# Patient Record
Sex: Female | Born: 1955 | Race: White | Hispanic: No | Smoking: Current every day smoker
Health system: Southern US, Community
[De-identification: ages and names within clinical notes are randomized; demographics above are authoritative.]

## PROBLEM LIST (undated history)

## (undated) DIAGNOSIS — I499 Cardiac arrhythmia, unspecified: Secondary | ICD-10-CM

## (undated) DIAGNOSIS — R51 Headache: Secondary | ICD-10-CM

## (undated) DIAGNOSIS — R112 Nausea with vomiting, unspecified: Secondary | ICD-10-CM

## (undated) DIAGNOSIS — I82409 Acute embolism and thrombosis of unspecified deep veins of unspecified lower extremity: Secondary | ICD-10-CM

## (undated) DIAGNOSIS — F32A Depression, unspecified: Secondary | ICD-10-CM

## (undated) DIAGNOSIS — Z950 Presence of cardiac pacemaker: Secondary | ICD-10-CM

## (undated) DIAGNOSIS — F319 Bipolar disorder, unspecified: Secondary | ICD-10-CM

## (undated) DIAGNOSIS — I455 Other specified heart block: Secondary | ICD-10-CM

## (undated) DIAGNOSIS — R42 Dizziness and giddiness: Secondary | ICD-10-CM

## (undated) DIAGNOSIS — C539 Malignant neoplasm of cervix uteri, unspecified: Secondary | ICD-10-CM

## (undated) DIAGNOSIS — Z720 Tobacco use: Secondary | ICD-10-CM

## (undated) DIAGNOSIS — I2699 Other pulmonary embolism without acute cor pulmonale: Secondary | ICD-10-CM

## (undated) DIAGNOSIS — F419 Anxiety disorder, unspecified: Secondary | ICD-10-CM

## (undated) DIAGNOSIS — Z9889 Other specified postprocedural states: Secondary | ICD-10-CM

## (undated) DIAGNOSIS — F329 Major depressive disorder, single episode, unspecified: Secondary | ICD-10-CM

## (undated) DIAGNOSIS — R55 Syncope and collapse: Secondary | ICD-10-CM

## (undated) HISTORY — DX: Syncope and collapse: R55

## (undated) HISTORY — PX: TONSILLECTOMY: SUR1361

## (undated) HISTORY — PX: BACK SURGERY: SHX140

## (undated) HISTORY — DX: Other specified heart block: I45.5

## (undated) HISTORY — PX: POSTERIOR LUMBAR FUSION: SHX6036

## (undated) HISTORY — DX: Depression, unspecified: F32.A

## (undated) HISTORY — DX: Anxiety disorder, unspecified: F41.9

## (undated) HISTORY — PX: CERVICAL FUSION: SHX112

## (undated) HISTORY — DX: Dizziness and giddiness: R42

## (undated) HISTORY — DX: Tobacco use: Z72.0

---
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
## 1980-03-19 DIAGNOSIS — C539 Malignant neoplasm of cervix uteri, unspecified: Secondary | ICD-10-CM

## 1980-03-19 HISTORY — DX: Malignant neoplasm of cervix uteri, unspecified: C53.9

## 1980-03-19 HISTORY — PX: ABDOMINAL HYSTERECTOMY: SHX81

## 2005-09-11 ENCOUNTER — Encounter: Admission: RE | Admit: 2005-09-11 | Discharge: 2005-09-11 | Payer: Self-pay

## 2007-07-16 ENCOUNTER — Other Ambulatory Visit: Admission: RE | Admit: 2007-07-16 | Discharge: 2007-07-16 | Payer: Self-pay | Admitting: Family Medicine

## 2008-01-09 ENCOUNTER — Ambulatory Visit: Payer: Self-pay | Admitting: Family Medicine

## 2008-01-10 ENCOUNTER — Encounter: Payer: Self-pay | Admitting: Family Medicine

## 2008-01-15 ENCOUNTER — Ambulatory Visit (HOSPITAL_COMMUNITY): Admission: RE | Admit: 2008-01-15 | Discharge: 2008-01-16 | Payer: Self-pay | Admitting: Orthopedic Surgery

## 2008-03-15 ENCOUNTER — Encounter: Admission: RE | Admit: 2008-03-15 | Discharge: 2008-03-15 | Payer: Self-pay | Admitting: Orthopedic Surgery

## 2008-07-26 ENCOUNTER — Ambulatory Visit: Payer: Self-pay | Admitting: Family

## 2008-07-27 ENCOUNTER — Encounter: Payer: Self-pay | Admitting: Family

## 2008-07-27 LAB — CONVERTED CEMR LAB
Clue Cells Wet Prep HPF POC: NONE SEEN
Trich, Wet Prep: NONE SEEN
Yeast Wet Prep HPF POC: NONE SEEN

## 2008-12-20 ENCOUNTER — Encounter: Admission: RE | Admit: 2008-12-20 | Discharge: 2008-12-20 | Payer: Self-pay | Admitting: Orthopedic Surgery

## 2009-05-09 ENCOUNTER — Encounter: Admission: RE | Admit: 2009-05-09 | Discharge: 2009-05-09 | Payer: Self-pay | Admitting: Orthopedic Surgery

## 2009-05-09 ENCOUNTER — Ambulatory Visit: Payer: Self-pay | Admitting: Occupational Medicine

## 2009-05-09 ENCOUNTER — Ambulatory Visit: Payer: Self-pay | Admitting: Advanced Practice Midwife

## 2009-05-09 DIAGNOSIS — M5430 Sciatica, unspecified side: Secondary | ICD-10-CM | POA: Insufficient documentation

## 2009-05-10 ENCOUNTER — Encounter: Payer: Self-pay | Admitting: Obstetrics and Gynecology

## 2009-11-14 ENCOUNTER — Ambulatory Visit: Payer: Self-pay | Admitting: Family Medicine

## 2009-11-14 DIAGNOSIS — H9209 Otalgia, unspecified ear: Secondary | ICD-10-CM | POA: Insufficient documentation

## 2009-11-14 DIAGNOSIS — H8110 Benign paroxysmal vertigo, unspecified ear: Secondary | ICD-10-CM | POA: Insufficient documentation

## 2009-11-14 DIAGNOSIS — H6120 Impacted cerumen, unspecified ear: Secondary | ICD-10-CM | POA: Insufficient documentation

## 2009-11-14 DIAGNOSIS — F411 Generalized anxiety disorder: Secondary | ICD-10-CM | POA: Insufficient documentation

## 2010-02-21 ENCOUNTER — Ambulatory Visit: Payer: Self-pay | Admitting: Obstetrics & Gynecology

## 2010-02-28 ENCOUNTER — Encounter
Admission: RE | Admit: 2010-02-28 | Discharge: 2010-02-28 | Payer: Self-pay | Source: Home / Self Care | Attending: Obstetrics & Gynecology | Admitting: Obstetrics & Gynecology

## 2010-04-18 NOTE — Assessment & Plan Note (Signed)
Summary: LBP - right side x 4 dys rm 3   Vital Signs:  Patient Profile:   55 Years Old Female CC:      LBP - R side x 4 dys Height:     68 inches Weight:      119 pounds O2 Sat:      100 % O2 treatment:    Room Air Temp:     97.5 degrees F oral Pulse rate:   74 / minute Pulse rhythm:   regular Resp:     16 per minute BP sitting:   126 / 85  (right arm) Cuff size:   regular  Vitals Entered By: Areta Haber CMA (May 09, 2009 2:37 PM)                  Current Allergies: No known allergies History of Present Illness Chief Complaint: LBP - R side x 4 dys History of Present Illness: Presents with complaints of right SI joint pain for about 4 days.  She does have some right leg pain that radiates into her right lateral calf intermittently.   Pain has gotten progressively worse.  Pain is worse with motion.   Better with lying down.  No history of fall or trauma.   She says several days ago she was painting and thinks she may have pulled something.    She does have a history of lumbar fusion done by Dr. Shon Baton in 10/09.    Takes no medications regularly for pain.     She does have a history of anxieth and panic attacks.  Takes vallium, paxil, and ambien.    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  Take PC CYCLOBENZAPRINE HCL 10 MG TABS (CYCLOBENZAPRINE HCL) one tablet at bedtime for muscle spasms  REVIEW OF SYSTEMS Constitutional Symptoms      Denies fever, chills, night sweats, weight loss, weight gain, and fatigue.  Eyes       Denies change in vision, eye pain, eye discharge, glasses, contact lenses, and eye surgery. Ear/Nose/Throat/Mouth       Denies hearing loss/aids, change in hearing, ear pain, ear discharge, dizziness, frequent runny nose,  frequent nose bleeds, sinus problems, sore throat, hoarseness, and tooth pain or bleeding.  Respiratory       Denies dry cough, productive cough, wheezing, shortness of breath, asthma, bronchitis, and emphysema/COPD.  Cardiovascular       Denies murmurs, chest pain, and tires easily with exhertion.    Gastrointestinal       Denies stomach pain, nausea/vomiting, diarrhea, constipation, blood in bowel movements, and indigestion. Genitourniary       Denies painful urination, kidney stones, and loss of urinary control. Neurological       Denies paralysis, seizures, and fainting/blackouts. Musculoskeletal       Complains of decreased range of motion.      Denies muscle pain, joint pain, joint stiffness, redness, swelling, muscle weakness, and gout.      Comments: LBP - R side x 4 dys Skin       Denies bruising, unusual mles/lumps or sores, and hair/skin or nail changes.  Psych       Denies mood changes, temper/anger issues, anxiety/stress, speech problems, depression, and sleep problems. Other Comments:  Pt states that she was painting a room in her home and starting feeling sore on Wednesday night 05/04/09. The pain has gradually gotten worse.    Past History:  Past Medical History: Last updated: 01/09/2008 Spinal problems  Past Surgical History: Last updated: 01/09/2008 Partial Hysterectomy  Family History: Last updated: 01/09/2008 Mother, Alive, Healthy Father, D, Eye Ca Sister, Alive, Healthy Brother, Alive, Healthy  Social History: Last updated: 01/09/2008 1/2PPD for 30 yrs Alcohol use-no Drug use-no Physical Exam General appearance: well developed, well nourished, no acute distress Chest/Lungs: no rales, wheezes, or rhonchi bilateral, breath sounds equal without effort Heart: regular rate and  rhythm, no murmur Back: tender musculature right lower back, straight leg raises negative on the left.  POSITIVE on the right, deep tendon reflexes 2+ at achilles and patella on  the left and right patella.   Slightly diminished achilles reflex on the right.   Normal senasation.  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  Take PC  #14 x 0, 05/09/2009, Kathrine Haddock MD  New Orders: New Patient Level III [99203] Ketorolac-Toradol 15mg  [G2952] Admin of Therapeutic Inj  intramuscular or subcutaneous [96372] Planning Comments:   No signs or symptoms of immediate neurological compromise Toradol Injection 60mg  IM now Prednisone taper Flexeril at beditme Follow up with Dr. Shon Baton next week.   The patient and/or caregiver has been counseled thoroughly with regard to medications prescribed including dosage, schedule, interactions, rationale for use, and possible side effects and they verbalize understanding.  Diagnoses and expected course of recovery discussed and will return if not improved as expected or if the condition worsens. Patient and/or caregiver verbalized understanding.  Prescriptions: CYCLOBENZAPRINE HCL 10 MG TABS (CYCLOBENZAPRINE HCL) one tablet at bedtime for muscle spasms  #20 x 0   Entered and Authorized by:   Kathrine Haddock MD   Signed by:   Kathrine Haddock MD on 05/09/2009   Method used:   Print then Give to Patient   RxID:   [id number] PREDNISONE 10 MG TABS (PREDNISONE) 2 PO BID for 2 days, then 1 BID for 2 days, then 1 daily for 2 days.  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

## 2010-04-18 NOTE — Assessment & Plan Note (Signed)
Summary: Ear pain- R worse x 3 wks, Nausea, dizzy x 2 dys rm 3   Vital Signs:  Patient Profile:   55 Years Old Female CC:      Ear pain - B, x 3 wksNausea, dizzy x 2 dys Height:     67 inches Weight:      120 pounds O2 Sat:      100 % O2 treatment:    Room Air Temp:     98.6 degrees F oral Pulse rate:   76 / minute Pulse rhythm:   regular Resp:     16 per minute BP sitting:   115 / 77  (left arm) Cuff size:   small  Vitals Entered By: Areta Haber CMA (November 14, 2009 6:45 PM)                  Current Allergies: No known allergies History of Present Illness Chief Complaint: Ear pain - B, x 3 wksNausea, dizzy x 2 dys History of Present Illness:  Subjective:  Patient complains of 3 week history of right ear feeling clogged, then 3 days ago she developed increasing pain in the ear.  Yesterday she developed dizziness (sensation of spinning).  She also has had sweats.  No nasal congestion or sore throat.  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  Eyes       Denies change in vision, eye pain, eye discharge, glasses, contact lenses, and eye surgery. Ear/Nose/Throat/Mouth       Complains of ear pain and dizziness.      Denies hearing loss/aids, change in hearing, ear discharge, frequent runny nose, frequent nose bleeds, sinus problems, sore throat, hoarseness, and tooth pain or bleeding.      Comments:  B, R worse x 3 wks Respiratory       Denies dry cough, productive cough, wheezing, shortness of breath, asthma, bronchitis, and emphysema/COPD.  Cardiovascular       Denies murmurs, chest pain, and tires easily with exhertion.    Gastrointestinal       Complains of nausea/vomiting.      Denies stomach pain, diarrhea, constipation, blood in bowel movements, and indigestion.      Comments: Nausea x 2 dys Genitourniary       Denies painful urination, kidney stones, and loss of urinary control. Neurological       Denies paralysis, seizures, and fainting/blackouts. Musculoskeletal       Denies muscle pain, joint pain, joint stiffness, decreased range of motion, redness, swelling, muscle weakness, and gout.  Skin       Denies bruising, unusual mles/lumps or sores, and hair/skin or nail changes.  Psych       Denies mood changes, temper/anger issues, anxiety/stress, speech problems, depression, and sleep problems. Other Comments: Pt has not seen her PCP for this.   Past History:  Past Surgical History: Last updated: 01/09/2008 Partial Hysterectomy  Family History: Last updated: 01/09/2008  Mother, Alive, Healthy Father, D, Eye Ca Sister, Alive, Healthy Brother, Alive, Healthy  Social History: Last updated: 11/14/2009 1/2PPD for 30 yrs Alcohol use-no Drug use-no Current Smoker - 1 pack daily Regular exercise-no  Past Medical History: Spinal problems Anxiety  Social History: 1/2PPD for 30 yrs Alcohol use-no Drug use-no Current Smoker - 1 pack daily Regular exercise-no Smoking Status:  current Does Patient Exercise:  no   Objective:  No acute distress  Eyes:  Pupils are equal, round, and reactive to light and accomdation.  Extraocular movement is intact.  Conjunctivae are not inflamed.  Fundi benign.  No nystagmus Ears:  Left tympanic membrane and canal normal.  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  Unable to visualize right tympanic membrane.   No temporomandibular joint tenderness Nose:  Normal septum.  Normal turbinates, mildly congested.   No sinus tenderness present.  Pharynx:  Normal  Neck:  Supple.  No adenopathy is present.   Assessment New Problems: BENIGN POSITIONAL VERTIGO (ICD-386.11) CERUMEN IMPACTION, RIGHT (ICD-380.4) EAR PAIN, RIGHT (ICD-388.70) ANXIETY (ICD-300.00)  SUSPECT RIGHT OTITIS MEDIA BEHIND CERUMIN IMPACTION  Plan New Medications/Changes: MECLIZINE HCL 25 MG TABS (MECLIZINE HCL) One by mouth two times a day to three times a day as needed for dizziness  #15 x 1, 11/14/2009, Donna Christen MD AMOXICILLIN 875 MG TABS (AMOXICILLIN) One by mouth two times a day  #20 x 0, 11/14/2009, Donna Christen MD  New Orders: Est. Patient Level III [phone] Planning Comments:   Begin amoxicillin and meclizine.   Return in 4 to 5 days for ear lavage when pain has resolved.  Return for worsening symptoms   The patient and/or caregiver has been counseled thoroughly with regard to medications prescribed including dosage, schedule, interactions, rationale for use, and possible side effects and they verbalize understanding.  Diagnoses and expected course of recovery discussed and will return if not improved as expected or if the condition worsens. Patient and/or caregiver verbalized understanding.  Prescriptions: MECLIZINE HCL 25 MG TABS (MECLIZINE HCL) One by mouth two times a day to three times a day as needed for dizziness  #15 x 1   Entered and Authorized by:   Donna Christen MD   Signed by:   Donna Christen MD on 11/14/2009   Method used:   Print then Give to Patient   RxID:   [id number] AMOXICILLIN 875 MG TABS (AMOXICILLIN) One by mouth two times a day  #20 x 0   Entered and Authorized by:   Donna Christen MD   Signed by:   Donna Christen MD on 11/14/2009   Method used:   Print then Give to Patient   RxID:   [id number]   Orders Added: 1)  Est. Patient Level III [29528]  Appended Document: Ear pain- R  worse x 3 wks, Nausea, dizzy x 2 dys rm 3 F/U cll to pt  - Pt states dizzy is no better, have not had ears cleaned yet. Pt states that Dr. Laurey Morale told her that she could come in Saturday to have ears cleaned. Pt advised to f/u w/ PCP since her dizzy is no better, to have her ears looked at [facility] if necessary) and for possible referral to ENT. Pt states she would call her PCP tomorrow.

## 2010-04-18 NOTE — Assessment & Plan Note (Signed)
Summary: BLADDER INFECTION   Vital Signs:  Patient Profile:   55 Years Old Female CC:      Grequency in urination X 2 days Height:     68 inches Weight:      126 pounds O2 Sat:      99 % O2 treatment:    Room Air Temp:     97.3 degrees F oral Pulse rate:   68 / minute Pulse rhythm:   regular Resp:     16 per minute BP sitting:   127 / 85  (left arm) Cuff size:   regular  Pt. in pain?   no                   Current Allergies (reviewed today): No known allergies  History of Present Illness Chief Complaint: Grequency in urination X 2 days   REVIEW OF SYSTEMS Constitutional Symptoms      Denies fever, chills, night sweats, weight loss, weight gain, and fatigue.  Eyes       Denies change in vision, eye pain, eye discharge, glasses, contact lenses, and eye surgery. Ear/Nose/Throat/Mouth       Denies hearing loss/aids, change in hearing, ear pain, ear discharge, dizziness, frequent runny nose, frequent nose bleeds, sinus problems, sore throat, hoarseness, and tooth pain or bleeding.  Respiratory       Denies dry cough, productive cough, wheezing, shortness of breath, asthma, bronchitis, and emphysema/COPD.  Cardiovascular       Denies murmurs, chest pain, and tires easily with exhertion.    Gastrointestinal       Denies stomach pain, nausea/vomiting, diarrhea, constipation, blood in bowel movements, and indigestion. Genitourniary       Denies painful urination, kidney stones, and loss of urinary control.      Comments: Frequency Neurological       Denies paralysis, seizures, and fainting/blackouts. Musculoskeletal       Denies muscle pain, joint pain, joint stiffness, decreased range of motion, redness, swelling, muscle weakness, and gout.  Skin       Denies bruising, unusual mles/lumps or sores, and hair/skin or nail changes.  Psych       Denies mood changes, temper/anger issues, anxiety/stress, speech problems, depression, and sleep problems.  Past Medical  History:    Spinal problems  Past Surgical History:    Partial Hysterectomy   Family History:    Mother, Alive, Healthy    Father, D, Eye Ca    Sister, Alive, Healthy    Brother, Alive, Healthy  Social History:    1/2PPD for 30 yrs    Alcohol use-no    Drug use-no      Plan  The patient and/or caregiver has been counseled thoroughly with regard to medications prescribed including dosage, schedule, interactions, rationale for use, and possible side effects and they verbalize understanding.  Diagnoses and expected course of recovery discussed and will return if not improved as expected or if the condition worsens. Patient and/or caregiver verbalized understanding.     ] ]

## 2010-07-11 HISTORY — PX: TRANSTHORACIC ECHOCARDIOGRAM: SHX275

## 2010-07-18 ENCOUNTER — Ambulatory Visit: Payer: Self-pay | Admitting: Obstetrics and Gynecology

## 2010-08-01 NOTE — Assessment & Plan Note (Signed)
[name], [name]                ACCOUNT NO.:  [id number]   MEDICAL RECORD NO.:  [id number]          PATIENT TYPE:  POB   LOCATION:  [facility]         FACILITY:  [facility]   PHYSICIAN:  Sid Falcon, CNM  DATE OF BIRTH:  September 03, 1955   DATE OF SERVICE:  07/26/2008                                  CLINIC NOTE   The patient is here with a chief complaint of vaginal discharge with  odor x3 days.   CURRENT MEDICATIONS:  Paxil and Ambien 12.5 at bedtime.   MENSTRUAL HISTORY:  Menarche 55 years of age.   GYNECOLOGIC HISTORY:  Last Pap smear in 2009, history of abnormal Pap  smears, yes.  The patient reports questionable cervical cancer and had a  partial hysterectomy.  Ovaries are intact.  Last mammogram greater than  5 years ago and was normal.  Breast exam last year with [facility].   OBSTETRICAL HISTORY:  Gravida 3, para 3.   FAMILY HISTORY:  Maternal grandfather, diabetes.  Maternal grandfather,  heart disease, high blood pressure.  Maternal grandfather, cancer.  Maternal aunt, less than 17 years of age.  Denies any other significant  health problems in the family.   MEDICAL HISTORY:  Questionable cervical cancer.  No other medical  conditions identified or noted when questioned.   SOCIAL HISTORY:  The patient is adopted by her mother's sister, so her  maternal aunt adopted her as an infant.  Smokes approximately 1 pack of  cigarettes per day x30 years.  Caffeinated beverages, approximately 6  Pepsis per day.  Denies any alcohol.  Denies history of sexual or  physical abuse.   REVIEW OF SYSTEMS:  The patient reports intermittent numbness of  fingers, no abnormal hand lesions.  Muscle aches, fatigue, and weight  gain 6 months ago.  Does not experience any of these today.   PHYSICAL EXAMINATION:  GENERAL:  Alert and oriented x3.  No signs of  acute distress.  PELVIC:  White creamy discharge noted at introitus.  No lesions.  No  abnormal redness.  Vaginal  canal, no abnormal lesions.  White adherent  discharge throughout.  Vaginal cuff intact.   LABORATORY DATA:  Wet prep.   ASSESSMENT:  Abnormal vaginal discharge.   PLAN:  Prescription given for metronidazole 500 mg 1 pill b.i.d. x7 days  to be filled when reports of vaginal discharge returned.  I advised the  patient to schedule a mammogram.  The patient verbalizes will do as soon  as  possible.  Also, advised for a followup well-woman exam here at the  office, and the patient verbalized will schedule ASAP.  Follow up sooner  if concerns prior to well-woman exam.      Sid Falcon, CNM     WM/MEDQ  D:  07/26/2008  T:  07/27/2008  Job:  [id number]

## 2010-08-01 NOTE — Op Note (Signed)
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   COMPLICATIONS:  None.   INSTRUMENTATION USED:  Metal-hydroxyapatite coated, size 11, 33-mm long  interbody TLIF cage with the [employer] Radius pedicle screw fixation on  the right side, 40-mm pedicle screw 6.25 diameter with a 50-mm rod and a  facet screw on the contralateral side, 30 mm in length.   HISTORY:  [name] is a very pleasant 55 year old woman who has been  having significant back and right leg pain for sometime now.  Despite  physical therapy, injection therapy, and narcotic medications, she is  still having horrific pain.  She presented to my office because of the  severity of her symptoms.  Having failed prolonged conservative  management, she elected to proceed with surgery.  All appropriate risks,  benefits, and alternatives were discussed and consent was obtained.   OPERATIVE NOTE:  The patient was brought to the operating room and  placed supine on the operating table.  After successful induction of  general anesthesia and endotracheal intubation, TEDs, SCDs, and Foley  were applied.  The arms were secured to the side.  The spinal frame was  secured over the patient.  She was turned into the prone position.   The arms were placed overhead.  All bony prominences  were well padded  and the back was prepped and draped in a standard fashion.   Fluoro was used to identify the appropriate incision site and a midline  lumbar incision was made starting at the superior aspect of L3 and  proceeding to the superior aspect of S1.  Sharp dissection was carried  out down to the deep fascia.  Deep fascia was sharply incised and then  using a Cobb elevator, I stripped the paraspinal muscles to expose the  L4 and L5 spinous processes as well as the L3-4 and L4-5 facet  complexes.  Once I had the facet complexes exposed, I then exposed out  to the L4 transverse and L5 transverse processes.  At this point, since  her right side was the most symptomatic side, this was elected to do the  TLIF decompression out.  Once I exposed this right hand side, I then  just exposed out to the L4-5 facet complex on the contralateral side.   At this point, I then used fluoro to guide my screw placement.  I first  identified the transverse process, traced its origin back to where it  intersected  with the facet complex and scored the cortex.  I then  advanced the pedicle probe through the pedicle and into the vertebral  body.  I repeated this at the L5 level.  AP and lateral x-rays at this  time with pedicle probes in place demonstrated satisfactory position.  I  then removed the pedicle probes, palpated with a ball-tip feeler and  then tapped.  I then palpated again with ball-tip feeler to ensure that  I had a solid bony canal.  With the solid bony canal at both the L4 and  L5 places, I placed a 40-mm, 6.25 diameter pedicle screws at both  levels.  The patient tolerated this well and repeat AP and lateral views  were satisfactory.  With the pedicle screw fixation complete, I then  proceeded with decompression.   I removed the complete L5 spinous process as well as the inferior  portion of the L4 spinous process.  I then performed a complete L5  central decompression by removing  the entire performing a laminectomy of  L5.  Once I had the central decompression completed, I then proceeded  out laterally.  I then took an osteotome and I resected the entire L4  inferior facet complex.  With the facet removed, I then developed a  plane underneath the pars of L4 and resected the entire pars of L4.  At  this point, I could clearly visualize the exiting L4 nerve root as well  as the traversing L5 nerve root.  I then trimmed down the superior facet  complex of L5 until I could visualize the medial wall of the pedicle.  I  then performed a generous foraminotomy of L5.  I can now directly  visualize the inferior and medial wall of the L4 and L5 pedicle and I  palpated them and directly looked at them to again confirm that the  pedicle screw that I had placed did not violate the pedicle.  At this  point, I also could clearly visualize the L4-5 disk space.  I then swept  the thecal sac, traversing L5 nerve root medially, and placed a Neuro-  Patty to protect the exiting L4 nerve root.  I then obtained hemostasis  by bipolar electrocautery.  Once the epidural veins were cauterized, I  then incised the L4-5 disk complex to perform the diskectomy.   Using a combination of pituitary rongeurs, curettes, and Kerrison  rongeurs, I resected the entire L4-5 disk.  I then at this point with  the diskectomy complete, I then distracted the interbody space with  paddle distractor sequentially up to a size 13.  With the interbody  space distracted, I then placed my pedicle screw distractor and locked  into the pedicle screws and maintained the distraction.  This allowed me  to distract the interbody space without stressing the pedicle screws.  I  removed the paddle and again did a more extensive diskectomy.  Once the  endplates were prepped appropriately, I then measured the interbody  space and noted that the size 12 spacer was the best fit.  I then took  Actifuse, packed it in the  interbody space, and pushed anteriorly so  that it would fit in front of the graft.  I then took the local bone  graft mixed with Actifuse and packed the TLIF cage and malleted that to  the appropriate depth and position.  Once I had the appropriate depth  and position, I then irrigated the wound copiously with normal saline,  removed the distraction, and checked the nerve roots.  At this point, I  could directly visualize the L5 and L4 nerve root.  There was no free  fragments of bone, and there was no significant undue tension on the  nerve roots.  In addition, the foraminotomies were still widely patent.  At this point, I took a 50-mm rod pre-lordosed, secured it to the  pedicle screws, compressed the construct, and locked them into position.  On the contralateral side, I identified the L4-5 facet complex, stripped  it, packed bone in the facet complex, and then drilled across the facet  into the L5 pedicle.  This was done under direct visualization and  lateral fluoroscopy.  I then measured, palpated the hole with a ball-tip  feeler, and then placed a 30-mm facet transfacet interpedicular screw.  I had excellent fixation.   At this point with the fixation complete, I irrigated again copiously  with normal saline, obtained hemostasis with bipolar electrocautery, and  then I took a nerve root hockey stick and palpated along the L4 nerve  root superiorly, centrally, and along the traversing L5 nerve root and  out the L5 neural foramen.  Again, there was no undue tension on the  thecal sac or the L4 and L5 nerve root.  At this point, I then placed  FloSeal to maintain hemostasis, closed the deep fascia with interrupted  #1 Vicryl sutures, superficial 2-0 Vicryl sutures, and a 3-0 Monocryl  for the skin.  Steri-Strips and dry dressing were applied.  She was  extubated and transferred to PACU without incident.  At the end of the  case, all needle and sponge counts were correct.       Alvy Beal, MD  Electronically Signed    DDB/MEDQ  D:  01/15/2008  T:  01/16/2008  Job:  [id number]

## 2010-08-01 NOTE — Assessment & Plan Note (Signed)
[name], [name]                ACCOUNT NO.:  [id number]   MEDICAL RECORD NO.:  [id number]          PATIENT TYPE:  POB   LOCATION:  [facility] at [place]         FACILITY:  [facility]   PHYSICIAN:  Elsie Lincoln, MD      DATE OF BIRTH:  08/05/1955   DATE OF SERVICE:  02/21/2010                                  CLINIC NOTE   The patient is a 55 year old female who presents for yearly exam.  The  patient has no complaints.   PAST MEDICAL HISTORY:  The patient has had cervical cancer.  She has had  a hysterectomy 20 years ago in [place].  She has severe anxiety.   SURGICAL HISTORY:  Back surgery, hysterectomy as well as multiple cones  before the hysterectomy.   OBSTETRICAL HISTORY:  NSVD x3.   GYNECOLOGIC HISTORY:  Last Pap smear in 2003 and again cervical cancer  20 years ago.  The patient is sexually active.  No history of ovarian  cysts.  No history of incontinence.   MEDICATIONS:  Paxil, Ambien, and Valium.   ALLERGIES:  None.   SOCIAL HISTORY:  The patient is married to the security guard that works  here at [employer].  She does not work.  She has 3  children.  She smokes one pack per day.  The patient has cut down to 2  Pepsi per day, she used to drink 6.  She has never been physically or  sexually abused.   Systemic review is negative today except for problems with anxiety.   PHYSICAL EXAM:  VITAL SIGNS:  Pulse 69, blood pressure 118/81, weight  124, height 60 inches.  GENERAL:  Well nourished, well developed, no apparent stress.  HEENT:  Normocephalic, atraumatic.  Good dentition.  Thyroid, no masses.  LUNGS:  Clear to auscultation bilaterally.  HEART:  Regular rate and rhythm.  BREASTS:  No masses, nontender.  ABDOMEN:  Soft, nontender.  No organomegaly.  No hernia.  GENITALIA:  Tanner V.  Vulva clean, no lesion.  Vagina is slightly  atrophic.  Cervix is absent.  Small nodule at the vaginal apex on  bimanual, it is flesh colored, most likely a  scar tissue.  It is at the  corner where most likely the stitches were tied.  Care was taken to make  sure the Pap smear was taken of this area.  It is flesh colored and it  stills to be at the corner of the vaginal apex.  On bimanual, the cervix  is surgically absent.  The ovaries are not palpated.  The patient  refused rectal exam.  EXTREMITIES:  No edema.   ASSESSMENT/PLAN:  A 55 year old female for well-woman exam.  1. Pap smear done.  2. Need to follow up on mammogram, which the patient states has been      done.  3. Calcium supplementation discussed.  The patient to do 1200 mg a day      with vitamin D.  4. Return to clinic in a year.           ______________________________  Elsie Lincoln, MD     KL/MEDQ  D:  02/21/2010  T:  02/22/2010  Job:  [id number]

## 2010-09-25 ENCOUNTER — Ambulatory Visit
Admission: RE | Admit: 2010-09-25 | Discharge: 2010-09-25 | Disposition: A | Discharge status: Home / Self Care | Payer: Self-pay | Source: Ambulatory Visit | Attending: Orthopedic Surgery | Admitting: Orthopedic Surgery

## 2010-09-25 ENCOUNTER — Other Ambulatory Visit: Payer: Self-pay | Admitting: Orthopedic Surgery

## 2010-09-25 DIAGNOSIS — Z9889 Other specified postprocedural states: Secondary | ICD-10-CM

## 2010-09-25 DIAGNOSIS — M545 Low back pain, unspecified: Secondary | ICD-10-CM

## 2010-12-17 ENCOUNTER — Inpatient Hospital Stay (INDEPENDENT_AMBULATORY_CARE_PROVIDER_SITE_OTHER)
Admission: RE | Admit: 2010-12-17 | Discharge: 2010-12-17 | Disposition: A | Discharge status: Home / Self Care | Payer: Federal, State, Local not specified - PPO | Source: Ambulatory Visit | Attending: Family Medicine | Admitting: Family Medicine

## 2010-12-17 ENCOUNTER — Encounter: Payer: Self-pay | Admitting: Family Medicine

## 2010-12-17 DIAGNOSIS — J209 Acute bronchitis, unspecified: Secondary | ICD-10-CM

## 2010-12-19 LAB — TYPE AND SCREEN
ABO/RH(D): A POS
Antibody Screen: NEGATIVE

## 2010-12-19 LAB — BASIC METABOLIC PANEL
BUN: 4 — ABNORMAL LOW
CO2: 25
Calcium: 9.8
Chloride: 106
Creatinine, Ser: 0.8
GFR calc Af Amer: >60
GFR calc non Af Amer: >60
Glucose, Bld: 107 — ABNORMAL HIGH
Potassium: 5.2 — ABNORMAL HIGH
Sodium: 138

## 2010-12-19 LAB — CBC
HCT: 36
HCT: 43.9
Hemoglobin: 12.6
Hemoglobin: 14.6
MCHC: 33.2
MCHC: 34.8
MCV: 91.9
MCV: 93.8
Platelets: 272
Platelets: 281
RBC: 3.92
RBC: 4.68
RDW: 13.8
RDW: 14.3
WBC: 11.3 — ABNORMAL HIGH
WBC: 21.9 — ABNORMAL HIGH

## 2010-12-19 LAB — ABO/RH: ABO/RH(D): A POS

## 2011-02-19 NOTE — Progress Notes (Signed)
Summary: COUGH/CONGESTION x 4 days (room 4)   Vital Signs:  Patient Profile:   55 Years Old Female CC:      cold and cough x 4 days Height:     67 inches Weight:      112.50 pounds O2 Sat:      99 % O2 treatment:    Room Air Temp:     97.8 degrees F oral Pulse rate:   77 / minute Resp:     16 per minute BP sitting:   144 / 93  (left arm) Cuff size:   regular  Vitals Entered By: Lavell Islam RN (December 17, 2010 3:39 PM)                  Updated Prior Medication List: AMBIEN CR 12.5 MG CR-TABS (ZOLPIDEM TARTRATE) 1 hs * ZOPHION 1 HS  Current Allergies: No known allergies History of Present Illness Chief Complaint: cold and cough x 4 days History of Present Illness:  Subjective: Patient complains of onset of URI symptoms 4 days ago with initial mild sore throat (resolved), nasal congestion, and myalgias.  She smokes 1 pack per day for the past 30 years.  She notes that she is beginning to cough until she gags, and cough is generally non-productive and worse at night.   She does not remember her last tetanus immunization.  No pleuritic pain but has mild soreness in the anterior chest No wheezing + nasal congestion + post-nasal drainage No sinus pain/pressure No itchy/red eyes No earache No hemoptysis No SOB No fever; + chills/sweats No nausea No vomiting No abdominal pain No diarrhea No skin rashes + fatigue No headache Used OTC meds without relief   REVIEW OF SYSTEMS Constitutional Symptoms       Complains of fever, chills, and fatigue.     Denies night sweats, weight loss, and weight gain.  Eyes       Denies change in vision, eye pain, eye discharge, glasses, contact lenses, and eye surgery. Ear/Nose/Throat/Mouth       Complains of frequent runny nose, sore throat, and hoarseness.      Denies hearing loss/aids, change in hearing, ear pain, ear discharge, dizziness, frequent nose bleeds, sinus problems, and tooth pain or bleeding.  Respiratory  Complains of dry cough.      Denies productive cough, wheezing, shortness of breath, asthma, bronchitis, and emphysema/COPD.  Cardiovascular       Denies murmurs, chest pain, and tires easily with exhertion.    Gastrointestinal       Denies stomach pain, nausea/vomiting, diarrhea, constipation, blood in bowel movements, and indigestion. Genitourniary       Denies painful urination, kidney stones, and loss of urinary control. Neurological       Denies paralysis, seizures, and fainting/blackouts. Musculoskeletal       Complains of muscle pain and joint pain.      Denies joint stiffness, decreased range of motion, redness, swelling, muscle weakness, and gout.  Skin       Denies bruising, unusual mles/lumps or sores, and hair/skin or nail changes.  Psych       Denies mood changes, temper/anger issues, anxiety/stress, speech problems, depression, and sleep problems. Other Comments: cough and cold symptoms x 4 days   Past History:  Past Medical History: Reviewed history from 11/14/2009 and no changes required. Spinal problems Anxiety  Past Surgical History: Partial Hysterectomy Tonsillectomy back fusion  Family History: Reviewed history from 01/09/2008 and no changes required. Mother, Alive, Healthy  Father, D, Eye Ca Sister, Alive, Healthy Brother, Alive, Healthy  Social History: Reviewed history from 11/14/2009 and no changes required. 1/2PPD for 30 yrs Alcohol use-no Drug use-no Current Smoker - 1 pack daily Regular exercise-no   Objective:  Appearance:  Patient appears healthy, stated age, and in no acute distress  Eyes:  Pupils are equal, round, and reactive to light and accomodation.  Extraocular movement is intact.  Conjunctivae are not inflamed.  Ears:  Canals normal.  Tympanic membranes normal.   Nose:  Mildly congested turbinates.  No sinus tenderness  Pharynx:  Normal  Neck:  Supple.  No adenopathy is present.  No thyromegaly is present  Lungs:  Clear to  auscultation.  Breath sounds are equal.  Chest:  Mild tenderness over sternum Heart:  Regular rate and rhythm without murmurs, rubs, or gallops.  Abdomen:  Nontender without masses or hepatosplenomegaly.  Bowel sounds are present.  No CVA or flank tenderness.  Extremities:  No edema.   Assessment New Problems: BRONCHITIS, ACUTE (ICD-466.0)  ? PERTUSSIS  Plan New Medications/Changes: BENZONATATE 200 MG CAPS (BENZONATATE) One by mouth hs as needed cough  #12 x 0, 12/17/2010, Donna Christen MD AZITHROMYCIN 250 MG TABS (AZITHROMYCIN) Two tabs by mouth on day 1, then 1 tab daily on days 2 through 5  #6 tabs x 0, 12/17/2010, Donna Christen MD  New Orders: Pulse Oximetry [94760] Est. Patient Level III [16109] Planning Comments:   Begin Z-pack, expectorant/decongestant, cough suppressant at bedtime.  Increase fluid intake Recommend Tdap when well Followup with PCP if not improving 7 to 10 days   The patient and/or caregiver has been counseled thoroughly with regard to medications prescribed including dosage, schedule, interactions, rationale for use, and possible side effects and they verbalize understanding.  Diagnoses and expected course of recovery discussed and will return if not improved as expected or if the condition worsens. Patient and/or caregiver verbalized understanding.  Prescriptions: BENZONATATE 200 MG CAPS (BENZONATATE) One by mouth hs as needed cough  #12 x 0   Entered and Authorized by:   Donna Christen MD   Signed by:   Donna Christen MD on 12/17/2010   Method used:   Print then Give to Patient   RxID:   [id number] AZITHROMYCIN 250 MG TABS (AZITHROMYCIN) Two tabs by mouth on day 1, then 1 tab daily on days 2 through 5  #6 tabs x 0   Entered and Authorized by:   Donna Christen MD   Signed by:   Donna Christen MD on 12/17/2010   Method used:   Print then Give to Patient   RxID:   [id number]   Patient Instructions: 1)  Take Mucinex D (guaifenesin with  decongestant) twice daily for congestion. 2)  Increase fluid intake, rest. 3)  May use Afrin nasal spray (or generic oxymetazoline) twice daily for about 5 days.  Also recommend using saline nasal spray several times daily and/or saline nasal irrigation. 4)  Recommend Tdap when well. 5)  Followup with family doctor if not improving 7 to 10 days.   Orders Added: 1)  Pulse Oximetry [94760] 2)  Est. Patient Level III [29528]

## 2012-06-20 ENCOUNTER — Other Ambulatory Visit: Payer: Self-pay | Admitting: Gastroenterology

## 2012-07-02 ENCOUNTER — Other Ambulatory Visit: Payer: Self-pay | Admitting: Family Medicine

## 2012-07-02 DIAGNOSIS — R634 Abnormal weight loss: Secondary | ICD-10-CM

## 2012-07-04 ENCOUNTER — Inpatient Hospital Stay: Admission: RE | Admit: 2012-07-04 | Payer: Federal, State, Local not specified - PPO | Source: Ambulatory Visit

## 2012-07-07 ENCOUNTER — Ambulatory Visit
Admission: RE | Admit: 2012-07-07 | Discharge: 2012-07-07 | Disposition: A | Discharge status: Home / Self Care | Payer: Federal, State, Local not specified - PPO | Source: Ambulatory Visit | Attending: Family Medicine | Admitting: Family Medicine

## 2012-07-07 DIAGNOSIS — R634 Abnormal weight loss: Secondary | ICD-10-CM

## 2012-07-07 MED ORDER — IOHEXOL 300 MG/ML  SOLN
75.0000 mL | Freq: Once | INTRAMUSCULAR | Status: AC | PRN
  Administered 2012-07-07: 75 mL via INTRAVENOUS

## 2012-07-10 ENCOUNTER — Encounter (HOSPITAL_COMMUNITY): Payer: Self-pay

## 2012-07-10 ENCOUNTER — Ambulatory Visit (HOSPITAL_COMMUNITY): Admit: 2012-07-10 | Payer: Federal, State, Local not specified - PPO | Admitting: Cardiovascular Disease

## 2012-07-10 SURGERY — TILT TABLE STUDY
Anesthesia: LOCAL

## 2012-10-29 ENCOUNTER — Ambulatory Visit: Payer: Federal, State, Local not specified - PPO | Admitting: Cardiology

## 2012-10-29 ENCOUNTER — Ambulatory Visit (INDEPENDENT_AMBULATORY_CARE_PROVIDER_SITE_OTHER): Payer: Federal, State, Local not specified - PPO | Admitting: Cardiology

## 2012-10-29 ENCOUNTER — Encounter: Payer: Self-pay | Admitting: Cardiology

## 2012-10-29 ENCOUNTER — Encounter: Payer: Self-pay | Admitting: Cardiovascular Disease

## 2012-10-29 VITALS — BP 112/60 | HR 89 | Ht 67.0 in | Wt 110.8 lb

## 2012-10-29 DIAGNOSIS — Z87898 Personal history of other specified conditions: Secondary | ICD-10-CM

## 2012-10-29 DIAGNOSIS — F17200 Nicotine dependence, unspecified, uncomplicated: Secondary | ICD-10-CM

## 2012-10-29 DIAGNOSIS — Z720 Tobacco use: Secondary | ICD-10-CM

## 2012-10-29 DIAGNOSIS — R55 Syncope and collapse: Secondary | ICD-10-CM

## 2012-10-29 DIAGNOSIS — R42 Dizziness and giddiness: Secondary | ICD-10-CM

## 2012-10-29 DIAGNOSIS — Z9189 Other specified personal risk factors, not elsewhere classified: Secondary | ICD-10-CM

## 2012-10-29 MED ORDER — MECLIZINE HCL 25 MG PO TABS: 25.0000 mg | ORAL_TABLET | Freq: Three times a day (TID) | ORAL | Status: DC | PRN

## 2012-10-29 NOTE — Assessment & Plan Note (Signed)
Has had neuro work up in past for same symptoms.

## 2012-10-29 NOTE — Patient Instructions (Addendum)
We will schedule a loop recorder to evaluate her heart rate which may give [place] insight as to a cause for your syncope.  You will be NPO from midnight until the test is complete the day it is scheduled.  Try the meclizine for vertigo.  Take your own Xanax the morning of the loop recorder implant.  Begin cutting back on your tobacco use. Only outside to smoke.

## 2012-10-29 NOTE — Progress Notes (Signed)
10/29/2012   PCP: Thane Edu, MD   Chief Complaint  Patient presents with  . 2 episodes of almost passing out    profuse sweating, nausea, lightheaded/dizzy, and last episode 2 weeks ago had numb/tingling in fingertips; denies CP/SOB    Primary Cardiologist: Dr. Herbie Baltimore  HPI: 57 year old white married female presents today for near syncopal episode.  This occurred approximately 2 weeks ago. She was almost passing out with diaphoresis nausea, lightheaded, dizziness. On one of these episodes she was lying on a sofa when it occurred. She also had some tingling in her left arm with this episode.  She has had a full workup by a neurologist for her vertigo they could not find any etiology. She also has had a colonoscopy and endoscopy that has been normal in the last 9 months. She denies any recent weight loss that she is very thin. She states she eats appropriately.  Other history does include extreme anxiety so much so she did not want help for tilt study. Echocardiogram in April of 2012 with normal LV size and function EF greater than 55%.    Her weight today is 1 10 lbs. 12 oz. and in April she was 106 she tells me she is eating appropriately.     Allergies  Allergen Reactions  . Penicillins Other (See Comments)    Yeast infection    Current Outpatient Prescriptions  Medication Sig Dispense Refill  . ALPRAZolam (XANAX) 0.5 MG tablet Take 0.5 mg by mouth as needed.      . mirtazapine (REMERON) 30 MG tablet Take 30 mg by mouth at bedtime.      . sertraline (ZOLOFT) 100 MG tablet Take 100 mg by mouth daily.      . meclizine (ANTIVERT) 25 MG tablet Take 1 tablet (25 mg total) by mouth 3 (three) times daily as needed.  30 tablet  0   No current facility-administered medications for this visit.    Past Medical History  Diagnosis Date  . Syncope and collapse     echo-April 12,2012-EF 55% Echo normal  . Anxiety     anxiety and mild depressive order systoms  .  Vertigo   . Tobacco abuse     Past Surgical History  Procedure Laterality Date  . Transthoracic echocardiogram  07/11/2010    The left atrial size is normal.There is no evidence of mitral vavle prolaspe.Right ventriicular systolic pressure is normal . Injection of contrast documented no interatrial shunt. Essentially normal 2D echo -doppler study   . Partial hysterectomy  01/01/ 1982  . Back surgery  10/ 01/ 2009    ZOX:WRUEAVW:UJ colds or fevers, slight weight increase Skin:no rashes or ulcers HEENT:no blurred vision, no congestion CV:see HPI PUL:see HPI GI:no diarrhea constipation or melena, no indigestion GU:no hematuria, no dysuria MS:no joint pain, no claudication Neuro:+ near syncope, + dizziness Endo:no diabetes, no thyroid disease  PHYSICAL EXAM BP 112/60  Pulse 89  Ht 5\' 7"  (1.702 m)  Wt 110 lb 12.8 oz (50.259 kg)  BMI 17.35 kg/m2 General:Pleasant affect, NAD, very thin, anxious Skin:Warm and dry, brisk capillary refill HEENT:normocephalic, sclera clear, mucus membranes moist Neck:supple, no JVD, no bruits  Heart:S1S2 RRR without murmur, gallup, rub or click Lungs:clear without rales, rhonchi, or wheezes WJX:BJYN, non tender, + BS, do not palpate liver spleen or masses Ext:no lower ext edema, 2+ pedal pulses, 2+ radial pulses Neuro:alert and oriented, MAE, follows commands, + facial symmetry, EOMs intact, upper ext. Strength with  push pull equal and strong, fingers are strong, Lower ext. push pull equal and strong.    EKG:SR, no acute EKG changes.    ASSESSMENT AND PLAN Near syncope Patient has had 2 more episodes of near syncope. The most recent she was lying on the sofa and had a feeling that came over her as if she was going to pass out.  It had been recommended she have a tilt study in the past she is very anxious about it because she will have to stop her Xanax and her other medications. Due to this episode while she's lying down, I discussed with Dr. Royann Shivers  and we will at this point implant loop recorder.  She is agreeable to the implant and she will take her Xanax prior to coming in that day with a sip of water.  If she has no arrhythmias at that point we would decide that she would need to tilt study.  Hx of syncope Has had neuro work up in past for same symptoms.  Vertigo Unknown source of her vertigo she did run out of meclizine so I refilled this medication for her.  Tobacco abuse She smokes a pack a day, she tried Chantix without any help. We discussed Nicoderm patch but she was not interested. She is expecting a grandchild in the very near future and her daughter is told her she spell out to smoke around a grandchild.  We discussed 3-5 minutes ,ways to at least decrease the tobacco use.  Asked her to keep her cigarettes her car so that she has to go outside of her home to smoke and maybe that will help at least Cut her use from one pack to half a pack a day. She will then decide which are her most tasty 7 cigarettes per day and cut back to 7 cigarettes.  Then she will decrease monthly.   She also noted that she is adopted and does not know family history except a brother that had coronary disease. Her daughter who is currently pregnant lost a child at 4 months pregnant last year and genetic testing was done.  There was some type of heart problem though the patient cannot give me any more details than that.  She was concerned that she herself may have coronary disease though she really has no chest pain no shortness of breath.  Once we were able to help her with her syncope at that point she may need a stress test to evaluate coronary artery disease. Again she was reminded that stopping tobacco would help prevent coronary artery disease.

## 2012-10-29 NOTE — Assessment & Plan Note (Signed)
Unknown source of her vertigo she did run out of meclizine so I refilled this medication for her.

## 2012-10-29 NOTE — Assessment & Plan Note (Signed)
She smokes a pack a day, she tried Chantix without any help. We discussed Nicoderm patch but she was not interested. She is expecting a grandchild in the very near future and her daughter is told her she spell out to smoke around a grandchild.  We discussed 3-5 minutes ,ways to at least decrease the tobacco use.  Asked her to keep her cigarettes her car so that she has to go outside of her home to smoke and maybe that will help at least Cut her use from one pack to half a pack a day. She will then decide which are her most tasty 7 cigarettes per day and cut back to 7 cigarettes.  Then she will decrease monthly.

## 2012-10-29 NOTE — Assessment & Plan Note (Signed)
Patient has had 2 more episodes of near syncope. The most recent she was lying on the sofa and had a feeling that came over her as if she was going to pass out.  It had been recommended she have a tilt study in the past she is very anxious about it because she will have to stop her Xanax and her other medications. Due to this episode while she's lying down, I discussed with Dr. Royann Shivers and we will at this point implant loop recorder.  She is agreeable to the implant and she will take her Xanax prior to coming in that day with a sip of water.  If she has no arrhythmias at that point we would decide that she would need to tilt study.

## 2012-10-30 ENCOUNTER — Encounter (HOSPITAL_COMMUNITY): Payer: Self-pay | Admitting: Pharmacy Technician

## 2012-11-05 ENCOUNTER — Other Ambulatory Visit: Payer: Self-pay | Admitting: *Deleted

## 2012-11-05 ENCOUNTER — Other Ambulatory Visit: Payer: Self-pay | Admitting: Cardiovascular Disease

## 2012-11-06 ENCOUNTER — Ambulatory Visit (HOSPITAL_COMMUNITY)
Admission: RE | Admit: 2012-11-06 | Discharge: 2012-11-06 | Disposition: A | Discharge status: Home / Self Care | Payer: Federal, State, Local not specified - PPO | Source: Ambulatory Visit | Attending: Cardiovascular Disease | Admitting: Cardiovascular Disease

## 2012-11-06 ENCOUNTER — Encounter (HOSPITAL_COMMUNITY)
Admission: RE | Disposition: A | Discharge status: Home / Self Care | Payer: Self-pay | Source: Ambulatory Visit | Attending: Cardiovascular Disease

## 2012-11-06 DIAGNOSIS — F17200 Nicotine dependence, unspecified, uncomplicated: Secondary | ICD-10-CM | POA: Insufficient documentation

## 2012-11-06 DIAGNOSIS — Z79899 Other long term (current) drug therapy: Secondary | ICD-10-CM | POA: Insufficient documentation

## 2012-11-06 DIAGNOSIS — R55 Syncope and collapse: Secondary | ICD-10-CM | POA: Insufficient documentation

## 2012-11-06 DIAGNOSIS — F411 Generalized anxiety disorder: Secondary | ICD-10-CM | POA: Insufficient documentation

## 2012-11-06 DIAGNOSIS — Z8249 Family history of ischemic heart disease and other diseases of the circulatory system: Secondary | ICD-10-CM | POA: Insufficient documentation

## 2012-11-06 DIAGNOSIS — Z880 Allergy status to penicillin: Secondary | ICD-10-CM | POA: Insufficient documentation

## 2012-11-06 HISTORY — PX: LOOP RECORDER IMPLANT: SHX5477

## 2012-11-06 SURGERY — LOOP RECORDER IMPLANT
Anesthesia: LOCAL

## 2012-11-06 NOTE — H&P (Signed)
Date of Initial H&P: 10/29/2012  History reviewed, patient examined, no change in status, stable for surgery. Here for loop recorder implantation for recurrent syncope. This procedure has been fully reviewed with the patient and written informed consent has been obtained. Thurmon Fair, MD, [facility] [phone] office [phone] pager

## 2012-11-06 NOTE — Op Note (Signed)
LOOP RECORDER IMPLANT   Procedure report  Procedure performed:  1. Loop recorder implantation  Reason for procedure:  1. Recurrent syncope/near-syncope Procedure performed by:  Thurmon Fair, MD  Complications:  None  Estimated blood loss:  <5 mL  Device details:  Medtronic Reveal Linq model number [id number], serial number [id number] Procedure details:  After the risks and benefits of the procedure were discussed the patient provided informed consent. He was brought to the cardiac catheter lab in the fasting state. The patient was prepped and draped in usual sterile fashion. Local anesthesia with 1% lidocaine was administered to an area 2 cm to the left of the sternum in the 4th intercostal space. A horizontal incision was made using the incision tool. The introducer was then used to create a subcutaneous tunnel and carefully deploy the device. Local pressure was held to ensure hemostasis. Device testing showed excellent sensing 1.3-1.5 mV R waves. The incision was closed with SteriStrips and a sterile dressing was applied.   Thurmon Fair, MD, [facility] [phone] office [phone] pager 11/06/2012 9:28 AM

## 2012-11-11 ENCOUNTER — Ambulatory Visit (INDEPENDENT_AMBULATORY_CARE_PROVIDER_SITE_OTHER): Payer: Federal, State, Local not specified - PPO | Admitting: Cardiology

## 2012-11-11 ENCOUNTER — Encounter: Payer: Self-pay | Admitting: Cardiology

## 2012-11-11 VITALS — BP 110/60 | HR 84

## 2012-11-11 DIAGNOSIS — F411 Generalized anxiety disorder: Secondary | ICD-10-CM

## 2012-11-11 DIAGNOSIS — Z9189 Other specified personal risk factors, not elsewhere classified: Secondary | ICD-10-CM

## 2012-11-11 DIAGNOSIS — Z87898 Personal history of other specified conditions: Secondary | ICD-10-CM

## 2012-11-11 DIAGNOSIS — Z720 Tobacco use: Secondary | ICD-10-CM

## 2012-11-11 DIAGNOSIS — F17200 Nicotine dependence, unspecified, uncomplicated: Secondary | ICD-10-CM

## 2012-11-11 NOTE — Patient Instructions (Signed)
No standing water. Your physician recommends that you schedule a follow-up appointment in:one month with Dr Herbie Baltimore

## 2012-11-11 NOTE — Progress Notes (Signed)
11/11/2012 [name]   1955-06-12  [id number]  Primary Physicia Thane Edu, MD Primary Cardiologist: Dr Herbie Baltimore  HPI:  57 y/o seen by Dr Herbie Baltimore for syncope. She was unable to do a tilt table test secondary to anxiety. She has had a negative neurologic work up and has normal LVF by echo. She is seen today after she had a loop recorder implanted 11/06/12. She has had no complaints, and no syncopal spells.   Current Outpatient Prescriptions  Medication Sig Dispense Refill  . ALPRAZolam (XANAX) 0.5 MG tablet Take 0.5 mg by mouth as needed for anxiety.       . meclizine (ANTIVERT) 25 MG tablet Take 25 mg by mouth 3 (three) times daily as needed for dizziness.      . mirtazapine (REMERON) 30 MG tablet Take 30 mg by mouth at bedtime.      . sertraline (ZOLOFT) 100 MG tablet Take 100 mg by mouth daily.       No current facility-administered medications for this visit.    Allergies  Allergen Reactions  . Penicillins Other (See Comments)    Yeast infection    History   Social History  . Marital Status: Married    Spouse Name: N/A    Number of Children: N/A  . Years of Education: N/A   Occupational History  . Not on file.   Social History Main Topics  . Smoking status: Current Every Day Smoker -- 0.50 packs/day for 36 years    Types: Cigarettes  . Smokeless tobacco: Never Used  . Alcohol Use: No  . Drug Use: No  . Sexual Activity: Not on file   Other Topics Concern  . Not on file   Social History Narrative   Married 29 years . Has 3 children. Current smoker -1 pack a day-for 36 years .Alcohol :  1 beer on occasion           Review of Systems: General: negative for chills, fever, night sweats or weight changes.  Cardiovascular: negative for chest pain, dyspnea on exertion, edema, orthopnea, palpitations, paroxysmal nocturnal dyspnea or shortness of breath Dermatological: negative for rash Respiratory: negative for cough or wheezing Urologic: negative for  hematuria Abdominal: negative for nausea, vomiting, diarrhea, bright red blood per rectum, melena, or hematemesis Neurologic: negative for visual changes, syncope, or dizziness All other systems reviewed and are otherwise negative except as noted above.    Blood pressure 110/60, pulse 84.  General appearance: alert, cooperative, cachectic and no distress Lungs: clear to auscultation bilaterally Heart: regular rate and rhythm, S1, S2 normal, no murmur, click, rub or gallop Loop recorder site without signs of infection. Steri strips removed    ASSESSMENT AND PLAN:   Hx of syncope- s/p Loop recorder 11/06/12 No further episodes  ANXIETY Chronic  Tobacco abuse Counseled, she has cut back   PLAN  Steri strips removed. Follow up with Dr Herbie Baltimore.  Shakeera Rightmyer KPA-C 11/11/2012 12:08 PM

## 2012-11-11 NOTE — Assessment & Plan Note (Signed)
Chronic. 

## 2012-11-11 NOTE — Assessment & Plan Note (Signed)
No further episodes

## 2012-11-11 NOTE — Assessment & Plan Note (Signed)
Counseled, she has cut back

## 2012-11-13 ENCOUNTER — Ambulatory Visit: Payer: Self-pay | Admitting: Nurse Practitioner

## 2012-12-12 DIAGNOSIS — R55 Syncope and collapse: Secondary | ICD-10-CM

## 2012-12-16 ENCOUNTER — Ambulatory Visit (INDEPENDENT_AMBULATORY_CARE_PROVIDER_SITE_OTHER): Payer: Federal, State, Local not specified - PPO | Admitting: Cardiology

## 2012-12-16 ENCOUNTER — Encounter: Payer: Self-pay | Admitting: Cardiology

## 2012-12-16 VITALS — BP 138/80 | HR 64 | Ht 67.0 in | Wt 118.4 lb

## 2012-12-16 DIAGNOSIS — Z720 Tobacco use: Secondary | ICD-10-CM

## 2012-12-16 DIAGNOSIS — Z87898 Personal history of other specified conditions: Secondary | ICD-10-CM

## 2012-12-16 DIAGNOSIS — R42 Dizziness and giddiness: Secondary | ICD-10-CM

## 2012-12-16 DIAGNOSIS — F17200 Nicotine dependence, unspecified, uncomplicated: Secondary | ICD-10-CM

## 2012-12-16 DIAGNOSIS — Z9189 Other specified personal risk factors, not elsewhere classified: Secondary | ICD-10-CM

## 2012-12-16 NOTE — Assessment & Plan Note (Signed)
As I expected, her episodes are very few and far between.  Event monitor would not work.  The loop recorder is probably the best plan.  No further episodes she will just continue to monitor. She continues to take the meclizine for vertigo. She is on Zoloft which should potentially help of syncope, but is also potentially associated with syncope.  We will have to keep this in mind in the future evaluations if the loop recorder fails to reveal anything.

## 2012-12-16 NOTE — Patient Instructions (Signed)
Your physician wants you to follow-up in 6 MONTHS [place].  You will receive a reminder letter in the mail two months in advance. If you don't receive a letter, please call our office to schedule the follow-up appointment.  No changes with current medication

## 2012-12-16 NOTE — Assessment & Plan Note (Addendum)
5-7 minutes of counseling given.  Recommended Quitline  for recommendations.  She has cut back some, and is hoping to continue to gradually cut back until she is not smoking at all.  She just worried about how this will affect her anxiety.Marland Kitchen

## 2012-12-16 NOTE — Progress Notes (Signed)
PCP: Tabitha Edu, MD  Clinic Note: Chief Complaint  Patient presents with  . Follow-up     last visit april ,no sob , no chest pain,no edema   HPI: [name] is a 57 y.o. female with a PMH below who presents today for post loop recorder followup.  Interval History: She was seen by [name] back on August 26 as a work in for an episode of syncope led to her going for a loop recorder.  She essentially was watching TV at night and started at "going gray, but not quite black".  She denied actually losing consciousness but at her fingers feeling known, feeling nauseated and sweaty/clammy.  She did note feeling some palpitations. She did not tolerate the tilt table duty and anxiety.  Therefore the plan was for her to go for loop recorder placement.  This was implanted on August 21.  Since then however she is not any further episodes.  She is denies any recurrence of palpitations rapid heart beats irregular heartbeats.  No near-syncope or syncopal symptoms.  No TIA or amaurosis fugax symptoms.  The remainder of Cardiovascular ROS: no chest pain or dyspnea on exertion negative for - edema, loss of consciousness, murmur, orthopnea, palpitations, paroxysmal nocturnal dyspnea, rapid heart rate or shortness of breath: Additional cardiac review of systems: Lightheadedness - no, dizziness - no, syncope/near-syncope - no; TIA/amaurosis fugax - no Melena - no, hematochezia no; hematuria - no; nosebleeds - no; claudication - no  Loop Recorder: No incidents down noted.  Past Medical History  Diagnosis Date  . Syncope and collapse     echo-April 12,2012-EF 55% Echo normal  . Anxiety     anxiety and mild depressive order systoms  . Vertigo   . Tobacco abuse    as Prior Cardiac Evaluation and Past Surgical History: Past Surgical History  Procedure Laterality Date  . Transthoracic echocardiogram  07/11/2010    The left atrial size is normal.There is no evidence of mitral vavle  prolaspe.Right ventriicular systolic pressure is normal . Injection of contrast documented no interatrial shunt. Essentially normal 2D echo -doppler study   . Partial hysterectomy  01/01/ 1982  . Back surgery  10/ 01/ 2009    Allergies  Allergen Reactions  . Penicillins Other (See Comments)    Yeast infection    Current Outpatient Prescriptions  Medication Sig Dispense Refill  . ALPRAZolam (XANAX) 0.5 MG tablet Take 0.5 mg by mouth as needed for anxiety.       . meclizine (ANTIVERT) 25 MG tablet Take 25 mg by mouth 3 (three) times daily as needed for dizziness.      . mirtazapine (REMERON) 30 MG tablet Take 30 mg by mouth at bedtime.      . sertraline (ZOLOFT) 100 MG tablet Take 100 mg by mouth daily.       No current facility-administered medications for this visit.    History   Social History Narrative   Married 29 years . Has 3 children. Current smoker -1 pack a day-for 36 years .Alcohol :  1 beer on occasion.   She is soon to be a grandmother.  His before to going on a trip up to the [place] area to be closer to family when the baby is born.  She is extremely excited.   She very much would like to move back up to that area to be closer to family, but the whether is it just too cold in the winter.  ROS: A comprehensive Review of Systems - Essentially negative.  No recurrence of GI or GU symptoms that were associated with her near-syncope.she really hasn't even had any of her vertigo symptoms recently.    PHYSICAL EXAM BP 138/80  Pulse 64  Ht 5\' 7"  (1.702 m)  Wt 118 lb 6.4 oz (53.706 kg)  BMI 18.54 kg/m2 General appearance: alert, cooperative, no distress and Very thin but otherwise healthy appearing.  No acute distress. Neck: no adenopathy, no carotid bruit, no JVD, supple, symmetrical, trachea midline and thyroid not enlarged, symmetric, no tenderness/mass/nodules Lungs: Mostly clear to auscultation, but she does have some expiratory wheezes.  Nonlabored.   Otherwise good air movement. Heart: regular rate and rhythm, S1, S2 normal, no murmur, click, rub or gallop and normal apical impulse Abdomen: soft, non-tender; bowel sounds normal; no masses,  no organomegaly Extremities: extremities normal, atraumatic, no cyanosis or edema, no edema, redness or tenderness in the calves or thighs and no ulcers, gangrene or trophic changes Neurologic: Alert and oriented X 3, normal strength and tone. Normal symmetric reflexes. Normal coordination and gait  [id number] today: No  Recent Labs:  none  ASSESSMENT / PLAN: Hx of syncope- s/p Loop recorder 11/06/12 As I expected, her episodes are very few and far between.  Event monitor would not work.  The loop recorder is probably the best plan.  No further episodes she will just continue to monitor. She continues to take the meclizine for vertigo. She is on Zoloft which should potentially help of syncope, but is also potentially associated with syncope.  We will have to keep this in mind in the future evaluations if the loop recorder fails to reveal anything.  Vertigo This sounds like benign positional vertigo.  But there is also some component syndrome is not consistent with BPV. Continue meclizine.  Tobacco abuse 5-7 minutes of counseling given.  Recommended Quitline [name] for recommendations.  She has cut back some, and is hoping to continue to gradually cut back until she is not smoking at all.  She just worried about how this will affect her anxiety..     No orders of the defined types were placed in this encounter.   No orders of the defined types were placed in this encounter.    Followup:  6 months or sooner if symptoms occur.  [name], M.D., M.S. [facility] [address] [place], [place]  [address]  [phone] Pager # [phone]

## 2012-12-16 NOTE — Assessment & Plan Note (Signed)
This sounds like benign positional vertigo.  But there is also some component syndrome is not consistent with BPV. Continue meclizine.

## 2013-02-06 ENCOUNTER — Telehealth: Payer: Self-pay | Admitting: *Deleted

## 2013-02-06 NOTE — Telephone Encounter (Signed)
Spoke to patient earlier regarding the 3 second pause that was captured by her ilr. An appointment was made for next week with [facility] to discuss ppm implant. Patient voiced understanding.

## 2013-02-11 ENCOUNTER — Encounter: Payer: Self-pay | Admitting: Cardiovascular Disease

## 2013-02-11 ENCOUNTER — Ambulatory Visit (INDEPENDENT_AMBULATORY_CARE_PROVIDER_SITE_OTHER): Payer: Federal, State, Local not specified - PPO | Admitting: Cardiovascular Disease

## 2013-02-11 VITALS — BP 115/84 | HR 86 | Resp 16 | Ht 67.0 in | Wt 119.7 lb

## 2013-02-11 DIAGNOSIS — Z79899 Other long term (current) drug therapy: Secondary | ICD-10-CM

## 2013-02-11 DIAGNOSIS — Z9189 Other specified personal risk factors, not elsewhere classified: Secondary | ICD-10-CM

## 2013-02-11 DIAGNOSIS — D689 Coagulation defect, unspecified: Secondary | ICD-10-CM

## 2013-02-11 DIAGNOSIS — Z87898 Personal history of other specified conditions: Secondary | ICD-10-CM

## 2013-02-11 DIAGNOSIS — R5383 Other fatigue: Secondary | ICD-10-CM

## 2013-02-11 DIAGNOSIS — I455 Other specified heart block: Secondary | ICD-10-CM

## 2013-02-11 DIAGNOSIS — R0789 Other chest pain: Secondary | ICD-10-CM

## 2013-02-11 DIAGNOSIS — R5381 Other malaise: Secondary | ICD-10-CM

## 2013-02-11 NOTE — Patient Instructions (Addendum)
   Your physician has recommended that you have a pacemaker inserted next week Physiological scientist.   A pacemaker is a small device that is placed under the skin of your chest or abdomen to help control abnormal heart rhythms. This device uses electrical pulses to prompt the heart to beat at a normal rate. Pacemakers are used to treat heart rhythms that are too slow. Wire (leads) are attached to the pacemaker that goes into the chambers of you heart. This is done in the hospital and usually requires and overnight stay. Please see the instruction sheet given to you today for more information.    You will need an appointment to see Dr. Royann Shivers mid January 2015 with a pacemaker interrogation.

## 2013-02-14 ENCOUNTER — Encounter: Payer: Self-pay | Admitting: Cardiovascular Disease

## 2013-02-14 DIAGNOSIS — I455 Other specified heart block: Secondary | ICD-10-CM

## 2013-02-14 HISTORY — DX: Other specified heart block: I45.5

## 2013-02-14 NOTE — Assessment & Plan Note (Signed)
We discussed the fact that it was not simultaneous occurrence of symptoms and sinus pause, but that there is likely to be a direct relationship between sinus node dysfunction and her episodes of presyncope. Personally, I would like to monitor for a while longer before making a decision regarding pacemaker implantation, but she is very anxious that she will pass out, especially since she is going to help with her grandchild. We will therefore go ahead with dual chamber pacemaker implantation. The risks and benefits of this device, technical details and potential complications of implantation procedure were discussed with the patient. She provided informed consent.

## 2013-02-14 NOTE — Progress Notes (Signed)
Patient ID: [name], female   DOB: 03/07/1956, 57 y.o.   MRN: [id number]     Reason for office visit Followup of presyncope  Mrs. Ernster has had several episodes of near syncope occurring at rest while sitting down. Etiology was uncertain. A tilt table test was recommended at one point, but never performed. Implanted loop recorder in August. Since that time she has really not had any clinical presyncope. The loop recorder has recorded a 3.5 second sinus arrest, that occurred while she was lying down and was not symptomatic. She is troubled by anxiety and continues to struggle with smoking cessation. She is expecting a new grandchild in a few weeks . She plans to travel to visit her daughter for Christmas and then to spend a longer period of time helping with the newborn in February.   Allergies  Allergen Reactions  . Penicillins Other (See Comments)    Yeast infection    Current Outpatient Prescriptions  Medication Sig Dispense Refill  . ALPRAZolam (XANAX) 0.5 MG tablet Take 0.5 mg by mouth as needed for anxiety.       . divalproex (DEPAKOTE ER) 500 MG 24 hr tablet Take 500 mg by mouth 2 (two) times daily.      . meclizine (ANTIVERT) 25 MG tablet Take 25 mg by mouth 3 (three) times daily as needed for dizziness.      . mirtazapine (REMERON) 30 MG tablet Take 30 mg by mouth at bedtime.      . sertraline (ZOLOFT) 100 MG tablet Take 100 mg by mouth daily.       No current facility-administered medications for this visit.    Past Medical History  Diagnosis Date  . Syncope and collapse     echo-April 12,2012-EF 55% Echo normal  . Anxiety     anxiety and mild depressive order systoms  . Vertigo   . Tobacco abuse   . Sinus pause 02/14/2013    Past Surgical History  Procedure Laterality Date  . Transthoracic echocardiogram  07/11/2010    The left atrial size is normal.There is no evidence of mitral vavle prolaspe.Right ventriicular systolic pressure is normal . Injection of  contrast documented no interatrial shunt. Essentially normal 2D echo -doppler study   . Partial hysterectomy  01/01/ 1982  . Back surgery  10/ 01/ 2009    Family History  Problem Relation Age of Onset  . Adopted: Yes  . Heart attack Brother     History   Social History  . Marital Status: Married    Spouse Name: N/A    Number of Children: N/A  . Years of Education: N/A   Occupational History  . Not on file.   Social History Main Topics  . Smoking status: Current Every Day Smoker -- 0.50 packs/day for 36 years    Types: Cigarettes  . Smokeless tobacco: Never Used  . Alcohol Use: No  . Drug Use: No  . Sexual Activity: Not on file   Other Topics Concern  . Not on file   Social History Narrative   Married 29 years . Has 3 children. Current smoker -1 pack a day-for 36 years .Alcohol :  1 beer on occasion.   She is soon to be a grandmother.  His before to going on a trip up to the [place] area to be closer to family when the baby is born.  She is extremely excited.   She very much would like to move back up to   that area to be closer to family, but the whether is it just too cold in the winter.    Review of systems: The patient specifically denies any chest pain at rest or with exertion, dyspnea at rest or with exertion, orthopnea, paroxysmal nocturnal dyspnea, syncope, palpitations, focal neurological deficits, intermittent claudication, lower extremity edema, unexplained weight gain, cough, hemoptysis or wheezing.  The patient also denies abdominal pain, nausea, vomiting, dysphagia, diarrhea, constipation, polyuria, polydipsia, dysuria, hematuria, frequency, urgency, abnormal bleeding or bruising, fever, chills, unexpected weight changes, mood swings, change in skin or hair texture, change in voice quality, auditory or visual problems, allergic reactions or rashes, new musculoskeletal complaints other than usual "aches and pains".   PHYSICAL EXAM BP 115/84  Pulse  86  Resp 16  Ht 5' 7" (1.702 m)  Wt 119 lb 11.2 oz (54.296 kg)  BMI 18.74 kg/m2  General: Alert, oriented x3, no distress very slender Head: no evidence of trauma, PERRL, EOMI, no exophtalmos or lid lag, no myxedema, no xanthelasma; normal ears, nose and oropharynx Neck: normal jugular venous pulsations and no hepatojugular reflux; brisk carotid pulses without delay and no carotid bruits Chest: clear to auscultation, no signs of consolidation by percussion or palpation, normal fremitus, symmetrical and full respiratory excursions; loop recorder site well healed Cardiovascular: normal position and quality of the apical impulse, regular rhythm, normal first and second heart sounds, no murmurs, rubs or gallops Abdomen: no tenderness or distention, no masses by palpation, no abnormal pulsatility or arterial bruits, normal bowel sounds, no hepatosplenomegaly Extremities: no clubbing, cyanosis or edema; 2+ radial, ulnar and brachial pulses bilaterally; 2+ right femoral, posterior tibial and dorsalis pedis pulses; 2+ left femoral, posterior tibial and dorsalis pedis pulses; no subclavian or femoral bruits Neurological: grossly nonfocal   EKG: Normal sinus rhythm, possible left atrial enlargement  Lipid Panel  No results found for this basename: chol, trig, hdl, cholhdl, vldl, ldlcalc    BMET    Component Value Date/Time   NA 138 01/08/2008 1044   K 5.2* 01/08/2008 1044   CL 106 01/08/2008 1044   CO2 25 01/08/2008 1044   GLUCOSE 107* 01/08/2008 1044   BUN 4* 01/08/2008 1044   CREATININE 0.80 01/08/2008 1044   CALCIUM 9.8 01/08/2008 1044   GFRNONAA >60 01/08/2008 1044   GFRAA  Value: >60        The eGFR has been calculated using the MDRD equation. This calculation has not been validated in all clinical 01/08/2008 1044     ASSESSMENT AND PLAN Sinus pause We discussed the fact that it was not simultaneous occurrence of symptoms and sinus pause, but that there is likely to be a direct  relationship between sinus node dysfunction and her episodes of presyncope. Personally, I would like to monitor for a while longer before making a decision regarding pacemaker implantation, but she is very anxious that she will pass out, especially since she is going to help with her grandchild. We will therefore go ahead with dual chamber pacemaker implantation. The risks and benefits of this device, technical details and potential complications of implantation procedure were discussed with the patient. She provided informed consent.   Orders Placed This Encounter  Procedures  . CBC  . Comprehensive metabolic panel  . INR/PT  . PTT  . EKG 12-Lead  . PERMANENT PACEMAKER INSERTION   Meds ordered this encounter  Medications  . divalproex (DEPAKOTE ER) 500 MG 24 hr tablet    Sig: Take 500 mg by mouth 2 (  two) times daily.    Brentin Shin  Andi Layfield, MD, FACC [facility] [phone] office [phone] pager   

## 2013-02-16 ENCOUNTER — Encounter: Payer: Self-pay | Admitting: Cardiovascular Disease

## 2013-02-16 ENCOUNTER — Other Ambulatory Visit: Payer: Self-pay | Admitting: *Deleted

## 2013-02-16 ENCOUNTER — Telehealth: Payer: Self-pay | Admitting: *Deleted

## 2013-02-16 ENCOUNTER — Encounter (HOSPITAL_COMMUNITY): Payer: Self-pay | Admitting: Pharmacist

## 2013-02-16 DIAGNOSIS — I455 Other specified heart block: Secondary | ICD-10-CM

## 2013-02-16 HISTORY — PX: PACEMAKER PLACEMENT: SHX43

## 2013-02-16 LAB — MDC_IDC_ENUM_SESS_TYPE_INCLINIC

## 2013-02-16 NOTE — Telephone Encounter (Signed)
Orders to be placed for permanent pacemaker  Dual chamber Medtronic

## 2013-02-17 LAB — COMPREHENSIVE METABOLIC PANEL
ALT: <8 U/L (ref 0–35)
AST: 12 U/L (ref 0–37)
Albumin: 4.6 g/dL (ref 3.5–5.2)
Alkaline Phosphatase: 54 U/L (ref 39–117)
BUN: 10 mg/dL (ref 6–23)
CO2: 27 mEq/L (ref 19–32)
Calcium: 9.6 mg/dL (ref 8.4–10.5)
Chloride: 107 mEq/L (ref 96–112)
Creat: 0.84 mg/dL (ref 0.50–1.10)
Glucose, Bld: 99 mg/dL (ref 70–99)
Potassium: 4.6 mEq/L (ref 3.5–5.3)
Sodium: 141 mEq/L (ref 135–145)
Total Bilirubin: 0.7 mg/dL (ref 0.3–1.2)
Total Protein: 6.8 g/dL (ref 6.0–8.3)

## 2013-02-17 LAB — CBC
HCT: 41.3 % (ref 36.0–46.0)
Hemoglobin: 14 g/dL (ref 12.0–15.0)
MCH: 29 pg (ref 26.0–34.0)
MCHC: 33.9 g/dL (ref 30.0–36.0)
MCV: 85.5 fL (ref 78.0–100.0)
Platelets: 246 10*3/uL (ref 150–400)
RBC: 4.83 MIL/uL (ref 3.87–5.11)
RDW: 14.5 % (ref 11.5–15.5)
WBC: 8.2 10*3/uL (ref 4.0–10.5)

## 2013-02-17 LAB — APTT: aPTT: 34 seconds (ref 24–37)

## 2013-02-17 LAB — PROTIME-INR
INR: 0.95 (ref <1.50)
Prothrombin Time: 12.7 seconds (ref 11.6–15.2)

## 2013-02-19 MED ORDER — SODIUM CHLORIDE 0.9 % IR SOLN
80.0000 mg | Status: DC
  Filled 2013-02-19: qty 2

## 2013-02-19 MED ORDER — VANCOMYCIN HCL IN DEXTROSE 1-5 GM/200ML-% IV SOLN
1000.0000 mg | INTRAVENOUS | Status: DC
  Filled 2013-02-19: qty 200

## 2013-02-20 ENCOUNTER — Encounter (HOSPITAL_COMMUNITY): Payer: Self-pay | Admitting: General Practice

## 2013-02-20 ENCOUNTER — Encounter (HOSPITAL_COMMUNITY)
Admission: RE | Disposition: A | Discharge status: Home / Self Care | Payer: Self-pay | Source: Ambulatory Visit | Attending: Cardiovascular Disease

## 2013-02-20 ENCOUNTER — Ambulatory Visit (HOSPITAL_COMMUNITY)
Admission: RE | Admit: 2013-02-20 | Discharge: 2013-02-21 | Disposition: A | Discharge status: Home / Self Care | Payer: Federal, State, Local not specified - PPO | Source: Ambulatory Visit | Attending: Cardiovascular Disease | Admitting: Cardiovascular Disease

## 2013-02-20 DIAGNOSIS — I495 Sick sinus syndrome: Secondary | ICD-10-CM

## 2013-02-20 DIAGNOSIS — I455 Other specified heart block: Secondary | ICD-10-CM | POA: Diagnosis present

## 2013-02-20 DIAGNOSIS — Z880 Allergy status to penicillin: Secondary | ICD-10-CM | POA: Insufficient documentation

## 2013-02-20 DIAGNOSIS — Z87898 Personal history of other specified conditions: Secondary | ICD-10-CM

## 2013-02-20 DIAGNOSIS — F411 Generalized anxiety disorder: Secondary | ICD-10-CM | POA: Insufficient documentation

## 2013-02-20 DIAGNOSIS — Z23 Encounter for immunization: Secondary | ICD-10-CM | POA: Insufficient documentation

## 2013-02-20 DIAGNOSIS — F17200 Nicotine dependence, unspecified, uncomplicated: Secondary | ICD-10-CM | POA: Insufficient documentation

## 2013-02-20 DIAGNOSIS — Z4509 Encounter for adjustment and management of other cardiac device: Secondary | ICD-10-CM | POA: Insufficient documentation

## 2013-02-20 DIAGNOSIS — Z950 Presence of cardiac pacemaker: Secondary | ICD-10-CM | POA: Diagnosis present

## 2013-02-20 HISTORY — DX: Presence of cardiac pacemaker: Z95.0

## 2013-02-20 HISTORY — PX: PERMANENT PACEMAKER INSERTION: SHX5480

## 2013-02-20 HISTORY — DX: Bipolar disorder, unspecified: F31.9

## 2013-02-20 HISTORY — PX: INSERT / REPLACE / REMOVE PACEMAKER: SUR710

## 2013-02-20 HISTORY — DX: Headache: R51

## 2013-02-20 LAB — SURGICAL PCR SCREEN
MRSA, PCR: NEGATIVE
Staphylococcus aureus: NEGATIVE

## 2013-02-20 SURGERY — PERMANENT PACEMAKER INSERTION
Anesthesia: LOCAL

## 2013-02-20 MED ORDER — SODIUM CHLORIDE 0.9 % IV SOLN
INTRAVENOUS | Status: DC
  Administered 2013-02-20: 08:00:00 via INTRAVENOUS

## 2013-02-20 MED ORDER — MIDAZOLAM HCL 5 MG/5ML IJ SOLN
INTRAMUSCULAR | Status: AC
  Filled 2013-02-20: qty 5

## 2013-02-20 MED ORDER — MUPIROCIN 2 % EX OINT
TOPICAL_OINTMENT | Freq: Once | CUTANEOUS | Status: AC
  Administered 2013-02-20: 1 via NASAL
  Filled 2013-02-20 (×2): qty 22

## 2013-02-20 MED ORDER — NITROGLYCERIN 0.2 MG/ML ON CALL CATH LAB
INTRAVENOUS | Status: AC
  Filled 2013-02-20: qty 1

## 2013-02-20 MED ORDER — SODIUM CHLORIDE 0.9 % IJ SOLN: 3.0000 mL | INTRAMUSCULAR | Status: DC | PRN

## 2013-02-20 MED ORDER — SERTRALINE HCL 100 MG PO TABS
100.0000 mg | ORAL_TABLET | Freq: Every day | ORAL | Status: DC
  Administered 2013-02-20 – 2013-02-21 (×2): 100 mg via ORAL
  Filled 2013-02-20 (×2): qty 1

## 2013-02-20 MED ORDER — ONDANSETRON HCL 4 MG/2ML IJ SOLN: 4.0000 mg | Freq: Four times a day (QID) | INTRAMUSCULAR | Status: DC | PRN

## 2013-02-20 MED ORDER — ACETAMINOPHEN 325 MG PO TABS: 325.0000 mg | ORAL_TABLET | ORAL | Status: DC | PRN

## 2013-02-20 MED ORDER — SODIUM CHLORIDE 0.9 % IV SOLN
INTRAVENOUS | Status: AC
  Administered 2013-02-20: 11:00:00 via INTRAVENOUS

## 2013-02-20 MED ORDER — MIRTAZAPINE 30 MG PO TABS
30.0000 mg | ORAL_TABLET | Freq: Every day | ORAL | Status: DC
  Administered 2013-02-20: 30 mg via ORAL
  Filled 2013-02-20 (×2): qty 1

## 2013-02-20 MED ORDER — VANCOMYCIN HCL IN DEXTROSE 1-5 GM/200ML-% IV SOLN
1000.0000 mg | Freq: Two times a day (BID) | INTRAVENOUS | Status: AC
  Administered 2013-02-20: 1000 mg via INTRAVENOUS
  Filled 2013-02-20: qty 200

## 2013-02-20 MED ORDER — ALPRAZOLAM 0.25 MG PO TABS: 0.2500 mg | ORAL_TABLET | ORAL | Status: DC | PRN

## 2013-02-20 MED ORDER — DIVALPROEX SODIUM ER 500 MG PO TB24
500.0000 mg | ORAL_TABLET | Freq: Every day | ORAL | Status: DC
  Administered 2013-02-20 – 2013-02-21 (×2): 500 mg via ORAL
  Filled 2013-02-20 (×2): qty 1

## 2013-02-20 MED ORDER — HYDROCODONE-ACETAMINOPHEN 5-325 MG PO TABS
1.0000 | ORAL_TABLET | ORAL | Status: DC | PRN
  Administered 2013-02-20: 2 via ORAL
  Filled 2013-02-20: qty 2

## 2013-02-20 MED ORDER — LIDOCAINE HCL (PF) 1 % IJ SOLN
INTRAMUSCULAR | Status: AC
  Filled 2013-02-20: qty 30

## 2013-02-20 MED ORDER — PNEUMOCOCCAL VAC POLYVALENT 25 MCG/0.5ML IJ INJ
0.5000 mL | INJECTION | INTRAMUSCULAR | Status: AC
  Administered 2013-02-21: 0.5 mL via INTRAMUSCULAR
  Filled 2013-02-20: qty 0.5

## 2013-02-20 MED ORDER — HEPARIN (PORCINE) IN NACL 2-0.9 UNIT/ML-% IJ SOLN
INTRAMUSCULAR | Status: AC
  Filled 2013-02-20: qty 1000

## 2013-02-20 MED ORDER — FENTANYL CITRATE 0.05 MG/ML IJ SOLN
INTRAMUSCULAR | Status: AC
  Filled 2013-02-20: qty 2

## 2013-02-20 NOTE — H&P (View-Only) (Signed)
Patient ID: [name], female   DOB: [birth year], 57 y.o.   MRN: [id number]     Reason for office visit Followup of presyncope  [name] has had several episodes of near syncope occurring at rest while sitting down. Etiology was uncertain. A tilt table test was recommended at one point, but never performed. Implanted loop recorder in August. Since that time she has really not had any clinical presyncope. The loop recorder has recorded a 3.5 second sinus arrest, that occurred while she was lying down and was not symptomatic. She is troubled by anxiety and continues to struggle with smoking cessation. She is expecting a new grandchild in a few weeks . She plans to travel to visit her daughter for Christmas and then to spend a longer period of time helping with the newborn in February.   Allergies  Allergen Reactions  . Penicillins Other (See Comments)    Yeast infection    Current Outpatient Prescriptions  Medication Sig Dispense Refill  . ALPRAZolam (XANAX) 0.5 MG tablet Take 0.5 mg by mouth as needed for anxiety.       . divalproex (DEPAKOTE ER) 500 MG 24 hr tablet Take 500 mg by mouth 2 (two) times daily.      . meclizine (ANTIVERT) 25 MG tablet Take 25 mg by mouth 3 (three) times daily as needed for dizziness.      . mirtazapine (REMERON) 30 MG tablet Take 30 mg by mouth at bedtime.      . sertraline (ZOLOFT) 100 MG tablet Take 100 mg by mouth daily.       No current facility-administered medications for this visit.    Past Medical History  Diagnosis Date  . Syncope and collapse     echo-April 12,2012-EF 55% Echo normal  . Anxiety     anxiety and mild depressive order systoms  . Vertigo   . Tobacco abuse   . Sinus pause 02/14/2013    Past Surgical History  Procedure Laterality Date  . Transthoracic echocardiogram  07/11/2010    The left atrial size is normal.There is no evidence of mitral vavle prolaspe.Right ventriicular systolic pressure is normal . Injection of  contrast documented no interatrial shunt. Essentially normal 2D echo -doppler study   . Partial hysterectomy  01/01/ 1982  . Back surgery  10/ 01/ 2009    Family History  Problem Relation Age of Onset  . Adopted: Yes  . Heart attack Brother     History   Social History  . Marital Status: Married    Spouse Name: N/A    Number of Children: N/A  . Years of Education: N/A   Occupational History  . Not on file.   Social History Main Topics  . Smoking status: Current Every Day Smoker -- 0.50 packs/day for 36 years    Types: Cigarettes  . Smokeless tobacco: Never Used  . Alcohol Use: No  . Drug Use: No  . Sexual Activity: Not on file   Other Topics Concern  . Not on file   Social History Narrative   Married 29 years . Has 3 children. Current smoker -1 pack a day-for 36 years .Alcohol :  1 beer on occasion.   She is soon to be a grandmother.  His before to going on a trip up to the [place] area to be closer to family when the baby is born.  She is extremely excited.   She very much would like to move back up to  that area to be closer to family, but the whether is it just too cold in the winter.    Review of systems: The patient specifically denies any chest pain at rest or with exertion, dyspnea at rest or with exertion, orthopnea, paroxysmal nocturnal dyspnea, syncope, palpitations, focal neurological deficits, intermittent claudication, lower extremity edema, unexplained weight gain, cough, hemoptysis or wheezing.  The patient also denies abdominal pain, nausea, vomiting, dysphagia, diarrhea, constipation, polyuria, polydipsia, dysuria, hematuria, frequency, urgency, abnormal bleeding or bruising, fever, chills, unexpected weight changes, mood swings, change in skin or hair texture, change in voice quality, auditory or visual problems, allergic reactions or rashes, new musculoskeletal complaints other than usual "aches and pains".   PHYSICAL EXAM BP 115/84  Pulse  86  Resp 16  Ht 5\' 7"  (1.702 m)  Wt 119 lb 11.2 oz (54.296 kg)  BMI 18.74 kg/m2  General: Alert, oriented x3, no distress very slender Head: no evidence of trauma, PERRL, EOMI, no exophtalmos or lid lag, no myxedema, no xanthelasma; normal ears, nose and oropharynx Neck: normal jugular venous pulsations and no hepatojugular reflux; brisk carotid pulses without delay and no carotid bruits Chest: clear to auscultation, no signs of consolidation by percussion or palpation, normal fremitus, symmetrical and full respiratory excursions; loop recorder site well healed Cardiovascular: normal position and quality of the apical impulse, regular rhythm, normal first and second heart sounds, no murmurs, rubs or gallops Abdomen: no tenderness or distention, no masses by palpation, no abnormal pulsatility or arterial bruits, normal bowel sounds, no hepatosplenomegaly Extremities: no clubbing, cyanosis or edema; 2+ radial, ulnar and brachial pulses bilaterally; 2+ right femoral, posterior tibial and dorsalis pedis pulses; 2+ left femoral, posterior tibial and dorsalis pedis pulses; no subclavian or femoral bruits Neurological: grossly nonfocal   EKG: Normal sinus rhythm, possible left atrial enlargement  Lipid Panel  No results found for this basename: chol, trig, hdl, cholhdl, vldl, ldlcalc    BMET    Component Value Date/Time   NA 138 01/08/2008 1044   K 5.2* 01/08/2008 1044   CL 106 01/08/2008 1044   CO2 25 01/08/2008 1044   GLUCOSE 107* 01/08/2008 1044   BUN 4* 01/08/2008 1044   CREATININE 0.80 01/08/2008 1044   CALCIUM 9.8 01/08/2008 1044   GFRNONAA >60 01/08/2008 1044   GFRAA  Value: >60        The eGFR has been calculated using the MDRD equation. This calculation has not been validated in all clinical 01/08/2008 1044     ASSESSMENT AND PLAN Sinus pause We discussed the fact that it was not simultaneous occurrence of symptoms and sinus pause, but that there is likely to be a direct  relationship between sinus node dysfunction and her episodes of presyncope. Personally, I would like to monitor for a while longer before making a decision regarding pacemaker implantation, but she is very anxious that she will pass out, especially since she is going to help with her grandchild. We will therefore go ahead with dual chamber pacemaker implantation. The risks and benefits of this device, technical details and potential complications of implantation procedure were discussed with the patient. She provided informed consent.   Orders Placed This Encounter  Procedures  . CBC  . Comprehensive metabolic panel  . INR/PT  . PTT  . EKG 12-Lead  . PERMANENT PACEMAKER INSERTION   Meds ordered this encounter  Medications  . divalproex (DEPAKOTE ER) 500 MG 24 hr tablet    Sig: Take 500 mg by mouth 2 (  two) times daily.    Junious Silk, MD, [facility] [phone] office [phone] pager

## 2013-02-20 NOTE — CV Procedure (Signed)
Procedure report  Procedure performed:  1. Implantation of new dual chamber permanent pacemaker 2. Fluoroscopy 3. Light sedation 4. Loop recorder explantation Reason for procedure: Syncope due to: Sinus arrest  Procedure performed by: Thurmon Fair, MD  Complications: None  Estimated blood loss: <10 mL  Medications administered during procedure: Vancomycin 1 g intravenously Lidocaine 1% 30 mL locally,  Fentanyl 100 mcg intravenously Versed 4 mg intravenously  Device details: Generator Medtronic Adapta L model ADDRL1 serial number [id number] Right atrial lead Medtronic [id number], serial number [id number] Right ventricular lead Medtronic [id number],  serial number [id number]   Procedure details:  After the risks and benefits of the procedure were discussed the patient provided informed consent and was brought to the cardiac cath lab in the fasting state. The patient was prepped and draped in usual sterile fashion. Local anesthesia with 1% lidocaine was administered to to the left infraclavicular area. A 5-6 cm horizontal incision was made parallel with and 2-3 cm caudal to the left clavicle. Using electrocautery and blunt dissection a prepectoral pocket was created down to the level of the pectoralis major muscle fascia. The pocket was carefully inspected for hemostasis. An antibiotic-soaked sponge was placed in the pocket.  Under fluoroscopic guidance and using the modified Seldinger technique 2 separate venipunctures were performed to access the left subclavian vein. No difficulty was encountered accessing the vein.  Two J-tip guidewires were subsequently exchanged for two 7 French safe sheaths.  Under fluoroscopic guidance the ventricular lead was advanced to level of the mid to apical right ventricular septum and thet active-fixation helix was deployed. Prominent current of injury was seen. Satisfactory pacing and sensing parameters were recorded. There was no evidence of  diaphragmatic stimulation at maximum device output. The safe sheath was peeled away and the lead was secured in place with 2-0 silk.  In similar fashion the right atrial lead was advanced to the level of the atrial appendage. The active-fixation helix was deployed. There was prominent current of injury. Satisfactory  pacing and sensing parameters were recorded. There was no evidence of diaphragmatic stimulation with pacing at maximum device output. The safe sheath was peeled away and the lead was secured in place with 2-0 silk.  The antibiotic-soaked sponge was removed from the pocket. The pocket was flushed with copious amounts of antibiotic solution. Reinspection showed excellent hemostasis..  The ventricular lead was connected to the generator and appropriate ventricular pacing was seen. Subsequently the atrial lead was also connected. Repeat testing of the lead parameters later showed excellent values.  The entire system was then carefully inserted in the pocket with care been taking that the leads and device assumed a comfortable position without pressure on the incision. Great care was taken that the leads be located deep to the generator. The pocket was then closed in layers using 2 layers of 2-0 Vicryl and cutaneous staples, after which a sterile dressing was applied.  1% Lidocaine was then administered to the loop recorder site. A 2 cm incision was made and the loop was easily located and removed. The pocket was flushed with copious amounts of antibiotic solution. Reinspection showed excellent hemostasis. Two cutaneous staples were used to close the site.   At the end of the procedure the following lead parameters were encountered:  Right atrial lead  sensed P waves 1.9 mV, impedance 903 ohms, threshold 0.9 V at 0.5 ms pulse width.  Right ventricular lead sensed R waves 12.2 mV, impedance 1104 ohms, threshold 1.2 V at 0.5 ms pulse  widthThurmon Fair, MD, [facility] [phone] office [phone] pager

## 2013-02-20 NOTE — Interval H&P Note (Signed)
History and Physical Interval Note:  02/20/2013 10:08 AM  [name]  has presented today for surgery, with the diagnosis of hb  The various methods of treatment have been discussed with the patient and family. After consideration of risks, benefits and other options for treatment, the patient has consented to  Procedure(s): PERMANENT PACEMAKER INSERTION (N/A) as a surgical intervention .  The patient's history has been reviewed, patient examined, no change in status, stable for surgery.  I have reviewed the patient's chart and labs.  Questions were answered to the patient's satisfaction.     Candelario Steppe

## 2013-02-21 ENCOUNTER — Ambulatory Visit (HOSPITAL_COMMUNITY): Payer: Federal, State, Local not specified - PPO

## 2013-02-21 DIAGNOSIS — I455 Other specified heart block: Secondary | ICD-10-CM

## 2013-02-21 MED ORDER — YOU HAVE A PACEMAKER BOOK
Freq: Once | Status: AC
  Administered 2013-02-21: 10:00:00
  Filled 2013-02-21: qty 1

## 2013-02-21 NOTE — Discharge Summary (Signed)
Physician Discharge Summary  Patient ID: [name] MRN: [id number] DOB/AGE: 12-07-1955 57 y.o.  Admit date: 02/20/2013 Discharge date: 02/21/2013  Admission Diagnoses: Sinus Pause  Discharge Diagnoses:  Active Problems:   Sinus pause   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   Discharged Condition: stable  Hospital Course: The patient is a 57 y/o female, followed by Dr. Royann Shivers, who presented to [facility] on [facility]/20/13 to undergo planned insertion of a PPM. The indications for insertion were for frequent near syncopal episodes with a captured 3.5 second sinus arrest on a loop recorder. The procedure was performed by Dr. Royann Shivers. She underwent successful implantation of a Medtronic dual chamber PPM. She tolerated the procedure well. Post-operative CXR demonstrated proper lead placement and no evidence of pneumothorax. She had no chest pain and no SOB. The incision site had minimal drainage. She had no significant pain. Device interrogation revealed normal device functioning. She was last seen and examined by Dr. Tresa Endo, who determined she was stable for discharge home. She was instructed to continue wearing the arm sling for 2 weeks to prevent lead dislodgement. She was provided detailed instructions on proper wound care and activity restrictions. She will follow-up in clinic in 7-10 days for wound check and staple removal. She will follow-up with Dr. Royann Shivers in 1 month for a pacer check.   Consults: None  Significant Diagnostic Studies:   Post-op CXR 02/21/13 CHEST 2 VIEW  COMPARISON: [facility] images for chest CT dated 07/07/2012  FINDINGS: Dual lead pacemaker has been inserted. Heart size and vascularity are normal. Lungs are clear. No pneumothorax. No significant osseous abnormality.  IMPRESSION: No active cardiopulmonary disease. New pacemaker.    Treatments:  Device details:  Generator Medtronic Adapta L model ADDRL1 serial number [id number]  Right atrial lead  Medtronic [id number], serial number [id number]  Right ventricular lead Medtronic [id number], serial number [id number]   Discharge Exam: Blood pressure 96/63, pulse 72, temperature 98.1 F (36.7 C), temperature source Oral, resp. rate 18, height 5\' 7"  (1.702 m), weight 119 lb 8 oz (54.205 kg), SpO2 96.00%.   Disposition: 01-Home or Self Care      Discharge Orders   Future Orders Complete By Expires   Diet - low sodium heart healthy  As directed    Discharge instructions  As directed    Comments:     Supplemental Discharge Instructions for  Pacemaker/Defibrillator Patients  Activity Do not raise your left/right arm above shoulder level or extend it backward beyond shoulder level for 2 weeks. Wear the arm sling as a reminder or as needed for comfort for 2 weeks. No heavy lifting or vigorous activity with your left/right arm for 6-8 weeks.    NO DRIVING is preferable for 2 weeks; If absolutely necessary, drive only short, familiar routes. DO wear your seatbelt, even if it crosses over the pacemaker site.  WOUND CARE Keep the wound area clean and dry.  Remove the dressing the day after you return home (usually 48 hours after the procedure). DO NOT SUBMERGE UNDER WATER UNTIL FULLY HEALED (no tub baths, hot tubs, swimming pools, etc.).  You  may shower or take a sponge bath after the dressing is removed. DO NOT SOAK the area and do not allow the shower to directly spray on the site. If you have staples, these will be removed in the office in 7-14 days. If you have tape/steri-strips on your wound, these will fall off; do not pull them off prematurely.   No bandage is needed on  the site.  DO  NOT apply any creams, oils, or ointments to the wound area. If you notice any drainage or discharge from the wound, any swelling, excessive redness or bruising at the site, or if you develop a fever > 101? F after you are discharged home, call the office at once.  Special Instructions You are still able to  use cellular telephones.  Avoid carrying your cellular phone near your device. When traveling through airports, show security personnel your identification card to avoid being screened in the metal detectors.  Avoid arc welding equipment, MRI testing (magnetic resonance imaging), TENS units (transcutaneous nerve stimulators).  Call the office for questions about other devices. Avoid electrical appliances that are in poor condition or are not properly grounded. Microwave ovens are safe to be near or to operate.  Additional information for defibrillator patients should your device go off: If your device goes off ONCE and you feel fine afterward, notify the clinic at [phone]. If your device goes off ONCE and you do not feel well afterward, call 911. If your device goes off TWICE or more in one day, call 911.  DO NOT DRIVE YOURSELF OR A FAMILY MEMBER WITH A DEFIBRILLATOR TO THE HOSPITAL-CALL 911.   Increase activity slowly  As directed        Medication List         ALPRAZolam 0.5 MG tablet  Commonly known as:  XANAX  Take 0.25 mg by mouth as needed for anxiety.     divalproex 500 MG 24 hr tablet  Commonly known as:  DEPAKOTE ER  Take 500 mg by mouth daily.     mirtazapine 30 MG tablet  Commonly known as:  REMERON  Take 30 mg by mouth at bedtime.     sertraline 100 MG tablet  Commonly known as:  ZOLOFT  Take 100 mg by mouth daily.       Follow-up Information   Follow up with HAGER, BRYAN, PA-C. (Our office will call you withthe appt date and time.)    Specialty:  Physician Assistant   Contact information:   [address] [place] [address] [phone]      TIME SPENT ON DISCHARGE, INCLUDING PHYSICIAN TIME: >30 MINUTES  Signed: Robbie Lis, PA-C 02/21/2013, 12:21 PM

## 2013-02-21 NOTE — Progress Notes (Signed)
    Subjective: No complaints. Denies CP/SOB. Minimal incisional discomfort.   Objective: Vital signs in last 24 hours: Temp:  [97.5 F (36.4 C)-98.3 F (36.8 C)] 98.1 F (36.7 C) (12/06 0412) Pulse Rate:  [65-78] 72 (12/06 0412) Resp:  [16-20] 18 (12/06 0412) BP: (91-140)/(61-92) 96/63 mmHg (12/06 0412) SpO2:  [96 %-100 %] 96 % (12/06 0412) Weight:  [118 lb (53.524 kg)-119 lb 8 oz (54.205 kg)] 119 lb 8 oz (54.205 kg) (12/06 0412) Last BM Date: 02/20/13  Intake/Output from previous day: 12/05 0701 - 12/06 0700 In: 480 [P.O.:480] Out: -  Intake/Output this shift:    Medications Current Facility-Administered Medications  Medication Dose Route Frequency Provider Last Rate Last Dose  . acetaminophen (TYLENOL) tablet 325-650 mg  325-650 mg Oral Q4H PRN Mihai Croitoru, MD      . ALPRAZolam Prudy Feeler) tablet 0.25 mg  0.25 mg Oral PRN Mihai Croitoru, MD      . divalproex (DEPAKOTE ER) 24 hr tablet 500 mg  500 mg Oral Daily Mihai Croitoru, MD   500 mg at 02/20/13 1215  . HYDROcodone-acetaminophen (NORCO/VICODIN) 5-325 MG per tablet 1-2 tablet  1-2 tablet Oral Q4H PRN Thurmon Fair, MD   2 tablet at 02/20/13 1221  . mirtazapine (REMERON) tablet 30 mg  30 mg Oral QHS Mihai Croitoru, MD   30 mg at 02/20/13 2202  . ondansetron (ZOFRAN) injection 4 mg  4 mg Intravenous Q6H PRN Mihai Croitoru, MD      . pneumococcal 23 valent vaccine (PNU-IMMUNE) injection 0.5 mL  0.5 mL Intramuscular Tomorrow-1000 Mihai Croitoru, MD      . sertraline (ZOLOFT) tablet 100 mg  100 mg Oral Daily Mihai Croitoru, MD   100 mg at 02/20/13 1215    PE: General appearance: alert, cooperative and no distress Lungs: clear to auscultation bilaterally Heart: regular rate and rhythm, S1, S2 normal, no murmur, click, rub or gallop Extremities: no LEE Pulses: 2+ and symmetric Skin: warm and dry Neurologic: Grossly normal  Lab Results:  No results found for this basename: WBC, HGB, HCT, PLT,  in the last 72  hours BMET No results found for this basename: NA, K, CL, CO2, GLUCOSE, BUN, CREATININE, CALCIUM,  in the last 72 hours  Studies/Results:  Post-op CXR 02/21/13  Radiologist report pending   Assessment/Plan  Active Problems:   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  Plan: Day 1 s/p Medtronic dual chamber PPM. No CP or SOB. Post-operative CXR shows no evidence of pneumothorax. Leads properly placed. Minimal drainage at incision site. In arm sling. Device interrogation demonstrates normal functioning. NSR on telemetry. HR in the mid 60s. Plan for discharge home today. F/u in 7-10 days for staple removal and again in 1 month for pacer check with Dr. Royann Shivers. MD to follow.     LOS: 1 day    Brittainy M. Delmer Islam 02/21/2013 7:44 AM   Patient seen and examined. Agree with assessment and plan. Feels great. Normal function on interrogation. CXR stable. For dc today.   Lennette Bihari, MD, [facility] 02/21/2013 9:09 AM

## 2013-02-26 ENCOUNTER — Telehealth: Payer: Self-pay | Admitting: *Deleted

## 2013-02-26 NOTE — Telephone Encounter (Signed)
[name],  Pt needs pacer site check/staple removal 7-14 days from 12.6.14 w/ Wilburt Finlay, PA-C.  Please contact pt to schedule.  Thanks.  Macgregor Aeschliman M. Lorin Picket, BSN, RN

## 2013-02-26 NOTE — Telephone Encounter (Signed)
Pt stated that someone would call her about staple removal and no one had called her. She needs to know what to do. She had a pacemaker put in on Friday

## 2013-02-27 ENCOUNTER — Ambulatory Visit (INDEPENDENT_AMBULATORY_CARE_PROVIDER_SITE_OTHER): Payer: Federal, State, Local not specified - PPO | Admitting: Cardiovascular Disease

## 2013-02-27 VITALS — BP 120/80 | HR 88 | Ht 67.0 in | Wt 119.0 lb

## 2013-02-27 DIAGNOSIS — Z950 Presence of cardiac pacemaker: Secondary | ICD-10-CM

## 2013-02-27 NOTE — Patient Instructions (Signed)
Your physician recommends that you schedule a follow-up appointment in: 1 month with pacemaker check

## 2013-03-02 ENCOUNTER — Ambulatory Visit: Payer: Federal, State, Local not specified - PPO | Admitting: Cardiology

## 2013-03-08 ENCOUNTER — Encounter: Payer: Self-pay | Admitting: Cardiovascular Disease

## 2013-03-08 NOTE — Progress Notes (Signed)
Patient ID: [name], female   DOB: [birth year], 57 y.o.   MRN: [id number]      Reason for office visit Wound check following pacemaker implantation  She has not had any further presyncopal or syncopal events. The surgical site is healing well. She has not had fever or chills or bleeding or swelling at the site.  Her daughter had a fetus with multiple malformations and genetic testing revealed that he has a desmoplakin mutation as seen with arrhythmogenic right ventricular cardiomyopathy. She asks whether this could be related to her arrhythmia problem and whether or not her other family members are at risk.  Allergies  Allergen Reactions  . Penicillins Other (See Comments)    Yeast infection    Current Outpatient Prescriptions  Medication Sig Dispense Refill  . ALPRAZolam (XANAX) 0.5 MG tablet Take 0.25 mg by mouth as needed for anxiety.       . divalproex (DEPAKOTE ER) 500 MG 24 hr tablet Take 500 mg by mouth daily.       . mirtazapine (REMERON) 30 MG tablet Take 30 mg by mouth at bedtime.      . sertraline (ZOLOFT) 100 MG tablet Take 100 mg by mouth daily.       No current facility-administered medications for this visit.    Past Medical History  Diagnosis Date  . Syncope and collapse     echo-April 12,2012-EF 55% Echo normal  . Anxiety     anxiety and mild depressive order systoms  . Vertigo   . Tobacco abuse   . Sinus pause 02/14/2013  . Pacemaker   . QMVHQION(629.5)     "monthly" (02/20/2013)  . Bipolar disorder     "dx'd ~ 1 wk ago" (02/20/2013)    Past Surgical History  Procedure Laterality Date  . Transthoracic echocardiogram  07/11/2010    The left atrial size is normal.There is no evidence of mitral vavle prolaspe.Right ventriicular systolic pressure is normal . Injection of contrast documented no interatrial shunt. Essentially normal 2D echo -doppler study   . Abdominal hysterectomy  03/19/1980    "partial"  . Insert / replace / remove pacemaker  02/20/2013    . Posterior lumbar fusion  ~ 2009  . Back surgery      Family History  Problem Relation Age of Onset  . Adopted: Yes  . Heart attack Brother     History   Social History  . Marital Status: Married    Spouse Name: N/A    Number of Children: N/A  . Years of Education: N/A   Occupational History  . Not on file.   Social History Main Topics  . Smoking status: Current Every Day Smoker -- 1.00 packs/day for 40 years    Types: Cigarettes  . Smokeless tobacco: Never Used  . Alcohol Use: No  . Drug Use: No  . Sexual Activity: Yes   Other Topics Concern  . Not on file   Social History Narrative   Married 29 years . Has 3 children. Current smoker -1 pack a day-for 36 years .Alcohol :  1 beer on occasion.   She is soon to be a grandmother.  His before to going on a trip up to the [place] area to be closer to family when the baby is born.  She is extremely excited.   She very much would like to move back up to that area to be closer to family, but the whether is it just too cold in  the winter.     PHYSICAL EXAM BP 120/80  Pulse 88  Ht 5\' 7"  (1.702 m)  Wt 119 lb (53.978 kg)  BMI 18.63 kg/m2 Surgical site is healing nicely. The staples were removed. There is no evidence of hematoma, drainage, warmth or tenderness or redness.  BMET    Component Value Date/Time   NA 141 02/16/2013 1345   K 4.6 02/16/2013 1345   CL 107 02/16/2013 1345   CO2 27 02/16/2013 1345   GLUCOSE 99 02/16/2013 1345   BUN 10 02/16/2013 1345   CREATININE 0.84 02/16/2013 1345   CREATININE 0.80 01/08/2008 1044   CALCIUM 9.6 02/16/2013 1345   GFRNONAA >60 01/08/2008 1044   GFRAA  Value: >60        The eGFR has been calculated using the MDRD equation. This calculation has not been validated in all clinical 01/08/2008 1044     ASSESSMENT AND PLAN Well healing pacemaker site.  She'll return in one month for comprehensive pacemaker interrogation.  She does not have any history or  echocardiographic findings or ECG abnormalities that would suggest that she might have ARVC. There is no family history of malignant ventricular arrhythmia or sudden death. It sounds like her daughters can have genetic testing herself. 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   Her pacemaker is a useful monitor for any ventricular arrhythmias. Junious Silk, MD, [facility] [phone] office [phone] pager

## 2013-03-30 ENCOUNTER — Encounter: Payer: Self-pay | Admitting: Cardiovascular Disease

## 2013-03-30 ENCOUNTER — Ambulatory Visit (INDEPENDENT_AMBULATORY_CARE_PROVIDER_SITE_OTHER): Payer: Federal, State, Local not specified - PPO | Admitting: Cardiovascular Disease

## 2013-03-30 VITALS — BP 106/86 | HR 88 | Ht 67.0 in | Wt 122.0 lb

## 2013-03-30 DIAGNOSIS — Z950 Presence of cardiac pacemaker: Secondary | ICD-10-CM

## 2013-03-30 DIAGNOSIS — I455 Other specified heart block: Secondary | ICD-10-CM

## 2013-03-30 DIAGNOSIS — Z8249 Family history of ischemic heart disease and other diseases of the circulatory system: Secondary | ICD-10-CM

## 2013-03-30 DIAGNOSIS — I150 Renovascular hypertension: Secondary | ICD-10-CM

## 2013-03-30 LAB — MDC_IDC_ENUM_SESS_TYPE_INCLINIC
Battery Impedance: 100 Ohm
Battery Remaining Longevity: 13.5
Battery Voltage: 2.8 V
Brady Statistic AP VP Percent: 0.8 %
Brady Statistic AP VS Percent: 4.5 %
Brady Statistic AS VP Percent: 5.9 %
Brady Statistic AS VS Percent: 88.9 %
Lead Channel Impedance Value: 466 Ohm
Lead Channel Impedance Value: 584 Ohm
Lead Channel Pacing Threshold Amplitude: 0.5 V
Lead Channel Pacing Threshold Amplitude: 1.25 V
Lead Channel Pacing Threshold Pulse Width: 0.4 ms
Lead Channel Pacing Threshold Pulse Width: 0.4 ms
Lead Channel Sensing Intrinsic Amplitude: 4 mV
Lead Channel Sensing Intrinsic Amplitude: 5.6 mV
Lead Channel Setting Pacing Amplitude: 3.5 V
Lead Channel Setting Pacing Amplitude: 3.5 V
Lead Channel Setting Pacing Pulse Width: 0.4 ms
Lead Channel Setting Sensing Sensitivity: 2 mV

## 2013-03-30 LAB — PACEMAKER DEVICE OBSERVATION

## 2013-03-30 NOTE — Progress Notes (Signed)
Patient ID: [name], female   DOB: [birth year], 58 y.o.   MRN: [id number]      Reason for office visit Followup syncope/pacemaker  Mrs. Greenstein is now roughly one month following implantation of a dual-chamber permanent pacemaker for a history of syncope and prolonged sinus pauses in excess of 3 seconds duration. It was not entirely clear whether her sinus node arrest was a separate phenomenon or part of neurally mediated syncope. She has not had any new syncopal events since the pacemaker was implanted. She has very infrequent pacing on device interrogation (5% atrial pacing virtually no ventricular pacing). The surgical site has healed very nicely. She is still planning to go to help her daughter with her new grandchild, out of state.  Her daughter did have a previous stillbirth and the child underwent genetic testing that showed dmp mutation for ARVC. The daughter has subsequently also undergone genetic testing and is also positive for this dictation. 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   Allergies  Allergen Reactions  . Penicillins Other (See Comments)    Yeast infection    Current Outpatient Prescriptions  Medication Sig Dispense Refill  . ALPRAZolam (XANAX) 0.5 MG tablet Take 0.25 mg by mouth as needed for anxiety.       . mirtazapine (REMERON) 30 MG tablet Take 30 mg by mouth at bedtime.      . sertraline (ZOLOFT) 100 MG tablet Take 100 mg by mouth daily.      . traMADol (ULTRAM) 50 MG tablet        No current facility-administered medications for this visit.    Past Medical History  Diagnosis Date  . Syncope and collapse     echo-April 12,2012-EF 55% Echo normal  . Anxiety     anxiety and mild depressive order systoms  . Vertigo   . Tobacco abuse   . Sinus pause 02/14/2013  . Pacemaker   . WEXHBZJI(967.8)     "monthly" (02/20/2013)    . Bipolar disorder     "dx'd ~ 1 wk ago" (02/20/2013)    Past Surgical History  Procedure Laterality Date  . Transthoracic echocardiogram  07/11/2010    The left atrial size is normal.There is no evidence of mitral vavle prolaspe.Right ventriicular systolic pressure is normal . Injection of contrast documented no interatrial shunt. Essentially normal 2D echo -doppler study   . Abdominal hysterectomy  03/19/1980    "partial"  . Insert / replace / remove pacemaker  02/20/2013  . Posterior lumbar fusion  ~ 2009  . Back surgery      Family History  Problem Relation Age of Onset  . Adopted: Yes  . Heart attack Brother     History   Social History  . Marital Status: Married    Spouse Name: N/A    Number of Children: N/A  . Years of Education: N/A   Occupational History  . Not on file.   Social History Main Topics  . Smoking status: Current Every Day Smoker -- 1.00 packs/day for 40 years    Types: Cigarettes  . Smokeless tobacco: Never Used  . Alcohol Use: No  . Drug Use: No  . Sexual Activity: Yes   Other Topics Concern  . Not on file   Social History Narrative   Married 29 years . Has 3 children. Current smoker -1 pack a  day-for 36 years .Alcohol :  1 beer on occasion.   She is soon to be a grandmother.  His before to going on a [place] to be closer to family when the baby is born.  She is extremely excited.   She very much would like to move back up to that area to be closer to family, but the whether is it just too cold in the winter.    Review of systems: The patient specifically denies any chest pain at rest or with exertion, dyspnea at rest or with exertion, orthopnea, paroxysmal nocturnal dyspnea, syncope, palpitations, focal neurological deficits, intermittent claudication, lower extremity edema, unexplained weight gain, cough, hemoptysis or wheezing.  The patient also denies abdominal pain, nausea, vomiting, dysphagia, diarrhea,  constipation, polyuria, polydipsia, dysuria, hematuria, frequency, urgency, abnormal bleeding or bruising, fever, chills, unexpected weight changes, mood swings, change in skin or hair texture, change in voice quality, auditory or visual problems, allergic reactions or rashes, new musculoskeletal complaints other than usual "aches and pains".   PHYSICAL EXAM BP 106/86  Pulse 88  Ht _0  (1.702 m)  Wt 122 lb (55.339 kg)  BMI 19.10 kg/m2  General: Alert, oriented x3, no distress Head: no evidence of trauma, PERRL, EOMI, no exophtalmos or lid lag, no myxedema, no xanthelasma; normal ears, nose and oropharynx Neck: normal jugular venous pulsations and no hepatojugular reflux; brisk carotid pulses without delay and no carotid bruits Chest: clear to auscultation, no signs of consolidation by percussion or palpation, normal fremitus, symmetrical and full respiratory excursions. The pacemaker scar in the explanted loop recorder scar look healthy Cardiovascular: normal position and quality of the apical impulse, regular rhythm, normal first and second heart sounds, no murmurs, rubs or gallops Abdomen: no tenderness or distention, no masses by palpation, no abnormal pulsatility or arterial bruits, normal bowel sounds, no hepatosplenomegaly Extremities: no clubbing, cyanosis or edema; 2+ radial, ulnar and brachial pulses bilaterally; 2+ right femoral, posterior tibial and dorsalis pedis pulses; 2+ left femoral, posterior tibial and dorsalis pedis pulses; no subclavian or femoral bruits Neurological: grossly nonfocal   Lipid Panel  No results found for this basename: chol, trig, hdl, cholhdl, vldl, ldlcalc    BMET    Component Value Date/Time   NA 141 02/16/2013 1345   K 4.6 02/16/2013 1345   CL 107 02/16/2013 1345   CO2 27 02/16/2013 1345   GLUCOSE 99 02/16/2013 1345   BUN 10 02/16/2013 1345   CREATININE 0.84 02/16/2013 1345   CREATININE 0.80 01/08/2008 1044   CALCIUM 9.6 02/16/2013 1345   GFRNONAA  >60 01/08/2008 1044   GFRAA  Value: >60        The eGFR has been calculated using the MDRD equation. This calculation has not been validated in all clinical 01/08/2008 1044     ASSESSMENT AND PLAN No problem-specific assessment & plan notes found for this encounter.  Orders Placed This Encounter  Procedures  . Implantable device check  . Renal Artery Duplex Bilateral   Meds ordered this encounter  Medications  . traMADol (ULTRAM) 50 MG tablet    Sig:     Holli Humbles, MD, [facility] [phone] office [phone] pager

## 2013-03-30 NOTE — Assessment & Plan Note (Signed)
Comprehensive check in the office today shows normal device function. Device reprogrammed to chronic settings. Instructed the patient in remote [employer] downloads and provided her with a filter for a digital line at her daughter's house. Remote checks every 3 months and followup in the office in 12 months.  No tachyarrhythmia has been recorded.

## 2013-03-30 NOTE — Assessment & Plan Note (Signed)
Her other daughter is planning to have genetic testing as well but his history is is uncertain whether she wants to whether she is a gene carrier not. She has never had serious ventricular arrhythmia recorded clinically, by her loop recorder or in the month since she has had a pacemaker. It is not unreasonable to have a conservative approach. There is also no family history of sudden death, except for the fetus that passed away.

## 2013-03-30 NOTE — Patient Instructions (Signed)
Remote monitoring is used to monitor your pacemaker from home. This monitoring reduces the number of office visits required to check your device to one time per year. It allows [place] to keep an eye on the functioning of your device to ensure it is working properly. You are scheduled for a device check from home on 07-01-2013. You may send your transmission at any time that day. If you have a wireless device, the transmission will be sent automatically. After your physician reviews your transmission, you will receive a postcard with your next transmission date.  Your physician recommends that you schedule a follow-up appointment in: 12 months with Dr.Croitoru

## 2013-04-01 ENCOUNTER — Telehealth (HOSPITAL_COMMUNITY): Payer: Self-pay | Admitting: *Deleted

## 2013-04-01 NOTE — Telephone Encounter (Signed)
spoke to patient. RN informed patient ,will have to discuss with Dr Sallyanne Kuster Thayer Headings L. RN Valda Lamb. CMA Will notify patient if she needs test or not. She verbalized understanding.

## 2013-04-01 NOTE — Telephone Encounter (Signed)
I did not intend to order one. I think it was entered instead of a different patient

## 2013-04-01 NOTE — Telephone Encounter (Signed)
SPOKE TO PATIENT. INFORMED HER THE RENAL DOPPLER IS NOT NEEEDED. CANCELLED DOPPLER

## 2013-04-01 NOTE — Telephone Encounter (Signed)
Pt states that she had no idea that Dr. Loletha Grayer was ordering a renal artery duplex. She would like to know why she needs this done.  [employer]

## 2013-06-07 ENCOUNTER — Emergency Department (HOSPITAL_COMMUNITY): Payer: Federal, State, Local not specified - PPO

## 2013-06-07 ENCOUNTER — Emergency Department (HOSPITAL_COMMUNITY)
Admission: EM | Admit: 2013-06-07 | Discharge: 2013-06-07 | Disposition: A | Discharge status: Home / Self Care | Payer: Federal, State, Local not specified - PPO | Attending: Emergency Medicine | Admitting: Emergency Medicine

## 2013-06-07 ENCOUNTER — Encounter (HOSPITAL_COMMUNITY): Payer: Self-pay | Admitting: Emergency Medicine

## 2013-06-07 DIAGNOSIS — R296 Repeated falls: Secondary | ICD-10-CM | POA: Insufficient documentation

## 2013-06-07 DIAGNOSIS — S4980XA Other specified injuries of shoulder and upper arm, unspecified arm, initial encounter: Secondary | ICD-10-CM | POA: Insufficient documentation

## 2013-06-07 DIAGNOSIS — S0993XA Unspecified injury of face, initial encounter: Secondary | ICD-10-CM | POA: Insufficient documentation

## 2013-06-07 DIAGNOSIS — F319 Bipolar disorder, unspecified: Secondary | ICD-10-CM | POA: Insufficient documentation

## 2013-06-07 DIAGNOSIS — Y9389 Activity, other specified: Secondary | ICD-10-CM | POA: Insufficient documentation

## 2013-06-07 DIAGNOSIS — S199XXA Unspecified injury of neck, initial encounter: Secondary | ICD-10-CM

## 2013-06-07 DIAGNOSIS — M79602 Pain in left arm: Secondary | ICD-10-CM

## 2013-06-07 DIAGNOSIS — F411 Generalized anxiety disorder: Secondary | ICD-10-CM | POA: Insufficient documentation

## 2013-06-07 DIAGNOSIS — F17200 Nicotine dependence, unspecified, uncomplicated: Secondary | ICD-10-CM | POA: Insufficient documentation

## 2013-06-07 DIAGNOSIS — S46909A Unspecified injury of unspecified muscle, fascia and tendon at shoulder and upper arm level, unspecified arm, initial encounter: Secondary | ICD-10-CM | POA: Insufficient documentation

## 2013-06-07 DIAGNOSIS — Z950 Presence of cardiac pacemaker: Secondary | ICD-10-CM | POA: Insufficient documentation

## 2013-06-07 DIAGNOSIS — Z880 Allergy status to penicillin: Secondary | ICD-10-CM | POA: Insufficient documentation

## 2013-06-07 DIAGNOSIS — Z79899 Other long term (current) drug therapy: Secondary | ICD-10-CM | POA: Insufficient documentation

## 2013-06-07 DIAGNOSIS — Y929 Unspecified place or not applicable: Secondary | ICD-10-CM | POA: Insufficient documentation

## 2013-06-07 DIAGNOSIS — W19XXXA Unspecified fall, initial encounter: Secondary | ICD-10-CM

## 2013-06-07 DIAGNOSIS — Z8679 Personal history of other diseases of the circulatory system: Secondary | ICD-10-CM | POA: Insufficient documentation

## 2013-06-07 MED ORDER — OXYCODONE-ACETAMINOPHEN 5-325 MG PO TABS: 1.0000 | ORAL_TABLET | ORAL | Status: DC | PRN

## 2013-06-07 MED ORDER — OXYCODONE-ACETAMINOPHEN 5-325 MG PO TABS
1.0000 | ORAL_TABLET | Freq: Once | ORAL | Status: AC
  Administered 2013-06-07: 1 via ORAL
  Filled 2013-06-07: qty 1

## 2013-06-07 NOTE — ED Provider Notes (Signed)
CSN: [id number]     Arrival date & time 06/07/13  1452 History   First MD Initiated Contact with Patient 06/07/13 1536     Chief Complaint  Patient presents with  . Fall  . Arm Pain     (Consider location/radiation/quality/duration/timing/severity/associated sxs/prior Treatment) Patient is a 58 y.o. female presenting with fall and arm pain. The history is provided by the patient. No language interpreter was used.  Fall This is a new problem. The current episode started in the past 7 days. Pertinent negatives include no chills, fever, nausea, numbness, vomiting or weakness. Associated symptoms comments: She fell one week ago after a wooden chair she was sitting in collapsed, causing her to fall onto her left shoulder. She experienced pain at that time that persists, now with tingling into her hand including all 5 digits. No weakness. Pain is located to left lateral neck, minimal midline soreness, left shoulder that extends into entire left arm. She denies head injury, headache, dizziness, chest pain, breathing difficulty..  Arm Pain Pertinent negatives include no chills, fever, nausea, numbness, vomiting or weakness.    Past Medical History  Diagnosis Date  . Syncope and collapse     echo-April 12,2012-EF 55% Echo normal  . Anxiety     anxiety and mild depressive order systoms  . Vertigo   . Tobacco abuse   . Sinus pause 02/14/2013  . Pacemaker   . NWGNFAOZ(308.6)     "monthly" (02/20/2013)  . Bipolar disorder     "dx'd ~ 1 wk ago" (02/20/2013)   Past Surgical History  Procedure Laterality Date  . Transthoracic echocardiogram  07/11/2010    The left atrial size is normal.There is no evidence of mitral vavle prolaspe.Right ventriicular systolic pressure is normal . Injection of contrast documented no interatrial shunt. Essentially normal 2D echo -doppler study   . Abdominal hysterectomy  03/19/1980    "partial"  . Insert / replace / remove pacemaker  02/20/2013  . Posterior lumbar  fusion  ~ 2009  . Back surgery     Family History  Problem Relation Age of Onset  . Adopted: Yes  . Heart attack Brother    History  Substance Use Topics  . Smoking status: Current Every Day Smoker -- 1.00 packs/day for 40 years    Types: Cigarettes  . Smokeless tobacco: Never Used  . Alcohol Use: No   OB History   Grav Para Term Preterm Abortions TAB SAB Ect Mult Living                 Review of Systems  Constitutional: Negative for fever and chills.  Respiratory: Negative.   Cardiovascular: Negative.   Gastrointestinal: Negative.  Negative for nausea and vomiting.  Musculoskeletal:       See HPI.  Skin: Negative.  Negative for wound.  Neurological: Negative for weakness and numbness.       Tingling into fingers.      Allergies  Penicillins  Home Medications   Current Outpatient Rx  Name  Route  Sig  Dispense  Refill  . ALPRAZolam (XANAX) 0.5 MG tablet   Oral   Take 0.25-0.5 mg by mouth 2 (two) times daily as needed for anxiety.          Marland Kitchen ibuprofen (ADVIL,MOTRIN) 200 MG tablet   Oral   Take 400 mg by mouth every 6 (six) hours as needed for mild pain or moderate pain.         . mirtazapine (REMERON) 30 MG  tablet   Oral   Take 30 mg by mouth at bedtime.         . sertraline (ZOLOFT) 100 MG tablet   Oral   Take 100 mg by mouth daily.         . traMADol (ULTRAM) 50 MG tablet   Oral   Take 50 mg by mouth 3 (three) times daily as needed for moderate pain.           BP 138/82  Pulse 77  Temp(Src) 98.9 F (37.2 C) (Oral)  SpO2 100% Physical Exam  Constitutional: She is oriented to person, place, and time. She appears well-developed and well-nourished.  Neck: Normal range of motion.  Cardiovascular: Intact distal pulses.   Pulmonary/Chest: Effort normal.  Musculoskeletal:  Mild midline cervical tenderness. Left lateral neck tender with tense muscle, no swelling or discoloration. 5/5 grip strength that is equal bilateral UE's. No AC deformity  or drop off. No scapular tenderness. Humeral head tender but with FROM. Left UE has full strength and is nontender.   Neurological: She is alert and oriented to person, place, and time.  Skin: Skin is warm and dry.    ED Course  Procedures (including critical care time) Labs Review Labs Reviewed - No data to display Imaging Review No results found.   EKG Interpretation None     Dg Cervical Spine Complete  06/07/2013   CLINICAL DATA:  Injury 4 days ago.  Pain radiating to the left.  EXAM: CERVICAL SPINE  4+ VIEWS  COMPARISON:  None.  FINDINGS: There is straightening in the midcervical region. No soft tissue swelling. There is chronic spondylosis at C5-6 with disc space narrowing and endplate osteophytes. There is mild foraminal encroachment bilaterally. No fracture or other focal finding.  IMPRESSION: Chronic appearing spondylosis at C5-6. No acute or traumatic finding.   Electronically Signed   By: Nelson Chimes M.D.   On: 06/07/2013 17:20   Dg Shoulder Left  06/07/2013   CLINICAL DATA:  Fall.  Left shoulder pain.  EXAM: LEFT SHOULDER - 2+ VIEW  COMPARISON:  None.  FINDINGS: There is no evidence of fracture or dislocation. There is no evidence of arthropathy or other focal bone abnormality. Soft tissues are unremarkable. Transvenous pacemaker noted in left anterior chest wall.  IMPRESSION: Negative left shoulder radiographs.   Electronically Signed   By: Earle Gell M.D.   On: 06/07/2013 17:36    MDM   Final diagnoses:  None    1. Left neck pain 2. Left shoulder pain  Negative x-rays after a fall occurring 7 days prior to arrival. Tingling into left hand that does not follow radicular pattern. No weakness. No other neurologic deficits. Stable for discharge to follow up with PCP. Limited # of Percocet given her Pain Management history.     Dewaine Oats, PA-C 06/07/13 1745

## 2013-06-07 NOTE — Discharge Instructions (Signed)
Cryotherapy Cryotherapy means treatment with cold. Ice or gel packs can be used to reduce both pain and swelling. Ice is the most helpful within the first 24 to 48 hours after an injury or flareup from overusing a muscle or joint. Sprains, strains, spasms, burning pain, shooting pain, and aches can all be eased with ice. Ice can also be used when recovering from surgery. Ice is effective, has very few side effects, and is safe for most people to use. PRECAUTIONS  Ice is not a safe treatment option for people with:  Raynaud's phenomenon. This is a condition affecting small blood vessels in the extremities. Exposure to cold may cause your problems to return.  Cold hypersensitivity. There are many forms of cold hypersensitivity, including:  Cold urticaria. Red, itchy hives appear on the skin when the tissues begin to warm after being iced.  Cold erythema. This is a red, itchy rash caused by exposure to cold.  Cold hemoglobinuria. Red blood cells break down when the tissues begin to warm after being iced. The hemoglobin that carry oxygen are passed into the urine because they cannot combine with blood proteins fast enough.  Numbness or altered sensitivity in the area being iced. If you have any of the following conditions, do not use ice until you have discussed cryotherapy with your caregiver:  Heart conditions, such as arrhythmia, angina, or chronic heart disease.  High blood pressure.  Healing wounds or open skin in the area being iced.  Current infections.  Rheumatoid arthritis.  Poor circulation.  Diabetes. Ice slows the blood flow in the region it is applied. This is beneficial when trying to stop inflamed tissues from spreading irritating chemicals to surrounding tissues. However, if you expose your skin to cold temperatures for too long or without the proper protection, you can damage your skin or nerves. Watch for signs of skin damage due to cold. HOME CARE INSTRUCTIONS Follow  these tips to use ice and cold packs safely.  Place a dry or damp towel between the ice and skin. A damp towel will cool the skin more quickly, so you may need to shorten the time that the ice is used.  For a more rapid response, add gentle compression to the ice.  Ice for no more than 10 to 20 minutes at a time. The bonier the area you are icing, the less time it will take to get the benefits of ice.  Check your skin after 5 minutes to make sure there are no signs of a poor response to cold or skin damage.  Rest 20 minutes or more in between uses.  Once your skin is numb, you can end your treatment. You can test numbness by very lightly touching your skin. The touch should be so light that you do not see the skin dimple from the pressure of your fingertip. When using ice, most people will feel these normal sensations in this order: cold, burning, aching, and numbness.  Do not use ice on someone who cannot communicate their responses to pain, such as small children or people with dementia. HOW TO MAKE AN ICE PACK Ice packs are the most common way to use ice therapy. Other methods include ice massage, ice baths, and cryo-sprays. Muscle creams that cause a cold, tingly feeling do not offer the same benefits that ice offers and should not be used as a substitute unless recommended by your caregiver. To make an ice pack, do one of the following:  Place crushed ice or  a bag of frozen vegetables in a sealable plastic bag. Squeeze out the excess air. Place this bag inside another plastic bag. Slide the bag into a pillowcase or place a damp towel between your skin and the bag.  Mix 3 parts water with 1 part rubbing alcohol. Freeze the mixture in a sealable plastic bag. When you remove the mixture from the freezer, it will be slushy. Squeeze out the excess air. Place this bag inside another plastic bag. Slide the bag into a pillowcase or place a damp towel between your skin and the bag. SEEK MEDICAL  CARE IF:  You develop white spots on your skin. This may give the skin a blotchy (mottled) appearance.  Your skin turns blue or pale.  Your skin becomes waxy or hard.  Your swelling gets worse. MAKE SURE YOU:   Understand these instructions.  Will watch your condition.  Will get help right away if you are not doing well or get worse. Document Released: 10/30/2010 Document Revised: 05/28/2011 Document Reviewed: 10/30/2010 [facility] Patient Information 2014 [place], [place]. Muscle Strain A muscle strain is an injury that occurs when a muscle is stretched beyond its normal length. Usually a small number of muscle fibers are torn when this happens. Muscle strain is rated in degrees. First-degree strains have the least amount of muscle fiber tearing and pain. Second-degree and third-degree strains have increasingly more tearing and pain.  Usually, recovery from muscle strain takes 1 2 weeks. Complete healing takes 5 6 weeks.  CAUSES  Muscle strain happens when a sudden, violent force placed on a muscle stretches it too far. This may occur with lifting, sports, or a fall.  RISK FACTORS Muscle strain is especially common in athletes.  SIGNS AND SYMPTOMS At the site of the muscle strain, there may be:  Pain.  Bruising.  Swelling.  Difficulty using the muscle due to pain or lack of normal function. DIAGNOSIS  Your health care provider will perform a physical exam and ask about your medical history. TREATMENT  Often, the best treatment for a muscle strain is resting, icing, and applying cold compresses to the injured area.  HOME CARE INSTRUCTIONS   Use the PRICE method of treatment to promote muscle healing during the first 2 3 days after your injury. The PRICE method involves:  Protecting the muscle from being injured again.  Restricting your activity and resting the injured body part.  Icing your injury. To do this, put ice in a plastic bag. Place a towel between your skin and  the bag. Then, apply the ice and leave it on from 15 20 minutes each hour. After the third day, switch to moist heat packs.  Apply compression to the injured area with a splint or elastic bandage. Be careful not to wrap it too tightly. This may interfere with blood circulation or increase swelling.  Elevate the injured body part above the level of your heart as often as you can.  Only take over-the-counter or prescription medicines for pain, discomfort, or fever as directed by your health care provider.  Warming up prior to exercise helps to prevent future muscle strains. SEEK MEDICAL CARE IF:   You have increasing pain or swelling in the injured area.  You have numbness, tingling, or a significant loss of strength in the injured area. MAKE SURE YOU:   Understand these instructions.  Will watch your condition.  Will get help right away if you are not doing well or get worse. Document Released: 03/05/2005 Document  Revised: 12/24/2012 Document Reviewed: 10/02/2012 [facility] Patient Information 2014 [place], [place].

## 2013-06-07 NOTE — ED Notes (Signed)
She states she has had left-sided neck pain radiating down her left arm; plus c/o paresthesias of all left fingers.

## 2013-06-07 NOTE — ED Provider Notes (Signed)
History/physical exam/procedure(s) were performed by non-physician practitioner and as supervising physician I was immediately available for consultation/collaboration. I have reviewed all notes and am in agreement with care and plan.   Shaune Pollack, MD 06/07/13 [phone]

## 2013-06-08 ENCOUNTER — Telehealth: Payer: Self-pay | Admitting: *Deleted

## 2013-06-08 NOTE — Telephone Encounter (Signed)
Note sent over by Dr. Nelva Bush making sure it is OK to implant a spinal cord stimulator with a pacemaker.  Dr. Loletha Grayer review and said it is OK but a rep from the pacemaker company needs to be present during the implant.  Patient voiced understanding and said Dr. Nelva Bush would arrange this.

## 2013-07-01 ENCOUNTER — Ambulatory Visit (INDEPENDENT_AMBULATORY_CARE_PROVIDER_SITE_OTHER): Payer: Federal, State, Local not specified - PPO | Admitting: *Deleted

## 2013-07-01 DIAGNOSIS — I455 Other specified heart block: Secondary | ICD-10-CM

## 2013-07-01 DIAGNOSIS — Z8249 Family history of ischemic heart disease and other diseases of the circulatory system: Secondary | ICD-10-CM

## 2013-07-01 LAB — MDC_IDC_ENUM_SESS_TYPE_REMOTE
Battery Impedance: 100 Ohm
Battery Remaining Longevity: 161 mo
Battery Voltage: 2.8 V
Brady Statistic AP VP Percent: 0 %
Brady Statistic AP VS Percent: 9 %
Brady Statistic AS VP Percent: 0 %
Brady Statistic AS VS Percent: 91 %
Date Time Interrogation Session: 20150415180520
Lead Channel Impedance Value: 465 Ohm
Lead Channel Impedance Value: 506 Ohm
Lead Channel Pacing Threshold Amplitude: 0.375 V
Lead Channel Pacing Threshold Amplitude: 1 V
Lead Channel Pacing Threshold Pulse Width: 0.4 ms
Lead Channel Pacing Threshold Pulse Width: 0.4 ms
Lead Channel Sensing Intrinsic Amplitude: 0.7 mV
Lead Channel Sensing Intrinsic Amplitude: 5.6 mV
Lead Channel Setting Pacing Amplitude: 2 V
Lead Channel Setting Pacing Amplitude: 2.5 V
Lead Channel Setting Pacing Pulse Width: 0.4 ms
Lead Channel Setting Sensing Sensitivity: 2 mV

## 2013-07-02 ENCOUNTER — Other Ambulatory Visit: Payer: Self-pay | Admitting: Physical Medicine and Rehabilitation

## 2013-07-02 DIAGNOSIS — M5030 Other cervical disc degeneration, unspecified cervical region: Secondary | ICD-10-CM

## 2013-07-06 ENCOUNTER — Ambulatory Visit
Admission: RE | Admit: 2013-07-06 | Discharge: 2013-07-06 | Disposition: A | Discharge status: Home / Self Care | Payer: Federal, State, Local not specified - PPO | Source: Ambulatory Visit | Attending: Physical Medicine and Rehabilitation | Admitting: Physical Medicine and Rehabilitation

## 2013-07-06 DIAGNOSIS — M5030 Other cervical disc degeneration, unspecified cervical region: Secondary | ICD-10-CM

## 2013-07-10 ENCOUNTER — Encounter: Payer: Self-pay | Admitting: *Deleted

## 2013-07-15 ENCOUNTER — Encounter: Payer: Self-pay | Admitting: Cardiovascular Disease

## 2013-08-19 ENCOUNTER — Ambulatory Visit (INDEPENDENT_AMBULATORY_CARE_PROVIDER_SITE_OTHER): Payer: Federal, State, Local not specified - PPO | Admitting: Cardiology

## 2013-08-19 ENCOUNTER — Encounter: Payer: Self-pay | Admitting: Cardiology

## 2013-08-19 VITALS — BP 138/85 | HR 83 | Ht 68.0 in | Wt 122.0 lb

## 2013-08-19 DIAGNOSIS — Z950 Presence of cardiac pacemaker: Secondary | ICD-10-CM

## 2013-08-19 DIAGNOSIS — R42 Dizziness and giddiness: Secondary | ICD-10-CM

## 2013-08-19 DIAGNOSIS — F17200 Nicotine dependence, unspecified, uncomplicated: Secondary | ICD-10-CM

## 2013-08-19 DIAGNOSIS — Z720 Tobacco use: Secondary | ICD-10-CM

## 2013-08-19 DIAGNOSIS — R55 Syncope and collapse: Secondary | ICD-10-CM

## 2013-08-19 DIAGNOSIS — Z87898 Personal history of other specified conditions: Secondary | ICD-10-CM

## 2013-08-19 DIAGNOSIS — Z9189 Other specified personal risk factors, not elsewhere classified: Secondary | ICD-10-CM

## 2013-08-19 DIAGNOSIS — I455 Other specified heart block: Secondary | ICD-10-CM

## 2013-08-19 NOTE — Patient Instructions (Signed)
No changes with current medication  Your physician wants you to follow-up in 12 months Dr Ellyn Hack. You will receive a reminder letter in the mail two months in advance. If you don't receive a letter, please call our office to schedule the follow-up appointment.

## 2013-08-20 NOTE — Progress Notes (Signed)
PCP: Sherian Maroon, MD  Clinic Note: Chief Complaint  Patient presents with  . Appointment    sob with exer    HPI: [name] is a 58 y.o. female with a Cardiovascular Problem List below who presents today for her initial followup with me after having had a pacemaker placed by Dr. Sallyanne Kuster back in December 2014. She had a relatively significant episode of syncope and was found to have prolonged sinus pauses on loop recorder.  She has a history in her family with her daughter and stillborn grandchild who had DMP mutation for ARVC.    Interval History:  her last visit with Dr. Sallyanne Kuster was in January. Thankfully she has not had any further syncopal episodes since the pacemaker placement. The only major problem she's had over the last has been vertigo with dizziness. She is seeing ENT physicians as well as her PCP without any true assistance other than meclizine.  otherwise, she really denies any significant palpitations, or near syncope. No TIA or amaurosis fugax symptoms. She has not had any chest tightness or pressure with rest or exertion, but does note getting short of breath she going up a flight of steps or up a hill.. Additional dizziness but nothing significant. No otherwise TIA or amaurosis fugax symptoms. No melena hematochezia or hematuria or epistaxis. No PND, orthopnea or edema. No claudication.  Past Medical History  Diagnosis Date  . Syncope and collapse     echo-April 12,2012-EF 55% Echo normal; recorder with prolonged sinus pauses --> status post  . Anxiety     anxiety and mild depressive order systoms  . Vertigo   . Tobacco abuse   . Sinus pause 02/14/2013  . Pacemaker   . WGYKZLDJ(570.1)     "monthly" (02/20/2013)  . Bipolar disorder      (02/20/2013)    Prior Cardiac Evaluation and Past Surgical History: Procedure Laterality Date  . Transthoracic echocardiogram  07/11/2010    The left atrial size is normal.There is no evidence of mitral vavle  prolaspe.Right ventriicular systolic pressure is normal . Injection of contrast documented no interatrial shunt. Essentially normal 2D echo -doppler study   . Insert / replace / remove pacemaker  02/20/2013    MEDICATIONS AND ALLERGIES REVIEWED IN EPIC No Change in Social and Family History  ROS: A comprehensive Review of Systems - Negative except Anxiety, vertigo, arthralgias. Otherwise benign.  PHYSICAL EXAM BP 138/85  Pulse 83  Ht 5\' 8"  (1.727 m)  Wt 122 lb (55.339 kg)  BMI 18.55 kg/m2 General appearance: alert, cooperative, appears stated age, no distress; pleasant mood and affect  Neck: no adenopathy, no carotid bruit and no JVD Lungs: clear to auscultation bilaterally, normal percussion bilaterally and non-labored Heart:  RRR, normal S1 and S2, no M./R./G.   nondisplaced PMI  Abdomen: soft, non-tender; bowel sounds normal; no masses,  no organomegaly;  Extremities: extremities normal, atraumatic, no cyanosis or edema  Pulses: 2+ and symmetric Neurologic: Mental status: Alert, oriented, thought content appropriate Cranial nerves: normal (II-XII grossly intact)   Adult ECG Report  Rate: 82 ;  Rhythm: Atrial paced rhythm; otherwise normal EKG  Recent Labs -- none available    ASSESSMENT / PLAN: Near syncope No more episodes now that she has had a pacemaker placed.  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 Followed by Dr. Sallyanne Kuster. On last check was functioning correctly.  Sinus pause Status post pacemaker placement.  Tobacco abuse Again we talked about smoking cessation counseling for least 7  minutes. In his thinking but was not interested. She says she does not smoke she becomes extremely anxious.  Vertigo This is still her major symptom she complains of. She's been to see multiple specialists. We have tried meclizine in the past and it continues to be the only thing that provides some relief.    Orders Placed This Encounter  Procedures  . EKG 12-Lead     Followup:  1 year   Malaia Buchta W. Ellyn Hack, M.D., M.S. Interventional Cardiologist [facility]

## 2013-08-21 ENCOUNTER — Encounter: Payer: Self-pay | Admitting: Cardiology

## 2013-08-21 NOTE — Assessment & Plan Note (Signed)
No more episodes now that she has had a pacemaker placed.

## 2013-08-21 NOTE — Assessment & Plan Note (Signed)
Status post pacemaker placement. ?

## 2013-08-21 NOTE — Assessment & Plan Note (Signed)
This is still her major symptom she complains of. She's been to see multiple specialists. We have tried meclizine in the past and it continues to be the only thing that provides some relief.

## 2013-08-21 NOTE — Assessment & Plan Note (Signed)
Again we talked about smoking cessation counseling for least 7 minutes. In his thinking but was not interested. She says she does not smoke she becomes extremely anxious.

## 2013-08-21 NOTE — Assessment & Plan Note (Signed)
Followed by Dr. Sallyanne Kuster. On last check was functioning correctly.

## 2013-10-02 DIAGNOSIS — I455 Other specified heart block: Secondary | ICD-10-CM

## 2013-10-05 ENCOUNTER — Ambulatory Visit (INDEPENDENT_AMBULATORY_CARE_PROVIDER_SITE_OTHER): Payer: Federal, State, Local not specified - PPO | Admitting: *Deleted

## 2013-10-05 DIAGNOSIS — I455 Other specified heart block: Secondary | ICD-10-CM

## 2013-10-05 NOTE — Progress Notes (Signed)
Remote pacemaker transmission.   

## 2013-10-06 ENCOUNTER — Other Ambulatory Visit: Payer: Self-pay | Admitting: Specialist

## 2013-10-06 ENCOUNTER — Other Ambulatory Visit: Payer: Self-pay | Admitting: Physical Medicine and Rehabilitation

## 2013-10-06 ENCOUNTER — Ambulatory Visit
Admission: RE | Admit: 2013-10-06 | Discharge: 2013-10-06 | Disposition: A | Discharge status: Home / Self Care | Payer: Federal, State, Local not specified - PPO | Source: Ambulatory Visit | Attending: Specialist | Admitting: Specialist

## 2013-10-06 DIAGNOSIS — M542 Cervicalgia: Secondary | ICD-10-CM

## 2013-10-07 ENCOUNTER — Other Ambulatory Visit: Payer: Self-pay | Admitting: Physical Medicine and Rehabilitation

## 2013-10-07 DIAGNOSIS — M79601 Pain in right arm: Secondary | ICD-10-CM

## 2013-10-07 DIAGNOSIS — M79602 Pain in left arm: Secondary | ICD-10-CM

## 2013-10-07 DIAGNOSIS — M542 Cervicalgia: Secondary | ICD-10-CM

## 2013-10-12 LAB — MDC_IDC_ENUM_SESS_TYPE_REMOTE
Battery Impedance: 100 Ohm
Battery Remaining Longevity: 161 mo
Battery Voltage: 2.79 V
Brady Statistic AP VP Percent: 0 %
Brady Statistic AP VS Percent: 8 %
Brady Statistic AS VP Percent: 0 %
Brady Statistic AS VS Percent: 91 %
Date Time Interrogation Session: 20150717103453
Lead Channel Impedance Value: 453 Ohm
Lead Channel Impedance Value: 522 Ohm
Lead Channel Pacing Threshold Amplitude: 0.375 V
Lead Channel Pacing Threshold Amplitude: 1.25 V
Lead Channel Pacing Threshold Pulse Width: 0.4 ms
Lead Channel Pacing Threshold Pulse Width: 0.4 ms
Lead Channel Sensing Intrinsic Amplitude: 11.2 mV
Lead Channel Sensing Intrinsic Amplitude: 2.8 mV
Lead Channel Setting Pacing Amplitude: 2 V
Lead Channel Setting Pacing Amplitude: 2.5 V
Lead Channel Setting Pacing Pulse Width: 0.4 ms
Lead Channel Setting Sensing Sensitivity: 2 mV

## 2013-10-20 ENCOUNTER — Encounter: Payer: Self-pay | Admitting: Cardiology

## 2013-10-23 ENCOUNTER — Encounter: Payer: Self-pay | Admitting: Cardiovascular Disease

## 2013-10-28 ENCOUNTER — Other Ambulatory Visit: Payer: Self-pay | Admitting: Neurological Surgery

## 2013-10-28 DIAGNOSIS — M5090 Cervical disc disorder, unspecified, unspecified cervical region: Secondary | ICD-10-CM

## 2013-11-05 ENCOUNTER — Ambulatory Visit
Admission: RE | Admit: 2013-11-05 | Discharge: 2013-11-05 | Disposition: A | Discharge status: Home / Self Care | Payer: Federal, State, Local not specified - PPO | Source: Ambulatory Visit | Attending: Neurological Surgery | Admitting: Neurological Surgery

## 2013-11-05 VITALS — BP 110/71 | HR 69

## 2013-11-05 DIAGNOSIS — M5090 Cervical disc disorder, unspecified, unspecified cervical region: Secondary | ICD-10-CM

## 2013-11-05 MED ORDER — IOHEXOL 300 MG/ML  SOLN
10.0000 mL | Freq: Once | INTRAMUSCULAR | Status: AC | PRN
  Administered 2013-11-05: 10 mL via INTRATHECAL

## 2013-11-05 MED ORDER — DIAZEPAM 5 MG PO TABS
10.0000 mg | ORAL_TABLET | Freq: Once | ORAL | Status: AC
  Administered 2013-11-05: 10 mg via ORAL

## 2013-11-05 MED ORDER — HYDROMORPHONE HCL PF 1 MG/ML IJ SOLN
1.0000 mg | Freq: Once | INTRAMUSCULAR | Status: AC
  Administered 2013-11-05: 1 mg via INTRAMUSCULAR

## 2013-11-05 MED ORDER — ONDANSETRON HCL 4 MG/2ML IJ SOLN
4.0000 mg | Freq: Once | INTRAMUSCULAR | Status: AC
  Administered 2013-11-05: 4 mg via INTRAMUSCULAR

## 2013-11-05 NOTE — Progress Notes (Signed)
Patient states she has been off Remeron and Zoloft for the past two days.  Brita Romp, RN

## 2013-11-05 NOTE — Discharge Instructions (Signed)
Myelogram Discharge Instructions  1. Go home and rest quietly for the next 24 hours.  It is important to lie flat for the next 24 hours.  Get up only to go to the restroom.  You may lie in the bed or on a couch on your back, your stomach, your left side or your right side.  You may have one pillow under your head.  You may have pillows between your knees while you are on your side or under your knees while you are on your back.  2. DO NOT drive today.  Recline the seat as far back as it will go, while still wearing your seat belt, on the way home.  3. You may get up to go to the bathroom as needed.  You may sit up for 10 minutes to eat.  You may resume your normal diet and medications unless otherwise indicated.  Drink lots of extra fluids today and tomorrow.  4. The incidence of headache, nausea, or vomiting is about 5% (one in 20 patients).  If you develop a headache, lie flat and drink plenty of fluids until the headache goes away.  Caffeinated beverages may be helpful.  If you develop severe nausea and vomiting or a headache that does not go away with flat bed rest, call [phone].  5. You may resume normal activities after your 24 hours of bed rest is over; however, do not exert yourself strongly or do any heavy lifting tomorrow. If when you get up you have a headache when standing, go back to bed and force fluids for another 24 hours.  6. Call your physician for a follow-up appointment.  The results of your myelogram will be sent directly to your physician by the following day.  7. If you have any questions or if complications develop after you arrive home, please call [phone].  Discharge instructions have been explained to the patient.  The patient, or the person responsible for the patient, fully understands these instructions.      May resume Remeron and Zoloft on Aug. 21, 2015, after 9:30 am.

## 2013-11-09 ENCOUNTER — Other Ambulatory Visit: Payer: Federal, State, Local not specified - PPO

## 2013-11-17 HISTORY — PX: CERVICAL FUSION: SHX112

## 2013-11-17 HISTORY — PX: NECK SURGERY: SHX720

## 2014-01-06 ENCOUNTER — Encounter: Payer: Self-pay | Admitting: Cardiovascular Disease

## 2014-01-06 ENCOUNTER — Ambulatory Visit (INDEPENDENT_AMBULATORY_CARE_PROVIDER_SITE_OTHER): Payer: Federal, State, Local not specified - PPO | Admitting: *Deleted

## 2014-01-06 DIAGNOSIS — I455 Other specified heart block: Secondary | ICD-10-CM

## 2014-01-06 NOTE — Progress Notes (Signed)
Remote pacemaker transmission.   

## 2014-01-08 LAB — MDC_IDC_ENUM_SESS_TYPE_REMOTE
Battery Impedance: 100 Ohm
Battery Remaining Longevity: 161 mo
Battery Voltage: 2.79 V
Brady Statistic AP VP Percent: 0 %
Brady Statistic AP VS Percent: 8 %
Brady Statistic AS VP Percent: 0 %
Brady Statistic AS VS Percent: 92 %
Date Time Interrogation Session: 20151021115640
Lead Channel Impedance Value: 445 Ohm
Lead Channel Impedance Value: 555 Ohm
Lead Channel Pacing Threshold Amplitude: 0.375 V
Lead Channel Pacing Threshold Amplitude: 1.125 V
Lead Channel Pacing Threshold Pulse Width: 0.4 ms
Lead Channel Pacing Threshold Pulse Width: 0.4 ms
Lead Channel Sensing Intrinsic Amplitude: 0.7 mV
Lead Channel Sensing Intrinsic Amplitude: 5.6 mV
Lead Channel Setting Pacing Amplitude: 2 V
Lead Channel Setting Pacing Amplitude: 2.5 V
Lead Channel Setting Pacing Pulse Width: 0.4 ms
Lead Channel Setting Sensing Sensitivity: 2 mV

## 2014-01-13 ENCOUNTER — Other Ambulatory Visit: Payer: Self-pay | Admitting: Neurological Surgery

## 2014-01-13 DIAGNOSIS — M5412 Radiculopathy, cervical region: Secondary | ICD-10-CM

## 2014-01-14 ENCOUNTER — Encounter: Payer: Self-pay | Admitting: Cardiology

## 2014-01-18 ENCOUNTER — Ambulatory Visit
Admission: RE | Admit: 2014-01-18 | Discharge: 2014-01-18 | Disposition: A | Discharge status: Home / Self Care | Payer: Federal, State, Local not specified - PPO | Source: Ambulatory Visit | Attending: Neurological Surgery | Admitting: Neurological Surgery

## 2014-01-18 DIAGNOSIS — M5412 Radiculopathy, cervical region: Secondary | ICD-10-CM

## 2014-01-18 MED ORDER — IOHEXOL 180 MG/ML  SOLN
15.0000 mL | Freq: Once | INTRAMUSCULAR | Status: AC | PRN
  Administered 2014-01-18: 15 mL via INTRATHECAL

## 2014-01-18 MED ORDER — DIAZEPAM 5 MG PO TABS
10.0000 mg | ORAL_TABLET | Freq: Once | ORAL | Status: AC
  Administered 2014-01-18: 10 mg via ORAL

## 2014-01-18 NOTE — Discharge Instructions (Signed)
Myelogram Discharge Instructions  1. Go home and rest quietly for the next 24 hours.  It is important to lie flat for the next 24 hours.  Get up only to go to the restroom.  You may lie in the bed or on a couch on your back, your stomach, your left side or your right side.  You may have one pillow under your head.  You may have pillows between your knees while you are on your side or under your knees while you are on your back.  2. DO NOT drive today.  Recline the seat as far back as it will go, while still wearing your seat belt, on the way home.  3. You may get up to go to the bathroom as needed.  You may sit up for 10 minutes to eat.  You may resume your normal diet and medications unless otherwise indicated.  Drink lots of extra fluids today and tomorrow.  4. The incidence of headache, nausea, or vomiting is about 5% (one in 20 patients).  If you develop a headache, lie flat and drink plenty of fluids until the headache goes away.  Caffeinated beverages may be helpful.  If you develop severe nausea and vomiting or a headache that does not go away with flat bed rest, call [phone].  5. You may resume normal activities after your 24 hours of bed rest is over; however, do not exert yourself strongly or do any heavy lifting tomorrow. If when you get up you have a headache when standing, go back to bed and force fluids for another 24 hours.  6. Call your physician for a follow-up appointment.  The results of your myelogram will be sent directly to your physician by the following day.  7. If you have any questions or if complications develop after you arrive home, please call [phone].  Discharge instructions have been explained to the patient.  The patient, or the person responsible for the patient, fully understands these instructions.      May resume Remeron, Latuda and Zoloft on Nov. 3, 2015, after 9:30 am.

## 2014-01-27 ENCOUNTER — Telehealth: Payer: Self-pay | Admitting: Cardiology

## 2014-01-28 NOTE — Telephone Encounter (Signed)
Close encounter 

## 2014-02-16 ENCOUNTER — Ambulatory Visit (INDEPENDENT_AMBULATORY_CARE_PROVIDER_SITE_OTHER): Payer: Federal, State, Local not specified - PPO | Admitting: Cardiology

## 2014-02-16 ENCOUNTER — Encounter: Payer: Self-pay | Admitting: Cardiology

## 2014-02-16 ENCOUNTER — Ambulatory Visit (INDEPENDENT_AMBULATORY_CARE_PROVIDER_SITE_OTHER): Payer: Federal, State, Local not specified - PPO | Admitting: *Deleted

## 2014-02-16 VITALS — BP 118/83 | HR 96 | Ht 67.0 in | Wt 123.0 lb

## 2014-02-16 DIAGNOSIS — Z9189 Other specified personal risk factors, not elsewhere classified: Secondary | ICD-10-CM

## 2014-02-16 DIAGNOSIS — Z87898 Personal history of other specified conditions: Secondary | ICD-10-CM

## 2014-02-16 DIAGNOSIS — I455 Other specified heart block: Secondary | ICD-10-CM

## 2014-02-16 DIAGNOSIS — R55 Syncope and collapse: Secondary | ICD-10-CM

## 2014-02-16 DIAGNOSIS — E878 Other disorders of electrolyte and fluid balance, not elsewhere classified: Secondary | ICD-10-CM

## 2014-02-16 DIAGNOSIS — Z720 Tobacco use: Secondary | ICD-10-CM

## 2014-02-16 NOTE — Assessment & Plan Note (Addendum)
Leading to dx of sinus pause and Medtronic PPM. No further syncope.

## 2014-02-16 NOTE — Patient Instructions (Signed)
We have blood work for you to get done  Your physician recommends that you schedule a follow-up appointment in: 6 weeks with Dr. Sallyanne Kuster

## 2014-02-16 NOTE — Assessment & Plan Note (Signed)
No further episodes of syncope, + episodes of near syncope when going from lying or sitting to standing.  Discussed with Dr. Jerilynn Mages. Croitoru and pacemaker adjusted rate drop added so that if HR drops with standing the pacer would kick in not just less than 60 BPM but if HR drops >25 beats.  Hopefully this would stop the near syncope.  If not we would add florinef at that point.  We asked her to increase salt intake as well.  She will follow up with Dr. Jerilynn Mages. Croitoru in Jan. And then Dr. Roni Bread in 6 months.  She will call in the meantime if episodes continue.

## 2014-02-16 NOTE — Assessment & Plan Note (Signed)
Discussed if not stopping at least decreasing amount.  She agreed to try.

## 2014-02-16 NOTE — Progress Notes (Signed)
02/16/2014   PCP: Sherian Maroon, MD   Chief Complaint  Patient presents with  . Dizziness    near syncope    Primary Cardiologist:Dr. Roni Bread For PPM:  Dr. Bertrum Sol    HPI:  58 y.o. female with a Cardiovascular Problem List below who presents today for eval after recurrent near syncope episodes.   She had a relatively significant episode of syncope and was found to have prolonged sinus pauses on loop recorder. She has a history in her family with her daughter and stillborn grandchild who had DMP mutation for ARVC. She had medtronic PPM placed 02/2013.  This helped in that she has no further syncope, but she continues with episodes of near syncope.  These occur when she goes from lying or sitting to standing.  She feels she may pass out ie: dizziness, lightheaded and she sits or lies on the floor immediately.  These episodes occur twice a week.  At times she rests and gets up and is fine.  Other times she has recurrent episodes every time she gets up.  She eats well, eats salty food.  She continues to smoke 1 ppd I have asked her to decrease to ahlf a PPD, though she is hesitant to even try this.    She recently had ant disc surgery, with healing scar. She occ has lt arm pain last few min, due to her neck surgery.  Interval History: her last visit with Dr. Sallyanne Kuster was in January. Thankfully she has not had any further syncopal episodes since the pacemaker placement. The only major problem she's had over the last has been vertigo with dizziness. She is seeing ENT physicians as well as her PCP without any true assistance other than meclizine. Otherwise, she really denies any significant palpitations, or near syncope. No TIA or amaurosis fugax symptoms. She has not had any chest tightness or pressure with rest or exertion, but does note getting short of breath she going up a flight of steps or up a hill.. Additional dizziness but nothing significant. No otherwise  TIA or amaurosis fugax symptoms. No melena hematochezia or hematuria or epistaxis. No PND, orthopnea or edema. No claudication.   Allergies  Allergen Reactions  . Penicillins Other (See Comments)    Yeast infection    Current Outpatient Prescriptions  Medication Sig Dispense Refill  . ALPRAZolam (XANAX) 0.5 MG tablet Take 1- 2 tabs daily    . mirtazapine (REMERON) 30 MG tablet Take 30 mg by mouth at bedtime.    Marland Kitchen oxyCODONE-acetaminophen (PERCOCET/ROXICET) 5-325 MG per tablet Take 1-2 tablets by mouth every 4 (four) hours as needed for severe pain. 8 tablet 0  . sertraline (ZOLOFT) 100 MG tablet Take 100 mg by mouth daily.    Marland Kitchen tiZANidine (ZANAFLEX) 4 MG tablet As needed     No current facility-administered medications for this visit.    Past Medical History  Diagnosis Date  . Syncope and collapse     echo-April 12,2012-EF 55% Echo normal; recorder with prolonged sinus pauses --> status post  . Anxiety     anxiety and mild depressive order systoms  . Vertigo   . Tobacco abuse   . Sinus pause 02/14/2013  . Pacemaker   . FMBWGYKZ(993.5)     "monthly" (02/20/2013)  . Bipolar disorder      (02/20/2013)    Past Surgical History  Procedure Laterality Date  . Transthoracic echocardiogram  07/11/2010    The  left atrial size is normal.There is no evidence of mitral vavle prolaspe.Right ventriicular systolic pressure is normal . Injection of contrast documented no interatrial shunt. Essentially normal 2D echo -doppler study   . Abdominal hysterectomy  03/19/1980    "partial"  . Insert / replace / remove pacemaker  02/20/2013    medtronic  . Posterior lumbar fusion  ~ 2009  . Back surgery    . Cervical fusion Left 11/2013    C4-C6    LPF:XTKWIOX:BD colds or fevers, no weight changes, she has good appetitive,drinks fluid during the day, only 2 caffeineated beverages a day.    Skin:no rashes or ulcers HEENT:no blurred vision, no congestion CV:see HPI PUL:see HPI GI:no diarrhea  constipation or melena, no indigestion GU:no hematuria, no dysuria MS:no joint pain, no claudication Neuro:+ near syncope, + lightheadedness Endo:no diabetes, no thyroid disease  Wt Readings from Last 3 Encounters:  02/16/14 123 lb (55.792 kg)  08/19/13 122 lb (55.339 kg)  03/30/13 122 lb (55.339 kg)    PHYSICAL EXAM BP 118/83 mmHg  Pulse 96  Ht 5\' 7"  (1.702 m)  Wt 123 lb (55.792 kg)  BMI 19.26 kg/m2 General:Pleasant affect, NAD Skin:Warm and dry, brisk capillary refill HEENT:normocephalic, sclera clear, mucus membranes moist Neck:supple, no JVD, no bruits  Heart:S1S2 RRR without murmur, gallup, rub or click Lungs:clear without rales, rhonchi, or wheezes ZHG:DJME, non tender, + BS, do not palpate liver spleen or masses Ext:no lower ext edema, 2+ pedal pulses, 2+ radial pulses Neuro:alert and oriented X 3, MAE, follows commands, + facial symmetry  EKG:SR normal EKG  Pacemaker interrogated.  No SVT, No rapid HR.  Has 14 years longevity.   ASSESSMENT AND PLAN Near syncope No further episodes of syncope, + episodes of near syncope when going from lying or sitting to standing.  Discussed with Dr. Jerilynn Mages. Croitoru and pacemaker adjusted rate drop added so that if HR drops with standing the pacer would kick in not just less than 60 BPM but if HR drops >25 beats.  Hopefully this would stop the near syncope.  If not we would add florinef at that point.  We asked her to increase salt intake as well.  She will follow up with Dr. Jerilynn Mages. Croitoru in Jan. And then Dr. Roni Bread in 6 months.  She will call in the meantime if episodes continue.  Hx of syncope- s/p Loop recorder 11/06/12 Leading to dx of sinus pause and Medtronic PPM. No further syncope.  Tobacco abuse Discussed if not stopping at least decreasing amount.  She agreed to try.

## 2014-02-17 ENCOUNTER — Other Ambulatory Visit: Payer: Self-pay | Admitting: Orthopedic Surgery

## 2014-02-17 DIAGNOSIS — M79605 Pain in left leg: Secondary | ICD-10-CM

## 2014-02-17 DIAGNOSIS — M79604 Pain in right leg: Secondary | ICD-10-CM

## 2014-02-17 LAB — BASIC METABOLIC PANEL
BUN: 6 mg/dL (ref 6–23)
CO2: 26 mEq/L (ref 19–32)
Calcium: 9.4 mg/dL (ref 8.4–10.5)
Chloride: 107 mEq/L (ref 96–112)
Creat: 0.67 mg/dL (ref 0.50–1.10)
Glucose, Bld: 93 mg/dL (ref 70–99)
Potassium: 4.1 mEq/L (ref 3.5–5.3)
Sodium: 139 mEq/L (ref 135–145)

## 2014-02-17 LAB — MAGNESIUM: Magnesium: 2.1 mg/dL (ref 1.5–2.5)

## 2014-02-18 LAB — MDC_IDC_ENUM_SESS_TYPE_INCLINIC
Battery Impedance: 111 Ohm
Battery Remaining Longevity: 166 mo
Battery Voltage: 2.79 V
Brady Statistic AP VP Percent: 0 %
Brady Statistic AP VS Percent: 9 %
Brady Statistic AS VP Percent: 0 %
Brady Statistic AS VS Percent: 91 %
Date Time Interrogation Session: 20151201170201
Lead Channel Impedance Value: 431 Ohm
Lead Channel Impedance Value: 464 Ohm
Lead Channel Pacing Threshold Amplitude: 0.75 V
Lead Channel Pacing Threshold Amplitude: 1.25 V
Lead Channel Pacing Threshold Pulse Width: 0.4 ms
Lead Channel Pacing Threshold Pulse Width: 0.4 ms
Lead Channel Sensing Intrinsic Amplitude: 2 mV
Lead Channel Sensing Intrinsic Amplitude: 4 mV
Lead Channel Setting Pacing Amplitude: 2 V
Lead Channel Setting Pacing Amplitude: 2.5 V
Lead Channel Setting Pacing Pulse Width: 0.4 ms
Lead Channel Setting Sensing Sensitivity: 2 mV

## 2014-02-18 NOTE — Progress Notes (Signed)
Pacemaker check in clinic w/[name]. Normal device function. Thresholds, sensing, impedances consistent with previous measurements. Device programmed to maximize longevity. 53 mode switches (<0.1%)---max. dur. 24 mins 53 sec (5/25), Max A >400 (6/25), Max V 179. 15 high ventricular rates noted---max dur. 21 sec, Max V >400--most from 9/23---neck surgery. Device programmed at appropriate safety margins. Histogram distribution appropriate for patient activity level. Device programmed to optimize intrinsic conduction. Mode changed from MVP(R) to DDD, mode switch D/C'd, base rate decreased from 60 to 50bpm, Auto PVARP D/C'd---permanently programmed to 375ms---RDR enabled (Drop) at nominal settings due to frequent presyncope episodes. Estimated longevity 14 years. Patient will follow up with [facility] in 6 weeks.

## 2014-02-24 ENCOUNTER — Ambulatory Visit
Admission: RE | Admit: 2014-02-24 | Discharge: 2014-02-24 | Disposition: A | Discharge status: Home / Self Care | Payer: Federal, State, Local not specified - PPO | Source: Ambulatory Visit | Attending: Orthopedic Surgery | Admitting: Orthopedic Surgery

## 2014-02-24 DIAGNOSIS — M79605 Pain in left leg: Secondary | ICD-10-CM

## 2014-02-24 DIAGNOSIS — M79604 Pain in right leg: Secondary | ICD-10-CM

## 2014-02-24 MED ORDER — DIAZEPAM 5 MG PO TABS
10.0000 mg | ORAL_TABLET | Freq: Once | ORAL | Status: AC
  Administered 2014-02-24: 10 mg via ORAL

## 2014-02-24 MED ORDER — MEPERIDINE HCL 100 MG/ML IJ SOLN
100.0000 mg | Freq: Once | INTRAMUSCULAR | Status: AC
  Administered 2014-02-24: 100 mg via INTRAMUSCULAR

## 2014-02-24 MED ORDER — IOHEXOL 180 MG/ML  SOLN
15.0000 mL | Freq: Once | INTRAMUSCULAR | Status: AC | PRN
  Administered 2014-02-24: 15 mL via INTRATHECAL

## 2014-02-24 MED ORDER — ONDANSETRON HCL 4 MG/2ML IJ SOLN
4.0000 mg | Freq: Once | INTRAMUSCULAR | Status: AC
  Administered 2014-02-24: 4 mg via INTRAMUSCULAR

## 2014-02-24 MED ORDER — ONDANSETRON HCL 4 MG/2ML IJ SOLN
4.0000 mg | Freq: Four times a day (QID) | INTRAMUSCULAR | Status: DC | PRN
Start: 2014-02-24 — End: 2014-02-25

## 2014-02-24 NOTE — Discharge Instructions (Signed)
Myelogram Discharge Instructions  1. Go home and rest quietly for the next 24 hours.  It is important to lie flat for the next 24 hours.  Get up only to go to the restroom.  You may lie in the bed or on a couch on your back, your stomach, your left side or your right side.  You may have one pillow under your head.  You may have pillows between your knees while you are on your side or under your knees while you are on your back.  2. DO NOT drive today.  Recline the seat as far back as it will go, while still wearing your seat belt, on the way home.  3. You may get up to go to the bathroom as needed.  You may sit up for 10 minutes to eat.  You may resume your normal diet and medications unless otherwise indicated.  Drink lots of extra fluids today and tomorrow.  4. The incidence of headache, nausea, or vomiting is about 5% (one in 20 patients).  If you develop a headache, lie flat and drink plenty of fluids until the headache goes away.  Caffeinated beverages may be helpful.  If you develop severe nausea and vomiting or a headache that does not go away with flat bed rest, call [phone].  5. You may resume normal activities after your 24 hours of bed rest is over; however, do not exert yourself strongly or do any heavy lifting tomorrow. If when you get up you have a headache when standing, go back to bed and force fluids for another 24 hours.  6. Call your physician for a follow-up appointment.  The results of your myelogram will be sent directly to your physician by the following day.  7. If you have any questions or if complications develop after you arrive home, please call [phone].  Discharge instructions have been explained to the patient.  The patient, or the person responsible for the patient, fully understands these instructions.    May resume Zoloft and Remeron on Dec. 10, 2015, after 9:30 am.

## 2014-02-25 ENCOUNTER — Encounter (HOSPITAL_COMMUNITY): Payer: Self-pay | Admitting: Cardiovascular Disease

## 2014-02-26 NOTE — Addendum Note (Signed)
Addended by: Vear Clock on: 02/26/2014 01:23 PM   Modules accepted: Orders

## 2014-03-01 ENCOUNTER — Other Ambulatory Visit: Payer: Self-pay | Admitting: Neurological Surgery

## 2014-03-15 NOTE — Pre-Procedure Instructions (Signed)
[name]  03/15/2014   Your procedure is scheduled on:  Friday, January 8.  Report to [facility] Admitting at 9:15 AM.  Call this number if you have problems the morning of surgery: [phone]               For any other questions, please call [phone], Monday - Friday 8 AM - 4 PM.  Remember:   Do not eat food or drink liquids after midnight Thursday, January 7.  Take these medicines the morning of surgery with A SIP OF WATER: sertraline (ZOLOFT), ALPRAZolam (XANAX).              May take oxyCODONE-acetaminophen (PERCOCET/ROXICET).   Do not wear jewelry, make-up or nail polish.  Do not wear lotions, powders, or perfumes.   Do not shave 48 hours prior to surgery.  Do not bring valuables to the hospital.              [facility] is not responsible for any belongings or valuables.               Contacts, dentures or bridgework may not be worn into surgery.  Leave suitcase in the car. After surgery it may be brought to your room.  For patients admitted to the hospital, discharge time is determined by your  treatment team.               Patients discharged the day of surgery will not be allowed to drive home.  Name and phone number of your driver: -   Special Instructions: Review  [facility] - Preparing For Surgery.   Please read over the following fact sheets that you were given: Pain Booklet, Coughing and Deep Breathing, Blood Transfusion Information and Surgical Site Infection Prevention

## 2014-03-16 ENCOUNTER — Inpatient Hospital Stay (HOSPITAL_COMMUNITY)
Admission: RE | Admit: 2014-03-16 | Discharge: 2014-03-16 | Disposition: A | Discharge status: Home / Self Care | Payer: Federal, State, Local not specified - PPO | Source: Ambulatory Visit

## 2014-03-19 HISTORY — PX: SPINAL FUSION: SHX223

## 2014-03-24 ENCOUNTER — Ambulatory Visit
Admission: RE | Admit: 2014-03-24 | Discharge: 2014-03-24 | Disposition: A | Discharge status: Home / Self Care | Payer: Federal, State, Local not specified - PPO | Source: Ambulatory Visit | Attending: Family Medicine | Admitting: Family Medicine

## 2014-03-24 ENCOUNTER — Other Ambulatory Visit: Payer: Self-pay | Admitting: Family Medicine

## 2014-03-24 DIAGNOSIS — Z01818 Encounter for other preprocedural examination: Secondary | ICD-10-CM

## 2014-03-25 ENCOUNTER — Encounter: Payer: Self-pay | Admitting: Cardiovascular Disease

## 2014-03-26 ENCOUNTER — Inpatient Hospital Stay (HOSPITAL_COMMUNITY)
Admission: RE | Admit: 2014-03-26 | Payer: Federal, State, Local not specified - PPO | Source: Ambulatory Visit | Admitting: Neurological Surgery

## 2014-03-26 ENCOUNTER — Encounter (HOSPITAL_COMMUNITY): Admission: RE | Payer: Federal, State, Local not specified - PPO | Source: Ambulatory Visit

## 2014-03-26 SURGERY — POSTERIOR LUMBAR FUSION 1 LEVEL
Anesthesia: General | Site: Back | Laterality: Left

## 2014-04-08 ENCOUNTER — Telehealth: Payer: Self-pay | Admitting: *Deleted

## 2014-04-08 NOTE — Telephone Encounter (Signed)
Faxed clearance for spine revision L4-5 fusion/iliac crest bone graft,removal and implantation of pedicle screw... Cleared, no recommendation of changes per Dr Ellyn Hack.

## 2014-04-09 ENCOUNTER — Encounter (HOSPITAL_COMMUNITY)
Admission: RE | Admit: 2014-04-09 | Discharge: 2014-04-09 | Disposition: A | Discharge status: Home / Self Care | Payer: Federal, State, Local not specified - PPO | Source: Ambulatory Visit | Attending: Orthopedic Surgery | Admitting: Orthopedic Surgery

## 2014-04-09 ENCOUNTER — Encounter (HOSPITAL_COMMUNITY): Payer: Self-pay

## 2014-04-09 HISTORY — DX: Cardiac arrhythmia, unspecified: I49.9

## 2014-04-09 HISTORY — DX: Other specified postprocedural states: Z98.890

## 2014-04-09 HISTORY — DX: Other specified postprocedural states: R11.2

## 2014-04-09 LAB — CBC
HCT: 42.3 % (ref 36.0–46.0)
Hemoglobin: 14 g/dL (ref 12.0–15.0)
MCH: 29.2 pg (ref 26.0–34.0)
MCHC: 33.1 g/dL (ref 30.0–36.0)
MCV: 88.1 fL (ref 78.0–100.0)
Platelets: 220 10*3/uL (ref 150–400)
RBC: 4.8 MIL/uL (ref 3.87–5.11)
RDW: 14.3 % (ref 11.5–15.5)
WBC: 7.5 10*3/uL (ref 4.0–10.5)

## 2014-04-09 LAB — BASIC METABOLIC PANEL
Anion gap: 5 (ref 5–15)
BUN: 9 mg/dL (ref 6–23)
CO2: 27 mmol/L (ref 19–32)
Calcium: 9.5 mg/dL (ref 8.4–10.5)
Chloride: 108 mmol/L (ref 96–112)
Creatinine, Ser: 0.91 mg/dL (ref 0.50–1.10)
GFR calc Af Amer: 80 mL/min — ABNORMAL LOW (ref >90)
GFR calc non Af Amer: 69 mL/min — ABNORMAL LOW (ref >90)
Glucose, Bld: 103 mg/dL — ABNORMAL HIGH (ref 70–99)
Potassium: 3.8 mmol/L (ref 3.5–5.1)
Sodium: 140 mmol/L (ref 135–145)

## 2014-04-09 LAB — TYPE AND SCREEN
ABO/RH(D): A POS
Antibody Screen: NEGATIVE

## 2014-04-09 LAB — SURGICAL PCR SCREEN
MRSA, PCR: NEGATIVE
Staphylococcus aureus: NEGATIVE

## 2014-04-09 NOTE — Pre-Procedure Instructions (Signed)
[name]  04/09/2014   Your procedure is scheduled on:  04/15/14  Report to [facility] Admitting at 530 AM.  Call this number if you have problems the morning of surgery: [phone]   Remember:   Do not eat food or drink liquids after midnight.   Take these medicines the morning of surgery with A SIP OF WATER: xanax,prilosec,oxycodone,zoloft   Do not wear jewelry, make-up or nail polish.  Do not wear lotions, powders, or perfumes. You may wear deodorant.  Do not shave 48 hours prior to surgery. Men may shave face and neck.  Do not bring valuables to the hospital.  [facility] is not responsible                  for any belongings or valuables.               Contacts, dentures or bridgework may not be worn into surgery.  Leave suitcase in the car. After surgery it may be brought to your room.  For patients admitted to the hospital, discharge time is determined by your                treatment team.               Patients discharged the day of surgery will not be allowed to drive  home.  Name and phone number of your driver: family  Special Instructions: Shower using CHG 2 nights before surgery and the night before surgery.  If you shower the day of surgery use CHG.  Use special wash - you have one bottle of CHG for all showers.  You should use approximately 1/3 of the bottle for each shower.   Please read over the following fact sheets that you were given: Pain Booklet, Coughing and Deep Breathing, Blood Transfusion Information, MRSA Information and Surgical Site Infection Prevention

## 2014-04-09 NOTE — Progress Notes (Signed)
Pacemaker form faxed to [place]

## 2014-04-12 NOTE — Progress Notes (Signed)
Anesthesia chart review:  Patient is a 59 year old female scheduled for revision of L4-5 fusion, iliac crest bone graft harvest on 04/15/14 by Dr. Rolena Infante.   History includes smoking, syncope related to prolonged sinus pause s/p [employer] dual chamber PPM 02/20/2013 (patient is adopted but has a family history--daughter and stillborn grandaughter--with DMP mutation for arrhythmogenic right ventricular cardiomyopathy [ARVC]), Bipolar disorder, anxiety, vertigo, cervical and lumbar fusion. PCP is listed as Dr. Barney Drain.  Primary cardiologist is Dr. Ellyn Hack.  EP cardiologist is Dr. Sallyanne Kuster. Telephone notes from Trixie Dredge, RN indicate that Dr. Ellyn Hack cleared patient for this procedure.  02/16/14 EKG: NSR, possible LAE.  07/11/10 Echo: Normal Lv size and wall thickness. Normal LV systolic function, EF > 15%. Normal LV wall motion. RV is normal in size and function. No interatrial shunt. Trace MR/TR/PR. RVSP is normal.    04/09/14 CXR: No acute cardiopulmonary disease. Cardiac pacer noted with lead tips in the right atrium and right ventricle.  Preoperative labs noted.   PPM perioperative device form is still pending. Last interrogation 02/18/14.  Chart will be left for nurse follow-up.  [name]/Anesthesiology Phone [phone] 04/12/2014 9:54 AM

## 2014-04-14 MED ORDER — VANCOMYCIN HCL IN DEXTROSE 1-5 GM/200ML-% IV SOLN
1000.0000 mg | INTRAVENOUS | Status: AC
  Administered 2014-04-15: 1000 mg via INTRAVENOUS
  Filled 2014-04-14: qty 200

## 2014-04-15 ENCOUNTER — Encounter (HOSPITAL_COMMUNITY): Payer: Self-pay | Admitting: *Deleted

## 2014-04-15 ENCOUNTER — Encounter (HOSPITAL_COMMUNITY)
Admission: RE | Disposition: A | Discharge status: Home / Self Care | Payer: Federal, State, Local not specified - PPO | Source: Ambulatory Visit | Attending: Orthopedic Surgery

## 2014-04-15 ENCOUNTER — Ambulatory Visit (HOSPITAL_COMMUNITY): Payer: Federal, State, Local not specified - PPO

## 2014-04-15 ENCOUNTER — Ambulatory Visit (HOSPITAL_COMMUNITY): Payer: Federal, State, Local not specified - PPO | Admitting: Vascular Surgery

## 2014-04-15 ENCOUNTER — Telehealth: Payer: Self-pay | Admitting: *Deleted

## 2014-04-15 ENCOUNTER — Inpatient Hospital Stay (HOSPITAL_COMMUNITY)
Admission: RE | Admit: 2014-04-15 | Discharge: 2014-04-16 | DRG: 460 | Disposition: A | Discharge status: Home / Self Care | Payer: Federal, State, Local not specified - PPO | Source: Ambulatory Visit | Attending: Orthopedic Surgery | Admitting: Orthopedic Surgery

## 2014-04-15 ENCOUNTER — Ambulatory Visit (HOSPITAL_COMMUNITY): Payer: Federal, State, Local not specified - PPO | Admitting: Anesthesiology

## 2014-04-15 ENCOUNTER — Encounter: Payer: Federal, State, Local not specified - PPO | Admitting: Cardiovascular Disease

## 2014-04-15 DIAGNOSIS — Z79899 Other long term (current) drug therapy: Secondary | ICD-10-CM | POA: Diagnosis not present

## 2014-04-15 DIAGNOSIS — T84226A Displacement of internal fixation device of vertebrae, initial encounter: Secondary | ICD-10-CM | POA: Diagnosis present

## 2014-04-15 DIAGNOSIS — Y831 Surgical operation with implant of artificial internal device as the cause of abnormal reaction of the patient, or of later complication, without mention of misadventure at the time of the procedure: Secondary | ICD-10-CM | POA: Diagnosis present

## 2014-04-15 DIAGNOSIS — F17210 Nicotine dependence, cigarettes, uncomplicated: Secondary | ICD-10-CM | POA: Diagnosis present

## 2014-04-15 DIAGNOSIS — Z419 Encounter for procedure for purposes other than remedying health state, unspecified: Secondary | ICD-10-CM

## 2014-04-15 DIAGNOSIS — M549 Dorsalgia, unspecified: Secondary | ICD-10-CM | POA: Diagnosis present

## 2014-04-15 DIAGNOSIS — Z01812 Encounter for preprocedural laboratory examination: Secondary | ICD-10-CM

## 2014-04-15 DIAGNOSIS — F419 Anxiety disorder, unspecified: Secondary | ICD-10-CM | POA: Diagnosis present

## 2014-04-15 DIAGNOSIS — M960 Pseudarthrosis after fusion or arthrodesis: Secondary | ICD-10-CM | POA: Diagnosis present

## 2014-04-15 DIAGNOSIS — F319 Bipolar disorder, unspecified: Secondary | ICD-10-CM | POA: Diagnosis present

## 2014-04-15 DIAGNOSIS — S32009K Unspecified fracture of unspecified lumbar vertebra, subsequent encounter for fracture with nonunion: Secondary | ICD-10-CM | POA: Diagnosis present

## 2014-04-15 DIAGNOSIS — Z981 Arthrodesis status: Secondary | ICD-10-CM

## 2014-04-15 DIAGNOSIS — Z950 Presence of cardiac pacemaker: Secondary | ICD-10-CM

## 2014-04-15 SURGERY — POSTERIOR LUMBAR FUSION 1 WITH HARDWARE REMOVAL
Anesthesia: General | Site: Spine Lumbar

## 2014-04-15 MED ORDER — ACETAMINOPHEN 10 MG/ML IV SOLN
1000.0000 mg | Freq: Four times a day (QID) | INTRAVENOUS | Status: AC
  Administered 2014-04-15 – 2014-04-16 (×4): 1000 mg via INTRAVENOUS
  Filled 2014-04-15 (×6): qty 100

## 2014-04-15 MED ORDER — HYDROMORPHONE HCL 1 MG/ML IJ SOLN
INTRAMUSCULAR | Status: AC
  Filled 2014-04-15: qty 1

## 2014-04-15 MED ORDER — ARTIFICIAL TEARS OP OINT
TOPICAL_OINTMENT | OPHTHALMIC | Status: DC | PRN
  Administered 2014-04-15: 1 via OPHTHALMIC

## 2014-04-15 MED ORDER — FENTANYL CITRATE 0.05 MG/ML IJ SOLN
INTRAMUSCULAR | Status: DC | PRN
  Administered 2014-04-15 (×6): 50 ug via INTRAVENOUS

## 2014-04-15 MED ORDER — MORPHINE SULFATE 2 MG/ML IJ SOLN
INTRAMUSCULAR | Status: AC
  Filled 2014-04-15: qty 1

## 2014-04-15 MED ORDER — SODIUM CHLORIDE 0.9 % IJ SOLN: 3.0000 mL | Freq: Two times a day (BID) | INTRAMUSCULAR | Status: DC

## 2014-04-15 MED ORDER — MORPHINE SULFATE 2 MG/ML IJ SOLN
1.0000 mg | INTRAMUSCULAR | Status: DC | PRN
  Administered 2014-04-15 – 2014-04-16 (×3): 2 mg via INTRAVENOUS
  Filled 2014-04-15 (×2): qty 1

## 2014-04-15 MED ORDER — LACTATED RINGERS IV SOLN
INTRAVENOUS | Status: DC | PRN
  Administered 2014-04-15 (×2): via INTRAVENOUS

## 2014-04-15 MED ORDER — MENTHOL 3 MG MT LOZG: 1.0000 | LOZENGE | OROMUCOSAL | Status: DC | PRN

## 2014-04-15 MED ORDER — PHENYLEPHRINE HCL 10 MG/ML IJ SOLN
INTRAMUSCULAR | Status: DC | PRN
  Administered 2014-04-15 (×2): 80 ug via INTRAVENOUS

## 2014-04-15 MED ORDER — DEXTROSE 5 % IV SOLN
10.0000 mg | INTRAVENOUS | Status: DC | PRN
  Administered 2014-04-15: 15 ug/min via INTRAVENOUS

## 2014-04-15 MED ORDER — SERTRALINE HCL 100 MG PO TABS
100.0000 mg | ORAL_TABLET | Freq: Every day | ORAL | Status: DC
  Administered 2014-04-15 – 2014-04-16 (×2): 100 mg via ORAL
  Filled 2014-04-15 (×2): qty 1

## 2014-04-15 MED ORDER — ACETAMINOPHEN 10 MG/ML IV SOLN: 1000.0000 mg | Freq: Four times a day (QID) | INTRAVENOUS | Status: DC

## 2014-04-15 MED ORDER — ONDANSETRON HCL 4 MG/2ML IJ SOLN: 4.0000 mg | INTRAMUSCULAR | Status: DC | PRN

## 2014-04-15 MED ORDER — MIDAZOLAM HCL 2 MG/2ML IJ SOLN
INTRAMUSCULAR | Status: AC
  Filled 2014-04-15: qty 2

## 2014-04-15 MED ORDER — ALPRAZOLAM 0.5 MG PO TABS
1.0000 mg | ORAL_TABLET | Freq: Every day | ORAL | Status: DC
  Administered 2014-04-15: 1 mg via ORAL
  Filled 2014-04-15: qty 2

## 2014-04-15 MED ORDER — METHOCARBAMOL 1000 MG/10ML IJ SOLN
500.0000 mg | Freq: Four times a day (QID) | INTRAVENOUS | Status: DC | PRN
  Filled 2014-04-15: qty 5

## 2014-04-15 MED ORDER — SODIUM CHLORIDE 0.9 % IJ SOLN: 3.0000 mL | INTRAMUSCULAR | Status: DC | PRN

## 2014-04-15 MED ORDER — BUPIVACAINE-EPINEPHRINE 0.25% -1:200000 IJ SOLN
INTRAMUSCULAR | Status: DC | PRN
  Administered 2014-04-15: 10 mL

## 2014-04-15 MED ORDER — PROPOFOL 10 MG/ML IV BOLUS
INTRAVENOUS | Status: AC
  Filled 2014-04-15: qty 20

## 2014-04-15 MED ORDER — ONDANSETRON HCL 4 MG/2ML IJ SOLN
INTRAMUSCULAR | Status: AC
  Filled 2014-04-15: qty 2

## 2014-04-15 MED ORDER — FENTANYL CITRATE 0.05 MG/ML IJ SOLN
INTRAMUSCULAR | Status: AC
  Filled 2014-04-15: qty 5

## 2014-04-15 MED ORDER — SODIUM CHLORIDE 0.9 % IV SOLN
250.0000 mL | INTRAVENOUS | Status: DC
  Administered 2014-04-15: 250 mL via INTRAVENOUS

## 2014-04-15 MED ORDER — OXYCODONE-ACETAMINOPHEN 10-325 MG PO TABS: 1.0000 | ORAL_TABLET | ORAL | Status: DC | PRN

## 2014-04-15 MED ORDER — SUCCINYLCHOLINE CHLORIDE 20 MG/ML IJ SOLN
INTRAMUSCULAR | Status: DC | PRN
  Administered 2014-04-15: 100 mg via INTRAVENOUS

## 2014-04-15 MED ORDER — HYDROMORPHONE HCL 1 MG/ML IJ SOLN
0.2500 mg | INTRAMUSCULAR | Status: DC | PRN
  Administered 2014-04-15 (×2): 0.5 mg via INTRAVENOUS

## 2014-04-15 MED ORDER — LACTATED RINGERS IV SOLN
INTRAVENOUS | Status: DC
  Administered 2014-04-15: 85 mL/h via INTRAVENOUS

## 2014-04-15 MED ORDER — PROPOFOL 10 MG/ML IV BOLUS
INTRAVENOUS | Status: DC | PRN
  Administered 2014-04-15: 160 mg via INTRAVENOUS

## 2014-04-15 MED ORDER — DOCUSATE SODIUM 100 MG PO CAPS: 100.0000 mg | ORAL_CAPSULE | Freq: Three times a day (TID) | ORAL | Status: DC | PRN

## 2014-04-15 MED ORDER — EPHEDRINE SULFATE 50 MG/ML IJ SOLN
INTRAMUSCULAR | Status: DC | PRN
  Administered 2014-04-15: 5 mg via INTRAVENOUS
  Administered 2014-04-15: 10 mg via INTRAVENOUS

## 2014-04-15 MED ORDER — MAGNESIUM CITRATE PO SOLN: 1.0000 | Freq: Once | ORAL | Status: AC | PRN

## 2014-04-15 MED ORDER — PHENOL 1.4 % MT LIQD: 1.0000 | OROMUCOSAL | Status: DC | PRN

## 2014-04-15 MED ORDER — LIDOCAINE HCL (CARDIAC) 20 MG/ML IV SOLN
INTRAVENOUS | Status: AC
Start: 2014-04-15 — End: 2014-04-15
  Filled 2014-04-15: qty 5

## 2014-04-15 MED ORDER — PROMETHAZINE HCL 25 MG/ML IJ SOLN: 6.2500 mg | INTRAMUSCULAR | Status: DC | PRN

## 2014-04-15 MED ORDER — OXYCODONE HCL 5 MG PO TABS
10.0000 mg | ORAL_TABLET | ORAL | Status: DC | PRN
  Administered 2014-04-15 – 2014-04-16 (×4): 10 mg via ORAL
  Filled 2014-04-15 (×4): qty 2

## 2014-04-15 MED ORDER — VANCOMYCIN HCL 500 MG IV SOLR
500.0000 mg | Freq: Once | INTRAVENOUS | Status: DC
  Filled 2014-04-15: qty 500

## 2014-04-15 MED ORDER — LISDEXAMFETAMINE DIMESYLATE 30 MG PO CAPS
30.0000 mg | ORAL_CAPSULE | Freq: Every day | ORAL | Status: DC
  Administered 2014-04-16: 30 mg via ORAL
  Filled 2014-04-15: qty 1

## 2014-04-15 MED ORDER — MIRTAZAPINE 30 MG PO TABS
30.0000 mg | ORAL_TABLET | Freq: Every day | ORAL | Status: DC
  Administered 2014-04-15: 30 mg via ORAL
  Filled 2014-04-15 (×2): qty 1

## 2014-04-15 MED ORDER — LIDOCAINE HCL (CARDIAC) 20 MG/ML IV SOLN
INTRAVENOUS | Status: DC | PRN
  Administered 2014-04-15: 60 mg via INTRAVENOUS

## 2014-04-15 MED ORDER — MIDAZOLAM HCL 5 MG/5ML IJ SOLN
INTRAMUSCULAR | Status: DC | PRN
  Administered 2014-04-15 (×2): 1 mg via INTRAVENOUS

## 2014-04-15 MED ORDER — LIDOCAINE HCL 4 % MT SOLN
OROMUCOSAL | Status: DC | PRN
  Administered 2014-04-15: 4 mL via TOPICAL

## 2014-04-15 MED ORDER — METHOCARBAMOL 500 MG PO TABS
500.0000 mg | ORAL_TABLET | Freq: Four times a day (QID) | ORAL | Status: DC | PRN
Start: 2014-04-15 — End: 2014-04-16
  Administered 2014-04-15 – 2014-04-16 (×2): 500 mg via ORAL
  Filled 2014-04-15 (×2): qty 1

## 2014-04-15 MED ORDER — ONDANSETRON HCL 4 MG/2ML IJ SOLN
INTRAMUSCULAR | Status: DC | PRN
  Administered 2014-04-15: 4 mg via INTRAVENOUS

## 2014-04-15 MED ORDER — ROCURONIUM BROMIDE 50 MG/5ML IV SOLN
INTRAVENOUS | Status: AC
  Filled 2014-04-15: qty 1

## 2014-04-15 MED ORDER — THROMBIN 20000 UNITS EX KIT
PACK | CUTANEOUS | Status: DC | PRN
  Administered 2014-04-15: 20000 [IU] via TOPICAL

## 2014-04-15 MED ORDER — BUPIVACAINE-EPINEPHRINE (PF) 0.25% -1:200000 IJ SOLN
INTRAMUSCULAR | Status: AC
  Filled 2014-04-15: qty 30

## 2014-04-15 MED ORDER — THROMBIN 20000 UNITS EX SOLR
CUTANEOUS | Status: AC
  Filled 2014-04-15: qty 20000

## 2014-04-15 MED ORDER — ONDANSETRON HCL 4 MG PO TABS: 4.0000 mg | ORAL_TABLET | Freq: Three times a day (TID) | ORAL | Status: DC | PRN

## 2014-04-15 MED ORDER — METHOCARBAMOL 500 MG PO TABS: 500.0000 mg | ORAL_TABLET | Freq: Three times a day (TID) | ORAL | Status: DC | PRN

## 2014-04-15 SURGICAL SUPPLY — 68 items
BLADE SURG ROTATE 9660 (MISCELLANEOUS) IMPLANT
BUR EGG ELITE 4.0 (BURR) IMPLANT
CLIP NEUROVISION LG (CLIP) ×2 IMPLANT
CLSR STERI-STRIP ANTIMIC 1/2X4 (GAUZE/BANDAGES/DRESSINGS) ×2 IMPLANT
COVER MAYO STAND STRL (DRAPES) ×4 IMPLANT
COVER SURGICAL LIGHT HANDLE (MISCELLANEOUS) ×2 IMPLANT
DRAPE C-ARM 42X72 X-RAY (DRAPES) ×4 IMPLANT
DRAPE C-ARMOR (DRAPES) ×2 IMPLANT
DRAPE POUCH INSTRU U-SHP 10X18 (DRAPES) ×2 IMPLANT
DRAPE SURG 17X23 STRL (DRAPES) ×2 IMPLANT
DRAPE U-SHAPE 47X51 STRL (DRAPES) ×2 IMPLANT
DRSG MEPILEX BORDER 4X8 (GAUZE/BANDAGES/DRESSINGS) ×2 IMPLANT
DURAPREP 26ML APPLICATOR (WOUND CARE) ×2 IMPLANT
ELECT BLADE 4.0 EZ CLEAN MEGAD (MISCELLANEOUS)
ELECT BLADE 6.5 EXT (BLADE) ×2 IMPLANT
ELECT CAUTERY BLADE 6.4 (BLADE) ×2 IMPLANT
ELECT PENCIL ROCKER SW 15FT (MISCELLANEOUS) ×2 IMPLANT
ELECT REM PT RETURN 9FT ADLT (ELECTROSURGICAL) ×2
ELECTRODE BLDE 4.0 EZ CLN MEGD (MISCELLANEOUS) IMPLANT
ELECTRODE REM PT RTRN 9FT ADLT (ELECTROSURGICAL) ×1 IMPLANT
GLOVE BIOGEL PI IND STRL 8.5 (GLOVE) ×1 IMPLANT
GLOVE BIOGEL PI INDICATOR 8.5 (GLOVE) ×1
GLOVE ECLIPSE 8.5 STRL (GLOVE) ×6 IMPLANT
GOWN STRL REUS W/ TWL LRG LVL3 (GOWN DISPOSABLE) ×1 IMPLANT
GOWN STRL REUS W/TWL 2XL LVL3 (GOWN DISPOSABLE) ×4 IMPLANT
GOWN STRL REUS W/TWL LRG LVL3 (GOWN DISPOSABLE) ×1
GUIDEWIRE NITINOL BEVEL TIP (WIRE) ×8 IMPLANT
HARVESTER BONE 10MM SHORT (INSTRUMENTS) ×2 IMPLANT
KIT BASIN OR (CUSTOM PROCEDURE TRAY) ×2 IMPLANT
KIT NEEDLE NVM5 EMG ELECT (KITS) ×1 IMPLANT
KIT NEEDLE NVM5 EMG ELECTRODE (KITS) ×1
KIT POSITION SURG JACKSON T1 (MISCELLANEOUS) ×2 IMPLANT
KIT ROOM TURNOVER OR (KITS) ×2 IMPLANT
MIX DBX 10CC 35% BONE (Bone Implant) ×2 IMPLANT
NEEDLE 22X1 1/2 (OR ONLY) (NEEDLE) ×2 IMPLANT
NEEDLE I-PASS III (NEEDLE) ×2 IMPLANT
NEEDLE SPNL 18GX3.5 QUINCKE PK (NEEDLE) ×2 IMPLANT
NS IRRIG 1000ML POUR BTL (IV SOLUTION) ×2 IMPLANT
PACK LAMINECTOMY ORTHO (CUSTOM PROCEDURE TRAY) ×2 IMPLANT
PACK UNIVERSAL I (CUSTOM PROCEDURE TRAY) ×2 IMPLANT
PAD ARMBOARD 7.5X6 YLW CONV (MISCELLANEOUS) ×4 IMPLANT
PATTIES SURGICAL .5 X.5 (GAUZE/BANDAGES/DRESSINGS) IMPLANT
PATTIES SURGICAL .5 X1 (DISPOSABLE) ×2 IMPLANT
POSITIONER HEAD PRONE TRACH (MISCELLANEOUS) ×2 IMPLANT
PROBE BALL TIP NVM5 SNG USE (BALLOONS) ×2 IMPLANT
ROD RELINE MAS TI LORD 5.5X40 (Rod) ×4 IMPLANT
SCREW LOCK RELINE 5.5 TULIP (Screw) ×8 IMPLANT
SCREW MAS RELINE POLY 6.5X40 (Screw) ×4 IMPLANT
SCREW RELINE MAS POLY 7.5X40MM (Screw) ×4 IMPLANT
SPONGE LAP 4X18 X RAY DECT (DISPOSABLE) ×4 IMPLANT
SPONGE SURGIFOAM ABS GEL 100 (HEMOSTASIS) ×2 IMPLANT
SURGIFLO TRUKIT (HEMOSTASIS) IMPLANT
SUT BONE WAX W31G (SUTURE) ×2 IMPLANT
SUT ETHILON 3 0 FSL (SUTURE) IMPLANT
SUT MNCRL AB 3-0 PS2 18 (SUTURE) ×2 IMPLANT
SUT PDS AB 1 CTX 36 (SUTURE) IMPLANT
SUT VIC AB 0 CTB1 27 (SUTURE) ×4 IMPLANT
SUT VIC AB 1 CT1 18XCR BRD 8 (SUTURE) ×2 IMPLANT
SUT VIC AB 1 CT1 8-18 (SUTURE) ×2
SUT VIC AB 1 CTX 18 (SUTURE) ×2 IMPLANT
SUT VIC AB 2-0 CT1 18 (SUTURE) ×2 IMPLANT
SYR BULB IRRIGATION 50ML (SYRINGE) ×2 IMPLANT
SYR CONTROL 10ML LL (SYRINGE) ×2 IMPLANT
TOWEL OR 17X24 6PK STRL BLUE (TOWEL DISPOSABLE) ×2 IMPLANT
TOWEL OR 17X26 10 PK STRL BLUE (TOWEL DISPOSABLE) ×2 IMPLANT
TRAY FOLEY CATH 16FRSI W/METER (SET/KITS/TRAYS/PACK) ×2 IMPLANT
WATER STERILE IRR 1000ML POUR (IV SOLUTION) ×2 IMPLANT
YANKAUER SUCT BULB TIP NO VENT (SUCTIONS) ×2 IMPLANT

## 2014-04-15 NOTE — Progress Notes (Signed)
Spoke with Tomi Bamberger, [employer] rep, informed her that pt would be going to surgery at 0730 and that pt would be positioned on her stomach. Also informed her that no orders received from Dr. Sallyanne Kuster so emergency orders placed on chart. Tomi Bamberger reported that she would be here as soon as possible but would probably not arrive by the time pt went to surgery.

## 2014-04-15 NOTE — Transfer of Care (Signed)
Immediate Anesthesia Transfer of Care Note  Patient: [name]  Procedure(s) Performed: Procedure(s): REVISION L4-5 FUSION/ILLIAC CREST BONE GRAFT/REMOVAL AND REIMPLANTATION OF PEDICLE SCREW  (N/A)  Patient Location: PACU  Anesthesia Type:General  Level of Consciousness: awake  Airway & Oxygen Therapy: Patient Spontanous Breathing  Post-op Assessment: Report given to PACU RN  Post vital signs: Reviewed and stable  Last Vitals:  Filed Vitals:   04/15/14 0637  BP: 114/77  Pulse: 71  Temp: 36.2 C  Resp: 18    Complications: No apparent anesthesia complications

## 2014-04-15 NOTE — H&P (Signed)
History of Present Illness  The patient is a 59 year old female who comes in today for a preoperative History and Physical. The patient is scheduled for a REVISION OF L4-5 FUSION// ICBG REMOVAL AND IMPL OF PEDICLE SCREWS to be performed by Dr. Duane Lope D. Rolena Infante, MD at [facility] on 04/15/2014 . Please see the hospital record for complete dictated history and physical.  Additional reasons for visit:  Follow-up back is described as the following: The patient is being followed for their bilateral leg pain back pain. Symptoms reported today include: pain, aching, throbbing, pain with weightbearing, difficulty ambulating and foot pain (pins and needles), while the patient does not report symptoms of: swelling, locking, catching, popping, grinding, giving way or instability. The patient states that they are doing poorly. The following medication has been used for pain control: Oxycodone. The patient reports their current pain level to be 8 / 10. Note for "Follow-up back": She has been to see the neurosurgeon and surgery is advisable.  Allergies  No Known Drug Allergies04/19/2013  Family History  Severe allergy grandmother mothers side Rheumatoid Arthritis First Degree Relatives. mother Heart Disease grandfather mothers side  Social History  Tobacco use Current every day smoker. current every day smoker; smoke(d) 1 pack(s) per day Drug/Alcohol Rehab (Currently) no Drug/Alcohol Rehab (Previously) no Living situation live with spouse Illicit drug use no Marital status married Alcohol use current drinker; only occasionally per week Children 3 Current work status unemployed Exercise Exercises rarely; does running / walking Pain Contract no  Medication History  Percocet (5-325MG  Tablet, 1 (one) Oral every six hours, as needed for pain, Taken starting 02/03/2014) Active. Sertraline HCl (100MG  Tablet, Oral) Active. (qd) Mirtazapine (30MG  Tablet, Oral) Active.  (qd) Vyvanse (30MG  Capsule, Oral) Active. (QD) Omeprazole (20MG  Tablet DR, Oral) Active. (QD) ALPRAZolam XR (0.5MG  Tablet ER 24HR, Oral) Active. (BID) Medications Reconciled   Physical Exam  General Mental Status -Alert and cooperative. Gait-Normal.  Cardiovascular Cardiovascular examination reveals -on palpation PMI is normal in location and amplitude, no palpable S3 or S4. Normal cardiac borders., normal heart sounds, regular rate and rhythm with no murmurs and normal pedal pulses bilaterally.  Peripheral Vascular Lower Extremity Palpation - Calf - Bilateral - soft/supple to palpation, appearance is not suggestive of DVT.  Neurologic Reflexes-Biceps, brachioradialis, triceps, patellar, hamstring and Achilles reflexes are all normal. Testing Supine Straight Leg Raise - Bilateral - Supine straight leg raise negative.  Musculoskeletal Spine/Ribs/Pelvis Lumbosacral Spine - Evaluation of related systems reveals - well developed, well nourished and in no acute distress and alert and oriented X3. Inspection and Palpation - Tenderness - generalized. Assessment of pain reveals the following findings - The pain is characterized as - severe, constant ache, deep, pain accumulates with activity and shooting. Location - pain refers laterally to left lower back. Location - lumbar area, left anterior thigh and left lower leg. Lumbosacral Spine - ROM - painful.  Plans Transcription At this point in time, she has asked me to proceed with the revision fusion. Essentially, what this would entail would be removing the pedicle screw construct as well as removing the facet screw. Then, obtaining an iliac crest bone graft placing the posterior lateral gutter and placing new pedicle screws bilaterally with a rod construct to provide greater strength and decreased mobility. Hopefully, this will improve her overall quality of life by deceasing her back pain that is occurring secondary to her  pseudoarthrosis.  I did review the risk with her to include infection, bleeding, nerve  damage, death , stroke, paralysis, failure to heal, ongoing or worse pain, need for further surgery, nonunion, breakage of the hardware, or leak of spinal fluid.  All questions were addressed and we will plan on proceeding with this in the very near future.

## 2014-04-15 NOTE — Telephone Encounter (Signed)
Signed clearance perioperative prescription for implanted cardiac device programming to Dr. Rolena Infante for lumbar fusion.

## 2014-04-15 NOTE — Brief Op Note (Signed)
04/15/2014  10:27 AM  PATIENT:  [name]  59 y.o. female  PRE-OPERATIVE DIAGNOSIS:  PSEUDOARTHROSIS L4-5  POST-OPERATIVE DIAGNOSIS:  PSEUDOARTHROSIS L4-5  PROCEDURE:  Procedure(s): REVISION L4-5 FUSION/ILLIAC CREST BONE GRAFT/REMOVAL AND REIMPLANTATION OF PEDICLE SCREW  (N/A)  SURGEON:  Surgeon(s) and Role:    * Melina Schools, MD - Primary  PHYSICIAN ASSISTANT:   ASSISTANTS: none   ANESTHESIA:   general  EBL:  Total I/O In: 1000 [I.V.:1000] Out: 285 [Urine:135; Blood:150]  BLOOD ADMINISTERED:none  DRAINS: none   LOCAL MEDICATIONS USED:  MARCAINE     SPECIMEN:  No Specimen  DISPOSITION OF SPECIMEN:  N/A  COUNTS:  YES  TOURNIQUET:  * No tourniquets in log *  DICTATION: .Other Dictation: Dictation Number [id number]  PLAN OF CARE: Admit to inpatient   PATIENT DISPOSITION:  PACU - hemodynamically stable.

## 2014-04-15 NOTE — Progress Notes (Signed)
Orthopedic Tech Progress Note Patient Details:  [name] 03-08-1956 [id number]  Patient ID: [name], female   DOB: [birth year], 59 y.o.   MRN: [id number] RN stated that pt already has back brace  [name] 04/15/2014, 1:40 PM

## 2014-04-15 NOTE — Progress Notes (Signed)
There is no diet order for patient since she refuses to eat hospital food.

## 2014-04-15 NOTE — Anesthesia Procedure Notes (Signed)
Procedure Name: Intubation Date/Time: 04/15/2014 7:42 AM Performed by: Clearnce Sorrel Pre-anesthesia Checklist: Patient identified, Timeout performed, Emergency Drugs available, Suction available and Patient being monitored Patient Re-evaluated:Patient Re-evaluated prior to inductionOxygen Delivery Method: Circle system utilized Preoxygenation: Pre-oxygenation with 100% oxygen Intubation Type: IV induction Ventilation: Mask ventilation without difficulty Laryngoscope Size: Mac Grade View: Grade I Tube type: Oral Tube size: 7.0 mm Number of attempts: 1 Placement Confirmation: ETT inserted through vocal cords under direct vision,  positive ETCO2 and breath sounds checked- equal and bilateral Secured at: 21 cm Tube secured with: Tape Dental Injury: Teeth and Oropharynx as per pre-operative assessment

## 2014-04-15 NOTE — Anesthesia Preprocedure Evaluation (Addendum)
Anesthesia Evaluation  Patient identified by MRN, date of birth, ID band Patient awake    Reviewed: Allergy & Precautions, NPO status , Patient's Chart, lab work & pertinent test results  Airway Mallampati: II  TM Distance: >3 FB Neck ROM: Limited    Dental no notable dental hx.    Pulmonary Current Smoker,  breath sounds clear to auscultation  Pulmonary exam normal       Cardiovascular + pacemaker Rhythm:Regular Rate:Normal     Neuro/Psych Bipolar Disorder negative neurological ROS     GI/Hepatic negative GI ROS, Neg liver ROS,   Endo/Other  negative endocrine ROS  Renal/GU negative Renal ROS  negative genitourinary   Musculoskeletal negative musculoskeletal ROS (+)   Abdominal   Peds negative pediatric ROS (+)  Hematology negative hematology ROS (+)   Anesthesia Other Findings   Reproductive/Obstetrics negative OB ROS                            Anesthesia Physical Anesthesia Plan  ASA: III  Anesthesia Plan: General   Post-op Pain Management:    Induction: Intravenous  Airway Management Planned: Oral ETT  Additional Equipment:   Intra-op Plan:   Post-operative Plan: Extubation in OR  Informed Consent: I have reviewed the patients History and Physical, chart, labs and discussed the procedure including the risks, benefits and alternatives for the proposed anesthesia with the patient or authorized representative who has indicated his/her understanding and acceptance.   Dental advisory given  Plan Discussed with: CRNA, Surgeon and Anesthesiologist  Anesthesia Plan Comments:        Anesthesia Quick Evaluation

## 2014-04-15 NOTE — Anesthesia Postprocedure Evaluation (Signed)
  Anesthesia Post-op Note  Patient: [name]  Procedure(s) Performed: Procedure(s) (LRB): REVISION L4-5 FUSION/ILLIAC CREST BONE GRAFT/REMOVAL AND REIMPLANTATION OF PEDICLE SCREW  (N/A)  Patient Location: PACU  Anesthesia Type: General  Level of Consciousness: awake and alert   Airway and Oxygen Therapy: Patient Spontanous Breathing  Post-op Pain: mild  Post-op Assessment: Post-op Vital signs reviewed, Patient's Cardiovascular Status Stable, Respiratory Function Stable, Patent Airway and No signs of Nausea or vomiting  Last Vitals:  Filed Vitals:   04/15/14 1215  BP: 103/69  Pulse: 84  Temp:   Resp: 9    Post-op Vital Signs: stable   Complications: No apparent anesthesia complications

## 2014-04-15 NOTE — Progress Notes (Signed)
Pt to be in prone position during surgery, no sacral foam prophylaxis needed.

## 2014-04-15 NOTE — Progress Notes (Signed)
Utilization review completed.  

## 2014-04-15 NOTE — Progress Notes (Signed)
PT Cancellation Note  Patient Details Name: [name] MRN: [id number] DOB: 10/22/1955   Cancelled Treatment:    Reason Eval/Treat Not Completed: Patient's level of consciousness Holding PT eval per request of RN as pt with increased pain earlier and now finally sleeping. Will follow up next available time.   Candy Sledge A 04/15/2014, 4:27 PM Candy Sledge, [place], DPT [phone]

## 2014-04-15 NOTE — Progress Notes (Signed)
ANTIBIOTIC CONSULT NOTE - INITIAL  Pharmacy Consult for Vancomycin x 1 dose (no drain in place) Indication: post-op prophylaxis  Allergies  Allergen Reactions  . Prednisone     Bladder infections  . Penicillins Other (See Comments)    Yeast infection    Patient Measurements: Weight: 120 lb 3.2 oz (54.522 kg) Adjusted Body Weight: na  Vital Signs: Temp: 98 F (36.7 C) (01/28 1246) Temp Source: Oral (01/28 0637) BP: 98/63 mmHg (01/28 1244) Pulse Rate: 80 (01/28 1245) Intake/Output from previous day:   Intake/Output from this shift: Total I/O In: 1650 [I.V.:1650] Out: 435 [Urine:285; Blood:150]  Labs: No results for input(s): WBC, HGB, PLT, LABCREA, CREATININE in the last 72 hours. Estimated Creatinine Clearance: 59.4 mL/min (by C-G formula based on Cr of 0.91). No results for input(s): VANCOTROUGH, VANCOPEAK, VANCORANDOM, GENTTROUGH, GENTPEAK, GENTRANDOM, TOBRATROUGH, TOBRAPEAK, TOBRARND, AMIKACINPEAK, AMIKACINTROU, AMIKACIN in the last 72 hours.   Microbiology: Recent Results (from the past 720 hour(s))  Surgical pcr screen     Status: None   Collection Time: 04/09/14 11:35 AM  Result Value Ref Range Status   MRSA, PCR NEGATIVE NEGATIVE Final   Staphylococcus aureus NEGATIVE NEGATIVE Final    Comment:        The Xpert SA Assay (FDA approved for NASAL specimens in patients over 7 years of age), is one component of a comprehensive surveillance program.  Test performance has been validated by [facility] for patients greater than or equal to 3 year old. It is not intended to diagnose infection nor to guide or monitor treatment.     Medical History: Past Medical History  Diagnosis Date  . Syncope and collapse     echo-April 12,2012-EF 55% Echo normal; recorder with prolonged sinus pauses --> status post  . Anxiety     anxiety and mild depressive order systoms  . Vertigo   . Tobacco abuse   . Sinus pause 02/14/2013  . NWGNFAOZ(308.6)     "monthly"  (02/20/2013)  . Bipolar disorder      (02/20/2013)  . PONV (postoperative nausea and vomiting)   . Pacemaker     medtronic  . Dysrhythmia     Assessment: 59 yo female s/p revision of L4-5 fusion, bone graft and screw insertion.  Pharmacy asked to dose vancomycin x 1 dose post op.  No drains in place.  Received 1g of vancomycin pre-op today at 745 AM.  Scr 0.91 on pre-op lab work. Est CrCl ~ 60 ml/min  Goal of Therapy:  prevention of post-op infection  Plan:  1. Vancomycin 500 mg IV x 1 at 7 PM. 2. Pharmacy will sign-off, please contact if additional questions.  Thanks!  Uvaldo Rising, BCPS  Clinical Pharmacist Pager [phone]  04/15/2014 1:40 PM

## 2014-04-16 ENCOUNTER — Inpatient Hospital Stay (HOSPITAL_COMMUNITY): Payer: Federal, State, Local not specified - PPO

## 2014-04-16 MED FILL — Thrombin For Soln 20000 Unit: CUTANEOUS | Qty: 1 | Status: AC

## 2014-04-16 NOTE — Op Note (Signed)
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  OPERATIVE PROCEDURE: 1. Removal of right L4-L5 pedicle screws and reimplantation of new     pedicle screws. 2. Removal of left facet screw at L4-5 and implantation of segmental     pedicle screw instrumentation. 3. Posterolateral arthrodesis with iliac crest bone with autograft and     DBX mix. 4. Posterior iliac crest bone graft harvest.  COMPLICATIONS:  None.  CONDITION:  Stable.  INTRAOPERATIVE FINDINGS:  The right L4 pedicle screw was grossly loose and freely mobile.  Instrumentation used was the NuVasive pedicle screw system on the left side.  I placed 6.5 diameter 40 mm length screws on the right side.  I placed 40 mm length screws, but they were 7.5 diameter screws.  HISTORY:  This is a very pleasant woman, who approximately 4 or 5 years ago had a TLIF procedure done.  The patient did well initially, but she has continued to have over the last several months progressive debilitating back, buttock, and occasional left leg pain.  CT scan demonstrated a pseudoarthrosis and we elected to proceed with the aforementioned procedure.  All appropriate risks, benefits, and alternatives of surgery were discussed and consent was obtained.  OPERATIVE NOTE:  The patient was brought to the operating room, placed supine on the operating room table.  After successful induction of general anesthesia and endotracheal intubation, TEDs, SCDs and Foley were inserted.  She was  turned prone onto the spine frame.  All bony prominences were well padded and the back was prepped and draped in standard fashion.  Time-out was taken to confirm the patient, procedure, and all other pertinent important data.  Once this was done, the previous midline incision was reincised after infiltrating with 0.25% Marcaine.  I made the incision, dissected down to the deep fascia.  The deep fascia was sharply incised and I began my approach into the left lateral side.  Care was taken not to go straight medial as this can cause CSF leak.  I dissected through the scar until I was able to palpate the facet screw.  I then identified the L4-5 and L3- 4 facet complexes.  I then dissected the lateral to the facet complex to expose the 4 and 5 transverse processes.  I then removed the facet screw from the L4-5 facet complex.  I then used a high-speed bur, and curette to debride the scar tissue and expose bleeding subchondral bone in the posterolateral gutter.  Once this was done, I then used the NuVasive Jamshidi needle, placed on the lateral side of the pedicle just at the junction of the transverse process and facet.  Using fluoroscopic guidance as well as the free running EMGs, I advanced the Jamshidi  needle down to the medial border of the pedicle.  I then went into the lateral plane and confirmed that I was just beyond the posterior wall of the vertebral body.  Satisfied with my trajectory, I advanced the Jamshidi into the L4 vertebral body.  I then placed the guide pin to cannulate this spur, I repeated the same exact procedure at the L5 level.  With the left L4-5 pedicles down cannulated and the posterolateral gutter debrided and burred, I then undermined my incision and palpated along the fascia until I could feel the right iliac crest. She is having more left-sided pain than right.  I elected to use the right-sided iliac crest.  Once I was over the iliac crest, I bovied the fascia  and exposed the cortical bone.  I used an osteotome to open a cap and then used curettes to remove the bone graft.  There was an ample supply of cancellous bone.  Once I had harvested an adequate amount, I mixed it with DBX mix in order to have a better overall fusion rate.  I irrigated out the wound where the iliac crest was and packed Gelfoam into the iliac crest site and then closed the fascia over the iliac crest with #1 Vicryl sutures.  I then returned to the posterolateral gutter.  I then placed the bone graft in the posterolateral gutter and then advanced the screws down to the appropriate depth.  I then took a 40 mm rod and locked it into the pedicle screw head and torqued off the locking nuts according to the manufacture's standards.  At this point, with the left posterolateral fusion and instrumentation completed, I then went to the right side of the body.  At this time, I dissected down until I could palpate the L4-L5 pedicle screws that were placed before. I completely exposed these.  I then removed them.  I noted that the left L4 screw was loose.  I then used my bur and curette to debride the facet complex cyst until I had some bleeding subchondral bone.  I then tapped down the pedicle screw holes and so I could refresh them.  Because the L4 screw was somewhat loose, I packed bone graft down into this pedicle screw hole to improve the fixation of the new pedicle screw.  I also increased the diameter of the pedicle screws to get better purchase.  I then probed each of the holes with a ball-tip Feeler, confirming an adequate pedicle screw hole.  I then placed the pedicle screws into the holes and advanced them down to the appropriate depth.  I then packed the posterolateral corner with bone graft.  I used the same size rod and locked it down appropriately.  At this point, all screws were properly torqued according to manufacturer's standards.  At this point, I had completed  the revision fusion without any complicating features.  At this point, I irrigated copiously with normal saline, closed the deep fascia with interrupted #1 Vicryl sutures, superficial with 2-0 Vicryl sutures, and 3-0 Monocryl for the skin. Steri-Strips and a dry dressing were applied.  The patient was extubated, transferred to PACU without incident.  At the end of the case, all needle and sponge counts were correct.     Dahlia Bailiff, MD     DDB/MEDQ  D:  04/15/2014  T:  04/16/2014  Job:  [id number]

## 2014-04-16 NOTE — Evaluation (Signed)
Physical Therapy Evaluation/Discharge Patient Details Name: [name] MRN: [id number] DOB: 24-Sep-1955 Today's Date: 04/16/2014   History of Present Illness  59 y.o. female admitted to [facility] on 04/15/14 s/p revision of L4-5 fusion. Pt with significant PMHx of syncope, anxiety, vertigo, HA, bipolar d/o, pacemaker, dysrythmia, posterior lumbar fusion in 2009, and cervical fusion (11/2013).  Clinical Impression  Pt is mobilizing with min guard hand held assist and her husband has taken some time off of work while she recovers to provide 24/7 assist.  Back education completed and she demonstrated adequate mobility for safe d/c home with her husband today.  She has no equipment or HHPT needs at this time.  PT to sign off.     Follow Up Recommendations No PT follow up    Equipment Recommendations  None recommended by PT    Recommendations for Other Services   NA    Precautions / Restrictions Precautions Precautions: Back;Fall Precaution Booklet Issued: Yes (comment) Precaution Comments: handout given and precautions reviewed.  pt is mildly unsteady on her feet.  Required Braces or Orthoses: Spinal Brace Spinal Brace: Lumbar corset (no order for corset, so donne in sitting)      Mobility  Bed Mobility Overal bed mobility: Needs Assistance Bed Mobility: Rolling;Sidelying to Sit;Sit to Sidelying Rolling: Supervision Sidelying to sit: Supervision     Sit to sidelying: Supervision General bed mobility comments: supervision for safety and verbal cues to reinforce log roll and reverse log roll techniques.   Transfers Overall transfer level: Needs assistance Equipment used: 1 person hand held assist Transfers: Sit to/from Stand Sit to Stand: Min guard         General transfer comment: Min guard assist for safety, pt with a bit of sway in standing.   Ambulation/Gait Ambulation/Gait assistance: Min guard Ambulation Distance (Feet): 100 Feet Assistive device: 1 person hand held  assist (IV pole) Gait Pattern/deviations: Step-through pattern;Staggering left;Staggering right Gait velocity: decreased Gait velocity interpretation: Below normal speed for age/gender General Gait Details: mildly staggering gait pattern easily corrected by min hand held assist.  We spoke at length re: RW and pt would prefer to just have her husband help her walk for now.  She is confident that the imbalance will improve in a few days.          Balance Overall balance assessment: Needs assistance Sitting-balance support: Feet supported;No upper extremity supported Sitting balance-Leahy Scale: Good     Standing balance support: Bilateral upper extremity supported;Single extremity supported;No upper extremity supported Standing balance-Leahy Scale: Fair                               Pertinent Vitals/Pain Pain Assessment: 0-10 Pain Score: 7  Pain Location: low back and bil legs Pain Descriptors / Indicators: Aching Pain Intervention(s): Limited activity within patient's tolerance;Monitored during session;Repositioned    Home Living Family/patient expects to be discharged to:: Private residence Living Arrangements: Spouse/significant other;Children (daughter who is in school and husband who works full time) Available Help at Discharge: Family;Available PRN/intermittently Type of Home: Apartment Home Access: Level entry     Home Layout: One level Home Equipment: None      Prior Function Level of Independence: Independent               Hand Dominance   Dominant Hand: Right    Extremity/Trunk Assessment   Upper Extremity Assessment: Defer to OT evaluation  Lower Extremity Assessment: Overall WFL for tasks assessed      Cervical / Trunk Assessment: Other exceptions  Communication   Communication: No difficulties  Cognition Arousal/Alertness: Awake/alert Behavior During Therapy: WFL for tasks assessed/performed Overall Cognitive  Status: Within Functional Limits for tasks assessed                      General Comments General comments (skin integrity, edema, etc.): Educated pt re lifting restrictions, activity progress, proper brace positioning, no sitting for longer than 30-45 mins and functional examples of back precautions.           Assessment/Plan    PT Assessment Patent does not need any further PT services  PT Diagnosis Difficulty walking;Abnormality of gait;Generalized weakness;Acute pain         PT Goals (Current goals can be found in the Care Plan section) Acute Rehab PT Goals Patient Stated Goal: to go home today PT Goal Formulation: All assessment and education complete, DC therapy               End of Session Equipment Utilized During Treatment: Back brace Activity Tolerance: Patient limited by pain Patient left: in bed;with call bell/phone within reach;with family/visitor present Nurse Communication: Mobility status         Time: 1152-1212 PT Time Calculation (min) (ACUTE ONLY): 20 min   Charges:   PT Evaluation $Initial PT Evaluation Tier I: 1 Procedure PT Treatments $Self Care/Home Management: 8-22        [name] B. Gilberts, Amasa, DPT [phone]   04/16/2014, 5:35 PM

## 2014-04-16 NOTE — Progress Notes (Signed)
04/16/14 OT recommended 3N1. Spoke with patient, she stated that she did not want a 3N1.No other equipment needs identified.

## 2014-04-16 NOTE — Evaluation (Signed)
Occupational Therapy Evaluation Patient Details Name: [name] MRN: [id number] DOB: 08-26-1955 Today's Date: 04/16/2014    History of Present Illness s/p REVISION L4-5 FUSION/ILLIAC CREST BONE GRAFT/REMOVAL AND REIMPLANTATION OF PEDICLE SCREW     Clinical Impression   Patient independent PTA. Patient currently functioning at an overall supervision>min assist level. Patient will benefit from acute OT to increase overall independence in the areas of ADLs, functional mobility/transfers, and overall safety in order to safely discharge home.     Follow Up Recommendations  No OT follow up;Supervision/Assistance - 24 hour    Equipment Recommendations  3 in 1 bedside comode    Recommendations for Other Services  None at this time     Precautions / Restrictions Precautions Precautions: Back;Fall Precaution Comments: Patient able to state 3/3 back precautions in own words Restrictions Weight Bearing Restrictions: No      Mobility Bed Mobility Overal bed mobility: Needs Assistance Bed Mobility: Rolling;Supine to Sit Rolling: Supervision   Supine to sit: Supervision     General bed mobility comments: Cues required for back precautions, patient used bed rails and HOB elevated  Transfers Overall transfer level: Needs assistance   Transfers: Sit to/from Stand;Stand Pivot Transfers Sit to Stand: Min guard Stand pivot transfers: Min guard       General transfer comment: Cues required for safety, patient with increased anxiety during sit<>stand and stand pivot transfer    Balance Overall balance assessment: Needs assistance Sitting-balance support: No upper extremity supported;Feet supported       Standing balance support: No upper extremity supported Standing balance-Leahy Scale: Fair    ADL Overall ADL's : Needs assistance/impaired Eating/Feeding: Independent   Grooming: Standing;Supervision/safety   Upper Body Bathing: Set up;Sitting   Lower Body Bathing: Min  guard;Sit to/from stand;Cueing for safety   Upper Body Dressing : Supervision/safety;Sitting   Lower Body Dressing: Min guard;Sit to/from stand;Cueing for safety   Toilet Transfer: Min guard;BSC;Ambulation   Toileting- Water quality scientist and Hygiene: Min guard;Sit to/from stand       Functional mobility during ADLs: Min guard;Cueing for safety General ADL Comments: Patient unsteady during OT eval. Will like to see her again to make sure she has a safe transition > home. Patient able to cross BLEs for LB ADLs. Patient able to independent verbalize 3/3 back precautions. Increased anxiety secondary to pain, at one point patient stated "I can't do this". Provided encourgement.                Pertinent Vitals/Pain Pain Assessment: 0-10 Pain Score: 8  Pain Location: back and radiating to BLEs Pain Descriptors / Indicators: Aching;Radiating Pain Intervention(s): Monitored during session;Premedicated before session     Hand Dominance Right   Extremity/Trunk Assessment Upper Extremity Assessment Upper Extremity Assessment: Overall WFL for tasks assessed   Lower Extremity Assessment Lower Extremity Assessment: Defer to PT evaluation   Cervical / Trunk Assessment Cervical / Trunk Assessment: Normal   Communication Communication Communication: No difficulties   Cognition Arousal/Alertness: Awake/alert Behavior During Therapy: WFL for tasks assessed/performed Overall Cognitive Status: Within Functional Limits for tasks assessed             Home Living Family/patient expects to be discharged to:: Private residence Living Arrangements: Spouse/significant other;Children (daughter who is in school and husband who works full time) Available Help at Discharge: Family;Available PRN/intermittently Type of Home: Apartment Home Access: Level entry     Home Layout: One level     Bathroom Shower/Tub: Tub/shower unit;Curtain   Biochemist, clinical: Standard  Home Equipment:  None          Prior Functioning/Environment Level of Independence: Independent             OT Diagnosis: Generalized weakness;Acute pain   OT Problem List: Decreased strength;Decreased activity tolerance;Impaired balance (sitting and/or standing);Decreased safety awareness;Decreased knowledge of use of DME or AE;Decreased knowledge of precautions;Pain   OT Treatment/Interventions: Self-care/ADL training;Energy conservation;DME and/or AE instruction;Therapeutic activities;Patient/family education;Balance training    OT Goals(Current goals can be found in the care plan section) Acute Rehab OT Goals Patient Stated Goal: get up OT Goal Formulation: With patient Time For Goal Achievement: 04/23/14 Potential to Achieve Goals: Good ADL Goals Pt Will Perform Lower Body Bathing: with modified independence;sit to/from stand Pt Will Perform Lower Body Dressing: with modified independence;sit to/from stand Pt Will Transfer to Toilet: with modified independence;ambulating Pt Will Perform Tub/Shower Transfer: with modified independence;ambulating;3 in 1 Additional ADL Goal #1: Patient will independently adhere to back precautions during functional mobility/transfers and ADL Additional ADL Goal #2: Patient will independently don/doff lumbar corset in prep for ADL and functional mobility  OT Frequency: Min 2X/week   Barriers to D/C: Decreased caregiver support          End of Session Equipment Utilized During Treatment: Back brace  Activity Tolerance: Patient tolerated treatment well Patient left: in chair;Other (comment) (with transport tech taking patient down for X-ray)   Time: 0807-0820 OT Time Calculation (min): 13 min Charges:  OT Evaluation $Initial OT Evaluation Tier I: 1 Procedure  Heaven Wandell , MS, OTR/L, CLT Pager: [phone]  04/16/2014, 8:31 AM

## 2014-04-16 NOTE — Progress Notes (Signed)
    Subjective: Procedure(s) (LRB): REVISION L4-5 FUSION/ILLIAC CREST BONE GRAFT/REMOVAL AND REIMPLANTATION OF PEDICLE SCREW  (N/A) 1 Day Post-Op  Patient reports pain as 2 on 0-10 scale.  Reports decreased leg pain reports incisional back pain   Positive void Negative bowel movement Positive flatus Negative chest pain or shortness of breath  Objective: Vital signs in last 24 hours: Temp:  [96.8 F (36 C)-99 F (37.2 C)] 99 F (37.2 C) (01/29 0115) Pulse Rate:  [72-103] 85 (01/29 0500) Resp:  [7-16] 16 (01/29 0500) BP: (87-121)/(51-73) 93/64 mmHg (01/29 0500) SpO2:  [93 %-100 %] 93 % (01/29 0500)  Intake/Output from previous day: 01/28 0701 - 01/29 0700 In: 2130 [P.O.:480; I.V.:1650] Out: 1435 [Urine:1285; Blood:150]  Labs: No results for input(s): WBC, RBC, HCT, PLT in the last 72 hours. No results for input(s): NA, K, CL, CO2, BUN, CREATININE, GLUCOSE, CALCIUM in the last 72 hours. No results for input(s): LABPT, INR in the last 72 hours.  Physical Exam: Neurologically intact ABD soft Neurovascular intact Intact pulses distally Incision: dressing C/D/I Compartment soft  Assessment/Plan: Patient stable Continue mobilization with physical therapy Continue care  Advance diet Up with therapy  Ok for d/c to home after cleared by PT  Melina Schools, MD [place] [phone]

## 2014-04-18 NOTE — Discharge Summary (Signed)
Patient ID: [name] MRN: [id number] DOB/AGE: [birth year] 59 y.o.  Admit date: 04/15/2014 Discharge date: 04/18/2014  Admission Diagnoses:  Active Problems:   Pseudoarthrosis of lumbar spine   Discharge Diagnoses:  Active Problems:   Pseudoarthrosis of lumbar spine  status post Procedure(s): REVISION L4-5 FUSION/ILLIAC CREST BONE GRAFT/REMOVAL AND REIMPLANTATION OF PEDICLE SCREW   Past Medical History  Diagnosis Date  . Syncope and collapse     echo-April 12,2012-EF 55% Echo normal; recorder with prolonged sinus pauses --> status post  . Anxiety     anxiety and mild depressive order systoms  . Vertigo   . Tobacco abuse   . Sinus pause 02/14/2013  . NWGNFAOZ(308.6)     "monthly" (02/20/2013)  . Bipolar disorder      (02/20/2013)  . PONV (postoperative nausea and vomiting)   . Pacemaker     medtronic  . Dysrhythmia     Surgeries: Procedure(s): REVISION L4-5 FUSION/ILLIAC CREST BONE GRAFT/REMOVAL AND REIMPLANTATION OF PEDICLE SCREW  on 04/15/2014   Consultants:    Discharged Condition: Improved  Hospital Course: [name] is an 59 y.o. female who was admitted 04/15/2014 for operative treatment of <principal problem not specified>. Patient failed conservative treatments (please see the history and physical for the specifics) and had severe unremitting pain that affects sleep, daily activities and work/hobbies. After pre-op clearance, the patient was taken to the operating room on 04/15/2014 and underwent  Procedure(s): REVISION L4-5 FUSION/ILLIAC CREST BONE GRAFT/REMOVAL AND REIMPLANTATION OF PEDICLE SCREW .    Patient was given perioperative antibiotics:  Anti-infectives    Start     Dose/Rate Route Frequency Ordered Stop   04/16/14 1900  vancomycin (VANCOCIN) 500 mg in sodium chloride 0.9 % 100 mL IVPB  Status:  Discontinued     500 mg100 mL/hr over 60 Minutes Intravenous  Once 04/15/14 1342 04/16/14 1557   04/14/14 1504  vancomycin (VANCOCIN) IVPB 1000 mg/200  mL premix     1,000 mg200 mL/hr over 60 Minutes Intravenous 60 min pre-op 04/14/14 1504 04/15/14 0845       Patient was given sequential compression devices and early ambulation to prevent DVT.   Patient benefited maximally from hospital stay and there were no complications. At the time of discharge, the patient was urinating/moving their bowels without difficulty, tolerating a regular diet, pain is controlled with oral pain medications and they have been cleared by PT/OT.   Recent vital signs: No data found.    Recent laboratory studies: No results for input(s): WBC, HGB, HCT, PLT, NA, K, CL, CO2, BUN, CREATININE, GLUCOSE, INR, CALCIUM in the last 72 hours.  Invalid input(s): PT, 2   Discharge Medications:     Medication List    STOP taking these medications        oxyCODONE-acetaminophen 5-325 MG per tablet  Commonly known as:  PERCOCET/ROXICET  Replaced by:  oxyCODONE-acetaminophen 10-325 MG per tablet     tiZANidine 4 MG tablet  Commonly known as:  ZANAFLEX      TAKE these medications        ALPRAZolam 0.5 MG tablet  Commonly known as:  XANAX  Take 1 mg by mouth at bedtime.     docusate sodium 100 MG capsule  Commonly known as:  COLACE  Take 1 capsule (100 mg total) by mouth 3 (three) times daily as needed for mild constipation.     lisdexamfetamine 30 MG capsule  Commonly known as:  VYVANSE  Take 30 mg by mouth daily.  methocarbamol 500 MG tablet  Commonly known as:  ROBAXIN  Take 1 tablet (500 mg total) by mouth 3 (three) times daily as needed for muscle spasms.     mirtazapine 30 MG tablet  Commonly known as:  REMERON  Take 30 mg by mouth at bedtime.     omeprazole 40 MG capsule  Commonly known as:  PRILOSEC  Take 40 mg by mouth daily.     ondansetron 4 MG tablet  Commonly known as:  ZOFRAN  Take 1 tablet (4 mg total) by mouth every 8 (eight) hours as needed for nausea or vomiting.     oxyCODONE-acetaminophen 10-325 MG per tablet  Commonly known  as:  PERCOCET  Take 1 tablet by mouth every 4 (four) hours as needed for pain.     sertraline 100 MG tablet  Commonly known as:  ZOLOFT  Take 100 mg by mouth daily.     SUMAtriptan 100 MG tablet  Commonly known as:  IMITREX  Take 100 mg by mouth every 2 (two) hours as needed for migraine or headache. May repeat in 2 hours if headache persists or recurs.        Diagnostic Studies: Dg Chest 2 View  03/24/2014   CLINICAL DATA:  Smoker.  Preoperative chest x-ray.  EXAM: CHEST  2 VIEW  COMPARISON:  02/21/2013.  FINDINGS: Mediastinum and hilar structures are normal. Prominent epicardial fat pad. Cardiac pacer with lead tips in right atrium right ventricle. Heart size stable. No focal pulmonary infiltrate or pleural effusion. No pneumothorax. No acute osseous abnormality.  IMPRESSION: No acute cardiopulmonary disease. Cardiac pacer noted with lead tips in the right atrium and right ventricle.   Electronically Signed   By: Marcello Moores  Register   On: 03/24/2014 16:03   Dg Lumbar Spine 2-3 Views  04/16/2014   CLINICAL DATA:  Status post L4-5 posterior fusion  EXAM: LUMBAR SPINE - 2-3 VIEW  COMPARISON:  04/15/2014  FINDINGS: Patient is status post posterior fusion at N8-6 with a metallic disc spacer at this level. Postop changes of the soft tissues posteriorly. Normal alignment. No compression fracture or or focal abnormality. No complicating feature or hardware abnormality. Nonspecific mild gaseous distention of the bowel.  IMPRESSION: Status post L4-5 posterior fusion without acute osseous finding or complicating feature.   Electronically Signed   By: Daryll Brod M.D.   On: 04/16/2014 08:52   Dg Lumbar Spine 2-3 Views  04/15/2014   CLINICAL DATA:  Revision of L4-5 fusion and bone grafting, re- implantation of pedicle screws for pseudarthrosis at L4-L5  EXAM:  DG C-ARM 61-120 MIN; LUMBAR SPINE - 2-3 VIEW  COMPARISON:  None.  FINDINGS: Fluoroscopy time was not reported. The patient has undergone L4-5 fusion  with placement of pedicle screws and connecting rods and an intradiscal device. Radiographic positioning of the prosthetic components is good.  IMPRESSION: Patient is status post reimplantation pedicle screws at L4-5 without evidence of immediate postprocedure complication.   Electronically Signed   By: David  Martinique   On: 04/15/2014 10:29   Dg C-arm 61-120 Min  04/15/2014   CLINICAL DATA:  Revision of L4-5 fusion and bone grafting, re- implantation of pedicle screws for pseudarthrosis at L4-L5  EXAM:  DG C-ARM 61-120 MIN; LUMBAR SPINE - 2-3 VIEW  COMPARISON:  None.  FINDINGS: Fluoroscopy time was not reported. The patient has undergone L4-5 fusion with placement of pedicle screws and connecting rods and an intradiscal device. Radiographic positioning of the prosthetic components is good.  IMPRESSION: Patient is status post reimplantation pedicle screws at L4-5 without evidence of immediate postprocedure complication.   Electronically Signed   By: David  Martinique   On: 04/15/2014 10:29          Follow-up Information    Follow up with Dahlia Bailiff, MD. Schedule an appointment as soon as possible for a visit in 2 weeks.   Specialty:  Orthopedic Surgery   Why:  For suture removal, For wound re-check   Contact information:   [address] [place] [address] [phone]       Discharge Plan:  discharge to home  Disposition: doing well f/u 2 weeks    Signed: Melina Schools D for Dr. Melina Schools [facility] [phone] 04/18/2014, 5:49 PM

## 2014-06-10 ENCOUNTER — Other Ambulatory Visit: Payer: Self-pay | Admitting: Orthopedic Surgery

## 2014-06-10 DIAGNOSIS — Z981 Arthrodesis status: Secondary | ICD-10-CM

## 2014-06-15 ENCOUNTER — Encounter: Payer: Self-pay | Admitting: Cardiovascular Disease

## 2014-06-15 ENCOUNTER — Ambulatory Visit (INDEPENDENT_AMBULATORY_CARE_PROVIDER_SITE_OTHER): Payer: Federal, State, Local not specified - PPO | Admitting: Cardiovascular Disease

## 2014-06-15 VITALS — BP 109/79 | HR 95 | Resp 16 | Ht 67.0 in | Wt 118.4 lb

## 2014-06-15 DIAGNOSIS — Z720 Tobacco use: Secondary | ICD-10-CM

## 2014-06-15 DIAGNOSIS — I455 Other specified heart block: Secondary | ICD-10-CM | POA: Diagnosis not present

## 2014-06-15 DIAGNOSIS — R55 Syncope and collapse: Secondary | ICD-10-CM

## 2014-06-15 DIAGNOSIS — Z950 Presence of cardiac pacemaker: Secondary | ICD-10-CM

## 2014-06-15 LAB — MDC_IDC_ENUM_SESS_TYPE_INCLINIC
Battery Impedance: 135 Ohm
Battery Remaining Longevity: 162 mo
Battery Voltage: 2.79 V
Brady Statistic AP VP Percent: 0 %
Brady Statistic AP VS Percent: 0 %
Brady Statistic AS VP Percent: 0 %
Brady Statistic AS VS Percent: 100 %
Date Time Interrogation Session: 20160329145758
Lead Channel Impedance Value: 425 Ohm
Lead Channel Impedance Value: 464 Ohm
Lead Channel Pacing Threshold Amplitude: 0.375 V
Lead Channel Pacing Threshold Amplitude: 1.625 V
Lead Channel Pacing Threshold Pulse Width: 0.4 ms
Lead Channel Pacing Threshold Pulse Width: 0.4 ms
Lead Channel Sensing Intrinsic Amplitude: 0.7 mV
Lead Channel Sensing Intrinsic Amplitude: 5.6 mV
Lead Channel Setting Pacing Amplitude: 2 V
Lead Channel Setting Pacing Amplitude: 3.25 V
Lead Channel Setting Pacing Pulse Width: 0.4 ms
Lead Channel Setting Sensing Sensitivity: 2 mV

## 2014-06-15 NOTE — Progress Notes (Signed)
Patient ID: [name], female   DOB: [birth year], 57 y.o.   MRN: [id number]      Cardiology Office Note   Date:  06/17/2014   ID:  [name], DOB Feb 14, 1956, MRN [id number]  PCP:  Sherian Maroon, MD  Cardiologist:   Sanda Klein, MD   Chief Complaint  Patient presents with  . Follow-up    pt c/o pain in left breast and under armpit, pt had a back fusion in Jan      History of Present Illness: [name] is a 59 y.o. female who presents for pacemaker check (Medtronic Adapta dual chamber 2014). The device was implanted for recurrent syncope/near syncope due to sinus pauses, possibly related to neurally mediated mechanism. She continues to smoke. She describes left chest and axillary pain that sounds musculoskeletal and is positional. She had recent lumbar spine fusion surgery. She has not had syncope, but had a "panic attack" earlier this year.  There is < 1% atrial or ventricular pacing. A few episodes of mode switch are related to far field R wave oversensing and appropriate setting changes were made. She has very rrae and brief episodes of nonsustained VT.   Past Medical History  Diagnosis Date  . Syncope and collapse     echo-April 12,2012-EF 55% Echo normal; recorder with prolonged sinus pauses --> status post  . Anxiety     anxiety and mild depressive order systoms  . Vertigo   . Tobacco abuse   . Sinus pause 02/14/2013  . ACZYSAYT(016.0)     "monthly" (02/20/2013)  . Bipolar disorder      (02/20/2013)  . PONV (postoperative nausea and vomiting)   . Pacemaker     medtronic  . Dysrhythmia     Past Surgical History  Procedure Laterality Date  . Transthoracic echocardiogram  07/11/2010    The left atrial size is normal.There is no evidence of mitral vavle prolaspe.Right ventriicular systolic pressure is normal . Injection of contrast documented no interatrial shunt. Essentially normal 2D echo -doppler study   . Abdominal hysterectomy  03/19/1980   "partial"  . Insert / replace / remove pacemaker  02/20/2013    medtronic  . Posterior lumbar fusion  ~ 2009  . Back surgery    . Cervical fusion Left 11/2013    C4-C6  . Loop recorder implant N/A 11/06/2012    Procedure: LINQ LOOP RECORDER IMPLANT;  Surgeon: Sanda Klein, MD;  Location: [place] CATH [facility];  Service: Cardiovascular;  Laterality: N/A;  . Permanent pacemaker insertion N/A 02/20/2013    Procedure: PERMANENT PACEMAKER INSERTION;  Surgeon: Sanda Klein, MD;  Location: [place] C[facility];  Service: Cardiovascular;  Laterality: N/A;  . Cervical fusion      3,4,5,     Current Outpatient Prescriptions  Medication Sig Dispense Refill  . ALPRAZolam (XANAX) 0.5 MG tablet Take 1 mg by mouth 2 (two) times daily as needed.     . Armodafinil 150 MG tablet Take 150 mg by mouth daily.    . methocarbamol (ROBAXIN) 500 MG tablet Take 1 tablet (500 mg total) by mouth 3 (three) times daily as needed for muscle spasms. 60 tablet 0  . mirtazapine (REMERON) 30 MG tablet Take 30 mg by mouth at bedtime.    . naproxen (NAPROSYN) 500 MG tablet Take 500 mg by mouth as needed.    . ondansetron (ZOFRAN) 4 MG tablet Take 1 tablet (4 mg total) by mouth every 8 (eight) hours as needed for nausea or vomiting. 20 tablet 0  .  oxyCODONE-acetaminophen (PERCOCET) 10-325 MG per tablet Take 1 tablet by mouth every 4 (four) hours as needed for pain. 60 tablet 0  . pantoprazole (PROTONIX) 40 MG tablet Take 1 tablet by mouth daily.    . sertraline (ZOLOFT) 100 MG tablet Take 100 mg by mouth daily.    . SUMAtriptan (IMITREX) 100 MG tablet Take 100 mg by mouth every 2 (two) hours as needed for migraine or headache. May repeat in 2 hours if headache persists or recurs.    Marland Kitchen tiZANidine (ZANAFLEX) 4 MG tablet Take 1 tablet by mouth as needed.  1   No current facility-administered medications for this visit.    Allergies:   Prednisone and Penicillins    Social History:  The patient  reports that she has been smoking Cigarettes.   She has a 40 pack-year smoking history. She has never used smokeless tobacco. She reports that she does not drink alcohol or use illicit drugs.   Family History:  The patient's family history includes Heart attack in her brother. She was adopted. Grandchild was still born and had genetic marker for ARVD   ROS:  Please see the history of present illness.    Otherwise, review of systems positive for none.   All other systems are reviewed and negative.    PHYSICAL EXAM: VS:  BP 109/79 mmHg  Pulse 95  Resp 16  Ht 5\' 7"  (1.702 m)  Wt 118 lb 6.4 oz (53.706 kg)  BMI 18.54 kg/m2 , BMI Body mass index is 18.54 kg/(m^2).  General: Alert, oriented x3, no distress Head: no evidence of trauma, PERRL, EOMI, no exophtalmos or lid lag, no myxedema, no xanthelasma; normal ears, nose and oropharynx Neck: normal jugular venous pulsations and no hepatojugular reflux; brisk carotid pulses without delay and no carotid bruits Chest: clear to auscultation, no signs of consolidation by percussion or palpation, normal fremitus, symmetrical and full respiratory excursions; healthy pacemaker site Cardiovascular: normal position and quality of the apical impulse, regular rhythm, normal first and second heart sounds, no murmurs, rubs or gallops Abdomen: no tenderness or distention, no masses by palpation, no abnormal pulsatility or arterial bruits, normal bowel sounds, no hepatosplenomegaly Extremities: no clubbing, cyanosis or edema; 2+ radial, ulnar and brachial pulses bilaterally; 2+ right femoral, posterior tibial and dorsalis pedis pulses; 2+ left femoral, posterior tibial and dorsalis pedis pulses; no subclavian or femoral bruits Neurological: grossly nonfocal Psych: euthymic mood, full affect   EKG:  EKG is not ordered today.   Recent Labs: 02/16/2014: Magnesium 2.1 04/09/2014: BUN 9; Creatinine 0.91; Hemoglobin 14.0; Platelets 220; Potassium 3.8; Sodium 140    Lipid Panel No results found for: CHOL,  TRIG, HDL, CHOLHDL, VLDL, LDLCALC, LDLDIRECT    Wt Readings from Last 3 Encounters:  06/15/14 118 lb 6.4 oz (53.706 kg)  04/15/14 120 lb 3.2 oz (54.522 kg)  02/16/14 123 lb (55.792 kg)       ASSESSMENT AND PLAN:  1.  No recurrence of near syncope since pacemaker implantation. The rarity of pacemaker intervention seems to confirm that her episodes of sinus arrest were neurally mediated rather than due to conduction system disease.  2. Discussed the importance of smoking cessation in detail. She is "afraid to try".   Current medicines are reviewed at length with the patient today.  The patient does not have concerns regarding medicines.  The following changes have been made:  no change  Labs/ tests ordered today include:  Orders Placed This Encounter  Procedures  . Implantable  device check    Patient Instructions  Remote monitoring is used to monitor your pacemaker from home. This monitoring reduces the number of office visits required to check your device to one time per year. It allows [place] to keep an eye on the functioning of your device to ensure it is working properly. You are scheduled for a device check from home on 09/14/2014. You may send your transmission at any time that day. If you have a wireless device, the transmission will be sent automatically. After your physician reviews your transmission, you will receive a postcard with your next transmission date.  Your physician recommends that you schedule a follow-up appointment in: 12 months with Dr.Delores Thelen       Mikael Spray, MD  06/17/2014 12:00 PM    Sanda Klein, MD, [facility] [phone] office [phone] pager

## 2014-06-15 NOTE — Patient Instructions (Signed)
Remote monitoring is used to monitor your pacemaker from home. This monitoring reduces the number of office visits required to check your device to one time per year. It allows [place] to keep an eye on the functioning of your device to ensure it is working properly. You are scheduled for a device check from home on 09/14/2014. You may send your transmission at any time that day. If you have a wireless device, the transmission will be sent automatically. After your physician reviews your transmission, you will receive a postcard with your next transmission date.  Your physician recommends that you schedule a follow-up appointment in: 12 months with Dr.Croitoru

## 2014-06-17 ENCOUNTER — Encounter: Payer: Self-pay | Admitting: Cardiovascular Disease

## 2014-06-23 ENCOUNTER — Inpatient Hospital Stay: Admission: RE | Admit: 2014-06-23 | Payer: Federal, State, Local not specified - PPO | Source: Ambulatory Visit

## 2014-09-06 ENCOUNTER — Other Ambulatory Visit: Payer: Self-pay | Admitting: Otolaryngology

## 2014-09-06 ENCOUNTER — Telehealth (HOSPITAL_COMMUNITY): Payer: Self-pay | Admitting: *Deleted

## 2014-09-06 NOTE — Telephone Encounter (Signed)
Left message on voicemail about appointment---09/09/14 at 10 am.

## 2014-09-06 NOTE — Telephone Encounter (Signed)
Spoke to [name] , Dr Redmond Baseman should order the carotid doppler if needed since he saw Kim for the condition. Dr Redmond Baseman can order the test to be done at this facility if she likes-- just have Dr Redmond Baseman' office send a copy or call office to schedule Please contact vascular scheduler- fax [phone]. She verbalized understanding.  Kim also states she received a letter stating it is time for a June appointment.RN Tabitha ;Remigio Eisenmenger will be glad to make an appointment but schedule is out  Until AUG/SEPT. Will contact if any cancellations Kim voiced understanding.

## 2014-09-06 NOTE — Telephone Encounter (Signed)
Pt is c/o pain in her neck going up to her ear. Dr Redmond Baseman (ENT doctor) states that she may need a carotid doppler. The patient would like for Dr. Ellyn Hack to take care of this and order the carotid.  Please advise

## 2014-09-09 ENCOUNTER — Encounter: Payer: Self-pay | Admitting: Cardiology

## 2014-09-09 ENCOUNTER — Ambulatory Visit (INDEPENDENT_AMBULATORY_CARE_PROVIDER_SITE_OTHER): Payer: Federal, State, Local not specified - PPO | Admitting: Cardiology

## 2014-09-09 VITALS — BP 98/70 | HR 81 | Ht 68.0 in | Wt 111.0 lb

## 2014-09-09 DIAGNOSIS — Z720 Tobacco use: Secondary | ICD-10-CM

## 2014-09-09 DIAGNOSIS — Z87898 Personal history of other specified conditions: Secondary | ICD-10-CM

## 2014-09-09 DIAGNOSIS — R55 Syncope and collapse: Secondary | ICD-10-CM

## 2014-09-09 DIAGNOSIS — M542 Cervicalgia: Secondary | ICD-10-CM | POA: Diagnosis not present

## 2014-09-09 DIAGNOSIS — H8110 Benign paroxysmal vertigo, unspecified ear: Secondary | ICD-10-CM

## 2014-09-09 DIAGNOSIS — R42 Dizziness and giddiness: Secondary | ICD-10-CM | POA: Diagnosis not present

## 2014-09-09 DIAGNOSIS — Z950 Presence of cardiac pacemaker: Secondary | ICD-10-CM | POA: Diagnosis not present

## 2014-09-09 DIAGNOSIS — Z9189 Other specified personal risk factors, not elsewhere classified: Secondary | ICD-10-CM

## 2014-09-09 NOTE — Progress Notes (Signed)
PCP: Sherian Maroon, MD  Clinic Note: Chief Complaint  Patient presents with  . Annual Exam    Patient has had SOB and swelling in her ankles.  . Neck Pain  . Loss of Consciousness    Status post pacemaker placement    HPI: [name] is a 59 y.o. female with a PMH below who presents today for evaluation of pain along the right neck. She is the patient had been following for vertigo and syncope type symptoms. She probably in the being diagnosed with bradycardia was symptomatic with sinus pauses leading to worsened today. She underwent pacemaker placement, and is being followed by Dr. Sallyanne Kuster.  She is also troubled by bouts of significant vertigo.  She last saw Dr. Sallyanne Kuster back in March, and only noted having had a panic attack earlier in the year but no recurrent syncope. She has had musculoskeletal chest pain has been intermittent for her. She also has had rare and brief episodes of nonsustained VT. I actually have not seen her since June of 2015.  At that time was discussed the fact that her daughter had a stillborn child with DMP mutation for ARVC (this is quite interesting given that her husband just recently died, presumably from cardiac arrest after having been found in his car. He gone missing for 2 days, and was found dead in his car.    She has had anterior cervical spine surgery as well as lumbar spine fusion surgery. The anterior cervical surgery as but to be significant recurrent laryngeal nerve damage with dysphasia and pain along the right neck. She was referred here for evaluation for possible carotid bruit.  Most notable issue is that she is not able to swallow very well, which has led to a relatively significant weight loss which has been that of fatigue and exercise intolerance.  Past Medical History  Diagnosis Date  . Syncope and collapse     echo-April 12,2012-EF 55% Echo normal; recorder with prolonged sinus pauses --> status post  . Anxiety     anxiety  and mild depressive order systoms  . Vertigo   . Tobacco abuse   . Sinus pause 02/14/2013  . OXBDZHGD(924.2)     "monthly" (02/20/2013)  . Bipolar disorder      (02/20/2013)  . PONV (postoperative nausea and vomiting)   . Pacemaker     medtronic  . Dysrhythmia     Prior Cardiac Evaluation and Past Surgical History: Past Surgical History  Procedure Laterality Date  . Transthoracic echocardiogram  07/11/2010    The left atrial size is normal.There is no evidence of mitral vavle prolaspe.Right ventriicular systolic pressure is normal . Injection of contrast documented no interatrial shunt. Essentially normal 2D echo -doppler study   . Abdominal hysterectomy  03/19/1980    "partial"  . Insert / replace / remove pacemaker  02/20/2013    medtronic  . Posterior lumbar fusion  ~ 2009  . Back surgery    . Cervical fusion Left 11/2013    C4-C6  . Loop recorder implant N/A 11/06/2012    Procedure: LINQ LOOP RECORDER IMPLANT;  Surgeon: Sanda Klein, MD;  Location: [facility];  Service: Cardiovascular;  Laterality: N/A;  . Permanent pacemaker insertion N/A 02/20/2013    Procedure: PERMANENT PACEMAKER INSERTION;  Surgeon: Sanda Klein, MD;  Location: [facility];  Service: Cardiovascular;  Laterality: N/A;  . Cervical fusion      3,4,5,    Interval History: cardiac standpoint, she notes feeling tired  and fatigued as indicated above. Has not really had much in the way of any rapid irregular heartbeat/palpitations. She does have vertigo which seems felt to be positional, but denies any syncope or near-syncope. No heartburn or symptoms of PND, orthopnea, but does have some lower surgery edema that may very well relate to her poor nutrition. No chest pressure with rest or exertion besides the intermittent twinges that she gets off and on. She gets diffuse muscle spasms, and takes when necessary Robaxin.sometimes she has to use Percocet  No TIA/amaurosis fugax symptoms. No melena, hematochezia,  hematuria, or epstaxis. No claudication.  ROS: A comprehensive was performed. Review of Systems  Constitutional: Positive for malaise/fatigue. Negative for weight loss.  Respiratory: Positive for shortness of breath (Intermittent).   Gastrointestinal: Positive for heartburn. Negative for blood in stool and melena.       Dysphagia related to her recent neck surgery, unable to swallow and therefore decreased by mouth intake.  Genitourinary: Negative for hematuria.  Musculoskeletal:       Muscle cramps  Neurological: Positive for weakness.  Endo/Heme/Allergies: Negative.   Psychiatric/Behavioral: Positive for depression (still in shock about the loss of her husband).  All other systems reviewed and are negative.   Current Outpatient Prescriptions on File Prior to Visit  Medication Sig Dispense Refill  . ALPRAZolam (XANAX) 0.5 MG tablet Take 1 mg by mouth 2 (two) times daily as needed.     . Armodafinil 150 MG tablet Take 150 mg by mouth daily.    . methocarbamol (ROBAXIN) 500 MG tablet Take 1 tablet (500 mg total) by mouth 3 (three) times daily as needed for muscle spasms. 60 tablet 0  . mirtazapine (REMERON) 30 MG tablet Take 30 mg by mouth at bedtime.    . naproxen (NAPROSYN) 500 MG tablet Take 500 mg by mouth as needed.    . ondansetron (ZOFRAN) 4 MG tablet Take 1 tablet (4 mg total) by mouth every 8 (eight) hours as needed for nausea or vomiting. 20 tablet 0  . oxyCODONE-acetaminophen (PERCOCET) 10-325 MG per tablet Take 1 tablet by mouth every 4 (four) hours as needed for pain. 60 tablet 0  . pantoprazole (PROTONIX) 40 MG tablet Take 1 tablet by mouth daily.    . sertraline (ZOLOFT) 100 MG tablet Take 100 mg by mouth daily.    . SUMAtriptan (IMITREX) 100 MG tablet Take 100 mg by mouth every 2 (two) hours as needed for migraine or headache. May repeat in 2 hours if headache persists or recurs.    Marland Kitchen tiZANidine (ZANAFLEX) 4 MG tablet Take 1 tablet by mouth as needed.  1   No current  facility-administered medications on file prior to visit.   Allergies  Allergen Reactions  . Prednisone     Bladder infections  . Penicillins Other (See Comments)    Yeast infection    History  Substance Use Topics  . Smoking status: Current Every Day Smoker -- 1.00 packs/day for 40 years    Types: Cigarettes  . Smokeless tobacco: Never Used  . Alcohol Use: No   Husband recently died of MI 09-11-2022.  Died in his car - never made it to work.  family history includes Heart attack in her brother. She was adopted.   Wt Readings from Last 3 Encounters:  09/09/14 50.349 kg (111 lb)  06/15/14 53.706 kg (118 lb 6.4 oz)  04/15/14 54.522 kg (120 lb 3.2 oz)    PHYSICAL EXAM BP 98/70 mmHg  Pulse 81  Ht 5\' 8"  (1.727 m)  Wt 50.349 kg (111 lb)  BMI 16.88 kg/m2 General appearance: alert, cooperative, appears stated age, no distress; pleasant mood and affect  Neck: no adenopathy, no carotid bruit and no JVD Lungs: clear to auscultation bilaterally, normal percussion bilaterally and non-labored Heart: RRR, normal S1 and S2, no M./R./G. nondisplaced PMI  Abdomen: soft, non-tender; bowel sounds normal; no masses, no organomegaly;  Extremities: extremities normal, atraumatic, no cyanosis or edema  Pulses: 2+ and symmetric Neurologic: Mental status: Alert, oriented, thought content appropriate Cranial nerves: normal (II-XII grossly intact)   Adult ECG Report  Rate: 81 ;  Rhythm: normal sinus rhythm and Possible mild left atrial enlargement. Otherwise normal axis, intervals and durations.  Narrative Interpretation: essentially normal EKG  Other studies Reviewed: Additional studies/ records that were reviewed today include: n/a Review of the above records demonstrates: no new studies Recent Labs:    No results found for: CHOL, HDL, LDLCALC, LDLDIRECT, TRIG, CHOLHDL   ASSESSMENT / PLAN:  Overall, I don't think that the neck pain issues dating cardiovascular nature. Probably more  related to recent surgery. I would not make any changes to her medications and she is not really on any cardiac medications.  Problem List Items Addressed This Visit    BENIGN POSITIONAL VERTIGO    Relatively stable. She is not on meclizine. More likely to be dizzy from borderline malnutrition at this point. We talked about the importance of adequate hydration and nutrition initially unable swallow, she needs to use dietary supplementation.      Hx of syncope- s/p Loop recorder 11/06/12 (Chronic)   Near syncope    No further episodes of near syncope. Rhythm is monitor along with the pacemaker.      Relevant Orders   EKG 12-Lead (Completed)   Carotid   Neck pain on right side    Did not hear a bruit, but she was very tender to palpation along the right sternocleidomastoid. My suspicion is that she may very well have some inflammation resulting from her surgery. Most likely not related to carotid artery disease as that is not typically painful.  Not wanting to discount my colleague's examination, it is not unreasonable to check carotid Dopplers to ensure that there is no obstructive lesions or edema.      Relevant Orders   EKG 12-Lead (Completed)   Carotid   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 - Primary (Chronic)   Relevant Orders   EKG 12-Lead (Completed)   Carotid   Tobacco abuse (Chronic)    We discussed is well positioned counseling. She continued to be non-contemplative state. She is very reluctant to make any changes right now given that she is in the morning for the loss of her husband.      Vertigo (Chronic)   Relevant Orders   EKG 12-Lead (Completed)   Carotid      Current medicines are reviewed at length with the patient today. (+/- concerns) No medication concerns - is concerned about R neck discomfort. The following changes have been made: n/a labs/ tests ordered today include:   Orders Placed This Encounter  Procedures  . EKG 12-Lead    Meds ordered this encounter  Medications  . omeprazole (PRILOSEC) 40 MG capsule    Sig: Take 40 mg by mouth 2 (two) times daily.    Refill:  5  . LYRICA 75 MG capsule    Sig: Take 75 mg by mouth at bedtime.    Refill:  0  . ABILIFY 15 MG tablet    Sig: Take 15 mg by mouth daily.    Refill:  1  . ibuprofen (ADVIL,MOTRIN) 600 MG tablet    Sig: TAKE 1 TABLET BY MOUTH 3 TIMES DAILY WITH MEALS    Refill:  0  . risperiDONE (RISPERDAL) 1 MG tablet    Sig: Take 1 mg by mouth at bedtime.  . cyclobenzaprine (FLEXERIL) 10 MG tablet    Sig: Take 10 mg by mouth 3 (three) times daily as needed for muscle spasms.     Followup: one year    Burnard Enis, Leonie Green, M.D., M.S. Interventional Cardiologist   Pager # [phone]

## 2014-09-09 NOTE — Patient Instructions (Signed)
Your physician has requested that you have a carotid duplex. This test is an ultrasound of the carotid arteries in your neck. It looks at blood flow through these arteries that supply the brain with blood. Allow one hour for this exam. There are no restrictions or special instructions.    No change with current medications.   Your physician wants you to follow-up in 12 months Dr Ellyn Hack.  You will receive a reminder letter in the mail two months in advance. If you don't receive a letter, please call our office to schedule the follow-up appointment.

## 2014-09-10 ENCOUNTER — Encounter: Payer: Self-pay | Admitting: Cardiology

## 2014-09-10 NOTE — Assessment & Plan Note (Signed)
Did not hear a bruit, but she was very tender to palpation along the right sternocleidomastoid. My suspicion is that she may very well have some inflammation resulting from her surgery. Most likely not related to carotid artery disease as that is not typically painful.  Not wanting to discount my colleague's examination, it is not unreasonable to check carotid Dopplers to ensure that there is no obstructive lesions or edema.

## 2014-09-10 NOTE — Assessment & Plan Note (Signed)
Relatively stable. She is not on meclizine. More likely to be dizzy from borderline malnutrition at this point. We talked about the importance of adequate hydration and nutrition initially unable swallow, she needs to use dietary supplementation.

## 2014-09-10 NOTE — Assessment & Plan Note (Signed)
No further episodes of near syncope. Rhythm is monitor along with the pacemaker.

## 2014-09-10 NOTE — Assessment & Plan Note (Signed)
We discussed is well positioned counseling. She continued to be non-contemplative state. She is very reluctant to make any changes right now given that she is in the morning for the loss of her husband.

## 2014-09-14 ENCOUNTER — Telehealth: Payer: Self-pay | Admitting: Cardiology

## 2014-09-14 ENCOUNTER — Encounter: Payer: Self-pay | Admitting: Cardiovascular Disease

## 2014-09-14 ENCOUNTER — Ambulatory Visit (INDEPENDENT_AMBULATORY_CARE_PROVIDER_SITE_OTHER): Payer: Federal, State, Local not specified - PPO | Admitting: *Deleted

## 2014-09-14 DIAGNOSIS — I455 Other specified heart block: Secondary | ICD-10-CM

## 2014-09-14 NOTE — Progress Notes (Signed)
Remote pacemaker transmission.   

## 2014-09-14 NOTE — Telephone Encounter (Signed)
Spoke with pt and reminded pt of remote transmission that is due today. Pt verbalized understanding.   

## 2014-09-16 LAB — CUP PACEART REMOTE DEVICE CHECK
Battery Impedance: 135 Ohm
Battery Remaining Longevity: 162 mo
Battery Voltage: 2.79 V
Brady Statistic AP VP Percent: 0 %
Brady Statistic AP VS Percent: 0 %
Brady Statistic AS VP Percent: 0 %
Brady Statistic AS VS Percent: 100 %
Date Time Interrogation Session: 20160628135008
Lead Channel Impedance Value: 416 Ohm
Lead Channel Impedance Value: 471 Ohm
Lead Channel Pacing Threshold Amplitude: 0.375 V
Lead Channel Pacing Threshold Amplitude: 1.5 V
Lead Channel Pacing Threshold Pulse Width: 0.4 ms
Lead Channel Pacing Threshold Pulse Width: 0.4 ms
Lead Channel Sensing Intrinsic Amplitude: 0.7 mV
Lead Channel Sensing Intrinsic Amplitude: 5.6 mV
Lead Channel Setting Pacing Amplitude: 2 V
Lead Channel Setting Pacing Amplitude: 3 V
Lead Channel Setting Pacing Pulse Width: 0.4 ms
Lead Channel Setting Sensing Sensitivity: 2 mV

## 2014-09-21 ENCOUNTER — Ambulatory Visit (HOSPITAL_COMMUNITY)
Admission: RE | Admit: 2014-09-21 | Discharge: 2014-09-21 | Disposition: A | Discharge status: Home / Self Care | Payer: Federal, State, Local not specified - PPO | Source: Ambulatory Visit | Attending: Cardiovascular Disease | Admitting: Cardiovascular Disease

## 2014-09-21 DIAGNOSIS — R55 Syncope and collapse: Secondary | ICD-10-CM | POA: Insufficient documentation

## 2014-09-21 DIAGNOSIS — Z950 Presence of cardiac pacemaker: Secondary | ICD-10-CM | POA: Diagnosis not present

## 2014-09-21 DIAGNOSIS — M542 Cervicalgia: Secondary | ICD-10-CM

## 2014-09-21 DIAGNOSIS — R42 Dizziness and giddiness: Secondary | ICD-10-CM | POA: Insufficient documentation

## 2014-09-22 ENCOUNTER — Telehealth: Payer: Self-pay | Admitting: *Deleted

## 2014-09-22 NOTE — Telephone Encounter (Signed)
Spoke to patient. Result given . Verbalized understanding  

## 2014-09-22 NOTE — Telephone Encounter (Signed)
-----   Message from Leonie Man, MD sent at 09/22/2014 12:13 AM EDT ----- Normal carotid Dopplers. Normal flow noted in both carotid and vertebral arteries bilaterally.  Leonie Man, MD

## 2014-10-04 ENCOUNTER — Encounter: Payer: Self-pay | Admitting: Cardiology

## 2014-12-10 IMAGING — CT CT CERVICAL SPINE W/ CM
4 of 5 series · 9 of 20 positions shown, 10 images · non-contrast
Comparison: none

CLINICAL DATA: Cervical disc disorder. Fall May 2013. Severe
constant neck pain radiating into both shoulders. LEFT greater than
RIGHT upper extremity radiculopathy. Failure of conservative
measures including oral steroid therapy and neuromodulating.
TECHNIQUE: Contiguous axial images were obtained through the Cervical spine
after the intrathecal infusion of infusion. Coronal and sagittal
reconstructions were obtained of the axial image sets.

[Series 2: c spine bone · axial · 0.27mm/px · z∈[+127,+222]mm · 3 of 78 slices shown, 4 images]
[im 20/78  soft-tissue]
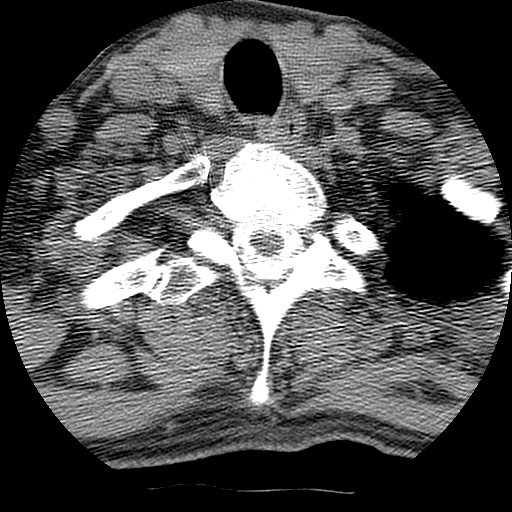
[im 20/78  bone]
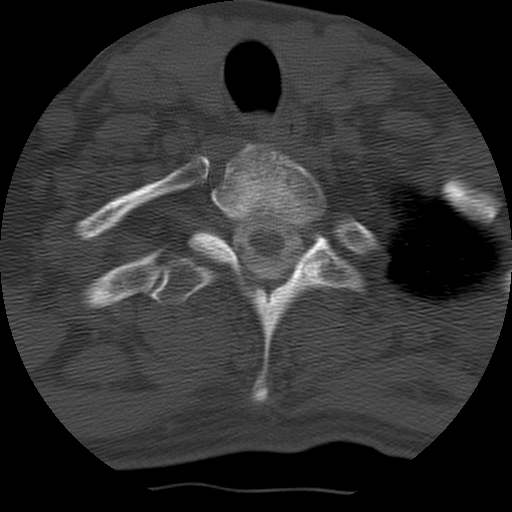
[im 39/78  bone]
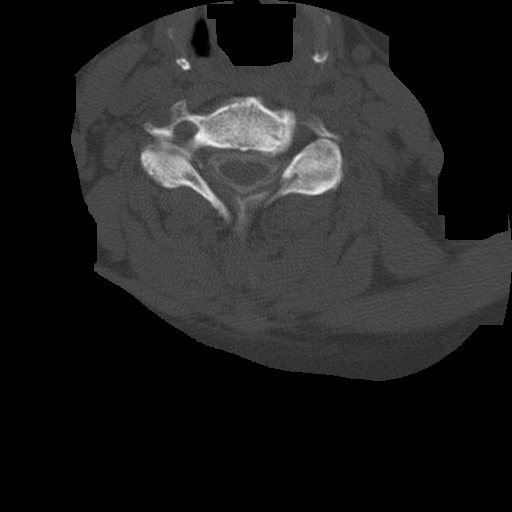
[im 58/78  bone]
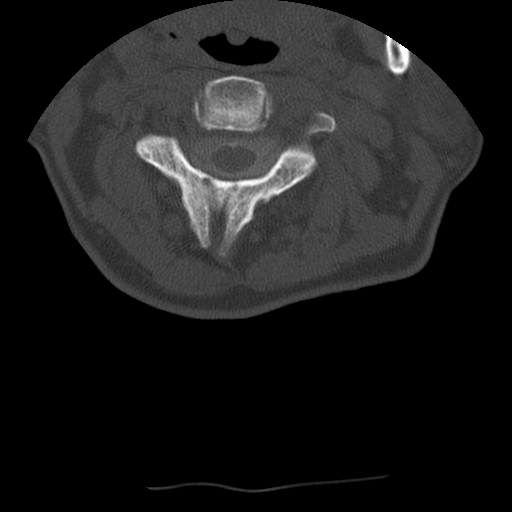

[Series 3: c spine soft · axial · 0.27mm/px · z∈[+142,+207]mm · 2 of 78 slices shown]
[im 26/78  soft-tissue]
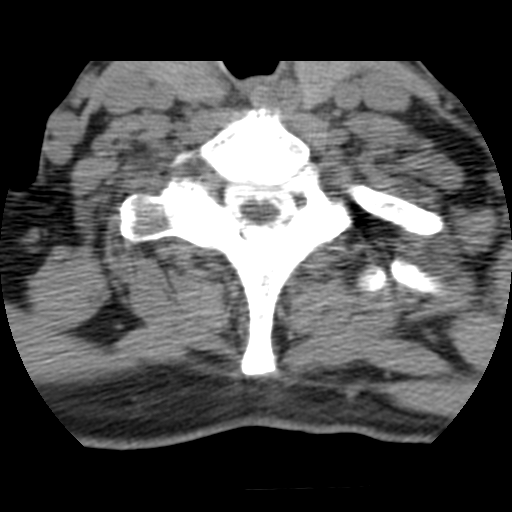
[im 52/78  soft-tissue]
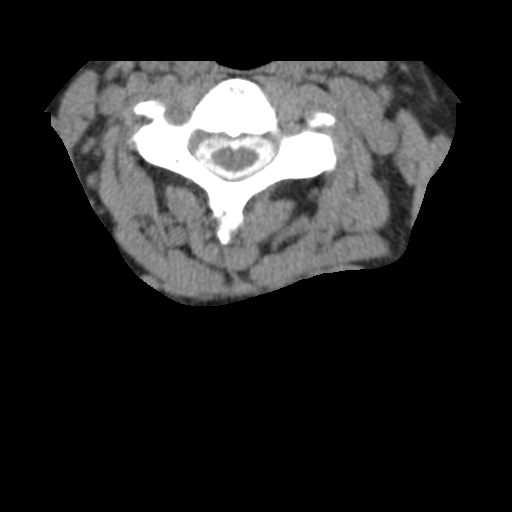

[Series 400: cor · coronal · 0.38mm/px · 1 of 44 slices shown]
[im 22/44  bone]
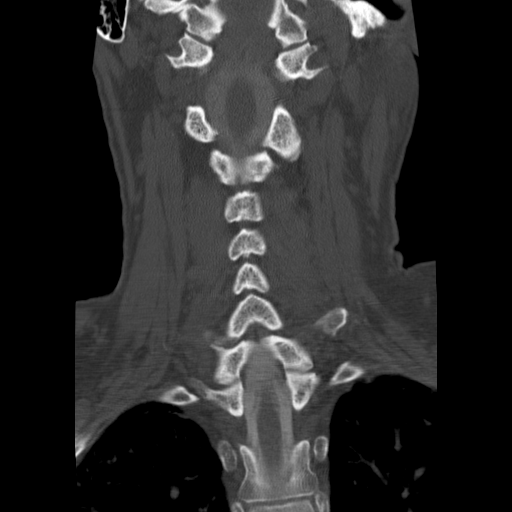

[Series 402: axial · axial · 0.23mm/px · z∈[+115,+211]mm · 3 of 99 slices shown]
[im 25/99  bone]
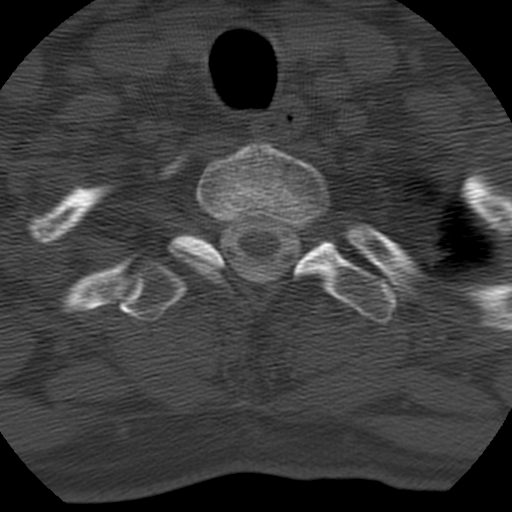
[im 50/99  bone]
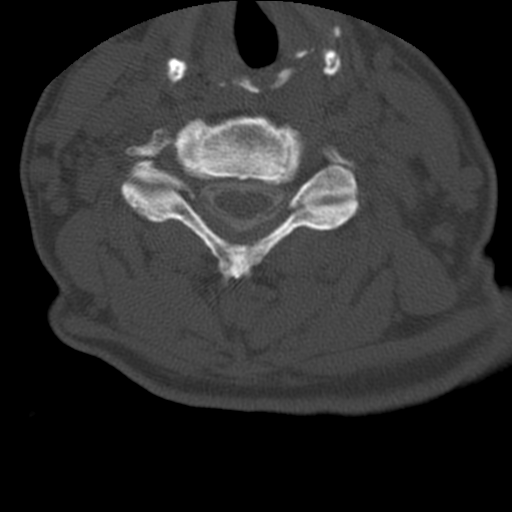
[im 74/99  bone]
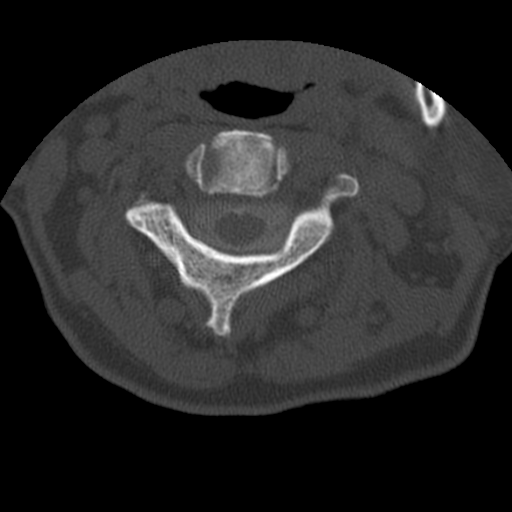

[9 of 20 positions shown; findings below may reference images not displayed]

FLUOROSCOPY TIME:  1 min 9 seconds

PROCEDURE:
LUMBAR PUNCTURE FOR CERVICAL MYELOGRAM

After thorough discussion of risks and benefits of the procedure
including bleeding, infection, injury to nerves, blood vessels,
adjacent structures as well as headache and CSF leak, written and
oral informed consent was obtained. Consent was obtained by Dr.
Brandon Wolde. We discussed the high likelihood of obtaining a
diagnostic study.

Patient was positioned prone on the fluoroscopy table. Local
anesthesia was provided with 1% lidocaine without epinephrine after
prepped and draped in the usual sterile fashion. Puncture was
performed at L3-L4 using a 3 1/2 inch 22 gauge pencil point Serena
spinal needle via RIGHT paramedian approach. Using a single pass
through the dura, the needle was placed within the thecal sac, with
return of clear CSF. 10 mL of Wmnipaque-5SS was injected into the
thecal sac, with normal opacification of the nerve roots and cauda
equina consistent with free flow within the subarachnoid space. The
patient was then moved to the trendelenburg position and contrast
flowed into the Cervical spine region.

I personally performed the lumbar puncture and administered the
intrathecal contrast. I also personally supervised acquisition of
the myelogram images.
FINDINGS: CERVICAL MYELOGRAM FINDINGS:

Alignment on the frontal view is normal. Multilevel cervical facet
arthrosis is present, greater on the RIGHT than LEFT. Disc
osteophyte complexes indent the ventral thecal sac at C4-C5, C5-C6
and C6-C7. Retrolisthesis of C5 on C6 measures 2 mm. 1 mm
anterolisthesis of C4 on C5.

CT CERVICAL MYELOGRAM FINDINGS:

Alignment: Again, 2 mm of retrolisthesis of C5 on C6 due to collapse
of the disc space posteriorly. Mild dextroconvex torticollis is
present some low which may be positional.

Vertebrae: No destructive osseous lesions. Degenerative endplate
changes in the superior aspect of C6.

Cord: Normal.

Posterior Fossa: Visualized portions are unremarkable.

Vertebral Arteries: Grossly normal.

Paraspinal tissues: Partially visualized LEFT subclavian pacemaker
leads. Atelectasis in the lungs.

Disc levels:

C2-C3:  Negative.

C3-C4:  LEFT facet arthrosis.  Central canal and foramina patent.

C4-C5: 1 mm anterolisthesis which appears degenerative and facet
mediated. Central canal appears patent. Minimal disc bulging. No
cord deformity. Both neural foramina appear patent. Moderate LEFT
facet arthrosis. RIGHT facet joint appears normal.

C5-C6: Severe disc degeneration. Loss of disc height anterior
annular calcification. Mild central stenosis. AP diameter of the
thecal sac is 9 mm. Retrolisthesis and broad-based disc osteophyte
complex contribute to the central stenosis anteriorly. Minimal
ligamentum flavum redundancy. There is bilateral RIGHT greater than
LEFT foraminal stenosis due to uncovertebral spurring and
retrolisthesis potentially affecting RIGHT greater than LEFT C6
nerves.

C6-C7: Disc degeneration with mild osteophytic spurring. Central
canal and foramina appear patent.

C7-T1: Moderate RIGHT facet arthrosis. Central canal and LEFT neural
foramen appear patent. Anterior facet spurring produces mild RIGHT
foraminal stenosis potentially affecting the RIGHT C8 nerve.

Upper thoracic levels appear within normal limits.
IMPRESSION: 1. Technically successful lumbar puncture for cervical myelogram.
2. C5-C6 mild central stenosis with degenerative disc disease. 2 mm
of retrolisthesis with bilateral RIGHT greater than LEFT C5-C6
foraminal stenosis.
3. C4-C5 1 mm anterolisthesis appears degenerative and facet
mediated. No stenosis.
4. C7-T1 RIGHT facet arthrosis with mild RIGHT foraminal stenosis
potentially affecting the RIGHT C8 nerve.

## 2014-12-10 IMAGING — RF DG MYELOGRAPHY LUMBAR INJ CERVICAL
10 series · 10 of 10 positions shown · non-contrast
Comparison: none

CLINICAL DATA: Cervical disc disorder. Fall May 2013. Severe
constant neck pain radiating into both shoulders. LEFT greater than
RIGHT upper extremity radiculopathy. Failure of conservative
measures including oral steroid therapy and neuromodulating.
TECHNIQUE: Contiguous axial images were obtained through the Cervical spine
after the intrathecal infusion of infusion. Coronal and sagittal
reconstructions were obtained of the axial image sets.

[Series 1: (hospital) · 1 of 1 slices shown (1 of 2)]
[im 1/1]
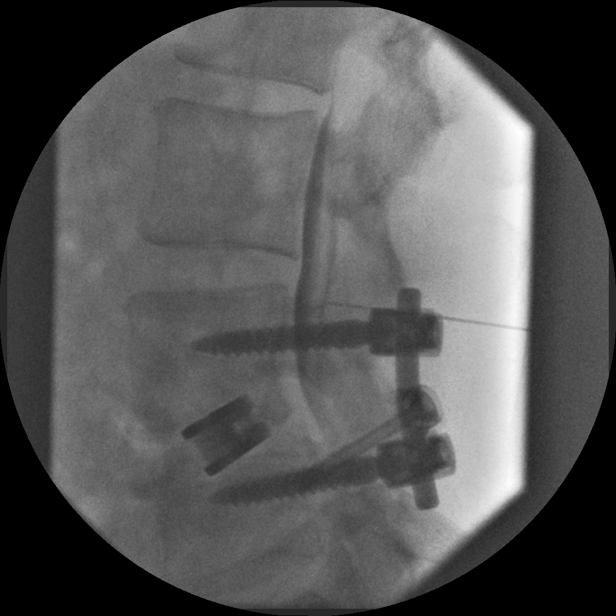

[Series 2: (hospital) · 1 of 1 slices shown (2 of 2)]
[im 1/1]
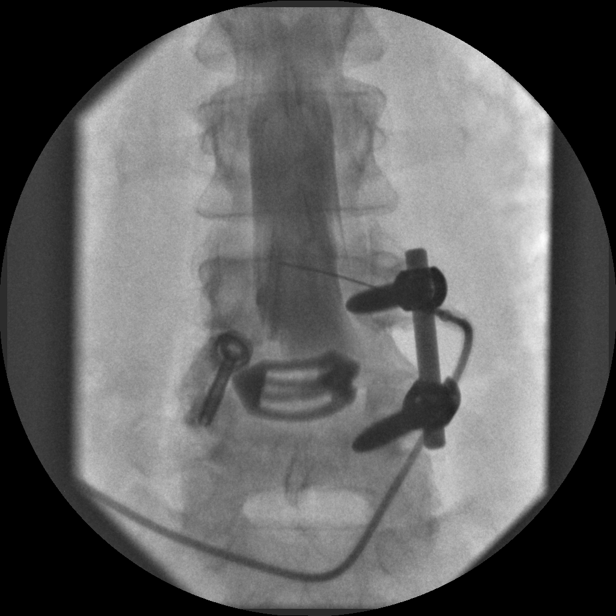

[Series 3: myelogram  white · 1 of 1 slices shown (1 of 8)]
[im 1/1]
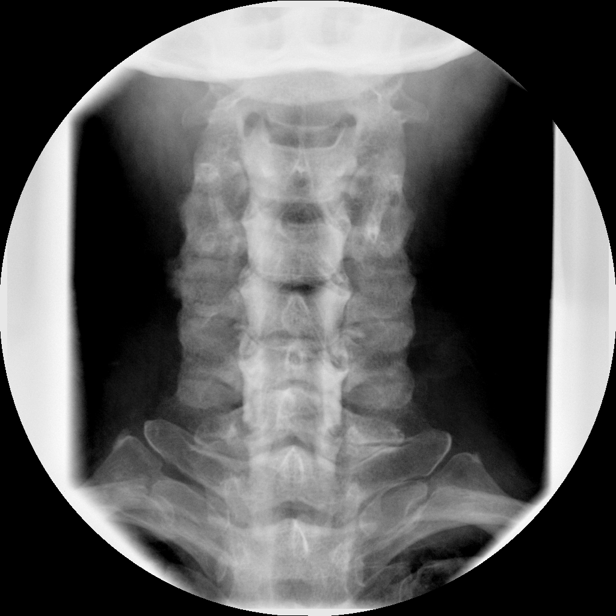

[Series 4: myelogram  white · 1 of 1 slices shown (2 of 8)]
[im 1/1]
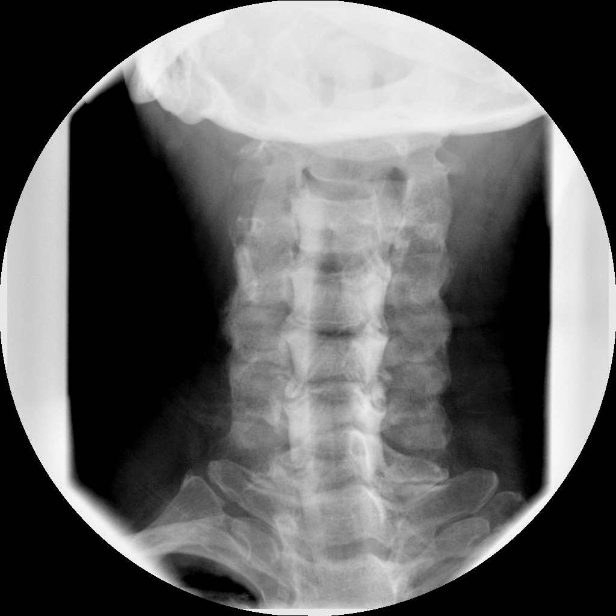

[Series 5: myelogram  white · 1 of 1 slices shown (3 of 8)]
[im 1/1]
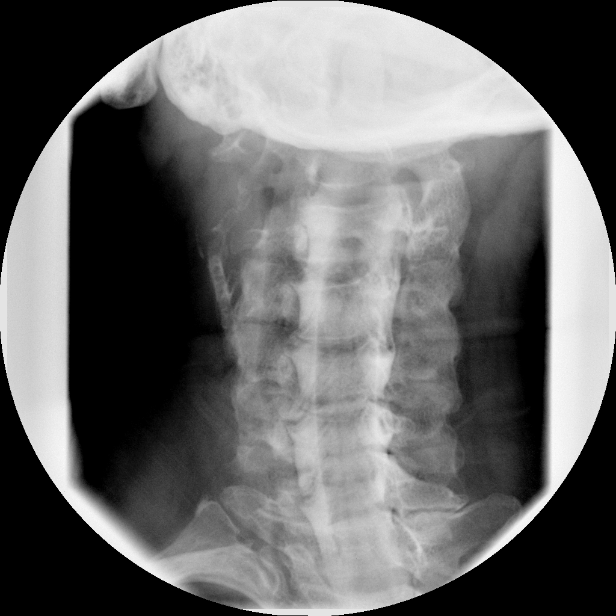

[Series 6: myelogram  white · 1 of 1 slices shown (4 of 8)]
[im 1/1]
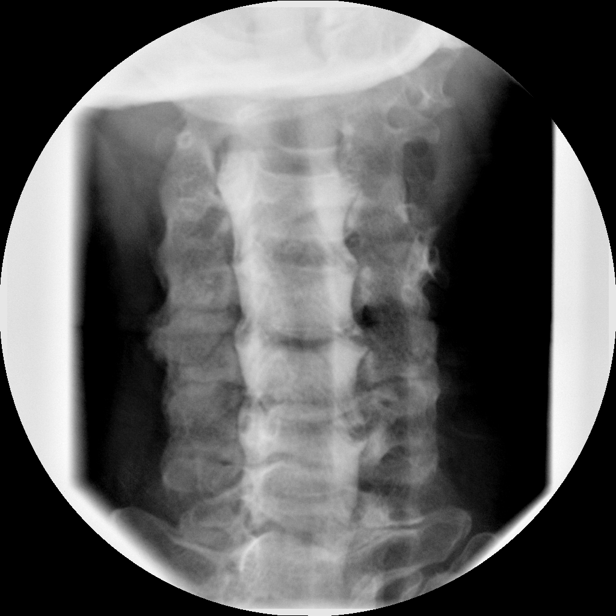

[Series 7: myelogram  white · 1 of 1 slices shown (5 of 8)]
[im 1/1]
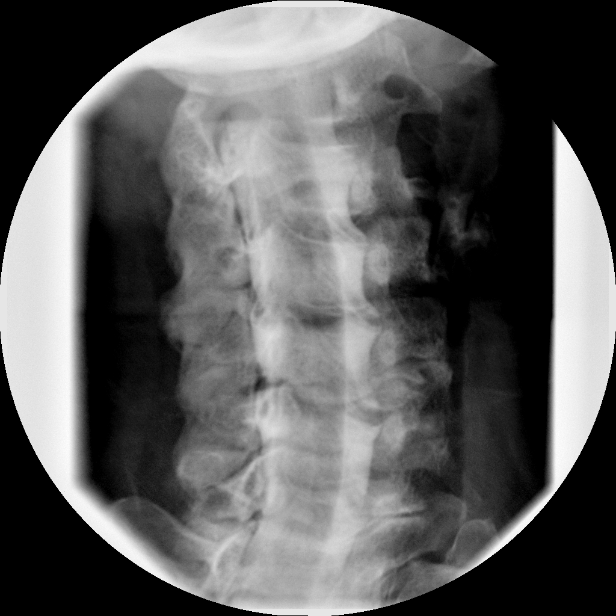

[Series 8: myelogram  white · 1 of 1 slices shown (6 of 8)]
[im 1/1]
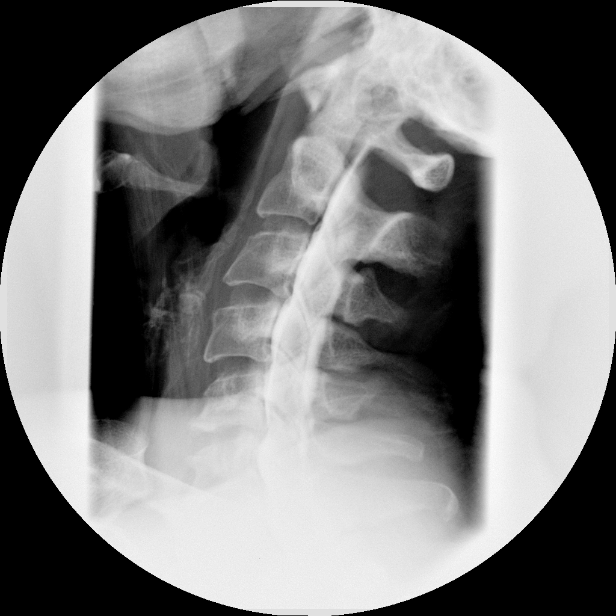

[Series 9: myelogram  white · 1 of 1 slices shown (7 of 8)]
[im 1/1]
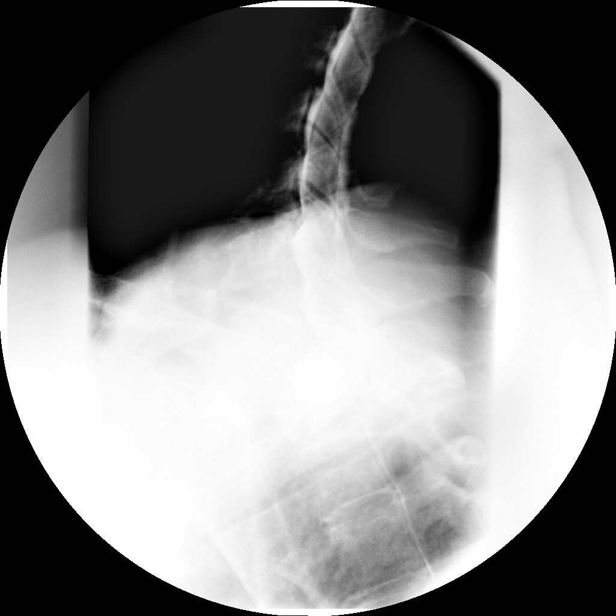

[Series 10: myelogram  white · 1 of 1 slices shown (8 of 8)]
[im 1/1]
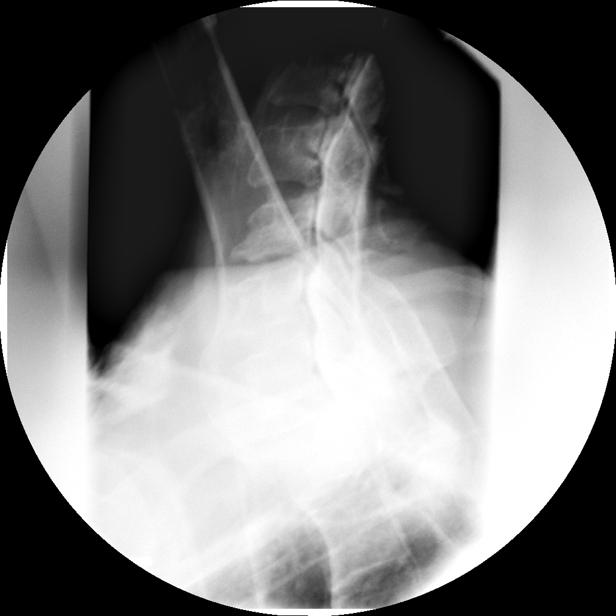

[10 of 10 positions shown; findings below may reference images not displayed]

FLUOROSCOPY TIME:  1 min 9 seconds

PROCEDURE:
LUMBAR PUNCTURE FOR CERVICAL MYELOGRAM

After thorough discussion of risks and benefits of the procedure
including bleeding, infection, injury to nerves, blood vessels,
adjacent structures as well as headache and CSF leak, written and
oral informed consent was obtained. Consent was obtained by Dr.
Brandon Wolde. We discussed the high likelihood of obtaining a
diagnostic study.

Patient was positioned prone on the fluoroscopy table. Local
anesthesia was provided with 1% lidocaine without epinephrine after
prepped and draped in the usual sterile fashion. Puncture was
performed at L3-L4 using a 3 1/2 inch 22 gauge pencil point Serena
spinal needle via RIGHT paramedian approach. Using a single pass
through the dura, the needle was placed within the thecal sac, with
return of clear CSF. 10 mL of Wmnipaque-5SS was injected into the
thecal sac, with normal opacification of the nerve roots and cauda
equina consistent with free flow within the subarachnoid space. The
patient was then moved to the trendelenburg position and contrast
flowed into the Cervical spine region.

I personally performed the lumbar puncture and administered the
intrathecal contrast. I also personally supervised acquisition of
the myelogram images.
FINDINGS: CERVICAL MYELOGRAM FINDINGS:

Alignment on the frontal view is normal. Multilevel cervical facet
arthrosis is present, greater on the RIGHT than LEFT. Disc
osteophyte complexes indent the ventral thecal sac at C4-C5, C5-C6
and C6-C7. Retrolisthesis of C5 on C6 measures 2 mm. 1 mm
anterolisthesis of C4 on C5.

CT CERVICAL MYELOGRAM FINDINGS:

Alignment: Again, 2 mm of retrolisthesis of C5 on C6 due to collapse
of the disc space posteriorly. Mild dextroconvex torticollis is
present some low which may be positional.

Vertebrae: No destructive osseous lesions. Degenerative endplate
changes in the superior aspect of C6.

Cord: Normal.

Posterior Fossa: Visualized portions are unremarkable.

Vertebral Arteries: Grossly normal.

Paraspinal tissues: Partially visualized LEFT subclavian pacemaker
leads. Atelectasis in the lungs.

Disc levels:

C2-C3:  Negative.

C3-C4:  LEFT facet arthrosis.  Central canal and foramina patent.

C4-C5: 1 mm anterolisthesis which appears degenerative and facet
mediated. Central canal appears patent. Minimal disc bulging. No
cord deformity. Both neural foramina appear patent. Moderate LEFT
facet arthrosis. RIGHT facet joint appears normal.

C5-C6: Severe disc degeneration. Loss of disc height anterior
annular calcification. Mild central stenosis. AP diameter of the
thecal sac is 9 mm. Retrolisthesis and broad-based disc osteophyte
complex contribute to the central stenosis anteriorly. Minimal
ligamentum flavum redundancy. There is bilateral RIGHT greater than
LEFT foraminal stenosis due to uncovertebral spurring and
retrolisthesis potentially affecting RIGHT greater than LEFT C6
nerves.

C6-C7: Disc degeneration with mild osteophytic spurring. Central
canal and foramina appear patent.

C7-T1: Moderate RIGHT facet arthrosis. Central canal and LEFT neural
foramen appear patent. Anterior facet spurring produces mild RIGHT
foraminal stenosis potentially affecting the RIGHT C8 nerve.

Upper thoracic levels appear within normal limits.
IMPRESSION: 1. Technically successful lumbar puncture for cervical myelogram.
2. C5-C6 mild central stenosis with degenerative disc disease. 2 mm
of retrolisthesis with bilateral RIGHT greater than LEFT C5-C6
foraminal stenosis.
3. C4-C5 1 mm anterolisthesis appears degenerative and facet
mediated. No stenosis.
4. C7-T1 RIGHT facet arthrosis with mild RIGHT foraminal stenosis
potentially affecting the RIGHT C8 nerve.

## 2014-12-14 ENCOUNTER — Ambulatory Visit (INDEPENDENT_AMBULATORY_CARE_PROVIDER_SITE_OTHER): Payer: Federal, State, Local not specified - PPO | Admitting: *Deleted

## 2014-12-14 ENCOUNTER — Encounter: Payer: Self-pay | Admitting: Cardiovascular Disease

## 2014-12-14 DIAGNOSIS — I455 Other specified heart block: Secondary | ICD-10-CM

## 2014-12-14 NOTE — Progress Notes (Signed)
Remote pacemaker transmission.   

## 2014-12-20 LAB — CUP PACEART REMOTE DEVICE CHECK
Battery Impedance: 135 Ohm
Battery Remaining Longevity: 164 mo
Battery Voltage: 2.79 V
Brady Statistic AP VP Percent: 0 %
Brady Statistic AP VS Percent: 0 %
Brady Statistic AS VP Percent: 1 %
Brady Statistic AS VS Percent: 99 %
Date Time Interrogation Session: 20160927113034
Lead Channel Impedance Value: 426 Ohm
Lead Channel Impedance Value: 530 Ohm
Lead Channel Pacing Threshold Amplitude: 0.375 V
Lead Channel Pacing Threshold Amplitude: 1.375 V
Lead Channel Pacing Threshold Pulse Width: 0.4 ms
Lead Channel Pacing Threshold Pulse Width: 0.4 ms
Lead Channel Sensing Intrinsic Amplitude: 0.7 mV — CL
Lead Channel Sensing Intrinsic Amplitude: 5.6 mV — CL
Lead Channel Setting Pacing Amplitude: 2 V
Lead Channel Setting Pacing Amplitude: 2.75 V
Lead Channel Setting Pacing Pulse Width: 0.4 ms
Lead Channel Setting Sensing Sensitivity: 2 mV

## 2014-12-30 ENCOUNTER — Encounter: Payer: Self-pay | Admitting: Cardiology

## 2015-01-18 ENCOUNTER — Other Ambulatory Visit: Payer: Self-pay | Admitting: Obstetrics and Gynecology

## 2015-01-18 ENCOUNTER — Other Ambulatory Visit: Payer: Self-pay | Admitting: Nurse Practitioner

## 2015-01-18 ENCOUNTER — Other Ambulatory Visit (HOSPITAL_COMMUNITY)
Admission: RE | Admit: 2015-01-18 | Discharge: 2015-01-18 | Disposition: A | Discharge status: Home / Self Care | Payer: Federal, State, Local not specified - PPO | Source: Ambulatory Visit | Attending: Nurse Practitioner | Admitting: Nurse Practitioner

## 2015-01-18 DIAGNOSIS — Z1231 Encounter for screening mammogram for malignant neoplasm of breast: Secondary | ICD-10-CM

## 2015-01-18 DIAGNOSIS — Z1151 Encounter for screening for human papillomavirus (HPV): Secondary | ICD-10-CM | POA: Insufficient documentation

## 2015-01-18 DIAGNOSIS — Z01419 Encounter for gynecological examination (general) (routine) without abnormal findings: Secondary | ICD-10-CM | POA: Diagnosis present

## 2015-01-21 LAB — CYTOLOGY - PAP

## 2015-02-02 ENCOUNTER — Ambulatory Visit: Payer: Federal, State, Local not specified - PPO

## 2015-03-15 ENCOUNTER — Telehealth: Payer: Self-pay | Admitting: Cardiology

## 2015-03-15 ENCOUNTER — Ambulatory Visit (INDEPENDENT_AMBULATORY_CARE_PROVIDER_SITE_OTHER): Payer: Federal, State, Local not specified - PPO | Admitting: *Deleted

## 2015-03-15 DIAGNOSIS — I455 Other specified heart block: Secondary | ICD-10-CM | POA: Diagnosis not present

## 2015-03-15 NOTE — Telephone Encounter (Signed)
Spoke with pt and reminded pt of remote transmission that is due today. Pt verbalized understanding.   

## 2015-03-16 NOTE — Progress Notes (Signed)
Remote pacemaker transmission.   

## 2015-03-17 ENCOUNTER — Encounter: Payer: Self-pay | Admitting: Cardiology

## 2015-03-17 LAB — CUP PACEART REMOTE DEVICE CHECK
Battery Impedance: 135 Ohm
Battery Remaining Longevity: 162 mo
Battery Voltage: 2.79 V
Brady Statistic AP VP Percent: 0 %
Brady Statistic AP VS Percent: 0 %
Brady Statistic AS VP Percent: 1 %
Brady Statistic AS VS Percent: 99 %
Date Time Interrogation Session: 20161228144159
Implantable Lead Implant Date: 20141205
Implantable Lead Implant Date: 20141205
Implantable Lead Location: 753859
Implantable Lead Location: 753860
Implantable Lead Model: 5076
Implantable Lead Model: 5076
Lead Channel Impedance Value: 404 Ohm
Lead Channel Impedance Value: 427 Ohm
Lead Channel Pacing Threshold Amplitude: 0.5 V
Lead Channel Pacing Threshold Amplitude: 1.375 V
Lead Channel Pacing Threshold Pulse Width: 0.4 ms
Lead Channel Pacing Threshold Pulse Width: 0.4 ms
Lead Channel Sensing Intrinsic Amplitude: 0.7 mV
Lead Channel Sensing Intrinsic Amplitude: 5.6 mV
Lead Channel Setting Pacing Amplitude: 2 V
Lead Channel Setting Pacing Amplitude: 2.75 V
Lead Channel Setting Pacing Pulse Width: 0.4 ms
Lead Channel Setting Sensing Sensitivity: 2 mV

## 2015-10-06 ENCOUNTER — Ambulatory Visit: Payer: Federal, State, Local not specified - PPO | Admitting: Cardiology

## 2015-12-09 ENCOUNTER — Ambulatory Visit (INDEPENDENT_AMBULATORY_CARE_PROVIDER_SITE_OTHER): Payer: Federal, State, Local not specified - PPO | Admitting: Cardiology

## 2015-12-09 ENCOUNTER — Encounter: Payer: Self-pay | Admitting: Cardiology

## 2015-12-09 VITALS — BP 111/79 | HR 84 | Ht 67.0 in | Wt 104.2 lb

## 2015-12-09 DIAGNOSIS — Z9189 Other specified personal risk factors, not elsewhere classified: Secondary | ICD-10-CM

## 2015-12-09 DIAGNOSIS — Z720 Tobacco use: Secondary | ICD-10-CM

## 2015-12-09 DIAGNOSIS — Z950 Presence of cardiac pacemaker: Secondary | ICD-10-CM

## 2015-12-09 DIAGNOSIS — Z87898 Personal history of other specified conditions: Secondary | ICD-10-CM

## 2015-12-09 DIAGNOSIS — R55 Syncope and collapse: Secondary | ICD-10-CM

## 2015-12-09 NOTE — Patient Instructions (Addendum)
NO CHANGES WITH CURRENT MEDICATIONS   Your physician wants you to follow-up [id number] WITH DR CROITORU.- MAY FOLLOW UP WITH DR CROITORU ONLY. You will receive a reminder letter in the mail two months in advance. If you don't receive a letter, please call our office to schedule the follow-up appointment.   If you need a refill on your cardiac medications before your next appointment, please call your pharmacy.

## 2015-12-09 NOTE — Progress Notes (Signed)
PCP: Sherian Maroon, MD  Clinic Note: Chief Complaint  Patient presents with  . Annual Exam    Pt states Sx or concerns.  . Loss of Consciousness    Symptomatically bradycardia with pauses. Status post pacemaker.    HPI: [name] is a 60 y.o. female with a PMH below who presents today for evaluation of pain along the right neck. She is the patient had been following for vertigo and syncope type symptoms. She probably in the being diagnosed with bradycardia was symptomatic with sinus pauses leading to worsened today. She underwent pacemaker placement, and is being followed by Dr. Sallyanne Kuster.  She is also troubled by bouts of significant vertigo.   I last saw her in June 2016, she has been doing remote checks of her pacemaker, has not seen Dr. Sallyanne Kuster in a while.  Past Medical History:  Diagnosis Date  . Anxiety    anxiety and mild depressive order systoms  . Bipolar disorder ([place])     (02/20/2013)  . Dysrhythmia   . [id number])    "monthly" (02/20/2013)  . Pacemaker    medtronic  . PONV (postoperative nausea and vomiting)   . Sinus pause 02/14/2013  . Syncope and collapse    echo-April 12,2012-EF 55% Echo normal; recorder with prolonged sinus pauses --> status post  . Tobacco abuse   . Vertigo     Prior Cardiac Evaluation and Past Surgical History: Past Surgical History:  Procedure Laterality Date  . ABDOMINAL HYSTERECTOMY  03/19/1980   "partial"  . BACK SURGERY    . CERVICAL FUSION Left 11/2013   C4-C6  . CERVICAL FUSION     3,4,5,  . INSERT / REPLACE / REMOVE PACEMAKER  02/20/2013   medtronic  . LOOP RECORDER IMPLANT N/A 11/06/2012   Procedure: LINQ LOOP RECORDER IMPLANT;  Surgeon: Sanda Klein, MD;  Location: [facility];  Service: Cardiovascular;  Laterality: N/A;  . PERMANENT PACEMAKER INSERTION N/A 02/20/2013   Procedure: PERMANENT PACEMAKER INSERTION;  Surgeon: Sanda Klein, MD;  Location: [facility];  Service: Cardiovascular;  Laterality: N/A;   . POSTERIOR LUMBAR FUSION  ~ 2009  . TRANSTHORACIC ECHOCARDIOGRAM  07/11/2010   The left atrial size is normal.There is no evidence of mitral vavle prolaspe.Right ventriicular systolic pressure is normal . Injection of contrast documented no interatrial shunt. Essentially normal 2D echo -doppler study     Interval History: She actually presents today doing well without Tabitha major complaints. She denies Tabitha further syncopal episodes. No TIA/amaurosis fugax symptoms. She has never had Tabitha chest tightness/pressure with rest or exertion. No exertional dyspnea, but notably has not really been exercising. No PND, orthopnea or edema. No melena, hematochezia, hematuria, or epstaxis. No claudication.  ROS: A comprehensive was performed. Review of Systems  Constitutional: Negative for malaise/fatigue and weight loss.  Respiratory: Positive for cough (Occasional smoker's cough). Negative for shortness of breath (Intermittent) and wheezing.   Cardiovascular: Negative for palpitations.  Gastrointestinal: Positive for heartburn. Negative for blood in stool and melena.  Genitourinary: Negative for hematuria.  Musculoskeletal:       Muscle cramps  Neurological: Negative for dizziness, weakness and headaches.  Endo/Heme/Allergies: Negative.   Psychiatric/Behavioral: Positive for depression (Proving overall now that she is further away from the death of her husband.). Negative for memory loss. The patient is not nervous/anxious and does not have insomnia.   All other systems reviewed and are negative.   Current Outpatient Prescriptions on File Prior to Visit  Medication Sig Dispense  Refill  . ALPRAZolam (XANAX) 0.5 MG tablet Take 1 mg by mouth 2 (two) times daily as needed.     . mirtazapine (REMERON) 30 MG tablet Take 30 mg by mouth at bedtime.    Marland Kitchen omeprazole (PRILOSEC) 40 MG capsule Take 40 mg by mouth 2 (two) times daily.  5  . risperiDONE (RISPERDAL) 1 MG tablet Take 1 mg by mouth at bedtime.    .  sertraline (ZOLOFT) 100 MG tablet Take 100 mg by mouth daily.     No current facility-administered medications on file prior to visit.    Allergies  Allergen Reactions  . Prednisone     Bladder infections  . Penicillins Other (See Comments)    Yeast infection    Social History  Substance Use Topics  . Smoking status: Current Every Day Smoker    Packs/day: 1.00    Years: 40.00    Types: Cigarettes  . Smokeless tobacco: Never Used  . Alcohol use No   Husband recently died of MI 07-Sep-2022.  Died in his car - never made it to work.  family history includes Heart attack in her brother. She was adopted.   Wt Readings from Last 3 Encounters:  12/09/15 47.3 kg (104 lb 3.2 oz)  09/09/14 50.3 kg (111 lb)  06/15/14 53.7 kg (118 lb 6.4 oz)    PHYSICAL EXAM BP 111/79   Pulse 84   Ht 5\' 7"  (1.702 m)   Wt 47.3 kg (104 lb 3.2 oz)   BMI 16.32 kg/m  General appearance: alert, cooperative, appears stated age, no distress; pleasant mood and affect  Neck: no adenopathy, no carotid bruit and no JVD Lungs: clear to auscultation bilaterally, normal percussion bilaterally and non-labored Heart: RRR, normal S1 and S2, no M./R./G. nondisplaced PMI  Abdomen: soft, non-tender; bowel sounds normal; no masses, no organomegaly;  Extremities: extremities normal, atraumatic, no cyanosis or edema  Pulses: 2+ and symmetric Neurologic: Mental status: Alert, oriented, thought content appropriate Cranial nerves: normal (II-XII grossly intact)   Adult ECG Report  Rate: 81 ;  Rhythm: normal sinus rhythm and Possible mild left atrial enlargement. Otherwise normal axis, intervals and durations. Peter read as cannot rule out inferior infarct, age indeterminate is an accurate.  Narrative Interpretation: essentially normal EKG  Other studies Reviewed: Additional studies/ records that were reviewed today include: n/a Review of the above records demonstrates: no new studies Recent Labs:    No results  found for: CHOL, HDL, LDLCALC, LDLDIRECT, TRIG, CHOLHDL   ASSESSMENT / PLAN:  Overall, I don't think that the neck pain issues dating cardiovascular nature. Probably more related to recent surgery. I would not make Tabitha changes to her medications and she is not really on Tabitha cardiac medications.  Problem List Items Addressed This Visit    Tobacco abuse (Chronic)    She is not sure what she can use. Has not really done well with other options in the past, she tried Chantix and that did not help. Is in the least and the consequent stage, but is not yet figured out a strategy. I talked for about [place] Quit Line.      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 (Chronic)    This had been followed by Dr. Sallyanne Kuster. At this point, since this is her really only cardiac issue, I think it makes more sense for her to see Dr. Sallyanne Kuster instead of me. She's not having Tabitha other cardiac issues, and only follow-up is her pacemaker.  Relevant Orders   EKG 12-Lead   Near syncope - Primary   Relevant Orders   EKG 12-Lead   Hx of syncope- s/p Loop recorder 11/06/12 (Chronic)    Symptomatically bradycardia with pauses, now status post pacemaker placement. No recurrent symptoms. Overall doing well and stable. Is not pacemaker dependent.       Other Visit Diagnoses   None.     Current medicines are reviewed at length with the patient today. (+/- concerns) No medication concerns - is concerned about R neck discomfort. The following changes have been made: n/a labs/ tests ordered today include:   Orders Placed This Encounter  Procedures  . EKG 12-Lead    Order Specific Question:   Where should this test be performed    Answer:   Other   No orders of the defined types were placed in this encounter.    Followup: MARCH 2018 WITH DR CROITORU.- MAY FOLLOW UP WITH DR CROITORU ONLY.      Glenetta Hew, M.D., M.S. Interventional Cardiologist   Pager # [phone]

## 2015-12-11 ENCOUNTER — Encounter: Payer: Self-pay | Admitting: Cardiology

## 2015-12-11 NOTE — Assessment & Plan Note (Signed)
She is not sure what she can use. Has not really done well with other options in the past, she tried Chantix and that did not help. Is in the least and the consequent stage, but is not yet figured out a strategy. I talked for about [place] Quit Line.

## 2015-12-11 NOTE — Assessment & Plan Note (Signed)
This had been followed by Dr. Sallyanne Kuster. At this point, since this is her really only cardiac issue, I think it makes more sense for her to see Dr. Sallyanne Kuster instead of me. She's not having any other cardiac issues, and only follow-up is her pacemaker.

## 2015-12-11 NOTE — Assessment & Plan Note (Signed)
Symptomatically bradycardia with pauses, now status post pacemaker placement. No recurrent symptoms. Overall doing well and stable. Is not pacemaker dependent.

## 2016-06-22 ENCOUNTER — Ambulatory Visit (INDEPENDENT_AMBULATORY_CARE_PROVIDER_SITE_OTHER): Payer: Federal, State, Local not specified - PPO | Admitting: Cardiovascular Disease

## 2016-06-22 ENCOUNTER — Encounter: Payer: Self-pay | Admitting: Cardiovascular Disease

## 2016-06-22 VITALS — BP 90/70 | HR 87 | Ht 67.0 in | Wt 117.2 lb

## 2016-06-22 DIAGNOSIS — Z950 Presence of cardiac pacemaker: Secondary | ICD-10-CM | POA: Diagnosis not present

## 2016-06-22 DIAGNOSIS — Z720 Tobacco use: Secondary | ICD-10-CM

## 2016-06-22 DIAGNOSIS — I455 Other specified heart block: Secondary | ICD-10-CM | POA: Diagnosis not present

## 2016-06-22 NOTE — Progress Notes (Signed)
Cardiology Office Note    Date:  06/22/2016   ID:  Dempsey Knotek, DOB [birth year], MRN [id number]  PCP:  Sherian Maroon, MD  Cardiologist:   Sanda Klein, MD   Chief Complaint  Patient presents with  . Follow-up    pt has no complaints     History of Present Illness:  [name] is a 61 y.o. 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 At the time it was unclear whether she had conduction system disease or severe cardioinhibitory neurally mediated syncope, it appears the latter was the case. She requires very little pacing. However after the pacemaker was implanted she has not had any further attempts of syncope. Her daughter [name] has inappropriate sinus tachycardia. Her other daughter had a stillborn child with a BMP mutation for ARVC and maybe a mutation carrier (unclear to me whether she has clinically relevant ARVC).  She returns today for pacemaker follow-up. She feels great and has not had syncope, palpitations or other cardiovascular complaints. She denies exertional dyspnea or angina focal neurological events or lower extremity edema. She is very slim with a BMI just over 18. She denies difficulty with her appetite, eating disorders, abdominal pain or diarrhea.  Pacemaker interrogation shows normal function. She has a Medtronic Adapta dual-chamber device implanted in December 2014 but still has about 13 years of estimated longevity. Lead parameters are excellent (both leads are Medtronic 5076 leads). She has only 0.9% ventricular pacing and less than 0.1% atrial pacing. The device is programmed with a lower rate limit of 50 bpm and search AV+ is on. In the 4 years since implantation she has had a handful of episodes of rapid ventricular rates, most of which appear to be atrial tachycardia with 1:1 AV conduction (some episodes of "mode switch" are due to far field R-wave oversensing).   Past Medical History:  Diagnosis Date    . Anxiety    anxiety and mild depressive order systoms  . Bipolar disorder ([place])     (02/20/2013)  . Dysrhythmia   . GYFVCBSW(967.5)    "monthly" (02/20/2013)  . Pacemaker    medtronic  . PONV (postoperative nausea and vomiting)   . Sinus pause 02/14/2013  . Syncope and collapse    echo-April 12,2012-EF 55% Echo normal; recorder with prolonged sinus pauses --> status post  . Tobacco abuse   . Vertigo     Past Surgical History:  Procedure Laterality Date  . ABDOMINAL HYSTERECTOMY  03/19/1980   "partial"  . BACK SURGERY    . CERVICAL FUSION Left 11/2013   C4-C6  . CERVICAL FUSION     3,4,5,  . INSERT / REPLACE / REMOVE PACEMAKER  02/20/2013   medtronic  . LOOP RECORDER IMPLANT N/A 11/06/2012   Procedure: LINQ LOOP RECORDER IMPLANT;  Surgeon: Sanda Klein, MD;  Location: [facility];  Service: Cardiovascular;  Laterality: N/A;  . PERMANENT PACEMAKER INSERTION N/A 02/20/2013   Procedure: PERMANENT PACEMAKER INSERTION;  Surgeon: Sanda Klein, MD;  Location: [facility];  Service: Cardiovascular;  Laterality: N/A;  . POSTERIOR LUMBAR FUSION  ~ 2009  . TRANSTHORACIC ECHOCARDIOGRAM  07/11/2010   The left atrial size is normal.There is no evidence of mitral vavle prolaspe.Right ventriicular systolic pressure is normal . Injection of contrast documented no interatrial shunt. Essentially normal 2D echo -doppler study     Current Medications: Outpatient Medications Prior to Visit  Medication Sig Dispense Refill  . ALPRAZolam (XANAX) 0.5 MG tablet  Take 1 mg by mouth 2 (two) times daily as needed.     . mirtazapine (REMERON) 30 MG tablet Take 30 mg by mouth at bedtime.    Marland Kitchen omeprazole (PRILOSEC) 40 MG capsule Take 40 mg by mouth 2 (two) times daily.  5  . risperiDONE (RISPERDAL) 1 MG tablet Take 1 mg by mouth at bedtime.    . sertraline (ZOLOFT) 100 MG tablet Take 100 mg by mouth daily.     No facility-administered medications prior to visit.      Allergies:   Penicillins and  Prednisone   Social History   Social History  . Marital status: Married    Spouse name: N/A  . Number of children: N/A  . Years of education: N/A   Social History Main Topics  . Smoking status: Current Every Day Smoker    Packs/day: 1.00    Years: 40.00    Types: Cigarettes  . Smokeless tobacco: Never Used  . Alcohol use No  . Drug use: No  . Sexual activity: Yes   Other Topics Concern  . None   Social History Narrative   Married 29 years . Has 3 children. Current smoker -1 pack a day-for 36 years .Alcohol :  1 beer on occasion.   She is soon to be a grandmother.  His before to going on a trip up to the [place] to be closer to family when the baby is born.  She is extremely excited.   She very much would like to move back up to that area to be closer to family, but the whether is it just too cold in the winter.     Family History:  The patient's family history includes Heart attack in her brother. She was adopted.  one daughter is a mutation carrier for ARVC, the other has inappropriate sinus tachycardia   ROS:   Please see the history of present illness.    ROS All other systems reviewed and are negative.   PHYSICAL EXAM:   VS:  BP 90/70 (BP Location: Left Arm, Patient Position: Sitting, Cuff Size: Normal)   Pulse 87   Ht 5\' 7"  (1.702 m)   Wt 53.2 kg (117 lb 3.2 oz)   BMI 18.36 kg/m    GEN: Well nourished, well developed, in no acute distress  HEENT: normal  Neck: no JVD, carotid bruits, or masses Cardiac: Well-healed pacemaker site in the left subclavian area, RRR; no murmurs, rubs, or gallops,no edema  Respiratory:  clear to auscultation bilaterally, normal work of breathing GI: soft, nontender, nondistended, + BS MS: no deformity or atrophy  Skin: warm and dry, no rash Neuro:  Alert and Oriented x 3, Strength and sensation are intact Psych: euthymic mood, full affect  Wt Readings from Last 3 Encounters:  06/22/16 53.2 kg (117 lb 3.2 oz)    12/09/15 47.3 kg (104 lb 3.2 oz)  09/09/14 50.3 kg (111 lb)      Studies/Labs Reviewed:   EKG:  EKG is ordered today.  The ekg ordered today demonstrates NSR   ASSESSMENT:    1. Tobacco abuse   2. Sinus pause   3. Pacemaker      PLAN:  In order of problems listed above:  1. Syncope: I believe neurally mediated/cardioinhibitory. Loop recorder revealed several episodes of symptomatic pauses prior to pacemaker implantation. Syncope completely abolished after pacemaker implantation. 2. PM: We'll pacemaker function. Over the last year we have not received a remote downloads. Need  to make sure these continue every 3 months. 3. Strongly encouraged to quit smoking.    Medication Adjustments/Labs and Tests Ordered: Current medicines are reviewed at length with the patient today.  Concerns regarding medicines are outlined above.  Medication changes, Labs and Tests ordered today are listed in the Patient Instructions below. Patient Instructions  Dr Sallyanne Kuster recommends that you continue on your current medications as directed. Please refer to the Current Medication list given to you today.  Remote monitoring is used to monitor your Pacemaker of ICD from home. This monitoring reduces the number of office visits required to check your device to one time per year. It allows [place] to keep an eye on the functioning of your device to ensure it is working properly. You are scheduled for a device check from home on Friday, July 6th, 2018. You may send your transmission at any time that day. If you have a wireless device, the transmission will be sent automatically. After your physician reviews your transmission, you will receive a postcard with your next transmission date.  Dr Sallyanne Kuster recommends that you schedule a follow-up appointment in 12 months with a pacemaker check. You will receive a reminder letter in the mail two months in advance. If you don't receive a letter, please call our office to  schedule the follow-up appointment.  If you need a refill on your cardiac medications before your next appointment, please call your pharmacy.    Signed, Sanda Klein, MD  06/22/2016 2:49 PM    [facility] [address], [place], [place]  [address] Phone: [phone]; Fax: [phone]

## 2016-06-22 NOTE — Patient Instructions (Signed)
Dr Sallyanne Kuster recommends that you continue on your current medications as directed. Please refer to the Current Medication list given to you today.  Remote monitoring is used to monitor your Pacemaker of ICD from home. This monitoring reduces the number of office visits required to check your device to one time per year. It allows [place] to keep an eye on the functioning of your device to ensure it is working properly. You are scheduled for a device check from home on Friday, July 6th, 2018. You may send your transmission at any time that day. If you have a wireless device, the transmission will be sent automatically. After your physician reviews your transmission, you will receive a postcard with your next transmission date.  Dr Sallyanne Kuster recommends that you schedule a follow-up appointment in 12 months with a pacemaker check. You will receive a reminder letter in the mail two months in advance. If you don't receive a letter, please call our office to schedule the follow-up appointment.  If you need a refill on your cardiac medications before your next appointment, please call your pharmacy.

## 2016-07-11 LAB — CUP PACEART INCLINIC DEVICE CHECK
Battery Impedance: 160 Ohm
Battery Remaining Longevity: 156 mo
Battery Voltage: 2.79 V
Brady Statistic AP VP Percent: 0 %
Brady Statistic AP VS Percent: 0 %
Brady Statistic AS VP Percent: 1 %
Brady Statistic AS VS Percent: 99 %
Date Time Interrogation Session: 20180406142516
Implantable Lead Implant Date: 20141205
Implantable Lead Implant Date: 20141205
Implantable Lead Location: 753859
Implantable Lead Location: 753860
Implantable Lead Model: 5076
Implantable Lead Model: 5076
Implantable Pulse Generator Implant Date: 20141205
Lead Channel Impedance Value: 475 Ohm
Lead Channel Impedance Value: 493 Ohm
Lead Channel Pacing Threshold Amplitude: 0.5 V
Lead Channel Pacing Threshold Amplitude: 1 V
Lead Channel Pacing Threshold Pulse Width: 0.4 ms
Lead Channel Pacing Threshold Pulse Width: 0.4 ms
Lead Channel Setting Pacing Amplitude: 2 V
Lead Channel Setting Pacing Amplitude: 3 V
Lead Channel Setting Pacing Pulse Width: 0.4 ms
Lead Channel Setting Sensing Sensitivity: 2 mV

## 2016-07-23 DIAGNOSIS — F39 Unspecified mood [affective] disorder: Secondary | ICD-10-CM | POA: Diagnosis not present

## 2016-08-14 DIAGNOSIS — M5441 Lumbago with sciatica, right side: Secondary | ICD-10-CM | POA: Diagnosis not present

## 2016-08-14 DIAGNOSIS — M5136 Other intervertebral disc degeneration, lumbar region: Secondary | ICD-10-CM | POA: Diagnosis not present

## 2016-08-14 DIAGNOSIS — M961 Postlaminectomy syndrome, not elsewhere classified: Secondary | ICD-10-CM | POA: Diagnosis not present

## 2016-08-14 DIAGNOSIS — G8929 Other chronic pain: Secondary | ICD-10-CM | POA: Diagnosis not present

## 2016-08-25 DIAGNOSIS — M545 Low back pain: Secondary | ICD-10-CM | POA: Diagnosis not present

## 2016-09-07 DIAGNOSIS — F39 Unspecified mood [affective] disorder: Secondary | ICD-10-CM | POA: Diagnosis not present

## 2016-09-11 DIAGNOSIS — F39 Unspecified mood [affective] disorder: Secondary | ICD-10-CM | POA: Diagnosis not present

## 2016-09-11 DIAGNOSIS — Z79899 Other long term (current) drug therapy: Secondary | ICD-10-CM | POA: Diagnosis not present

## 2016-09-21 ENCOUNTER — Telehealth: Payer: Self-pay | Admitting: Cardiology

## 2016-09-21 ENCOUNTER — Ambulatory Visit (INDEPENDENT_AMBULATORY_CARE_PROVIDER_SITE_OTHER): Payer: Medicare Other | Admitting: *Deleted

## 2016-09-21 DIAGNOSIS — I455 Other specified heart block: Secondary | ICD-10-CM | POA: Diagnosis not present

## 2016-09-21 NOTE — Telephone Encounter (Signed)
Spoke with pt and reminded pt of remote transmission that is due today. Pt verbalized understanding.   

## 2016-09-21 NOTE — Progress Notes (Signed)
Remote pacemaker transmission.   

## 2016-09-28 ENCOUNTER — Encounter: Payer: Self-pay | Admitting: Cardiology

## 2016-10-16 LAB — CUP PACEART REMOTE DEVICE CHECK
Battery Impedance: 160 Ohm
Battery Remaining Longevity: 155 mo
Battery Voltage: 2.79 V
Brady Statistic AP VP Percent: 0 %
Brady Statistic AP VS Percent: 0 %
Brady Statistic AS VP Percent: 1 %
Brady Statistic AS VS Percent: 99 %
Date Time Interrogation Session: 20180706145105
Implantable Lead Implant Date: 20141205
Implantable Lead Implant Date: 20141205
Implantable Lead Location: 753859
Implantable Lead Location: 753860
Implantable Lead Model: 5076
Implantable Lead Model: 5076
Implantable Pulse Generator Implant Date: 20141205
Lead Channel Impedance Value: 456 Ohm
Lead Channel Impedance Value: 464 Ohm
Lead Channel Pacing Threshold Amplitude: 0.5 V
Lead Channel Pacing Threshold Amplitude: 1 V
Lead Channel Pacing Threshold Pulse Width: 0.4 ms
Lead Channel Pacing Threshold Pulse Width: 0.4 ms
Lead Channel Sensing Intrinsic Amplitude: 1.4 mV
Lead Channel Sensing Intrinsic Amplitude: 5.6 mV
Lead Channel Setting Pacing Amplitude: 2 V
Lead Channel Setting Pacing Amplitude: 2.75 V
Lead Channel Setting Pacing Pulse Width: 0.4 ms
Lead Channel Setting Sensing Sensitivity: 2 mV

## 2016-11-02 DIAGNOSIS — F39 Unspecified mood [affective] disorder: Secondary | ICD-10-CM | POA: Diagnosis not present

## 2016-11-06 DIAGNOSIS — M7502 Adhesive capsulitis of left shoulder: Secondary | ICD-10-CM | POA: Diagnosis not present

## 2016-11-06 DIAGNOSIS — M25512 Pain in left shoulder: Secondary | ICD-10-CM | POA: Diagnosis not present

## 2016-11-09 DIAGNOSIS — R7301 Impaired fasting glucose: Secondary | ICD-10-CM | POA: Diagnosis not present

## 2016-11-09 DIAGNOSIS — E7800 Pure hypercholesterolemia, unspecified: Secondary | ICD-10-CM | POA: Diagnosis not present

## 2016-11-09 DIAGNOSIS — Z720 Tobacco use: Secondary | ICD-10-CM | POA: Diagnosis not present

## 2016-11-14 DIAGNOSIS — M7502 Adhesive capsulitis of left shoulder: Secondary | ICD-10-CM | POA: Diagnosis not present

## 2016-11-15 ENCOUNTER — Other Ambulatory Visit: Payer: Self-pay | Admitting: Acute Care

## 2016-11-15 DIAGNOSIS — F17210 Nicotine dependence, cigarettes, uncomplicated: Secondary | ICD-10-CM

## 2016-11-15 DIAGNOSIS — Z122 Encounter for screening for malignant neoplasm of respiratory organs: Secondary | ICD-10-CM

## 2016-11-21 DIAGNOSIS — M7502 Adhesive capsulitis of left shoulder: Secondary | ICD-10-CM | POA: Diagnosis not present

## 2016-11-23 ENCOUNTER — Ambulatory Visit (INDEPENDENT_AMBULATORY_CARE_PROVIDER_SITE_OTHER): Payer: Medicare Other | Admitting: Acute Care

## 2016-11-23 ENCOUNTER — Encounter: Payer: Self-pay | Admitting: Acute Care

## 2016-11-23 ENCOUNTER — Ambulatory Visit (INDEPENDENT_AMBULATORY_CARE_PROVIDER_SITE_OTHER)
Admission: RE | Admit: 2016-11-23 | Discharge: 2016-11-23 | Disposition: A | Discharge status: Home / Self Care | Payer: Medicare Other | Source: Ambulatory Visit | Attending: Acute Care | Admitting: Acute Care

## 2016-11-23 DIAGNOSIS — F17210 Nicotine dependence, cigarettes, uncomplicated: Secondary | ICD-10-CM | POA: Diagnosis not present

## 2016-11-23 DIAGNOSIS — Z87891 Personal history of nicotine dependence: Secondary | ICD-10-CM | POA: Diagnosis not present

## 2016-11-23 DIAGNOSIS — M7502 Adhesive capsulitis of left shoulder: Secondary | ICD-10-CM | POA: Diagnosis not present

## 2016-11-23 DIAGNOSIS — Z122 Encounter for screening for malignant neoplasm of respiratory organs: Secondary | ICD-10-CM

## 2016-11-23 NOTE — Progress Notes (Signed)
Shared Decision Making Visit Lung Cancer Screening Program [phone])   Eligibility:  Age 61 y.o.  Pack Years Smoking History Calculation 33-pack-year smoking history (# packs/per year x # years smoked)  Recent History of coughing up blood  no  Unexplained weight loss? no ( >Than 15 pounds within the last 6 months )  Prior History Lung / other cancer no (Diagnosis within the last 5 years already requiring surveillance chest CT Scans).  Smoking Status Current Smoker  Former Smokers: Years since quit:No  Quit Date: no  Visit Components:  Discussion included one or more decision making aids. yes  Discussion included risk/benefits of screening. yes  Discussion included potential follow up diagnostic testing for abnormal scans. yes  Discussion included meaning and risk of over diagnosis. yes  Discussion included meaning and risk of False Positives. yes  Discussion included meaning of total radiation exposure. yes  Counseling Included:  Importance of adherence to annual lung cancer LDCT screening. yes  Impact of comorbidities on ability to participate in the program. yes  Ability and willingness to under diagnostic treatment. yes  Smoking Cessation Counseling:  Current Smokers:   Discussed importance of smoking cessation. yes  Information about tobacco cessation classes and interventions provided to patient. yes  Patient provided with "ticket" for LDCT Scan. yes  Symptomatic Patient. no  Counseling NA  Diagnosis Code: Tobacco Use Z72.0  Asymptomatic Patient yes  Counseling (Intermediate counseling: > three minutes counseling) I3382  Former Smokers:   Discussed the importance of maintaining cigarette abstinence. yes  Diagnosis Code: Personal History of Nicotine Dependence. N05.397  Information about tobacco cessation classes and interventions provided to patient. Yes  Patient provided with "ticket" for LDCT Scan. yes  Written Order for Lung Cancer  Screening with LDCT placed in Epic. Yes (CT Chest Lung Cancer Screening Low Dose W/O CM) [id number] Z12.2-Screening of respiratory organs Z87.891-Personal history of nicotine dependence  I have spent 25 minutes of face to face time with Ms. Gucciardo discussing the risks and benefits of lung cancer screening. We viewed a power point together that explained in detail the above noted topics. We paused at intervals to allow for questions to be asked and answered to ensure understanding.We discussed that the single most powerful action that she can take to decrease her risk of developing lung cancer is to quit smoking. We discussed whether or not she is ready to commit to setting a quit date. She is currently not ready to set a quit date. We discussed options for tools to aid in quitting smoking including nicotine replacement therapy, non-nicotine medications, support groups, Quit Smart classes, and behavior modification. We discussed that often times setting smaller, more achievable goals, such as eliminating 1 cigarette a day for a week and then 2 cigarettes a day for a week can be helpful in slowly decreasing the number of cigarettes smoked. This allows for a sense of accomplishment as well as providing a clinical benefit. I gave her the " Be Stronger Than Your Excuses" card with contact information for community resources, classes, free nicotine replacement therapy, and access to mobile apps, text messaging, and on-line smoking cessation help. I have also given her my card and contact information in the event she needs to contact me. We discussed the time and location of the scan, and that either Doroteo Glassman RN or I will call with the results within 24-48 hours of receiving them. I have offered Ms. Rask  a copy of the power point we viewed  as  a resource in the event they need reinforcement of the concepts we discussed today in the office. The patient verbalized understanding of all of  the above and had no further  questions upon leaving the office. They have my contact information in the event they have any further questions.  I spent 4 minutes counseling on smoking cessation and the health risks of continued tobacco abuse.  I explained to the patient that there has been a high incidence of coronary artery disease noted on these exams. I explained that this is a non-gated exam therefore degree or severity cannot be determined. This patient is not on statin therapy. I have asked the patient to follow-up with their PCP regarding any incidental finding of coronary artery disease and management with diet or medication as their PCP  feels is clinically indicated. The patient verbalized understanding of the above and had no further questions upon completion of the visit.      Magdalen Spatz, NP 11/23/2016

## 2016-11-27 DIAGNOSIS — M7502 Adhesive capsulitis of left shoulder: Secondary | ICD-10-CM | POA: Diagnosis not present

## 2016-11-29 ENCOUNTER — Other Ambulatory Visit: Payer: Self-pay | Admitting: Acute Care

## 2016-11-29 DIAGNOSIS — Z122 Encounter for screening for malignant neoplasm of respiratory organs: Secondary | ICD-10-CM

## 2016-11-29 DIAGNOSIS — F17210 Nicotine dependence, cigarettes, uncomplicated: Secondary | ICD-10-CM

## 2016-12-04 DIAGNOSIS — M7502 Adhesive capsulitis of left shoulder: Secondary | ICD-10-CM | POA: Diagnosis not present

## 2016-12-04 DIAGNOSIS — F39 Unspecified mood [affective] disorder: Secondary | ICD-10-CM | POA: Diagnosis not present

## 2016-12-07 DIAGNOSIS — M7502 Adhesive capsulitis of left shoulder: Secondary | ICD-10-CM | POA: Diagnosis not present

## 2016-12-15 DIAGNOSIS — G500 Trigeminal neuralgia: Secondary | ICD-10-CM | POA: Diagnosis not present

## 2016-12-18 DIAGNOSIS — M792 Neuralgia and neuritis, unspecified: Secondary | ICD-10-CM | POA: Diagnosis not present

## 2016-12-18 DIAGNOSIS — Z23 Encounter for immunization: Secondary | ICD-10-CM | POA: Diagnosis not present

## 2016-12-18 DIAGNOSIS — K219 Gastro-esophageal reflux disease without esophagitis: Secondary | ICD-10-CM | POA: Diagnosis not present

## 2016-12-18 DIAGNOSIS — M26622 Arthralgia of left temporomandibular joint: Secondary | ICD-10-CM | POA: Diagnosis not present

## 2016-12-21 DIAGNOSIS — H5712 Ocular pain, left eye: Secondary | ICD-10-CM | POA: Diagnosis not present

## 2016-12-21 DIAGNOSIS — R51 Headache: Secondary | ICD-10-CM | POA: Diagnosis not present

## 2016-12-24 ENCOUNTER — Telehealth: Payer: Self-pay | Admitting: Cardiology

## 2016-12-24 ENCOUNTER — Ambulatory Visit (INDEPENDENT_AMBULATORY_CARE_PROVIDER_SITE_OTHER): Payer: Medicare Other | Admitting: *Deleted

## 2016-12-24 DIAGNOSIS — I455 Other specified heart block: Secondary | ICD-10-CM

## 2016-12-24 NOTE — Telephone Encounter (Signed)
LMOVM reminding pt to send remote transmission.   

## 2016-12-25 LAB — CUP PACEART REMOTE DEVICE CHECK
Battery Impedance: 160 Ohm
Battery Remaining Longevity: 146 mo
Battery Voltage: 2.79 V
Brady Statistic AP VP Percent: 0 %
Brady Statistic AP VS Percent: 0 %
Brady Statistic AS VP Percent: 1 %
Brady Statistic AS VS Percent: 99 %
Date Time Interrogation Session: 20181008173148
Implantable Lead Implant Date: 20141205
Implantable Lead Implant Date: 20141205
Implantable Lead Location: 753859
Implantable Lead Location: 753860
Implantable Lead Model: 5076
Implantable Lead Model: 5076
Implantable Pulse Generator Implant Date: 20141205
Lead Channel Impedance Value: 460 Ohm
Lead Channel Impedance Value: 472 Ohm
Lead Channel Pacing Threshold Amplitude: 0.5 V
Lead Channel Pacing Threshold Amplitude: 0.875 V
Lead Channel Pacing Threshold Pulse Width: 0.4 ms
Lead Channel Pacing Threshold Pulse Width: 0.4 ms
Lead Channel Setting Pacing Amplitude: 2 V
Lead Channel Setting Pacing Amplitude: 2.5 V
Lead Channel Setting Pacing Pulse Width: 0.4 ms
Lead Channel Setting Sensing Sensitivity: 2 mV

## 2016-12-25 NOTE — Progress Notes (Signed)
Remote pacemaker transmission.   

## 2016-12-28 ENCOUNTER — Encounter: Payer: Self-pay | Admitting: Cardiology

## 2017-01-02 DIAGNOSIS — M961 Postlaminectomy syndrome, not elsewhere classified: Secondary | ICD-10-CM | POA: Diagnosis not present

## 2017-01-11 DIAGNOSIS — J069 Acute upper respiratory infection, unspecified: Secondary | ICD-10-CM | POA: Diagnosis not present

## 2017-01-11 DIAGNOSIS — J029 Acute pharyngitis, unspecified: Secondary | ICD-10-CM | POA: Diagnosis not present

## 2017-01-11 DIAGNOSIS — R05 Cough: Secondary | ICD-10-CM | POA: Diagnosis not present

## 2017-01-24 DIAGNOSIS — F39 Unspecified mood [affective] disorder: Secondary | ICD-10-CM | POA: Diagnosis not present

## 2017-01-25 DIAGNOSIS — M542 Cervicalgia: Secondary | ICD-10-CM | POA: Diagnosis not present

## 2017-01-25 DIAGNOSIS — Z981 Arthrodesis status: Secondary | ICD-10-CM | POA: Diagnosis not present

## 2017-02-08 DIAGNOSIS — N3000 Acute cystitis without hematuria: Secondary | ICD-10-CM | POA: Diagnosis not present

## 2017-02-08 DIAGNOSIS — N390 Urinary tract infection, site not specified: Secondary | ICD-10-CM | POA: Diagnosis not present

## 2017-02-14 DIAGNOSIS — M542 Cervicalgia: Secondary | ICD-10-CM | POA: Diagnosis not present

## 2017-02-27 DIAGNOSIS — M25561 Pain in right knee: Secondary | ICD-10-CM | POA: Diagnosis not present

## 2017-03-04 ENCOUNTER — Other Ambulatory Visit: Payer: Self-pay | Admitting: Orthopedic Surgery

## 2017-03-04 DIAGNOSIS — M25561 Pain in right knee: Secondary | ICD-10-CM

## 2017-03-05 DIAGNOSIS — F39 Unspecified mood [affective] disorder: Secondary | ICD-10-CM | POA: Diagnosis not present

## 2017-03-20 DIAGNOSIS — M2550 Pain in unspecified joint: Secondary | ICD-10-CM | POA: Diagnosis not present

## 2017-03-20 DIAGNOSIS — M791 Myalgia, unspecified site: Secondary | ICD-10-CM | POA: Diagnosis not present

## 2017-03-22 ENCOUNTER — Ambulatory Visit
Admission: RE | Admit: 2017-03-22 | Discharge: 2017-03-22 | Disposition: A | Discharge status: Home / Self Care | Payer: Medicare Other | Source: Ambulatory Visit | Attending: Orthopedic Surgery | Admitting: Orthopedic Surgery

## 2017-03-22 DIAGNOSIS — M25561 Pain in right knee: Secondary | ICD-10-CM

## 2017-03-22 MED ORDER — IOPAMIDOL (ISOVUE-M 200) INJECTION 41%
30.0000 mL | Freq: Once | INTRAMUSCULAR | Status: AC
  Administered 2017-03-22: 30 mL via INTRA_ARTICULAR

## 2017-03-25 DIAGNOSIS — R399 Unspecified symptoms and signs involving the genitourinary system: Secondary | ICD-10-CM | POA: Diagnosis not present

## 2017-03-26 ENCOUNTER — Ambulatory Visit (INDEPENDENT_AMBULATORY_CARE_PROVIDER_SITE_OTHER): Payer: Medicare HMO | Admitting: *Deleted

## 2017-03-26 ENCOUNTER — Telehealth: Payer: Self-pay | Admitting: Cardiology

## 2017-03-26 DIAGNOSIS — I455 Other specified heart block: Secondary | ICD-10-CM | POA: Diagnosis not present

## 2017-03-26 NOTE — Telephone Encounter (Signed)
Spoke with pt and reminded pt of remote transmission that is due today. Pt verbalized understanding.   

## 2017-03-27 ENCOUNTER — Encounter: Payer: Self-pay | Admitting: Cardiology

## 2017-03-27 NOTE — Progress Notes (Signed)
Remote pacemaker transmission.   

## 2017-03-28 LAB — CUP PACEART REMOTE DEVICE CHECK
Battery Impedance: 208 Ohm
Battery Remaining Longevity: 136 mo
Battery Voltage: 2.79 V
Brady Statistic AP VP Percent: 0 %
Brady Statistic AP VS Percent: 0 %
Brady Statistic AS VP Percent: 1 %
Brady Statistic AS VS Percent: 99 %
Date Time Interrogation Session: 20190108165831
Implantable Lead Implant Date: 20141205
Implantable Lead Implant Date: 20141205
Implantable Lead Location: 753859
Implantable Lead Location: 753860
Implantable Lead Model: 5076
Implantable Lead Model: 5076
Implantable Pulse Generator Implant Date: 20141205
Lead Channel Impedance Value: 441 Ohm
Lead Channel Impedance Value: 464 Ohm
Lead Channel Pacing Threshold Amplitude: 0.375 V
Lead Channel Pacing Threshold Amplitude: 1.125 V
Lead Channel Pacing Threshold Pulse Width: 0.4 ms
Lead Channel Pacing Threshold Pulse Width: 0.4 ms
Lead Channel Setting Pacing Amplitude: 2 V
Lead Channel Setting Pacing Amplitude: 2.5 V
Lead Channel Setting Pacing Pulse Width: 0.4 ms
Lead Channel Setting Sensing Sensitivity: 2 mV

## 2017-04-02 DIAGNOSIS — F39 Unspecified mood [affective] disorder: Secondary | ICD-10-CM | POA: Diagnosis not present

## 2017-04-02 DIAGNOSIS — R69 Illness, unspecified: Secondary | ICD-10-CM | POA: Diagnosis not present

## 2017-04-04 DIAGNOSIS — M25561 Pain in right knee: Secondary | ICD-10-CM | POA: Diagnosis not present

## 2017-04-05 DIAGNOSIS — R399 Unspecified symptoms and signs involving the genitourinary system: Secondary | ICD-10-CM | POA: Diagnosis not present

## 2017-04-10 DIAGNOSIS — M25511 Pain in right shoulder: Secondary | ICD-10-CM | POA: Diagnosis not present

## 2017-04-27 DIAGNOSIS — H1031 Unspecified acute conjunctivitis, right eye: Secondary | ICD-10-CM | POA: Diagnosis not present

## 2017-04-29 DIAGNOSIS — S0502XA Injury of conjunctiva and corneal abrasion without foreign body, left eye, initial encounter: Secondary | ICD-10-CM | POA: Diagnosis not present

## 2017-04-30 DIAGNOSIS — R69 Illness, unspecified: Secondary | ICD-10-CM | POA: Diagnosis not present

## 2017-06-21 DIAGNOSIS — M5416 Radiculopathy, lumbar region: Secondary | ICD-10-CM | POA: Diagnosis not present

## 2017-06-25 ENCOUNTER — Encounter: Payer: Self-pay | Admitting: Cardiovascular Disease

## 2017-06-25 ENCOUNTER — Ambulatory Visit (INDEPENDENT_AMBULATORY_CARE_PROVIDER_SITE_OTHER): Payer: Medicare HMO | Admitting: Cardiovascular Disease

## 2017-06-25 ENCOUNTER — Telehealth: Payer: Self-pay | Admitting: Cardiology

## 2017-06-25 ENCOUNTER — Ambulatory Visit (INDEPENDENT_AMBULATORY_CARE_PROVIDER_SITE_OTHER): Payer: Medicare HMO | Admitting: *Deleted

## 2017-06-25 VITALS — BP 104/78 | HR 82 | Ht 67.0 in | Wt 122.2 lb

## 2017-06-25 DIAGNOSIS — R0602 Shortness of breath: Secondary | ICD-10-CM | POA: Diagnosis not present

## 2017-06-25 DIAGNOSIS — Z950 Presence of cardiac pacemaker: Secondary | ICD-10-CM

## 2017-06-25 DIAGNOSIS — I455 Other specified heart block: Secondary | ICD-10-CM

## 2017-06-25 DIAGNOSIS — F17200 Nicotine dependence, unspecified, uncomplicated: Secondary | ICD-10-CM | POA: Diagnosis not present

## 2017-06-25 DIAGNOSIS — R69 Illness, unspecified: Secondary | ICD-10-CM | POA: Diagnosis not present

## 2017-06-25 NOTE — Telephone Encounter (Signed)
Spoke with pt and reminded pt of remote transmission that is due today. Pt verbalized understanding.   

## 2017-06-25 NOTE — Patient Instructions (Addendum)
Dr Sallyanne Kuster recommends that you continue on your current medications as directed. Please refer to the Current Medication list given to you today.  Your physician has requested that you have an echocardiogram. Echocardiography is a painless test that uses sound waves to create images of your heart. It provides your doctor with information about the size and shape of your heart and how well your heart's chambers and valves are working. This procedure takes approximately one hour. There are no restrictions for this procedure. >>This will be performed at our [facility] location [place], [place] [address] [phone]  Remote monitoring is used to monitor your Pacemaker or ICD from home. This monitoring reduces the number of office visits required to check your device to one time per year. It allows [place] to keep an eye on the functioning of your device to ensure it is working properly. You are scheduled for a device check from home on Tuesday, July 9th, 2019. You may send your transmission at any time that day. If you have a wireless device, the transmission will be sent automatically. After your physician reviews your transmission, you will receive a notification with your next transmission date.  Dr Sallyanne Kuster recommends that you schedule a follow-up appointment in 12 months with a pacemaker check. You will receive a reminder letter in the mail two months in advance. If you don't receive a letter, please call our office to schedule the follow-up appointment.  If you need a refill on your cardiac medications before your next appointment, please call your pharmacy.

## 2017-06-26 NOTE — Progress Notes (Signed)
Cardiology Office Note    Date:  06/30/2017   ID:  [name], DOB 12/13/1955, MRN [id number]  PCP:  Janifer Adie, MD  Cardiologist:   Sanda Klein, MD   Chief Complaint  Patient presents with  . Shortness of Breath    History of Present Illness:  [name] is a 62 y.o. 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 At the time it was unclear whether she had conduction system disease or severe cardioinhibitory neurally mediated syncope, it appears the latter was the case. She requires very little pacing. However after the pacemaker was implanted she has not had any further attempts of syncope. Her daughter [name] has inappropriate sinus tachycardia. Her other daughter had a stillborn child with a BMP mutation for ARVC and maybe a mutation carrier (unclear whether she has clinically relevant ARVC).  She returns today for pacemaker follow-up.  She complains about new shortness of breath that began about a month ago.  She has noticed that if she climbs a flight of stairs, but sometimes with even lighter activity such as taking a shower.  She has not had any wheezing or cough.  She continues to smoke about 15 cigarettes a day and has done so for the last 40 years.  The patient specifically denies any chest pain at rest or with exertion,  orthopnea, paroxysmal nocturnal dyspnea, syncope, palpitations, focal neurological deficits, intermittent claudication, lower extremity edema, unexplained weight gain, cough, hemoptysis or wheezing.  The patient also denies abdominal pain, nausea, vomiting, dysphagia, diarrhea, constipation, polyuria, polydipsia, dysuria, hematuria, frequency, urgency, abnormal bleeding or bruising, fever, chills, unexpected weight changes, mood swings, change in skin or hair texture, change in voice quality, auditory or visual problems, allergic reactions or rashes, new musculoskeletal complaints other than usual "aches and  pains".  She remains very lean with a BMI of only 19, but has not lost any weight since her last appointment.   Pacemaker interrogation shows normal function. She has a Medtronic Adapta dual-chamber device implanted in December 2014 but still has about 12 years of estimated longevity. Lead parameters are excellent (both leads are Medtronic 5076 leads). She has only 1% ventricular pacing and less than 0.1% atrial pacing. The device is programmed with a lower rate limit of 50 bpm and search AV+ is on.  As before, she has very rare episodes of paroxysmal atrial tachycardia with 1: 1 AV conduction.  A few episodes of so-called atrial fibrillation or due to far field R wave oversensing.  No ventricular arrhythmias are seen.               Past Medical History:  Diagnosis Date  . Anxiety    anxiety and mild depressive order systoms  . Bipolar disorder ([place])     (02/20/2013)  . Dysrhythmia   . CZYSAYTK(160.1)    "monthly" (02/20/2013)  . Pacemaker    medtronic  . PONV (postoperative nausea and vomiting)   . Sinus pause 02/14/2013  . Syncope and collapse    echo-April 12,2012-EF 55% Echo normal; recorder with prolonged sinus pauses --> status post  . Tobacco abuse   . Vertigo     Past Surgical History:  Procedure Laterality Date  . ABDOMINAL HYSTERECTOMY  03/19/1980   "partial"  . BACK SURGERY    . CERVICAL FUSION Left 11/2013   C4-C6  . CERVICAL FUSION     3,4,5,  . INSERT / REPLACE / REMOVE PACEMAKER  02/20/2013  medtronic  . LOOP RECORDER IMPLANT N/A 11/06/2012   Procedure: LINQ LOOP RECORDER IMPLANT;  Surgeon: Sanda Klein, MD;  Location: [facility];  Service: Cardiovascular;  Laterality: N/A;  . PERMANENT PACEMAKER INSERTION N/A 02/20/2013   Procedure: PERMANENT PACEMAKER INSERTION;  Surgeon: Sanda Klein, MD;  Location: [facility];  Service: Cardiovascular;  Laterality: N/A;  . POSTERIOR LUMBAR FUSION  ~ 2009  . TRANSTHORACIC ECHOCARDIOGRAM  07/11/2010   The left atrial size is  normal.There is no evidence of mitral vavle prolaspe.Right ventriicular systolic pressure is normal . Injection of contrast documented no interatrial shunt. Essentially normal 2D echo -doppler study     Current Medications: Outpatient Medications Prior to Visit  Medication Sig Dispense Refill  . ALPRAZolam (XANAX) 1 MG tablet alprazolam 1 mg tablet  TAKE 1 TABLET BY MOUTH 4 TIMES A DAY    . mirtazapine (REMERON) 30 MG tablet Take 30 mg by mouth at bedtime.    Marland Kitchen omeprazole (PRILOSEC) 40 MG capsule Take 40 mg by mouth 2 (two) times daily.  5  . risperiDONE (RISPERDAL) 2 MG tablet Take 1 tablet by mouth daily.    . sertraline (ZOLOFT) 100 MG tablet Take 100 mg by mouth daily.    Marland Kitchen ALPRAZolam (XANAX) 0.5 MG tablet Take 1 mg by mouth 2 (two) times daily as needed.     . risperiDONE (RISPERDAL) 1 MG tablet Take 1 mg by mouth at bedtime.     No facility-administered medications prior to visit.      Allergies:   Penicillins and Prednisone   Social History   Socioeconomic History  . Marital status: Married    Spouse name: Not on file  . Number of children: Not on file  . Years of education: Not on file  . Highest education level: Not on file  Occupational History  . Not on file  Social Needs  . Financial resource strain: Not on file  . Food insecurity:    Worry: Not on file    Inability: Not on file  . Transportation needs:    Medical: Not on file    Non-medical: Not on file  Tobacco Use  . Smoking status: Current Every Day Smoker    Packs/day: 1.00    Years: 40.00    Pack years: 40.00    Types: Cigarettes  . Smokeless tobacco: Never Used  Substance and Sexual Activity  . Alcohol use: No  . Drug use: No  . Sexual activity: Yes  Lifestyle  . Physical activity:    Days per week: Not on file    Minutes per session: Not on file  . Stress: Not on file  Relationships  . Social connections:    Talks on phone: Not on file    Gets together: Not on file    Attends religious  service: Not on file    Active member of club or organization: Not on file    Attends meetings of clubs or organizations: Not on file    Relationship status: Not on file  Other Topics Concern  . Not on file  Social History Narrative   Married 29 years . Has 3 children. Current smoker -1 pack a day-for 36 years .Alcohol :  1 beer on occasion.   She is soon to be a grandmother.  His before to going on a trip up to the [place] to be closer to family when the baby is born.  She is extremely excited.   She  very much would like to move back up to that area to be closer to family, but the whether is it just too cold in the winter.     Family History:  The patient's family history includes Heart attack in her brother. She was adopted.  one daughter is a mutation carrier for ARVC, the other has inappropriate sinus tachycardia   ROS:   Please see the history of present illness.    ROS All other systems reviewed and are negative.   PHYSICAL EXAM:   VS:  BP 104/78   Pulse 82   Ht 5\' 7"  (1.702 m)   Wt 122 lb 3.2 oz (55.4 kg)   BMI 19.14 kg/m     General: Alert, oriented x3, no distress, very lean and rather fragile appearing.  Healthy left subclavian pacemaker site Head: no evidence of trauma, PERRL, EOMI, no exophtalmos or lid lag, no myxedema, no xanthelasma; normal ears, nose and oropharynx Neck: normal jugular venous pulsations and no hepatojugular reflux; brisk carotid pulses without delay and no carotid bruits Chest: clear to auscultation, no signs of consolidation by percussion or palpation, normal fremitus, symmetrical and full respiratory excursions Cardiovascular: normal position and quality of the apical impulse, regular rhythm, normal first and second heart sounds, no murmurs, rubs or gallops Abdomen: no tenderness or distention, no masses by palpation, no abnormal pulsatility or arterial bruits, normal bowel sounds, no hepatosplenomegaly Extremities: no clubbing,  cyanosis or edema; 2+ radial, ulnar and brachial pulses bilaterally; 2+ right femoral, posterior tibial and dorsalis pedis pulses; 2+ left femoral, posterior tibial and dorsalis pedis pulses; no subclavian or femoral bruits Neurological: grossly nonfocal Psych: Normal mood and affect   Wt Readings from Last 3 Encounters:  06/25/17 122 lb 3.2 oz (55.4 kg)  06/22/16 117 lb 3.2 oz (53.2 kg)  12/09/15 104 lb 3.2 oz (47.3 kg)      Studies/Labs Reviewed:   EKG:  EKG is ordered today.  The ekg ordered today demonstrates NSR, nonspecific T wave changes in leads V3-V4, 3, F.  Normal QTC 415 ms.  The nonspecific T wave changes have been seen on and off on her previous ECGs.   ASSESSMENT:    1. Pacemaker   2. Sinus pause   3. Shortness of breath   4. Smoking      PLAN:  In order of problems listed above:  1. Syncope: I believe neurally mediated/cardioinhibitory. Loop recorder revealed several episodes of symptomatic pauses prior to pacemaker implantation. Syncope completely abolished after pacemaker implantation. 2. PM: We'll pacemaker function. Over the last year we have not received a remote downloads. Need to make sure these continue every 3 months. 3. Dyspnea: We will start with an echocardiogram.  Need to keep in mind that she is at risk for development of coronary disease with a roughly 30-pack-year history of smoking.  Does not have any complaints that suggest angina pectoris. 4. Strongly encouraged to quit smoking.    Medication Adjustments/Labs and Tests Ordered: Current medicines are reviewed at length with the patient today.  Concerns regarding medicines are outlined above.  Medication changes, Labs and Tests ordered today are listed in the Patient Instructions below. Patient Instructions  Dr Sallyanne Kuster recommends that you continue on your current medications as directed. Please refer to the Current Medication list given to you today.  Your physician has requested that you have  an echocardiogram. Echocardiography is a painless test that uses sound waves to create images of your heart. It provides your doctor  with information about the size and shape of your heart and how well your heart's chambers and valves are working. This procedure takes approximately one hour. There are no restrictions for this procedure. >>This will be performed at our [facility] location [place], [place] [address] [phone]  Remote monitoring is used to monitor your Pacemaker or ICD from home. This monitoring reduces the number of office visits required to check your device to one time per year. It allows [place] to keep an eye on the functioning of your device to ensure it is working properly. You are scheduled for a device check from home on Tuesday, July 9th, 2019. You may send your transmission at any time that day. If you have a wireless device, the transmission will be sent automatically. After your physician reviews your transmission, you will receive a notification with your next transmission date.  Dr Sallyanne Kuster recommends that you schedule a follow-up appointment in 12 months with a pacemaker check. You will receive a reminder letter in the mail two months in advance. If you don't receive a letter, please call our office to schedule the follow-up appointment.  If you need a refill on your cardiac medications before your next appointment, please call your pharmacy.    Signed, Sanda Klein, MD  06/30/2017 6:59 PM    [facility] [place], [place], [place]  [address] Phone: [phone]; Fax: [phone]

## 2017-06-27 ENCOUNTER — Encounter: Payer: Self-pay | Admitting: Cardiology

## 2017-06-27 NOTE — Progress Notes (Signed)
Remote pacemaker transmission.   

## 2017-06-30 ENCOUNTER — Encounter: Payer: Self-pay | Admitting: Cardiovascular Disease

## 2017-07-09 ENCOUNTER — Other Ambulatory Visit: Payer: Self-pay

## 2017-07-09 ENCOUNTER — Ambulatory Visit (HOSPITAL_COMMUNITY): Payer: Medicare HMO | Attending: Cardiovascular Disease

## 2017-07-09 DIAGNOSIS — R0602 Shortness of breath: Secondary | ICD-10-CM

## 2017-07-09 DIAGNOSIS — I071 Rheumatic tricuspid insufficiency: Secondary | ICD-10-CM | POA: Diagnosis not present

## 2017-07-09 DIAGNOSIS — R0600 Dyspnea, unspecified: Secondary | ICD-10-CM | POA: Diagnosis present

## 2017-07-09 DIAGNOSIS — Z720 Tobacco use: Secondary | ICD-10-CM | POA: Insufficient documentation

## 2017-07-10 DIAGNOSIS — M5416 Radiculopathy, lumbar region: Secondary | ICD-10-CM | POA: Diagnosis not present

## 2017-07-15 DIAGNOSIS — M5416 Radiculopathy, lumbar region: Secondary | ICD-10-CM | POA: Diagnosis not present

## 2017-07-17 LAB — CUP PACEART REMOTE DEVICE CHECK
Battery Impedance: 184 Ohm
Battery Remaining Longevity: 149 mo
Battery Voltage: 2.79 V
Brady Statistic AP VP Percent: 0 %
Brady Statistic AP VS Percent: 0 %
Brady Statistic AS VP Percent: 1 %
Brady Statistic AS VS Percent: 99 %
Date Time Interrogation Session: 20190409150312
Implantable Lead Implant Date: 20141205
Implantable Lead Implant Date: 20141205
Implantable Lead Location: 753859
Implantable Lead Location: 753860
Implantable Lead Model: 5076
Implantable Lead Model: 5076
Implantable Pulse Generator Implant Date: 20141205
Lead Channel Impedance Value: 452 Ohm
Lead Channel Impedance Value: 492 Ohm
Lead Channel Pacing Threshold Amplitude: 0.375 V
Lead Channel Pacing Threshold Amplitude: 1.625 V
Lead Channel Pacing Threshold Pulse Width: 0.4 ms
Lead Channel Pacing Threshold Pulse Width: 0.4 ms
Lead Channel Sensing Intrinsic Amplitude: 0.7 mV
Lead Channel Sensing Intrinsic Amplitude: 5.6 mV
Lead Channel Setting Pacing Amplitude: 2 V
Lead Channel Setting Pacing Amplitude: 3.25 V
Lead Channel Setting Pacing Pulse Width: 0.4 ms
Lead Channel Setting Sensing Sensitivity: 2 mV

## 2017-07-22 DIAGNOSIS — M5416 Radiculopathy, lumbar region: Secondary | ICD-10-CM | POA: Diagnosis not present

## 2017-07-25 DIAGNOSIS — M5416 Radiculopathy, lumbar region: Secondary | ICD-10-CM | POA: Diagnosis not present

## 2017-07-29 DIAGNOSIS — M5416 Radiculopathy, lumbar region: Secondary | ICD-10-CM | POA: Diagnosis not present

## 2017-08-08 DIAGNOSIS — R69 Illness, unspecified: Secondary | ICD-10-CM | POA: Diagnosis not present

## 2017-08-09 DIAGNOSIS — L299 Pruritus, unspecified: Secondary | ICD-10-CM | POA: Diagnosis not present

## 2017-08-09 DIAGNOSIS — M5416 Radiculopathy, lumbar region: Secondary | ICD-10-CM | POA: Diagnosis not present

## 2017-08-09 DIAGNOSIS — S1096XA Insect bite of unspecified part of neck, initial encounter: Secondary | ICD-10-CM | POA: Diagnosis not present

## 2017-08-09 DIAGNOSIS — W57XXXA Bitten or stung by nonvenomous insect and other nonvenomous arthropods, initial encounter: Secondary | ICD-10-CM | POA: Diagnosis not present

## 2017-08-13 DIAGNOSIS — M5416 Radiculopathy, lumbar region: Secondary | ICD-10-CM | POA: Diagnosis not present

## 2017-08-13 DIAGNOSIS — R69 Illness, unspecified: Secondary | ICD-10-CM | POA: Diagnosis not present

## 2017-08-15 DIAGNOSIS — L239 Allergic contact dermatitis, unspecified cause: Secondary | ICD-10-CM | POA: Diagnosis not present

## 2017-08-22 DIAGNOSIS — M545 Low back pain: Secondary | ICD-10-CM | POA: Diagnosis not present

## 2017-08-22 DIAGNOSIS — G894 Chronic pain syndrome: Secondary | ICD-10-CM | POA: Diagnosis not present

## 2017-09-03 DIAGNOSIS — R69 Illness, unspecified: Secondary | ICD-10-CM | POA: Diagnosis not present

## 2017-09-10 DIAGNOSIS — M961 Postlaminectomy syndrome, not elsewhere classified: Secondary | ICD-10-CM | POA: Diagnosis not present

## 2017-09-10 DIAGNOSIS — M5416 Radiculopathy, lumbar region: Secondary | ICD-10-CM | POA: Diagnosis not present

## 2017-09-24 ENCOUNTER — Telehealth: Payer: Self-pay | Admitting: Cardiology

## 2017-09-24 ENCOUNTER — Ambulatory Visit (INDEPENDENT_AMBULATORY_CARE_PROVIDER_SITE_OTHER): Payer: Medicare HMO | Admitting: *Deleted

## 2017-09-24 DIAGNOSIS — I455 Other specified heart block: Secondary | ICD-10-CM | POA: Diagnosis not present

## 2017-09-24 NOTE — Telephone Encounter (Signed)
Spoke with pt and reminded pt of remote transmission that is due today. Pt verbalized understanding.   

## 2017-09-25 NOTE — Progress Notes (Signed)
Remote pacemaker transmission.   

## 2017-10-08 DIAGNOSIS — K219 Gastro-esophageal reflux disease without esophagitis: Secondary | ICD-10-CM | POA: Diagnosis not present

## 2017-10-08 DIAGNOSIS — Z681 Body mass index (BMI) 19 or less, adult: Secondary | ICD-10-CM | POA: Diagnosis not present

## 2017-10-08 DIAGNOSIS — R636 Underweight: Secondary | ICD-10-CM | POA: Diagnosis not present

## 2017-10-08 DIAGNOSIS — R69 Illness, unspecified: Secondary | ICD-10-CM | POA: Diagnosis not present

## 2017-10-08 DIAGNOSIS — F419 Anxiety disorder, unspecified: Secondary | ICD-10-CM | POA: Diagnosis not present

## 2017-10-08 DIAGNOSIS — G4700 Insomnia, unspecified: Secondary | ICD-10-CM | POA: Diagnosis not present

## 2017-10-08 DIAGNOSIS — G8929 Other chronic pain: Secondary | ICD-10-CM | POA: Diagnosis not present

## 2017-10-08 DIAGNOSIS — Z720 Tobacco use: Secondary | ICD-10-CM | POA: Diagnosis not present

## 2017-10-08 DIAGNOSIS — Z809 Family history of malignant neoplasm, unspecified: Secondary | ICD-10-CM | POA: Diagnosis not present

## 2017-10-08 DIAGNOSIS — L299 Pruritus, unspecified: Secondary | ICD-10-CM | POA: Diagnosis not present

## 2017-10-09 LAB — CUP PACEART REMOTE DEVICE CHECK
Battery Impedance: 208 Ohm
Battery Remaining Longevity: 136 mo
Battery Voltage: 2.79 V
Brady Statistic AP VP Percent: 0 %
Brady Statistic AP VS Percent: 0 %
Brady Statistic AS VP Percent: 1 %
Brady Statistic AS VS Percent: 99 %
Date Time Interrogation Session: 20190709183113
Implantable Lead Implant Date: 20141205
Implantable Lead Implant Date: 20141205
Implantable Lead Location: 753859
Implantable Lead Location: 753860
Implantable Lead Model: 5076
Implantable Lead Model: 5076
Implantable Pulse Generator Implant Date: 20141205
Lead Channel Impedance Value: 496 Ohm
Lead Channel Impedance Value: 508 Ohm
Lead Channel Pacing Threshold Amplitude: 0.5 V
Lead Channel Pacing Threshold Amplitude: 1.125 V
Lead Channel Pacing Threshold Pulse Width: 0.4 ms
Lead Channel Pacing Threshold Pulse Width: 0.4 ms
Lead Channel Setting Pacing Amplitude: 2 V
Lead Channel Setting Pacing Amplitude: 2.5 V
Lead Channel Setting Pacing Pulse Width: 0.4 ms
Lead Channel Setting Sensing Sensitivity: 2 mV

## 2017-11-13 DIAGNOSIS — R69 Illness, unspecified: Secondary | ICD-10-CM | POA: Diagnosis not present

## 2017-11-13 DIAGNOSIS — Z136 Encounter for screening for cardiovascular disorders: Secondary | ICD-10-CM | POA: Diagnosis not present

## 2017-11-13 DIAGNOSIS — R7301 Impaired fasting glucose: Secondary | ICD-10-CM | POA: Diagnosis not present

## 2017-11-13 DIAGNOSIS — Z0000 Encounter for general adult medical examination without abnormal findings: Secondary | ICD-10-CM | POA: Diagnosis not present

## 2017-11-13 DIAGNOSIS — E782 Mixed hyperlipidemia: Secondary | ICD-10-CM | POA: Diagnosis not present

## 2017-11-13 DIAGNOSIS — Z23 Encounter for immunization: Secondary | ICD-10-CM | POA: Diagnosis not present

## 2017-11-13 DIAGNOSIS — G4700 Insomnia, unspecified: Secondary | ICD-10-CM | POA: Diagnosis not present

## 2017-11-14 DIAGNOSIS — R69 Illness, unspecified: Secondary | ICD-10-CM | POA: Diagnosis not present

## 2017-11-25 ENCOUNTER — Ambulatory Visit (INDEPENDENT_AMBULATORY_CARE_PROVIDER_SITE_OTHER)
Admission: RE | Admit: 2017-11-25 | Discharge: 2017-11-25 | Disposition: A | Discharge status: Home / Self Care | Payer: Medicare HMO | Source: Ambulatory Visit | Attending: Acute Care | Admitting: Acute Care

## 2017-11-25 DIAGNOSIS — F17210 Nicotine dependence, cigarettes, uncomplicated: Secondary | ICD-10-CM | POA: Diagnosis not present

## 2017-11-25 DIAGNOSIS — R69 Illness, unspecified: Secondary | ICD-10-CM | POA: Diagnosis not present

## 2017-11-25 DIAGNOSIS — Z122 Encounter for screening for malignant neoplasm of respiratory organs: Secondary | ICD-10-CM

## 2017-11-28 ENCOUNTER — Other Ambulatory Visit: Payer: Self-pay | Admitting: Acute Care

## 2017-11-28 DIAGNOSIS — Z122 Encounter for screening for malignant neoplasm of respiratory organs: Secondary | ICD-10-CM

## 2017-11-28 DIAGNOSIS — F17210 Nicotine dependence, cigarettes, uncomplicated: Secondary | ICD-10-CM

## 2017-12-19 ENCOUNTER — Ambulatory Visit: Payer: Medicare HMO | Admitting: Psychiatry

## 2017-12-19 DIAGNOSIS — R69 Illness, unspecified: Secondary | ICD-10-CM | POA: Diagnosis not present

## 2017-12-19 DIAGNOSIS — M5416 Radiculopathy, lumbar region: Secondary | ICD-10-CM | POA: Diagnosis not present

## 2017-12-19 DIAGNOSIS — M4326 Fusion of spine, lumbar region: Secondary | ICD-10-CM | POA: Diagnosis not present

## 2017-12-19 DIAGNOSIS — F3341 Major depressive disorder, recurrent, in partial remission: Secondary | ICD-10-CM | POA: Diagnosis not present

## 2017-12-19 DIAGNOSIS — G4709 Other insomnia: Secondary | ICD-10-CM | POA: Diagnosis not present

## 2017-12-19 MED ORDER — SERTRALINE HCL 100 MG PO TABS: 100.0000 mg | ORAL_TABLET | Freq: Two times a day (BID) | ORAL | 3 refills | Status: DC

## 2017-12-19 MED ORDER — RISPERIDONE 2 MG PO TABS: 1.0000 mg | ORAL_TABLET | Freq: Every day | ORAL | 3 refills | Status: DC

## 2017-12-19 MED ORDER — HYDROXYZINE PAMOATE 25 MG PO CAPS: 25.0000 mg | ORAL_CAPSULE | Freq: Three times a day (TID) | ORAL | 0 refills | Status: DC | PRN

## 2017-12-19 MED ORDER — MIRTAZAPINE 45 MG PO TABS: 67.0000 mg | ORAL_TABLET | Freq: Every day | ORAL | 2 refills | Status: DC

## 2017-12-19 MED ORDER — ALPRAZOLAM 1 MG PO TABS: ORAL_TABLET | ORAL | 2 refills | Status: DC

## 2017-12-19 MED ORDER — RISPERIDONE 2 MG PO TABS: 1.0000 mg | ORAL_TABLET | Freq: Two times a day (BID) | ORAL | 3 refills | Status: DC

## 2017-12-19 NOTE — Progress Notes (Signed)
[facility]  Patient ID: [name],  MRN: [id number]  PCP: Janifer Adie, MD  Date of Evaluation: 12/19/2017 Time spent:20 minutes   HISTORY/CURRENT STATUS: HPI patient is a 62 year old white female seen 1 month ago.  The diagnosis of insomnia and depression.  Insomnia was her major complaint.  At that time we increased her Vistaril and her Remeron.  Patient was taking Remeron melatonin and 1-1/2 mg of Xanax at bed if she woke up again she took another 1-1/2 Xanax.  Patient has had success with Remeron and is been able to reduce her Xanax to 1 tab 4 times daily.  Individual Medical History/ Review of Systems: Changes? :No  Allergies: Penicillins and Prednisone  Current Medications:  Current Outpatient Medications:  .  ALPRAZolam (XANAX) 1 MG tablet, alprazolam 1 mg tablet  TAKE 1 TABLET BY MOUTH 4 TIMES A DAY, Disp: , Rfl:  .  mirtazapine (REMERON) 30 MG tablet, Take 67 mg by mouth at bedtime. 1 and 1/2 tabs hs, Disp: , Rfl:  .  omeprazole (PRILOSEC) 40 MG capsule, Take 40 mg by mouth 2 (two) times daily., Disp: , Rfl: 5 .  sertraline (ZOLOFT) 100 MG tablet, Take 200 mg by mouth daily., Disp: , Rfl:  .  hydrOXYzine (VISTARIL) 25 MG capsule, Take 1 capsule (25 mg total) by mouth 3 (three) times daily as needed., Disp: 30 capsule, Rfl: 0 .  mirtazapine (REMERON) 45 MG tablet, Take 1.5 tablets (67 mg total) by mouth at bedtime., Disp: 45 tablet, Rfl: 2 .  risperiDONE (RISPERDAL) 2 MG tablet, Take 0.5 tablets (1 mg total) by mouth 2 (two) times daily., Disp: 30 tablet, Rfl: 3 .  sertraline (ZOLOFT) 100 MG tablet, Take 1 tablet (100 mg total) by mouth 2 (two) times daily., Disp: 60 tablet, Rfl: 3 Medication Side Effects: None  Family Medical/ Social History: Changes? No  MENTAL HEALTH EXAM:  There were no vitals taken for this visit.There is no height or weight on file to calculate BMI.  General Appearance: Casual  Eye Contact:  Good  Speech:  Clear and Coherent   Volume:  Normal  Mood:  Depressed  Affect:  Appropriate  Thought Process:  Linear  Orientation:  Full (Time, Place, and Person)  Thought Content: WDL   Suicidal Thoughts:  No  Homicidal Thoughts:  No  Memory:  Immediate  Judgement:  Good  Insight:  Good  Psychomotor Activity:  Normal  Concentration:  Concentration: Good  Recall:  Good  Fund of Knowledge: Good  Language: Good  Akathisia:  NA  AIMS (if indicated): na  Assets:  Desire for Improvement  ADL's:  Intact  Cognition: WNL  Prognosis:  Good    DIAGNOSES:    ICD-10-CM   1. Other insomnia G47.09 hydrOXYzine (VISTARIL) 25 MG capsule    mirtazapine (REMERON) 45 MG tablet    sertraline (ZOLOFT) 100 MG tablet    DISCONTINUED: risperiDONE (RISPERDAL) 2 MG tablet  2. Recurrent major depressive disorder, in partial remission ([place]) F33.41     RECOMMENDATIONS: Patient is to continue her same medications and return in 3 months.  She will call if she has any problems.    Comer Locket, PA-C

## 2017-12-20 DIAGNOSIS — G894 Chronic pain syndrome: Secondary | ICD-10-CM | POA: Diagnosis not present

## 2017-12-20 DIAGNOSIS — M545 Low back pain: Secondary | ICD-10-CM | POA: Diagnosis not present

## 2017-12-20 DIAGNOSIS — M5416 Radiculopathy, lumbar region: Secondary | ICD-10-CM | POA: Diagnosis not present

## 2017-12-23 ENCOUNTER — Other Ambulatory Visit: Payer: Self-pay

## 2017-12-24 ENCOUNTER — Ambulatory Visit (INDEPENDENT_AMBULATORY_CARE_PROVIDER_SITE_OTHER): Payer: Medicare HMO | Admitting: *Deleted

## 2017-12-24 ENCOUNTER — Telehealth: Payer: Self-pay | Admitting: Cardiology

## 2017-12-24 DIAGNOSIS — I495 Sick sinus syndrome: Secondary | ICD-10-CM

## 2017-12-24 NOTE — Telephone Encounter (Signed)
LMOVM reminding pt to send remote transmission.   

## 2017-12-25 NOTE — Progress Notes (Signed)
Remote pacemaker transmission.   

## 2017-12-30 DIAGNOSIS — H26491 Other secondary cataract, right eye: Secondary | ICD-10-CM | POA: Diagnosis not present

## 2017-12-30 DIAGNOSIS — R69 Illness, unspecified: Secondary | ICD-10-CM | POA: Diagnosis not present

## 2017-12-30 DIAGNOSIS — H26492 Other secondary cataract, left eye: Secondary | ICD-10-CM | POA: Diagnosis not present

## 2018-01-01 ENCOUNTER — Encounter: Payer: Self-pay | Admitting: Cardiology

## 2018-01-07 DIAGNOSIS — M5416 Radiculopathy, lumbar region: Secondary | ICD-10-CM | POA: Diagnosis not present

## 2018-01-07 DIAGNOSIS — M961 Postlaminectomy syndrome, not elsewhere classified: Secondary | ICD-10-CM | POA: Diagnosis not present

## 2018-01-20 LAB — CUP PACEART REMOTE DEVICE CHECK
Battery Impedance: 232 Ohm
Battery Remaining Longevity: 132 mo
Battery Voltage: 2.79 V
Brady Statistic AP VP Percent: 0 %
Brady Statistic AP VS Percent: 0 %
Brady Statistic AS VP Percent: 1 %
Brady Statistic AS VS Percent: 99 %
Date Time Interrogation Session: 20191008201955
Implantable Lead Implant Date: 20141205
Implantable Lead Implant Date: 20141205
Implantable Lead Location: 753859
Implantable Lead Location: 753860
Implantable Lead Model: 5076
Implantable Lead Model: 5076
Implantable Pulse Generator Implant Date: 20141205
Lead Channel Impedance Value: 466 Ohm
Lead Channel Impedance Value: 478 Ohm
Lead Channel Pacing Threshold Amplitude: 0.375 V
Lead Channel Pacing Threshold Amplitude: 1 V
Lead Channel Pacing Threshold Pulse Width: 0.4 ms
Lead Channel Pacing Threshold Pulse Width: 0.4 ms
Lead Channel Setting Pacing Amplitude: 2 V
Lead Channel Setting Pacing Amplitude: 2.5 V
Lead Channel Setting Pacing Pulse Width: 0.4 ms
Lead Channel Setting Sensing Sensitivity: 2 mV

## 2018-01-31 ENCOUNTER — Other Ambulatory Visit: Payer: Self-pay

## 2018-01-31 MED ORDER — RISPERIDONE 2 MG PO TABS: 1.0000 mg | ORAL_TABLET | Freq: Two times a day (BID) | ORAL | 1 refills | Status: DC

## 2018-02-11 ENCOUNTER — Telehealth: Payer: Self-pay | Admitting: Psychiatry

## 2018-02-11 ENCOUNTER — Other Ambulatory Visit: Payer: Self-pay | Admitting: Psychiatry

## 2018-02-11 MED ORDER — DIAZEPAM 5 MG PO TABS: ORAL_TABLET | ORAL | 0 refills | Status: DC

## 2018-02-11 NOTE — Telephone Encounter (Signed)
Pt on xanax 1mg  qid. Will ok limited supply of valium. Not to take xanax at same time

## 2018-02-11 NOTE — Telephone Encounter (Signed)
Was prescribed Valium in the past for when she is traveling. Taking a flight soon. Please prescribe for my upcoming trip.

## 2018-02-11 NOTE — Progress Notes (Signed)
eprescribed 6 valium, 1 prn travel, don't take with xanax

## 2018-03-04 DIAGNOSIS — M5416 Radiculopathy, lumbar region: Secondary | ICD-10-CM | POA: Diagnosis not present

## 2018-03-07 DIAGNOSIS — D123 Benign neoplasm of transverse colon: Secondary | ICD-10-CM | POA: Diagnosis not present

## 2018-03-07 DIAGNOSIS — K5730 Diverticulosis of large intestine without perforation or abscess without bleeding: Secondary | ICD-10-CM | POA: Diagnosis not present

## 2018-03-07 DIAGNOSIS — K648 Other hemorrhoids: Secondary | ICD-10-CM | POA: Diagnosis not present

## 2018-03-07 DIAGNOSIS — Z86010 Personal history of colonic polyps: Secondary | ICD-10-CM | POA: Diagnosis not present

## 2018-03-11 DIAGNOSIS — D123 Benign neoplasm of transverse colon: Secondary | ICD-10-CM | POA: Diagnosis not present

## 2018-03-20 DIAGNOSIS — R05 Cough: Secondary | ICD-10-CM | POA: Diagnosis not present

## 2018-03-20 DIAGNOSIS — J069 Acute upper respiratory infection, unspecified: Secondary | ICD-10-CM | POA: Diagnosis not present

## 2018-03-25 ENCOUNTER — Ambulatory Visit: Payer: Medicare HMO | Admitting: Psychiatry

## 2018-03-25 ENCOUNTER — Ambulatory Visit (INDEPENDENT_AMBULATORY_CARE_PROVIDER_SITE_OTHER): Payer: Medicare HMO

## 2018-03-25 DIAGNOSIS — F909 Attention-deficit hyperactivity disorder, unspecified type: Secondary | ICD-10-CM

## 2018-03-25 DIAGNOSIS — I495 Sick sinus syndrome: Secondary | ICD-10-CM | POA: Diagnosis not present

## 2018-03-25 DIAGNOSIS — F39 Unspecified mood [affective] disorder: Secondary | ICD-10-CM

## 2018-03-25 DIAGNOSIS — G4700 Insomnia, unspecified: Secondary | ICD-10-CM

## 2018-03-25 DIAGNOSIS — G4709 Other insomnia: Secondary | ICD-10-CM | POA: Diagnosis not present

## 2018-03-25 DIAGNOSIS — F3341 Major depressive disorder, recurrent, in partial remission: Secondary | ICD-10-CM | POA: Diagnosis not present

## 2018-03-25 DIAGNOSIS — R69 Illness, unspecified: Secondary | ICD-10-CM | POA: Diagnosis not present

## 2018-03-25 MED ORDER — MIRTAZAPINE 45 MG PO TABS: 67.0000 mg | ORAL_TABLET | Freq: Every day | ORAL | 2 refills | Status: DC

## 2018-03-25 MED ORDER — ALPRAZOLAM 1 MG PO TABS: ORAL_TABLET | ORAL | 2 refills | Status: DC

## 2018-03-25 MED ORDER — RISPERIDONE 2 MG PO TABS: 1.0000 mg | ORAL_TABLET | Freq: Two times a day (BID) | ORAL | 2 refills | Status: DC

## 2018-03-25 MED ORDER — SERTRALINE HCL 100 MG PO TABS: 200.0000 mg | ORAL_TABLET | Freq: Every day | ORAL | 2 refills | Status: DC

## 2018-03-25 MED ORDER — HYDROXYZINE PAMOATE 25 MG PO CAPS: 25.0000 mg | ORAL_CAPSULE | Freq: Three times a day (TID) | ORAL | 0 refills | Status: DC | PRN

## 2018-03-25 NOTE — Progress Notes (Signed)
[facility]  Patient ID: [name],  MRN: [id number]  PCP: Janifer Adie, MD  Date of Evaluation: 03/25/2018 Time spent:20 minutes  Chief Complaint:   HISTORY/CURRENT STATUS: HPI patient was seen 12/19/2017.  She was doing about the same.  No change in medications. He continues to do well.  Her insomnia is better. Individual Medical History/ Review of Systems: Changes? :No   Allergies: Penicillins and Prednisone  Current Medications:  Current Outpatient Medications:  .  ALPRAZolam (XANAX) 1 MG tablet, alprazolam 1 mg tablet  TAKE 1 TABLET BY MOUTH 4 TIMES A DAY, Disp: 120 tablet, Rfl: 2 .  hydrOXYzine (VISTARIL) 25 MG capsule, Take 1 capsule (25 mg total) by mouth 3 (three) times daily as needed., Disp: 30 capsule, Rfl: 0 .  mirtazapine (REMERON) 45 MG tablet, Take 1.5 tablets (67 mg total) by mouth at bedtime., Disp: 45 tablet, Rfl: 2 .  risperiDONE (RISPERDAL) 2 MG tablet, Take 0.5 tablets (1 mg total) by mouth 2 (two) times daily., Disp: 30 tablet, Rfl: 2 .  sertraline (ZOLOFT) 100 MG tablet, Take 2 tablets (200 mg total) by mouth daily., Disp: 60 tablet, Rfl: 2 .  diazepam (VALIUM) 5 MG tablet, 1 prn travel, don't take with xanax (Patient not taking: Reported on 03/25/2018), Disp: 6 tablet, Rfl: 0 .  mirtazapine (REMERON) 30 MG tablet, Take 67 mg by mouth at bedtime. 1 and 1/2 tabs hs, Disp: , Rfl:  .  omeprazole (PRILOSEC) 40 MG capsule, Take 40 mg by mouth 2 (two) times daily., Disp: , Rfl: 5 .  sertraline (ZOLOFT) 100 MG tablet, Take 1 tablet (100 mg total) by mouth 2 (two) times daily. (Patient not taking: Reported on 03/25/2018), Disp: 60 tablet, Rfl: 3 Medication Side Effects: none  Family Medical/ Social History: Changes? No  MENTAL HEALTH EXAM:  There were no vitals taken for this visit.There is no height or weight on file to calculate BMI.  General Appearance: Casual  Eye Contact:  Good  Speech:  Clear and Coherent  Volume:  Normal  Mood:  Euthymic   Affect:  Appropriate  Thought Process:  Linear  Orientation:  Full (Time, Place, and Person)  Thought Content: Logical   Suicidal Thoughts:  No  Homicidal Thoughts:  No  Memory:  WNL  Judgement:  Good  Insight:  Good  Psychomotor Activity:  Normal  Concentration:  Concentration: Good  Recall:  Good  Fund of Knowledge: Good  Language: Good  Assets:  Desire for Improvement  ADL's:  Intact  Cognition: WNL  Prognosis:  Good    DIAGNOSES:    ICD-10-CM   1. Episodic mood disorder (HCC) F39   2. Attention deficit hyperactivity disorder (ADHD), unspecified ADHD type F90.9   3. Insomnia, unspecified type G47.00   4. Recurrent major depressive disorder, in partial remission (HCC) F33.41 ALPRAZolam (XANAX) 1 MG tablet  5. Other insomnia G47.09 hydrOXYzine (VISTARIL) 25 MG capsule    mirtazapine (REMERON) 45 MG tablet    Receiving Psychotherapy: No    RECOMMENDATIONS: We will keep patient's medications the same.  Patient has 1 more medication to treat tremors  that she cannot remember so she will call us with that medication as it is not on her chart. Return in 3 months.   Comer Locket, PA-C

## 2018-03-26 LAB — CUP PACEART REMOTE DEVICE CHECK
Battery Impedance: 232 Ohm
Battery Remaining Longevity: 139 mo
Battery Voltage: 2.79 V
Brady Statistic AP VP Percent: 0 %
Brady Statistic AP VS Percent: 0 %
Brady Statistic AS VP Percent: 1 %
Brady Statistic AS VS Percent: 99 %
Date Time Interrogation Session: 20200108172136
Implantable Lead Implant Date: 20141205
Implantable Lead Implant Date: 20141205
Implantable Lead Location: 753859
Implantable Lead Location: 753860
Implantable Lead Model: 5076
Implantable Lead Model: 5076
Implantable Pulse Generator Implant Date: 20141205
Lead Channel Impedance Value: 454 Ohm
Lead Channel Impedance Value: 493 Ohm
Lead Channel Pacing Threshold Amplitude: 0.375 V
Lead Channel Pacing Threshold Amplitude: 1.75 V
Lead Channel Pacing Threshold Pulse Width: 0.4 ms
Lead Channel Pacing Threshold Pulse Width: 0.4 ms
Lead Channel Setting Pacing Amplitude: 2 V
Lead Channel Setting Pacing Amplitude: 3.5 V
Lead Channel Setting Pacing Pulse Width: 0.4 ms
Lead Channel Setting Sensing Sensitivity: 2 mV

## 2018-03-26 NOTE — Progress Notes (Signed)
Remote pacemaker transmission.   

## 2018-03-28 ENCOUNTER — Telehealth: Payer: Self-pay

## 2018-03-28 NOTE — Telephone Encounter (Signed)
Prior Auth approved for Mirtazapine 45 mg, 135 tabs per 90 days, from 03/19/2018-03/19/2019.

## 2018-04-16 ENCOUNTER — Encounter: Payer: Self-pay | Admitting: Emergency Medicine

## 2018-04-16 DIAGNOSIS — G4700 Insomnia, unspecified: Secondary | ICD-10-CM

## 2018-04-16 DIAGNOSIS — F909 Attention-deficit hyperactivity disorder, unspecified type: Secondary | ICD-10-CM | POA: Insufficient documentation

## 2018-04-16 DIAGNOSIS — F319 Bipolar disorder, unspecified: Secondary | ICD-10-CM | POA: Insufficient documentation

## 2018-04-16 DIAGNOSIS — F39 Unspecified mood [affective] disorder: Secondary | ICD-10-CM

## 2018-04-18 ENCOUNTER — Other Ambulatory Visit: Payer: Self-pay | Admitting: Psychiatry

## 2018-04-18 DIAGNOSIS — G4709 Other insomnia: Secondary | ICD-10-CM

## 2018-04-18 MED ORDER — MIRTAZAPINE 45 MG PO TABS: ORAL_TABLET | ORAL | 0 refills | Status: DC

## 2018-05-19 DIAGNOSIS — F17210 Nicotine dependence, cigarettes, uncomplicated: Secondary | ICD-10-CM | POA: Diagnosis not present

## 2018-05-19 DIAGNOSIS — K219 Gastro-esophageal reflux disease without esophagitis: Secondary | ICD-10-CM | POA: Diagnosis not present

## 2018-05-19 DIAGNOSIS — Z972 Presence of dental prosthetic device (complete) (partial): Secondary | ICD-10-CM | POA: Diagnosis not present

## 2018-05-19 DIAGNOSIS — R69 Illness, unspecified: Secondary | ICD-10-CM | POA: Diagnosis not present

## 2018-05-28 ENCOUNTER — Other Ambulatory Visit: Payer: Self-pay | Admitting: Psychiatry

## 2018-06-05 ENCOUNTER — Other Ambulatory Visit: Payer: Self-pay

## 2018-06-05 MED ORDER — MIRTAZAPINE 45 MG PO TABS: ORAL_TABLET | ORAL | 0 refills | Status: DC

## 2018-06-13 ENCOUNTER — Other Ambulatory Visit: Payer: Self-pay

## 2018-06-13 MED ORDER — RISPERIDONE 2 MG PO TABS: 1.0000 mg | ORAL_TABLET | Freq: Two times a day (BID) | ORAL | 1 refills | Status: DC

## 2018-06-23 ENCOUNTER — Ambulatory Visit: Payer: Medicare HMO | Admitting: Psychiatry

## 2018-06-24 ENCOUNTER — Telehealth: Payer: Self-pay

## 2018-06-24 ENCOUNTER — Ambulatory Visit (INDEPENDENT_AMBULATORY_CARE_PROVIDER_SITE_OTHER): Payer: Medicare HMO | Admitting: *Deleted

## 2018-06-24 ENCOUNTER — Other Ambulatory Visit: Payer: Self-pay

## 2018-06-24 DIAGNOSIS — I455 Other specified heart block: Secondary | ICD-10-CM | POA: Diagnosis not present

## 2018-06-24 DIAGNOSIS — I495 Sick sinus syndrome: Secondary | ICD-10-CM

## 2018-06-24 LAB — CUP PACEART REMOTE DEVICE CHECK
Battery Impedance: 256 Ohm
Battery Remaining Longevity: 136 mo
Battery Voltage: 2.79 V
Brady Statistic AP VP Percent: 0 %
Brady Statistic AP VS Percent: 0 %
Brady Statistic AS VP Percent: 1 %
Brady Statistic AS VS Percent: 99 %
Date Time Interrogation Session: 20200407171145
Implantable Lead Implant Date: 20141205
Implantable Lead Implant Date: 20141205
Implantable Lead Location: 753859
Implantable Lead Location: 753860
Implantable Lead Model: 5076
Implantable Lead Model: 5076
Implantable Pulse Generator Implant Date: 20141205
Lead Channel Impedance Value: 406 Ohm
Lead Channel Impedance Value: 471 Ohm
Lead Channel Pacing Threshold Amplitude: 0.5 V
Lead Channel Pacing Threshold Amplitude: 1.375 V
Lead Channel Pacing Threshold Pulse Width: 0.4 ms
Lead Channel Pacing Threshold Pulse Width: 0.4 ms
Lead Channel Setting Pacing Amplitude: 2 V
Lead Channel Setting Pacing Amplitude: 2.75 V
Lead Channel Setting Pacing Pulse Width: 0.46 ms
Lead Channel Setting Sensing Sensitivity: 2 mV

## 2018-06-24 NOTE — Telephone Encounter (Signed)
Spoke with patient to remind of missed remote transmission 

## 2018-07-03 ENCOUNTER — Encounter: Payer: Self-pay | Admitting: Cardiology

## 2018-07-03 NOTE — Progress Notes (Signed)
Remote pacemaker transmission.   

## 2018-07-04 ENCOUNTER — Telehealth: Payer: Self-pay | Admitting: Psychiatry
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
## 2018-07-04 NOTE — Telephone Encounter (Signed)
It looks like she's taking Risperdal, 1 mg total.  Have her increase to 2 mg, qhs. Needs appt asap. Continue Xanax prn.

## 2018-07-10 ENCOUNTER — Ambulatory Visit (INDEPENDENT_AMBULATORY_CARE_PROVIDER_SITE_OTHER): Payer: Medicare HMO | Admitting: Physician Assistant

## 2018-07-10 ENCOUNTER — Other Ambulatory Visit: Payer: Self-pay | Admitting: Psychiatry

## 2018-07-10 ENCOUNTER — Telehealth: Payer: Self-pay | Admitting: Cardiovascular Disease

## 2018-07-10 ENCOUNTER — Other Ambulatory Visit: Payer: Self-pay

## 2018-07-10 ENCOUNTER — Encounter: Payer: Self-pay | Admitting: Physician Assistant

## 2018-07-10 DIAGNOSIS — F411 Generalized anxiety disorder: Secondary | ICD-10-CM | POA: Diagnosis not present

## 2018-07-10 DIAGNOSIS — F3341 Major depressive disorder, recurrent, in partial remission: Secondary | ICD-10-CM

## 2018-07-10 DIAGNOSIS — R69 Illness, unspecified: Secondary | ICD-10-CM | POA: Diagnosis not present

## 2018-07-10 DIAGNOSIS — G4709 Other insomnia: Secondary | ICD-10-CM | POA: Diagnosis not present

## 2018-07-10 MED ORDER — HYDROXYZINE PAMOATE 25 MG PO CAPS: 25.0000 mg | ORAL_CAPSULE | Freq: Three times a day (TID) | ORAL | 2 refills | Status: DC | PRN

## 2018-07-10 MED ORDER — SERTRALINE HCL 100 MG PO TABS: 200.0000 mg | ORAL_TABLET | Freq: Every day | ORAL | 2 refills | Status: DC

## 2018-07-10 MED ORDER — ALPRAZOLAM 1 MG PO TABS: ORAL_TABLET | ORAL | 2 refills | Status: DC

## 2018-07-10 MED ORDER — MIRTAZAPINE 45 MG PO TABS: 67.0000 mg | ORAL_TABLET | Freq: Every day | ORAL | 2 refills | Status: DC

## 2018-07-10 NOTE — Telephone Encounter (Signed)
Pt verbalized understanding of Dr. Victorino December recommendations and will call Dr. Bradd Burner her PMD. Pt agreed she seems to have possible allergy related problems.

## 2018-07-10 NOTE — Telephone Encounter (Signed)
I agree with referral to PCP. Please reassure her that her pacemaker download just 2 weeks ago looked great. We did an echo on her for the same complaints, exactly one year ago, showed normal results. The fact that this is happening every spring around the same time suggests it is pulmonary/environmental exposure-related. She is a longstanding smoker, too. I believe her evaluation should start with her lungs. Thanks [employer]

## 2018-07-10 NOTE — Telephone Encounter (Signed)
Pt c/o Shortness Of Breath: STAT if SOB developed within the last 24 hours or pt is noticeably SOB on the phone  1. Are you currently SOB (can you hear that pt is SOB on the phone)? No   2. How long have you been experiencing SOB? About a mouth.  3. Are you SOB when sitting or when up moving around? When up moving around.  4. Are you currently experiencing any other symptoms? No

## 2018-07-10 NOTE — Progress Notes (Signed)
[facility]  Patient ID: [name],  MRN: [id number]  PCP: Janifer Adie, MD  Date of Evaluation: 07/10/2018 Time spent:15 minutes  Chief Complaint:  Chief Complaint    Follow-up     Virtual Visit via Telephone Note  I connected with patient by a video enabled telemedicine application or telephone, with their informed consent, and verified patient privacy and that I am speaking with the correct person using two identifiers.  I am private, in my home and the patient is home.  I discussed the limitations, risks, security and privacy concerns of performing an evaluation and management service by telephone and the availability of in person appointments. I also discussed with the patient that there may be a patient responsible charge related to this service. The patient expressed understanding and agreed to proceed.   I discussed the assessment and treatment plan with the patient. The patient was provided an opportunity to ask questions and all were answered. The patient agreed with the plan and demonstrated an understanding of the instructions.   The patient was advised to call back or seek an in-person evaluation if the symptoms worsen or if the condition fails to improve as anticipated.  I provided 15   minutes of non-face-to-face time during this encounter.  HISTORY/CURRENT STATUS: HPI for 33-month med check.  The patient states that she is doing very well overall.  Her medications are working like they are supposed to.  She sleeps well and feels rested when she gets up.  Patient denies loss of interest in usual activities and is able to enjoy things.  Denies decreased energy or motivation.  Appetite has not changed.  No extreme sadness, tearfulness, or feelings of hopelessness.  Denies any changes in concentration, making decisions or remembering things.  Denies suicidal or homicidal thoughts.  She does report a tremor of her hands but states that the hydroxyzine  helps it.  She only takes that once a day.  The tremor does not bother her.  She denies any falls, balance issues, or other neurologic or musculoskeletal problems.  Individual Medical History/ Review of Systems: Changes? :No   Allergies: Penicillins and Prednisone  Current Medications:  Current Outpatient Medications:  .  ALPRAZolam (XANAX) 1 MG tablet, alprazolam 1 mg tablet  TAKE 1 TABLET BY MOUTH 4 TIMES A DAY, Disp: 120 tablet, Rfl: 2 .  hydrOXYzine (VISTARIL) 25 MG capsule, Take 1 capsule (25 mg total) by mouth 3 (three) times daily as needed., Disp: 30 capsule, Rfl: 2 .  mirtazapine (REMERON) 45 MG tablet, 1 and 1/2 tabs hs, Disp: 135 tablet, Rfl: 0 .  mirtazapine (REMERON) 45 MG tablet, Take 1.5 tablets (67 mg total) by mouth at bedtime., Disp: 45 tablet, Rfl: 2 .  omeprazole (PRILOSEC) 40 MG capsule, Take 40 mg by mouth 2 (two) times daily., Disp: , Rfl: 5 .  risperiDONE (RISPERDAL) 2 MG tablet, Take 0.5 tablets (1 mg total) by mouth 2 (two) times daily., Disp: 90 tablet, Rfl: 1 .  sertraline (ZOLOFT) 100 MG tablet, Take 2 tablets (200 mg total) by mouth daily., Disp: 60 tablet, Rfl: 2 Medication Side Effects: none  Family Medical/ Social History: Changes? No  MENTAL HEALTH EXAM:  There were no vitals taken for this visit.There is no height or weight on file to calculate BMI.  General Appearance: telephone visit, unable to assess  Eye Contact:  Unable to assess  Speech:  Clear and Coherent  Volume:  Normal  Mood:  Euthymic  Affect:  Unable to assess  Thought Process:  Goal Directed  Orientation:  Full (Time, Place, and Person)  Thought Content: Logical   Suicidal Thoughts:  No  Homicidal Thoughts:  No  Memory:  WNL  Judgement:  Good  Insight:  Good  Psychomotor Activity:  Unable to assess  Concentration:  Concentration: Good  Recall:  Good  Fund of Knowledge: Good  Language: Good  Assets:  Desire for Improvement  ADL's:  Intact  Cognition: WNL  Prognosis:  Good     DIAGNOSES:    ICD-10-CM   1. Recurrent major depressive disorder, in partial remission (HCC) F33.41 ALPRAZolam (XANAX) 1 MG tablet  2. Other insomnia G47.09 hydrOXYzine (VISTARIL) 25 MG capsule    mirtazapine (REMERON) 45 MG tablet  3. Generalized anxiety disorder F41.1     Receiving Psychotherapy: No    RECOMMENDATIONS:  Continue Xanax 1 mg p.o. 4 times daily as needed.  PDMP was reviewed. Continue hydroxyzine 25 mg daily as needed.  She can take it up to 3 times a day if needed but only usually does it once. Continue mirtazapine 45 mg, 1.5 pills nightly. Continue Risperdal 2 mg one half p.o. twice daily. Continue Zoloft 200 mg daily. Return in 3 months.  Donnal Moat, PA-C   This record has been created using [employer].  Chart creation errors have been sought, but may not always have been located and corrected. Such creation errors do not reflect on the standard of medical care.

## 2018-07-10 NOTE — Telephone Encounter (Signed)
Pt called to report that she has been having increased SOB with exertion and at rest worsening over the past 1-1/2 months. She denies chest pain, dizziness, peripheral edema... she has not had a cough, fever, and not exposed to anyone sick..  She is very vague and hard to pull information from. She says she "just can't breathe well" but feels fine otherwise. I asked if she has spoke with her PMD Dr. Bradd Burner and she says she has not.. I advised her to try and give him a call with her symptoms and I will also forward the information I have been given to Dr. Sallyanne Kuster for his review.

## 2018-07-11 ENCOUNTER — Other Ambulatory Visit: Payer: Self-pay

## 2018-07-11 ENCOUNTER — Other Ambulatory Visit: Payer: Self-pay | Admitting: Family Medicine

## 2018-07-11 ENCOUNTER — Ambulatory Visit
Admission: RE | Admit: 2018-07-11 | Discharge: 2018-07-11 | Disposition: A | Discharge status: Home / Self Care | Payer: Medicare HMO | Source: Ambulatory Visit | Attending: Family Medicine | Admitting: Family Medicine

## 2018-07-11 DIAGNOSIS — R0602 Shortness of breath: Secondary | ICD-10-CM

## 2018-07-15 ENCOUNTER — Ambulatory Visit: Payer: Medicare HMO | Admitting: Psychiatry

## 2018-07-17 ENCOUNTER — Telehealth: Payer: Self-pay | Admitting: Cardiovascular Disease

## 2018-07-17 NOTE — Telephone Encounter (Signed)
Spoke with pt and she saw PMD as instructed and was told to f/u with cardiologists Reiterated to pt that appears this is seasonal and needs to have lungs checked no other symptoms  . Per pt  CXR was done and was told normal findings .Will forward to Dr Sallyanne Kuster for review .Adonis Housekeeper

## 2018-07-17 NOTE — Telephone Encounter (Signed)
New Message:   Patient calling she states the doctor instructed  her to go to her primary care doctor and she did her primary care doctor told to follow up with the heart doctor. Please call patient back.

## 2018-07-17 NOTE — Telephone Encounter (Signed)
OK. We can set up for a non-urgent virtual visit to reassure MCr

## 2018-07-17 NOTE — Telephone Encounter (Signed)
Pt aware and appt made for 08/01/18 @ 2:10 pm

## 2018-08-01 ENCOUNTER — Telehealth: Payer: Medicare HMO | Admitting: Cardiovascular Disease

## 2018-08-04 ENCOUNTER — Other Ambulatory Visit: Payer: Self-pay | Admitting: Physician Assistant

## 2018-08-04 DIAGNOSIS — G4709 Other insomnia: Secondary | ICD-10-CM

## 2018-08-21 ENCOUNTER — Ambulatory Visit: Payer: Medicare HMO | Admitting: Physician Assistant

## 2018-08-28 ENCOUNTER — Other Ambulatory Visit: Payer: Self-pay | Admitting: Physician Assistant

## 2018-08-28 DIAGNOSIS — G4709 Other insomnia: Secondary | ICD-10-CM

## 2018-09-02 DIAGNOSIS — M25511 Pain in right shoulder: Secondary | ICD-10-CM | POA: Diagnosis not present

## 2018-09-08 DIAGNOSIS — R42 Dizziness and giddiness: Secondary | ICD-10-CM | POA: Diagnosis not present

## 2018-09-12 ENCOUNTER — Ambulatory Visit: Payer: Medicare HMO | Admitting: Physician Assistant

## 2018-09-15 ENCOUNTER — Other Ambulatory Visit: Payer: Self-pay

## 2018-09-15 ENCOUNTER — Encounter: Payer: Self-pay | Admitting: Cardiovascular Disease

## 2018-09-15 ENCOUNTER — Ambulatory Visit (INDEPENDENT_AMBULATORY_CARE_PROVIDER_SITE_OTHER): Payer: Medicare HMO | Admitting: Cardiovascular Disease

## 2018-09-15 VITALS — BP 122/82 | HR 86 | Temp 97.3°F | Ht 67.0 in | Wt 122.3 lb

## 2018-09-15 DIAGNOSIS — R0602 Shortness of breath: Secondary | ICD-10-CM

## 2018-09-15 DIAGNOSIS — E785 Hyperlipidemia, unspecified: Secondary | ICD-10-CM | POA: Diagnosis not present

## 2018-09-15 DIAGNOSIS — R0789 Other chest pain: Secondary | ICD-10-CM | POA: Diagnosis not present

## 2018-09-15 DIAGNOSIS — R072 Precordial pain: Secondary | ICD-10-CM | POA: Diagnosis not present

## 2018-09-15 DIAGNOSIS — R55 Syncope and collapse: Secondary | ICD-10-CM | POA: Diagnosis not present

## 2018-09-15 DIAGNOSIS — I455 Other specified heart block: Secondary | ICD-10-CM

## 2018-09-15 DIAGNOSIS — R0600 Dyspnea, unspecified: Secondary | ICD-10-CM | POA: Insufficient documentation

## 2018-09-15 DIAGNOSIS — Z950 Presence of cardiac pacemaker: Secondary | ICD-10-CM | POA: Diagnosis not present

## 2018-09-15 DIAGNOSIS — R079 Chest pain, unspecified: Secondary | ICD-10-CM | POA: Insufficient documentation

## 2018-09-15 MED ORDER — METOPROLOL TARTRATE 100 MG PO TABS: ORAL_TABLET | ORAL | 0 refills | Status: DC

## 2018-09-15 NOTE — Progress Notes (Signed)
Cardiology Office Note    Date:  09/15/2018   ID:  Tabitha, Kim [birth year], MRN [id number]  PCP:  Janifer Adie, MD  Cardiologist:   Sanda Klein, MD   No chief complaint on file.   History of Present Illness:  [name] is a 63 y.o. 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 At the time it was unclear whether she had conduction system disease or severe cardioinhibitory neurally mediated syncope, it appears the latter was the case. She requires very little pacing. However after the pacemaker was implanted she has not had any further attempts of syncope. Her daughter [name] has inappropriate sinus tachycardia. Her other daughter had a stillborn child with a BMP mutation for ARVC and may be a mutation carrier (unclear whether she has clinically relevant ARVC).  She describes occasional left-sided chest pain but is not sure whether this is just related to her breast or is deeper inside her chest.  It is never associated with activity and occurs randomly at rest.  It happened at least 20 times in the last 6 months and resolve spontaneously.  She still has occasional problems with shortness of breath "comes and goes" "without clear relationship to physical activity, weather or any other precipitant.  She does not have wheezing.  She continues to smoke although she has cut back to 14 cigarettes a day.  She has a roughly 30-pack-year history of smoking.  Does not have problems with lower extremity edema.  She has not had syncope and is not bothered by palpitations.  Her brother was recently diagnosed with coronary disease (he also was a smoker.  She is worried that she may have coronary blockages also.  Her echocardiogram showed normal left ventricular systolic function with EF 55-60% and normal regional wall motion with grade 1 diastolic dysfunction.  Denies palpitations, dizziness, syncope, falls, PND, orthopnea, claudication, focal  neurological complaints.  Her recent lipid profile showed an LDL cholesterol of 129.  She often has borderline glucose levels in the 100-110 range and her hemoglobin A1c was 5.9%, although she is very lean.  Pacemaker interrogation shows normal function. She has a Medtronic Adapta dual-chamber device implanted in December 2014 but still has over 11 years of estimated longevity. Lead parameters are excellent (both leads are Medtronic 5076 leads).  She has less than 1% atrial and ventricular pacing.  The device is programmed with a lower rate limit of 50 bpm and search AV+ is on.  She has occasional far field R wave oversensing but also some very brief episodes of true atrial tachycardia.  No atrial fibrillation.            Past Medical History:  Diagnosis Date  . Anxiety    anxiety and mild depressive order systoms  . Bipolar disorder ([place])     (02/20/2013)  . Dysrhythmia   . GEZMOQHU(765.4)    "monthly" (02/20/2013)  . Pacemaker    medtronic  . PONV (postoperative nausea and vomiting)   . Sinus pause 02/14/2013  . Syncope and collapse    echo-April 12,2012-EF 55% Echo normal; recorder with prolonged sinus pauses --> status post  . Tobacco abuse   . Vertigo     Past Surgical History:  Procedure Laterality Date  . ABDOMINAL HYSTERECTOMY  03/19/1980   "partial"  . BACK SURGERY    . CERVICAL FUSION Left 11/2013   C4-C6  . CERVICAL FUSION     3,4,5,  . INSERT /  REPLACE / REMOVE PACEMAKER  02/20/2013   medtronic  . LOOP RECORDER IMPLANT N/A 11/06/2012   Procedure: LINQ LOOP RECORDER IMPLANT;  Surgeon: Sanda Klein, MD;  Location: [facility] CATH [facility];  Service: Cardiovascular;  Laterality: N/A;  . PERMANENT PACEMAKER INSERTION N/A 02/20/2013   Procedure: PERMANENT PACEMAKER INSERTION;  Surgeon: Sanda Klein, MD;  Location: [facility] CATH LAB;  Service: Cardiovascular;  Laterality: N/A;  . POSTERIOR LUMBAR FUSION  ~ 2009  . TRANSTHORACIC ECHOCARDIOGRAM  07/11/2010   The left atrial size is  normal.There is no evidence of mitral vavle prolaspe.Right ventriicular systolic pressure is normal . Injection of contrast documented no interatrial shunt. Essentially normal 2D echo -doppler study     Current Medications: Outpatient Medications Prior to Visit  Medication Sig Dispense Refill  . ALPRAZolam (XANAX) 1 MG tablet alprazolam 1 mg tablet  TAKE 1 TABLET BY MOUTH 4 TIMES A DAY 120 tablet 2  . hydrOXYzine (VISTARIL) 25 MG capsule TAKE 1 CAPSULE (25 MG TOTAL) BY MOUTH 3 (THREE) TIMES DAILY AS NEEDED. 270 capsule 0  . mirtazapine (REMERON) 45 MG tablet 1 and 1/2 tabs hs 135 tablet 0  . mirtazapine (REMERON) 45 MG tablet Take 1.5 tablets (67 mg total) by mouth at bedtime. 45 tablet 2  . omeprazole (PRILOSEC) 40 MG capsule Take 40 mg by mouth 2 (two) times daily.  5  . risperiDONE (RISPERDAL) 2 MG tablet Take 0.5 tablets (1 mg total) by mouth 2 (two) times daily. 90 tablet 1  . sertraline (ZOLOFT) 100 MG tablet Take 2 tablets (200 mg total) by mouth daily. 60 tablet 2   No facility-administered medications prior to visit.      Allergies:   Penicillins and Prednisone   Social History   Socioeconomic History  . Marital status: Married    Spouse name: Not on file  . Number of children: Not on file  . Years of education: Not on file  . Highest education level: Not on file  Occupational History  . Not on file  Social Needs  . Financial resource strain: Not on file  . Food insecurity    Worry: Not on file    Inability: Not on file  . Transportation needs    Medical: Not on file    Non-medical: Not on file  Tobacco Use  . Smoking status: Current Every Day Smoker    Packs/day: 0.75    Years: 40.00    Pack years: 30.00    Types: Cigarettes  . Smokeless tobacco: Never Used  Substance and Sexual Activity  . Alcohol use: No  . Drug use: No  . Sexual activity: Yes  Lifestyle  . Physical activity    Days per week: Not on file    Minutes per session: Not on file  . Stress:  Not on file  Relationships  . Social Herbalist on phone: Not on file    Gets together: Not on file    Attends religious service: Not on file    Active member of club or organization: Not on file    Attends meetings of clubs or organizations: Not on file    Relationship status: Not on file  Other Topics Concern  . Not on file  Social History Narrative   Married 29 years . Has 3 children. Current smoker -1 pack a day-for 36 years .Alcohol :  1 beer on occasion.   She is soon to be a grandmother.  His before to going on a  trip up to the [place] to be closer to family when the baby is born.  She is extremely excited.   She very much would like to move back up to that area to be closer to family, but the whether is it just too cold in the winter.     Family History:  The patient's family history includes Heart attack in her brother. She was adopted.  one daughter is a mutation carrier for ARVC, the other has inappropriate sinus tachycardia   ROS:   Please see the history of present illness.    ROS All other systems reviewed and are negative.   PHYSICAL EXAM:   VS:  BP 122/82   Pulse 86   Temp (!) 97.3 F (36.3 C)   Ht 5\' 7"  (1.702 m)   Wt 122 lb 4.8 oz (55.5 kg)   SpO2 95%   BMI 19.15 kg/m      General: Alert, oriented x3, no distress, very lean, borderline malnourished Head: no evidence of trauma, PERRL, EOMI, no exophtalmos or lid lag, no myxedema, no xanthelasma; normal ears, nose and oropharynx Neck: normal jugular venous pulsations and no hepatojugular reflux; brisk carotid pulses without delay and no carotid bruits Chest: clear to auscultation, no signs of consolidation by percussion or palpation, normal fremitus, symmetrical and full respiratory excursions Cardiovascular: normal position and quality of the apical impulse, regular rhythm, normal first and second heart sounds, no murmurs, rubs or gallops Abdomen: no tenderness or distention, no  masses by palpation, no abnormal pulsatility or arterial bruits, normal bowel sounds, no hepatosplenomegaly Extremities: no clubbing, cyanosis or edema; 2+ radial, ulnar and brachial pulses bilaterally; 2+ right femoral, posterior tibial and dorsalis pedis pulses; 2+ left femoral, posterior tibial and dorsalis pedis pulses; no subclavian or femoral bruits Neurological: grossly nonfocal Psych: Normal mood and affect    Wt Readings from Last 3 Encounters:  09/15/18 122 lb 4.8 oz (55.5 kg)  06/25/17 122 lb 3.2 oz (55.4 kg)  06/22/16 117 lb 3.2 oz (53.2 kg)      Studies/Labs Reviewed:   EKG:  EKG is ordered today.  It shows normal sinus rhythm and is a normal tracing.  Off-and-on over the years she has had nonspecific T wave inversions especially in the precordial leads.  These are not seen today.   ASSESSMENT:    1. Syncope and collapse   2. Precordial pain   3. Hyperlipidemia, unspecified hyperlipidemia type      PLAN:  In order of problems listed above:  1. Chest pain: Her symptoms are highly atypical but she does have several coronary risk factors including family history, prediabetes, hyperlipidemia.  She is a active smoker.  We will schedule for coronary CT angiogram.  If she does have coronary stenoses or even if she just has a high calcium score, this would justify treatment with a statin.   2. Syncope: Probably neurally mediated, cardioinhibitory, and without recurrence since pacemaker implantation.  Neurally mediated/cardioinhibitory. Loop recorder revealed several episodes of symptomatic pauses prior to pacemaker implantation.  3. PM: Normal pacemaker function.  Remote downloads every 3 months and yearly office visits 4. Dyspnea: Strongly encourage smoking cessation.  I believe smoking-related lung disease is the most likely reason for her dyspnea.  Consider referral for formal pulmonary function tests after her chest CT, although the answer may be apparent on the imaging  studies.    Medication Adjustments/Labs and Tests Ordered: Current medicines are reviewed at length with the patient  today.  Concerns regarding medicines are outlined above.  Medication changes, Labs and Tests ordered today are listed in the Patient Instructions below. Patient Instructions  Medication Instructions:  Your physician recommends that you continue on your current medications as directed. Please refer to the Current Medication list given to you today.  If you need a refill on your cardiac medications before your next appointment, please call your pharmacy.   Lab work: Your provider would like for you to return one week before you Cardiac CTA to have the following labs drawn: Fasting Lipid and BMET. You do not need an appointment for the lab. Once in our office lobby there is a podium where you can sign in and ring the doorbell to alert [place] that you are here. The lab is open from 8:00 am to 4:30 pm; closed for lunch from 12:45pm-1:45pm.  If you have labs (blood work) drawn today and your tests are completely normal, you will receive your results only by: [place] (if you have MyChart) OR A paper copy in the mail If you have any lab test that is abnormal or we need to change your treatment, we will call you to review the results.  Testing/Procedures: Your physician has requested that you have cardiac CT. Cardiac computed tomography (CT) is a painless test that uses an x-ray machine to take clear, detailed pictures of your heart. For further information please visit [facility][email or web address]. Please follow instruction sheet as given.  Follow-Up: At [facility], you and your health needs are our priority.  As part of our continuing mission to provide you with exceptional heart care, we have created designated Provider Care Teams.  These Care Teams include your primary Cardiologist (physician) and Advanced Practice Providers (APPs -  Physician Assistants and Nurse Practitioners) who  all work together to provide you with the care you need, when you need it. You will need a follow up appointment in 12 months.  Please call our office 2 months in advance to schedule this appointment.  You may see Sanda Klein, MD or one of the following Advanced Practice Providers on your designated Care Team: Almyra Deforest, [place] Fabian Sharp, [place]  Any Other Special Instructions Will Be Listed Below (If Applicable).  Please arrive at the [facility] main entrance of [facility] at xx:xx AM (30-45 minutes prior to test start time)  [facility] [place], [place] [address] [phone]  Proceed to the [facility] Radiology Department (First Floor).  Please follow these instructions carefully (unless otherwise directed):   On the Night Before the Test: . Be sure to Drink plenty of water. . Do not consume any caffeinated/decaffeinated beverages or chocolate 12 hours prior to your test. . Do not take any antihistamines 12 hours prior to your test.  On the Day of the Test: . Drink plenty of water. Do not drink any water within one hour of the test. . Do not eat any food 4 hours prior to the test. . You may take your regular medications prior to the test.  . Take metoprolol (Lopressor) two hours prior to test.   Do not give Lopressor to patients with an allergy to lopressor or anyone with asthma or active COPD symptoms (currently taking steroids).       After the Test: . Drink plenty of water. . After receiving IV contrast, you may experience a mild flushed feeling. This is normal. . On occasion, you may experience a mild rash up to  24 hours after the test. This is not dangerous. If this occurs, you can take Benadryl 25 mg and increase your fluid intake. . If you experience trouble breathing, this can be serious. If it is severe call 911 IMMEDIATELY. If it is mild, please call our office. . If you take any of these medications: Glipizide/Metformin,  Avandament, Glucavance, please do not take 48 hours after completing test.          Signed, Sanda Klein, MD  09/15/2018 2:54 PM    [address], [place], [place]  [address] Phone: [phone]; Fax: [phone]

## 2018-09-15 NOTE — Patient Instructions (Signed)
Medication Instructions:  Your physician recommends that you continue on your current medications as directed. Please refer to the Current Medication list given to you today.  If you need a refill on your cardiac medications before your next appointment, please call your pharmacy.   Lab work: Your provider would like for you to return one week before you Cardiac CTA to have the following labs drawn: Fasting Lipid and BMET. You do not need an appointment for the lab. Once in our office lobby there is a podium where you can sign in and ring the doorbell to alert [place] that you are here. The lab is open from 8:00 am to 4:30 pm; closed for lunch from 12:45pm-1:45pm.  If you have labs (blood work) drawn today and your tests are completely normal, you will receive your results only by: [place] (if you have MyChart) OR A paper copy in the mail If you have any lab test that is abnormal or we need to change your treatment, we will call you to review the results.  Testing/Procedures: Your physician has requested that you have cardiac CT. Cardiac computed tomography (CT) is a painless test that uses an x-ray machine to take clear, detailed pictures of your heart. For further information please visit [facility]. Please follow instruction sheet as given.  Follow-Up: At [facility], you and your health needs are our priority.  As part of our continuing mission to provide you with exceptional heart care, we have created designated Provider Care Teams.  These Care Teams include your primary Cardiologist (physician) and Advanced Practice Providers (APPs -  Physician Assistants and Nurse Practitioners) who all work together to provide you with the care you need, when you need it. You will need a follow up appointment in 12 months.  Please call our office 2 months in advance to schedule this appointment.  You may see Sanda Klein, MD or one of the following Advanced Practice Providers on your  designated Care Team: Almyra Deforest, [place] Fabian Sharp, [place]  Any Other Special Instructions Will Be Listed Below (If Applicable).  Please arrive at the [facility] main entrance of [facility] at xx:xx AM (30-45 minutes prior to test start time)  [facility] [place], [place] [address] [phone]  Proceed to the [facility] Radiology Department (First Floor).  Please follow these instructions carefully (unless otherwise directed):   On the Night Before the Test: . Be sure to Drink plenty of water. . Do not consume any caffeinated/decaffeinated beverages or chocolate 12 hours prior to your test. . Do not take any antihistamines 12 hours prior to your test.  On the Day of the Test: . Drink plenty of water. Do not drink any water within one hour of the test. . Do not eat any food 4 hours prior to the test. . You may take your regular medications prior to the test.  . Take metoprolol (Lopressor) two hours prior to test.   Do not give Lopressor to patients with an allergy to lopressor or anyone with asthma or active COPD symptoms (currently taking steroids).       After the Test: . Drink plenty of water. . After receiving IV contrast, you may experience a mild flushed feeling. This is normal. . On occasion, you may experience a mild rash up to 24 hours after the test. This is not dangerous. If this occurs, you can take Benadryl 25 mg and increase your fluid intake. . If you  experience trouble breathing, this can be serious. If it is severe call 911 IMMEDIATELY. If it is mild, please call our office. . If you take any of these medications: Glipizide/Metformin, Avandament, Glucavance, please do not take 48 hours after completing test.

## 2018-09-23 ENCOUNTER — Encounter: Payer: Medicare HMO | Admitting: *Deleted

## 2018-09-24 ENCOUNTER — Other Ambulatory Visit: Payer: Self-pay | Admitting: Cardiovascular Disease

## 2018-09-24 ENCOUNTER — Telehealth: Payer: Self-pay

## 2018-09-24 ENCOUNTER — Other Ambulatory Visit: Payer: Self-pay | Admitting: Physician Assistant

## 2018-09-24 DIAGNOSIS — R55 Syncope and collapse: Secondary | ICD-10-CM | POA: Diagnosis not present

## 2018-09-24 DIAGNOSIS — G4709 Other insomnia: Secondary | ICD-10-CM

## 2018-09-24 DIAGNOSIS — R072 Precordial pain: Secondary | ICD-10-CM | POA: Diagnosis not present

## 2018-09-24 DIAGNOSIS — E785 Hyperlipidemia, unspecified: Secondary | ICD-10-CM | POA: Diagnosis not present

## 2018-09-24 NOTE — Telephone Encounter (Signed)
Spoke with patient to remind of missed remote transmission 

## 2018-09-25 ENCOUNTER — Telehealth: Payer: Self-pay | Admitting: *Deleted

## 2018-09-25 DIAGNOSIS — N289 Disorder of kidney and ureter, unspecified: Secondary | ICD-10-CM

## 2018-09-25 DIAGNOSIS — E785 Hyperlipidemia, unspecified: Secondary | ICD-10-CM

## 2018-09-25 LAB — LIPID PANEL
Cholesterol: 256 mg/dL — ABNORMAL HIGH (ref <200)
HDL: 50 mg/dL (ref >=50)
LDL Cholesterol (Calc): 179 mg/dL (calc) — ABNORMAL HIGH
Non-HDL Cholesterol (Calc): 206 mg/dL (calc) — ABNORMAL HIGH (ref <130)
Total CHOL/HDL Ratio: 5.1 (calc) — ABNORMAL HIGH (ref <5.0)
Triglycerides: 130 mg/dL (ref <150)

## 2018-09-25 LAB — BASIC METABOLIC PANEL WITH GFR
BUN/Creatinine Ratio: 8 (calc) (ref 6–22)
BUN: 9 mg/dL (ref 7–25)
CO2: 27 mmol/L (ref 20–32)
Calcium: 10 mg/dL (ref 8.6–10.4)
Chloride: 103 mmol/L (ref 98–110)
Creat: 1.08 mg/dL — ABNORMAL HIGH (ref 0.50–0.99)
GFR, Est African American: 65 mL/min/{1.73_m2} (ref >=60)
GFR, Est Non African American: 56 mL/min/{1.73_m2} — ABNORMAL LOW (ref >=60)
Glucose, Bld: 115 mg/dL — ABNORMAL HIGH (ref 65–99)
Potassium: 4 mmol/L (ref 3.5–5.3)
Sodium: 138 mmol/L (ref 135–146)

## 2018-09-25 NOTE — Telephone Encounter (Signed)
-----   Message from Sanda Klein, MD sent at 09/25/2018  7:56 AM EDT ----- Cholesterol is very high and justifies treatment with medications whether or not the evaluation shows evidence of coronary blockages. Recommend starting rosuvastatin 20 mg once daily.  Recheck lipid profile in 3 months. Kidney function is mildly abnormal and should also repeat CMET in 3 months. Interestingly, despite the fact that she is relatively lean, cholesterol is borderline for diabetes mellitus.  This has been seen occasionally over the years as far back as 2009.  Recommend reduced intake of sweets and starchy foods and more physical activity.

## 2018-09-25 NOTE — Telephone Encounter (Signed)
Left a message for the patient to call back.  

## 2018-09-29 ENCOUNTER — Ambulatory Visit (HOSPITAL_COMMUNITY)
Admission: RE | Admit: 2018-09-29 | Discharge: 2018-09-29 | Disposition: A | Discharge status: Home / Self Care | Payer: Medicare HMO | Source: Ambulatory Visit | Attending: Cardiovascular Disease | Admitting: Cardiovascular Disease

## 2018-09-29 ENCOUNTER — Ambulatory Visit (HOSPITAL_COMMUNITY): Payer: Medicare HMO

## 2018-09-29 ENCOUNTER — Other Ambulatory Visit: Payer: Self-pay

## 2018-09-29 ENCOUNTER — Encounter (HOSPITAL_COMMUNITY): Payer: Self-pay

## 2018-09-29 DIAGNOSIS — R072 Precordial pain: Secondary | ICD-10-CM | POA: Diagnosis not present

## 2018-09-29 MED ORDER — NITROGLYCERIN 0.4 MG SL SUBL
SUBLINGUAL_TABLET | SUBLINGUAL | Status: AC
  Administered 2018-09-29: 0.8 mg via SUBLINGUAL
  Filled 2018-09-29: qty 2

## 2018-09-29 MED ORDER — NITROGLYCERIN 0.4 MG SL SUBL
0.8000 mg | SUBLINGUAL_TABLET | Freq: Once | SUBLINGUAL | Status: AC
  Administered 2018-09-29: 0.8 mg via SUBLINGUAL
  Filled 2018-09-29: qty 25

## 2018-09-29 MED ORDER — IOHEXOL 350 MG/ML SOLN
80.0000 mL | Freq: Once | INTRAVENOUS | Status: AC | PRN
  Administered 2018-09-29: 80 mL via INTRAVENOUS

## 2018-10-01 MED ORDER — ROSUVASTATIN CALCIUM 20 MG PO TABS: 20.0000 mg | ORAL_TABLET | Freq: Every day | ORAL | 3 refills | Status: DC

## 2018-10-01 NOTE — Telephone Encounter (Signed)
Patient made aware of results and verbalized understanding.  Repeat lab orders have been placed.

## 2018-10-01 NOTE — Addendum Note (Signed)
Addended by: Ricci Barker on: 10/01/2018 10:32 AM   Modules accepted: Orders

## 2018-10-02 DIAGNOSIS — M25551 Pain in right hip: Secondary | ICD-10-CM | POA: Diagnosis not present

## 2018-10-02 DIAGNOSIS — M25531 Pain in right wrist: Secondary | ICD-10-CM | POA: Diagnosis not present

## 2018-10-04 ENCOUNTER — Encounter: Payer: Self-pay | Admitting: Cardiology

## 2018-10-08 ENCOUNTER — Telehealth: Payer: Self-pay | Admitting: Physician Assistant

## 2018-10-08 ENCOUNTER — Other Ambulatory Visit: Payer: Self-pay

## 2018-10-08 MED ORDER — SERTRALINE HCL 100 MG PO TABS: 200.0000 mg | ORAL_TABLET | Freq: Every day | ORAL | 2 refills | Status: DC

## 2018-10-08 NOTE — Telephone Encounter (Signed)
Pt did have an office visit with Helene Kelp already on 07/10/2018, will confirm her medications and send to pharmacy if appropriate.

## 2018-10-08 NOTE — Telephone Encounter (Signed)
Previous patient of [facility] Patient appointments rescheduled need refills on Xanax, Hydrosyzinedan 25 mg, Rispridone 2 mg, Mirozepam 45 mg, Sertraline 100 mg, Omeprazole 40 mg to be sent to [facility] on [address].  Patient stated she hasn't seen anyone since Paisley passed

## 2018-10-08 NOTE — Telephone Encounter (Signed)
Will check with her pharmacy to see if any refills on file but pt just filled xanax 1 mg on 09/26/2018 #120  Should have refills on file for 90 day on Mirtazapine, hydroxyzine, and risperidone.  Will submit Sertraline

## 2018-10-09 ENCOUNTER — Ambulatory Visit: Payer: Medicare HMO | Admitting: Physician Assistant

## 2018-10-10 ENCOUNTER — Other Ambulatory Visit: Payer: Self-pay

## 2018-10-10 DIAGNOSIS — G4709 Other insomnia: Secondary | ICD-10-CM

## 2018-10-10 MED ORDER — RISPERIDONE 2 MG PO TABS: 1.0000 mg | ORAL_TABLET | Freq: Two times a day (BID) | ORAL | 0 refills | Status: DC

## 2018-10-10 MED ORDER — HYDROXYZINE PAMOATE 25 MG PO CAPS: 25.0000 mg | ORAL_CAPSULE | Freq: Three times a day (TID) | ORAL | 0 refills | Status: DC | PRN

## 2018-10-10 MED ORDER — MIRTAZAPINE 45 MG PO TABS: 67.0000 mg | ORAL_TABLET | Freq: Every day | ORAL | 0 refills | Status: DC

## 2018-10-10 NOTE — Telephone Encounter (Signed)
I called the pharmacy and they stated that she does not have any refills on file for: Xanax, Mirtazapine, Risperidone or Hydroxyzine. Please Send refills to [facility] on [address].

## 2018-10-10 NOTE — Telephone Encounter (Signed)
Thank you they were sent 07/09 but I will resubmit.

## 2018-10-13 ENCOUNTER — Ambulatory Visit: Payer: Medicare HMO | Admitting: Physician Assistant

## 2018-10-13 DIAGNOSIS — R194 Change in bowel habit: Secondary | ICD-10-CM | POA: Diagnosis not present

## 2018-10-13 DIAGNOSIS — R140 Abdominal distension (gaseous): Secondary | ICD-10-CM | POA: Diagnosis not present

## 2018-10-15 ENCOUNTER — Other Ambulatory Visit: Payer: Self-pay | Admitting: Family Medicine

## 2018-10-15 DIAGNOSIS — R140 Abdominal distension (gaseous): Secondary | ICD-10-CM

## 2018-10-15 DIAGNOSIS — R194 Change in bowel habit: Secondary | ICD-10-CM

## 2018-10-16 ENCOUNTER — Telehealth: Payer: Self-pay | Admitting: Cardiovascular Disease

## 2018-10-16 NOTE — Telephone Encounter (Signed)
New message:    Patient device is not working and would like for some one to call her back.

## 2018-10-16 NOTE — Telephone Encounter (Signed)
Call returned to the patient. The mailbox was full. Unable to leave a message

## 2018-10-20 ENCOUNTER — Other Ambulatory Visit: Payer: Self-pay

## 2018-10-20 DIAGNOSIS — F3341 Major depressive disorder, recurrent, in partial remission: Secondary | ICD-10-CM

## 2018-10-20 MED ORDER — ALPRAZOLAM 1 MG PO TABS: ORAL_TABLET | ORAL | 2 refills | Status: DC

## 2018-10-20 NOTE — Telephone Encounter (Signed)
Spoke w/ pt and she stated that when she tries to send transmission the screen will turn orange half way through transmission. Reports that she does not see an error code. She has unplugged the monitor and plugged it back in and got the same results. Instructed pt to call tech support. Pt verbalized understanding.

## 2018-10-20 NOTE — Telephone Encounter (Signed)
LMOVM for pt to return call 

## 2018-10-20 NOTE — Telephone Encounter (Signed)
Returned cal to pt she states that she "turns it on it will stay on for awhile and then turns orange" and appears not to be working. Please advise

## 2018-10-21 ENCOUNTER — Ambulatory Visit (INDEPENDENT_AMBULATORY_CARE_PROVIDER_SITE_OTHER): Payer: Medicare HMO | Admitting: *Deleted

## 2018-10-21 DIAGNOSIS — R55 Syncope and collapse: Secondary | ICD-10-CM | POA: Diagnosis not present

## 2018-10-21 DIAGNOSIS — I495 Sick sinus syndrome: Secondary | ICD-10-CM

## 2018-10-21 NOTE — Telephone Encounter (Signed)
Spoke w/ pt and she stated that she has not called tech support yet. Gave her the tech support number again. She stated that she would call.

## 2018-10-22 DIAGNOSIS — Z862 Personal history of diseases of the blood and blood-forming organs and certain disorders involving the immune mechanism: Secondary | ICD-10-CM | POA: Diagnosis not present

## 2018-10-22 LAB — CUP PACEART REMOTE DEVICE CHECK
Battery Impedance: 256 Ohm
Battery Remaining Longevity: 128 mo
Battery Voltage: 2.79 V
Brady Statistic AP VP Percent: 0 %
Brady Statistic AP VS Percent: 0 %
Brady Statistic AS VP Percent: 1 %
Brady Statistic AS VS Percent: 99 %
Date Time Interrogation Session: 20200804213558
Implantable Lead Implant Date: 20141205
Implantable Lead Implant Date: 20141205
Implantable Lead Location: 753859
Implantable Lead Location: 753860
Implantable Lead Model: 5076
Implantable Lead Model: 5076
Implantable Pulse Generator Implant Date: 20141205
Lead Channel Impedance Value: 407 Ohm
Lead Channel Impedance Value: 464 Ohm
Lead Channel Pacing Threshold Amplitude: 0.5 V
Lead Channel Pacing Threshold Amplitude: 0.875 V
Lead Channel Pacing Threshold Pulse Width: 0.4 ms
Lead Channel Pacing Threshold Pulse Width: 0.4 ms
Lead Channel Setting Pacing Amplitude: 2 V
Lead Channel Setting Pacing Amplitude: 2.5 V
Lead Channel Setting Pacing Pulse Width: 0.4 ms
Lead Channel Setting Sensing Sensitivity: 2 mV

## 2018-10-22 NOTE — Telephone Encounter (Signed)
Spoke w/ pt and she informed me that she did call tech support and the remote transmission was received.

## 2018-10-23 ENCOUNTER — Ambulatory Visit
Admission: RE | Admit: 2018-10-23 | Discharge: 2018-10-23 | Disposition: A | Discharge status: Home / Self Care | Payer: Medicare HMO | Source: Ambulatory Visit | Attending: Family Medicine | Admitting: Family Medicine

## 2018-10-23 DIAGNOSIS — R140 Abdominal distension (gaseous): Secondary | ICD-10-CM

## 2018-10-23 DIAGNOSIS — K8020 Calculus of gallbladder without cholecystitis without obstruction: Secondary | ICD-10-CM | POA: Diagnosis not present

## 2018-10-23 DIAGNOSIS — R194 Change in bowel habit: Secondary | ICD-10-CM

## 2018-10-23 MED ORDER — IOPAMIDOL (ISOVUE-300) INJECTION 61%
100.0000 mL | Freq: Once | INTRAVENOUS | Status: AC | PRN
  Administered 2018-10-23: 100 mL via INTRAVENOUS

## 2018-10-27 ENCOUNTER — Other Ambulatory Visit: Payer: Self-pay | Admitting: Family Medicine

## 2018-10-27 DIAGNOSIS — R1900 Intra-abdominal and pelvic swelling, mass and lump, unspecified site: Secondary | ICD-10-CM

## 2018-10-28 ENCOUNTER — Encounter: Payer: Self-pay | Admitting: Cardiology

## 2018-10-28 NOTE — Progress Notes (Signed)
Remote pacemaker transmission.   

## 2018-11-03 ENCOUNTER — Other Ambulatory Visit: Payer: Self-pay

## 2018-11-03 ENCOUNTER — Ambulatory Visit
Admission: RE | Admit: 2018-11-03 | Discharge: 2018-11-03 | Disposition: A | Discharge status: Home / Self Care | Payer: Medicare HMO | Source: Ambulatory Visit | Attending: Family Medicine | Admitting: Family Medicine

## 2018-11-03 DIAGNOSIS — R1900 Intra-abdominal and pelvic swelling, mass and lump, unspecified site: Secondary | ICD-10-CM

## 2018-11-03 DIAGNOSIS — N83202 Unspecified ovarian cyst, left side: Secondary | ICD-10-CM | POA: Diagnosis not present

## 2018-11-05 ENCOUNTER — Other Ambulatory Visit (HOSPITAL_COMMUNITY)
Admission: RE | Admit: 2018-11-05 | Discharge: 2018-11-05 | Disposition: A | Discharge status: Home / Self Care | Payer: Medicare HMO | Source: Ambulatory Visit | Attending: Obstetrics and Gynecology | Admitting: Obstetrics and Gynecology

## 2018-11-05 ENCOUNTER — Other Ambulatory Visit: Payer: Self-pay | Admitting: Obstetrics and Gynecology

## 2018-11-05 DIAGNOSIS — Z8541 Personal history of malignant neoplasm of cervix uteri: Secondary | ICD-10-CM | POA: Insufficient documentation

## 2018-11-05 DIAGNOSIS — Z1151 Encounter for screening for human papillomavirus (HPV): Secondary | ICD-10-CM | POA: Insufficient documentation

## 2018-11-05 DIAGNOSIS — N83292 Other ovarian cyst, left side: Secondary | ICD-10-CM | POA: Diagnosis not present

## 2018-11-05 DIAGNOSIS — R69 Illness, unspecified: Secondary | ICD-10-CM | POA: Diagnosis not present

## 2018-11-08 LAB — CYTOLOGY - PAP
Diagnosis: NEGATIVE
HPV: NOT DETECTED

## 2018-11-10 ENCOUNTER — Telehealth: Payer: Self-pay | Admitting: *Deleted

## 2018-11-10 NOTE — Telephone Encounter (Signed)
Called and scheduled the patient for an appt on 8/31

## 2018-11-14 ENCOUNTER — Ambulatory Visit: Payer: Medicare HMO | Admitting: Physician Assistant

## 2018-11-17 ENCOUNTER — Other Ambulatory Visit: Payer: Self-pay

## 2018-11-17 ENCOUNTER — Other Ambulatory Visit: Payer: Self-pay | Admitting: Gynecologic Oncology

## 2018-11-17 ENCOUNTER — Inpatient Hospital Stay: Payer: Medicare HMO | Attending: Gynecologic Oncology | Admitting: Gynecologic Oncology

## 2018-11-17 ENCOUNTER — Ambulatory Visit (INDEPENDENT_AMBULATORY_CARE_PROVIDER_SITE_OTHER): Payer: Medicare HMO | Admitting: Adult Health

## 2018-11-17 ENCOUNTER — Encounter: Payer: Self-pay | Admitting: Adult Health

## 2018-11-17 ENCOUNTER — Other Ambulatory Visit: Payer: Self-pay | Admitting: Physician Assistant

## 2018-11-17 ENCOUNTER — Encounter: Payer: Self-pay | Admitting: Gynecologic Oncology

## 2018-11-17 VITALS — BP 130/93 | HR 113 | Temp 98.0°F | Resp 18 | Ht 67.5 in | Wt 140.0 lb

## 2018-11-17 DIAGNOSIS — Z79899 Other long term (current) drug therapy: Secondary | ICD-10-CM | POA: Diagnosis not present

## 2018-11-17 DIAGNOSIS — G4700 Insomnia, unspecified: Secondary | ICD-10-CM | POA: Diagnosis not present

## 2018-11-17 DIAGNOSIS — G4709 Other insomnia: Secondary | ICD-10-CM

## 2018-11-17 DIAGNOSIS — F319 Bipolar disorder, unspecified: Secondary | ICD-10-CM | POA: Insufficient documentation

## 2018-11-17 DIAGNOSIS — N838 Other noninflammatory disorders of ovary, fallopian tube and broad ligament: Secondary | ICD-10-CM

## 2018-11-17 DIAGNOSIS — F411 Generalized anxiety disorder: Secondary | ICD-10-CM

## 2018-11-17 DIAGNOSIS — N83202 Unspecified ovarian cyst, left side: Secondary | ICD-10-CM

## 2018-11-17 DIAGNOSIS — D3912 Neoplasm of uncertain behavior of left ovary: Secondary | ICD-10-CM | POA: Diagnosis not present

## 2018-11-17 DIAGNOSIS — R978 Other abnormal tumor markers: Secondary | ICD-10-CM | POA: Insufficient documentation

## 2018-11-17 DIAGNOSIS — F331 Major depressive disorder, recurrent, moderate: Secondary | ICD-10-CM | POA: Diagnosis not present

## 2018-11-17 DIAGNOSIS — F17210 Nicotine dependence, cigarettes, uncomplicated: Secondary | ICD-10-CM | POA: Insufficient documentation

## 2018-11-17 DIAGNOSIS — R69 Illness, unspecified: Secondary | ICD-10-CM | POA: Diagnosis not present

## 2018-11-17 MED ORDER — MIRTAZAPINE 45 MG PO TABS: 67.0000 mg | ORAL_TABLET | Freq: Every day | ORAL | 3 refills | Status: DC

## 2018-11-17 MED ORDER — ALPRAZOLAM 1 MG PO TABS: ORAL_TABLET | ORAL | 3 refills | Status: DC

## 2018-11-17 MED ORDER — RISPERIDONE 2 MG PO TABS: 2.0000 mg | ORAL_TABLET | Freq: Two times a day (BID) | ORAL | 3 refills | Status: DC

## 2018-11-17 MED ORDER — BUSPIRONE HCL 5 MG PO TABS: 5.0000 mg | ORAL_TABLET | Freq: Three times a day (TID) | ORAL | 3 refills | Status: DC

## 2018-11-17 MED ORDER — SENNOSIDES-DOCUSATE SODIUM 8.6-50 MG PO TABS: 2.0000 | ORAL_TABLET | Freq: Every day | ORAL | 1 refills | Status: DC

## 2018-11-17 MED ORDER — SERTRALINE HCL 100 MG PO TABS: 200.0000 mg | ORAL_TABLET | Freq: Every day | ORAL | 3 refills | Status: DC

## 2018-11-17 MED ORDER — IBUPROFEN 600 MG PO TABS: 600.0000 mg | ORAL_TABLET | Freq: Four times a day (QID) | ORAL | 0 refills | Status: DC | PRN

## 2018-11-17 MED ORDER — OXYCODONE HCL 5 MG PO TABS: 5.0000 mg | ORAL_TABLET | ORAL | 0 refills | Status: DC | PRN

## 2018-11-17 NOTE — H&P (View-Only) (Signed)
Consult Note: Gyn-Onc  Consult was requested by Dr. Simona Huh for the evaluation of [name] 63 y.o. female  CC:  Chief Complaint  Patient presents with  . Ovarian mass    Assessment/Plan:  [name]  is a 63 y.o.  year old with a complex left ovarian mass.  There is an elevated Roma score with mildly elevated HD4.  I discussed with the patient and her daughter that there is possibility this is malignancy.  Therefore I am recommending surgical excision with robotic assisted BSO possible staging.  I explained that the majority of these ovarian masses, even with abnormal tumor markers, are benign.  However there is no diagnostic test to ascertain this preoperatively.  I explained an alternative to surgery would be close observation with repeat surveillance imaging in 3 months time and repeated tumor markers.  The patient is not interested in this option.  I explained surgical complication risks particularly risk of damage to adjacent structures such as GI or GU structures.  I explained that this risk is increased in patients who had prior surgery.  I explained risk of VTE, bleeding, skin infection, conversion to laparotomy.  I explained the anticipated postop recovery course of outpatient surgery.   HPI: The patient is a 63 year old woman who was seen by her primary care doctor at [facility] in early August 2020 with reports of increasing weight gain and occasional pelvic pains.  This prompted her having a CT scan of the abdomen and pelvis with contrast that was performed on October 23, 2018.  This revealed status post hysterectomy, no right ovary seen, a complex cystic mass seen in the left adnexa measuring 4.8 x 4.7 x 5 cm.  The left gonadal vein was transiting into this structure suggesting of the left ovary.  There is no ascites, carcinomatosis, or lymphadenopathy seen.  A follow-up scan with an ultrasound of the pelvis was performed on November 03, 2018.  This revealed a surgically  absent uterus.  The right ovary was not visualized.  The left ovary measured 5.1 x 3.7 x 3.3 cm and contained a 4.8 x 3.9 x 5.1 cm cystic lesion containing multiple septations and scattered internal echogenicity.  The septations measured up to 3 mm in thickness.  There was no pelvic free fluid seen.  A Roma score was obtained on November 06, 2018 and was high for postmenopausal score (3.54).  The Ca1 25 was 29.1 which was within normal limits, and the HD4 was slightly elevated at 125 (upper limit of normal 96.5).  The patient has a history of a normal colonoscopy earlier in 2020.  She had a Pap test performed of the vaginal vault on October 27, 2018 that was normal with no high risk HPV detected.  The patient reports a past medical history of cervical dysplasia treated with a total abdominal hysterectomy in the 1980s in [place].  She has a history of a pacemaker placed approximately 4 years ago at [facility].  Her cardiologist is through [facility].  She has irregular beats.  She does not take anticoagulation or antiplatelet therapy.  Her only other significant medical history is for depression.  Her surgical history is remarkable for the prior abdominal hysterectomy, and posterior approach back and neck fusion surgeries.  Family history is remarkable for a maternal aunt who had a diagnosis of multiple myeloma, her daughter is known to be Brekke negative as she had undergone genetic testing for other reasons.  Current Meds:  Outpatient Encounter Medications as  of 11/17/2018  Medication Sig  . ALPRAZolam (XANAX) 1 MG tablet alprazolam 1 mg tablet  TAKE 1 TABLET BY MOUTH 4 TIMES A DAY  . Melatonin 5 MG CAPS Take 5 mg by mouth.  . mirtazapine (REMERON) 45 MG tablet Take 1.5 tablets (67 mg total) by mouth at bedtime.  Marland Kitchen omeprazole (PRILOSEC) 40 MG capsule Take 40 mg by mouth 2 (two) times daily.  . risperiDONE (RISPERDAL) 2 MG tablet Take 0.5 tablets (1 mg total) by mouth 2 (two) times daily.  . rosuvastatin  (CRESTOR) 20 MG tablet Take 1 tablet (20 mg total) by mouth daily.  . sertraline (ZOLOFT) 100 MG tablet Take 2 tablets (200 mg total) by mouth daily.  . [DISCONTINUED] hydrOXYzine (VISTARIL) 25 MG capsule Take 1 capsule (25 mg total) by mouth 3 (three) times daily as needed. (Patient not taking: Reported on 11/17/2018)  . [DISCONTINUED] metoprolol tartrate (LOPRESSOR) 100 MG tablet Take within two hours of the test (Patient not taking: Reported on 11/17/2018)   No facility-administered encounter medications on file as of 11/17/2018.     Allergy:  Allergies  Allergen Reactions  . Penicillins Other (See Comments)    Yeast infection  . Prednisone Other (See Comments)    Bladder infections    Social Hx:   Social History   Socioeconomic History  . Marital status: Married    Spouse name: Not on file  . Number of children: Not on file  . Years of education: Not on file  . Highest education level: Not on file  Occupational History  . Not on file  Social Needs  . Financial resource strain: Not on file  . Food insecurity    Worry: Not on file    Inability: Not on file  . Transportation needs    Medical: Not on file    Non-medical: Not on file  Tobacco Use  . Smoking status: Current Every Day Smoker    Packs/day: 0.75    Years: 40.00    Pack years: 30.00    Types: Cigarettes  . Smokeless tobacco: Never Used  Substance and Sexual Activity  . Alcohol use: No  . Drug use: No  . Sexual activity: Yes  Lifestyle  . Physical activity    Days per week: Not on file    Minutes per session: Not on file  . Stress: Not on file  Relationships  . Social Herbalist on phone: Not on file    Gets together: Not on file    Attends religious service: Not on file    Active member of club or organization: Not on file    Attends meetings of clubs or organizations: Not on file    Relationship status: Not on file  . Intimate partner violence    Fear of current or ex partner: Not on  file    Emotionally abused: Not on file    Physically abused: Not on file    Forced sexual activity: Not on file  Other Topics Concern  . Not on file  Social History Narrative   Married 29 years . Has 3 children. Current smoker -1 pack a day-for 36 years .Alcohol :  1 beer on occasion.   She is soon to be a grandmother.  His before to going on a trip up to the [place] area to be closer to family when the baby is born.  She is extremely excited.   She very much would like to move  back up to that area to be closer to family, but the whether is it just too cold in the winter.    Past Surgical Hx:  Past Surgical History:  Procedure Laterality Date  . ABDOMINAL HYSTERECTOMY  03/19/1980   "partial"  . BACK SURGERY    . CERVICAL FUSION Left 11/2013   C4-C6  . CERVICAL FUSION     3,4,5,  . INSERT / REPLACE / REMOVE PACEMAKER  02/20/2013   medtronic  . LOOP RECORDER IMPLANT N/A 11/06/2012   Procedure: LINQ LOOP RECORDER IMPLANT;  Surgeon: Sanda Klein, MD;  Location: [facility];  Service: Cardiovascular;  Laterality: N/A;  . NECK SURGERY  11/17/2013  . PACEMAKER PLACEMENT N/A 02/16/2013  . PERMANENT PACEMAKER INSERTION N/A 02/20/2013   Procedure: PERMANENT PACEMAKER INSERTION;  Surgeon: Sanda Klein, MD;  Location: [facility];  Service: Cardiovascular;  Laterality: N/A;  . POSTERIOR LUMBAR FUSION  ~ 2009  . SPINAL FUSION  03/19/2014  . TRANSTHORACIC ECHOCARDIOGRAM  07/11/2010   The left atrial size is normal.There is no evidence of mitral vavle prolaspe.Right ventriicular systolic pressure is normal . Injection of contrast documented no interatrial shunt. Essentially normal 2D echo -doppler study     Past Medical Hx:  Past Medical History:  Diagnosis Date  . Anxiety    anxiety and mild depressive order systoms  . Bipolar disorder ([facility])     (02/20/2013)  . Cervical cancer ([facility]) 03/19/1980  . Depression   . Dysrhythmia   . DJTTSVXB(939.0)    "monthly" (02/20/2013)  .  Pacemaker    medtronic  . PONV (postoperative nausea and vomiting)   . Sinus pause 02/14/2013  . Syncope and collapse    echo-April 12,2012-EF 55% Echo normal; recorder with prolonged sinus pauses --> status post  . Tobacco abuse   . Vertigo     Past Gynecological History:   No LMP recorded. Patient has had a hysterectomy.  Family Hx:  Family History  Adopted: Yes  Problem Relation Age of Onset  . Heart attack Brother     Review of Systems:  Constitutional  Feels well,   ENT Normal appearing ears and nares bilaterally Skin/Breast  No rash, sores, jaundice, itching, dryness Cardiovascular  No chest pain, shortness of breath, or edema  Pulmonary  No cough or wheeze.  Gastro Intestinal  No nausea, vomitting, or diarrhoea. No bright red blood per rectum, no abdominal pain, change in bowel movement, or constipation.  Genito Urinary  No frequency, urgency, dysuria, no bleeding Musculo Skeletal  No myalgia, arthralgia, joint swelling or pain  Neurologic  No weakness, numbness, change in gait,  Psychology  No depression, anxiety, insomnia.   Vitals:  Blood pressure (!) 130/93, pulse (!) 120, temperature 98 F (36.7 C), temperature source Temporal, resp. rate 18, height 5' 7.5" (1.715 m), weight 140 lb (63.5 kg), SpO2 97 %.  Physical Exam: WD in NAD Neck  Supple NROM, without any enlargements.  Lymph Node Survey No cervical supraclavicular or inguinal adenopathy Cardiovascular  Pulse normal rate, regularity and rhythm. S1 and S2 normal.  Lungs  Clear to auscultation bilateraly, without wheezes/crackles/rhonchi. Good air movement.  Skin  No rash/lesions/breakdown  Psychiatry  Alert and oriented to person, place, and time  Abdomen  Normoactive bowel sounds, abdomen soft, non-tender and nonobese without evidence of hernia. Back No CVA tenderness Genito Urinary  Vulva/vagina: Normal external female genitalia.  No lesions. No discharge or bleeding.  Bladder/urethra:   No lesions or masses, well supported bladder  Vagina: normal, no lesions  Cervix and uterus surgically absent.  Adnexa: no palpable masses. Rectal  Good tone, no masses no cul de sac nodularity.  Extremities  No bilateral cyanosis, clubbing or edema.   Thereasa Solo, MD  11/17/2018, 10:23 AM

## 2018-11-17 NOTE — Progress Notes (Signed)
Consult Note: Gyn-Onc  Consult was requested by Dr. Simona Kim for the evaluation of [name] 63 y.o. female  CC:  Chief Complaint  Patient presents with  . Ovarian mass    Assessment/Plan:  [name]  is a 63 y.o.  year old with a complex left ovarian mass.  There is an elevated Roma score with mildly elevated HD4.  I discussed with the patient and her daughter that there is possibility this is malignancy.  Therefore I am recommending surgical excision with robotic assisted BSO possible staging.  I explained that the majority of these ovarian masses, even with abnormal tumor markers, are benign.  However there is no diagnostic test to ascertain this preoperatively.  I explained an alternative to surgery would be close observation with repeat surveillance imaging in 3 months time and repeated tumor markers.  The patient is not interested in this option.  I explained surgical complication risks particularly risk of damage to adjacent structures such as GI or GU structures.  I explained that this risk is increased in patients who had prior surgery.  I explained risk of VTE, bleeding, skin infection, conversion to laparotomy.  I explained the anticipated postop recovery course of outpatient surgery.   HPI: The patient is a 63 year old woman who was seen by her primary care doctor at [facility] in early August 2020 with reports of increasing weight gain and occasional pelvic pains.  This prompted her having a CT scan of the abdomen and pelvis with contrast that was performed on October 23, 2018.  This revealed status post hysterectomy, no right ovary seen, a complex cystic mass seen in the left adnexa measuring 4.8 x 4.7 x 5 cm.  The left gonadal vein was transiting into this structure suggesting of the left ovary.  There is no ascites, carcinomatosis, or lymphadenopathy seen.  A follow-up scan with an ultrasound of the pelvis was performed on November 03, 2018.  This revealed a surgically  absent uterus.  The right ovary was not visualized.  The left ovary measured 5.1 x 3.7 x 3.3 cm and contained a 4.8 x 3.9 x 5.1 cm cystic lesion containing multiple septations and scattered internal echogenicity.  The septations measured up to 3 mm in thickness.  There was no pelvic free fluid seen.  A Roma score was obtained on November 06, 2018 and was high for postmenopausal score (3.54).  The Ca1 25 was 29.1 which was within normal limits, and the HD4 was slightly elevated at 125 (upper limit of normal 96.5).  The patient has a history of a normal colonoscopy earlier in 2020.  She had a Pap test performed of the vaginal vault on October 27, 2018 that was normal with no high risk HPV detected.  The patient reports a past medical history of cervical dysplasia treated with a total abdominal hysterectomy in the 1980s in [place].  She has a history of a pacemaker placed approximately 4 years ago at [facility].  Her cardiologist is through [facility].  She has irregular beats.  She does not take anticoagulation or antiplatelet therapy.  Her only other significant medical history is for depression.  Her surgical history is remarkable for the prior abdominal hysterectomy, and posterior approach back and neck fusion surgeries.  Family history is remarkable for a maternal aunt who had a diagnosis of multiple myeloma, her daughter is known to be Brekke negative as she had undergone genetic testing for other reasons.  Current Meds:  Outpatient Encounter Medications as  of 11/17/2018  Medication Sig  . ALPRAZolam (XANAX) 1 MG tablet alprazolam 1 mg tablet  TAKE 1 TABLET BY MOUTH 4 TIMES A DAY  . Melatonin 5 MG CAPS Take 5 mg by mouth.  . mirtazapine (REMERON) 45 MG tablet Take 1.5 tablets (67 mg total) by mouth at bedtime.  . omeprazole (PRILOSEC) 40 MG capsule Take 40 mg by mouth 2 (two) times daily.  . risperiDONE (RISPERDAL) 2 MG tablet Take 0.5 tablets (1 mg total) by mouth 2 (two) times daily.  . rosuvastatin  (CRESTOR) 20 MG tablet Take 1 tablet (20 mg total) by mouth daily.  . sertraline (ZOLOFT) 100 MG tablet Take 2 tablets (200 mg total) by mouth daily.  . [DISCONTINUED] hydrOXYzine (VISTARIL) 25 MG capsule Take 1 capsule (25 mg total) by mouth 3 (three) times daily as needed. (Patient not taking: Reported on 11/17/2018)  . [DISCONTINUED] metoprolol tartrate (LOPRESSOR) 100 MG tablet Take within two hours of the test (Patient not taking: Reported on 11/17/2018)   No facility-administered encounter medications on file as of 11/17/2018.     Allergy:  Allergies  Allergen Reactions  . Penicillins Other (See Comments)    Yeast infection  . Prednisone Other (See Comments)    Bladder infections    Social Hx:   Social History   Socioeconomic History  . Marital status: Married    Spouse name: Not on file  . Number of children: Not on file  . Years of education: Not on file  . Highest education level: Not on file  Occupational History  . Not on file  Social Needs  . Financial resource strain: Not on file  . Food insecurity    Worry: Not on file    Inability: Not on file  . Transportation needs    Medical: Not on file    Non-medical: Not on file  Tobacco Use  . Smoking status: Current Every Day Smoker    Packs/day: 0.75    Years: 40.00    Pack years: 30.00    Types: Cigarettes  . Smokeless tobacco: Never Used  Substance and Sexual Activity  . Alcohol use: No  . Drug use: No  . Sexual activity: Yes  Lifestyle  . Physical activity    Days per week: Not on file    Minutes per session: Not on file  . Stress: Not on file  Relationships  . Social connections    Talks on phone: Not on file    Gets together: Not on file    Attends religious service: Not on file    Active member of club or organization: Not on file    Attends meetings of clubs or organizations: Not on file    Relationship status: Not on file  . Intimate partner violence    Fear of current or ex partner: Not on  file    Emotionally abused: Not on file    Physically abused: Not on file    Forced sexual activity: Not on file  Other Topics Concern  . Not on file  Social History Narrative   Married 29 years . Has 3 children. Current smoker -1 pack a day-for 36 years .Alcohol :  1 beer on occasion.   She is soon to be a grandmother.  His before to going on a trip up to the [place] area to be closer to family when the baby is born.  She is extremely excited.   She very much would like to move   back up to that area to be closer to family, but the whether is it just too cold in the winter.    Past Surgical Hx:  Past Surgical History:  Procedure Laterality Date  . ABDOMINAL HYSTERECTOMY  03/19/1980   "partial"  . BACK SURGERY    . CERVICAL FUSION Left 11/2013   C4-C6  . CERVICAL FUSION     3,4,5,  . INSERT / REPLACE / REMOVE PACEMAKER  02/20/2013   medtronic  . LOOP RECORDER IMPLANT N/A 11/06/2012   Procedure: LINQ LOOP RECORDER IMPLANT;  Surgeon: Sanda Klein, MD;  Location: [facility];  Service: Cardiovascular;  Laterality: N/A;  . NECK SURGERY  11/17/2013  . PACEMAKER PLACEMENT N/A 02/16/2013  . PERMANENT PACEMAKER INSERTION N/A 02/20/2013   Procedure: PERMANENT PACEMAKER INSERTION;  Surgeon: Sanda Klein, MD;  Location: [facility];  Service: Cardiovascular;  Laterality: N/A;  . POSTERIOR LUMBAR FUSION  ~ 2009  . SPINAL FUSION  03/19/2014  . TRANSTHORACIC ECHOCARDIOGRAM  07/11/2010   The left atrial size is normal.There is no evidence of mitral vavle prolaspe.Right ventriicular systolic pressure is normal . Injection of contrast documented no interatrial shunt. Essentially normal 2D echo -doppler study     Past Medical Hx:  Past Medical History:  Diagnosis Date  . Anxiety    anxiety and mild depressive order systoms  . Bipolar disorder ([facility])     (02/20/2013)  . Cervical cancer ([facility]) 03/19/1980  . Depression   . Dysrhythmia   . DJTTSVXB(939.0)    "monthly" (02/20/2013)  .  Pacemaker    medtronic  . PONV (postoperative nausea and vomiting)   . Sinus pause 02/14/2013  . Syncope and collapse    echo-April 12,2012-EF 55% Echo normal; recorder with prolonged sinus pauses --> status post  . Tobacco abuse   . Vertigo     Past Gynecological History:   No LMP recorded. Patient has had a hysterectomy.  Family Hx:  Family History  Adopted: Yes  Problem Relation Age of Onset  . Heart attack Brother     Review of Systems:  Constitutional  Feels well,   ENT Normal appearing ears and nares bilaterally Skin/Breast  No rash, sores, jaundice, itching, dryness Cardiovascular  No chest pain, shortness of breath, or edema  Pulmonary  No cough or wheeze.  Gastro Intestinal  No nausea, vomitting, or diarrhoea. No bright red blood per rectum, no abdominal pain, change in bowel movement, or constipation.  Genito Urinary  No frequency, urgency, dysuria, no bleeding Musculo Skeletal  No myalgia, arthralgia, joint swelling or pain  Neurologic  No weakness, numbness, change in gait,  Psychology  No depression, anxiety, insomnia.   Vitals:  Blood pressure (!) 130/93, pulse (!) 120, temperature 98 F (36.7 C), temperature source Temporal, resp. rate 18, height 5' 7.5" (1.715 m), weight 140 lb (63.5 kg), SpO2 97 %.  Physical Exam: WD in NAD Neck  Supple NROM, without any enlargements.  Lymph Node Survey No cervical supraclavicular or inguinal adenopathy Cardiovascular  Pulse normal rate, regularity and rhythm. S1 and S2 normal.  Lungs  Clear to auscultation bilateraly, without wheezes/crackles/rhonchi. Good air movement.  Skin  No rash/lesions/breakdown  Psychiatry  Alert and oriented to person, place, and time  Abdomen  Normoactive bowel sounds, abdomen soft, non-tender and nonobese without evidence of hernia. Back No CVA tenderness Genito Urinary  Vulva/vagina: Normal external female genitalia.  No lesions. No discharge or bleeding.  Bladder/urethra:   No lesions or masses, well supported bladder  Vagina: normal, no lesions  Cervix and uterus surgically absent.  Adnexa: no palpable masses. Rectal  Good tone, no masses no cul de sac nodularity.  Extremities  No bilateral cyanosis, clubbing or edema.   Thereasa Solo, MD  11/17/2018, 10:23 AM

## 2018-11-17 NOTE — Patient Instructions (Signed)
Preparing for your Surgery  Plan for surgery on November 25, 2018 with Dr. Everitt Amber at [place] will be scheduled for a robotic assisted bilateral salpingo-oophorectomy, possible staging.   Pre-operative Testing -You will receive a phone call from presurgical testing at [facility] if you have not received a call already to arrange for a pre-operative testing appointment before your surgery.  This appointment normally occurs one to two weeks before your scheduled surgery.   -Bring your insurance card, copy of an advanced directive if applicable, medication list  -At that visit, you will be asked to sign a consent for a possible blood transfusion in case a transfusion becomes necessary during surgery.  The need for a blood transfusion is rare but having consent is a necessary part of your care.     -You should not be taking blood thinners or aspirin at least ten days prior to surgery unless instructed by your surgeon.  -Do not take supplements such as fish oil (omega 3), red yeast rice, tumeric before your surgery.  Day Before Surgery at [place] will be asked to take in a light diet the day before surgery.  Avoid carbonated beverages.  You will be advised to have nothing to eat or drink after midnight the evening before.    Eat a light diet the day before surgery.  Examples including soups, broths, toast, yogurt, mashed potatoes.  Things to avoid include carbonated beverages (fizzy beverages), raw fruits and raw vegetables, or beans.   If your bowels are filled with gas, your surgeon will have difficulty visualizing your pelvic organs which increases your surgical risks.  Your role in recovery Your role is to become active as soon as directed by your doctor, while still giving yourself time to heal.  Rest when you feel tired. You will be asked to do the following in order to speed your recovery:  - Cough and breathe deeply. This helps toclear and expand your  lungs and can prevent pneumonia. You may be given a spirometer to practice deep breathing. A staff member will show you how to use the spirometer. - Do mild physical activity. Walking or moving your legs help your circulation and body functions return to normal. A staff member will help you when you try to walk and will provide you with simple exercises. Do not try to get up or walk alone the first time. - Actively manage your pain. Managing your pain lets you move in comfort. We will ask you to rate your pain on a scale of zero to 10. It is your responsibility to tell your doctor or nurse where and how much you hurt so your pain can be treated.  Special Considerations -If you are diabetic, you may be placed on insulin after surgery to have closer control over your blood sugars to promote healing and recovery.  This does not mean that you will be discharged on insulin.  If applicable, your oral antidiabetics will be resumed when you are tolerating a solid diet.  -Your final pathology results from surgery should be available around one week after surgery and the results will be relayed to you when available.  -Dr. Lahoma Crocker is the surgeon that assists your GYN Oncologist with surgery.  If you end up staying the night, the next day after your surgery you will either see Dr. Denman George or Dr. Lahoma Crocker.  -FMLA forms can be faxed to [phone] and please allow 5-7 business days for completion.  Pain Management After Surgery -You have been prescribed your pain medication and bowel regimen medications before surgery so that you can have these available when you are discharged from the hospital. The pain medication is for use ONLY AFTER surgery and a new prescription will not be given.   -Make sure that you have Tylenol and Ibuprofen at home to use on a regular basis after surgery for pain control. We recommend alternating the medications every hour to six hours since they work differently  and are processed in the body differently for pain relief.  -Review the attached handout on narcotic use and their risks and side effects.   Bowel Regimen -You have been prescribed Sennakot-S to take nightly to prevent constipation especially if you are taking the narcotic pain medication intermittently.  It is important to prevent constipation and drink adequate amounts of liquids.  Blood Transfusion Information WHAT IS A BLOOD TRANSFUSION? A transfusion is the replacement of blood or some of its parts. Blood is made up of multiple cells which provide different functions.  Red blood cells carry oxygen and are used for blood loss replacement.  White blood cells fight against infection.  Platelets control bleeding.  Plasma helps clot blood.  Other blood products are available for specialized needs, such as hemophilia or other clotting disorders. BEFORE THE TRANSFUSION  Who gives blood for transfusions?   You may be able to donate blood to be used at a later date on yourself (autologous donation).  Relatives can be asked to donate blood. This is generally not any safer than if you have received blood from a stranger. The same precautions are taken to ensure safety when a relative's blood is donated.  Healthy volunteers who are fully evaluated to make sure their blood is safe. This is blood bank blood. Transfusion therapy is the safest it has ever been in the practice of medicine. Before blood is taken from a donor, a complete history is taken to make sure that person has no history of diseases nor engages in risky social behavior (examples are intravenous drug use or sexual activity with multiple partners). The donor's travel history is screened to minimize risk of transmitting infections, such as malaria. The donated blood is tested for signs of infectious diseases, such as HIV and hepatitis. The blood is then tested to be sure it is compatible with you in order to minimize the chance of a  transfusion reaction. If you or a relative donates blood, this is often done in anticipation of surgery and is not appropriate for emergency situations. It takes many days to process the donated blood. RISKS AND COMPLICATIONS Although transfusion therapy is very safe and saves many lives, the main dangers of transfusion include:   Getting an infectious disease.  Developing a transfusion reaction. This is an allergic reaction to something in the blood you were given. Every precaution is taken to prevent this. The decision to have a blood transfusion has been considered carefully by your caregiver before blood is given. Blood is not given unless the benefits outweigh the risks.  AFTER SURGERY CONSIDERATIONS 11/17/2018  Return to work: 4-6 weeks if applicable  Activity: 1. Be up and out of the bed during the day.  Take a nap if needed.  You may walk up steps but be careful and use the hand rail.  Stair climbing will tire you more than you think, you may need to stop part way and rest.   2. No lifting or straining for 6  weeks.  3. No driving for 1 week(s).  Do not drive if you are taking narcotic pain medicine.  4. Shower daily.  Use soap and water on your incision and pat dry; don't rub.  No tub baths until cleared by your surgeon.   5. No sexual activity and nothing in the vagina for 2 weeks.  6. You may experience a small amount of clear drainage from your incisions, which is normal.  If the drainage persists or increases, please call the office.  7. You may experience vaginal spotting after surgery or around the 6-8 week mark from surgery when the stitches at the top of the vagina begin to dissolve.  The spotting is normal but if you experience heavy bleeding, call our office.  8. Take Tylenol or ibuprofen first for pain and only use NARCOTIC PAIN MEDS for severe pain not relieved by the Tylenol or Ibuprofen.  Monitor your Tylenol intake to a max of 4,000 mg a day.  Diet: 1. Low sodium  Heart Healthy Diet is recommended.  2. It is safe to use a laxative, such as Miralax or Colace, if you have difficulty moving your bowels. You can take Sennakot at bedtime every evening to keep bowel movements regular and to prevent constipation.    Wound Care: 1. Keep clean and dry.  Shower daily.  Reasons to call the Doctor:  Fever - Oral temperature greater than 100.4 degrees Fahrenheit  Foul-smelling vaginal discharge  Difficulty urinating  Nausea and vomiting  Increased pain at the site of the incision that is unrelieved with pain medicine.  Difficulty breathing with or without chest pain  New calf pain especially if only on one side  Sudden, continuing increased vaginal bleeding with or without clots.   Contacts: For questions or concerns you should contact:  Dr. Everitt Amber at [phone]  Joylene John, NP at [phone]  After Hours: call [phone] and have the GYN Oncologist paged/contacted

## 2018-11-17 NOTE — Progress Notes (Signed)
[name] [id number] 61 y.o.  Virtual Visit via Telephone Note  I connected with pt on 11/17/18 at  2:30 PM EDT by telephone and verified that I am speaking with the correct person using two identifiers.   I discussed the limitations, risks, security and privacy concerns of performing an evaluation and management service by telephone and the availability of in person appointments. I also discussed with the patient that there may be a patient responsible charge related to this service. The patient expressed understanding and agreed to proceed.   I discussed the assessment and treatment plan with the patient. The patient was provided an opportunity to ask questions and all were answered. The patient agreed with the plan and demonstrated an understanding of the instructions.   The patient was advised to call back or seek an in-person evaluation if the symptoms worsen or if the condition fails to improve as anticipated.  I provided 30 minutes of non-face-to-face time during this encounter.  The patient was located at home.  The provider was located at [place].   Aloha Gell, NP   Subjective:   Patient ID:  [name] is a 63 y.o. (DOB July 06, 1955) female.  Chief Complaint:  Chief Complaint  Patient presents with  . Anxiety  . Depression    HPI [name] presents for follow-up of depression, anxiety, and insomnia.    Describes mood today as "ok". Pleasant. Flat. Mood symptoms - denies depression, anxiety, and irritability. Stating "I'm doing alright". Is worried about having cancer. Was seen at the [place] center today and is scheduled for surgery next week - has a "mass" in her abdomen. Difficulties keeping herself calm. Stating "I've already got myself in the grave". Stating "I don't like to think the worst, but I am". Struggling with the "unknown". Does not feel like Xanax is helping enough for the increased anxiety she has. Stable  interest and motivation. Taking medications as prescribed.  Energy levels low. Active, does not have a regular exericise routine.  Enjoys some usual interests and activities. Spending time with family - lives alone. Widowed 4 years ago in May - married 34 years. Daughter lives 10 minutes away. 2 children up living up [place]. Appetite adequate. Weight gain - 15 pounds.  Sleeps well most nights. Averages 7 to 8 hours. Focus and concentration stable. Completing tasks. Managing aspects of household.  Denies SI or HI. Denies AH or VH.  Review of Systems:  Review of Systems  Musculoskeletal: Negative for gait problem.  Neurological: Negative for tremors and weakness.  Psychiatric/Behavioral:       Please refer to HPI    Medications: I have reviewed the patient's current medications.  Current Outpatient Medications  Medication Sig Dispense Refill  . ALPRAZolam (XANAX) 1 MG tablet alprazolam 1 mg tablet  TAKE 1 TABLET BY MOUTH 4 TIMES A DAY 120 tablet 3  . ibuprofen (ADVIL) 600 MG tablet Take 1 tablet (600 mg total) by mouth every 6 (six) hours as needed for moderate pain. For AFTER surgery only 30 tablet 0  . mirtazapine (REMERON) 45 MG tablet Take 1.5 tablets (67 mg total) by mouth at bedtime. 45 tablet 3  . omeprazole (PRILOSEC) 40 MG capsule Take 40 mg by mouth 2 (two) times daily.  5  . oxyCODONE (OXY IR/ROXICODONE) 5 MG immediate release tablet Take 1 tablet (5 mg total) by mouth every 4 (four) hours as needed for severe pain. For AFTER surgery, do not  take and drive 10 tablet 0  . risperiDONE (RISPERDAL) 2 MG tablet Take 1 tablet (2 mg total) by mouth 2 (two) times daily. 60 tablet 3  . sertraline (ZOLOFT) 100 MG tablet Take 2 tablets (200 mg total) by mouth daily. 60 tablet 3  . busPIRone (BUSPAR) 5 MG tablet Take 1 tablet (5 mg total) by mouth 3 (three) times daily. 90 tablet 3  . Melatonin 5 MG CAPS Take 5 mg by mouth.    . rosuvastatin (CRESTOR) 20 MG tablet Take 1 tablet (20 mg total)  by mouth daily. (Patient not taking: Reported on 11/17/2018) 90 tablet 3  . senna-docusate (SENOKOT-S) 8.6-50 MG tablet Take 2 tablets by mouth at bedtime. For AFTER surgery, do not take if having diarrhea (Patient not taking: Reported on 11/17/2018) 30 tablet 1   No current facility-administered medications for this visit.     Medication Side Effects: None  Allergies:  Allergies  Allergen Reactions  . Penicillins Other (See Comments)    Yeast infection  . Prednisone Other (See Comments)    Bladder infections    Past Medical History:  Diagnosis Date  . Anxiety    anxiety and mild depressive order systoms  . Bipolar disorder ([place])     (02/20/2013)  . Cervical cancer ([place]) 03/19/1980  . Depression   . Dysrhythmia   . [id number])    "monthly" (02/20/2013)  . Pacemaker    medtronic  . PONV (postoperative nausea and vomiting)   . Sinus pause 02/14/2013  . Syncope and collapse    echo-April 12,2012-EF 55% Echo normal; recorder with prolonged sinus pauses --> status post  . Tobacco abuse   . Vertigo     Family History  Adopted: Yes  Problem Relation Age of Onset  . Heart attack Brother     Social History   Socioeconomic History  . Marital status: Widowed    Spouse name: Not on file  . Number of children: Not on file  . Years of education: Not on file  . Highest education level: Not on file  Occupational History  . Not on file  Social Needs  . Financial resource strain: Not on file  . Food insecurity    Worry: Not on file    Inability: Not on file  . Transportation needs    Medical: Not on file    Non-medical: Not on file  Tobacco Use  . Smoking status: Current Every Day Smoker    Packs/day: 0.75    Years: 40.00    Pack years: 30.00    Types: Cigarettes  . Smokeless tobacco: Never Used  Substance and Sexual Activity  . Alcohol use: No  . Drug use: No  . Sexual activity: Yes  Lifestyle  . Physical activity    Days per week: Not on file    Minutes per  session: Not on file  . Stress: Not on file  Relationships  . Social Herbalist on phone: Not on file    Gets together: Not on file    Attends religious service: Not on file    Active member of club or organization: Not on file    Attends meetings of clubs or organizations: Not on file    Relationship status: Not on file  . Intimate partner violence    Fear of current or ex partner: Not on file    Emotionally abused: Not on file    Physically abused: Not on file    Forced  sexual activity: Not on file  Other Topics Concern  . Not on file  Social History Narrative   Married 29 years . Has 3 children. Current smoker -1 pack a day-for 36 years .Alcohol :  1 beer on occasion.   She is soon to be a grandmother.  His before to going on a trip up to the [place] area to be closer to family when the baby is born.  She is extremely excited.   She very much would like to move back up to that area to be closer to family, but the whether is it just too cold in the winter.    Past Medical History, Surgical history, Social history, and Family history were reviewed and updated as appropriate.   Please see review of systems for further details on the patient's review from today.   Objective:   Physical Exam:  There were no vitals taken for this visit.  Physical Exam Psychiatric:        Attention and Perception: Attention normal.        Mood and Affect: Mood is anxious.        Speech: Speech normal.        Behavior: Behavior normal.        Thought Content: Thought content normal.        Cognition and Memory: Cognition normal.        Judgment: Judgment normal.     Lab Review:     Component Value Date/Time   NA 138 09/24/2018 1019   K 4.0 09/24/2018 1019   CL 103 09/24/2018 1019   CO2 27 09/24/2018 1019   GLUCOSE 115 (H) 09/24/2018 1019   BUN 9 09/24/2018 1019   CREATININE 1.08 (H) 09/24/2018 1019   CALCIUM 10.0 09/24/2018 1019   PROT 6.8 02/16/2013 1345    ALBUMIN 4.6 02/16/2013 1345   AST 12 02/16/2013 1345   ALT <8 02/16/2013 1345   ALKPHOS 54 02/16/2013 1345   BILITOT 0.7 02/16/2013 1345   GFRNONAA 56 (L) 09/24/2018 1019   GFRAA 65 09/24/2018 1019       Component Value Date/Time   WBC 7.5 04/09/2014 1136   RBC 4.80 04/09/2014 1136   HGB 14.0 04/09/2014 1136   HCT 42.3 04/09/2014 1136   PLT 220 04/09/2014 1136   MCV 88.1 04/09/2014 1136   MCH 29.2 04/09/2014 1136   MCHC 33.1 04/09/2014 1136   RDW 14.3 04/09/2014 1136    No results found for: POCLITH, LITHIUM   No results found for: PHENYTOIN, PHENOBARB, VALPROATE, CBMZ   .res Assessment: Plan:    Plan:  1. Xanax 1mg  - 4 x daily 2. Risperdal 2 mg BID 3. Zoloft 100mg  daily - 2 daily 4. Remeron 45mg  - 1 and 1/2 tabs daily 5. Add Buspar 5mg  TID to target increased   RTC 3 months  Patient advised to contact office with any questions, adverse effects, or acute worsening in signs and symptoms.  Discussed potential benefits, risk, and side effects of benzodiazepines to include potential risk of tolerance and dependence, as well as possible drowsiness.  Advised patient not to drive if experiencing drowsiness and to take lowest possible effective dose to minimize risk of dependence and tolerance.  Discussed potential metabolic side effects associated with atypical antipsychotics, as well as potential risk for movement side effects. Advised pt to contact office if movement side effects occur.   [name] was seen today for anxiety and depression.  Diagnoses and all orders for  this visit:  Generalized anxiety disorder -     ALPRAZolam (XANAX) 1 MG tablet; alprazolam 1 mg tablet  TAKE 1 TABLET BY MOUTH 4 TIMES A DAY -     risperiDONE (RISPERDAL) 2 MG tablet; Take 1 tablet (2 mg total) by mouth 2 (two) times daily. -     sertraline (ZOLOFT) 100 MG tablet; Take 2 tablets (200 mg total) by mouth daily. -     busPIRone (BUSPAR) 5 MG tablet; Take 1 tablet (5 mg total) by mouth 3  (three) times daily.  Other insomnia -     mirtazapine (REMERON) 45 MG tablet; Take 1.5 tablets (67 mg total) by mouth at bedtime. -     risperiDONE (RISPERDAL) 2 MG tablet; Take 1 tablet (2 mg total) by mouth 2 (two) times daily.  Major depressive disorder, recurrent episode, moderate (HCC) -     risperiDONE (RISPERDAL) 2 MG tablet; Take 1 tablet (2 mg total) by mouth 2 (two) times daily. -     sertraline (ZOLOFT) 100 MG tablet; Take 2 tablets (200 mg total) by mouth daily.  Insomnia, unspecified type -     risperiDONE (RISPERDAL) 2 MG tablet; Take 1 tablet (2 mg total) by mouth 2 (two) times daily.    Please see After Visit Summary for patient specific instructions.  Future Appointments  Date Time Provider [place]  12/16/2018  1:30 PM [facility] 1 [facility]  12/19/2018  3:15 PM Everitt Amber, MD [facility] None  01/20/2019  7:10 AM [facility] DEVICE REMOTES [facility]  04/21/2019  7:10 AM [facility] DEVICE REMOTES [facility]  07/21/2019  7:10 AM [facility] DEVICE REMOTES [facility]  10/20/2019  7:10 AM [facility] DEVICE REMOTES [facility]  01/19/2020  7:10 AM [facility] DEVICE REMOTES [facility]    No orders of the defined types were placed in this encounter.     -------------------------------

## 2018-11-18 DIAGNOSIS — Z0001 Encounter for general adult medical examination with abnormal findings: Secondary | ICD-10-CM | POA: Diagnosis not present

## 2018-11-20 ENCOUNTER — Telehealth: Payer: Self-pay

## 2018-11-20 DIAGNOSIS — M961 Postlaminectomy syndrome, not elsewhere classified: Secondary | ICD-10-CM | POA: Diagnosis not present

## 2018-11-20 DIAGNOSIS — M5416 Radiculopathy, lumbar region: Secondary | ICD-10-CM | POA: Diagnosis not present

## 2018-11-20 NOTE — Telephone Encounter (Signed)
Pt called asking if she can travel via car to [place] the same day as surgery.  I told her absolutely not. This would put her at higher risk for blood clot formation, traveling in a vehicle would cause increased discomfort.  I also advised her that she would be far from here, if complication would occur after surgery Dr. Denman George would not be able to attend to her.

## 2018-11-20 NOTE — Progress Notes (Signed)
PCP - Barney Drain  Cardiologist - Dr. Sallyanne Kuster LOV 09-15-18  Chest x-ray - 07-16-18  EKG - 09-15-18 Stress Test -  ECHO -  Cardiac Cath -   Sleep Study -  CPAP -   Fasting Blood Sugar -  Checks Blood Sugar _____ times a day  Blood Thinner Instructions:  Aspirin Instructions:  Last Dose:  Anesthesia review: Pacemaker  Patient denies shortness of breath, fever, cough and chest pain at PAT appointment   Patient verbalized understanding of instructions that were given to them at the PAT appointment. Patient was also instructed that they will need to review over the PAT instructions again at home before surgery.

## 2018-11-20 NOTE — Patient Instructions (Addendum)
DUE TO COVID-19 ONLY ONE VISITOR IS ALLOWED TO COME WITH YOU AND STAY IN THE WAITING ROOM ONLY DURING PRE OP AND PROCEDURE DAY OF SURGERY. THE 1 VISITOR MAY VISIT WITH YOU AFTER SURGERY IN YOUR PRIVATE ROOM DURING VISITING HOURS ONLY!  YOU NEED TO HAVE A COVID 19 TEST ON 11-21-18 @ 10:30 AM, THIS TEST MUST BE DONE BEFORE SURGERY, COME  [place] , [id number].  ([place]) ONCE YOUR COVID TEST IS COMPLETED, PLEASE BEGIN THE QUARANTINE INSTRUCTIONS AS OUTLINED IN YOUR HANDOUT.                [name]  11/20/2018   Your procedure is scheduled on: 11-25-18    Report to [facility] Main  Entrance    Report to Short Stay at 5:30 AM     Call this number if you have problems the morning of surgery [phone]    Eat a light diet the day before surgery.  Examples including soups, broths, toast, yogurt, mashed potatoes.  Things to avoid include carbonated beverages (fizzy beverages), raw fruits and raw vegetables, or beans.   If your bowels are filled with gas, your surgeon will have difficulty visualizing your pelvic organs which increases your surgical risks.   After Midnight. You may have Clear Liquid Diet until 4:30AM. After 4:30 AM, nothing until after surgery.     CLEAR LIQUID DIET   Foods Allowed                                                                     Foods Excluded  Coffee and tea, regular and decaf                             liquids that you cannot  Plain Jell-O any favor except red or purple                                           see through such as: Fruit ices (not with fruit pulp)                                     milk, soups, orange juice  Iced Popsicles                                    All solid food                                  Cranberry, grape and apple juices Sports drinks like Gatorade Lightly seasoned clear broth or consume(fat free) Sugar, honey  syrup   _____________________________________________________________________   Take these medicines the morning of surgery with A SIP OF WATER: Alprazolam (Xanax)  BRUSH YOUR TEETH MORNING OF SURGERY AND RINSE YOUR MOUTH OUT, NO CHEWING GUM CANDY OR MINTS.  You may not have any metal on your body including hair pins and              piercings     Do not wear jewelry, make-up, lotions, powders or perfumes, deodorant              Do not wear nail polish.  Do not shave  48 hours prior to surgery.                 Do not bring valuables to the hospital. [place].  Contacts, dentures or bridgework may not be worn into surgery.       Patients discharged the day of surgery will not be allowed to drive home. IF YOU ARE HAVING SURGERY AND GOING HOME THE SAME DAY, YOU MUST HAVE AN ADULT TO DRIVE YOU HOME AND BE WITH YOU FOR 24 HOURS. YOU MAY GO HOME BY TAXI OR UBER OR ORTHERWISE, BUT AN ADULT MUST ACCOMPANY YOU HOME AND STAY WITH YOU FOR 24 HOURS.  Name and phone number of your driver:  [name] [phone]                 Please read over the following fact sheets you were given: _____________________________________________________________________             [facility] - Preparing for Surgery Before surgery, you can play an important role.  Because skin is not sterile, your skin needs to be as free of germs as possible.  You can reduce the number of germs on your skin by washing with CHG (chlorahexidine gluconate) soap before surgery.  CHG is an antiseptic cleaner which kills germs and bonds with the skin to continue killing germs even after washing. Please DO NOT use if you have an allergy to CHG or antibacterial soaps.  If your skin becomes reddened/irritated stop using the CHG and inform your nurse when you arrive at Short Stay. Do not shave (including legs and underarms) for at least 48 hours prior  to the first CHG shower.  You may shave your face/neck. Please follow these instructions carefully:  1.  Shower with CHG Soap the night before surgery and the  morning of Surgery.  2.  If you choose to wash your hair, wash your hair first as usual with your  normal  shampoo.  3.  After you shampoo, rinse your hair and body thoroughly to remove the  shampoo.                           4.  Use CHG as you would any other liquid soap.  You can apply chg directly  to the skin and wash                       Gently with a scrungie or clean washcloth.  5.  Apply the CHG Soap to your body ONLY FROM THE NECK DOWN.   Do not use on face/ open                           Wound or open sores. Avoid contact with eyes, ears mouth and genitals (private parts).  Wash face,  Genitals (private parts) with your normal soap.             6.  Wash thoroughly, paying special attention to the area where your surgery  will be performed.  7.  Thoroughly rinse your body with warm water from the neck down.  8.  DO NOT shower/wash with your normal soap after using and rinsing off  the CHG Soap.                9.  Pat yourself dry with a clean towel.            10.  Wear clean pajamas.            11.  Place clean sheets on your bed the night of your first shower and do not  sleep with pets. Day of Surgery : Do not apply any lotions/deodorants the morning of surgery.  Please wear clean clothes to the hospital/surgery center.  FAILURE TO FOLLOW THESE INSTRUCTIONS MAY RESULT IN THE CANCELLATION OF YOUR SURGERY PATIENT SIGNATURE_________________________________  NURSE SIGNATURE__________________________________  ________________________________________________________________________   [name]  An incentive spirometer is a tool that can help keep your lungs clear and active. This tool measures how well you are filling your lungs with each breath. Taking long deep breaths may help reverse or  decrease the chance of developing breathing (pulmonary) problems (especially infection) following:  A long period of time when you are unable to move or be active. BEFORE THE PROCEDURE   If the spirometer includes an indicator to show your best effort, your nurse or respiratory therapist will set it to a desired goal.  If possible, sit up straight or lean slightly forward. Try not to slouch.  Hold the incentive spirometer in an upright position. INSTRUCTIONS FOR USE  1. Sit on the edge of your bed if possible, or sit up as far as you can in bed or on a chair. 2. Hold the incentive spirometer in an upright position. 3. Breathe out normally. 4. Place the mouthpiece in your mouth and seal your lips tightly around it. 5. Breathe in slowly and as deeply as possible, raising the piston or the ball toward the top of the column. 6. Hold your breath for 3-5 seconds or for as long as possible. Allow the piston or ball to fall to the bottom of the column. 7. Remove the mouthpiece from your mouth and breathe out normally. 8. Rest for a few seconds and repeat Steps 1 through 7 at least 10 times every 1-2 hours when you are awake. Take your time and take a few normal breaths between deep breaths. 9. The spirometer may include an indicator to show your best effort. Use the indicator as a goal to work toward during each repetition. 10. After each set of 10 deep breaths, practice coughing to be sure your lungs are clear. If you have an incision (the cut made at the time of surgery), support your incision when coughing by placing a pillow or rolled up towels firmly against it. Once you are able to get out of bed, walk around indoors and cough well. You may stop using the incentive spirometer when instructed by your caregiver.  RISKS AND COMPLICATIONS  Take your time so you do not get dizzy or light-headed.  If you are in pain, you may need to take or ask for pain medication before doing incentive spirometry.  It is harder to take a deep breath if you are having  pain. AFTER USE  Rest and breathe slowly and easily.  It can be helpful to keep track of a log of your progress. Your caregiver can provide you with a simple table to help with this. If you are using the spirometer at home, follow these instructions: [place] IF:   You are having difficultly using the spirometer.  You have trouble using the spirometer as often as instructed.  Your pain medication is not giving enough relief while using the spirometer.  You develop fever of 100.5 F (38.1 C) or higher. SEEK IMMEDIATE MEDICAL CARE IF:   You cough up bloody sputum that had not been present before.  You develop fever of 102 F (38.9 C) or greater.  You develop worsening pain at or near the incision site. MAKE SURE YOU:   Understand these instructions.  Will watch your condition.  Will get help right away if you are not doing well or get worse. Document Released: 07/16/2006 Document Revised: 05/28/2011 Document Reviewed: 09/16/2006 ExitCare Patient Information 2014 ExitCare, [place].   ________________________________________________________________________  WHAT IS A BLOOD TRANSFUSION? Blood Transfusion Information  A transfusion is the replacement of blood or some of its parts. Blood is made up of multiple cells which provide different functions.  Red blood cells carry oxygen and are used for blood loss replacement.  White blood cells fight against infection.  Platelets control bleeding.  Plasma helps clot blood.  Other blood products are available for specialized needs, such as hemophilia or other clotting disorders. BEFORE THE TRANSFUSION  Who gives blood for transfusions?   Healthy volunteers who are fully evaluated to make sure their blood is safe. This is blood bank blood. Transfusion therapy is the safest it has ever been in the practice of medicine. Before blood is taken from a donor, a complete  history is taken to make sure that person has no history of diseases nor engages in risky social behavior (examples are intravenous drug use or sexual activity with multiple partners). The donor's travel history is screened to minimize risk of transmitting infections, such as malaria. The donated blood is tested for signs of infectious diseases, such as HIV and hepatitis. The blood is then tested to be sure it is compatible with you in order to minimize the chance of a transfusion reaction. If you or a relative donates blood, this is often done in anticipation of surgery and is not appropriate for emergency situations. It takes many days to process the donated blood. RISKS AND COMPLICATIONS Although transfusion therapy is very safe and saves many lives, the main dangers of transfusion include:   Getting an infectious disease.  Developing a transfusion reaction. This is an allergic reaction to something in the blood you were given. Every precaution is taken to prevent this. The decision to have a blood transfusion has been considered carefully by your caregiver before blood is given. Blood is not given unless the benefits outweigh the risks. AFTER THE TRANSFUSION  Right after receiving a blood transfusion, you will usually feel much better and more energetic. This is especially true if your red blood cells have gotten low (anemic). The transfusion raises the level of the red blood cells which carry oxygen, and this usually causes an energy increase.  The nurse administering the transfusion will monitor you carefully for complications. HOME CARE INSTRUCTIONS  No special instructions are needed after a transfusion. You may find your energy is better. Speak with your caregiver about any limitations on activity for underlying diseases  you may have. SEEK MEDICAL CARE IF:   Your condition is not improving after your transfusion.  You develop redness or irritation at the intravenous (IV) site. SEEK  IMMEDIATE MEDICAL CARE IF:  Any of the following symptoms occur over the next 12 hours:  Shaking chills.  You have a temperature by mouth above 102 F (38.9 C), not controlled by medicine.  Chest, back, or muscle pain.  People around you feel you are not acting correctly or are confused.  Shortness of breath or difficulty breathing.  Dizziness and fainting.  You get a rash or develop hives.  You have a decrease in urine output.  Your urine turns a dark color or changes to pink, red, or brown. Any of the following symptoms occur over the next 10 days:  You have a temperature by mouth above 102 F (38.9 C), not controlled by medicine.  Shortness of breath.  Weakness after normal activity.  The white part of the eye turns yellow (jaundice).  You have a decrease in the amount of urine or are urinating less often.  Your urine turns a dark color or changes to pink, red, or brown. Document Released: 03/02/2000 Document Revised: 05/28/2011 Document Reviewed: 10/20/2007 [facility] Patient Information 2014 [place], [place].  _______________________________________________________________________

## 2018-11-21 ENCOUNTER — Other Ambulatory Visit: Payer: Self-pay | Admitting: Gynecologic Oncology

## 2018-11-21 ENCOUNTER — Ambulatory Visit (HOSPITAL_COMMUNITY)
Admission: RE | Admit: 2018-11-21 | Discharge: 2018-11-21 | Disposition: A | Discharge status: Home / Self Care | Payer: Medicare HMO | Source: Ambulatory Visit | Attending: Gynecologic Oncology | Admitting: Gynecologic Oncology

## 2018-11-21 ENCOUNTER — Other Ambulatory Visit: Payer: Self-pay

## 2018-11-21 ENCOUNTER — Telehealth: Payer: Self-pay

## 2018-11-21 ENCOUNTER — Encounter (HOSPITAL_COMMUNITY)
Admission: RE | Admit: 2018-11-21 | Discharge: 2018-11-21 | Disposition: A | Discharge status: Home / Self Care | Payer: Medicare HMO | Source: Ambulatory Visit | Attending: Gynecologic Oncology | Admitting: Gynecologic Oncology

## 2018-11-21 ENCOUNTER — Encounter (HOSPITAL_COMMUNITY): Payer: Self-pay

## 2018-11-21 ENCOUNTER — Other Ambulatory Visit (HOSPITAL_COMMUNITY)
Admission: RE | Admit: 2018-11-21 | Discharge: 2018-11-21 | Disposition: A | Discharge status: Home / Self Care | Payer: Medicare HMO | Source: Ambulatory Visit | Attending: Gynecologic Oncology | Admitting: Gynecologic Oncology

## 2018-11-21 DIAGNOSIS — Z90710 Acquired absence of both cervix and uterus: Secondary | ICD-10-CM | POA: Diagnosis not present

## 2018-11-21 DIAGNOSIS — Z01818 Encounter for other preprocedural examination: Secondary | ICD-10-CM | POA: Diagnosis present

## 2018-11-21 DIAGNOSIS — Z950 Presence of cardiac pacemaker: Secondary | ICD-10-CM | POA: Diagnosis not present

## 2018-11-21 DIAGNOSIS — Z79899 Other long term (current) drug therapy: Secondary | ICD-10-CM | POA: Diagnosis not present

## 2018-11-21 DIAGNOSIS — D72829 Elevated white blood cell count, unspecified: Secondary | ICD-10-CM | POA: Diagnosis not present

## 2018-11-21 DIAGNOSIS — Z20828 Contact with and (suspected) exposure to other viral communicable diseases: Secondary | ICD-10-CM | POA: Insufficient documentation

## 2018-11-21 DIAGNOSIS — F319 Bipolar disorder, unspecified: Secondary | ICD-10-CM | POA: Diagnosis not present

## 2018-11-21 DIAGNOSIS — N3000 Acute cystitis without hematuria: Secondary | ICD-10-CM

## 2018-11-21 DIAGNOSIS — Z8249 Family history of ischemic heart disease and other diseases of the circulatory system: Secondary | ICD-10-CM | POA: Diagnosis not present

## 2018-11-21 DIAGNOSIS — D271 Benign neoplasm of left ovary: Secondary | ICD-10-CM | POA: Diagnosis not present

## 2018-11-21 DIAGNOSIS — N839 Noninflammatory disorder of ovary, fallopian tube and broad ligament, unspecified: Secondary | ICD-10-CM | POA: Insufficient documentation

## 2018-11-21 DIAGNOSIS — N83201 Unspecified ovarian cyst, right side: Secondary | ICD-10-CM | POA: Diagnosis not present

## 2018-11-21 DIAGNOSIS — Z8541 Personal history of malignant neoplasm of cervix uteri: Secondary | ICD-10-CM | POA: Insufficient documentation

## 2018-11-21 DIAGNOSIS — Z888 Allergy status to other drugs, medicaments and biological substances status: Secondary | ICD-10-CM | POA: Diagnosis not present

## 2018-11-21 DIAGNOSIS — Z880 Allergy status to penicillin: Secondary | ICD-10-CM | POA: Diagnosis not present

## 2018-11-21 DIAGNOSIS — F17210 Nicotine dependence, cigarettes, uncomplicated: Secondary | ICD-10-CM | POA: Diagnosis not present

## 2018-11-21 DIAGNOSIS — Z981 Arthrodesis status: Secondary | ICD-10-CM | POA: Insufficient documentation

## 2018-11-21 DIAGNOSIS — N135 Crossing vessel and stricture of ureter without hydronephrosis: Secondary | ICD-10-CM | POA: Diagnosis not present

## 2018-11-21 DIAGNOSIS — R69 Illness, unspecified: Secondary | ICD-10-CM | POA: Diagnosis not present

## 2018-11-21 DIAGNOSIS — N736 Female pelvic peritoneal adhesions (postinfective): Secondary | ICD-10-CM | POA: Diagnosis not present

## 2018-11-21 DIAGNOSIS — R978 Other abnormal tumor markers: Secondary | ICD-10-CM | POA: Diagnosis not present

## 2018-11-21 DIAGNOSIS — F419 Anxiety disorder, unspecified: Secondary | ICD-10-CM | POA: Diagnosis not present

## 2018-11-21 LAB — COMPREHENSIVE METABOLIC PANEL
ALT: 22 U/L (ref 0–44)
AST: 20 U/L (ref 15–41)
Albumin: 4.4 g/dL (ref 3.5–5.0)
Alkaline Phosphatase: 70 U/L (ref 38–126)
Anion gap: 10 (ref 5–15)
BUN: 10 mg/dL (ref 8–23)
CO2: 24 mmol/L (ref 22–32)
Calcium: 9.3 mg/dL (ref 8.9–10.3)
Chloride: 107 mmol/L (ref 98–111)
Creatinine, Ser: 0.84 mg/dL (ref 0.44–1.00)
GFR calc Af Amer: >60 mL/min (ref >60)
GFR calc non Af Amer: >60 mL/min (ref >60)
Glucose, Bld: 122 mg/dL — ABNORMAL HIGH (ref 70–99)
Potassium: 3.7 mmol/L (ref 3.5–5.1)
Sodium: 141 mmol/L (ref 135–145)
Total Bilirubin: 0.6 mg/dL (ref 0.3–1.2)
Total Protein: 7.7 g/dL (ref 6.5–8.1)

## 2018-11-21 LAB — URINALYSIS, ROUTINE W REFLEX MICROSCOPIC
Bilirubin Urine: NEGATIVE
Glucose, UA: NEGATIVE mg/dL
Ketones, ur: NEGATIVE mg/dL
Nitrite: POSITIVE — AB
Protein, ur: NEGATIVE mg/dL
Specific Gravity, Urine: 1.004 — ABNORMAL LOW (ref 1.005–1.030)
pH: 6 (ref 5.0–8.0)

## 2018-11-21 LAB — CBC
HCT: 46.7 % — ABNORMAL HIGH (ref 36.0–46.0)
Hemoglobin: 14.7 g/dL (ref 12.0–15.0)
MCH: 29.1 pg (ref 26.0–34.0)
MCHC: 31.5 g/dL (ref 30.0–36.0)
MCV: 92.3 fL (ref 80.0–100.0)
Platelets: 354 10*3/uL (ref 150–400)
RBC: 5.06 MIL/uL (ref 3.87–5.11)
RDW: 14.9 % (ref 11.5–15.5)
WBC: 21.7 10*3/uL — ABNORMAL HIGH (ref 4.0–10.5)
nRBC: 0 % (ref 0.0–0.2)

## 2018-11-21 LAB — ABO/RH: ABO/RH(D): A POS

## 2018-11-21 MED ORDER — CIPROFLOXACIN HCL 500 MG PO TABS: 500.0000 mg | ORAL_TABLET | Freq: Two times a day (BID) | ORAL | 0 refills | Status: DC

## 2018-11-21 NOTE — Telephone Encounter (Signed)
I spoke with [name] this afternoon. I told her the urinalysis shows a possible UTI.  Her WBC was also elevated. She states that she had a steroid injection into back yesterday. She denies dysuria,fever,cough and shortness of breath.  I told her a prescription for Cipro was sent to her pharmacy and she should begin that today.  She asked if she still needed the CXR and I told her yes. She verbalized understanding.

## 2018-11-21 NOTE — Progress Notes (Signed)
Given CBC and urinalysis results, plan to treat with Cipro for UTI per Dr. Denman George.  Also plan for chest xray today to rule out pneumonia prior to surgery.

## 2018-11-21 NOTE — Telephone Encounter (Signed)
I spoke to [name] and told her that her CXR was good. She verbalized understanding.

## 2018-11-21 NOTE — Progress Notes (Signed)
Anesthesia Chart Review   Case: [id number] Date/Time: 11/25/18 0700   Procedure: XI ROBOTIC ASSISTED BILATERAL SALPINGO OOPHORECTOMY WITH POSSIBLE STAGING (Bilateral )   Anesthesia type: General   Pre-op diagnosis: LEFT OVARIAN MASS   Location: [place] [facility] / [facility]   Surgeon: Everitt Amber, MD      DISCUSSION:63 y.o. current every day smoker (10 pack years) with h/o PONV, vertigo, bipolar disorder, anxiety, depression, pacemaker for syncope and sinus pause in 2014, left ovarian mass scheduled for above procedure 11/25/2018 with Dr. Everitt Amber.    Last seen by cardiologist, Dr. Sanda Klein, 09/15/2018.  CT coronary ordered at this visit due to atypical chest pain.  CT with no significant coronary disease. 1 year follow up recommended.   Forwarded UA and CBC results to [employer], NP.  VS: BP (!) 142/96   Pulse (!) 110   Temp 36.4 C (Oral)   Resp 18   Ht 5\' 7"  (1.702 m)   Wt 61.3 kg   SpO2 98%   BMI 21.18 kg/m   PROVIDERS: [place], [facility]  [place], Dani Gobble, MD is Cardiologist  LABS: Labs reviewed: Acceptable for surgery. and UA reviewed by Joylene John, NP (all labs ordered are listed, but only abnormal results are displayed)  Labs Reviewed  CBC - Abnormal; Notable for the following components:      Result Value   WBC 21.7 (*)    HCT 46.7 (*)    All other components within normal limits  COMPREHENSIVE METABOLIC PANEL - Abnormal; Notable for the following components:   Glucose, Bld 122 (*)    All other components within normal limits  URINALYSIS, ROUTINE W REFLEX MICROSCOPIC - Abnormal; Notable for the following components:   APPearance HAZY (*)    Specific Gravity, Urine 1.004 (*)    Hgb urine dipstick SMALL (*)    Nitrite POSITIVE (*)    Leukocytes,Ua LARGE (*)    Bacteria, UA FEW (*)    All other components within normal limits  TYPE AND SCREEN     IMAGES: CT Coronary 09/29/2018 IMPRESSION: 1. Coronary artery calcium score 0 Agatston units.  This suggests low risk for future cardiac events.  2.  No significant coronary disease was noted.  EKG: 09/15/2018 Rate 80 bpm Normal sinus rhythm   CV: Echo 07/09/2017 Study Conclusions  - Left ventricle: The cavity size was normal. Wall thickness was   increased in a pattern of mild LVH. Systolic function was normal.   The estimated ejection fraction was in the range of 55% to 60%.   Wall motion was normal; there were no regional wall motion   abnormalities. Doppler parameters are consistent with abnormal   left ventricular relaxation (grade 1 diastolic dysfunction). - Right ventricle: Pacer wire or catheter noted in right ventricle. - Right atrium: Pacer wire or catheter noted in right atrium. - Tricuspid valve: There was mild regurgitation. Past Medical History:  Diagnosis Date  . Anxiety    anxiety and mild depressive order systoms  . Bipolar disorder ([place])     (02/20/2013)  . Cervical cancer ([place]) 03/19/1980  . Depression   . Dysrhythmia   . [id number])    "monthly" (02/20/2013)  . Pacemaker    medtronic  . PONV (postoperative nausea and vomiting)   . Sinus pause 02/14/2013  . Syncope and collapse    echo-April 12,2012-EF 55% Echo normal; recorder with prolonged sinus pauses --> status post  . Tobacco abuse   . Vertigo  Past Surgical History:  Procedure Laterality Date  . ABDOMINAL HYSTERECTOMY  03/19/1980   "partial"  . BACK SURGERY    . CERVICAL FUSION Left 11/2013   C4-C6  . CERVICAL FUSION     3,4,5,  . INSERT / REPLACE / REMOVE PACEMAKER  02/20/2013   medtronic  . LOOP RECORDER IMPLANT N/A 11/06/2012   Procedure: LINQ LOOP RECORDER IMPLANT;  Surgeon: Sanda Klein, MD;  Location: [place] CATH LAB;  Service: Cardiovascular;  Laterality: N/A;  . NECK SURGERY  11/17/2013  . PACEMAKER PLACEMENT N/A 02/16/2013  . PERMANENT PACEMAKER INSERTION N/A 02/20/2013   Procedure: PERMANENT PACEMAKER INSERTION;  Surgeon: Sanda Klein, MD;  Location: [place] CATH LAB;   Service: Cardiovascular;  Laterality: N/A;  . POSTERIOR LUMBAR FUSION  ~ 2009  . SPINAL FUSION  03/19/2014  . TONSILLECTOMY    . TRANSTHORACIC ECHOCARDIOGRAM  07/11/2010   The left atrial size is normal.There is no evidence of mitral vavle prolaspe.Right ventriicular systolic pressure is normal . Injection of contrast documented no interatrial shunt. Essentially normal 2D echo -doppler study     MEDICATIONS: . ALPRAZolam (XANAX) 1 MG tablet  . busPIRone (BUSPAR) 5 MG tablet  . cyclobenzaprine (FLEXERIL) 10 MG tablet  . hydrOXYzine (VISTARIL) 25 MG capsule  . ibuprofen (ADVIL) 600 MG tablet  . Melatonin 5 MG CAPS  . mirtazapine (REMERON) 45 MG tablet  . omeprazole (PRILOSEC) 40 MG capsule  . oxyCODONE (OXY IR/ROXICODONE) 5 MG immediate release tablet  . risperiDONE (RISPERDAL) 2 MG tablet  . rosuvastatin (CRESTOR) 20 MG tablet  . senna-docusate (SENOKOT-S) 8.6-50 MG tablet  . sertraline (ZOLOFT) 100 MG tablet   No current facility-administered medications for this encounter.     Maia Plan [facility] Pre-Surgical Testing [phone] 11/21/18  12:05 PM

## 2018-11-21 NOTE — Progress Notes (Signed)
11-21-18 CBC and UA results routed to Dr. Denman George for review.

## 2018-11-22 LAB — NOVEL CORONAVIRUS, NAA (HOSP ORDER, SEND-OUT TO REF LAB; TAT 18-24 HRS): SARS-CoV-2, NAA: NOT DETECTED

## 2018-11-23 LAB — URINE CULTURE: Culture: >=100000 — AB

## 2018-11-24 ENCOUNTER — Encounter: Payer: Self-pay | Admitting: Gynecologic Oncology

## 2018-11-24 ENCOUNTER — Other Ambulatory Visit: Payer: Self-pay | Admitting: Gynecologic Oncology

## 2018-11-24 DIAGNOSIS — N3000 Acute cystitis without hematuria: Secondary | ICD-10-CM

## 2018-11-24 MED ORDER — SULFAMETHOXAZOLE-TRIMETHOPRIM 800-160 MG PO TABS: 1.0000 | ORAL_TABLET | Freq: Two times a day (BID) | ORAL | 0 refills | Status: DC

## 2018-11-24 NOTE — Progress Notes (Signed)
New abx sent in due to results of urine culture. Spoke with patient and she verbalizes understanding to stop taking Cipro and begin taking Bactrim.

## 2018-11-24 NOTE — Anesthesia Preprocedure Evaluation (Addendum)
Anesthesia Evaluation  Patient identified by MRN, date of birth, ID band Patient awake    Reviewed: Allergy & Precautions, NPO status , Patient's Chart, lab work & pertinent test results  History of Anesthesia Complications (+) PONV  Airway Mallampati: I  TM Distance: >3 FB Neck ROM: Full    Dental no notable dental hx. (+) Upper Dentures, Lower Dentures   Pulmonary Current Smoker,    Pulmonary exam normal breath sounds clear to auscultation       Cardiovascular Exercise Tolerance: Good negative cardio ROS Normal cardiovascular exam+ pacemaker  Rhythm:Regular Rate:Normal  07/09/17 Echo  Left ventricle: The cavity size was normal. Wall thickness was   increased in a pattern of mild LVH. Systolic function was normal.   The estimated ejection fraction was in the range of 55% to 60%.   Wall motion was normal; there were no regional wall motion   abnormalities. Doppler parameters are consistent with abnormal   left ventricular relaxation (grade 1 diastolic dysfunction).  2014 Pacemker  For syncope and Sinus pauses   Neuro/Psych Anxiety Depression Bipolar Disorder negative neurological ROS     GI/Hepatic negative GI ROS, Neg liver ROS,   Endo/Other  negative endocrine ROS  Renal/GU negative Renal ROSUTI via labs 9/4   Cervical CA hx     Musculoskeletal negative musculoskeletal ROS (+)   Abdominal   Peds  Hematology negative hematology ROS (+)   Anesthesia Other Findings All PCN Prednisone  Reproductive/Obstetrics                            Lab Results  Component Value Date   CREATININE 0.84 11/21/2018   BUN 10 11/21/2018   NA 141 11/21/2018   K 3.7 11/21/2018   CL 107 11/21/2018   CO2 24 11/21/2018   Lab Results  Component Value Date   WBC 21.7 (H) 11/21/2018   HGB 14.7 11/21/2018   HCT 46.7 (H) 11/21/2018   MCV 92.3 11/21/2018   PLT 354 11/21/2018     Anesthesia  Physical Anesthesia Plan  ASA: III  Anesthesia Plan: General   Post-op Pain Management:    Induction: Intravenous  PONV Risk Score and Plan: 3 and Treatment may vary due to age or medical condition, Midazolam, Scopolamine patch - Pre-op and Ondansetron  Airway Management Planned: Oral ETT  Additional Equipment: None  Intra-op Plan:   Post-operative Plan: Extubation in OR  Informed Consent: I have reviewed the patients History and Physical, chart, labs and discussed the procedure including the risks, benefits and alternatives for the proposed anesthesia with the patient or authorized representative who has indicated his/her understanding and acceptance.     Dental advisory given  Plan Discussed with:   Anesthesia Plan Comments: (GA w ERAS (lidocaine infusion +/- 0.4 mcg/kg dexmedetomidine     GA + Eras w (lidocaine infusion, +/- 0.4 mcg/kg dexmedetomidine)  Check tx of suspected Uti        )       Anesthesia Quick Evaluation

## 2018-11-25 ENCOUNTER — Ambulatory Visit (HOSPITAL_COMMUNITY): Payer: Medicare HMO | Admitting: Physician Assistant

## 2018-11-25 ENCOUNTER — Other Ambulatory Visit: Payer: Self-pay

## 2018-11-25 ENCOUNTER — Encounter (HOSPITAL_COMMUNITY)
Admission: RE | Disposition: A | Discharge status: Home / Self Care | Payer: Self-pay | Source: Home / Self Care | Attending: Gynecologic Oncology

## 2018-11-25 ENCOUNTER — Encounter (HOSPITAL_COMMUNITY): Payer: Self-pay

## 2018-11-25 ENCOUNTER — Ambulatory Visit (HOSPITAL_COMMUNITY)
Admission: RE | Admit: 2018-11-25 | Discharge: 2018-11-25 | Disposition: A | Discharge status: Home / Self Care | Payer: Medicare HMO | Attending: Gynecologic Oncology | Admitting: Gynecologic Oncology

## 2018-11-25 ENCOUNTER — Ambulatory Visit (HOSPITAL_COMMUNITY): Payer: Medicare HMO | Admitting: Anesthesiology

## 2018-11-25 DIAGNOSIS — Z950 Presence of cardiac pacemaker: Secondary | ICD-10-CM | POA: Insufficient documentation

## 2018-11-25 DIAGNOSIS — N83201 Unspecified ovarian cyst, right side: Secondary | ICD-10-CM | POA: Diagnosis not present

## 2018-11-25 DIAGNOSIS — N83202 Unspecified ovarian cyst, left side: Secondary | ICD-10-CM | POA: Diagnosis not present

## 2018-11-25 DIAGNOSIS — Z90710 Acquired absence of both cervix and uterus: Secondary | ICD-10-CM | POA: Insufficient documentation

## 2018-11-25 DIAGNOSIS — Z981 Arthrodesis status: Secondary | ICD-10-CM | POA: Insufficient documentation

## 2018-11-25 DIAGNOSIS — D271 Benign neoplasm of left ovary: Secondary | ICD-10-CM

## 2018-11-25 DIAGNOSIS — N839 Noninflammatory disorder of ovary, fallopian tube and broad ligament, unspecified: Secondary | ICD-10-CM | POA: Diagnosis not present

## 2018-11-25 DIAGNOSIS — N83292 Other ovarian cyst, left side: Secondary | ICD-10-CM | POA: Diagnosis not present

## 2018-11-25 DIAGNOSIS — Z8249 Family history of ischemic heart disease and other diseases of the circulatory system: Secondary | ICD-10-CM | POA: Insufficient documentation

## 2018-11-25 DIAGNOSIS — N135 Crossing vessel and stricture of ureter without hydronephrosis: Secondary | ICD-10-CM | POA: Diagnosis not present

## 2018-11-25 DIAGNOSIS — N736 Female pelvic peritoneal adhesions (postinfective): Secondary | ICD-10-CM

## 2018-11-25 DIAGNOSIS — R978 Other abnormal tumor markers: Secondary | ICD-10-CM

## 2018-11-25 DIAGNOSIS — R69 Illness, unspecified: Secondary | ICD-10-CM | POA: Diagnosis not present

## 2018-11-25 DIAGNOSIS — Z888 Allergy status to other drugs, medicaments and biological substances status: Secondary | ICD-10-CM | POA: Insufficient documentation

## 2018-11-25 DIAGNOSIS — Z8541 Personal history of malignant neoplasm of cervix uteri: Secondary | ICD-10-CM | POA: Insufficient documentation

## 2018-11-25 DIAGNOSIS — D200 Benign neoplasm of soft tissue of retroperitoneum: Secondary | ICD-10-CM | POA: Diagnosis not present

## 2018-11-25 DIAGNOSIS — F419 Anxiety disorder, unspecified: Secondary | ICD-10-CM | POA: Insufficient documentation

## 2018-11-25 DIAGNOSIS — F17210 Nicotine dependence, cigarettes, uncomplicated: Secondary | ICD-10-CM | POA: Insufficient documentation

## 2018-11-25 DIAGNOSIS — Z79899 Other long term (current) drug therapy: Secondary | ICD-10-CM | POA: Insufficient documentation

## 2018-11-25 DIAGNOSIS — F319 Bipolar disorder, unspecified: Secondary | ICD-10-CM | POA: Insufficient documentation

## 2018-11-25 DIAGNOSIS — Z880 Allergy status to penicillin: Secondary | ICD-10-CM | POA: Insufficient documentation

## 2018-11-25 HISTORY — PX: ROBOTIC ASSISTED SALPINGO OOPHERECTOMY: SHX6082

## 2018-11-25 LAB — TYPE AND SCREEN
ABO/RH(D): A POS
Antibody Screen: NEGATIVE

## 2018-11-25 SURGERY — SALPINGO-OOPHORECTOMY, ROBOT-ASSISTED
Anesthesia: General | Laterality: Bilateral

## 2018-11-25 MED ORDER — LACTATED RINGERS IV SOLN
INTRAVENOUS | Status: DC
  Administered 2018-11-25: 07:00:00 via INTRAVENOUS

## 2018-11-25 MED ORDER — DEXAMETHASONE SODIUM PHOSPHATE 10 MG/ML IJ SOLN
INTRAMUSCULAR | Status: AC
  Filled 2018-11-25: qty 1

## 2018-11-25 MED ORDER — LIDOCAINE 2% (20 MG/ML) 5 ML SYRINGE
INTRAMUSCULAR | Status: AC
  Filled 2018-11-25: qty 10

## 2018-11-25 MED ORDER — CEFAZOLIN SODIUM-DEXTROSE 2-4 GM/100ML-% IV SOLN
INTRAVENOUS | Status: AC
  Filled 2018-11-25: qty 100

## 2018-11-25 MED ORDER — KETOROLAC TROMETHAMINE 30 MG/ML IJ SOLN: 30.0000 mg | Freq: Once | INTRAMUSCULAR | Status: DC | PRN

## 2018-11-25 MED ORDER — SODIUM CHLORIDE 0.9% FLUSH: 3.0000 mL | Freq: Two times a day (BID) | INTRAVENOUS | Status: DC

## 2018-11-25 MED ORDER — ONDANSETRON HCL 4 MG/2ML IJ SOLN
INTRAMUSCULAR | Status: DC | PRN
  Administered 2018-11-25: 4 mg via INTRAVENOUS

## 2018-11-25 MED ORDER — LACTATED RINGERS IV SOLN: INTRAVENOUS | Status: DC

## 2018-11-25 MED ORDER — KETOROLAC TROMETHAMINE 30 MG/ML IJ SOLN: 30.0000 mg | Freq: Four times a day (QID) | INTRAMUSCULAR | Status: DC

## 2018-11-25 MED ORDER — SODIUM CHLORIDE 0.9% FLUSH: 3.0000 mL | INTRAVENOUS | Status: DC | PRN

## 2018-11-25 MED ORDER — GABAPENTIN 300 MG PO CAPS
300.0000 mg | ORAL_CAPSULE | ORAL | Status: AC
  Administered 2018-11-25: 300 mg via ORAL
  Filled 2018-11-25: qty 1

## 2018-11-25 MED ORDER — MORPHINE SULFATE (PF) 4 MG/ML IV SOLN: 2.0000 mg | INTRAVENOUS | Status: DC | PRN

## 2018-11-25 MED ORDER — LIDOCAINE 2% (20 MG/ML) 5 ML SYRINGE
INTRAMUSCULAR | Status: DC | PRN
  Administered 2018-11-25: 1.5 mg/kg/h via INTRAVENOUS

## 2018-11-25 MED ORDER — MIDAZOLAM HCL 2 MG/2ML IJ SOLN
INTRAMUSCULAR | Status: AC
  Filled 2018-11-25: qty 2

## 2018-11-25 MED ORDER — DEXAMETHASONE SODIUM PHOSPHATE 10 MG/ML IJ SOLN
INTRAMUSCULAR | Status: DC | PRN
  Administered 2018-11-25: 8 mg via INTRAVENOUS

## 2018-11-25 MED ORDER — CELECOXIB 200 MG PO CAPS
400.0000 mg | ORAL_CAPSULE | ORAL | Status: AC
  Administered 2018-11-25: 400 mg via ORAL
  Filled 2018-11-25: qty 2

## 2018-11-25 MED ORDER — PROPOFOL 10 MG/ML IV BOLUS
INTRAVENOUS | Status: DC | PRN
  Administered 2018-11-25: 110 mg via INTRAVENOUS

## 2018-11-25 MED ORDER — BUPIVACAINE HCL 0.25 % IJ SOLN
INTRAMUSCULAR | Status: DC | PRN
  Administered 2018-11-25: 16 mL

## 2018-11-25 MED ORDER — PROPOFOL 10 MG/ML IV BOLUS
INTRAVENOUS | Status: AC
  Filled 2018-11-25: qty 20

## 2018-11-25 MED ORDER — STERILE WATER FOR INJECTION IJ SOLN
INTRAMUSCULAR | Status: AC
  Filled 2018-11-25: qty 10

## 2018-11-25 MED ORDER — DEXMEDETOMIDINE HCL IN NACL 200 MCG/50ML IV SOLN
INTRAVENOUS | Status: AC
  Filled 2018-11-25: qty 50

## 2018-11-25 MED ORDER — LIDOCAINE 2% (20 MG/ML) 5 ML SYRINGE
INTRAMUSCULAR | Status: DC | PRN
  Administered 2018-11-25: 80 mg via INTRAVENOUS

## 2018-11-25 MED ORDER — LIDOCAINE 2% (20 MG/ML) 5 ML SYRINGE
INTRAMUSCULAR | Status: AC
  Filled 2018-11-25: qty 5

## 2018-11-25 MED ORDER — FENTANYL CITRATE (PF) 100 MCG/2ML IJ SOLN
INTRAMUSCULAR | Status: AC
  Filled 2018-11-25: qty 2

## 2018-11-25 MED ORDER — ONDANSETRON HCL 4 MG/2ML IJ SOLN
INTRAMUSCULAR | Status: AC
  Filled 2018-11-25: qty 2

## 2018-11-25 MED ORDER — OXYCODONE HCL 5 MG/5ML PO SOLN: 5.0000 mg | Freq: Once | ORAL | Status: DC | PRN

## 2018-11-25 MED ORDER — ROCURONIUM BROMIDE 10 MG/ML (PF) SYRINGE
PREFILLED_SYRINGE | INTRAVENOUS | Status: AC
  Filled 2018-11-25: qty 10

## 2018-11-25 MED ORDER — CEFAZOLIN SODIUM-DEXTROSE 2-3 GM-%(50ML) IV SOLR
INTRAVENOUS | Status: DC | PRN
  Administered 2018-11-25: 2 g via INTRAVENOUS

## 2018-11-25 MED ORDER — OXYCODONE HCL 5 MG PO TABS: 5.0000 mg | ORAL_TABLET | Freq: Once | ORAL | Status: DC | PRN

## 2018-11-25 MED ORDER — ACETAMINOPHEN 500 MG PO TABS
1000.0000 mg | ORAL_TABLET | ORAL | Status: AC
  Administered 2018-11-25: 1000 mg via ORAL
  Filled 2018-11-25: qty 2

## 2018-11-25 MED ORDER — ACETAMINOPHEN 325 MG PO TABS: 650.0000 mg | ORAL_TABLET | ORAL | Status: DC | PRN

## 2018-11-25 MED ORDER — ONDANSETRON HCL 4 MG/2ML IJ SOLN: 4.0000 mg | Freq: Once | INTRAMUSCULAR | Status: DC | PRN

## 2018-11-25 MED ORDER — PHENYLEPHRINE 40 MCG/ML (10ML) SYRINGE FOR IV PUSH (FOR BLOOD PRESSURE SUPPORT)
PREFILLED_SYRINGE | INTRAVENOUS | Status: AC
  Filled 2018-11-25: qty 10

## 2018-11-25 MED ORDER — OXYCODONE HCL 5 MG PO TABS: 5.0000 mg | ORAL_TABLET | ORAL | Status: DC | PRN

## 2018-11-25 MED ORDER — LACTATED RINGERS IR SOLN
Status: DC | PRN
  Administered 2018-11-25: 1000 mL

## 2018-11-25 MED ORDER — SODIUM CHLORIDE 0.9 % IV SOLN: 250.0000 mL | INTRAVENOUS | Status: DC | PRN

## 2018-11-25 MED ORDER — FENTANYL CITRATE (PF) 100 MCG/2ML IJ SOLN: 25.0000 ug | INTRAMUSCULAR | Status: DC | PRN

## 2018-11-25 MED ORDER — DEXMEDETOMIDINE HCL IN NACL 200 MCG/50ML IV SOLN
INTRAVENOUS | Status: DC | PRN
  Administered 2018-11-25 (×3): 8 ug via INTRAVENOUS

## 2018-11-25 MED ORDER — FENTANYL CITRATE (PF) 100 MCG/2ML IJ SOLN
INTRAMUSCULAR | Status: DC | PRN
  Administered 2018-11-25: 75 ug via INTRAVENOUS
  Administered 2018-11-25: 25 ug via INTRAVENOUS

## 2018-11-25 MED ORDER — PHENYLEPHRINE 40 MCG/ML (10ML) SYRINGE FOR IV PUSH (FOR BLOOD PRESSURE SUPPORT)
PREFILLED_SYRINGE | INTRAVENOUS | Status: DC | PRN
  Administered 2018-11-25: 80 ug via INTRAVENOUS

## 2018-11-25 MED ORDER — STERILE WATER FOR IRRIGATION IR SOLN
Status: DC | PRN
  Administered 2018-11-25: 1000 mL

## 2018-11-25 MED ORDER — BUPIVACAINE HCL (PF) 0.25 % IJ SOLN
INTRAMUSCULAR | Status: AC
  Filled 2018-11-25: qty 30

## 2018-11-25 MED ORDER — SCOPOLAMINE 1 MG/3DAYS TD PT72
1.0000 | MEDICATED_PATCH | TRANSDERMAL | Status: DC
  Administered 2018-11-25: 1.5 mg via TRANSDERMAL
  Filled 2018-11-25: qty 1

## 2018-11-25 MED ORDER — ROCURONIUM BROMIDE 10 MG/ML (PF) SYRINGE
PREFILLED_SYRINGE | INTRAVENOUS | Status: DC | PRN
  Administered 2018-11-25: 40 mg via INTRAVENOUS

## 2018-11-25 MED ORDER — SUGAMMADEX SODIUM 200 MG/2ML IV SOLN
INTRAVENOUS | Status: DC | PRN
  Administered 2018-11-25: 130 mg via INTRAVENOUS

## 2018-11-25 MED ORDER — ACETAMINOPHEN 650 MG RE SUPP
650.0000 mg | RECTAL | Status: DC | PRN
  Filled 2018-11-25: qty 1

## 2018-11-25 MED ORDER — MIDAZOLAM HCL 5 MG/5ML IJ SOLN
INTRAMUSCULAR | Status: DC | PRN
  Administered 2018-11-25: 2 mg via INTRAVENOUS

## 2018-11-25 MED ORDER — ENOXAPARIN SODIUM 40 MG/0.4ML ~~LOC~~ SOLN
40.0000 mg | SUBCUTANEOUS | Status: AC
  Administered 2018-11-25: 40 mg via SUBCUTANEOUS
  Filled 2018-11-25: qty 0.4

## 2018-11-25 MED ORDER — DEXAMETHASONE SODIUM PHOSPHATE 4 MG/ML IJ SOLN: 4.0000 mg | INTRAMUSCULAR | Status: DC

## 2018-11-25 SURGICAL SUPPLY — 61 items
APPLICATOR SURGIFLO ENDO (HEMOSTASIS) IMPLANT
BAG LAPAROSCOPIC 12 15 PORT 16 (BASKET) IMPLANT
BAG RETRIEVAL 12/15 (BASKET)
BLADE SURG SZ10 CARB STEEL (BLADE) IMPLANT
COVER BACK TABLE 60X90IN (DRAPES) ×2 IMPLANT
COVER TIP SHEARS 8 DVNC (MISCELLANEOUS) ×1 IMPLANT
COVER TIP SHEARS 8MM DA VINCI (MISCELLANEOUS) ×1
COVER WAND RF STERILE (DRAPES) IMPLANT
DECANTER SPIKE VIAL GLASS SM (MISCELLANEOUS) ×1 IMPLANT
DERMABOND ADVANCED (GAUZE/BANDAGES/DRESSINGS) ×1
DERMABOND ADVANCED .7 DNX12 (GAUZE/BANDAGES/DRESSINGS) ×1 IMPLANT
DRAPE ARM DVNC X/XI (DISPOSABLE) ×4 IMPLANT
DRAPE COLUMN DVNC XI (DISPOSABLE) ×1 IMPLANT
DRAPE DA VINCI XI ARM (DISPOSABLE) ×4
DRAPE DA VINCI XI COLUMN (DISPOSABLE) ×1
DRAPE SHEET LG 3/4 BI-LAMINATE (DRAPES) ×2 IMPLANT
DRAPE SURG IRRIG POUCH 19X23 (DRAPES) ×2 IMPLANT
DRSG OPSITE POSTOP 4X6 (GAUZE/BANDAGES/DRESSINGS) IMPLANT
DRSG OPSITE POSTOP 4X8 (GAUZE/BANDAGES/DRESSINGS) IMPLANT
ELECT REM PT RETURN 15FT ADLT (MISCELLANEOUS) ×2 IMPLANT
GLOVE BIO SURGEON STRL SZ 6 (GLOVE) ×8 IMPLANT
GLOVE BIO SURGEON STRL SZ 6.5 (GLOVE) IMPLANT
GOWN STRL REUS W/ TWL LRG LVL3 (GOWN DISPOSABLE) ×4 IMPLANT
GOWN STRL REUS W/TWL LRG LVL3 (GOWN DISPOSABLE) ×4
HOLDER FOLEY CATH W/STRAP (MISCELLANEOUS) ×1 IMPLANT
IRRIG SUCT STRYKERFLOW 2 WTIP (MISCELLANEOUS) ×2
IRRIGATION SUCT STRKRFLW 2 WTP (MISCELLANEOUS) ×1 IMPLANT
KIT PROCEDURE DA VINCI SI (MISCELLANEOUS)
KIT PROCEDURE DVNC SI (MISCELLANEOUS) IMPLANT
KIT TURNOVER KIT A (KITS) ×1 IMPLANT
MANIPULATOR UTERINE 4.5 ZUMI (MISCELLANEOUS) ×1 IMPLANT
NDL SPNL 18GX3.5 QUINCKE PK (NEEDLE) IMPLANT
NEEDLE HYPO 22GX1.5 SAFETY (NEEDLE) ×1 IMPLANT
NEEDLE SPNL 18GX3.5 QUINCKE PK (NEEDLE) IMPLANT
OBTURATOR OPTICAL STANDARD 8MM (TROCAR) ×1
OBTURATOR OPTICAL STND 8 DVNC (TROCAR) ×1
OBTURATOR OPTICALSTD 8 DVNC (TROCAR) ×1 IMPLANT
PACK ROBOT GYN CUSTOM WL (TRAY / TRAY PROCEDURE) ×2 IMPLANT
PAD POSITIONING PINK XL (MISCELLANEOUS) ×2 IMPLANT
PORT ACCESS TROCAR AIRSEAL 12 (TROCAR) ×1 IMPLANT
PORT ACCESS TROCAR AIRSEAL 5M (TROCAR) ×1
POUCH SPECIMEN RETRIEVAL 10MM (ENDOMECHANICALS) ×2 IMPLANT
SEAL CANN UNIV 5-8 DVNC XI (MISCELLANEOUS) ×3 IMPLANT
SEAL XI 5MM-8MM UNIVERSAL (MISCELLANEOUS) ×3
SET TRI-LUMEN FLTR TB AIRSEAL (TUBING) ×2 IMPLANT
SPONGE LAP 18X18 X RAY DECT (DISPOSABLE) IMPLANT
SURGIFLO W/THROMBIN 8M KIT (HEMOSTASIS) IMPLANT
SUT MNCRL AB 4-0 PS2 18 (SUTURE) IMPLANT
SUT PDS AB 1 TP1 96 (SUTURE) IMPLANT
SUT VIC AB 0 CT1 27 (SUTURE)
SUT VIC AB 0 CT1 27XBRD ANTBC (SUTURE) IMPLANT
SUT VIC AB 2-0 CT1 27 (SUTURE)
SUT VIC AB 2-0 CT1 TAPERPNT 27 (SUTURE) IMPLANT
SUT VIC AB 4-0 PS2 18 (SUTURE) ×4 IMPLANT
SYR 10ML LL (SYRINGE) IMPLANT
TOWEL OR NON WOVEN STRL DISP B (DISPOSABLE) ×1 IMPLANT
TRAP SPECIMEN MUCOUS 40CC (MISCELLANEOUS) ×1 IMPLANT
TRAY FOLEY MTR SLVR 16FR STAT (SET/KITS/TRAYS/PACK) ×2 IMPLANT
UNDERPAD 30X36 HEAVY ABSORB (UNDERPADS AND DIAPERS) ×2 IMPLANT
WATER STERILE IRR 1000ML POUR (IV SOLUTION) ×2 IMPLANT
YANKAUER SUCT BULB TIP 10FT TU (MISCELLANEOUS) IMPLANT

## 2018-11-25 NOTE — Anesthesia Procedure Notes (Signed)
Procedure Name: Intubation Date/Time: 11/25/2018 7:38 AM Performed by: Victoriano Lain, CRNA Pre-anesthesia Checklist: Patient identified, Emergency Drugs available, Suction available, Patient being monitored and Timeout performed Patient Re-evaluated:Patient Re-evaluated prior to induction Oxygen Delivery Method: Circle system utilized Preoxygenation: Pre-oxygenation with 100% oxygen Induction Type: IV induction Ventilation: Mask ventilation without difficulty Laryngoscope Size: Mac and 4 Grade View: Grade I Tube type: Oral Tube size: 7.5 mm Number of attempts: 1 Placement Confirmation: ETT inserted through vocal cords under direct vision,  positive ETCO2 and breath sounds checked- equal and bilateral Secured at: 21 cm Tube secured with: Tape Dental Injury: Teeth and Oropharynx as per pre-operative assessment

## 2018-11-25 NOTE — Discharge Instructions (Addendum)
Return to work: 2 weeks (1 week with physical restrictions).  Activity: 1. Be up and out of the bed during the day.  Take a nap if needed.  You may walk up steps but be careful and use the hand rail.  Stair climbing will tire you more than you think, you may need to stop part way and rest.   2. No lifting or straining for 4 weeks.  3. No driving for 3 days to 1 week.  Do Not drive if you are taking narcotic pain medicine.  4. Shower daily.  Use soap and water on your incision and pat dry; don't rub.   5. No sexual activity and nothing in the vagina for 4 weeks.  Medications:  - Take ibuprofen and tylenol first line for pain control. Take these regularly (every 6 hours) to decrease the build up of pain.  - If necessary, for severe pain not relieved by ibuprofen, contact Dr Serita Grit office and you will be prescribed percocet.  - While taking percocet you should take sennakot every night to reduce the likelihood of constipation. If this causes diarrhea, stop its use.  Diet: 1. Low sodium Heart Healthy Diet is recommended.  2. It is safe to use a laxative if you have difficulty moving your bowels.   Wound Care: 1. Keep clean and dry.  Shower daily.  Reasons to call the Doctor:   Fever - Oral temperature greater than 100.4 degrees Fahrenheit  Foul-smelling vaginal discharge  Difficulty urinating  Nausea and vomiting  Increased pain at the site of the incision that is unrelieved with pain medicine.  Difficulty breathing with or without chest pain  New calf pain especially if only on one side  Sudden, continuing increased vaginal bleeding with or without clots.   Follow-up: 1. See Everitt Amber in 4 weeks.  Contacts: For questions or concerns you should contact:  Dr. Everitt Amber at [phone] After hours and on week-ends call [phone] and ask to speak to the physician on call for Gynecologic Oncology   Bilateral Salpingo-Oophorectomy, Care After This sheet gives  you information about how to care for yourself after your procedure. Your health care provider may also give you more specific instructions. If you have problems or questions, contact your health care provider. What can I expect after the procedure? After the procedure, it is common to have:  Abdominal pain.  Some occasional vaginal bleeding (spotting).  Tiredness.  Symptoms of menopause, such as hot flashes, night sweats, or mood swings. Follow these instructions at home: Incision care   Keep your incision area and your bandage (dressing) clean and dry.  Follow instructions from your health care provider about how to take care of your incision. Make sure you: ? Wash your hands with soap and water before you change your dressing. If soap and water are not available, use hand sanitizer. ? Change your dressing as told by your health care provider. ? Leave stitches (sutures), staples, skin glue, or adhesive strips in place. These skin closures may need to stay in place for 2 weeks or longer. If adhesive strip edges start to loosen and curl up, you may trim the loose edges. Do not remove adhesive strips completely unless your health care provider tells you to do that.  Check your incision area every day for signs of infection. Check for: ? Redness, swelling, or pain. ? Fluid or blood. ? Warmth. ? Pus or a bad smell. Activity   Do not drive or  use heavy machinery while taking prescription pain medicine.  Do not drive for 24 hours if you received a medicine to help you relax (sedative) during your procedure.  Take frequent, short walks throughout the day. Rest when you get tired. Ask your health care provider what activities are safe for you.  Avoid activity that requires great effort. Also, avoid heavy lifting. Do not lift anything that is heavier than 10 lbs. (4.5 kg), or the limit that your health care provider tells you, until he or she says that it is safe to do so.  Do not  douche, use tampons, or have sex until your health care provider approves. General instructions   To prevent or treat constipation while you are taking prescription pain medicine, your health care provider may recommend that you: ? Drink enough fluid to keep your urine clear or pale yellow. ? Take over-the-counter or prescription medicines. ? Eat foods that are high in fiber, such as fresh fruits and vegetables, whole grains, and beans. ? Limit foods that are high in fat and processed sugars, such as fried and sweet foods.  Take over-the-counter and prescription medicines only as told by your health care provider.  Do not take baths, swim, or use a hot tub until your health care provider approves. Ask your health care provider if you can take showers. You may only be allowed to take sponge baths for bathing.  Wear compression stockings as told by your health care provider. These stockings help to prevent blood clots and reduce swelling in your legs.  Keep all follow-up visits as told by your health care provider. This is important. Contact a health care provider if:  You have pain when you urinate.  You have pus or a bad smelling discharge coming from your vagina.  You have redness, swelling, or pain around your incision.  You have fluid or blood coming from your incision.  Your incision feels warm to the touch.  You have pus or a bad smell coming from your incision.  You have a fever.  Your incision starts to break open.  You have pain in the abdomen, and it gets worse or does not get better when you take medicine.  You develop a rash.  You develop nausea and vomiting.  You feel lightheaded. Get help right away if:  You develop pain in your chest or leg.  You become short of breath.  You faint.  You have increased bleeding from your vagina. Summary  After the procedure, it is common to have pain, bleeding in the vagina, and symptoms of menopause.  Follow  instructions from your health care provider about how to take care of your incision.  Follow instructions from your health care provider about activities and restrictions.  Check your incision every day for signs of infection and report any symptoms to your health care provider. This information is not intended to replace advice given to you by your health care provider. Make sure you discuss any questions you have with your health care provider. Document Released: 03/05/2005 Document Revised: 05/09/2018 Document Reviewed: 04/09/2016 Elsevier Patient Education  2020 [place] Anesthesia, Adult, Care After This sheet gives you information about how to care for yourself after your procedure. Your health care provider may also give you more specific instructions. If you have problems or questions, contact your health care provider. What can I expect after the procedure? After the procedure, the following side effects are common:  Pain or discomfort  at the IV site.  Nausea.  Vomiting.  Sore throat.  Trouble concentrating.  Feeling cold or chills.  Weak or tired.  Sleepiness and fatigue.  Soreness and body aches. These side effects can affect parts of the body that were not involved in surgery. Follow these instructions at home:  For at least 24 hours after the procedure:  Have a responsible adult stay with you. It is important to have someone help care for you until you are awake and alert.  Rest as needed.  Do not: ? Participate in activities in which you could fall or become injured. ? Drive. ? Use heavy machinery. ? Drink alcohol. ? Take sleeping pills or medicines that cause drowsiness. ? Make important decisions or sign legal documents. ? Take care of children on your own. Eating and drinking  Follow any instructions from your health care provider about eating or drinking restrictions.  When you feel hungry, start by eating small amounts of foods  that are soft and easy to digest (bland), such as toast. Gradually return to your regular diet.  Drink enough fluid to keep your urine pale yellow.  If you vomit, rehydrate by drinking water, juice, or clear broth. General instructions  If you have sleep apnea, surgery and certain medicines can increase your risk for breathing problems. Follow instructions from your health care provider about wearing your sleep device: ? Anytime you are sleeping, including during daytime naps. ? While taking prescription pain medicines, sleeping medicines, or medicines that make you drowsy.  Return to your normal activities as told by your health care provider. Ask your health care provider what activities are safe for you.  Take over-the-counter and prescription medicines only as told by your health care provider.  If you smoke, do not smoke without supervision.  Keep all follow-up visits as told by your health care provider. This is important. Contact a health care provider if:  You have nausea or vomiting that does not get better with medicine.  You cannot eat or drink without vomiting.  You have pain that does not get better with medicine.  You are unable to pass urine.  You develop a skin rash.  You have a fever.  You have redness around your IV site that gets worse. Get help right away if:  You have difficulty breathing.  You have chest pain.  You have blood in your urine or stool, or you vomit blood. Summary  After the procedure, it is common to have a sore throat or nausea. It is also common to feel tired.  Have a responsible adult stay with you for the first 24 hours after general anesthesia. It is important to have someone help care for you until you are awake and alert.  When you feel hungry, start by eating small amounts of foods that are soft and easy to digest (bland), such as toast. Gradually return to your regular diet.  Drink enough fluid to keep your urine pale  yellow.  Return to your normal activities as told by your health care provider. Ask your health care provider what activities are safe for you. This information is not intended to replace advice given to you by your health care provider. Make sure you discuss any questions you have with your health care provider. Document Released: 06/11/2000 Document Revised: 03/08/2017 Document Reviewed: 10/19/2016 Elsevier Patient Education  2020 [employer].

## 2018-11-25 NOTE — Op Note (Addendum)
OPERATIVE NOTE  Date: 11/25/18  Preoperative Diagnosis: left ovarian complex cyst, elevated tumor markers.    Postoperative Diagnosis:  Same and dense pelvic adhesions. Left retroperitoneal fibrosis.  Procedure(s) Performed: Robotic-assisted laparoscopic bilateral salpingo-oophorectomy, lysis of adhesions, left ureterolysis.   Surgeon: Everitt Amber, M.D.  Assistant Surgeon: Lahoma Crocker M.D. (an MD assistant was necessary for tissue manipulation, management of robotic instrumentation, retraction and positioning due to the complexity of the case and hospital policies).   Anesthesia: Gen. endotracheal.  Specimens: Bilateral ovaries, fallopian tubes, pelvic washings  Estimated Blood Loss: <20 mL. Blood Replacement: None  Complications: none  Indication for Procedure:  The patient had a complex left ovarian cyst with an increased ROMA tumor marker score. Prior hysterectomy.  Operative Findings: dense pelvic adhesions between the sigmoid colon and left ovarian mass and pelvic sidewall which caused retroperitoneal fibrosis on the left requiring left ureterolysis and placement of an additional port and robotic instrument. Normal right tube and ovary. Normal upper abdomen.   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  Procedure: The patient's taken to the operating room and placed under general endotracheal anesthesia testing difficulty. She is placed in a dorsolithotomy position and cervical acromial pad was placed. The arms were tucked with care taken to pad the olecranon process. And prepped and draped in usual sterile fashion. A 32mm incision was made in the left upper quadrant palmer's point and a 5 mm Optiview trocar used to enter the abdomen under direct visualization. With entry into the abdomen and then maintenance of 15 mm of mercury the patient was placed in Trendelenburg position. An incision was made in the umbilicus and a [id number] trochar was placed through this site.  Two incisions were made lateral to the umbilical incision in the left and right abdomen measuring 66mm. These incisions were made approximately 10 cm lateral to the umbilical incision. 8 mm robotic trochars were inserted. The robot was docked.   The abdomen was inspected as was the pelvis. Pelvic washings were obtained.   For 20 minutes sharp adhesiolysis was performed that separated the left ovary from the sigmoid colon mesentery. An incision was made on the left pelvic side wall peritoneum parallel to the IP ligament and the retroperitoneal space entered. The left ureter was identified and the para-rectal space was developed. The left ureter was identified to be densely adherent to the ovarian cyst and was carefully dissected 360 degrees. This skeletonized the left ovarian vessels which were then cauterized and transected. There was careful dissection to separate the ovary from the sigmoid mesentery, then the residual attachments to the vaginal cuff were separated. Specimen was placed in an Endo Catch bag.  In a similar manner the right peritoneum and the side wall was incised, and the retroperitoneal space entered. The right ureter was identified and the right pararectal space was developed. The vaginal attachments ligament was skeletonized cauterized and transected. The left utero-ovarian ligaments were cauterized and transected in the left adnexa was placed in an Endo Catch bag.  The abdomen was copiously irrigated and drained and all operative sites inspected and hemostasis was assured   The robot was undocked. The contents of the left Endo Catch bag were first aspirated and then morcellated to facilitate removal from the abdominal cavity through the LUA incision. In a similar fashion the contents of the right Endo Catch bag or morcellated to facilitate removal from the abdominal cavity.  The ports were all remove. The fascial closure at the umbilical incision and left  upper quadrant port was made  with 0 Vicryl.  All incisions were closed with a running subcuticular Monocryl suture. Dermabond was applied. Sponge, lap and needle counts were correct x 3.    The patient had sequential compression devices for VTE prophylaxis.         Disposition: PACU - stable         Condition: stable  Donaciano Eva, MD

## 2018-11-25 NOTE — Transfer of Care (Signed)
Immediate Anesthesia Transfer of Care Note  Patient: [name]  Procedure(s) Performed: XI ROBOTIC ASSISTED BILATERAL SALPINGO OOPHORECTOMY and LYSIS OF ADHESIONS. (Bilateral )  Patient Location: PACU  Anesthesia Type:General  Level of Consciousness: awake, alert , oriented and patient cooperative  Airway & Oxygen Therapy: Patient Spontanous Breathing and Patient connected to face mask oxygen  Post-op Assessment: Report given to RN, Post -op Vital signs reviewed and stable and Patient moving all extremities  Post vital signs: Reviewed and stable  Last Vitals:  Vitals Value Taken Time  BP 125/85 11/25/18 0912  Temp    Pulse 103 11/25/18 0913  Resp 17 11/25/18 0913  SpO2 98 % 11/25/18 0913  Vitals shown include unvalidated device data.  Last Pain:  Vitals:   11/25/18 0634  TempSrc:   PainSc: 0-No pain         Complications: No apparent anesthesia complications

## 2018-11-25 NOTE — Interval H&P Note (Signed)
History and Physical Interval Note:  11/25/2018 7:01 AM  [name]  has presented today for surgery, with the diagnosis of LEFT OVARIAN MASS.  The various methods of treatment have been discussed with the patient and family. After consideration of risks, benefits and other options for treatment, the patient has consented to  Procedure(s): XI ROBOTIC [place] (Bilateral) as a surgical intervention.  The patient's history has been reviewed, patient examined, no change in status, stable for surgery.  I have reviewed the patient's chart and labs.  Questions were answered to the patient's satisfaction.     Thereasa Solo

## 2018-11-25 NOTE — Anesthesia Postprocedure Evaluation (Signed)
Anesthesia Post Note  Patient: [name]  Procedure(s) Performed: XI ROBOTIC ASSISTED BILATERAL SALPINGO OOPHORECTOMY and LYSIS OF ADHESIONS. (Bilateral )     Anesthesia Post Evaluation  Last Vitals:  Vitals:   11/25/18 1000 11/25/18 1015  BP: 136/87 127/83  Pulse: 93 93  Resp: 17 17  Temp:    SpO2: 93% 98%    Last Pain:  Vitals:   11/25/18 1015  TempSrc:   PainSc: 0-No pain                 Barnet Glasgow

## 2018-11-26 ENCOUNTER — Encounter (HOSPITAL_COMMUNITY): Payer: Self-pay | Admitting: Gynecologic Oncology

## 2018-11-26 ENCOUNTER — Telehealth: Payer: Self-pay

## 2018-11-26 NOTE — Telephone Encounter (Signed)
Spoke with [name] and she stated that she is doing well today. She is up and walking around. She is sore. Pain is controlled with Ibuprofen and Oxycodone. She is passing gas and urinting well. No burning with urination.  Told Tabitha Soifer to resume and complete  the bactrim for UTI  prior to her surgery per Melissa Cross,NP. Told Tabitha Degirolamo that the final surgical pathology showed no cancer. She has a post op appointment on 12-19-18 at 3:15 pm  with Dr. Denman George. Pt knows to call the office at [phone] if she has any questions or concerns.

## 2018-12-09 DIAGNOSIS — R69 Illness, unspecified: Secondary | ICD-10-CM | POA: Diagnosis not present

## 2018-12-16 ENCOUNTER — Inpatient Hospital Stay: Admission: RE | Admit: 2018-12-16 | Payer: Medicare HMO | Source: Ambulatory Visit

## 2018-12-19 ENCOUNTER — Inpatient Hospital Stay: Payer: Medicare HMO | Attending: Gynecologic Oncology | Admitting: Gynecologic Oncology

## 2018-12-19 ENCOUNTER — Other Ambulatory Visit: Payer: Self-pay

## 2018-12-19 ENCOUNTER — Encounter: Payer: Self-pay | Admitting: Gynecologic Oncology

## 2018-12-19 VITALS — BP 93/71 | HR 91 | Temp 98.2°F | Resp 20 | Ht 67.0 in | Wt 140.4 lb

## 2018-12-19 DIAGNOSIS — Z90722 Acquired absence of ovaries, bilateral: Secondary | ICD-10-CM | POA: Insufficient documentation

## 2018-12-19 DIAGNOSIS — N838 Other noninflammatory disorders of ovary, fallopian tube and broad ligament: Secondary | ICD-10-CM

## 2018-12-19 DIAGNOSIS — Z90710 Acquired absence of both cervix and uterus: Secondary | ICD-10-CM | POA: Diagnosis not present

## 2018-12-19 DIAGNOSIS — N83202 Unspecified ovarian cyst, left side: Secondary | ICD-10-CM | POA: Insufficient documentation

## 2018-12-19 NOTE — Patient Instructions (Signed)
No cancer was found in either of your ovaries.  Dr Denman George recommends annual check ups with Dr Simona Huh to screen for vaginal cancer.  You are cleared for all activity resumption.  Please call [phone] if you have any questions related to your surgery.

## 2018-12-19 NOTE — Progress Notes (Signed)
Post-op Follow-up Note: Gyn-Onc  Consult was requested by Dr. Simona Huh for the evaluation of [name] 63 y.o. female  CC:  Chief Complaint  Patient presents with  . Ovarian Cyst    postop follow-up    Assessment/Plan:  [name]  is a 63 y.o.  year old with a history of a complex left ovarian mass and elevated Roma score with mildly elevated HE4.  She is status post robotic assisted BSO on November 25, 2022 pathology findings of benign tubes and ovaries bilaterally.  She is doing well postoperatively and needs no further follow-up with me.  She will follow-up long-term with Dr.Varnado given her history of cervical dysplasia and need for ongoing vaginal Pap surveillance.  HPI: The patient is a 63 year old woman who was seen by her primary care doctor at [place] in early August 2020 with reports of increasing weight gain and occasional pelvic pains.  This prompted her having a CT scan of the abdomen and pelvis with contrast that was performed on October 23, 2018.  This revealed status post hysterectomy, no right ovary seen, a complex cystic mass seen in the left adnexa measuring 4.8 x 4.7 x 5 cm.  The left gonadal vein was transiting into this structure suggesting of the left ovary.  There is no ascites, carcinomatosis, or lymphadenopathy seen.  A follow-up scan with an ultrasound of the pelvis was performed on November 03, 2018.  This revealed a surgically absent uterus.  The right ovary was not visualized.  The left ovary measured 5.1 x 3.7 x 3.3 cm and contained a 4.8 x 3.9 x 5.1 cm cystic lesion containing multiple septations and scattered internal echogenicity.  The septations measured up to 3 mm in thickness.  There was no pelvic free fluid seen.  A Roma score was obtained on November 06, 2018 and was high for postmenopausal score (3.54).  The Ca1 25 was 29.1 which was within normal limits, and the HD4 was slightly elevated at 125 (upper limit of normal 96.5).  The patient  has a history of a normal colonoscopy earlier in 2020.  She had a Pap test performed of the vaginal vault on October 27, 2018 that was normal with no high risk HPV detected.  The patient reports a past medical history of cervical dysplasia treated with a total abdominal hysterectomy in the 1980s in [place].  She has a history of a pacemaker placed approximately 4 years ago at [facility].  Her cardiologist is through [facility].  She has irregular beats.  She does not take anticoagulation or antiplatelet therapy.  Her only other significant medical history is for depression.  Her surgical history is remarkable for the prior abdominal hysterectomy, and posterior approach back and neck fusion surgeries.  Family history is remarkable for a maternal aunt who had a diagnosis of multiple myeloma, her daughter is known to be Brekke negative as she had undergone genetic testing for other reasons.  Interval Hx:  On November 25, 2018 she underwent robotically assisted bilateral salpingo-oophorectomy and lysis of adhesions with left ureteral lysis.  Intraoperative findings were significant for dense pelvic adhesions between the sigmoid colon and the left ovarian mass and the pelvic sidewall which caused retroperitoneal fibrosis on the left requiring left ureteral lysis and placement of an additional port and robotic instrumentation.  The right tube and ovary were grossly normal as was the upper abdomen.  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  On the right there was a serous cyst that was benign and no evidence of malignancy in the tube or ovary.  Since surgery she has been healing well with no specific complaints.  Current Meds:  Outpatient Encounter Medications as of 12/19/2018  Medication Sig  . ALPRAZolam (XANAX) 1 MG tablet alprazolam 1 mg tablet  TAKE 1 TABLET BY MOUTH 4 TIMES A DAY (Patient  taking differently: Take 1 mg by mouth 4 (four) times daily. )  . busPIRone (BUSPAR) 5 MG tablet Take 1 tablet (5 mg total) by mouth 3 (three) times daily.  . cyclobenzaprine (FLEXERIL) 10 MG tablet Take 10 mg by mouth 2 (two) times daily as needed for muscle spasms.  . hydrOXYzine (VISTARIL) 25 MG capsule Take 25 mg by mouth 3 (three) times daily as needed for anxiety.  Marland Kitchen ibuprofen (ADVIL) 600 MG tablet Take 1 tablet (600 mg total) by mouth every 6 (six) hours as needed for moderate pain. For AFTER surgery only  . Melatonin 5 MG CAPS Take 5 mg by mouth at bedtime as needed (sleep.).   Marland Kitchen mirtazapine (REMERON) 45 MG tablet TAKE 1.5 TABLETS (67 MG TOTAL) BY MOUTH AT BEDTIME.  Marland Kitchen omeprazole (PRILOSEC) 40 MG capsule Take 40 mg by mouth at bedtime.   Marland Kitchen oxyCODONE (OXY IR/ROXICODONE) 5 MG immediate release tablet Take 1 tablet (5 mg total) by mouth every 4 (four) hours as needed for severe pain. For AFTER surgery, do not take and drive  . risperiDONE (RISPERDAL) 2 MG tablet Take 1 tablet (2 mg total) by mouth 2 (two) times daily. (Patient taking differently: Take 2 mg by mouth at bedtime. )  . rosuvastatin (CRESTOR) 20 MG tablet Take 1 tablet (20 mg total) by mouth daily. (Patient taking differently: Take 20 mg by mouth at bedtime. )  . senna-docusate (SENOKOT-S) 8.6-50 MG tablet Take 2 tablets by mouth at bedtime. For AFTER surgery, do not take if having diarrhea  . sertraline (ZOLOFT) 100 MG tablet Take 2 tablets (200 mg total) by mouth daily. (Patient taking differently: Take 200 mg by mouth at bedtime. )  . sulfamethoxazole-trimethoprim (BACTRIM DS) 800-160 MG tablet Take 1 tablet by mouth 2 (two) times daily.   No facility-administered encounter medications on file as of 12/19/2018.     Allergy:  Allergies  Allergen Reactions  . Penicillins Other (See Comments)    Yeast infection Did it involve swelling of the face/tongue/throat, SOB, or low BP? No Did it involve sudden or severe rash/hives, skin  peeling, or any reaction on the inside of your mouth or nose? No Did you need to seek medical attention at a hospital or doctor's office? Yes When did it last happen? More than 5 years ago If all above answers are "NO", may proceed with cephalosporin use.   . Prednisone Other (See Comments)    Bladder infections    Social Hx:   Social History   Socioeconomic History  . Marital status: Widowed    Spouse name: Not on file  . Number of children: Not on file  . Years of education: Not on file  . Highest education level: Not on file  Occupational History  . Not on file  Social Needs  . Financial resource strain: Not on file  . Food insecurity    Worry: Not on file    Inability: Not on file  . Transportation needs    Medical: Not on file  Non-medical: Not on file  Tobacco Use  . Smoking status: Current Every Day Smoker    Packs/day: 0.25    Years: 40.00    Pack years: 10.00    Types: Cigarettes  . Smokeless tobacco: Never Used  . Tobacco comment: 14 cigarette per day  Substance and Sexual Activity  . Alcohol use: No  . Drug use: No  . Sexual activity: Yes  Lifestyle  . Physical activity    Days per week: Not on file    Minutes per session: Not on file  . Stress: Not on file  Relationships  . Social Herbalist on phone: Not on file    Gets together: Not on file    Attends religious service: Not on file    Active member of club or organization: Not on file    Attends meetings of clubs or organizations: Not on file    Relationship status: Not on file  . Intimate partner violence    Fear of current or ex partner: Not on file    Emotionally abused: Not on file    Physically abused: Not on file    Forced sexual activity: Not on file  Other Topics Concern  . Not on file  Social History Narrative   Married 29 years . Has 3 children. Current smoker -1 pack a day-for 36 years .Alcohol :  1 beer on occasion.   She is soon to be a grandmother.  His before to  going on a trip up to the [place] area to be closer to family when the baby is born.  She is extremely excited.   She very much would like to move back up to that area to be closer to family, but the whether is it just too cold in the winter.    Past Surgical Hx:  Past Surgical History:  Procedure Laterality Date  . ABDOMINAL HYSTERECTOMY  03/19/1980   "partial"  . BACK SURGERY    . CERVICAL FUSION Left 11/2013   C4-C6  . CERVICAL FUSION     3,4,5,  . INSERT / REPLACE / REMOVE PACEMAKER  02/20/2013   medtronic  . LOOP RECORDER IMPLANT N/A 11/06/2012   Procedure: LINQ LOOP RECORDER IMPLANT;  Surgeon: Sanda Klein, MD;  Location: [facility];  Service: Cardiovascular;  Laterality: N/A;  . NECK SURGERY  11/17/2013  . PACEMAKER PLACEMENT N/A 02/16/2013  . PERMANENT PACEMAKER INSERTION N/A 02/20/2013   Procedure: PERMANENT PACEMAKER INSERTION;  Surgeon: Sanda Klein, MD;  Location: [facility];  Service: Cardiovascular;  Laterality: N/A;  . POSTERIOR LUMBAR FUSION  ~ 2009  . ROBOTIC ASSISTED SALPINGO OOPHERECTOMY Bilateral 11/25/2018   Procedure: XI ROBOTIC ASSISTED BILATERAL SALPINGO OOPHORECTOMY and LYSIS OF ADHESIONS.;  Surgeon: Everitt Amber, MD;  Location: [facility];  Service: Gynecology;  Laterality: Bilateral;  . SPINAL FUSION  03/19/2014  . TONSILLECTOMY    . TRANSTHORACIC ECHOCARDIOGRAM  07/11/2010   The left atrial size is normal.There is no evidence of mitral vavle prolaspe.Right ventriicular systolic pressure is normal . Injection of contrast documented no interatrial shunt. Essentially normal 2D echo -doppler study     Past Medical Hx:  Past Medical History:  Diagnosis Date  . Anxiety    anxiety and mild depressive order systoms  . Bipolar disorder ([place])     (02/20/2013)  . Cervical cancer ([place]) 03/19/1980  . Depression   . Dysrhythmia   . MIWOEHOZ(224.8)    "monthly" (02/20/2013)  . Pacemaker  medtronic  . PONV (postoperative nausea and vomiting)   . Sinus  pause 02/14/2013  . Syncope and collapse    echo-April 12,2012-EF 55% Echo normal; recorder with prolonged sinus pauses --> status post  . Tobacco abuse   . Vertigo     Past Gynecological History:   No LMP recorded. Patient has had a hysterectomy.  Family Hx:  Family History  Adopted: Yes  Problem Relation Age of Onset  . Heart attack Brother     Review of Systems:  Constitutional  Feels well,   ENT Normal appearing ears and nares bilaterally Skin/Breast  No rash, sores, jaundice, itching, dryness Cardiovascular  No chest pain, shortness of breath, or edema  Pulmonary  No cough or wheeze.  Gastro Intestinal  No nausea, vomitting, or diarrhoea. No bright red blood per rectum, no abdominal pain, change in bowel movement, or constipation.  Genito Urinary  No frequency, urgency, dysuria, no bleeding Musculo Skeletal  No myalgia, arthralgia, joint swelling or pain  Neurologic  No weakness, numbness, change in gait,  Psychology  No depression, anxiety, insomnia.   Vitals:  Blood pressure 93/71, pulse 91, temperature 98.2 F (36.8 C), temperature source Temporal, resp. rate 20, height '5\' 7"'  (1.702 m), weight 140 lb 6.4 oz (63.7 kg), SpO2 98 %.  Physical Exam: WD in NAD Neck  Supple NROM, without any enlargements.  Lymph Node Survey No cervical supraclavicular or inguinal adenopathy Cardiovascular  Pulse normal rate, regularity and rhythm. S1 and S2 normal.  Lungs  Clear to auscultation bilateraly, without wheezes/crackles/rhonchi. Good air movement.  Skin  No rash/lesions/breakdown  Psychiatry  Alert and oriented to person, place, and time  Abdomen  Normoactive bowel sounds, abdomen soft, non-tender and nonobese without evidence of hernia. Well healed incisions.  Back No CVA tenderness Genito Urinary  deferred Rectal  deferred Extremities  No bilateral cyanosis, clubbing or edema.   Thereasa Solo, MD  12/19/2018, 5:01 PM

## 2018-12-30 DIAGNOSIS — M545 Low back pain: Secondary | ICD-10-CM | POA: Diagnosis not present

## 2018-12-30 DIAGNOSIS — M5416 Radiculopathy, lumbar region: Secondary | ICD-10-CM | POA: Diagnosis not present

## 2018-12-30 DIAGNOSIS — G894 Chronic pain syndrome: Secondary | ICD-10-CM | POA: Diagnosis not present

## 2019-01-01 ENCOUNTER — Other Ambulatory Visit: Payer: Self-pay | Admitting: Chiropractic Medicine

## 2019-01-01 DIAGNOSIS — M545 Low back pain, unspecified: Secondary | ICD-10-CM

## 2019-01-06 ENCOUNTER — Ambulatory Visit
Admission: RE | Admit: 2019-01-06 | Discharge: 2019-01-06 | Disposition: A | Discharge status: Home / Self Care | Payer: Medicare HMO | Source: Ambulatory Visit | Attending: Chiropractic Medicine | Admitting: Chiropractic Medicine

## 2019-01-06 DIAGNOSIS — M545 Low back pain, unspecified: Secondary | ICD-10-CM

## 2019-01-06 DIAGNOSIS — M5127 Other intervertebral disc displacement, lumbosacral region: Secondary | ICD-10-CM | POA: Diagnosis not present

## 2019-01-09 DIAGNOSIS — M5416 Radiculopathy, lumbar region: Secondary | ICD-10-CM | POA: Diagnosis not present

## 2019-01-15 DIAGNOSIS — M961 Postlaminectomy syndrome, not elsewhere classified: Secondary | ICD-10-CM | POA: Diagnosis not present

## 2019-01-15 DIAGNOSIS — M5416 Radiculopathy, lumbar region: Secondary | ICD-10-CM | POA: Diagnosis not present

## 2019-01-19 ENCOUNTER — Inpatient Hospital Stay: Admission: RE | Admit: 2019-01-19 | Payer: Medicare HMO | Source: Ambulatory Visit

## 2019-01-20 ENCOUNTER — Encounter: Payer: Medicare HMO | Admitting: *Deleted

## 2019-01-26 ENCOUNTER — Telehealth: Payer: Self-pay | Admitting: Physician Assistant

## 2019-01-26 NOTE — Telephone Encounter (Signed)
Error

## 2019-01-27 ENCOUNTER — Other Ambulatory Visit: Payer: Self-pay | Admitting: Physician Assistant

## 2019-01-27 DIAGNOSIS — G4709 Other insomnia: Secondary | ICD-10-CM

## 2019-01-29 DIAGNOSIS — M961 Postlaminectomy syndrome, not elsewhere classified: Secondary | ICD-10-CM | POA: Diagnosis not present

## 2019-01-29 DIAGNOSIS — K5903 Drug induced constipation: Secondary | ICD-10-CM | POA: Diagnosis not present

## 2019-01-30 DIAGNOSIS — R350 Frequency of micturition: Secondary | ICD-10-CM | POA: Diagnosis not present

## 2019-01-30 DIAGNOSIS — Z720 Tobacco use: Secondary | ICD-10-CM | POA: Diagnosis not present

## 2019-02-04 DIAGNOSIS — Z981 Arthrodesis status: Secondary | ICD-10-CM | POA: Diagnosis not present

## 2019-02-10 ENCOUNTER — Other Ambulatory Visit: Payer: Self-pay

## 2019-02-10 ENCOUNTER — Ambulatory Visit: Payer: Medicare HMO | Admitting: Neurology

## 2019-02-10 ENCOUNTER — Encounter: Payer: Self-pay | Admitting: Neurology

## 2019-02-10 DIAGNOSIS — Z981 Arthrodesis status: Secondary | ICD-10-CM

## 2019-02-10 DIAGNOSIS — R208 Other disturbances of skin sensation: Secondary | ICD-10-CM

## 2019-02-10 DIAGNOSIS — M5416 Radiculopathy, lumbar region: Secondary | ICD-10-CM | POA: Diagnosis not present

## 2019-02-10 NOTE — Progress Notes (Signed)
[facility]  PATIENT: [name] DOB: December 14, 1955  REFERRING DOCTOR OR PCP: Dr. Rolena Infante SOURCE: Patient, notes from Dr. Rolena Infante, imaging reports and lumbar MRI and CT images personally reviewed.   _________________________________   HISTORICAL  CHIEF COMPLAINT:  Chief Complaint  Patient presents with  . New Patient (Initial Visit)    Rm 13; referred by Melina Schools, MD pt reports nerve pain in legs/ lower back- pt reports pain starts in the am and is constant throughout the day.     HISTORY OF PRESENT ILLNESS:  I had the pleasure of seeing your patient, [name], at [facility] for neurologic consultation regarding pain in the legs and lower back.  She is a 63 year old woman who has had a 2-3 months of more lower back pain/buttock pain with anterior leg pain, right = left, going down the thighs to the ankles.   No activity or injury was associated with the onset of symptoms.  There was no foot pain.     She had an ESI 3 weeks ago and felt better x 2 days and then pain returned.    Pain persisted until 2 days ago and now she has no pain.    When pain was present, Flexeril and oxycodone did not help  She denies any muscle weakness or numbness in her legs.    Bladder function is normal.    Gait is fine.    She had L4L5 lumbar fusion in 2007 with a revision 2-3 years later (Dr. Rolena Infante).    Data reviewed: CT scan lumbar spine 01/06/2019 reviewed.  I concur with the radiologist impression below. IMPRESSION:  1. Prior fusion and posterior decompression at the L4-5 level with incorporation within the intervertebral disc spacer, however minimal incorporation across the remaining endplates at this level. Hardware is intact without evidence of loosening.  2. Probable mild canal stenosis at the L3-4 level.  3. L4-5 far lateral disc material appears to contact the exited left L4 nerve root.  4. Central disc protrusion at L5-S1 without evidence of  canal stenosis.  MRI of the lumbar spine was personally reviewed from 09/11/2005.  It showed mild spinal stenosis at L4-L5 due to disc bulging, facet hypertrophy and ligamenta flava hypertrophy.  There was lateral recess stenosis.  L5-S1 showed facet hypertrophy but no spinal stenosis or foraminal narrowing.  Other levels appeared normal.  REVIEW OF SYSTEMS: Constitutional: No fevers, chills, sweats, or change in appetite Eyes: No visual changes, double vision, eye pain Ear, nose and throat: No hearing loss, ear pain, nasal congestion, sore throat Cardiovascular: No chest pain, palpitations Respiratory: No shortness of breath at rest or with exertion.   No wheezes GastrointestinaI: No nausea, vomiting, diarrhea, abdominal pain, fecal incontinence Genitourinary: No dysuria, urinary retention or frequency.  No nocturia. Musculoskeletal:As above  integumentary: No rash, pruritus, skin lesions Neurological: as above Psychiatric: No depression at this time.  No anxiety Endocrine: No palpitations, diaphoresis, change in appetite, change in weigh or increased thirst Hematologic/Lymphatic: No anemia, purpura, petechiae. Allergic/Immunologic: No itchy/runny eyes, nasal congestion, recent allergic reactions, rashes  ALLERGIES: Allergies  Allergen Reactions  . Penicillins Other (See Comments)    Yeast infection Did it involve swelling of the face/tongue/throat, SOB, or low BP? No Did it involve sudden or severe rash/hives, skin peeling, or any reaction on the inside of your mouth or nose? No Did you need to seek medical attention at a hospital or doctor's office? Yes When did it last happen? More  than 5 years ago If all above answers are "NO", may proceed with cephalosporin use.     HOME MEDICATIONS:  Current Outpatient Medications:  .  ALPRAZolam (XANAX) 1 MG tablet, alprazolam 1 mg tablet  TAKE 1 TABLET BY MOUTH 4 TIMES A DAY (Patient taking differently: Take 1 mg by mouth 4 (four)  times daily. ), Disp: 120 tablet, Rfl: 3 .  busPIRone (BUSPAR) 5 MG tablet, Take 1 tablet (5 mg total) by mouth 3 (three) times daily., Disp: 90 tablet, Rfl: 3 .  cyclobenzaprine (FLEXERIL) 10 MG tablet, Take 10 mg by mouth 2 (two) times daily as needed for muscle spasms., Disp: , Rfl:  .  hydrOXYzine (VISTARIL) 25 MG capsule, TAKE 1 CAPSULE (25 MG TOTAL) BY MOUTH 3 (THREE) TIMES DAILY AS NEEDED., Disp: 270 capsule, Rfl: 0 .  ibuprofen (ADVIL) 600 MG tablet, Take 1 tablet (600 mg total) by mouth every 6 (six) hours as needed for moderate pain. For AFTER surgery only, Disp: 30 tablet, Rfl: 0 .  Melatonin 5 MG CAPS, Take 5 mg by mouth at bedtime as needed (sleep.). , Disp: , Rfl:  .  mirtazapine (REMERON) 45 MG tablet, TAKE 1.5 TABLETS (67 MG TOTAL) BY MOUTH AT BEDTIME., Disp: 135 tablet, Rfl: 0 .  omeprazole (PRILOSEC) 40 MG capsule, Take 40 mg by mouth at bedtime. , Disp: , Rfl: 5 .  oxyCODONE (OXY IR/ROXICODONE) 5 MG immediate release tablet, Take 1 tablet (5 mg total) by mouth every 4 (four) hours as needed for severe pain. For AFTER surgery, do not take and drive, Disp: 10 tablet, Rfl: 0 .  risperiDONE (RISPERDAL) 2 MG tablet, Take 1 tablet (2 mg total) by mouth 2 (two) times daily. (Patient taking differently: Take 2 mg by mouth at bedtime. ), Disp: 60 tablet, Rfl: 3 .  sertraline (ZOLOFT) 100 MG tablet, Take 2 tablets (200 mg total) by mouth daily. (Patient taking differently: Take 200 mg by mouth at bedtime. ), Disp: 60 tablet, Rfl: 3  PAST MEDICAL HISTORY: Past Medical History:  Diagnosis Date  . Anxiety    anxiety and mild depressive order systoms  . Bipolar disorder ([place])     (02/20/2013)  . Cervical cancer ([place]) 03/19/1980  . Depression   . Dysrhythmia   . [id number])    "monthly" (02/20/2013)  . Pacemaker    medtronic  . PONV (postoperative nausea and vomiting)   . Sinus pause 02/14/2013  . Syncope and collapse    echo-April 12,2012-EF 55% Echo normal; recorder with  prolonged sinus pauses --> status post  . Tobacco abuse   . Vertigo     PAST SURGICAL HISTORY: Past Surgical History:  Procedure Laterality Date  . ABDOMINAL HYSTERECTOMY  03/19/1980   "partial"  . BACK SURGERY    . CERVICAL FUSION Left 11/2013   C4-C6  . CERVICAL FUSION     3,4,5,  . INSERT / REPLACE / REMOVE PACEMAKER  02/20/2013   medtronic  . LOOP RECORDER IMPLANT N/A 11/06/2012   Procedure: LINQ LOOP RECORDER IMPLANT;  Surgeon: Sanda Klein, MD;  Location: [facility];  Service: Cardiovascular;  Laterality: N/A;  . NECK SURGERY  11/17/2013  . PACEMAKER PLACEMENT N/A 02/16/2013  . PERMANENT PACEMAKER INSERTION N/A 02/20/2013   Procedure: PERMANENT PACEMAKER INSERTION;  Surgeon: Sanda Klein, MD;  Location: [facility];  Service: Cardiovascular;  Laterality: N/A;  . POSTERIOR LUMBAR FUSION  ~ 2009  . ROBOTIC ASSISTED SALPINGO OOPHERECTOMY Bilateral 11/25/2018   Procedure: XI ROBOTIC ASSISTED  BILATERAL SALPINGO OOPHORECTOMY and LYSIS OF ADHESIONS.;  Surgeon: Everitt Amber, MD;  Location: [facility];  Service: Gynecology;  Laterality: Bilateral;  . SPINAL FUSION  03/19/2014  . TONSILLECTOMY    . TRANSTHORACIC ECHOCARDIOGRAM  07/11/2010   The left atrial size is normal.There is no evidence of mitral vavle prolaspe.Right ventriicular systolic pressure is normal . Injection of contrast documented no interatrial shunt. Essentially normal 2D echo -doppler study     FAMILY HISTORY: Family History  Adopted: Yes  Problem Relation Age of Onset  . Heart attack Brother     SOCIAL HISTORY:  Social History   Socioeconomic History  . Marital status: Widowed    Spouse name: Not on file  . Number of children: Not on file  . Years of education: Not on file  . Highest education level: Not on file  Occupational History  . Not on file  Social Needs  . Financial resource strain: Not on file  . Food insecurity    Worry: Not on file    Inability: Not on file  . Transportation needs     Medical: Not on file    Non-medical: Not on file  Tobacco Use  . Smoking status: Current Every Day Smoker    Packs/day: 0.25    Years: 40.00    Pack years: 10.00    Types: Cigarettes  . Smokeless tobacco: Never Used  . Tobacco comment: 14 cigarette per day  Substance and Sexual Activity  . Alcohol use: No  . Drug use: No  . Sexual activity: Yes  Lifestyle  . Physical activity    Days per week: Not on file    Minutes per session: Not on file  . Stress: Not on file  Relationships  . Social Herbalist on phone: Not on file    Gets together: Not on file    Attends religious service: Not on file    Active member of club or organization: Not on file    Attends meetings of clubs or organizations: Not on file    Relationship status: Not on file  . Intimate partner violence    Fear of current or ex partner: Not on file    Emotionally abused: Not on file    Physically abused: Not on file    Forced sexual activity: Not on file  Other Topics Concern  . Not on file  Social History Narrative   Married 29 years . Has 3 children. Current smoker -1 pack a day-for 36 years .Alcohol :  1 beer on occasion.   She is soon to be a grandmother.  His before to going on a trip up to the [place] area to be closer to family when the baby is born.  She is extremely excited.   She very much would like to move back up to that area to be closer to family, but the whether is it just too cold in the winter.     PHYSICAL EXAM  Vitals:   02/10/19 1112  BP: (!) 130/92  Pulse: (!) 115  Temp: (!) 97.4 F (36.3 C)  TempSrc: Temporal  Weight: 137 lb 5 oz (62.3 kg)  Height: 5\' 7"  (1.702 m)    Body mass index is 21.51 kg/m.   General: The patient is well-developed and well-nourished and in no acute distress  HEENT:  Head is [place]/AT.  Sclera are anicteric.    Neck:The neck is nontender with good range of motion  Skin: Extremities are without rash or  edema.   Musculoskeletal:  Back is mildly tender in the lower lumbar paraspinal region  Neurologic Exam  Mental status: The patient is alert and oriented x 3 at the time of the examination. The patient has apparent normal recent and remote memory, with an apparently normal attention span and concentration ability.   Speech is normal.  Cranial nerves: Extraocular movements are full. There is good facial sensation to soft touch bilaterally.Facial strength is normal. No obvious hearing deficits are noted.  Motor:  Muscle bulk is normal.   Tone is normal. Strength is  5 / 5 in all 4 extremities except 4+/5 right EHL.     Sensory: Sensory testing showed reduced touch sensation in the right leg (anterolateral lower thigh to shin.   Feet and medial aspect of legs had symmetric sensation.   symmetric vibration in the legs.    Coordination: Cerebellar testing reveals good finger-nose-finger and heel-to-shin bilaterally.  Gait and station: Station is normal.   Gait is mildly wide an she has difficulty with tandem gait.  . Romberg is negative.   Reflexes: Deep tendon reflexes are symmetric and normal bilaterally.       DIAGNOSTIC DATA (LABS, IMAGING, TESTING) - I reviewed patient records, labs, notes, testing and imaging myself where available.  Lab Results  Component Value Date   WBC 21.7 (H) 11/21/2018   HGB 14.7 11/21/2018   HCT 46.7 (H) 11/21/2018   MCV 92.3 11/21/2018   PLT 354 11/21/2018      Component Value Date/Time   NA 141 11/21/2018 1017   K 3.7 11/21/2018 1017   CL 107 11/21/2018 1017   CO2 24 11/21/2018 1017   GLUCOSE 122 (H) 11/21/2018 1017   BUN 10 11/21/2018 1017   CREATININE 0.84 11/21/2018 1017   CREATININE 1.08 (H) 09/24/2018 1019   CALCIUM 9.3 11/21/2018 1017   PROT 7.7 11/21/2018 1017   ALBUMIN 4.4 11/21/2018 1017   AST 20 11/21/2018 1017   ALT 22 11/21/2018 1017   ALKPHOS 70 11/21/2018 1017   BILITOT 0.6 11/21/2018 1017   GFRNONAA >60 11/21/2018 1017   GFRNONAA 56  (L) 09/24/2018 1019   GFRAA >60 11/21/2018 1017   GFRAA 65 09/24/2018 1019   Lab Results  Component Value Date   CHOL 256 (H) 09/24/2018   HDL 50 09/24/2018   LDLCALC 179 (H) 09/24/2018   TRIG 130 09/24/2018   CHOLHDL 5.1 (H) 09/24/2018       ASSESSMENT AND PLAN  Lumbar radiculopathy  History of fusion of lumbar spine  Dysesthesia  In summary, Ms. Gerrity is a 63 year old woman with pain in the legs that lasted a couple months but has drastically improved over the past few days.  The examination was normal except for mildly reduced right EHL strength and mild asymmetric sensation in the L4 dermatomes in the legs.  These could be sequela of her previous spinal stenosis and surgery or could be related to her recent pain and probability of radiculopathy.  Because she is practically back to baseline, no medication or further studies are necessary at this time.  However, if pain returns consider a repeat ESI or centrally acting agent such as gabapentin.  Also consider obtaining CT myelogram to better evaluate the mid and lower lumbar spine and associated nerve roots.  Thank you for asking me to see Ms. Younker.  Please let me know if I can be of further assistance with her or other patients in the  future.   Won Kreuzer A. Felecia Shelling, MD, [facility] [id number], [id number] PM Certified in Neurology, Clinical Neurophysiology, Sleep Medicine and Neuroimaging  [facility] [address] [place], [place] [address] [phone]

## 2019-02-20 ENCOUNTER — Ambulatory Visit (INDEPENDENT_AMBULATORY_CARE_PROVIDER_SITE_OTHER): Payer: Medicare HMO | Admitting: Physician Assistant

## 2019-02-20 ENCOUNTER — Encounter: Payer: Self-pay | Admitting: Physician Assistant

## 2019-02-20 ENCOUNTER — Other Ambulatory Visit: Payer: Self-pay

## 2019-02-20 DIAGNOSIS — F411 Generalized anxiety disorder: Secondary | ICD-10-CM | POA: Diagnosis not present

## 2019-02-20 DIAGNOSIS — F331 Major depressive disorder, recurrent, moderate: Secondary | ICD-10-CM

## 2019-02-20 DIAGNOSIS — R69 Illness, unspecified: Secondary | ICD-10-CM | POA: Diagnosis not present

## 2019-02-20 DIAGNOSIS — G4700 Insomnia, unspecified: Secondary | ICD-10-CM

## 2019-02-20 DIAGNOSIS — G4709 Other insomnia: Secondary | ICD-10-CM | POA: Diagnosis not present

## 2019-02-20 MED ORDER — RISPERIDONE 2 MG PO TABS: 2.0000 mg | ORAL_TABLET | Freq: Every day | ORAL | 0 refills | Status: DC

## 2019-02-20 MED ORDER — SERTRALINE HCL 100 MG PO TABS: 200.0000 mg | ORAL_TABLET | Freq: Every day | ORAL | 0 refills | Status: DC

## 2019-02-20 MED ORDER — ALPRAZOLAM 1 MG PO TABS: 1.0000 mg | ORAL_TABLET | Freq: Four times a day (QID) | ORAL | 2 refills | Status: DC

## 2019-02-20 MED ORDER — DIAZEPAM 2 MG PO TABS: ORAL_TABLET | ORAL | 0 refills | Status: DC

## 2019-02-20 MED ORDER — MIRTAZAPINE 45 MG PO TABS: 67.0000 mg | ORAL_TABLET | Freq: Every day | ORAL | 0 refills | Status: DC

## 2019-02-20 NOTE — Progress Notes (Signed)
[facility]  Patient ID: [name],  MRN: [id number]  PCP: Pa, [place]  Date of Evaluation: 02/20/2019 Time spent:15 minutes  Chief Complaint:  Chief Complaint    Follow-up     Virtual Visit via Telephone Note  I connected with patient by a video enabled telemedicine application or telephone, with their informed consent, and verified patient privacy and that I am speaking with the correct person using two identifiers.  I am private, in my office and the patient is home.   I discussed the limitations, risks, security and privacy concerns of performing an evaluation and management service by telephone and the availability of in person appointments. I also discussed with the patient that there may be a patient responsible charge related to this service. The patient expressed understanding and agreed to proceed.   I discussed the assessment and treatment plan with the patient. The patient was provided an opportunity to ask questions and all were answered. The patient agreed with the plan and demonstrated an understanding of the instructions.   The patient was advised to call back or seek an in-person evaluation if the symptoms worsen or if the condition fails to improve as anticipated.  I provided 15 minutes of non-face-to-face time during this encounter.  HISTORY/CURRENT STATUS: HPI for routine med check.  Will be flying to [place] for Christmas.  Is afraid of flying and in the past, she was given Valium 2 mg, 2 p.o. preflight.  She request a prescription for that.  Still reports a lot of generalized anxiety.  She saw Deloria Lair, NP at the last visit, who started her on BuSpar.  The patient states she never started it even though she told me she did.  The Xanax helps the panic attacks but she still has anxiety throughout the day even though she is taking the Xanax.  She has racing thoughts.  States the depression is pretty well controlled  with current medications.  She is able to enjoy some things.  Energy and motivation are fair to good, she does not cry easily.  She denies suicidal or homicidal thoughts.  Patient denies increased energy with decreased need for sleep, no increased talkativeness, no racing thoughts, no impulsivity or risky behaviors, no increased spending, no increased libido, no grandiosity.  Denies dizziness, syncope, seizures, numbness, tingling, tremor, tics, unsteady gait, slurred speech, confusion. Denies muscle or joint pain, stiffness, or dystonia.  Individual Medical History/ Review of Systems: Changes? :No    Past medications for mental health diagnoses include: Trazodone, Belsomra, Risperdal, Zoloft, hydroxyzine, mirtazapine, Xanax, Valium, Wellbutrin, Abilify, gabapentin, Strattera, Vyvanse, Ambien, Lunesta, Latuda  Allergies: Penicillins  Current Medications:  Current Outpatient Medications:  .  ALPRAZolam (XANAX) 1 MG tablet, Take 1 tablet (1 mg total) by mouth 4 (four) times daily., Disp: 120 tablet, Rfl: 2 .  busPIRone (BUSPAR) 5 MG tablet, Take 1 tablet (5 mg total) by mouth 3 (three) times daily., Disp: 90 tablet, Rfl: 3 .  cyclobenzaprine (FLEXERIL) 10 MG tablet, Take 10 mg by mouth 2 (two) times daily as needed for muscle spasms., Disp: , Rfl:  .  hydrOXYzine (VISTARIL) 25 MG capsule, TAKE 1 CAPSULE (25 MG TOTAL) BY MOUTH 3 (THREE) TIMES DAILY AS NEEDED., Disp: 270 capsule, Rfl: 0 .  ibuprofen (ADVIL) 600 MG tablet, Take 1 tablet (600 mg total) by mouth every 6 (six) hours as needed for moderate pain. For AFTER surgery only, Disp: 30 tablet, Rfl: 0 .  Melatonin 5  MG CAPS, Take 5 mg by mouth at bedtime as needed (sleep.). , Disp: , Rfl:  .  mirtazapine (REMERON) 45 MG tablet, Take 1.5 tablets (67 mg total) by mouth at bedtime., Disp: 135 tablet, Rfl: 0 .  omeprazole (PRILOSEC) 40 MG capsule, Take 40 mg by mouth at bedtime. , Disp: , Rfl: 5 .  oxyCODONE (OXY IR/ROXICODONE) 5 MG immediate  release tablet, Take 1 tablet (5 mg total) by mouth every 4 (four) hours as needed for severe pain. For AFTER surgery, do not take and drive, Disp: 10 tablet, Rfl: 0 .  risperiDONE (RISPERDAL) 2 MG tablet, Take 1 tablet (2 mg total) by mouth at bedtime., Disp: 90 tablet, Rfl: 0 .  sertraline (ZOLOFT) 100 MG tablet, Take 2 tablets (200 mg total) by mouth at bedtime., Disp: 180 tablet, Rfl: 0 .  diazepam (VALIUM) 2 MG tablet, 2 po before flying prn., Disp: 4 tablet, Rfl: 0 Medication Side Effects: none  Family Medical/ Social History: Changes? No  MENTAL HEALTH EXAM:  There were no vitals taken for this visit.There is no height or weight on file to calculate BMI.  General Appearance: unable to assess  Eye Contact:  unable to assess  Speech:  Clear and Coherent  Volume:  Normal  Mood:  Euthymic  Affect:  unable to assess  Thought Process:  Goal Directed and Descriptions of Associations: Intact  Orientation:  Full (Time, Place, and Person)  Thought Content: Logical   Suicidal Thoughts:  No  Homicidal Thoughts:  No  Memory:  WNL  Judgement:  Good  Insight:  Good  Psychomotor Activity:  unable to assess  Concentration:  Concentration: Good  Recall:  Good  Fund of Knowledge: Good  Language: Good  Assets:  Desire for Improvement  ADL's:  Intact  Cognition: WNL  Prognosis:  Good    DIAGNOSES:    ICD-10-CM   1. Generalized anxiety disorder  F41.1 ALPRAZolam (XANAX) 1 MG tablet    risperiDONE (RISPERDAL) 2 MG tablet    sertraline (ZOLOFT) 100 MG tablet  2. Other insomnia  G47.09 mirtazapine (REMERON) 45 MG tablet    risperiDONE (RISPERDAL) 2 MG tablet  3. Major depressive disorder, recurrent episode, moderate (HCC)  F33.1 risperiDONE (RISPERDAL) 2 MG tablet    sertraline (ZOLOFT) 100 MG tablet  4. Insomnia, unspecified type  G47.00 risperiDONE (RISPERDAL) 2 MG tablet    Receiving Psychotherapy: No    RECOMMENDATIONS:  We discussed the BuSpar and how that can help prevent the  anxiety.  She is willing to start it.  I will leave it the same dose as was originally written. Start BuSpar 5 mg 1 3 times daily. Continue Xanax 1 mg, 1 p.o. every 6 hours as needed but take the least amount possible. Continue hydroxyzine 25 mg, 1 3 times daily as needed. Continue mirtazapine 45 mg, 1-1/2 p.o. nightly. Continue Risperdal 2 mg nightly. Continue Zoloft 100 mg, 2 p.o. daily. Start Valium 2 mg, 2 p.o. approximately 30 minutes before flying.  She knows not to mix that with the Xanax.  That should be taken at least 4 hours apart. Recommend therapy. Return in 3 months.  She does not want to be seen sooner but will call if there are problems.  Donnal Moat, PA-C

## 2019-02-23 ENCOUNTER — Telehealth: Payer: Self-pay | Admitting: Psychiatry

## 2019-02-23 NOTE — Telephone Encounter (Signed)
[facility] called to report that Chyanne Paddock was wanting to refill her alprazolam.  They showed that she had already picked it up, but she says she did not and doesn't have it.  So [facility] is requesting approval to ref[facility] her medication.  [facility] - [phone]

## 2019-02-24 ENCOUNTER — Other Ambulatory Visit: Payer: Self-pay | Admitting: Physician Assistant

## 2019-02-24 MED ORDER — ALPRAZOLAM 1 MG PO TABS: 1.0000 mg | ORAL_TABLET | Freq: Three times a day (TID) | ORAL | 0 refills | Status: DC | PRN

## 2019-02-24 NOTE — Telephone Encounter (Signed)
I reviewed PDMP which shows someone was given the Xanax that was prescribed in her name.  I will not give another Rx.

## 2019-02-24 NOTE — Telephone Encounter (Signed)
Please call her back and let her know we disc w/ Dr. Clovis Pu.  Because of camera footage at pharmacy and documentation on the controlled substance registry that the Rx was dispensed to her,I will be weaning her off this drug.  We will go down to 1 mg tid for 2 weeks, then decrease by 0.5mg  q 2 wks until off. I will [facility] in [facility] to [facility], and ask them to cancel all other RF she has.

## 2019-02-24 NOTE — Telephone Encounter (Signed)
[name] called about not getting her Xanax.  The pharmacy told her they will not fill it until January.  She needs to something to help her.  She is already starting withdrawal symptoms.  Please let her know what can be done.  She swears she didn't get the medication.

## 2019-02-24 NOTE — Telephone Encounter (Signed)
Left voice mail to call back 

## 2019-02-25 NOTE — Telephone Encounter (Signed)
Left another voice mail

## 2019-02-25 NOTE — Telephone Encounter (Signed)
Noted thank you

## 2019-02-25 NOTE — Telephone Encounter (Signed)
Do not schedule this patient with me.  I understand she objects to the plan to wean her off of alprazolam.  If she disagrees with the decision of this office about her alprazolam she is welcome to find another physician outside of this office.

## 2019-02-25 NOTE — Telephone Encounter (Signed)
Pt. Made aware.

## 2019-02-26 ENCOUNTER — Telehealth: Payer: Self-pay | Admitting: Cardiovascular Disease

## 2019-02-26 LAB — CUP PACEART REMOTE DEVICE CHECK
Battery Impedance: 257 Ohm
Battery Remaining Longevity: 129 mo
Battery Voltage: 2.78 V
Brady Statistic AP VP Percent: 0 %
Brady Statistic AP VS Percent: 0 %
Brady Statistic AS VP Percent: 2 %
Brady Statistic AS VS Percent: 98 %
Date Time Interrogation Session: 20201210090802
Implantable Lead Implant Date: 20141205
Implantable Lead Implant Date: 20141205
Implantable Lead Location: 753859
Implantable Lead Location: 753860
Implantable Lead Model: 5076
Implantable Lead Model: 5076
Implantable Pulse Generator Implant Date: 20141205
Lead Channel Impedance Value: 442 Ohm
Lead Channel Impedance Value: 486 Ohm
Lead Channel Pacing Threshold Amplitude: 0.5 V
Lead Channel Pacing Threshold Amplitude: 2.25 V
Lead Channel Pacing Threshold Pulse Width: 0.4 ms
Lead Channel Pacing Threshold Pulse Width: 0.4 ms
Lead Channel Setting Pacing Amplitude: 2 V
Lead Channel Setting Pacing Amplitude: 4.5 V
Lead Channel Setting Pacing Pulse Width: 0.4 ms
Lead Channel Setting Sensing Sensitivity: 2 mV

## 2019-02-26 NOTE — Telephone Encounter (Signed)
Pt c/o Syncope: STAT if syncope occurred within 30 minutes and pt complains of lightheadedness High Priority if episode of passing out, completely, today or in last 24 hours   1. Did you pass out today?   Yes today  2. When is the last time you passed out? today   3. Has this occurred multiple times? no   4. Did you have any symptoms prior to passing out?  No, just nervous

## 2019-02-26 NOTE — Telephone Encounter (Signed)
Sounds like another episode of neurally mediated (vasovagal) syncope as he has had previously. Remind her to eat a relatively salt-rich diet and drink lots of fluids. As soon as she feels the warning signs of an event of this type, lie down horizontally as soon as possible.

## 2019-02-26 NOTE — Telephone Encounter (Signed)
Spoke with pt and pt passed out for about a minute or two Per pt had prior warning felt shaky and sweaty and sat down on floor Pt does not know what B/P is and had no other symptoms Pt's device was checked today appears normal check Encouraged pt to contact PCP to see if could evaluate . Will forward to Dr Sallyanne Kuster for review and recommendations ./cy

## 2019-02-27 NOTE — Telephone Encounter (Signed)
Pt advised Dr. Victorino December recommendations.

## 2019-03-14 ENCOUNTER — Other Ambulatory Visit: Payer: Self-pay | Admitting: Adult Health

## 2019-03-14 DIAGNOSIS — F411 Generalized anxiety disorder: Secondary | ICD-10-CM

## 2019-03-16 ENCOUNTER — Other Ambulatory Visit: Payer: Self-pay | Admitting: *Deleted

## 2019-03-16 ENCOUNTER — Encounter: Payer: Self-pay | Admitting: *Deleted

## 2019-03-16 DIAGNOSIS — N289 Disorder of kidney and ureter, unspecified: Secondary | ICD-10-CM

## 2019-03-16 DIAGNOSIS — E785 Hyperlipidemia, unspecified: Secondary | ICD-10-CM

## 2019-03-17 ENCOUNTER — Telehealth: Payer: Self-pay | Admitting: Physician Assistant

## 2019-03-17 NOTE — Telephone Encounter (Signed)
Pt called to advise PA for Mirtazapine 45 mg    1.5 tablet at bedtime will expire 03/19/2019. Will need a need PA.

## 2019-03-22 NOTE — Telephone Encounter (Signed)
Prior authorization submitted for Mirtazapine 45 mg 1.5 tablets daily on 03/21/2019, pending response.

## 2019-03-25 ENCOUNTER — Telehealth: Payer: Self-pay

## 2019-03-25 NOTE — Telephone Encounter (Signed)
Prior authorization approved for quantity limit effective 03/20/2019-03/18/2020

## 2019-03-25 NOTE — Telephone Encounter (Signed)
Prior authorization submitted and approved through [facility] for quantity limit on Mirtazapine 45 mg, 1.5 tablets Q hs effective 03/20/2019-03/18/2020

## 2019-04-02 ENCOUNTER — Other Ambulatory Visit: Payer: Self-pay | Admitting: *Deleted

## 2019-04-02 DIAGNOSIS — Z87891 Personal history of nicotine dependence: Secondary | ICD-10-CM

## 2019-04-02 DIAGNOSIS — F17210 Nicotine dependence, cigarettes, uncomplicated: Secondary | ICD-10-CM

## 2019-04-08 ENCOUNTER — Other Ambulatory Visit: Payer: Self-pay | Admitting: Physician Assistant

## 2019-04-08 DIAGNOSIS — F411 Generalized anxiety disorder: Secondary | ICD-10-CM

## 2019-04-11 DIAGNOSIS — G894 Chronic pain syndrome: Secondary | ICD-10-CM | POA: Diagnosis not present

## 2019-04-11 DIAGNOSIS — R202 Paresthesia of skin: Secondary | ICD-10-CM | POA: Diagnosis not present

## 2019-04-11 DIAGNOSIS — M961 Postlaminectomy syndrome, not elsewhere classified: Secondary | ICD-10-CM | POA: Diagnosis not present

## 2019-04-11 DIAGNOSIS — M545 Low back pain: Secondary | ICD-10-CM | POA: Diagnosis not present

## 2019-04-17 DIAGNOSIS — R413 Other amnesia: Secondary | ICD-10-CM | POA: Diagnosis not present

## 2019-04-17 DIAGNOSIS — S0990XA Unspecified injury of head, initial encounter: Secondary | ICD-10-CM | POA: Diagnosis not present

## 2019-04-20 ENCOUNTER — Other Ambulatory Visit: Payer: Self-pay | Admitting: Physician Assistant

## 2019-04-20 ENCOUNTER — Other Ambulatory Visit: Payer: Self-pay

## 2019-04-20 ENCOUNTER — Ambulatory Visit (INDEPENDENT_AMBULATORY_CARE_PROVIDER_SITE_OTHER)
Admission: RE | Admit: 2019-04-20 | Discharge: 2019-04-20 | Disposition: A | Discharge status: Home / Self Care | Payer: Medicare HMO | Source: Ambulatory Visit | Attending: Acute Care | Admitting: Acute Care

## 2019-04-20 DIAGNOSIS — Z87891 Personal history of nicotine dependence: Secondary | ICD-10-CM

## 2019-04-20 DIAGNOSIS — G4709 Other insomnia: Secondary | ICD-10-CM

## 2019-04-20 DIAGNOSIS — F17210 Nicotine dependence, cigarettes, uncomplicated: Secondary | ICD-10-CM

## 2019-04-20 DIAGNOSIS — R69 Illness, unspecified: Secondary | ICD-10-CM | POA: Diagnosis not present

## 2019-04-20 NOTE — Progress Notes (Signed)
Please call patient and let them  know their  low dose Ct was read as a Lung RADS 2: nodules that are benign in appearance and behavior with a very low likelihood of becoming a clinically active cancer due to size or lack of growth. Recommendation per radiology is for a repeat LDCT in 12 months. .Please let them  know we will order and schedule their  annual screening scan for 04/2020. Please let them  know there was notation of CAD on their  scan.  Please remind the patient  that this is a non-gated exam therefore degree or severity of disease  cannot be determined. Please have them  follow up with their PCP regarding potential risk factor modification, dietary therapy or pharmacologic therapy if clinically indicated. Pt.  is  not currently on statin therapy. Please place order for annual  screening scan for  04/2020 and fax results to PCP. Thanks so much.

## 2019-04-21 ENCOUNTER — Other Ambulatory Visit: Payer: Self-pay | Admitting: *Deleted

## 2019-04-21 DIAGNOSIS — F17210 Nicotine dependence, cigarettes, uncomplicated: Secondary | ICD-10-CM

## 2019-04-21 DIAGNOSIS — Z87891 Personal history of nicotine dependence: Secondary | ICD-10-CM

## 2019-04-28 ENCOUNTER — Other Ambulatory Visit: Payer: Self-pay | Admitting: Family Medicine

## 2019-04-28 DIAGNOSIS — R413 Other amnesia: Secondary | ICD-10-CM

## 2019-04-29 ENCOUNTER — Other Ambulatory Visit (HOSPITAL_COMMUNITY): Payer: Self-pay | Admitting: Family Medicine

## 2019-04-29 DIAGNOSIS — R413 Other amnesia: Secondary | ICD-10-CM

## 2019-04-30 ENCOUNTER — Encounter (HOSPITAL_COMMUNITY): Payer: Self-pay

## 2019-04-30 NOTE — Progress Notes (Signed)
Per Charla [facility]  Ms. Jeneva Dragovich Maker is not conditional therefore she can not be scanned.

## 2019-05-08 DIAGNOSIS — M79605 Pain in left leg: Secondary | ICD-10-CM | POA: Diagnosis not present

## 2019-05-12 DIAGNOSIS — M5416 Radiculopathy, lumbar region: Secondary | ICD-10-CM | POA: Diagnosis not present

## 2019-05-12 DIAGNOSIS — R52 Pain, unspecified: Secondary | ICD-10-CM | POA: Diagnosis not present

## 2019-05-12 DIAGNOSIS — M25562 Pain in left knee: Secondary | ICD-10-CM | POA: Diagnosis not present

## 2019-05-17 ENCOUNTER — Other Ambulatory Visit: Payer: Self-pay | Admitting: Physician Assistant

## 2019-05-17 DIAGNOSIS — G4709 Other insomnia: Secondary | ICD-10-CM

## 2019-05-17 DIAGNOSIS — F331 Major depressive disorder, recurrent, moderate: Secondary | ICD-10-CM

## 2019-05-17 DIAGNOSIS — G4700 Insomnia, unspecified: Secondary | ICD-10-CM

## 2019-05-17 DIAGNOSIS — F411 Generalized anxiety disorder: Secondary | ICD-10-CM

## 2019-05-18 DIAGNOSIS — R109 Unspecified abdominal pain: Secondary | ICD-10-CM | POA: Diagnosis not present

## 2019-05-20 ENCOUNTER — Other Ambulatory Visit: Payer: Self-pay | Admitting: Family Medicine

## 2019-05-20 DIAGNOSIS — R413 Other amnesia: Secondary | ICD-10-CM

## 2019-05-21 ENCOUNTER — Encounter: Payer: Self-pay | Admitting: Physician Assistant

## 2019-05-21 ENCOUNTER — Other Ambulatory Visit: Payer: Self-pay

## 2019-05-21 ENCOUNTER — Ambulatory Visit (INDEPENDENT_AMBULATORY_CARE_PROVIDER_SITE_OTHER): Payer: Medicare HMO | Admitting: Physician Assistant

## 2019-05-21 DIAGNOSIS — F17200 Nicotine dependence, unspecified, uncomplicated: Secondary | ICD-10-CM

## 2019-05-21 DIAGNOSIS — F411 Generalized anxiety disorder: Secondary | ICD-10-CM | POA: Diagnosis not present

## 2019-05-21 DIAGNOSIS — F3341 Major depressive disorder, recurrent, in partial remission: Secondary | ICD-10-CM

## 2019-05-21 DIAGNOSIS — G4709 Other insomnia: Secondary | ICD-10-CM

## 2019-05-21 DIAGNOSIS — R69 Illness, unspecified: Secondary | ICD-10-CM | POA: Diagnosis not present

## 2019-05-21 MED ORDER — BUSPIRONE HCL 5 MG PO TABS: 5.0000 mg | ORAL_TABLET | Freq: Three times a day (TID) | ORAL | 0 refills | Status: DC

## 2019-05-21 MED ORDER — ALPRAZOLAM 1 MG PO TABS: 1.0000 mg | ORAL_TABLET | Freq: Four times a day (QID) | ORAL | 2 refills | Status: DC

## 2019-05-21 MED ORDER — MIRTAZAPINE 45 MG PO TABS: 67.0000 mg | ORAL_TABLET | Freq: Every day | ORAL | 0 refills | Status: DC

## 2019-05-21 NOTE — Progress Notes (Signed)
[facility] Check  Patient ID: [name],  MRN: [id number]  PCP: Pa, [place]  Date of Evaluation: 05/21/2019 Time spent:20 minutes  Chief Complaint:  Chief Complaint    Follow-up      HISTORY/CURRENT STATUS: HPI for routine med check.  At the last visit 3 months ago, we added BuSpar.  She states it has helped a lot with the generalized anxiety but she still has to have the Xanax during the day to help with panic.  She also has racing thoughts which are relieved by the Xanax.  States the depression is pretty well controlled with current medications.  She is able to enjoy some things.  She likes to watch TV.  Does not work.  Energy and motivation are fair to good, she does not cry easily.  She denies SI/HI.  Patient denies increased energy with decreased need for sleep, no increased talkativeness, no racing thoughts, no impulsivity or risky behaviors, no increased spending, no increased libido, no increased irritability.  No grandiosity.  No hallucinations.  Continues to smoke and states she is not ready to quit but she will let me know in the future if I can help her in any way.  Denies dizziness, syncope, seizures, numbness, tingling, tremor, tics, unsteady gait, slurred speech, confusion. Denies muscle or joint pain, stiffness, or dystonia.  Individual Medical History/ Review of Systems: Changes? :No    Past medications for mental health diagnoses include: Trazodone, Belsomra, Risperdal, Zoloft, hydroxyzine, mirtazapine, Xanax, Valium, Wellbutrin, Abilify, gabapentin, Strattera, Vyvanse, Ambien, Lunesta, Latuda  Allergies: Penicillins  Current Medications:  Current Outpatient Medications:  .  ALPRAZolam (XANAX) 1 MG tablet, Take 1 tablet (1 mg total) by mouth 4 (four) times daily., Disp: 120 tablet, Rfl: 2 .  busPIRone (BUSPAR) 5 MG tablet, Take 1 tablet (5 mg total) by mouth 3 (three) times daily., Disp: 270 tablet, Rfl: 0 .  cyclobenzaprine  (FLEXERIL) 10 MG tablet, Take 10 mg by mouth 2 (two) times daily as needed for muscle spasms., Disp: , Rfl:  .  hydrOXYzine (VISTARIL) 25 MG capsule, TAKE 1 CAPSULE (25 MG TOTAL) BY MOUTH 3 (THREE) TIMES DAILY AS NEEDED., Disp: 270 capsule, Rfl: 0 .  Melatonin 5 MG CAPS, Take 5 mg by mouth at bedtime as needed (sleep.). , Disp: , Rfl:  .  mirtazapine (REMERON) 45 MG tablet, Take 1.5 tablets (67 mg total) by mouth at bedtime., Disp: 135 tablet, Rfl: 0 .  omeprazole (PRILOSEC) 40 MG capsule, Take 40 mg by mouth at bedtime. , Disp: , Rfl: 5 .  risperiDONE (RISPERDAL) 2 MG tablet, TAKE 1 TABLET (2 MG TOTAL) BY MOUTH AT BEDTIME., Disp: 90 tablet, Rfl: 0 .  sertraline (ZOLOFT) 100 MG tablet, TAKE 2 TABLETS (200 MG TOTAL) BY MOUTH AT BEDTIME., Disp: 180 tablet, Rfl: 0 .  diazepam (VALIUM) 2 MG tablet, 2 po before flying prn. (Patient not taking: Reported on 05/21/2019), Disp: 4 tablet, Rfl: 0 .  ibuprofen (ADVIL) 600 MG tablet, Take 1 tablet (600 mg total) by mouth every 6 (six) hours as needed for moderate pain. For AFTER surgery only (Patient not taking: Reported on 05/21/2019), Disp: 30 tablet, Rfl: 0 .  oxyCODONE (OXY IR/ROXICODONE) 5 MG immediate release tablet, Take 1 tablet (5 mg total) by mouth every 4 (four) hours as needed for severe pain. For AFTER surgery, do not take and drive (Patient not taking: Reported on 05/21/2019), Disp: 10 tablet, Rfl: 0 Medication Side Effects: none  Family Medical/ Social  History: Changes? No  MENTAL HEALTH EXAM:  There were no vitals taken for this visit.There is no height or weight on file to calculate BMI.  General Appearance: Casual, Neat and Well Groomed  Eye Contact:  Good  Speech:  Clear and Coherent  Volume:  Normal  Mood:  Euthymic  Affect:  Appropriate  Thought Process:  Goal Directed and Descriptions of Associations: Intact  Orientation:  Full (Time, Place, and Person)  Thought Content: Logical   Suicidal Thoughts:  No  Homicidal Thoughts:  No   Memory:  WNL  Judgement:  Good  Insight:  Good  Psychomotor Activity:  Normal  Concentration:  Concentration: Good  Recall:  Good  Fund of Knowledge: Good  Language: Good  Assets:  Desire for Improvement  ADL's:  Intact  Cognition: WNL  Prognosis:  Good    DIAGNOSES:    ICD-10-CM   1. Recurrent major depressive disorder, in partial remission ([place])  F33.41   2. Generalized anxiety disorder  F41.1 ALPRAZolam (XANAX) 1 MG tablet    busPIRone (BUSPAR) 5 MG tablet  3. Other insomnia  G47.09 mirtazapine (REMERON) 45 MG tablet  4. Smoker  F17.200     Receiving Psychotherapy: No    RECOMMENDATIONS:  PDMP was reviewed. I spent 20 minutes with her. Briefly discussed smoking cessation.  She is not ready to quit.  I asked her to let me know if I can help her in any way in the future. Continue BuSpar 5 mg 1 3 times daily. Continue Xanax 1 mg, 1 p.o. every 6 hours as needed but take the least amount possible.  She is aware of long-term use at high doses possibly leading to dementia.  Also can increase the risk of falls.  She accepts those risks. Continue hydroxyzine 25 mg, 1 3 times daily as needed. Continue mirtazapine 45 mg, 1-1/2 p.o. nightly. Continue Risperdal 2 mg nightly. Continue Zoloft 100 mg, 2 p.o. daily. Start Valium 2 mg, 2 p.o. approximately 30 minutes before flying.  She knows not to mix that with the Xanax.  That should be taken at least 4 hours apart. Recommend therapy. Return in 3 months.    Donnal Moat, PA-C

## 2019-05-25 ENCOUNTER — Other Ambulatory Visit: Payer: Self-pay | Admitting: Chiropractic Medicine

## 2019-05-26 ENCOUNTER — Other Ambulatory Visit: Payer: Self-pay | Admitting: Chiropractic Medicine

## 2019-05-26 ENCOUNTER — Ambulatory Visit
Admission: RE | Admit: 2019-05-26 | Discharge: 2019-05-26 | Disposition: A | Discharge status: Home / Self Care | Payer: Medicare HMO | Source: Ambulatory Visit | Attending: Family Medicine | Admitting: Family Medicine

## 2019-05-26 ENCOUNTER — Encounter: Payer: Medicaid Other | Admitting: Neurology

## 2019-05-26 DIAGNOSIS — R413 Other amnesia: Secondary | ICD-10-CM

## 2019-05-26 DIAGNOSIS — Z9181 History of falling: Secondary | ICD-10-CM

## 2019-05-28 ENCOUNTER — Ambulatory Visit (INDEPENDENT_AMBULATORY_CARE_PROVIDER_SITE_OTHER): Payer: Medicare HMO | Admitting: *Deleted

## 2019-05-28 DIAGNOSIS — R55 Syncope and collapse: Secondary | ICD-10-CM

## 2019-05-28 LAB — CUP PACEART REMOTE DEVICE CHECK
Battery Impedance: 280 Ohm
Battery Remaining Longevity: 125 mo
Battery Voltage: 2.79 V
Brady Statistic AP VP Percent: 0 %
Brady Statistic AP VS Percent: 0 %
Brady Statistic AS VP Percent: 2 %
Brady Statistic AS VS Percent: 98 %
Date Time Interrogation Session: 20210311114511
Implantable Lead Implant Date: 20141205
Implantable Lead Implant Date: 20141205
Implantable Lead Location: 753859
Implantable Lead Location: 753860
Implantable Lead Model: 5076
Implantable Lead Model: 5076
Implantable Pulse Generator Implant Date: 20141205
Lead Channel Impedance Value: 469 Ohm
Lead Channel Impedance Value: 500 Ohm
Lead Channel Pacing Threshold Amplitude: 0.5 V
Lead Channel Pacing Threshold Amplitude: 0.875 V
Lead Channel Pacing Threshold Pulse Width: 0.4 ms
Lead Channel Pacing Threshold Pulse Width: 0.4 ms
Lead Channel Setting Pacing Amplitude: 2 V
Lead Channel Setting Pacing Amplitude: 2.5 V
Lead Channel Setting Pacing Pulse Width: 0.4 ms
Lead Channel Setting Sensing Sensitivity: 2 mV

## 2019-05-28 NOTE — Progress Notes (Signed)
PPM Remote  

## 2019-06-08 ENCOUNTER — Telehealth: Payer: Self-pay

## 2019-06-08 ENCOUNTER — Other Ambulatory Visit: Payer: Medicare HMO

## 2019-06-08 NOTE — Telephone Encounter (Signed)
I attempted to contact the patient twice to confirm. Patient did not answer either time and I was unable to LVM.

## 2019-06-08 NOTE — Telephone Encounter (Signed)
Called pt again and was able to get a hold of her and confirmed appt. Pt states she will be here.

## 2019-06-08 NOTE — Telephone Encounter (Signed)
Voicemail left at 1:22p:  Pt just wants a call back to confirm her appt tomorrow

## 2019-06-09 ENCOUNTER — Other Ambulatory Visit: Payer: Self-pay

## 2019-06-09 ENCOUNTER — Telehealth: Payer: Self-pay | Admitting: Neurology

## 2019-06-09 ENCOUNTER — Encounter: Payer: Medicare HMO | Admitting: Neurology

## 2019-06-09 ENCOUNTER — Ambulatory Visit (INDEPENDENT_AMBULATORY_CARE_PROVIDER_SITE_OTHER): Payer: Medicare HMO | Admitting: Neurology

## 2019-06-09 DIAGNOSIS — Z0289 Encounter for other administrative examinations: Secondary | ICD-10-CM

## 2019-06-09 DIAGNOSIS — R208 Other disturbances of skin sensation: Secondary | ICD-10-CM | POA: Diagnosis not present

## 2019-06-09 NOTE — Telephone Encounter (Signed)
Pt called to inform her results from today should be sent to [facility] to Dr.Ramos

## 2019-06-09 NOTE — Progress Notes (Addendum)
Full Name: [name] Gender: Female MRN #: [id number] Date of Birth: [birth year]    Visit Date: 06/09/2019 08:18 Age: 64 Years Examining Physician: Arlice Colt, MD  Referring Physician: Melina Schools, MD    History: [name] is a 64 year old woman with bilateral leg pain, mostly on the anterior aspect of the thighs.  On exam, strength and sensation was normal in both legs.  Nerve conduction studies: The left peroneal motor response was mildly delayed with a mildly reduced amplitude and mildly reduced conduction velocity.  The right peroneal and bilateral tibial motor responses had normal distal latencies, amplitudes and conduction velocities.  The tibial F-wave responses were normal.  Bilateral sural sensory responses had normal peak latencies and amplitudes.  Bilateral superficial peroneal sensory responses had normal peak latencies but mildly reduced amplitudes (could be normal for age)  Electromyography: Needle EMG of selected muscles of both legs was performed.  There was mild chronic denervation noted in several of the L5 innervated muscles bilaterally.  Other muscles tested were essentially normal.  There was no abnormal spontaneous activity.  Impression: This NCV/EMG study shows the following: 1.   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 2.   No evidence of polyneuropathy.  The mild left peroneal motor nerve abnormalities are most likely due to the L5 radiculopathy rather than a separate mononeuropathy.  [name] A. Felecia Shelling, MD, PhD, FAAN Certified in Neurology, Clinical Neurophysiology, Sleep Medicine, Pain Medicine and Neuroimaging Director, [place] at [facility] [address] [place], [place] [address] [phone]          [facility]    Nerve / Sites Muscle Latency Ref. Amplitude Ref. Rel Amp Segments Distance Velocity Ref. Area    ms ms mV mV %  cm m/s m/s  mVms  R Peroneal - EDB     Ankle EDB 6.5 ?6.5 4.1 ?2.0 100 Ankle - EDB 9   15.0     Fib head EDB 12.9  3.8  92.5 Fib head - Ankle 28 44 ?44 14.9     Pop fossa EDB 15.2  3.6  96.4 Pop fossa - Fib head 10 44 ?44 14.9         Pop fossa - Ankle      L Peroneal - EDB     Ankle EDB 7.1 ?6.5 1.5 ?2.0 100 Ankle - EDB 9   6.0     Fib head EDB 14.1  1.3  93 Fib head - Ankle 27 39 ?44 5.7     Pop fossa EDB 16.7  1.2  88.3 Pop fossa - Fib head 10 39 ?44 4.7         Pop fossa - Ankle      R Tibial - AH     Ankle AH 4.1 ?5.8 15.7 ?4.0 100 Ankle - AH 9   45.3     Pop fossa AH 13.5  11.8  75.1 Pop fossa - Ankle 38 41 ?41 39.2  L Tibial - AH     Ankle AH 4.0 ?5.8 13.3 ?4.0 100 Ankle - AH 9   41.2     Pop fossa AH 13.1  9.9  73.9 Pop fossa - Ankle 37 41 ?41 37.0             SNC    Nerve / Sites Rec. Site Peak Lat Ref.  Amp Ref. Segments Distance    ms ms V  V  cm  R Sural - Ankle (Calf)     Calf Ankle 4.1 ?4.4 7 ?6 Calf - Ankle 14  L Sural - Ankle (Calf)     Calf Ankle 4.1 ?4.4 8 ?6 Calf - Ankle 14  R Superficial peroneal - Ankle     Lat leg Ankle 4.4 ?4.4 3 ?6 Lat leg - Ankle 14  L Superficial peroneal - Ankle     Lat leg Ankle 3.9 ?4.4 3 ?6 Lat leg - Ankle 14             F  Wave    Nerve F Lat Ref.   ms ms  R Tibial - AH 54.8 ?56.0  L Tibial - AH 55.2 ?56.0         EMG Summary Table    Spontaneous MUAP Recruitment  Muscle IA Fib PSW Fasc Other Amp Dur. Poly Pattern  L. Vastus medialis Normal None None None _______ Normal Normal Normal Normal  L. Tibialis anterior Normal None None None _______ Normal Normal 1+ Reduced  L. Peroneus longus Normal None None None _______ Normal Normal 1+ Normal  L. Gastrocnemius (Medial head) Normal None None None _______ Normal Normal Normal Normal  L. Abductor hallucis Normal None None None _______ Normal Normal 1+ Normal  L. Gluteus medius Normal None None None _______ Normal Normal Normal Normal  L. Iliopsoas Normal None None None _______ Normal Normal  Normal Normal  R. Vastus medialis Normal None None None _______ Normal Normal Normal Normal  R. Peroneus longus Normal None None None _______ Normal Normal 1+ Reduced  R. Tibialis anterior Normal None None None _______ Normal Normal 1+ Reduced  R. Gastrocnemius (Medial head) Normal None None None _______ Normal Normal Normal Normal  R. Abductor hallucis Normal None None None _______ Normal Normal 1+ Normal  R. Gluteus medius Normal None None None _______ Normal Normal Normal Normal  R. Iliopsoas Normal None None None _______ Normal Normal Normal Normal

## 2019-06-09 NOTE — Telephone Encounter (Signed)
Debra, can you help with this? Thank you 

## 2019-06-23 DIAGNOSIS — R42 Dizziness and giddiness: Secondary | ICD-10-CM | POA: Diagnosis not present

## 2019-06-23 DIAGNOSIS — R110 Nausea: Secondary | ICD-10-CM | POA: Diagnosis not present

## 2019-06-26 DIAGNOSIS — H9312 Tinnitus, left ear: Secondary | ICD-10-CM | POA: Diagnosis not present

## 2019-06-26 DIAGNOSIS — E538 Deficiency of other specified B group vitamins: Secondary | ICD-10-CM | POA: Diagnosis not present

## 2019-06-26 DIAGNOSIS — H9192 Unspecified hearing loss, left ear: Secondary | ICD-10-CM | POA: Diagnosis not present

## 2019-06-26 DIAGNOSIS — Z131 Encounter for screening for diabetes mellitus: Secondary | ICD-10-CM | POA: Diagnosis not present

## 2019-06-26 DIAGNOSIS — R42 Dizziness and giddiness: Secondary | ICD-10-CM | POA: Diagnosis not present

## 2019-06-26 DIAGNOSIS — Z130 Encounter for screening for diseases of the blood and blood-forming organs and certain disorders involving the immune mechanism: Secondary | ICD-10-CM | POA: Diagnosis not present

## 2019-06-29 ENCOUNTER — Telehealth: Payer: Self-pay | Admitting: *Deleted

## 2019-06-29 NOTE — Telephone Encounter (Signed)
Called pt to r/s her appt at 1130am tomorrow with Dr. Felecia Shelling. He has a meeting.Her mailbox was full, could not leave message.   If she calls, please offer her 07/02/19 at 9:30am or 07/06/19 at 2:30pm

## 2019-06-30 ENCOUNTER — Institutional Professional Consult (permissible substitution): Payer: Medicare HMO | Admitting: Neurology

## 2019-07-02 ENCOUNTER — Other Ambulatory Visit: Payer: Self-pay

## 2019-07-02 ENCOUNTER — Encounter: Payer: Self-pay | Admitting: Neurology

## 2019-07-02 ENCOUNTER — Ambulatory Visit: Payer: Medicare HMO | Admitting: Neurology

## 2019-07-02 VITALS — BP 98/70 | HR 83 | Temp 98.4°F | Ht 67.0 in | Wt 131.5 lb

## 2019-07-02 DIAGNOSIS — E538 Deficiency of other specified B group vitamins: Secondary | ICD-10-CM

## 2019-07-02 DIAGNOSIS — M5416 Radiculopathy, lumbar region: Secondary | ICD-10-CM | POA: Diagnosis not present

## 2019-07-02 DIAGNOSIS — R413 Other amnesia: Secondary | ICD-10-CM | POA: Diagnosis not present

## 2019-07-02 DIAGNOSIS — R69 Illness, unspecified: Secondary | ICD-10-CM | POA: Diagnosis not present

## 2019-07-02 DIAGNOSIS — F418 Other specified anxiety disorders: Secondary | ICD-10-CM

## 2019-07-02 DIAGNOSIS — R42 Dizziness and giddiness: Secondary | ICD-10-CM

## 2019-07-02 NOTE — Progress Notes (Signed)
[facility]  PATIENT: JACKSON BLAZER DOB: 04/30/1955  REFERRING DOCTOR OR PCP: Dr. Lujean Amel SOURCE: Patient, notes from Dr. Rolena Infante, imaging reports and lumbar MRI and CT images personally reviewed.   _________________________________   HISTORICAL  CHIEF COMPLAINT:  Chief Complaint  Patient presents with  . New Patient (Initial Visit)    RM 13, alone. Paper referral from PCP for memory issues. MOCA today: 21/30.     HISTORY OF PRESENT ILLNESS:  Amena Theesfeld who I have previously seen for lumbar spine issues now returning for difficulties with memory.  She has been more forgetful over the past year.   She often walks into a room and does not recall why she entered.   She denies issues remembering names or appointments.  Once she left her car running an another time left the stove on.  I reviewed her brain CT scan.  She has mild generalized cortical atrophy, most involving the parietal lobes.  The medial temporal lobes do not appear to be atrophied.  There were no acute findings.  Recent blood work showed normal TSH but her B12 was low at 165.  She reports that is being supplemented now.  During the MoCA test today she scored 2/5 for short term recall --- however with category hints, she was 5/5.  She has new onset vertigo and feels the left ear is not hearing as well as the right.  This started about a week ago.  The addition of meclizine did not help any.  She reports that she has an appointment to see ENT next week.  She takes alprazolam 1 mg 4 times a day, mirtazepine 67 mg qHS, flexeril 10 mg 1-2 times daily, risperdal 2 mg qHS, promethazine 25 mg tid.   Less likely to affect cognitin is on zoloft 200 mg qHS and buspar 5 mg  She feels her sleep is worse some nights.    She sleeps 6-7 hours most nights.     She does not snore or have OSA symptoms.  She has had mood issues but feels mood is generally doing better now.   I have previously seen her for back  pain and she reports that it is doing better the last year.   Montreal Cognitive Assessment  07/02/2019  Visuospatial/ Executive (0/5) 2  Naming (0/3) 2  Attention: Read list of digits (0/2) 2  Attention: Read list of letters (0/1) 1  Attention: Serial 7 subtraction starting at 100 (0/3) 2  Language: Repeat phrase (0/2) 2  Language : Fluency (0/1) 0  Abstraction (0/2) 2  Delayed Recall (0/5) 2  Orientation (0/6) 5  Total 20  Adjusted Score (based on education) 21    Data reviewed: CT scan of the head 05/26/2019: There is mild cortical atrophy, most pronounced in the parietal lobes.  Brain parenchyma appeared normal.  There were no acute findings.  CT scan lumbar spine 01/06/2019 IMPRESSION:  1. Prior fusion and posterior decompression at the L4-5 level with incorporation within the intervertebral disc spacer, however minimal incorporation across the remaining endplates at this level. Hardware is intact without evidence of loosening.  2. Probable mild canal stenosis at the L3-4 level.  3. L4-5 far lateral disc material appears to contact the exited left L4 nerve root.  4. Central disc protrusion at L5-S1 without evidence of canal stenosis.  MRI of the lumbar spine 09/11/2005 showed mild spinal stenosis at L4-L5 due to disc bulging, facet hypertrophy and ligamenta flava hypertrophy.  There  was lateral recess stenosis.  L5-S1 showed facet hypertrophy but no spinal stenosis or foraminal narrowing.  Other levels appeared normal.  REVIEW OF SYSTEMS: Constitutional: No fevers, chills, sweats, or change in appetite Eyes: No visual changes, double vision, eye pain Ear, nose and throat: No hearing loss, ear pain, nasal congestion, sore throat Cardiovascular: No chest pain, palpitations Respiratory: No shortness of breath at rest or with exertion.   No wheezes GastrointestinaI: No nausea, vomiting, diarrhea, abdominal pain, fecal incontinence Genitourinary: No dysuria, urinary retention or  frequency.  No nocturia. Musculoskeletal:As above  integumentary: No rash, pruritus, skin lesions Neurological: as above Psychiatric: No depression at this time.  No anxiety Endocrine: No palpitations, diaphoresis, change in appetite, change in weigh or increased thirst Hematologic/Lymphatic: No anemia, purpura, petechiae. Allergic/Immunologic: No itchy/runny eyes, nasal congestion, recent allergic reactions, rashes  ALLERGIES: Allergies  Allergen Reactions  . Penicillins Other (See Comments)    Yeast infection Did it involve swelling of the face/tongue/throat, SOB, or low BP? No Did it involve sudden or severe rash/hives, skin peeling, or any reaction on the inside of your mouth or nose? No Did you need to seek medical attention at a hospital or doctor's office? Yes When did it last happen? More than 5 years ago If all above answers are "NO", may proceed with cephalosporin use.     HOME MEDICATIONS:  Current Outpatient Medications:  .  ALPRAZolam (XANAX) 1 MG tablet, Take 1 tablet (1 mg total) by mouth 4 (four) times daily., Disp: 120 tablet, Rfl: 2 .  busPIRone (BUSPAR) 5 MG tablet, Take 1 tablet (5 mg total) by mouth 3 (three) times daily., Disp: 270 tablet, Rfl: 0 .  cyclobenzaprine (FLEXERIL) 10 MG tablet, Take 10 mg by mouth 2 (two) times daily as needed for muscle spasms., Disp: , Rfl:  .  hydrOXYzine (VISTARIL) 25 MG capsule, TAKE 1 CAPSULE (25 MG TOTAL) BY MOUTH 3 (THREE) TIMES DAILY AS NEEDED., Disp: 270 capsule, Rfl: 0 .  ibuprofen (ADVIL) 600 MG tablet, Take 1 tablet (600 mg total) by mouth every 6 (six) hours as needed for moderate pain. For AFTER surgery only, Disp: 30 tablet, Rfl: 0 .  meclizine (ANTIVERT) 25 MG tablet, Take 25 mg by mouth 3 (three) times daily as needed for dizziness., Disp: , Rfl:  .  Melatonin 5 MG CAPS, Take 5 mg by mouth at bedtime as needed (sleep.). , Disp: , Rfl:  .  mirtazapine (REMERON) 45 MG tablet, Take 1.5 tablets (67 mg total) by mouth  at bedtime., Disp: 135 tablet, Rfl: 0 .  omeprazole (PRILOSEC) 40 MG capsule, Take 40 mg by mouth at bedtime. , Disp: , Rfl: 5 .  predniSONE (DELTASONE) 20 MG tablet, Take 60 mg by mouth daily with breakfast., Disp: , Rfl:  .  promethazine (PHENERGAN) 25 MG tablet, Take 25 mg by mouth every 6 (six) hours as needed for nausea or vomiting., Disp: , Rfl:  .  risperiDONE (RISPERDAL) 2 MG tablet, TAKE 1 TABLET (2 MG TOTAL) BY MOUTH AT BEDTIME., Disp: 90 tablet, Rfl: 0 .  sertraline (ZOLOFT) 100 MG tablet, TAKE 2 TABLETS (200 MG TOTAL) BY MOUTH AT BEDTIME., Disp: 180 tablet, Rfl: 0 .  diazepam (VALIUM) 2 MG tablet, 2 po before flying prn. (Patient not taking: Reported on 07/02/2019), Disp: 4 tablet, Rfl: 0  PAST MEDICAL HISTORY: Past Medical History:  Diagnosis Date  . Anxiety    anxiety and mild depressive order systoms  . Bipolar disorder ([place])     (  02/20/2013)  . Cervical cancer ([place]) 03/19/1980  . Depression   . Dysrhythmia   . [id number])    "monthly" (02/20/2013)  . Pacemaker    medtronic  . PONV (postoperative nausea and vomiting)   . Sinus pause 02/14/2013  . Syncope and collapse    echo-April 12,2012-EF 55% Echo normal; recorder with prolonged sinus pauses --> status post  . Tobacco abuse   . Vertigo     PAST SURGICAL HISTORY: Past Surgical History:  Procedure Laterality Date  . ABDOMINAL HYSTERECTOMY  03/19/1980   "partial"  . BACK SURGERY    . CERVICAL FUSION Left 11/2013   C4-C6  . CERVICAL FUSION     3,4,5,  . INSERT / REPLACE / REMOVE PACEMAKER  02/20/2013   medtronic  . LOOP RECORDER IMPLANT N/A 11/06/2012   Procedure: LINQ LOOP RECORDER IMPLANT;  Surgeon: Sanda Klein, MD;  Location: [facility];  Service: Cardiovascular;  Laterality: N/A;  . NECK SURGERY  11/17/2013  . PACEMAKER PLACEMENT N/A 02/16/2013  . PERMANENT PACEMAKER INSERTION N/A 02/20/2013   Procedure: PERMANENT PACEMAKER INSERTION;  Surgeon: Sanda Klein, MD;  Location: [facility];  Service:  Cardiovascular;  Laterality: N/A;  . POSTERIOR LUMBAR FUSION  ~ 2009  . ROBOTIC ASSISTED SALPINGO OOPHERECTOMY Bilateral 11/25/2018   Procedure: XI ROBOTIC ASSISTED BILATERAL SALPINGO OOPHORECTOMY and LYSIS OF ADHESIONS.;  Surgeon: Everitt Amber, MD;  Location: [facility];  Service: Gynecology;  Laterality: Bilateral;  . SPINAL FUSION  03/19/2014  . TONSILLECTOMY    . TRANSTHORACIC ECHOCARDIOGRAM  07/11/2010   The left atrial size is normal.There is no evidence of mitral vavle prolaspe.Right ventriicular systolic pressure is normal . Injection of contrast documented no interatrial shunt. Essentially normal 2D echo -doppler study     FAMILY HISTORY: Family History  Adopted: Yes  Problem Relation Age of Onset  . Heart attack Brother     SOCIAL HISTORY:  Social History   Socioeconomic History  . Marital status: Widowed    Spouse name: Not on file  . Number of children: Not on file  . Years of education: 38  . Highest education level: Not on file  Occupational History  . Not on file  Tobacco Use  . Smoking status: Current Every Day Smoker    Packs/day: 0.75    Years: 40.00    Pack years: 30.00    Types: Cigarettes  . Smokeless tobacco: Never Used  . Tobacco comment: 14 cigarette per day  Substance and Sexual Activity  . Alcohol use: No  . Drug use: No  . Sexual activity: Yes  Other Topics Concern  . Not on file  Social History Narrative   Married 29 years . Has 3 children. Current smoker -1 pack a day-for 36 years .Alcohol :  1 beer on occasion.   She is soon to be a grandmother.  His before to going on a trip up to the [place] to be closer to family when the baby is born.  She is extremely excited.   She very much would like to move back up to that area to be closer to family, but the whether is it just too cold in the winter.   Right handed    Soda daily   Lives alone   Social Determinants of Health   Financial Resource Strain:   . Difficulty of Paying  Living Expenses:   Food Insecurity:   . Worried About Charity fundraiser in the Last Year:   .  Ran Out of Food in the Last Year:   Transportation Needs:   . Film/video editor (Medical):   Marland Kitchen Lack of Transportation (Non-Medical):   Physical Activity:   . Days of Exercise per Week:   . Minutes of Exercise per Session:   Stress:   . Feeling of Stress :   Social Connections:   . Frequency of Communication with Friends and Family:   . Frequency of Social Gatherings with Friends and Family:   . Attends Religious Services:   . Active Member of Clubs or Organizations:   . Attends Archivist Meetings:   Marland Kitchen Marital Status:   Intimate Partner Violence:   . Fear of Current or Ex-Partner:   . Emotionally Abused:   Marland Kitchen Physically Abused:   . Sexually Abused:      PHYSICAL EXAM  Vitals:   07/02/19 0920  BP: 98/70  Pulse: 83  Temp: 98.4 F (36.9 C)  SpO2: 98%  Weight: 131 lb 8 oz (59.6 kg)  Height: 5\' 7"  (1.702 m)    Body mass index is 20.6 kg/m.   General: The patient is well-developed and well-nourished and in no acute distress  HEENT:  Head is [place]/AT.  Sclera are anicteric.  Some cerumen butno occulsion of ear canals.  TMI   Neck:The neck is nontender with good range of motion   Skin: Extremities are without rash or  edema.  Musculoskeletal:  Back is mildly tender in the lower lumbar paraspinal region  Neurologic Exam  Mental status: See MoCA scores above.  Of note STM was 2/5 without category prompt but 5/5 with category prompt.  Cranial nerves: Extraocular movements are full. There is good facial sensation to soft touch bilaterally.Facial strength is normal. No obvious hearing deficits are noted.  Motor:  Muscle bulk is normal.   Tone is normal. Strength is  5 / 5 in all 4 extremities except 4+/5 right EHL.     Sensory: Sensory testing showed reduced touch sensation in the right leg (anterolateral lower thigh to shin.   Feet and medial aspect of legs had  symmetric sensation.   symmetric vibration in the legs.    Coordination: Cerebellar testing reveals good finger-nose-finger and heel-to-shin bilaterally.  Gait and station: Station is normal.   Gait is mildly wide an she has difficulty with tandem gait.  . Romberg is negative.   Reflexes: Deep tendon reflexes are symmetric and normal bilaterally.    Maneuvers: She did not have any nystagmus the reported mild vertigo with the Dix-Hallpike maneuvers to either side.    DIAGNOSTIC DATA (LABS, IMAGING, TESTING) - I reviewed patient records, labs, notes, testing and imaging myself where available.  Lab Results  Component Value Date   WBC 21.7 (H) 11/21/2018   HGB 14.7 11/21/2018   HCT 46.7 (H) 11/21/2018   MCV 92.3 11/21/2018   PLT 354 11/21/2018      Component Value Date/Time   NA 141 11/21/2018 1017   K 3.7 11/21/2018 1017   CL 107 11/21/2018 1017   CO2 24 11/21/2018 1017   GLUCOSE 122 (H) 11/21/2018 1017   BUN 10 11/21/2018 1017   CREATININE 0.84 11/21/2018 1017   CREATININE 1.08 (H) 09/24/2018 1019   CALCIUM 9.3 11/21/2018 1017   PROT 7.7 11/21/2018 1017   ALBUMIN 4.4 11/21/2018 1017   AST 20 11/21/2018 1017   ALT 22 11/21/2018 1017   ALKPHOS 70 11/21/2018 1017   BILITOT 0.6 11/21/2018 1017   GFRNONAA >60 11/21/2018 1017  GFRNONAA 56 (L) 09/24/2018 1019   GFRAA >60 11/21/2018 1017   GFRAA 65 09/24/2018 1019   Lab Results  Component Value Date   CHOL 256 (H) 09/24/2018   HDL 50 09/24/2018   LDLCALC 179 (H) 09/24/2018   TRIG 130 09/24/2018   CHOLHDL 5.1 (H) 09/24/2018       ASSESSMENT AND PLAN  Memory loss  Vertigo  Depression with anxiety  Lumbar radiculopathy  B12 deficiency  In summary, Ms. Charisse March is a 65 year old woman reporting worsening memory over the last year.  Additionally she reports vertigo for the past week. Her MoCA pattern suggests at least part of her cognitive decline is due to reduced focus/attention.   Her recall improved to 5/5  with hints.    That, combined with no temporal lobe atrophy on CT makes Alzheimer's unlikely.     At her age, the most common causes of memory loss, especially when memory is fairly normal with hints, is reduced focus and attention.  This could be due to either medications, poor sleep, mood disturbance or other medical issues.    Her medications may be playing some role in the reduced focus/attention  ---- she will try to cut alprazolam to 2 mg daily from 4 mg.     She also feels she gets no benefit from phenergan and meclizine and I will advise her to stop these.  Hopefully, she will feel more alert with these medication changes.   Could also consider a stimulant which may help focus some.   Her vitamin B12 was low which could be playing some role.  She reports that it has been supplemented now  She will be seeing ENT for the vertigo.  Etiology unclear but she feels the reduced hearing was concurrent.   Schwannoma unlikely as onset was overnight.   She did not have nystagmus or extreme vertigo with Dix-Hallpike maneuvers making benign positional vertigo unlikely.   If not better may need repeat CT scan to rule out mastoid of posterior fossa process.     She can't have MRI's due to an incompatible pacemaker   She will return in 6 months or sooner for a reevaluation.  Thank you for asking me to see Ms. Bencomo.  Please let me know when we have further assistance with her or other patients in the future.   Kenidy Crossland A. Felecia Shelling, MD, [facility] [id number], [id number] PM Certified in Neurology, Clinical Neurophysiology, Sleep Medicine and Neuroimaging  [facility] [address] [place], [place] [address] [phone]

## 2019-07-06 DIAGNOSIS — Z20828 Contact with and (suspected) exposure to other viral communicable diseases: Secondary | ICD-10-CM | POA: Diagnosis not present

## 2019-07-06 DIAGNOSIS — Z03818 Encounter for observation for suspected exposure to other biological agents ruled out: Secondary | ICD-10-CM | POA: Diagnosis not present

## 2019-07-08 ENCOUNTER — Telehealth: Payer: Self-pay | Admitting: Physician Assistant

## 2019-07-08 DIAGNOSIS — R6883 Chills (without fever): Secondary | ICD-10-CM | POA: Diagnosis not present

## 2019-07-08 NOTE — Telephone Encounter (Signed)
Pt called and stated that her PCP and Neurologist feel that she is on too many medications that have been prescribed by T. Hurst. Please reach out to pt if you have questions [phone]

## 2019-07-09 NOTE — Telephone Encounter (Signed)
Have her make an in-office appt w/ me to discuss.  Thanks

## 2019-07-09 NOTE — Telephone Encounter (Signed)
It would be great to get off some of these meds, I agree! Let's start by weaning Xanax.  She's on 1 mg and takes 4 times daily.  From now on, have her take 1 pill 3 times a day with an extra 1/2 pill during the day as needed.  She should take that for 2 full weeks and then decrease to 1 pill 3 times daily until I see her next.Next, let us decrease the mirtazapine 45 mg, she is currently taking 1.5 pills, have her go down to 1 pill p.o. nightly until I see her.Have her make an appointment with me prior to 08/21/2019 so we can further discuss.Thank you.

## 2019-07-09 NOTE — Telephone Encounter (Signed)
Noted  

## 2019-07-09 NOTE — Telephone Encounter (Signed)
I have called and scheduled her an appt w/ you on 07/13/2019.

## 2019-07-13 ENCOUNTER — Ambulatory Visit: Payer: Medicaid Other | Admitting: Physician Assistant

## 2019-07-14 ENCOUNTER — Other Ambulatory Visit: Payer: Self-pay | Admitting: Physician Assistant

## 2019-07-14 DIAGNOSIS — G4709 Other insomnia: Secondary | ICD-10-CM

## 2019-07-14 NOTE — Telephone Encounter (Signed)
She has apt tomorrow 04/28

## 2019-07-15 ENCOUNTER — Ambulatory Visit (INDEPENDENT_AMBULATORY_CARE_PROVIDER_SITE_OTHER): Payer: Medicare HMO | Admitting: Physician Assistant

## 2019-07-15 ENCOUNTER — Encounter: Payer: Self-pay | Admitting: Physician Assistant

## 2019-07-15 ENCOUNTER — Other Ambulatory Visit: Payer: Self-pay

## 2019-07-15 DIAGNOSIS — F3341 Major depressive disorder, recurrent, in partial remission: Secondary | ICD-10-CM | POA: Diagnosis not present

## 2019-07-15 DIAGNOSIS — R413 Other amnesia: Secondary | ICD-10-CM

## 2019-07-15 DIAGNOSIS — G4700 Insomnia, unspecified: Secondary | ICD-10-CM | POA: Diagnosis not present

## 2019-07-15 DIAGNOSIS — F39 Unspecified mood [affective] disorder: Secondary | ICD-10-CM | POA: Diagnosis not present

## 2019-07-15 DIAGNOSIS — Z79899 Other long term (current) drug therapy: Secondary | ICD-10-CM | POA: Diagnosis not present

## 2019-07-15 DIAGNOSIS — R69 Illness, unspecified: Secondary | ICD-10-CM | POA: Diagnosis not present

## 2019-07-15 DIAGNOSIS — F17200 Nicotine dependence, unspecified, uncomplicated: Secondary | ICD-10-CM

## 2019-07-15 DIAGNOSIS — F411 Generalized anxiety disorder: Secondary | ICD-10-CM | POA: Diagnosis not present

## 2019-07-15 MED ORDER — ALPRAZOLAM 1 MG PO TABS: ORAL_TABLET | ORAL | 0 refills | Status: DC

## 2019-07-15 MED ORDER — BUSPIRONE HCL 10 MG PO TABS: 10.0000 mg | ORAL_TABLET | Freq: Three times a day (TID) | ORAL | 1 refills | Status: DC

## 2019-07-15 NOTE — Progress Notes (Addendum)
[facility]  Patient ID: [name],  MRN: [id number]  PCP: Pa, Advance  Date of Evaluation: 07/15/2019 Time spent:45 minutes  Chief Complaint:  Chief Complaint    Anxiety; Medication Problem      HISTORY/CURRENT STATUS: HPI to discuss her psychiatric medications.  Patient called on 07/08/2019 stating that her PCP and neurologist feel that she is on too many medications that have been prescribed by me. (Of course I do not really know what was said in the conversation.)  I agree that she is on a lot of medications, most are for her mental health.  I recommended that we start decreasing the Xanax because she is on a high dose, and decrease the mirtazapine, as that is a high dose when used for sleep.  Our nurse relayed that message to her and the patient told her that she cannot come off of the Xanax because she has too much anxiety and cannot get off the mirtazapine because it helps her sleep.  She thinks it should be risperidone and sertraline.  At that point, I had the patient make an appointment for [place] to discuss her medications.  Patient presents today telling me what her doctors said, as noted above.  Patient states she is not depressed and asked why she is on an antidepressant if she is not depressed.  She feels like the doctors wanted her to stay on Xanax and get off of the Zoloft and Risperdal.  Patient has severe anxiety and has had for many years.  States she is unable to decrease the Xanax.  She then told me that she does not take the Xanax 4 times a day, in fact she does not even take it some days.  She denies symptoms of depression.  Says she is able to enjoy things.  Energy and motivation are stable for her, she does not want to stay in bed all the time, she does not cry easily, no suicidal or homicidal thoughts.  States she has trouble sleeping, falling asleep and staying asleep, if she does not take the mirtazapine.  She has been on that  for an unknown period of time and states she has gradually increased the dose over time and that nothing else has worked.  She feels rested when she gets up.  Denies dizziness, syncope, seizures, numbness, tingling, tremor, tics, unsteady gait, slurred speech, confusion.  She does report memory issues, and she has seen neurologist Arlice Colt, MD for that.  Denies muscle or joint pain, stiffness, or dystonia.  Individual Medical History/ Review of Systems: Changes? :No    Past medications for mental health diagnoses include: Trazodone, Belsomra, Risperdal, Zoloft, hydroxyzine, mirtazapine, Xanax, Valium, Wellbutrin, Abilify, gabapentin, Strattera, Vyvanse, Ambien, Lunesta, Latuda  Allergies: Penicillins  Current Medications:  Current Outpatient Medications:  .  cyclobenzaprine (FLEXERIL) 10 MG tablet, Take 10 mg by mouth 2 (two) times daily as needed for muscle spasms., Disp: , Rfl:  .  hydrOXYzine (VISTARIL) 25 MG capsule, TAKE 1 CAPSULE (25 MG TOTAL) BY MOUTH 3 (THREE) TIMES DAILY AS NEEDED., Disp: 270 capsule, Rfl: 0 .  ibuprofen (ADVIL) 600 MG tablet, Take 1 tablet (600 mg total) by mouth every 6 (six) hours as needed for moderate pain. For AFTER surgery only, Disp: 30 tablet, Rfl: 0 .  Melatonin 5 MG CAPS, Take 5 mg by mouth at bedtime as needed (sleep.). , Disp: , Rfl:  .  mirtazapine (REMERON) 45 MG tablet, Take 1.5 tablets (67 mg  total) by mouth at bedtime., Disp: 135 tablet, Rfl: 0 .  omeprazole (PRILOSEC) 40 MG capsule, Take 40 mg by mouth at bedtime. , Disp: , Rfl: 5 .  risperiDONE (RISPERDAL) 2 MG tablet, TAKE 1 TABLET (2 MG TOTAL) BY MOUTH AT BEDTIME., Disp: 90 tablet, Rfl: 0 .  sertraline (ZOLOFT) 100 MG tablet, TAKE 2 TABLETS (200 MG TOTAL) BY MOUTH AT BEDTIME., Disp: 180 tablet, Rfl: 0 .  thiamine (VITAMIN B-1) 100 MG tablet, Take 100 mg by mouth daily., Disp: , Rfl:  .  ALPRAZolam (XANAX) 1 MG tablet, 1 po tid prn and 1/2 po mid afternoon prn. No more than 3.5 pills per day.,  Disp: 105 tablet, Rfl: 0 .  busPIRone (BUSPAR) 10 MG tablet, Take 1 tablet (10 mg total) by mouth 3 (three) times daily., Disp: 90 tablet, Rfl: 1 .  diazepam (VALIUM) 2 MG tablet, 2 po before flying prn. (Patient not taking: Reported on 07/02/2019), Disp: 4 tablet, Rfl: 0 .  meclizine (ANTIVERT) 25 MG tablet, Take 25 mg by mouth 3 (three) times daily as needed for dizziness., Disp: , Rfl:  .  predniSONE (DELTASONE) 20 MG tablet, Take 60 mg by mouth daily with breakfast., Disp: , Rfl:  .  promethazine (PHENERGAN) 25 MG tablet, Take 25 mg by mouth every 6 (six) hours as needed for nausea or vomiting., Disp: , Rfl:  Medication Side Effects: none  Family Medical/ Social History: Changes? No  MENTAL HEALTH EXAM:  There were no vitals taken for this visit.There is no height or weight on file to calculate BMI.  General Appearance: Casual, Neat and Well Groomed  Eye Contact:  Good  Speech:  Clear and Coherent and Normal Rate  Volume:  Normal  Mood:  Irritable  Affect:  Appropriate  Thought Process:  Goal Directed and Descriptions of Associations: Intact  Orientation:  Full (Time, Place, and Person)  Thought Content: Logical   Suicidal Thoughts:  No  Homicidal Thoughts:  No  Memory:  WNL  Judgement:  Good  Insight:  Good  Psychomotor Activity:  Normal  Concentration:  Concentration: Good  Recall:  Good  Fund of Knowledge: Good  Language: Good  Assets:  Desire for Improvement  ADL's:  Intact  Cognition: WNL  Prognosis:  Fair    DIAGNOSES:    ICD-10-CM   1. Generalized anxiety disorder  F41.1   2. Episodic mood disorder (HCC)  F39   3. Recurrent major depressive disorder, in partial remission ([place])  F33.41   4. Insomnia, unspecified type  G47.00   5. Smoker  F17.200   6. Memory change  R41.3   7. Polypharmacy  Z79.899     Receiving Psychotherapy: No    RECOMMENDATIONS:  I am unable to review PDMP, epic shows there is no response.  I spent 45 minutes with her.  I agree that  she is on a lot of medication, the majority being psych meds.  She had been the patient of my colleague, Comer Locket, [place] who passed away last year, and began seeing me at that time.  I am not making excuses but because she has been stable on these medications and at these doses, I continued the treatment plan.  Patient and I discussed all of her mental health medications and the purposes of those psych meds.  BuSpar is to help prevent anxiety and she is at a very low dose so that needs to be increased.  We will gradually decrease the Xanax over time and  if possible, get her off of it, but if not we will at least have tried and I believe she can be at a lower dose that will help the anxiety.  As far as her comment about not taking the Xanax every day, I asked her why she was needing 120 pills every month.  She did not answer.  Hydroxyzine is an antihistamine and we use it for anxiety, especially panic attacks.  However that can also cause drowsiness and slowing of thinking.  In my opinion it is better than the Xanax though which has the same side effects.  The mirtazapine is for sleep but typically works better at a lower dose when prescribed for sleep only.  Eventually I would like to wean her down at least to 30 mg and may be lower, 7.5 mg to 15 mg is usually the maximum for sleep.  Risperdal is a mood stabilizer AP that also helps mood.  It can cause daytime drowsiness as well.  Zoloft is classified as an antidepressant but it also helps with anxiety.   During this discussion, she was hostile and irritable, saying "I do not think you understand how much anxiety I have."  After we went back and forth several times, I explained that she either follow my recommendations or find another provider.  She finally agreed to try.  Decrease Xanax 1 mg to 1 p.o. 3 times daily with an extra 1/2 pill during the day as needed.  She is aware that I am being very lenient with this change and it may take 6 months to get her  down, or off the Xanax.  (I sent in a new prescription for quantity of 105 pills.  I am unsure of what she has left in the current bottle but she stated she would go on down to 3.5 pills a day and I wrote a note to the pharmacist to cancel any future refills.) Increase BuSpar to 10 mg 1 p.o. 3 times daily.  (She still has BuSpar 5 mg and understands that she can take 2 pills 3 times daily until they are gone and then get the prescription for the 10 mg pills) Continue hydroxyzine 25 mg 1 p.o. 3 times daily as needed. Continue mirtazapine 45 mg, 1.5 pills nightly as needed sleep. Continue Risperdal 2 mg, 1 p.o. nightly. Continue Zoloft 100 mg, 2 p.o. daily. Counseling is recommended to help with anxiety. Return in 4 weeks.  After the patient left, I reviewed the neurologist, Dr. Garth Bigness note.  He reviewed the medications with her that could be causing decreased focus and attention and he asked her to cut the Xanax from 4 mg to 2 mg/day.  There was no other mention of psychiatric medications.  Addendum: I have reviewed the PDMP site and Xanax prescription fills are as expected.  Also noted are hydrocodone and oxycodone prescriptions per pain management, not listed on front page of med list and pt didn't disclose.   Donnal Moat, PA-C

## 2019-07-15 NOTE — Patient Instructions (Signed)
On the Xanax 1 mg, decreased to a total of 3.5 pills a day.  Still take 1 pill in the morning 1 around lunch and 1 at bedtime and then take the 1/2 pill during the day whenever you feel most anxious.  Just no more than 3.5 pills daily. On the BuSpar we are increasing that to 10 mg 3 times a day.  The bottle that you have now is 5 mg so you can take 2 pills 3 times a day until you get the new prescription and then follow the directions on that bottle. The goal is to help prevent panic attacks by increasing the BuSpar so you will not need as much Xanax or as often.

## 2019-08-07 ENCOUNTER — Other Ambulatory Visit: Payer: Self-pay | Admitting: Physician Assistant

## 2019-08-10 ENCOUNTER — Other Ambulatory Visit: Payer: Self-pay | Admitting: Physician Assistant

## 2019-08-10 DIAGNOSIS — F411 Generalized anxiety disorder: Secondary | ICD-10-CM

## 2019-08-10 DIAGNOSIS — G4700 Insomnia, unspecified: Secondary | ICD-10-CM

## 2019-08-10 DIAGNOSIS — G4709 Other insomnia: Secondary | ICD-10-CM

## 2019-08-10 DIAGNOSIS — F331 Major depressive disorder, recurrent, moderate: Secondary | ICD-10-CM

## 2019-08-10 NOTE — Telephone Encounter (Signed)
90 day refill not due yet, but has apt tomorrow 05/25

## 2019-08-11 ENCOUNTER — Other Ambulatory Visit: Payer: Self-pay

## 2019-08-11 ENCOUNTER — Ambulatory Visit (INDEPENDENT_AMBULATORY_CARE_PROVIDER_SITE_OTHER): Payer: Medicare HMO | Admitting: Physician Assistant

## 2019-08-11 ENCOUNTER — Encounter: Payer: Self-pay | Admitting: Physician Assistant

## 2019-08-11 DIAGNOSIS — Z79899 Other long term (current) drug therapy: Secondary | ICD-10-CM | POA: Diagnosis not present

## 2019-08-11 DIAGNOSIS — G4709 Other insomnia: Secondary | ICD-10-CM

## 2019-08-11 DIAGNOSIS — R69 Illness, unspecified: Secondary | ICD-10-CM | POA: Diagnosis not present

## 2019-08-11 DIAGNOSIS — F39 Unspecified mood [affective] disorder: Secondary | ICD-10-CM

## 2019-08-11 DIAGNOSIS — F410 Panic disorder [episodic paroxysmal anxiety] without agoraphobia: Secondary | ICD-10-CM | POA: Diagnosis not present

## 2019-08-11 DIAGNOSIS — F411 Generalized anxiety disorder: Secondary | ICD-10-CM | POA: Diagnosis not present

## 2019-08-11 MED ORDER — HYDROXYZINE PAMOATE 25 MG PO CAPS: ORAL_CAPSULE | ORAL | 0 refills | Status: DC

## 2019-08-11 MED ORDER — ALPRAZOLAM 1 MG PO TABS: 1.0000 mg | ORAL_TABLET | Freq: Three times a day (TID) | ORAL | 0 refills | Status: DC | PRN

## 2019-08-11 NOTE — Progress Notes (Signed)
[facility]  Patient ID: [name],  MRN: [id number]  PCP: Pa, [employer]  Date of Evaluation: 08/11/2019 Time spent:30 minutes  Chief Complaint:  Chief Complaint    Follow-up      HISTORY/CURRENT STATUS: HPI For routine f/u.  A month ago, we started weaning down on the Xanax.  That was initiated by a visit she had with her neurologist who she stated recommended she get off of some of her psychiatric medicines.  See Dr.Sater's On chart as well as mine.  [name] says she has still been anxious but "maybe" the BuSpar has helped a little bit.  When I asked specifically about the Xanax and if she has had any problems decreasing it by half a milligram, she said "meh, it's about 50/50."  I asked her to explain and she said something to the effect of she did not understand the reason I would not stop the other medications and leave the Xanax the same.  Says that I do not understand the severity of her illness and the other medications need to be weaned, not the Xanax because it is the one that helps her.  Says she has panic attacks every day.  She gets shaky and unable to do anything.  And when she is not having a panic attack, still feels a sense of unease that is only relieved by Xanax.  Patient denies loss of interest in usual activities and is able to enjoy things.  Denies decreased energy or motivation.  Appetite has not changed.  No extreme sadness, tearfulness, or feelings of hopelessness.  Denies any changes in concentration, making decisions or remembering things.  Denies suicidal or homicidal thoughts.  Patient denies increased energy with decreased need for sleep, no increased talkativeness, no racing thoughts, no impulsivity or risky behaviors, no increased spending, no increased libido, no grandiosity, no increased irritability or anger, and no hallucinations.  No paranoia.  Individual Medical History/ Review of Systems: Changes? :No    Past  medications for mental health diagnoses include: Trazodone, Belsomra, Risperdal, Zoloft, hydroxyzine, mirtazapine, Xanax, Valium, Wellbutrin, Abilify, gabapentin, Strattera, Vyvanse, Ambien, Lunesta, Latuda  Allergies: Penicillins  Current Medications:  Current Outpatient Medications:  .  ALPRAZolam (XANAX) 1 MG tablet, Take 1 tablet (1 mg total) by mouth 3 (three) times daily as needed for anxiety., Disp: 90 tablet, Rfl: 0 .  busPIRone (BUSPAR) 10 MG tablet, TAKE 1 TABLET BY MOUTH THREE TIMES A DAY, Disp: 270 tablet, Rfl: 0 .  cyclobenzaprine (FLEXERIL) 10 MG tablet, Take 10 mg by mouth 2 (two) times daily as needed for muscle spasms., Disp: , Rfl:  .  hydrOXYzine (VISTARIL) 25 MG capsule, TAKE 1 CAPSULE (25 MG TOTAL) BY MOUTH 3 (THREE) TIMES DAILY AS NEEDED., Disp: 270 capsule, Rfl: 0 .  ibuprofen (ADVIL) 600 MG tablet, Take 1 tablet (600 mg total) by mouth every 6 (six) hours as needed for moderate pain. For AFTER surgery only, Disp: 30 tablet, Rfl: 0 .  meclizine (ANTIVERT) 25 MG tablet, Take 25 mg by mouth 3 (three) times daily as needed for dizziness., Disp: , Rfl:  .  Melatonin 5 MG CAPS, Take 5 mg by mouth at bedtime as needed (sleep.). , Disp: , Rfl:  .  mirtazapine (REMERON) 45 MG tablet, Take 1.5 tablets (67 mg total) by mouth at bedtime., Disp: 135 tablet, Rfl: 0 .  omeprazole (PRILOSEC) 40 MG capsule, Take 40 mg by mouth at bedtime. , Disp: , Rfl: 5 .  promethazine (PHENERGAN) 25 MG tablet, Take 25 mg by mouth every 6 (six) hours as needed for nausea or vomiting., Disp: , Rfl:  .  thiamine (VITAMIN B-1) 100 MG tablet, Take 100 mg by mouth daily., Disp: , Rfl:  .  diazepam (VALIUM) 2 MG tablet, 2 po before flying prn. (Patient not taking: Reported on 07/02/2019), Disp: 4 tablet, Rfl: 0 .  predniSONE (DELTASONE) 20 MG tablet, Take 60 mg by mouth daily with breakfast., Disp: , Rfl:  .  risperiDONE (RISPERDAL) 2 MG tablet, TAKE 1 TABLET (2 MG TOTAL) BY MOUTH AT BEDTIME., Disp: 90 tablet,  Rfl: 0 .  sertraline (ZOLOFT) 100 MG tablet, TAKE 2 TABLETS (200 MG TOTAL) BY MOUTH AT BEDTIME., Disp: 180 tablet, Rfl: 0 Medication Side Effects: none  Family Medical/ Social History: Changes? No  MENTAL HEALTH EXAM:  There were no vitals taken for this visit.There is no height or weight on file to calculate BMI.  General Appearance: Casual, Neat and Well Groomed  Eye Contact:  Good  Speech:  Clear and Coherent and Normal Rate  Volume:  Normal  Mood:  Irritable  Affect:  Appropriate  Thought Process:  Goal Directed and Descriptions of Associations: Intact  Orientation:  Full (Time, Place, and Person)  Thought Content: Logical   Suicidal Thoughts:  No  Homicidal Thoughts:  No  Memory:  WNL  Judgement:  Good  Insight:  Fair  Psychomotor Activity:  Normal  Concentration:  Concentration: Good  Recall:  Good  Fund of Knowledge: Good  Language: Good  Assets:  Desire for Improvement  ADL's:  Intact  Cognition: WNL  Prognosis:  Fair    DIAGNOSES:    ICD-10-CM   1. Generalized anxiety disorder  F41.1   2. Other insomnia  G47.09 hydrOXYzine (VISTARIL) 25 MG capsule  3. Panic disorder  F41.0   4. Polypharmacy  Z79.899   5. Episodic mood disorder ([place])  F39     Receiving Psychotherapy: No    RECOMMENDATIONS:  PDMP was reviewed. I spent 30 minutes with her. We discussed decreasing the Xanax.  I reiterated the fact that several of the medication she takes can affect memory and I agree with Dr. Felecia Shelling that decreasing the Xanax will likely be helpful.  Reports that she does not have any memory issues and that is not the reason she went to see Dr. Felecia Shelling.  Patient is argumentative and does not agree with this plan, but states she will follow it because she has no other choice.  Decrease Xanax to 1 mg p.o. 3 times daily as needed. Continue Zoloft 100 mg, 2 p.o. daily. Continue Risperdal 2 mg nightly. Continue mirtazapine 45 mg, 1.5 pills nightly. Continue hydroxyzine 25 mg, 1 p.o.  3 times daily as needed. Increase BuSpar 10 mg, 1 p.o. 4 times daily. Recommend counseling. Return in 4 weeks.  Donnal Moat, PA-C

## 2019-08-11 NOTE — Patient Instructions (Signed)
Increase the Buspar 10 mg to one pill, 4 times per day.  Decrease the Xanax 1 mg, to one pill three times a day.    Everything else will stay the same.

## 2019-08-21 ENCOUNTER — Ambulatory Visit: Payer: Medicaid Other | Admitting: Physician Assistant

## 2019-08-26 DIAGNOSIS — M25522 Pain in left elbow: Secondary | ICD-10-CM | POA: Insufficient documentation

## 2019-08-26 DIAGNOSIS — M25521 Pain in right elbow: Secondary | ICD-10-CM | POA: Insufficient documentation

## 2019-08-27 DIAGNOSIS — M25522 Pain in left elbow: Secondary | ICD-10-CM | POA: Diagnosis not present

## 2019-08-27 DIAGNOSIS — M25521 Pain in right elbow: Secondary | ICD-10-CM | POA: Diagnosis not present

## 2019-08-28 ENCOUNTER — Telehealth: Payer: Self-pay

## 2019-08-28 NOTE — Telephone Encounter (Signed)
Unable to speak  with patient to remind of missed remote transmission 

## 2019-09-07 ENCOUNTER — Other Ambulatory Visit: Payer: Self-pay | Admitting: Physician Assistant

## 2019-09-07 DIAGNOSIS — F411 Generalized anxiety disorder: Secondary | ICD-10-CM

## 2019-09-14 ENCOUNTER — Other Ambulatory Visit: Payer: Self-pay

## 2019-09-14 ENCOUNTER — Ambulatory Visit (INDEPENDENT_AMBULATORY_CARE_PROVIDER_SITE_OTHER): Payer: Medicare HMO | Admitting: Physician Assistant

## 2019-09-14 ENCOUNTER — Encounter: Payer: Self-pay | Admitting: Physician Assistant

## 2019-09-14 DIAGNOSIS — F39 Unspecified mood [affective] disorder: Secondary | ICD-10-CM | POA: Diagnosis not present

## 2019-09-14 DIAGNOSIS — F3341 Major depressive disorder, recurrent, in partial remission: Secondary | ICD-10-CM | POA: Diagnosis not present

## 2019-09-14 DIAGNOSIS — G4700 Insomnia, unspecified: Secondary | ICD-10-CM

## 2019-09-14 DIAGNOSIS — F411 Generalized anxiety disorder: Secondary | ICD-10-CM | POA: Diagnosis not present

## 2019-09-14 DIAGNOSIS — R69 Illness, unspecified: Secondary | ICD-10-CM | POA: Diagnosis not present

## 2019-09-14 MED ORDER — DAYVIGO 5 MG PO TABS: 5.0000 mg | ORAL_TABLET | Freq: Every evening | ORAL | 0 refills | Status: DC | PRN

## 2019-09-14 MED ORDER — ALPRAZOLAM 1 MG PO TABS: 1.0000 mg | ORAL_TABLET | Freq: Three times a day (TID) | ORAL | 0 refills | Status: DC | PRN

## 2019-09-14 NOTE — Patient Instructions (Signed)
Wean off Mirtazepine 45 mg, by taking 1 every night for 5 nights, then 1/2 every night for 5 nights, and then stop.  Call if you have any problems.

## 2019-09-14 NOTE — Progress Notes (Signed)
[facility]  Patient ID: [name],  MRN: [id number]  PCP: System, Pcp Not In  Date of Evaluation: 09/14/2019 Time spent:30 minutes  Chief Complaint:  Chief Complaint    Insomnia; Anxiety; Depression      HISTORY/CURRENT STATUS: HPI For routine f/u.  Biggest problem is not sleeping good.  Feels like the Mirtazepine isn't working at all now.  She's been on it for several years, for sleep only.  Has tried several sleep meds over the years and this has helped more than anything. Doesn't take naps now.  She used to but quit and still can't sleep. Drinks caffeine up until bedtime. Asks about Dayvigo, her dtr takes it and it helps.   The anxiety is still there.  She has a hard time in May, June, July, and August every year due to anniversary dates different deaths in her family, Father's Day, her husband's birthday, to name a few stressors.  She has tolerated decreasing the Xanax but still feels that she needs a higher dose.  Patient denies loss of interest in usual activities and is able to enjoy things.  Denies decreased energy or motivation.  Appetite has not changed.  No extreme sadness, tearfulness, or feelings of hopelessness.  Denies any changes in concentration, making decisions or remembering things.  Denies suicidal or homicidal thoughts.  Patient denies increased energy with decreased need for sleep, no increased talkativeness, no racing thoughts, no impulsivity or risky behaviors, no increased spending, no increased libido, no grandiosity, no increased irritability or anger, and no hallucinations.  No paranoia.  Denies dizziness, syncope, seizures, numbness, tingling, tremor, tics, unsteady gait, slurred speech, confusion. Denies muscle or joint pain, stiffness, or dystonia.  Individual Medical History/ Review of Systems: Changes? :No    Past medications for mental health diagnoses include: Trazodone, Belsomra, Risperdal, Zoloft, hydroxyzine, mirtazapine, Xanax,  Valium, Wellbutrin, Abilify, gabapentin, Strattera, Vyvanse, Ambien, Lunesta, Latuda  Allergies: Penicillins  Current Medications:  Current Outpatient Medications:  .  ALPRAZolam (XANAX) 1 MG tablet, Take 1 tablet (1 mg total) by mouth 3 (three) times daily as needed for anxiety., Disp: 90 tablet, Rfl: 0 .  busPIRone (BUSPAR) 10 MG tablet, TAKE 1 TABLET BY MOUTH THREE TIMES A DAY, Disp: 270 tablet, Rfl: 0 .  cyclobenzaprine (FLEXERIL) 10 MG tablet, Take 10 mg by mouth 2 (two) times daily as needed for muscle spasms., Disp: , Rfl:  .  hydrOXYzine (VISTARIL) 25 MG capsule, TAKE 1 CAPSULE (25 MG TOTAL) BY MOUTH 3 (THREE) TIMES DAILY AS NEEDED., Disp: 270 capsule, Rfl: 0 .  ibuprofen (ADVIL) 600 MG tablet, Take 1 tablet (600 mg total) by mouth every 6 (six) hours as needed for moderate pain. For AFTER surgery only, Disp: 30 tablet, Rfl: 0 .  Melatonin 5 MG CAPS, Take 5 mg by mouth at bedtime as needed (sleep.). , Disp: , Rfl:  .  mirtazapine (REMERON) 45 MG tablet, Take 1.5 tablets (67 mg total) by mouth at bedtime., Disp: 135 tablet, Rfl: 0 .  omeprazole (PRILOSEC) 40 MG capsule, Take 40 mg by mouth at bedtime. , Disp: , Rfl: 5 .  promethazine (PHENERGAN) 25 MG tablet, Take 25 mg by mouth every 6 (six) hours as needed for nausea or vomiting., Disp: , Rfl:  .  risperiDONE (RISPERDAL) 2 MG tablet, TAKE 1 TABLET (2 MG TOTAL) BY MOUTH AT BEDTIME., Disp: 90 tablet, Rfl: 0 .  sertraline (ZOLOFT) 100 MG tablet, TAKE 2 TABLETS (200 MG TOTAL) BY MOUTH AT BEDTIME., Disp:  180 tablet, Rfl: 0 .  thiamine (VITAMIN B-1) 100 MG tablet, Take 100 mg by mouth daily., Disp: , Rfl:  .  diazepam (VALIUM) 2 MG tablet, 2 po before flying prn. (Patient not taking: Reported on 07/02/2019), Disp: 4 tablet, Rfl: 0 .  Lemborexant (DAYVIGO) 5 MG TABS, Take 5 mg by mouth at bedtime as needed., Disp: 30 tablet, Rfl: 0 .  meclizine (ANTIVERT) 25 MG tablet, Take 25 mg by mouth 3 (three) times daily as needed for dizziness. (Patient  not taking: Reported on 09/14/2019), Disp: , Rfl:  .  predniSONE (DELTASONE) 20 MG tablet, Take 60 mg by mouth daily with breakfast. (Patient not taking: Reported on 09/14/2019), Disp: , Rfl:  Medication Side Effects: none  Family Medical/ Social History: Changes? No  MENTAL HEALTH EXAM:  There were no vitals taken for this visit.There is no height or weight on file to calculate BMI.  General Appearance: Casual, Neat and Well Groomed  Eye Contact:  Good  Speech:  Clear and Coherent and Normal Rate  Volume:  Normal  Mood:  Euthymic  Affect:  Appropriate  Thought Process:  Goal Directed and Descriptions of Associations: Intact  Orientation:  Full (Time, Place, and Person)  Thought Content: Logical   Suicidal Thoughts:  No  Homicidal Thoughts:  No  Memory:  WNL  Judgement:  Good  Insight:  Fair  Psychomotor Activity:  Normal  Concentration:  Concentration: Good and Attention Span: Good  Recall:  Good  Fund of Knowledge: Good  Language: Good  Assets:  Desire for Improvement  ADL's:  Intact  Cognition: WNL  Prognosis:  Fair    DIAGNOSES:    ICD-10-CM   1. Insomnia, unspecified type  G47.00   2. Generalized anxiety disorder  F41.1   3. Recurrent major depressive disorder, in partial remission (HCC)  F33.41   4. Episodic mood disorder ([place])  F39     Receiving Psychotherapy: No    RECOMMENDATIONS:  PDMP was reviewed. I provided 30 minutes of face-to-face care during this encounter. We discussed the insomnia.  Even though she has been drinking caffeine up until bedtime for a long time, she may be more sensitive to it now.  I do recommend that she have no caffeine after 2:00 in the afternoon.  Discussed other sleep hygiene techniques.  We can wean off of the mirtazapine and try the Dayvigo.   Because we are making those changes, I will not continue to wean the Xanax at this point.  We will reevaluate that at the next visit. She ask again about whether she will ever be able to  decrease or get rid of the Zoloft or Risperdal, hydroxyzine or BuSpar.  I reminded her that these drugs are to help prevent and treat depression as well as anxiety so at this point I would say we need to keep her on them, but we can definitely talk about it in the future depending on how she is. Wean off Mirtazepine 45 mg, 1 p.o. nightly for 5 nights, then one half p.o. nightly for 5 nights and then stop.  If she has any problems weaning off such as dizziness, nausea vomiting, sweats, brain zaps, or any other symptoms, call.  I may need to call in a 15 mg pill and wean slower.   Start Dayvigo 5 mg, 1 p.o. nightly as needed. Continue Xanax 1 mg, 1 p.o. 3 times daily. Continue Zoloft 100 mg, 2 p.o. daily. Continue Risperdal 2 mg nightly. Continue hydroxyzine 25  mg, 1 p.o. 3 times daily as needed. Continue Buspar 10 mg tid.  Recommend counseling. Return in 4 weeks.  Donnal Moat, PA-C

## 2019-09-15 ENCOUNTER — Telehealth: Payer: Self-pay

## 2019-09-15 ENCOUNTER — Telehealth: Payer: Self-pay | Admitting: Physician Assistant

## 2019-09-15 NOTE — Telephone Encounter (Signed)
Pt will need a PA for medication Dayvigo. Sent in to [facility] on [address] yesterday. Pt has [employer]. Please call if we do PA.

## 2019-09-15 NOTE — Telephone Encounter (Signed)
Approved effective 03/20/2019-03/18/2020, patient aware.

## 2019-09-15 NOTE — Telephone Encounter (Signed)
Prior authorization submitted and approved with [employer] for DAYVIGO 5 MG effective 03/20/2019-03/18/2020  ID# [id number]

## 2019-09-15 NOTE — Telephone Encounter (Signed)
Prior authorization submitted with [employer] ID# [id number] for Dayvigo 5 mg, pending response at this time

## 2019-09-17 ENCOUNTER — Emergency Department (HOSPITAL_COMMUNITY)
Admission: EM | Admit: 2019-09-17 | Discharge: 2019-09-17 | Disposition: A | Discharge status: Home / Self Care | Payer: Medicare HMO | Attending: Emergency Medicine | Admitting: Emergency Medicine

## 2019-09-17 ENCOUNTER — Other Ambulatory Visit: Payer: Self-pay | Admitting: Cardiovascular Disease

## 2019-09-17 ENCOUNTER — Emergency Department (HOSPITAL_COMMUNITY): Payer: Medicare HMO

## 2019-09-17 ENCOUNTER — Encounter (HOSPITAL_COMMUNITY): Payer: Self-pay | Admitting: Emergency Medicine

## 2019-09-17 ENCOUNTER — Telehealth: Payer: Self-pay | Admitting: Cardiovascular Disease

## 2019-09-17 DIAGNOSIS — Z79899 Other long term (current) drug therapy: Secondary | ICD-10-CM | POA: Insufficient documentation

## 2019-09-17 DIAGNOSIS — Z8541 Personal history of malignant neoplasm of cervix uteri: Secondary | ICD-10-CM | POA: Insufficient documentation

## 2019-09-17 DIAGNOSIS — R55 Syncope and collapse: Secondary | ICD-10-CM | POA: Diagnosis not present

## 2019-09-17 DIAGNOSIS — R079 Chest pain, unspecified: Secondary | ICD-10-CM | POA: Diagnosis not present

## 2019-09-17 DIAGNOSIS — R7989 Other specified abnormal findings of blood chemistry: Secondary | ICD-10-CM

## 2019-09-17 DIAGNOSIS — F17210 Nicotine dependence, cigarettes, uncomplicated: Secondary | ICD-10-CM | POA: Diagnosis not present

## 2019-09-17 DIAGNOSIS — R000 Tachycardia, unspecified: Secondary | ICD-10-CM | POA: Diagnosis not present

## 2019-09-17 DIAGNOSIS — R072 Precordial pain: Secondary | ICD-10-CM | POA: Diagnosis not present

## 2019-09-17 DIAGNOSIS — Z950 Presence of cardiac pacemaker: Secondary | ICD-10-CM | POA: Diagnosis not present

## 2019-09-17 DIAGNOSIS — R0789 Other chest pain: Secondary | ICD-10-CM | POA: Diagnosis not present

## 2019-09-17 DIAGNOSIS — R0689 Other abnormalities of breathing: Secondary | ICD-10-CM | POA: Diagnosis not present

## 2019-09-17 DIAGNOSIS — R69 Illness, unspecified: Secondary | ICD-10-CM | POA: Diagnosis not present

## 2019-09-17 DIAGNOSIS — J811 Chronic pulmonary edema: Secondary | ICD-10-CM | POA: Diagnosis not present

## 2019-09-17 LAB — CBC WITH DIFFERENTIAL/PLATELET
Abs Immature Granulocytes: 0.03 10*3/uL (ref 0.00–0.07)
Basophils Absolute: 0.1 10*3/uL (ref 0.0–0.1)
Basophils Relative: 1 %
Eosinophils Absolute: 0.2 10*3/uL (ref 0.0–0.5)
Eosinophils Relative: 1 %
HCT: 45.8 % (ref 36.0–46.0)
Hemoglobin: 14.7 g/dL (ref 12.0–15.0)
Immature Granulocytes: 0 %
Lymphocytes Relative: 25 %
Lymphs Abs: 2.8 10*3/uL (ref 0.7–4.0)
MCH: 28.8 pg (ref 26.0–34.0)
MCHC: 32.1 g/dL (ref 30.0–36.0)
MCV: 89.6 fL (ref 80.0–100.0)
Monocytes Absolute: 0.8 10*3/uL (ref 0.1–1.0)
Monocytes Relative: 7 %
Neutro Abs: 7.3 10*3/uL (ref 1.7–7.7)
Neutrophils Relative %: 66 %
Platelets: 385 10*3/uL (ref 150–400)
RBC: 5.11 MIL/uL (ref 3.87–5.11)
RDW: 14.2 % (ref 11.5–15.5)
WBC: 11.2 10*3/uL — ABNORMAL HIGH (ref 4.0–10.5)
nRBC: 0 % (ref 0.0–0.2)

## 2019-09-17 LAB — BASIC METABOLIC PANEL WITH GFR
Anion gap: 10 (ref 5–15)
BUN: 6 mg/dL — ABNORMAL LOW (ref 8–23)
CO2: 23 mmol/L (ref 22–32)
Calcium: 9.2 mg/dL (ref 8.9–10.3)
Chloride: 106 mmol/L (ref 98–111)
Creatinine, Ser: 0.77 mg/dL (ref 0.44–1.00)
GFR calc Af Amer: >60 mL/min
GFR calc non Af Amer: >60 mL/min
Glucose, Bld: 114 mg/dL — ABNORMAL HIGH (ref 70–99)
Potassium: 3.3 mmol/L — ABNORMAL LOW (ref 3.5–5.1)
Sodium: 139 mmol/L (ref 135–145)

## 2019-09-17 LAB — D-DIMER, QUANTITATIVE: D-Dimer, Quant: 1.2 ug/mL-FEU — ABNORMAL HIGH (ref 0.00–0.50)

## 2019-09-17 LAB — TROPONIN I (HIGH SENSITIVITY)
Troponin I (High Sensitivity): 3 ng/L (ref <18)
Troponin I (High Sensitivity): 3 ng/L (ref <18)

## 2019-09-17 NOTE — Discharge Instructions (Addendum)
Follow up with cardiology  Return for new or worsening symptoms

## 2019-09-17 NOTE — ED Provider Notes (Signed)
[facility] EMERGENCY DEPARTMENT Provider Note   CSN: [id number] Arrival date & time: 09/17/19  4854     History Chief Complaint  Patient presents with  . Chest Pain    [name] is a 64 y.o. female with past medical history significant for anxiety, bipolar, s/p pacemaker who presents for evaluation of chest pain.  Patient states she woke up this morning with chest pain.  Located to left side of her chest.  Was pleuritic in nature.  Did not radiate.  Lasted for about an hour and a half.  Resolved with nitro and aspirin.  States she does have history of anxiety and panic attacks however states this felt different.  She denies any prior exertional chest pain however states she typically does not exert herself or exercise.  States she did feel nauseous and was sweating when this occurred.  She felt mildly short of breath.  No recent surgery, mobilization, malignancy.  She did feel tremulous.  Patient states she did not have any reason to be anxious.  Her current pain is 0/10.  Denies headache, lightheadedness, dizziness, abdominal pain, diarrhea, dysuria, radiation of pain to jaw, arm, back, paresthesias, weakness, redness, swelling, warmth to extremities.  Denies additional aggravating or relieving factors.  No prior history of PE or DVT.  History obtained from patient and past medical records.  No interpreter is used.  HPI     Past Medical History:  Diagnosis Date  . Anxiety    anxiety and mild depressive order systoms  . Bipolar disorder ([place])     (02/20/2013)  . Cervical cancer ([place]) 03/19/1980  . Depression   . Dysrhythmia   . OEVOJJKK(938.1)    "monthly" (02/20/2013)  . Pacemaker    medtronic  . PONV (postoperative nausea and vomiting)   . Sinus pause 02/14/2013  . Syncope and collapse    echo-April 12,2012-EF 55% Echo normal; recorder with prolonged sinus pauses --> status post  . Tobacco abuse   . Vertigo     Patient Active Problem List   Diagnosis  Date Noted  . Memory loss 07/02/2019  . Depression with anxiety 07/02/2019  . B12 deficiency 07/02/2019  . Lumbar radiculopathy 02/10/2019  . History of fusion of lumbar spine 02/10/2019  . Dysesthesia 02/10/2019  . Retroperitoneal fibrosis   . Pelvic adhesions   . Left ovarian cyst 11/17/2018  . Elevated tumor markers 11/17/2018  . Dyspnea 09/15/2018  . Chest pain of uncertain etiology 82/99/3716  . Mood disorder ([place]) 04/16/2018  . Insomnia 04/16/2018  . Attention deficit hyperactivity disorder (ADHD) 04/16/2018  . Neck pain on right side 09/09/2014  . 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 Family history of arrhythmogenic right ventricular cardiomyopathy 03/30/2013  . 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 02/20/2013  . Sinus pause 02/14/2013  . Near syncope 10/29/2012  . Hx of syncope- s/p Loop recorder 11/06/12 10/29/2012  . Vertigo 10/29/2012  . Tobacco abuse 10/29/2012  . ANXIETY 11/14/2009  . CERUMEN IMPACTION, RIGHT 11/14/2009  . BENIGN POSITIONAL VERTIGO 11/14/2009  . EAR PAIN, RIGHT 11/14/2009  . SCIATICA 05/09/2009    Past Surgical History:  Procedure Laterality Date  . ABDOMINAL HYSTERECTOMY  03/19/1980   "partial"  . BACK SURGERY    . CERVICAL FUSION Left 11/2013   C4-C6  . CERVICAL FUSION     3,4,5,  . INSERT / REPLACE / REMOVE PACEMAKER  02/20/2013   medtronic  . LOOP RECORDER IMPLANT N/A 11/06/2012   Procedure:  LINQ LOOP RECORDER IMPLANT;  Surgeon: Sanda Klein, MD;  Location: [facility] CATH LAB;  Service: Cardiovascular;  Laterality: N/A;  . NECK SURGERY  11/17/2013  . PACEMAKER PLACEMENT N/A 02/16/2013  . PERMANENT PACEMAKER INSERTION N/A 02/20/2013   Procedure: PERMANENT PACEMAKER INSERTION;  Surgeon: Sanda Klein, MD;  Location: [facility] CATH LAB;  Service: Cardiovascular;  Laterality: N/A;  . POSTERIOR LUMBAR FUSION  ~ 2009  . ROBOTIC ASSISTED SALPINGO OOPHERECTOMY Bilateral 11/25/2018   Procedure: XI ROBOTIC ASSISTED BILATERAL SALPINGO  OOPHORECTOMY and LYSIS OF ADHESIONS.;  Surgeon: Everitt Amber, MD;  Location: [facility];  Service: Gynecology;  Laterality: Bilateral;  . SPINAL FUSION  03/19/2014  . TONSILLECTOMY    . TRANSTHORACIC ECHOCARDIOGRAM  07/11/2010   The left atrial size is normal.There is no evidence of mitral vavle prolaspe.Right ventriicular systolic pressure is normal . Injection of contrast documented no interatrial shunt. Essentially normal 2D echo -doppler study      OB History   No obstetric history on file.     Family History  Adopted: Yes  Problem Relation Age of Onset  . Heart attack Brother     Social History   Tobacco Use  . Smoking status: Current Every Day Smoker    Packs/day: 0.75    Years: 40.00    Pack years: 30.00    Types: Cigarettes  . Smokeless tobacco: Never Used  . Tobacco comment: 14 cigarette per day  Vaping Use  . Vaping Use: Never used  Substance Use Topics  . Alcohol use: No  . Drug use: No    Home Medications Prior to Admission medications   Medication Sig Start Date End Date Taking? Authorizing Provider  ALPRAZolam Duanne Moron) 1 MG tablet Take 1 tablet (1 mg total) by mouth 3 (three) times daily as needed for anxiety. 09/14/19  Yes Hurst, Helene Kelp T, PA-C  busPIRone (BUSPAR) 10 MG tablet TAKE 1 TABLET BY MOUTH THREE TIMES A DAY 08/07/19  Yes Hurst, Teresa T, PA-C  hydrOXYzine (VISTARIL) 25 MG capsule TAKE 1 CAPSULE (25 MG TOTAL) BY MOUTH 3 (THREE) TIMES DAILY AS NEEDED. Patient taking differently: Take 25 mg by mouth every 8 (eight) hours as needed for anxiety. TAKE 1 CAPSULE (25 MG TOTAL) BY MOUTH 3 (THREE) TIMES DAILY AS NEEDED. 08/11/19  Yes Hurst, Teresa T, PA-C  Lemborexant (DAYVIGO) 5 MG TABS Take 5 mg by mouth at bedtime as needed. 09/14/19  Yes Hurst, Dorothea Glassman, PA-C  omeprazole (PRILOSEC) 40 MG capsule Take 40 mg by mouth daily as needed (heartburn).  08/21/14  Yes [provider]  risperiDONE (RISPERDAL) 2 MG tablet TAKE 1 TABLET (2 MG TOTAL) BY MOUTH AT BEDTIME.  08/11/19  Yes Hurst, Teresa T, PA-C  sertraline (ZOLOFT) 100 MG tablet TAKE 2 TABLETS (200 MG TOTAL) BY MOUTH AT BEDTIME. 08/11/19  Yes Hurst, Teresa T, PA-C  thiamine (VITAMIN B-1) 100 MG tablet Take 100 mg by mouth daily.   Yes [provider]  diazepam (VALIUM) 2 MG tablet 2 po before flying prn. Patient not taking: Reported on 07/02/2019 02/20/19   Donnal Moat T, PA-C  ibuprofen (ADVIL) 600 MG tablet Take 1 tablet (600 mg total) by mouth every 6 (six) hours as needed for moderate pain. For AFTER surgery only Patient not taking: Reported on 09/17/2019 11/17/18   Joylene John D, NP    Allergies    Penicillins  Review of Systems   Review of Systems  Constitutional: Negative.   HENT: Negative.   Respiratory: Positive for chest tightness  and shortness of breath. Negative for apnea, cough, choking, wheezing and stridor.   Cardiovascular: Positive for chest pain and palpitations. Negative for leg swelling.  Gastrointestinal: Positive for nausea. Negative for abdominal distention, abdominal pain, anal bleeding, blood in stool, constipation, diarrhea, rectal pain and vomiting.  Genitourinary: Negative.   Musculoskeletal: Negative.   Skin: Negative.   Neurological: Negative.   All other systems reviewed and are negative.   Physical Exam Updated Vital Signs BP (!) 127/92 (BP Location: Right Arm)   Pulse 84   Temp 98.8 F (37.1 C) (Oral)   Resp 16   Ht 5\' 7"  (1.702 m)   Wt 59 kg   SpO2 100%   BMI 20.36 kg/m   Physical Exam Vitals and nursing note reviewed.  Constitutional:      General: She is not in acute distress.    Appearance: She is not ill-appearing, toxic-appearing or diaphoretic.  HENT:     Head: Normocephalic and atraumatic.     Jaw: There is normal jaw occlusion.  Neck:     Vascular: No carotid bruit or JVD.     Trachea: Trachea and phonation normal.  Cardiovascular:     Rate and Rhythm: Tachycardia present.     Pulses: Normal pulses.          Radial  pulses are 2+ on the right side and 2+ on the left side.       Dorsalis pedis pulses are 2+ on the right side and 2+ on the left side.       Posterior tibial pulses are 2+ on the right side and 2+ on the left side.     Heart sounds: Normal heart sounds.     Comments: HR 115 in room Pulmonary:     Effort: Pulmonary effort is normal.     Breath sounds: Normal breath sounds and air entry.     Comments: Clear to auscultation without wheeze, rhonchi or rales.  No accessory muscle usage. Chest:     Comments: No crepitus, step-offs.  Equal rise and fall of chest Abdominal:     General: Bowel sounds are normal.     Palpations: Abdomen is soft.  Musculoskeletal:        General: Normal range of motion.     Cervical back: Full passive range of motion without pain, normal range of motion and neck supple.     Right lower leg: No tenderness. No edema.     Left lower leg: No tenderness. No edema.     Comments: Moves all 4 extremities at difficulty.  No bony tenderness.  Bevelyn Buckles' sign negative.  No edema, erythema or warmth  Skin:    General: Skin is warm.     Capillary Refill: Capillary refill takes less than 2 seconds.     Comments: Tactile temperature to extremities.  No edema, erythema or warmth.  No fluctuance or induration.  Neurological:     General: No focal deficit present.     Mental Status: She is alert.     Comments: Cranial nerves II through XII grossly intact.  Moves all 4 extremities without difficulty    ED Results / Procedures / Treatments   Labs (all labs ordered are listed, but only abnormal results are displayed) Labs Reviewed  CBC WITH DIFFERENTIAL/PLATELET - Abnormal; Notable for the following components:      Result Value   WBC 11.2 (*)    All other components within normal limits  BASIC METABOLIC PANEL - Abnormal; Notable for  the following components:   Potassium 3.3 (*)    Glucose, Bld 114 (*)    BUN 6 (*)    All other components within normal limits  D-DIMER,  QUANTITATIVE (NOT AT [facility]) - Abnormal; Notable for the following components:   D-Dimer, Quant 1.20 (*)    All other components within normal limits  TROPONIN I (HIGH SENSITIVITY)  TROPONIN I (HIGH SENSITIVITY)    EKG EKG Interpretation  Date/Time:  Thursday September 17 2019 09:33:17 EDT Ventricular Rate:  104 PR Interval:    QRS Duration: 75 QT Interval:  354 QTC Calculation: 466 R Axis:   5 Text Interpretation: Sinus tachycardia Probable left atrial enlargement Abnormal R-wave progression, early transition No significant change since last tracing Confirmed by Blanchie Dessert [phone]) on 09/17/2019 9:55:12 AM   Radiology DG Chest 2 View  Result Date: 09/17/2019 CLINICAL DATA:  Chest pain. EXAM: CHEST - 2 VIEW COMPARISON:  CT chest 04/19/2018, chest radiograph 11/21/2018. FINDINGS: Redemonstrated left chest dual lead pacer device. Heart size at the upper limits of normal, unchanged. Aortic atherosclerosis. There is no appreciable airspace consolidation or frank pulmonary edema. No evidence of pleural effusion or pneumothorax. No acute bony abnormality identified. Thoracic spondylosis. IMPRESSION: No evidence of acute cardiopulmonary abnormality. Heart size at the upper limits of normal, unchanged. Aortic Atherosclerosis (ICD10-I70.0). Electronically Signed   By: Kellie Simmering DO   On: 09/17/2019 11:12    Procedures Procedures (including critical care time)  Medications Ordered in ED Medications - No data to display  ED Course  I have reviewed the triage vital signs and the nursing notes.  Pertinent labs & imaging results that were available during my care of the patient were reviewed by me and considered in my medical decision making (see chart for details).  64 year old female presents for evaluation of chest pain.  Left-sided chest pain which began this morning and lasted approximately hour and a half.  Resolved with nitro and aspirin.  Associated diaphoresis, palpitations, mild  shortness of breath and some nausea.  States she does not get exertional chest pain at home.  Labs and imaging personally reviewed and interpreted CBC with mild leukocytosis at 3.87 metabolic panel mild hypokalemia to 3.3, hyperglycemia to 114 Delta troponin flat 3>>3 Times elevated 1.02, will place order for CTA chest to r/o PE given pleuritic CP EKG without STEMI  Patient reassessed.  Continues to be without chest pain.  Pending CT scan  Patient reassessed.  States she would like to be discharged.  States she no longer wants a CT scan.  She feels that her symptoms were more related to anxiety.  Discussed with patient concerning story of diaphoresis, palpitations, shortness of breath and nausea in setting of chest pain and elevated D-dimer.  Patient states she would like to be discharged and will follow up with cardiology.  Patient understands she will be leaving [place].  We discussed the nature and purpose, risks and benefits, as well as, the alternatives of treatment. Time was given to allow the opportunity to ask questions and consider their options, and after the discussion, the patient decided to refuse the offerred treatment. The patient was informed that refusal could lead to, but was not limited to, death, permanent disability, or severe pain. If present, I asked the relatives or significant others to dissuade them without success. Prior to refusing, I determined that the patient had the capacity to make their decision and understood the consequences of that decision. After refusal, I made every  reasonable opportunity to treat them to the best of my ability.  The patient was notified that they may return to the emergency department at any time for further treatment.      MDM Rules/Calculators/A&P                           Final Clinical Impression(s) / ED Diagnoses Final diagnoses:  Precordial pain  Elevated d-dimer    Rx / DC Orders ED Discharge Orders    None         Maylynn Orzechowski A, PA-C 09/17/19 1349    Blanchie Dessert, MD 09/18/19 [phone]

## 2019-09-17 NOTE — ED Triage Notes (Signed)
Pt arrives [facility] with c/o of cp that she woke up with- pt took 324 asa and 1 sl nitro with ems. Pt reports she does have a pacemaker.

## 2019-09-17 NOTE — Telephone Encounter (Signed)
Pt c/o of Chest Pain: STAT if CP now or developed within 24 hours  1. Are you having CP right now? Yes   2. Are you experiencing any other symptoms (ex. SOB, nausea, vomiting, sweating)? Sweating   3. How long have you been experiencing CP? Woke up with it this morning at 7 AM  4. Is your CP continuous or coming and going? Continuous   5. Have you taken Nitroglycerin? No doesn't have any  ?

## 2019-09-17 NOTE — Telephone Encounter (Signed)
Received call from patient complaining of active chest pain.    She states she is "nervous" and "afraid" right now.  She states she woke up this morning and was having squeezing sensation in her chest.   Reports diaphoresis.  Denies SOB/N/V.    Unable to take BP and HR at home.   Denies increased stress/anxiety at home.    Unsure if it gets worse with exertion/rest.   NO NTG, no hx of CAD.  Patient extremely anxious, hyperventilating on the phone.       Last OV with Dr. Sallyanne Kuster 1 year ago: hx of PPM, anxiety, syncope Coronary CTA 09/2018-calcium score 0   Advised due to active chest pain, only way to evaluate for acute issues is to proceed to ER or call 911 for evaluation.  Patient agreed and verbalized understanding.

## 2019-09-20 NOTE — Progress Notes (Signed)
Virtual Visit via Video Note   This visit type was conducted due to national recommendations for restrictions regarding the COVID-19 Pandemic (e.g. social distancing) in an effort to limit this patient's exposure and mitigate transmission in our community.  Due to her co-morbid illnesses, this patient is at least at moderate risk for complications without adequate follow up.  This format is felt to be most appropriate for this patient at this time.  All issues noted in this document were discussed and addressed.  A limited physical exam was performed with this format.  Please refer to the patient's chart for her consent to telehealth for [facility].   Date:  09/20/2019   ID:  [name], DOB 05-09-1955, MRN [id number]  Patient Location: Home Provider Location: Office  PCP:  System, Pcp Not In  Cardiologist: Dr.Croitoru    Electrophysiologist:  None   Evaluation Performed:  Follow-Up Visit  Chief Complaint:  Chest Pressure   History of Present Illness:    [name] is a 64 y.o. female we are following for ongoing assessment and management with history of syncopal episode and sinus pauses in 2014 status post dual-chamber permanent pacemaker.  Per Dr. Victorino December note, it was unclear whether she had conduction system disease or a severe cardioinhibitory neurally mediated syncopal episode.  He felt that the latter was the most likely etiology.  He noted that she required very little pacing, but after pacemaker was inserted there was no further episodes of syncope.  It was also noted that her daughter, [name], has inappropriate sinus tachycardia, she also had another daughter who had a stillborn child with a BMP mutation for ARVC, and may also be a mutation carrier, although this was unclear as well.  On last office visit the patient described occasional chest pain on the left side, uncertain if it was related to her breast or deeper inside her chest..  It was on related to activity  and occurred randomly.  Resolve spontaneously.  She also complained of occasional problems with shortness of breath which "comes and goes" and also not related to physical activity.  She unfortunately continued to smoke although she is cut back on her consumption.  She has a 30-pack-year history of smoking.  Was also documented that her brother was recently diagnosed with CAD, was also noted to be a smoker.  She was concerned about her chances of CAD as well.  Echocardiogram revealed normal LV systolic function with an EF of 55 to 60% without any wall motions abnormalities and grade 1 diastolic dysfunction.  Based upon her family history, her recurrent chest discomfort, she was planned for a coronary CTA.  If there was evidence of coronary stenosis or a high calcium score she would to be started on a statin.  She was to continue remote downloads of her pacemaker every 3 months with yearly office visits.  She was strongly encouraged on smoking cessation.  She would be considered for a formal referral to pulmonology if coronary CTA was negative  Coronary artery CTA on 09/29/2018 revealed a coronary calcium score of 0.  There was no significant coronary artery disease noted.  There was mild cardiac enlargement.  No suspicious pulmonary masses nodule or adenopathy was identified on CT of the chest.  She comes today with continued chest pressure symptoms.  No associated shortness of breath diaphoresis dizziness, PND, orthopnea, or significant fatigue.  She is being followed now by orthopedist and is due to see [facility]  tomorrow for further evaluation for spinal issues which may be causing the symptoms.  The patient does not  have symptoms concerning for COVID-19 infection (fever, chills, cough, or new shortness of breath).    Past Medical History:  Diagnosis Date  . Anxiety    anxiety and mild depressive order systoms  . Bipolar disorder ([place])     (02/20/2013)  . Cervical cancer  ([place]) 03/19/1980  . Depression   . Dysrhythmia   . SWNIOEVO(350.0)    "monthly" (02/20/2013)  . Pacemaker    medtronic  . PONV (postoperative nausea and vomiting)   . Sinus pause 02/14/2013  . Syncope and collapse    echo-April 12,2012-EF 55% Echo normal; recorder with prolonged sinus pauses --> status post  . Tobacco abuse   . Vertigo    Past Surgical History:  Procedure Laterality Date  . ABDOMINAL HYSTERECTOMY  03/19/1980   "partial"  . BACK SURGERY    . CERVICAL FUSION Left 11/2013   C4-C6  . CERVICAL FUSION     3,4,5,  . INSERT / REPLACE / REMOVE PACEMAKER  02/20/2013   medtronic  . LOOP RECORDER IMPLANT N/A 11/06/2012   Procedure: LINQ LOOP RECORDER IMPLANT;  Surgeon: Sanda Klein, MD;  Location: [facility] CATH LAB;  Service: Cardiovascular;  Laterality: N/A;  . NECK SURGERY  11/17/2013  . PACEMAKER PLACEMENT N/A 02/16/2013  . PERMANENT PACEMAKER INSERTION N/A 02/20/2013   Procedure: PERMANENT PACEMAKER INSERTION;  Surgeon: Sanda Klein, MD;  Location: [facility] CATH LAB;  Service: Cardiovascular;  Laterality: N/A;  . POSTERIOR LUMBAR FUSION  ~ 2009  . ROBOTIC ASSISTED SALPINGO OOPHERECTOMY Bilateral 11/25/2018   Procedure: XI ROBOTIC ASSISTED BILATERAL SALPINGO OOPHORECTOMY and LYSIS OF ADHESIONS.;  Surgeon: Everitt Amber, MD;  Location: [facility];  Service: Gynecology;  Laterality: Bilateral;  . SPINAL FUSION  03/19/2014  . TONSILLECTOMY    . TRANSTHORACIC ECHOCARDIOGRAM  07/11/2010   The left atrial size is normal.There is no evidence of mitral vavle prolaspe.Right ventriicular systolic pressure is normal . Injection of contrast documented no interatrial shunt. Essentially normal 2D echo -doppler study      No outpatient medications have been marked as taking for the 09/22/19 encounter (Appointment) with Lendon Colonel, NP.     Allergies:   Penicillins   Social History   Tobacco Use  . Smoking status: Current Every Day Smoker    Packs/day: 0.75    Years: 40.00    Pack years:  30.00    Types: Cigarettes  . Smokeless tobacco: Never Used  . Tobacco comment: 14 cigarette per day  Vaping Use  . Vaping Use: Never used  Substance Use Topics  . Alcohol use: No  . Drug use: No     Family Hx: The patient's family history includes Heart attack in her brother. She was adopted.  ROS:   Please see the history of present illness.    All other systems reviewed and are negative.   Prior CV studies:   The following studies were reviewed today:  Coronary CTA 09/29/2018  IMPRESSION: 1. Coronary artery calcium score 0 Agatston units. This suggests low risk for future cardiac events.  2.  No significant coronary disease was noted.  Labs/Other Tests and Data Reviewed:    EKG:  No ECG reviewed.  Recent Labs: 11/21/2018: ALT 22 09/17/2019: BUN 6; Creatinine, Ser 0.77; Hemoglobin 14.7; Platelets 385; Potassium 3.3; Sodium 139   Recent Lipid Panel Lab Results  Component Value Date/Time   CHOL 256 (H) 09/24/2018 10:19  AM   TRIG 130 09/24/2018 10:19 AM   HDL 50 09/24/2018 10:19 AM   CHOLHDL 5.1 (H) 09/24/2018 10:19 AM   LDLCALC 179 (H) 09/24/2018 10:19 AM    Wt Readings from Last 3 Encounters:  09/17/19 130 lb (59 kg)  07/02/19 131 lb 8 oz (59.6 kg)  02/10/19 137 lb 5 oz (62.3 kg)     Objective:    Vital Signs:  There were no vitals taken for this visit.   VITAL SIGNS:  reviewed GEN:  no acute distress EYES:  sclerae anicteric, EOMI - Extraocular Movements Intact RESPIRATORY:  normal respiratory effort, symmetric expansion NEURO:  alert and oriented x 3, no obvious focal deficit PSYCH:  normal affect  ASSESSMENT & PLAN:    1.  Chest pressure: She has been thoroughly evaluated by cardiology to determine if discomfort was related to CAD.  Coronary CTA and calcium score were negative.  No further cardiac testing is recommended at this time.  2.  Rule out esophageal spasms: She complains of chest pressure that is relieved with nitroglycerin.  May need to  consider referral to GI.  She states that she had been on PPI in the past which help with heartburn.  Further assessment may be needed to evaluate for hiatal hernia or other GI issues which may be causing her chest pressure.  3.  Ongoing tobacco abuse: Cessation is recommended.  4.  Pacemaker in situ: To be followed by Dr. Sallyanne Kuster with remote checks and inpatient evaluation as needed  COVID-19 Education: The signs and symptoms of COVID-19 were discussed with the patient and how to seek care for testing (follow up with PCP or arrange E-visit).  The importance of social distancing was discussed today.  Time:   Today, I have spent 10 minutes with the patient with telehealth technology discussing the above problems.     Medication Adjustments/Labs and Tests Ordered: Current medicines are reviewed at length with the patient today.  Concerns regarding medicines are outlined above.   Tests Ordered: No orders of the defined types were placed in this encounter.   Medication Changes: No orders of the defined types were placed in this encounter.   Disposition:  Follow up PRN. See PPM clinic per protocol.  Signed, Phill Myron. [place] Pugh, ANP, AACC  09/20/2019 4:44 PM    [facility]

## 2019-09-22 ENCOUNTER — Telehealth (INDEPENDENT_AMBULATORY_CARE_PROVIDER_SITE_OTHER): Payer: Medicare HMO | Admitting: Adult Health

## 2019-09-22 ENCOUNTER — Encounter: Payer: Self-pay | Admitting: Adult Health

## 2019-09-22 VITALS — Ht 67.0 in | Wt 125.0 lb

## 2019-09-22 DIAGNOSIS — K224 Dyskinesia of esophagus: Secondary | ICD-10-CM

## 2019-09-22 DIAGNOSIS — Z950 Presence of cardiac pacemaker: Secondary | ICD-10-CM | POA: Diagnosis not present

## 2019-09-22 DIAGNOSIS — R0789 Other chest pain: Secondary | ICD-10-CM

## 2019-09-22 DIAGNOSIS — Z720 Tobacco use: Secondary | ICD-10-CM | POA: Diagnosis not present

## 2019-09-22 NOTE — Patient Instructions (Signed)
Medication Instructions:  Continue current medications  *If you need a refill on your cardiac medications before your next appointment, please call your pharmacy*   Lab Work: None Ordered   Testing/Procedures: None Ordered   Follow-Up: At [facility], you and your health needs are our priority.  As part of our continuing mission to provide you with exceptional heart care, we have created designated Provider Care Teams.  These Care Teams include your primary Cardiologist (physician) and Advanced Practice Providers (APPs -  Physician Assistants and Nurse Practitioners) who all work together to provide you with the care you need, when you need it.  We recommend signing up for the patient portal called "MyChart".  Sign up information is provided on this After Visit Summary.  MyChart is used to connect with patients for Virtual Visits (Telemedicine).  Patients are able to view lab/test results, encounter notes, upcoming appointments, etc.  Non-urgent messages can be sent to your provider as well.   To learn more about what you can do with MyChart, go to [email or web address].    Your next appointment:   As Needed  

## 2019-09-23 DIAGNOSIS — M47812 Spondylosis without myelopathy or radiculopathy, cervical region: Secondary | ICD-10-CM | POA: Insufficient documentation

## 2019-09-23 DIAGNOSIS — M25521 Pain in right elbow: Secondary | ICD-10-CM | POA: Diagnosis not present

## 2019-09-23 DIAGNOSIS — M25522 Pain in left elbow: Secondary | ICD-10-CM | POA: Diagnosis not present

## 2019-09-25 ENCOUNTER — Telehealth: Payer: Self-pay | Admitting: Physician Assistant

## 2019-09-25 ENCOUNTER — Other Ambulatory Visit: Payer: Self-pay | Admitting: Physician Assistant

## 2019-09-25 MED ORDER — DAYVIGO 10 MG PO TABS: 10.0000 mg | ORAL_TABLET | Freq: Every evening | ORAL | 0 refills | Status: DC | PRN

## 2019-09-25 NOTE — Telephone Encounter (Signed)
I sent in the Dayvigo 10 mg to [facility] on [address], in order to initiate the PA.  In the meantime, she can double her dose of 5 mg nightly as needed.

## 2019-09-25 NOTE — Telephone Encounter (Signed)
Patient called and said that the dayvigo 5 mg doesn't work at all and she would like to go up on the dose. Please let her know if that can happen. She said that it will need a PA for the higher dose

## 2019-09-25 NOTE — Progress Notes (Signed)
TY

## 2019-09-25 NOTE — Telephone Encounter (Signed)
Tried to reach patient with information but no answer and mail box full

## 2019-09-29 DIAGNOSIS — M47812 Spondylosis without myelopathy or radiculopathy, cervical region: Secondary | ICD-10-CM | POA: Diagnosis not present

## 2019-09-29 DIAGNOSIS — M546 Pain in thoracic spine: Secondary | ICD-10-CM | POA: Diagnosis not present

## 2019-09-29 DIAGNOSIS — Z981 Arthrodesis status: Secondary | ICD-10-CM | POA: Diagnosis not present

## 2019-09-30 ENCOUNTER — Other Ambulatory Visit: Payer: Self-pay | Admitting: Orthopedic Surgery

## 2019-09-30 DIAGNOSIS — M47812 Spondylosis without myelopathy or radiculopathy, cervical region: Secondary | ICD-10-CM

## 2019-10-01 ENCOUNTER — Telehealth: Payer: Self-pay

## 2019-10-01 DIAGNOSIS — M961 Postlaminectomy syndrome, not elsewhere classified: Secondary | ICD-10-CM | POA: Diagnosis not present

## 2019-10-01 DIAGNOSIS — M5416 Radiculopathy, lumbar region: Secondary | ICD-10-CM | POA: Diagnosis not present

## 2019-10-01 NOTE — Telephone Encounter (Signed)
Patient returned my call for me to screen her medications and drug allergies before being scheduled for a cervical myelogram.  She states an understanding she will be here two hours, will need a driver and will need to be on strict bedrest (explained) for 24 hours after the procedure.  She also states an understanding to hold Buspar, Risperdal and Zoloft for 48 hours before, and 24 hours after, the myelogram.

## 2019-10-07 ENCOUNTER — Ambulatory Visit
Admission: RE | Admit: 2019-10-07 | Discharge: 2019-10-07 | Disposition: A | Discharge status: Home / Self Care | Payer: Medicare HMO | Source: Ambulatory Visit | Attending: Orthopedic Surgery | Admitting: Orthopedic Surgery

## 2019-10-07 ENCOUNTER — Other Ambulatory Visit: Payer: Self-pay

## 2019-10-07 VITALS — BP 134/94 | HR 85

## 2019-10-07 DIAGNOSIS — M5021 Other cervical disc displacement,  high cervical region: Secondary | ICD-10-CM | POA: Diagnosis not present

## 2019-10-07 DIAGNOSIS — M542 Cervicalgia: Secondary | ICD-10-CM | POA: Diagnosis not present

## 2019-10-07 DIAGNOSIS — M47812 Spondylosis without myelopathy or radiculopathy, cervical region: Secondary | ICD-10-CM

## 2019-10-07 DIAGNOSIS — M50223 Other cervical disc displacement at C6-C7 level: Secondary | ICD-10-CM | POA: Diagnosis not present

## 2019-10-07 MED ORDER — DIAZEPAM 5 MG PO TABS
10.0000 mg | ORAL_TABLET | Freq: Once | ORAL | Status: AC
  Administered 2019-10-07: 10 mg via ORAL

## 2019-10-07 MED ORDER — IOPAMIDOL (ISOVUE-M 300) INJECTION 61%
10.0000 mL | Freq: Once | INTRAMUSCULAR | Status: AC | PRN
  Administered 2019-10-07: 10 mL via INTRATHECAL

## 2019-10-07 NOTE — Progress Notes (Signed)
Patient states she has been off Buspar, Risperdal and Zoloft for at least the past two days.

## 2019-10-07 NOTE — Discharge Instructions (Signed)
Myelogram Discharge Instructions  1. Go home and rest quietly for the next 24 hours.  It is important to lie flat for the next 24 hours.  Get up only to go to the restroom.  You may lie in the bed or on a couch on your back, your stomach, your left side or your right side.  You may have one pillow under your head.  You may have pillows between your knees while you are on your side or under your knees while you are on your back.  2. DO NOT drive today.  Recline the seat as far back as it will go, while still wearing your seat belt, on the way home.  3. You may get up to go to the bathroom as needed.  You may sit up for 10 minutes to eat.  You may resume your normal diet and medications unless otherwise indicated.  Drink plenty of extra fluids today and tomorrow.  4. The incidence of a spinal headache with nausea and/or vomiting is about 5% (one in 20 patients).  If you develop a headache, lie flat and drink plenty of fluids until the headache goes away.  Caffeinated beverages may be helpful.  If you develop severe nausea and vomiting or a headache that does not go away with flat bed rest, call [phone].  5. You may resume normal activities after your 24 hours of bed rest is over; however, do not exert yourself strongly or do any heavy lifting tomorrow.  6. Call your physician for a follow-up appointment.    You may resume Buspar, Risperdal and Zoloft on Thursday, October 08, 2019 after 1:00p.m.

## 2019-10-09 ENCOUNTER — Other Ambulatory Visit: Payer: Self-pay | Admitting: Physician Assistant

## 2019-10-09 DIAGNOSIS — G4709 Other insomnia: Secondary | ICD-10-CM

## 2019-10-12 ENCOUNTER — Ambulatory Visit: Payer: Medicaid Other | Admitting: Physician Assistant

## 2019-10-12 DIAGNOSIS — M47812 Spondylosis without myelopathy or radiculopathy, cervical region: Secondary | ICD-10-CM | POA: Diagnosis not present

## 2019-10-15 ENCOUNTER — Telehealth: Payer: Self-pay | Admitting: Physician Assistant

## 2019-10-15 NOTE — Telephone Encounter (Signed)
If 5mg  didn't help, then, I'm not going to Rx it. If she has a pill cutter, she can try taking 5 mg 1.5 pills, so I'll send in a few for that, if she'd like to try.

## 2019-10-15 NOTE — Telephone Encounter (Signed)
Called 07/09, reported dayvigo 5 mg did nothing for her, dose increased to 10 mg. Reporting it's too strong asking to go back to 5 mg.

## 2019-10-15 NOTE — Telephone Encounter (Signed)
Raney called to report that the increase in the [facility] to 10mg  is too strong.  She is just tired all the time; wants to sleep all day.  Wants to go back to 5mg .

## 2019-10-16 ENCOUNTER — Other Ambulatory Visit: Payer: Self-pay | Admitting: Physician Assistant

## 2019-10-16 MED ORDER — DAYVIGO 5 MG PO TABS: 5.0000 mg | ORAL_TABLET | Freq: Every evening | ORAL | 0 refills | Status: DC | PRN

## 2019-10-16 NOTE — Telephone Encounter (Signed)
Prescription was sent

## 2019-10-21 DIAGNOSIS — M961 Postlaminectomy syndrome, not elsewhere classified: Secondary | ICD-10-CM | POA: Diagnosis not present

## 2019-10-21 DIAGNOSIS — R69 Illness, unspecified: Secondary | ICD-10-CM | POA: Diagnosis not present

## 2019-10-21 DIAGNOSIS — M5416 Radiculopathy, lumbar region: Secondary | ICD-10-CM | POA: Diagnosis not present

## 2019-10-21 DIAGNOSIS — G894 Chronic pain syndrome: Secondary | ICD-10-CM | POA: Diagnosis not present

## 2019-10-29 DIAGNOSIS — Z711 Person with feared health complaint in whom no diagnosis is made: Secondary | ICD-10-CM | POA: Diagnosis not present

## 2019-10-30 ENCOUNTER — Telehealth: Payer: Self-pay | Admitting: Physician Assistant

## 2019-10-30 ENCOUNTER — Encounter: Payer: Self-pay | Admitting: Physician Assistant

## 2019-10-30 ENCOUNTER — Telehealth (INDEPENDENT_AMBULATORY_CARE_PROVIDER_SITE_OTHER): Payer: Medicare HMO | Admitting: Physician Assistant

## 2019-10-30 ENCOUNTER — Telehealth: Payer: Self-pay | Admitting: Cardiovascular Disease

## 2019-10-30 DIAGNOSIS — G4700 Insomnia, unspecified: Secondary | ICD-10-CM | POA: Diagnosis not present

## 2019-10-30 DIAGNOSIS — F39 Unspecified mood [affective] disorder: Secondary | ICD-10-CM

## 2019-10-30 DIAGNOSIS — F411 Generalized anxiety disorder: Secondary | ICD-10-CM | POA: Diagnosis not present

## 2019-10-30 DIAGNOSIS — G4709 Other insomnia: Secondary | ICD-10-CM

## 2019-10-30 DIAGNOSIS — R69 Illness, unspecified: Secondary | ICD-10-CM | POA: Diagnosis not present

## 2019-10-30 DIAGNOSIS — L819 Disorder of pigmentation, unspecified: Secondary | ICD-10-CM

## 2019-10-30 DIAGNOSIS — F17200 Nicotine dependence, unspecified, uncomplicated: Secondary | ICD-10-CM

## 2019-10-30 MED ORDER — SERTRALINE HCL 100 MG PO TABS: 200.0000 mg | ORAL_TABLET | Freq: Every day | ORAL | 0 refills | Status: DC

## 2019-10-30 MED ORDER — HYDROXYZINE PAMOATE 25 MG PO CAPS: ORAL_CAPSULE | ORAL | 0 refills | Status: DC

## 2019-10-30 MED ORDER — ZALEPLON 10 MG PO CAPS: ORAL_CAPSULE | ORAL | 0 refills | Status: DC

## 2019-10-30 MED ORDER — ALPRAZOLAM 1 MG PO TABS: 1.0000 mg | ORAL_TABLET | Freq: Three times a day (TID) | ORAL | 1 refills | Status: DC | PRN

## 2019-10-30 MED ORDER — MELATONIN 3-10 MG PO TABS: 3.0000 mg | ORAL_TABLET | Freq: Every evening | ORAL | 0 refills | Status: DC | PRN

## 2019-10-30 MED ORDER — RISPERIDONE 2 MG PO TABS: 2.0000 mg | ORAL_TABLET | Freq: Every day | ORAL | 0 refills | Status: DC

## 2019-10-30 NOTE — Telephone Encounter (Signed)
New Message:     Pt says her legs and feet are red, there is no swelling.

## 2019-10-30 NOTE — Telephone Encounter (Signed)
[name], fuchs are scheduled for a virtual visit with your provider today.    Just as we do with appointments in the office, we must obtain your consent to participate.  Your consent will be active for this visit and any virtual visit you may have with one of our providers in the next 365 days.    If you have a MyChart account, I can also send a copy of this consent to you electronically.  All virtual visits are billed to your insurance company just like a traditional visit in the office.  As this is a virtual visit, video technology does not allow for your provider to perform a traditional examination.  This may limit your provider's ability to fully assess your condition.  If your provider identifies any concerns that need to be evaluated in person or the need to arrange testing such as labs, EKG, etc, we will make arrangements to do so.    Although advances in technology are sophisticated, we cannot ensure that it will always work on either your end or our end.  If the connection with a video visit is poor, we may have to switch to a telephone visit.  With either a video or telephone visit, we are not always able to ensure that we have a secure connection.   I need to obtain your verbal consent now.   Are you willing to proceed with your visit today?   [name] has provided verbal consent on 10/30/2019 for a virtual visit (video or telephone).   Donnal Moat, PA-C 10/30/2019  10:07 AM

## 2019-10-30 NOTE — Telephone Encounter (Signed)
Left detailed message with MD recommendations. ABI ordered

## 2019-10-30 NOTE — Telephone Encounter (Signed)
Returned call to patient of Dr. Sallyanne Kuster - for the last week, bilateral feet/legs are red (entire leg). Denies numbness, tingling, loss of sensation, denies swelling & shortness of breath. She reports a slight pain in legs when walking that resolves with rest.. but states her legs are restless at night when lying down. When she touches her legs, the skin turns white. She endorses weight gain of 15lbs in the last few months.   Advised will notify Dr. Sallyanne Kuster for any recommendations and follow up

## 2019-10-30 NOTE — Progress Notes (Signed)
[facility]  Patient ID: [name],  MRN: [id number]  PCP: System, Pcp Not In  Date of Evaluation: 10/30/2019 Time spent:30 minutes  Chief Complaint:  Chief Complaint    Insomnia     Virtual Visit via Telehealth  I connected with patient by a video enabled telemedicine application with their informed consent, and verified patient privacy and that I am speaking with the correct person using two identifiers.  I am private, in my office and the patient is at home.  I discussed the limitations, risks, security and privacy concerns of performing an evaluation and management service by video and the availability of in person appointments. I also discussed with the patient that there may be a patient responsible charge related to this service. The patient expressed understanding and agreed to proceed.   I discussed the assessment and treatment plan with the patient. The patient was provided an opportunity to ask questions and all were answered. The patient agreed with the plan and demonstrated an understanding of the instructions.   The patient was advised to call back or seek an in-person evaluation if the symptoms worsen or if the condition fails to improve as anticipated.  I provided 30 minutes of non-face-to-face time during this encounter.  HISTORY/CURRENT STATUS: HPI For routine f/u.  States the [place] did not help at all.  That has since been discontinued.  She restarted the mirtazapine 45 mg and she has been taking 2 pills along with Xanax 2 mg at bedtime.  That did help initially but now that she has been taking it for 2 weeks, it is not working as well at all.  She has trouble falling asleep and staying asleep.  She has had a problem with insomnia for years.  She has tried many different meds without improvement.  States the anxiety is pretty well controlled overall.  She is having some health problems which does make her anxious.  See review of systems.  But  overall she feels like her meds are helping a lot.  Patient denies loss of interest in usual activities and is able to enjoy things.  Denies decreased energy or motivation.  Appetite has not changed.  No extreme sadness, tearfulness, or feelings of hopelessness.  Denies any changes in concentration, making decisions or remembering things.  Denies suicidal or homicidal thoughts.  Patient denies increased energy with decreased need for sleep, no increased talkativeness, no racing thoughts, no impulsivity or risky behaviors, no increased spending, no increased libido, no grandiosity, no increased irritability or anger, and no hallucinations.  No paranoia.  Denies dizziness, syncope, seizures, numbness, tingling, tremor, tics, unsteady gait, slurred speech, and specifically denies confusion and memory loss.  Denies muscle or joint pain, stiffness, or dystonia.  Individual Medical History/ Review of Systems: Changes? :Yes   Since last visit, had CP went to ER didn't feel like it was heart related, she left AMA. 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  She will be having injections and physical therapy. Is also having redness in legs bilaterally.  She saw her PCP who said her pulses were okay and there was nothing serious going on.  She plans to see her heart doctor about this to get a second opinion.  Past medications for mental health diagnoses include: Trazodone, Belsomra, Risperdal, Zoloft, hydroxyzine, mirtazapine, Xanax, Valium, Wellbutrin, Abilify, gabapentin, Strattera, Vyvanse, Ambien, Lunesta, Dayvigo didn't help, Latuda  Allergies: Penicillins  Current Medications:  Current Outpatient Medications:  .  ALPRAZolam (XANAX) 1 MG tablet, Take 1 tablet (1 mg total) by mouth 3 (three) times daily as needed for anxiety., Disp: 90 tablet, Rfl: 1 .  busPIRone (BUSPAR) 10 MG tablet, TAKE 1 TABLET BY MOUTH THREE TIMES A DAY, Disp: 270 tablet, Rfl:  0 .  hydrOXYzine (VISTARIL) 25 MG capsule, TAKE 1 CAPSULE (25 MG TOTAL) BY MOUTH 3 (THREE) TIMES DAILY AS NEEDED, may take 50-100 mg qhs prn sleep., Disp: 270 capsule, Rfl: 0 .  mirtazapine (REMERON) 15 MG tablet, Take 15 mg by mouth at bedtime., Disp: , Rfl:  .  risperiDONE (RISPERDAL) 2 MG tablet, Take 1 tablet (2 mg total) by mouth at bedtime., Disp: 90 tablet, Rfl: 0 .  sertraline (ZOLOFT) 100 MG tablet, Take 2 tablets (200 mg total) by mouth at bedtime., Disp: 180 tablet, Rfl: 0 .  thiamine (VITAMIN B-1) 100 MG tablet, Take 100 mg by mouth daily., Disp: , Rfl:  .  Melatonin 3-10 MG TABS, Take 3-10 mg by mouth at bedtime as needed., Disp: 30 tablet, Rfl: 0 .  zaleplon (SONATA) 10 MG capsule, 1 po qhs prn sleep, and may repeat 1 for midnocturnal awakening as long as there are 3 hours left to sleep, Disp: 60 capsule, Rfl: 0 Medication Side Effects: none  Family Medical/ Social History: Changes? No  MENTAL HEALTH EXAM:  There were no vitals taken for this visit.There is no height or weight on file to calculate BMI.  General Appearance: Casual, Neat and Well Groomed  Eye Contact:  Good  Speech:  Clear and Coherent and Normal Rate  Volume:  Normal  Mood:  Euthymic  Affect:  Appropriate  Thought Process:  Goal Directed and Descriptions of Associations: Intact  Orientation:  Full (Time, Place, and Person)  Thought Content: Logical   Suicidal Thoughts:  No  Homicidal Thoughts:  No  Memory:  WNL  Judgement:  Good  Insight:  Fair  Psychomotor Activity:  Normal  Concentration:  Concentration: Good and Attention Span: Good  Recall:  Good  Fund of Knowledge: Good  Language: Good  Assets:  Desire for Improvement  ADL's:  Intact  Cognition: WNL  Prognosis:  Fair    DIAGNOSES:    ICD-10-CM   1. Insomnia, unspecified type  G47.00 risperiDONE (RISPERDAL) 2 MG tablet  2. Other insomnia  G47.09 hydrOXYzine (VISTARIL) 25 MG capsule    risperiDONE (RISPERDAL) 2 MG tablet  3. Generalized  anxiety disorder  F41.1 risperiDONE (RISPERDAL) 2 MG tablet    sertraline (ZOLOFT) 100 MG tablet  4. Episodic mood disorder (HCC)  F39   5. Smoker  F17.200     Receiving Psychotherapy: No    RECOMMENDATIONS:  PDMP was reviewed. I provided 30 minutes of nonface-to-face time during this encounter, including review of ER note from 09/17/2019 and other progress notes. Reminded her not to start or stop medications without discussing with me.  She is on way too much mirtazapine.  She needs to take no more than 15 mg for sleep.  I will wean her down from that, but not completely off until the next visit. Sleep hygiene was discussed again. Caffeine is not an issue.  We discussed Sonata, benefits, risks, and side effects and she would like to try it. She can take as much as the Sonata, melatonin up to 10 mg, Xanax 1 mg and hydroxyzine 50 to 100 mg nightly for sleep.  She should try lower doses on the hydroxyzine and melatonin first  and then go up as needed.  If she does not need that entire cocktail, then do not take it.  She verbalizes understanding. Wean mirtazapine 45 mg, 1 p.o. nightly for 1 week and then one half p.o. nightly.  We will continue to wean further in the future. Start melatonin 3 to 10 mg p.o. nightly as needed. Discontinue Dayvigo. Start Sonata 10 mg, 1 p.o. nightly as needed and may repeat 1 for mid nocturnal awakening as needed but only if she has at least 3 hours left to sleep. Continue Xanax 1 mg, 1 p.o. 3 times daily as needed. Continue Zoloft 100 mg, 2 p.o. nightly. Continue Risperdal 2 mg nightly. Continue hydroxyzine 25 mg, 1 p.o. 3 times daily as needed, and may take 50 to 100 mg p.o. nightly as needed.  Keep it at the lowest possible dose. Continue Buspar 10 mg tid. Continue thiamine. Recommend counseling. Return in 4-6 weeks.  Donnal Moat, PA-C

## 2019-10-30 NOTE — Telephone Encounter (Signed)
Probably not a vascular issue, but I would suggest scheduling for ABI (and recommend quitting smoking).

## 2019-10-31 ENCOUNTER — Other Ambulatory Visit: Payer: Self-pay | Admitting: Physician Assistant

## 2019-11-05 DIAGNOSIS — M542 Cervicalgia: Secondary | ICD-10-CM | POA: Diagnosis not present

## 2019-11-06 ENCOUNTER — Encounter (HOSPITAL_COMMUNITY): Payer: Self-pay

## 2019-11-06 ENCOUNTER — Other Ambulatory Visit: Payer: Self-pay

## 2019-11-06 DIAGNOSIS — R42 Dizziness and giddiness: Secondary | ICD-10-CM | POA: Diagnosis not present

## 2019-11-06 DIAGNOSIS — R110 Nausea: Secondary | ICD-10-CM | POA: Diagnosis not present

## 2019-11-06 DIAGNOSIS — K8020 Calculus of gallbladder without cholecystitis without obstruction: Secondary | ICD-10-CM | POA: Insufficient documentation

## 2019-11-06 DIAGNOSIS — F909 Attention-deficit hyperactivity disorder, unspecified type: Secondary | ICD-10-CM | POA: Diagnosis not present

## 2019-11-06 DIAGNOSIS — R140 Abdominal distension (gaseous): Secondary | ICD-10-CM | POA: Insufficient documentation

## 2019-11-06 DIAGNOSIS — K429 Umbilical hernia without obstruction or gangrene: Secondary | ICD-10-CM | POA: Insufficient documentation

## 2019-11-06 DIAGNOSIS — C539 Malignant neoplasm of cervix uteri, unspecified: Secondary | ICD-10-CM | POA: Insufficient documentation

## 2019-11-06 DIAGNOSIS — K760 Fatty (change of) liver, not elsewhere classified: Secondary | ICD-10-CM | POA: Diagnosis not present

## 2019-11-06 DIAGNOSIS — F17210 Nicotine dependence, cigarettes, uncomplicated: Secondary | ICD-10-CM | POA: Insufficient documentation

## 2019-11-06 DIAGNOSIS — I700 Atherosclerosis of aorta: Secondary | ICD-10-CM | POA: Diagnosis not present

## 2019-11-06 DIAGNOSIS — Z79899 Other long term (current) drug therapy: Secondary | ICD-10-CM | POA: Insufficient documentation

## 2019-11-06 DIAGNOSIS — R69 Illness, unspecified: Secondary | ICD-10-CM | POA: Diagnosis not present

## 2019-11-06 DIAGNOSIS — Z950 Presence of cardiac pacemaker: Secondary | ICD-10-CM | POA: Insufficient documentation

## 2019-11-06 DIAGNOSIS — N838 Other noninflammatory disorders of ovary, fallopian tube and broad ligament: Secondary | ICD-10-CM | POA: Diagnosis not present

## 2019-11-06 LAB — CBC
HCT: 48.7 % — ABNORMAL HIGH (ref 36.0–46.0)
Hemoglobin: 15.5 g/dL — ABNORMAL HIGH (ref 12.0–15.0)
MCH: 28.7 pg (ref 26.0–34.0)
MCHC: 31.8 g/dL (ref 30.0–36.0)
MCV: 90.2 fL (ref 80.0–100.0)
Platelets: 342 10*3/uL (ref 150–400)
RBC: 5.4 MIL/uL — ABNORMAL HIGH (ref 3.87–5.11)
RDW: 15.1 % (ref 11.5–15.5)
WBC: 10.4 10*3/uL (ref 4.0–10.5)
nRBC: 0 % (ref 0.0–0.2)

## 2019-11-06 LAB — COMPREHENSIVE METABOLIC PANEL
ALT: 18 U/L (ref 0–44)
AST: 19 U/L (ref 15–41)
Albumin: 4.4 g/dL (ref 3.5–5.0)
Alkaline Phosphatase: 71 U/L (ref 38–126)
Anion gap: 10 (ref 5–15)
BUN: 9 mg/dL (ref 8–23)
CO2: 24 mmol/L (ref 22–32)
Calcium: 9.8 mg/dL (ref 8.9–10.3)
Chloride: 108 mmol/L (ref 98–111)
Creatinine, Ser: 0.8 mg/dL (ref 0.44–1.00)
GFR calc Af Amer: >60 mL/min (ref >60)
GFR calc non Af Amer: >60 mL/min (ref >60)
Glucose, Bld: 103 mg/dL — ABNORMAL HIGH (ref 70–99)
Potassium: 4.3 mmol/L (ref 3.5–5.1)
Sodium: 142 mmol/L (ref 135–145)
Total Bilirubin: 0.7 mg/dL (ref 0.3–1.2)
Total Protein: 7.3 g/dL (ref 6.5–8.1)

## 2019-11-06 LAB — LIPASE, BLOOD: Lipase: 28 U/L (ref 11–51)

## 2019-11-06 NOTE — ED Triage Notes (Signed)
Arrived by EMS from PCP. Patient sent by PCP for further evaluation of nausea without vomiting and feeling of abdominal fullness X3 days. Patient reports she gained 15 pounds in last 4 weeks

## 2019-11-07 ENCOUNTER — Emergency Department (HOSPITAL_COMMUNITY): Payer: Medicare HMO

## 2019-11-07 ENCOUNTER — Emergency Department (HOSPITAL_COMMUNITY)
Admission: EM | Admit: 2019-11-07 | Discharge: 2019-11-07 | Disposition: A | Discharge status: Home / Self Care | Payer: Medicare HMO | Attending: Emergency Medicine | Admitting: Emergency Medicine

## 2019-11-07 ENCOUNTER — Encounter (HOSPITAL_COMMUNITY): Payer: Self-pay

## 2019-11-07 DIAGNOSIS — K429 Umbilical hernia without obstruction or gangrene: Secondary | ICD-10-CM | POA: Diagnosis not present

## 2019-11-07 DIAGNOSIS — R140 Abdominal distension (gaseous): Secondary | ICD-10-CM

## 2019-11-07 DIAGNOSIS — R110 Nausea: Secondary | ICD-10-CM

## 2019-11-07 DIAGNOSIS — N838 Other noninflammatory disorders of ovary, fallopian tube and broad ligament: Secondary | ICD-10-CM | POA: Diagnosis not present

## 2019-11-07 DIAGNOSIS — K8020 Calculus of gallbladder without cholecystitis without obstruction: Secondary | ICD-10-CM | POA: Diagnosis not present

## 2019-11-07 DIAGNOSIS — I700 Atherosclerosis of aorta: Secondary | ICD-10-CM | POA: Diagnosis not present

## 2019-11-07 MED ORDER — IOHEXOL 300 MG/ML  SOLN
100.0000 mL | Freq: Once | INTRAMUSCULAR | Status: AC | PRN
  Administered 2019-11-07: 100 mL via INTRAVENOUS

## 2019-11-07 MED ORDER — SODIUM CHLORIDE 0.9 % IV BOLUS
1000.0000 mL | Freq: Once | INTRAVENOUS | Status: AC
  Administered 2019-11-07: 1000 mL via INTRAVENOUS

## 2019-11-07 MED ORDER — ONDANSETRON 8 MG PO TBDP
ORAL_TABLET | ORAL | 0 refills | Status: DC
Start: 2019-11-07 — End: 2020-01-04

## 2019-11-07 MED ORDER — ONDANSETRON HCL 4 MG/2ML IJ SOLN
4.0000 mg | Freq: Once | INTRAMUSCULAR | Status: AC
  Administered 2019-11-07: 4 mg via INTRAVENOUS
  Filled 2019-11-07: qty 2

## 2019-11-07 MED ORDER — SODIUM CHLORIDE (PF) 0.9 % IJ SOLN
INTRAMUSCULAR | Status: AC
  Filled 2019-11-07: qty 50

## 2019-11-07 NOTE — ED Provider Notes (Signed)
[facility] DEPT Provider Note   CSN: [id number] Arrival date & time: 11/06/19  1911     History Chief Complaint  Patient presents with  . Nausea    [name] is a 64 y.o. female.  Patient is a 64 year old female with past medical history of anxiety, bipolar, depression, pacemaker placement.  She presents today for evaluation of abdominal distention.  Patient states that she has been very nauseated for the past several days and feels as though her abdomen is more bloated than normal.  She was seen by her primary care provider, then sent here by EMS for further evaluation of the above complaints.  They are concerned about malignancy.  Patient denies she is having any black or bloody stools.  She denies any fevers or chills.  The history is provided by the patient.       Past Medical History:  Diagnosis Date  . Anxiety    anxiety and mild depressive order systoms  . Bipolar disorder ([place])     (02/20/2013)  . Cervical cancer ([place]) 03/19/1980  . Depression   . Dysrhythmia   . JIRCVELF(810.1)    "monthly" (02/20/2013)  . Pacemaker    medtronic  . PONV (postoperative nausea and vomiting)   . Sinus pause 02/14/2013  . Syncope and collapse    echo-April 12,2012-EF 55% Echo normal; recorder with prolonged sinus pauses --> status post  . Tobacco abuse   . Vertigo     Patient Active Problem List   Diagnosis Date Noted  . Cervical spondylosis 09/23/2019  . Bilateral elbow joint pain 08/26/2019  . Memory loss 07/02/2019  . Depression with anxiety 07/02/2019  . B12 deficiency 07/02/2019  . Lumbar radiculopathy 02/10/2019  . History of fusion of lumbar spine 02/10/2019  . Dysesthesia 02/10/2019  . Retroperitoneal fibrosis   . Pelvic adhesions   . Left ovarian cyst 11/17/2018  . Elevated tumor markers 11/17/2018  . Dyspnea 09/15/2018  . Chest pain of uncertain etiology 75/12/2583  . Laryngopharyngeal reflux (LPR) 05/19/2018  . Mood  disorder ([place]) 04/16/2018  . Insomnia 04/16/2018  . Attention deficit hyperactivity disorder (ADHD) 04/16/2018  . Neck pain on right side 09/09/2014  . 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 Family history of arrhythmogenic right ventricular cardiomyopathy 03/30/2013  . 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 02/20/2013  . Sinus pause 02/14/2013  . Near syncope 10/29/2012  . Hx of syncope- s/p Loop recorder 11/06/12 10/29/2012  . Vertigo 10/29/2012  . Tobacco abuse 10/29/2012  . ANXIETY 11/14/2009  . CERUMEN IMPACTION, RIGHT 11/14/2009  . BENIGN POSITIONAL VERTIGO 11/14/2009  . EAR PAIN, RIGHT 11/14/2009  . SCIATICA 05/09/2009    Past Surgical History:  Procedure Laterality Date  . ABDOMINAL HYSTERECTOMY  03/19/1980   "partial"  . BACK SURGERY    . CERVICAL FUSION Left 11/2013   C4-C6  . CERVICAL FUSION     3,4,5,  . INSERT / REPLACE / REMOVE PACEMAKER  02/20/2013   medtronic  . LOOP RECORDER IMPLANT N/A 11/06/2012   Procedure: LINQ LOOP RECORDER IMPLANT;  Surgeon: Sanda Klein, MD;  Location: [facility] CATH LAB;  Service: Cardiovascular;  Laterality: N/A;  . NECK SURGERY  11/17/2013  . PACEMAKER PLACEMENT N/A 02/16/2013  . PERMANENT PACEMAKER INSERTION N/A 02/20/2013   Procedure: PERMANENT PACEMAKER INSERTION;  Surgeon: Sanda Klein, MD;  Location: [facility] CATH [facility];  Service: Cardiovascular;  Laterality: N/A;  . POSTERIOR LUMBAR FUSION  ~ 2009  . ROBOTIC ASSISTED SALPINGO OOPHERECTOMY  Bilateral 11/25/2018   Procedure: XI ROBOTIC ASSISTED BILATERAL SALPINGO OOPHORECTOMY and LYSIS OF ADHESIONS.;  Surgeon: Everitt Amber, MD;  Location: [facility];  Service: Gynecology;  Laterality: Bilateral;  . SPINAL FUSION  03/19/2014  . TONSILLECTOMY    . TRANSTHORACIC ECHOCARDIOGRAM  07/11/2010   The left atrial size is normal.There is no evidence of mitral vavle prolaspe.Right ventriicular systolic pressure is normal . Injection of contrast documented no interatrial shunt. Essentially  normal 2D echo -doppler study      OB History   No obstetric history on file.     Family History  Adopted: Yes  Problem Relation Age of Onset  . Heart attack Brother     Social History   Tobacco Use  . Smoking status: Current Every Day Smoker    Packs/day: 0.00    Years: 40.00    Pack years: 0.00    Types: Cigarettes  . Smokeless tobacco: Never Used  . Tobacco comment: 14 cigarette per day  Vaping Use  . Vaping Use: Never used  Substance Use Topics  . Alcohol use: No  . Drug use: No    Home Medications Prior to Admission medications   Medication Sig Start Date End Date Taking? Authorizing Provider  cyclobenzaprine (FLEXERIL) 10 MG tablet Take 10 mg by mouth at bedtime. 10/27/19  Yes [provider]  omeprazole (PRILOSEC) 40 MG capsule Take 40 mg by mouth 2 (two) times daily. 10/09/19  Yes [provider]  ALPRAZolam Duanne Moron) 1 MG tablet Take 1 tablet (1 mg total) by mouth 3 (three) times daily as needed for anxiety. 10/30/19   Donnal Moat T, PA-C  busPIRone (BUSPAR) 10 MG tablet TAKE 1 TABLET BY MOUTH THREE TIMES A DAY 11/02/19   Hurst, Helene Kelp T, PA-C  hydrOXYzine (VISTARIL) 25 MG capsule TAKE 1 CAPSULE (25 MG TOTAL) BY MOUTH 3 (THREE) TIMES DAILY AS NEEDED, may take 50-100 mg qhs prn sleep. 10/30/19   Addison Lank, PA-C  Melatonin 3-10 MG TABS Take 3-10 mg by mouth at bedtime as needed. 10/30/19   Donnal Moat T, PA-C  mirtazapine (REMERON) 15 MG tablet Take 15 mg by mouth at bedtime.    [provider]  risperiDONE (RISPERDAL) 2 MG tablet Take 1 tablet (2 mg total) by mouth at bedtime. 10/30/19   Donnal Moat T, PA-C  sertraline (ZOLOFT) 100 MG tablet Take 2 tablets (200 mg total) by mouth at bedtime. 10/30/19   Donnal Moat T, PA-C  thiamine (VITAMIN B-1) 100 MG tablet Take 100 mg by mouth daily.    [provider]  zaleplon (SONATA) 10 MG capsule 1 po qhs prn sleep, and may repeat 1 for midnocturnal awakening as long as there are 3  hours left to sleep 10/30/19   Donnal Moat T, PA-C    Allergies    Penicillins  Review of Systems   Review of Systems  All other systems reviewed and are negative.   Physical Exam Updated Vital Signs BP (!) 140/99 (BP Location: Right Arm)   Pulse (!) 102   Temp 99 F (37.2 C) (Oral)   Resp 18   Ht 5\' 7"  (1.702 m)   Wt 62.6 kg   SpO2 99%   BMI 21.61 kg/m   Physical Exam Vitals and nursing note reviewed.  Constitutional:      General: She is not in acute distress.    Appearance: She is well-developed. She is not diaphoretic.  HENT:     Head: Normocephalic and atraumatic.  Cardiovascular:     Rate and Rhythm: Normal rate and regular rhythm.     Heart sounds: No murmur heard.  No friction rub. No gallop.   Pulmonary:     Effort: Pulmonary effort is normal. No respiratory distress.     Breath sounds: Normal breath sounds. No wheezing.  Abdominal:     General: Bowel sounds are normal. There is no distension.     Palpations: Abdomen is soft.     Tenderness: There is abdominal tenderness. There is no guarding or rebound.  Musculoskeletal:        General: Normal range of motion.     Cervical back: Normal range of motion and neck supple.  Skin:    General: Skin is warm and dry.  Neurological:     Mental Status: She is alert and oriented to person, place, and time.     ED Results / Procedures / Treatments   Labs (all labs ordered are listed, but only abnormal results are displayed) Labs Reviewed  COMPREHENSIVE METABOLIC PANEL - Abnormal; Notable for the following components:      Result Value   Glucose, Bld 103 (*)    All other components within normal limits  CBC - Abnormal; Notable for the following components:   RBC 5.40 (*)    Hemoglobin 15.5 (*)    HCT 48.7 (*)    All other components within normal limits  LIPASE, BLOOD  URINALYSIS, ROUTINE W REFLEX MICROSCOPIC    EKG None  Radiology No results found.  Procedures Procedures (including critical  care time)  Medications Ordered in ED Medications  sodium chloride 0.9 % bolus 1,000 mL (has no administration in time range)  ondansetron (ZOFRAN) injection 4 mg (has no administration in time range)    ED Course  I have reviewed the triage vital signs and the nursing notes.  Pertinent labs & imaging results that were available during my care of the patient were reviewed by me and considered in my medical decision making (see chart for details).  Clinical Course as of Nov 07 4  Sat Nov 07, 2019  2876 IMPRESSION: 1. No cause for the patient's nausea identified. No cause for weight gain identified. 2. Platelike opacity in left base consistent with atelectasis. 3. Mild cholelithiasis without wall thickening or pericholecystic stranding. Hepatic steatosis. 4. Atherosclerosis in the nonaneurysmal aorta. 5. The left adnexal mass seen previously has been resected. 6. Small fat containing umbilical hernia. 7. Surgical changes at L4-5 are stable.   [MT]    Clinical Course User Index [MT] Trifan, Carola Rhine, MD   MDM Rules/Calculators/A&P  Patient presenting here with complaints of abdominal distention and nausea.  She also reports recent weight gain.  She was sent here by her primary doctor over concerns of ascites.  I am unable to appreciate a fluid wave in her laboratory studies and CT scan are unremarkable.  There is no evidence for ascites or malignancy.  There is also no other findings consistent with an infectious process or bowel obstruction.  Patient is feeling better after IV fluids and nausea medicine.  I feel as though discharge is appropriate.  Patient will be given Zofran and is to return as needed if symptoms worsen or change.  Final Clinical Impression(s) / ED Diagnoses Final diagnoses:  None    Rx / DC Orders ED Discharge Orders    None       Veryl Speak, MD 11/08/19 [phone]

## 2019-11-07 NOTE — Discharge Instructions (Addendum)
Begin taking Zofran as prescribed as needed for nausea.  Clear liquid diet for the next 12 hours, then slowly advance as tolerated.  Return to the emergency department if you develop worsening pain, high fever, bloody stools, or other new and concerning symptoms.  Follow-up with your primary doctor if symptoms are not improving in the next few days.

## 2019-11-07 NOTE — ED Notes (Signed)
Pt came back in from outside vitals have been updated

## 2019-11-07 NOTE — ED Provider Notes (Signed)
Patient signed out to me by Dr Stark Jock at 7:15 AM.  Briefly, this is a 64 yo female presenting to ED with several weeks of weight gain, nausea, abdominal distension, seen by PCP's office and told to come to ED today with concern for "fluid wave."  No evidence of SBP on exam.  CT scan of abdomen pending.  CMP wnl.  Alb 4.4.  No leukocytosis or fever.  Per Dr Stark Jock, anticipate discharge home if no acute emergent process identified on CT scan.  CT scan without acute findings to explain abdominal symptoms.  Patient updated on results including gallstones, without sign of acute cholecystitis.  She is asking to go home .  Okay for discharge   Wyvonnia Dusky, MD 11/07/19 [phone]

## 2019-11-10 ENCOUNTER — Other Ambulatory Visit: Payer: Self-pay | Admitting: Cardiovascular Disease

## 2019-11-10 DIAGNOSIS — R110 Nausea: Secondary | ICD-10-CM | POA: Diagnosis not present

## 2019-11-10 DIAGNOSIS — Z79899 Other long term (current) drug therapy: Secondary | ICD-10-CM | POA: Diagnosis not present

## 2019-11-10 DIAGNOSIS — L819 Disorder of pigmentation, unspecified: Secondary | ICD-10-CM

## 2019-11-17 ENCOUNTER — Telehealth: Payer: Self-pay | Admitting: Physician Assistant

## 2019-11-17 DIAGNOSIS — R140 Abdominal distension (gaseous): Secondary | ICD-10-CM | POA: Diagnosis not present

## 2019-11-17 DIAGNOSIS — R110 Nausea: Secondary | ICD-10-CM | POA: Diagnosis not present

## 2019-11-17 NOTE — Telephone Encounter (Signed)
Pt went to the dr.and they think that the sleeping pill she is on is making her sick. Zalpedon 10 mg she is nausea 24/7 and can't eat or sleep.  She went to the hospital and they couldn't find anything wrong  Please call her at [phone]

## 2019-11-18 NOTE — Telephone Encounter (Signed)
Patient called back requesting a different sleep med

## 2019-11-18 NOTE — Telephone Encounter (Signed)
It is physically impossible for Zaleplon to cause any SE 24/7.  It is in and out of the body in 3-4 hours.  Her sx are not related to Zaleplon.

## 2019-11-18 NOTE — Telephone Encounter (Signed)
Noted, sorry routed wrong provider.   Helene Kelp anything else I need to relay?

## 2019-11-18 NOTE — Telephone Encounter (Signed)
Pt called checking status on new sleep med.

## 2019-11-18 NOTE — Telephone Encounter (Signed)
ER visit 11/07/19

## 2019-11-19 NOTE — Telephone Encounter (Signed)
I have not answered her message yet because I am trying to get in touch with Dr. Paulita Fujita who called yesterday concerning her.  He and I have been playing phone tag and I have not spoken with him yet.  I want to see what he says before I make any changes on her medication.

## 2019-11-19 NOTE — Telephone Encounter (Signed)
[name] has called once again trying to get an answer on what she should do about her sleep med and can something else be prescribed. Please call her to advice.

## 2019-11-20 NOTE — Telephone Encounter (Signed)
Please review [name]'s message

## 2019-11-20 NOTE — Telephone Encounter (Signed)
Noted thank you Leda Gauze

## 2019-11-20 NOTE — Telephone Encounter (Signed)
Andersen called again.  I told her to increase her Risperdal to 3mg  qHS.  I told her she should have enough of the medication to be able to split the 2mg  in half to take 1 full 2mg  with a 1/2 tab to make 3mg .  I told her to start this tonight and report next week if it is not helping.  But this should help her and it is a medication she can tolerate and that she has available to her to take on her trip.

## 2019-11-20 NOTE — Telephone Encounter (Signed)
Let us increase the Risperdal to 3 mg.  She has already taken numerous medications for sleep that have not been effective and I think increasing the Risperdal will help with sleep, anxiety and her mood.

## 2019-11-20 NOTE — Telephone Encounter (Signed)
Netra called again.  I told her that you were trying to talk with Dr. Paulita Fujita and have been unable to do so. That you need his input before she changes your medication.  But Sherral reported that she is going out of town tomorrow for 10 days and now won't have anything to take with her.  She hasn't had anything to help with sleep for 4 days already.  Can just something be called in today to get her through vacation?

## 2019-11-26 ENCOUNTER — Ambulatory Visit: Payer: Medicare HMO

## 2019-11-30 ENCOUNTER — Telehealth: Payer: Self-pay | Admitting: Physician Assistant

## 2019-11-30 NOTE — Telephone Encounter (Signed)
Pt out of Xanax and Risperidone due to increase. Requesting refills @ [facility]. [address]

## 2019-12-01 ENCOUNTER — Other Ambulatory Visit: Payer: Self-pay

## 2019-12-01 DIAGNOSIS — G4709 Other insomnia: Secondary | ICD-10-CM

## 2019-12-01 DIAGNOSIS — G4700 Insomnia, unspecified: Secondary | ICD-10-CM

## 2019-12-01 DIAGNOSIS — F411 Generalized anxiety disorder: Secondary | ICD-10-CM

## 2019-12-01 MED ORDER — RISPERIDONE 3 MG PO TABS: 3.0000 mg | ORAL_TABLET | Freq: Every day | ORAL | 0 refills | Status: DC

## 2019-12-01 MED ORDER — ALPRAZOLAM 1 MG PO TABS: 1.0000 mg | ORAL_TABLET | Freq: Three times a day (TID) | ORAL | 1 refills | Status: DC | PRN

## 2019-12-01 NOTE — Telephone Encounter (Signed)
Risperdal 3 mg #90 sent to [facility] for Helene Kelp to send Upcoming apt 12/14/19

## 2019-12-03 DIAGNOSIS — M545 Low back pain: Secondary | ICD-10-CM | POA: Diagnosis not present

## 2019-12-03 DIAGNOSIS — M961 Postlaminectomy syndrome, not elsewhere classified: Secondary | ICD-10-CM | POA: Diagnosis not present

## 2019-12-03 LAB — CUP PACEART REMOTE DEVICE CHECK
Battery Impedance: 305 Ohm
Battery Remaining Longevity: 124 mo
Battery Voltage: 2.78 V
Brady Statistic AP VP Percent: 0 %
Brady Statistic AP VS Percent: 0 %
Brady Statistic AS VP Percent: 2 %
Brady Statistic AS VS Percent: 98 %
Date Time Interrogation Session: 20210915094603
Implantable Lead Implant Date: 20141205
Implantable Lead Implant Date: 20141205
Implantable Lead Location: 753859
Implantable Lead Location: 753860
Implantable Lead Model: 5076
Implantable Lead Model: 5076
Implantable Pulse Generator Implant Date: 20141205
Lead Channel Impedance Value: 490 Ohm
Lead Channel Impedance Value: 492 Ohm
Lead Channel Pacing Threshold Amplitude: 0.5 V
Lead Channel Pacing Threshold Amplitude: 0.875 V
Lead Channel Pacing Threshold Pulse Width: 0.4 ms
Lead Channel Pacing Threshold Pulse Width: 0.4 ms
Lead Channel Setting Pacing Amplitude: 2 V
Lead Channel Setting Pacing Amplitude: 3.75 V
Lead Channel Setting Pacing Pulse Width: 0.4 ms
Lead Channel Setting Sensing Sensitivity: 2 mV

## 2019-12-07 ENCOUNTER — Ambulatory Visit (HOSPITAL_COMMUNITY)
Admission: RE | Admit: 2019-12-07 | Discharge: 2019-12-07 | Disposition: A | Discharge status: Home / Self Care | Payer: Medicare HMO | Source: Ambulatory Visit | Attending: Internal Medicine | Admitting: Internal Medicine

## 2019-12-07 ENCOUNTER — Other Ambulatory Visit: Payer: Self-pay

## 2019-12-07 DIAGNOSIS — L819 Disorder of pigmentation, unspecified: Secondary | ICD-10-CM

## 2019-12-10 ENCOUNTER — Other Ambulatory Visit: Payer: Self-pay | Admitting: Physical Medicine and Rehabilitation

## 2019-12-10 DIAGNOSIS — M545 Low back pain, unspecified: Secondary | ICD-10-CM

## 2019-12-14 ENCOUNTER — Telehealth: Payer: Self-pay | Admitting: Physician Assistant

## 2019-12-14 ENCOUNTER — Ambulatory Visit: Payer: Medicare HMO | Admitting: Physician Assistant

## 2019-12-14 NOTE — Telephone Encounter (Signed)
Reviewed

## 2019-12-14 NOTE — Telephone Encounter (Signed)
Pt called at 2:15pm today asking if she had an appt. She did have one at 2pm. I told her TH could not still see her today. R/S for Thursday. However, patient went on to say that her daughter called the police on her last night, because her daughter was calling her around 3am and [name] didn't answer her phone. So the police came. [name] says that the police took all of her medications from her and gave them to her daughter. She's sorry she missed the appt today.

## 2019-12-15 ENCOUNTER — Emergency Department (HOSPITAL_COMMUNITY): Payer: Medicare HMO

## 2019-12-15 ENCOUNTER — Emergency Department (HOSPITAL_COMMUNITY)
Admission: EM | Admit: 2019-12-15 | Discharge: 2019-12-15 | Disposition: A | Discharge status: Home / Self Care | Payer: Medicare HMO | Source: Home / Self Care | Attending: Emergency Medicine | Admitting: Emergency Medicine

## 2019-12-15 ENCOUNTER — Other Ambulatory Visit: Payer: Self-pay

## 2019-12-15 ENCOUNTER — Encounter (HOSPITAL_COMMUNITY): Payer: Self-pay

## 2019-12-15 DIAGNOSIS — G9340 Encephalopathy, unspecified: Secondary | ICD-10-CM | POA: Diagnosis not present

## 2019-12-15 DIAGNOSIS — Z8541 Personal history of malignant neoplasm of cervix uteri: Secondary | ICD-10-CM | POA: Insufficient documentation

## 2019-12-15 DIAGNOSIS — J9811 Atelectasis: Secondary | ICD-10-CM | POA: Diagnosis not present

## 2019-12-15 DIAGNOSIS — J90 Pleural effusion, not elsewhere classified: Secondary | ICD-10-CM | POA: Diagnosis not present

## 2019-12-15 DIAGNOSIS — W19XXXA Unspecified fall, initial encounter: Secondary | ICD-10-CM | POA: Diagnosis not present

## 2019-12-15 DIAGNOSIS — F17210 Nicotine dependence, cigarettes, uncomplicated: Secondary | ICD-10-CM | POA: Insufficient documentation

## 2019-12-15 DIAGNOSIS — J189 Pneumonia, unspecified organism: Secondary | ICD-10-CM

## 2019-12-15 DIAGNOSIS — Z950 Presence of cardiac pacemaker: Secondary | ICD-10-CM | POA: Insufficient documentation

## 2019-12-15 DIAGNOSIS — R29898 Other symptoms and signs involving the musculoskeletal system: Secondary | ICD-10-CM | POA: Diagnosis not present

## 2019-12-15 DIAGNOSIS — R531 Weakness: Secondary | ICD-10-CM | POA: Insufficient documentation

## 2019-12-15 DIAGNOSIS — R410 Disorientation, unspecified: Secondary | ICD-10-CM

## 2019-12-15 DIAGNOSIS — R000 Tachycardia, unspecified: Secondary | ICD-10-CM

## 2019-12-15 DIAGNOSIS — R42 Dizziness and giddiness: Secondary | ICD-10-CM | POA: Insufficient documentation

## 2019-12-15 DIAGNOSIS — R404 Transient alteration of awareness: Secondary | ICD-10-CM | POA: Diagnosis not present

## 2019-12-15 DIAGNOSIS — Z79899 Other long term (current) drug therapy: Secondary | ICD-10-CM | POA: Insufficient documentation

## 2019-12-15 DIAGNOSIS — R55 Syncope and collapse: Secondary | ICD-10-CM | POA: Diagnosis not present

## 2019-12-15 DIAGNOSIS — R4182 Altered mental status, unspecified: Secondary | ICD-10-CM | POA: Diagnosis not present

## 2019-12-15 LAB — CBC WITH DIFFERENTIAL/PLATELET
Abs Immature Granulocytes: 0.06 10*3/uL (ref 0.00–0.07)
Basophils Absolute: 0 10*3/uL (ref 0.0–0.1)
Basophils Relative: 0 %
Eosinophils Absolute: 0 10*3/uL (ref 0.0–0.5)
Eosinophils Relative: 0 %
HCT: 46.4 % — ABNORMAL HIGH (ref 36.0–46.0)
Hemoglobin: 15.2 g/dL — ABNORMAL HIGH (ref 12.0–15.0)
Immature Granulocytes: 0 %
Lymphocytes Relative: 7 %
Lymphs Abs: 1.3 10*3/uL (ref 0.7–4.0)
MCH: 28.7 pg (ref 26.0–34.0)
MCHC: 32.8 g/dL (ref 30.0–36.0)
MCV: 87.7 fL (ref 80.0–100.0)
Monocytes Absolute: 1 10*3/uL (ref 0.1–1.0)
Monocytes Relative: 6 %
Neutro Abs: 15.5 10*3/uL — ABNORMAL HIGH (ref 1.7–7.7)
Neutrophils Relative %: 87 %
Platelets: 300 10*3/uL (ref 150–400)
RBC: 5.29 MIL/uL — ABNORMAL HIGH (ref 3.87–5.11)
RDW: 14.2 % (ref 11.5–15.5)
WBC: 17.9 10*3/uL — ABNORMAL HIGH (ref 4.0–10.5)
nRBC: 0 % (ref 0.0–0.2)

## 2019-12-15 LAB — COMPREHENSIVE METABOLIC PANEL
ALT: 43 U/L (ref 0–44)
AST: 152 U/L — ABNORMAL HIGH (ref 15–41)
Albumin: 4 g/dL (ref 3.5–5.0)
Alkaline Phosphatase: 51 U/L (ref 38–126)
Anion gap: 15 (ref 5–15)
BUN: 13 mg/dL (ref 8–23)
CO2: 18 mmol/L — ABNORMAL LOW (ref 22–32)
Calcium: 9.3 mg/dL (ref 8.9–10.3)
Chloride: 103 mmol/L (ref 98–111)
Creatinine, Ser: 1.12 mg/dL — ABNORMAL HIGH (ref 0.44–1.00)
GFR calc Af Amer: >60 mL/min (ref >60)
GFR calc non Af Amer: 53 mL/min — ABNORMAL LOW (ref >60)
Glucose, Bld: 97 mg/dL (ref 70–99)
Potassium: 4.2 mmol/L (ref 3.5–5.1)
Sodium: 136 mmol/L (ref 135–145)
Total Bilirubin: 1.9 mg/dL — ABNORMAL HIGH (ref 0.3–1.2)
Total Protein: 7.3 g/dL (ref 6.5–8.1)

## 2019-12-15 LAB — I-STAT CHEM 8, ED
BUN: 11 mg/dL (ref 8–23)
Calcium, Ion: 1.14 mmol/L — ABNORMAL LOW (ref 1.15–1.40)
Chloride: 104 mmol/L (ref 98–111)
Creatinine, Ser: 1 mg/dL (ref 0.44–1.00)
Glucose, Bld: 91 mg/dL (ref 70–99)
HCT: 47 % — ABNORMAL HIGH (ref 36.0–46.0)
Hemoglobin: 16 g/dL — ABNORMAL HIGH (ref 12.0–15.0)
Potassium: 4.1 mmol/L (ref 3.5–5.1)
Sodium: 136 mmol/L (ref 135–145)
TCO2: 17 mmol/L — ABNORMAL LOW (ref 22–32)

## 2019-12-15 LAB — SALICYLATE LEVEL: Salicylate Lvl: <7 mg/dL — ABNORMAL LOW (ref 7.0–30.0)

## 2019-12-15 LAB — ETHANOL: Alcohol, Ethyl (B): <10 mg/dL (ref <10)

## 2019-12-15 LAB — ACETAMINOPHEN LEVEL: Acetaminophen (Tylenol), Serum: <10 ug/mL — ABNORMAL LOW (ref 10–30)

## 2019-12-15 MED ORDER — FLUCONAZOLE 150 MG PO TABS: 150.0000 mg | ORAL_TABLET | Freq: Once | ORAL | 0 refills | Status: AC

## 2019-12-15 MED ORDER — SODIUM CHLORIDE 0.9 % IV SOLN
1.0000 g | Freq: Once | INTRAVENOUS | Status: AC
  Administered 2019-12-15: 1 g via INTRAVENOUS
  Filled 2019-12-15: qty 10

## 2019-12-15 MED ORDER — CEPHALEXIN 500 MG PO CAPS: 500.0000 mg | ORAL_CAPSULE | Freq: Three times a day (TID) | ORAL | 0 refills | Status: DC

## 2019-12-15 MED ORDER — IOHEXOL 350 MG/ML SOLN
75.0000 mL | Freq: Once | INTRAVENOUS | Status: AC | PRN
  Administered 2019-12-15: 75 mL via INTRAVENOUS

## 2019-12-15 MED ORDER — SODIUM CHLORIDE 0.9 % IV SOLN: 500.0000 mg | Freq: Once | INTRAVENOUS | Status: DC

## 2019-12-15 MED ORDER — AZITHROMYCIN 250 MG PO TABS: ORAL_TABLET | ORAL | 0 refills | Status: DC

## 2019-12-15 MED ORDER — SODIUM CHLORIDE 0.9 % IV BOLUS
1000.0000 mL | Freq: Once | INTRAVENOUS | Status: AC
  Administered 2019-12-15: 1000 mL via INTRAVENOUS

## 2019-12-15 NOTE — ED Provider Notes (Addendum)
[facility] DEPT Provider Note   CSN: [id number] Arrival date & time: 12/15/19  1853     History Chief Complaint  Patient presents with  . Altered Mental Status    [name] is a 64 y.o. female history of bipolar, depression, Medtronics pacemaker for sinus pauses, here presenting with dizziness and confusion and falls.  Patient apparently has been confused over the last week or so.  Patient states that since yesterday she has been feeling weak and whenever she walks she states that she leans to the left side.  She states that she has a Medtronic pacemaker there is not MRI compatible.  Patient reported that she was taking too much medicines.  However when I talked to the patient about this, she states that she is prescribed oxycodone and Ativan that she has been taking as prescribed and did not take too much.  Denies any history of stroke in the past.  The history is provided by the patient.       Past Medical History:  Diagnosis Date  . Anxiety    anxiety and mild depressive order systoms  . Bipolar disorder ([place])     (02/20/2013)  . Cervical cancer ([place]) 03/19/1980  . Depression   . Dysrhythmia   . WGYKZLDJ(570.1)    "monthly" (02/20/2013)  . Pacemaker    medtronic  . PONV (postoperative nausea and vomiting)   . Sinus pause 02/14/2013  . Syncope and collapse    echo-April 12,2012-EF 55% Echo normal; recorder with prolonged sinus pauses --> status post  . Tobacco abuse   . Vertigo     Patient Active Problem List   Diagnosis Date Noted  . Cervical spondylosis 09/23/2019  . Bilateral elbow joint pain 08/26/2019  . Memory loss 07/02/2019  . Depression with anxiety 07/02/2019  . B12 deficiency 07/02/2019  . Lumbar radiculopathy 02/10/2019  . History of fusion of lumbar spine 02/10/2019  . Dysesthesia 02/10/2019  . Retroperitoneal fibrosis   . Pelvic adhesions   . Left ovarian cyst 11/17/2018  . Elevated tumor markers 11/17/2018  .  Dyspnea 09/15/2018  . Chest pain of uncertain etiology 77/93/9030  . Laryngopharyngeal reflux (LPR) 05/19/2018  . Mood disorder ([place]) 04/16/2018  . Insomnia 04/16/2018  . Attention deficit hyperactivity disorder (ADHD) 04/16/2018  . Neck pain on right side 09/09/2014  . 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 Family history of arrhythmogenic right ventricular cardiomyopathy 03/30/2013  . 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 02/20/2013  . Sinus pause 02/14/2013  . Near syncope 10/29/2012  . Hx of syncope- s/p Loop recorder 11/06/12 10/29/2012  . Vertigo 10/29/2012  . Tobacco abuse 10/29/2012  . ANXIETY 11/14/2009  . CERUMEN IMPACTION, RIGHT 11/14/2009  . BENIGN POSITIONAL VERTIGO 11/14/2009  . EAR PAIN, RIGHT 11/14/2009  . SCIATICA 05/09/2009    Past Surgical History:  Procedure Laterality Date  . ABDOMINAL HYSTERECTOMY  03/19/1980   "partial"  . BACK SURGERY    . CERVICAL FUSION Left 11/2013   C4-C6  . CERVICAL FUSION     3,4,5,  . INSERT / REPLACE / REMOVE PACEMAKER  02/20/2013   medtronic  . LOOP RECORDER IMPLANT N/A 11/06/2012   Procedure: LINQ LOOP RECORDER IMPLANT;  Surgeon: Sanda Klein, MD;  Location: [facility] CATH LAB;  Service: Cardiovascular;  Laterality: N/A;  . NECK SURGERY  11/17/2013  . PACEMAKER PLACEMENT N/A 02/16/2013  . PERMANENT PACEMAKER INSERTION N/A 02/20/2013   Procedure: PERMANENT PACEMAKER INSERTION;  Surgeon: Sanda Klein, MD;  Location: [facility] CATH LAB;  Service: Cardiovascular;  Laterality: N/A;  . POSTERIOR LUMBAR FUSION  ~ 2009  . ROBOTIC ASSISTED SALPINGO OOPHERECTOMY Bilateral 11/25/2018   Procedure: XI ROBOTIC ASSISTED BILATERAL SALPINGO OOPHORECTOMY and LYSIS OF ADHESIONS.;  Surgeon: Everitt Amber, MD;  Location: [facility] ORS;  Service: Gynecology;  Laterality: Bilateral;  . SPINAL FUSION  03/19/2014  . TONSILLECTOMY    . TRANSTHORACIC ECHOCARDIOGRAM  07/11/2010   The left atrial size is normal.There is no evidence of mitral vavle  prolaspe.Right ventriicular systolic pressure is normal . Injection of contrast documented no interatrial shunt. Essentially normal 2D echo -doppler study      OB History   No obstetric history on file.     Family History  Adopted: Yes  Problem Relation Age of Onset  . Heart attack Brother     Social History   Tobacco Use  . Smoking status: Current Every Day Smoker    Packs/day: 0.00    Years: 40.00    Pack years: 0.00    Types: Cigarettes  . Smokeless tobacco: Never Used  . Tobacco comment: 14 cigarette per day  Vaping Use  . Vaping Use: Never used  Substance Use Topics  . Alcohol use: No  . Drug use: No    Home Medications Prior to Admission medications   Medication Sig Start Date End Date Taking? Authorizing Provider  ALPRAZolam Duanne Moron) 1 MG tablet Take 1 tablet (1 mg total) by mouth 3 (three) times daily as needed for anxiety. Patient taking differently: Take 1 mg by mouth 3 (three) times daily as needed for anxiety or sleep.  12/01/19  Yes Hurst, Helene Kelp T, PA-C  busPIRone (BUSPAR) 10 MG tablet TAKE 1 TABLET BY MOUTH THREE TIMES A DAY Patient taking differently: Take 10 mg by mouth 3 (three) times daily.  11/02/19  Yes Donnal Moat T, PA-C  [facility] B-12 500 MCG tablet Take 500 mcg by mouth daily. 11/27/19  Yes [provider]  cyclobenzaprine (FLEXERIL) 10 MG tablet Take 10 mg by mouth daily as needed for muscle spasms.    Yes [provider]  HYDROcodone-acetaminophen (NORCO) 10-325 MG tablet Take 1 tablet by mouth 3 (three) times daily as needed for moderate pain or severe pain.  11/28/19  Yes [provider]  hydrOXYzine (VISTARIL) 25 MG capsule TAKE 1 CAPSULE (25 MG TOTAL) BY MOUTH 3 (THREE) TIMES DAILY AS NEEDED, may take 50-100 mg qhs prn sleep. Patient taking differently: Take 25 mg by mouth daily.  10/30/19  Yes Hurst, Helene Kelp T, PA-C  mirtazapine (REMERON) 15 MG tablet Take 22.5 mg by mouth at bedtime.    Yes [provider]   Oxycodone HCl 10 MG TABS Take 10 mg by mouth 3 (three) times daily as needed (for pain).  12/08/19  Yes [provider]  risperiDONE (RISPERDAL) 3 MG tablet Take 1 tablet (3 mg total) by mouth at bedtime. 12/01/19  Yes Donnal Moat T, PA-C  thiamine (VITAMIN B-1) 100 MG tablet Take 100 mg by mouth daily.   Yes [provider]  tiZANidine (ZANAFLEX) 4 MG tablet Take 4 mg by mouth 3 (three) times daily as needed for muscle spasms.  11/12/19  Yes [provider]  Melatonin 3-10 MG TABS Take 3-10 mg by mouth at bedtime as needed. Patient not taking: Reported on 12/15/2019 10/30/19   Donnal Moat T, PA-C  ondansetron [facility] ODT) 8 MG disintegrating tablet 8mg  ODT q4 hours prn nausea Patient not taking: Reported on 12/15/2019 11/07/19  Veryl Speak, MD  sertraline (ZOLOFT) 100 MG tablet Take 2 tablets (200 mg total) by mouth at bedtime. Patient not taking: Reported on 12/15/2019 10/30/19   Donnal Moat T, PA-C  zaleplon (SONATA) 10 MG capsule 1 po qhs prn sleep, and may repeat 1 for midnocturnal awakening as long as there are 3 hours left to sleep Patient not taking: Reported on 12/15/2019 10/30/19   Donnal Moat T, PA-C    Allergies    Penicillins  Review of Systems   Review of Systems  Neurological: Positive for dizziness and weakness.  All other systems reviewed and are negative.   Physical Exam Updated Vital Signs BP 132/71   Pulse (!) 101   Temp 98.4 F (36.9 C) (Oral)   Resp (!) 26   Ht 5\' 7"  (1.702 m)   Wt 53.5 kg   SpO2 97%   BMI 18.48 kg/m   Physical Exam Vitals and nursing note reviewed.  HENT:     Head: Normocephalic.     Nose: Nose normal.     Mouth/Throat:     Mouth: Mucous membranes are moist.  Eyes:     Pupils: Pupils are equal, round, and reactive to light.  Neck:     Comments: No bruit  Cardiovascular:     Rate and Rhythm: Regular rhythm. Tachycardia present.     Pulses: Normal pulses.  Pulmonary:     Effort: Pulmonary effort is  normal.     Breath sounds: Normal breath sounds.  Abdominal:     General: Abdomen is flat.     Palpations: Abdomen is soft.  Musculoskeletal:        General: Normal range of motion.     Cervical back: Normal range of motion.  Skin:    General: Skin is warm.     Capillary Refill: Capillary refill takes less than 2 seconds.  Neurological:     Mental Status: She is alert.     Comments: Strength 4/5 L arm and leg. Nl finger to nose bilaterally. Nl sensation bilateral arms and legs   Psychiatric:        Mood and Affect: Mood normal.     ED Results / Procedures / Treatments   Labs (all labs ordered are listed, but only abnormal results are displayed) Labs Reviewed  CBC WITH DIFFERENTIAL/PLATELET - Abnormal; Notable for the following components:      Result Value   WBC 17.9 (*)    RBC 5.29 (*)    Hemoglobin 15.2 (*)    HCT 46.4 (*)    Neutro Abs 15.5 (*)    All other components within normal limits  COMPREHENSIVE METABOLIC PANEL - Abnormal; Notable for the following components:   CO2 18 (*)    Creatinine, Ser 1.12 (*)    AST 152 (*)    Total Bilirubin 1.9 (*)    GFR calc non Af Amer 53 (*)    All other components within normal limits  SALICYLATE LEVEL - Abnormal; Notable for the following components:   Salicylate Lvl <7.0 (*)    All other components within normal limits  ACETAMINOPHEN LEVEL - Abnormal; Notable for the following components:   Acetaminophen (Tylenol), Serum <10 (*)    All other components within normal limits  I-STAT CHEM 8, ED - Abnormal; Notable for the following components:   Calcium, Ion 1.14 (*)    TCO2 17 (*)    Hemoglobin 16.0 (*)    HCT 47.0 (*)    All other components within  normal limits  ETHANOL  RAPID URINE DRUG SCREEN, HOSP PERFORMED  URINALYSIS, ROUTINE W REFLEX MICROSCOPIC    EKG EKG Interpretation  Date/Time:  Tuesday December 15 2019 19:58:58 EDT Ventricular Rate:  118 PR Interval:    QRS Duration: 80 QT Interval:  280 QTC  Calculation: 393 R Axis:   11 Text Interpretation: Sinus tachycardia Probable left atrial enlargement Abnormal R-wave progression, early transition Borderline T abnormalities, inferior leads Since last tracing rate faster Confirmed by Wandra Arthurs [phone]) on 12/15/2019 8:01:17 PM   Radiology CT Angio Head W or Wo Contrast  Result Date: 12/15/2019 CLINICAL DATA:  Vertigo and left-sided weakness EXAM: CT ANGIOGRAPHY HEAD AND NECK TECHNIQUE: Multidetector CT imaging of the head and neck was performed using the standard protocol during bolus administration of intravenous contrast. Multiplanar CT image reconstructions and MIPs were obtained to evaluate the vascular anatomy. Carotid stenosis measurements (when applicable) are obtained utilizing NASCET criteria, using the distal internal carotid diameter as the denominator. CONTRAST:  24mL OMNIPAQUE IOHEXOL 350 MG/ML SOLN COMPARISON:  None. FINDINGS: CT HEAD FINDINGS Brain: There is no mass, hemorrhage or extra-axial collection. The size and configuration of the ventricles and extra-axial CSF spaces are normal. There is no acute or chronic infarction. The brain parenchyma is normal. Skull: The visualized skull base, calvarium and extracranial soft tissues are normal. Sinuses/Orbits: No fluid levels or advanced mucosal thickening of the visualized paranasal sinuses. No mastoid or middle ear effusion. The orbits are normal. CTA NECK FINDINGS SKELETON: There is no bony spinal canal stenosis. No lytic or blastic lesion. OTHER NECK: Normal pharynx, larynx and major salivary glands. No cervical lymphadenopathy. Unremarkable thyroid gland. UPPER CHEST: Hazy biapical opacities. AORTIC ARCH: There is no calcific atherosclerosis of the aortic arch. There is no aneurysm, dissection or hemodynamically significant stenosis of the visualized portion of the aorta. Conventional 3 vessel aortic branching pattern. The visualized proximal subclavian arteries are widely patent. RIGHT  CAROTID SYSTEM: Normal without aneurysm, dissection or stenosis. LEFT CAROTID SYSTEM: Normal without aneurysm, dissection or stenosis. VERTEBRAL ARTERIES: Left dominant configuration. Both origins are clearly patent. There is no dissection, occlusion or flow-limiting stenosis to the skull base (V1-V3 segments). CTA HEAD FINDINGS POSTERIOR CIRCULATION: --Vertebral arteries: Normal V4 segments. --Inferior cerebellar arteries: Normal. --Basilar artery: Normal. --Superior cerebellar arteries: Normal. --Posterior cerebral arteries (PCA): Normal. Both are predominantly supplied by the posterior communicating arteries (p-comm). ANTERIOR CIRCULATION: --Intracranial internal carotid arteries: Normal. --Anterior cerebral arteries (ACA): Normal. Both A1 segments are present. Patent anterior communicating artery (a-comm). --Middle cerebral arteries (MCA): Normal. VENOUS SINUSES: As permitted by contrast timing, patent. ANATOMIC VARIANTS: Fetal origins of both posterior cerebral arteries. Review of the MIP images confirms the above findings. IMPRESSION: 1. Normal CTA of the head and neck. 2. Hazy biapical opacities, which may indicate pulmonary edema or infection. Electronically Signed   By: Ulyses Jarred M.D.   On: 12/15/2019 21:36   DG Chest 1 View  Result Date: 12/15/2019 CLINICAL DATA:  Multiple falls, altered mental status EXAM: CHEST  1 VIEW COMPARISON:  Radiograph 09/17/2019, CT 04/20/2019 FINDINGS: Some streaky opacities in the lung bases with low volumes favor atelectasis. No consolidation, features of edema, pneumothorax, or effusion. Pacer pack overlies the left chest wall with leads at the right atrium and cardiac apex in similar position to prior. The aorta is calcified. The remaining cardiomediastinal contours are unremarkable. No visible displaced rib fracture or other acute traumatic osseous injury of the chest wall. Mild degenerative changes in the shoulders and  spine. Soft tissues are unremarkable.  IMPRESSION: 1. Streaky opacities in the lung bases favor atelectasis. No acute cardiopulmonary abnormality. 2. No visible displaced rib fracture or other acute traumatic osseous injury of the chest wall. Electronically Signed   By: Lovena Le M.D.   On: 12/15/2019 20:41   CT Angio Neck W and/or Wo Contrast  Result Date: 12/15/2019 CLINICAL DATA:  Vertigo and left-sided weakness EXAM: CT ANGIOGRAPHY HEAD AND NECK TECHNIQUE: Multidetector CT imaging of the head and neck was performed using the standard protocol during bolus administration of intravenous contrast. Multiplanar CT image reconstructions and MIPs were obtained to evaluate the vascular anatomy. Carotid stenosis measurements (when applicable) are obtained utilizing NASCET criteria, using the distal internal carotid diameter as the denominator. CONTRAST:  58mL OMNIPAQUE IOHEXOL 350 MG/ML SOLN COMPARISON:  None. FINDINGS: CT HEAD FINDINGS Brain: There is no mass, hemorrhage or extra-axial collection. The size and configuration of the ventricles and extra-axial CSF spaces are normal. There is no acute or chronic infarction. The brain parenchyma is normal. Skull: The visualized skull base, calvarium and extracranial soft tissues are normal. Sinuses/Orbits: No fluid levels or advanced mucosal thickening of the visualized paranasal sinuses. No mastoid or middle ear effusion. The orbits are normal. CTA NECK FINDINGS SKELETON: There is no bony spinal canal stenosis. No lytic or blastic lesion. OTHER NECK: Normal pharynx, larynx and major salivary glands. No cervical lymphadenopathy. Unremarkable thyroid gland. UPPER CHEST: Hazy biapical opacities. AORTIC ARCH: There is no calcific atherosclerosis of the aortic arch. There is no aneurysm, dissection or hemodynamically significant stenosis of the visualized portion of the aorta. Conventional 3 vessel aortic branching pattern. The visualized proximal subclavian arteries are widely patent. RIGHT CAROTID SYSTEM:  Normal without aneurysm, dissection or stenosis. LEFT CAROTID SYSTEM: Normal without aneurysm, dissection or stenosis. VERTEBRAL ARTERIES: Left dominant configuration. Both origins are clearly patent. There is no dissection, occlusion or flow-limiting stenosis to the skull base (V1-V3 segments). CTA HEAD FINDINGS POSTERIOR CIRCULATION: --Vertebral arteries: Normal V4 segments. --Inferior cerebellar arteries: Normal. --Basilar artery: Normal. --Superior cerebellar arteries: Normal. --Posterior cerebral arteries (PCA): Normal. Both are predominantly supplied by the posterior communicating arteries (p-comm). ANTERIOR CIRCULATION: --Intracranial internal carotid arteries: Normal. --Anterior cerebral arteries (ACA): Normal. Both A1 segments are present. Patent anterior communicating artery (a-comm). --Middle cerebral arteries (MCA): Normal. VENOUS SINUSES: As permitted by contrast timing, patent. ANATOMIC VARIANTS: Fetal origins of both posterior cerebral arteries. Review of the MIP images confirms the above findings. IMPRESSION: 1. Normal CTA of the head and neck. 2. Hazy biapical opacities, which may indicate pulmonary edema or infection. Electronically Signed   By: Ulyses Jarred M.D.   On: 12/15/2019 21:36    Procedures Procedures (including critical care time)  Medications Ordered in ED Medications  sodium chloride 0.9 % bolus 1,000 mL (1,000 mLs Intravenous New Bag/Given 12/15/19 2035)  iohexol (OMNIPAQUE) 350 MG/ML injection 75 mL (75 mLs Intravenous Contrast Given 12/15/19 2104)  cefTRIAXone (ROCEPHIN) 1 g in sodium chloride 0.9 % 100 mL IVPB (1 g Intravenous New Bag/Given 12/15/19 2209)    ED Course  I have reviewed the triage vital signs and the nursing notes.  Pertinent labs & imaging results that were available during my care of the patient were reviewed by me and considered in my medical decision making (see chart for details).    MDM Rules/Calculators/A&P                         Tabitha Flirt  Kim is a 64 y.o. female who presented with left arm and leg weakness and falling and confusion.  It is hard to sort out the timeframe.  Seems like the dizziness started for the last week or so and weakness started since yesterday.  She is not within the TPA window.  She also cannot get MRI due to her pacemaker.  Will get CTA head and neck.  Will also get some lab work as well as tox levels.   10:39 PM White blood cell count is 17.  Chest x-ray showed possible pneumonia.  CTA did not show any LVO.  Because of the leukocytosis I was initially planning on admitting her for IV antibiotics for observation.  However patient is very adamant she wants to go home.  Her weakness improved with IV fluids.  Given that she has no obvious LVO and her weakness improved, I think she can go home.  I talked to her daughter who states that she can stay with her tonight to observe her.  Her serum tox levels were negative and she is adamant that she did not overdose on her medicines.  She states that she changed doctors and she just needs to sort out her medicines.  Of note, her tachycardia 120 resolved after IV fluids and now her heart rate is 101.   Final Clinical Impression(s) / ED Diagnoses Final diagnoses:  None    Rx / DC Orders ED Discharge Orders    None       Drenda Freeze, MD 12/15/19 2240    Drenda Freeze, MD 12/15/19 2241

## 2019-12-15 NOTE — Discharge Instructions (Signed)
You have pneumonia on chest x-ray.  Please take Keflex and azithromycin as prescribed.  Your heart rate is elevated so please interrogate your Medtronics and send it to your heart doctor.  You may take Diflucan if you have a yeast infection.  See your doctor for follow-up in a week.  Return to ER if you have worse confusion, cough, fever, trouble breathing.

## 2019-12-15 NOTE — ED Notes (Signed)
Pt escorted to vehicle and departed with daughter. Pt more alert and more ambulatory. Instructed to retuen to ER with worsening symptoms or no improvement.

## 2019-12-15 NOTE — ED Notes (Signed)
Pt ambulated in room by self. Slightly unsteady and leaned to the left.

## 2019-12-15 NOTE — ED Triage Notes (Signed)
Per EMS, pt was found by family sitting beside her bed on the floor. No hx of LOC, she recalled falling. She has fallen 3 times today. Family states she is more confused than normal.  She has hx of taking too many pain medications. Husband recently committed suicide. Pt has been confused for the past week.   BP 109/73 HR 124 Temp 98.6 O2 95% on RA CBG 90

## 2019-12-17 ENCOUNTER — Encounter (HOSPITAL_COMMUNITY): Payer: Self-pay

## 2019-12-17 ENCOUNTER — Telehealth: Payer: Self-pay | Admitting: Cardiovascular Disease

## 2019-12-17 ENCOUNTER — Emergency Department (HOSPITAL_COMMUNITY): Payer: Medicare HMO

## 2019-12-17 ENCOUNTER — Telehealth (INDEPENDENT_AMBULATORY_CARE_PROVIDER_SITE_OTHER): Payer: Medicare HMO | Admitting: Physician Assistant

## 2019-12-17 ENCOUNTER — Telehealth: Payer: Self-pay | Admitting: Physician Assistant

## 2019-12-17 ENCOUNTER — Inpatient Hospital Stay (HOSPITAL_COMMUNITY)
Admission: EM | Admit: 2019-12-17 | Discharge: 2019-12-21 | DRG: 194 | Disposition: A | Discharge status: Home / Self Care | Payer: Medicare HMO | Attending: Internal Medicine | Admitting: Internal Medicine

## 2019-12-17 ENCOUNTER — Encounter: Payer: Self-pay | Admitting: Physician Assistant

## 2019-12-17 ENCOUNTER — Other Ambulatory Visit: Payer: Self-pay

## 2019-12-17 DIAGNOSIS — Z720 Tobacco use: Secondary | ICD-10-CM | POA: Diagnosis not present

## 2019-12-17 DIAGNOSIS — R000 Tachycardia, unspecified: Secondary | ICD-10-CM | POA: Diagnosis not present

## 2019-12-17 DIAGNOSIS — R0689 Other abnormalities of breathing: Secondary | ICD-10-CM | POA: Diagnosis not present

## 2019-12-17 DIAGNOSIS — R7989 Other specified abnormal findings of blood chemistry: Secondary | ICD-10-CM

## 2019-12-17 DIAGNOSIS — F17200 Nicotine dependence, unspecified, uncomplicated: Secondary | ICD-10-CM | POA: Diagnosis not present

## 2019-12-17 DIAGNOSIS — R55 Syncope and collapse: Secondary | ICD-10-CM | POA: Diagnosis present

## 2019-12-17 DIAGNOSIS — F39 Unspecified mood [affective] disorder: Secondary | ICD-10-CM

## 2019-12-17 DIAGNOSIS — Z8541 Personal history of malignant neoplasm of cervix uteri: Secondary | ICD-10-CM | POA: Diagnosis not present

## 2019-12-17 DIAGNOSIS — F17210 Nicotine dependence, cigarettes, uncomplicated: Secondary | ICD-10-CM | POA: Diagnosis present

## 2019-12-17 DIAGNOSIS — Z880 Allergy status to penicillin: Secondary | ICD-10-CM

## 2019-12-17 DIAGNOSIS — R079 Chest pain, unspecified: Secondary | ICD-10-CM | POA: Diagnosis not present

## 2019-12-17 DIAGNOSIS — Z950 Presence of cardiac pacemaker: Secondary | ICD-10-CM

## 2019-12-17 DIAGNOSIS — G4700 Insomnia, unspecified: Secondary | ICD-10-CM | POA: Diagnosis present

## 2019-12-17 DIAGNOSIS — Z79899 Other long term (current) drug therapy: Secondary | ICD-10-CM | POA: Diagnosis not present

## 2019-12-17 DIAGNOSIS — R69 Illness, unspecified: Secondary | ICD-10-CM | POA: Diagnosis not present

## 2019-12-17 DIAGNOSIS — G4710 Hypersomnia, unspecified: Secondary | ICD-10-CM

## 2019-12-17 DIAGNOSIS — F419 Anxiety disorder, unspecified: Secondary | ICD-10-CM | POA: Diagnosis present

## 2019-12-17 DIAGNOSIS — Y92012 Bathroom of single-family (private) house as the place of occurrence of the external cause: Secondary | ICD-10-CM | POA: Diagnosis not present

## 2019-12-17 DIAGNOSIS — J90 Pleural effusion, not elsewhere classified: Secondary | ICD-10-CM | POA: Diagnosis not present

## 2019-12-17 DIAGNOSIS — J189 Pneumonia, unspecified organism: Principal | ICD-10-CM | POA: Diagnosis present

## 2019-12-17 DIAGNOSIS — D500 Iron deficiency anemia secondary to blood loss (chronic): Secondary | ICD-10-CM | POA: Diagnosis not present

## 2019-12-17 DIAGNOSIS — Z20822 Contact with and (suspected) exposure to covid-19: Secondary | ICD-10-CM | POA: Diagnosis present

## 2019-12-17 DIAGNOSIS — W19XXXA Unspecified fall, initial encounter: Secondary | ICD-10-CM | POA: Diagnosis present

## 2019-12-17 DIAGNOSIS — R296 Repeated falls: Secondary | ICD-10-CM | POA: Diagnosis present

## 2019-12-17 DIAGNOSIS — E876 Hypokalemia: Secondary | ICD-10-CM

## 2019-12-17 DIAGNOSIS — Z8249 Family history of ischemic heart disease and other diseases of the circulatory system: Secondary | ICD-10-CM

## 2019-12-17 DIAGNOSIS — W19XXXD Unspecified fall, subsequent encounter: Secondary | ICD-10-CM | POA: Diagnosis not present

## 2019-12-17 DIAGNOSIS — G8929 Other chronic pain: Secondary | ICD-10-CM | POA: Diagnosis present

## 2019-12-17 DIAGNOSIS — R945 Abnormal results of liver function studies: Secondary | ICD-10-CM | POA: Diagnosis not present

## 2019-12-17 DIAGNOSIS — G9340 Encephalopathy, unspecified: Secondary | ICD-10-CM

## 2019-12-17 DIAGNOSIS — F411 Generalized anxiety disorder: Secondary | ICD-10-CM | POA: Diagnosis not present

## 2019-12-17 DIAGNOSIS — E538 Deficiency of other specified B group vitamins: Secondary | ICD-10-CM | POA: Diagnosis not present

## 2019-12-17 DIAGNOSIS — E872 Acidosis: Secondary | ICD-10-CM | POA: Diagnosis not present

## 2019-12-17 DIAGNOSIS — R0789 Other chest pain: Secondary | ICD-10-CM | POA: Diagnosis not present

## 2019-12-17 DIAGNOSIS — J9811 Atelectasis: Secondary | ICD-10-CM | POA: Diagnosis not present

## 2019-12-17 DIAGNOSIS — D519 Vitamin B12 deficiency anemia, unspecified: Secondary | ICD-10-CM | POA: Diagnosis not present

## 2019-12-17 DIAGNOSIS — F319 Bipolar disorder, unspecified: Secondary | ICD-10-CM | POA: Diagnosis present

## 2019-12-17 DIAGNOSIS — R413 Other amnesia: Secondary | ICD-10-CM

## 2019-12-17 DIAGNOSIS — G9341 Metabolic encephalopathy: Secondary | ICD-10-CM | POA: Diagnosis not present

## 2019-12-17 DIAGNOSIS — R410 Disorientation, unspecified: Secondary | ICD-10-CM | POA: Diagnosis present

## 2019-12-17 DIAGNOSIS — Z981 Arthrodesis status: Secondary | ICD-10-CM

## 2019-12-17 DIAGNOSIS — W1830XA Fall on same level, unspecified, initial encounter: Secondary | ICD-10-CM | POA: Diagnosis present

## 2019-12-17 LAB — COMPREHENSIVE METABOLIC PANEL
ALT: 53 U/L — ABNORMAL HIGH (ref 0–44)
AST: 92 U/L — ABNORMAL HIGH (ref 15–41)
Albumin: 4 g/dL (ref 3.5–5.0)
Alkaline Phosphatase: 55 U/L (ref 38–126)
Anion gap: 12 (ref 5–15)
BUN: 8 mg/dL (ref 8–23)
CO2: 22 mmol/L (ref 22–32)
Calcium: 9.8 mg/dL (ref 8.9–10.3)
Chloride: 107 mmol/L (ref 98–111)
Creatinine, Ser: 0.91 mg/dL (ref 0.44–1.00)
GFR calc Af Amer: >60 mL/min (ref >60)
GFR calc non Af Amer: >60 mL/min (ref >60)
Glucose, Bld: 118 mg/dL — ABNORMAL HIGH (ref 70–99)
Potassium: 3.3 mmol/L — ABNORMAL LOW (ref 3.5–5.1)
Sodium: 141 mmol/L (ref 135–145)
Total Bilirubin: 0.9 mg/dL (ref 0.3–1.2)
Total Protein: 7.1 g/dL (ref 6.5–8.1)

## 2019-12-17 LAB — CBC WITH DIFFERENTIAL/PLATELET
Abs Immature Granulocytes: 0.03 10*3/uL (ref 0.00–0.07)
Basophils Absolute: 0.1 10*3/uL (ref 0.0–0.1)
Basophils Relative: 1 %
Eosinophils Absolute: 0.2 10*3/uL (ref 0.0–0.5)
Eosinophils Relative: 2 %
HCT: 46.4 % — ABNORMAL HIGH (ref 36.0–46.0)
Hemoglobin: 14.9 g/dL (ref 12.0–15.0)
Immature Granulocytes: 0 %
Lymphocytes Relative: 26 %
Lymphs Abs: 2.7 10*3/uL (ref 0.7–4.0)
MCH: 28.9 pg (ref 26.0–34.0)
MCHC: 32.1 g/dL (ref 30.0–36.0)
MCV: 89.9 fL (ref 80.0–100.0)
Monocytes Absolute: 0.8 10*3/uL (ref 0.1–1.0)
Monocytes Relative: 7 %
Neutro Abs: 6.6 10*3/uL (ref 1.7–7.7)
Neutrophils Relative %: 64 %
Platelets: 362 10*3/uL (ref 150–400)
RBC: 5.16 MIL/uL — ABNORMAL HIGH (ref 3.87–5.11)
RDW: 14.1 % (ref 11.5–15.5)
WBC: 10.4 10*3/uL (ref 4.0–10.5)
nRBC: 0 % (ref 0.0–0.2)

## 2019-12-17 LAB — LACTIC ACID, PLASMA: Lactic Acid, Venous: 1.6 mmol/L (ref 0.5–1.9)

## 2019-12-17 LAB — RESPIRATORY PANEL BY RT PCR (FLU A&B, COVID)
Influenza A by PCR: NEGATIVE
Influenza B by PCR: NEGATIVE
SARS Coronavirus 2 by RT PCR: NEGATIVE

## 2019-12-17 NOTE — Telephone Encounter (Signed)
      I went in pt's chart to see who had called pt back earlier and told her to send a transmission

## 2019-12-17 NOTE — Telephone Encounter (Signed)
Reviewed

## 2019-12-17 NOTE — Progress Notes (Signed)
[facility]  Patient ID: [name],  MRN: [id number]  PCP: Pcp, No  Date of Evaluation: 12/17/2019  time spent:40 minutes  Chief Complaint:  Chief Complaint    Anxiety; Depression; Insomnia; Altered Mental Status; Medication Problem     Virtual Visit via Telehealth  I connected with patient by a video enabled telemedicine application with their informed consent, and verified patient privacy and that I am speaking with the correct person using two identifiers.  I am private, in my office and the patient is at home.  I discussed the limitations, risks, security and privacy concerns of performing an evaluation and management service by video and the availability of in person appointments. I also discussed with the patient that there may be a patient responsible charge related to this service. The patient expressed understanding and agreed to proceed.   I discussed the assessment and treatment plan with the patient. The patient was provided an opportunity to ask questions and all were answered. The patient agreed with the plan and demonstrated an understanding of the instructions.   The patient was advised to call back or seek an in-person evaluation if the symptoms worsen or if the condition fails to improve as anticipated.  I provided 40 minutes of non-face-to-face time during this encounter.  HISTORY/CURRENT STATUS: HPI For routine f/u.  Not doing well.  Daughter [name] is in the room with her.  Since her last visit, [name] has had several problems.  She states she had to go to the emergency room and her heart doctor.  States the [place] gave her horrible problems, caused her to be dizzy and out of her head.  She reports these problems as if the ER visit in cardiology visit were due to the [facility], but when looking at the chart, ER visit is for something entirely different.  She did have nausea and reported abdominal distention but normal labs and CT scan so she was  allowed to go home.  But more importantly, states she has fallen several times and has bruises "on my body."  Her daughter states that she went over to see her mom a few days ago and found her on the floor.  They called EMS because patient was incoherent, thought the president was [name] and was very sedated.  She was taken to the ER, where she herself reported that she was taking too much medicine.  [name] told me that the doctor there said she was taking too much medication and the only medicine she should be on is sertraline, Xanax, and hydrocodone.  That is not noted in her chart.  And according to the ER notes on 12/15/2019, her white count was elevated at 17.9 and the chest x-ray showed possible pneumonia.  It was recommended that she be admitted for IV antibiotics but she was adamant to go home, was sent home on oral antibiotics.  As I was reviewing the patient's medications, there is confusion with her psych medications.  According to my records at the last visit she is supposed to be on BuSpar but she and her daughter state she is not.  Of course understandably the [employer] has been stopped.  She is not taking hydroxyzine when I thought she was.  Also thought she was taking the mirtazapine but she is not.  She is taking Zoloft and Risperdal as directed, as well as the Xanax.  States the anxiety is pretty bad.  The Xanax does help a lot.  Her daughter is  not sure if she is overtaking this, or any of her medications for that matter, because she forgets that she is already taken something.  Patient states that is a possibility.  She also reports not sleeping well.  Has trouble falling asleep and has the perception that she wakes up often and never feels rested when she gets up.  Energy and motivation are low, partly because of her physical problems of chronic neck and back pain, bilateral leg pain recently, and cardiac issues (further details on chart.)  States she feels down a lot.  Does not cry  easily.  No suicidal or homicidal thoughts.  Patient denies increased energy with decreased need for sleep, no increased talkativeness, no racing thoughts, no impulsivity or risky behaviors, no increased spending, no increased libido, no grandiosity, no increased irritability or anger, and no hallucinations.  No paranoia.  Denies dizziness, syncope, seizures, numbness, tingling, tremor, tics, unsteady gait, slurred speech, and specifically denies confusion and memory loss.  Denies muscle or joint pain, stiffness, or dystonia.  Individual Medical History/ Review of Systems: Changes? :Yes   See HPI and records on chart.  Past medications for mental health diagnoses include: Trazodone, Belsomra, Risperdal, Zoloft, hydroxyzine, mirtazapine, Xanax, Valium, Wellbutrin, Abilify, gabapentin, Strattera, Vyvanse, Ambien, Lunesta, Dayvigo didn't help, Latuda  Allergies: Penicillins  Current Medications:  Current Outpatient Medications:  .  ALPRAZolam (XANAX) 1 MG tablet, Take 1 tablet (1 mg total) by mouth 3 (three) times daily as needed for anxiety. (Patient taking differently: Take 1 mg by mouth 3 (three) times daily as needed for anxiety or sleep. ), Disp: 90 tablet, Rfl: 1 .  cephALEXin (KEFLEX) 500 MG capsule, Take 1 capsule (500 mg total) by mouth 3 (three) times daily., Disp: 21 capsule, Rfl: 0 .  [employer] B-12 500 MCG tablet, Take 500 mcg by mouth daily., Disp: , Rfl:  .  cyclobenzaprine (FLEXERIL) 10 MG tablet, Take 10 mg by mouth daily as needed for muscle spasms. , Disp: , Rfl:  .  HYDROcodone-acetaminophen (NORCO) 10-325 MG tablet, Take 1 tablet by mouth 3 (three) times daily as needed for moderate pain or severe pain. , Disp: , Rfl:  .  Melatonin 3-10 MG TABS, Take 3-10 mg by mouth at bedtime as needed., Disp: 30 tablet, Rfl: 0 .  ondansetron (ZOFRAN ODT) 8 MG disintegrating tablet, 8mg  ODT q4 hours prn nausea, Disp: 8 tablet, Rfl: 0 .  risperiDONE (RISPERDAL) 3 MG tablet, Take 1 tablet (3 mg  total) by mouth at bedtime., Disp: 90 tablet, Rfl: 0 .  sertraline (ZOLOFT) 100 MG tablet, Take 2 tablets (200 mg total) by mouth at bedtime., Disp: 180 tablet, Rfl: 0 .  tiZANidine (ZANAFLEX) 4 MG tablet, Take 4 mg by mouth 3 (three) times daily as needed for muscle spasms. , Disp: , Rfl:  .  azithromycin (ZITHROMAX Z-PAK) 250 MG tablet, 2 po day one, then 1 daily x 4 days (Patient not taking: Reported on 12/17/2019), Disp: 6 tablet, Rfl: 0 .  busPIRone (BUSPAR) 10 MG tablet, TAKE 1 TABLET BY MOUTH THREE TIMES A DAY (Patient not taking: Reported on 12/17/2019), Disp: 270 tablet, Rfl: 0 .  hydrOXYzine (VISTARIL) 25 MG capsule, TAKE 1 CAPSULE (25 MG TOTAL) BY MOUTH 3 (THREE) TIMES DAILY AS NEEDED, may take 50-100 mg qhs prn sleep. (Patient not taking: Reported on 12/17/2019), Disp: 270 capsule, Rfl: 0 .  mirtazapine (REMERON) 15 MG tablet, Take 22.5 mg by mouth at bedtime.  (Patient not taking: Reported on 12/17/2019), Disp: ,  Rfl:  .  Oxycodone HCl 10 MG TABS, Take 10 mg by mouth 3 (three) times daily as needed (for pain).  (Patient not taking: Reported on 12/17/2019), Disp: , Rfl:  .  thiamine (VITAMIN B-1) 100 MG tablet, Take 100 mg by mouth daily. (Patient not taking: Reported on 12/17/2019), Disp: , Rfl:  .  zaleplon (SONATA) 10 MG capsule, 1 po qhs prn sleep, and may repeat 1 for midnocturnal awakening as long as there are 3 hours left to sleep (Patient not taking: Reported on 12/15/2019), Disp: 60 capsule, Rfl: 0 Medication Side Effects: none  Family Medical/ Social History: Changes? No  MENTAL HEALTH EXAM:  There were no vitals taken for this visit.There is no height or weight on file to calculate BMI.  General Appearance: Casual, Neat and Well Groomed  Eye Contact:  Good  Speech:  Clear and Coherent and Normal Rate  Volume:  Normal  Mood:  Depressed  Affect:  Flat  Thought Process:  Goal Directed and Descriptions of Associations: Intact  Orientation:  Full (Time, Place, and Person)  Thought  Content: Logical   Suicidal Thoughts:  No  Homicidal Thoughts:  No  Memory:  WNL  Judgement:  Good  Insight:  Fair  Psychomotor Activity:  Normal  Concentration:  Concentration: Good and Attention Span: Good  Recall:  Good  Fund of Knowledge: Good  Language: Good  Assets:  Desire for Improvement  ADL's:  Intact  Cognition: WNL  Prognosis:  Fair   See ER notes 12/15/2019, labs, xrays   DIAGNOSES:    ICD-10-CM   1. Episodic mood disorder ([place])  F39   2. 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 Smoker  F17.200   5. Polypharmacy  Z79.899   6. Memory change  R41.3     Receiving Psychotherapy: No    RECOMMENDATIONS:  PDMP was reviewed. I provided 40 minutes of nonface-to-face time during this encounter, including review of ER note from 12/15/2019, labs, xray and other notes.  I had a long discussion with the patient and her daughter.  She is taking many drugs that are sedating including the Risperdal, Xanax, tizanidine, hydrocodone or oxycodone, I am unsure which, hydroxyzine if she is taking it, and cyclobenzaprine if she is taking that.  There seems to be quite a bit of confusion with her medications.  Her daughter lives 5 minutes away and is eager to help her mom with her medications.  I recommended she get a daily pillbox and put the meds in their appropriately, so there will be no confusion as to whether she is taken a drug or not. As far as the psychiatric medications are concerned, I plan to wean even more on the Xanax.  We had already made some headway earlier in the summer but it seemed she was doing fine at the current dose so I did not wean further at the last visit.  I will get her as low as possible or hopefully completely off the Xanax within the next 3 months or so.  I reminded her once again there are other ways to treat anxiety, that she does not have to use a benzodiazepine which is an addictive controlled substance, to get  relief. As for the sleep, she asks again about medication to help.  I replied there will be no further sleep medications, she can take melatonin over-the-counter, sleep hygiene was discussed including caffeine being cut out at lunch or before,  no naps during the day, no screen time within 2 hours of the time she wants to go to sleep. Since there is confusion concerning the BuSpar and the fact that her daughter has not found a bottle of that, will leave that off for now. Discontinue Sonata. Continue Zoloft 100 mg, 2 p.o. nightly. Continue Risperdal 3 mg nightly.  I reminded her that this is for her mood disorder as well as anxiety. Decrease Xanax 1 mg to 1 p.o. twice daily and one half p.o. during midday as needed anxiety.  She and her daughter understand that this is no more than 2.5 pills/day.  I will ask our CMA to call her pharmacist to cancel the refill that was given on the 12/01/2019 prescription and when the patient runs out, I will only give her enough medication to last for 2-week periods at a time until she is completely off, or I feel like she is at a safe dose given the other sedating medications she needs to be on. She has discontinued BuSpar, hydroxyzine, mirtazapine, thiamine on her own. Recommend counseling. Return in 4 weeks.  Donnal Moat, PA-C

## 2019-12-17 NOTE — ED Triage Notes (Signed)
Pt to rm 30 by [facility]. Pt's family informed EMS that pt has been increasingly incoherent and lethargic x 2 weeks. Pt was seen at [facility] two days ago and diagnosed wth PNA. Pt has not taken prescribed antibiotics due to pharmacy being backed up per family. Pt fell today in bathroom, landed on left arm/chest. Pt c/o being cold, nausea, and pain in left arm/chest/back area. Pt alert and oriented on arrival, slow to answer questions.

## 2019-12-17 NOTE — Telephone Encounter (Signed)
ED note reviewed. I spoke with patient. She has not had any chest pain since discharged from ED. 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  Had recent remote check on 12/02/19.  Patient will send another remote transmission now to see if any new events. Patient's speech is slow and difficult to understand.  I asked her if someone was with her and she reports she has someone who is staying with her. This person just went home for a short time.  When I asked about her speech she said she was tired.  Speech then became clearer.  Patient denies weakness, facial droop or any other issues.   Patient has phone visit scheduled for 12/21/19 with Janan Ridge, PA and she is aware of this appointment.

## 2019-12-17 NOTE — Telephone Encounter (Signed)
[name], please call the pharmacy and have them cancel the refill on her Xanax prescription that was sent in on 12/01/2019.  Thanks.

## 2019-12-17 NOTE — ED Provider Notes (Addendum)
[facility] EMERGENCY DEPARTMENT Provider Note   CSN: [id number] Arrival date & time: 12/17/19  2050     History Chief Complaint  Patient presents with  . Fall    [name] is a 64 y.o. female with past medical history of anxiety, chronic pain, bipolar disorder, cervical cancer, presenting to the ED via EMS for altered mental status and fall.  Patient states she was tidying up in her bathroom when she fell to the floor.  She is unsure of what caused her fall.  EMS reports family is concerned for altered mental status, patiently reported to be "incoherent" over the last few days.  She was evaluated in the ED on 12/15/2019 where she had negative CT of her head and neck, chest x-ray, and laboratory work-up which revealed possible pneumonia.  She was prescribed antibiotic, however family and the patient states that the pharmacy was backed up for 2 to 3 days and therefore they were unable to pick up the prescription until today.  She is only had 1 dose of her antibiotics, azithromycin and Keflex.  She endorses cough and feeling ill.  She also endorses some nausea without vomiting.  Denies diarrhea, fevers, chills, headache.  No known Covid exposures.  The history is provided by the patient and a relative.       Past Medical History:  Diagnosis Date  . Anxiety    anxiety and mild depressive order systoms  . Bipolar disorder ([place])     (02/20/2013)  . Cervical cancer ([place]) 03/19/1980  . Depression   . Dysrhythmia   . ZHYQMVHQ(469.6)    "monthly" (02/20/2013)  . Pacemaker    medtronic  . PONV (postoperative nausea and vomiting)   . Sinus pause 02/14/2013  . Syncope and collapse    echo-April 12,2012-EF 55% Echo normal; recorder with prolonged sinus pauses --> status post  . Tobacco abuse   . Vertigo     Patient Active Problem List   Diagnosis Date Noted  . Cervical spondylosis 09/23/2019  . Bilateral elbow joint pain 08/26/2019  . Memory loss 07/02/2019  .  Depression with anxiety 07/02/2019  . B12 deficiency 07/02/2019  . Lumbar radiculopathy 02/10/2019  . History of fusion of lumbar spine 02/10/2019  . Dysesthesia 02/10/2019  . Retroperitoneal fibrosis   . Pelvic adhesions   . Left ovarian cyst 11/17/2018  . Elevated tumor markers 11/17/2018  . Dyspnea 09/15/2018  . Chest pain of uncertain etiology 29/52/8413  . Laryngopharyngeal reflux (LPR) 05/19/2018  . Mood disorder ([place]) 04/16/2018  . Insomnia 04/16/2018  . Attention deficit hyperactivity disorder (ADHD) 04/16/2018  . Neck pain on right side 09/09/2014  . 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 Family history of arrhythmogenic right ventricular cardiomyopathy 03/30/2013  . 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 02/20/2013  . Sinus pause 02/14/2013  . Near syncope 10/29/2012  . Hx of syncope- s/p Loop recorder 11/06/12 10/29/2012  . Vertigo 10/29/2012  . Tobacco abuse 10/29/2012  . ANXIETY 11/14/2009  . CERUMEN IMPACTION, RIGHT 11/14/2009  . BENIGN POSITIONAL VERTIGO 11/14/2009  . EAR PAIN, RIGHT 11/14/2009  . SCIATICA 05/09/2009    Past Surgical History:  Procedure Laterality Date  . ABDOMINAL HYSTERECTOMY  03/19/1980   "partial"  . BACK SURGERY    . CERVICAL FUSION Left 11/2013   C4-C6  . CERVICAL FUSION     3,4,5,  . INSERT / REPLACE / REMOVE PACEMAKER  02/20/2013   medtronic  . LOOP RECORDER IMPLANT N/A  11/06/2012   Procedure: LINQ LOOP RECORDER IMPLANT;  Surgeon: Sanda Klein, MD;  Location: [facility] CATH LAB;  Service: Cardiovascular;  Laterality: N/A;  . NECK SURGERY  11/17/2013  . PACEMAKER PLACEMENT N/A 02/16/2013  . PERMANENT PACEMAKER INSERTION N/A 02/20/2013   Procedure: PERMANENT PACEMAKER INSERTION;  Surgeon: Sanda Klein, MD;  Location: [facility] CATH LAB;  Service: Cardiovascular;  Laterality: N/A;  . POSTERIOR LUMBAR FUSION  ~ 2009  . ROBOTIC ASSISTED SALPINGO OOPHERECTOMY Bilateral 11/25/2018   Procedure: XI ROBOTIC ASSISTED BILATERAL SALPINGO  OOPHORECTOMY and LYSIS OF ADHESIONS.;  Surgeon: Everitt Amber, MD;  Location: [facility] ORS;  Service: Gynecology;  Laterality: Bilateral;  . SPINAL FUSION  03/19/2014  . TONSILLECTOMY    . TRANSTHORACIC ECHOCARDIOGRAM  07/11/2010   The left atrial size is normal.There is no evidence of mitral vavle prolaspe.Right ventriicular systolic pressure is normal . Injection of contrast documented no interatrial shunt. Essentially normal 2D echo -doppler study      OB History   No obstetric history on file.     Family History  Adopted: Yes  Problem Relation Age of Onset  . Heart attack Brother     Social History   Tobacco Use  . Smoking status: Current Every Day Smoker    Packs/day: 0.50    Years: 40.00    Pack years: 20.00    Types: Cigarettes  . Smokeless tobacco: Never Used  . Tobacco comment: 14 cigarette per day  Vaping Use  . Vaping Use: Never used  Substance Use Topics  . Alcohol use: No  . Drug use: No    Home Medications Prior to Admission medications   Medication Sig Start Date End Date Taking? Authorizing Provider  ALPRAZolam Duanne Moron) 1 MG tablet Take 1 tablet (1 mg total) by mouth 3 (three) times daily as needed for anxiety. Patient taking differently: Take 1 mg by mouth 3 (three) times daily as needed for anxiety or sleep.  12/01/19   Donnal Moat T, PA-C  azithromycin (ZITHROMAX Z-PAK) 250 MG tablet 2 po day one, then 1 daily x 4 days Patient not taking: Reported on 12/17/2019 12/15/19   Drenda Freeze, MD  busPIRone (BUSPAR) 10 MG tablet TAKE 1 TABLET BY MOUTH THREE TIMES A DAY Patient not taking: Reported on 12/17/2019 11/02/19   Donnal Moat T, PA-C  cephALEXin (KEFLEX) 500 MG capsule Take 1 capsule (500 mg total) by mouth 3 (three) times daily. 12/15/19   Drenda Freeze, MD  [facility] B-12 500 MCG tablet Take 500 mcg by mouth daily. 11/27/19   [provider]  cyclobenzaprine (FLEXERIL) 10 MG tablet Take 10 mg by mouth daily as needed for muscle spasms.      [provider]  HYDROcodone-acetaminophen (NORCO) 10-325 MG tablet Take 1 tablet by mouth 3 (three) times daily as needed for moderate pain or severe pain.  11/28/19   [provider]  hydrOXYzine (VISTARIL) 25 MG capsule TAKE 1 CAPSULE (25 MG TOTAL) BY MOUTH 3 (THREE) TIMES DAILY AS NEEDED, may take 50-100 mg qhs prn sleep. Patient not taking: Reported on 12/17/2019 10/30/19   Addison Lank, PA-C  Melatonin 3-10 MG TABS Take 3-10 mg by mouth at bedtime as needed. 10/30/19   Donnal Moat T, PA-C  mirtazapine (REMERON) 15 MG tablet Take 22.5 mg by mouth at bedtime.  Patient not taking: Reported on 12/17/2019    [provider]  ondansetron (ZOFRAN ODT) 8 MG disintegrating tablet 8mg  ODT q4 hours prn nausea 11/07/19   Delo,  Nathaneil Canary, MD  Oxycodone HCl 10 MG TABS Take 10 mg by mouth 3 (three) times daily as needed (for pain).  Patient not taking: Reported on 12/17/2019 12/08/19   [provider]  risperiDONE (RISPERDAL) 3 MG tablet Take 1 tablet (3 mg total) by mouth at bedtime. 12/01/19   Donnal Moat T, PA-C  sertraline (ZOLOFT) 100 MG tablet Take 2 tablets (200 mg total) by mouth at bedtime. 10/30/19   Donnal Moat T, PA-C  thiamine (VITAMIN B-1) 100 MG tablet Take 100 mg by mouth daily. Patient not taking: Reported on 12/17/2019    [provider]  tiZANidine (ZANAFLEX) 4 MG tablet Take 4 mg by mouth 3 (three) times daily as needed for muscle spasms.  11/12/19   [provider]  zaleplon (SONATA) 10 MG capsule 1 po qhs prn sleep, and may repeat 1 for midnocturnal awakening as long as there are 3 hours left to sleep Patient not taking: Reported on 12/15/2019 10/30/19   Donnal Moat T, PA-C    Allergies    Penicillins  Review of Systems   Review of Systems  Respiratory: Positive for cough.   All other systems reviewed and are negative.   Physical Exam Updated Vital Signs BP (!) 131/116   Pulse 85   Temp 99 F (37.2 C) (Oral)   Resp (!) 25    Ht 5\' 7"  (1.702 m)   Wt 61.2 kg   SpO2 100%   BMI 21.14 kg/m   Physical Exam Vitals and nursing note reviewed.  Constitutional:      General: She is not in acute distress.    Appearance: She is well-developed.  HENT:     Head: Normocephalic and atraumatic.  Eyes:     Extraocular Movements: Extraocular movements intact.     Conjunctiva/sclera: Conjunctivae normal.     Pupils: Pupils are equal, round, and reactive to light.  Cardiovascular:     Rate and Rhythm: Normal rate and regular rhythm.  Pulmonary:     Effort: Pulmonary effort is normal. No respiratory distress.     Breath sounds: Wheezing and rhonchi present.  Abdominal:     General: Bowel sounds are normal.     Palpations: Abdomen is soft.     Tenderness: There is no abdominal tenderness.  Musculoskeletal:     Cervical back: Normal range of motion and neck supple.     Right lower leg: No edema.     Left lower leg: No edema.     Comments: Scabbed superficial abrasions to bilateral knees.  No drainage or significant surrounding swelling.  Normal range of motion of the knees and hips. Bilateral arms are nontender, appear atraumatic with normal range of motion.  Skin:    General: Skin is warm.  Neurological:     Mental Status: She is alert and oriented to person, place, and time.     Comments: Patient's speech is slow, though pt is alert and answering questions appropriately. CN grossly intact. Spontaneously moving all extremities without difficulty. Normal tone.  Psychiatric:        Behavior: Behavior normal.     ED Results / Procedures / Treatments   Labs (all labs ordered are listed, but only abnormal results are displayed) Labs Reviewed  COMPREHENSIVE METABOLIC PANEL - Abnormal; Notable for the following components:      Result Value   Potassium 3.3 (*)    Glucose, Bld 118 (*)    AST 92 (*)    ALT 53 (*)  All other components within normal limits  CBC WITH DIFFERENTIAL/PLATELET - Abnormal; Notable for  the following components:   RBC 5.16 (*)    HCT 46.4 (*)    All other components within normal limits  CULTURE, BLOOD (ROUTINE X 2)  CULTURE, BLOOD (ROUTINE X 2)  RESPIRATORY PANEL BY RT PCR (FLU A&B, COVID)  LACTIC ACID, PLASMA  LACTIC ACID, PLASMA  URINALYSIS, ROUTINE W REFLEX MICROSCOPIC  AMMONIA    EKG None  Radiology DG Chest Port 1 View  Result Date: 12/17/2019 CLINICAL DATA:  Recently diagnosed with pneumonia, increasing lethargy EXAM: PORTABLE CHEST 1 VIEW COMPARISON:  Radiograph 12/15/2019 CT 04/20/2019 FINDINGS: Low lung volumes with streaky opacities in the lung bases which likely reflect atelectasis though early atypical infection or edema could have a similar appearance. No focal consolidation. Blunting of the left costophrenic sulcus could reflect development of a trace left effusion. No right effusion or pneumothorax. Pacer pack overlies the left chest wall with leads in stable position at the right atrium and cardiac apex. The aorta is calcified. The remaining cardiomediastinal contours are unremarkable. No acute osseous or soft tissue abnormality. Prior cervical fusion, incompletely assessed on this exam. Telemetry leads overlie the chest. IMPRESSION: Low lung volumes with streaky opacities in the lung bases which likely reflect atelectasis though early atypical infection or edema could have a similar appearance. New blunting of left costophrenic sulcus may reflect trace effusion. Aortic Atherosclerosis (ICD10-I70.0). Electronically Signed   By: Lovena Le M.D.   On: 12/17/2019 21:27    Procedures Procedures (including critical care time)  Medications Ordered in ED Medications - No data to display  ED Course  I have reviewed the triage vital signs and the nursing notes.  Pertinent labs & imaging results that were available during my care of the patient were reviewed by me and considered in my medical decision making (see chart for details).  Clinical Course as of  Dec 17 2354  Thu Dec 17, 2019  2322 Discussed with patient's daughter, [name].  She states patient has been "incoherent" for multiple days now.  She is asking where her husband is, however her husband has been deceased for 5 years.  Her daughter states she has been frequently asking her orientation questions and the president continues to change.  She states she has fallen multiple times over the last week.  Her daughter states she is unable to care for herself at home.  Her medications have been significantly reduced over the last few days with concern for polypharmacy however this has not provided any improvement.  She is concerned for her wellbeing and is strongly requesting admission.  Her phone number is [phone].  Will update patient's daughter with further results.   [JR]    Clinical Course User Index [JR] Kaedynce Tapp, [place] N, PA-C   MDM Rules/Calculators/A&P                          [name] was evaluated in Emergency Department on 12/18/2019 for the symptoms described in the history of present illness. She was evaluated in the context of the global COVID-19 pandemic, which necessitated consideration that the patient might be at risk for infection with the SARS-CoV-2 virus that causes COVID-19. Institutional protocols and algorithms that pertain to the evaluation of patients at risk for COVID-19 are in a state of rapid change based on information released by regulatory bodies including the CDC and federal and state organizations. These policies  and algorithms were followed during the patient's care in the ED.  Brought in by EMS from home for altered mental status and fall.  Presentation as above in HPI.   Per chart review, patient was evaluated via televisit by her psychiatry PA, per PA documentation it appears there was some confusion on patient's medications.  Medication adjustment was done however in conversation with patient's daughter, patient's daughter does not agree with some of  these medication adjustments. Patient's daughter states that the patient has been quite confused over the last few days.  She has had multiple falls.  She states a few days ago, EMS arrived to her house and noted 3 different types of muscle relaxers on her nightstand as well as oxycodone and hydrocodone.  She is also prescribed Xanax, Zoloft, risperidone.  Patient's daughter believes that her altered mental status is not due to her medications as they have significantly decreased her medications over the last few days, she states they have taken away most of her medications and are only giving her half a dose of hydrocodone for her chronic pain, and Flexeril, a decreased dose of Xanax, and she is not taking medications for sleep.  Patient's daughter states that she is disoriented with some intermittent lucid periods though is having frequent falls.  She is concerned for her safety at home as she lives alone and is strongly requesting admission.   On evaluation, patient is alert and oriented, answering questions appropriately.  Her speech is somewhat slowed though speech is not slurred, no aphasia is present.  She is answering questions appropriately, able to provide history.  She is afebrile with normal heart rate and blood pressure.  She is noted to be slightly tachypneic though with normal effort.  Lungs with wheezes and rhonchi, greater on the left.  No evidence of acute injury today though does have some scabbed superficial abrasions to her knees. Pt does not meet sepsis criteria.  Labs today reveal significantly improved white count from 17.9 down to 10.4 today.  Absolute neutrophils are now within normal limits. Unlikely sepsis with improving WBC and abs neutrophils.  Liver enzymes remain slightly elevated, slight improvement in AST.  Will add on alcohol and ammonia level.  Renal function within normal limits.  Lactic acid is also normal of 1.6.  Chest x-ray with nonspecific findings, may reflect  pneumonia.  ECG is unchanged.  Pending UA, Covid test, ammonia and ethanol, care handed off at shift change to PA McDonald.  Plan is to follow laboratory results, reevaluate and determine disposition.  Consider admission for altered mental status.  Believe this is multifactorial including polypharmacy, however as daughter states medications have been drastically reduced over the last couple of days without improvement in mental status, concern there may be underlying cause? If remainder of workup is negative.   Final Clinical Impression(s) / ED Diagnoses Final diagnoses:  None    Rx / DC Orders ED Discharge Orders    None       Lila Lufkin, [place] N, PA-C 12/18/19 0021    Doni Bacha, [place] N, PA-C 12/18/19 Luberta Robertson, MD 12/18/19 1539

## 2019-12-17 NOTE — Telephone Encounter (Signed)
Pt c/o of Chest Pain: STAT if CP now or developed within 24 hours  1. Are you having CP right now? no  2. Are you experiencing any other symptoms (ex. SOB, nausea, vomiting, sweating)? Short of breath and nausea, but not at this time  3. How long have you been experiencing CP? About a month- been to ER twice  4. Is your CP continuous or coming and going? Comes and goes  5. Have you taken Nitroglycerin? Yes at the hospital ER-  she was told to follow up with her Cardiologist- said her pacemaker is not working correctly-First available appt was a Chief of Staff with [facility] on 12-21-19- Please call to evaluate ?

## 2019-12-17 NOTE — Telephone Encounter (Signed)
Remote transmission received 12/17/19.   Called patient to assess. Patient reports of shortness of breath, fever, chills, nausea and unable to eat or drink. Denies vomiting or diarrhea. Recently seen in ED, diagnosed with pneumonia, has not filled prescriptions.   Consulted with co-worker Jenny Reichmann, Therapist, sports and we are both in agreeance that patient should go back to the ED for evaluation. Expressed to patient concern for worsening and she could become very ill if not evaluated.  Presenting rhythm: AS/VS 117. Device appears to be appropriately with stable impedance, sensing and thresholds.   Patient advised to not drive, have someone drive her or call [phone]. Verbalized understanding.

## 2019-12-17 NOTE — ED Provider Notes (Signed)
64 year old female received at signout from [facility] pending labs. Per her HPI:   "[name] is a 64 y.o. female with past medical history of anxiety, chronic pain, bipolar disorder, cervical cancer, presenting to the ED via EMS for altered mental status and fall.  Patient states she was tidying up in her bathroom when she fell to the floor.  She is unsure of what caused her fall.  EMS reports family is concerned for altered mental status, patiently reported to be "incoherent" over the last few days.  She was evaluated in the ED on 12/15/2019 where she had negative CT of her head and neck, chest x-ray, and laboratory work-up which revealed possible pneumonia.  She was prescribed antibiotic, however family and the patient states that the pharmacy was backed up for 2 to 3 days and therefore they were unable to pick up the prescription until today.  She is only had 1 dose of her antibiotics, azithromycin and Keflex.  She endorses cough and feeling ill.  She also endorses some nausea without vomiting.  Denies diarrhea, fevers, chills, headache.  No known Covid exposures."  --------- On my evaluation, patient reports that she is having left-sided breast and rib pain and left flank pain.  She is also endorsing chills for the last few days, which is around the time she became more confused and began falling.  She notes that her daughter has been asking her questions that she has not been able to answer over the last few days such as what color is this or what day of the week is this.  Per chart review: Video visit note on 9/30 "As I was reviewing the patient's medications, there is confusion with her psych medications.  According to my records at the last visit she is supposed to be on BuSpar but she and her daughter state she is not.  Of course understandably the [employer] has been stopped.  She is not taking hydroxyzine when I thought she was.  Also thought she was taking the mirtazapine but she is not.  She  is taking Zoloft and Risperdal as directed, as well as the Xanax."  Physical Exam  BP 120/83 (BP Location: Left Arm)   Pulse 84   Temp 99 F (37.2 C) (Oral)   Resp (!) 24   Ht 5\' 7"  (1.702 m)   Wt 61.2 kg   SpO2 98%   BMI 21.14 kg/m   Physical Exam Vitals and nursing note reviewed.  Constitutional:      General: She is not in acute distress. HENT:     Head: Normocephalic.  Eyes:     Conjunctiva/sclera: Conjunctivae normal.  Cardiovascular:     Rate and Rhythm: Normal rate and regular rhythm.     Heart sounds: No murmur heard.  No friction rub. No gallop.   Pulmonary:     Effort: Pulmonary effort is normal. No respiratory distress.  Abdominal:     General: There is no distension.     Palpations: Abdomen is soft.  Musculoskeletal:     Cervical back: Neck supple.  Skin:    General: Skin is warm.     Findings: No rash.  Neurological:     Mental Status: She is alert.     Comments: Alert and oriented x4.  Speech is not slurred.  She does have some intermittent confusion about the general details over the last few days.  Psychiatric:        Behavior: Behavior normal.  ED Course/Procedures   Clinical Course as of Dec 18 634  Thu Dec 17, 2019  2322 Discussed with patient's daughter, [name].  She states patient has been "incoherent" for multiple days now.  She is asking where her husband is, however her husband has been deceased for 5 years.  Her daughter states she has been frequently asking her orientation questions and the president continues to change.  She states she has fallen multiple times over the last week.  Her daughter states she is unable to care for herself at home.  Her medications have been significantly reduced over the last few days with concern for polypharmacy however this has not provided any improvement.  She is concerned for her wellbeing and is strongly requesting admission.  Her phone number is [phone].  Will update patient's daughter with further  results.   [JR]    Clinical Course User Index [JR] Robinson, [place] N, PA-C    Procedures  MDM   64 year old female received at signout from [facility] pending labs.  In brief, patient has been having frequent falls and confusion over the last few days.  She also endorses chills, cough and was seen in the ER on 9/28 and was diagnosed with pneumonia.  She received a dose of Rocephin, but did not fill any other antibiotics.  All of the somnolence are complicated by polypharmacy and significant confusion over which prescriptions the medication has been taking and there is concerned that somnolence may be also contributing to her symptoms.  The patient was discussed with Dr. Leonette Monarch, attending physician.  Repeat chest x-ray concerning for pneumonia.  During her last ER visit she presented with tachycardia and a leukocytosis of 17.  These have since resolved, but she continues to endorse chills.  Lactate has been up trending 1.6-> 2.0, which was treated with IV fluids and a third lactate is pending.  We will send blood cultures today and question occult sepsis contributing to her encephalopathy.  She has been started on Rocephin and azithromycin in the ER for community-acquired pneumonia.  COVID-19 test is negative.  Also considered other sources of confusion and altered mental status.  Ammonia is not elevated.  The patient does not drink alcohol.  Notably, her transaminases are 92 AST and 53 ALT, compared to 152 and 43 on 9/28.  No abdominal pain.  Low suspicion for hepatic encephalopathy.  Patient's urinalysis is pending as she does have a history of recurrent UTIs.  Consults to the hospitalist team and Dr. Daryll Brod will accept the patient for admission.  She would likely benefit from having her medications reviewed during her hospitalization based on recent video visit with behavioral health yesterday.  I have updated the patient's daughter [name] and she does have concerns since the patient does live  alone has been frequently falling.  She may benefit from PT/OT evaluation.  The patient appears reasonably stabilized for admission considering the current resources, flow, and capabilities available in the ED at this time, and I doubt any other [facility] requiring further screening and/or treatment in the ED prior to admission.     Joanne Gavel, PA-C 12/18/19 0636    Fatima Blank, MD 12/18/19 [phone]

## 2019-12-17 NOTE — Telephone Encounter (Signed)
Called to assist patient with sending transmission.   Patient states she is unable to locate her remote box. Her daughter is on the way to help her find it. Advised to please call after sending the transmission or if she is not able to locate box. Verbalized understanding.

## 2019-12-17 NOTE — Telephone Encounter (Signed)
Spoke with pharmacy and cx the rx on 9/14.

## 2019-12-18 ENCOUNTER — Inpatient Hospital Stay (HOSPITAL_COMMUNITY): Payer: Medicare HMO

## 2019-12-18 DIAGNOSIS — R7989 Other specified abnormal findings of blood chemistry: Secondary | ICD-10-CM | POA: Diagnosis not present

## 2019-12-18 DIAGNOSIS — W19XXXD Unspecified fall, subsequent encounter: Secondary | ICD-10-CM

## 2019-12-18 DIAGNOSIS — Z8249 Family history of ischemic heart disease and other diseases of the circulatory system: Secondary | ICD-10-CM | POA: Diagnosis not present

## 2019-12-18 DIAGNOSIS — G8929 Other chronic pain: Secondary | ICD-10-CM | POA: Diagnosis present

## 2019-12-18 DIAGNOSIS — R945 Abnormal results of liver function studies: Secondary | ICD-10-CM | POA: Diagnosis not present

## 2019-12-18 DIAGNOSIS — Z720 Tobacco use: Secondary | ICD-10-CM | POA: Diagnosis not present

## 2019-12-18 DIAGNOSIS — D500 Iron deficiency anemia secondary to blood loss (chronic): Secondary | ICD-10-CM

## 2019-12-18 DIAGNOSIS — W1830XA Fall on same level, unspecified, initial encounter: Secondary | ICD-10-CM | POA: Diagnosis not present

## 2019-12-18 DIAGNOSIS — E876 Hypokalemia: Secondary | ICD-10-CM | POA: Diagnosis not present

## 2019-12-18 DIAGNOSIS — R55 Syncope and collapse: Secondary | ICD-10-CM

## 2019-12-18 DIAGNOSIS — J189 Pneumonia, unspecified organism: Secondary | ICD-10-CM | POA: Diagnosis not present

## 2019-12-18 DIAGNOSIS — Z8541 Personal history of malignant neoplasm of cervix uteri: Secondary | ICD-10-CM | POA: Diagnosis not present

## 2019-12-18 DIAGNOSIS — E872 Acidosis: Secondary | ICD-10-CM | POA: Diagnosis not present

## 2019-12-18 DIAGNOSIS — Y92012 Bathroom of single-family (private) house as the place of occurrence of the external cause: Secondary | ICD-10-CM | POA: Diagnosis not present

## 2019-12-18 DIAGNOSIS — F17210 Nicotine dependence, cigarettes, uncomplicated: Secondary | ICD-10-CM | POA: Diagnosis present

## 2019-12-18 DIAGNOSIS — Z981 Arthrodesis status: Secondary | ICD-10-CM | POA: Diagnosis not present

## 2019-12-18 DIAGNOSIS — D519 Vitamin B12 deficiency anemia, unspecified: Secondary | ICD-10-CM | POA: Diagnosis not present

## 2019-12-18 DIAGNOSIS — Z79899 Other long term (current) drug therapy: Secondary | ICD-10-CM | POA: Diagnosis not present

## 2019-12-18 DIAGNOSIS — G9340 Encephalopathy, unspecified: Secondary | ICD-10-CM | POA: Diagnosis not present

## 2019-12-18 DIAGNOSIS — F319 Bipolar disorder, unspecified: Secondary | ICD-10-CM | POA: Diagnosis present

## 2019-12-18 DIAGNOSIS — G9341 Metabolic encephalopathy: Secondary | ICD-10-CM | POA: Diagnosis not present

## 2019-12-18 DIAGNOSIS — W19XXXA Unspecified fall, initial encounter: Secondary | ICD-10-CM | POA: Diagnosis present

## 2019-12-18 DIAGNOSIS — R410 Disorientation, unspecified: Secondary | ICD-10-CM | POA: Diagnosis present

## 2019-12-18 DIAGNOSIS — E538 Deficiency of other specified B group vitamins: Secondary | ICD-10-CM | POA: Diagnosis not present

## 2019-12-18 DIAGNOSIS — R69 Illness, unspecified: Secondary | ICD-10-CM | POA: Diagnosis not present

## 2019-12-18 DIAGNOSIS — Z950 Presence of cardiac pacemaker: Secondary | ICD-10-CM | POA: Diagnosis not present

## 2019-12-18 DIAGNOSIS — R296 Repeated falls: Secondary | ICD-10-CM | POA: Diagnosis not present

## 2019-12-18 DIAGNOSIS — Z880 Allergy status to penicillin: Secondary | ICD-10-CM | POA: Diagnosis not present

## 2019-12-18 DIAGNOSIS — Z20822 Contact with and (suspected) exposure to covid-19: Secondary | ICD-10-CM | POA: Diagnosis not present

## 2019-12-18 DIAGNOSIS — F419 Anxiety disorder, unspecified: Secondary | ICD-10-CM | POA: Diagnosis present

## 2019-12-18 DIAGNOSIS — G4700 Insomnia, unspecified: Secondary | ICD-10-CM | POA: Diagnosis present

## 2019-12-18 LAB — ECHOCARDIOGRAM COMPLETE
AR max vel: 2.26 cm2
AV Area VTI: 2.31 cm2
AV Area mean vel: 1.93 cm2
AV Mean grad: 4 mmHg
AV Peak grad: 6.6 mmHg
Ao pk vel: 1.28 m/s
Area-P 1/2: 4.06 cm2
Height: 67 in
S' Lateral: 2.5 cm
Weight: 2194.02 oz

## 2019-12-18 LAB — AMMONIA: Ammonia: 17 umol/L (ref 9–35)

## 2019-12-18 LAB — TROPONIN I (HIGH SENSITIVITY)
Troponin I (High Sensitivity): 7 ng/L (ref <18)
Troponin I (High Sensitivity): 6 ng/L (ref <18)

## 2019-12-18 LAB — URINALYSIS, ROUTINE W REFLEX MICROSCOPIC
Bilirubin Urine: NEGATIVE
Glucose, UA: NEGATIVE mg/dL
Hgb urine dipstick: NEGATIVE
Ketones, ur: 5 mg/dL — AB
Leukocytes,Ua: NEGATIVE
Nitrite: NEGATIVE
Protein, ur: NEGATIVE mg/dL
Specific Gravity, Urine: 1.021 (ref 1.005–1.030)
pH: 5 (ref 5.0–8.0)

## 2019-12-18 LAB — ETHANOL: Alcohol, Ethyl (B): <10 mg/dL (ref <10)

## 2019-12-18 LAB — LACTIC ACID, PLASMA
Lactic Acid, Venous: 2 mmol/L (ref 0.5–1.9)
Lactic Acid, Venous: 1.7 mmol/L (ref 0.5–1.9)
Lactic Acid, Venous: 0.9 mmol/L (ref 0.5–1.9)

## 2019-12-18 LAB — CBC
HCT: 36.8 % (ref 36.0–46.0)
Hemoglobin: 11.5 g/dL — ABNORMAL LOW (ref 12.0–15.0)
MCH: 28.3 pg (ref 26.0–34.0)
MCHC: 31.3 g/dL (ref 30.0–36.0)
MCV: 90.4 fL (ref 80.0–100.0)
Platelets: 276 10*3/uL (ref 150–400)
RBC: 4.07 MIL/uL (ref 3.87–5.11)
RDW: 14 % (ref 11.5–15.5)
WBC: 8.8 10*3/uL (ref 4.0–10.5)
nRBC: 0 % (ref 0.0–0.2)

## 2019-12-18 LAB — CREATININE, SERUM
Creatinine, Ser: 0.7 mg/dL (ref 0.44–1.00)
GFR calc Af Amer: >60 mL/min (ref >60)
GFR calc non Af Amer: >60 mL/min (ref >60)

## 2019-12-18 LAB — HIV ANTIBODY (ROUTINE TESTING W REFLEX): HIV Screen 4th Generation wRfx: NONREACTIVE

## 2019-12-18 LAB — IRON AND TIBC
Iron: 33 ug/dL (ref 28–170)
Saturation Ratios: 15 % (ref 10.4–31.8)
TIBC: 221 ug/dL — ABNORMAL LOW (ref 250–450)
UIBC: 188 ug/dL

## 2019-12-18 LAB — FERRITIN: Ferritin: 110 ng/mL (ref 11–307)

## 2019-12-18 LAB — RETICULOCYTES
Immature Retic Fract: 5.5 % (ref 2.3–15.9)
RBC.: 4.05 MIL/uL (ref 3.87–5.11)
Retic Count, Absolute: 40.1 10*3/uL (ref 19.0–186.0)
Retic Ct Pct: 1 % (ref 0.4–3.1)

## 2019-12-18 LAB — BRAIN NATRIURETIC PEPTIDE: B Natriuretic Peptide: 86.2 pg/mL (ref 0.0–100.0)

## 2019-12-18 LAB — TYPE AND SCREEN
ABO/RH(D): A POS
Antibody Screen: NEGATIVE

## 2019-12-18 LAB — VITAMIN B12: Vitamin B-12: 146 pg/mL — ABNORMAL LOW (ref 180–914)

## 2019-12-18 LAB — FOLATE: Folate: 6.4 ng/mL (ref >5.9)

## 2019-12-18 LAB — SEDIMENTATION RATE: Sed Rate: 22 mm/hr (ref 0–22)

## 2019-12-18 LAB — TSH: TSH: 1.721 u[IU]/mL (ref 0.350–4.500)

## 2019-12-18 LAB — T4, FREE: Free T4: 1.33 ng/dL — ABNORMAL HIGH (ref 0.61–1.12)

## 2019-12-18 MED ORDER — SODIUM CHLORIDE 0.9 % IV SOLN
1.0000 g | Freq: Once | INTRAVENOUS | Status: AC
  Administered 2019-12-18: 1 g via INTRAVENOUS
  Filled 2019-12-18: qty 10

## 2019-12-18 MED ORDER — SODIUM CHLORIDE 0.9 % IV BOLUS
1000.0000 mL | Freq: Once | INTRAVENOUS | Status: AC
  Administered 2019-12-18: 1000 mL via INTRAVENOUS

## 2019-12-18 MED ORDER — PANTOPRAZOLE SODIUM 40 MG IV SOLR
40.0000 mg | INTRAVENOUS | Status: DC
  Administered 2019-12-18 – 2019-12-20 (×3): 40 mg via INTRAVENOUS
  Filled 2019-12-18 (×3): qty 40

## 2019-12-18 MED ORDER — ALPRAZOLAM 0.5 MG PO TABS
1.0000 mg | ORAL_TABLET | Freq: Three times a day (TID) | ORAL | Status: DC | PRN
  Administered 2019-12-18: 1 mg via ORAL
  Filled 2019-12-18: qty 2

## 2019-12-18 MED ORDER — ENOXAPARIN SODIUM 30 MG/0.3ML ~~LOC~~ SOLN
30.0000 mg | SUBCUTANEOUS | Status: DC
  Administered 2019-12-18: 30 mg via SUBCUTANEOUS
  Filled 2019-12-18: qty 0.3

## 2019-12-18 MED ORDER — SODIUM CHLORIDE 0.9% FLUSH
3.0000 mL | Freq: Two times a day (BID) | INTRAVENOUS | Status: DC
  Administered 2019-12-18 – 2019-12-20 (×4): 3 mL via INTRAVENOUS

## 2019-12-18 MED ORDER — METOPROLOL TARTRATE 5 MG/5ML IV SOLN: 5.0000 mg | Freq: Four times a day (QID) | INTRAVENOUS | Status: DC | PRN

## 2019-12-18 MED ORDER — SODIUM CHLORIDE 0.9 % IV SOLN
500.0000 mg | INTRAVENOUS | Status: DC
  Administered 2019-12-19 – 2019-12-21 (×3): 500 mg via INTRAVENOUS
  Filled 2019-12-18 (×3): qty 500

## 2019-12-18 MED ORDER — NICOTINE 21 MG/24HR TD PT24
21.0000 mg | MEDICATED_PATCH | Freq: Every day | TRANSDERMAL | Status: DC
  Administered 2019-12-18 – 2019-12-21 (×4): 21 mg via TRANSDERMAL
  Filled 2019-12-18 (×4): qty 1

## 2019-12-18 MED ORDER — SODIUM CHLORIDE 0.9% FLUSH: 3.0000 mL | INTRAVENOUS | Status: DC | PRN

## 2019-12-18 MED ORDER — ENOXAPARIN SODIUM 40 MG/0.4ML ~~LOC~~ SOLN
40.0000 mg | SUBCUTANEOUS | Status: DC
  Administered 2019-12-19 – 2019-12-21 (×3): 40 mg via SUBCUTANEOUS
  Filled 2019-12-18 (×3): qty 0.4

## 2019-12-18 MED ORDER — SODIUM CHLORIDE 0.9 % IV SOLN
500.0000 mg | Freq: Once | INTRAVENOUS | Status: AC
  Administered 2019-12-18: 500 mg via INTRAVENOUS
  Filled 2019-12-18: qty 500

## 2019-12-18 MED ORDER — SODIUM CHLORIDE 0.9 % IV SOLN
1.0000 g | INTRAVENOUS | Status: DC
  Administered 2019-12-19 – 2019-12-21 (×3): 1 g via INTRAVENOUS
  Filled 2019-12-18 (×3): qty 10

## 2019-12-18 MED ORDER — CYANOCOBALAMIN 1000 MCG/ML IJ SOLN
1000.0000 ug | Freq: Every day | INTRAMUSCULAR | Status: AC
  Administered 2019-12-18 – 2019-12-20 (×3): 1000 ug via INTRAMUSCULAR
  Filled 2019-12-18 (×3): qty 1

## 2019-12-18 NOTE — H&P (Signed)
History and Physical    [name] HYW:[id number] DOB: [birth year] DOA: 12/17/2019  PCP: Prince Solian, MD    Patient coming from:  Home   Chief Complaint: Shortness of breath   HPI: [name] is a 64 y.o. female with medical history significant of tobacco abuse, syncope, frequent falls, [employer]tronic pacemaker Seen in the emergency room for left-sided chest pain left upper extremity pain left lower extremity pain after her fall on Wednesday. Patient states that when she fell on Wednesday she was doing house chores.  By Friday when she could not move she went to her primary care Patient was also seen in the ED and given antibiotics that she had not started.  Chart review showed [employer] check telephone call On the 30th when patient had complaints of shortness of breath cough and fever.  Per ED MD note patient did report cough shortness of breath and fever. To me however today patient just reports left-sided chest pain states that she fell after falling went to her primary care and since her fall has been having pain on  left side of the chest left upper extremity left lower extremity.  Overall chart review I suspect patient has either orthostatic hypotension , and pt needs eval for  Orthostatic changes/ with comprehensive labs .  Also history is a history of B12 deficiency which can cause neuro symptoms.  Unsure of peripheral neuropathy Is playing a part in this patient's repeated history of syncope and falls. .  ED Course:  Blood pressure 128/82, pulse 81, temperature 99 F (37.2 C), temperature source Oral, resp. rate (!) 26, height 5\' 7"  (1.702 m), weight 61.2 kg, SpO2 96 %.    Review of Systems: As per HPI otherwise all systems reviewed and negative.  Past Medical History:  Diagnosis Date  . Anxiety    anxiety and mild depressive order systoms  . Bipolar disorder ([place])     (02/20/2013)  . Cervical cancer ([place]) 03/19/1980  . Depression   . Dysrhythmia   .  SWNIOEVO(350.0)    "monthly" (02/20/2013)  . Pacemaker    medtronic  . PONV (postoperative nausea and vomiting)   . Sinus pause 02/14/2013  . Syncope and collapse    echo-April 12,2012-EF 55% Echo normal; recorder with prolonged sinus pauses --> status post  . Tobacco abuse   . Vertigo     Past Surgical History:  Procedure Laterality Date  . ABDOMINAL HYSTERECTOMY  03/19/1980   "partial"  . BACK SURGERY    . CERVICAL FUSION Left 11/2013   C4-C6  . CERVICAL FUSION     3,4,5,  . INSERT / REPLACE / REMOVE PACEMAKER  02/20/2013   medtronic  . LOOP RECORDER IMPLANT N/A 11/06/2012   Procedure: LINQ LOOP RECORDER IMPLANT;  Surgeon: Sanda Klein, MD;  Location: [facility];  Service: Cardiovascular;  Laterality: N/A;  . NECK SURGERY  11/17/2013  . PACEMAKER PLACEMENT N/A 02/16/2013  . PERMANENT PACEMAKER INSERTION N/A 02/20/2013   Procedure: PERMANENT PACEMAKER INSERTION;  Surgeon: Sanda Klein, MD;  Location: [facility];  Service: Cardiovascular;  Laterality: N/A;  . POSTERIOR LUMBAR FUSION  ~ 2009  . ROBOTIC ASSISTED SALPINGO OOPHERECTOMY Bilateral 11/25/2018   Procedure: XI ROBOTIC ASSISTED BILATERAL SALPINGO OOPHORECTOMY and LYSIS OF ADHESIONS.;  Surgeon: Everitt Amber, MD;  Location: [facility];  Service: Gynecology;  Laterality: Bilateral;  . SPINAL FUSION  03/19/2014  . TONSILLECTOMY    . TRANSTHORACIC ECHOCARDIOGRAM  07/11/2010   The left atrial size is  normal.There is no evidence of mitral vavle prolaspe.Right ventriicular systolic pressure is normal . Injection of contrast documented no interatrial shunt. Essentially normal 2D echo -doppler study      reports that she has been smoking cigarettes. She has a 20.00 pack-year smoking history. She has never used smokeless tobacco. She reports that she does not drink alcohol and does not use drugs.  Allergies  Allergen Reactions  . Penicillins Other (See Comments)    Yeast infection Did it involve swelling of the face/tongue/throat,  SOB, or low BP? No Did it involve sudden or severe rash/hives, skin peeling, or any reaction on the inside of your mouth or nose? No Did you need to seek medical attention at a hospital or doctor's office? Yes When did it last happen? More than 5 years ago If all above answers are "NO", may proceed with cephalosporin use.     Family History  Adopted: Yes  Problem Relation Age of Onset  . Heart attack Brother     Prior to Admission medications   Medication Sig Start Date End Date Taking? Authorizing Provider  ALPRAZolam Duanne Moron) 1 MG tablet Take 1 tablet (1 mg total) by mouth 3 (three) times daily as needed for anxiety. Patient taking differently: Take 1 mg by mouth 3 (three) times daily as needed for anxiety or sleep.  12/01/19  Yes Hurst, Helene Kelp T, PA-C  busPIRone (BUSPAR) 10 MG tablet TAKE 1 TABLET BY MOUTH THREE TIMES A DAY Patient taking differently: Take 10 mg by mouth 3 (three) times daily.  11/02/19  Yes Hurst, Helene Kelp T, PA-C  cephALEXin (KEFLEX) 500 MG capsule Take 1 capsule (500 mg total) by mouth 3 (three) times daily. 12/15/19  Yes Drenda Freeze, MD  [facility] B-12 500 MCG tablet Take 500 mcg by mouth daily. 11/27/19  Yes [provider]  cyclobenzaprine (FLEXERIL) 10 MG tablet Take 10 mg by mouth daily as needed for muscle spasms.    Yes [provider]  HYDROcodone-acetaminophen (NORCO) 10-325 MG tablet Take 1 tablet by mouth 3 (three) times daily as needed for moderate pain or severe pain.  11/28/19  Yes [provider]  Melatonin 3-10 MG TABS Take 3-10 mg by mouth at bedtime as needed. Patient taking differently: Take 3-10 mg by mouth at bedtime as needed (sleep).  10/30/19  Yes Hurst, Helene Kelp T, PA-C  ondansetron (ZOFRAN ODT) 8 MG disintegrating tablet 8mg  ODT q4 hours prn nausea Patient taking differently: Take 8 mg by mouth every 4 (four) hours as needed for nausea. 8mg  ODT q4 hours prn nausea 11/07/19  Yes Delo, Nathaneil Canary, MD  risperiDONE (RISPERDAL) 3  MG tablet Take 1 tablet (3 mg total) by mouth at bedtime. 12/01/19  Yes Donnal Moat T, PA-C  sertraline (ZOLOFT) 100 MG tablet Take 2 tablets (200 mg total) by mouth at bedtime. 10/30/19  Yes Hurst, Helene Kelp T, PA-C  tiZANidine (ZANAFLEX) 4 MG tablet Take 4 mg by mouth 3 (three) times daily as needed for muscle spasms.  11/12/19  Yes [provider]  azithromycin (ZITHROMAX Z-PAK) 250 MG tablet 2 po day one, then 1 daily x 4 days 12/15/19   Drenda Freeze, MD  fluconazole (DIFLUCAN) 150 MG tablet Take 150 mg by mouth once. 12/17/19   [provider]  hydrOXYzine (VISTARIL) 25 MG capsule TAKE 1 CAPSULE (25 MG TOTAL) BY MOUTH 3 (THREE) TIMES DAILY AS NEEDED, may take 50-100 mg qhs prn sleep. Patient not taking: Reported on 12/17/2019 10/30/19   Donnal Moat  T, PA-C  mirtazapine (REMERON) 15 MG tablet Take 22.5 mg by mouth at bedtime.  Patient not taking: Reported on 12/17/2019    [provider]  Oxycodone HCl 10 MG TABS Take 10 mg by mouth 3 (three) times daily as needed (for pain).  Patient not taking: Reported on 12/17/2019 12/08/19   [provider]  zaleplon (SONATA) 10 MG capsule 1 po qhs prn sleep, and may repeat 1 for midnocturnal awakening as long as there are 3 hours left to sleep Patient not taking: Reported on 12/15/2019 10/30/19   Addison Lank, PA-C    Physical Exam: Vitals:   12/18/19 1000 12/18/19 1030 12/18/19 1100 12/18/19 1130  BP: 115/81 (!) 113/92 116/84 128/82  Pulse: 74 86 85 81  Resp: 13 (!) 27 (!) 25 (!) 26  Temp:      TempSrc:      SpO2: 96% 97% 96% 96%  Weight:      Height:        Constitutional: NAD, calm, comfortable Vitals:   12/18/19 1000 12/18/19 1030 12/18/19 1100 12/18/19 1130  BP: 115/81 (!) 113/92 116/84 128/82  Pulse: 74 86 85 81  Resp: 13 (!) 27 (!) 25 (!) 26  Temp:      TempSrc:      SpO2: 96% 97% 96% 96%  Weight:      Height:       Eyes: PERRL, EOMI lids and conjunctivae normal ENMT: Mucous membranes are  moist. Posterior pharynx clear of any exudate or lesions.Normal dentition.  Neck: normal, supple, no masses, no thyromegaly, no carotid bruit . Respiratory: clear to auscultation bilaterally, no wheezing, no crackles. Normal respiratory effort. No accessory muscle use.  Cardiovascular: Regular rate and rhythm, no murmurs / rubs / gallops. No extremity edema. 2+ pedal pulses. No carotid bruits.  Abdomen: no tenderness, no masses palpated. No hepatosplenomegaly. Bowel sounds positive.  Musculoskeletal: no clubbing / cyanosis. No joint deformity upper and lower extremities. Pt moving all four ext , no contractures. Normal muscle tone.  Skin: no rashes, lesions, ulcers. No induration Neurologic: CN 2-12 grossly intact. Strength 5/5 in all 4.  Psychiatric: Normal judgment and insight. Alert and oriented x 3. Normal mood.   Labs on Admission: I have personally reviewed following labs and imaging studies  CBC: Recent Labs  Lab 12/15/19 2027 12/15/19 2035 12/17/19 2114 12/18/19 1015  WBC 17.9*  --  10.4 8.8  NEUTROABS 15.5*  --  6.6  --   HGB 15.2* 16.0* 14.9 11.5*  HCT 46.4* 47.0* 46.4* 36.8  MCV 87.7  --  89.9 90.4  PLT 300  --  362 353   Basic Metabolic Panel: Recent Labs  Lab 12/15/19 2027 12/15/19 2035 12/17/19 2114 12/18/19 1015  NA 136 136 141  --   K 4.2 4.1 3.3*  --   CL 103 104 107  --   CO2 18*  --  22  --   GLUCOSE 97 91 118*  --   BUN 13 11 8   --   CREATININE 1.12* 1.00 0.91 0.70  CALCIUM 9.3  --  9.8  --    GFR: Estimated Creatinine Clearance: 70.4 mL/min (by C-G formula based on SCr of 0.7 mg/dL). Liver Function Tests: Recent Labs  Lab 12/15/19 2027 12/17/19 2114  AST 152* 92*  ALT 43 53*  ALKPHOS 51 55  BILITOT 1.9* 0.9  PROT 7.3 7.1  ALBUMIN 4.0 4.0   No results for input(s): LIPASE, AMYLASE in the last 168  hours. Recent Labs  Lab 12/18/19 0250  AMMONIA 17   Coagulation Profile: No results for input(s): INR, PROTIME in the last 168  hours. Cardiac Enzymes: No results for input(s): CKTOTAL, CKMB, CKMBINDEX, TROPONINI in the last 168 hours. BNP (last 3 results) No results for input(s): PROBNP in the last 8760 hours. HbA1C: No results for input(s): HGBA1C in the last 72 hours. CBG: No results for input(s): GLUCAP in the last 168 hours. Lipid Profile: No results for input(s): CHOL, HDL, LDLCALC, TRIG, CHOLHDL, LDLDIRECT in the last 72 hours. Thyroid Function Tests: Recent Labs    12/18/19 1015  TSH 1.721  FREET4 1.33*   Anemia Panel: Recent Labs    12/18/19 1015  VITAMINB12 146*   Urine analysis:    Component Value Date/Time   COLORURINE YELLOW 12/18/2019 0752   APPEARANCEUR CLEAR 12/18/2019 0752   LABSPEC 1.021 12/18/2019 0752   PHURINE 5.0 12/18/2019 0752   GLUCOSEU NEGATIVE 12/18/2019 0752   HGBUR NEGATIVE 12/18/2019 0752   BILIRUBINUR NEGATIVE 12/18/2019 0752   KETONESUR 5 (A) 12/18/2019 0752   PROTEINUR NEGATIVE 12/18/2019 0752   NITRITE NEGATIVE 12/18/2019 0752   LEUKOCYTESUR NEGATIVE 12/18/2019 0752    Intake/Output Summary (Last 24 hours) at 12/18/2019 1201 Last data filed at 12/18/2019 4128 Gross per 24 hour  Intake 2350 ml  Output --  Net 2350 ml   Lab Results  Component Value Date   CREATININE 0.70 12/18/2019   CREATININE 0.91 12/17/2019   CREATININE 1.00 12/15/2019    COVID-19 Labs  No results for input(s): DDIMER, FERRITIN, LDH, CRP in the last 72 hours.  Lab Results  Component Value Date   [place] NEGATIVE 12/17/2019   [place] NOT DETECTED 11/21/2018    Radiological Exams on Admission: DG Chest Port 1 View  Result Date: 12/17/2019 CLINICAL DATA:  Recently diagnosed with pneumonia, increasing lethargy EXAM: PORTABLE CHEST 1 VIEW COMPARISON:  Radiograph 12/15/2019 CT 04/20/2019 FINDINGS: Low lung volumes with streaky opacities in the lung bases which likely reflect atelectasis though early atypical infection or edema could have a similar appearance. No focal  consolidation. Blunting of the left costophrenic sulcus could reflect development of a trace left effusion. No right effusion or pneumothorax. Pacer pack overlies the left chest wall with leads in stable position at the right atrium and cardiac apex. The aorta is calcified. The remaining cardiomediastinal contours are unremarkable. No acute osseous or soft tissue abnormality. Prior cervical fusion, incompletely assessed on this exam. Telemetry leads overlie the chest. IMPRESSION: Low lung volumes with streaky opacities in the lung bases which likely reflect atelectasis though early atypical infection or edema could have a similar appearance. New blunting of left costophrenic sulcus may reflect trace effusion. Aortic Atherosclerosis (ICD10-I70.0). Electronically Signed   By: Lovena Le M.D.   On: 12/17/2019 21:27    EKG: Independently reviewed.  Sinus rhythm at 95, LVH, short PR interval no delta waves.  Assessment/Plan Patient is a 64 year old Caucasian female seen in the hospital today and admitted for another fall, generalized weakness,and left sided  Chest pain.  Chest pain: -Admit to med tele/ pcu. -Cycle troponin and obtain echo/ cardiology consult if indicated . -PRN ntg/ Morphine.    Anemia: -Patient has a hemoglobin drop of current hemoglobin 11.5 previous hemoglobin 2 days ago was 14.9 and 16.0 -Stool guaiac, IV PPI, type and screen , and to use if hemoglobin is 8 or below., anemia panel, thyroid panel, B12 folate. -I suspect patient's anemia may be what is contributing to patient's  syncopal episodes.  B12 deficiency:  -Suspect patient has a combination of B12 deficiency and iron deficiency anemia -B12 level is low we will start patient on cyanocobalamin IM supplementation daily for the next 3 days.  Tobacco abuse: -Nicotine patch. -Consulting when patient is stable.  CAP: -Pt already started on abx and we will cont for total 3 days.   Abnormal lft: -will f/u with cmp and  liver usg if needed.   DVT prophylaxis:  Lovenox Code Status:  Full code  Family Communication:  Daughter [phone]  Disposition Plan:  Home versus short-term rehab.  Consults called:  Physical therapy Case management Cardiology per primary team MD.  Admission status: Patient.  Status is: Inpatient  Remains inpatient appropriate because:Inpatient level of care appropriate due to severity of illness   Dispo: The patient is from: Home              Anticipated d/c is to: Home              Anticipated d/c date is: 3 days              Patient currently is not medically stable to d/c.  Para Skeans MD [facility] Pager [phone] If 7PM-7AM, please contact night-coverage [email or web address] Password [email or web address] 12/18/2019, 12:01 PM

## 2019-12-18 NOTE — ED Notes (Signed)
220 mL found during bladder scan

## 2019-12-18 NOTE — Telephone Encounter (Signed)
[name] pacemaker function

## 2019-12-18 NOTE — Progress Notes (Signed)
  Echocardiogram 2D Echocardiogram has been performed.  [name] 12/18/2019, 3:30 PM

## 2019-12-18 NOTE — ED Notes (Signed)
Attempted report to 3 [place]

## 2019-12-18 NOTE — ED Notes (Signed)
Pt attempted to use BSC for urine sample collection, pt had watery bowel movement with urination. Will attempt again soon.

## 2019-12-19 DIAGNOSIS — G9341 Metabolic encephalopathy: Secondary | ICD-10-CM

## 2019-12-19 DIAGNOSIS — E538 Deficiency of other specified B group vitamins: Secondary | ICD-10-CM | POA: Diagnosis not present

## 2019-12-19 DIAGNOSIS — Z79899 Other long term (current) drug therapy: Secondary | ICD-10-CM

## 2019-12-19 DIAGNOSIS — J189 Pneumonia, unspecified organism: Secondary | ICD-10-CM | POA: Diagnosis not present

## 2019-12-19 DIAGNOSIS — Z720 Tobacco use: Secondary | ICD-10-CM | POA: Diagnosis not present

## 2019-12-19 LAB — COMPREHENSIVE METABOLIC PANEL
ALT: 29 U/L (ref 0–44)
AST: 33 U/L (ref 15–41)
Albumin: 2.6 g/dL — ABNORMAL LOW (ref 3.5–5.0)
Alkaline Phosphatase: 39 U/L (ref 38–126)
Anion gap: 9 (ref 5–15)
BUN: <5 mg/dL — ABNORMAL LOW (ref 8–23)
CO2: 22 mmol/L (ref 22–32)
Calcium: 8.6 mg/dL — ABNORMAL LOW (ref 8.9–10.3)
Chloride: 108 mmol/L (ref 98–111)
Creatinine, Ser: 0.76 mg/dL (ref 0.44–1.00)
GFR calc Af Amer: >60 mL/min (ref >60)
GFR calc non Af Amer: >60 mL/min (ref >60)
Glucose, Bld: 90 mg/dL (ref 70–99)
Potassium: 3 mmol/L — ABNORMAL LOW (ref 3.5–5.1)
Sodium: 139 mmol/L (ref 135–145)
Total Bilirubin: 0.9 mg/dL (ref 0.3–1.2)
Total Protein: 5 g/dL — ABNORMAL LOW (ref 6.5–8.1)

## 2019-12-19 LAB — OCCULT BLOOD X 1 CARD TO LAB, STOOL: Fecal Occult Bld: NEGATIVE

## 2019-12-19 LAB — TROPONIN I (HIGH SENSITIVITY): Troponin I (High Sensitivity): 9 ng/L (ref <18)

## 2019-12-19 LAB — CK: Total CK: 836 U/L — ABNORMAL HIGH (ref 38–234)

## 2019-12-19 LAB — MAGNESIUM: Magnesium: 1.7 mg/dL (ref 1.7–2.4)

## 2019-12-19 LAB — PHOSPHORUS: Phosphorus: 3.2 mg/dL (ref 2.5–4.6)

## 2019-12-19 MED ORDER — TRAZODONE HCL 50 MG PO TABS
50.0000 mg | ORAL_TABLET | Freq: Once | ORAL | Status: AC
  Administered 2019-12-19: 50 mg via ORAL
  Filled 2019-12-19: qty 1

## 2019-12-19 MED ORDER — GUAIFENESIN-DM 100-10 MG/5ML PO SYRP
5.0000 mL | ORAL_SOLUTION | ORAL | Status: DC | PRN
  Administered 2019-12-19 – 2019-12-20 (×3): 5 mL via ORAL
  Filled 2019-12-19 (×3): qty 5

## 2019-12-19 MED ORDER — ALPRAZOLAM 0.5 MG PO TABS
1.0000 mg | ORAL_TABLET | Freq: Three times a day (TID) | ORAL | Status: DC | PRN
  Administered 2019-12-19: 1 mg via ORAL
  Filled 2019-12-19: qty 2

## 2019-12-19 NOTE — Plan of Care (Signed)
  Problem: Nutrition: Goal: Adequate nutrition will be maintained Outcome: Completed/Met   Problem: Pain Managment: Goal: General experience of comfort will improve Outcome: Completed/Met

## 2019-12-19 NOTE — Progress Notes (Addendum)
PROGRESS NOTE    [name]   XBW:[id number]  DOB: 01-11-1956  DOA: 12/17/2019 PCP: Prince Solian, MD   Brief Narrative:  [name] is a 64 y/o with a pacemaker for prolonged sinus pauses, Bipolar disorder who presents after a fall in the bathroom while tidying up.   She came to the ED on 9/28 when she was found by her family sitting by the bed on the floor. She had fallen 3 x that day. Family states she has been confused for a couple of weeks.  On 9/28 she was suspected to have a pneumonia (WBC count, tachycadia and mildly elevated lactic acid and streaky opacities at lung bases) and was given Ceftriaxone and 1 L NS in the ED. She was advised to be admitted but adamantly demanded to go home. She was prescribed Azithromycin and Keflex and sent home.   Of note, she had missed an appointment on 9/27 with psychiatry. She stated that he daughter called the police to check on her early that morning because the patient did not pick up her phone. The police took her meds and gave them to the daughter.   In ED >  Hb 11.5 dropping from 14 on 9/28.  WBC normal Lactic acid normal CK 836 UA neg CTA head and neck normal CXR> Low lung volumes with streaky opacities in the lung bases which likely reflect atelectasis though early atypical infection or edema could have a similar appearance. New blunting of left costophrenic sulcus may reflect trace effusion.  Started on Azithromycin and Ceftriaxone, given 2 L NS and admitted. All psych and pain meds held.   Subjective: She has no complaints. Cough is improving.     Assessment & Plan:   Principal Problem:   CAP (community acquired pneumonia) - has had a severe cough- WBC 17 on 9/12- improving - cont Ceftriaxone and Azithromycin - resp virus panel, influenza, covid negative  Active Problems: Polypharmacy - the patient cannot remember what she takes based on the list that I have tried to review with her - unable to resume home  meds until we accurately know her regimen - asking pharmacy to go over meds with patient and daughter who is in the room now   B12 deficiency - B12 146 on 12/18/19 - cont IM B12 injections    Acute encephalopathy and Falls - likely polypharmacy, b12 deficiency and likely also pneumonia- PT eval ordered - does not appear confused today but is not able to remember any of her meds    Hypokalemia - replace    Tobacco abuse - cont nicotine patch- she smokes about 1 ppd    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    Abnormal liver function test Hepatic Function Latest Ref Rng & Units 12/19/2019 12/17/2019 12/15/2019  Total Protein 6.5 - 8.1 g/dL 5.0(L) 7.1 7.3  Albumin 3.5 - 5.0 g/dL 2.6(L) 4.0 4.0  AST 15 - 41 U/L 33 92(H) 152(H)  ALT 0 - 44 U/L 29 53(H) 43  Alk Phosphatase 38 - 126 U/L 39 55 51  Total Bilirubin 0.3 - 1.2 mg/dL 0.9 0.9 1.9(H)   - improving     Time spent in minutes: 35 DVT prophylaxis: lovenox Code Status: Full code Family Communication:  Disposition Plan:  Status is: Inpatient  Remains inpatient appropriate because:treating pneumonia    Dispo: The patient is from: Home              Anticipated d/c is to: tbd  Anticipated d/c date is: 2 days              Patient currently is not medically stable to d/c.      Consultants:   none Procedures:   none Antimicrobials:  Anti-infectives (From admission, onward)   Start     Dose/Rate Route Frequency Ordered Stop   12/19/19 0600  azithromycin (ZITHROMAX) 500 mg in sodium chloride 0.9 % 250 mL IVPB        500 mg 250 mL/hr over 60 Minutes Intravenous Every 24 hours 12/18/19 1437     12/19/19 0600  cefTRIAXone (ROCEPHIN) 1 g in sodium chloride 0.9 % 100 mL IVPB        1 g 200 mL/hr over 30 Minutes Intravenous Every 24 hours 12/18/19 1437 12/24/19 0559   12/18/19 0530  cefTRIAXone (ROCEPHIN) 1 g in sodium chloride 0.9 % 100 mL IVPB        1 g 200 mL/hr over 30 Minutes  Intravenous  Once 12/18/19 0519 12/18/19 0627   12/18/19 0530  azithromycin (ZITHROMAX) 500 mg in sodium chloride 0.9 % 250 mL IVPB        500 mg 250 mL/hr over 60 Minutes Intravenous  Once 12/18/19 0519 12/18/19 0838       Objective: Vitals:   12/18/19 1942 12/19/19 0111 12/19/19 0349 12/19/19 1255  BP: 135/75 134/77 138/89 140/84  Pulse: 83 77 86 91  Resp: '18 17 17   ' Temp: 98.5 F (36.9 C) 98.9 F (37.2 C) 98.3 F (36.8 C) 98.6 F (37 C)  TempSrc: Oral Oral Oral Oral  SpO2: 94% 93% 92%   Weight:  61.2 kg    Height:        Intake/Output Summary (Last 24 hours) at 12/19/2019 1524 Last data filed at 12/19/2019 8182 Gross per 24 hour  Intake --  Output 200 ml  Net -200 ml   Filed Weights   12/17/19 2114 12/18/19 1220 12/19/19 0111  Weight: 61.2 kg 62.2 kg 61.2 kg    Examination: General exam: Appears comfortable  HEENT: PERRLA, oral mucosa moist, no sclera icterus or thrush Respiratory system: Clear to auscultation. Respiratory effort normal. Cardiovascular system: S1 & S2 heard, RRR.   Gastrointestinal system: Abdomen soft, non-tender, nondistended. Normal bowel sounds. Central nervous system: Alert and oriented. No focal neurological deficits. Extremities: No cyanosis, clubbing or edema Skin: No rashes or ulcers Psychiatry:  Mood & affect appropriate.     Data Reviewed: I have personally reviewed following labs and imaging studies  CBC: Recent Labs  Lab 12/15/19 2027 12/15/19 2035 12/17/19 2114 12/18/19 1015  WBC 17.9*  --  10.4 8.8  NEUTROABS 15.5*  --  6.6  --   HGB 15.2* 16.0* 14.9 11.5*  HCT 46.4* 47.0* 46.4* 36.8  MCV 87.7  --  89.9 90.4  PLT 300  --  362 993   Basic Metabolic Panel: Recent Labs  Lab 12/15/19 2027 12/15/19 2035 12/17/19 2114 12/18/19 1015 12/19/19 0336  NA 136 136 141  --  139  K 4.2 4.1 3.3*  --  3.0*  CL 103 104 107  --  108  CO2 18*  --  22  --  22  GLUCOSE 97 91 118*  --  90  BUN '13 11 8  ' --  <5*  CREATININE 1.12*  1.00 0.91 0.70 0.76  CALCIUM 9.3  --  9.8  --  8.6*  MG  --   --   --   --  1.7  PHOS  --   --   --   --  3.2   GFR: Estimated Creatinine Clearance: 70.4 mL/min (by C-G formula based on SCr of 0.76 mg/dL). Liver Function Tests: Recent Labs  Lab 12/15/19 2027 12/17/19 2114 12/19/19 0336  AST 152* 92* 33  ALT 43 53* 29  ALKPHOS 51 55 39  BILITOT 1.9* 0.9 0.9  PROT 7.3 7.1 5.0*  ALBUMIN 4.0 4.0 2.6*   No results for input(s): LIPASE, AMYLASE in the last 168 hours. Recent Labs  Lab 12/18/19 0250  AMMONIA 17   Coagulation Profile: No results for input(s): INR, PROTIME in the last 168 hours. Cardiac Enzymes: Recent Labs  Lab 12/19/19 0336  CKTOTAL 836*   BNP (last 3 results) No results for input(s): PROBNP in the last 8760 hours. HbA1C: No results for input(s): HGBA1C in the last 72 hours. CBG: No results for input(s): GLUCAP in the last 168 hours. Lipid Profile: No results for input(s): CHOL, HDL, LDLCALC, TRIG, CHOLHDL, LDLDIRECT in the last 72 hours. Thyroid Function Tests: Recent Labs    12/18/19 1015  TSH 1.721  FREET4 1.33*   Anemia Panel: Recent Labs    12/18/19 1015 12/18/19 1450  VITAMINB12 146*  --   FOLATE  --  6.4  FERRITIN  --  110  TIBC  --  221*  IRON  --  33  RETICCTPCT  --  1.0   Urine analysis:    Component Value Date/Time   COLORURINE YELLOW 12/18/2019 [place] 12/18/2019 0752   LABSPEC 1.021 12/18/2019 0752   PHURINE 5.0 12/18/2019 0752   GLUCOSEU NEGATIVE 12/18/2019 0752   HGBUR NEGATIVE 12/18/2019 0752   BILIRUBINUR NEGATIVE 12/18/2019 0752   KETONESUR 5 (A) 12/18/2019 0752   PROTEINUR NEGATIVE 12/18/2019 0752   NITRITE NEGATIVE 12/18/2019 0752   LEUKOCYTESUR NEGATIVE 12/18/2019 0752   Sepsis Labs: '@LABRCNTIP' (procalcitonin:4,lacticidven:4) ) Recent Results (from the past 240 hour(s))  Culture, blood (routine x 2)     Status: None (Preliminary result)   Collection Time: 12/17/19  9:14 PM   Specimen:  BLOOD  Result Value Ref Range Status   Specimen Description BLOOD RIGHT ANTECUBITAL  Final   Special Requests   Final    BOTTLES DRAWN AEROBIC AND ANAEROBIC Blood Culture adequate volume   Culture   Final    NO GROWTH 2 DAYS Performed at [facility], [place] [address], [place], [place] [address]    Report Status PENDING  Incomplete  Respiratory Panel by RT PCR (Flu A&B, Covid) - Nasopharyngeal Swab     Status: None   Collection Time: 12/17/19 10:11 PM   Specimen: Nasopharyngeal Swab  Result Value Ref Range Status   SARS Coronavirus 2 by RT PCR NEGATIVE NEGATIVE Final    Comment: (NOTE) SARS-CoV-2 target nucleic acids are NOT DETECTED.  The SARS-CoV-2 RNA is generally detectable in upper respiratoy specimens during the acute phase of infection. The lowest concentration of SARS-CoV-2 viral copies this assay can detect is 131 copies/mL. A negative result does not preclude SARS-Cov-2 infection and should not be used as the sole basis for treatment or other patient management decisions. A negative result may occur with  improper specimen collection/handling, submission of specimen other than nasopharyngeal swab, presence of viral mutation(s) within the areas targeted by this assay, and inadequate number of viral copies (<131 copies/mL). A negative result must be combined with clinical observations, patient history, and epidemiological information. The expected result is Negative.  Fact Sheet for  Patients:  PinkCheek.be  Fact Sheet for Healthcare Providers:  GravelBags.it  This test is no t yet approved or cleared by the [place] FDA and  has been authorized for detection and/or diagnosis of SARS-CoV-2 by FDA under an Emergency Use Authorization (EUA). This EUA will remain  in effect (meaning this test can be used) for the duration of the COVID-19 declaration under Section 564(b)(1) of the Act, 21 U.S.C. section  360bbb-3(b)(1), unless the authorization is terminated or revoked sooner.     Influenza A by PCR NEGATIVE NEGATIVE Final   Influenza B by PCR NEGATIVE NEGATIVE Final    Comment: (NOTE) The Xpert Xpress SARS-CoV-2/FLU/RSV assay is intended as an aid in  the diagnosis of influenza from Nasopharyngeal swab specimens and  should not be used as a sole basis for treatment. Nasal washings and  aspirates are unacceptable for Xpert Xpress SARS-CoV-2/FLU/RSV  testing.  Fact Sheet for Patients: PinkCheek.be  Fact Sheet for Healthcare Providers: GravelBags.it  This test is not yet approved or cleared by the [place] FDA and  has been authorized for detection and/or diagnosis of SARS-CoV-2 by  FDA under an Emergency Use Authorization (EUA). This EUA will remain  in effect (meaning this test can be used) for the duration of the  Covid-19 declaration under Section 564(b)(1) of the Act, 21  U.S.C. section 360bbb-3(b)(1), unless the authorization is  terminated or revoked. Performed at [facility], [place] [address] [place], [place] [address]   Culture, blood (routine x 2)     Status: None (Preliminary result)   Collection Time: 12/17/19 10:20 PM   Specimen: BLOOD  Result Value Ref Range Status   Specimen Description BLOOD LEFT ANTECUBITAL  Final   Special Requests   Final    BOTTLES DRAWN AEROBIC AND ANAEROBIC Blood Culture results may not be optimal due to an inadequate volume of blood received in culture bottles   Culture   Final    NO GROWTH 2 DAYS Performed at [facility], [place] [address], [place], [place] [address]    Report Status PENDING  Incomplete         Radiology Studies: DG Chest Port 1 View  Result Date: 12/17/2019 CLINICAL DATA:  Recently diagnosed with pneumonia, increasing lethargy EXAM: PORTABLE CHEST 1 VIEW COMPARISON:  Radiograph 12/15/2019 CT 04/20/2019 FINDINGS: Low lung volumes with  streaky opacities in the lung bases which likely reflect atelectasis though early atypical infection or edema could have a similar appearance. No focal consolidation. Blunting of the left costophrenic sulcus could reflect development of a trace left effusion. No right effusion or pneumothorax. Pacer pack overlies the left chest wall with leads in stable position at the right atrium and cardiac apex. The aorta is calcified. The remaining cardiomediastinal contours are unremarkable. No acute osseous or soft tissue abnormality. Prior cervical fusion, incompletely assessed on this exam. Telemetry leads overlie the chest. IMPRESSION: Low lung volumes with streaky opacities in the lung bases which likely reflect atelectasis though early atypical infection or edema could have a similar appearance. New blunting of left costophrenic sulcus may reflect trace effusion. Aortic Atherosclerosis (ICD10-I70.0). Electronically Signed   By: Lovena Le M.D.   On: 12/17/2019 21:27   ECHOCARDIOGRAM COMPLETE  Result Date: 12/18/2019    ECHOCARDIOGRAM REPORT   Patient Name:   [name] Date of Exam: 12/18/2019 Medical Rec #:  [id number]      Height:       67.0 in Accession #:  [id number]     Weight:       137.1 lb Date of Birth:  April 17, 1955      BSA:          1.723 m Patient Age:    74 years       BP:           141/91 mmHg Patient Gender: F              HR:           88 bpm. Exam Location:  Inpatient Procedure: 2D Echo, Cardiac Doppler and Color Doppler Indications:    Syncope  History:        Patient has prior history of Echocardiogram examinations, most                 recent 07/09/2017. Pacemaker, Signs/Symptoms:Syncope; Risk                 Factors:Current Smoker.  Sonographer:    Clayton Lefort RDCS (AE) Referring Phys: [place]  1. Left ventricular ejection fraction, by estimation, is 55 to 60%. The left ventricle has normal function. The left ventricle has no regional wall motion abnormalities. There is  mild left ventricular hypertrophy. Left ventricular diastolic parameters are consistent with Grade I diastolic dysfunction (impaired relaxation).  2. Right ventricular systolic function is normal. The right ventricular size is normal. Tricuspid regurgitation signal is inadequate for assessing PA pressure.  3. The mitral valve is normal in structure. Trivial mitral valve regurgitation. No evidence of mitral stenosis.  4. The aortic valve is tricuspid. Aortic valve regurgitation is not visualized. Mild aortic valve sclerosis is present, with no evidence of aortic valve stenosis.  5. The inferior vena cava is normal in size with greater than 50% respiratory variability, suggesting right atrial pressure of 3 mmHg. FINDINGS  Left Ventricle: Left ventricular ejection fraction, by estimation, is 55 to 60%. The left ventricle has normal function. The left ventricle has no regional wall motion abnormalities. The left ventricular internal cavity size was normal in size. There is  mild left ventricular hypertrophy. Left ventricular diastolic parameters are consistent with Grade I diastolic dysfunction (impaired relaxation). Right Ventricle: The right ventricular size is normal. Right ventricular systolic function is normal. Tricuspid regurgitation signal is inadequate for assessing PA pressure. The tricuspid regurgitant velocity is 1.80 m/s, and with an assumed right atrial  pressure of 3 mmHg, the estimated right ventricular systolic pressure is 93.2 mmHg. Left Atrium: Left atrial size was normal in size. Right Atrium: Right atrial size was normal in size. Pericardium: There is no evidence of pericardial effusion. Mitral Valve: The mitral valve is normal in structure. Trivial mitral valve regurgitation. No evidence of mitral valve stenosis. MV peak gradient, 3.6 mmHg. The mean mitral valve gradient is 1.0 mmHg. Tricuspid Valve: The tricuspid valve is normal in structure. Tricuspid valve regurgitation is trivial. No evidence  of tricuspid stenosis. Aortic Valve: The aortic valve is tricuspid. Aortic valve regurgitation is not visualized. Mild aortic valve sclerosis is present, with no evidence of aortic valve stenosis. Aortic valve mean gradient measures 4.0 mmHg. Aortic valve peak gradient measures 6.6 mmHg. Aortic valve area, by VTI measures 2.31 cm. Pulmonic Valve: The pulmonic valve was not well visualized. Pulmonic valve regurgitation is not visualized. No evidence of pulmonic stenosis. Aorta: The aortic root is normal in size and structure. Venous: The inferior vena cava is normal in size with greater than 50% respiratory variability, suggesting right atrial  pressure of 3 mmHg. IAS/Shunts: No atrial level shunt detected by color flow Doppler. Additional Comments: A pacer wire is visualized.  LEFT VENTRICLE PLAX 2D LVIDd:         3.70 cm  Diastology LVIDs:         2.50 cm  LV e' medial:    9.03 cm/s LV PW:         1.20 cm  LV E/e' medial:  8.9 LV IVS:        1.20 cm  LV e' lateral:   7.62 cm/s LVOT diam:     1.90 cm  LV E/e' lateral: 10.5 LV SV:         59 LV SV Index:   34 LVOT Area:     2.84 cm  RIGHT VENTRICLE             IVC RV Basal diam:  2.90 cm     IVC diam: 2.20 cm RV S prime:     14.00 cm/s TAPSE (M-mode): 2.1 cm LEFT ATRIUM             Index       RIGHT ATRIUM           Index LA diam:        2.50 cm 1.45 cm/m  RA Area:     11.40 cm LA Vol (A2C):   47.2 ml 27.40 ml/m RA Volume:   22.90 ml  13.29 ml/m LA Vol (A4C):   20.8 ml 12.08 ml/m LA Biplane Vol: 32.5 ml 18.87 ml/m  AORTIC VALVE AV Area (Vmax):    2.26 cm AV Area (Vmean):   1.93 cm AV Area (VTI):     2.31 cm AV Vmax:           128.00 cm/s AV Vmean:          96.500 cm/s AV VTI:            0.257 m AV Peak Grad:      6.6 mmHg AV Mean Grad:      4.0 mmHg LVOT Vmax:         102.00 cm/s LVOT Vmean:        65.600 cm/s LVOT VTI:          0.209 m LVOT/AV VTI ratio: 0.81  AORTA Ao Root diam: 3.10 cm Ao Asc diam:  3.00 cm MITRAL VALVE               TRICUSPID VALVE MV  Area (PHT): 4.06 cm    TR Peak grad:   13.0 mmHg MV Peak grad:  3.6 mmHg    TR Vmax:        180.00 cm/s MV Mean grad:  1.0 mmHg MV Vmax:       0.96 m/s    SHUNTS MV Vmean:      55.3 cm/s   Systemic VTI:  0.21 m MV Decel Time: 187 msec    Systemic Diam: 1.90 cm MV E velocity: 80.10 cm/s MV A velocity: 84.80 cm/s MV E/A ratio:  0.94 Kirk Ruths MD Electronically signed by Kirk Ruths MD Signature Date/Time: 12/18/2019/3:33:36 PM    Final       Scheduled Meds: . cyanocobalamin  1,000 mcg Intramuscular Daily  . enoxaparin (LOVENOX) injection  40 mg Subcutaneous Q24H  . nicotine  21 mg Transdermal Daily  . pantoprazole (PROTONIX) IV  40 mg Intravenous Q24H  . sodium chloride flush  3 mL Intravenous Q12H   Continuous Infusions: .  azithromycin 500 mg (12/19/19 0618)  . cefTRIAXone (ROCEPHIN)  IV 1 g (12/19/19 0615)     LOS: 1 day      Debbe Odea, MD [facility] Pager: [email or web address] 12/19/2019, 3:24 PM

## 2019-12-20 DIAGNOSIS — E538 Deficiency of other specified B group vitamins: Secondary | ICD-10-CM | POA: Diagnosis not present

## 2019-12-20 DIAGNOSIS — Z79899 Other long term (current) drug therapy: Secondary | ICD-10-CM

## 2019-12-20 DIAGNOSIS — G9340 Encephalopathy, unspecified: Secondary | ICD-10-CM

## 2019-12-20 DIAGNOSIS — W19XXXD Unspecified fall, subsequent encounter: Secondary | ICD-10-CM | POA: Diagnosis not present

## 2019-12-20 DIAGNOSIS — J189 Pneumonia, unspecified organism: Secondary | ICD-10-CM | POA: Diagnosis not present

## 2019-12-20 DIAGNOSIS — E876 Hypokalemia: Secondary | ICD-10-CM

## 2019-12-20 LAB — BASIC METABOLIC PANEL
Anion gap: 9 (ref 5–15)
BUN: <5 mg/dL — ABNORMAL LOW (ref 8–23)
CO2: 23 mmol/L (ref 22–32)
Calcium: 8.8 mg/dL — ABNORMAL LOW (ref 8.9–10.3)
Chloride: 107 mmol/L (ref 98–111)
Creatinine, Ser: 0.74 mg/dL (ref 0.44–1.00)
GFR calc Af Amer: >60 mL/min (ref >60)
GFR calc non Af Amer: >60 mL/min (ref >60)
Glucose, Bld: 95 mg/dL (ref 70–99)
Potassium: 3.3 mmol/L — ABNORMAL LOW (ref 3.5–5.1)
Sodium: 139 mmol/L (ref 135–145)

## 2019-12-20 MED ORDER — RISPERIDONE 1 MG PO TABS
3.0000 mg | ORAL_TABLET | Freq: Every day | ORAL | Status: DC
  Administered 2019-12-20: 3 mg via ORAL
  Filled 2019-12-20: qty 3

## 2019-12-20 MED ORDER — POTASSIUM CHLORIDE CRYS ER 20 MEQ PO TBCR
40.0000 meq | EXTENDED_RELEASE_TABLET | Freq: Once | ORAL | Status: AC
  Administered 2019-12-20: 40 meq via ORAL
  Filled 2019-12-20: qty 2

## 2019-12-20 MED ORDER — VITAMIN B-12 1000 MCG PO TABS
1000.0000 ug | ORAL_TABLET | Freq: Every day | ORAL | Status: DC
  Administered 2019-12-20 – 2019-12-21 (×2): 1000 ug via ORAL
  Filled 2019-12-20 (×2): qty 1

## 2019-12-20 MED ORDER — PANTOPRAZOLE SODIUM 40 MG PO TBEC
40.0000 mg | DELAYED_RELEASE_TABLET | Freq: Every day | ORAL | Status: DC
  Administered 2019-12-21: 40 mg via ORAL
  Filled 2019-12-20: qty 1

## 2019-12-20 MED ORDER — TRAZODONE HCL 50 MG PO TABS
50.0000 mg | ORAL_TABLET | Freq: Every evening | ORAL | Status: DC | PRN
  Administered 2019-12-20: 50 mg via ORAL
  Filled 2019-12-20: qty 1

## 2019-12-20 MED ORDER — SERTRALINE HCL 100 MG PO TABS
200.0000 mg | ORAL_TABLET | Freq: Every day | ORAL | Status: DC
  Administered 2019-12-20: 200 mg via ORAL
  Filled 2019-12-20: qty 2

## 2019-12-20 MED ORDER — BUSPIRONE HCL 10 MG PO TABS
10.0000 mg | ORAL_TABLET | Freq: Three times a day (TID) | ORAL | Status: DC
  Administered 2019-12-20 – 2019-12-21 (×3): 10 mg via ORAL
  Filled 2019-12-20 (×3): qty 1

## 2019-12-20 NOTE — Progress Notes (Signed)
PROGRESS NOTE    HAGEN TIDD   GYJ:[id number]  DOB: 09-30-1955  DOA: 12/17/2019 PCP: Prince Solian, MD   Brief Narrative:  MARITTA KIEF is a 64 y/o with a pacemaker for prolonged sinus pauses, Bipolar disorder who presents after a fall in the bathroom while tidying up.   She came to the ED on 9/28 when she was found by her family sitting by the bed on the floor. She had fallen 3 x that day. Family states she has been confused for a couple of weeks.  On 9/28 she was suspected to have a pneumonia (WBC count, tachycadia and mildly elevated lactic acid and streaky opacities at lung bases) and was given Ceftriaxone and 1 L NS in the ED. She was advised to be admitted but adamantly demanded to go home. She was prescribed Azithromycin and Keflex and sent home. She took 1 dose of each.  Of note, she had missed an appointment on 9/27 with psychiatry. She stated that he daughter called the police to check on her early that morning because the patient did not pick up her phone. The police took her meds and gave them to the daughter.   In ED >  Hb 11.5 dropping from 14 on 9/28.  WBC normal Lactic acid normal CK 836 UA neg CTA head and neck normal CXR> Low lung volumes with streaky opacities in the lung bases which likely reflect atelectasis though early atypical infection or edema could have a similar appearance. New blunting of left costophrenic sulcus may reflect trace effusion.  Started on Azithromycin and Ceftriaxone, given 2 L NS and admitted. All psych and pain meds held.   Subjective: Continues to cough. Per RN, she remains very unstable on her feet. She is weak.     Assessment & Plan:   Principal Problem:   CAP (community acquired pneumonia) Lactic acidosis- improved - has had a severe cough- WBC 17 on 9/12- improving - cont Ceftriaxone and Azithromycin - resp virus panel, influenza, covid negative  Active Problems: Polypharmacy - the patient cannot remember what she  takes based on the list that I have tried to review with her - unable to resume home meds until we accurately know her regimen - meds reveiwed with patient, daughter and pharmacy- will start resume Buspar 10 TID, Zoloft 100 BID and Risperdal 30 QHS- follow her mental status on these in hospital   B12 deficiency - B12 146 on 12/18/19 -   IM B12 injections given x 3 - start oral B12 now    Acute encephalopathy and Falls - likely polypharmacy, b12 deficiency and likely also pneumonia - PT eval ordered  Mildly abnormal free T4 - 1.33- recheck in a few weeks  Anemia - ? 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    Abnormal liver function test Hepatic Function Latest Ref Rng & Units 12/19/2019 12/17/2019 12/15/2019  Total Protein 6.5 - 8.1 g/dL 5.0(L) 7.1 7.3  Albumin 3.5 - 5.0 g/dL 2.6(L) 4.0 4.0  AST 15 -  41 U/L 33 92(H) 152(H)  ALT 0 - 44 U/L 29 53(H) 43  Alk Phosphatase 38 - 126 U/L 39 55 51  Total Bilirubin 0.3 - 1.2 mg/dL 0.9 0.9 1.9(H)   - improved     Time spent in minutes: 35 DVT prophylaxis: lovenox Code Status: Full code Family Communication:  Disposition Plan:  Status is: Inpatient  Remains inpatient appropriate because:treating pneumonia resuming psych meds and following mental status   Dispo: The patient is from: Home              Anticipated d/c is to: tbd               Anticipated d/c date is: 10/4              Patient currently is not medically stable to d/c.      Consultants:   none Procedures:   none Antimicrobials:  Anti-infectives  (From admission, onward)   Start     Dose/Rate Route Frequency Ordered Stop   12/19/19 0600  azithromycin (ZITHROMAX) 500 mg in sodium chloride 0.9 % 250 mL IVPB        500 mg 250 mL/hr over 60 Minutes Intravenous Every 24 hours 12/18/19 1437     12/19/19 0600  cefTRIAXone (ROCEPHIN) 1 g in sodium chloride 0.9 % 100 mL IVPB        1 g 200 mL/hr over 30 Minutes Intravenous Every 24 hours 12/18/19 1437 12/24/19 0559   12/18/19 0530  cefTRIAXone (ROCEPHIN) 1 g in sodium chloride 0.9 % 100 mL IVPB        1 g 200 mL/hr over 30 Minutes Intravenous  Once 12/18/19 0519 12/18/19 0627   12/18/19 0530  azithromycin (ZITHROMAX) 500 mg in sodium chloride 0.9 % 250 mL IVPB        500 mg 250 mL/hr over 60 Minutes Intravenous  Once 12/18/19 0519 12/18/19 0838       Objective: Vitals:   12/20/19 0147 12/20/19 0426 12/20/19 0429 12/20/19 1134  BP: (!) 144/83  (!) 148/89 137/87  Pulse: 89  90 84  Resp:   18 18  Temp:   98.7 F (37.1 C) 98.9 F (37.2 C)  TempSrc:   Oral Oral  SpO2:   95% 93%  Weight:  59.6 kg    Height:        Intake/Output Summary (Last 24 hours) at 12/20/2019 1345 Last data filed at 12/20/2019 1247 Gross per 24 hour  Intake 550 ml  Output 200 ml  Net 350 ml   Filed Weights   12/18/19 1220 12/19/19 0111 12/20/19 0426  Weight: 62.2 kg 61.2 kg 59.6 kg    Examination: General exam: Appears comfortable  HEENT: PERRLA, oral mucosa moist, no sclera icterus or thrush Respiratory system: Clear to auscultation. Respiratory effort normal. Cardiovascular system: S1 & S2 heard,  No murmurs  Gastrointestinal system: Abdomen soft, non-tender, nondistended. Normal bowel sounds  Central nervous system: Alert and oriented. No focal neurological deficits. Extremities: No cyanosis, clubbing or edema Skin: No rashes or ulcers Psychiatry:  Mood & affect appropriate.    Data Reviewed: I have personally reviewed following labs and imaging studies  CBC: Recent Labs  Lab  12/15/19 2027 12/15/19 2035 12/17/19 2114 12/18/19 1015  WBC 17.9*  --  10.4 8.8  NEUTROABS 15.5*  --  6.6  --   HGB 15.2* 16.0* 14.9 11.5*  HCT 46.4* 47.0* 46.4* 36.8  MCV 87.7  --  89.9 90.4  PLT 300  --  362 161   Basic Metabolic Panel: Recent Labs  Lab 12/15/19 2027 12/15/19 2027 12/15/19 2035 12/17/19 2114 12/18/19 1015 12/19/19 0336 12/20/19 0406  NA 136  --  136 141  --  139 139  K 4.2  --  4.1 3.3*  --  3.0* 3.3*  CL 103  --  104 107  --  108 107  CO2 18*  --   --  22  --  22 23  GLUCOSE 97  --  91 118*  --  90 95  BUN 13  --  11 8  --  <5* <5*  CREATININE 1.12*   < > 1.00 0.91 0.70 0.76 0.74  CALCIUM 9.3  --   --  9.8  --  8.6* 8.8*  MG  --   --   --   --   --  1.7  --   PHOS  --   --   --   --   --  3.2  --    < > = values in this interval not displayed.   GFR: Estimated Creatinine Clearance: 68.6 mL/min (by C-G formula based on SCr of 0.74 mg/dL). Liver Function Tests: Recent Labs  Lab 12/15/19 2027 12/17/19 2114 12/19/19 0336  AST 152* 92* 33  ALT 43 53* 29  ALKPHOS 51 55 39  BILITOT 1.9* 0.9 0.9  PROT 7.3 7.1 5.0*  ALBUMIN 4.0 4.0 2.6*   No results for input(s): LIPASE, AMYLASE in the last 168 hours. Recent Labs  Lab 12/18/19 0250  AMMONIA 17   Coagulation Profile: No results for input(s): INR, PROTIME in the last 168 hours. Cardiac Enzymes: Recent Labs  Lab 12/19/19 0336  CKTOTAL 836*   BNP (last 3 results) No results for input(s): PROBNP in the last 8760 hours. HbA1C: No results for input(s): HGBA1C in the last 72 hours. CBG: No results for input(s): GLUCAP in the last 168 hours. Lipid Profile: No results for input(s): CHOL, HDL, LDLCALC, TRIG, CHOLHDL, LDLDIRECT in the last 72 hours. Thyroid Function Tests: Recent Labs    12/18/19 1015  TSH 1.721  FREET4 1.33*   Anemia Panel: Recent Labs    12/18/19 1015 12/18/19 1450  VITAMINB12 146*  --   FOLATE  --  6.4  FERRITIN  --  110  TIBC  --  221*  IRON  --  33   RETICCTPCT  --  1.0   Urine analysis:    Component Value Date/Time   COLORURINE YELLOW 12/18/2019 [place] 12/18/2019 0752   LABSPEC 1.021 12/18/2019 0752   PHURINE 5.0 12/18/2019 0752   GLUCOSEU NEGATIVE 12/18/2019 0752   HGBUR NEGATIVE 12/18/2019 0752   BILIRUBINUR NEGATIVE 12/18/2019 0752   KETONESUR 5 (A) 12/18/2019 0752   PROTEINUR NEGATIVE 12/18/2019 0752   NITRITE NEGATIVE 12/18/2019 0752   LEUKOCYTESUR NEGATIVE 12/18/2019 0752   Sepsis Labs: _0 (procalcitonin:4,lacticidven:4) ) Recent Results (from the past 240 hour(s))  Culture, blood (routine x 2)     Status: None (Preliminary result)   Collection Time: 12/17/19  9:14 PM   Specimen: BLOOD  Result Value Ref Range Status   Specimen Description BLOOD RIGHT ANTECUBITAL  Final   Special Requests   Final    BOTTLES DRAWN AEROBIC AND ANAEROBIC Blood Culture adequate volume   Culture   Final    NO GROWTH 3 DAYS Performed at [facility], [place] [address], [place], [place] [address]    Report Status PENDING  Incomplete  Respiratory  Panel by RT PCR (Flu A&B, Covid) - Nasopharyngeal Swab     Status: None   Collection Time: 12/17/19 10:11 PM   Specimen: Nasopharyngeal Swab  Result Value Ref Range Status   SARS Coronavirus 2 by RT PCR NEGATIVE NEGATIVE Final    Comment: (NOTE) SARS-CoV-2 target nucleic acids are NOT DETECTED.  The SARS-CoV-2 RNA is generally detectable in upper respiratoy specimens during the acute phase of infection. The lowest concentration of SARS-CoV-2 viral copies this assay can detect is 131 copies/mL. A negative result does not preclude SARS-Cov-2 infection and should not be used as the sole basis for treatment or other patient management decisions. A negative result may occur with  improper specimen collection/handling, submission of specimen other than nasopharyngeal swab, presence of viral mutation(s) within the areas targeted by this assay, and inadequate number  of viral copies (<131 copies/mL). A negative result must be combined with clinical observations, patient history, and epidemiological information. The expected result is Negative.  Fact Sheet for Patients:  PinkCheek.be  Fact Sheet for Healthcare Providers:  GravelBags.it  This test is no t yet approved or cleared by the [place] FDA and  has been authorized for detection and/or diagnosis of SARS-CoV-2 by FDA under an Emergency Use Authorization (EUA). This EUA will remain  in effect (meaning this test can be used) for the duration of the COVID-19 declaration under Section 564(b)(1) of the Act, 21 U.S.C. section 360bbb-3(b)(1), unless the authorization is terminated or revoked sooner.     Influenza A by PCR NEGATIVE NEGATIVE Final   Influenza B by PCR NEGATIVE NEGATIVE Final    Comment: (NOTE) The Xpert Xpress SARS-CoV-2/FLU/RSV assay is intended as an aid in  the diagnosis of influenza from Nasopharyngeal swab specimens and  should not be used as a sole basis for treatment. Nasal washings and  aspirates are unacceptable for Xpert Xpress SARS-CoV-2/FLU/RSV  testing.  Fact Sheet for Patients: PinkCheek.be  Fact Sheet for Healthcare Providers: GravelBags.it  This test is not yet approved or cleared by the [place] FDA and  has been authorized for detection and/or diagnosis of SARS-CoV-2 by  FDA under an Emergency Use Authorization (EUA). This EUA will remain  in effect (meaning this test can be used) for the duration of the  Covid-19 declaration under Section 564(b)(1) of the Act, 21  U.S.C. section 360bbb-3(b)(1), unless the authorization is  terminated or revoked. Performed at [facility], [place] [address] [place], [place] [address]   Culture, blood (routine x 2)     Status: None (Preliminary result)   Collection Time: 12/17/19 10:20 PM    Specimen: BLOOD  Result Value Ref Range Status   Specimen Description BLOOD LEFT ANTECUBITAL  Final   Special Requests   Final    BOTTLES DRAWN AEROBIC AND ANAEROBIC Blood Culture results may not be optimal due to an inadequate volume of blood received in culture bottles   Culture   Final    NO GROWTH 3 DAYS Performed at [facility], [place] [address], [place], [place] [address]    Report Status PENDING  Incomplete         Radiology Studies: ECHOCARDIOGRAM COMPLETE  Result Date: 12/18/2019    ECHOCARDIOGRAM REPORT   Patient Name:   MAGDELYN ROEBUCK Date of Exam: 12/18/2019 Medical Rec #:  [id number]      Height:       67.0 in Accession #:    [id number]     Weight:  137.1 lb Date of Birth:  08-09-1955      BSA:          1.723 m Patient Age:    13 years       BP:           141/91 mmHg Patient Gender: F              HR:           88 bpm. Exam Location:  Inpatient Procedure: 2D Echo, Cardiac Doppler and Color Doppler Indications:    Syncope  History:        Patient has prior history of Echocardiogram examinations, most                 recent 07/09/2017. Pacemaker, Signs/Symptoms:Syncope; Risk                 Factors:Current Smoker.  Sonographer:    Clayton Lefort RDCS (AE) Referring Phys: [place]  1. Left ventricular ejection fraction, by estimation, is 55 to 60%. The left ventricle has normal function. The left ventricle has no regional wall motion abnormalities. There is mild left ventricular hypertrophy. Left ventricular diastolic parameters are consistent with Grade I diastolic dysfunction (impaired relaxation).  2. Right ventricular systolic function is normal. The right ventricular size is normal. Tricuspid regurgitation signal is inadequate for assessing PA pressure.  3. The mitral valve is normal in structure. Trivial mitral valve regurgitation. No evidence of mitral stenosis.  4. The aortic valve is tricuspid. Aortic valve regurgitation is not visualized. Mild aortic  valve sclerosis is present, with no evidence of aortic valve stenosis.  5. The inferior vena cava is normal in size with greater than 50% respiratory variability, suggesting right atrial pressure of 3 mmHg. FINDINGS  Left Ventricle: Left ventricular ejection fraction, by estimation, is 55 to 60%. The left ventricle has normal function. The left ventricle has no regional wall motion abnormalities. The left ventricular internal cavity size was normal in size. There is  mild left ventricular hypertrophy. Left ventricular diastolic parameters are consistent with Grade I diastolic dysfunction (impaired relaxation). Right Ventricle: The right ventricular size is normal. Right ventricular systolic function is normal. Tricuspid regurgitation signal is inadequate for assessing PA pressure. The tricuspid regurgitant velocity is 1.80 m/s, and with an assumed right atrial  pressure of 3 mmHg, the estimated right ventricular systolic pressure is 74.8 mmHg. Left Atrium: Left atrial size was normal in size. Right Atrium: Right atrial size was normal in size. Pericardium: There is no evidence of pericardial effusion. Mitral Valve: The mitral valve is normal in structure. Trivial mitral valve regurgitation. No evidence of mitral valve stenosis. MV peak gradient, 3.6 mmHg. The mean mitral valve gradient is 1.0 mmHg. Tricuspid Valve: The tricuspid valve is normal in structure. Tricuspid valve regurgitation is trivial. No evidence of tricuspid stenosis. Aortic Valve: The aortic valve is tricuspid. Aortic valve regurgitation is not visualized. Mild aortic valve sclerosis is present, with no evidence of aortic valve stenosis. Aortic valve mean gradient measures 4.0 mmHg. Aortic valve peak gradient measures 6.6 mmHg. Aortic valve area, by VTI measures 2.31 cm. Pulmonic Valve: The pulmonic valve was not well visualized. Pulmonic valve regurgitation is not visualized. No evidence of pulmonic stenosis. Aorta: The aortic root is normal in  size and structure. Venous: The inferior vena cava is normal in size with greater than 50% respiratory variability, suggesting right atrial pressure of 3 mmHg. IAS/Shunts: No atrial level shunt detected by color  flow Doppler. Additional Comments: A pacer wire is visualized.  LEFT VENTRICLE PLAX 2D LVIDd:         3.70 cm  Diastology LVIDs:         2.50 cm  LV e' medial:    9.03 cm/s LV PW:         1.20 cm  LV E/e' medial:  8.9 LV IVS:        1.20 cm  LV e' lateral:   7.62 cm/s LVOT diam:     1.90 cm  LV E/e' lateral: 10.5 LV SV:         59 LV SV Index:   34 LVOT Area:     2.84 cm  RIGHT VENTRICLE             IVC RV Basal diam:  2.90 cm     IVC diam: 2.20 cm RV S prime:     14.00 cm/s TAPSE (M-mode): 2.1 cm LEFT ATRIUM             Index       RIGHT ATRIUM           Index LA diam:        2.50 cm 1.45 cm/m  RA Area:     11.40 cm LA Vol (A2C):   47.2 ml 27.40 ml/m RA Volume:   22.90 ml  13.29 ml/m LA Vol (A4C):   20.8 ml 12.08 ml/m LA Biplane Vol: 32.5 ml 18.87 ml/m  AORTIC VALVE AV Area (Vmax):    2.26 cm AV Area (Vmean):   1.93 cm AV Area (VTI):     2.31 cm AV Vmax:           128.00 cm/s AV Vmean:          96.500 cm/s AV VTI:            0.257 m AV Peak Grad:      6.6 mmHg AV Mean Grad:      4.0 mmHg LVOT Vmax:         102.00 cm/s LVOT Vmean:        65.600 cm/s LVOT VTI:          0.209 m LVOT/AV VTI ratio: 0.81  AORTA Ao Root diam: 3.10 cm Ao Asc diam:  3.00 cm MITRAL VALVE               TRICUSPID VALVE MV Area (PHT): 4.06 cm    TR Peak grad:   13.0 mmHg MV Peak grad:  3.6 mmHg    TR Vmax:        180.00 cm/s MV Mean grad:  1.0 mmHg MV Vmax:       0.96 m/s    SHUNTS MV Vmean:      55.3 cm/s   Systemic VTI:  0.21 m MV Decel Time: 187 msec    Systemic Diam: 1.90 cm MV E velocity: 80.10 cm/s MV A velocity: 84.80 cm/s MV E/A ratio:  0.94 Kirk Ruths MD Electronically signed by Kirk Ruths MD Signature Date/Time: 12/18/2019/3:33:36 PM    Final       Scheduled Meds: . enoxaparin (LOVENOX) injection  40 mg  Subcutaneous Q24H  . nicotine  21 mg Transdermal Daily  . pantoprazole (PROTONIX) IV  40 mg Intravenous Q24H  . potassium chloride  40 mEq Oral Once  . sodium chloride flush  3 mL Intravenous Q12H   Continuous Infusions: . azithromycin 500 mg (12/20/19 0659)  . cefTRIAXone (ROCEPHIN)  IV 1 g (12/20/19 0624)     LOS: 2 days      Debbe Odea, MD [facility] Pager: [email or web address] 12/20/2019, 1:45 PM

## 2019-12-20 NOTE — Care Management (Signed)
Spoke with patient's daughter, [name], regarding discharge plan.  She is out of town but states the patient will stay with her other daughter, [name] or her mother after discharge.  [name] is aware that patient will need supervision and states that [name] can help get her to her MD appointments.  [name] should be the contact person for day-to-day needs.  [name] helps with medical and financial decisions.

## 2019-12-21 ENCOUNTER — Telehealth: Payer: Medicare HMO | Admitting: Physician Assistant

## 2019-12-21 DIAGNOSIS — E538 Deficiency of other specified B group vitamins: Secondary | ICD-10-CM | POA: Diagnosis not present

## 2019-12-21 DIAGNOSIS — G9340 Encephalopathy, unspecified: Secondary | ICD-10-CM | POA: Diagnosis not present

## 2019-12-21 DIAGNOSIS — J189 Pneumonia, unspecified organism: Secondary | ICD-10-CM | POA: Diagnosis not present

## 2019-12-21 DIAGNOSIS — R945 Abnormal results of liver function studies: Secondary | ICD-10-CM | POA: Diagnosis not present

## 2019-12-21 DIAGNOSIS — Z950 Presence of cardiac pacemaker: Secondary | ICD-10-CM

## 2019-12-21 LAB — BASIC METABOLIC PANEL
Anion gap: 6 (ref 5–15)
BUN: 7 mg/dL — ABNORMAL LOW (ref 8–23)
CO2: 26 mmol/L (ref 22–32)
Calcium: 9.1 mg/dL (ref 8.9–10.3)
Chloride: 108 mmol/L (ref 98–111)
Creatinine, Ser: 0.78 mg/dL (ref 0.44–1.00)
GFR calc Af Amer: >60 mL/min (ref >60)
GFR calc non Af Amer: >60 mL/min (ref >60)
Glucose, Bld: 102 mg/dL — ABNORMAL HIGH (ref 70–99)
Potassium: 3.8 mmol/L (ref 3.5–5.1)
Sodium: 140 mmol/L (ref 135–145)

## 2019-12-21 MED ORDER — NICOTINE 21 MG/24HR TD PT24: 21.0000 mg | MEDICATED_PATCH | Freq: Every day | TRANSDERMAL | 0 refills | Status: DC

## 2019-12-21 MED ORDER — AZITHROMYCIN 500 MG PO TABS: 500.0000 mg | ORAL_TABLET | Freq: Every day | ORAL | Status: DC

## 2019-12-21 MED ORDER — CEFUROXIME AXETIL 500 MG PO TABS: 500.0000 mg | ORAL_TABLET | Freq: Two times a day (BID) | ORAL | 0 refills | Status: DC

## 2019-12-21 MED ORDER — AZITHROMYCIN 500 MG PO TABS: 500.0000 mg | ORAL_TABLET | Freq: Every day | ORAL | 0 refills | Status: DC

## 2019-12-21 MED ORDER — ALPRAZOLAM 1 MG PO TABS: 0.5000 mg | ORAL_TABLET | Freq: Three times a day (TID) | ORAL | 1 refills | Status: DC | PRN

## 2019-12-21 NOTE — Discharge Instructions (Signed)
I recommend you cut back on Xanax to 0.5 mg instead of 1 mg.  You were cared for by a hospitalist during your hospital stay. If you have any questions about your discharge medications or the care you received while you were in the hospital after you are discharged, you can call the unit and asked to speak with the hospitalist on call if the hospitalist that took care of you is not available. Once you are discharged, your primary care physician will handle any further medical issues.   Please note that NO REFILLS for any discharge medications will be authorized once you are discharged, as it is imperative that you return to your primary care physician (or establish a relationship with a primary care physician if you do not have one) for your aftercare needs so that they can reassess your need for medications and monitor your lab values.  Please take all your medications with you for your next visit with your Primary MD. Please ask your Primary MD to get all Hospital records sent to his/her office. Please request your Primary MD to go over all hospital test results at the follow up.   If you experience worsening of your admission symptoms, develop shortness of breath, chest pain, suicidal or homicidal thoughts or a life threatening emergency, you must seek medical attention immediately by calling 911 or calling your MD.   Dennis Bast must read the complete instructions/literature along with all the possible adverse reactions/side effects for all the medicines you take including new medications that have been prescribed to you. Take new medicines after you have completely understood and accpet all the possible adverse reactions/side effects.    Do not drive when taking pain medications or sedatives.     Do not take more than prescribed Pain, Sleep and Anxiety Medications   If you have smoked or chewed Tobacco in the last 2 yrs please stop. Stop any regular alcohol  and or recreational drug use.   Wear Seat  belts while driving.

## 2019-12-21 NOTE — Progress Notes (Signed)
PHARMACIST - PHYSICIAN COMMUNICATION DR:   Wynelle Cleveland CONCERNING: Antibiotic IV to Oral Route Change Policy  RECOMMENDATION: This patient is receiving azithromycin by the intravenous route.  Based on criteria approved by the Pharmacy and Therapeutics Committee, the antibiotic(s) is/are being converted to the equivalent oral dose form(s).   DESCRIPTION: These criteria include:  Patient being treated for a respiratory tract infection, urinary tract infection, cellulitis or clostridium difficile associated diarrhea if on metronidazole  The patient is not neutropenic and does not exhibit a GI malabsorption state  The patient is eating (either orally or via tube) and/or has been taking other orally administered medications for a least 24 hours  The patient is improving clinically and has a Tmax < 100.5  If you have questions about this conversion, please contact the Pharmacy Department  []   [phone] )  [facility] []   [phone] )  [facility] [x]   [phone] )  [facility] []   [phone] )  [facility] []   [phone] )  [facility]

## 2019-12-21 NOTE — Progress Notes (Signed)
D/C instructions given and reviewed, no questions asked but encouraged to call with any concerns. Tele and IV's removed, tolerated well. Attempting to call daughter for transport.

## 2019-12-21 NOTE — Plan of Care (Signed)
  Problem: Education: Goal: Knowledge of General Education information will improve Description: Including pain rating scale, medication(s)/side effects and non-pharmacologic comfort measures Outcome: Adequate for Discharge   Problem: Health Behavior/Discharge Planning: Goal: Ability to manage health-related needs will improve Outcome: Adequate for Discharge   Problem: Clinical Measurements: Goal: Ability to maintain clinical measurements within normal limits will improve Outcome: Adequate for Discharge Goal: Will remain free from infection Outcome: Adequate for Discharge Goal: Diagnostic test results will improve Outcome: Adequate for Discharge Goal: Cardiovascular complication will be avoided Outcome: Adequate for Discharge   Problem: Activity: Goal: Risk for activity intolerance will decrease Outcome: Adequate for Discharge   Problem: Safety: Goal: Ability to remain free from injury will improve Outcome: Adequate for Discharge

## 2019-12-21 NOTE — Plan of Care (Signed)
  Problem: Clinical Measurements: Goal: Respiratory complications will improve Outcome: Completed/Met   Problem: Coping: Goal: Level of anxiety will decrease Outcome: Completed/Met   Problem: Elimination: Goal: Will not experience complications related to bowel motility Outcome: Completed/Met Goal: Will not experience complications related to urinary retention Outcome: Completed/Met   Problem: Skin Integrity: Goal: Risk for impaired skin integrity will decrease Outcome: Completed/Met

## 2019-12-21 NOTE — Discharge Summary (Signed)
Physician Discharge Summary  DANAHI REDDISH RXV:[id number] DOB: 1955/04/01 DOA: 12/17/2019  PCP: Prince Solian, MD  Admit date: 12/17/2019 Discharge date: 12/21/2019  Admitted From: home alone Disposition:  Home with family supervision   Recommendations for Outpatient Follow-up:  1. Strongly recommend minimizing sedating medications 2. Continue to encourage to stop smoking  Home Health:  none Discharge Condition:  stable   CODE STATUS:  Full code   Consultations:  none  Procedures/Studies: . 2 D ECHO 12/18/19 1. Left ventricular ejection fraction, by estimation, is 55 to 60%. The left ventricle has normal function. The left ventricle has no regional wall motion abnormalities. There is mild left ventricular hypertrophy. Left ventricular diastolic parameters are consistent with Grade I diastolic dysfunction (impaired relaxation). 2. Right ventricular systolic function is normal. The right ventricular size is normal. Tricuspid regurgitation signal is inadequate for assessing PA pressure. 3. The mitral valve is normal in structure. Trivial mitral valve regurgitation. No evidence of mitral stenosis. 4. The aortic valve is tricuspid. Aortic valve regurgitation is not visualized. Mild aortic valve sclerosis is present, with no evidence of aortic valve stenosis. 5. The inferior vena cava is normal in size with greater than 50% respiratory variability, suggesting right atrial pressure of 3 mmHg.     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    She came to the ED on 9/28 when she was found by her family sitting by the bed on the floor. She had fallen 3 x that day. Family states she has been confused for a couple of weeks.  On 9/28 she was suspected to have a pneumonia (WBC count, tachycadia and mildly elevated lactic acid and streaky opacities at lung bases) and was given Ceftriaxone and 1 L NS in the ED. She was advised to be admitted but adamantly demanded to go home. She was prescribed Azithromycin and Keflex and sent home. She took 1 dose of each.  Of note, she had missed an appointment on 9/27 with psychiatry. She stated that he daughter called the police to check on her early that morning because the patient did not pick up her phone. The police took her meds and gave them to the daughter.   In ED >  Hb 11.5 dropping from 14 on 9/28.  WBC normal Lactic acid normal CK 836 UA neg CTA head and neck normal CXR> Low lung volumes with streaky opacities in the lung bases which likely reflect atelectasis though early atypical infection or edema could have a similar appearance. New blunting of left costophrenic sulcus may reflect trace effusion.  Started on Azithromycin and Ceftriaxone, given 2 L NS and admitted. All psych and pain meds held.   Hospital Course:  CAP (community acquired pneumonia) Lactic acidosis- improved - has had a severe cough  - WBC 17 on 9/28- see above symptoms- given Ceftriaxone in ED and then she left only to come back - treated this time with Ceftriaxone and Azithromycin- changing to Ceftin and oral Azithro x 5 days- tomorrow is the last day -  resp virus panel, influenza, covid negative - encouraged to stop smoking and prescription for nicotine patches given  Active Problems:    Acute encephalopathy and Falls - likely polypharmacy, b12 deficiency in setting of pneumonia - pacemaker checked and no concerns found  Polypharmacy- Bipolar disorder - the patient cannot remember what she takes  based on the list that I have tried to review with her- she also states that there are about 10 bottles of medications on her dresser and she does not take all of them - meds reveiwed with patient, daughter and pharmacy in an attempt to obtain an accurate list - 10/3> resumed Buspar 10 TID, Zoloft 100 BID and Risperdal 30 QHS- mental status is stable - a number of prescribed meds are recommended to be discontinued- see below med list- Xanax dose is mg TID PRN- I strongly recommended to reduce this to 0.5 mg TID PRN   B12 deficiency - B12 146 on 12/18/19- she states she has previously been prescribed B12 and even got it filled but then decided she did not need to take them -   IM B12 injections given x 3 days - started oral B12 in house   Mildly abnormal free T4 - 1.33- recheck in a few weeks  Anemia - ? 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    Abnormal liver function test Hepatic Function Latest Ref Rng & Units 12/19/2019 12/17/2019 12/15/2019  Total Protein 6.5 - 8.1 g/dL 5.0(L) 7.1 7.3  Albumin 3.5 - 5.0 g/dL 2.6(L) 4.0 4.0  AST 15 - 41 U/L 33 92(H) 152(H)  ALT 0 - 44 U/L 29 53(H) 43  Alk Phosphatase 38 - 126 U/L 39 55 51  Total Bilirubin 0.3 - 1.2 mg/dL 0.9 0.9 1.9(H)   - improved       Discharge Exam: Vitals:   12/20/19 2031 12/21/19 0436  BP: (!) 152/81 (!) 165/86  Pulse: 90 96  Resp: 18 18  Temp: 99.2 F (37.3 C) 98.5 F (36.9 C)  SpO2: 92% 95%   Vitals:   12/20/19 0429 12/20/19 1134 12/20/19 2031 12/21/19 0436  BP: (!) 148/89 137/87 (!)  152/81 (!) 165/86  Pulse: 90 84 90 96  Resp: _0 Temp: 98.7 F (37.1 C) 98.9 F (37.2 C) 99.2 F (37.3 C) 98.5 F (36.9 C)  TempSrc: Oral Oral Oral Oral  SpO2: 95% 93% 92% 95%  Weight:    59.2 kg  Height:        General: Pt is alert, awake, not in acute distress Cardiovascular: RRR, S1/S2 +, no rubs, no gallops Respiratory: CTA bilaterally, no wheezing, no rhonchi Abdominal: Soft, NT, ND, bowel sounds + Extremities: no edema, no cyanosis   Discharge Instructions  Discharge Instructions    Diet - low sodium heart healthy   Complete by: As directed    Increase activity slowly   Complete by: As directed      Allergies as of 12/21/2019  Reactions   Penicillins Other (See Comments)   Yeast infection Did it involve swelling of the face/tongue/throat, SOB, or low BP? No Did it involve sudden or severe rash/hives, skin peeling, or any reaction on the inside of your mouth or nose? No Did you need to seek medical attention at a hospital or doctor's office? Yes When did it last happen? More than 5 years ago If all above answers are "NO", may proceed with cephalosporin use.      Medication List    STOP taking these medications   cephALEXin 500 MG capsule Commonly known as: KEFLEX   HYDROcodone-acetaminophen 10-325 MG tablet Commonly known as: NORCO   hydrOXYzine 25 MG capsule Commonly known as: VISTARIL   mirtazapine 15 MG tablet Commonly known as: REMERON   Oxycodone HCl 10 MG Tabs   tiZANidine 4 MG tablet Commonly known as: ZANAFLEX   zaleplon 10 MG capsule Commonly known as: Sonata     TAKE these medications   ALPRAZolam 1 MG tablet Commonly known as: Xanax Take 0.5 tablets (0.5 mg total) by mouth 3 (three) times daily as needed for anxiety. What changed: how much to take   azithromycin 500 MG tablet Commonly known as: ZITHROMAX Take 1 tablet (500 mg total) by mouth daily. Start taking on: December 22, 2019   busPIRone 10 MG  tablet Commonly known as: BUSPAR TAKE 1 TABLET BY MOUTH THREE TIMES A DAY   cefUROXime 500 MG tablet Commonly known as: CEFTIN Take 1 tablet (500 mg total) by mouth 2 (two) times daily with a meal. Start taking on: December 22, 2019   [facility] B-12 500 MCG tablet Generic drug: vitamin B-12 Take 500 mcg by mouth daily.   Melatonin 3-10 MG Tabs Take 3-10 mg by mouth at bedtime as needed. What changed: reasons to take this   nicotine 21 mg/24hr patch Commonly known as: NICODERM CQ - dosed in mg/24 hours Place 1 patch (21 mg total) onto the skin daily. Start taking on: December 22, 2019   ondansetron 8 MG disintegrating tablet Commonly known as: Zofran ODT 55m ODT q4 hours prn nausea What changed:   how much to take  how to take this  when to take this  reasons to take this   risperiDONE 3 MG tablet Commonly known as: RISPERDAL Take 1 tablet (3 mg total) by mouth at bedtime.   sertraline 100 MG tablet Commonly known as: ZOLOFT Take 2 tablets (200 mg total) by mouth at bedtime.       Follow-up Information    Avva, Ravisankar, MD. Schedule an appointment as soon as possible for a visit in 1 week(s).   Specialty: Internal Medicine Why: to follow up pneumonia Contact information: [address] [place] [phone]             Allergies  Allergen Reactions  . Penicillins Other (See Comments)    Yeast infection Did it involve swelling of the face/tongue/throat, SOB, or low BP? No Did it involve sudden or severe rash/hives, skin peeling, or any reaction on the inside of your mouth or nose? No Did you need to seek medical attention at a hospital or doctor's office? Yes When did it last happen? More than 5 years ago If all above answers are "NO", may proceed with cephalosporin use.       CT Angio Head W or Wo Contrast  Result Date: 12/15/2019 CLINICAL DATA:  Vertigo and left-sided weakness EXAM: CT ANGIOGRAPHY HEAD AND NECK TECHNIQUE:  Multidetector CT imaging of the head and neck was performed using the standard protocol during bolus administration of intravenous contrast. Multiplanar CT image reconstructions and MIPs were obtained to evaluate the vascular anatomy. Carotid stenosis measurements (when applicable) are obtained utilizing NASCET criteria, using the distal internal carotid diameter as the denominator. CONTRAST:  40m OMNIPAQUE IOHEXOL 350 MG/ML SOLN COMPARISON:  None. FINDINGS: CT HEAD FINDINGS Brain: There is no mass, hemorrhage or extra-axial collection. The size and configuration of the ventricles and extra-axial CSF spaces are normal. There is no acute or chronic infarction. The brain parenchyma is normal. Skull: The visualized skull base, calvarium and extracranial soft tissues are normal. Sinuses/Orbits: No fluid levels or advanced mucosal thickening of the visualized paranasal sinuses. No mastoid or middle ear effusion. The orbits are normal. CTA NECK FINDINGS SKELETON: There is no bony spinal canal stenosis. No lytic or blastic lesion. OTHER NECK: Normal pharynx, larynx and major salivary glands. No cervical lymphadenopathy. Unremarkable thyroid gland. UPPER CHEST: Hazy biapical opacities. AORTIC ARCH: There is no calcific atherosclerosis of the aortic arch. There is no aneurysm, dissection or hemodynamically significant stenosis of the visualized portion of the aorta. Conventional 3 vessel aortic branching pattern. The visualized proximal subclavian arteries are widely patent. RIGHT CAROTID SYSTEM: Normal without aneurysm, dissection or stenosis. LEFT CAROTID SYSTEM: Normal without aneurysm, dissection or stenosis. VERTEBRAL ARTERIES: Left dominant configuration. Both origins are clearly patent. There is no dissection, occlusion or flow-limiting stenosis to the skull base (V1-V3 segments). CTA HEAD FINDINGS POSTERIOR CIRCULATION: --Vertebral arteries: Normal V4 segments. --Inferior cerebellar arteries: Normal. --Basilar  artery: Normal. --Superior cerebellar arteries: Normal. --Posterior cerebral arteries (PCA): Normal. Both are predominantly supplied by the posterior communicating arteries (p-comm). ANTERIOR CIRCULATION: --Intracranial internal carotid arteries: Normal. --Anterior cerebral arteries (ACA): Normal. Both A1 segments are present. Patent anterior communicating artery (a-comm). --Middle cerebral arteries (MCA): Normal. VENOUS SINUSES: As permitted by contrast timing, patent. ANATOMIC VARIANTS: Fetal origins of both posterior cerebral arteries. Review of the MIP images confirms the above findings. IMPRESSION: 1. Normal CTA of the head and neck. 2. Hazy biapical opacities, which may indicate pulmonary edema or infection. Electronically Signed   By: KUlyses JarredM.D.   On: 12/15/2019 21:36   DG Chest 1 View  Result Date: 12/15/2019 CLINICAL DATA:  Multiple falls, altered mental status EXAM: CHEST  1 VIEW COMPARISON:  Radiograph 09/17/2019, CT 04/20/2019 FINDINGS: Some streaky opacities in the lung bases with low volumes favor atelectasis. No consolidation, features of edema, pneumothorax, or effusion. Pacer pack overlies the left chest wall with leads at the right atrium and cardiac apex in similar position to prior. The aorta is calcified. The remaining cardiomediastinal contours are unremarkable. No visible displaced rib fracture or other acute traumatic osseous injury of the chest wall. Mild degenerative changes in the shoulders and spine. Soft tissues are unremarkable. IMPRESSION: 1. Streaky opacities in the lung bases favor atelectasis. No acute cardiopulmonary abnormality. 2. No visible displaced rib fracture or other acute traumatic osseous injury of the chest wall. Electronically Signed   By: PLovena LeM.D.   On: 12/15/2019 20:41   CT Angio Neck W and/or Wo Contrast  Result Date: 12/15/2019 CLINICAL DATA:  Vertigo and left-sided weakness EXAM: CT ANGIOGRAPHY HEAD AND NECK TECHNIQUE: Multidetector CT  imaging of the head and neck was performed using the standard protocol during bolus administration of intravenous contrast. Multiplanar CT image reconstructions and MIPs were obtained to evaluate the vascular anatomy. Carotid stenosis measurements (when applicable) are obtained utilizing  NASCET criteria, using the distal internal carotid diameter as the denominator. CONTRAST:  98m OMNIPAQUE IOHEXOL 350 MG/ML SOLN COMPARISON:  None. FINDINGS: CT HEAD FINDINGS Brain: There is no mass, hemorrhage or extra-axial collection. The size and configuration of the ventricles and extra-axial CSF spaces are normal. There is no acute or chronic infarction. The brain parenchyma is normal. Skull: The visualized skull base, calvarium and extracranial soft tissues are normal. Sinuses/Orbits: No fluid levels or advanced mucosal thickening of the visualized paranasal sinuses. No mastoid or middle ear effusion. The orbits are normal. CTA NECK FINDINGS SKELETON: There is no bony spinal canal stenosis. No lytic or blastic lesion. OTHER NECK: Normal pharynx, larynx and major salivary glands. No cervical lymphadenopathy. Unremarkable thyroid gland. UPPER CHEST: Hazy biapical opacities. AORTIC ARCH: There is no calcific atherosclerosis of the aortic arch. There is no aneurysm, dissection or hemodynamically significant stenosis of the visualized portion of the aorta. Conventional 3 vessel aortic branching pattern. The visualized proximal subclavian arteries are widely patent. RIGHT CAROTID SYSTEM: Normal without aneurysm, dissection or stenosis. LEFT CAROTID SYSTEM: Normal without aneurysm, dissection or stenosis. VERTEBRAL ARTERIES: Left dominant configuration. Both origins are clearly patent. There is no dissection, occlusion or flow-limiting stenosis to the skull base (V1-V3 segments). CTA HEAD FINDINGS POSTERIOR CIRCULATION: --Vertebral arteries: Normal V4 segments. --Inferior cerebellar arteries: Normal. --Basilar artery: Normal.  --Superior cerebellar arteries: Normal. --Posterior cerebral arteries (PCA): Normal. Both are predominantly supplied by the posterior communicating arteries (p-comm). ANTERIOR CIRCULATION: --Intracranial internal carotid arteries: Normal. --Anterior cerebral arteries (ACA): Normal. Both A1 segments are present. Patent anterior communicating artery (a-comm). --Middle cerebral arteries (MCA): Normal. VENOUS SINUSES: As permitted by contrast timing, patent. ANATOMIC VARIANTS: Fetal origins of both posterior cerebral arteries. Review of the MIP images confirms the above findings. IMPRESSION: 1. Normal CTA of the head and neck. 2. Hazy biapical opacities, which may indicate pulmonary edema or infection. Electronically Signed   By: KUlyses JarredM.D.   On: 12/15/2019 21:36   DG Chest Port 1 View  Result Date: 12/17/2019 CLINICAL DATA:  Recently diagnosed with pneumonia, increasing lethargy EXAM: PORTABLE CHEST 1 VIEW COMPARISON:  Radiograph 12/15/2019 CT 04/20/2019 FINDINGS: Low lung volumes with streaky opacities in the lung bases which likely reflect atelectasis though early atypical infection or edema could have a similar appearance. No focal consolidation. Blunting of the left costophrenic sulcus could reflect development of a trace left effusion. No right effusion or pneumothorax. Pacer pack overlies the left chest wall with leads in stable position at the right atrium and cardiac apex. The aorta is calcified. The remaining cardiomediastinal contours are unremarkable. No acute osseous or soft tissue abnormality. Prior cervical fusion, incompletely assessed on this exam. Telemetry leads overlie the chest. IMPRESSION: Low lung volumes with streaky opacities in the lung bases which likely reflect atelectasis though early atypical infection or edema could have a similar appearance. New blunting of left costophrenic sulcus may reflect trace effusion. Aortic Atherosclerosis (ICD10-I70.0). Electronically Signed   By:  PLovena LeM.D.   On: 12/17/2019 21:27   ECHOCARDIOGRAM COMPLETE  Result Date: 12/18/2019    ECHOCARDIOGRAM REPORT   Patient Name:   TSHAQUERA ANSLEYDate of Exam: 12/18/2019 Medical Rec #:  [id number]     Height:       67.0 in Accession #:    [id number]    Weight:       137.1 lb Date of Birth:  802/23/1957     BSA:  1.723 m Patient Age:    64 years       BP:           141/91 mmHg Patient Gender: F              HR:           88 bpm. Exam Location:  Inpatient Procedure: 2D Echo, Cardiac Doppler and Color Doppler Indications:    Syncope  History:        Patient has prior history of Echocardiogram examinations, most                 recent 07/09/2017. Pacemaker, Signs/Symptoms:Syncope; Risk                 Factors:Current Smoker.  Sonographer:    Clayton Lefort RDCS (AE) Referring Phys: [place]  1. Left ventricular ejection fraction, by estimation, is 55 to 60%. The left ventricle has normal function. The left ventricle has no regional wall motion abnormalities. There is mild left ventricular hypertrophy. Left ventricular diastolic parameters are consistent with Grade I diastolic dysfunction (impaired relaxation).  2. Right ventricular systolic function is normal. The right ventricular size is normal. Tricuspid regurgitation signal is inadequate for assessing PA pressure.  3. The mitral valve is normal in structure. Trivial mitral valve regurgitation. No evidence of mitral stenosis.  4. The aortic valve is tricuspid. Aortic valve regurgitation is not visualized. Mild aortic valve sclerosis is present, with no evidence of aortic valve stenosis.  5. The inferior vena cava is normal in size with greater than 50% respiratory variability, suggesting right atrial pressure of 3 mmHg. FINDINGS  Left Ventricle: Left ventricular ejection fraction, by estimation, is 55 to 60%. The left ventricle has normal function. The left ventricle has no regional wall motion abnormalities. The left ventricular  internal cavity size was normal in size. There is  mild left ventricular hypertrophy. Left ventricular diastolic parameters are consistent with Grade I diastolic dysfunction (impaired relaxation). Right Ventricle: The right ventricular size is normal. Right ventricular systolic function is normal. Tricuspid regurgitation signal is inadequate for assessing PA pressure. The tricuspid regurgitant velocity is 1.80 m/s, and with an assumed right atrial  pressure of 3 mmHg, the estimated right ventricular systolic pressure is 45.4 mmHg. Left Atrium: Left atrial size was normal in size. Right Atrium: Right atrial size was normal in size. Pericardium: There is no evidence of pericardial effusion. Mitral Valve: The mitral valve is normal in structure. Trivial mitral valve regurgitation. No evidence of mitral valve stenosis. MV peak gradient, 3.6 mmHg. The mean mitral valve gradient is 1.0 mmHg. Tricuspid Valve: The tricuspid valve is normal in structure. Tricuspid valve regurgitation is trivial. No evidence of tricuspid stenosis. Aortic Valve: The aortic valve is tricuspid. Aortic valve regurgitation is not visualized. Mild aortic valve sclerosis is present, with no evidence of aortic valve stenosis. Aortic valve mean gradient measures 4.0 mmHg. Aortic valve peak gradient measures 6.6 mmHg. Aortic valve area, by VTI measures 2.31 cm. Pulmonic Valve: The pulmonic valve was not well visualized. Pulmonic valve regurgitation is not visualized. No evidence of pulmonic stenosis. Aorta: The aortic root is normal in size and structure. Venous: The inferior vena cava is normal in size with greater than 50% respiratory variability, suggesting right atrial pressure of 3 mmHg. IAS/Shunts: No atrial level shunt detected by color flow Doppler. Additional Comments: A pacer wire is visualized.  LEFT VENTRICLE PLAX 2D LVIDd:  3.70 cm  Diastology LVIDs:         2.50 cm  LV e' medial:    9.03 cm/s LV PW:         1.20 cm  LV E/e'  medial:  8.9 LV IVS:        1.20 cm  LV e' lateral:   7.62 cm/s LVOT diam:     1.90 cm  LV E/e' lateral: 10.5 LV SV:         59 LV SV Index:   34 LVOT Area:     2.84 cm  RIGHT VENTRICLE             IVC RV Basal diam:  2.90 cm     IVC diam: 2.20 cm RV S prime:     14.00 cm/s TAPSE (M-mode): 2.1 cm LEFT ATRIUM             Index       RIGHT ATRIUM           Index LA diam:        2.50 cm 1.45 cm/m  RA Area:     11.40 cm LA Vol (A2C):   47.2 ml 27.40 ml/m RA Volume:   22.90 ml  13.29 ml/m LA Vol (A4C):   20.8 ml 12.08 ml/m LA Biplane Vol: 32.5 ml 18.87 ml/m  AORTIC VALVE AV Area (Vmax):    2.26 cm AV Area (Vmean):   1.93 cm AV Area (VTI):     2.31 cm AV Vmax:           128.00 cm/s AV Vmean:          96.500 cm/s AV VTI:            0.257 m AV Peak Grad:      6.6 mmHg AV Mean Grad:      4.0 mmHg LVOT Vmax:         102.00 cm/s LVOT Vmean:        65.600 cm/s LVOT VTI:          0.209 m LVOT/AV VTI ratio: 0.81  AORTA Ao Root diam: 3.10 cm Ao Asc diam:  3.00 cm MITRAL VALVE               TRICUSPID VALVE MV Area (PHT): 4.06 cm    TR Peak grad:   13.0 mmHg MV Peak grad:  3.6 mmHg    TR Vmax:        180.00 cm/s MV Mean grad:  1.0 mmHg MV Vmax:       0.96 m/s    SHUNTS MV Vmean:      55.3 cm/s   Systemic VTI:  0.21 m MV Decel Time: 187 msec    Systemic Diam: 1.90 cm MV E velocity: 80.10 cm/s MV A velocity: 84.80 cm/s MV E/A ratio:  0.94 Kirk Ruths MD Electronically signed by Kirk Ruths MD Signature Date/Time: 12/18/2019/3:33:36 PM    Final    CUP PACEART REMOTE DEVICE CHECK  Result Date: 12/03/2019 Scheduled remote reviewed. Normal device function.  Nine atrial arrhythmias detected, some appear to be far field R wave sensing, all are less than one minute.  There were four ventricular high rates detected and all appear to be supraventricular in nature. Next remote 91 days. Kathy Breach, RN, CCDS, [employer] Scheduled remote reviewed. Normal device function.  Nine atrial arrhythmias detected, some  appear to be far field R wave sensing, all are less than one  minute.  There were four ventricular high rates detected and all appear to be supraventricular in nature. Next remote 91 days. Kathy Breach, RN, CCDS, [employer]  LE ART SEG MULTI (Segm & LE Reynauds)  Result Date: 12/10/2019 LOWER EXTREMITY DOPPLER STUDY Indications: Patient has had both legs turn red and are hot to the touch for              about 3 months. She is not walking much so unable to determine              claudication symptoms. Patient denies rest pain. High Risk Factors: Current smoker.  Comparison Study: None Performing Technologist: Salvadore Dom RVT  Examination Guidelines: A complete evaluation includes at minimum, Doppler waveform signals and systolic blood pressure reading at the level of bilateral brachial, anterior tibial, and posterior tibial arteries, when vessel segments are accessible. Bilateral testing is considered an integral part of a complete examination. Photoelectric Plethysmograph (PPG) waveforms and toe systolic pressure readings are included as required and additional duplex testing as needed. Limited examinations for reoccurring indications may be performed as noted.  ABI Findings: +---------+------------------+-----+---------+--------+ Right    Rt Pressure (mmHg)IndexWaveform Comment  +---------+------------------+-----+---------+--------+ Brachial 141                                      +---------+------------------+-----+---------+--------+ CFA                             triphasic         +---------+------------------+-----+---------+--------+ Popliteal                       triphasic         +---------+------------------+-----+---------+--------+ ATA      142               1.01 triphasic         +---------+------------------+-----+---------+--------+ PTA      165               1.17 triphasic         +---------+------------------+-----+---------+--------+ PERO      145               1.03 triphasic         +---------+------------------+-----+---------+--------+ [place]               0.97 Normal            +---------+------------------+-----+---------+--------+ +---------+------------------+-----+---------+-------+ Left     Lt Pressure (mmHg)IndexWaveform Comment +---------+------------------+-----+---------+-------+ Brachial 138                                     +---------+------------------+-----+---------+-------+ CFA                             triphasic        +---------+------------------+-----+---------+-------+ Popliteal                       triphasic        +---------+------------------+-----+---------+-------+ ATA      153               1.09 triphasic        +---------+------------------+-----+---------+-------+ PTA  166               1.18 triphasic        +---------+------------------+-----+---------+-------+ PERO     148               1.05 triphasic        +---------+------------------+-----+---------+-------+ Great Toe116               0.82 Normal           +---------+------------------+-----+---------+-------+ +-------+-----------+-----------+------------+------------+ ABI/TBIToday's ABIToday's TBIPrevious ABIPrevious TBI +-------+-----------+-----------+------------+------------+ Right  1.17       .97                                 +-------+-----------+-----------+------------+------------+ Left   1.18       .82                                 +-------+-----------+-----------+------------+------------+   Summary: Right: Resting right ankle-brachial index is within normal range. No evidence of significant right lower extremity arterial disease. The right toe-brachial index is normal. Left: Resting left ankle-brachial index is within normal range. No evidence of significant left lower extremity arterial disease. The left toe-brachial index is normal.  *See table(s) above for  measurements and observations.  Electronically signed by Carlyle Dolly MD on 12/10/2019 at 12:36:29 PM.    Final      The results of significant diagnostics from this hospitalization (including imaging, microbiology, ancillary and laboratory) are listed below for reference.     Microbiology: Recent Results (from the past 240 hour(s))  Culture, blood (routine x 2)     Status: None (Preliminary result)   Collection Time: 12/17/19  9:14 PM   Specimen: BLOOD  Result Value Ref Range Status   Specimen Description BLOOD RIGHT ANTECUBITAL  Final   Special Requests   Final    BOTTLES DRAWN AEROBIC AND ANAEROBIC Blood Culture adequate volume   Culture   Final    NO GROWTH 3 DAYS Performed at [facility], [address], [place], [place] [address]    Report Status PENDING  Incomplete  Respiratory Panel by RT PCR (Flu A&B, Covid) - Nasopharyngeal Swab     Status: None   Collection Time: 12/17/19 10:11 PM   Specimen: Nasopharyngeal Swab  Result Value Ref Range Status   SARS Coronavirus 2 by RT PCR NEGATIVE NEGATIVE Final    Comment: (NOTE) SARS-CoV-2 target nucleic acids are NOT DETECTED.  The SARS-CoV-2 RNA is generally detectable in upper respiratoy specimens during the acute phase of infection. The lowest concentration of SARS-CoV-2 viral copies this assay can detect is 131 copies/mL. A negative result does not preclude SARS-Cov-2 infection and should not be used as the sole basis for treatment or other patient management decisions. A negative result may occur with  improper specimen collection/handling, submission of specimen other than nasopharyngeal swab, presence of viral mutation(s) within the areas targeted by this assay, and inadequate number of viral copies (<131 copies/mL). A negative result must be combined with clinical observations, patient history, and epidemiological information. The expected result is Negative.  Fact Sheet for Patients:   PinkCheek.be  Fact Sheet for Healthcare Providers:  GravelBags.it  This test is no t yet approved or cleared by the [place] FDA and  has been authorized for detection and/or diagnosis of SARS-CoV-2 by FDA under an Emergency  Use Authorization (EUA). This EUA will remain  in effect (meaning this test can be used) for the duration of the COVID-19 declaration under Section 564(b)(1) of the Act, 21 U.S.C. section 360bbb-3(b)(1), unless the authorization is terminated or revoked sooner.     Influenza A by PCR NEGATIVE NEGATIVE Final   Influenza B by PCR NEGATIVE NEGATIVE Final    Comment: (NOTE) The Xpert Xpress SARS-CoV-2/FLU/RSV assay is intended as an aid in  the diagnosis of influenza from Nasopharyngeal swab specimens and  should not be used as a sole basis for treatment. Nasal washings and  aspirates are unacceptable for Xpert Xpress SARS-CoV-2/FLU/RSV  testing.  Fact Sheet for Patients: PinkCheek.be  Fact Sheet for Healthcare Providers: GravelBags.it  This test is not yet approved or cleared by the [place] FDA and  has been authorized for detection and/or diagnosis of SARS-CoV-2 by  FDA under an Emergency Use Authorization (EUA). This EUA will remain  in effect (meaning this test can be used) for the duration of the  Covid-19 declaration under Section 564(b)(1) of the Act, 21  U.S.C. section 360bbb-3(b)(1), unless the authorization is  terminated or revoked. Performed at [facility], [place] [address], [place], [place] [address]   Culture, blood (routine x 2)     Status: None (Preliminary result)   Collection Time: 12/17/19 10:20 PM   Specimen: BLOOD  Result Value Ref Range Status   Specimen Description BLOOD LEFT ANTECUBITAL  Final   Special Requests   Final    BOTTLES DRAWN AEROBIC AND ANAEROBIC Blood Culture results may not be optimal due  to an inadequate volume of blood received in culture bottles   Culture   Final    NO GROWTH 3 DAYS Performed at [facility], [place] [address] [place], [place] [address]    Report Status PENDING  Incomplete     Labs: BNP (last 3 results) Recent Labs    12/18/19 1015  BNP 97.6   Basic Metabolic Panel: Recent Labs  Lab 12/15/19 2027 12/15/19 2027 12/15/19 2035 12/15/19 2035 12/17/19 2114 12/18/19 1015 12/19/19 0336 12/20/19 0406 12/21/19 0425  NA 136   < > 136  --  141  --  139 139 140  K 4.2   < > 4.1  --  3.3*  --  3.0* 3.3* 3.8  CL 103   < > 104  --  107  --  108 107 108  CO2 18*  --   --   --  22  --  _0 GLUCOSE 97   < > 91  --  118*  --  90 95 102*  BUN 13   < > 11  --  8  --  <5* <5* 7*  CREATININE 1.12*   < > 1.00   < > 0.91 0.70 0.76 0.74 0.78  CALCIUM 9.3  --   --   --  9.8  --  8.6* 8.8* 9.1  MG  --   --   --   --   --   --  1.7  --   --   PHOS  --   --   --   --   --   --  3.2  --   --    < > = values in this interval not displayed.   Liver Function Tests: Recent Labs  Lab 12/15/19 2027 12/17/19 2114 12/19/19 0336  AST 152* 92* 33  ALT 43 53*  29  ALKPHOS 51 55 39  BILITOT 1.9* 0.9 0.9  PROT 7.3 7.1 5.0*  ALBUMIN 4.0 4.0 2.6*   No results for input(s): LIPASE, AMYLASE in the last 168 hours. Recent Labs  Lab 12/18/19 0250  AMMONIA 17   CBC: Recent Labs  Lab 12/15/19 2027 12/15/19 2035 12/17/19 2114 12/18/19 1015  WBC 17.9*  --  10.4 8.8  NEUTROABS 15.5*  --  6.6  --   HGB 15.2* 16.0* 14.9 11.5*  HCT 46.4* 47.0* 46.4* 36.8  MCV 87.7  --  89.9 90.4  PLT 300  --  362 276   Cardiac Enzymes: Recent Labs  Lab 12/19/19 0336  CKTOTAL 836*   BNP: Invalid input(s): POCBNP CBG: No results for input(s): GLUCAP in the last 168 hours. D-Dimer No results for input(s): DDIMER in the last 72 hours. Hgb A1c No results for input(s): HGBA1C in the last 72 hours. Lipid Profile No results for input(s): CHOL, HDL, LDLCALC, TRIG,  CHOLHDL, LDLDIRECT in the last 72 hours. Thyroid function studies No results for input(s): TSH, T4TOTAL, T3FREE, THYROIDAB in the last 72 hours.  Invalid input(s): FREET3 Anemia work up Recent Labs    12/18/19 1450  FOLATE 6.4  FERRITIN 110  TIBC 221*  IRON 33  RETICCTPCT 1.0   Urinalysis    Component Value Date/Time   COLORURINE YELLOW 12/18/2019 [place] 12/18/2019 0752   LABSPEC 1.021 12/18/2019 0752   PHURINE 5.0 12/18/2019 0752   GLUCOSEU NEGATIVE 12/18/2019 0752   HGBUR NEGATIVE 12/18/2019 0752   BILIRUBINUR NEGATIVE 12/18/2019 0752   KETONESUR 5 (A) 12/18/2019 0752   PROTEINUR NEGATIVE 12/18/2019 0752   NITRITE NEGATIVE 12/18/2019 0752   LEUKOCYTESUR NEGATIVE 12/18/2019 0752   Sepsis Labs Invalid input(s): PROCALCITONIN,  WBC,  LACTICIDVEN Microbiology Recent Results (from the past 240 hour(s))  Culture, blood (routine x 2)     Status: None (Preliminary result)   Collection Time: 12/17/19  9:14 PM   Specimen: BLOOD  Result Value Ref Range Status   Specimen Description BLOOD RIGHT ANTECUBITAL  Final   Special Requests   Final    BOTTLES DRAWN AEROBIC AND ANAEROBIC Blood Culture adequate volume   Culture   Final    NO GROWTH 3 DAYS Performed at [facility], [place] [address] [place], [place] [address]    Report Status PENDING  Incomplete  Respiratory Panel by RT PCR (Flu A&B, Covid) - Nasopharyngeal Swab     Status: None   Collection Time: 12/17/19 10:11 PM   Specimen: Nasopharyngeal Swab  Result Value Ref Range Status   SARS Coronavirus 2 by RT PCR NEGATIVE NEGATIVE Final    Comment: (NOTE) SARS-CoV-2 target nucleic acids are NOT DETECTED.  The SARS-CoV-2 RNA is generally detectable in upper respiratoy specimens during the acute phase of infection. The lowest concentration of SARS-CoV-2 viral copies this assay can detect is 131 copies/mL. A negative result does not preclude SARS-Cov-2 infection and should not be used as the sole  basis for treatment or other patient management decisions. A negative result may occur with  improper specimen collection/handling, submission of specimen other than nasopharyngeal swab, presence of viral mutation(s) within the areas targeted by this assay, and inadequate number of viral copies (<131 copies/mL). A negative result must be combined with clinical observations, patient history, and epidemiological information. The expected result is Negative.  Fact Sheet for Patients:  PinkCheek.be  Fact Sheet for Healthcare Providers:  GravelBags.it  This test is no t  yet approved or cleared by the [place] and  has been authorized for detection and/or diagnosis of SARS-CoV-2 by FDA under an Emergency Use Authorization (EUA). This EUA will remain  in effect (meaning this test can be used) for the duration of the COVID-19 declaration under Section 564(b)(1) of the Act, 21 U.S.C. section 360bbb-3(b)(1), unless the authorization is terminated or revoked sooner.     Influenza A by PCR NEGATIVE NEGATIVE Final   Influenza B by PCR NEGATIVE NEGATIVE Final    Comment: (NOTE) The Xpert Xpress SARS-CoV-2/FLU/RSV assay is intended as an aid in  the diagnosis of influenza from Nasopharyngeal swab specimens and  should not be used as a sole basis for treatment. Nasal washings and  aspirates are unacceptable for Xpert Xpress SARS-CoV-2/FLU/RSV  testing.  Fact Sheet for Patients: PinkCheek.be  Fact Sheet for Healthcare Providers: GravelBags.it  This test is not yet approved or cleared by the [place] FDA and  has been authorized for detection and/or diagnosis of SARS-CoV-2 by  FDA under an Emergency Use Authorization (EUA). This EUA will remain  in effect (meaning this test can be used) for the duration of the  Covid-19 declaration under Section 564(b)(1) of the  Act, 21  U.S.C. section 360bbb-3(b)(1), unless the authorization is  terminated or revoked. Performed at [facility], [place] [address], [place], [place] [address]   Culture, blood (routine x 2)     Status: None (Preliminary result)   Collection Time: 12/17/19 10:20 PM   Specimen: BLOOD  Result Value Ref Range Status   Specimen Description BLOOD LEFT ANTECUBITAL  Final   Special Requests   Final    BOTTLES DRAWN AEROBIC AND ANAEROBIC Blood Culture results may not be optimal due to an inadequate volume of blood received in culture bottles   Culture   Final    NO GROWTH 3 DAYS Performed at [facility], [place] [address], [place],  [address]    Report Status PENDING  Incomplete     Time coordinating discharge in minutes: 65  SIGNED:   Debbe Odea, MD  [facility] 12/21/2019, 10:53 AM

## 2019-12-22 DIAGNOSIS — J189 Pneumonia, unspecified organism: Secondary | ICD-10-CM | POA: Diagnosis not present

## 2019-12-22 DIAGNOSIS — E782 Mixed hyperlipidemia: Secondary | ICD-10-CM | POA: Diagnosis not present

## 2019-12-22 LAB — LYME DISEASE, WESTERN BLOT
IgG P18 Ab.: ABSENT
IgG P23 Ab.: ABSENT
IgG P28 Ab.: ABSENT
IgG P30 Ab.: ABSENT
IgG P39 Ab.: ABSENT
IgG P41 Ab.: ABSENT
IgG P45 Ab.: ABSENT
IgG P58 Ab.: ABSENT
IgG P66 Ab.: ABSENT
IgG P93 Ab.: ABSENT
IgM P39 Ab.: ABSENT
IgM P41 Ab.: ABSENT
Lyme IgG Wb: NEGATIVE
Lyme IgM Wb: NEGATIVE

## 2019-12-22 LAB — CULTURE, BLOOD (ROUTINE X 2)
Culture: NO GROWTH
Culture: NO GROWTH
Special Requests: ADEQUATE

## 2019-12-26 DIAGNOSIS — Z20822 Contact with and (suspected) exposure to covid-19: Secondary | ICD-10-CM | POA: Diagnosis not present

## 2019-12-26 DIAGNOSIS — R059 Cough, unspecified: Secondary | ICD-10-CM | POA: Diagnosis not present

## 2020-01-01 ENCOUNTER — Other Ambulatory Visit: Payer: Self-pay | Admitting: Family Medicine

## 2020-01-01 ENCOUNTER — Ambulatory Visit
Admission: RE | Admit: 2020-01-01 | Discharge: 2020-01-01 | Disposition: A | Discharge status: Home / Self Care | Payer: Medicare HMO | Source: Ambulatory Visit | Attending: Family Medicine | Admitting: Family Medicine

## 2020-01-01 ENCOUNTER — Other Ambulatory Visit: Payer: Self-pay

## 2020-01-01 DIAGNOSIS — R918 Other nonspecific abnormal finding of lung field: Secondary | ICD-10-CM | POA: Diagnosis not present

## 2020-01-01 DIAGNOSIS — J189 Pneumonia, unspecified organism: Secondary | ICD-10-CM

## 2020-01-01 DIAGNOSIS — J984 Other disorders of lung: Secondary | ICD-10-CM | POA: Diagnosis not present

## 2020-01-01 DIAGNOSIS — I700 Atherosclerosis of aorta: Secondary | ICD-10-CM | POA: Diagnosis not present

## 2020-01-01 DIAGNOSIS — Z950 Presence of cardiac pacemaker: Secondary | ICD-10-CM | POA: Diagnosis not present

## 2020-01-04 ENCOUNTER — Other Ambulatory Visit: Payer: Self-pay

## 2020-01-04 ENCOUNTER — Ambulatory Visit: Payer: Medicare HMO | Admitting: Neurology

## 2020-01-04 ENCOUNTER — Encounter: Payer: Self-pay | Admitting: Neurology

## 2020-01-04 VITALS — BP 114/86 | HR 116 | Ht 67.0 in

## 2020-01-04 DIAGNOSIS — R269 Unspecified abnormalities of gait and mobility: Secondary | ICD-10-CM | POA: Diagnosis not present

## 2020-01-04 DIAGNOSIS — R748 Abnormal levels of other serum enzymes: Secondary | ICD-10-CM | POA: Diagnosis not present

## 2020-01-04 DIAGNOSIS — E876 Hypokalemia: Secondary | ICD-10-CM

## 2020-01-04 DIAGNOSIS — R29898 Other symptoms and signs involving the musculoskeletal system: Secondary | ICD-10-CM | POA: Diagnosis not present

## 2020-01-04 DIAGNOSIS — R413 Other amnesia: Secondary | ICD-10-CM | POA: Diagnosis not present

## 2020-01-04 DIAGNOSIS — E538 Deficiency of other specified B group vitamins: Secondary | ICD-10-CM

## 2020-01-04 NOTE — Progress Notes (Signed)
[facility]  PATIENT: [name] DOB: 06-Jan-1956  REFERRING DOCTOR OR PCP: Dr. Lujean Amel SOURCE: Patient, notes from Dr. Rolena Infante, imaging reports and lumbar MRI and CT images personally reviewed.   _________________________________   HISTORICAL  CHIEF COMPLAINT:  Chief Complaint  Patient presents with  . Follow-up    RM 12. Last seen 07/02/19. Here to f/u on memory issues. Last MOCA 21/30. Today's MOCA: 22/30. Referred back to our office by pain specialist. She was at [facility] 12/15/19 for pneumonia/sepsis. EMS was called d/t her being disoriented and falling. She was discharged and then went back 12/17/19-12/21/19. They are wanting in home PT ordered. She states she is too weak to stand to obtain weight today.     HISTORY OF PRESENT ILLNESS:  [name] is a 64 y.o. woman with reduced memory and poor gait.  01/04/2020: I have seen her for memory loss and lumbar spine issues in the past.   Is now having some more difficulties with her gait and had mental status issues last month.  She was hospitalized last month for pneumonia and sepsis.   At the time she was disoriented and had a fall.    Since the admission she has had leg weakness. She has not done any PT since the discharge.     She is currently living with her mother but was living independently before the admission.   Her daughter feels she is cognitively close to her baseline but was confused a few days while more sick..    She has no diplopia or ptosis.   No arm weakness.      At baseline she is independent bu ther family helps her with some tasks.   She would walk to a gas station next to her apartment and do one flight of stairs.   Due to back pain, she has been less active but even when pain improved she did not do more activity.   She has generally done less for herself this year than last year.  Labs also showed low B12 (146 with 180-914 normal).  CK was elevated at 836 (<234 is normal).    TSH,  ammonia, lactic acid was normal.   She sees Dr Nelva Bush for pain.  Recent CT angiogram 10/07/2019 shows Prior C4-C6 ACDF with solid interbody fusion. No residual or recurrent stenosis.  Progressive mild adjacent segment disease at C6-C7 without stenosis or impingement. Emphysema.  She is sleeping 8 hours sleep most nights.    No change in bladder or urine color.      Montreal Cognitive Assessment  01/04/2020 07/02/2019  Visuospatial/ Executive (0/5) 2 2  Naming (0/3) 3 2  Attention: Read list of digits (0/2) 2 2  Attention: Read list of letters (0/1) 1 1  Attention: Serial 7 subtraction starting at 100 (0/3) 1 2  Language: Repeat phrase (0/2) 2 2  Language : Fluency (0/1) 0 0  Abstraction (0/2) 2 2  Delayed Recall (0/5) 3 2  Orientation (0/6) 5 5  Total 21 20  Adjusted Score (based on education) 22 21    Imaging  CT angiogram of the head and neck 12/15/2019 was normal  CT scan of the head 05/26/2019: There is mild cortical atrophy, most pronounced in the parietal lobes.  Brain parenchyma appeared normal.  There were no acute findings.  CT scan lumbar spine 01/06/2019 showed prior fusion and posterior decompression at the L4-5 level with incorporation within the intervertebral disc spacer, however minimal incorporation across  the remaining endplates at this level. Hardware is intact without evidence of loosening.   Probable mild canal stenosis at the L3-4 level.  L4-5 far lateral disc material appears to contact the exited left L4 nerve root. . Central disc protrusion at L5-S1 without evidence of canal stenosis.  MRI of the lumbar spine 09/11/2005 showed mild spinal stenosis at L4-L5 due to disc bulging, facet hypertrophy and ligamenta flava hypertrophy.  There was lateral recess stenosis.  L5-S1 showed facet hypertrophy but no spinal stenosis or foraminal narrowing.  Other levels appeared normal.  REVIEW OF SYSTEMS: Constitutional: No fevers, chills, sweats, or change in appetite Eyes: No  visual changes, double vision, eye pain Ear, nose and throat: No hearing loss, ear pain, nasal congestion, sore throat Cardiovascular: No chest pain, palpitations Respiratory: No shortness of breath at rest or with exertion.   No wheezes GastrointestinaI: No nausea, vomiting, diarrhea, abdominal pain, fecal incontinence Genitourinary: No dysuria, urinary retention or frequency.  No nocturia. Musculoskeletal:As above  integumentary: No rash, pruritus, skin lesions Neurological: as above Psychiatric: No depression at this time.  No anxiety Endocrine: No palpitations, diaphoresis, change in appetite, change in weigh or increased thirst Hematologic/Lymphatic: No anemia, purpura, petechiae. Allergic/Immunologic: No itchy/runny eyes, nasal congestion, recent allergic reactions, rashes  ALLERGIES: Allergies  Allergen Reactions  . Penicillins Other (See Comments)    Yeast infection Did it involve swelling of the face/tongue/throat, SOB, or low BP? No Did it involve sudden or severe rash/hives, skin peeling, or any reaction on the inside of your mouth or nose? No Did you need to seek medical attention at a hospital or doctor's office? Yes When did it last happen? More than 5 years ago If all above answers are "NO", may proceed with cephalosporin use.     HOME MEDICATIONS:  Current Outpatient Medications:  .  ALPRAZolam (XANAX) 1 MG tablet, Take 0.5 tablets (0.5 mg total) by mouth 3 (three) times daily as needed for anxiety. (Patient taking differently: Take 3 mg by mouth daily. ), Disp: 90 tablet, Rfl: 1 .  [employer] B-12 500 MCG tablet, Take 500 mcg by mouth daily., Disp: , Rfl:  .  cyclobenzaprine (FLEXERIL) 10 MG tablet, Take 10 mg by mouth at bedtime., Disp: , Rfl:  .  HYDROcodone-acetaminophen (NORCO) 10-325 MG tablet, Take 1 tablet by mouth every 6 (six) hours as needed., Disp: , Rfl:  .  risperiDONE (RISPERDAL) 3 MG tablet, Take 1 tablet (3 mg total) by mouth at bedtime., Disp: 90  tablet, Rfl: 0  PAST MEDICAL HISTORY: Past Medical History:  Diagnosis Date  . Anxiety    anxiety and mild depressive order systoms  . Bipolar disorder ([place])     (02/20/2013)  . Cervical cancer ([place]) 03/19/1980  . Depression   . Dysrhythmia   . ZOXWRUEA(540.9)    "monthly" (02/20/2013)  . Pacemaker    medtronic  . PONV (postoperative nausea and vomiting)   . Sinus pause 02/14/2013  . Syncope and collapse    echo-April 12,2012-EF 55% Echo normal; recorder with prolonged sinus pauses --> status post  . Tobacco abuse   . Vertigo     PAST SURGICAL HISTORY: Past Surgical History:  Procedure Laterality Date  . ABDOMINAL HYSTERECTOMY  03/19/1980   "partial"  . BACK SURGERY    . CERVICAL FUSION Left 11/2013   C4-C6  . CERVICAL FUSION     3,4,5,  . INSERT / REPLACE / REMOVE PACEMAKER  02/20/2013   medtronic  . LOOP RECORDER IMPLANT N/A  11/06/2012   Procedure: LINQ LOOP RECORDER IMPLANT;  Surgeon: Sanda Klein, MD;  Location: [facility];  Service: Cardiovascular;  Laterality: N/A;  . NECK SURGERY  11/17/2013  . PACEMAKER PLACEMENT N/A 02/16/2013  . PERMANENT PACEMAKER INSERTION N/A 02/20/2013   Procedure: PERMANENT PACEMAKER INSERTION;  Surgeon: Sanda Klein, MD;  Location: [facility];  Service: Cardiovascular;  Laterality: N/A;  . POSTERIOR LUMBAR FUSION  ~ 2009  . ROBOTIC ASSISTED SALPINGO OOPHERECTOMY Bilateral 11/25/2018   Procedure: XI ROBOTIC ASSISTED BILATERAL SALPINGO OOPHORECTOMY and LYSIS OF ADHESIONS.;  Surgeon: Everitt Amber, MD;  Location: [facility];  Service: Gynecology;  Laterality: Bilateral;  . SPINAL FUSION  03/19/2014  . TONSILLECTOMY    . TRANSTHORACIC ECHOCARDIOGRAM  07/11/2010   The left atrial size is normal.There is no evidence of mitral vavle prolaspe.Right ventriicular systolic pressure is normal . Injection of contrast documented no interatrial shunt. Essentially normal 2D echo -doppler study     FAMILY HISTORY: Family History  Adopted: Yes  Problem  Relation Age of Onset  . Heart attack Brother     SOCIAL HISTORY:  Social History   Socioeconomic History  . Marital status: Widowed    Spouse name: Not on file  . Number of children: Not on file  . Years of education: 75  . Highest education level: Not on file  Occupational History  . Not on file  Tobacco Use  . Smoking status: Current Every Day Smoker    Packs/day: 0.50    Years: 40.00    Pack years: 20.00    Types: Cigarettes  . Smokeless tobacco: Never Used  . Tobacco comment: 14 cigarette per day  Vaping Use  . Vaping Use: Never used  Substance and Sexual Activity  . Alcohol use: No  . Drug use: No  . Sexual activity: Yes  Other Topics Concern  . Not on file  Social History Narrative   Married 29 years . Has 3 children. Current smoker -1 pack a day-for 36 years .Alcohol :  1 beer on occasion.   She is soon to be a grandmother.  His before to going on a trip up to the [place] to be closer to family when the baby is born.  She is extremely excited.   She very much would like to move back up to that area to be closer to family, but the whether is it just too cold in the winter.   Right handed    Soda daily   Lives alone   Social Determinants of Health   Financial Resource Strain:   . Difficulty of Paying Living Expenses: Not on file  Food Insecurity:   . Worried About Charity fundraiser in the Last Year: Not on file  . Ran Out of Food in the Last Year: Not on file  Transportation Needs:   . Lack of Transportation (Medical): Not on file  . Lack of Transportation (Non-Medical): Not on file  Physical Activity:   . Days of Exercise per Week: Not on file  . Minutes of Exercise per Session: Not on file  Stress:   . Feeling of Stress : Not on file  Social Connections:   . Frequency of Communication with Friends and Family: Not on file  . Frequency of Social Gatherings with Friends and Family: Not on file  . Attends Religious Services: Not on file   . Active Member of Clubs or Organizations: Not on file  . Attends Archivist  Meetings: Not on file  . Marital Status: Not on file  Intimate Partner Violence:   . Fear of Current or Ex-Partner: Not on file  . Emotionally Abused: Not on file  . Physically Abused: Not on file  . Sexually Abused: Not on file     PHYSICAL EXAM  Vitals:   01/04/20 1047  BP: 114/86  Pulse: (!) 116  Height: 5\' 7"  (1.702 m)    Body mass index is 20.44 kg/m.   General: The patient is well-developed and well-nourished and in no acute distress  HEENT:  Head is [place]/AT.  Sclera are anicteric.  Some cerumen butno occulsion of ear canals.  TMI   Neck:The neck is nontender with good range of motion   Skin: Extremities are without rash or  edema.  Musculoskeletal:  Back is mildly tender in the lower lumbar paraspinal region  Neurologic Exam  Mental status: See MoCA scores above.  Of note STM was 3/5 without category prompt but 5/5 with category prompt.  Cranial nerves: Extraocular movements are full. There is good facial sensation to soft touch bilaterally.Facial strength is normal. No obvious hearing deficits are noted.  Motor:  Muscle bulk is normal.   Tone is normal. Strength is  5 / 5 in all 4 extremities except 4+/5 right EHL.     Sensory: Sensory testing showed reduced touch sensation in the right leg (anterolateral lower thigh to shin.   Feet and medial aspect of legs had symmetric sensation.   symmetric vibration in the legs.    Coordination: Cerebellar testing reveals good finger-nose-finger and heel-to-shin bilaterally.  Gait and station: Station is normal.   Gait is mildly wide.  Tandem gait is wide t.  . Romberg is negative.   Reflexes: Deep tendon reflexes are symmetric and normal bilaterally.       DIAGNOSTIC DATA (LABS, IMAGING, TESTING) - I reviewed patient records, labs, notes, testing and imaging myself where available.  Lab Results  Component Value Date   WBC 8.8  12/18/2019   HGB 11.5 (L) 12/18/2019   HCT 36.8 12/18/2019   MCV 90.4 12/18/2019   PLT 276 12/18/2019      Component Value Date/Time   NA 140 12/21/2019 0425   K 3.8 12/21/2019 0425   CL 108 12/21/2019 0425   CO2 26 12/21/2019 0425   GLUCOSE 102 (H) 12/21/2019 0425   BUN 7 (L) 12/21/2019 0425   CREATININE 0.78 12/21/2019 0425   CREATININE 1.08 (H) 09/24/2018 1019   CALCIUM 9.1 12/21/2019 0425   PROT 5.0 (L) 12/19/2019 0336   ALBUMIN 2.6 (L) 12/19/2019 0336   AST 33 12/19/2019 0336   ALT 29 12/19/2019 0336   ALKPHOS 39 12/19/2019 0336   BILITOT 0.9 12/19/2019 0336   GFRNONAA >60 12/21/2019 0425   GFRNONAA 56 (L) 09/24/2018 1019   GFRAA >60 12/21/2019 0425   GFRAA 65 09/24/2018 1019   Lab Results  Component Value Date   CHOL 256 (H) 09/24/2018   HDL 50 09/24/2018   LDLCALC 179 (H) 09/24/2018   TRIG 130 09/24/2018   CHOLHDL 5.1 (H) 09/24/2018       ASSESSMENT AND PLAN  Gait disturbance - Plan: Ambulatory referral to Physical Therapy, Comprehensive metabolic panel, Magnesium  Elevated CK - Plan: CK, Comprehensive metabolic panel  Weakness of both lower extremities - Plan: Ambulatory referral to Physical Therapy, CK, Comprehensive metabolic panel, Magnesium  Hypokalemia  Memory loss  B12 deficiency  1.  Check creatine kinase, and CMP as the CK  was elevated and there was hypokalemia.  Also check magnesium. 2.   Difficulties with gait unlikely due to deconditioning from her recent hospitalization.  I will see if we can get physical therapy to do home health 3.   Stay active and exercise as tolerated. 4.   She will return to see [place] in 6 months or sooner if there are new or worsening neurologic symptoms.  45-minute office visit with the majority of the time spent face-to-face for history and physical, discussion/counseling and decision-making.  Additional time with record review and documentation.   Quincy Boy A. Felecia Shelling, MD, [facility] 63/94/3200, 37:94 AM Certified in  Neurology, Clinical Neurophysiology, Sleep Medicine and Neuroimaging  [facility] [address], [place], [place] [address] [phone]

## 2020-01-05 LAB — COMPREHENSIVE METABOLIC PANEL
ALT: 13 IU/L (ref 0–32)
AST: 13 IU/L (ref 0–40)
Albumin/Globulin Ratio: 2 (ref 1.2–2.2)
Albumin: 4.4 g/dL (ref 3.8–4.8)
Alkaline Phosphatase: 71 IU/L (ref 44–121)
BUN/Creatinine Ratio: 11 — ABNORMAL LOW (ref 12–28)
BUN: 11 mg/dL (ref 8–27)
Bilirubin Total: 1 mg/dL (ref 0.0–1.2)
CO2: 23 mmol/L (ref 20–29)
Calcium: 10.5 mg/dL — ABNORMAL HIGH (ref 8.7–10.3)
Chloride: 99 mmol/L (ref 96–106)
Creatinine, Ser: 1.01 mg/dL — ABNORMAL HIGH (ref 0.57–1.00)
GFR calc Af Amer: 69 mL/min/{1.73_m2} (ref >59)
GFR calc non Af Amer: 60 mL/min/{1.73_m2} (ref >59)
Globulin, Total: 2.2 g/dL (ref 1.5–4.5)
Glucose: 114 mg/dL — ABNORMAL HIGH (ref 65–99)
Potassium: 4.5 mmol/L (ref 3.5–5.2)
Sodium: 139 mmol/L (ref 134–144)
Total Protein: 6.6 g/dL (ref 6.0–8.5)

## 2020-01-05 LAB — MAGNESIUM: Magnesium: 2.3 mg/dL (ref 1.6–2.3)

## 2020-01-05 LAB — CK: Total CK: 54 U/L (ref 32–182)

## 2020-01-08 ENCOUNTER — Telehealth: Payer: Self-pay | Admitting: Neurology

## 2020-01-08 DIAGNOSIS — Z9181 History of falling: Secondary | ICD-10-CM | POA: Diagnosis not present

## 2020-01-08 DIAGNOSIS — Z8541 Personal history of malignant neoplasm of cervix uteri: Secondary | ICD-10-CM | POA: Diagnosis not present

## 2020-01-08 DIAGNOSIS — Z90710 Acquired absence of both cervix and uterus: Secondary | ICD-10-CM | POA: Diagnosis not present

## 2020-01-08 DIAGNOSIS — R69 Illness, unspecified: Secondary | ICD-10-CM | POA: Diagnosis not present

## 2020-01-08 DIAGNOSIS — M5136 Other intervertebral disc degeneration, lumbar region: Secondary | ICD-10-CM | POA: Diagnosis not present

## 2020-01-08 DIAGNOSIS — E538 Deficiency of other specified B group vitamins: Secondary | ICD-10-CM | POA: Diagnosis not present

## 2020-01-08 DIAGNOSIS — M4807 Spinal stenosis, lumbosacral region: Secondary | ICD-10-CM | POA: Diagnosis not present

## 2020-01-08 DIAGNOSIS — Z981 Arthrodesis status: Secondary | ICD-10-CM | POA: Diagnosis not present

## 2020-01-08 DIAGNOSIS — Z950 Presence of cardiac pacemaker: Secondary | ICD-10-CM | POA: Diagnosis not present

## 2020-01-08 NOTE — Telephone Encounter (Signed)
PT Gracie from [facility] called asking for verbal orders for Home Health PT.  Terri Piedra is asking for 1 week 1, 2 times a week for 4 weeks and 1 time a week for 4 weeks

## 2020-01-11 NOTE — Telephone Encounter (Signed)
Called and spoke with Gracie. Provided VO for request below per Dr. Felecia Shelling. She verbalized understanding and appreciation.

## 2020-01-12 DIAGNOSIS — M4807 Spinal stenosis, lumbosacral region: Secondary | ICD-10-CM | POA: Diagnosis not present

## 2020-01-12 DIAGNOSIS — E538 Deficiency of other specified B group vitamins: Secondary | ICD-10-CM | POA: Diagnosis not present

## 2020-01-12 DIAGNOSIS — Z90710 Acquired absence of both cervix and uterus: Secondary | ICD-10-CM | POA: Diagnosis not present

## 2020-01-12 DIAGNOSIS — Z950 Presence of cardiac pacemaker: Secondary | ICD-10-CM | POA: Diagnosis not present

## 2020-01-12 DIAGNOSIS — R69 Illness, unspecified: Secondary | ICD-10-CM | POA: Diagnosis not present

## 2020-01-12 DIAGNOSIS — Z8541 Personal history of malignant neoplasm of cervix uteri: Secondary | ICD-10-CM | POA: Diagnosis not present

## 2020-01-12 DIAGNOSIS — Z9181 History of falling: Secondary | ICD-10-CM | POA: Diagnosis not present

## 2020-01-12 DIAGNOSIS — Z981 Arthrodesis status: Secondary | ICD-10-CM | POA: Diagnosis not present

## 2020-01-14 DIAGNOSIS — Z981 Arthrodesis status: Secondary | ICD-10-CM | POA: Diagnosis not present

## 2020-01-14 DIAGNOSIS — R69 Illness, unspecified: Secondary | ICD-10-CM | POA: Diagnosis not present

## 2020-01-14 DIAGNOSIS — M4807 Spinal stenosis, lumbosacral region: Secondary | ICD-10-CM | POA: Diagnosis not present

## 2020-01-14 DIAGNOSIS — Z9181 History of falling: Secondary | ICD-10-CM | POA: Diagnosis not present

## 2020-01-14 DIAGNOSIS — Z90710 Acquired absence of both cervix and uterus: Secondary | ICD-10-CM | POA: Diagnosis not present

## 2020-01-14 DIAGNOSIS — E538 Deficiency of other specified B group vitamins: Secondary | ICD-10-CM | POA: Diagnosis not present

## 2020-01-14 DIAGNOSIS — Z950 Presence of cardiac pacemaker: Secondary | ICD-10-CM | POA: Diagnosis not present

## 2020-01-14 DIAGNOSIS — Z8541 Personal history of malignant neoplasm of cervix uteri: Secondary | ICD-10-CM | POA: Diagnosis not present

## 2020-01-18 DIAGNOSIS — E538 Deficiency of other specified B group vitamins: Secondary | ICD-10-CM | POA: Diagnosis not present

## 2020-01-18 DIAGNOSIS — Z9181 History of falling: Secondary | ICD-10-CM | POA: Diagnosis not present

## 2020-01-18 DIAGNOSIS — Z90710 Acquired absence of both cervix and uterus: Secondary | ICD-10-CM | POA: Diagnosis not present

## 2020-01-18 DIAGNOSIS — Z950 Presence of cardiac pacemaker: Secondary | ICD-10-CM | POA: Diagnosis not present

## 2020-01-18 DIAGNOSIS — Z8541 Personal history of malignant neoplasm of cervix uteri: Secondary | ICD-10-CM | POA: Diagnosis not present

## 2020-01-18 DIAGNOSIS — M4807 Spinal stenosis, lumbosacral region: Secondary | ICD-10-CM | POA: Diagnosis not present

## 2020-01-18 DIAGNOSIS — R69 Illness, unspecified: Secondary | ICD-10-CM | POA: Diagnosis not present

## 2020-01-18 DIAGNOSIS — Z981 Arthrodesis status: Secondary | ICD-10-CM | POA: Diagnosis not present

## 2020-01-20 DIAGNOSIS — Z8541 Personal history of malignant neoplasm of cervix uteri: Secondary | ICD-10-CM | POA: Diagnosis not present

## 2020-01-20 DIAGNOSIS — Z9181 History of falling: Secondary | ICD-10-CM | POA: Diagnosis not present

## 2020-01-20 DIAGNOSIS — Z981 Arthrodesis status: Secondary | ICD-10-CM | POA: Diagnosis not present

## 2020-01-20 DIAGNOSIS — M4807 Spinal stenosis, lumbosacral region: Secondary | ICD-10-CM | POA: Diagnosis not present

## 2020-01-20 DIAGNOSIS — Z950 Presence of cardiac pacemaker: Secondary | ICD-10-CM | POA: Diagnosis not present

## 2020-01-20 DIAGNOSIS — Z90710 Acquired absence of both cervix and uterus: Secondary | ICD-10-CM | POA: Diagnosis not present

## 2020-01-20 DIAGNOSIS — R69 Illness, unspecified: Secondary | ICD-10-CM | POA: Diagnosis not present

## 2020-01-20 DIAGNOSIS — E538 Deficiency of other specified B group vitamins: Secondary | ICD-10-CM | POA: Diagnosis not present

## 2020-01-22 ENCOUNTER — Other Ambulatory Visit: Payer: Self-pay | Admitting: Physician Assistant

## 2020-01-22 DIAGNOSIS — F411 Generalized anxiety disorder: Secondary | ICD-10-CM

## 2020-01-22 DIAGNOSIS — G4709 Other insomnia: Secondary | ICD-10-CM

## 2020-01-22 DIAGNOSIS — G4700 Insomnia, unspecified: Secondary | ICD-10-CM

## 2020-01-25 DIAGNOSIS — Z981 Arthrodesis status: Secondary | ICD-10-CM | POA: Diagnosis not present

## 2020-01-25 DIAGNOSIS — Z8541 Personal history of malignant neoplasm of cervix uteri: Secondary | ICD-10-CM | POA: Diagnosis not present

## 2020-01-25 DIAGNOSIS — R69 Illness, unspecified: Secondary | ICD-10-CM | POA: Diagnosis not present

## 2020-01-25 DIAGNOSIS — E538 Deficiency of other specified B group vitamins: Secondary | ICD-10-CM | POA: Diagnosis not present

## 2020-01-25 DIAGNOSIS — Z950 Presence of cardiac pacemaker: Secondary | ICD-10-CM | POA: Diagnosis not present

## 2020-01-25 DIAGNOSIS — M4807 Spinal stenosis, lumbosacral region: Secondary | ICD-10-CM | POA: Diagnosis not present

## 2020-01-25 DIAGNOSIS — Z9181 History of falling: Secondary | ICD-10-CM | POA: Diagnosis not present

## 2020-01-25 DIAGNOSIS — Z90710 Acquired absence of both cervix and uterus: Secondary | ICD-10-CM | POA: Diagnosis not present

## 2020-01-26 ENCOUNTER — Other Ambulatory Visit: Payer: Self-pay | Admitting: Physician Assistant

## 2020-01-28 DIAGNOSIS — Z981 Arthrodesis status: Secondary | ICD-10-CM | POA: Diagnosis not present

## 2020-01-28 DIAGNOSIS — Z9181 History of falling: Secondary | ICD-10-CM | POA: Diagnosis not present

## 2020-01-28 DIAGNOSIS — Z90710 Acquired absence of both cervix and uterus: Secondary | ICD-10-CM | POA: Diagnosis not present

## 2020-01-28 DIAGNOSIS — Z950 Presence of cardiac pacemaker: Secondary | ICD-10-CM | POA: Diagnosis not present

## 2020-01-28 DIAGNOSIS — M4807 Spinal stenosis, lumbosacral region: Secondary | ICD-10-CM | POA: Diagnosis not present

## 2020-01-28 DIAGNOSIS — E538 Deficiency of other specified B group vitamins: Secondary | ICD-10-CM | POA: Diagnosis not present

## 2020-01-28 DIAGNOSIS — Z8541 Personal history of malignant neoplasm of cervix uteri: Secondary | ICD-10-CM | POA: Diagnosis not present

## 2020-01-28 DIAGNOSIS — R69 Illness, unspecified: Secondary | ICD-10-CM | POA: Diagnosis not present

## 2020-01-29 ENCOUNTER — Telehealth (INDEPENDENT_AMBULATORY_CARE_PROVIDER_SITE_OTHER): Payer: Medicare HMO | Admitting: Physician Assistant

## 2020-01-29 ENCOUNTER — Encounter: Payer: Self-pay | Admitting: Physician Assistant

## 2020-01-29 DIAGNOSIS — Z9289 Personal history of other medical treatment: Secondary | ICD-10-CM

## 2020-01-29 DIAGNOSIS — F411 Generalized anxiety disorder: Secondary | ICD-10-CM | POA: Diagnosis not present

## 2020-01-29 DIAGNOSIS — F39 Unspecified mood [affective] disorder: Secondary | ICD-10-CM | POA: Diagnosis not present

## 2020-01-29 DIAGNOSIS — F17200 Nicotine dependence, unspecified, uncomplicated: Secondary | ICD-10-CM

## 2020-01-29 DIAGNOSIS — G4700 Insomnia, unspecified: Secondary | ICD-10-CM | POA: Diagnosis not present

## 2020-01-29 DIAGNOSIS — G4709 Other insomnia: Secondary | ICD-10-CM

## 2020-01-29 DIAGNOSIS — R69 Illness, unspecified: Secondary | ICD-10-CM | POA: Diagnosis not present

## 2020-01-29 MED ORDER — RISPERIDONE 3 MG PO TABS: 3.0000 mg | ORAL_TABLET | Freq: Every day | ORAL | 1 refills | Status: DC

## 2020-01-29 MED ORDER — ALPRAZOLAM 1 MG PO TABS: ORAL_TABLET | ORAL | 1 refills | Status: DC

## 2020-01-29 NOTE — Progress Notes (Signed)
[facility]  Patient ID: [name],  MRN: [id number]  PCP: Lujean Amel, MD  Date of Evaluation: 01/29/2020  time spent:40 minutes  Chief Complaint:  Chief Complaint    Anxiety; Depression; Insomnia     Virtual Visit via Telehealth  I connected with patient by a video enabled telemedicine application with their informed consent, and verified patient privacy and that I am speaking with the correct person using two identifiers.  I am private, in my office and the patient is at home.  I discussed the limitations, risks, security and privacy concerns of performing an evaluation and management service by video and the availability of in person appointments. I also discussed with the patient that there may be a patient responsible charge related to this service. The patient expressed understanding and agreed to proceed.   I discussed the assessment and treatment plan with the patient. The patient was provided an opportunity to ask questions and all were answered. The patient agreed with the plan and demonstrated an understanding of the instructions.   The patient was advised to call back or seek an in-person evaluation if the symptoms worsen or if the condition fails to improve as anticipated.  I provided 40 minutes of non-face-to-face time during this encounter.   HISTORY/CURRENT STATUS: HPI her daughter [name] is on the video call with her.  Was in hospital for septic pneumonia 10/1 for 4 days. In home rehab now, staying with her mother.  Patient states she is not able to walk now and is very weak.  She has had no falls though.    As far as her mental health is concerned, states she is doing well.  Feels like the Risperdal and Xanax are helpful.  States that she was taken off the Zoloft as an inpatient and states she is doing fine without it.  Due to her physical health, she is not able to do much and her energy is very low.  Not feel depressed though she does not cry  easily.  No suicidal or homicidal thoughts.  No manic symptoms of increased energy, increased talkativeness, grandiosity, increased spending, risky or impulsive behaviors,  hallucinations are reported  States she is weak in general but denies dizziness, headache, blurred vision.  No chest pain, palpitations or shortness of breath.  No abdominal pain, nausea or vomiting, constipation or diarrhea.  No urinary urgency or hesitancy, no dysuria or frequency.  Does have muscle weakness and is doing the exercises the home health PT recommends.   Individual Medical History/ Review of Systems: Changes? :Yes   See HPI and records on chart.   Past medications for mental health diagnoses include: Trazodone, Belsomra, Risperdal, Zoloft, hydroxyzine, mirtazapine, Xanax, Valium, Wellbutrin, Abilify, gabapentin, Strattera, Vyvanse, Ambien, Lunesta, Dayvigo didn't help, Latuda  Allergies: Penicillins  Current Medications:  Current Outpatient Medications:  .  [employer]S B-12 500 MCG tablet, Take 500 mcg by mouth daily., Disp: , Rfl:  .  risperiDONE (RISPERDAL) 3 MG tablet, Take 1 tablet (3 mg total) by mouth at bedtime., Disp: 90 tablet, Rfl: 1 .  ALPRAZolam (XANAX) 1 MG tablet, 1 po bid prn and occas extra 1 po mid-day., Disp: 75 tablet, Rfl: 1 .  cyclobenzaprine (FLEXERIL) 10 MG tablet, Take 10 mg by mouth at bedtime. (Patient not taking: Reported on 01/29/2020), Disp: , Rfl:  .  HYDROcodone-acetaminophen (NORCO) 10-325 MG tablet, Take 1 tablet by mouth every 6 (six) hours as needed. (Patient not taking: Reported on 01/29/2020), Disp: , Rfl:  Medication Side Effects: none  Family Medical/ Social History: Changes? No  MENTAL HEALTH EXAM:  There were no vitals taken for this visit.There is no height or weight on file to calculate BMI.  General Appearance: Casual  Eye Contact:  Good  Speech:  Clear and Coherent and Slow  Volume:  Normal  Mood:  Euthymic  Affect:  Flat  Thought Process:  Goal Directed and  Descriptions of Associations: Intact  Orientation:  Full (Time, Place, and Person)  Thought Content: Logical   Suicidal Thoughts:  No  Homicidal Thoughts:  No  Memory:  WNL  Judgement:  Good  Insight:  Fair  Psychomotor Activity:  Difficult to tell from the head up on the video.  Concentration:  Concentration: Good and Attention Span: Good  Recall:  Good  Fund of Knowledge: Good  Language: Good  Assets:  Desire for Improvement  ADL's:  Intact  Cognition: WNL  Prognosis:  Poor   Most recent pertinent labs: 12/18/2019  B12 level 146 (low) Glucose was 90  DIAGNOSES:    ICD-10-CM   1. Episodic mood disorder (HCC)  F39   2. Other insomnia  G47.09 risperiDONE (RISPERDAL) 3 MG tablet  3. Generalized anxiety disorder  F41.1 risperiDONE (RISPERDAL) 3 MG tablet  4. Insomnia, unspecified type  G47.00 risperiDONE (RISPERDAL) 3 MG tablet  5. Smoker  F17.200   6. History of recent hospitalization  Z92.89     Receiving Psychotherapy: No    RECOMMENDATIONS:  PDMP was reviewed. I provided 40 minutes of nonface-to-face time during this encounter, including review of hospitalization 12/18/2019 to 12/21/2019.   Discharge medications were to include BuSpar and Zoloft, which she was on prior to the hospitalization, along with the Xanax and Risperdal.  I am not sure why she and her daughter feel that she was told to stop those.  At any rate, she has been off for over a month and is stable mentally so I will not restart them. It was strongly recommended that she get off sedating medications.  The Xanax was decreased and she was told to take 1/2 pill of the 1 mg up to 3 times a day.  She has been taking 2 pills a day and sometimes 3.  We discussed the fact that we need to continue to wean.  She is aware that Xanax can cause falls, however we cannot wean quickly because she has been on benzodiazepines for years.  I will decrease the quantity of her prescription this month and plan to continue decreasing  each month. Continue Risperdal 3 mg, 1 p.o. nightly. Continue Xanax 1 mg to 1 p.o. twice daily and one half p.o. during midday as needed anxiety.  She and her daughter understand that this is no more than 2.5 pills/day. Return in 6-8 weeks.  Donnal Moat, PA-C

## 2020-02-01 DIAGNOSIS — E538 Deficiency of other specified B group vitamins: Secondary | ICD-10-CM | POA: Diagnosis not present

## 2020-02-01 DIAGNOSIS — Z8541 Personal history of malignant neoplasm of cervix uteri: Secondary | ICD-10-CM | POA: Diagnosis not present

## 2020-02-01 DIAGNOSIS — Z950 Presence of cardiac pacemaker: Secondary | ICD-10-CM | POA: Diagnosis not present

## 2020-02-01 DIAGNOSIS — M4807 Spinal stenosis, lumbosacral region: Secondary | ICD-10-CM | POA: Diagnosis not present

## 2020-02-01 DIAGNOSIS — Z981 Arthrodesis status: Secondary | ICD-10-CM | POA: Diagnosis not present

## 2020-02-01 DIAGNOSIS — Z9181 History of falling: Secondary | ICD-10-CM | POA: Diagnosis not present

## 2020-02-01 DIAGNOSIS — R69 Illness, unspecified: Secondary | ICD-10-CM | POA: Diagnosis not present

## 2020-02-01 DIAGNOSIS — Z90710 Acquired absence of both cervix and uterus: Secondary | ICD-10-CM | POA: Diagnosis not present

## 2020-02-04 ENCOUNTER — Telehealth: Payer: Self-pay | Admitting: Neurology

## 2020-02-04 DIAGNOSIS — Z950 Presence of cardiac pacemaker: Secondary | ICD-10-CM | POA: Diagnosis not present

## 2020-02-04 DIAGNOSIS — Z9181 History of falling: Secondary | ICD-10-CM | POA: Diagnosis not present

## 2020-02-04 DIAGNOSIS — Z90710 Acquired absence of both cervix and uterus: Secondary | ICD-10-CM | POA: Diagnosis not present

## 2020-02-04 DIAGNOSIS — Z981 Arthrodesis status: Secondary | ICD-10-CM | POA: Diagnosis not present

## 2020-02-04 DIAGNOSIS — R69 Illness, unspecified: Secondary | ICD-10-CM | POA: Diagnosis not present

## 2020-02-04 DIAGNOSIS — E538 Deficiency of other specified B group vitamins: Secondary | ICD-10-CM | POA: Diagnosis not present

## 2020-02-04 DIAGNOSIS — M4807 Spinal stenosis, lumbosacral region: Secondary | ICD-10-CM | POA: Diagnosis not present

## 2020-02-04 DIAGNOSIS — Z8541 Personal history of malignant neoplasm of cervix uteri: Secondary | ICD-10-CM | POA: Diagnosis not present

## 2020-02-04 NOTE — Telephone Encounter (Signed)
Called pt back. She thought Dr. Felecia Shelling wanted her to f/u in 2 weeks. She reports she has done 4 weeks of physical therapy and has not improved. She would like sooner f/u with Dr. Felecia Shelling. Scheduled her for 02/08/20 at 1:30pm. Advised her to check in at 1:00pm. She verbalized understanding.

## 2020-02-04 NOTE — Telephone Encounter (Signed)
Pt. would like for RN to call her regarding being seen earlier as she states Dr. told her to do so.

## 2020-02-06 DIAGNOSIS — J439 Emphysema, unspecified: Secondary | ICD-10-CM

## 2020-02-07 DIAGNOSIS — Z90710 Acquired absence of both cervix and uterus: Secondary | ICD-10-CM | POA: Diagnosis not present

## 2020-02-07 DIAGNOSIS — R69 Illness, unspecified: Secondary | ICD-10-CM | POA: Diagnosis not present

## 2020-02-07 DIAGNOSIS — Z8541 Personal history of malignant neoplasm of cervix uteri: Secondary | ICD-10-CM | POA: Diagnosis not present

## 2020-02-07 DIAGNOSIS — E538 Deficiency of other specified B group vitamins: Secondary | ICD-10-CM | POA: Diagnosis not present

## 2020-02-07 DIAGNOSIS — Z950 Presence of cardiac pacemaker: Secondary | ICD-10-CM | POA: Diagnosis not present

## 2020-02-07 DIAGNOSIS — Z981 Arthrodesis status: Secondary | ICD-10-CM | POA: Diagnosis not present

## 2020-02-07 DIAGNOSIS — M4807 Spinal stenosis, lumbosacral region: Secondary | ICD-10-CM | POA: Diagnosis not present

## 2020-02-07 DIAGNOSIS — Z9181 History of falling: Secondary | ICD-10-CM | POA: Diagnosis not present

## 2020-02-08 ENCOUNTER — Encounter: Payer: Self-pay | Admitting: Neurology

## 2020-02-08 ENCOUNTER — Ambulatory Visit: Payer: Medicare HMO | Admitting: Neurology

## 2020-02-08 VITALS — BP 99/60 | HR 116 | Ht 67.0 in | Wt 130.0 lb

## 2020-02-08 DIAGNOSIS — R269 Unspecified abnormalities of gait and mobility: Secondary | ICD-10-CM | POA: Diagnosis not present

## 2020-02-08 DIAGNOSIS — G629 Polyneuropathy, unspecified: Secondary | ICD-10-CM

## 2020-02-08 DIAGNOSIS — R1310 Dysphagia, unspecified: Secondary | ICD-10-CM | POA: Diagnosis not present

## 2020-02-08 DIAGNOSIS — E538 Deficiency of other specified B group vitamins: Secondary | ICD-10-CM

## 2020-02-08 DIAGNOSIS — M6281 Muscle weakness (generalized): Secondary | ICD-10-CM | POA: Diagnosis not present

## 2020-02-08 NOTE — Progress Notes (Signed)
[facility]  PATIENT: [name] DOB: [birth year]  REFERRING DOCTOR OR PCP: Dr. Lujean Amel SOURCE: Patient, notes from Dr. Rolena Infante, imaging reports and lumbar MRI and CT images personally reviewed.   _________________________________   HISTORICAL  CHIEF COMPLAINT:  Chief Complaint  Patient presents with  . Follow-up    RM 12. Last seen 01/04/20.In [facility] in office today. Having a lot pain. Since starting PT ,leg issues have worsened. Went to hospital recently for Pneumonia. Doing home PT. Has no flexibility. Try to move, unable to assist with movements. She is currently staying with her mother.     HISTORY OF PRESENT ILLNESS:  [name] is a 64 y.o. woman with reduced memory and poor gait.  02/08/2020: She is feeling weaker since pneumonia late September 2021.   She notes her legs are even weaker than a few weeks ago.  She feels her legs are not getting any better despite doing in home PT.    She denies muscle twitching.   Besides the leg weakness, she also has arm weakness.    Additionally, she is having more trouble with swallowing.   She is coughing after she eats and drinks.   She is eating less though her weight is about the same.   She denies any diplopia or ptosis.      At baseline ,prior to her hospitalization, she was independent but her family helps her with some tasks.   She would walk to a gas station next to her apartment and do one flight of stairs.   Due to back pain, she has been less active but even when pain improved she did not do more activity.   4, she has generally done less for herself this year than last year.  Labs also showed low B12 (146 with 180-914 normal).  CK was elevated at 836 (<234 is normal) while hospitalized but was normal when we checked at the last visit.    TSH, ammonia, lactic acid was normal.   She is sleeping 8 hours sleep most nights.     Imaging  CT angiogram of the head and neck 12/15/2019 was normal  CT scan  of the head 05/26/2019: There is mild cortical atrophy, most pronounced in the parietal lobes.  Brain parenchyma appeared normal.  There were no acute findings.  CT scan lumbar spine 01/06/2019 showed prior fusion and posterior decompression at the L4-5 level with incorporation within the intervertebral disc spacer, however minimal incorporation across the remaining endplates at this level. Hardware is intact without evidence of loosening.   Probable mild canal stenosis at the L3-4 level.  L4-5 far lateral disc material appears to contact the exited left L4 nerve root. . Central disc protrusion at L5-S1 without evidence of canal stenosis.  MRI of the lumbar spine 09/11/2005 showed mild spinal stenosis at L4-L5 due to disc bulging, facet hypertrophy and ligamenta flava hypertrophy.  There was lateral recess stenosis.  L5-S1 showed facet hypertrophy but no spinal stenosis or foraminal narrowing.  Other levels appeared normal.  REVIEW OF SYSTEMS: Constitutional: No fevers, chills, sweats, or change in appetite Eyes: No visual changes, double vision, eye pain Ear, nose and throat: No hearing loss, ear pain, nasal congestion, sore throat Cardiovascular: No chest pain, palpitations Respiratory: No shortness of breath at rest or with exertion.   No wheezes GastrointestinaI: No nausea, vomiting, diarrhea, abdominal pain, fecal incontinence Genitourinary: No dysuria, urinary retention or frequency.  No nocturia. Musculoskeletal:As above  integumentary: No rash,  pruritus, skin lesions Neurological: as above Psychiatric: No depression at this time.  No anxiety Endocrine: No palpitations, diaphoresis, change in appetite, change in weigh or increased thirst Hematologic/Lymphatic: No anemia, purpura, petechiae. Allergic/Immunologic: No itchy/runny eyes, nasal congestion, recent allergic reactions, rashes  ALLERGIES: Allergies  Allergen Reactions  . Penicillins Other (See Comments)    Yeast  infection Did it involve swelling of the face/tongue/throat, SOB, or low BP? No Did it involve sudden or severe rash/hives, skin peeling, or any reaction on the inside of your mouth or nose? No Did you need to seek medical attention at a hospital or doctor's office? Yes When did it last happen? More than 5 years ago If all above answers are "NO", may proceed with cephalosporin use.     HOME MEDICATIONS:  Current Outpatient Medications:  .  ALPRAZolam (XANAX) 1 MG tablet, 1 po bid prn and occas extra 1 po mid-day., Disp: 75 tablet, Rfl: 1 .  [employer] B-12 500 MCG tablet, Take 500 mcg by mouth daily., Disp: , Rfl:  .  cyclobenzaprine (FLEXERIL) 10 MG tablet, Take 10 mg by mouth at bedtime. , Disp: , Rfl:  .  risperiDONE (RISPERDAL) 3 MG tablet, Take 1 tablet (3 mg total) by mouth at bedtime., Disp: 90 tablet, Rfl: 1  PAST MEDICAL HISTORY: Past Medical History:  Diagnosis Date  . Anxiety    anxiety and mild depressive order systoms  . Bipolar disorder ([place])     (02/20/2013)  . Cervical cancer ([place]) 03/19/1980  . Depression   . Dysrhythmia   . WCHENIDP(824.2)    "monthly" (02/20/2013)  . Pacemaker    medtronic  . PONV (postoperative nausea and vomiting)   . Sinus pause 02/14/2013  . Syncope and collapse    echo-April 12,2012-EF 55% Echo normal; recorder with prolonged sinus pauses --> status post  . Tobacco abuse   . Vertigo     PAST SURGICAL HISTORY: Past Surgical History:  Procedure Laterality Date  . ABDOMINAL HYSTERECTOMY  03/19/1980   "partial"  . BACK SURGERY    . CERVICAL FUSION Left 11/2013   C4-C6  . CERVICAL FUSION     3,4,5,  . INSERT / REPLACE / REMOVE PACEMAKER  02/20/2013   medtronic  . LOOP RECORDER IMPLANT N/A 11/06/2012   Procedure: LINQ LOOP RECORDER IMPLANT;  Surgeon: Sanda Klein, MD;  Location: [facility] CATH [facility];  Service: Cardiovascular;  Laterality: N/A;  . NECK SURGERY  11/17/2013  . PACEMAKER PLACEMENT N/A 02/16/2013  . PERMANENT PACEMAKER INSERTION N/A  02/20/2013   Procedure: PERMANENT PACEMAKER INSERTION;  Surgeon: Sanda Klein, MD;  Location: [facility] CATH [facility];  Service: Cardiovascular;  Laterality: N/A;  . POSTERIOR LUMBAR FUSION  ~ 2009  . ROBOTIC ASSISTED SALPINGO OOPHERECTOMY Bilateral 11/25/2018   Procedure: XI ROBOTIC ASSISTED BILATERAL SALPINGO OOPHORECTOMY and LYSIS OF ADHESIONS.;  Surgeon: Everitt Amber, MD;  Location: [facility] ORS;  Service: Gynecology;  Laterality: Bilateral;  . SPINAL FUSION  03/19/2014  . TONSILLECTOMY    . TRANSTHORACIC ECHOCARDIOGRAM  07/11/2010   The left atrial size is normal.There is no evidence of mitral vavle prolaspe.Right ventriicular systolic pressure is normal . Injection of contrast documented no interatrial shunt. Essentially normal 2D echo -doppler study     FAMILY HISTORY: Family History  Adopted: Yes  Problem Relation Age of Onset  . Heart attack Brother     SOCIAL HISTORY:  Social History   Socioeconomic History  . Marital status: Widowed    Spouse name: Not on file  .  Number of children: Not on file  . Years of education: 97  . Highest education level: Not on file  Occupational History  . Not on file  Tobacco Use  . Smoking status: Current Every Day Smoker    Packs/day: 0.50    Years: 40.00    Pack years: 20.00    Types: Cigarettes  . Smokeless tobacco: Never Used  . Tobacco comment: 14 cigarette per day  Vaping Use  . Vaping Use: Never used  Substance and Sexual Activity  . Alcohol use: No  . Drug use: No  . Sexual activity: Yes  Other Topics Concern  . Not on file  Social History Narrative   Married 29 years . Has 3 children. Current smoker -1 pack a day-for 36 years .Alcohol :  1 beer on occasion.   She is soon to be a grandmother.  His before to going on a trip up to the [place] to be closer to family when the baby is born.  She is extremely excited.   She very much would like to move back up to that area to be closer to family, but the whether is it just too  cold in the winter.   Right handed    Soda daily   Lives alone   Social Determinants of Health   Financial Resource Strain:   . Difficulty of Paying Living Expenses: Not on file  Food Insecurity:   . Worried About Charity fundraiser in the Last Year: Not on file  . Ran Out of Food in the Last Year: Not on file  Transportation Needs:   . Lack of Transportation (Medical): Not on file  . Lack of Transportation (Non-Medical): Not on file  Physical Activity:   . Days of Exercise per Week: Not on file  . Minutes of Exercise per Session: Not on file  Stress:   . Feeling of Stress : Not on file  Social Connections:   . Frequency of Communication with Friends and Family: Not on file  . Frequency of Social Gatherings with Friends and Family: Not on file  . Attends Religious Services: Not on file  . Active Member of Clubs or Organizations: Not on file  . Attends Archivist Meetings: Not on file  . Marital Status: Not on file  Intimate Partner Violence:   . Fear of Current or Ex-Partner: Not on file  . Emotionally Abused: Not on file  . Physically Abused: Not on file  . Sexually Abused: Not on file     PHYSICAL EXAM  Vitals:   02/08/20 1355  BP: 99/60  Pulse: (!) 116  SpO2: 98%  Weight: 130 lb (59 kg)  Height: 5\' 7"  (1.702 m)    Body mass index is 20.36 kg/m.   General: The patient is well-developed and well-nourished and in no acute distress  HEENT:  Head is /AT.  Sclera are anicteric.  Neck:The neck is nontender with good range of motion   Skin: Extremities are without rash or  edema.   Neurologic Exam  Mental status: Alert and oriented.  Reduced focus.   Cranial nerves: Extraocular movements are full. There is good facial sensation to soft touch bilaterally.Facial strength is normal. No obvious hearing deficits are noted.  Motor:  Muscle bulk is normal.   Tone is normal. Strength is  5 / 5 in all 4 extremities except 4+/5 right EHL.     Sensory:  Sensory testing showed reduced touch sensation  in the right leg (anterolateral lower thigh to shin.   Feet and medial aspect of legs had symmetric sensation.  She has reduced vibration sensation in toes relative to ankles/knees.  Ankles were just minimally reduced compared to knees.  Coordination: Cerebellar testing reveals good finger-nose-finger and heel-to-shin bilaterally.  Gait and station: Station is normal but she needs to use her arms to stand up from the chair.  She feels weak when standing and needs unilateral support to take steps.. Romberg is negative.   Reflexes: Deep tendon reflexes are symmetric and normal bilaterally.       DIAGNOSTIC DATA (LABS, IMAGING, TESTING) - I reviewed patient records, labs, notes, testing and imaging myself where available.  Lab Results  Component Value Date   WBC 8.8 12/18/2019   HGB 11.5 (L) 12/18/2019   HCT 36.8 12/18/2019   MCV 90.4 12/18/2019   PLT 276 12/18/2019      Component Value Date/Time   NA 139 01/04/2020 1147   K 4.5 01/04/2020 1147   CL 99 01/04/2020 1147   CO2 23 01/04/2020 1147   GLUCOSE 114 (H) 01/04/2020 1147   GLUCOSE 102 (H) 12/21/2019 0425   BUN 11 01/04/2020 1147   CREATININE 1.01 (H) 01/04/2020 1147   CREATININE 1.08 (H) 09/24/2018 1019   CALCIUM 10.5 (H) 01/04/2020 1147   PROT 6.6 01/04/2020 1147   ALBUMIN 4.4 01/04/2020 1147   AST 13 01/04/2020 1147   ALT 13 01/04/2020 1147   ALKPHOS 71 01/04/2020 1147   BILITOT 1.0 01/04/2020 1147   GFRNONAA 60 01/04/2020 1147   GFRNONAA 56 (L) 09/24/2018 1019   GFRAA 69 01/04/2020 1147   GFRAA 65 09/24/2018 1019   Lab Results  Component Value Date   CHOL 256 (H) 09/24/2018   HDL 50 09/24/2018   LDLCALC 179 (H) 09/24/2018   TRIG 130 09/24/2018   CHOLHDL 5.1 (H) 09/24/2018       ASSESSMENT AND PLAN  Proximal muscle weakness  Dysphagia, unspecified type  Gait disturbance  1.  She is weaker proximally and having difficulty woth swallow function.    Etiology is unclear.  Because she has proximal more than distal weakness and is having some trouble with swallowing, we need to rule out myasthenia gravis.  Will check MG labs, TSH/T4 of all labs are normal and she does not start to improve consider NCV/EMG as well 2. Due to poor swallowing we will check SLP eval and swallow study. 3.   Stay active and exercise as tolerated. 4.   She will return to see [place] in 6 months or sooner if there are new or worsening neurologic symptoms.    Humaira Sculley A. Felecia Shelling, MD, [facility] [id number]/[id number], 5:99 PM Certified in Neurology, Clinical Neurophysiology, Sleep Medicine and Neuroimaging  [facility] [address], [place],  [address] [phone]

## 2020-02-09 DIAGNOSIS — E538 Deficiency of other specified B group vitamins: Secondary | ICD-10-CM | POA: Diagnosis not present

## 2020-02-09 DIAGNOSIS — Z981 Arthrodesis status: Secondary | ICD-10-CM | POA: Diagnosis not present

## 2020-02-09 DIAGNOSIS — R69 Illness, unspecified: Secondary | ICD-10-CM | POA: Diagnosis not present

## 2020-02-09 DIAGNOSIS — M4807 Spinal stenosis, lumbosacral region: Secondary | ICD-10-CM | POA: Diagnosis not present

## 2020-02-09 DIAGNOSIS — Z9181 History of falling: Secondary | ICD-10-CM | POA: Diagnosis not present

## 2020-02-09 DIAGNOSIS — Z950 Presence of cardiac pacemaker: Secondary | ICD-10-CM | POA: Diagnosis not present

## 2020-02-09 DIAGNOSIS — Z8541 Personal history of malignant neoplasm of cervix uteri: Secondary | ICD-10-CM | POA: Diagnosis not present

## 2020-02-09 DIAGNOSIS — Z90710 Acquired absence of both cervix and uterus: Secondary | ICD-10-CM | POA: Diagnosis not present

## 2020-02-12 LAB — THYROID PANEL WITH TSH
Free Thyroxine Index: 2.4 (ref 1.2–4.9)
T3 Uptake Ratio: 27 % (ref 24–39)
T4, Total: 8.9 ug/dL (ref 4.5–12.0)
TSH: 1.25 u[IU]/mL (ref 0.450–4.500)

## 2020-02-12 LAB — ACETYLCHOLINE RECEPTOR, BLOCKING: Acetylchol Block Ab: 16 % (ref 0–25)

## 2020-02-12 LAB — ACETYLCHOLINE RECEPTOR, BINDING: AChR Binding Ab, Serum: <0.03 nmol/L (ref 0.00–0.24)

## 2020-02-12 LAB — ACHR RECEPTOR MODULATING AB: Acetylcholine Modulating Ab: 0 % (ref <=45)

## 2020-02-12 LAB — VITAMIN B12: Vitamin B-12: 1153 pg/mL (ref 232–1245)

## 2020-02-15 ENCOUNTER — Other Ambulatory Visit: Payer: Self-pay | Admitting: Physician Assistant

## 2020-02-17 DIAGNOSIS — Z981 Arthrodesis status: Secondary | ICD-10-CM | POA: Diagnosis not present

## 2020-02-17 DIAGNOSIS — Z9181 History of falling: Secondary | ICD-10-CM | POA: Diagnosis not present

## 2020-02-17 DIAGNOSIS — Z950 Presence of cardiac pacemaker: Secondary | ICD-10-CM | POA: Diagnosis not present

## 2020-02-17 DIAGNOSIS — M4807 Spinal stenosis, lumbosacral region: Secondary | ICD-10-CM | POA: Diagnosis not present

## 2020-02-17 DIAGNOSIS — R69 Illness, unspecified: Secondary | ICD-10-CM | POA: Diagnosis not present

## 2020-02-17 DIAGNOSIS — Z90710 Acquired absence of both cervix and uterus: Secondary | ICD-10-CM | POA: Diagnosis not present

## 2020-02-17 DIAGNOSIS — E538 Deficiency of other specified B group vitamins: Secondary | ICD-10-CM | POA: Diagnosis not present

## 2020-02-17 DIAGNOSIS — Z8541 Personal history of malignant neoplasm of cervix uteri: Secondary | ICD-10-CM | POA: Diagnosis not present

## 2020-02-19 ENCOUNTER — Ambulatory Visit (HOSPITAL_COMMUNITY): Payer: Medicare HMO

## 2020-02-19 ENCOUNTER — Encounter (HOSPITAL_COMMUNITY): Payer: Medicare HMO

## 2020-02-21 ENCOUNTER — Encounter (HOSPITAL_COMMUNITY): Payer: Self-pay | Admitting: Internal Medicine

## 2020-02-21 ENCOUNTER — Emergency Department (HOSPITAL_COMMUNITY): Payer: Medicare HMO

## 2020-02-21 ENCOUNTER — Inpatient Hospital Stay (HOSPITAL_COMMUNITY)
Admission: EM | Admit: 2020-02-21 | Discharge: 2020-02-27 | DRG: 092 | Disposition: A | Discharge status: Skilled Nursing Facility | Payer: Medicare HMO | Attending: Internal Medicine | Admitting: Internal Medicine

## 2020-02-21 DIAGNOSIS — R059 Cough, unspecified: Secondary | ICD-10-CM | POA: Diagnosis not present

## 2020-02-21 DIAGNOSIS — M5126 Other intervertebral disc displacement, lumbar region: Secondary | ICD-10-CM | POA: Diagnosis not present

## 2020-02-21 DIAGNOSIS — J9811 Atelectasis: Secondary | ICD-10-CM | POA: Diagnosis present

## 2020-02-21 DIAGNOSIS — M2550 Pain in unspecified joint: Secondary | ICD-10-CM | POA: Diagnosis not present

## 2020-02-21 DIAGNOSIS — Z7401 Bed confinement status: Secondary | ICD-10-CM

## 2020-02-21 DIAGNOSIS — G959 Disease of spinal cord, unspecified: Secondary | ICD-10-CM | POA: Diagnosis not present

## 2020-02-21 DIAGNOSIS — D519 Vitamin B12 deficiency anemia, unspecified: Secondary | ICD-10-CM | POA: Diagnosis not present

## 2020-02-21 DIAGNOSIS — R262 Difficulty in walking, not elsewhere classified: Secondary | ICD-10-CM | POA: Diagnosis present

## 2020-02-21 DIAGNOSIS — E860 Dehydration: Secondary | ICD-10-CM | POA: Diagnosis not present

## 2020-02-21 DIAGNOSIS — R5381 Other malaise: Secondary | ICD-10-CM | POA: Diagnosis not present

## 2020-02-21 DIAGNOSIS — R1310 Dysphagia, unspecified: Secondary | ICD-10-CM | POA: Diagnosis present

## 2020-02-21 DIAGNOSIS — M545 Low back pain, unspecified: Secondary | ICD-10-CM | POA: Diagnosis not present

## 2020-02-21 DIAGNOSIS — Z720 Tobacco use: Secondary | ICD-10-CM

## 2020-02-21 DIAGNOSIS — Z8541 Personal history of malignant neoplasm of cervix uteri: Secondary | ICD-10-CM | POA: Diagnosis not present

## 2020-02-21 DIAGNOSIS — Z981 Arthrodesis status: Secondary | ICD-10-CM

## 2020-02-21 DIAGNOSIS — R2689 Other abnormalities of gait and mobility: Secondary | ICD-10-CM | POA: Diagnosis not present

## 2020-02-21 DIAGNOSIS — G8929 Other chronic pain: Secondary | ICD-10-CM | POA: Diagnosis present

## 2020-02-21 DIAGNOSIS — Z90710 Acquired absence of both cervix and uterus: Secondary | ICD-10-CM

## 2020-02-21 DIAGNOSIS — R531 Weakness: Secondary | ICD-10-CM | POA: Diagnosis not present

## 2020-02-21 DIAGNOSIS — J189 Pneumonia, unspecified organism: Secondary | ICD-10-CM | POA: Diagnosis not present

## 2020-02-21 DIAGNOSIS — M6281 Muscle weakness (generalized): Secondary | ICD-10-CM | POA: Diagnosis not present

## 2020-02-21 DIAGNOSIS — Z8701 Personal history of pneumonia (recurrent): Secondary | ICD-10-CM

## 2020-02-21 DIAGNOSIS — Z8249 Family history of ischemic heart disease and other diseases of the circulatory system: Secondary | ICD-10-CM

## 2020-02-21 DIAGNOSIS — W19XXXA Unspecified fall, initial encounter: Secondary | ICD-10-CM | POA: Diagnosis not present

## 2020-02-21 DIAGNOSIS — Z880 Allergy status to penicillin: Secondary | ICD-10-CM

## 2020-02-21 DIAGNOSIS — M4322 Fusion of spine, cervical region: Secondary | ICD-10-CM | POA: Diagnosis not present

## 2020-02-21 DIAGNOSIS — E440 Moderate protein-calorie malnutrition: Secondary | ICD-10-CM | POA: Diagnosis not present

## 2020-02-21 DIAGNOSIS — R29898 Other symptoms and signs involving the musculoskeletal system: Secondary | ICD-10-CM | POA: Diagnosis not present

## 2020-02-21 DIAGNOSIS — E538 Deficiency of other specified B group vitamins: Secondary | ICD-10-CM | POA: Diagnosis present

## 2020-02-21 DIAGNOSIS — Z950 Presence of cardiac pacemaker: Secondary | ICD-10-CM

## 2020-02-21 DIAGNOSIS — F17210 Nicotine dependence, cigarettes, uncomplicated: Secondary | ICD-10-CM | POA: Diagnosis present

## 2020-02-21 DIAGNOSIS — F411 Generalized anxiety disorder: Secondary | ICD-10-CM | POA: Diagnosis present

## 2020-02-21 DIAGNOSIS — M549 Dorsalgia, unspecified: Secondary | ICD-10-CM | POA: Diagnosis present

## 2020-02-21 DIAGNOSIS — I499 Cardiac arrhythmia, unspecified: Secondary | ICD-10-CM | POA: Diagnosis present

## 2020-02-21 DIAGNOSIS — R69 Illness, unspecified: Secondary | ICD-10-CM | POA: Diagnosis not present

## 2020-02-21 DIAGNOSIS — M4326 Fusion of spine, lumbar region: Secondary | ICD-10-CM | POA: Diagnosis not present

## 2020-02-21 DIAGNOSIS — R000 Tachycardia, unspecified: Secondary | ICD-10-CM | POA: Diagnosis not present

## 2020-02-21 DIAGNOSIS — F319 Bipolar disorder, unspecified: Secondary | ICD-10-CM | POA: Diagnosis present

## 2020-02-21 DIAGNOSIS — I959 Hypotension, unspecified: Secondary | ICD-10-CM

## 2020-02-21 DIAGNOSIS — Z681 Body mass index (BMI) 19 or less, adult: Secondary | ICD-10-CM | POA: Diagnosis not present

## 2020-02-21 DIAGNOSIS — R0902 Hypoxemia: Secondary | ICD-10-CM | POA: Diagnosis not present

## 2020-02-21 DIAGNOSIS — R296 Repeated falls: Secondary | ICD-10-CM | POA: Diagnosis not present

## 2020-02-21 DIAGNOSIS — R4182 Altered mental status, unspecified: Secondary | ICD-10-CM | POA: Diagnosis not present

## 2020-02-21 DIAGNOSIS — Z20822 Contact with and (suspected) exposure to covid-19: Secondary | ICD-10-CM | POA: Diagnosis not present

## 2020-02-21 DIAGNOSIS — R918 Other nonspecific abnormal finding of lung field: Secondary | ICD-10-CM | POA: Diagnosis present

## 2020-02-21 DIAGNOSIS — J181 Lobar pneumonia, unspecified organism: Secondary | ICD-10-CM | POA: Diagnosis not present

## 2020-02-21 DIAGNOSIS — Z9181 History of falling: Secondary | ICD-10-CM | POA: Diagnosis not present

## 2020-02-21 DIAGNOSIS — R9431 Abnormal electrocardiogram [ECG] [EKG]: Secondary | ICD-10-CM | POA: Diagnosis not present

## 2020-02-21 DIAGNOSIS — M5124 Other intervertebral disc displacement, thoracic region: Secondary | ICD-10-CM | POA: Diagnosis not present

## 2020-02-21 DIAGNOSIS — Z79899 Other long term (current) drug therapy: Secondary | ICD-10-CM

## 2020-02-21 LAB — CBC WITH DIFFERENTIAL/PLATELET
Abs Immature Granulocytes: 0.02 10*3/uL (ref 0.00–0.07)
Basophils Absolute: 0 10*3/uL (ref 0.0–0.1)
Basophils Relative: 1 %
Eosinophils Absolute: 0.1 10*3/uL (ref 0.0–0.5)
Eosinophils Relative: 2 %
HCT: 42.4 % (ref 36.0–46.0)
Hemoglobin: 13 g/dL (ref 12.0–15.0)
Immature Granulocytes: 0 %
Lymphocytes Relative: 25 %
Lymphs Abs: 2 10*3/uL (ref 0.7–4.0)
MCH: 28.1 pg (ref 26.0–34.0)
MCHC: 30.7 g/dL (ref 30.0–36.0)
MCV: 91.6 fL (ref 80.0–100.0)
Monocytes Absolute: 0.7 10*3/uL (ref 0.1–1.0)
Monocytes Relative: 8 %
Neutro Abs: 5.1 10*3/uL (ref 1.7–7.7)
Neutrophils Relative %: 64 %
Platelets: 235 10*3/uL (ref 150–400)
RBC: 4.63 MIL/uL (ref 3.87–5.11)
RDW: 14.9 % (ref 11.5–15.5)
WBC: 7.9 10*3/uL (ref 4.0–10.5)
nRBC: 0 % (ref 0.0–0.2)

## 2020-02-21 LAB — COMPREHENSIVE METABOLIC PANEL
ALT: 10 U/L (ref 0–44)
AST: 12 U/L — ABNORMAL LOW (ref 15–41)
Albumin: 3.5 g/dL (ref 3.5–5.0)
Alkaline Phosphatase: 45 U/L (ref 38–126)
Anion gap: 10 (ref 5–15)
BUN: 10 mg/dL (ref 8–23)
CO2: 23 mmol/L (ref 22–32)
Calcium: 9.1 mg/dL (ref 8.9–10.3)
Chloride: 108 mmol/L (ref 98–111)
Creatinine, Ser: 0.81 mg/dL (ref 0.44–1.00)
GFR, Estimated: >60 mL/min (ref >60)
Glucose, Bld: 87 mg/dL (ref 70–99)
Potassium: 3.5 mmol/L (ref 3.5–5.1)
Sodium: 141 mmol/L (ref 135–145)
Total Bilirubin: 1.3 mg/dL — ABNORMAL HIGH (ref 0.3–1.2)
Total Protein: 5.5 g/dL — ABNORMAL LOW (ref 6.5–8.1)

## 2020-02-21 LAB — RESP PANEL BY RT-PCR (FLU A&B, COVID) ARPGX2
Influenza A by PCR: NEGATIVE
Influenza B by PCR: NEGATIVE
SARS Coronavirus 2 by RT PCR: NEGATIVE

## 2020-02-21 LAB — MAGNESIUM: Magnesium: 2 mg/dL (ref 1.7–2.4)

## 2020-02-21 LAB — LACTIC ACID, PLASMA: Lactic Acid, Venous: 1.8 mmol/L (ref 0.5–1.9)

## 2020-02-21 MED ORDER — VANCOMYCIN HCL 750 MG/150ML IV SOLN
750.0000 mg | Freq: Two times a day (BID) | INTRAVENOUS | Status: DC
  Administered 2020-02-22: 750 mg via INTRAVENOUS
  Filled 2020-02-21 (×2): qty 150

## 2020-02-21 MED ORDER — ENOXAPARIN SODIUM 40 MG/0.4ML ~~LOC~~ SOLN
40.0000 mg | SUBCUTANEOUS | Status: DC
  Administered 2020-02-22 – 2020-02-24 (×3): 40 mg via SUBCUTANEOUS
  Filled 2020-02-21 (×3): qty 0.4

## 2020-02-21 MED ORDER — ALPRAZOLAM 0.5 MG PO TABS
1.0000 mg | ORAL_TABLET | Freq: Two times a day (BID) | ORAL | Status: DC | PRN
  Administered 2020-02-21 – 2020-02-27 (×7): 1 mg via ORAL
  Filled 2020-02-21: qty 2
  Filled 2020-02-21: qty 4
  Filled 2020-02-21 (×6): qty 2

## 2020-02-21 MED ORDER — ALBUTEROL SULFATE HFA 108 (90 BASE) MCG/ACT IN AERS
1.0000 | INHALATION_SPRAY | RESPIRATORY_TRACT | Status: DC | PRN
  Filled 2020-02-21: qty 6.7

## 2020-02-21 MED ORDER — SODIUM CHLORIDE 0.9 % IV SOLN
2.0000 g | Freq: Three times a day (TID) | INTRAVENOUS | Status: DC
  Administered 2020-02-21 – 2020-02-24 (×8): 2 g via INTRAVENOUS
  Filled 2020-02-21 (×11): qty 2

## 2020-02-21 MED ORDER — ACETAMINOPHEN 325 MG PO TABS
650.0000 mg | ORAL_TABLET | Freq: Four times a day (QID) | ORAL | Status: DC | PRN
  Filled 2020-02-21: qty 2

## 2020-02-21 MED ORDER — VITAMIN B-12 1000 MCG PO TABS
500.0000 ug | ORAL_TABLET | Freq: Every day | ORAL | Status: DC
  Administered 2020-02-22 – 2020-02-26 (×5): 500 ug via ORAL
  Filled 2020-02-21 (×5): qty 1

## 2020-02-21 MED ORDER — CYCLOBENZAPRINE HCL 10 MG PO TABS: 10.0000 mg | ORAL_TABLET | Freq: Every evening | ORAL | Status: DC | PRN

## 2020-02-21 MED ORDER — VANCOMYCIN HCL 1250 MG/250ML IV SOLN
1250.0000 mg | Freq: Once | INTRAVENOUS | Status: AC
  Administered 2020-02-21: 1250 mg via INTRAVENOUS
  Filled 2020-02-21: qty 250

## 2020-02-21 MED ORDER — NICOTINE 21 MG/24HR TD PT24: 21.0000 mg | MEDICATED_PATCH | Freq: Every day | TRANSDERMAL | Status: DC | PRN

## 2020-02-21 MED ORDER — LEVOFLOXACIN IN D5W 750 MG/150ML IV SOLN: 750.0000 mg | Freq: Once | INTRAVENOUS | Status: DC

## 2020-02-21 MED ORDER — ONDANSETRON HCL 4 MG/2ML IJ SOLN: 4.0000 mg | Freq: Four times a day (QID) | INTRAMUSCULAR | Status: DC | PRN

## 2020-02-21 MED ORDER — ALPRAZOLAM 0.25 MG PO TABS: 2.0000 mg | ORAL_TABLET | Freq: Every evening | ORAL | Status: DC | PRN

## 2020-02-21 MED ORDER — SODIUM CHLORIDE 0.9 % IV BOLUS
1000.0000 mL | Freq: Once | INTRAVENOUS | Status: AC
  Administered 2020-02-21: 1000 mL via INTRAVENOUS

## 2020-02-21 MED ORDER — SODIUM CHLORIDE 0.9 % IV SOLN: INTRAVENOUS | Status: AC

## 2020-02-21 MED ORDER — ACETAMINOPHEN 650 MG RE SUPP: 650.0000 mg | Freq: Four times a day (QID) | RECTAL | Status: DC | PRN

## 2020-02-21 MED ORDER — RISPERIDONE 3 MG PO TABS
3.0000 mg | ORAL_TABLET | Freq: Every day | ORAL | Status: DC
  Administered 2020-02-21 – 2020-02-23 (×3): 3 mg via ORAL
  Filled 2020-02-21 (×4): qty 1

## 2020-02-21 NOTE — ED Notes (Signed)
The pts daughter has called   And she reports that she thinks is aspirating because she coughs sometimes  When she drinks

## 2020-02-21 NOTE — ED Notes (Signed)
Patient transported to CT via stretcher by transport tech.

## 2020-02-21 NOTE — ED Notes (Signed)
pts wants water

## 2020-02-21 NOTE — Progress Notes (Signed)
Pharmacy Antibiotic Note  [name] is a 64 y.o. female admitted on 02/21/2020 with pneumonia.  Pharmacy has been consulted for Cefepime and Vancomycin dosing.      Temp (24hrs), Avg:97.5 F (36.4 C), Min:97.5 F (36.4 C), Max:97.5 F (36.4 C)  Recent Labs  Lab 02/21/20 1614 02/21/20 1618  WBC  --  7.9  CREATININE  --  0.81  LATICACIDVEN 1.8  --     Estimated Creatinine Clearance: 67.1 mL/min (by C-G formula based on SCr of 0.81 mg/dL).    Allergies  Allergen Reactions  . Penicillins Other (See Comments)    Yeast infection Did it involve swelling of the face/tongue/throat, SOB, or low BP? No Did it involve sudden or severe rash/hives, skin peeling, or any reaction on the inside of your mouth or nose? No Did you need to seek medical attention at a hospital or doctor's office? Yes When did it last happen? More than 5 years ago If all above answers are "NO", may proceed with cephalosporin use.     Antimicrobials this admission: 12/5 Cefepime >>  12/5 Vancomycin >>   Dose adjustments this admission: N/a  Microbiology results: Pending   Plan:  - Cefepime 2g IV q8h - Vancomycin 1250mg  IV x 1 dose  - Followed by Vancomycin 750mg  IV q12h - Goal trough ~15 - Monitor patients renal function and urine output  - De-escalate ABX when appropriate   Thank you for allowing pharmacy to be a part of this patient's care.  Duanne Limerick PharmD. BCPS 02/21/2020 8:34 PM

## 2020-02-21 NOTE — ED Provider Notes (Signed)
[name] Provider Note   CSN: [id number] Arrival date & time: 02/21/20  1357     History Chief Complaint  Patient presents with  . Extremity Weakness    [name] is a 64 y.o. female.  Pt presents to the ED today with leg weakness and low bp.  Pt was admitted to the hospital from 9/30-10/4 for pneumonia.  Since she was d/c, she has felt very weak.  She has been seen by neurology and saw Dr. Felecia Shelling on 11/22.  He checked her for myasthenia gravis and did other labs which were nl.  He recommended PT twice a week.  She has been doing that, but does not feel like she is any better.  She had been living with her mom, but moved back to her own place 2 days ago.  She said she's not been able to walk.  She has had a cough.  No fever.        Past Medical History:  Diagnosis Date  . Anxiety    anxiety and mild depressive order systoms  . Bipolar disorder ([place])     (02/20/2013)  . Cervical cancer ([place]) 03/19/1980  . Depression   . Dysrhythmia   . ULAGTXMI(680.3)    "monthly" (02/20/2013)  . Pacemaker    medtronic  . PONV (postoperative nausea and vomiting)   . Sinus pause 02/14/2013  . Syncope and collapse    echo-April 12,2012-EF 55% Echo normal; recorder with prolonged sinus pauses --> status post  . Tobacco abuse   . Vertigo     Patient Active Problem List   Diagnosis Date Noted  . Proximal muscle weakness 02/08/2020  . Dysphagia 02/08/2020  . Gait disturbance 01/04/2020  . Elevated CK 01/04/2020  . Weakness of both lower extremities 01/04/2020  . Encephalopathy   . Polypharmacy   . CAP (community acquired pneumonia) 12/18/2019  . Fall 12/18/2019  . Hypokalemia   . Abnormal liver function test   . Cervical spondylosis 09/23/2019  . Bilateral elbow joint pain 08/26/2019  . Memory loss 07/02/2019  . Depression with anxiety 07/02/2019  . B12 deficiency 07/02/2019  . Lumbar radiculopathy 02/10/2019  . History of fusion of  lumbar spine 02/10/2019  . Dysesthesia 02/10/2019  . Retroperitoneal fibrosis   . Pelvic adhesions   . Left ovarian cyst 11/17/2018  . Elevated tumor markers 11/17/2018  . Dyspnea 09/15/2018  . Chest pain of uncertain etiology 21/22/4825  . Laryngopharyngeal reflux (LPR) 05/19/2018  . Mood disorder ([place]) 04/16/2018  . Insomnia 04/16/2018  . Attention deficit hyperactivity disorder (ADHD) 04/16/2018  . Neck pain on right side 09/09/2014  . 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 Family history of arrhythmogenic right ventricular cardiomyopathy 03/30/2013  . 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 02/20/2013  . Sinus pause 02/14/2013  . Hx of syncope- s/p Loop recorder 11/06/12 10/29/2012  . Vertigo 10/29/2012  . Tobacco abuse 10/29/2012  . ANXIETY 11/14/2009  . CERUMEN IMPACTION, RIGHT 11/14/2009  . BENIGN POSITIONAL VERTIGO 11/14/2009  . EAR PAIN, RIGHT 11/14/2009  . SCIATICA 05/09/2009    Past Surgical History:  Procedure Laterality Date  . ABDOMINAL HYSTERECTOMY  03/19/1980   "partial"  . BACK SURGERY    . CERVICAL FUSION Left 11/2013   C4-C6  . CERVICAL FUSION     3,4,5,  . INSERT / REPLACE / REMOVE PACEMAKER  02/20/2013   medtronic  . LOOP RECORDER IMPLANT N/A 11/06/2012   Procedure: LINQ LOOP RECORDER  IMPLANT;  Surgeon: Sanda Klein, MD;  Location: [facility] CATH LAB;  Service: Cardiovascular;  Laterality: N/A;  . NECK SURGERY  11/17/2013  . PACEMAKER PLACEMENT N/A 02/16/2013  . PERMANENT PACEMAKER INSERTION N/A 02/20/2013   Procedure: PERMANENT PACEMAKER INSERTION;  Surgeon: Sanda Klein, MD;  Location: [facility] CATH LAB;  Service: Cardiovascular;  Laterality: N/A;  . POSTERIOR LUMBAR FUSION  ~ 2009  . ROBOTIC ASSISTED SALPINGO OOPHERECTOMY Bilateral 11/25/2018   Procedure: XI ROBOTIC ASSISTED BILATERAL SALPINGO OOPHORECTOMY and LYSIS OF ADHESIONS.;  Surgeon: Everitt Amber, MD;  Location: [facility] ORS;  Service: Gynecology;  Laterality: Bilateral;  . SPINAL FUSION   03/19/2014  . TONSILLECTOMY    . TRANSTHORACIC ECHOCARDIOGRAM  07/11/2010   The left atrial size is normal.There is no evidence of mitral vavle prolaspe.Right ventriicular systolic pressure is normal . Injection of contrast documented no interatrial shunt. Essentially normal 2D echo -doppler study      OB History   No obstetric history on file.     Family History  Adopted: Yes  Problem Relation Age of Onset  . Heart attack Brother     Social History   Tobacco Use  . Smoking status: Current Every Day Smoker    Packs/day: 0.50    Years: 40.00    Pack years: 20.00    Types: Cigarettes  . Smokeless tobacco: Never Used  . Tobacco comment: 14 cigarette per day  Vaping Use  . Vaping Use: Never used  Substance Use Topics  . Alcohol use: No  . Drug use: No    Home Medications Prior to Admission medications   Medication Sig Start Date End Date Taking? Authorizing Provider  ALPRAZolam (XANAX) 1 MG tablet 1 po bid prn and occas extra 1 po mid-day. Patient taking differently: Take 2 mg by mouth at bedtime.  01/29/20  Yes Donnal Moat T, PA-C  [employer] B-12 500 MCG tablet Take 500 mcg by mouth daily. 11/27/19  Yes [provider]  cyclobenzaprine (FLEXERIL) 10 MG tablet Take 10 mg by mouth at bedtime.  12/30/19  Yes [provider]  risperiDONE (RISPERDAL) 3 MG tablet Take 1 tablet (3 mg total) by mouth at bedtime. 01/29/20  Yes Donnal Moat T, PA-C    Allergies    Penicillins  Review of Systems   Review of Systems  Neurological: Positive for weakness.  All other systems reviewed and are negative.   Physical Exam Updated Vital Signs BP 119/70   Pulse 73   Temp (!) 97.5 F (36.4 C) (Oral)   Resp 20   SpO2 100%   Physical Exam Vitals and nursing note reviewed.  Constitutional:      Appearance: Normal appearance.  HENT:     Head: Normocephalic and atraumatic.     Right Ear: External ear normal.     Left Ear: External ear normal.     Nose: Nose  normal.     Mouth/Throat:     Mouth: Mucous membranes are moist.     Pharynx: Oropharynx is clear.  Eyes:     Extraocular Movements: Extraocular movements intact.     Conjunctiva/sclera: Conjunctivae normal.     Pupils: Pupils are equal, round, and reactive to light.  Cardiovascular:     Rate and Rhythm: Normal rate and regular rhythm.     Pulses: Normal pulses.     Heart sounds: Normal heart sounds.  Pulmonary:     Effort: Pulmonary effort is normal.     Breath sounds: Rhonchi present.  Abdominal:  General: Abdomen is flat. Bowel sounds are normal.     Palpations: Abdomen is soft.  Musculoskeletal:        General: Normal range of motion.     Cervical back: Normal range of motion and neck supple.  Skin:    General: Skin is warm.     Capillary Refill: Capillary refill takes less than 2 seconds.  Neurological:     General: No focal deficit present.     Mental Status: She is alert and oriented to person, place, and time.     Comments: Weakness diffusely  Psychiatric:        Mood and Affect: Affect is flat.        Speech: Speech is delayed.        Behavior: Behavior is slowed.     Comments: Pt won't make eye contact.     ED Results / Procedures / Treatments   Labs (all labs ordered are listed, but only abnormal results are displayed) Labs Reviewed  COMPREHENSIVE METABOLIC PANEL - Abnormal; Notable for the following components:      Result Value   Total Protein 5.5 (*)    AST 12 (*)    Total Bilirubin 1.3 (*)    All other components within normal limits  RESP PANEL BY RT-PCR (FLU A&B, COVID) ARPGX2  CULTURE, BLOOD (ROUTINE X 2)  CULTURE, BLOOD (ROUTINE X 2)  CBC WITH DIFFERENTIAL/PLATELET  LACTIC ACID, PLASMA  MAGNESIUM  URINALYSIS, ROUTINE W REFLEX MICROSCOPIC  TSH  CBG MONITORING, ED    EKG EKG Interpretation  Date/Time:  Sunday February 21 2020 14:12:03 EST Ventricular Rate:  85 PR Interval:    QRS Duration: 85 QT Interval:  367 QTC  Calculation: 437 R Axis:   12 Text Interpretation: Sinus rhythm Probable left atrial enlargement Abnormal R-wave progression, early transition No significant change since last tracing Confirmed by Isla Pence [phone]) on 02/21/2020 3:15:24 PM   Radiology DG Chest 2 View  Result Date: 02/21/2020 CLINICAL DATA:  Altered mental status. EXAM: CHEST - 2 VIEW COMPARISON:  01/01/2020 FINDINGS: Right lung clear. There is retrocardiac left base atelectasis or infiltrate. Cardiopericardial silhouette is at upper limits of normal for size. The visualized bony structures of the thorax show no acute abnormality. Telemetry leads overlie the chest. IMPRESSION: Retrocardiac atelectasis or infiltrate. Electronically Signed   By: Misty Stanley M.D.   On: 02/21/2020 15:30   CT Head Wo Contrast  Result Date: 02/21/2020 CLINICAL DATA:  Mental status change of unknown cause. EXAM: CT HEAD WITHOUT CONTRAST TECHNIQUE: Contiguous axial images were obtained from the base of the skull through the vertex without intravenous contrast. COMPARISON:  May 26, 2019 FINDINGS: Brain: No subdural, epidural, or subarachnoid hemorrhage. Ventricles and sulci are stable. No acute cortical ischemia infarct. No mass effect or midline shift. Cerebellum, brainstem, and basal cisterns are unremarkable. Vascular: No hyperdense vessel or unexpected calcification. Skull: Normal. Negative for fracture or focal lesion. Sinuses/Orbits: No acute finding. Other: None. IMPRESSION: No acute intracranial abnormalities. Electronically Signed   By: Dorise Bullion III M.D   On: 02/21/2020 16:45   CT Chest Wo Contrast  Result Date: 02/21/2020 CLINICAL DATA:  Weakness, hypotension, multiple falls EXAM: CT CHEST WITHOUT CONTRAST TECHNIQUE: Multidetector CT imaging of the chest was performed following the standard protocol without IV contrast. COMPARISON:  02/21/2020, 04/20/2019 FINDINGS: Cardiovascular: Heart is unremarkable without pericardial effusion.  Normal caliber of the thoracic aorta. Minimal atherosclerosis of the aortic arch. Dual lead pacemaker unchanged. Mediastinum/Nodes: No enlarged  mediastinal or axillary lymph nodes. Thyroid gland, trachea, and esophagus demonstrate no significant findings. Lungs/Pleura: Left lower lobe consolidation is identified, corresponding to chest x-ray findings. Favor left lower lobe bronchopneumonia over atelectasis. Mild background emphysema. No effusion or pneumothorax. The central airways are patent. Upper Abdomen: No acute abnormality. Musculoskeletal: No acute or destructive bony lesions. Reconstructed images demonstrate no additional findings. IMPRESSION: 1. Left lower lobe consolidation favor bronchopneumonia. 2. Aortic Atherosclerosis (ICD10-I70.0) and Emphysema (ICD10-J43.9). Electronically Signed   By: Randa Ngo M.D.   On: 02/21/2020 19:05    Procedures Procedures (including critical care time)  Medications Ordered in ED Medications  levofloxacin (LEVAQUIN) IVPB 750 mg (has no administration in time range)  sodium chloride 0.9 % bolus 1,000 mL (has no administration in time range)  sodium chloride 0.9 % bolus 1,000 mL (0 mLs Intravenous Stopped 02/21/20 1816)    ED Course  I have reviewed the triage vital signs and the nursing notes.  Pertinent labs & imaging results that were available during my care of the patient were reviewed by me and considered in my medical decision making (see chart for details).    MDM Rules/Calculators/A&P                          Pt's hypotension has improved with IVFs.  She does have a LLL pneumonia.  However, I don't think she is septic.  She remains very weak and is unable to stand.  She has been at home alone, so I do not think she has been able to eat and drink much. She is interested in going to a SNF.  Covid negative.  She was d/w Dr. Velia Meyer ([facility]) for admission.  [name] was evaluated in Emergency Department on 02/21/2020 for the symptoms  described in the history of present illness. She was evaluated in the context of the global COVID-19 pandemic, which necessitated consideration that the patient might be at risk for infection with the SARS-CoV-2 virus that causes COVID-19. Institutional protocols and algorithms that pertain to the evaluation of patients at risk for COVID-19 are in a state of rapid change based on information released by regulatory bodies including the CDC and federal and state organizations. These policies and algorithms were followed during the patient's care in the ED.   Final Clinical Impression(s) / ED Diagnoses Final diagnoses:  Community acquired pneumonia of left lower lobe of lung  Hypotension, unspecified hypotension type  Dehydration  Ambulatory dysfunction    Rx / DC Orders ED Discharge Orders    None       Isla Pence, MD 02/21/20 1940

## 2020-02-21 NOTE — ED Notes (Signed)
The pt  Reports her night  Time medicine  Medicine ordered for 2200 pt given that information

## 2020-02-21 NOTE — H&P (Addendum)
History and Physical    PLEASE NOTE THAT DRAGON DICTATION SOFTWARE WAS USED IN THE CONSTRUCTION OF THIS NOTE.   [name] KCM:[id number] DOB: Nov 14, 1955 DOA: 02/21/2020  PCP: Lujean Amel, MD Patient coming from: home   I have personally briefly reviewed patient's old medical records in [place]  Chief Complaint: Generalized weakness  HPI: [name] is a 64 y.o. female with medical history significant for vitamin B12 deficiency, tobacco abuse, depression, anxiety, arrhythmia status post pacemaker placement in December 2014, who is admitted to [facility] on 02/21/2020 with HCAP pneumonia after presenting from home to [facility] Emergency Department complaining of generalized weakness.   The patient reports progressive generalized weakness over the last 2 to 3 months following her hospitalization in September 2021 for pneumonia.  She notes significant worsening of this generalized weakness over the last 4 to 5 days, resulting in difficulty in performing her ADLs.  Specifically, over that timeframe, she reports difficulty in bathing and preparing her food in the context of this generalized weakness.  As a result, she conveys decline in her oral intake over the last few days. She denies any associated acute focal weakness, any acute focal paresthesias or numbness, or any recent dysarthria, slurred speech, vertigo, change in vision, or new onset dysphagia.  Coinciding with the worsening of her generalized weakness over the last several days, she notes new onset nonproductive cough over that timeframe.  She denies any associated shortness of breath, orthopnea, PND, or peripheral edema.  Denies any associated chest pain, palpitations, diaphoresis, nausea, vomiting, or wheezing.  She also denies any associated subjective fever, chills, rigors.  No recent trauma.  Denies any recent dysuria, gross hematuria, or change in urinary urgency/frequency.   Following her following  hospitalization for community-acquired pneumonia in September 2021, the patient reports that she was discharged to home, living at her mother's house for the next few months.  However, 3 to 4 days ago, she reports that her mother was diagnosed with COVID-19, prompting the patient to return to her own home over the last 2 to 3 days.  In the absence of her mother being able to provide any assistance with her ADLs at this time, and in the context of no additional local hemorrhage, the patient is concerned about her ability to perform her own ADLs given progression of her generalized weakness, as further described above.  The patient reports that she carries an underlying diagnosis of myasthenia gravis.  As such, she reports that she was evaluated by her outpatient neurologist 2 weeks ago in the context of persistence of her generalized weakness, with exam and ensuing acetylcholine antibody evaluation reportedly suggestive of no evidence of exacerbation of her myasthenia gravis.  Past medical history is also notable for history of vitamin B12 deficiency, for which she underwent serum testing on 02/08/2020, with vitamin B12 level at that time found to be within normal limits at 1153.  Past medical history is also notable for the patient's report of history of arrhythmia prompting placement of a Medtronic pacemaker in December 2014.  She does not recall the specific underlying arrhythmia that prompted this pacemaker placement.  Denies any recent presyncope or syncope.  She denies any recent headache, neck stiffness, rhinitis, rhinorrhea, sore throat, abdominal pain, diarrhea, or rash.  No recent traveling.    ED Course:  Vital signs in the ED were notable for the following: Temperature max 97.5; heart rate 70-82; initial blood pressure 93/63, which is increased  to 119/70 following interval administration of IV fluids, as further described below; respiratory rate 19-22; oxygen saturation 98% on room air.  Labs  were notable for the following: CMP was notable for the following: Sodium 141, bicarbonate 23, BUN 10, creatinine 0.81 relative to most recent prior creatinine data point of 1.01 on 01/04/2020, glucose 87.  TSH, urinalysis, and B12 level were ordered in the ED this evening, with results of such currently pending.  CBC notable for the following: White blood cell count of 7900 with 64% neutrophils.  Lactic acid 1.8.  COVID-19 teen/influenza PCR was performed in the ED this evening, and found to be negative.  Blood cultures x2 were collected this evening prior to initiation of any antibiotics.  2 view chest x-ray, in comparison to most recent prior plain films of the chest performed on 01/01/2020, showed interval development of left basilar infiltrate, but otherwise showed no evidence of acute cardiopulmonary process.  CT chest showed left lower lobe consolidation, suggestive of bronchopneumonia, but otherwise showed no evidence of acute cardiopulmonary process.  EKG, in comparison to most recent prior performed on 12/22/2019, showed sinus rhythm with heart rate 85, QTc 437; nonspecific T wave inversion in V1, unchanged from most recent prior EKG, with no evidence of ST changes, including no evidence of ST elevation.  While in the ED, the following were administered: Levaquin 750 mg IV x1, normal saline x2 L bolus.  Separately, the patient was admitted to the [place] floor for further evaluation management of presenting HCAP pneumonia.     Review of Systems: As per HPI otherwise 10 point review of systems negative.   Past Medical History:  Diagnosis Date  . Anxiety    anxiety and mild depressive order systoms  . Bipolar disorder ([place])     (02/20/2013)  . Cervical cancer ([place]) 03/19/1980  . Depression   . Dysrhythmia   . TGGYIRSW(546.2)    "monthly" (02/20/2013)  . Pacemaker    medtronic  . PONV (postoperative nausea and vomiting)   . Sinus pause 02/14/2013  . Syncope and collapse    echo-April  12,2012-EF 55% Echo normal; recorder with prolonged sinus pauses --> status post  . Tobacco abuse   . Vertigo     Past Surgical History:  Procedure Laterality Date  . ABDOMINAL HYSTERECTOMY  03/19/1980   "partial"  . BACK SURGERY    . CERVICAL FUSION Left 11/2013   C4-C6  . CERVICAL FUSION     3,4,5,  . INSERT / REPLACE / REMOVE PACEMAKER  02/20/2013   medtronic  . LOOP RECORDER IMPLANT N/A 11/06/2012   Procedure: LINQ LOOP RECORDER IMPLANT;  Surgeon: Sanda Klein, MD;  Location: [facility] CATH LAB;  Service: Cardiovascular;  Laterality: N/A;  . NECK SURGERY  11/17/2013  . PACEMAKER PLACEMENT N/A 02/16/2013  . PERMANENT PACEMAKER INSERTION N/A 02/20/2013   Procedure: PERMANENT PACEMAKER INSERTION;  Surgeon: Sanda Klein, MD;  Location: [facility] CATH LAB;  Service: Cardiovascular;  Laterality: N/A;  . POSTERIOR LUMBAR FUSION  ~ 2009  . ROBOTIC ASSISTED SALPINGO OOPHERECTOMY Bilateral 11/25/2018   Procedure: XI ROBOTIC ASSISTED BILATERAL SALPINGO OOPHORECTOMY and LYSIS OF ADHESIONS.;  Surgeon: Everitt Amber, MD;  Location: [facility] ORS;  Service: Gynecology;  Laterality: Bilateral;  . SPINAL FUSION  03/19/2014  . TONSILLECTOMY    . TRANSTHORACIC ECHOCARDIOGRAM  07/11/2010   The left atrial size is normal.There is no evidence of mitral vavle prolaspe.Right ventriicular systolic pressure is normal . Injection of contrast documented no interatrial shunt. Essentially normal 2D  echo -doppler study     Social History:  reports that she has been smoking cigarettes. She has a 20.00 pack-year smoking history. She has never used smokeless tobacco. She reports that she does not drink alcohol and does not use drugs.   Allergies  Allergen Reactions  . Penicillins Other (See Comments)    Yeast infection Did it involve swelling of the face/tongue/throat, SOB, or low BP? No Did it involve sudden or severe rash/hives, skin peeling, or any reaction on the inside of your mouth or nose? No Did you need to seek medical  attention at a hospital or doctor's office? Yes When did it last happen? More than 5 years ago If all above answers are "NO", may proceed with cephalosporin use.     Family History  Adopted: Yes  Problem Relation Age of Onset  . Heart attack Brother      Prior to Admission medications   Medication Sig Start Date End Date Taking? Authorizing Provider  ALPRAZolam (XANAX) 1 MG tablet 1 po bid prn and occas extra 1 po mid-day. Patient taking differently: Take 2 mg by mouth at bedtime.  01/29/20  Yes Donnal Moat T, PA-C  [facility] B-12 500 MCG tablet Take 500 mcg by mouth daily. 11/27/19  Yes [provider]  cyclobenzaprine (FLEXERIL) 10 MG tablet Take 10 mg by mouth at bedtime.  12/30/19  Yes [provider]  risperiDONE (RISPERDAL) 3 MG tablet Take 1 tablet (3 mg total) by mouth at bedtime. 01/29/20  Yes Addison Lank, PA-C     Objective    Physical Exam: Vitals:   02/21/20 1600 02/21/20 1615 02/21/20 1730 02/21/20 1815  BP: 109/70 113/70  119/70  Pulse: 71 75 70 73  Resp: (!) '22 20 20 20  ' Temp:      TempSrc:      SpO2: 100% 100% 98% 100%    General: appears to be stated age; alert, oriented Skin: warm, dry, no rash Head:  AT/[place] Mouth:  Oral mucosa membranes appear dry, normal dentition Neck: supple; trachea midline Heart:  RRR; did not appreciate any M/R/G Lungs: LLL rales; otherwise CTAB; did not appreciate any wheezes or rhonchi Abdomen: + BS; soft, ND, NT Vascular: 2+ pedal pulses b/l; 2+ radial pulses b/l Extremities: no peripheral edema, no muscle wasting Neuro: 4 out of 5 strength in the bilateral lower extremities; 5 out of 5 strength in the upper extremities bilaterally, sensation intact in upper and lower extremities bilaterally ; cranial nerves II through XII appear grossly intact; no evidence suggestive of slurred speech, dysarthria, or facial droop; normal muscle tone; no tremors.    Labs on Admission: I have personally reviewed following  labs and imaging studies  CBC: Recent Labs  Lab 02/21/20 1618  WBC 7.9  NEUTROABS 5.1  HGB 13.0  HCT 42.4  MCV 91.6  PLT 233   Basic Metabolic Panel: Recent Labs  Lab 02/21/20 1618  NA 141  K 3.5  CL 108  CO2 23  GLUCOSE 87  BUN 10  CREATININE 0.81  CALCIUM 9.1  MG 2.0   GFR: Estimated Creatinine Clearance: 67.1 mL/min (by C-G formula based on SCr of 0.81 mg/dL). Liver Function Tests: Recent Labs  Lab 02/21/20 1618  AST 12*  ALT 10  ALKPHOS 45  BILITOT 1.3*  PROT 5.5*  ALBUMIN 3.5   No results for input(s): LIPASE, AMYLASE in the last 168 hours. No results for input(s): AMMONIA in the last 168 hours. Coagulation Profile: No results  for input(s): INR, PROTIME in the last 168 hours. Cardiac Enzymes: No results for input(s): CKTOTAL, CKMB, CKMBINDEX, TROPONINI in the last 168 hours. BNP (last 3 results) No results for input(s): PROBNP in the last 8760 hours. HbA1C: No results for input(s): HGBA1C in the last 72 hours. CBG: No results for input(s): GLUCAP in the last 168 hours. Lipid Profile: No results for input(s): CHOL, HDL, LDLCALC, TRIG, CHOLHDL, LDLDIRECT in the last 72 hours. Thyroid Function Tests: No results for input(s): TSH, T4TOTAL, FREET4, T3FREE, THYROIDAB in the last 72 hours. Anemia Panel: No results for input(s): VITAMINB12, FOLATE, FERRITIN, TIBC, IRON, RETICCTPCT in the last 72 hours. Urine analysis:    Component Value Date/Time   COLORURINE YELLOW 12/18/2019 0752   APPEARANCEUR CLEAR 12/18/2019 0752   LABSPEC 1.021 12/18/2019 0752   PHURINE 5.0 12/18/2019 0752   GLUCOSEU NEGATIVE 12/18/2019 0752   HGBUR NEGATIVE 12/18/2019 0752   BILIRUBINUR NEGATIVE 12/18/2019 0752   KETONESUR 5 (A) 12/18/2019 0752   PROTEINUR NEGATIVE 12/18/2019 0752   NITRITE NEGATIVE 12/18/2019 0752   LEUKOCYTESUR NEGATIVE 12/18/2019 0752    Radiological Exams on Admission: DG Chest 2 View  Result Date: 02/21/2020 CLINICAL DATA:  Altered mental status.  EXAM: CHEST - 2 VIEW COMPARISON:  01/01/2020 FINDINGS: Right lung clear. There is retrocardiac left base atelectasis or infiltrate. Cardiopericardial silhouette is at upper limits of normal for size. The visualized bony structures of the thorax show no acute abnormality. Telemetry leads overlie the chest. IMPRESSION: Retrocardiac atelectasis or infiltrate. Electronically Signed   By: Misty Stanley M.D.   On: 02/21/2020 15:30   CT Head Wo Contrast  Result Date: 02/21/2020 CLINICAL DATA:  Mental status change of unknown cause. EXAM: CT HEAD WITHOUT CONTRAST TECHNIQUE: Contiguous axial images were obtained from the base of the skull through the vertex without intravenous contrast. COMPARISON:  May 26, 2019 FINDINGS: Brain: No subdural, epidural, or subarachnoid hemorrhage. Ventricles and sulci are stable. No acute cortical ischemia infarct. No mass effect or midline shift. Cerebellum, brainstem, and basal cisterns are unremarkable. Vascular: No hyperdense vessel or unexpected calcification. Skull: Normal. Negative for fracture or focal lesion. Sinuses/Orbits: No acute finding. Other: None. IMPRESSION: No acute intracranial abnormalities. Electronically Signed   By: Dorise Bullion III M.D   On: 02/21/2020 16:45   CT Chest Wo Contrast  Result Date: 02/21/2020 CLINICAL DATA:  Weakness, hypotension, multiple falls EXAM: CT CHEST WITHOUT CONTRAST TECHNIQUE: Multidetector CT imaging of the chest was performed following the standard protocol without IV contrast. COMPARISON:  02/21/2020, 04/20/2019 FINDINGS: Cardiovascular: Heart is unremarkable without pericardial effusion. Normal caliber of the thoracic aorta. Minimal atherosclerosis of the aortic arch. Dual lead pacemaker unchanged. Mediastinum/Nodes: No enlarged mediastinal or axillary lymph nodes. Thyroid gland, trachea, and esophagus demonstrate no significant findings. Lungs/Pleura: Left lower lobe consolidation is identified, corresponding to chest x-ray  findings. Favor left lower lobe bronchopneumonia over atelectasis. Mild background emphysema. No effusion or pneumothorax. The central airways are patent. Upper Abdomen: No acute abnormality. Musculoskeletal: No acute or destructive bony lesions. Reconstructed images demonstrate no additional findings. IMPRESSION: 1. Left lower lobe consolidation favor bronchopneumonia. 2. Aortic Atherosclerosis (ICD10-I70.0) and Emphysema (ICD10-J43.9). Electronically Signed   By: Randa Ngo M.D.   On: 02/21/2020 19:05     EKG: Independently reviewed, with result as described above.    Assessment/Plan    [name] is a 64 y.o. female with medical history significant for vitamin B12 deficiency, tobacco abuse, depression, anxiety, arrhythmia status post pacemaker placement in December  2014, who is admitted to [facility] on 02/21/2020 with HCAP pneumonia after presenting from home to [facility] Emergency Department complaining of generalized weakness.    Principal Problem:   HCAP (healthcare-associated pneumonia) Active Problems:   Tobacco abuse   B12 deficiency   Cough   Generalized weakness   GAD (generalized anxiety disorder)    #) Healthcare associated pneumonia: Diagnosis on the basis of 3 to 4 days of new onset nonproductive cough associated with progression of generalized weakness, with presenting chest x-ray showing interval development of left lower lobe infiltrate, with ensuing CT chest also showing evidence of left lower lobe consolidation concerning for bronchopneumonia.  Additionally, the context of hospitalization within the last 3 months, criteria are met for the patient's pneumonia to be considered HCAP in nature, warranting broad-spectrum antibiotics, as further qualified below. COVID-19/influenza PCR performed in the ED this evening were found to be negative. Of note, no SIRS criteria are met at this time, and therefore criteria for sepsis is not met at the present time.   Presenting lactic acid nonelevated at 1.8.  Initial blood pressure was noted to be mildly low at 93/63, however, this improved following interval IV fluids, suggesting an element of dehydration in the context of recent diminished oral intake as opposed to being as a result of underlying infection, although will closely monitor ensuing blood pressure.  Blood cultures x2 collected in the ED this evening prior to initiation of antibiotics.  She received single dose of IV Levaquin in the ED tonight.  However, given the criteria met for HCAP pneumonia, will broaden spectrum of coverage to include MRSA and antipseudomonal coverage.  Will check MRSA PCR, with plan to discontinue IV vancomycin should this result be negative given the high negative predictive value for underlying MRSA pneumonia in the setting of such a result.  Plan: Normal saline at 75 cc/h x 12 hours.  Pharmacy consults have been placed for initiation of IV vancomycin as well as cefepime, as further described above.  Check MRSA PCR, as above.  Check strep pneumoniae urine antigen.  Monitor for results of blood cultures x2 collected in the ED this evening prior to initiation of antibiotics.  Repeat CBC with differential in the morning.  Incentive spirometer ordered.     #) Generalized weakness: The patient reports 4 to 5 days of progression of generalized weakness in the absence of any evidence of acute focal neurologic deficits, resulting in the patient's report of progressive difficulty in performing her ADLs over that timeframe, as further described above.  Suspect contribution from physiologic stress from presenting HCAP pneumonia, as further described above.  We will also check for the presence of a concomitant urinary tract infection, with result of urinalysis pending at this time.  There may also be an element of mild dehydration given the presence of dry oral mucosa membranes on physical exam in the setting of the patient's acknowledgment of  recent decline in oral intake.  Check B12 level in the setting of documented history of a deficiency of such.  Follow-up result of TSH ordered in the ED this evening.  Of note, noncontrast CT of the head performed in the ED this evening showed no evidence of acute intracranial process, including no evidence of intracranial hemorrhage or acute ischemic infarct.  There may also be a pharmacologic contribution in the setting of her outpatient medication regimen, which includes scheduled Xanax at bedtime as well as scheduled Flexeril at bedtime.  Clinically, there does not  appear to be a contribution from exacerbation of reported underlying diagnosis of myasthenia gravis, including recent outpatient evaluation by neurology.  However, will closely monitor trend in patient's generalized weakness and response to the above measures, with consideration for discussing with neurology if no subsequent improvement with these interventions, including management of presenting HCAP pneumonia.   Plan: Work-up and management of presenting HCAP pneumonia, as further described above.  I have placed orders for physical therapy and occupational therapy consultations in the morning.  Follow for result of TSH, as above.  Check MMA level as evaluation for B12 level.  Follow for result urinalysis.  Repeat CMP in the morning.  Gentle IV fluids, as above.  Monitor strict I's and O's.  For now, will change home scheduled Xanax and home scheduled Flexeril as needed.  Check urinary drug screen.  Fall precautions ordered.     #) Chronic tobacco abuse: The patient acknowledges that she has a current smoker, having smoked at least 1/2 pack/day over the last 40 years.  Most recently, she reports smoking between half pack to 1 full pack per day.  Plan: Counseled the patient on the importance of complete smoking discontinuation.  As needed nicotine patch has been ordered for use during this hospitalization.     #) Vitamin B12 deficiency:  Documented history of such, for which the patient reports that she is on daily oral B12 supplementation at home.  Per chart review, it appears that her serum B12 level was checked on 02/08/2020, and found to be within normal limits at that time.  However, given interval progression of her generalized weakness, will repeat vitamin B-12 level at this time.  Of note, no evidence of acute anemia per presenting CBC.  Plan: Check MMA as evaluation of underlying B12 level.  Continue home oral B12 supplementation.  Repeat CBC in the morning.     #) Generalized anxiety disorder: The patient reports that she is on Xanax 2 mg p.o. nightly on a scheduled basis.  She confirms that she is not currently on an SSRI or SNRI, but rather is on risperidone as an outpatient in the setting of a diagnosis of bipolar disorder.   Plan: For now, in the setting of presenting generalized weakness, will change her scheduled nightly Xanax to as needed, and monitor ensuing generalized weakness closely.     #) Bipolar disorder: The patient reports that she is on scheduled Risperdone as an outpatient, but otherwise denies any pharmacologic intervention for bipolar at this time.  She is currently alert and oriented x4.  Plan: Continue home risperidone.    DVT prophylaxis: Lovenox 40 mg subcu daily Code Status: Full code Family Communication: none Disposition Plan: Per Rounding Team Consults called: none  Admission status: Inpatient; MedSurg    Of note, this patient was added by me to the following Admit List/Treatment Team: [email or web address]    PLEASE NOTE THAT DRAGON DICTATION SOFTWARE WAS USED IN THE CONSTRUCTION OF THIS NOTE.   Rhetta Mura DO [facility] Pager [phone] From [place]   02/21/2020, 7:42 PM

## 2020-02-21 NOTE — ED Triage Notes (Signed)
BIB EMS for weakness and hyp9otension that hs been ongoing since her d/c from hospital a month ago. Today had 3 falls and no hits to head. Not on blood thinners. Has not voided in 2 days per patient. She lives alone. Hx of chronic pain, anxiety, depression, pacemaker, had pneumonia a month ago.

## 2020-02-22 ENCOUNTER — Other Ambulatory Visit: Payer: Self-pay

## 2020-02-22 ENCOUNTER — Inpatient Hospital Stay (HOSPITAL_COMMUNITY): Payer: Medicare HMO

## 2020-02-22 DIAGNOSIS — R262 Difficulty in walking, not elsewhere classified: Secondary | ICD-10-CM

## 2020-02-22 LAB — URINALYSIS, ROUTINE W REFLEX MICROSCOPIC
Bilirubin Urine: NEGATIVE
Glucose, UA: NEGATIVE mg/dL
Hgb urine dipstick: NEGATIVE
Ketones, ur: NEGATIVE mg/dL
Nitrite: NEGATIVE
Protein, ur: NEGATIVE mg/dL
Specific Gravity, Urine: 1.015 (ref 1.005–1.030)
pH: 5 (ref 5.0–8.0)

## 2020-02-22 LAB — CBC WITH DIFFERENTIAL/PLATELET
Abs Immature Granulocytes: 0.02 10*3/uL (ref 0.00–0.07)
Basophils Absolute: 0.1 10*3/uL (ref 0.0–0.1)
Basophils Relative: 1 %
Eosinophils Absolute: 0.2 10*3/uL (ref 0.0–0.5)
Eosinophils Relative: 3 %
HCT: 37.7 % (ref 36.0–46.0)
Hemoglobin: 12.4 g/dL (ref 12.0–15.0)
Immature Granulocytes: 0 %
Lymphocytes Relative: 25 %
Lymphs Abs: 1.7 10*3/uL (ref 0.7–4.0)
MCH: 29.5 pg (ref 26.0–34.0)
MCHC: 32.9 g/dL (ref 30.0–36.0)
MCV: 89.8 fL (ref 80.0–100.0)
Monocytes Absolute: 0.6 10*3/uL (ref 0.1–1.0)
Monocytes Relative: 9 %
Neutro Abs: 4.4 10*3/uL (ref 1.7–7.7)
Neutrophils Relative %: 62 %
Platelets: 193 10*3/uL (ref 150–400)
RBC: 4.2 MIL/uL (ref 3.87–5.11)
RDW: 14.9 % (ref 11.5–15.5)
WBC: 7 10*3/uL (ref 4.0–10.5)
nRBC: 0 % (ref 0.0–0.2)

## 2020-02-22 LAB — STREP PNEUMONIAE URINARY ANTIGEN: Strep Pneumo Urinary Antigen: NEGATIVE

## 2020-02-22 LAB — RAPID URINE DRUG SCREEN, HOSP PERFORMED
Amphetamines: NOT DETECTED
Barbiturates: NOT DETECTED
Benzodiazepines: POSITIVE — AB
Cocaine: NOT DETECTED
Opiates: NOT DETECTED
Tetrahydrocannabinol: NOT DETECTED

## 2020-02-22 LAB — COMPREHENSIVE METABOLIC PANEL
ALT: 8 U/L (ref 0–44)
AST: 11 U/L — ABNORMAL LOW (ref 15–41)
Albumin: 2.8 g/dL — ABNORMAL LOW (ref 3.5–5.0)
Alkaline Phosphatase: 35 U/L — ABNORMAL LOW (ref 38–126)
Anion gap: 9 (ref 5–15)
BUN: 6 mg/dL — ABNORMAL LOW (ref 8–23)
CO2: 21 mmol/L — ABNORMAL LOW (ref 22–32)
Calcium: 8.6 mg/dL — ABNORMAL LOW (ref 8.9–10.3)
Chloride: 112 mmol/L — ABNORMAL HIGH (ref 98–111)
Creatinine, Ser: 0.71 mg/dL (ref 0.44–1.00)
GFR, Estimated: >60 mL/min (ref >60)
Glucose, Bld: 82 mg/dL (ref 70–99)
Potassium: 3.6 mmol/L (ref 3.5–5.1)
Sodium: 142 mmol/L (ref 135–145)
Total Bilirubin: 1.6 mg/dL — ABNORMAL HIGH (ref 0.3–1.2)
Total Protein: 4.7 g/dL — ABNORMAL LOW (ref 6.5–8.1)

## 2020-02-22 LAB — MRSA PCR SCREENING: MRSA by PCR: NEGATIVE

## 2020-02-22 LAB — MAGNESIUM: Magnesium: 1.7 mg/dL (ref 1.7–2.4)

## 2020-02-22 LAB — TSH: TSH: 1.565 u[IU]/mL (ref 0.350–4.500)

## 2020-02-22 NOTE — ED Notes (Signed)
Report given to rn on 2w 

## 2020-02-22 NOTE — ED Notes (Signed)
imsuccessful attempt to give report

## 2020-02-22 NOTE — ED Notes (Signed)
Pt given sleep medicine but she has not been to sleep   She is asking for more sleep medicine not repeated.  She has been on her call bell almost constantly. Currently she is c/o abd pain and would like a catheter. She finally voided but is still wants a catheter

## 2020-02-22 NOTE — ED Notes (Signed)
The pt voided approx 288ml earlier  After the pt c/o abd pain and needing to urinate  The admitting doctor gave the order for a foley catheter as needed  Just after that order the pt voided 153ml  And has less pain   Holding on the catheter for  now

## 2020-02-22 NOTE — Evaluation (Signed)
Occupational Therapy Evaluation Patient Details Name: [name] MRN: [id number] DOB: September 21, 1955 Today's Date: 02/22/2020    History of Present Illness [name] is a 64 y.o. 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    Clinical Impression   Pt from home where she ambulates with a RW and is mod I for ADL (shower seat and seated for most activities) Her family does assist with IADL (cooking/cleaning). Pt reports frequent falls with "legs just giving out on me" These falling episodes occur in waves and she experienced at least 3 at home alone in living room prior to coming to ED. Before her mother got sick with COVID she had been living there as she is too fatigued/weak for things like cooking/cleaning and ADL just take longer for her to complete. Today she presents quiet, and min to mod A for transfers, MMT 3/5 generalized. Her arms can only get to 60 degrees FF with arms straight, She is DOE 2/4 (SpO2 95%) with transfers and short ambulation to recliner. She requires sitting for grooming/ADL tasks. Pt is motivated and would like to get her strength back and walk better (not fall) Pt asked about CIR level therapy vs SNF. At this time recommending SNF level therapies and continued skilled OT in the acute setting.    Follow Up Recommendations  SNF    Equipment Recommendations  3 in 1 bedside commode    Recommendations for Other Services       Precautions / Restrictions Precautions Precautions: Fall Restrictions Weight Bearing Restrictions: No      Mobility Bed Mobility Overal bed mobility: Needs Assistance Bed Mobility: Supine to Sit;Sit to Supine     Supine to sit: HOB elevated;Min  assist (trunk elevation, cues to bring hips EOB) Sit to supine: Mod assist (assist for legs back into bed)   General bed mobility comments: increased time and effort    Transfers Overall transfer level: Needs assistance Equipment used: Rolling walker (2 wheeled) Transfers: Sit to/from [facility] Sit to Stand: Min assist;From elevated surface Stand pivot transfers: Min assist;From elevated surface       General transfer comment: vc for safe hand placement with RW and cues for turning safely with RW, Pt with very small steps and min A for balance in standing. close min guard due to frequency of falls in home setting PTA    Balance Overall balance assessment: Needs assistance Sitting-balance support: Single extremity supported;Feet supported Sitting balance-Leahy Scale: Fair Sitting balance - Comments: heavy left lateral lean and inability to hold at mindline Postural control: Left lateral lean Standing balance support: Bilateral upper extremity supported Standing balance-Leahy Scale: Poor Standing balance comment: dependent on BUE                           ADL either performed or assessed with clinical judgement   ADL Overall ADL's : Needs assistance/impaired Eating/Feeding: Minimal assistance;Sitting   Grooming: Wash/dry face;Oral care;Minimal assistance;Sitting   Upper Body Bathing: Minimal assistance   Lower Body Bathing: Moderate assistance   Upper Body Dressing : Minimal assistance   Lower Body Dressing: Moderate assistance   Toilet Transfer: Minimal assistance;Ambulation;RW;Cueing for safety;Cueing for sequencing Toilet Transfer Details (indicate cue type and reason): very  short distance Writer and Hygiene: Moderate assistance Toileting - Clothing Manipulation Details (indicate cue type and reason): dependent on BUE in standing     Functional mobility during ADLs: Minimal assistance;Cueing for safety;Rolling  walker General ADL Comments: decreased activity tolerance, decreased balance, fatigue     Vision Baseline Vision/History: Wears glasses Wears Glasses: Reading only Patient Visual Report: No change from baseline       Perception     Praxis      Pertinent Vitals/Pain Pain Assessment: No/denies pain     Hand Dominance Right   Extremity/Trunk Assessment Upper Extremity Assessment Upper Extremity Assessment: Generalized weakness (MMT 3/5 generalized)   Lower Extremity Assessment Lower Extremity Assessment: Generalized weakness;Defer to PT evaluation   Cervical / Trunk Assessment Cervical / Trunk Assessment: Normal   Communication Communication Communication: No difficulties   Cognition Arousal/Alertness: Awake/alert Behavior During Therapy: WFL for tasks assessed/performed Overall Cognitive Status: Within Functional Limits for tasks assessed                                     General Comments  DOE 2/4 with activity - SpO2 (checked via finger pulse-ox 95%)    Exercises     Shoulder Instructions      Home Living Family/patient expects to be discharged to:: Private residence Living Arrangements: Alone Available Help at Discharge: Family;Available PRN/intermittently (Daughter) Type of Home: Apartment Home Access: Stairs to enter [employer] of Steps: 20 Entrance Stairs-Rails: Right Home Layout: One level     Bathroom Shower/Tub: Teacher, early years/pre: Standard Bathroom Accessibility: Yes How Accessible: Accessible via walker Home Equipment: [place] - 4 wheels;Shower seat          Prior Functioning/Environment Level of Independence: Needs assistance  Gait / Transfers Assistance Needed: using Rollator, mutiple falls in the past week ADL's / Homemaking Assistance Needed: increased time from seated position   Comments: assist with meal prep and groceries and cleaning; getting HHPT but doesn't seem to be working         OT Problem List: Decreased strength;Decreased range of motion;Decreased activity tolerance;Impaired balance (sitting and/or standing);Decreased safety awareness;Decreased knowledge of use of DME or AE      OT Treatment/Interventions: Self-care/ADL training;Therapeutic exercise;Energy conservation;DME and/or AE instruction;Therapeutic activities;Patient/family education;Balance training    OT Goals(Current goals can be found in the care plan section) Acute Rehab OT Goals Patient Stated Goal: get stronger and stop falling OT Goal Formulation: With patient Time For Goal Achievement: 03/07/20 Potential to Achieve Goals: Good ADL Goals Pt Will Perform Grooming: with supervision;standing Pt Will Perform Upper Body Dressing: with modified independence;sitting Pt Will Perform Lower Body Dressing: with supervision;sit to/from stand Pt Will Transfer to Toilet: with supervision;ambulating Pt Will Perform Toileting - Clothing Manipulation and hygiene: with supervision;sit to/from stand  OT Frequency: Min 2X/week   Barriers to D/C: Decreased caregiver support  does not have 24 hour support       Co-evaluation              AM-PAC OT "6 Clicks" Daily Activity     Outcome Measure Help from another person eating meals?: A Little Help from another person taking care of personal grooming?: A Little Help from another person toileting, which includes using toliet, bedpan, or urinal?: A Little Help from another person bathing (including washing, rinsing, drying)?: A Little Help from another person to put on and taking off regular  upper body clothing?: A Little Help from another person to put on and taking off regular lower body clothing?: A Lot 6 Click Score: 17   End of Session Equipment Utilized During Treatment: Gait belt;Rolling walker Nurse Communication: Mobility status  Activity Tolerance: Patient tolerated treatment well Patient left: in bed;with call bell/phone within reach;with bed  alarm set  OT Visit Diagnosis: Unsteadiness on feet (R26.81);Other abnormalities of gait and mobility (R26.89);Repeated falls (R29.6);History of falling (Z91.81);Muscle weakness (generalized) (M62.81)                Time: 9937-1696 OT Time Calculation (min): 30 min Charges:  OT General Charges $OT Visit: 1 Visit OT Evaluation $OT Eval Moderate Complexity: 1 Mod OT Treatments $Self Care/Home Management : 8-22 mins  Jesse Sans OTR/L Acute Rehabilitation Services Pager: [phone] Office: [place] 02/22/2020, 2:14 PM

## 2020-02-22 NOTE — ED Notes (Signed)
Bladder Scan 922ML

## 2020-02-22 NOTE — Evaluation (Signed)
Physical Therapy Evaluation Patient Details Name: [name] MRN: [id number] DOB: [birth year] Today's Date: 02/22/2020   History of Present Illness  [name] is a 64 y.o. female with medical history significant for vitamin B12 deficiency, tobacco abuse, depression, anxiety, arrhythmia status post pacemaker placement in December 2014, who is admitted to [facility] on 02/21/2020 with HCAP pneumonia after presenting from home to [facility] Emergency Department complaining of generalized weakness.   Clinical Impression  Pt was seen for mobility on RW with assistance to safely navigate and maintain close posture to the bed.  Her knees did not outright buckle but lists to L side and requires help to maintain balance.  Will work on midline balance control and awareness of safety with RW use; expect her to continue on to rehab for further control of gait and tolerance for stairs for home.  Pt is motivated and willing to work to get home safely as her family cannot be there with her 24/7.  Focus on strength, safe use of RW and safety education.      Follow Up Recommendations SNF    Equipment Recommendations  Rolling walker with 5" wheels    Recommendations for Other Services       Precautions / Restrictions Precautions Precautions: Fall Precaution Comments: monitor O2 sats Restrictions Weight Bearing Restrictions: No      Mobility  Bed Mobility Overal bed mobility: Needs Assistance Bed Mobility: Supine to Sit;Sit to Supine     Supine to sit: Min assist Sit to supine: Min assist        Transfers Overall transfer level: Needs assistance Equipment used: Rolling walker (2 wheeled) Transfers: Sit to/from Stand Sit to Stand: Min assist         General transfer comment: reminders for hand placement and control of sitting descent  Ambulation/Gait Ambulation/Gait assistance: Min assist;Mod assist Gait Distance (Feet): 16 Feet (4 x 4) Assistive device: Rolling  walker (2 wheeled);1 person hand held assist Gait Pattern/deviations: Step-to pattern;Decreased stride length;Wide base of support Gait velocity: reduced Gait velocity interpretation: <1.31 ft/sec, indicative of household ambulator General Gait Details: shuffling and slow but navigating the side of the bed with no outright buckling of knees  Stairs            Wheelchair Mobility    Modified Rankin (Stroke Patients Only)       Balance Overall balance assessment: History of Falls;Needs assistance Sitting-balance support: Feet supported Sitting balance-Leahy Scale: Fair   Postural control: Left lateral lean Standing balance support: Bilateral upper extremity supported Standing balance-Leahy Scale: Poor Standing balance comment: cued and supported to avoid LOB to L side                             Pertinent Vitals/Pain Pain Assessment: No/denies pain    Home Living Family/patient expects to be discharged to:: Private residence Living Arrangements: Alone Available Help at Discharge: Family;Available PRN/intermittently Type of Home: Apartment Home Access: Stairs to enter Entrance Stairs-Rails: Right Entrance Stairs-Number of Steps: 20 Home Layout: One level Home Equipment: Walker - 4 wheels;Shower seat Additional Comments: no other walking devices used    Prior Function Level of Independence: Needs assistance   Gait / Transfers Assistance Needed: rollator to walk, but able to climb stairs  ADL's / Homemaking Assistance Needed: able to dress and bathe herself  Comments: family helping with meals and housework,       Hand Dominance  Dominant Hand: Right    Extremity/Trunk Assessment   Upper Extremity Assessment Upper Extremity Assessment: Generalized weakness    Lower Extremity Assessment Lower Extremity Assessment: Generalized weakness    Cervical / Trunk Assessment Cervical / Trunk Assessment: Normal  Communication   Communication: No  difficulties  Cognition Arousal/Alertness: Awake/alert Behavior During Therapy: WFL for tasks assessed/performed Overall Cognitive Status: Within Functional Limits for tasks assessed                                        General Comments General comments (skin integrity, edema, etc.): O2 sats were checked and note small decline from baseline to standing 98 to 96%    Exercises     Assessment/Plan    PT Assessment Patient needs continued PT services  PT Problem List Decreased strength;Decreased range of motion;Decreased activity tolerance;Decreased balance;Decreased mobility;Decreased coordination;Decreased knowledge of use of DME;Decreased safety awareness;Cardiopulmonary status limiting activity       PT Treatment Interventions DME instruction;Gait training;Stair training;Functional mobility training;Therapeutic activities;Neuromuscular re-education;Balance training;Therapeutic exercise;Patient/family education    PT Goals (Current goals can be found in the Care Plan section)  Acute Rehab PT Goals Patient Stated Goal: to be safe at home PT Goal Formulation: With patient Time For Goal Achievement: 03/07/20 Potential to Achieve Goals: Good    Frequency Min 3X/week   Barriers to discharge Decreased caregiver support home alone during the day often    Co-evaluation               AM-PAC PT "6 Clicks" Mobility  Outcome Measure Help needed turning from your back to your side while in a flat bed without using bedrails?: A Little Help needed moving from lying on your back to sitting on the side of a flat bed without using bedrails?: A Little Help needed moving to and from a bed to a chair (including a wheelchair)?: A Little Help needed standing up from a chair using your arms (e.g., wheelchair or bedside chair)?: A Little Help needed to walk in hospital room?: A Lot Help needed climbing 3-5 steps with a railing? : Total 6 Click Score: 15    End of Session  Equipment Utilized During Treatment: Gait belt;Oxygen Activity Tolerance: Patient tolerated treatment well;Patient limited by fatigue;Treatment limited secondary to medical complications (Comment) Patient left: in bed;with call bell/phone within reach;with bed alarm set Nurse Communication: Mobility status PT Visit Diagnosis: Unsteadiness on feet (R26.81);Muscle weakness (generalized) (M62.81);Difficulty in walking, not elsewhere classified (R26.2)    Time: 8588-5027 PT Time Calculation (min) (ACUTE ONLY): 25 min   Charges:   PT Evaluation $PT Eval Moderate Complexity: 1 Mod PT Treatments $Gait Training: 8-22 mins       Ramond Dial 02/22/2020, 4:26 PM  Mee Hives, PT MS Acute Rehab Dept. Number: [phone] and [place] \

## 2020-02-22 NOTE — Progress Notes (Signed)
PROGRESS NOTE    [name]  RAQ:[id number] DOB: [birth year] DOA: 02/21/2020 PCP: Lujean Amel, MD    Brief Narrative:  64 y.o. 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 Admitted with concerns for PNA  Assessment & Plan:   Principal Problem:   HCAP (healthcare-associated pneumonia) Active Problems:   Tobacco abuse   B12 deficiency   Cough   Generalized weakness   GAD (generalized anxiety disorder)   #) Healthcare associated pneumonia:  -Imaging with findings suggestive of PNA -COVID and flu neg -Not septic -Pt is continued on empiric cefepime and vanc -MRSA swab is neg, thus will d/c vanc  #) Generalized weakness: -Uncertain etiology. Pt reports weakness has been present since last hospitalization and being mostly chair or bedbound -PT/OT consulted, thus far recs for SNF -CT head noted to be unremarkable -B12 levels normal. Thyroid function is normal -Will repeat cmp and cbc in AM  #) Chronic tobacco abuse -Cessation done at time of presentation -Cont nicotine patch  #) Vitamin B12 deficiency: -Vit B12 levels noted to be normal -methylmalonic acid level is pending  #) Generalized anxiety disorder: -continued on PRN xanax -Seems to be stable at this time  #) Bipolar disorder:  -Noted to be on scheduled Risperdone as an outpatient, but otherwise denied any pharmacologic intervention for bipolar at this time. -Stable  DVT prophylaxis: Lovenox subq Code Status: Full Family Communication: Pt in room, family not at bedside  Status is: Inpatient  Remains inpatient appropriate because:Unsafe d/c plan   Dispo: The patient is from: Home               Anticipated d/c is to: SNF              Anticipated d/c date is: 2 days              Patient currently is not medically stable to d/c.   Consultants:     Procedures:     Antimicrobials: Anti-infectives (From admission, onward)   Start     Dose/Rate Route Frequency Ordered Stop   02/22/20 0900  vancomycin (VANCOREADY) IVPB 750 mg/150 mL       "Followed by" Linked Group Details   750 mg 150 mL/hr over 60 Minutes Intravenous Every 12 hours 02/21/20 2033     02/21/20 2200  ceFEPIme (MAXIPIME) 2 g in sodium chloride 0.9 % 100 mL IVPB        2 g 200 mL/hr over 30 Minutes Intravenous Every 8 hours 02/21/20 2033     02/21/20 2045  vancomycin (VANCOREADY) IVPB 1250 mg/250 mL       "Followed by" Linked Group Details   1,250 mg 166.7 mL/hr over 90 Minutes Intravenous  Once 02/21/20 2033 02/21/20 2319   02/21/20 1930  levofloxacin (LEVAQUIN) IVPB 750 mg  Status:  Discontinued        750 mg 100 mL/hr over 90 Minutes Intravenous  Once 02/21/20 1922 02/21/20 2031       Subjective: Complaining of continued B LE weakness  Objective: Vitals:   02/22/20 0437 02/22/20 0437 02/22/20 0450 02/22/20 1338  BP:  119/80  107/71  Pulse:  68  71  Resp:  18  18  Temp:  98 F (36.7 C)  98.3 F (36.8  C)  TempSrc:  Oral    SpO2:  98%  99%  Weight:   54 kg   Height: 5\' 7"  (1.702 m)       Intake/Output Summary (Last 24 hours) at 02/22/2020 1557 Last data filed at 02/22/2020 1447 Gross per 24 hour  Intake 2425.48 ml  Output 1050 ml  Net 1375.48 ml   Filed Weights   02/22/20 0450  Weight: 54 kg    Examination:  General exam: Appears calm and comfortable  Respiratory system: Clear to auscultation. Respiratory effort normal. Cardiovascular system: S1 & S2 heard, Regular Gastrointestinal system: Abdomen is nondistended, soft and nontender. No organomegaly or masses felt. Normal bowel sounds heard. Central nervous system: Alert and oriented. Initially, pt was able to lift both legs off bed  with mild resistance, however when asked to do again, seemed to be unable to lift off the bed Extremities: Symmetric 5 x 5 power. Skin: No rashes, lesions Psychiatry: Judgement and insight appear normal. Mood & affect appropriate.   Data Reviewed: I have personally reviewed following labs and imaging studies  CBC: Recent Labs  Lab 02/21/20 1618 02/22/20 0536  WBC 7.9 7.0  NEUTROABS 5.1 4.4  HGB 13.0 12.4  HCT 42.4 37.7  MCV 91.6 89.8  PLT 235 270   Basic Metabolic Panel: Recent Labs  Lab 02/21/20 1618 02/22/20 0748  NA 141 142  K 3.5 3.6  CL 108 112*  CO2 23 21*  GLUCOSE 87 82  BUN 10 6*  CREATININE 0.81 0.71  CALCIUM 9.1 8.6*  MG 2.0 1.7   GFR: Estimated Creatinine Clearance: 62.2 mL/min (by C-G formula based on SCr of 0.71 mg/dL). Liver Function Tests: Recent Labs  Lab 02/21/20 1618 02/22/20 0748  AST 12* 11*  ALT 10 8  ALKPHOS 45 35*  BILITOT 1.3* 1.6*  PROT 5.5* 4.7*  ALBUMIN 3.5 2.8*   No results for input(s): LIPASE, AMYLASE in the last 168 hours. No results for input(s): AMMONIA in the last 168 hours. Coagulation Profile: No results for input(s): INR, PROTIME in the last 168 hours. Cardiac Enzymes: No results for input(s): CKTOTAL, CKMB, CKMBINDEX, TROPONINI in the last 168 hours. BNP (last 3 results) No results for input(s): PROBNP in the last 8760 hours. HbA1C: No results for input(s): HGBA1C in the last 72 hours. CBG: No results for input(s): GLUCAP in the last 168 hours. Lipid Profile: No results for input(s): CHOL, HDL, LDLCALC, TRIG, CHOLHDL, LDLDIRECT in the last 72 hours. Thyroid Function Tests: Recent Labs    02/22/20 0748  TSH 1.565   Anemia Panel: No results for input(s): VITAMINB12, FOLATE, FERRITIN, TIBC, IRON, RETICCTPCT in the last 72 hours. Sepsis Labs: Recent Labs  Lab 02/21/20 1614  LATICACIDVEN 1.8    Recent Results (from the past 240 hour(s))  Culture, blood (routine x 2)     Status: None (Preliminary result)    Collection Time: 02/21/20  4:00 PM   Specimen: BLOOD LEFT ARM  Result Value Ref Range Status   Specimen Description BLOOD LEFT ARM  Final   Special Requests   Final    BOTTLES DRAWN AEROBIC AND ANAEROBIC Blood Culture results may not be optimal due to an inadequate volume of blood received in culture bottles   Culture   Final    NO GROWTH < 24 HOURS Performed at [facility], [place] [address] [place], [place] [address]    Report Status PENDING  Incomplete  Culture, blood (routine x 2)  Status: None (Preliminary result)   Collection Time: 02/21/20  4:04 PM   Specimen: BLOOD RIGHT ARM  Result Value Ref Range Status   Specimen Description BLOOD RIGHT ARM  Final   Special Requests   Final    BOTTLES DRAWN AEROBIC AND ANAEROBIC Blood Culture results may not be optimal due to an inadequate volume of blood received in culture bottles   Culture   Final    NO GROWTH < 24 HOURS Performed at [facility], [address], [place], [place] [address]    Report Status PENDING  Incomplete  Resp Panel by RT-PCR (Flu A&B, Covid) Nasopharyngeal Swab     Status: None   Collection Time: 02/21/20  4:05 PM   Specimen: Nasopharyngeal Swab; Nasopharyngeal(NP) swabs in vial transport medium  Result Value Ref Range Status   SARS Coronavirus 2 by RT PCR NEGATIVE NEGATIVE Final    Comment: (NOTE) SARS-CoV-2 target nucleic acids are NOT DETECTED.  The SARS-CoV-2 RNA is generally detectable in upper respiratory specimens during the acute phase of infection. The lowest concentration of SARS-CoV-2 viral copies this assay can detect is 138 copies/mL. A negative result does not preclude SARS-Cov-2 infection and should not be used as the sole basis for treatment or other patient management decisions. A negative result may occur with  improper specimen collection/handling, submission of specimen other than nasopharyngeal swab, presence of viral mutation(s) within the areas targeted by this assay, and  inadequate number of viral copies(<138 copies/mL). A negative result must be combined with clinical observations, patient history, and epidemiological information. The expected result is Negative.  Fact Sheet for Patients:  EntrepreneurPulse.com.au  Fact Sheet for Healthcare Providers:  IncredibleEmployment.be  This test is no t yet approved or cleared by the [place] FDA and  has been authorized for detection and/or diagnosis of SARS-CoV-2 by FDA under an Emergency Use Authorization (EUA). This EUA will remain  in effect (meaning this test can be used) for the duration of the COVID-19 declaration under Section 564(b)(1) of the Act, 21 U.S.C.section 360bbb-3(b)(1), unless the authorization is terminated  or revoked sooner.       Influenza A by PCR NEGATIVE NEGATIVE Final   Influenza B by PCR NEGATIVE NEGATIVE Final    Comment: (NOTE) The Xpert Xpress SARS-CoV-2/FLU/RSV plus assay is intended as an aid in the diagnosis of influenza from Nasopharyngeal swab specimens and should not be used as a sole basis for treatment. Nasal washings and aspirates are unacceptable for Xpert Xpress SARS-CoV-2/FLU/RSV testing.  Fact Sheet for Patients: EntrepreneurPulse.com.au  Fact Sheet for Healthcare Providers: IncredibleEmployment.be  This test is not yet approved or cleared by the [place] FDA and has been authorized for detection and/or diagnosis of SARS-CoV-2 by FDA under an Emergency Use Authorization (EUA). This EUA will remain in effect (meaning this test can be used) for the duration of the COVID-19 declaration under Section 564(b)(1) of the Act, 21 U.S.C. section 360bbb-3(b)(1), unless the authorization is terminated or revoked.  Performed at [facility], [place] [address] [place], [place] [address]   MRSA PCR Screening     Status: None   Collection Time: 02/22/20  4:53 AM   Specimen: Nasal  Mucosa; Nasopharyngeal  Result Value Ref Range Status   MRSA by PCR NEGATIVE NEGATIVE Final    Comment:        The GeneXpert MRSA Assay (FDA approved for NASAL specimens only), is one component of a comprehensive MRSA colonization surveillance program. It is not  intended to diagnose MRSA infection nor to guide or monitor treatment for MRSA infections. Performed at [facility], [place] [address] [place], [place] [address]      Radiology Studies: DG Chest 2 View  Result Date: 02/21/2020 CLINICAL DATA:  Altered mental status. EXAM: CHEST - 2 VIEW COMPARISON:  01/01/2020 FINDINGS: Right lung clear. There is retrocardiac left base atelectasis or infiltrate. Cardiopericardial silhouette is at upper limits of normal for size. The visualized bony structures of the thorax show no acute abnormality. Telemetry leads overlie the chest. IMPRESSION: Retrocardiac atelectasis or infiltrate. Electronically Signed   By: Misty Stanley M.D.   On: 02/21/2020 15:30   CT Head Wo Contrast  Result Date: 02/21/2020 CLINICAL DATA:  Mental status change of unknown cause. EXAM: CT HEAD WITHOUT CONTRAST TECHNIQUE: Contiguous axial images were obtained from the base of the skull through the vertex without intravenous contrast. COMPARISON:  May 26, 2019 FINDINGS: Brain: No subdural, epidural, or subarachnoid hemorrhage. Ventricles and sulci are stable. No acute cortical ischemia infarct. No mass effect or midline shift. Cerebellum, brainstem, and basal cisterns are unremarkable. Vascular: No hyperdense vessel or unexpected calcification. Skull: Normal. Negative for fracture or focal lesion. Sinuses/Orbits: No acute finding. Other: None. IMPRESSION: No acute intracranial abnormalities. Electronically Signed   By: Dorise Bullion III M.D   On: 02/21/2020 16:45   CT Chest Wo Contrast  Result Date: 02/21/2020 CLINICAL DATA:  Weakness, hypotension, multiple falls EXAM: CT CHEST WITHOUT CONTRAST TECHNIQUE: Multidetector  CT imaging of the chest was performed following the standard protocol without IV contrast. COMPARISON:  02/21/2020, 04/20/2019 FINDINGS: Cardiovascular: Heart is unremarkable without pericardial effusion. Normal caliber of the thoracic aorta. Minimal atherosclerosis of the aortic arch. Dual lead pacemaker unchanged. Mediastinum/Nodes: No enlarged mediastinal or axillary lymph nodes. Thyroid gland, trachea, and esophagus demonstrate no significant findings. Lungs/Pleura: Left lower lobe consolidation is identified, corresponding to chest x-ray findings. Favor left lower lobe bronchopneumonia over atelectasis. Mild background emphysema. No effusion or pneumothorax. The central airways are patent. Upper Abdomen: No acute abnormality. Musculoskeletal: No acute or destructive bony lesions. Reconstructed images demonstrate no additional findings. IMPRESSION: 1. Left lower lobe consolidation favor bronchopneumonia. 2. Aortic Atherosclerosis (ICD10-I70.0) and Emphysema (ICD10-J43.9). Electronically Signed   By: Randa Ngo M.D.   On: 02/21/2020 19:05    Scheduled Meds: . enoxaparin (LOVENOX) injection  40 mg Subcutaneous Q24H  . risperiDONE  3 mg Oral QHS  . vitamin B-12  500 mcg Oral Daily   Continuous Infusions: . ceFEPime (MAXIPIME) IV 2 g (02/22/20 1345)  . vancomycin 750 mg (02/22/20 1051)     LOS: 1 day   Marylu Lund, MD [facility] Pager On Amion  If 7PM-7AM, please contact night-coverage 02/22/2020, 3:57 PM

## 2020-02-23 LAB — COMPREHENSIVE METABOLIC PANEL
ALT: 7 U/L (ref 0–44)
AST: 9 U/L — ABNORMAL LOW (ref 15–41)
Albumin: 2.6 g/dL — ABNORMAL LOW (ref 3.5–5.0)
Alkaline Phosphatase: 39 U/L (ref 38–126)
Anion gap: 10 (ref 5–15)
BUN: 6 mg/dL — ABNORMAL LOW (ref 8–23)
CO2: 20 mmol/L — ABNORMAL LOW (ref 22–32)
Calcium: 8.6 mg/dL — ABNORMAL LOW (ref 8.9–10.3)
Chloride: 110 mmol/L (ref 98–111)
Creatinine, Ser: 0.64 mg/dL (ref 0.44–1.00)
Glucose, Bld: 86 mg/dL (ref 70–99)
Potassium: 3.4 mmol/L — ABNORMAL LOW (ref 3.5–5.1)
Sodium: 140 mmol/L (ref 135–145)
Total Bilirubin: 1.3 mg/dL — ABNORMAL HIGH (ref 0.3–1.2)
Total Protein: 4.5 g/dL — ABNORMAL LOW (ref 6.5–8.1)

## 2020-02-23 LAB — CBC
HCT: 35.7 % — ABNORMAL LOW (ref 36.0–46.0)
Hemoglobin: 11.3 g/dL — ABNORMAL LOW (ref 12.0–15.0)
MCH: 28.3 pg (ref 26.0–34.0)
MCHC: 31.7 g/dL (ref 30.0–36.0)
MCV: 89.3 fL (ref 80.0–100.0)
Platelets: 196 10*3/uL (ref 150–400)
RBC: 4 MIL/uL (ref 3.87–5.11)
RDW: 14.8 % (ref 11.5–15.5)
WBC: 5.8 10*3/uL (ref 4.0–10.5)
nRBC: 0 % (ref 0.0–0.2)

## 2020-02-23 MED ORDER — POTASSIUM CHLORIDE CRYS ER 20 MEQ PO TBCR
60.0000 meq | EXTENDED_RELEASE_TABLET | Freq: Once | ORAL | Status: AC
  Administered 2020-02-23: 60 meq via ORAL
  Filled 2020-02-23: qty 3

## 2020-02-23 NOTE — Evaluation (Signed)
Clinical/Bedside Swallow Evaluation Patient Details  Name: ASHIYA KINKEAD MRN: [id number] Date of Birth: Feb 06, 1956  Today's Date: 02/23/2020 Time: SLP Start Time (ACUTE ONLY): 0909 SLP Stop Time (ACUTE ONLY): 0935 SLP Time Calculation (min) (ACUTE ONLY): 26 min  Past Medical History:  Past Medical History:  Diagnosis Date  . Anxiety    anxiety and mild depressive order systoms  . Bipolar disorder ([place])     (02/20/2013)  . Cervical cancer ([place]) 03/19/1980  . Depression   . Dysrhythmia   . PIRJJOAC(166.0)    "monthly" (02/20/2013)  . Pacemaker    medtronic  . PONV (postoperative nausea and vomiting)   . Sinus pause 02/14/2013  . Syncope and collapse    echo-April 12,2012-EF 55% Echo normal; recorder with prolonged sinus pauses --> status post  . Tobacco abuse   . Vertigo    Past Surgical History:  Past Surgical History:  Procedure Laterality Date  . ABDOMINAL HYSTERECTOMY  03/19/1980   "partial"  . BACK SURGERY    . CERVICAL FUSION Left 11/2013   C4-C6  . CERVICAL FUSION     3,4,5,  . INSERT / REPLACE / REMOVE PACEMAKER  02/20/2013   medtronic  . LOOP RECORDER IMPLANT N/A 11/06/2012   Procedure: LINQ LOOP RECORDER IMPLANT;  Surgeon: Sanda Klein, MD;  Location: [facility] CATH [facility];  Service: Cardiovascular;  Laterality: N/A;  . NECK SURGERY  11/17/2013  . PACEMAKER PLACEMENT N/A 02/16/2013  . PERMANENT PACEMAKER INSERTION N/A 02/20/2013   Procedure: PERMANENT PACEMAKER INSERTION;  Surgeon: Sanda Klein, MD;  Location: [facility] C[facility] LAB;  Service: Cardiovascular;  Laterality: N/A;  . POSTERIOR LUMBAR FUSION  ~ 2009  . ROBOTIC ASSISTED SALPINGO OOPHERECTOMY Bilateral 11/25/2018   Procedure: XI ROBOTIC ASSISTED BILATERAL SALPINGO OOPHORECTOMY and LYSIS OF ADHESIONS.;  Surgeon: Everitt Amber, MD;  Location: [facility] OR[facility];  Service: Gynecology;  Laterality: Bilateral;  . SPINAL FUSION  03/19/2014  . TONSILLECTOMY    . TRANSTHORACIC ECHOCARDIOGRAM  07/11/2010   The left atrial size is normal.There is  no evidence of mitral vavle prolaspe.Right ventriicular systolic pressure is normal . Injection of contrast documented no interatrial shunt. Essentially normal 2D echo -doppler study    HPI:  64 y.o. 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 Admitted with concerns for PNA.  According to her nurse, the evening of 12/6 she had frequent coughing episodes while eating soup, hence SLP swallow eval was ordered. Upon chart review, pt was seen by [facility],  Dr. Felecia Shelling, on 11/22.  He ordered an OP MBS which has been scheduled for 12/13 at [facility]. She has had progressive generalized weakness the last two months, as well as worsening swallow function.  There were concerns re: r/o myasthenia gravis and w/u was underway.  She is scheduled for a neuro f/u in six months.    Assessment / Plan / Recommendation Clinical Impression  Pt presents with functional overall swallowing with no overt s/s of aspiration, however swallow response appears to be subjectively delayed and cranial nerve exam revealed focal deficits that are concerning for potential bulbar involvement.  There is bilateral weakness palpable in masseter and temporalis muscles (CN V). There is reduced strength in tongue when asked to lateralize or protrude against resistance  (CN XII).  Tongue at  rest appears to have subtle fasciculations.  DDK with repetitions of /puh/, /tuh/, /kuh/ reveal temporal and intensity irregularities, and when assembling the three sounds in sequence, there are inaccurate productions.  All of these signs, while not necessarily indicative of a dysphagia, are outside of the range of normal and would suggest the need for  further neurological work-up, ideally sooner than the six-month range that was recommended.  Discussed with Dr. Wyline Copas.  Will proceed with MBS while pt is admitted to identify any pharyngeal pathophysiology.  Continue current diet pending this study. D/W Ms. Mayeux, who agrees with plan.        SLP Visit Diagnosis: Dysphagia, unspecified (R13.10)    Aspiration Risk  No limitations    Diet Recommendation     Medication Administration: Crushed with puree    Other  Recommendations Oral Care Recommendations: Oral care BID   Follow up Recommendations        Frequency and Duration            Prognosis        Swallow Study   General HPI: 64 y.o. 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 Admitted with concerns for PNA.  According to her nurse, the evening of 12/6 she had frequent coughing episodes while eating soup, hence SLP swallow eval was ordered. Upon chart review, pt was seen by [facility],  Dr. Felecia Shelling, on 11/22.  He ordered an OP MBS which has been scheduled for 12/13 at [facility]. She has had progressive generalized weakness the last two months, as well as worsening swallow function.  There were concerns re: r/o myasthenia gravis and w/u was underway.  She is scheduled for a neuro f/u in six months.  Type of Study: MBS-Modified Barium Swallow Study Previous Swallow Assessment: no Diet Prior to this Study: Regular;Thin liquids Temperature Spikes Noted: No Respiratory Status: Room air History of Recent Intubation: No Behavior/Cognition: Alert;Cooperative Oral Cavity Assessment: Within Functional Limits Oral Care Completed by SLP: No Oral Cavity - Dentition: Adequate  natural dentition Vision: Functional for self-feeding Patient Positioning: Upright in bed Baseline Vocal Quality: Normal Volitional Cough: Strong Volitional Swallow: Able to elicit    Oral/Motor/Sensory Function Overall Oral Motor/Sensory Function: Generalized oral weakness (see clinical impressions) Facial Strength:  (bilateral weakness masseters)   Ice Chips Ice chips: Within functional limits   Thin Liquid Thin Liquid: Within functional limits    Nectar Thick Nectar Thick Liquid: Not tested   Honey Thick Honey Thick Liquid: Not tested   Puree Puree: Within functional limits   Solid     Solid: Within functional limits      Juan Quam Laurice 02/23/2020,9:57 AM  Estill Bamberg L. Tivis Ringer, [place] Office number [phone] Pager [phone]

## 2020-02-23 NOTE — Progress Notes (Signed)
      RE:   [name]      Date of Birth:  04-03-2055     Date:  02/23/20        To Whom It May Concern:  Please be advised that the above-named patient will require a short-term nursing home stay - anticipated 30 days or less for rehabilitation and strengthening.  The plan is for return home.                 MD signature                Date

## 2020-02-23 NOTE — Plan of Care (Signed)
?  Problem: Education: ?Goal: Knowledge of General Education information will improve ?Description: Including pain rating scale, medication(s)/side effects and non-pharmacologic comfort measures ?Outcome: Progressing ?  ?Problem: Health Behavior/Discharge Planning: ?Goal: Ability to manage health-related needs will improve ?Outcome: Progressing ?  ?Problem: Clinical Measurements: ?Goal: Ability to maintain clinical measurements within normal limits will improve ?Outcome: Progressing ?Goal: Will remain free from infection ?Outcome: Progressing ?Goal: Diagnostic test results will improve ?Outcome: Progressing ?Goal: Respiratory complications will improve ?Outcome: Progressing ?Goal: Cardiovascular complication will be avoided ?Outcome: Progressing ?  ?Problem: Coping: ?Goal: Level of anxiety will decrease ?Outcome: Progressing ?  ?Problem: Pain Managment: ?Goal: General experience of comfort will improve ?Outcome: Progressing ?  ?Problem: Safety: ?Goal: Ability to remain free from injury will improve ?Outcome: Progressing ?  ?Problem: Skin Integrity: ?Goal: Risk for impaired skin integrity will decrease ?Outcome: Progressing ?  ?

## 2020-02-23 NOTE — Plan of Care (Signed)
  Problem: Education: Goal: Knowledge of General Education information will improve Description Including pain rating scale, medication(s)/side effects and non-pharmacologic comfort measures Outcome: Progressing   

## 2020-02-23 NOTE — Plan of Care (Signed)

## 2020-02-23 NOTE — [place] FL2 (Signed)
[place] MEDICAID FL2 LEVEL OF CARE SCREENING TOOL     IDENTIFICATION  Patient Name: [name] Birthdate: 11-28-1955 Sex: female Admission Date (Current Location): 02/21/2020  [facility] and [place] Number:  Herbalist and Address:  [facility], [place] [address], [place], [place] [address]      Provider Number: [id number]  Attending Physician Name and Address:  Donne Hazel, MD  Relative Name and Phone Number:  MARSHE, SHRESTHA Daughter [phone]    Current Level of Care: Hospital Recommended Level of Care: [place] Prior Approval Number:    Date Approved/Denied:   PASRR Number:    Discharge Plan: SNF    Current Diagnoses: Patient Active Problem List   Diagnosis Date Noted  . HCAP (healthcare-associated pneumonia) 02/21/2020  . Cough 02/21/2020  . Generalized weakness 02/21/2020  . GAD (generalized anxiety disorder) 02/21/2020  . Proximal muscle weakness 02/08/2020  . Dysphagia 02/08/2020  . Gait disturbance 01/04/2020  . Elevated CK 01/04/2020  . Weakness of both lower extremities 01/04/2020  . Encephalopathy   . Polypharmacy   . CAP (community acquired pneumonia) 12/18/2019  . Fall 12/18/2019  . Hypokalemia   . Abnormal liver function test   . Cervical spondylosis 09/23/2019  . Bilateral elbow joint pain 08/26/2019  . Memory loss 07/02/2019  . Depression with anxiety 07/02/2019  . B12 deficiency 07/02/2019  . Lumbar radiculopathy 02/10/2019  . History of fusion of lumbar spine 02/10/2019  . Dysesthesia 02/10/2019  . Retroperitoneal fibrosis   . Pelvic adhesions   . Left ovarian cyst 11/17/2018  . Elevated tumor markers 11/17/2018  . Dyspnea 09/15/2018  . Chest pain of uncertain etiology 17/91/5056  . Laryngopharyngeal reflux (LPR) 05/19/2018  . Mood disorder ([place]) 04/16/2018  . Insomnia 04/16/2018  . Attention deficit hyperactivity disorder (ADHD) 04/16/2018  . Neck pain on right side 09/09/2014   . 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 Family history of arrhythmogenic right ventricular cardiomyopathy 03/30/2013  . 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 02/20/2013  . Sinus pause 02/14/2013  . Hx of syncope- s/p Loop recorder 11/06/12 10/29/2012  . Vertigo 10/29/2012  . Tobacco abuse 10/29/2012  . ANXIETY 11/14/2009  . CERUMEN IMPACTION, RIGHT 11/14/2009  . BENIGN POSITIONAL VERTIGO 11/14/2009  . EAR PAIN, RIGHT 11/14/2009  . SCIATICA 05/09/2009    Orientation RESPIRATION BLADDER Height & Weight     Self, Time, Situation, Place  Normal Continent Weight: 119 lb 0.8 oz (54 kg) Height:  5\' 7"  (170.2 cm)  BEHAVIORAL SYMPTOMS/MOOD NEUROLOGICAL BOWEL NUTRITION STATUS      Continent Diet (Regular diet.  See discharge summary.)  AMBULATORY STATUS COMMUNICATION OF NEEDS Skin   Limited Assist Verbally Skin abrasions                       Personal Care Assistance Level of Assistance  Bathing, Feeding, Dressing Bathing Assistance: Limited assistance Feeding assistance: Limited assistance Dressing Assistance: Limited assistance     Functional Limitations Info  Sight, Hearing, Speech Sight Info: Adequate Hearing Info: Adequate Speech Info: Adequate    SPECIAL CARE FACTORS FREQUENCY  PT (By licensed PT), OT (By licensed OT), Speech therapy     PT Frequency: 5x week OT Frequency: 5x week     Speech Therapy Frequency: no recommendation      Contractures Contractures Info: Not present    Additional Factors Info  Code Status, Allergies Code Status Info: full Allergies Info: penecillings  Current Medications (02/23/2020):  This is the current hospital active medication list Current Facility-Administered Medications  Medication Dose Route Frequency Provider Last Rate Last Admin  . acetaminophen (TYLENOL) tablet 650 mg  650 mg Oral Q6H PRN Howerter, Justin B, DO       Or  . acetaminophen (TYLENOL) suppository 650 mg  650 mg Rectal  Q6H PRN Howerter, Justin B, DO      . albuterol (VENTOLIN HFA) 108 (90 Base) MCG/ACT inhaler 1-2 puff  1-2 puff Inhalation Q4H PRN Howerter, Justin B, DO      . ALPRAZolam Duanne Moron) tablet 1 mg  1 mg Oral BID PRN Howerter, Justin B, DO   1 mg at 02/23/20 0159  . ceFEPIme (MAXIPIME) 2 g in sodium chloride 0.9 % 100 mL IVPB  2 g Intravenous Q8H Duanne Limerick, RPH 200 mL/hr at 02/23/20 0423 2 g at 02/23/20 0423  . cyclobenzaprine (FLEXERIL) tablet 10 mg  10 mg Oral QHS PRN Howerter, Justin B, DO      . enoxaparin (LOVENOX) injection 40 mg  40 mg Subcutaneous Q24H Howerter, Justin B, DO   40 mg at 02/23/20 0921  . nicotine (NICODERM CQ - dosed in mg/24 hours) patch 21 mg  21 mg Transdermal Daily PRN Howerter, Justin B, DO      . ondansetron (ZOFRAN) injection 4 mg  4 mg Intravenous Q6H PRN Howerter, Justin B, DO      . risperiDONE (RISPERDAL) tablet 3 mg  3 mg Oral QHS Howerter, Justin B, DO   3 mg at 02/22/20 2017  . vitamin B-12 (CYANOCOBALAMIN) tablet 500 mcg  500 mcg Oral Daily Howerter, Justin B, DO   500 mcg at 02/23/20 0932     Discharge Medications: Please see discharge summary for a list of discharge medications.  Relevant Imaging Results:  Relevant Lab Results:   Additional Information SSN [id number]  Joanne Chars, LCSW

## 2020-02-23 NOTE — Progress Notes (Signed)
PROGRESS NOTE    [name]  UKG:[id number] DOB: Nov 01, 1955 DOA: 02/21/2020 PCP: Lujean Amel, MD    Brief Narrative:  64 y.o. female with medical history significant for vitamin B12 deficiency, tobacco abuse, depression, anxiety, arrhythmia status post pacemaker placement in December 2014, who is admitted to [facility] on 02/21/2020 with HCAP pneumonia after presenting from home to [facility] Emergency Department complaining of generalized weakness. Admitted with concerns for PNA  Assessment & Plan:   Principal Problem:   HCAP (healthcare-associated pneumonia) Active Problems:   Tobacco abuse   B12 deficiency   Cough   Generalized weakness   GAD (generalized anxiety disorder)   #) Healthcare associated pneumonia:  -Imaging with findings suggestive of PNA -COVID and flu neg -Not septic -Pt is continued on empiric cefepime -MRSA swab is neg -Afebrile with no leukocytosis  #) Generalized weakness: -Uncertain etiology. Pt reports weakness has been present since last hospitalization and being mostly chair or bedbound -PT/OT consulted, thus far recs for SNF -CT head noted to be unremarkable -B12 levels normal. Thyroid function is normal -Appreciate input by SLP. Concerns for possible bulbar involvement during SLP exam. Of note, pt is followed by Dr. Felecia Shelling for progressive weakness. Outpt MG work-up was neg. Pt was due to f/u in 6 months for nerve conduction study. Have consulted Neuro  #) Chronic tobacco abuse -Cessation done at time of presentation -Cont nicotine patch as tolerated  #) Vitamin B12 deficiency: -Vit B12 levels noted to be normal -methylmalonic acid level is pending  #) Generalized anxiety disorder: -continued on PRN xanax -Seems to be stable currently  #) Bipolar disorder:  -Noted to be on scheduled Risperdone as an outpatient, but otherwise denied any pharmacologic intervention for bipolar at this time. -Stable at this  time  DVT prophylaxis: Lovenox subq Code Status: Full Family Communication: Pt in room, family not at bedside  Status is: Inpatient  Remains inpatient appropriate because:Unsafe d/c plan   Dispo: The patient is from: Home              Anticipated d/c is to: SNF              Anticipated d/c date is: 2 days              Patient currently is not medically stable to d/c.   Consultants:     Procedures:     Antimicrobials: Anti-infectives (From admission, onward)   Start     Dose/Rate Route Frequency Ordered Stop   02/22/20 0900  vancomycin (VANCOREADY) IVPB 750 mg/150 mL  Status:  Discontinued       "Followed by" Linked Group Details   750 mg 150 mL/hr over 60 Minutes Intravenous Every 12 hours 02/21/20 2033 02/22/20 1615   02/21/20 2200  ceFEPIme (MAXIPIME) 2 g in sodium chloride 0.9 % 100 mL IVPB        2 g 200 mL/hr over 30 Minutes Intravenous Every 8 hours 02/21/20 2033     02/21/20 2045  vancomycin (VANCOREADY) IVPB 1250 mg/250 mL       "Followed by" Linked Group Details   1,250 mg 166.7 mL/hr over 90 Minutes Intravenous  Once 02/21/20 2033 02/21/20 2319   02/21/20 1930  levofloxacin (LEVAQUIN) IVPB 750 mg  Status:  Discontinued        750 mg 100 mL/hr over 90 Minutes Intravenous  Once 02/21/20 1922 02/21/20 2031      Subjective: Still feeling marked LE weakness  Objective: Vitals:  02/22/20 0450 02/22/20 1338 02/22/20 2006 02/23/20 1415  BP:  107/71 104/68 93/62  Pulse:  71 65 61  Resp:  18 14 16   Temp:  98.3 F (36.8 C) 99.2 F (37.3 C) 97.6 F (36.4 C)  TempSrc:   Oral   SpO2:  99% 99% 97%  Weight: 54 kg     Height:        Intake/Output Summary (Last 24 hours) at 02/23/2020 1603 Last data filed at 02/22/2020 1800 Gross per 24 hour  Intake 150 ml  Output 150 ml  Net 0 ml   Filed Weights   02/22/20 0450  Weight: 54 kg    Examination: General exam: Awake, laying in bed, in nad Respiratory system: Normal respiratory effort, no  wheezing Cardiovascular system: regular rate, s1, s2 Gastrointestinal system: Soft, nondistended, positive BS Central nervous system: CN2-12 grossly intact, marked BLE weakness barely able to lift legs off bed Extremities: Perfused, no clubbing Skin: Normal skin turgor, no notable skin lesions seen Psychiatry: Mood normal // no visual hallucinations   Data Reviewed: I have personally reviewed following labs and imaging studies  CBC: Recent Labs  Lab 02/21/20 1618 02/22/20 0536 02/23/20 0152  WBC 7.9 7.0 5.8  NEUTROABS 5.1 4.4  --   HGB 13.0 12.4 11.3*  HCT 42.4 37.7 35.7*  MCV 91.6 89.8 89.3  PLT 235 193 829   Basic Metabolic Panel: Recent Labs  Lab 02/21/20 1618 02/22/20 0748 02/23/20 0152  NA 141 142 140  K 3.5 3.6 3.4*  CL 108 112* 110  CO2 23 21* 20*  GLUCOSE 87 82 86  BUN 10 6* 6*  CREATININE 0.81 0.71 0.64  CALCIUM 9.1 8.6* 8.6*  MG 2.0 1.7  --    GFR: Estimated Creatinine Clearance: 62.2 mL/min (by C-G formula based on SCr of 0.64 mg/dL). Liver Function Tests: Recent Labs  Lab 02/21/20 1618 02/22/20 0748 02/23/20 0152  AST 12* 11* 9*  ALT 10 8 7   ALKPHOS 45 35* 39  BILITOT 1.3* 1.6* 1.3*  PROT 5.5* 4.7* 4.5*  ALBUMIN 3.5 2.8* 2.6*   No results for input(s): LIPASE, AMYLASE in the last 168 hours. No results for input(s): AMMONIA in the last 168 hours. Coagulation Profile: No results for input(s): INR, PROTIME in the last 168 hours. Cardiac Enzymes: No results for input(s): CKTOTAL, CKMB, CKMBINDEX, TROPONINI in the last 168 hours. BNP (last 3 results) No results for input(s): PROBNP in the last 8760 hours. HbA1C: No results for input(s): HGBA1C in the last 72 hours. CBG: No results for input(s): GLUCAP in the last 168 hours. Lipid Profile: No results for input(s): CHOL, HDL, LDLCALC, TRIG, CHOLHDL, LDLDIRECT in the last 72 hours. Thyroid Function Tests: Recent Labs    02/22/20 0748  TSH 1.565   Anemia Panel: No results for input(s):  VITAMINB12, FOLATE, FERRITIN, TIBC, IRON, RETICCTPCT in the last 72 hours. Sepsis Labs: Recent Labs  Lab 02/21/20 1614  LATICACIDVEN 1.8    Recent Results (from the past 240 hour(s))  Culture, blood (routine x 2)     Status: None (Preliminary result)   Collection Time: 02/21/20  4:00 PM   Specimen: BLOOD LEFT ARM  Result Value Ref Range Status   Specimen Description BLOOD LEFT ARM  Final   Special Requests   Final    BOTTLES DRAWN AEROBIC AND ANAEROBIC Blood Culture results may not be optimal due to an inadequate volume of blood received in culture bottles   Culture   Final  NO GROWTH 2 DAYS Performed at [facility], [place] [address] [place], [place] [address]    Report Status PENDING  Incomplete  Culture, blood (routine x 2)     Status: None (Preliminary result)   Collection Time: 02/21/20  4:04 PM   Specimen: BLOOD RIGHT ARM  Result Value Ref Range Status   Specimen Description BLOOD RIGHT ARM  Final   Special Requests   Final    BOTTLES DRAWN AEROBIC AND ANAEROBIC Blood Culture results may not be optimal due to an inadequate volume of blood received in culture bottles   Culture   Final    NO GROWTH 2 DAYS Performed at [facility], [place] [address], [place], [place] [address]    Report Status PENDING  Incomplete  Resp Panel by RT-PCR (Flu A&B, Covid) Nasopharyngeal Swab     Status: None   Collection Time: 02/21/20  4:05 PM   Specimen: Nasopharyngeal Swab; Nasopharyngeal(NP) swabs in vial transport medium  Result Value Ref Range Status   SARS Coronavirus 2 by RT PCR NEGATIVE NEGATIVE Final    Comment: (NOTE) SARS-CoV-2 target nucleic acids are NOT DETECTED.  The SARS-CoV-2 RNA is generally detectable in upper respiratory specimens during the acute phase of infection. The lowest concentration of SARS-CoV-2 viral copies this assay can detect is 138 copies/mL. A negative result does not preclude SARS-Cov-2 infection and should not be used as the sole basis for  treatment or other patient management decisions. A negative result may occur with  improper specimen collection/handling, submission of specimen other than nasopharyngeal swab, presence of viral mutation(s) within the areas targeted by this assay, and inadequate number of viral copies(<138 copies/mL). A negative result must be combined with clinical observations, patient history, and epidemiological information. The expected result is Negative.  Fact Sheet for Patients:  EntrepreneurPulse.com.au  Fact Sheet for Healthcare Providers:  IncredibleEmployment.be  This test is no t yet approved or cleared by the [place] FDA and  has been authorized for detection and/or diagnosis of SARS-CoV-2 by FDA under an Emergency Use Authorization (EUA). This EUA will remain  in effect (meaning this test can be used) for the duration of the COVID-19 declaration under Section 564(b)(1) of the Act, 21 U.S.C.section 360bbb-3(b)(1), unless the authorization is terminated  or revoked sooner.       Influenza A by PCR NEGATIVE NEGATIVE Final   Influenza B by PCR NEGATIVE NEGATIVE Final    Comment: (NOTE) The Xpert Xpress SARS-CoV-2/FLU/RSV plus assay is intended as an aid in the diagnosis of influenza from Nasopharyngeal swab specimens and should not be used as a sole basis for treatment. Nasal washings and aspirates are unacceptable for Xpert Xpress SARS-CoV-2/FLU/RSV testing.  Fact Sheet for Patients: EntrepreneurPulse.com.au  Fact Sheet for Healthcare Providers: IncredibleEmployment.be  This test is not yet approved or cleared by the [place] FDA and has been authorized for detection and/or diagnosis of SARS-CoV-2 by FDA under an Emergency Use Authorization (EUA). This EUA will remain in effect (meaning this test can be used) for the duration of the COVID-19 declaration under Section 564(b)(1) of the Act, 21  U.S.C. section 360bbb-3(b)(1), unless the authorization is terminated or revoked.  Performed at [facility], [place] [address], [place], [place] [address]   MRSA PCR Screening     Status: None   Collection Time: 02/22/20  4:53 AM   Specimen: Nasal Mucosa; Nasopharyngeal  Result Value Ref Range Status   MRSA by PCR NEGATIVE NEGATIVE Final  Comment:        The GeneXpert MRSA Assay (FDA approved for NASAL specimens only), is one component of a comprehensive MRSA colonization surveillance program. It is not intended to diagnose MRSA infection nor to guide or monitor treatment for MRSA infections. Performed at [facility], [place] [address] [place],  [address]      Radiology Studies: CT Head Wo Contrast  Result Date: 02/21/2020 CLINICAL DATA:  Mental status change of unknown cause. EXAM: CT HEAD WITHOUT CONTRAST TECHNIQUE: Contiguous axial images were obtained from the base of the skull through the vertex without intravenous contrast. COMPARISON:  May 26, 2019 FINDINGS: Brain: No subdural, epidural, or subarachnoid hemorrhage. Ventricles and sulci are stable. No acute cortical ischemia infarct. No mass effect or midline shift. Cerebellum, brainstem, and basal cisterns are unremarkable. Vascular: No hyperdense vessel or unexpected calcification. Skull: Normal. Negative for fracture or focal lesion. Sinuses/Orbits: No acute finding. Other: None. IMPRESSION: No acute intracranial abnormalities. Electronically Signed   By: Dorise Bullion III M.D   On: 02/21/2020 16:45   CT Chest Wo Contrast  Result Date: 02/21/2020 CLINICAL DATA:  Weakness, hypotension, multiple falls EXAM: CT CHEST WITHOUT CONTRAST TECHNIQUE: Multidetector CT imaging of the chest was performed following the standard protocol without IV contrast. COMPARISON:  02/21/2020, 04/20/2019 FINDINGS: Cardiovascular: Heart is unremarkable without pericardial effusion. Normal caliber of the thoracic aorta. Minimal  atherosclerosis of the aortic arch. Dual lead pacemaker unchanged. Mediastinum/Nodes: No enlarged mediastinal or axillary lymph nodes. Thyroid gland, trachea, and esophagus demonstrate no significant findings. Lungs/Pleura: Left lower lobe consolidation is identified, corresponding to chest x-ray findings. Favor left lower lobe bronchopneumonia over atelectasis. Mild background emphysema. No effusion or pneumothorax. The central airways are patent. Upper Abdomen: No acute abnormality. Musculoskeletal: No acute or destructive bony lesions. Reconstructed images demonstrate no additional findings. IMPRESSION: 1. Left lower lobe consolidation favor bronchopneumonia. 2. Aortic Atherosclerosis (ICD10-I70.0) and Emphysema (ICD10-J43.9). Electronically Signed   By: Randa Ngo M.D.   On: 02/21/2020 19:05   CT LUMBAR SPINE WO CONTRAST  Result Date: 02/22/2020 CLINICAL DATA:  64 year old female with low back pain, marked lower extremity weakness, prior surgery. EXAM: CT LUMBAR SPINE WITHOUT CONTRAST TECHNIQUE: Multidetector CT imaging of the lumbar spine was performed without intravenous contrast administration. Multiplanar CT image reconstructions were also generated. COMPARISON:  Lumbar spine CT 01/06/2019. FINDINGS: Segmentation: Normal, the same numbering system used on the CT last year. Alignment: Mild chronic grade 1 anterolisthesis at L4-L5 is stable, postoperative changes at that level detailed below. Mildly improved lumbar lordosis elsewhere from the prior CT. Vertebrae: Postoperative details are below. Stable background bone mineralization, including with probable benign T12 body hemangioma, and similar stable lucent area in the medial right iliac bone (series 4, image 117). No acute osseous abnormality identified. SI joints appear intact. Paraspinal and other soft tissues: Stable, negative visible noncontrast abdominal viscera. Aortoiliac calcified atherosclerosis. Postoperative changes to the posterior  paraspinal soft tissues with no adverse features. Disc levels: T11-T12: Negative. T12-L1:  Negative. L1-L2:  Stable mild disc bulge. L2-L3: Chronic disc space loss. Subtle retrolisthesis. Circumferential disc bulge and mild posterior element hypertrophy. No convincing spinal stenosis. Borderline to mild bilateral L2 foraminal stenosis is stable. L3-L4: Chronic circumferential disc bulge. Moderate facet and ligament flavum hypertrophy. No convincing spinal or foraminal stenosis. L4-L5: Prior decompression and fusion. Interbody implant and bilateral pedicle screws appear intact. No evidence of hardware loosening. Solid-appearing posterior element arthrodesis on the left. Solid-appearing left far lateral endplate arthrodesis. Hardware streak  artifact limits detail of the thecal sac. No definite stenosis. L5-S1: Circumferential disc bulge with broad-based central disc protrusion. Chronic epidural lipomatosis at this level effaces CSF from thecal sac. Moderate facet and ligament flavum hypertrophy. Moderate left and mild right L5 foraminal stenosis appears stable. IMPRESSION: 1. Prior decompression and fusion at L4-L5 with stable arthrodesis since last year and no adverse features. 2. 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 Moderate left L5 foraminal stenosis suspected. 3. No acute osseous abnormality.  Other lumbar levels remain stable. Electronically Signed   By: Genevie Ann M.D.   On: 02/22/2020 20:18    Scheduled Meds: . enoxaparin (LOVENOX) injection  40 mg Subcutaneous Q24H  . risperiDONE  3 mg Oral QHS  . vitamin B-12  500 mcg Oral Daily   Continuous Infusions: . ceFEPime (MAXIPIME) IV 2 g (02/23/20 1454)     LOS: 2 days   Marylu Lund, MD [facility] Pager On Amion  If 7PM-7AM, please contact night-coverage 02/23/2020, 4:02 PM

## 2020-02-23 NOTE — TOC Initial Note (Addendum)
Transition of Care [facility]) - Initial/Assessment Note    Patient Details  Name: [name] MRN: [id number] Date of Birth: 1955-10-16  Transition of Care [facility]) CM/SW Contact:    Carles Collet, RN Phone Number: 02/23/2020, 10:08 AM  Clinical Narrative:                Spoke w patient at bedside. Shwe would like SNF services before going home. Patient agreed to referrals being placed through [facility] and [facility]. Will begin SNF workup including [facility] which I anticipate will go to a Level 2.    Expected Discharge Plan: Skilled Nursing Facility Barriers to Discharge: Continued Medical Work up   Patient Goals and CMS Choice Patient states their goals for this hospitalization and ongoing recovery are:: to go to rehab before going home      Expected Discharge Plan and Services Expected Discharge Plan: [place] In-house Referral: Clinical Social Work Discharge Planning Services: CM Consult Post Acute Care Choice: [place] Living arrangements for the past 2 months: Single Family Home                                      Prior Living Arrangements/Services Living arrangements for the past 2 months: Single Family Home Lives with:: Self                   Activities of Daily Living Home Assistive Devices/Equipment: Environmental consultant (specify type) ADL Screening (condition at time of admission) Patient's cognitive ability adequate to safely complete daily activities?: Yes Is the patient deaf or have difficulty hearing?: No Does the patient have difficulty seeing, even when wearing glasses/contacts?: No Does the patient have difficulty concentrating, remembering, or making decisions?: No Patient able to express need for assistance with ADLs?: Yes Does the patient have difficulty dressing or bathing?: No Independently performs ADLs?: No Communication: Independent Dressing (OT): Needs assistance Is this a change from baseline?: Pre-admission  baseline Grooming: Needs assistance Is this a change from baseline?: Pre-admission baseline Feeding: Independent Bathing: Independent Toileting: Needs assistance Is this a change from baseline?: Pre-admission baseline In/Out Bed: Needs assistance Is this a change from baseline?: Pre-admission baseline Walks in Home: Needs assistance Is this a change from baseline?: Pre-admission baseline Does the patient have difficulty walking or climbing stairs?: Yes Weakness of Legs: Both Weakness of Arms/Hands: None  Permission Sought/Granted                  Emotional Assessment              Admission diagnosis:  Dehydration [E86.0] HCAP (healthcare-associated pneumonia) [J18.9] Ambulatory dysfunction [R26.2] Hypotension, unspecified hypotension type [I95.9] Community acquired pneumonia of left lower lobe of lung [J18.9] Patient Active Problem List   Diagnosis Date Noted  . HCAP (healthcare-associated pneumonia) 02/21/2020  . Cough 02/21/2020  . Generalized weakness 02/21/2020  . GAD (generalized anxiety disorder) 02/21/2020  . Proximal muscle weakness 02/08/2020  . Dysphagia 02/08/2020  . Gait disturbance 01/04/2020  . Elevated CK 01/04/2020  . Weakness of both lower extremities 01/04/2020  . Encephalopathy   . Polypharmacy   . CAP (community acquired pneumonia) 12/18/2019  . Fall 12/18/2019  . Hypokalemia   . Abnormal liver function test   . Cervical spondylosis 09/23/2019  . Bilateral elbow joint pain 08/26/2019  . Memory loss 07/02/2019  . Depression with anxiety 07/02/2019  . B12 deficiency 07/02/2019  . Lumbar  radiculopathy 02/10/2019  . History of fusion of lumbar spine 02/10/2019  . Dysesthesia 02/10/2019  . Retroperitoneal fibrosis   . Pelvic adhesions   . Left ovarian cyst 11/17/2018  . Elevated tumor markers 11/17/2018  . Dyspnea 09/15/2018  . Chest pain of uncertain etiology 63/14/9702  . Laryngopharyngeal reflux (LPR) 05/19/2018  . Mood disorder  ([place]) 04/16/2018  . Insomnia 04/16/2018  . Attention deficit hyperactivity disorder (ADHD) 04/16/2018  . Neck pain on right side 09/09/2014  . 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 Family history of arrhythmogenic right ventricular cardiomyopathy 03/30/2013  . 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 02/20/2013  . Sinus pause 02/14/2013  . Hx of syncope- s/p Loop recorder 11/06/12 10/29/2012  . Vertigo 10/29/2012  . Tobacco abuse 10/29/2012  . ANXIETY 11/14/2009  . CERUMEN IMPACTION, RIGHT 11/14/2009  . BENIGN POSITIONAL VERTIGO 11/14/2009  . EAR PAIN, RIGHT 11/14/2009  . SCIATICA 05/09/2009   PCP:  Lujean Amel, MD Pharmacy:   [id number] - [place], [place] - [address] [place] [address] Phone: [phone] Fax: [phone]     Social Determinants of Health (SDOH) Interventions    Readmission Risk Interventions No flowsheet data found.

## 2020-02-24 ENCOUNTER — Inpatient Hospital Stay (HOSPITAL_COMMUNITY): Payer: Medicare HMO

## 2020-02-24 DIAGNOSIS — G959 Disease of spinal cord, unspecified: Principal | ICD-10-CM

## 2020-02-24 LAB — COMPREHENSIVE METABOLIC PANEL
ALT: 6 U/L (ref 0–44)
AST: 9 U/L — ABNORMAL LOW (ref 15–41)
Albumin: 2.6 g/dL — ABNORMAL LOW (ref 3.5–5.0)
Alkaline Phosphatase: 38 U/L (ref 38–126)
Anion gap: 7 (ref 5–15)
BUN: 6 mg/dL — ABNORMAL LOW (ref 8–23)
CO2: 22 mmol/L (ref 22–32)
Calcium: 8.6 mg/dL — ABNORMAL LOW (ref 8.9–10.3)
Chloride: 110 mmol/L (ref 98–111)
Creatinine, Ser: 0.59 mg/dL (ref 0.44–1.00)
GFR, Estimated: >60 mL/min (ref >60)
Glucose, Bld: 90 mg/dL (ref 70–99)
Potassium: 3.9 mmol/L (ref 3.5–5.1)
Sodium: 139 mmol/L (ref 135–145)
Total Bilirubin: 0.9 mg/dL (ref 0.3–1.2)
Total Protein: 4.5 g/dL — ABNORMAL LOW (ref 6.5–8.1)

## 2020-02-24 LAB — CBC
HCT: 35.9 % — ABNORMAL LOW (ref 36.0–46.0)
Hemoglobin: 11.4 g/dL — ABNORMAL LOW (ref 12.0–15.0)
MCH: 28.1 pg (ref 26.0–34.0)
MCHC: 31.8 g/dL (ref 30.0–36.0)
MCV: 88.4 fL (ref 80.0–100.0)
Platelets: 188 10*3/uL (ref 150–400)
RBC: 4.06 MIL/uL (ref 3.87–5.11)
RDW: 14.9 % (ref 11.5–15.5)
WBC: 6.4 10*3/uL (ref 4.0–10.5)
nRBC: 0 % (ref 0.0–0.2)

## 2020-02-24 LAB — VITAMIN B12: Vitamin B-12: 857 pg/mL (ref 180–914)

## 2020-02-24 MED ORDER — MELATONIN 5 MG PO TABS
5.0000 mg | ORAL_TABLET | Freq: Every evening | ORAL | Status: DC | PRN
  Administered 2020-02-24: 5 mg via ORAL
  Filled 2020-02-24: qty 1

## 2020-02-24 MED ORDER — RISPERIDONE 3 MG PO TABS
3.0000 mg | ORAL_TABLET | Freq: Every day | ORAL | Status: DC
  Filled 2020-02-24: qty 1

## 2020-02-24 MED ORDER — RISPERIDONE 3 MG PO TABS
3.0000 mg | ORAL_TABLET | Freq: Every day | ORAL | Status: DC
  Administered 2020-02-25 – 2020-02-26 (×2): 3 mg via ORAL
  Filled 2020-02-24 (×3): qty 1

## 2020-02-24 MED ORDER — RISPERIDONE 3 MG PO TABS: 3.0000 mg | ORAL_TABLET | Freq: Every day | ORAL | Status: DC

## 2020-02-24 NOTE — Progress Notes (Signed)
Modified Barium Swallow Progress Note  Patient Details  Name: [name] MRN: [id number] Date of Birth: November 18, 1955  Today's Date: 02/24/2020  Modified Barium Swallow completed.  Full report located under Chart Review in the Imaging Section.  Brief recommendations include the following:  Clinical Impression  Pt demonstrates a moderate oral dysphagia, delayed swallow initiation and decreased UES opening due to prior history of ACDF and presence of cervical fusion plate. Oral phase is adequate, but strength for negative oral pressure to draw in bolus appears impaired. Bolus cohesion also poor with long thin bolus, contributing to delayed swallow initaition. Pt has aspiration prior to adequate hyoid burst and laryngeal closure with consecutive or large boluses of thin liquids. Single sips of thin liquids are tolerated with only minimal penetration. Pt was able to carry over this strategy consistnetly. Though pharyngeal strength was relatively good, she did have mild residue with thincker textures, particulalry nectar and also graham cracker, which cleared with a bite of puree. Pt is advised to continue a regular diet and thin liquids, follow solid with liquids, avoid certain foods that typically hang in her throat (chronic problem since ACDF). She also had great success with pills whole in puree.    Swallow Evaluation Recommendations       SLP Diet Recommendations: Regular solids;Thin liquid   Liquid Administration via: Cup;No straw   Medication Administration: Whole meds with puree   Supervision: Patient able to self feed   Compensations: Slow rate;Small sips/bites;Clear throat after each swallow   Postural Changes: Remain semi-upright after after feeds/meals (Comment)   Oral Care Recommendations: Oral care BID      Herbie [place], MA [place] Pager [phone] Office [phone]   Lynann Beaver 02/24/2020,2:09 PM

## 2020-02-24 NOTE — Progress Notes (Signed)
Pt. Refused to eat breakfast and lunch. Dietition services approached me and told me that pt. Said to take the trays back, that she did not want to eat. MD notified.

## 2020-02-24 NOTE — Progress Notes (Signed)
PROGRESS NOTE    [name]  JKK:[id number] DOB: 06/26/1955 DOA: 02/21/2020 PCP: Lujean Amel, MD    Brief Narrative:  64 y.o. female with medical history significant for vitamin B12 deficiency, tobacco abuse, depression, anxiety, arrhythmia status post pacemaker placement in December 2014,  presenting from home to [facility] complaining of generalized leg weakness  Assessment & Plan:  Bilateral lower extremity weakness -Progressive since September, to the extent that she went from walking normally to requiring a walker and now mostly bed/chair bound -Followed by Dr. Felecia Shelling as outpatient -Outpatient myasthenia gravis work-up was negative so far -B12 level is normal -Neurology to evaluate today  Doubt healthcare associated pneumonia -Initial chest x-ray was concerning for atelectasis versus infiltrate -Patient has no pulmonary symptoms, no cough -Discontinue cefepime and monitor  Tobacco abuse -Counseled  Moderate protein calorie malnutrition -Ongoing weight loss, temporal wasting noted, supplements as tolerated  Anxiety Bipolar disorder -Continue risperidone and Xanax per home regimen  DVT prophylaxis: Lovenox subq Code Status: Full Family Communication: Pt in room, family not at bedside  Status is: Inpatient  Remains inpatient appropriate because:Unsafe d/c plan   Dispo: The patient is from: Home              Anticipated d/c is to: SNF              Anticipated d/c date is: 2 days              Patient currently is not medically stable to d/c.   Consultants:     Procedures:     Antimicrobials: Anti-infectives (From admission, onward)   Start     Dose/Rate Route Frequency Ordered Stop   02/22/20 0900  vancomycin (VANCOREADY) IVPB 750 mg/150 mL  Status:  Discontinued       "Followed by" Linked Group Details   750 mg 150 mL/hr over 60 Minutes Intravenous Every 12 hours 02/21/20 2033 02/22/20 1615   02/21/20 2200  ceFEPIme  (MAXIPIME) 2 g in sodium chloride 0.9 % 100 mL IVPB  Status:  Discontinued        2 g 200 mL/hr over 30 Minutes Intravenous Every 8 hours 02/21/20 2033 02/24/20 1035   02/21/20 2045  vancomycin (VANCOREADY) IVPB 1250 mg/250 mL       "Followed by" Linked Group Details   1,250 mg 166.7 mL/hr over 90 Minutes Intravenous  Once 02/21/20 2033 02/21/20 2319   02/21/20 1930  levofloxacin (LEVAQUIN) IVPB 750 mg  Status:  Discontinued        750 mg 100 mL/hr over 90 Minutes Intravenous  Once 02/21/20 1922 02/21/20 2031      Subjective: -Complains of lower extremity weakness  Objective: Vitals:   02/23/20 1415 02/23/20 2109 02/24/20 0525 02/24/20 1002  BP: 93/62 98/60 (!) 84/54 (!) 91/59  Pulse: 61 97 (!) 59 61  Resp: 16 18 16 18   Temp: 97.6 F (36.4 C) 98.4 F (36.9 C) 98 F (36.7 C) 98.3 F (36.8 C)  TempSrc:  Oral Oral Oral  SpO2: 97% 97% 97% 98%  Weight:      Height:        Intake/Output Summary (Last 24 hours) at 02/24/2020 1443 Last data filed at 02/24/2020 1419 Gross per 24 hour  Intake 70 ml  Output 250 ml  Net -180 ml   Filed Weights   02/22/20 0450  Weight: 54 kg    Examination:  General exam: Frail thinly built female laying in bed, AAOx3, no distress CVS:  S1-S2, regular rate rhythm Lungs: Clear bilaterally Abdomen: Soft, nontender, bowel sounds present Extremities: No edema awake, laying in bed, in nad Neuro: Bilateral lower extremity weakness, diminished reflexes, mute plantars   Data Reviewed: I have personally reviewed following labs and imaging studies  CBC: Recent Labs  Lab 02/21/20 1618 02/22/20 0536 02/23/20 0152 02/24/20 0334  WBC 7.9 7.0 5.8 6.4  NEUTROABS 5.1 4.4  --   --   HGB 13.0 12.4 11.3* 11.4*  HCT 42.4 37.7 35.7* 35.9*  MCV 91.6 89.8 89.3 88.4  PLT 235 193 196 601   Basic Metabolic Panel: Recent Labs  Lab 02/21/20 1618 02/22/20 0748 02/23/20 0152 02/24/20 0334  NA 141 142 140 139  K 3.5 3.6 3.4* 3.9  CL 108 112* 110 110   CO2 23 21* 20* 22  GLUCOSE 87 82 86 90  BUN 10 6* 6* 6*  CREATININE 0.81 0.71 0.64 0.59  CALCIUM 9.1 8.6* 8.6* 8.6*  MG 2.0 1.7  --   --    GFR: Estimated Creatinine Clearance: 62.2 mL/min (by C-G formula based on SCr of 0.59 mg/dL). Liver Function Tests: Recent Labs  Lab 02/21/20 1618 02/22/20 0748 02/23/20 0152 02/24/20 0334  AST 12* 11* 9* 9*  ALT 10 8 7 6   ALKPHOS 45 35* 39 38  BILITOT 1.3* 1.6* 1.3* 0.9  PROT 5.5* 4.7* 4.5* 4.5*  ALBUMIN 3.5 2.8* 2.6* 2.6*   No results for input(s): LIPASE, AMYLASE in the last 168 hours. No results for input(s): AMMONIA in the last 168 hours. Coagulation Profile: No results for input(s): INR, PROTIME in the last 168 hours. Cardiac Enzymes: No results for input(s): CKTOTAL, CKMB, CKMBINDEX, TROPONINI in the last 168 hours. BNP (last 3 results) No results for input(s): PROBNP in the last 8760 hours. HbA1C: No results for input(s): HGBA1C in the last 72 hours. CBG: No results for input(s): GLUCAP in the last 168 hours. Lipid Profile: No results for input(s): CHOL, HDL, LDLCALC, TRIG, CHOLHDL, LDLDIRECT in the last 72 hours. Thyroid Function Tests: Recent Labs    02/22/20 0748  TSH 1.565   Anemia Panel: No results for input(s): VITAMINB12, FOLATE, FERRITIN, TIBC, IRON, RETICCTPCT in the last 72 hours. Sepsis Labs: Recent Labs  Lab 02/21/20 1614  LATICACIDVEN 1.8    Recent Results (from the past 240 hour(s))  Culture, blood (routine x 2)     Status: None (Preliminary result)   Collection Time: 02/21/20  4:00 PM   Specimen: BLOOD LEFT ARM  Result Value Ref Range Status   Specimen Description BLOOD LEFT ARM  Final   Special Requests   Final    BOTTLES DRAWN AEROBIC AND ANAEROBIC Blood Culture results may not be optimal due to an inadequate volume of blood received in culture bottles   Culture   Final    NO GROWTH 3 DAYS Performed at [facility], [place] [address] [place], [place] [address]    Report Status PENDING   Incomplete  Culture, blood (routine x 2)     Status: None (Preliminary result)   Collection Time: 02/21/20  4:04 PM   Specimen: BLOOD RIGHT ARM  Result Value Ref Range Status   Specimen Description BLOOD RIGHT ARM  Final   Special Requests   Final    BOTTLES DRAWN AEROBIC AND ANAEROBIC Blood Culture results may not be optimal due to an inadequate volume of blood received in culture bottles   Culture   Final    NO GROWTH 3 DAYS Performed  at [facility], [place] [address], [place], [place] [address]    Report Status PENDING  Incomplete  Resp Panel by RT-PCR (Flu A&B, Covid) Nasopharyngeal Swab     Status: None   Collection Time: 02/21/20  4:05 PM   Specimen: Nasopharyngeal Swab; Nasopharyngeal(NP) swabs in vial transport medium  Result Value Ref Range Status   SARS Coronavirus 2 by RT PCR NEGATIVE NEGATIVE Final    Comment: (NOTE) SARS-CoV-2 target nucleic acids are NOT DETECTED.  The SARS-CoV-2 RNA is generally detectable in upper respiratory specimens during the acute phase of infection. The lowest concentration of SARS-CoV-2 viral copies this assay can detect is 138 copies/mL. A negative result does not preclude SARS-Cov-2 infection and should not be used as the sole basis for treatment or other patient management decisions. A negative result may occur with  improper specimen collection/handling, submission of specimen other than nasopharyngeal swab, presence of viral mutation(s) within the areas targeted by this assay, and inadequate number of viral copies(<138 copies/mL). A negative result must be combined with clinical observations, patient history, and epidemiological information. The expected result is Negative.  Fact Sheet for Patients:  EntrepreneurPulse.com.au  Fact Sheet for Healthcare Providers:  IncredibleEmployment.be  This test is no t yet approved or cleared by the [place] FDA and  has been authorized for detection  and/or diagnosis of SARS-CoV-2 by FDA under an Emergency Use Authorization (EUA). This EUA will remain  in effect (meaning this test can be used) for the duration of the COVID-19 declaration under Section 564(b)(1) of the Act, 21 U.S.C.section 360bbb-3(b)(1), unless the authorization is terminated  or revoked sooner.       Influenza A by PCR NEGATIVE NEGATIVE Final   Influenza B by PCR NEGATIVE NEGATIVE Final    Comment: (NOTE) The Xpert Xpress SARS-CoV-2/FLU/RSV plus assay is intended as an aid in the diagnosis of influenza from Nasopharyngeal swab specimens and should not be used as a sole basis for treatment. Nasal washings and aspirates are unacceptable for Xpert Xpress SARS-CoV-2/FLU/RSV testing.  Fact Sheet for Patients: EntrepreneurPulse.com.au  Fact Sheet for Healthcare Providers: IncredibleEmployment.be  This test is not yet approved or cleared by the [place] FDA and has been authorized for detection and/or diagnosis of SARS-CoV-2 by FDA under an Emergency Use Authorization (EUA). This EUA will remain in effect (meaning this test can be used) for the duration of the COVID-19 declaration under Section 564(b)(1) of the Act, 21 U.S.C. section 360bbb-3(b)(1), unless the authorization is terminated or revoked.  Performed at [facility], [place] [address], [place], [place] [address]   MRSA PCR Screening     Status: None   Collection Time: 02/22/20  4:53 AM   Specimen: Nasal Mucosa; Nasopharyngeal  Result Value Ref Range Status   MRSA by PCR NEGATIVE NEGATIVE Final    Comment:        The GeneXpert MRSA Assay (FDA approved for NASAL specimens only), is one component of a comprehensive MRSA colonization surveillance program. It is not intended to diagnose MRSA infection nor to guide or monitor treatment for MRSA infections. Performed at [facility], [place] [address], [place], [place] [address]      Radiology  Studies: CT LUMBAR SPINE WO CONTRAST  Result Date: 02/22/2020 CLINICAL DATA:  64 year old female with low back pain, marked lower extremity weakness, prior surgery. EXAM: CT LUMBAR SPINE WITHOUT CONTRAST TECHNIQUE: Multidetector CT imaging of the lumbar spine was performed without intravenous contrast administration. Multiplanar CT image  reconstructions were also generated. COMPARISON:  Lumbar spine CT 01/06/2019. FINDINGS: Segmentation: Normal, the same numbering system used on the CT last year. Alignment: Mild chronic grade 1 anterolisthesis at L4-L5 is stable, postoperative changes at that level detailed below. Mildly improved lumbar lordosis elsewhere from the prior CT. Vertebrae: Postoperative details are below. Stable background bone mineralization, including with probable benign T12 body hemangioma, and similar stable lucent area in the medial right iliac bone (series 4, image 117). No acute osseous abnormality identified. SI joints appear intact. Paraspinal and other soft tissues: Stable, negative visible noncontrast abdominal viscera. Aortoiliac calcified atherosclerosis. Postoperative changes to the posterior paraspinal soft tissues with no adverse features. Disc levels: T11-T12: Negative. T12-L1:  Negative. L1-L2:  Stable mild disc bulge. L2-L3: Chronic disc space loss. Subtle retrolisthesis. Circumferential disc bulge and mild posterior element hypertrophy. No convincing spinal stenosis. Borderline to mild bilateral L2 foraminal stenosis is stable. L3-L4: Chronic circumferential disc bulge. Moderate facet and ligament flavum hypertrophy. No convincing spinal or foraminal stenosis. L4-L5: Prior decompression and fusion. Interbody implant and bilateral pedicle screws appear intact. No evidence of hardware loosening. Solid-appearing posterior element arthrodesis on the left. Solid-appearing left far lateral endplate arthrodesis. Hardware streak artifact limits detail of the thecal sac. No definite  stenosis. L5-S1: Circumferential disc bulge with broad-based central disc protrusion. Chronic epidural lipomatosis at this level effaces CSF from thecal sac. Moderate facet and ligament flavum hypertrophy. Moderate left and mild right L5 foraminal stenosis appears stable. IMPRESSION: 1. Prior decompression and fusion at L4-L5 with stable arthrodesis since last year and no adverse features. 2. 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 Moderate left L5 foraminal stenosis suspected. 3. No acute osseous abnormality.  Other lumbar levels remain stable. Electronically Signed   By: Genevie Ann M.D.   On: 02/22/2020 20:18    Scheduled Meds: . enoxaparin (LOVENOX) injection  40 mg Subcutaneous Q24H  . [START ON 02/25/2020] risperiDONE  3 mg Oral QHS  . vitamin B-12  500 mcg Oral Daily   Continuous Infusions:    LOS: 3 days   Domenic Polite, MD [facility] 02/24/2020, 2:43 PM

## 2020-02-24 NOTE — TOC Progression Note (Signed)
Transition of Care [facility]) - Progression Note    Patient Details  Name: AVENELL SELLERS MRN: [id number] Date of Birth: [birth year]  [facility] CM/SW Contact  Carles Collet, RN Phone Number: 02/24/2020, 10:33 AM  Clinical Narrative:   Damaris Schooner w patient at bedside, provided her with written copies of ratings from accepting facilities, [facility]. Patient is agreeable to go there. 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 Patient states she is fully vaccinated. Patient will need COVID test within 48 hours of DC to [place] per their policy.     Expected Discharge Plan: [place] Barriers to Discharge: Continued Medical Work up  Expected Discharge Plan and Services Expected Discharge Plan: [place] In-house Referral: Clinical Social Work Discharge Planning Services: CM Consult Post Acute Care Choice: [place] arrangements for the past 2 months: Single Family Home                                       Social Determinants of Health (SDOH) Interventions    Readmission Risk Interventions No flowsheet data found.

## 2020-02-24 NOTE — Plan of Care (Signed)
  Problem: Education: Goal: Knowledge of General Education information will improve Description Including pain rating scale, medication(s)/side effects and non-pharmacologic comfort measures Outcome: Progressing   

## 2020-02-24 NOTE — Consult Note (Signed)
Neurology Consultation Reason for Consult: extremity weakness Referring Physician: Fanny Bien.   CC: I have been getting weaker since my hospital stay in September.   History is obtained from: patient and chart was reviewed  HPI: [name] is a 64 y.o. female with a PMHx of bipolar disorder, anxiety, depression, Vit B12 deficiency, Spinal stenosis, tobacco use, and arrhythmia s/p PPM. Pt came to hospital on 12/5 with c/o cough and generalized weakness. She was found to have HCAP and is being treated by antibiotics and supportive care. She was neg for Covid/influenza and sepsis. CT head was performed due to weakness and was negative for acute. Patient has also had a lumbar spine CT with results below. She also c/o dysphagia and has had a SLP evaluation, presently on a regular diet.   Pt reports that after being hospitalized in Sept for PNA, She went home with a walker and PT. She slowly began to notice some weakness of her legs, proximally to distally. First, having difficulty lifting her thighs to pivot to bed and get on toilet. This progressed to include her lower legs/feet til she had difficulty ambulating, callling it "shaky and wobbly".  This progressively worsened until she was unable to participate in some ADLs that she had previously done independently, like cooking, showering, ambulating. She states that the PT really did not help. By November, she began to notice some arm weaness as well and reported her arms feeling tired when trying to comb her hair or brush her teeth. She remained able to feed herself but at soft foods in fear of choking. At that point, her daughter was helping her at home and bringing her meals.   She never noticed any vision changes nor did she notice any ptosis. No HA, n/v, pain other than chronic pain to back.   She saw Dr. Felecia Shelling outpt in Oct for this weakness. She states he never mentioned MG. She returned to see Dr. Felecia Shelling in Nov and a AChR test was < 0.03 on  02/08/20. When asked what she was told was the reason for her sx, she replied that he said he didn't know and to keep doing PT. She was supposed to see him again in 6 months.    ROS: A 14 point ROS was performed and is negative except as noted in the HPI.   Past Medical History:  Diagnosis Date  . Anxiety    anxiety and mild depressive order systoms  . Bipolar disorder ([place])     (02/20/2013)  . Cervical cancer ([place]) 03/19/1980  . Depression   . Dysrhythmia   . KDTOIZTI(458.0)    "monthly" (02/20/2013)  . Pacemaker    medtronic  . PONV (postoperative nausea and vomiting)   . Sinus pause 02/14/2013  . Syncope and collapse    echo-April 12,2012-EF 55% Echo normal; recorder with prolonged sinus pauses --> status post  . Tobacco abuse   . Vertigo       Family History  Adopted: Yes  Problem Relation Age of Onset  . Heart attack Brother      Social History:   reports that she has been smoking cigarettes. She has a 20.00 pack-year smoking history. She has never used smokeless tobacco. She reports that she does not drink alcohol and does not use drugs.  Medications  Current Facility-Administered Medications:  .  acetaminophen (TYLENOL) tablet 650 mg, 650 mg, Oral, Q6H PRN **OR** acetaminophen (TYLENOL) suppository 650 mg, 650 mg, Rectal, Q6H PRN, Howerter, Justin B,  DO .  albuterol (VENTOLIN HFA) 108 (90 Base) MCG/ACT inhaler 1-2 puff, 1-2 puff, Inhalation, Q4H PRN, Howerter, Justin B, DO .  ALPRAZolam Duanne Moron) tablet 1 mg, 1 mg, Oral, BID PRN, Howerter, Justin B, DO, 1 mg at 02/23/20 2236 .  cyclobenzaprine (FLEXERIL) tablet 10 mg, 10 mg, Oral, QHS PRN, Howerter, Justin B, DO .  enoxaparin (LOVENOX) injection 40 mg, 40 mg, Subcutaneous, Q24H, Howerter, Justin B, DO, 40 mg at 02/24/20 0931 .  nicotine (NICODERM CQ - dosed in mg/24 hours) patch 21 mg, 21 mg, Transdermal, Daily PRN, Howerter, Justin B, DO .  ondansetron (ZOFRAN) injection 4 mg, 4 mg, Intravenous, Q6H PRN, Howerter,  Justin B, DO .  risperiDONE (RISPERDAL) tablet 3 mg, 3 mg, Oral, QHS, Howerter, Justin B, DO, 3 mg at 02/23/20 2236 .  vitamin B-12 (CYANOCOBALAMIN) tablet 500 mcg, 500 mcg, Oral, Daily, Howerter, Justin B, DO, 500 mcg at 02/24/20 0931   Exam: Current vital signs: BP (!) 91/59 (BP Location: Left Arm)   Pulse 61   Temp 98.3 F (36.8 C) (Oral)   Resp 18   Ht _0  (1.702 m)   Wt 54 kg   SpO2 98%   BMI 18.65 kg/m  Vital signs in last 24 hours: Temp:  [97.6 F (36.4 C)-98.4 F (36.9 C)] 98.3 F (36.8 C) (12/08 1002) Pulse Rate:  [59-97] 61 (12/08 1002) Resp:  [16-18] 18 (12/08 1002) BP: (84-98)/(54-62) 91/59 (12/08 1002) SpO2:  [97 %-98 %] 98 % (12/08 1002)  GENERAL: Awake, alert in NAD HEENT: - Normocephalic and atraumatic, dry mm, no LN++, no Thyromegally LUNGS - Clear to auscultation bilaterally with no wheezes CV - S1S2 RRR, no m/r/g, equal pulses bilaterally. ABDOMEN - Soft, nontender, nondistended with normoactive BS Ext: warm, well perfused, intact peripheral pulses, __ edema  NEURO:  Mental Status: AA&Ox3  Language: speech is fluent. Naming, repetition, and comprehension intact. She is very mildly dysarthric Cranial Nerves:  II: PERRL 44m/brisk. visual fields full, III, IV, VI: EOMI. No ptosis after batting eyes or upward gaze for 20 secs.  V: Sensation to light touch to face is symmetrical.  VIII: At rest I have the impression that she has mildly decreased nasolabial fold on the left VIII:hearing intact IX, X: :tongue/uvula/soft palate midline, XI: shoulder shrug symmetrical and 5/5.  XII: tongue goes lslightly to the left Motor: She has some increased tone in bilateral lower extremities Strength: She has 4/5 weakness throughout the left arm and leg, 4+/5 weakness on the right Sensation- Intact to light touch bilaterally to all extremities.  Coordination: FTN intact bilaterally  DTRs: 3+ and symmetric, with crossed adductors and upgoing toe on the left,  equivocal on the right   Labs I have reviewed labs in epic and the results pertinent to this consultation are: 02/08/20 AChR ab <0.03 per outpt appt. Vit B12 1153 on 11/22 (after Dr. SFelecia Shellingincreased dose). No CK, ESR.   CBC    Component Value Date/Time   WBC 6.4 02/24/2020 0334   RBC 4.06 02/24/2020 0334   HGB 11.4 (L) 02/24/2020 0334   HCT 35.9 (L) 02/24/2020 0334   PLT 188 02/24/2020 0334   MCV 88.4 02/24/2020 0334   MCH 28.1 02/24/2020 0334   MCHC 31.8 02/24/2020 0334   RDW 14.9 02/24/2020 0334   LYMPHSABS 1.7 02/22/2020 0536   MONOABS 0.6 02/22/2020 0536   EOSABS 0.2 02/22/2020 0536   BASOSABS 0.1 02/22/2020 0536    CMP     Component Value Date/Time  NA 139 02/24/2020 0334   NA 139 01/04/2020 1147   K 3.9 02/24/2020 0334   CL 110 02/24/2020 0334   CO2 22 02/24/2020 0334   GLUCOSE 90 02/24/2020 0334   BUN 6 (L) 02/24/2020 0334   BUN 11 01/04/2020 1147   CREATININE 0.59 02/24/2020 0334   CREATININE 1.08 (H) 09/24/2018 1019   CALCIUM 8.6 (L) 02/24/2020 0334   PROT 4.5 (L) 02/24/2020 0334   PROT 6.6 01/04/2020 1147   ALBUMIN 2.6 (L) 02/24/2020 0334   ALBUMIN 4.4 01/04/2020 1147   AST 9 (L) 02/24/2020 0334   ALT 6 02/24/2020 0334   ALKPHOS 38 02/24/2020 0334   BILITOT 0.9 02/24/2020 0334   BILITOT 1.0 01/04/2020 1147   GFRNONAA >60 02/24/2020 0334   GFRNONAA 56 (L) 09/24/2018 1019   GFRAA 69 01/04/2020 1147   GFRAA 65 09/24/2018 1019    Lipid Panel     Component Value Date/Time   CHOL 256 (H) 09/24/2018 1019   TRIG 130 09/24/2018 1019   HDL 50 09/24/2018 1019   CHOLHDL 5.1 (H) 09/24/2018 1019   LDLCALC 179 (H) 09/24/2018 1019     Imaging I have reviewed the images obtained: CT-scan of the brain-No acute intracranial abnormalities. CT L spine-1. Prior decompression and fusion at L4-L5 with stable arthrodesis since last year and no adverse features. 2. 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 Moderate  left L5 foraminal stenosis suspected. 3. No acute osseous abnormality.  Other lumbar levels remain stable.   Assessment: 64 yo female with progressive extremity weakness since September.  She has normal sensation, but with spasticity and distribution of her weakness I am concerned for myelopathy.  She has a history of ACDF and actually had a CT myelogram in July which was negative, but she had none of her current symptoms at that time.  Also possible would be intrinsic cord issue and therefore I would like to check for inflammation with lumbar puncture.  I think that she might have mild left facial weakness as well, but reviewing her picture in epic I think it might be old.  Possibilities at the current time include myelopathy from either structural, inflammatory, or metabolic causes, motor neuron disease such as ALS, or less likely given the negative imaging previous strokes.  I do not see any evidence of lower motor neuron dysfunction such as fasciculations at this time, therefore if that continues to be a consideration, she would need EMG to look for evidence of denervation.  Recommendations: 1) CT myelogram of the spine 2) we will get lumbar puncture given that they are going to access the thecal sac when performing the myelogram.  Send protein, glucose, cells, OC bands, IgG index 3) B12, copper, vitamin E, aCE 4) if above work-up is negative, I would recommend EMG/nerve conduction study which would need to be done as an outpatient, if there was widespread denervation then that would   Roland Rack, MD [facility] [phone]  If 7pm- 7am, please page neurology on call as listed in [place].

## 2020-02-24 NOTE — Plan of Care (Signed)

## 2020-02-24 NOTE — TOC Progression Note (Signed)
Transition of Care [facility]) - Progression Note    Patient Details  Name: ALEESE KAMPS MRN: [id number] Date of Birth: 01-Feb-1956  Transition of Care [facility]) CM/SW Contact  Joanne Chars, LCSW Phone Number: 02/24/2020, 8:34 AM  Clinical Narrative:   PASSR received: [phone] [id number]    Expected Discharge Plan: Skilled Nursing Facility Barriers to Discharge: Continued Medical Work up  Expected Discharge Plan and Services Expected Discharge Plan: [place] In-house Referral: Clinical Social Work Discharge Planning Services: CM Consult Post Acute Care Choice: [place] arrangements for the past 2 months: Single Family Home                                       Social Determinants of Health (SDOH) Interventions    Readmission Risk Interventions No flowsheet data found.

## 2020-02-24 NOTE — Care Management Important Message (Signed)
Important Message  Patient Details  Name: [name] MRN: [id number] Date of Birth: 1955-10-25   Medicare Important Message Given:  Yes     Orbie Pyo 02/24/2020, 3:42 PM

## 2020-02-25 ENCOUNTER — Inpatient Hospital Stay (HOSPITAL_COMMUNITY): Payer: Medicare HMO

## 2020-02-25 ENCOUNTER — Telehealth: Payer: Self-pay | Admitting: Cardiovascular Disease

## 2020-02-25 LAB — COMPREHENSIVE METABOLIC PANEL
ALT: 7 U/L (ref 0–44)
AST: 11 U/L — ABNORMAL LOW (ref 15–41)
Albumin: 2.8 g/dL — ABNORMAL LOW (ref 3.5–5.0)
Alkaline Phosphatase: 40 U/L (ref 38–126)
Anion gap: 10 (ref 5–15)
BUN: 7 mg/dL — ABNORMAL LOW (ref 8–23)
CO2: 24 mmol/L (ref 22–32)
Calcium: 9.1 mg/dL (ref 8.9–10.3)
Chloride: 105 mmol/L (ref 98–111)
Creatinine, Ser: 0.57 mg/dL (ref 0.44–1.00)
GFR, Estimated: >60 mL/min (ref >60)
Glucose, Bld: 93 mg/dL (ref 70–99)
Potassium: 3.7 mmol/L (ref 3.5–5.1)
Sodium: 139 mmol/L (ref 135–145)
Total Bilirubin: 1.2 mg/dL (ref 0.3–1.2)
Total Protein: 4.9 g/dL — ABNORMAL LOW (ref 6.5–8.1)

## 2020-02-25 LAB — CBC
HCT: 36.2 % (ref 36.0–46.0)
Hemoglobin: 12.2 g/dL (ref 12.0–15.0)
MCH: 29 pg (ref 26.0–34.0)
MCHC: 33.7 g/dL (ref 30.0–36.0)
MCV: 86.2 fL (ref 80.0–100.0)
Platelets: 200 10*3/uL (ref 150–400)
RBC: 4.2 MIL/uL (ref 3.87–5.11)
RDW: 14.7 % (ref 11.5–15.5)
WBC: 6.9 10*3/uL (ref 4.0–10.5)
nRBC: 0 % (ref 0.0–0.2)

## 2020-02-25 LAB — ANGIOTENSIN CONVERTING ENZYME: Angiotensin-Converting Enzyme: 19 U/L (ref 14–82)

## 2020-02-25 LAB — CSF CELL COUNT WITH DIFFERENTIAL
RBC Count, CSF: 1 /mm3 — ABNORMAL HIGH
Tube #: 3
WBC, CSF: 0 /mm3 (ref 0–5)

## 2020-02-25 LAB — GLUCOSE, CSF: Glucose, CSF: 50 mg/dL (ref 40–70)

## 2020-02-25 LAB — RPR: RPR Ser Ql: NONREACTIVE

## 2020-02-25 LAB — PROTEIN, CSF: Total  Protein, CSF: 57 mg/dL — ABNORMAL HIGH (ref 15–45)

## 2020-02-25 LAB — CORTISOL-AM, BLOOD: Cortisol - AM: 16.5 ug/dL (ref 6.7–22.6)

## 2020-02-25 MED ORDER — IOHEXOL 300 MG/ML  SOLN
10.0000 mL | Freq: Once | INTRAMUSCULAR | Status: AC | PRN
  Administered 2020-02-25: 10 mL via INTRATHECAL

## 2020-02-25 MED ORDER — LIDOCAINE HCL (PF) 1 % IJ SOLN
5.0000 mL | Freq: Once | INTRAMUSCULAR | Status: AC
  Administered 2020-02-25: 5 mL via INTRADERMAL

## 2020-02-25 NOTE — Progress Notes (Signed)
PT Cancellation Note  Patient Details Name: [name] MRN: [id number] DOB: [birth year]   Cancelled Treatment:    Reason Eval/Treat Not Completed: Patient not medically ready (pt on bedrest s/p lumbar puncture)   Maayan Jenning B Terryann Verbeek 02/25/2020, 10:31 AM  [name], PT Acute Rehabilitation Services Pager: [phone] Office: [phone]

## 2020-02-25 NOTE — Progress Notes (Signed)
PROGRESS NOTE    [name]  FAO:[id number] DOB: [birth year] DOA: 02/21/2020 PCP: Lujean Amel, MD    Brief Narrative:  64 y.o. 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 Felecia Shelling as outpatient -B12 level was low at 146,  2 months ago, replacement started then -Outpatient myasthenia gravis work-up was negative so far -B12 level is normal now, MMA is pending, continue vitamin D supplementation -Neurology following, LP-mostly unremarkable, CT cervical thoracic and lumbar spine without evidence of cord impingement -Will need nerve conduction studies as outpatient -Plan for SNF discharge  Doubt healthcare associated pneumonia -Initial chest x-ray was concerning for atelectasis versus infiltrate -Patient has no pulmonary symptoms, no cough -Discontinue cefepime and monitor  Tobacco abuse -Counseled  Moderate protein calorie malnutrition -Ongoing weight loss, temporal wasting noted, supplements as tolerated -Encouraged p.o. intake okay  Anxiety Bipolar disorder -Continue risperidone and Xanax per home regimen  DVT prophylaxis: Lovenox subq Code Status: Full Family Communication: Pt in room, family not at bedside  Status is: Inpatient  Remains inpatient appropriate because:Unsafe d/c plan   Dispo: The patient is from: Home              Anticipated d/c is to: SNF              Anticipated d/c date is: 1  day              Patient currently is not medically stable to d/c.   Consultants:     Procedures:     Antimicrobials: Anti-infectives (From  admission, onward)   Start     Dose/Rate Kim Frequency Ordered Stop   02/22/20 0900  vancomycin (VANCOREADY) IVPB 750 mg/150 mL  Status:  Discontinued       "Followed by" Linked Group Details   750 mg 150 mL/hr over 60 Minutes Intravenous Every 12 hours 02/21/20 2033 02/22/20 1615   02/21/20 2200  ceFEPIme (MAXIPIME) 2 g in sodium chloride 0.9 % 100 mL IVPB  Status:  Discontinued        2 g 200 mL/hr over 30 Minutes Intravenous Every 8 hours 02/21/20 2033 02/24/20 1035   02/21/20 2045  vancomycin (VANCOREADY) IVPB 1250 mg/250 mL       "Followed by" Linked Group Details   1,250 mg 166.7 mL/hr over 90 Minutes Intravenous  Once 02/21/20 2033 02/21/20 2319   02/21/20 1930  levofloxacin (LEVAQUIN) IVPB 750 mg  Status:  Discontinued        750 mg 100 mL/hr over 90 Minutes Intravenous  Once 02/21/20 1922 02/21/20 2031      Subjective: -No events overnight, underwent spinal tap today  Objective: Vitals:   02/24/20 1002 02/24/20 1540 02/24/20 2120 02/25/20 0500  BP: (!) 91/59 104/65 96/60 (!) 92/54  Pulse: 61 67 60 60  Resp: 18 18 17 16   Temp: 98.3 F (36.8 C) 98.5 F (36.9 C) 99.3 F (37.4 C) 98.4 F (36.9 C)  TempSrc: Oral  Oral Oral  SpO2: 98% 98% 96% 99%  Weight:    53.9 kg  Height:        Intake/Output Summary (Last 24 hours) at 02/25/2020 1440 Last data  filed at 02/24/2020 1900 Gross per 24 hour  Intake --  Output 1000 ml  Net -1000 ml   Filed Weights   02/22/20 0450 02/25/20 0500  Weight: 54 kg 53.9 kg    Examination:  General exam: Chronically ill thinly built female sitting in bed, AAOx3, no distress CVS: S1-S2, regular rate rhythm Lungs: Clear bilaterally Abdomen: Soft, nontender, bowel sounds present Extremities: No edema Neuro: Bilateral lower extremity weakness, diminished reflexes, mute plantars   Data Reviewed: I have personally reviewed following labs and imaging studies  CBC: Recent Labs  Lab 02/21/20 1618 02/22/20 0536 02/23/20 0152  02/24/20 0334 02/25/20 0218  WBC 7.9 7.0 5.8 6.4 6.9  NEUTROABS 5.1 4.4  --   --   --   HGB 13.0 12.4 11.3* 11.4* 12.2  HCT 42.4 37.7 35.7* 35.9* 36.2  MCV 91.6 89.8 89.3 88.4 86.2  PLT 235 193 196 188 998   Basic Metabolic Panel: Recent Labs  Lab 02/21/20 1618 02/22/20 0748 02/23/20 0152 02/24/20 0334 02/25/20 0218  NA 141 142 140 139 139  K 3.5 3.6 3.4* 3.9 3.7  CL 108 112* 110 110 105  CO2 23 21* 20* 22 24  GLUCOSE 87 82 86 90 93  BUN 10 6* 6* 6* 7*  CREATININE 0.81 0.71 0.64 0.59 0.57  CALCIUM 9.1 8.6* 8.6* 8.6* 9.1  MG 2.0 1.7  --   --   --    GFR: Estimated Creatinine Clearance: 62 mL/min (by C-G formula based on SCr of 0.57 mg/dL). Liver Function Tests: Recent Labs  Lab 02/21/20 1618 02/22/20 0748 02/23/20 0152 02/24/20 0334 02/25/20 0218  AST 12* 11* 9* 9* 11*  ALT 10 8 7 6 7   ALKPHOS 45 35* 39 38 40  BILITOT 1.3* 1.6* 1.3* 0.9 1.2  PROT 5.5* 4.7* 4.5* 4.5* 4.9*  ALBUMIN 3.5 2.8* 2.6* 2.6* 2.8*   No results for input(s): LIPASE, AMYLASE in the last 168 hours. No results for input(s): AMMONIA in the last 168 hours. Coagulation Profile: No results for input(s): INR, PROTIME in the last 168 hours. Cardiac Enzymes: No results for input(s): CKTOTAL, CKMB, CKMBINDEX, TROPONINI in the last 168 hours. BNP (last 3 results) No results for input(s): PROBNP in the last 8760 hours. HbA1C: No results for input(s): HGBA1C in the last 72 hours. CBG: No results for input(s): GLUCAP in the last 168 hours. Lipid Profile: No results for input(s): CHOL, HDL, LDLCALC, TRIG, CHOLHDL, LDLDIRECT in the last 72 hours. Thyroid Function Tests: No results for input(s): TSH, T4TOTAL, FREET4, T3FREE, THYROIDAB in the last 72 hours. Anemia Panel: Recent Labs    02/24/20 1516  VITAMINB12 857   Sepsis Labs: Recent Labs  Lab 02/21/20 1614  LATICACIDVEN 1.8    Recent Results (from the past 240 hour(s))  Culture, blood (routine x 2)     Status: None (Preliminary result)    Collection Time: 02/21/20  4:00 PM   Specimen: BLOOD LEFT ARM  Result Value Ref Range Status   Specimen Description BLOOD LEFT ARM  Final   Special Requests   Final    BOTTLES DRAWN AEROBIC AND ANAEROBIC Blood Culture results may not be optimal due to an inadequate volume of blood received in culture bottles   Culture   Final    NO GROWTH 4 DAYS Performed at [facility], [place] [address], [place], [place] [address]    Report Status PENDING  Incomplete  Culture, blood (routine x 2)     Status: None (  Preliminary result)   Collection Time: 02/21/20  4:04 PM   Specimen: BLOOD RIGHT ARM  Result Value Ref Range Status   Specimen Description BLOOD RIGHT ARM  Final   Special Requests   Final    BOTTLES DRAWN AEROBIC AND ANAEROBIC Blood Culture results may not be optimal due to an inadequate volume of blood received in culture bottles   Culture   Final    NO GROWTH 4 DAYS Performed at [facility], [place] [address], [place], [place] [address]    Report Status PENDING  Incomplete  Resp Panel by RT-PCR (Flu A&B, Covid) Nasopharyngeal Swab     Status: None   Collection Time: 02/21/20  4:05 PM   Specimen: Nasopharyngeal Swab; Nasopharyngeal(NP) swabs in vial transport medium  Result Value Ref Range Status   SARS Coronavirus 2 by RT PCR NEGATIVE NEGATIVE Final    Comment: (NOTE) SARS-CoV-2 target nucleic acids are NOT DETECTED.  The SARS-CoV-2 RNA is generally detectable in upper respiratory specimens during the acute phase of infection. The lowest concentration of SARS-CoV-2 viral copies this assay can detect is 138 copies/mL. A negative result does not preclude SARS-Cov-2 infection and should not be used as the sole basis for treatment or other patient management decisions. A negative result may occur with  improper specimen collection/handling, submission of specimen other than nasopharyngeal swab, presence of viral mutation(s) within the areas targeted by this assay, and  inadequate number of viral copies(<138 copies/mL). A negative result must be combined with clinical observations, patient history, and epidemiological information. The expected result is Negative.  Fact Sheet for Patients:  EntrepreneurPulse.com.au  Fact Sheet for Healthcare Providers:  IncredibleEmployment.be  This test is no t yet approved or cleared by the [place] FDA and  has been authorized for detection and/or diagnosis of SARS-CoV-2 by FDA under an Emergency Use Authorization (EUA). This EUA will remain  in effect (meaning this test can be used) for the duration of the COVID-19 declaration under Section 564(b)(1) of the Act, 21 U.S.C.section 360bbb-3(b)(1), unless the authorization is terminated  or revoked sooner.       Influenza A by PCR NEGATIVE NEGATIVE Final   Influenza B by PCR NEGATIVE NEGATIVE Final    Comment: (NOTE) The Xpert Xpress SARS-CoV-2/FLU/RSV plus assay is intended as an aid in the diagnosis of influenza from Nasopharyngeal swab specimens and should not be used as a sole basis for treatment. Nasal washings and aspirates are unacceptable for Xpert Xpress SARS-CoV-2/FLU/RSV testing.  Fact Sheet for Patients: EntrepreneurPulse.com.au  Fact Sheet for Healthcare Providers: IncredibleEmployment.be  This test is not yet approved or cleared by the [place] FDA and has been authorized for detection and/or diagnosis of SARS-CoV-2 by FDA under an Emergency Use Authorization (EUA). This EUA will remain in effect (meaning this test can be used) for the duration of the COVID-19 declaration under Section 564(b)(1) of the Act, 21 U.S.C. section 360bbb-3(b)(1), unless the authorization is terminated or revoked.  Performed at [facility], [place] [address], [place], [place] [address]   MRSA PCR Screening     Status: None   Collection Time: 02/22/20  4:53 AM   Specimen: Nasal  Mucosa; Nasopharyngeal  Result Value Ref Range Status   MRSA by PCR NEGATIVE NEGATIVE Final    Comment:        The GeneXpert MRSA Assay (FDA approved for NASAL specimens only), is one component of a comprehensive MRSA colonization surveillance program. It is not intended to diagnose  MRSA infection nor to guide or monitor treatment for MRSA infections. Performed at [facility], [place] [address], [place], [place] [address]   CSF culture     Status: None (Preliminary result)   Collection Time: 02/25/20  9:10 AM   Specimen: PATH Cytology CSF; Cerebrospinal Fluid  Result Value Ref Range Status   Specimen Description CSF  Final   Special Requests NONE  Final   Gram Stain   Final    WBC PRESENT, PREDOMINANTLY MONONUCLEAR NO ORGANISMS SEEN CYTOSPIN SMEAR Performed at [facility], [place] [address] [place], [place] [address]    Culture PENDING  Incomplete   Report Status PENDING  Incomplete     Radiology Studies: CT CERVICAL SPINE W CONTRAST  Result Date: 02/25/2020 CLINICAL DATA:  Generalized weakness/myelopathy FLUOROSCOPY TIME:  1.5 minutes.  4.4 mGy air kerma.  No exposures PROCEDURE: LUMBAR PUNCTURE FOR CERVICAL LUMBAR AND THORACIC MYELOGRAM CERVICAL AND LUMBAR AND THORACIC MYELOGRAM CT CERVICAL MYELOGRAM CT LUMBAR MYELOGRAM CT THORACIC MYELOGRAM Written and oral informed consent was obtained. Patient's Risperdal was held for 2 days and patient had a recent dose of her Xanax. Consent was obtained by Dr. Monte Fantasia. Patient was positioned prone on the fluoroscopy table. Local anesthesia was provided with 1% lidocaine without epinephrine after prepped and draped in the usual sterile fashion. Puncture was performed at L4-5 using a 3 1/2 inch 22-gauge spinal needle via midline approach through a laminectomy defect. Using a single pass through the dura, the needle was placed within the thecal sac, with return of clear CSF. As requested, CSF was obtained and was clear.  Approximately 11 cc was collected. 10 mL Omnipaque 300 was injected into the thecal sac, with normal opacification of the nerve roots and cauda equina consistent with free flow within the subarachnoid space. The patient was then moved to the trendelenburg position and contrast flowed into the Thoracic and Cervical spine regions. I personally performed the lumbar puncture and administered the intrathecal contrast. I also personally supervised acquisition of the myelogram images. TECHNIQUE: Contiguous axial images were obtained through the Cervical, Thoracic, and Lumbar spine after the intrathecal infusion of infusion. Coronal and sagittal reconstructions were obtained of the axial image sets. FINDINGS: CERVICAL AND LUMBAR MYELOGRAM FINDINGS: The patient is too weak for typical myelographic series. Myelogram was used to document flow of contrast into the upper cervical canal. There is no block or limitation of flow seen throughout the lumbar, thoracic, or cervical spine. CT CERVICAL MYELOGRAM FINDINGS: Comparison is made with cervical myelography 10/07/2019 Alignment: Physiologic. Vertebrae: C4-C6 ACDF with solid arthrodesis. Unremarkable hardware. No evidence of fracture or bone lesion. Cord: Normal morphology.  No abnormal intrathecal filling defect. Extra-spinal: Negative. Disc levels: Mild generalized facet spurring. Mild disc narrowing and ridging at C3-4 and C6-7. No cord impingement or high-grade foraminal stenosis. CT LUMBAR MYELOGRAM FINDINGS: Segmentation: 5 lumbar type vertebrae. Alignment: Fused anterolisthesis at L4-5. Vertebrae: Generalized osteopenic appearance. No focal or erosive finding. No evidence of osteomyelitis. A presumed right iliac bone harvest site is noted. Conus: Tip terminates at L1. The cauda equina appears smoothly contoured. Extra-spinal: Enteric contrast from recent modified barium swallow. Atherosclerosis. Disc levels: T12- L1: Unremarkable. L1-L2: Unremarkable. L2-L3: Disc narrowing  and mild bulging. Mild bilateral foraminal narrowing. L3-L4: Mild disc bulging and facet spurring. L4-L5: PLIF with solid arthrodesis and no visible impingement L5-S1:Mild disc bulging. Mild facet spurring. No neural compression. CT THORACIC MYELOGRAM FINDINGS: Alignment: Physiologic. Vertebrae: No evidence of fracture or bone lesion.  Generalized osteopenia Cord: Normal appearing thickness and shape. No evidence of intrathecal mass, collection, or adhesion. Extra-spinal: Dependent atelectasis that is mild. No perispinal mass or inflammation is seen. Disc levels: Generalized thoracic disc narrowing with mild endplate spurring. Tiny disc protrusions seen at T5-6, T6-7, T7-8, and T8-9. No significant facet degeneration. No neural impingement. IMPRESSION: Cervical: 1. Normal appearance of the cord. 2. C4-C6 ACDF with solid arthrodesis. No progressive adjacent segment degeneration or neural impingement. Thoracic: No impingement or intrathecal abnormality. Lumbar: 1. No neural compression or visible abnormality of the conus/cauda equina. 2. L4-5 PLIF with solid arthrodesis. 3. Osteopenia. Electronically Signed   By: Monte Fantasia M.D.   On: 02/25/2020 10:27   CT THORACIC SPINE W CONTRAST  Result Date: 02/25/2020 CLINICAL DATA:  Generalized weakness/myelopathy FLUOROSCOPY TIME:  1.5 minutes.  4.4 mGy air kerma.  No exposures PROCEDURE: LUMBAR PUNCTURE FOR CERVICAL LUMBAR AND THORACIC MYELOGRAM CERVICAL AND LUMBAR AND THORACIC MYELOGRAM CT CERVICAL MYELOGRAM CT LUMBAR MYELOGRAM CT THORACIC MYELOGRAM Written and oral informed consent was obtained. Patient's Risperdal was held for 2 days and patient had a recent dose of her Xanax. Consent was obtained by Dr. Monte Fantasia. Patient was positioned prone on the fluoroscopy table. Local anesthesia was provided with 1% lidocaine without epinephrine after prepped and draped in the usual sterile fashion. Puncture was performed at L4-5 using a 3 1/2 inch 22-gauge spinal needle  via midline approach through a laminectomy defect. Using a single pass through the dura, the needle was placed within the thecal sac, with return of clear CSF. As requested, CSF was obtained and was clear. Approximately 11 cc was collected. 10 mL Omnipaque 300 was injected into the thecal sac, with normal opacification of the nerve roots and cauda equina consistent with free flow within the subarachnoid space. The patient was then moved to the trendelenburg position and contrast flowed into the Thoracic and Cervical spine regions. I personally performed the lumbar puncture and administered the intrathecal contrast. I also personally supervised acquisition of the myelogram images. TECHNIQUE: Contiguous axial images were obtained through the Cervical, Thoracic, and Lumbar spine after the intrathecal infusion of infusion. Coronal and sagittal reconstructions were obtained of the axial image sets. FINDINGS: CERVICAL AND LUMBAR MYELOGRAM FINDINGS: The patient is too weak for typical myelographic series. Myelogram was used to document flow of contrast into the upper cervical canal. There is no block or limitation of flow seen throughout the lumbar, thoracic, or cervical spine. CT CERVICAL MYELOGRAM FINDINGS: Comparison is made with cervical myelography 10/07/2019 Alignment: Physiologic. Vertebrae: C4-C6 ACDF with solid arthrodesis. Unremarkable hardware. No evidence of fracture or bone lesion. Cord: Normal morphology.  No abnormal intrathecal filling defect. Extra-spinal: Negative. Disc levels: Mild generalized facet spurring. Mild disc narrowing and ridging at C3-4 and C6-7. No cord impingement or high-grade foraminal stenosis. CT LUMBAR MYELOGRAM FINDINGS: Segmentation: 5 lumbar type vertebrae. Alignment: Fused anterolisthesis at L4-5. Vertebrae: Generalized osteopenic appearance. No focal or erosive finding. No evidence of osteomyelitis. A presumed right iliac bone harvest site is noted. Conus: Tip terminates at L1.  The cauda equina appears smoothly contoured. Extra-spinal: Enteric contrast from recent modified barium swallow. Atherosclerosis. Disc levels: T12- L1: Unremarkable. L1-L2: Unremarkable. L2-L3: Disc narrowing and mild bulging. Mild bilateral foraminal narrowing. L3-L4: Mild disc bulging and facet spurring. L4-L5: PLIF with solid arthrodesis and no visible impingement L5-S1:Mild disc bulging. Mild facet spurring. No neural compression. CT THORACIC MYELOGRAM FINDINGS: Alignment: Physiologic. Vertebrae: No evidence of fracture or bone lesion. Generalized osteopenia Cord: Normal  appearing thickness and shape. No evidence of intrathecal mass, collection, or adhesion. Extra-spinal: Dependent atelectasis that is mild. No perispinal mass or inflammation is seen. Disc levels: Generalized thoracic disc narrowing with mild endplate spurring. Tiny disc protrusions seen at T5-6, T6-7, T7-8, and T8-9. No significant facet degeneration. No neural impingement. IMPRESSION: Cervical: 1. Normal appearance of the cord. 2. C4-C6 ACDF with solid arthrodesis. No progressive adjacent segment degeneration or neural impingement. Thoracic: No impingement or intrathecal abnormality. Lumbar: 1. No neural compression or visible abnormality of the conus/cauda equina. 2. L4-5 PLIF with solid arthrodesis. 3. Osteopenia. Electronically Signed   By: Monte Fantasia M.D.   On: 02/25/2020 10:27   CT LUMBAR SPINE W CONTRAST  Result Date: 02/25/2020 CLINICAL DATA:  Generalized weakness/myelopathy FLUOROSCOPY TIME:  1.5 minutes.  4.4 mGy air kerma.  No exposures PROCEDURE: LUMBAR PUNCTURE FOR CERVICAL LUMBAR AND THORACIC MYELOGRAM CERVICAL AND LUMBAR AND THORACIC MYELOGRAM CT CERVICAL MYELOGRAM CT LUMBAR MYELOGRAM CT THORACIC MYELOGRAM Written and oral informed consent was obtained. Patient's Risperdal was held for 2 days and patient had a recent dose of her Xanax. Consent was obtained by Dr. Monte Fantasia. Patient was positioned prone on the  fluoroscopy table. Local anesthesia was provided with 1% lidocaine without epinephrine after prepped and draped in the usual sterile fashion. Puncture was performed at L4-5 using a 3 1/2 inch 22-gauge spinal needle via midline approach through a laminectomy defect. Using a single pass through the dura, the needle was placed within the thecal sac, with return of clear CSF. As requested, CSF was obtained and was clear. Approximately 11 cc was collected. 10 mL Omnipaque 300 was injected into the thecal sac, with normal opacification of the nerve roots and cauda equina consistent with free flow within the subarachnoid space. The patient was then moved to the trendelenburg position and contrast flowed into the Thoracic and Cervical spine regions. I personally performed the lumbar puncture and administered the intrathecal contrast. I also personally supervised acquisition of the myelogram images. TECHNIQUE: Contiguous axial images were obtained through the Cervical, Thoracic, and Lumbar spine after the intrathecal infusion of infusion. Coronal and sagittal reconstructions were obtained of the axial image sets. FINDINGS: CERVICAL AND LUMBAR MYELOGRAM FINDINGS: The patient is too weak for typical myelographic series. Myelogram was used to document flow of contrast into the upper cervical canal. There is no block or limitation of flow seen throughout the lumbar, thoracic, or cervical spine. CT CERVICAL MYELOGRAM FINDINGS: Comparison is made with cervical myelography 10/07/2019 Alignment: Physiologic. Vertebrae: C4-C6 ACDF with solid arthrodesis. Unremarkable hardware. No evidence of fracture or bone lesion. Cord: Normal morphology.  No abnormal intrathecal filling defect. Extra-spinal: Negative. Disc levels: Mild generalized facet spurring. Mild disc narrowing and ridging at C3-4 and C6-7. No cord impingement or high-grade foraminal stenosis. CT LUMBAR MYELOGRAM FINDINGS: Segmentation: 5 lumbar type vertebrae. Alignment:  Fused anterolisthesis at L4-5. Vertebrae: Generalized osteopenic appearance. No focal or erosive finding. No evidence of osteomyelitis. A presumed right iliac bone harvest site is noted. Conus: Tip terminates at L1. The cauda equina appears smoothly contoured. Extra-spinal: Enteric contrast from recent modified barium swallow. Atherosclerosis. Disc levels: T12- L1: Unremarkable. L1-L2: Unremarkable. L2-L3: Disc narrowing and mild bulging. Mild bilateral foraminal narrowing. L3-L4: Mild disc bulging and facet spurring. L4-L5: PLIF with solid arthrodesis and no visible impingement L5-S1:Mild disc bulging. Mild facet spurring. No neural compression. CT THORACIC MYELOGRAM FINDINGS: Alignment: Physiologic. Vertebrae: No evidence of fracture or bone lesion. Generalized osteopenia Cord: Normal appearing thickness and shape.  No evidence of intrathecal mass, collection, or adhesion. Extra-spinal: Dependent atelectasis that is mild. No perispinal mass or inflammation is seen. Disc levels: Generalized thoracic disc narrowing with mild endplate spurring. Tiny disc protrusions seen at T5-6, T6-7, T7-8, and T8-9. No significant facet degeneration. No neural impingement. IMPRESSION: Cervical: 1. Normal appearance of the cord. 2. C4-C6 ACDF with solid arthrodesis. No progressive adjacent segment degeneration or neural impingement. Thoracic: No impingement or intrathecal abnormality. Lumbar: 1. No neural compression or visible abnormality of the conus/cauda equina. 2. L4-5 PLIF with solid arthrodesis. 3. Osteopenia. Electronically Signed   By: Monte Fantasia M.D.   On: 02/25/2020 10:27   DG MYELOGRAPHY LUMBAR INJ MULTI REGION  Result Date: 02/25/2020 CLINICAL DATA:  Generalized weakness/myelopathy FLUOROSCOPY TIME:  1.5 minutes.  4.4 mGy air kerma.  No exposures PROCEDURE: LUMBAR PUNCTURE FOR CERVICAL LUMBAR AND THORACIC MYELOGRAM CERVICAL AND LUMBAR AND THORACIC MYELOGRAM CT CERVICAL MYELOGRAM CT LUMBAR MYELOGRAM CT THORACIC  MYELOGRAM Written and oral informed consent was obtained. Patient's Risperdal was held for 2 days and patient had a recent dose of her Xanax. Consent was obtained by Dr. Monte Fantasia. Patient was positioned prone on the fluoroscopy table. Local anesthesia was provided with 1% lidocaine without epinephrine after prepped and draped in the usual sterile fashion. Puncture was performed at L4-5 using a 3 1/2 inch 22-gauge spinal needle via midline approach through a laminectomy defect. Using a single pass through the dura, the needle was placed within the thecal sac, with return of clear CSF. As requested, CSF was obtained and was clear. Approximately 11 cc was collected. 10 mL Omnipaque 300 was injected into the thecal sac, with normal opacification of the nerve roots and cauda equina consistent with free flow within the subarachnoid space. The patient was then moved to the trendelenburg position and contrast flowed into the Thoracic and Cervical spine regions. I personally performed the lumbar puncture and administered the intrathecal contrast. I also personally supervised acquisition of the myelogram images. TECHNIQUE: Contiguous axial images were obtained through the Cervical, Thoracic, and Lumbar spine after the intrathecal infusion of infusion. Coronal and sagittal reconstructions were obtained of the axial image sets. FINDINGS: CERVICAL AND LUMBAR MYELOGRAM FINDINGS: The patient is too weak for typical myelographic series. Myelogram was used to document flow of contrast into the upper cervical canal. There is no block or limitation of flow seen throughout the lumbar, thoracic, or cervical spine. CT CERVICAL MYELOGRAM FINDINGS: Comparison is made with cervical myelography 10/07/2019 Alignment: Physiologic. Vertebrae: C4-C6 ACDF with solid arthrodesis. Unremarkable hardware. No evidence of fracture or bone lesion. Cord: Normal morphology.  No abnormal intrathecal filling defect. Extra-spinal: Negative. Disc  levels: Mild generalized facet spurring. Mild disc narrowing and ridging at C3-4 and C6-7. No cord impingement or high-grade foraminal stenosis. CT LUMBAR MYELOGRAM FINDINGS: Segmentation: 5 lumbar type vertebrae. Alignment: Fused anterolisthesis at L4-5. Vertebrae: Generalized osteopenic appearance. No focal or erosive finding. No evidence of osteomyelitis. A presumed right iliac bone harvest site is noted. Conus: Tip terminates at L1. The cauda equina appears smoothly contoured. Extra-spinal: Enteric contrast from recent modified barium swallow. Atherosclerosis. Disc levels: T12- L1: Unremarkable. L1-L2: Unremarkable. L2-L3: Disc narrowing and mild bulging. Mild bilateral foraminal narrowing. L3-L4: Mild disc bulging and facet spurring. L4-L5: PLIF with solid arthrodesis and no visible impingement L5-S1:Mild disc bulging. Mild facet spurring. No neural compression. CT THORACIC MYELOGRAM FINDINGS: Alignment: Physiologic. Vertebrae: No evidence of fracture or bone lesion. Generalized osteopenia Cord: Normal appearing thickness and shape. No evidence of  intrathecal mass, collection, or adhesion. Extra-spinal: Dependent atelectasis that is mild. No perispinal mass or inflammation is seen. Disc levels: Generalized thoracic disc narrowing with mild endplate spurring. Tiny disc protrusions seen at T5-6, T6-7, T7-8, and T8-9. No significant facet degeneration. No neural impingement. IMPRESSION: Cervical: 1. Normal appearance of the cord. 2. C4-C6 ACDF with solid arthrodesis. No progressive adjacent segment degeneration or neural impingement. Thoracic: No impingement or intrathecal abnormality. Lumbar: 1. No neural compression or visible abnormality of the conus/cauda equina. 2. L4-5 PLIF with solid arthrodesis. 3. Osteopenia. Electronically Signed   By: Monte Fantasia M.D.   On: 02/25/2020 10:27    Scheduled Meds: . risperiDONE  3 mg Oral QHS  . vitamin B-12  500 mcg Oral Daily   Continuous Infusions:    LOS: 4  days   Domenic Polite, MD [facility] 02/25/2020, 2:40 PM

## 2020-02-25 NOTE — Plan of Care (Signed)
  Problem: Education: Goal: Knowledge of General Education information will improve Description Including pain rating scale, medication(s)/side effects and non-pharmacologic comfort measures Outcome: Progressing   

## 2020-02-25 NOTE — Procedures (Signed)
Uncomplicated LP at A1/9 with clear CSF.  Uncomplicated pan myelogram using 10cc omnipaque 300.  JWatts

## 2020-02-25 NOTE — Progress Notes (Signed)
Neurology Progress Note  S: weakness is no better than yesterday. I just got back from myelogram.   States she had some dysphagia after her old cervical fusion, so this is not new.   O: Current vital signs: BP (!) 92/54 (BP Location: Left Arm)   Pulse 60   Temp 98.4 F (36.9 C) (Oral)   Resp 16   Ht 5\' 7"  (1.702 m)   Wt 53.9 kg   SpO2 99%   BMI 18.61 kg/m  Vital signs in last 24 hours: Temp:  [98.4 F (36.9 C)-99.3 F (37.4 C)] 98.4 F (36.9 C) (12/09 0500) Pulse Rate:  [60-67] 60 (12/09 0500) Resp:  [16-18] 16 (12/09 0500) BP: (92-104)/(54-65) 92/54 (12/09 0500) SpO2:  [96 %-99 %] 99 % (12/09 0500) Weight:  [53.9 kg] 53.9 kg (12/09 0500) GENERAL: Awake, alert in NAD HEENT: Normocephalic and atraumatic LUNGS: normal effort.  CV: RRR Ext: warm  NEURO:  Mental Status: AA&Ox3  Language: speech is fluent. Naming, repetition, and comprehension intact.  Cranial Nerves: PERRL 97mm/brisk. EOMI. No ptosis. Sensation to light touch symmetrical to face. Hearing intact to voice..  Motor/Tone: decreased tone in LEs. Strength 4/5 on left, 4+/5 on right. No extremity fasciculations.  Sensation- Intact to light touch bilaterally to all extremities. Vibratory sense decreased.  Coordination: FNF intact.  DTRs-3+. Cross adductors present and upgoing toe on the left.  Gait- deferred. Pt is on bedrest  Medications  Current Facility-Administered Medications:  .  acetaminophen (TYLENOL) tablet 650 mg, 650 mg, Oral, Q6H PRN **OR** acetaminophen (TYLENOL) suppository 650 mg, 650 mg, Rectal, Q6H PRN, Howerter, Justin B, DO .  albuterol (VENTOLIN HFA) 108 (90 Base) MCG/ACT inhaler 1-2 puff, 1-2 puff, Inhalation, Q4H PRN, Howerter, Justin B, DO .  ALPRAZolam (XANAX) tablet 1 mg, 1 mg, Oral, BID PRN, Howerter, Justin B, DO, 1 mg at 02/25/20 0008 .  cyclobenzaprine (FLEXERIL) tablet 10 mg, 10 mg, Oral, QHS PRN, Howerter, Justin B, DO .  melatonin tablet 5 mg, 5 mg, Oral, QHS PRN, Shela Leff, MD, 5 mg at 02/24/20 2221 .  nicotine (NICODERM CQ - dosed in mg/24 hours) patch 21 mg, 21 mg, Transdermal, Daily PRN, Howerter, Justin B, DO .  ondansetron (ZOFRAN) injection 4 mg, 4 mg, Intravenous, Q6H PRN, Howerter, Justin B, DO .  risperiDONE (RISPERDAL) tablet 3 mg, 3 mg, Oral, QHS, Domenic Polite, MD .  vitamin B-12 (CYANOCOBALAMIN) tablet 500 mcg, 500 mcg, Oral, Daily, Howerter, Justin B, DO, 500 mcg at 02/25/20 0946  Labs-CSF studiesResults for COLENA, KETTERMAN (MRN [id number]) as of 02/25/2020 12:43  Ref. Range 02/25/2020 09:10  Appearance, CSF Latest Ref Range: CLEAR  CLEAR (A)  Glucose, CSF Latest Ref Range: 40 - 70 mg/dL 50  RBC Count, CSF Latest Ref Range: 0 /cu mm 1 (H)  WBC, CSF Latest Ref Range: 0 - 5 /cu mm 0  Other Cells, CSF Unknown TOO FEW TO COUNT, SMEAR AVAILABLE FOR REVIEW  Color, CSF Latest Ref Range: COLORLESS  COLORLESS  Supernatant Unknown NOT INDICATED  Total  Protein, CSF Latest Ref Range: 15 - 45 mg/dL 57 (H)  Tube # Unknown 3   Specimen Description CSF   Special Requests NONE   Gram Stain WBC PRESENT, PREDOMINANTLY MONONUCLEAR  NO ORGANISMS SEEN  CYTOSPIN SMEAR  Performed at [facility] [address] [place], [place] [address]   Culture PENDING   Report Status PENDING      Imaging Myelogram-Cervical:  1. Normal appearance of the cord.  2. C4-C6 ACDF with solid arthrodesis. No progressive adjacent segment degeneration or neural impingement.  Thoracic:  No impingement or intrathecal abnormality.  Lumbar:  1. No neural compression or visible abnormality of the conus/cauda equina. 2. L4-5 PLIF with solid arthrodesis. 3. Osteopenia.   Assessment: 64 yo female with progressive extremity weakness since September.  She has normal sensation, but with spasticity and distribution of her weakness I am concerned for myelopathy.  Her myelogram shows no structural reason for her symptoms. The borderline protein level's  significance is unclear at this time. IGg is pending. This presentation could quite possibly be due to Vit B12 deficiency.  She continues to not eat well, and I think it may be combination of B12 deficiency and deconditioning.  With her dysphagia being old, I am less concerned about motor neuron disease, given that I do not have any evidence of lower motor neuron dysfunction.  I still think an EMG would be reasonable, however.   Recommendations: -continue Vit B12 supplementation. -Await all CSF studies and lab-IGg, copper, Vit E, Ace -needs EMG/NCS as an outpt. F/up with Dr. Felecia Shelling -agree with SNF rehab where she can get more aggressive PT/OT.    Clance Boll, MSN, APN-BC/Nurse Practitioner. Pt seen by NP and later by MD. MD will edit note/plan as needed.    I have seen the patient reviewed the above note.  The plan and assessment were jointly developed.  She has a myelopathy, with recently diagnosed B12 deficiency.  At this time, I would recommend aggressive rehab and outpatient EMG.  No further recommendations on an inpatient basis.  Neurology will continue to be available on an as-needed basis.  Roland Rack, MD [facility] [phone]  If 7pm- 7am, please page neurology on call as listed in [place].

## 2020-02-25 NOTE — Progress Notes (Signed)
PT Cancellation Note  Patient Details Name: [name] MRN: [id number] DOB: 09-06-1955   Cancelled Treatment:    Reason Eval/Treat Not Completed: Patient at procedure or test/unavailable   [name] B [name] 02/25/2020, 8:17 AM  [name], PT Acute Rehabilitation Services Pager: [phone] Office: [phone]

## 2020-02-25 NOTE — Progress Notes (Signed)
Physical Therapy Treatment Patient Details Name: [name] MRN: [id number] DOB: [birth year] Today's Date: 02/25/2020    History of Present Illness [name] is a 64 y.o. 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     PT Comments    Pt moving remarkably well this session and able to walk in hall 130'. Pt reports this is improved from her baseline and that daughter can stay with her more and at night. Pt reports she would prefer to go home if daughter is available and she can maintain her strength.Pt educated for HEP, continued mobility with nursing staff and encouraged to be up to the bathroom throughout the day.     Follow Up Recommendations  Home health PT;SNF;Supervision for mobility/OOB (if family can provide additional assist HHPT)     Equipment Recommendations  Rolling walker with 5" wheels    Recommendations for Other Services       Precautions / Restrictions Precautions Precautions: Fall    Mobility  Bed Mobility Overal bed mobility: Modified Independent             General bed mobility comments: pt able to transition from supine to sitting without assist  Transfers Overall transfer level: Needs assistance     Sit to Stand: Min guard         General transfer comment: cues for hand placement with pt able to stand from bed and to chair  Ambulation/Gait Ambulation/Gait assistance: Min guard Gait Distance (Feet): 130 Feet Assistive device: Rolling walker (2 wheeled) Gait Pattern/deviations: Step-through pattern;Decreased stride length;Narrow base of support   Gait velocity interpretation: 1.31 - 2.62 ft/sec, indicative of limited community  ambulator General Gait Details: cues for direction with pt appropriately directing activity tolerance with decreased speed   Stairs             Wheelchair Mobility    Modified Rankin (Stroke Patients Only)       Balance Overall balance assessment: Needs assistance   Sitting balance-Leahy Scale: Good     Standing balance support: Bilateral upper extremity supported Standing balance-Leahy Scale: Poor Standing balance comment: bil UE support on RW for standing                            Cognition Arousal/Alertness: Awake/alert Behavior During Therapy: Flat affect Overall Cognitive Status: Within Functional Limits for tasks assessed                                        Exercises General Exercises - Lower Extremity Long Arc Quad: AROM;Both;Seated;10 reps Hip ABduction/ADduction: AROM;Both;Seated Hip Flexion/Marching: AROM;Both;15 reps;Seated    General Comments        Pertinent Vitals/Pain Pain Assessment: No/denies pain    Home Living                      Prior Function            PT Goals (current goals can now be found in the care plan section) Progress towards PT goals: Progressing toward goals    Frequency    Min 3X/week  PT Plan Discharge plan needs to be updated    Co-evaluation              AM-PAC PT "6 Clicks" Mobility   Outcome Measure  Help needed turning from your back to your side while in a flat bed without using bedrails?: None Help needed moving from lying on your back to sitting on the side of a flat bed without using bedrails?: None Help needed moving to and from a bed to a chair (including a wheelchair)?: A Little Help needed standing up from a chair using your arms (e.g., wheelchair or bedside chair)?: A Little Help needed to walk in hospital room?: A Little Help needed climbing 3-5 steps with a railing? : A Little 6 Click Score: 20    End of Session Equipment Utilized  During Treatment: Gait belt Activity Tolerance: Patient tolerated treatment well Patient left: in chair;with call bell/phone within reach;with chair alarm set Nurse Communication: Mobility status PT Visit Diagnosis: Muscle weakness (generalized) (M62.81);Difficulty in walking, not elsewhere classified (R26.2);Other abnormalities of gait and mobility (R26.89)     Time: 1610-9604 PT Time Calculation (min) (ACUTE ONLY): 22 min  Charges:  $Gait Training: 8-22 mins                     [name], PT Acute Rehabilitation Services Pager: [phone] Office: [place] 02/25/2020, 1:02 PM

## 2020-02-25 NOTE — Progress Notes (Signed)
OT Cancellation Note  Patient Details Name: DAMONIE FURNEY MRN: [id number] DOB: 1955-11-18   Cancelled Treatment:    Reason Eval/Treat Not Completed: Medical issues which prohibited therapy. Pt on bedrest s/p lumbar puncture.  Tyrone Schimke, OT Acute Rehabilitation Services Pager: [phone] Office: [phone]  02/25/2020, 10:35 AM

## 2020-02-25 NOTE — Plan of Care (Signed)

## 2020-02-25 NOTE — Telephone Encounter (Signed)
Spoke with patient regarding appointment with Dr. Lamonte Sakai [facility] Pulmonary) scheduled Monday 03/28/20 at 11:30 am---arrival time is 11:00 am---[address]---phone [phone].  Will mail information to patient and it is available in My Chart.  Patient voiced her understanding.

## 2020-02-25 NOTE — Progress Notes (Signed)
  Speech Language Pathology Treatment: Dysphagia  Patient Details Name: [name] MRN: [id number] DOB: [birth year] Today's Date: 02/25/2020 Time: 5732-2025 SLP Time Calculation (min) (ACUTE ONLY): 10 min  Assessment / Plan / Recommendation Clinical Impression  F/u after yesterday's MBS. Reviewed results and precautions. Pt mod I to recall strategies. She was observed with POs and demonstrated no concerns for airway invasion nor did she complain about items lodging in throat.  Recommend continued regular diet, thin liquids, meds whole in puree.  Follow briefly for education.   HPI HPI: 64 y.o. 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 Admitted with concerns for PNA.  According to her nurse, the evening of 12/6 she had frequent coughing episodes while eating soup, hence SLP swallow eval was ordered. Upon chart review, pt was seen by [facility],  Dr. Felecia Shelling, on 11/22.  He ordered an OP MBS which has been scheduled for 12/13 at [facility]. She has had progressive generalized weakness the last two months, as well as worsening swallow function.  There were concerns re: r/o myasthenia gravis and w/u was underway.  She is scheduled for a neuro f/u in six months.       SLP Plan  Continue with current plan of care       Recommendations  Diet recommendations: Regular;Thin liquid Liquids provided via: Cup Medication Administration: Whole meds with puree                Oral Care Recommendations: Oral care BID Follow up Recommendations: Inpatient Rehab SLP Visit Diagnosis: Dysphagia, oropharyngeal phase (R13.12) Plan: Continue with current plan of care       GO                 [name] 02/25/2020, 2:45 PM  [name] L. Tivis Ringer, [place] Office number [phone] Pager [phone]

## 2020-02-26 LAB — METHYLMALONIC ACID, SERUM: Methylmalonic Acid, Quantitative: 97 nmol/L (ref 0–378)

## 2020-02-26 LAB — CULTURE, BLOOD (ROUTINE X 2)
Culture: NO GROWTH
Culture: NO GROWTH

## 2020-02-26 LAB — IGG CSF INDEX
Albumin CSF-mCnc: 43 mg/dL — ABNORMAL HIGH (ref 8–37)
Albumin: 3.4 g/dL — ABNORMAL LOW (ref 3.8–4.8)
CSF IgG Index: 0.6 (ref 0.0–0.7)
IgG (Immunoglobin G), Serum: 569 mg/dL — ABNORMAL LOW (ref 586–1602)
IgG, CSF: 4 mg/dL (ref 0.0–6.7)
IgG/Alb Ratio, CSF: 0.09 (ref 0.00–0.25)

## 2020-02-26 LAB — COPPER, SERUM: Copper: 104 ug/dL (ref 80–158)

## 2020-02-26 LAB — SARS CORONAVIRUS 2 BY RT PCR (HOSPITAL ORDER, PERFORMED IN ~~LOC~~ HOSPITAL LAB): SARS Coronavirus 2: NEGATIVE

## 2020-02-26 LAB — CYTOLOGY - NON PAP

## 2020-02-26 MED ORDER — CYANOCOBALAMIN 1000 MCG PO TABS
1000.0000 ug | ORAL_TABLET | Freq: Every day | ORAL | Status: DC
Start: 2020-02-27 — End: 2021-03-08

## 2020-02-26 MED ORDER — VITAMIN B-12 1000 MCG PO TABS
1000.0000 ug | ORAL_TABLET | Freq: Every day | ORAL | Status: DC
  Administered 2020-02-27: 1000 ug via ORAL
  Filled 2020-02-26: qty 1

## 2020-02-26 MED ORDER — ACETAMINOPHEN 325 MG PO TABS: 650.0000 mg | ORAL_TABLET | Freq: Four times a day (QID) | ORAL | Status: DC | PRN

## 2020-02-26 NOTE — TOC Progression Note (Addendum)
Transition of Care [facility]) - Progression Note    Patient Details  Name: [name] MRN: [id number] Date of Birth: 1956-01-19  Transition of Care [facility]) CM/SW Contact  Joanne Chars, LCSW Phone Number: 02/26/2020, 1:16 PM  Clinical Narrative:   CSW contacted [place] at [facility].  Holli Humbles still pending.   1610: call from [place] from [facility].  They expect to have auth shortly, can admit over the weekend.  Requesting covid test.  MD notified.    Expected Discharge Plan: [place] Barriers to Discharge: Continued Medical Work up  Expected Discharge Plan and Services Expected Discharge Plan: [place] In-house Referral: Clinical Social Work Discharge Planning Services: CM Consult Post Acute Care Choice: [place] arrangements for the past 2 months: Single Family Home                                       Social Determinants of Health (SDOH) Interventions    Readmission Risk Interventions No flowsheet data found.

## 2020-02-26 NOTE — Progress Notes (Signed)
Daughter at bedside. Has questions regarding DC, labs, diagnosis etc.  MD was able to call and speak to her to assist with answering questions.

## 2020-02-26 NOTE — Discharge Summary (Addendum)
Physician Discharge Summary  [name] ZWC:[id number] DOB: 08-13-1955 DOA: 02/21/2020  PCP: Lujean Amel, MD  Admit date: 02/21/2020 Discharge date: 02/26/2020  Time spent: 35 minutes  Recommendations for Outpatient Follow-up:  1. SNF for rehabilitation 2. Neurologist Dr. Felecia Shelling in 2 weeks, needs EMG/nerve conduction studies, also please follow-up all final CSF studies, copper vitamin E and ACE level   Discharge Diagnoses:  Principal Problem: Bilateral lower extremity weakness Gait disorder   Tobacco abuse   B12 deficiency   Cough   Generalized weakness   GAD (generalized anxiety disorder)   Discharge Condition: Stable  Diet recommendation: Regular  Filed Weights   02/22/20 0450 02/25/20 0500  Weight: 54 kg 53.9 kg    History of present illness:  64 y.o.femalewith medical history significant forvitamin B12 deficiency, tobacco abuse, depression, anxiety, 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 Felecia Shelling as outpatient -B12 level was low at 146,  2 months ago, was started on replacement however did not have significant recovery -Outpatient myasthenia gravis work-up was negative so far -B12 level is normal now, MMA is pending, continue vitamin D supplementation -Neurology consulted, LP-mostly unremarkable, CT cervical thoracic and lumbar spine without evidence of cord impingement  -Neurologist Dr. Leonel Ramsay felt that based on her multiple CTs and myelogram there are no structural reasons for her symptoms, high borderline protein in CSF was of questionable significance, he thinks her presentation could still be related to B12  deficiency and deconditioning. -He recommended to continue vitamin B12 supplementation, follow-up on all CSF studies, copper level, vitamin D and ACE level -Also recommended to follow-up with her neurologist Dr. Felecia Shelling in 1 to 2 weeks for EMG/nerve conduction studies -She will be discharged to SNF for rehabilitation  Doubt healthcare associated pneumonia -Initial chest x-ray was concerning for atelectasis versus infiltrate -Patient has no pulmonary symptoms, no cough -Antibiotics discontinued immediately, stable -reportedly exposed to Covid about a week ago, no symptoms-Covid Negative on admission 12/5  Tobacco abuse -Counseled  Moderate protein calorie malnutrition -Ongoing weight loss, temporal wasting noted, supplements as tolerated -Encouraged p.o. intake   Anxiety Bipolar disorder -Continue risperidone and Xanax per home regimen  Procedures:  LP  Consultations:  Neurology Dr. Leonel Ramsay  Discharge Exam: Vitals:   02/26/20 1108 02/26/20 1109  BP: 95/69 95/69  Pulse:  74  Resp: 19 19  Temp: 98.7 F (37.1 C) 98.5 F (36.9 C)  SpO2: 98% 98%    General: AAOx3, no distress, flat affect Cardiovascular: S1-S2, regular rate rhythm Respiratory: Clear  Discharge Instructions    Allergies as of 02/26/2020      Reactions   Penicillins Other (See Comments)   Yeast infection Did it involve swelling of the face/tongue/throat, SOB, or low BP? No Did it involve sudden or severe rash/hives, skin peeling, or any reaction on the inside of your mouth or nose? No Did you need to seek medical attention at a hospital or doctor's office? Yes When did it last happen? More than 5 years ago If all above answers are "NO", may proceed with cephalosporin use.      Medication List    TAKE these medications   acetaminophen 325 MG tablet Commonly known as: TYLENOL Take 2 tablets (  650 mg total) by mouth every 6 (six) hours as needed for mild pain (or Fever >/= 101).    ALPRAZolam 1 MG tablet Commonly known as: Xanax 1 po bid prn and occas extra 1 po mid-day. What changed:   how much to take  how to take this  when to take this  additional instructions   cyanocobalamin 1000 MCG tablet Take 1 tablet (1,000 mcg total) by mouth daily. Start taking on: February 27, 2020 What changed:   medication strength  how much to take   cyclobenzaprine 10 MG tablet Commonly known as: FLEXERIL Take 10 mg by mouth at bedtime.   risperiDONE 3 MG tablet Commonly known as: RISPERDAL Take 1 tablet (3 mg total) by mouth at bedtime.      Allergies  Allergen Reactions  . Penicillins Other (See Comments)    Yeast infection Did it involve swelling of the face/tongue/throat, SOB, or low BP? No Did it involve sudden or severe rash/hives, skin peeling, or any reaction on the inside of your mouth or nose? No Did you need to seek medical attention at a hospital or doctor's office? Yes When did it last happen? More than 5 years ago If all above answers are "NO", may proceed with cephalosporin use.       The results of significant diagnostics from this hospitalization (including imaging, microbiology, ancillary and laboratory) are listed below for reference.    Significant Diagnostic Studies: DG Chest 2 View  Result Date: 02/21/2020 CLINICAL DATA:  Altered mental status. EXAM: CHEST - 2 VIEW COMPARISON:  01/01/2020 FINDINGS: Right lung clear. There is retrocardiac left base atelectasis or infiltrate. Cardiopericardial silhouette is at upper limits of normal for size. The visualized bony structures of the thorax show no acute abnormality. Telemetry leads overlie the chest. IMPRESSION: Retrocardiac atelectasis or infiltrate. Electronically Signed   By: Misty Stanley M.D.   On: 02/21/2020 15:30   CT Head Wo Contrast  Result Date: 02/21/2020 CLINICAL DATA:  Mental status change of unknown cause. EXAM: CT HEAD WITHOUT CONTRAST TECHNIQUE: Contiguous axial images  were obtained from the base of the skull through the vertex without intravenous contrast. COMPARISON:  May 26, 2019 FINDINGS: Brain: No subdural, epidural, or subarachnoid hemorrhage. Ventricles and sulci are stable. No acute cortical ischemia infarct. No mass effect or midline shift. Cerebellum, brainstem, and basal cisterns are unremarkable. Vascular: No hyperdense vessel or unexpected calcification. Skull: Normal. Negative for fracture or focal lesion. Sinuses/Orbits: No acute finding. Other: None. IMPRESSION: No acute intracranial abnormalities. Electronically Signed   By: Dorise Bullion III M.D   On: 02/21/2020 16:45   CT Chest Wo Contrast  Result Date: 02/21/2020 CLINICAL DATA:  Weakness, hypotension, multiple falls EXAM: CT CHEST WITHOUT CONTRAST TECHNIQUE: Multidetector CT imaging of the chest was performed following the standard protocol without IV contrast. COMPARISON:  02/21/2020, 04/20/2019 FINDINGS: Cardiovascular: Heart is unremarkable without pericardial effusion. Normal caliber of the thoracic aorta. Minimal atherosclerosis of the aortic arch. Dual lead pacemaker unchanged. Mediastinum/Nodes: No enlarged mediastinal or axillary lymph nodes. Thyroid gland, trachea, and esophagus demonstrate no significant findings. Lungs/Pleura: Left lower lobe consolidation is identified, corresponding to chest x-ray findings. Favor left lower lobe bronchopneumonia over atelectasis. Mild background emphysema. No effusion or pneumothorax. The central airways are patent. Upper Abdomen: No acute abnormality. Musculoskeletal: No acute or destructive bony lesions. Reconstructed images demonstrate no additional findings. IMPRESSION: 1. Left lower lobe consolidation favor bronchopneumonia. 2. Aortic Atherosclerosis (ICD10-I70.0) and Emphysema (ICD10-J43.9). Electronically Signed   By: Legrand Como  Owens Shark M.D.   On: 02/21/2020 19:05   CT CERVICAL SPINE W CONTRAST  Result Date: 02/25/2020 CLINICAL DATA:  Generalized  weakness/myelopathy FLUOROSCOPY TIME:  1.5 minutes.  4.4 mGy air kerma.  No exposures PROCEDURE: LUMBAR PUNCTURE FOR CERVICAL LUMBAR AND THORACIC MYELOGRAM CERVICAL AND LUMBAR AND THORACIC MYELOGRAM CT CERVICAL MYELOGRAM CT LUMBAR MYELOGRAM CT THORACIC MYELOGRAM Written and oral informed consent was obtained. Patient's Risperdal was held for 2 days and patient had a recent dose of her Xanax. Consent was obtained by Dr. Monte Fantasia. Patient was positioned prone on the fluoroscopy table. Local anesthesia was provided with 1% lidocaine without epinephrine after prepped and draped in the usual sterile fashion. Puncture was performed at L4-5 using a 3 1/2 inch 22-gauge spinal needle via midline approach through a laminectomy defect. Using a single pass through the dura, the needle was placed within the thecal sac, with return of clear CSF. As requested, CSF was obtained and was clear. Approximately 11 cc was collected. 10 mL Omnipaque 300 was injected into the thecal sac, with normal opacification of the nerve roots and cauda equina consistent with free flow within the subarachnoid space. The patient was then moved to the trendelenburg position and contrast flowed into the Thoracic and Cervical spine regions. I personally performed the lumbar puncture and administered the intrathecal contrast. I also personally supervised acquisition of the myelogram images. TECHNIQUE: Contiguous axial images were obtained through the Cervical, Thoracic, and Lumbar spine after the intrathecal infusion of infusion. Coronal and sagittal reconstructions were obtained of the axial image sets. FINDINGS: CERVICAL AND LUMBAR MYELOGRAM FINDINGS: The patient is too weak for typical myelographic series. Myelogram was used to document flow of contrast into the upper cervical canal. There is no block or limitation of flow seen throughout the lumbar, thoracic, or cervical spine. CT CERVICAL MYELOGRAM FINDINGS: Comparison is made with cervical  myelography 10/07/2019 Alignment: Physiologic. Vertebrae: C4-C6 ACDF with solid arthrodesis. Unremarkable hardware. No evidence of fracture or bone lesion. Cord: Normal morphology.  No abnormal intrathecal filling defect. Extra-spinal: Negative. Disc levels: Mild generalized facet spurring. Mild disc narrowing and ridging at C3-4 and C6-7. No cord impingement or high-grade foraminal stenosis. CT LUMBAR MYELOGRAM FINDINGS: Segmentation: 5 lumbar type vertebrae. Alignment: Fused anterolisthesis at L4-5. Vertebrae: Generalized osteopenic appearance. No focal or erosive finding. No evidence of osteomyelitis. A presumed right iliac bone harvest site is noted. Conus: Tip terminates at L1. The cauda equina appears smoothly contoured. Extra-spinal: Enteric contrast from recent modified barium swallow. Atherosclerosis. Disc levels: T12- L1: Unremarkable. L1-L2: Unremarkable. L2-L3: Disc narrowing and mild bulging. Mild bilateral foraminal narrowing. L3-L4: Mild disc bulging and facet spurring. L4-L5: PLIF with solid arthrodesis and no visible impingement L5-S1:Mild disc bulging. Mild facet spurring. No neural compression. CT THORACIC MYELOGRAM FINDINGS: Alignment: Physiologic. Vertebrae: No evidence of fracture or bone lesion. Generalized osteopenia Cord: Normal appearing thickness and shape. No evidence of intrathecal mass, collection, or adhesion. Extra-spinal: Dependent atelectasis that is mild. No perispinal mass or inflammation is seen. Disc levels: Generalized thoracic disc narrowing with mild endplate spurring. Tiny disc protrusions seen at T5-6, T6-7, T7-8, and T8-9. No significant facet degeneration. No neural impingement. IMPRESSION: Cervical: 1. Normal appearance of the cord. 2. C4-C6 ACDF with solid arthrodesis. No progressive adjacent segment degeneration or neural impingement. Thoracic: No impingement or intrathecal abnormality. Lumbar: 1. No neural compression or visible abnormality of the conus/cauda equina.  2. L4-5 PLIF with solid arthrodesis. 3. Osteopenia. Electronically Signed   By: Neva Seat.D.  On: 02/25/2020 10:27   CT THORACIC SPINE W CONTRAST  Result Date: 02/25/2020 CLINICAL DATA:  Generalized weakness/myelopathy FLUOROSCOPY TIME:  1.5 minutes.  4.4 mGy air kerma.  No exposures PROCEDURE: LUMBAR PUNCTURE FOR CERVICAL LUMBAR AND THORACIC MYELOGRAM CERVICAL AND LUMBAR AND THORACIC MYELOGRAM CT CERVICAL MYELOGRAM CT LUMBAR MYELOGRAM CT THORACIC MYELOGRAM Written and oral informed consent was obtained. Patient's Risperdal was held for 2 days and patient had a recent dose of her Xanax. Consent was obtained by Dr. Monte Fantasia. Patient was positioned prone on the fluoroscopy table. Local anesthesia was provided with 1% lidocaine without epinephrine after prepped and draped in the usual sterile fashion. Puncture was performed at L4-5 using a 3 1/2 inch 22-gauge spinal needle via midline approach through a laminectomy defect. Using a single pass through the dura, the needle was placed within the thecal sac, with return of clear CSF. As requested, CSF was obtained and was clear. Approximately 11 cc was collected. 10 mL Omnipaque 300 was injected into the thecal sac, with normal opacification of the nerve roots and cauda equina consistent with free flow within the subarachnoid space. The patient was then moved to the trendelenburg position and contrast flowed into the Thoracic and Cervical spine regions. I personally performed the lumbar puncture and administered the intrathecal contrast. I also personally supervised acquisition of the myelogram images. TECHNIQUE: Contiguous axial images were obtained through the Cervical, Thoracic, and Lumbar spine after the intrathecal infusion of infusion. Coronal and sagittal reconstructions were obtained of the axial image sets. FINDINGS: CERVICAL AND LUMBAR MYELOGRAM FINDINGS: The patient is too weak for typical myelographic series. Myelogram was used to document  flow of contrast into the upper cervical canal. There is no block or limitation of flow seen throughout the lumbar, thoracic, or cervical spine. CT CERVICAL MYELOGRAM FINDINGS: Comparison is made with cervical myelography 10/07/2019 Alignment: Physiologic. Vertebrae: C4-C6 ACDF with solid arthrodesis. Unremarkable hardware. No evidence of fracture or bone lesion. Cord: Normal morphology.  No abnormal intrathecal filling defect. Extra-spinal: Negative. Disc levels: Mild generalized facet spurring. Mild disc narrowing and ridging at C3-4 and C6-7. No cord impingement or high-grade foraminal stenosis. CT LUMBAR MYELOGRAM FINDINGS: Segmentation: 5 lumbar type vertebrae. Alignment: Fused anterolisthesis at L4-5. Vertebrae: Generalized osteopenic appearance. No focal or erosive finding. No evidence of osteomyelitis. A presumed right iliac bone harvest site is noted. Conus: Tip terminates at L1. The cauda equina appears smoothly contoured. Extra-spinal: Enteric contrast from recent modified barium swallow. Atherosclerosis. Disc levels: T12- L1: Unremarkable. L1-L2: Unremarkable. L2-L3: Disc narrowing and mild bulging. Mild bilateral foraminal narrowing. L3-L4: Mild disc bulging and facet spurring. L4-L5: PLIF with solid arthrodesis and no visible impingement L5-S1:Mild disc bulging. Mild facet spurring. No neural compression. CT THORACIC MYELOGRAM FINDINGS: Alignment: Physiologic. Vertebrae: No evidence of fracture or bone lesion. Generalized osteopenia Cord: Normal appearing thickness and shape. No evidence of intrathecal mass, collection, or adhesion. Extra-spinal: Dependent atelectasis that is mild. No perispinal mass or inflammation is seen. Disc levels: Generalized thoracic disc narrowing with mild endplate spurring. Tiny disc protrusions seen at T5-6, T6-7, T7-8, and T8-9. No significant facet degeneration. No neural impingement. IMPRESSION: Cervical: 1. Normal appearance of the cord. 2. C4-C6 ACDF with solid  arthrodesis. No progressive adjacent segment degeneration or neural impingement. Thoracic: No impingement or intrathecal abnormality. Lumbar: 1. No neural compression or visible abnormality of the conus/cauda equina. 2. L4-5 PLIF with solid arthrodesis. 3. Osteopenia. Electronically Signed   By: Monte Fantasia M.D.   On: 02/25/2020 10:27  CT LUMBAR SPINE WO CONTRAST  Result Date: 02/22/2020 CLINICAL DATA:  64 year old female with low back pain, marked lower extremity weakness, prior surgery. EXAM: CT LUMBAR SPINE WITHOUT CONTRAST TECHNIQUE: Multidetector CT imaging of the lumbar spine was performed without intravenous contrast administration. Multiplanar CT image reconstructions were also generated. COMPARISON:  Lumbar spine CT 01/06/2019. FINDINGS: Segmentation: Normal, the same numbering system used on the CT last year. Alignment: Mild chronic grade 1 anterolisthesis at L4-L5 is stable, postoperative changes at that level detailed below. Mildly improved lumbar lordosis elsewhere from the prior CT. Vertebrae: Postoperative details are below. Stable background bone mineralization, including with probable benign T12 body hemangioma, and similar stable lucent area in the medial right iliac bone (series 4, image 117). No acute osseous abnormality identified. SI joints appear intact. Paraspinal and other soft tissues: Stable, negative visible noncontrast abdominal viscera. Aortoiliac calcified atherosclerosis. Postoperative changes to the posterior paraspinal soft tissues with no adverse features. Disc levels: T11-T12: Negative. T12-L1:  Negative. L1-L2:  Stable mild disc bulge. L2-L3: Chronic disc space loss. Subtle retrolisthesis. Circumferential disc bulge and mild posterior element hypertrophy. No convincing spinal stenosis. Borderline to mild bilateral L2 foraminal stenosis is stable. L3-L4: Chronic circumferential disc bulge. Moderate facet and ligament flavum hypertrophy. No convincing spinal or foraminal  stenosis. L4-L5: Prior decompression and fusion. Interbody implant and bilateral pedicle screws appear intact. No evidence of hardware loosening. Solid-appearing posterior element arthrodesis on the left. Solid-appearing left far lateral endplate arthrodesis. Hardware streak artifact limits detail of the thecal sac. No definite stenosis. L5-S1: Circumferential disc bulge with broad-based central disc protrusion. Chronic epidural lipomatosis at this level effaces CSF from thecal sac. Moderate facet and ligament flavum hypertrophy. Moderate left and mild right L5 foraminal stenosis appears stable. IMPRESSION: 1. Prior decompression and fusion at L4-L5 with stable arthrodesis since last year and no adverse features. 2. 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 Moderate left L5 foraminal stenosis suspected. 3. No acute osseous abnormality.  Other lumbar levels remain stable. Electronically Signed   By: Genevie Ann M.D.   On: 02/22/2020 20:18   CT LUMBAR SPINE W CONTRAST  Result Date: 02/25/2020 CLINICAL DATA:  Generalized weakness/myelopathy FLUOROSCOPY TIME:  1.5 minutes.  4.4 mGy air kerma.  No exposures PROCEDURE: LUMBAR PUNCTURE FOR CERVICAL LUMBAR AND THORACIC MYELOGRAM CERVICAL AND LUMBAR AND THORACIC MYELOGRAM CT CERVICAL MYELOGRAM CT LUMBAR MYELOGRAM CT THORACIC MYELOGRAM Written and oral informed consent was obtained. Patient's Risperdal was held for 2 days and patient had a recent dose of her Xanax. Consent was obtained by Dr. Monte Fantasia. Patient was positioned prone on the fluoroscopy table. Local anesthesia was provided with 1% lidocaine without epinephrine after prepped and draped in the usual sterile fashion. Puncture was performed at L4-5 using a 3 1/2 inch 22-gauge spinal needle via midline approach through a laminectomy defect. Using a single pass through the dura, the needle was placed within the thecal sac, with return of clear CSF. As requested, CSF  was obtained and was clear. Approximately 11 cc was collected. 10 mL Omnipaque 300 was injected into the thecal sac, with normal opacification of the nerve roots and cauda equina consistent with free flow within the subarachnoid space. The patient was then moved to the trendelenburg position and contrast flowed into the Thoracic and Cervical spine regions. I personally performed the lumbar puncture and administered the intrathecal contrast. I also personally supervised acquisition of the myelogram images. TECHNIQUE: Contiguous axial images were obtained through  the Cervical, Thoracic, and Lumbar spine after the intrathecal infusion of infusion. Coronal and sagittal reconstructions were obtained of the axial image sets. FINDINGS: CERVICAL AND LUMBAR MYELOGRAM FINDINGS: The patient is too weak for typical myelographic series. Myelogram was used to document flow of contrast into the upper cervical canal. There is no block or limitation of flow seen throughout the lumbar, thoracic, or cervical spine. CT CERVICAL MYELOGRAM FINDINGS: Comparison is made with cervical myelography 10/07/2019 Alignment: Physiologic. Vertebrae: C4-C6 ACDF with solid arthrodesis. Unremarkable hardware. No evidence of fracture or bone lesion. Cord: Normal morphology.  No abnormal intrathecal filling defect. Extra-spinal: Negative. Disc levels: Mild generalized facet spurring. Mild disc narrowing and ridging at C3-4 and C6-7. No cord impingement or high-grade foraminal stenosis. CT LUMBAR MYELOGRAM FINDINGS: Segmentation: 5 lumbar type vertebrae. Alignment: Fused anterolisthesis at L4-5. Vertebrae: Generalized osteopenic appearance. No focal or erosive finding. No evidence of osteomyelitis. A presumed right iliac bone harvest site is noted. Conus: Tip terminates at L1. The cauda equina appears smoothly contoured. Extra-spinal: Enteric contrast from recent modified barium swallow. Atherosclerosis. Disc levels: T12- L1: Unremarkable. L1-L2:  Unremarkable. L2-L3: Disc narrowing and mild bulging. Mild bilateral foraminal narrowing. L3-L4: Mild disc bulging and facet spurring. L4-L5: PLIF with solid arthrodesis and no visible impingement L5-S1:Mild disc bulging. Mild facet spurring. No neural compression. CT THORACIC MYELOGRAM FINDINGS: Alignment: Physiologic. Vertebrae: No evidence of fracture or bone lesion. Generalized osteopenia Cord: Normal appearing thickness and shape. No evidence of intrathecal mass, collection, or adhesion. Extra-spinal: Dependent atelectasis that is mild. No perispinal mass or inflammation is seen. Disc levels: Generalized thoracic disc narrowing with mild endplate spurring. Tiny disc protrusions seen at T5-6, T6-7, T7-8, and T8-9. No significant facet degeneration. No neural impingement. IMPRESSION: Cervical: 1. Normal appearance of the cord. 2. C4-C6 ACDF with solid arthrodesis. No progressive adjacent segment degeneration or neural impingement. Thoracic: No impingement or intrathecal abnormality. Lumbar: 1. No neural compression or visible abnormality of the conus/cauda equina. 2. L4-5 PLIF with solid arthrodesis. 3. Osteopenia. Electronically Signed   By: Monte Fantasia M.D.   On: 02/25/2020 10:27   DG MYELOGRAPHY LUMBAR INJ MULTI REGION  Result Date: 02/25/2020 CLINICAL DATA:  Generalized weakness/myelopathy FLUOROSCOPY TIME:  1.5 minutes.  4.4 mGy air kerma.  No exposures PROCEDURE: LUMBAR PUNCTURE FOR CERVICAL LUMBAR AND THORACIC MYELOGRAM CERVICAL AND LUMBAR AND THORACIC MYELOGRAM CT CERVICAL MYELOGRAM CT LUMBAR MYELOGRAM CT THORACIC MYELOGRAM Written and oral informed consent was obtained. Patient's Risperdal was held for 2 days and patient had a recent dose of her Xanax. Consent was obtained by Dr. Monte Fantasia. Patient was positioned prone on the fluoroscopy table. Local anesthesia was provided with 1% lidocaine without epinephrine after prepped and draped in the usual sterile fashion. Puncture was performed at  L4-5 using a 3 1/2 inch 22-gauge spinal needle via midline approach through a laminectomy defect. Using a single pass through the dura, the needle was placed within the thecal sac, with return of clear CSF. As requested, CSF was obtained and was clear. Approximately 11 cc was collected. 10 mL Omnipaque 300 was injected into the thecal sac, with normal opacification of the nerve roots and cauda equina consistent with free flow within the subarachnoid space. The patient was then moved to the trendelenburg position and contrast flowed into the Thoracic and Cervical spine regions. I personally performed the lumbar puncture and administered the intrathecal contrast. I also personally supervised acquisition of the myelogram images. TECHNIQUE: Contiguous axial images were obtained through the Cervical, Thoracic,  and Lumbar spine after the intrathecal infusion of infusion. Coronal and sagittal reconstructions were obtained of the axial image sets. FINDINGS: CERVICAL AND LUMBAR MYELOGRAM FINDINGS: The patient is too weak for typical myelographic series. Myelogram was used to document flow of contrast into the upper cervical canal. There is no block or limitation of flow seen throughout the lumbar, thoracic, or cervical spine. CT CERVICAL MYELOGRAM FINDINGS: Comparison is made with cervical myelography 10/07/2019 Alignment: Physiologic. Vertebrae: C4-C6 ACDF with solid arthrodesis. Unremarkable hardware. No evidence of fracture or bone lesion. Cord: Normal morphology.  No abnormal intrathecal filling defect. Extra-spinal: Negative. Disc levels: Mild generalized facet spurring. Mild disc narrowing and ridging at C3-4 and C6-7. No cord impingement or high-grade foraminal stenosis. CT LUMBAR MYELOGRAM FINDINGS: Segmentation: 5 lumbar type vertebrae. Alignment: Fused anterolisthesis at L4-5. Vertebrae: Generalized osteopenic appearance. No focal or erosive finding. No evidence of osteomyelitis. A presumed right iliac bone harvest  site is noted. Conus: Tip terminates at L1. The cauda equina appears smoothly contoured. Extra-spinal: Enteric contrast from recent modified barium swallow. Atherosclerosis. Disc levels: T12- L1: Unremarkable. L1-L2: Unremarkable. L2-L3: Disc narrowing and mild bulging. Mild bilateral foraminal narrowing. L3-L4: Mild disc bulging and facet spurring. L4-L5: PLIF with solid arthrodesis and no visible impingement L5-S1:Mild disc bulging. Mild facet spurring. No neural compression. CT THORACIC MYELOGRAM FINDINGS: Alignment: Physiologic. Vertebrae: No evidence of fracture or bone lesion. Generalized osteopenia Cord: Normal appearing thickness and shape. No evidence of intrathecal mass, collection, or adhesion. Extra-spinal: Dependent atelectasis that is mild. No perispinal mass or inflammation is seen. Disc levels: Generalized thoracic disc narrowing with mild endplate spurring. Tiny disc protrusions seen at T5-6, T6-7, T7-8, and T8-9. No significant facet degeneration. No neural impingement. IMPRESSION: Cervical: 1. Normal appearance of the cord. 2. C4-C6 ACDF with solid arthrodesis. No progressive adjacent segment degeneration or neural impingement. Thoracic: No impingement or intrathecal abnormality. Lumbar: 1. No neural compression or visible abnormality of the conus/cauda equina. 2. L4-5 PLIF with solid arthrodesis. 3. Osteopenia. Electronically Signed   By: Monte Fantasia M.D.   On: 02/25/2020 10:27    Microbiology: Recent Results (from the past 240 hour(s))  Culture, blood (routine x 2)     Status: None   Collection Time: 02/21/20  4:00 PM   Specimen: BLOOD LEFT ARM  Result Value Ref Range Status   Specimen Description BLOOD LEFT ARM  Final   Special Requests   Final    BOTTLES DRAWN AEROBIC AND ANAEROBIC Blood Culture results may not be optimal due to an inadequate volume of blood received in culture bottles   Culture   Final    NO GROWTH 5 DAYS Performed at [facility], [place] [address], [place], [place] [address]    Report Status 02/26/2020 FINAL  Final  Culture, blood (routine x 2)     Status: None   Collection Time: 02/21/20  4:04 PM   Specimen: BLOOD RIGHT ARM  Result Value Ref Range Status   Specimen Description BLOOD RIGHT ARM  Final   Special Requests   Final    BOTTLES DRAWN AEROBIC AND ANAEROBIC Blood Culture results may not be optimal due to an inadequate volume of blood received in culture bottles   Culture   Final    NO GROWTH 5 DAYS Performed at [facility], [place] [address], [place],  [address]    Report Status 02/26/2020 FINAL  Final  Resp Panel by RT-PCR (Flu A&B, Covid) Nasopharyngeal Swab  Status: None   Collection Time: 02/21/20  4:05 PM   Specimen: Nasopharyngeal Swab; Nasopharyngeal(NP) swabs in vial transport medium  Result Value Ref Range Status   SARS Coronavirus 2 by RT PCR NEGATIVE NEGATIVE Final    Comment: (NOTE) SARS-CoV-2 target nucleic acids are NOT DETECTED.  The SARS-CoV-2 RNA is generally detectable in upper respiratory specimens during the acute phase of infection. The lowest concentration of SARS-CoV-2 viral copies this assay can detect is 138 copies/mL. A negative result does not preclude SARS-Cov-2 infection and should not be used as the sole basis for treatment or other patient management decisions. A negative result may occur with  improper specimen collection/handling, submission of specimen other than nasopharyngeal swab, presence of viral mutation(s) within the areas targeted by this assay, and inadequate number of viral copies(<138 copies/mL). A negative result must be combined with clinical observations, patient history, and epidemiological information. The expected result is Negative.  Fact Sheet for Patients:  EntrepreneurPulse.com.au  Fact Sheet for Healthcare Providers:  IncredibleEmployment.be  This test is no t yet approved or cleared by the [place] FDA  and  has been authorized for detection and/or diagnosis of SARS-CoV-2 by FDA under an Emergency Use Authorization (EUA). This EUA will remain  in effect (meaning this test can be used) for the duration of the COVID-19 declaration under Section 564(b)(1) of the Act, 21 U.S.C.section 360bbb-3(b)(1), unless the authorization is terminated  or revoked sooner.       Influenza A by PCR NEGATIVE NEGATIVE Final   Influenza B by PCR NEGATIVE NEGATIVE Final    Comment: (NOTE) The Xpert Xpress SARS-CoV-2/FLU/RSV plus assay is intended as an aid in the diagnosis of influenza from Nasopharyngeal swab specimens and should not be used as a sole basis for treatment. Nasal washings and aspirates are unacceptable for Xpert Xpress SARS-CoV-2/FLU/RSV testing.  Fact Sheet for Patients: EntrepreneurPulse.com.au  Fact Sheet for Healthcare Providers: IncredibleEmployment.be  This test is not yet approved or cleared by the [place] FDA and has been authorized for detection and/or diagnosis of SARS-CoV-2 by FDA under an Emergency Use Authorization (EUA). This EUA will remain in effect (meaning this test can be used) for the duration of the COVID-19 declaration under Section 564(b)(1) of the Act, 21 U.S.C. section 360bbb-3(b)(1), unless the authorization is terminated or revoked.  Performed at [facility], [place] [address] [place], [place] [address]   MRSA PCR Screening     Status: None   Collection Time: 02/22/20  4:53 AM   Specimen: Nasal Mucosa; Nasopharyngeal  Result Value Ref Range Status   MRSA by PCR NEGATIVE NEGATIVE Final    Comment:        The GeneXpert MRSA Assay (FDA approved for NASAL specimens only), is one component of a comprehensive MRSA colonization surveillance program. It is not intended to diagnose MRSA infection nor to guide or monitor treatment for MRSA infections. Performed at [facility], [place] [address]  [place], [place] [address]   CSF culture     Status: None (Preliminary result)   Collection Time: 02/25/20  9:10 AM   Specimen: PATH Cytology CSF; Cerebrospinal Fluid  Result Value Ref Range Status   Specimen Description CSF  Final   Special Requests NONE  Final   Gram Stain   Final    WBC PRESENT, PREDOMINANTLY MONONUCLEAR NO ORGANISMS SEEN CYTOSPIN SMEAR    Culture   Final    NO GROWTH 1 DAY Performed at [facility], [address] [place], [place] [address]    Report Status PENDING  Incomplete     Labs: Basic Metabolic Panel: Recent Labs  Lab 02/21/20 1618 02/22/20 0748 02/23/20 0152 02/24/20 0334 02/25/20 0218  NA 141 142 140 139 139  K 3.5 3.6 3.4* 3.9 3.7  CL 108 112* 110 110 105  CO2 23 21* 20* 22 24  GLUCOSE 87 82 86 90 93  BUN 10 6* 6* 6* 7*  CREATININE 0.81 0.71 0.64 0.59 0.57  CALCIUM 9.1 8.6* 8.6* 8.6* 9.1  MG 2.0 1.7  --   --   --    Liver Function Tests: Recent Labs  Lab 02/21/20 1618 02/22/20 0748 02/23/20 0152 02/24/20 0334 02/25/20 0218  AST 12* 11* 9* 9* 11*  ALT 10 8 7 6 7   ALKPHOS 45 35* 39 38 40  BILITOT 1.3* 1.6* 1.3* 0.9 1.2  PROT 5.5* 4.7* 4.5* 4.5* 4.9*  ALBUMIN 3.5 2.8* 2.6* 2.6* 2.8*   No results for input(s): LIPASE, AMYLASE in the last 168 hours. No results for input(s): AMMONIA in the last 168 hours. CBC: Recent Labs  Lab 02/21/20 1618 02/22/20 0536 02/23/20 0152 02/24/20 0334 02/25/20 0218  WBC 7.9 7.0 5.8 6.4 6.9  NEUTROABS 5.1 4.4  --   --   --   HGB 13.0 12.4 11.3* 11.4* 12.2  HCT 42.4 37.7 35.7* 35.9* 36.2  MCV 91.6 89.8 89.3 88.4 86.2  PLT 235 193 196 188 200   Cardiac Enzymes: No results for input(s): CKTOTAL, CKMB, CKMBINDEX, TROPONINI in the last 168 hours. BNP: BNP (last 3 results) Recent Labs    12/18/19 1015  BNP 86.2    ProBNP (last 3 results) No results for input(s): PROBNP in the last 8760 hours.  CBG: No results for input(s): GLUCAP in the last 168 hours.     Signed:  Domenic Polite MD.  [facility] 02/26/2020, 2:41 PM

## 2020-02-26 NOTE — Progress Notes (Signed)
Awake and alert. No distress or complaints at this time.

## 2020-02-26 NOTE — Progress Notes (Signed)
Physical Therapy Treatment Patient Details Name: [name] MRN: [id number] DOB: Sep 12, 1955 Today's Date: 02/26/2020    History of Present Illness [name] is a 64 y.o. 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     PT Comments    Patient received in bed, agreeable to PT session. Flat affect. Reports no dizziness, BP low this am. Patient performed bed mobility with mod independence, transfers with mod independence. Ambulated 150 feet with RA dn min guard. No difficulties noted. She performed chair level exercises at end of session. Patient will continue to benefit from skilled PT while here to improve functional strength and independence.       Follow Up Recommendations  Home health PT     Equipment Recommendations  None recommended by PT    Recommendations for Other Services       Precautions / Restrictions Precautions Precaution Comments: mod fall Restrictions Weight Bearing Restrictions: No    Mobility  Bed Mobility Overal bed mobility: Modified Independent Bed Mobility: Supine to Sit     Supine to sit: Modified independent (Device/Increase time)        Transfers Overall transfer level: Modified independent Equipment used: Rolling walker (2 wheeled) Transfers: Sit to/from Stand Sit to Stand: Modified independent (Device/Increase time)            Ambulation/Gait Ambulation/Gait assistance: Min Gaffer (Feet): 150 Feet Assistive device: Rolling walker (2 wheeled) Gait Pattern/deviations: Step-through pattern;Decreased stride length;Narrow base of support Gait velocity: slightly reduced   General Gait Details: no lob, patient  reports no difficulties while walking.   Stairs             Wheelchair Mobility    Modified Rankin (Stroke Patients Only)       Balance Overall balance assessment: Modified Independent Sitting-balance support: Feet supported Sitting balance-Leahy Scale: Good     Standing balance support: Bilateral upper extremity supported;During functional activity Standing balance-Leahy Scale: Good Standing balance comment: good balance with RW                            Cognition Arousal/Alertness: Awake/alert Behavior During Therapy: Flat affect Overall Cognitive Status: Within Functional Limits for tasks assessed                                        Exercises Other Exercises Other Exercises: B LE strengthening exercises: ap, hip abd/add, LAQ, Marching, SLR x 10 reps    General Comments        Pertinent Vitals/Pain Pain Assessment: No/denies pain    Home Living                      Prior Function            PT Goals (current goals can now be found in the care plan section) Acute Rehab PT Goals Patient Stated Goal: to be safe at home PT Goal Formulation: With patient Time For Goal Achievement: 03/07/20 Potential to Achieve Goals: Good Progress towards PT goals: Progressing toward goals    Frequency    Min 3X/week  PT Plan Current plan remains appropriate    Co-evaluation              AM-PAC PT "6 Clicks" Mobility   Outcome Measure  Help needed turning from your back to your side while in a flat bed without using bedrails?: None Help needed moving from lying on your back to sitting on the side of a flat bed without using bedrails?: None Help needed moving to and from a bed to a chair (including a wheelchair)?: A Little Help needed standing up from a chair using your arms (e.g., wheelchair or bedside chair)?: None Help needed to walk in hospital room?: A Little Help needed climbing 3-5 steps with a  railing? : A Little 6 Click Score: 21    End of Session Equipment Utilized During Treatment: Gait belt Activity Tolerance: Patient tolerated treatment well Patient left: in chair;with chair alarm set;with call bell/phone within reach Nurse Communication: Mobility status PT Visit Diagnosis: Muscle weakness (generalized) (M62.81)     Time: 2330-0762 PT Time Calculation (min) (ACUTE ONLY): 15 min  Charges:  $Therapeutic Exercise: 8-22 mins                     Shemekia Patane, PT, GCS 02/26/20,11:22 AM

## 2020-02-27 DIAGNOSIS — R269 Unspecified abnormalities of gait and mobility: Secondary | ICD-10-CM | POA: Diagnosis not present

## 2020-02-27 DIAGNOSIS — J984 Other disorders of lung: Secondary | ICD-10-CM | POA: Diagnosis not present

## 2020-02-27 DIAGNOSIS — J189 Pneumonia, unspecified organism: Secondary | ICD-10-CM | POA: Diagnosis not present

## 2020-02-27 DIAGNOSIS — M2550 Pain in unspecified joint: Secondary | ICD-10-CM | POA: Diagnosis not present

## 2020-02-27 DIAGNOSIS — M6281 Muscle weakness (generalized): Secondary | ICD-10-CM | POA: Diagnosis not present

## 2020-02-27 DIAGNOSIS — R531 Weakness: Secondary | ICD-10-CM | POA: Diagnosis not present

## 2020-02-27 DIAGNOSIS — R29898 Other symptoms and signs involving the musculoskeletal system: Secondary | ICD-10-CM | POA: Diagnosis not present

## 2020-02-27 DIAGNOSIS — R208 Other disturbances of skin sensation: Secondary | ICD-10-CM | POA: Diagnosis not present

## 2020-02-27 DIAGNOSIS — R69 Illness, unspecified: Secondary | ICD-10-CM | POA: Diagnosis not present

## 2020-02-27 DIAGNOSIS — M609 Myositis, unspecified: Secondary | ICD-10-CM | POA: Diagnosis not present

## 2020-02-27 DIAGNOSIS — E440 Moderate protein-calorie malnutrition: Secondary | ICD-10-CM | POA: Diagnosis not present

## 2020-02-27 DIAGNOSIS — E538 Deficiency of other specified B group vitamins: Secondary | ICD-10-CM | POA: Diagnosis not present

## 2020-02-27 DIAGNOSIS — R2689 Other abnormalities of gait and mobility: Secondary | ICD-10-CM | POA: Diagnosis not present

## 2020-02-27 DIAGNOSIS — Z7401 Bed confinement status: Secondary | ICD-10-CM | POA: Diagnosis not present

## 2020-02-27 DIAGNOSIS — R5381 Other malaise: Secondary | ICD-10-CM | POA: Diagnosis not present

## 2020-02-27 DIAGNOSIS — Z9181 History of falling: Secondary | ICD-10-CM | POA: Diagnosis not present

## 2020-02-27 DIAGNOSIS — M5416 Radiculopathy, lumbar region: Secondary | ICD-10-CM | POA: Diagnosis not present

## 2020-02-27 DIAGNOSIS — D519 Vitamin B12 deficiency anemia, unspecified: Secondary | ICD-10-CM | POA: Diagnosis not present

## 2020-02-27 DIAGNOSIS — Z950 Presence of cardiac pacemaker: Secondary | ICD-10-CM | POA: Diagnosis not present

## 2020-02-27 NOTE — TOC Transition Note (Signed)
Transition of Care [facility]) - CM/SW Discharge Note   Patient Details  Name: MARRISA KIMBER MRN: [id number] Date of Birth: 10/17/1955  [facility] of [facility] CM/SW Contact:  Coralee Pesa, [place] Phone Number: 02/27/2020, 9:30 AM   Clinical Narrative:     Nurse to call report to [phone], ask for supervisor. They will provide room number.  Final next level of care: Skilled Nursing Facility Barriers to Discharge: SNF Pending transportation   Patient Goals and CMS Choice Patient states their goals for this hospitalization and ongoing recovery are:: to go to rehab before going home      Discharge Placement              Patient chooses bed at: [facility] Patient to be transferred to facility by: [facility] Name of family member notified: [name] Patient and family notified of of transfer: 02/27/20  Discharge Plan and Services In-house Referral: Clinical Social Work Discharge Planning Services: [employer] Consult Post Acute Care Choice: [facility]                               Social Determinants of Health (SDOH) Interventions     Readmission Risk Interventions No flowsheet data found.

## 2020-02-27 NOTE — Discharge Instructions (Signed)
Vitamin B12 Deficiency Vitamin B12 deficiency means that your body does not have enough vitamin B12. The body needs this vitamin:  To make red blood cells.  To make genes (DNA).  To help the nerves work. If you do not have enough vitamin B12 in your body, you can have health problems. What are the causes?  Not eating enough foods that contain vitamin B12.  Not being able to absorb vitamin B12 from the food that you eat.  Certain digestive system diseases.  A condition in which the body does not make enough of a certain protein, which results in too few red blood cells (pernicious anemia).  Having a surgery in which part of the stomach or small intestine is removed.  Taking medicines that make it hard for the body to absorb vitamin B12. These medicines include: ? Heartburn medicines. ? Some antibiotic medicines. ? Other medicines that are used to treat certain conditions. What increases the risk?  Being older than age 50.  Eating a vegetarian or vegan diet, especially while you are pregnant.  Eating a poor diet while you are pregnant.  Taking certain medicines.  Having alcoholism. What are the signs or symptoms? In some cases, there are no symptoms. If the condition leads to too few blood cells or nerve damage, symptoms can occur, such as:  Feeling weak.  Feeling tired (fatigued).  Not being hungry.  Weight loss.  A loss of feeling (numbness) or tingling in your hands and feet.  Redness and burning of the tongue.  Being mixed up (confused) or having memory problems.  Sadness (depression).  Problems with your senses. This can include color blindness, ringing in the ears, or loss of taste.  Watery poop (diarrhea) or trouble pooping (constipation).  Trouble walking. If anemia is very bad, symptoms can include:  Being short of breath.  Being dizzy.  Having a very fast heartbeat. How is this treated?  Changing the way you eat and drink, such  as: ? Eating more foods that contain vitamin B12. ? Drinking little or no alcohol.  Getting vitamin B12 shots.  Taking vitamin B12 supplements. Your doctor will tell you the dose that is best for you. Follow these instructions at home: Eating and drinking   Eat lots of healthy foods that contain vitamin B12. These include: ? Meats and poultry, such as beef, pork, chicken, turkey, and organ meats, such as liver. ? Seafood, such as clams, rainbow trout, salmon, tuna, and haddock. ? Eggs. ? Cereal and dairy products that have vitamin B12 added to them. Check the label. The items listed above may not be a complete list of what you can eat and drink. Contact a dietitian for more options. General instructions  Get any shots as told by your doctor.  Take supplements only as told by your doctor.  Do not drink alcohol if your doctor tells you not to. In some cases, you may only be asked to limit alcohol use.  Keep all follow-up visits as told by your doctor. This is important. Contact a doctor if:  Your symptoms come back. Get help right away if:  You have trouble breathing.  You have a very fast heartbeat.  You have chest pain.  You get dizzy.  You pass out. Summary  Vitamin B12 deficiency means that your body is not getting enough vitamin B12.  In some cases, there are no symptoms of this condition.  Treatment may include making a change in the way you eat and drink,   getting vitamin B12 shots, or taking supplements.  Eat lots of healthy foods that contain vitamin B12. This information is not intended to replace advice given to you by your health care provider. Make sure you discuss any questions you have with your health care provider. Document Revised: 11/12/2017 Document Reviewed: 11/12/2017 Elsevier Patient Education  2020 [employer]  

## 2020-02-27 NOTE — Progress Notes (Signed)
Report called to [place] at [facility].  Awaiting PTAR's arrival.

## 2020-02-27 NOTE — Progress Notes (Signed)
Removed PIV's and printed packet for receiving facility. Gathered patient belongings. Waiting on daughter to bring clothes for patient.

## 2020-02-28 LAB — CSF CULTURE W GRAM STAIN: Culture: NO GROWTH

## 2020-02-29 ENCOUNTER — Telehealth: Payer: Self-pay | Admitting: Neurology

## 2020-02-29 ENCOUNTER — Ambulatory Visit (HOSPITAL_COMMUNITY): Payer: Medicare HMO

## 2020-02-29 ENCOUNTER — Encounter (HOSPITAL_COMMUNITY): Payer: Medicare HMO

## 2020-02-29 ENCOUNTER — Other Ambulatory Visit: Payer: Self-pay | Admitting: *Deleted

## 2020-02-29 DIAGNOSIS — M6281 Muscle weakness (generalized): Secondary | ICD-10-CM

## 2020-02-29 DIAGNOSIS — R5381 Other malaise: Secondary | ICD-10-CM | POA: Diagnosis not present

## 2020-02-29 DIAGNOSIS — R69 Illness, unspecified: Secondary | ICD-10-CM | POA: Diagnosis not present

## 2020-02-29 DIAGNOSIS — R29898 Other symptoms and signs involving the musculoskeletal system: Secondary | ICD-10-CM

## 2020-02-29 DIAGNOSIS — R208 Other disturbances of skin sensation: Secondary | ICD-10-CM

## 2020-02-29 DIAGNOSIS — E440 Moderate protein-calorie malnutrition: Secondary | ICD-10-CM | POA: Diagnosis not present

## 2020-02-29 DIAGNOSIS — M5416 Radiculopathy, lumbar region: Secondary | ICD-10-CM

## 2020-02-29 DIAGNOSIS — R269 Unspecified abnormalities of gait and mobility: Secondary | ICD-10-CM | POA: Diagnosis not present

## 2020-02-29 DIAGNOSIS — E538 Deficiency of other specified B group vitamins: Secondary | ICD-10-CM | POA: Diagnosis not present

## 2020-02-29 DIAGNOSIS — J984 Other disorders of lung: Secondary | ICD-10-CM | POA: Diagnosis not present

## 2020-02-29 LAB — VITAMIN E
Vitamin E (Alpha Tocopherol): 6.3 mg/L — ABNORMAL LOW (ref 9.0–29.0)
Vitamin E(Gamma Tocopherol): 1 mg/L (ref 0.5–4.9)

## 2020-02-29 NOTE — Telephone Encounter (Signed)
Spoke with pt's daughter regarding getting a ncv/emg scheduled. I offered her Dr. Garth Bigness next available of 01/04, but she stated the hospital wanted her to have the test done within the next two weeks. Please advise at [phone].

## 2020-02-29 NOTE — Telephone Encounter (Signed)
Jillian, I moved some things around with Dr. Felecia Shelling for this Thursday to be able to work her in. Can you call daughter back and offer 8:30 (nicole)/10am (Dr. Lawana Chambers? Appt slots are on hold. Thank you!

## 2020-03-01 ENCOUNTER — Telehealth: Payer: Self-pay | Admitting: Neurology

## 2020-03-01 LAB — OLIGOCLONAL BANDS, CSF + SERM

## 2020-03-02 DIAGNOSIS — R69 Illness, unspecified: Secondary | ICD-10-CM | POA: Diagnosis not present

## 2020-03-03 ENCOUNTER — Encounter (INDEPENDENT_AMBULATORY_CARE_PROVIDER_SITE_OTHER): Payer: Medicare HMO | Admitting: Neurology

## 2020-03-03 ENCOUNTER — Ambulatory Visit (INDEPENDENT_AMBULATORY_CARE_PROVIDER_SITE_OTHER): Payer: Medicare HMO | Admitting: Neurology

## 2020-03-03 DIAGNOSIS — M5416 Radiculopathy, lumbar region: Secondary | ICD-10-CM | POA: Diagnosis not present

## 2020-03-03 DIAGNOSIS — R208 Other disturbances of skin sensation: Secondary | ICD-10-CM | POA: Diagnosis not present

## 2020-03-03 DIAGNOSIS — M6281 Muscle weakness (generalized): Secondary | ICD-10-CM | POA: Diagnosis not present

## 2020-03-03 DIAGNOSIS — M609 Myositis, unspecified: Secondary | ICD-10-CM

## 2020-03-03 DIAGNOSIS — R29898 Other symptoms and signs involving the musculoskeletal system: Secondary | ICD-10-CM | POA: Diagnosis not present

## 2020-03-03 DIAGNOSIS — Z0289 Encounter for other administrative examinations: Secondary | ICD-10-CM

## 2020-03-03 NOTE — Progress Notes (Signed)
Full Name: [name] Gender: Female MRN #: [id number] Date of Birth: 1955-10-09    Visit Date: 03/03/2020 08:29 Age: 64 Years Examining Physician: Arlice Colt, MD  Referring Physician: Arlice Colt, MD Patient History:  HAND TEMP: L-34.9C FOOT TEMP: R-34.9C, L-35.3C    History: [name] is a 64 year old woman who had pneumonia September 2021 followed by proximal muscle weakness, myalgias and then dysphagia. Symptoms have improved over the last 2 weeks.  Nerve conduction studies: The left ulnar and both tibial motor responses had normal distal latencies, amplitudes and conduction velocities. The right and left peroneal motor responses had reduced amplitude. The left peroneal motor response also had delayed distal latency and mildly reduced conduction velocity. The tibial F-wave latencies were minimally prolonged. Ulnar F-wave was normal.  Bilateral sural and superficial peroneal sensory responses had normal peak latencies and borderline normal amplitudes. The median and ulnar sensory responses had normal peak latencies and amplitudes. Galvanic sympathetics skin responses were absent in the foot and hand.  Repetitive stimulation of the ulnar nerve did not cause a decremental response before or after exercise.  Electromyography: Needle EMG of selected muscles of the left arm and leg was performed. In the arm, there were some small polyphasic motor units in the deltoid and biceps and APB muscles though recruitment was fairly normal. Other muscles in the arm were normal. In the left leg, there were small polyphasic motor units in the iliopsoas, gluteus medius, gastrocnemius and tibialis anterior though recruitment was very normal. The abductor hallucis muscle and the foot was normal. Complex repetitive discharges were noted in the gluteus medius muscle and some fibs and positive sharp waves were seen in the gastrocnemius muscle. No spontaneous activity was seen in the other  muscles tested.  Impression: This NCV/EMG study shows the following: 1. Most of the proximal muscles showed small polyphasic motor units that recruited normally. This could be consistent with a mild myopathy. 2. Although the peroneal motor responses had reduced amplitudes, muscles innervated by that nerve did not show a neuropathic pattern. Other motor and sensory nerves were essentially normal and this is likely to be an incidental finding rather than an indicator of neuropathy 3.   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 Felecia Shelling, MD, PhD, FAAN Certified in Neurology, Clinical Neurophysiology, Sleep Medicine, Pain Medicine and Neuroimaging Director, [employer] at [facility] [address] [place], [place] [address] [phone]     Verbal informed consent was obtained from the patient, patient was informed of potential risk of procedure, including bruising, bleeding, hematoma formation, infection, muscle weakness, muscle pain, numbness, among others.         [place]    Nerve / Sites Muscle Latency Ref. Amplitude Ref. Rel Amp Segments Distance Velocity Ref. Area    ms ms mV mV %  cm m/s m/s mVms  L Ulnar - ADM     Wrist ADM 3.3 ?3.3 8.8 ?6.0 100 Wrist - ADM 7   34.9     B.Elbow ADM 7.1  8.3  95.2 B.Elbow - Wrist 20 53 ?49 33.1     A.Elbow ADM 9.1  8.1  97.7 A.Elbow - B.Elbow 10 51 ?49 32.5         A.Elbow - Wrist      R Peroneal - EDB     Ankle EDB 5.8 ?6.5 1.4 ?2.0 100 Ankle - EDB 9  9.7     Fib head EDB 12.0  1.3  94 Fib head - Ankle 27 44 ?44 9.7     Pop fossa EDB 14.3  1.3  97.6 Pop fossa - Fib head 10 44 ?44 9.8         Pop fossa - Ankle      L Peroneal - EDB     Ankle EDB 8.0 ?6.5 0.7 ?2.0 100 Ankle - EDB 9   5.1     Fib head EDB 14.7  0.6  88.8 Fib head - Ankle 26 39 ?44 5.4     Pop fossa EDB 17.6  0.5  87.1 Pop fossa - Fib head 10 35 ?44 5.4          Pop fossa - Ankle      R Tibial - AH     Ankle AH 4.2 ?5.8 12.1 ?4.0 100 Ankle - AH 9   26.7     Pop fossa AH 13.1  8.4  69.3 Pop fossa - Ankle 39 44 ?41 23.4  L Tibial - AH     Ankle AH 4.4 ?5.8 16.1 ?4.0 100 Ankle - AH 9   38.2     Pop fossa AH 13.5  12.3  76.1 Pop fossa - Ankle 39 43 ?41 34.5                    SSR    Nerve / Sites Latency   s  L Sympathetic - Palm     Palm NR  R Sympathetic - Foot     Foot NR  L Sympathetic - Foot     Foot NR            SNC    Nerve / Sites Rec. Site Peak Lat Ref.  Amp Ref. Segments Distance    ms ms V V  cm  R Sural - Ankle (Calf)     Calf Ankle 3.5 ?4.4 6 ?6 Calf - Ankle 14  L Sural - Ankle (Calf)     Calf Ankle 4.2 ?4.4 6 ?6 Calf - Ankle 14  R Superficial peroneal - Ankle     Lat leg Ankle 4.4 ?4.4 5 ?6 Lat leg - Ankle 14  L Superficial peroneal - Ankle     Lat leg Ankle 3.8 ?4.4 6 ?6 Lat leg - Ankle 14  L Median - Orthodromic (Dig II, Mid palm)     Dig II Wrist 3.2 ?3.4 18 ?10 Dig II - Wrist 13  L Ulnar - Orthodromic, (Dig V, Mid palm)     Dig V Wrist 3.0 ?3.1 6 ?5 Dig V - Wrist 25                 F  Wave    Nerve F Lat Ref.   ms ms  R Tibial - AH 59.6 ?56.0  L Tibial - AH 57.2 ?56.0  L Ulnar - ADM 30.1 ?32.0           Rep Stim    Anatomy / Train Rate Ampl. Ampl 4-1 Fac Ampl Area Area 4-1 Fac Area Time   Hz mV % % mVms % %   L Abductor digiti minimi (manus) - (Ulnar)  Baseline @1Hz  1 9.0 0.8 100 32.9 2.2 100 0:00:00  Baseline @3Hz  3 9.0 2.3 99.7 32.2 6.1 98 0:00:32  Post Exercise @0 :00 3 9.7 6 107 33.2 7.8 101 0:02:22  @ 0:30 3 9.4 2.4 104 32.4  5.8 98.5 0:02:55  @ 1:00 3 9.3 2.3 103 31.8 5.2 96.8 0:03:27  @ 2:00 3 9.3 2.9 103 31.8 4.6 96.9 0:04:30  @ 3:00 3 9.3 2.1 103 31.6 4.3 96.3 0:05:32  @ 4:00 3 9.3 3.1 103 31.7 4.8 96.6 0:06:35  @ 5:00 3 9.3 1.8 103 31.6 4.6 96.1 0:07:37       EMG Summary Table    Spontaneous MUAP Recruitment  Muscle IA Fib PSW Fasc Other Amp Dur. Poly Pattern  L. Deltoid Normal  None None None _______ Decreased Normal 1+ Normal  L. Triceps brachii Normal None None None _______ Normal Normal Normal Normal  L. Biceps brachii Normal None None None _______ Normal Normal 1+ Normal  L. Extensor digitorum communis Normal None None None _______ Normal Normal Normal Normal  L. Flexor carpi ulnaris Normal None None None _______ Normal Normal Normal Normal  L. First dorsal interosseous Normal None None None _______ Normal Normal Normal Normal  L. Abductor pollicis brevis Normal None None None _______ Decreased Normal 1+ Normal  L. Vastus medialis Normal None None None _______ Normal Normal 1+ Normal  L. Iliopsoas Normal None None None _______ Normal Increased 1+ Reduced  L. Peroneus longus Normal None None None _______ Normal Normal Normal Normal  L. Tibialis anterior Normal None None None _______ Normal Decreased 1+ Normal  L. Gastrocnemius (Medial head) Normal 1+ 1+ None _______ Normal Decreased 1+ Normal  L. Abductor hallucis Normal None None None _______ Normal Normal Normal Normal  L. Gluteus medius Normal None None None CRDs Normal Increased 1+ Normal                                 [facility]  PATIENT: [name] DOB: 1955/08/11  REFERRING DOCTOR OR PCP: Dr. Lujean Amel SOURCE: Patient, notes from Dr. Rolena Infante, imaging reports and lumbar MRI and CT images personally reviewed.   _________________________________   HISTORICAL  CHIEF COMPLAINT:  No chief complaint on file.   HISTORY OF PRESENT ILLNESS:  Rada Zegers is a 64 y.o. woman with weakness, myalgia, dysphagia and poor gait  03/03/2020: Since the last visit, she has been hospitalized due to swallowing difficulties. She was discharged to skilled nursing facility and is getting physical therapy. She has done better and feels much stronger this week than last week. She is now able to walk without a walker. She is able to go much further with a walker. She still feels weak  approximately more than distally. Dysphagia has improved.   Symptoms started after she had pneumonia in late September 2021. While in the hospital creatinine kinase was elevated at 836 but was normal when I checked in November.  At baseline ,prior to her hospitalization, she was independent but her family helps her with some tasks.   She would walk to a gas station next to her apartment and do one flight of stairs.   Due to back pain, she has been less active but even when pain improved she did not do more activity.   4, she has generally done less for herself this year than last year.  Labs also showed low B12 (146 with 180-914 normal).  CK was elevated at 836 (<234 is normal) while hospitalized but was normal when we checked at the last visit.    TSH, ammonia, lactic acid was normal.   CSF 02/25/2020 showed mildly elevated protein (57). Vitamin D was reduced to  6.3.  Labs 02/08/2020 showed no acetylcholine receptor binding or modulating proteins. TSH was normal.  Imaging  CT myelogram 02/25/2020: Lumbar spine showed L4-L5 PLIF with solid arthrodesis. She had osteopenia. There is no neural compression or spinal stenosis. Thoracic spine was normal. Cervical spine showed C4-C6 ACDF with solid arthrodesis and no progressive adjacent segment degeneration. No nerve root compression or spinal stenosis.    CT angiogram of the head and neck 12/15/2019 was normal.  CT scan of the head 05/26/2019: There is mild cortical atrophy, most pronounced in the parietal lobes.  Brain parenchyma appeared normal.  There were no acute findings.  CT scan lumbar spine 01/06/2019 showed prior fusion and posterior decompression at the L4-5 level with incorporation within the intervertebral disc spacer, however minimal incorporation across the remaining endplates at this level. Hardware is intact without evidence of loosening.   Probable mild canal stenosis at the L3-4 level.  L4-5 far lateral disc material appears to contact  the exited left L4 nerve root. . Central disc protrusion at L5-S1 without evidence of canal stenosis.  MRI of the lumbar spine 09/11/2005 showed mild spinal stenosis at L4-L5 due to disc bulging, facet hypertrophy and ligamenta flava hypertrophy.  There was lateral recess stenosis.  L5-S1 showed facet hypertrophy but no spinal stenosis or foraminal narrowing.  Other levels appeared normal.  03/03/2020 NCV/EMG study shows the following: 1. Most of the proximal muscles showed small polyphasic motor units that recruited normally. This could be consistent with a mild myopathy. 2. Although the peroneal motor responses had reduced amplitudes, muscles innervated by that nerve did not show a neuropathic pattern. Other motor and sensory nerves were essentially normal and this is likely to be an incidental finding rather than an indicator of neuropathy 3.   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   No wheezes GastrointestinaI: No nausea, vomiting, diarrhea, abdominal pain, fecal incontinence Genitourinary: No dysuria, urinary retention or frequency.  No nocturia. Musculoskeletal:As above  integumentary: No rash, pruritus, skin lesions Neurological: as above Psychiatric: No depression at this time.  No anxiety Endocrine: No palpitations, diaphoresis, change in appetite, change in weigh or increased thirst Hematologic/Lymphatic: No anemia, purpura, petechiae. Allergic/Immunologic: No itchy/runny eyes, nasal congestion, recent allergic reactions, rashes  ALLERGIES: Allergies  Allergen Reactions  . Penicillins Other (See Comments)    Yeast  infection Did it involve swelling of the face/tongue/throat, SOB, or low BP? No Did it involve sudden or severe rash/hives, skin peeling, or any reaction on the inside of your mouth or nose? No Did you need to seek medical attention at a hospital or doctor's office? Yes When did it last happen? More than 5 years ago If all above answers are "NO", may proceed with cephalosporin use.     HOME MEDICATIONS:  Current Outpatient Medications:  .  acetaminophen (TYLENOL) 325 MG tablet, Take 2 tablets (650 mg total) by mouth every 6 (six) hours as needed for mild pain (or Fever >/= 101)., Disp: , Rfl:  .  ALPRAZolam (XANAX) 1 MG tablet, 1 po bid prn and occas extra 1 po mid-day. (Patient taking differently: Take 2 mg by mouth at bedtime. ), Disp: 75 tablet, Rfl: 1 .  cyclobenzaprine (FLEXERIL) 10 MG tablet, Take 10 mg by mouth at bedtime. , Disp: , Rfl:  .  risperiDONE (RISPERDAL) 3 MG tablet, Take 1 tablet (3 mg total) by mouth at bedtime., Disp: 90 tablet, Rfl: 1 .  vitamin B-12 1000 MCG tablet, Take 1 tablet (1,000 mcg total) by mouth daily., Disp: , Rfl:   PAST MEDICAL HISTORY: Past Medical History:  Diagnosis Date  . Anxiety    anxiety and mild depressive order systoms  . Bipolar disorder ([place])     (02/20/2013)  . Cervical cancer ([place]) 03/19/1980  . Depression   . Dysrhythmia   . YKZLDJTT(017.7)    "monthly" (02/20/2013)  . Pacemaker    medtronic  . PONV (postoperative nausea and vomiting)   . Sinus pause 02/14/2013  . Syncope and collapse    echo-April 12,2012-EF 55% Echo normal; recorder with prolonged sinus pauses --> status post  . Tobacco abuse   . Vertigo     PAST SURGICAL HISTORY: Past Surgical History:  Procedure Laterality Date  . ABDOMINAL HYSTERECTOMY  03/19/1980   "partial"  . BACK SURGERY    . CERVICAL FUSION Left 11/2013   C4-C6  . CERVICAL FUSION     3,4,5,  . INSERT / REPLACE / REMOVE PACEMAKER  02/20/2013   medtronic  . LOOP RECORDER IMPLANT N/A 11/06/2012    Procedure: LINQ LOOP RECORDER IMPLANT;  Surgeon: Sanda Klein, MD;  Location: [place] CATH LAB;  Service: Cardiovascular;  Laterality: N/A;  . NECK SURGERY  11/17/2013  . PACEMAKER PLACEMENT N/A 02/16/2013  . PERMANENT PACEMAKER INSERTION N/A 02/20/2013   Procedure: PERMANENT PACEMAKER INSERTION;  Surgeon: Sanda Klein, MD;  Location: [place] CATH LAB;  Service: Cardiovascular;  Laterality: N/A;  . POSTERIOR LUMBAR FUSION  ~ 2009  . ROBOTIC ASSISTED SALPINGO OOPHERECTOMY Bilateral 11/25/2018   Procedure: XI ROBOTIC ASSISTED BILATERAL SALPINGO OOPHORECTOMY and LYSIS OF ADHESIONS.;  Surgeon: Everitt Amber, MD;  Location: [facility];  Service: Gynecology;  Laterality: Bilateral;  . SPINAL FUSION  03/19/2014  . TONSILLECTOMY    . TRANSTHORACIC ECHOCARDIOGRAM  07/11/2010   The left atrial size is normal.There is no evidence of mitral vavle prolaspe.Right ventriicular systolic pressure is normal . Injection of contrast documented no interatrial shunt. Essentially normal 2D echo -doppler study     FAMILY HISTORY: Family History  Adopted: Yes  Problem Relation Age of Onset  . Heart attack Brother       PHYSICAL EXAM  There were no vitals filed for this visit.  There is no height or weight on file to calculate BMI.   General: The patient is well-developed and well-nourished and in no acute distress  HEENT:  Head is [place]/AT.  Sclera are anicteric.  Neck:The neck is nontender with good range of motion   Skin: Extremities are without rash or  edema.   Neurologic Exam  Mental status: Alert and oriented.  Reduced focus.   Cranial nerves: Extraocular movements are full. There is good facial sensation to soft touch bilaterally.Facial strength is normal. No obvious hearing deficits are noted.  Motor:  Muscle bulk is normal. Muscles are no longer tender tone is normal. Strength is 4/5 in the iliopsoas muscles, 4+/5 elsewhere in the legs.     Sensory: Sensory testing showed reduced touch sensation in  the right leg (anterolateral lower thigh to shin.   Feet and medial aspect of legs had symmetric sensation.  She has reduced vibration sensation in toes relative to ankles/knees.  Ankles were just minimally reduced compared  to knees.  Coordination: Cerebellar testing reveals good finger-nose-finger and heel-to-shin bilaterally.  Gait and station: Station is norma. She is able to stand up without using her arms. She can take steps without support but does better with a walker. Romberg is negative.   Reflexes: Deep tendon reflexes are symmetric and normal bilaterally.       DIAGNOSTIC DATA (LABS, IMAGING, TESTING) - I reviewed patient records, labs, notes, testing and imaging myself where available.  Lab Results  Component Value Date   WBC 6.9 02/25/2020   HGB 12.2 02/25/2020   HCT 36.2 02/25/2020   MCV 86.2 02/25/2020   PLT 200 02/25/2020      Component Value Date/Time   NA 139 02/25/2020 0218   NA 139 01/04/2020 1147   K 3.7 02/25/2020 0218   CL 105 02/25/2020 0218   CO2 24 02/25/2020 0218   GLUCOSE 93 02/25/2020 0218   BUN 7 (L) 02/25/2020 0218   BUN 11 01/04/2020 1147   CREATININE 0.57 02/25/2020 0218   CREATININE 1.08 (H) 09/24/2018 1019   CALCIUM 9.1 02/25/2020 0218   PROT 4.9 (L) 02/25/2020 0218   PROT 6.6 01/04/2020 1147   ALBUMIN 3.4 (L) 02/25/2020 0910   AST 11 (L) 02/25/2020 0218   ALT 7 02/25/2020 0218   ALKPHOS 40 02/25/2020 0218   BILITOT 1.2 02/25/2020 0218   BILITOT 1.0 01/04/2020 1147   GFRNONAA >60 02/25/2020 0218   GFRNONAA 56 (L) 09/24/2018 1019   GFRAA 69 01/04/2020 1147   GFRAA 65 09/24/2018 1019   Lab Results  Component Value Date   CHOL 256 (H) 09/24/2018   HDL 50 09/24/2018   LDLCALC 179 (H) 09/24/2018   TRIG 130 09/24/2018   CHOLHDL 5.1 (H) 09/24/2018       ASSESSMENT AND PLAN  Proximal muscle weakness - Plan: NCV with EMG(electromyography), Parvovirus B19 Antibody, IGG and IGM, CK, Aldolase  Weakness of both lower extremities -  Plan: NCV with EMG(electromyography)  Lumbar radiculopathy - Plan: NCV with EMG(electromyography)  Dysesthesia - Plan: NCV with EMG(electromyography)  Myositis, unspecified myositis type, unspecified site - Plan: Parvovirus B19 Antibody, IGG and IGM, CK, Aldolase  1.  She has proximal weakness that has improved over the past couple weeks.  She also has improvement in her dysphagia.  Myalgia is greatly improved. EMG was consistent with a mild myopathy. She may have had a viral myositis or postinfectious myositis. I will check labs for parvovirus IgM, creatinine kinase and aldolase. 2. She is doing better and will continue to get physical therapy. . 3.   Stay active and exercise as tolerated. 4.   She will return to see [place] in 3 months or sooner if there are new or worsening neurologic symptoms.    [place] Kestner A. Felecia Shelling, MD, [facility] 32/02/2481, 50:03 AM Certified in Neurology, Clinical Neurophysiology, Sleep Medicine and Neuroimaging  [facility] [address], [place], [place] [address] [phone]

## 2020-03-04 ENCOUNTER — Telehealth: Payer: Self-pay | Admitting: Physician Assistant

## 2020-03-04 LAB — CK: Total CK: 38 U/L (ref 32–182)

## 2020-03-04 LAB — PARVOVIRUS B19 ANTIBODY, IGG AND IGM
Parvovirus B19 IgG: 0.5 {index} (ref 0.0–0.8)
Parvovirus B19 IgM: 0.1 {index} (ref 0.0–0.8)

## 2020-03-04 LAB — ALDOLASE: Aldolase: 5.7 U/L (ref 3.3–10.3)

## 2020-03-04 NOTE — Telephone Encounter (Signed)
I called patient.  She has Xanax so no Valium.  Advised her to take Xanax for anxiety for the flight.

## 2020-03-04 NOTE — Telephone Encounter (Signed)
Patient will be taking a trip on a plane on 03/09/20. She is requesting an RX for Valium for the trip. [id number], [phone].

## 2020-03-07 DIAGNOSIS — M6281 Muscle weakness (generalized): Secondary | ICD-10-CM | POA: Diagnosis not present

## 2020-03-07 DIAGNOSIS — J189 Pneumonia, unspecified organism: Secondary | ICD-10-CM | POA: Diagnosis not present

## 2020-03-08 DIAGNOSIS — J189 Pneumonia, unspecified organism: Secondary | ICD-10-CM | POA: Diagnosis not present

## 2020-03-08 DIAGNOSIS — M6281 Muscle weakness (generalized): Secondary | ICD-10-CM | POA: Diagnosis not present

## 2020-03-09 DIAGNOSIS — M6281 Muscle weakness (generalized): Secondary | ICD-10-CM | POA: Diagnosis not present

## 2020-03-09 DIAGNOSIS — J189 Pneumonia, unspecified organism: Secondary | ICD-10-CM | POA: Diagnosis not present

## 2020-03-10 DIAGNOSIS — J189 Pneumonia, unspecified organism: Secondary | ICD-10-CM | POA: Diagnosis not present

## 2020-03-10 DIAGNOSIS — M6281 Muscle weakness (generalized): Secondary | ICD-10-CM | POA: Diagnosis not present

## 2020-03-12 DIAGNOSIS — J189 Pneumonia, unspecified organism: Secondary | ICD-10-CM | POA: Diagnosis not present

## 2020-03-12 DIAGNOSIS — M6281 Muscle weakness (generalized): Secondary | ICD-10-CM | POA: Diagnosis not present

## 2020-03-14 ENCOUNTER — Encounter: Payer: Medicare HMO | Admitting: Cardiovascular Disease

## 2020-03-14 DIAGNOSIS — R195 Other fecal abnormalities: Secondary | ICD-10-CM | POA: Diagnosis not present

## 2020-03-14 DIAGNOSIS — E440 Moderate protein-calorie malnutrition: Secondary | ICD-10-CM | POA: Diagnosis not present

## 2020-03-15 DIAGNOSIS — M6281 Muscle weakness (generalized): Secondary | ICD-10-CM | POA: Diagnosis not present

## 2020-03-15 DIAGNOSIS — J189 Pneumonia, unspecified organism: Secondary | ICD-10-CM | POA: Diagnosis not present

## 2020-03-16 DIAGNOSIS — J189 Pneumonia, unspecified organism: Secondary | ICD-10-CM | POA: Diagnosis not present

## 2020-03-16 DIAGNOSIS — R55 Syncope and collapse: Secondary | ICD-10-CM | POA: Diagnosis not present

## 2020-03-16 DIAGNOSIS — E538 Deficiency of other specified B group vitamins: Secondary | ICD-10-CM | POA: Diagnosis not present

## 2020-03-16 DIAGNOSIS — E440 Moderate protein-calorie malnutrition: Secondary | ICD-10-CM | POA: Diagnosis not present

## 2020-03-16 DIAGNOSIS — R2689 Other abnormalities of gait and mobility: Secondary | ICD-10-CM | POA: Diagnosis not present

## 2020-03-16 DIAGNOSIS — M6281 Muscle weakness (generalized): Secondary | ICD-10-CM | POA: Diagnosis not present

## 2020-03-16 DIAGNOSIS — J984 Other disorders of lung: Secondary | ICD-10-CM | POA: Diagnosis not present

## 2020-03-16 DIAGNOSIS — R69 Illness, unspecified: Secondary | ICD-10-CM | POA: Diagnosis not present

## 2020-03-17 ENCOUNTER — Encounter: Payer: Self-pay | Admitting: Physician Assistant

## 2020-03-17 ENCOUNTER — Telehealth (INDEPENDENT_AMBULATORY_CARE_PROVIDER_SITE_OTHER): Payer: Medicare HMO | Admitting: Physician Assistant

## 2020-03-17 DIAGNOSIS — F39 Unspecified mood [affective] disorder: Secondary | ICD-10-CM | POA: Diagnosis not present

## 2020-03-17 DIAGNOSIS — F411 Generalized anxiety disorder: Secondary | ICD-10-CM | POA: Diagnosis not present

## 2020-03-17 DIAGNOSIS — R69 Illness, unspecified: Secondary | ICD-10-CM | POA: Diagnosis not present

## 2020-03-17 DIAGNOSIS — F17200 Nicotine dependence, unspecified, uncomplicated: Secondary | ICD-10-CM | POA: Diagnosis not present

## 2020-03-17 MED ORDER — ALPRAZOLAM 1 MG PO TABS: ORAL_TABLET | ORAL | 2 refills | Status: DC

## 2020-03-17 NOTE — Progress Notes (Signed)
[facility] Check  Patient ID: [name],  MRN: [id number]  PCP: Darrow BussingKoirala, Dibas, MD  Date of Evaluation: 03/17/2020  time spent:30 minutes  Chief Complaint:  Chief Complaint    Anxiety; Depression     Virtual Visit via Telehealth  I connected with patient by a video enabled telemedicine application with their informed consent, and verified patient privacy and that I am speaking with the correct person using two identifiers.  I am private, in my office and the patient is at home.  I discussed the limitations, risks, security and privacy concerns of performing an evaluation and management service by video and the availability of in person appointments. I also discussed with the patient that there may be a patient responsible charge related to this service. The patient expressed understanding and agreed to proceed.   I discussed the assessment and treatment plan with the patient. The patient was provided an opportunity to ask questions and all were answered. The patient agreed with the plan and demonstrated an understanding of the instructions.   The patient was advised to call back or seek an in-person evaluation if the symptoms worsen or if the condition fails to improve as anticipated.  I provided 30 minutes of non-face-to-face time during this encounter.   HISTORY/CURRENT STATUS: HPI for 6-week med check.  Since our last visit, she was admitted to the hospital for community-acquired pneumonia.  See ER note from 02/21/2020.  She was in a subacute nursing facility for a couple of weeks and was discharged to her home yesterday.  Her daughter is staying with her.  Patient states she is getting a little bit stronger.  It is really difficult to tell where she stands with her mental health.  She is not able to enjoy anything because she is not strong enough.  She is glad to be back home though.  States that Christmas was depressing but since she is home now she is happier.  She  is not crying easily.  Not having nightmares, is sleeping well.  No suicidal or homicidal thoughts.  She continues to have a lot of anxiety which is worse in the evening.  She takes the Xanax usually only in the evening and it is helpful.  She needs to take 2 at a time.  On really bad days, she takes a third one during the day.  Denies dizziness or falls.  No manic symptoms of increased energy, increased talkativeness, grandiosity, increased spending, risky or impulsive behaviors,  hallucinations are reported  Individual Medical History/ Review of Systems: Changes? :Yes   See HPI and records on chart.   Past medications for mental health diagnoses include: Trazodone, Belsomra, Risperdal, Zoloft, hydroxyzine, mirtazapine, Xanax, Valium, Wellbutrin, Abilify, gabapentin, Strattera, Vyvanse, Ambien, Lunesta, Dayvigo didn't help, Latuda  Allergies: Penicillins  Current Medications:  Current Outpatient Medications:  .  acetaminophen (TYLENOL) 325 MG tablet, Take 2 tablets (650 mg total) by mouth every 6 (six) hours as needed for mild pain (or Fever >/= 101)., Disp: , Rfl:  .  cyclobenzaprine (FLEXERIL) 10 MG tablet, Take 10 mg by mouth at bedtime. , Disp: , Rfl:  .  risperiDONE (RISPERDAL) 3 MG tablet, Take 1 tablet (3 mg total) by mouth at bedtime., Disp: 90 tablet, Rfl: 1 .  vitamin B-12 1000 MCG tablet, Take 1 tablet (1,000 mcg total) by mouth daily., Disp: , Rfl:  .  ALPRAZolam (XANAX) 1 MG tablet, 1 po bid prn and occas extra 1 po mid-day., Disp: 75 tablet,  Rfl: 2 Medication Side Effects: none  Family Medical/ Social History: Changes? No  MENTAL HEALTH EXAM:  There were no vitals taken for this visit.There is no height or weight on file to calculate BMI.  General Appearance: Casual  Eye Contact:  Good  Speech:  Clear and Coherent and Slow  Volume:  Normal  Mood:  Euthymic  Affect:  Flat  Thought Process:  Goal Directed and Descriptions of Associations: Intact  Orientation:  Full  (Time, Place, and Person)  Thought Content: Logical   Suicidal Thoughts:  No  Homicidal Thoughts:  No  Memory:  WNL  Judgement:  Good  Insight:  Fair  Psychomotor Activity:  Difficult to tell from the head up on the video.  Concentration:  Concentration: Good and Attention Span: Good  Recall:  Good  Fund of Knowledge: Good  Language: Good  Assets:  Desire for Improvement  ADL's:  Intact  Cognition: WNL  Prognosis:  Poor   Most recent pertinent labs: 02/25/2020 CMP glucose was 90 AST was 11, total protein 4.9 Refer to her chart for other extensive lab results.  DIAGNOSES:    ICD-10-CM   1. Episodic mood disorder (HCC)  F39   2. Generalized anxiety disorder  F41.1   3. Smoker  F17.200     Receiving Psychotherapy: No    RECOMMENDATIONS:  PDMP was reviewed. I provided 30 minutes of nonface-to-face time during this encounter, including a brief review of hospital notes and most recent labs. As far as her mental health goes, she is stable.  I wish that Xanax was not needed given her poor physical health and risk for falls, but she has been on a benzo for years and at this point I think it will be extremely difficult to wean her off.  She understands the fall risk and accepts. Smoking cessation. Continue Risperdal 3 mg, 1 p.o. nightly. Continue Xanax 1 mg to 1 p.o. twice daily and one half p.o. during midday as needed anxiety, no more than 2.5 pills/day. Return in 3 months.  Melony Overly, PA-C

## 2020-03-22 ENCOUNTER — Emergency Department (HOSPITAL_COMMUNITY)
Admission: EM | Admit: 2020-03-22 | Discharge: 2020-03-25 | Disposition: A | Discharge status: Home / Self Care | Payer: Medicare HMO | Attending: Emergency Medicine | Admitting: Emergency Medicine

## 2020-03-22 ENCOUNTER — Emergency Department (HOSPITAL_COMMUNITY): Payer: Medicare HMO

## 2020-03-22 ENCOUNTER — Other Ambulatory Visit: Payer: Self-pay

## 2020-03-22 DIAGNOSIS — R531 Weakness: Secondary | ICD-10-CM | POA: Diagnosis not present

## 2020-03-22 DIAGNOSIS — Z950 Presence of cardiac pacemaker: Secondary | ICD-10-CM | POA: Diagnosis not present

## 2020-03-22 DIAGNOSIS — W19XXXA Unspecified fall, initial encounter: Secondary | ICD-10-CM | POA: Insufficient documentation

## 2020-03-22 DIAGNOSIS — Z043 Encounter for examination and observation following other accident: Secondary | ICD-10-CM | POA: Diagnosis not present

## 2020-03-22 DIAGNOSIS — S0990XA Unspecified injury of head, initial encounter: Secondary | ICD-10-CM | POA: Insufficient documentation

## 2020-03-22 DIAGNOSIS — Z20822 Contact with and (suspected) exposure to covid-19: Secondary | ICD-10-CM | POA: Diagnosis not present

## 2020-03-22 DIAGNOSIS — R000 Tachycardia, unspecified: Secondary | ICD-10-CM | POA: Diagnosis not present

## 2020-03-22 DIAGNOSIS — R296 Repeated falls: Secondary | ICD-10-CM | POA: Diagnosis not present

## 2020-03-22 DIAGNOSIS — M6281 Muscle weakness (generalized): Secondary | ICD-10-CM | POA: Insufficient documentation

## 2020-03-22 DIAGNOSIS — F17210 Nicotine dependence, cigarettes, uncomplicated: Secondary | ICD-10-CM | POA: Insufficient documentation

## 2020-03-22 DIAGNOSIS — Z79899 Other long term (current) drug therapy: Secondary | ICD-10-CM | POA: Insufficient documentation

## 2020-03-22 DIAGNOSIS — S0993XA Unspecified injury of face, initial encounter: Secondary | ICD-10-CM | POA: Diagnosis not present

## 2020-03-22 DIAGNOSIS — R52 Pain, unspecified: Secondary | ICD-10-CM | POA: Diagnosis not present

## 2020-03-22 DIAGNOSIS — M25562 Pain in left knee: Secondary | ICD-10-CM | POA: Diagnosis not present

## 2020-03-22 DIAGNOSIS — R609 Edema, unspecified: Secondary | ICD-10-CM | POA: Diagnosis not present

## 2020-03-22 DIAGNOSIS — R9431 Abnormal electrocardiogram [ECG] [EKG]: Secondary | ICD-10-CM | POA: Diagnosis not present

## 2020-03-22 DIAGNOSIS — R69 Illness, unspecified: Secondary | ICD-10-CM | POA: Diagnosis not present

## 2020-03-22 LAB — CBC WITH DIFFERENTIAL/PLATELET
Abs Immature Granulocytes: 0.06 10*3/uL (ref 0.00–0.07)
Basophils Absolute: 0.1 10*3/uL (ref 0.0–0.1)
Basophils Relative: 0 %
Eosinophils Absolute: 0 10*3/uL (ref 0.0–0.5)
Eosinophils Relative: 0 %
HCT: 44.6 % (ref 36.0–46.0)
Hemoglobin: 14.1 g/dL (ref 12.0–15.0)
Immature Granulocytes: 0 %
Lymphocytes Relative: 13 %
Lymphs Abs: 1.8 10*3/uL (ref 0.7–4.0)
MCH: 28 pg (ref 26.0–34.0)
MCHC: 31.6 g/dL (ref 30.0–36.0)
MCV: 88.5 fL (ref 80.0–100.0)
Monocytes Absolute: 0.8 10*3/uL (ref 0.1–1.0)
Monocytes Relative: 6 %
Neutro Abs: 11 10*3/uL — ABNORMAL HIGH (ref 1.7–7.7)
Neutrophils Relative %: 81 %
Platelets: 390 10*3/uL (ref 150–400)
RBC: 5.04 MIL/uL (ref 3.87–5.11)
RDW: 14.5 % (ref 11.5–15.5)
WBC: 13.7 10*3/uL — ABNORMAL HIGH (ref 4.0–10.5)
nRBC: 0 % (ref 0.0–0.2)

## 2020-03-22 LAB — I-STAT VENOUS BLOOD GAS, ED
Acid-base deficit: 2 mmol/L (ref 0.0–2.0)
Bicarbonate: 24.3 mmol/L (ref 20.0–28.0)
Calcium, Ion: 1.2 mmol/L (ref 1.15–1.40)
HCT: 43 % (ref 36.0–46.0)
Hemoglobin: 14.6 g/dL (ref 12.0–15.0)
O2 Saturation: 54 %
Potassium: 2.8 mmol/L — ABNORMAL LOW (ref 3.5–5.1)
Sodium: 143 mmol/L (ref 135–145)
TCO2: 26 mmol/L (ref 22–32)
pCO2, Ven: 44.5 mmHg (ref 44.0–60.0)
pH, Ven: 7.345 (ref 7.250–7.430)
pO2, Ven: 30 mmHg — CL (ref 32.0–45.0)

## 2020-03-22 LAB — COMPREHENSIVE METABOLIC PANEL
ALT: 19 U/L (ref 0–44)
AST: 63 U/L — ABNORMAL HIGH (ref 15–41)
Albumin: 3.4 g/dL — ABNORMAL LOW (ref 3.5–5.0)
Alkaline Phosphatase: 58 U/L (ref 38–126)
Anion gap: 16 — ABNORMAL HIGH (ref 5–15)
BUN: 10 mg/dL (ref 8–23)
CO2: 23 mmol/L (ref 22–32)
Calcium: 9.3 mg/dL (ref 8.9–10.3)
Chloride: 103 mmol/L (ref 98–111)
Creatinine, Ser: 0.9 mg/dL (ref 0.44–1.00)
GFR, Estimated: >60 mL/min (ref >60)
Glucose, Bld: 102 mg/dL — ABNORMAL HIGH (ref 70–99)
Potassium: 2.8 mmol/L — ABNORMAL LOW (ref 3.5–5.1)
Sodium: 142 mmol/L (ref 135–145)
Total Bilirubin: 1.6 mg/dL — ABNORMAL HIGH (ref 0.3–1.2)
Total Protein: 6.1 g/dL — ABNORMAL LOW (ref 6.5–8.1)

## 2020-03-22 LAB — PHOSPHORUS: Phosphorus: 5.5 mg/dL — ABNORMAL HIGH (ref 2.5–4.6)

## 2020-03-22 LAB — ETHANOL: Alcohol, Ethyl (B): <10 mg/dL (ref <10)

## 2020-03-22 LAB — CBG MONITORING, ED: Glucose-Capillary: 77 mg/dL (ref 70–99)

## 2020-03-22 LAB — AMMONIA: Ammonia: 13 umol/L (ref 9–35)

## 2020-03-22 LAB — LACTIC ACID, PLASMA
Lactic Acid, Venous: 2.3 mmol/L (ref 0.5–1.9)
Lactic Acid, Venous: 1.8 mmol/L (ref 0.5–1.9)

## 2020-03-22 LAB — MAGNESIUM: Magnesium: 2.2 mg/dL (ref 1.7–2.4)

## 2020-03-22 MED ORDER — RISPERIDONE 3 MG PO TABS
3.0000 mg | ORAL_TABLET | Freq: Every day | ORAL | Status: DC
Start: 2020-03-22 — End: 2020-03-25
  Administered 2020-03-23 – 2020-03-24 (×3): 3 mg via ORAL
  Filled 2020-03-22 (×5): qty 1

## 2020-03-22 MED ORDER — SODIUM CHLORIDE 0.9 % IV BOLUS
1000.0000 mL | Freq: Once | INTRAVENOUS | Status: AC
  Administered 2020-03-22: 1000 mL via INTRAVENOUS

## 2020-03-22 MED ORDER — POTASSIUM CHLORIDE 10 MEQ/100ML IV SOLN
10.0000 meq | INTRAVENOUS | Status: AC
  Administered 2020-03-23 (×2): 10 meq via INTRAVENOUS
  Filled 2020-03-22 (×2): qty 100

## 2020-03-22 MED ORDER — POTASSIUM CHLORIDE CRYS ER 20 MEQ PO TBCR
40.0000 meq | EXTENDED_RELEASE_TABLET | Freq: Once | ORAL | Status: AC
  Administered 2020-03-22: 40 meq via ORAL
  Filled 2020-03-22: qty 2

## 2020-03-22 MED ORDER — CYCLOBENZAPRINE HCL 10 MG PO TABS
10.0000 mg | ORAL_TABLET | Freq: Every day | ORAL | Status: DC
  Administered 2020-03-23 – 2020-03-24 (×3): 10 mg via ORAL
  Filled 2020-03-22 (×3): qty 1

## 2020-03-22 MED ORDER — VITAMIN B-12 1000 MCG PO TABS
1000.0000 ug | ORAL_TABLET | Freq: Every day | ORAL | Status: DC
  Administered 2020-03-23 – 2020-03-25 (×3): 1000 ug via ORAL
  Filled 2020-03-22 (×4): qty 1

## 2020-03-22 MED ORDER — ALPRAZOLAM 0.25 MG PO TABS
1.0000 mg | ORAL_TABLET | Freq: Two times a day (BID) | ORAL | Status: DC
  Administered 2020-03-23 – 2020-03-25 (×6): 1 mg via ORAL
  Filled 2020-03-22 (×6): qty 4

## 2020-03-22 MED ORDER — MAGNESIUM OXIDE 400 (241.3 MG) MG PO TABS
800.0000 mg | ORAL_TABLET | Freq: Once | ORAL | Status: AC
  Administered 2020-03-22: 800 mg via ORAL
  Filled 2020-03-22: qty 2

## 2020-03-22 MED ORDER — POTASSIUM CHLORIDE 10 MEQ/100ML IV SOLN
10.0000 meq | INTRAVENOUS | Status: AC
  Administered 2020-03-22 (×2): 10 meq via INTRAVENOUS
  Filled 2020-03-22 (×2): qty 100

## 2020-03-22 NOTE — ED Notes (Signed)
Patient placed on pure wick °

## 2020-03-22 NOTE — Discharge Instructions (Addendum)
Please call your neurologist and discuss with them about your worsening leg weakness.  See when they want to see you in the office or maybe make a change to your medications.

## 2020-03-22 NOTE — ED Triage Notes (Addendum)
To ED via [facility] -- has been falling frequently at home, pt has slow speech, normal for patient, pt has flat affect-- daughter checks on pt regularly-daily -- today pt fell outside and scraped face, and has multiple scrapes on knees from various falls.   D/C's thinner 2 weeks ago  In a recliner at triage-- nonweight bearing at the present  Pt does meet the ALONE criteria if daughter wants to be with her in the waiting room

## 2020-03-22 NOTE — ED Provider Notes (Signed)
[facility] EMERGENCY DEPARTMENT Provider Note   CSN: [id number] Arrival date & time: 03/22/20  1207     History Chief Complaint  Patient presents with  . Fall    [name] is a 65 y.o. female.  65 yo F with a chief complaint of frequent falls.  Patient states that she has been increasingly weak over the past couple days.  Patient was recently in the hospital about a month ago with a similar presentation.  Thought to be acutely deconditioned with a concomitant B12 deficiency.  Seen by neurology discharged to rehab facility.  Patient was just at the rehab facility for a few days now.  Feels like she is getting weak again and has been unable to ambulate safely at home.  Has had multiple falls.  Feels like maybe she hurt her left elbow and left knee.  Saw her behavioral health provider a couple days ago was continued on her home benzodiazepines.  The history is provided by the patient.  Fall This is a new problem. The current episode started yesterday. The problem occurs constantly. The problem has not changed since onset.Pertinent negatives include no chest pain, no headaches and no shortness of breath. Nothing aggravates the symptoms. Nothing relieves the symptoms. She has tried nothing for the symptoms. The treatment provided no relief.       Past Medical History:  Diagnosis Date  . Anxiety    anxiety and mild depressive order systoms  . Bipolar disorder ([place])     (02/20/2013)  . Cervical cancer ([place]) 03/19/1980  . Depression   . Dysrhythmia   . [id number])    "monthly" (02/20/2013)  . Pacemaker    medtronic  . PONV (postoperative nausea and vomiting)   . Sinus pause 02/14/2013  . Syncope and collapse    echo-April 12,2012-EF 55% Echo normal; recorder with prolonged sinus pauses --> status post  . Tobacco abuse   . Vertigo     Patient Active Problem List   Diagnosis Date Noted  . Myositis 03/03/2020  . HCAP (healthcare-associated pneumonia)  02/21/2020  . Cough 02/21/2020  . Generalized weakness 02/21/2020  . GAD (generalized anxiety disorder) 02/21/2020  . Proximal muscle weakness 02/08/2020  . Dysphagia 02/08/2020  . Gait disturbance 01/04/2020  . Elevated CK 01/04/2020  . Weakness of both lower extremities 01/04/2020  . Encephalopathy   . Polypharmacy   . CAP (community acquired pneumonia) 12/18/2019  . Fall 12/18/2019  . Hypokalemia   . Abnormal liver function test   . Cervical spondylosis 09/23/2019  . Bilateral elbow joint pain 08/26/2019  . Memory loss 07/02/2019  . Depression with anxiety 07/02/2019  . B12 deficiency 07/02/2019  . Lumbar radiculopathy 02/10/2019  . History of fusion of lumbar spine 02/10/2019  . Dysesthesia 02/10/2019  . Retroperitoneal fibrosis   . Pelvic adhesions   . Left ovarian cyst 11/17/2018  . Elevated tumor markers 11/17/2018  . Dyspnea 09/15/2018  . Chest pain of uncertain etiology [id number]  . Laryngopharyngeal reflux (LPR) 05/19/2018  . Mood disorder ([place]) 04/16/2018  . Insomnia 04/16/2018  . Attention deficit hyperactivity disorder (ADHD) 04/16/2018  . Neck pain on right side 09/09/2014  . 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 Family history of arrhythmogenic right ventricular cardiomyopathy 03/30/2013  . 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 02/20/2013  . Sinus pause 02/14/2013  . Hx of syncope- s/p Loop recorder 11/06/12 10/29/2012  . Vertigo 10/29/2012  . Tobacco abuse 10/29/2012  . ANXIETY 11/14/2009  .  CERUMEN IMPACTION, RIGHT 11/14/2009  . BENIGN POSITIONAL VERTIGO 11/14/2009  . EAR PAIN, RIGHT 11/14/2009  . SCIATICA 05/09/2009    Past Surgical History:  Procedure Laterality Date  . ABDOMINAL HYSTERECTOMY  03/19/1980   "partial"  . BACK SURGERY    . CERVICAL FUSION Left 11/2013   C4-C6  . CERVICAL FUSION     3,4,5,  . INSERT / REPLACE / REMOVE PACEMAKER  02/20/2013   medtronic  . LOOP RECORDER IMPLANT N/A 11/06/2012   Procedure:  LINQ LOOP RECORDER IMPLANT;  Surgeon: Sanda Klein, MD;  Location: [facility] CATH LAB;  Service: Cardiovascular;  Laterality: N/A;  . NECK SURGERY  11/17/2013  . PACEMAKER PLACEMENT N/A 02/16/2013  . PERMANENT PACEMAKER INSERTION N/A 02/20/2013   Procedure: PERMANENT PACEMAKER INSERTION;  Surgeon: Sanda Klein, MD;  Location: [facility] CATH LAB;  Service: Cardiovascular;  Laterality: N/A;  . POSTERIOR LUMBAR FUSION  ~ 2009  . ROBOTIC ASSISTED SALPINGO OOPHERECTOMY Bilateral 11/25/2018   Procedure: XI ROBOTIC ASSISTED BILATERAL SALPINGO OOPHORECTOMY and LYSIS OF ADHESIONS.;  Surgeon: Everitt Amber, MD;  Location: [facility] ORS;  Service: Gynecology;  Laterality: Bilateral;  . SPINAL FUSION  03/19/2014  . TONSILLECTOMY    . TRANSTHORACIC ECHOCARDIOGRAM  07/11/2010   The left atrial size is normal.There is no evidence of mitral vavle prolaspe.Right ventriicular systolic pressure is normal . Injection of contrast documented no interatrial shunt. Essentially normal 2D echo -doppler study      OB History   No obstetric history on file.     Family History  Adopted: Yes  Problem Relation Age of Onset  . Heart attack Brother     Social History   Tobacco Use  . Smoking status: Current Every Day Smoker    Packs/day: 0.50    Years: 40.00    Pack years: 20.00    Types: Cigarettes  . Smokeless tobacco: Never Used  . Tobacco comment: 14 cigarette per day  Vaping Use  . Vaping Use: Never used  Substance Use Topics  . Alcohol use: No  . Drug use: No    Home Medications Prior to Admission medications   Medication Sig Start Date End Date Taking? Authorizing Provider  acetaminophen (TYLENOL) 325 MG tablet Take 2 tablets (650 mg total) by mouth every 6 (six) hours as needed for mild pain (or Fever >/= 101). 02/26/20   Domenic Polite, MD  ALPRAZolam Duanne Moron) 1 MG tablet 1 po bid prn and occas extra 1 po mid-day. 03/17/20   Donnal Moat T, PA-C  cyclobenzaprine (FLEXERIL) 10 MG tablet Take 10 mg by mouth at  bedtime.  12/30/19   [provider]  risperiDONE (RISPERDAL) 3 MG tablet Take 1 tablet (3 mg total) by mouth at bedtime. 01/29/20   Donnal Moat T, PA-C  vitamin B-12 1000 MCG tablet Take 1 tablet (1,000 mcg total) by mouth daily. 02/27/20   Domenic Polite, MD    Allergies    Penicillins  Review of Systems   Review of Systems  Constitutional: Negative for chills and fever.  HENT: Negative for congestion and rhinorrhea.   Eyes: Negative for redness and visual disturbance.  Respiratory: Negative for shortness of breath and wheezing.   Cardiovascular: Negative for chest pain and palpitations.  Gastrointestinal: Negative for nausea and vomiting.  Genitourinary: Negative for dysuria and urgency.  Musculoskeletal: Positive for arthralgias. Negative for myalgias.  Skin: Negative for pallor and wound.  Neurological: Positive for weakness (generalized). Negative for dizziness and headaches.    Physical Exam Updated Vital Signs  BP 114/75   Pulse 83   Temp 97.9 F (36.6 C) (Oral)   Resp 19   SpO2 98%   Physical Exam Vitals and nursing note reviewed.  Constitutional:      General: She is not in acute distress.    Appearance: She is well-developed and well-nourished. She is not diaphoretic.     Comments: Cachectic  HENT:     Head: Normocephalic and atraumatic.  Eyes:     Extraocular Movements: EOM normal.     Pupils: Pupils are equal, round, and reactive to light.  Cardiovascular:     Rate and Rhythm: Normal rate and regular rhythm.     Heart sounds: No murmur heard. No friction rub. No gallop.   Pulmonary:     Effort: Pulmonary effort is normal.     Breath sounds: No wheezing or rales.  Abdominal:     General: There is no distension.     Palpations: Abdomen is soft.     Tenderness: There is no abdominal tenderness.  Musculoskeletal:        General: No tenderness or edema.     Cervical back: Normal range of motion and neck supple.  Skin:    General: Skin is  warm and dry.  Neurological:     Mental Status: She is alert and oriented to person, place, and time.  Psychiatric:        Mood and Affect: Mood and affect normal. Affect is flat.        Behavior: Behavior normal.     ED Results / Procedures / Treatments   Labs (all labs ordered are listed, but only abnormal results are displayed) Labs Reviewed  LACTIC ACID, PLASMA - Abnormal; Notable for the following components:      Result Value   Lactic Acid, Venous 2.3 (*)    All other components within normal limits  COMPREHENSIVE METABOLIC PANEL - Abnormal; Notable for the following components:   Potassium 2.8 (*)    Glucose, Bld 102 (*)    Total Protein 6.1 (*)    Albumin 3.4 (*)    AST 63 (*)    Total Bilirubin 1.6 (*)    Anion gap 16 (*)    All other components within normal limits  CBC WITH DIFFERENTIAL/PLATELET - Abnormal; Notable for the following components:   WBC 13.7 (*)    Neutro Abs 11.0 (*)    All other components within normal limits  PHOSPHORUS - Abnormal; Notable for the following components:   Phosphorus 5.5 (*)    All other components within normal limits  I-STAT VENOUS BLOOD GAS, ED - Abnormal; Notable for the following components:   pO2, Ven 30.0 (*)    Potassium 2.8 (*)    All other components within normal limits  URINE CULTURE  LACTIC ACID, PLASMA  AMMONIA  ETHANOL  MAGNESIUM  URINALYSIS, ROUTINE W REFLEX MICROSCOPIC  RAPID URINE DRUG SCREEN, HOSP PERFORMED  BASIC METABOLIC PANEL  CBG MONITORING, ED    EKG EKG Interpretation  Date/Time:  Tuesday March 22 2020 19:55:46 EST Ventricular Rate:  91 PR Interval:    QRS Duration: 84 QT Interval:  389 QTC Calculation: 479 R Axis:   10 Text Interpretation: Sinus rhythm Abnormal R-wave progression, early transition Minimal ST depression, inferior leads No significant change since last tracing Confirmed by Melene Plan [phone]) on 03/22/2020 8:54:21 PM   Radiology DG Chest 2 View  Result Date:  03/22/2020 CLINICAL DATA:  Approximately 15 falls in the past month.  Smoker. EXAM: CHEST - 2 VIEW COMPARISON:  02/21/2020 FINDINGS: Normal sized heart. Clear lungs. 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 Thoracic spine degenerative changes. No fracture or pneumothorax seen. IMPRESSION: No acute abnormality. Electronically Signed   By: Claudie Revering M.D.   On: 03/22/2020 12:53   CT HEAD WO CONTRAST  Result Date: 03/22/2020 CLINICAL DATA:  Frequent falls at home, including today with facial injury. Slow speech. Flat affect. EXAM: CT HEAD WITHOUT CONTRAST TECHNIQUE: Contiguous axial images were obtained from the base of the skull through the vertex without intravenous contrast. COMPARISON:  02/21/2020 head CT. FINDINGS: Brain: No evidence of parenchymal hemorrhage or extra-axial fluid collection. No mass lesion, mass effect, or midline shift. No CT evidence of acute infarction. Cerebral volume is age appropriate. No ventriculomegaly. Vascular: No acute abnormality. Skull: No evidence of calvarial fracture. Sinuses/Orbits: The visualized paranasal sinuses are essentially clear. Other:  The mastoid air cells are unopacified. IMPRESSION: Negative head CT. No evidence of acute intracranial abnormality. No evidence of calvarial fracture. Electronically Signed   By: Ilona Sorrel M.D.   On: 03/22/2020 19:49    Procedures Procedures (including critical care time)  Medications Ordered in ED Medications  potassium chloride 10 mEq in 100 mL IVPB (has no administration in time range)  sodium chloride 0.9 % bolus 1,000 mL (0 mLs Intravenous Stopped 03/22/20 2316)  potassium chloride SA (KLOR-CON) CR tablet 40 mEq (40 mEq Oral Given 03/22/20 2043)  potassium chloride 10 mEq in 100 mL IVPB (0 mEq Intravenous Stopped 03/22/20 2258)  magnesium oxide (MAG-OX) tablet 800 mg (800 mg Oral Given 03/22/20 2113)    ED Course  I have reviewed the triage vital signs and the nursing notes.  Pertinent labs & imaging  results that were available during my care of the patient were reviewed by me and considered in my medical decision making (see chart for details).    MDM Rules/Calculators/A&P                          65 yo F with a chief complaints of increased fatigue and generalized weakness.  Patient was just in the hospital for similar symptoms.  Went to rehab and was discharged few days ago.  Since then she has had a precipitous decline.  Will obtain a laboratory evaluation.  Was noted to be hypotensive and the waiting room and was brought back to the room.  No obvious infectious symptoms.  Feel less likely to be sepsis more likely to be hypovolemia.    The patient is noted to have a MAP's <65/ SBP's <90. With the current information available to me, I don't think the patient is in septic shock. The MAP's <65/ SBP's <90, is related to Moro .   Patient's blood pressure has remained stable after IV fluid administration.  I had asked the nurse to walk the patient though it appears the patient is nonambulatory at baseline.  States that typically she rides in a scooter.  I then asked why she had been falling.  It seems that she tries to stand of her own volition and then when she is unable collapses to the ground.  It seems that the patient is failing to thrive at home.     I discussed the case with Dr. Tobias Alexander, neurology.  He reviewed the patient's outpatient EMG and did not feel that there was any benefits of acute hospitalization from a neurologic standpoint.  He felt the  patient would be best managed as an outpatient.  He recommended checking a magnesium level as well as a phosphate level.  Recommended replenishing her potassium.   MG normal, phos slightly elevated.    Social work eval, will attempt to get home health ordered for the patient.   Some difficulty getting a hold of the patient's family.  May need to observe the patient overnight in the emergency department.  BMP ordered  for the morning.   The patients results and plan were reviewed and discussed.   Any x-rays performed were independently reviewed by myself.   Differential diagnosis were considered with the presenting HPI.  Medications  potassium chloride 10 mEq in 100 mL IVPB (has no administration in time range)  sodium chloride 0.9 % bolus 1,000 mL (0 mLs Intravenous Stopped 03/22/20 2316)  potassium chloride SA (KLOR-CON) CR tablet 40 mEq (40 mEq Oral Given 03/22/20 2043)  potassium chloride 10 mEq in 100 mL IVPB (0 mEq Intravenous Stopped 03/22/20 2258)  magnesium oxide (MAG-OX) tablet 800 mg (800 mg Oral Given 03/22/20 2113)    Vitals:   03/22/20 2200 03/22/20 2230 03/22/20 2245 03/22/20 2315  BP: 114/71 100/69 110/70 114/75  Pulse: 82 78 84 83  Resp: 15 17 16 19   Temp:      TempSrc:      SpO2: 100% 99% 99% 98%    Final diagnoses:  Muscle weakness of lower extremity         Final Clinical Impression(s) / ED Diagnoses Final diagnoses:  Muscle weakness of lower extremity    Rx / DC Orders ED Discharge Orders    None       Deno Etienne, DO 03/23/20 0002

## 2020-03-22 NOTE — Progress Notes (Signed)
   03/22/20 2147  TOC ED Mini Assessment  TOC Time spent with patient (minutes): 20  What brought you to the Emergency Department?  Recurrent Falls recently discharged from [facility] interventions ED RNCM  met with patient at bedside to discuss assistance with transitional care needs. Patient reports recent 12/31 discharge from [facility] has not received HH of yet, CM offered to  assist with arranging [facility] from ED. Patient is agreeable and ask me contact daughter with this information.  CM attempted to contact daughter [name] [phone], no answer no VM set up.  Patient has [place] which is in network with [facility] due  to staffing issues and limitations with HH start of care may be pushed back to 1/8.  Patient is agreeable.

## 2020-03-23 DIAGNOSIS — M6281 Muscle weakness (generalized): Secondary | ICD-10-CM | POA: Diagnosis not present

## 2020-03-23 LAB — URINALYSIS, ROUTINE W REFLEX MICROSCOPIC
Bacteria, UA: NONE SEEN
Bilirubin Urine: NEGATIVE
Glucose, UA: NEGATIVE mg/dL
Hgb urine dipstick: NEGATIVE
Ketones, ur: 20 mg/dL — AB
Leukocytes,Ua: NEGATIVE
Nitrite: NEGATIVE
Protein, ur: 100 mg/dL — AB
Specific Gravity, Urine: 1.016 (ref 1.005–1.030)
pH: 5 (ref 5.0–8.0)

## 2020-03-23 LAB — BASIC METABOLIC PANEL
Anion gap: 13 (ref 5–15)
BUN: 7 mg/dL — ABNORMAL LOW (ref 8–23)
CO2: 19 mmol/L — ABNORMAL LOW (ref 22–32)
Calcium: 7.7 mg/dL — ABNORMAL LOW (ref 8.9–10.3)
Chloride: 109 mmol/L (ref 98–111)
Creatinine, Ser: 0.81 mg/dL (ref 0.44–1.00)
GFR, Estimated: >60 mL/min (ref >60)
Glucose, Bld: 69 mg/dL — ABNORMAL LOW (ref 70–99)
Potassium: 3.2 mmol/L — ABNORMAL LOW (ref 3.5–5.1)
Sodium: 141 mmol/L (ref 135–145)

## 2020-03-23 LAB — RAPID URINE DRUG SCREEN, HOSP PERFORMED
Amphetamines: NOT DETECTED
Barbiturates: NOT DETECTED
Benzodiazepines: POSITIVE — AB
Cocaine: NOT DETECTED
Opiates: NOT DETECTED
Tetrahydrocannabinol: NOT DETECTED

## 2020-03-23 NOTE — Progress Notes (Signed)
CSW received message from [place] at [facility] that Pt is eligible to return for further SNF services.  PT consult was place by EDP, Lacinda Axon will initiate insurance authorization.

## 2020-03-23 NOTE — Progress Notes (Signed)
CSW messaged [name] '@[email or web address]' . Pt will be returning to [facility] for further SNF as soon as authorization is obtained.  Spoke with daughter [name] to update.  Met with Pt at bedside. Pt reports recent personal trauma, lost husband to suicide several years ago. CSW and Pt discussed the need for continued mental health maintenance after trauma. Pt agreed to seek counseling while at [facility].  CSW will coordinate with family for referral for such services.

## 2020-03-23 NOTE — ED Notes (Signed)
Food was offered. Pt states she is not hungry

## 2020-03-23 NOTE — Progress Notes (Signed)
CSW notofied that pt is in need of possible SNF placement vs. Home health. CSW spoke with pt at bedside.   CSW introduced role pt and invited pt to converse with pt about what her needs are as well as her desire. Pt expressed that she was at [facility] in the past but was discharged home. Per chart CSW noted that pt is from home with [facility] services expected to be in place on 03/26/20. CSW asked pt about this and pt expressed that this was accurate and that she was fine with these services in place. CSW asked for permission from pt to speak with daughter Arlyss Repress as it appears that daughter was contacted on 03/22/20 with no answer. Pt was agreeable to CSW speaking with daughter.    CSW reached out to daughter Arlyss Repress and was advised that pt has been having a number of falls. [name] reports that pt was sent to [facility] towards th end of last year. Per [name] she and family received a letter from insurance expressing that they would be discontinuing services therefore pt would be discharged form the family or family would have to be private pay. [name] expressed that due to pt's need at that time they decided to pay out of pocket for 9 days so that pt could remain at that facility and get care. [name] expressed that pt was discharged home with [facility] however no one has been out of yet. CSW advised [name] of RNCM note in chart that [facility] would likely began on 03/26/20 per note. [name] presented as understanding go this but also reported to CSW that she feels that pt needs further care as pt has declined since being back back. [name] expressed that she is interested in placement for pt so that pt can regain strength.   CSW followed up with Whitney Post from [facility] to se about further placement back wit this facility. Logan expressed that she would follow up with her supervisor and then call CSW back regarding placement. Per Whitney Post, If they are able to take pt, insurance auth would have to be started over and may take a few days. CSW  expressed understanding and updated pt and daughter of this. At this time, CSW awaiting call back from [place] with [facility].    Claude Manges Daphine Loch, MSW, LCSW [facility] [phone]

## 2020-03-23 NOTE — [place] FL2 (Signed)
[place] MEDICAID FL2 LEVEL OF CARE SCREENING TOOL     IDENTIFICATION  Patient Name: [name] Birthdate: [birth year] Sex: female Admission Date (Current Location): 03/22/2020  [facility] and [place] Number:  Herbalist and Address:  [facility], [place] [address], [place], [place] [address]      Provider Number: [id number]  Attending Physician Name and Address:  Default, Provider, MD  Relative Name and Phone Number:  OLLIE, MCDAID Daughter [phone]    Current Level of Care: Hospital Recommended Level of Care: [place] Prior Approval Number:    Date Approved/Denied:   PASRR Number: [id number]  Discharge Plan: SNF    Current Diagnoses: Patient Active Problem List   Diagnosis Date Noted  . Myositis 03/03/2020  . HCAP (healthcare-associated pneumonia) 02/21/2020  . Cough 02/21/2020  . Generalized weakness 02/21/2020  . GAD (generalized anxiety disorder) 02/21/2020  . Proximal muscle weakness 02/08/2020  . Dysphagia 02/08/2020  . Gait disturbance 01/04/2020  . Elevated CK 01/04/2020  . Weakness of both lower extremities 01/04/2020  . Encephalopathy   . Polypharmacy   . CAP (community acquired pneumonia) 12/18/2019  . Fall 12/18/2019  . Hypokalemia   . Abnormal liver function test   . Cervical spondylosis 09/23/2019  . Bilateral elbow joint pain 08/26/2019  . Memory loss 07/02/2019  . Depression with anxiety 07/02/2019  . B12 deficiency 07/02/2019  . Lumbar radiculopathy 02/10/2019  . History of fusion of lumbar spine 02/10/2019  . Dysesthesia 02/10/2019  . Retroperitoneal fibrosis   . Pelvic adhesions   . Left ovarian cyst 11/17/2018  . Elevated tumor markers 11/17/2018  . Dyspnea 09/15/2018  . Chest pain of uncertain etiology [id number]  . Laryngopharyngeal reflux (LPR) 05/19/2018  . Mood disorder ([place]) 04/16/2018  . Insomnia 04/16/2018  . Attention deficit hyperactivity disorder (ADHD) 04/16/2018  .  Neck pain on right side 09/09/2014  . 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 Family history of arrhythmogenic right ventricular cardiomyopathy 03/30/2013  . 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 02/20/2013  . Sinus pause 02/14/2013  . Hx of syncope- s/p Loop recorder 11/06/12 10/29/2012  . Vertigo 10/29/2012  . Tobacco abuse 10/29/2012  . ANXIETY 11/14/2009  . CERUMEN IMPACTION, RIGHT 11/14/2009  . BENIGN POSITIONAL VERTIGO 11/14/2009  . EAR PAIN, RIGHT 11/14/2009  . SCIATICA 05/09/2009    Orientation RESPIRATION BLADDER Height & Weight     Self,Time,Situation,Place  Normal Continent Weight:   Height:     BEHAVIORAL SYMPTOMS/MOOD NEUROLOGICAL BOWEL NUTRITION STATUS      Continent Diet (regular)  AMBULATORY STATUS COMMUNICATION OF NEEDS Skin   Extensive Assist Verbally Normal                       Personal Care Assistance Level of Assistance  Bathing,Feeding,Dressing Bathing Assistance: Limited assistance Feeding assistance: Independent Dressing Assistance: Limited assistance     Functional Limitations Info  Sight,Hearing,Speech Sight Info: Adequate Hearing Info: Adequate Speech Info: Adequate    SPECIAL CARE FACTORS FREQUENCY  PT (By licensed PT),OT (By licensed OT)     PT Frequency: 5x weekly OT Frequency: 5x weekly            Contractures Contractures Info: Not present    Additional Factors Info  Code Status,Allergies Code Status Info: Full Allergies Info: Penicillins           Current Medications (03/23/2020):  This is the current hospital active medication list Current Facility-Administered Medications  Medication Dose Route Frequency Provider Last Rate Last Admin  . ALPRAZolam Prudy Feeler) tablet 1 mg  1 mg Oral BID Melene Plan, DO   1 mg at 03/23/20 1340  . cyclobenzaprine (FLEXERIL) tablet 10 mg  10 mg Oral QHS Melene Plan, DO   10 mg at 03/23/20 0037  . risperiDONE (RISPERDAL) tablet 3 mg  3 mg Oral QHS Melene Plan, DO   3 mg  at 03/23/20 0038  . vitamin B-12 (CYANOCOBALAMIN) tablet 1,000 mcg  1,000 mcg Oral Daily Melene Plan, DO   1,000 mcg at 03/23/20 1340   Current Outpatient Medications  Medication Sig Dispense Refill  . acetaminophen (TYLENOL) 325 MG tablet Take 2 tablets (650 mg total) by mouth every 6 (six) hours as needed for mild pain (or Fever >/= 101).    Marland Kitchen ALPRAZolam (XANAX) 1 MG tablet 1 po bid prn and occas extra 1 po mid-day. 75 tablet 2  . cyclobenzaprine (FLEXERIL) 10 MG tablet Take 10 mg by mouth at bedtime.     . risperiDONE (RISPERDAL) 3 MG tablet Take 1 tablet (3 mg total) by mouth at bedtime. 90 tablet 1  . vitamin B-12 1000 MCG tablet Take 1 tablet (1,000 mcg total) by mouth daily.       Discharge Medications: Please see discharge summary for a list of discharge medications.  Relevant Imaging Results:  Relevant Lab Results:   Additional Information SSN [id number]  Has had Covid Vaccines  Idalie Canto, LCSW

## 2020-03-23 NOTE — Evaluation (Signed)
Physical Therapy Evaluation Patient Details Name: [name] MRN: [id number] DOB: 15-May-1955 Today's Date: 03/23/2020   History of Present Illness  Pt is a 65 y/o female presenting to the ED secondary to multiple falls. PMH includes Bipolar, cervical cancer, s/p pacemaker, and vertigo.  Clinical Impression  Pt admitted secondary to problem above with deficits below. Pt requiring mod A for bed mobility to stand and min A to take side steps at EOB. Pt heavily relying on support from stretcher to maintain balance. Did note bilateral knee instability as well. Feel pt will benefit from SNF level therapies at d/c. Will continue to follow acutely.     Follow Up Recommendations SNF;Supervision/Assistance - 24 hour    Equipment Recommendations  Wheelchair (measurements PT);Wheelchair cushion (measurements PT)    Recommendations for Other Services       Precautions / Restrictions Precautions Precautions: Fall Precaution Comments: Multiple falls prior to presentation. Restrictions Weight Bearing Restrictions: No      Mobility  Bed Mobility Overal bed mobility: Needs Assistance Bed Mobility: Supine to Sit;Sit to Supine     Supine to sit: Mod assist Sit to supine: Mod assist   General bed mobility comments: Mod A for trunk and LE assist. Increased time required to perform.    Transfers Overall transfer level: Needs assistance Equipment used: 1 person hand held assist Transfers: Sit to/from Stand Sit to Stand: Mod assist         General transfer comment: Mod A for lift assist and steadying. Cues for upright posture. Pt with knees flexed throughout and relying on bed on back of legs for support. PT standing in front of pt and had pt hold to PT arms  Ambulation/Gait Ambulation/Gait assistance: Min assist   Assistive device: 1 person hand held assist       General Gait Details: Took side steps at edge of stretcher. Pt with flexed knees and relying on stretcher for support.  Min A for stability. LEs buckling during side steps and pt reporting increased pain, so further mobility deferred.  Stairs            Wheelchair Mobility    Modified Rankin (Stroke Patients Only)       Balance Overall balance assessment: Needs assistance Sitting-balance support: No upper extremity supported;Feet supported Sitting balance-Leahy Scale: Fair     Standing balance support: Bilateral upper extremity supported;During functional activity Standing balance-Leahy Scale: Poor Standing balance comment: Reliant on BUE support and external support.                             Pertinent Vitals/Pain Pain Assessment: 0-10 Pain Score: 10-Worst pain ever Pain Location: front of BLE Pain Descriptors / Indicators: Guarding;Grimacing Pain Intervention(s): Limited activity within patient's tolerance;Monitored during session;Repositioned    Home Living Family/patient expects to be discharged to:: Private residence Living Arrangements: Alone Available Help at Discharge: Family;Available PRN/intermittently Type of Home: Apartment Home Access: Stairs to enter Entrance Stairs-Rails: Right Entrance Stairs-Number of Steps: flight Home Layout: One level Home Equipment: Walker - 2 wheels;Shower seat      Prior Function Level of Independence: Independent with assistive device(s)         Comments: requires use of RW for ambulation. Family assists with meals and housework. Has had multiple falls.     Hand Dominance        Extremity/Trunk Assessment   Upper Extremity Assessment Upper Extremity Assessment: Generalized weakness    Lower Extremity  Assessment Lower Extremity Assessment: Generalized weakness;RLE deficits/detail;LLE deficits/detail RLE Deficits / Details: Reporting bilateral LE pain LLE Deficits / Details: Reporting bilateral LE pain    Cervical / Trunk Assessment Cervical / Trunk Assessment: Kyphotic  Communication   Communication: No  difficulties  Cognition Arousal/Alertness: Awake/alert Behavior During Therapy: Flat affect Overall Cognitive Status: No family/caregiver present to determine baseline cognitive functioning                                 General Comments: Alert and oriented X4. Slow processing and slow to respond.      General Comments      Exercises     Assessment/Plan    PT Assessment Patient needs continued PT services  PT Problem List Decreased strength;Decreased balance;Decreased mobility;Decreased activity tolerance;Decreased cognition;Decreased knowledge of use of DME       PT Treatment Interventions DME instruction;Gait training;Functional mobility training;Stair training;Therapeutic activities;Therapeutic exercise;Balance training;Patient/family education    PT Goals (Current goals can be found in the Care Plan section)  Acute Rehab PT Goals Patient Stated Goal: to stop falling PT Goal Formulation: With patient Time For Goal Achievement: 04/06/20 Potential to Achieve Goals: Good    Frequency Min 2X/week   Barriers to discharge        Co-evaluation               AM-PAC PT "6 Clicks" Mobility  Outcome Measure Help needed turning from your back to your side while in a flat bed without using bedrails?: A Little Help needed moving from lying on your back to sitting on the side of a flat bed without using bedrails?: A Lot Help needed moving to and from a bed to a chair (including a wheelchair)?: A Lot Help needed standing up from a chair using your arms (e.g., wheelchair or bedside chair)?: A Lot Help needed to walk in hospital room?: A Lot Help needed climbing 3-5 steps with a railing? : Total 6 Click Score: 12    End of Session Equipment Utilized During Treatment: Gait belt Activity Tolerance: Patient limited by pain Patient left: in bed;with call bell/phone within reach (on stretcher in hallway of ED) Nurse Communication: Mobility status PT Visit  Diagnosis: Unsteadiness on feet (R26.81);Muscle weakness (generalized) (M62.81);History of falling (Z91.81);Repeated falls (R29.6)    Time: [id number] PT Time Calculation (min) (ACUTE ONLY): 13 min   Charges:   PT Evaluation $PT Eval Moderate Complexity: 1 Mod          Reuel Derby, PT, DPT  Acute Rehabilitation Services  Pager: [phone] Office: [phone]   Rudean Hitt 03/23/2020, 4:15 PM

## 2020-03-23 NOTE — ED Notes (Signed)
Sister contacted hospital with concerns over patient safety and lack of home health resources established. Will consult with social work.

## 2020-03-24 DIAGNOSIS — M6281 Muscle weakness (generalized): Secondary | ICD-10-CM | POA: Diagnosis not present

## 2020-03-24 LAB — SARS CORONAVIRUS 2 (TAT 6-24 HRS): SARS Coronavirus 2: NEGATIVE

## 2020-03-24 LAB — URINE CULTURE: Culture: NO GROWTH

## 2020-03-24 NOTE — ED Notes (Signed)
Patient repositioned  And clean linens applied.

## 2020-03-24 NOTE — ED Notes (Signed)
Patient currently sleeping. Chest rise and fall noted. Will continue to monitor the patient at this time.

## 2020-03-24 NOTE — ED Notes (Signed)
Pt taken off bed pan. No other needs at this time.

## 2020-03-24 NOTE — ED Notes (Signed)
Warm blanket given to pt and family updated.

## 2020-03-24 NOTE — Progress Notes (Signed)
CSW called Logan @ [facility]  [phone] to enquire about Pt status.  [facility] reports that they are still waiting on insurance authorization.

## 2020-03-24 NOTE — ED Notes (Signed)
Lunch Tray Ordered @ 1114.  

## 2020-03-24 NOTE — ED Notes (Signed)
Warm blanket given, patient comfortable, resting.

## 2020-03-25 DIAGNOSIS — Z20822 Contact with and (suspected) exposure to covid-19: Secondary | ICD-10-CM | POA: Diagnosis not present

## 2020-03-25 DIAGNOSIS — Z7401 Bed confinement status: Secondary | ICD-10-CM | POA: Diagnosis not present

## 2020-03-25 DIAGNOSIS — Z8616 Personal history of COVID-19: Secondary | ICD-10-CM | POA: Diagnosis not present

## 2020-03-25 DIAGNOSIS — M6281 Muscle weakness (generalized): Secondary | ICD-10-CM | POA: Diagnosis not present

## 2020-03-25 DIAGNOSIS — W19XXXA Unspecified fall, initial encounter: Secondary | ICD-10-CM | POA: Diagnosis not present

## 2020-03-25 DIAGNOSIS — R0902 Hypoxemia: Secondary | ICD-10-CM | POA: Diagnosis not present

## 2020-03-25 DIAGNOSIS — R2689 Other abnormalities of gait and mobility: Secondary | ICD-10-CM | POA: Diagnosis not present

## 2020-03-25 DIAGNOSIS — Z950 Presence of cardiac pacemaker: Secondary | ICD-10-CM | POA: Diagnosis not present

## 2020-03-25 DIAGNOSIS — U071 COVID-19: Secondary | ICD-10-CM | POA: Diagnosis not present

## 2020-03-25 DIAGNOSIS — R269 Unspecified abnormalities of gait and mobility: Secondary | ICD-10-CM | POA: Diagnosis not present

## 2020-03-25 DIAGNOSIS — I9589 Other hypotension: Secondary | ICD-10-CM | POA: Diagnosis not present

## 2020-03-25 DIAGNOSIS — Z79899 Other long term (current) drug therapy: Secondary | ICD-10-CM | POA: Diagnosis not present

## 2020-03-25 DIAGNOSIS — M2550 Pain in unspecified joint: Secondary | ICD-10-CM | POA: Diagnosis not present

## 2020-03-25 DIAGNOSIS — S0990XA Unspecified injury of head, initial encounter: Secondary | ICD-10-CM | POA: Diagnosis not present

## 2020-03-25 DIAGNOSIS — R296 Repeated falls: Secondary | ICD-10-CM | POA: Diagnosis not present

## 2020-03-25 DIAGNOSIS — E440 Moderate protein-calorie malnutrition: Secondary | ICD-10-CM | POA: Diagnosis not present

## 2020-03-25 DIAGNOSIS — D72828 Other elevated white blood cell count: Secondary | ICD-10-CM | POA: Diagnosis not present

## 2020-03-25 DIAGNOSIS — E876 Hypokalemia: Secondary | ICD-10-CM | POA: Diagnosis not present

## 2020-03-25 DIAGNOSIS — E538 Deficiency of other specified B group vitamins: Secondary | ICD-10-CM | POA: Diagnosis not present

## 2020-03-25 DIAGNOSIS — Z743 Need for continuous supervision: Secondary | ICD-10-CM | POA: Diagnosis not present

## 2020-03-25 DIAGNOSIS — J1282 Pneumonia due to coronavirus disease 2019: Secondary | ICD-10-CM | POA: Diagnosis not present

## 2020-03-25 DIAGNOSIS — G7089 Other specified myoneural disorders: Secondary | ICD-10-CM | POA: Diagnosis not present

## 2020-03-25 DIAGNOSIS — D513 Other dietary vitamin B12 deficiency anemia: Secondary | ICD-10-CM | POA: Diagnosis not present

## 2020-03-25 DIAGNOSIS — R5381 Other malaise: Secondary | ICD-10-CM | POA: Diagnosis not present

## 2020-03-25 DIAGNOSIS — R6520 Severe sepsis without septic shock: Secondary | ICD-10-CM | POA: Diagnosis not present

## 2020-03-25 DIAGNOSIS — R008 Other abnormalities of heart beat: Secondary | ICD-10-CM | POA: Diagnosis not present

## 2020-03-25 DIAGNOSIS — U099 Post covid-19 condition, unspecified: Secondary | ICD-10-CM | POA: Diagnosis not present

## 2020-03-25 DIAGNOSIS — R69 Illness, unspecified: Secondary | ICD-10-CM | POA: Diagnosis not present

## 2020-03-25 DIAGNOSIS — E162 Hypoglycemia, unspecified: Secondary | ICD-10-CM | POA: Diagnosis not present

## 2020-03-25 DIAGNOSIS — R3915 Urgency of urination: Secondary | ICD-10-CM | POA: Diagnosis not present

## 2020-03-25 MED ORDER — NICOTINE 21 MG/24HR TD PT24
21.0000 mg | MEDICATED_PATCH | Freq: Every day | TRANSDERMAL | Status: DC
  Administered 2020-03-25 (×2): 21 mg via TRANSDERMAL
  Filled 2020-03-25 (×2): qty 1

## 2020-03-25 MED ORDER — MELATONIN 5 MG PO TABS
5.0000 mg | ORAL_TABLET | Freq: Every day | ORAL | Status: DC
  Administered 2020-03-25: 5 mg via ORAL
  Filled 2020-03-25 (×2): qty 1

## 2020-03-25 MED ORDER — DIPHENHYDRAMINE HCL 25 MG PO CAPS
25.0000 mg | ORAL_CAPSULE | Freq: Once | ORAL | Status: AC
  Administered 2020-03-25: 25 mg via ORAL
  Filled 2020-03-25: qty 1

## 2020-03-25 NOTE — ED Notes (Signed)
[facility] was called and transportation was arranged.

## 2020-03-25 NOTE — Clinical Social Work Note (Signed)
TOC Supervisor received phone call from [facility] stating that patient has received insurance authorization and COVID result from 01/05 is still valid for admission today.  TOC conveyed to ED secretary who will update patient RN and arrange transport via [facility].  TOC Supervisor attempted to reach patient in room, however no answer.  RN to update patient of discharge today.  Contacted patient daughter [name] and left a message requesting return call to update on patient discharge.    Barbette Or, [place]

## 2020-03-25 NOTE — ED Notes (Signed)
Pt has complaints that she cannot sleep. [facility] MD made aware.

## 2020-03-25 NOTE — ED Notes (Signed)
Patient discharged with PTAR  IV removed.  VSS

## 2020-03-25 NOTE — ED Notes (Signed)
[facility] was called and informed them that the patient would be returning soon.Per Case manager patient is approved to go home.

## 2020-03-28 ENCOUNTER — Institutional Professional Consult (permissible substitution): Payer: Medicare HMO | Admitting: Emergency Medicine

## 2020-03-28 DIAGNOSIS — G7089 Other specified myoneural disorders: Secondary | ICD-10-CM | POA: Diagnosis not present

## 2020-03-28 DIAGNOSIS — R69 Illness, unspecified: Secondary | ICD-10-CM | POA: Diagnosis not present

## 2020-03-28 DIAGNOSIS — R2689 Other abnormalities of gait and mobility: Secondary | ICD-10-CM | POA: Diagnosis not present

## 2020-03-28 DIAGNOSIS — E440 Moderate protein-calorie malnutrition: Secondary | ICD-10-CM | POA: Diagnosis not present

## 2020-03-28 DIAGNOSIS — D72828 Other elevated white blood cell count: Secondary | ICD-10-CM | POA: Diagnosis not present

## 2020-03-28 DIAGNOSIS — R5381 Other malaise: Secondary | ICD-10-CM | POA: Diagnosis not present

## 2020-03-28 DIAGNOSIS — I9589 Other hypotension: Secondary | ICD-10-CM | POA: Diagnosis not present

## 2020-03-28 DIAGNOSIS — E876 Hypokalemia: Secondary | ICD-10-CM | POA: Diagnosis not present

## 2020-04-06 DIAGNOSIS — E538 Deficiency of other specified B group vitamins: Secondary | ICD-10-CM | POA: Diagnosis not present

## 2020-04-06 DIAGNOSIS — R269 Unspecified abnormalities of gait and mobility: Secondary | ICD-10-CM | POA: Diagnosis not present

## 2020-04-11 DIAGNOSIS — R296 Repeated falls: Secondary | ICD-10-CM | POA: Diagnosis not present

## 2020-04-11 DIAGNOSIS — R3915 Urgency of urination: Secondary | ICD-10-CM | POA: Diagnosis not present

## 2020-04-11 DIAGNOSIS — U071 COVID-19: Secondary | ICD-10-CM | POA: Diagnosis not present

## 2020-04-15 DIAGNOSIS — R0902 Hypoxemia: Secondary | ICD-10-CM | POA: Diagnosis not present

## 2020-04-15 DIAGNOSIS — I9589 Other hypotension: Secondary | ICD-10-CM | POA: Diagnosis not present

## 2020-04-15 DIAGNOSIS — R008 Other abnormalities of heart beat: Secondary | ICD-10-CM | POA: Diagnosis not present

## 2020-04-15 DIAGNOSIS — U071 COVID-19: Secondary | ICD-10-CM | POA: Diagnosis not present

## 2020-04-15 DIAGNOSIS — M6281 Muscle weakness (generalized): Secondary | ICD-10-CM | POA: Diagnosis not present

## 2020-04-16 ENCOUNTER — Other Ambulatory Visit: Payer: Self-pay

## 2020-04-16 ENCOUNTER — Emergency Department (HOSPITAL_COMMUNITY): Payer: Medicare HMO

## 2020-04-16 ENCOUNTER — Inpatient Hospital Stay (HOSPITAL_COMMUNITY)
Admission: EM | Admit: 2020-04-16 | Discharge: 2020-04-21 | DRG: 871 | Disposition: A | Discharge status: Skilled Nursing Facility | Payer: Medicare HMO | Attending: Internal Medicine | Admitting: Internal Medicine

## 2020-04-16 ENCOUNTER — Encounter (HOSPITAL_COMMUNITY): Payer: Self-pay

## 2020-04-16 DIAGNOSIS — E162 Hypoglycemia, unspecified: Secondary | ICD-10-CM | POA: Diagnosis present

## 2020-04-16 DIAGNOSIS — Z8541 Personal history of malignant neoplasm of cervix uteri: Secondary | ICD-10-CM

## 2020-04-16 DIAGNOSIS — Z90710 Acquired absence of both cervix and uterus: Secondary | ICD-10-CM

## 2020-04-16 DIAGNOSIS — E46 Unspecified protein-calorie malnutrition: Secondary | ICD-10-CM | POA: Diagnosis present

## 2020-04-16 DIAGNOSIS — U071 COVID-19: Secondary | ICD-10-CM | POA: Diagnosis not present

## 2020-04-16 DIAGNOSIS — Z79899 Other long term (current) drug therapy: Secondary | ICD-10-CM

## 2020-04-16 DIAGNOSIS — E876 Hypokalemia: Secondary | ICD-10-CM | POA: Diagnosis not present

## 2020-04-16 DIAGNOSIS — E538 Deficiency of other specified B group vitamins: Secondary | ICD-10-CM | POA: Diagnosis present

## 2020-04-16 DIAGNOSIS — F17210 Nicotine dependence, cigarettes, uncomplicated: Secondary | ICD-10-CM | POA: Diagnosis present

## 2020-04-16 DIAGNOSIS — Z681 Body mass index (BMI) 19 or less, adult: Secondary | ICD-10-CM

## 2020-04-16 DIAGNOSIS — R0902 Hypoxemia: Secondary | ICD-10-CM | POA: Diagnosis not present

## 2020-04-16 DIAGNOSIS — Z880 Allergy status to penicillin: Secondary | ICD-10-CM

## 2020-04-16 DIAGNOSIS — B964 Proteus (mirabilis) (morganii) as the cause of diseases classified elsewhere: Secondary | ICD-10-CM | POA: Diagnosis present

## 2020-04-16 DIAGNOSIS — R5381 Other malaise: Secondary | ICD-10-CM | POA: Diagnosis present

## 2020-04-16 DIAGNOSIS — E860 Dehydration: Secondary | ICD-10-CM | POA: Diagnosis not present

## 2020-04-16 DIAGNOSIS — N3000 Acute cystitis without hematuria: Secondary | ICD-10-CM | POA: Diagnosis present

## 2020-04-16 DIAGNOSIS — Z950 Presence of cardiac pacemaker: Secondary | ICD-10-CM

## 2020-04-16 DIAGNOSIS — J1282 Pneumonia due to coronavirus disease 2019: Secondary | ICD-10-CM | POA: Diagnosis present

## 2020-04-16 DIAGNOSIS — A419 Sepsis, unspecified organism: Secondary | ICD-10-CM | POA: Diagnosis not present

## 2020-04-16 DIAGNOSIS — F419 Anxiety disorder, unspecified: Secondary | ICD-10-CM | POA: Diagnosis present

## 2020-04-16 DIAGNOSIS — R627 Adult failure to thrive: Secondary | ICD-10-CM | POA: Diagnosis present

## 2020-04-16 DIAGNOSIS — Z743 Need for continuous supervision: Secondary | ICD-10-CM | POA: Diagnosis not present

## 2020-04-16 DIAGNOSIS — R519 Headache, unspecified: Secondary | ICD-10-CM | POA: Diagnosis present

## 2020-04-16 DIAGNOSIS — R0602 Shortness of breath: Secondary | ICD-10-CM | POA: Diagnosis not present

## 2020-04-16 DIAGNOSIS — F418 Other specified anxiety disorders: Secondary | ICD-10-CM | POA: Diagnosis present

## 2020-04-16 DIAGNOSIS — R000 Tachycardia, unspecified: Secondary | ICD-10-CM | POA: Diagnosis not present

## 2020-04-16 DIAGNOSIS — R0689 Other abnormalities of breathing: Secondary | ICD-10-CM | POA: Diagnosis not present

## 2020-04-16 DIAGNOSIS — F319 Bipolar disorder, unspecified: Secondary | ICD-10-CM | POA: Diagnosis present

## 2020-04-16 DIAGNOSIS — D649 Anemia, unspecified: Secondary | ICD-10-CM | POA: Diagnosis present

## 2020-04-16 LAB — CBG MONITORING, ED: Glucose-Capillary: 47 mg/dL — ABNORMAL LOW (ref 70–99)

## 2020-04-16 NOTE — ED Provider Notes (Signed)
[facility] EMERGENCY DEPARTMENT Provider Note   CSN: [id number] Arrival date & time: 04/16/20  2315     History Chief Complaint  Patient presents with  . Covid Positive    Level 5 caveat due to altered mental status [name] is a 65 y.o. female.  The history is provided by the patient. The history is limited by the condition of the patient.  Altered Mental Status Severity:  Moderate Timing:  Constant Progression:  Worsening Chronicity:  New Patient with history of anxiety, bipolar disorder, progressive leg weakness presents from nursing facility.  Patient reportedly was diagnosed with Covid a week ago.  Also reportedly had a UTI that has not been treated.  Patient has had decrease oral intake he has become "more lethargic ".  The nursing facility attempted subcu fluids without success.  Patient only mentions that she "does not feel good "     Past Medical History:  Diagnosis Date  . Anxiety    anxiety and mild depressive order systoms  . Bipolar disorder ([place])     (02/20/2013)  . Cervical cancer ([place]) 03/19/1980  . Depression   . Dysrhythmia   . WGYKZLDJ(570.1)    "monthly" (02/20/2013)  . Pacemaker    medtronic  . PONV (postoperative nausea and vomiting)   . Sinus pause 02/14/2013  . Syncope and collapse    echo-April 12,2012-EF 55% Echo normal; recorder with prolonged sinus pauses --> status post  . Tobacco abuse   . Vertigo     Patient Active Problem List   Diagnosis Date Noted  . Myositis 03/03/2020  . HCAP (healthcare-associated pneumonia) 02/21/2020  . Cough 02/21/2020  . Generalized weakness 02/21/2020  . GAD (generalized anxiety disorder) 02/21/2020  . Proximal muscle weakness 02/08/2020  . Dysphagia 02/08/2020  . Gait disturbance 01/04/2020  . Elevated CK 01/04/2020  . Weakness of both lower extremities 01/04/2020  . Encephalopathy   . Polypharmacy   . CAP (community acquired pneumonia) 12/18/2019  . Fall 12/18/2019  .  Hypokalemia   . Abnormal liver function test   . Cervical spondylosis 09/23/2019  . Bilateral elbow joint pain 08/26/2019  . Memory loss 07/02/2019  . Depression with anxiety 07/02/2019  . B12 deficiency 07/02/2019  . Lumbar radiculopathy 02/10/2019  . History of fusion of lumbar spine 02/10/2019  . Dysesthesia 02/10/2019  . Retroperitoneal fibrosis   . Pelvic adhesions   . Left ovarian cyst 11/17/2018  . Elevated tumor markers 11/17/2018  . Dyspnea 09/15/2018  . Chest pain of uncertain etiology 77/93/9030  . Laryngopharyngeal reflux (LPR) 05/19/2018  . Mood disorder ([place]) 04/16/2018  . Insomnia 04/16/2018  . Attention deficit hyperactivity disorder (ADHD) 04/16/2018  . Neck pain on right side 09/09/2014  . 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 Family history of arrhythmogenic right ventricular cardiomyopathy 03/30/2013  . 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 02/20/2013  . Sinus pause 02/14/2013  . Hx of syncope- s/p Loop recorder 11/06/12 10/29/2012  . Vertigo 10/29/2012  . Tobacco abuse 10/29/2012  . ANXIETY 11/14/2009  . CERUMEN IMPACTION, RIGHT 11/14/2009  . BENIGN POSITIONAL VERTIGO 11/14/2009  . EAR PAIN, RIGHT 11/14/2009  . SCIATICA 05/09/2009    Past Surgical History:  Procedure Laterality Date  . ABDOMINAL HYSTERECTOMY  03/19/1980   "partial"  . BACK SURGERY    . CERVICAL FUSION Left 11/2013   C4-C6  . CERVICAL FUSION     3,4,5,  . INSERT / REPLACE / REMOVE PACEMAKER  02/20/2013  medtronic  . LOOP RECORDER IMPLANT N/A 11/06/2012   Procedure: LINQ LOOP RECORDER IMPLANT;  Surgeon: Sanda Klein, MD;  Location: [facility] CATH LAB;  Service: Cardiovascular;  Laterality: N/A;  . NECK SURGERY  11/17/2013  . PACEMAKER PLACEMENT N/A 02/16/2013  . PERMANENT PACEMAKER INSERTION N/A 02/20/2013   Procedure: PERMANENT PACEMAKER INSERTION;  Surgeon: Sanda Klein, MD;  Location: [facility] CATH LAB;  Service: Cardiovascular;  Laterality: N/A;  . POSTERIOR LUMBAR  FUSION  ~ 2009  . ROBOTIC ASSISTED SALPINGO OOPHERECTOMY Bilateral 11/25/2018   Procedure: XI ROBOTIC ASSISTED BILATERAL SALPINGO OOPHORECTOMY and LYSIS OF ADHESIONS.;  Surgeon: Everitt Amber, MD;  Location: [facility];  Service: Gynecology;  Laterality: Bilateral;  . SPINAL FUSION  03/19/2014  . TONSILLECTOMY    . TRANSTHORACIC ECHOCARDIOGRAM  07/11/2010   The left atrial size is normal.There is no evidence of mitral vavle prolaspe.Right ventriicular systolic pressure is normal . Injection of contrast documented no interatrial shunt. Essentially normal 2D echo -doppler study      OB History   No obstetric history on file.     Family History  Adopted: Yes  Problem Relation Age of Onset  . Heart attack Brother     Social History   Tobacco Use  . Smoking status: Current Every Day Smoker    Packs/day: 0.50    Years: 40.00    Pack years: 20.00    Types: Cigarettes  . Smokeless tobacco: Never Used  . Tobacco comment: 14 cigarette per day  Vaping Use  . Vaping Use: Never used  Substance Use Topics  . Alcohol use: No  . Drug use: No    Home Medications Prior to Admission medications   Medication Sig Start Date End Date Taking? Authorizing Provider  acetaminophen (TYLENOL) 325 MG tablet Take 2 tablets (650 mg total) by mouth every 6 (six) hours as needed for mild pain (or Fever >/= 101). 02/26/20   Domenic Polite, MD  ALPRAZolam Duanne Moron) 1 MG tablet 1 po bid prn and occas extra 1 po mid-day. Patient taking differently: Take 1 mg by mouth 2 (two) times daily as needed for anxiety. 03/17/20   Donnal Moat T, PA-C  cyclobenzaprine (FLEXERIL) 10 MG tablet Take 10 mg by mouth at bedtime as needed for muscle spasms. 12/30/19   [provider]  risperiDONE (RISPERDAL) 3 MG tablet Take 1 tablet (3 mg total) by mouth at bedtime. 01/29/20   Donnal Moat T, PA-C  vitamin B-12 1000 MCG tablet Take 1 tablet (1,000 mcg total) by mouth daily. 02/27/20   Domenic Polite, MD    Allergies     Penicillins  Review of Systems   Review of Systems  Unable to perform ROS: Mental status change    Physical Exam Updated Vital Signs BP 132/71 (BP Location: Left Arm)   Pulse (!) 110   Temp 99.3 F (37.4 C) (Oral)   Resp (!) 32   Ht 1.702 m (5\' 7" )   Wt 53.1 kg   SpO2 96%   BMI 18.32 kg/m   Physical Exam CONSTITUTIONAL: Elderly and frail, resting with her eyes closed HEAD: Normocephalic/atraumatic EYES: EOMI/PERRL ENMT: Mask in place NECK: supple no meningeal signs SPINE/BACK:entire spine nontender CV: S1/S2 noted LUNGS: Tachypneic, scattered crackles bilaterally ABDOMEN: soft, nontender NEURO: Pt is resting with her eyes closed.  She does not answer most questions, but she will follow commands EXTREMITIES: pulses normal/equal, full ROM SKIN: warm, color normal PSYCH: Unable to assess  ED Results / Procedures / Treatments   Labs (  all labs ordered are listed, but only abnormal results are displayed) Labs Reviewed  CBC WITH DIFFERENTIAL/PLATELET - Abnormal; Notable for the following components:      Result Value   WBC 14.3 (*)    Hemoglobin 11.9 (*)    HCT 35.7 (*)    Neutro Abs 12.1 (*)    Abs Immature Granulocytes 0.10 (*)    All other components within normal limits  COMPREHENSIVE METABOLIC PANEL - Abnormal; Notable for the following components:   Potassium 2.9 (*)    CO2 21 (*)    Calcium 7.8 (*)    Total Protein 5.2 (*)    Albumin 2.7 (*)    Total Bilirubin 1.8 (*)    All other components within normal limits  URINALYSIS, ROUTINE W REFLEX MICROSCOPIC - Abnormal; Notable for the following components:   APPearance CLOUDY (*)    Glucose, UA >=500 (*)    Ketones, ur 20 (*)    Protein, ur 100 (*)    Leukocytes,Ua LARGE (*)    WBC, UA >50 (*)    Bacteria, UA FEW (*)    All other components within normal limits  CBG MONITORING, ED - Abnormal; Notable for the following components:   Glucose-Capillary 47 (*)    All other components within normal limits   POC SARS CORONAVIRUS 2 AG -  ED - Abnormal; Notable for the following components:   SARS Coronavirus 2 Ag POSITIVE (*)    All other components within normal limits  CBG MONITORING, ED - Abnormal; Notable for the following components:   Glucose-Capillary 186 (*)    All other components within normal limits    EKG EKG Interpretation  Date/Time:  Saturday April 16 2020 23:22:52 EST Ventricular Rate:  109 PR Interval:    QRS Duration: 73 QT Interval:  305 QTC Calculation: 411 R Axis:   24 Text Interpretation: Sinus tachycardia Borderline T abnormalities, anterior leads Interpretation limited secondary to artifact Confirmed by Ripley Fraise [id number]) on 04/16/2020 11:41:26 PM   Radiology DG Chest Port 1 View  Result Date: 04/17/2020 CLINICAL DATA:  Shortness of breath, COVID positive. EXAM: PORTABLE CHEST 1 VIEW.  Patient is rotated. COMPARISON:  Chest x-ray 03/22/2020 FINDINGS: Left chest wall 2 lead cardiac pacemaker in stable position. The heart size and mediastinal contours are unchanged. Aortic arch calcification. Biapical pleural/pulmonary scarring. Lower to mid lung zone vague hazy airspace opacity. No pulmonary edema. Interval development of a trace left pleural effusion. No pneumothorax. No acute osseous abnormality. Partially visualized cervical spine hardware. IMPRESSION: 1. Interval development of a trace left pleural effusion with underlying infection/inflammation not excluded. 2. Lower to mid lung zone vague hazy airspace opacity that could represent atelectasis versus infection/inflammation. Also limited evaluation due to patient rotation. 3. Followup PA and lateral chest X-ray is recommended in 3-4 weeks following therapy to ensure resolution and exclude underlying malignancy. Electronically Signed   By: Iven Finn M.D.   On: 04/17/2020 00:17    Procedures .Critical Care Performed by: Ripley Fraise, MD Authorized by: Ripley Fraise, MD   Critical care provider  statement:    Critical care time (minutes):  60   Critical care start time:  04/17/2020 1:00 AM   Critical care end time:  04/17/2020 2:00 AM   Critical care time was exclusive of:  Separately billable procedures and treating other patients   Critical care was necessary to treat or prevent imminent or life-threatening deterioration of the following conditions:  Respiratory failure and dehydration   Critical  care was time spent personally by me on the following activities:  Ordering and performing treatments and interventions, ordering and review of laboratory studies, pulse oximetry, ordering and review of radiographic studies, re-evaluation of patient's condition, review of old charts, development of treatment plan with patient or surrogate, discussions with consultants, evaluation of patient's response to treatment and examination of patient   I assumed direction of critical care for this patient from another provider in my specialty: no     Care discussed with: admitting provider       Medications Ordered in ED Medications  remdesivir 200 mg in sodium chloride 0.9% 250 mL IVPB (has no administration in time range)    Followed by  remdesivir 100 mg in sodium chloride 0.9 % 100 mL IVPB (has no administration in time range)  potassium chloride 10 mEq in 100 mL IVPB (10 mEq Intravenous New Bag/Given 04/17/20 0125)  cefTRIAXone (ROCEPHIN) 1 g in sodium chloride 0.9 % 100 mL IVPB (has no administration in time range)  dextrose 50 % solution (50 mLs  Given 04/17/20 0017)  lactated ringers bolus 1,000 mL (1,000 mLs Intravenous New Bag/Given 04/17/20 0122)    ED Course  I have reviewed the triage vital signs and the nursing notes.  Pertinent labs & imaging results that were available during my care of the patient were reviewed by me and considered in my medical decision making (see chart for details).    MDM Rules/Calculators/A&P                          12:01 AM Patient presents with nursing  facility for generalized weakness, altered mental status in the setting of COVID-19.  There is also report that she has an untreated UTI.  I have attempted to call [facility] rehab center but no one answered.  Patient is noted be hypoglycemic, will encourage p.o. fluids.  Patient apparently has a history of progressive lower extremity weakness of unclear etiology.  Labs and imaging are pending at this time 1:10 AM Glucose is improved, patient is more alert.  She is also noted to have hypokalemia as well as a UTI.  Will give IV potassium as well as IV ABX 2:14 AM Patient is still unable to take any p.o. fluids.  I am unable to contact anyone at the nursing facility.  Patient also persistently tachycardic.  Patient also noted to be hypoglycemic and hypokalemic.  Due to multiple medical conditions, patient be admitted to the hospital.  IV potassium and IV antibiotics have been started, remdesivir has also been ordered. Discussed with Dr. Myna Hidalgo for admission Final Clinical Impression(s) / ED Diagnoses Final diagnoses:  Acute cystitis without hematuria  Hypoglycemia  COVID-19  Hypokalemia    Rx / DC Orders ED Discharge Orders    None       Ripley Fraise, MD 04/17/20 [phone]

## 2020-04-16 NOTE — ED Triage Notes (Signed)
Pt bib [facility] from [facility] and rehab. Pt has had covid x1 week and had a UTI x1 wk per facility. Pt has not been eating or drinking and has become more lethargic per facility. They attempted SQ fluids at facility w/o success. Pt A&Ox3, states she does not feel good, has a non-productive cough, denies pain, nausea or vomiting.   EMS VS: BP=110/palp HR=120 02=90% room air

## 2020-04-17 ENCOUNTER — Encounter (HOSPITAL_COMMUNITY): Payer: Self-pay | Admitting: Family Medicine

## 2020-04-17 DIAGNOSIS — Z950 Presence of cardiac pacemaker: Secondary | ICD-10-CM | POA: Diagnosis not present

## 2020-04-17 DIAGNOSIS — B964 Proteus (mirabilis) (morganii) as the cause of diseases classified elsewhere: Secondary | ICD-10-CM | POA: Diagnosis not present

## 2020-04-17 DIAGNOSIS — R69 Illness, unspecified: Secondary | ICD-10-CM | POA: Diagnosis not present

## 2020-04-17 DIAGNOSIS — F418 Other specified anxiety disorders: Secondary | ICD-10-CM

## 2020-04-17 DIAGNOSIS — R627 Adult failure to thrive: Secondary | ICD-10-CM | POA: Diagnosis not present

## 2020-04-17 DIAGNOSIS — F419 Anxiety disorder, unspecified: Secondary | ICD-10-CM | POA: Diagnosis present

## 2020-04-17 DIAGNOSIS — E538 Deficiency of other specified B group vitamins: Secondary | ICD-10-CM | POA: Diagnosis not present

## 2020-04-17 DIAGNOSIS — E162 Hypoglycemia, unspecified: Secondary | ICD-10-CM | POA: Diagnosis not present

## 2020-04-17 DIAGNOSIS — J189 Pneumonia, unspecified organism: Secondary | ICD-10-CM

## 2020-04-17 DIAGNOSIS — R5381 Other malaise: Secondary | ICD-10-CM | POA: Diagnosis not present

## 2020-04-17 DIAGNOSIS — Z8541 Personal history of malignant neoplasm of cervix uteri: Secondary | ICD-10-CM | POA: Diagnosis not present

## 2020-04-17 DIAGNOSIS — A419 Sepsis, unspecified organism: Secondary | ICD-10-CM | POA: Diagnosis not present

## 2020-04-17 DIAGNOSIS — F17210 Nicotine dependence, cigarettes, uncomplicated: Secondary | ICD-10-CM | POA: Diagnosis present

## 2020-04-17 DIAGNOSIS — Z681 Body mass index (BMI) 19 or less, adult: Secondary | ICD-10-CM | POA: Diagnosis not present

## 2020-04-17 DIAGNOSIS — Z79899 Other long term (current) drug therapy: Secondary | ICD-10-CM | POA: Diagnosis not present

## 2020-04-17 DIAGNOSIS — R0602 Shortness of breath: Secondary | ICD-10-CM | POA: Diagnosis not present

## 2020-04-17 DIAGNOSIS — E46 Unspecified protein-calorie malnutrition: Secondary | ICD-10-CM | POA: Diagnosis not present

## 2020-04-17 DIAGNOSIS — F319 Bipolar disorder, unspecified: Secondary | ICD-10-CM | POA: Diagnosis present

## 2020-04-17 DIAGNOSIS — E876 Hypokalemia: Secondary | ICD-10-CM

## 2020-04-17 DIAGNOSIS — J90 Pleural effusion, not elsewhere classified: Secondary | ICD-10-CM | POA: Diagnosis not present

## 2020-04-17 DIAGNOSIS — D649 Anemia, unspecified: Secondary | ICD-10-CM | POA: Diagnosis present

## 2020-04-17 DIAGNOSIS — Z90710 Acquired absence of both cervix and uterus: Secondary | ICD-10-CM | POA: Diagnosis not present

## 2020-04-17 DIAGNOSIS — U071 COVID-19: Secondary | ICD-10-CM | POA: Diagnosis present

## 2020-04-17 DIAGNOSIS — R519 Headache, unspecified: Secondary | ICD-10-CM | POA: Diagnosis present

## 2020-04-17 DIAGNOSIS — N3000 Acute cystitis without hematuria: Secondary | ICD-10-CM | POA: Diagnosis not present

## 2020-04-17 DIAGNOSIS — N390 Urinary tract infection, site not specified: Secondary | ICD-10-CM | POA: Diagnosis not present

## 2020-04-17 DIAGNOSIS — Z880 Allergy status to penicillin: Secondary | ICD-10-CM | POA: Diagnosis not present

## 2020-04-17 DIAGNOSIS — J1282 Pneumonia due to coronavirus disease 2019: Secondary | ICD-10-CM | POA: Diagnosis not present

## 2020-04-17 LAB — URINALYSIS, ROUTINE W REFLEX MICROSCOPIC
Bilirubin Urine: NEGATIVE
Bilirubin Urine: NEGATIVE
Glucose, UA: >=500 mg/dL — AB
Glucose, UA: >=500 mg/dL — AB
Hgb urine dipstick: NEGATIVE
Hgb urine dipstick: NEGATIVE
Ketones, ur: 20 mg/dL — AB
Ketones, ur: 20 mg/dL — AB
Nitrite: NEGATIVE
Nitrite: NEGATIVE
Protein, ur: 100 mg/dL — AB
Protein, ur: 100 mg/dL — AB
Specific Gravity, Urine: 1.012 (ref 1.005–1.030)
Specific Gravity, Urine: 1.012 (ref 1.005–1.030)
WBC, UA: >50 WBC/hpf — ABNORMAL HIGH (ref 0–5)
pH: 8 (ref 5.0–8.0)
pH: 9 — ABNORMAL HIGH (ref 5.0–8.0)

## 2020-04-17 LAB — CBC WITH DIFFERENTIAL/PLATELET
Abs Immature Granulocytes: 0.04 10*3/uL (ref 0.00–0.07)
Abs Immature Granulocytes: 0.1 10*3/uL — ABNORMAL HIGH (ref 0.00–0.07)
Basophils Absolute: 0 10*3/uL (ref 0.0–0.1)
Basophils Absolute: 0 10*3/uL (ref 0.0–0.1)
Basophils Relative: 0 %
Basophils Relative: 0 %
Eosinophils Absolute: 0 10*3/uL (ref 0.0–0.5)
Eosinophils Absolute: 0 10*3/uL (ref 0.0–0.5)
Eosinophils Relative: 0 %
Eosinophils Relative: 0 %
HCT: 35.4 % — ABNORMAL LOW (ref 36.0–46.0)
HCT: 35.7 % — ABNORMAL LOW (ref 36.0–46.0)
Hemoglobin: 11.4 g/dL — ABNORMAL LOW (ref 12.0–15.0)
Hemoglobin: 11.9 g/dL — ABNORMAL LOW (ref 12.0–15.0)
Immature Granulocytes: 0 %
Immature Granulocytes: 1 %
Lymphocytes Relative: 10 %
Lymphocytes Relative: 8 %
Lymphs Abs: 1 10*3/uL (ref 0.7–4.0)
Lymphs Abs: 1.2 10*3/uL (ref 0.7–4.0)
MCH: 28.1 pg (ref 26.0–34.0)
MCH: 29 pg (ref 26.0–34.0)
MCHC: 32.2 g/dL (ref 30.0–36.0)
MCHC: 33.3 g/dL (ref 30.0–36.0)
MCV: 87.1 fL (ref 80.0–100.0)
MCV: 87.4 fL (ref 80.0–100.0)
Monocytes Absolute: 0.8 10*3/uL (ref 0.1–1.0)
Monocytes Absolute: 0.9 10*3/uL (ref 0.1–1.0)
Monocytes Relative: 6 %
Monocytes Relative: 8 %
Neutro Abs: 12.1 10*3/uL — ABNORMAL HIGH (ref 1.7–7.7)
Neutro Abs: 8.3 10*3/uL — ABNORMAL HIGH (ref 1.7–7.7)
Neutrophils Relative %: 82 %
Neutrophils Relative %: 85 %
Platelets: 263 10*3/uL (ref 150–400)
Platelets: 291 10*3/uL (ref 150–400)
RBC: 4.05 MIL/uL (ref 3.87–5.11)
RBC: 4.1 MIL/uL (ref 3.87–5.11)
RDW: 15 % (ref 11.5–15.5)
RDW: 15.1 % (ref 11.5–15.5)
WBC: 10.2 10*3/uL (ref 4.0–10.5)
WBC: 14.3 10*3/uL — ABNORMAL HIGH (ref 4.0–10.5)
nRBC: 0 % (ref 0.0–0.2)
nRBC: 0 % (ref 0.0–0.2)

## 2020-04-17 LAB — COMPREHENSIVE METABOLIC PANEL
ALT: 12 U/L (ref 0–44)
ALT: 13 U/L (ref 0–44)
AST: 19 U/L (ref 15–41)
AST: 19 U/L (ref 15–41)
Albumin: 2.4 g/dL — ABNORMAL LOW (ref 3.5–5.0)
Albumin: 2.7 g/dL — ABNORMAL LOW (ref 3.5–5.0)
Alkaline Phosphatase: 35 U/L — ABNORMAL LOW (ref 38–126)
Alkaline Phosphatase: 40 U/L (ref 38–126)
Anion gap: 11 (ref 5–15)
Anion gap: 9 (ref 5–15)
BUN: 6 mg/dL — ABNORMAL LOW (ref 8–23)
BUN: 8 mg/dL (ref 8–23)
CO2: 20 mmol/L — ABNORMAL LOW (ref 22–32)
CO2: 21 mmol/L — ABNORMAL LOW (ref 22–32)
Calcium: 7.4 mg/dL — ABNORMAL LOW (ref 8.9–10.3)
Calcium: 7.8 mg/dL — ABNORMAL LOW (ref 8.9–10.3)
Chloride: 105 mmol/L (ref 98–111)
Chloride: 108 mmol/L (ref 98–111)
Creatinine, Ser: 0.54 mg/dL (ref 0.44–1.00)
Creatinine, Ser: 0.78 mg/dL (ref 0.44–1.00)
GFR, Estimated: >60 mL/min (ref >60)
GFR, Estimated: >60 mL/min (ref >60)
Glucose, Bld: 95 mg/dL (ref 70–99)
Glucose, Bld: 97 mg/dL (ref 70–99)
Potassium: 2.9 mmol/L — ABNORMAL LOW (ref 3.5–5.1)
Potassium: 3.7 mmol/L (ref 3.5–5.1)
Sodium: 137 mmol/L (ref 135–145)
Sodium: 137 mmol/L (ref 135–145)
Total Bilirubin: 1.4 mg/dL — ABNORMAL HIGH (ref 0.3–1.2)
Total Bilirubin: 1.8 mg/dL — ABNORMAL HIGH (ref 0.3–1.2)
Total Protein: 4.9 g/dL — ABNORMAL LOW (ref 6.5–8.1)
Total Protein: 5.2 g/dL — ABNORMAL LOW (ref 6.5–8.1)

## 2020-04-17 LAB — CBG MONITORING, ED
Glucose-Capillary: 101 mg/dL — ABNORMAL HIGH (ref 70–99)
Glucose-Capillary: 186 mg/dL — ABNORMAL HIGH (ref 70–99)
Glucose-Capillary: 87 mg/dL (ref 70–99)
Glucose-Capillary: 95 mg/dL (ref 70–99)
Glucose-Capillary: 96 mg/dL (ref 70–99)

## 2020-04-17 LAB — HEMOGLOBIN A1C
Hgb A1c MFr Bld: 5.6 % (ref 4.8–5.6)
Mean Plasma Glucose: 114.02 mg/dL

## 2020-04-17 LAB — POC SARS CORONAVIRUS 2 AG -  ED: SARS Coronavirus 2 Ag: POSITIVE — AB

## 2020-04-17 LAB — APTT: aPTT: 25 seconds (ref 24–36)

## 2020-04-17 LAB — BRAIN NATRIURETIC PEPTIDE: B Natriuretic Peptide: 82.2 pg/mL (ref 0.0–100.0)

## 2020-04-17 LAB — C-REACTIVE PROTEIN: CRP: 16.6 mg/dL — ABNORMAL HIGH (ref <1.0)

## 2020-04-17 LAB — PROTIME-INR
INR: 1.2 (ref 0.8–1.2)
Prothrombin Time: 14.5 seconds (ref 11.4–15.2)

## 2020-04-17 LAB — STREP PNEUMONIAE URINARY ANTIGEN: Strep Pneumo Urinary Antigen: NEGATIVE

## 2020-04-17 LAB — MAGNESIUM: Magnesium: 1.6 mg/dL — ABNORMAL LOW (ref 1.7–2.4)

## 2020-04-17 LAB — LACTIC ACID, PLASMA: Lactic Acid, Venous: 1.2 mmol/L (ref 0.5–1.9)

## 2020-04-17 LAB — PROCALCITONIN: Procalcitonin: <0.1 ng/mL

## 2020-04-17 LAB — D-DIMER, QUANTITATIVE: D-Dimer, Quant: 1.95 ug/mL-FEU — ABNORMAL HIGH (ref 0.00–0.50)

## 2020-04-17 MED ORDER — ENOXAPARIN SODIUM 40 MG/0.4ML ~~LOC~~ SOLN
40.0000 mg | Freq: Every day | SUBCUTANEOUS | Status: DC
  Administered 2020-04-17 – 2020-04-21 (×5): 40 mg via SUBCUTANEOUS
  Filled 2020-04-17 (×5): qty 0.4

## 2020-04-17 MED ORDER — DEXTROSE 50 % IV SOLN
INTRAVENOUS | Status: AC
  Administered 2020-04-17: 50 mL
  Filled 2020-04-17: qty 50

## 2020-04-17 MED ORDER — POTASSIUM CHLORIDE IN NACL 40-0.9 MEQ/L-% IV SOLN
INTRAVENOUS | Status: DC
  Filled 2020-04-17: qty 1000

## 2020-04-17 MED ORDER — SODIUM CHLORIDE 0.9 % IV SOLN
500.0000 mg | Freq: Every day | INTRAVENOUS | Status: DC
  Administered 2020-04-17 (×2): 500 mg via INTRAVENOUS
  Filled 2020-04-17 (×3): qty 500

## 2020-04-17 MED ORDER — ONDANSETRON HCL 4 MG PO TABS: 4.0000 mg | ORAL_TABLET | Freq: Four times a day (QID) | ORAL | Status: DC | PRN

## 2020-04-17 MED ORDER — SODIUM CHLORIDE 0.9 % IV SOLN
2.0000 g | Freq: Every day | INTRAVENOUS | Status: DC
  Administered 2020-04-17 – 2020-04-18 (×3): 2 g via INTRAVENOUS
  Filled 2020-04-17 (×3): qty 20

## 2020-04-17 MED ORDER — MIRTAZAPINE 15 MG PO TABS
7.5000 mg | ORAL_TABLET | Freq: Every day | ORAL | Status: DC
  Administered 2020-04-18 – 2020-04-20 (×3): 7.5 mg via ORAL
  Filled 2020-04-17 (×5): qty 1

## 2020-04-17 MED ORDER — VITAMIN B-12 1000 MCG PO TABS
1000.0000 ug | ORAL_TABLET | Freq: Every day | ORAL | Status: DC
  Administered 2020-04-18 – 2020-04-21 (×4): 1000 ug via ORAL
  Filled 2020-04-17 (×4): qty 1

## 2020-04-17 MED ORDER — ONDANSETRON HCL 4 MG/2ML IJ SOLN
4.0000 mg | Freq: Four times a day (QID) | INTRAMUSCULAR | Status: DC | PRN
  Administered 2020-04-19: 4 mg via INTRAVENOUS
  Filled 2020-04-17: qty 2

## 2020-04-17 MED ORDER — POTASSIUM CHLORIDE IN NACL 20-0.9 MEQ/L-% IV SOLN
INTRAVENOUS | Status: AC
  Filled 2020-04-17 (×2): qty 1000

## 2020-04-17 MED ORDER — ENSURE ENLIVE PO LIQD: 237.0000 mL | Freq: Two times a day (BID) | ORAL | Status: DC

## 2020-04-17 MED ORDER — LACTATED RINGERS IV BOLUS
1000.0000 mL | Freq: Once | INTRAVENOUS | Status: AC
  Administered 2020-04-17: 1000 mL via INTRAVENOUS

## 2020-04-17 MED ORDER — POTASSIUM CHLORIDE 10 MEQ/100ML IV SOLN
10.0000 meq | Freq: Once | INTRAVENOUS | Status: AC
  Administered 2020-04-17: 10 meq via INTRAVENOUS
  Filled 2020-04-17: qty 100

## 2020-04-17 MED ORDER — RISPERIDONE 0.5 MG PO TABS
3.0000 mg | ORAL_TABLET | Freq: Every day | ORAL | Status: DC
  Filled 2020-04-17: qty 1
  Filled 2020-04-17: qty 6

## 2020-04-17 MED ORDER — SENNOSIDES-DOCUSATE SODIUM 8.6-50 MG PO TABS: 1.0000 | ORAL_TABLET | Freq: Every evening | ORAL | Status: DC | PRN

## 2020-04-17 MED ORDER — ALPRAZOLAM 0.5 MG PO TABS: 0.5000 mg | ORAL_TABLET | Freq: Every evening | ORAL | Status: DC | PRN

## 2020-04-17 MED ORDER — ACETAMINOPHEN 325 MG PO TABS: 650.0000 mg | ORAL_TABLET | Freq: Four times a day (QID) | ORAL | Status: DC | PRN

## 2020-04-17 MED ORDER — SODIUM CHLORIDE 0.9 % IV SOLN
1.0000 g | Freq: Once | INTRAVENOUS | Status: DC
  Filled 2020-04-17: qty 10

## 2020-04-17 MED ORDER — ESCITALOPRAM OXALATE 10 MG PO TABS
5.0000 mg | ORAL_TABLET | Freq: Every day | ORAL | Status: DC
  Administered 2020-04-18 – 2020-04-20 (×2): 5 mg via ORAL
  Filled 2020-04-17 (×3): qty 1

## 2020-04-17 MED ORDER — SODIUM CHLORIDE 0.9 % IV SOLN
200.0000 mg | Freq: Once | INTRAVENOUS | Status: AC
  Administered 2020-04-17: 200 mg via INTRAVENOUS
  Filled 2020-04-17: qty 200

## 2020-04-17 MED ORDER — MAGNESIUM SULFATE 2 GM/50ML IV SOLN
2.0000 g | Freq: Once | INTRAVENOUS | Status: AC
  Administered 2020-04-17: 2 g via INTRAVENOUS
  Filled 2020-04-17: qty 50

## 2020-04-17 MED ORDER — MAGNESIUM SULFATE IN D5W 1-5 GM/100ML-% IV SOLN
1.0000 g | Freq: Once | INTRAVENOUS | Status: AC
  Administered 2020-04-17: 1 g via INTRAVENOUS
  Filled 2020-04-17: qty 100

## 2020-04-17 MED ORDER — SODIUM CHLORIDE 0.9 % IV SOLN: 1.0000 g | INTRAVENOUS | Status: DC

## 2020-04-17 MED ORDER — SODIUM CHLORIDE 0.9 % IV SOLN
100.0000 mg | Freq: Every day | INTRAVENOUS | Status: AC
  Administered 2020-04-17 – 2020-04-20 (×4): 100 mg via INTRAVENOUS
  Filled 2020-04-17 (×4): qty 20

## 2020-04-17 NOTE — ED Notes (Addendum)
Daughter -- [name] -- called-- discussed pt's possiblity of depression-- husband committed suicide 5 years ago (he was an employee here at [employer]) - requesting psyc eval after medically clear--- [name] states that she handles all medical/financial issues for her mom-- is working on [employer]  [phone] Please contact for updates and questions

## 2020-04-17 NOTE — ED Notes (Signed)
Discussed visitation with daughter--  There is a daughter that is local-- [name] -- that may be able to come--- due to depression and altered mental status

## 2020-04-17 NOTE — Evaluation (Signed)
Physical Therapy Evaluation Patient Details Name: [name] MRN: [id number] DOB: 07/18/1955 Today's Date: 04/17/2020   History of Present Illness  Pt is a 65 y.o. F with significant PMH of depression, anxiety, physical deconditioning, bipolar disorder, back/neck surgeries, and pacemaker placement who presents from SNF with increased lethargy and sepsis secondary to pneumonia in setting of COVID-19. Pt testing positive for COVID-19 one week ago.  Clinical Impression  Prior to admission, pt at SNF and ambulatory with a RW. Pt presents with a gross change from her baseline with noted poor sitting balance, cognitive deficits, and generalized weakness. Pt keeping eyes closed throughout majority of session and following one step commands inconsistently. Pt requiring max-total assist for bed mobility and is unable to sit on the edge of bed without significant external support. SpO2 92-95% on RA, 95 HR, BP 113/69. Recommend return to SNF upon discharge.     Follow Up Recommendations SNF    Equipment Recommendations  None recommended by PT    Recommendations for Other Services       Precautions / Restrictions Precautions Precautions: Fall Restrictions Weight Bearing Restrictions: No      Mobility  Bed Mobility Overal bed mobility: Needs Assistance Bed Mobility: Supine to Sit;Sit to Supine     Supine to sit: Total assist Sit to supine: Max assist   General bed mobility comments: TotalA for supine > sit, no initiation noted by pt. MaxA for return to supine with guidance for trunk and assist elevating BLE's back onto stretcher    Transfers                 General transfer comment: deferred due to poor sitting balance  Ambulation/Gait                Stairs            Wheelchair Mobility    Modified Rankin (Stroke Patients Only)       Balance Overall balance assessment: Needs assistance Sitting-balance support: Feet unsupported;No upper extremity  supported Sitting balance-Leahy Scale: Poor Sitting balance - Comments: Requiring maxA due to posterior lean Postural control: Posterior lean                                   Pertinent Vitals/Pain Pain Assessment: Faces Faces Pain Scale: No hurt    Home Living Family/patient expects to be discharged to:: Skilled nursing facility                      Prior Function Level of Independence: Needs assistance   Gait / Transfers Assistance Needed: Using RW  ADL's / Homemaking Assistance Needed: Pt reports independent ADL's (questionable historian)        Hand Dominance   Dominant Hand: Right    Extremity/Trunk Assessment   Upper Extremity Assessment Upper Extremity Assessment: Generalized weakness    Lower Extremity Assessment Lower Extremity Assessment: Generalized weakness    Cervical / Trunk Assessment Cervical / Trunk Assessment: Kyphotic  Communication   Communication: No difficulties  Cognition Arousal/Alertness: Awake/alert Behavior During Therapy: Flat affect Overall Cognitive Status: Impaired/Different from baseline Area of Impairment: Orientation;Following commands;Problem solving                 Orientation Level: Disoriented to;Place;Situation     Following Commands: Follows one step commands inconsistently     Problem Solving: Slow processing General Comments: Pt keeping eyes closed through majority  of session with head positioning resting in left rotation and lateral flexion. Pt not oriented to place or situation, or could not tell me the name of the SNF she came from. Follows 1 step commands inconsistently      General Comments      Exercises General Exercises - Lower Extremity Long Arc Quad: Both;5 reps;Seated   Assessment/Plan    PT Assessment Patient needs continued PT services  PT Problem List Decreased strength;Decreased balance;Decreased mobility;Decreased activity tolerance;Decreased cognition;Decreased  knowledge of use of DME       PT Treatment Interventions DME instruction;Gait training;Functional mobility training;Stair training;Therapeutic activities;Therapeutic exercise;Balance training;Patient/family education    PT Goals (Current goals can be found in the Care Plan section)  Acute Rehab PT Goals Patient Stated Goal: did not state PT Goal Formulation: With patient Time For Goal Achievement: 05/01/20 Potential to Achieve Goals: Fair    Frequency Min 2X/week   Barriers to discharge        Co-evaluation               AM-PAC PT "6 Clicks" Mobility  Outcome Measure Help needed turning from your back to your side while in a flat bed without using bedrails?: Total Help needed moving from lying on your back to sitting on the side of a flat bed without using bedrails?: Total Help needed moving to and from a bed to a chair (including a wheelchair)?: Total Help needed standing up from a chair using your arms (e.g., wheelchair or bedside chair)?: Total Help needed to walk in hospital room?: Total Help needed climbing 3-5 steps with a railing? : Total 6 Click Score: 6    End of Session   Activity Tolerance: Patient limited by fatigue Patient left: in bed;with call bell/phone within reach Nurse Communication: Mobility status PT Visit Diagnosis: Muscle weakness (generalized) (M62.81);Other abnormalities of gait and mobility (R26.89);History of falling (Z91.81)    Time: 2831-5176 PT Time Calculation (min) (ACUTE ONLY): 23 min   Charges:   PT Evaluation $PT Eval Moderate Complexity: 1 Mod PT Treatments $Therapeutic Activity: 8-22 mins        Wyona Almas, PT, DPT Acute Rehabilitation Services Pager [phone] Office [phone]   Deno Etienne 04/17/2020, 12:08 PM

## 2020-04-17 NOTE — ED Notes (Signed)
Pt has moist cough. Unable to expectorate any phlegm

## 2020-04-17 NOTE — ED Notes (Signed)
Tele Breakfast order placed 

## 2020-04-17 NOTE — Plan of Care (Signed)
  Problem: Education: Goal: Knowledge of General Education information will improve Description: Including pain rating scale, medication(s)/side effects and non-pharmacologic comfort measures Outcome: Progressing   Problem: Clinical Measurements: Goal: Will remain free from infection Outcome: Progressing   Problem: Education: Goal: Knowledge of risk factors and measures for prevention of condition will improve Outcome: Progressing   Problem: Respiratory: Goal: Will maintain a patent airway Outcome: Progressing   Problem: Health Behavior/Discharge Planning: Goal: Ability to manage health-related needs will improve Outcome: Not Progressing   Problem: Activity: Goal: Risk for activity intolerance will decrease Outcome: Not Progressing   Problem: Nutrition: Goal: Adequate nutrition will be maintained Outcome: Not Progressing

## 2020-04-17 NOTE — H&P (Signed)
History and Physical    SAMMI STOLARZ JSH:[id number] DOB: 02/21/1956 DOA: 04/16/2020  PCP: Lujean Amel, MD   Patient coming from: SNF   Chief Complaint: Lethargy, not eating or drinking, COVID+   HPI: ARONDA BURFORD is a 65 y.o. female with medical history significant for depression, anxiety, physical deconditioning, B12 deficiency, and reportedly positive for Covid about a week ago, now presenting from her SNF for evaluation of worsening lethargy for oral intake.  Patient reportedly tested positive for COVID-19 about 1 week ago, has been more lethargic at her SNF, not eating or drinking much, and it was suspected that she may have a UTI but she had not been started on treatment for that.  Patient reports cough, shortness of breath, and loss of appetite, but denies dysuria, suprapubic or flank pain, fevers, chills, chest pain, abdominal pain, vomiting, or diarrhea.  ED Course: Upon arrival to the ED, patient is found to be afebrile, saturating mid 90s on room air, tachypneic, tachycardic in the 110s, and with stable blood pressure.  EKG features a sinus tachycardia.  Chest x-ray is notable for trace left pleural effusion and hazy lung opacities.  Chemistry panel features a potassium of 2.9 and albumin 2.7.  CBC notable for leukocytosis to 11,300 and a mild normocytic anemia.  COVID-19 antigens positive.  Urinalysis compatible with possible infection.  Patient was given a liter of LR, 1 g IV Rocephin, 10 mEq IV potassium, and remdesivir in the ED.  Review of Systems:  All other systems reviewed and apart from HPI, are negative.  Past Medical History:  Diagnosis Date  . Anxiety    anxiety and mild depressive order systoms  . Bipolar disorder ([place])     (02/20/2013)  . Cervical cancer ([place]) 03/19/1980  . Depression   . Dysrhythmia   . IFOYDXAJ(287.8)    "monthly" (02/20/2013)  . Pacemaker    medtronic  . PONV (postoperative nausea and vomiting)   . Sinus pause 02/14/2013  . Syncope and  collapse    echo-April 12,2012-EF 55% Echo normal; recorder with prolonged sinus pauses --> status post  . Tobacco abuse   . Vertigo     Past Surgical History:  Procedure Laterality Date  . ABDOMINAL HYSTERECTOMY  03/19/1980   "partial"  . BACK SURGERY    . CERVICAL FUSION Left 11/2013   C4-C6  . CERVICAL FUSION     3,4,5,  . INSERT / REPLACE / REMOVE PACEMAKER  02/20/2013   medtronic  . LOOP RECORDER IMPLANT N/A 11/06/2012   Procedure: LINQ LOOP RECORDER IMPLANT;  Surgeon: Sanda Klein, MD;  Location: [facility];  Service: Cardiovascular;  Laterality: N/A;  . NECK SURGERY  11/17/2013  . PACEMAKER PLACEMENT N/A 02/16/2013  . PERMANENT PACEMAKER INSERTION N/A 02/20/2013   Procedure: PERMANENT PACEMAKER INSERTION;  Surgeon: Sanda Klein, MD;  Location: [facility];  Service: Cardiovascular;  Laterality: N/A;  . POSTERIOR LUMBAR FUSION  ~ 2009  . ROBOTIC ASSISTED SALPINGO OOPHERECTOMY Bilateral 11/25/2018   Procedure: XI ROBOTIC ASSISTED BILATERAL SALPINGO OOPHORECTOMY and LYSIS OF ADHESIONS.;  Surgeon: Everitt Amber, MD;  Location: [facility];  Service: Gynecology;  Laterality: Bilateral;  . SPINAL FUSION  03/19/2014  . TONSILLECTOMY    . TRANSTHORACIC ECHOCARDIOGRAM  07/11/2010   The left atrial size is normal.There is no evidence of mitral vavle prolaspe.Right ventriicular systolic pressure is normal . Injection of contrast documented no interatrial shunt. Essentially normal 2D echo -doppler study     Social History:  reports that she has been smoking cigarettes. She has a 20.00 pack-year smoking history. She has never used smokeless tobacco. She reports that she does not drink alcohol and does not use drugs.  Allergies  Allergen Reactions  . Penicillins Other (See Comments)    Yeast infection Did it involve swelling of the face/tongue/throat, SOB, or low BP? No Did it involve sudden or severe rash/hives, skin peeling, or any reaction on the inside of your mouth or nose? No Did you  need to seek medical attention at a hospital or doctor's office? Yes When did it last happen? More than 5 years ago If all above answers are "NO", may proceed with cephalosporin use.     Family History  Adopted: Yes  Problem Relation Age of Onset  . Heart attack Brother      Prior to Admission medications   Medication Sig Start Date End Date Taking? Authorizing Provider  acetaminophen (TYLENOL) 325 MG tablet Take 2 tablets (650 mg total) by mouth every 6 (six) hours as needed for mild pain (or Fever >/= 101). 02/26/20   Domenic Polite, MD  ALPRAZolam Duanne Moron) 1 MG tablet 1 po bid prn and occas extra 1 po mid-day. Patient taking differently: Take 1 mg by mouth 2 (two) times daily as needed for anxiety. 03/17/20   Donnal Moat T, PA-C  cyclobenzaprine (FLEXERIL) 10 MG tablet Take 10 mg by mouth at bedtime as needed for muscle spasms. 12/30/19   [provider]  risperiDONE (RISPERDAL) 3 MG tablet Take 1 tablet (3 mg total) by mouth at bedtime. 01/29/20   Donnal Moat T, PA-C  vitamin B-12 1000 MCG tablet Take 1 tablet (1,000 mcg total) by mouth daily. 02/27/20   Domenic Polite, MD    Physical Exam: Vitals:   04/17/20 0030 04/17/20 0045 04/17/20 0100 04/17/20 0115  BP: 125/69 117/68 130/76 128/72  Pulse: (!) 109 (!) 115 (!) 113 (!) 114  Resp: (!) 32 (!) 29 (!) 25 (!) 32  Temp:      TempSrc:      SpO2: 98% 96% 95% 95%  Weight:      Height:        Constitutional: NAD, calm  Eyes: PERTLA, lids and conjunctivae normal ENMT: Mucous membranes are dry. Posterior pharynx clear of any exudate or lesions.   Neck: normal, supple, no masses, no thyromegaly Respiratory: Frequent cough, rhonchi, no wheezing. Tachypnea, no pallor or cyanosis.   Cardiovascular: Rate ~120 and regular. No extremity edema.  Abdomen: No distension, no tenderness, soft. Bowel sounds active.  Musculoskeletal: no clubbing / cyanosis. No joint deformity upper and lower extremities.   Skin: no  significant rashes, lesions, ulcers. Warm, dry, well-perfused. Neurologic: CN 2-12 grossly intact. Sensation intact. Moving all extremities.  Psychiatric: Lethargic, oriented to person and place. Calm and cooperative.    Labs and Imaging on Admission: I have personally reviewed following labs and imaging studies  CBC: Recent Labs  Lab 04/16/20 0003  WBC 14.3*  NEUTROABS 12.1*  HGB 11.9*  HCT 35.7*  MCV 87.1  PLT 99991111   Basic Metabolic Panel: Recent Labs  Lab 04/16/20 0003  NA 137  K 2.9*  CL 105  CO2 21*  GLUCOSE 95  BUN 8  CREATININE 0.78  CALCIUM 7.8*   GFR: Estimated Creatinine Clearance: 61.1 mL/min (by C-G formula based on SCr of 0.78 mg/dL). Liver Function Tests: Recent Labs  Lab 04/16/20 0003  AST 19  ALT 13  ALKPHOS 40  BILITOT 1.8*  PROT  5.2*  ALBUMIN 2.7*   No results for input(s): LIPASE, AMYLASE in the last 168 hours. No results for input(s): AMMONIA in the last 168 hours. Coagulation Profile: No results for input(s): INR, PROTIME in the last 168 hours. Cardiac Enzymes: No results for input(s): CKTOTAL, CKMB, CKMBINDEX, TROPONINI in the last 168 hours. BNP (last 3 results) No results for input(s): PROBNP in the last 8760 hours. HbA1C: No results for input(s): HGBA1C in the last 72 hours. CBG: Recent Labs  Lab 04/16/20 2357 04/17/20 0040  GLUCAP 47* 186*   Lipid Profile: No results for input(s): CHOL, HDL, LDLCALC, TRIG, CHOLHDL, LDLDIRECT in the last 72 hours. Thyroid Function Tests: No results for input(s): TSH, T4TOTAL, FREET4, T3FREE, THYROIDAB in the last 72 hours. Anemia Panel: No results for input(s): VITAMINB12, FOLATE, FERRITIN, TIBC, IRON, RETICCTPCT in the last 72 hours. Urine analysis:    Component Value Date/Time   COLORURINE YELLOW 04/17/2020 0045   APPEARANCEUR CLOUDY (A) 04/17/2020 0045   LABSPEC 1.012 04/17/2020 0045   PHURINE 8.0 04/17/2020 0045   GLUCOSEU >=500 (A) 04/17/2020 0045   HGBUR NEGATIVE 04/17/2020 0045    BILIRUBINUR NEGATIVE 04/17/2020 0045   KETONESUR 20 (A) 04/17/2020 0045   PROTEINUR 100 (A) 04/17/2020 0045   NITRITE NEGATIVE 04/17/2020 0045   LEUKOCYTESUR LARGE (A) 04/17/2020 0045   Sepsis Labs: @LABRCNTIP (procalcitonin:4,lacticidven:4) )No results found for this or any previous visit (from the past 240 hour(s)).   Radiological Exams on Admission: DG Chest Port 1 View  Result Date: 04/17/2020 CLINICAL DATA:  Shortness of breath, COVID positive. EXAM: PORTABLE CHEST 1 VIEW.  Patient is rotated. COMPARISON:  Chest x-ray 03/22/2020 FINDINGS: Left chest wall 2 lead cardiac pacemaker in stable position. The heart size and mediastinal contours are unchanged. Aortic arch calcification. Biapical pleural/pulmonary scarring. Lower to mid lung zone vague hazy airspace opacity. No pulmonary edema. Interval development of a trace left pleural effusion. No pneumothorax. No acute osseous abnormality. Partially visualized cervical spine hardware. IMPRESSION: 1. Interval development of a trace left pleural effusion with underlying infection/inflammation not excluded. 2. Lower to mid lung zone vague hazy airspace opacity that could represent atelectasis versus infection/inflammation. Also limited evaluation due to patient rotation. 3. Followup PA and lateral chest X-ray is recommended in 3-4 weeks following therapy to ensure resolution and exclude underlying malignancy. Electronically Signed   By: Iven Finn M.D.   On: 04/17/2020 00:17    EKG: Independently reviewed. Sinus tachycardia, rate 109.   Assessment/Plan   1. Sepsis secondary to pneumonia; COVID-19  - Presents from SNF with lethargy and poor oral intake, is reported to have tested positive for COVID ~1 wk ago and was suspected to have a UTI but not started on treatment  - She is found to be tachycardic and tachypneic with leukocytosis to 14,300  - She denies dysuria, suprapubic pain, or flank pain but acknowledges cough and SOB  - There is  a trace left pleural effusion and hazy opacities on CXR  - Plan to culture blood and urine, check procalcitonin and lactic acid, check strep pneumo and legionella antigens, continue remdesivir, continue Rocephin, add azithromycin, trend markers, start systemic steroid if she develops hypoxia   2. Hypokalemia  - Serum potassium 2.9 in ED  - Replace, repeat chem panel in am    3. Hypoglycemia  - Serum glucose was normal in ED and she had a CBG of 47 that was taken at about the same time and may have been spurious  - Continue CBG  checks for now and treat as needed   4. Deconditioning  - Continue PT    5. B12 deficiency  - Continue B12 supplementation    6. Depression, anxiety  - Follow-up pharmacy medication-reconciliation, hold sedating medications initially     DVT prophylaxis: Lovenox  Code Status: Full  Level of Care: Level of care: Telemetry Medical Family Communication:  Disposition Plan:  Patient is from: SNF  Anticipated d/c is to: SNF  Anticipated d/c date is: 04/20/20 Patient currently: Pending further assessment and treatment of sepsis suspected secondary to PNA, improved PO intake   Consults called: None  Admission status: Inpatient     Vianne Bulls, MD [facility]  04/17/2020, 2:36 AM

## 2020-04-17 NOTE — Progress Notes (Signed)
PROGRESS NOTE                                                                             PROGRESS NOTE                                                                                                                                                                                                             Patient Demographics:    [name], is a 65 y.o. female, DOB - September 10, 1955, [id number]  Outpatient Primary MD for the patient is Koirala, Dibas, MD    LOS - 0  Admit date - 04/16/2020    Chief Complaint  Patient presents with  . Covid Positive       Brief Narrative    Is a no charge note as patient was seen and admitted earlier today, chart, imaging and labs were reviewed, patient was seen and examined.   HPI: [name] is a 65 y.o. female with medical history significant for depression, anxiety, physical deconditioning, B12 deficiency, and reportedly positive for Covid about a week ago, now presenting from her SNF for evaluation of worsening lethargy for oral intake.  Patient reportedly tested positive for COVID-19 about 1 week ago, has been more lethargic at her SNF, not eating or drinking much, and it was suspected that she may have a UTI but she had not been started on treatment for that.  Patient reports cough, shortness of breath, and loss of appetite, but denies dysuria, suprapubic or flank pain, fevers, chills, chest pain, abdominal pain, vomiting, or diarrhea.  ED Course: Upon arrival to the ED, patient is found to be afebrile, saturating mid 90s on room air, tachypneic, tachycardic in the 110s, and with stable blood pressure.  EKG features a sinus tachycardia.  Chest x-ray is notable for trace left pleural effusion and hazy lung opacities.  Chemistry panel features a potassium of 2.9 and albumin 2.7.  CBC notable for leukocytosis to 11,300 and a mild normocytic anemia.  COVID-19 antigens positive.  Urinalysis compatible with  possible infection.  Patient was given a liter of LR, 1 g IV Rocephin, 10 mEq IV potassium, and remdesivir in the ED.   Subjective:    [name] today is any chest pain, reports generalized weakness, fatigue and poor appetite .    Assessment  & Plan :    Principal Problem:   Sepsis due to pneumonia [facility]) Active Problems:   Depression with anxiety   B12 deficiency   Hypokalemia   COVID-19 virus infection   Physical deconditioning   Hypoglycemia without diagnosis of diabetes mellitus    COVID-19  infection -Patient reports she is vaccinated, but she does not recall if she received a booster - Presents from SNF with lethargy and poor oral intake, is reported to have tested positive for COVID ~1 wk ago and was suspected to have a UTI but not started on treatment  - She is found to be tachycardic and tachypneic with leukocytosis to 14,300  - She denies dysuria, suprapubic pain, or flank pain but acknowledges cough and SOB  - There is a trace left pleural effusion and hazy opacities on CXR  -Continue with remdesivir - Plan to culture blood and urine, check procalcitonin and lactic acid, check strep pneumo and legionella antigens,  continue Rocephin, add azithromycin, trend markers, start systemic steroid if she develops hypoxia    Hypokalemia  - Repleted    Hypoglycemia  - Serum glucose was normal in ED and she had a CBG of 47 that was taken at about the same time and may have been spurious   Deconditioning  - Continue PT    B12 deficiency  - Continue B12 supplementation    Depression, anxiety  - Resume Xanax at a lower dose, continue with Risperdal, will start on Lexapro.   SpO2: 97 %  Recent Labs  Lab 04/16/20 0003 04/17/20 0259 04/17/20 1135  WBC 14.3*  --  10.2  PLT 263  --  291  CRP  --  16.6*  --   BNP  --  82.2  --   DDIMER  --  1.95*  --   PROCALCITON  --  <0.10  --   AST 19  --  19  ALT 13  --  12  ALKPHOS 40  --  35*  BILITOT 1.8*  --  1.4*   ALBUMIN 2.7*  --  2.4*  INR  --  1.2  --   LATICACIDVEN  --  1.2  --        ABG     Component Value Date/Time   HCO3 24.3 03/22/2020 1910   TCO2 26 03/22/2020 1910   ACIDBASEDEF 2.0 03/22/2020 1910   O2SAT 54.0 03/22/2020 1910        Condition - Extremely Guarded  Code Status :  full  Consults  :  none  Procedures  :  none   Status is: Inpatient  Remains inpatient appropriate because:IV treatments appropriate due to intensity of illness or inability to take PO   Dispo: The patient is from: SNF              Anticipated d/c is to: SNF              Anticipated d/c date is: 2 days              Patient currently is not medically stable to d/c.   Difficult to place patient No      DVT Prophylaxis  :  Lovenox   Lab  Results  Component Value Date   PLT 291 04/17/2020    Diet :  Diet Order            Diet Heart Room service appropriate? Yes; Fluid consistency: Thin  Diet effective now                  Inpatient Medications  Scheduled Meds: . enoxaparin (LOVENOX) injection  40 mg Subcutaneous Daily  . cyanocobalamin  1,000 mcg Oral Daily   Continuous Infusions: . azithromycin Stopped (04/17/20 0554)  . cefTRIAXone (ROCEPHIN)  IV Stopped (04/17/20 0519)  . [START ON 04/18/2020] remdesivir 100 mg in NS 100 mL 100 mg (04/17/20 1451)   PRN Meds:.acetaminophen, ondansetron **OR** ondansetron (ZOFRAN) IV, senna-docusate  Antibiotics  :    Anti-infectives (From admission, onward)   Start     Dose/Rate Route Frequency Ordered Stop   04/18/20 1000  remdesivir 100 mg in sodium chloride 0.9 % 100 mL IVPB       "Followed by" Linked Group Details   100 mg 200 mL/hr over 30 Minutes Intravenous Daily 04/17/20 0027 04/22/20 0959   04/17/20 2200  cefTRIAXone (ROCEPHIN) 1 g in sodium chloride 0.9 % 100 mL IVPB  Status:  Discontinued        1 g 200 mL/hr over 30 Minutes Intravenous Every 24 hours 04/17/20 0217 04/17/20 0234   04/17/20 0245  cefTRIAXone  (ROCEPHIN) 2 g in sodium chloride 0.9 % 100 mL IVPB        2 g 200 mL/hr over 30 Minutes Intravenous Daily at bedtime 04/17/20 0235 04/21/20 2159   04/17/20 0245  azithromycin (ZITHROMAX) 500 mg in sodium chloride 0.9 % 250 mL IVPB        500 mg 250 mL/hr over 60 Minutes Intravenous Daily at bedtime 04/17/20 0235 04/21/20 2159   04/17/20 0200  remdesivir 200 mg in sodium chloride 0.9% 250 mL IVPB       "Followed by" Linked Group Details   200 mg 580 mL/hr over 30 Minutes Intravenous Once 04/17/20 0027 04/17/20 0424   04/17/20 0115  cefTRIAXone (ROCEPHIN) 1 g in sodium chloride 0.9 % 100 mL IVPB  Status:  Discontinued        1 g 200 mL/hr over 30 Minutes Intravenous  Once 04/17/20 0109 04/17/20 0236     Maahir Horst M.D on 04/17/2020 at 4:25 PM  To page go to [email or web address]  [facility] -  Office  [phone]      Objective:   Vitals:   04/17/20 1300 04/17/20 1330 04/17/20 1345 04/17/20 1415  BP: 129/72 136/73 136/70 (!) 143/80  Pulse: 99 (!) 104 (!) 102 (!) 107  Resp: (!) 26 (!) 34 (!) 31 (!) 32  Temp:      TempSrc:      SpO2: 96% 95% 97% 97%  Weight:      Height:        Wt Readings from Last 3 Encounters:  04/16/20 53.1 kg  02/25/20 53.9 kg  02/08/20 59 kg     Intake/Output Summary (Last 24 hours) at 04/17/2020 1625 Last data filed at 04/17/2020 1120 Gross per 24 hour  Intake 1000 ml  Output -  Net 1000 ml     Physical Exam  Awake Alert, extremely frail, deconditioned. Symmetrical Chest wall movement, Good air movement bilaterally, CTAB RRR,No Gallops,Rubs or new Murmurs, No Parasternal Heave +ve B.Sounds, Abd Soft, No tenderness,, No rebound - guarding or rigidity. No Cyanosis, Clubbing or edema, No new Rash or  bruise      Data Review:    CBC Recent Labs  Lab 04/16/20 0003 04/17/20 1135  WBC 14.3* 10.2  HGB 11.9* 11.4*  HCT 35.7* 35.4*  PLT 263 291  MCV 87.1 87.4  MCH 29.0 28.1  MCHC 33.3 32.2  RDW 15.0 15.1  LYMPHSABS 1.2 1.0   MONOABS 0.9 0.8  EOSABS 0.0 0.0  BASOSABS 0.0 0.0    Recent Labs  Lab 04/16/20 0003 04/17/20 0259 04/17/20 1135  NA 137  --  137  K 2.9*  --  3.7  CL 105  --  108  CO2 21*  --  20*  GLUCOSE 95  --  97  BUN 8  --  6*  CREATININE 0.78  --  0.54  CALCIUM 7.8*  --  7.4*  AST 19  --  19  ALT 13  --  12  ALKPHOS 40  --  35*  BILITOT 1.8*  --  1.4*  ALBUMIN 2.7*  --  2.4*  MG  --  1.6*  --   CRP  --  16.6*  --   DDIMER  --  1.95*  --   PROCALCITON  --  <0.10  --   LATICACIDVEN  --  1.2  --   INR  --  1.2  --   HGBA1C  --  5.6  --   BNP  --  82.2  --     ------------------------------------------------------------------------------------------------------------------ No results for input(s): CHOL, HDL, LDLCALC, TRIG, CHOLHDL, LDLDIRECT in the last 72 hours.  Lab Results  Component Value Date   HGBA1C 5.6 04/17/2020   ------------------------------------------------------------------------------------------------------------------ No results for input(s): TSH, T4TOTAL, T3FREE, THYROIDAB in the last 72 hours.  Invalid input(s): FREET3  Cardiac Enzymes No results for input(s): CKMB, TROPONINI, MYOGLOBIN in the last 168 hours.  Invalid input(s): CK ------------------------------------------------------------------------------------------------------------------    Component Value Date/Time   BNP 82.2 04/17/2020 0259    Micro Results Recent Results (from the past 240 hour(s))  Culture, blood (x 2)     Status: None (Preliminary result)   Collection Time: 04/17/20  2:59 AM   Specimen: BLOOD  Result Value Ref Range Status   Specimen Description BLOOD RIGHT ARM  Final   Special Requests   Final    BOTTLES DRAWN AEROBIC AND ANAEROBIC Blood Culture results may not be optimal due to an inadequate volume of blood received in culture bottles   Culture   Final    NO GROWTH < 12 HOURS Performed at [facility], [place] [address] [place], [place] [address]    Report  Status PENDING  Incomplete  Culture, blood (x 2)     Status: None (Preliminary result)   Collection Time: 04/17/20  3:05 AM   Specimen: BLOOD  Result Value Ref Range Status   Specimen Description BLOOD LEFT ARM  Final   Special Requests   Final    BOTTLES DRAWN AEROBIC AND ANAEROBIC Blood Culture results may not be optimal due to an inadequate volume of blood received in culture bottles   Culture   Final    NO GROWTH < 12 HOURS Performed at [facility], [place] [address], [place], [place] [address]    Report Status PENDING  Incomplete    Radiology Reports DG Chest 2 View  Result Date: 03/22/2020 CLINICAL DATA:  Approximately 15 falls in the past month.  Smoker. EXAM: CHEST - 2 VIEW COMPARISON:  02/21/2020 FINDINGS: Normal sized heart. Clear lungs. Stable left subclavian bipolar  pacemaker and leads. Thoracic spine degenerative changes. No fracture or pneumothorax seen. IMPRESSION: No acute abnormality. Electronically Signed   By: Claudie Revering M.D.   On: 03/22/2020 12:53   CT HEAD WO CONTRAST  Result Date: 03/22/2020 CLINICAL DATA:  Frequent falls at home, including today with facial injury. Slow speech. Flat affect. EXAM: CT HEAD WITHOUT CONTRAST TECHNIQUE: Contiguous axial images were obtained from the base of the skull through the vertex without intravenous contrast. COMPARISON:  02/21/2020 head CT. FINDINGS: Brain: No evidence of parenchymal hemorrhage or extra-axial fluid collection. No mass lesion, mass effect, or midline shift. No CT evidence of acute infarction. Cerebral volume is age appropriate. No ventriculomegaly. Vascular: No acute abnormality. Skull: No evidence of calvarial fracture. Sinuses/Orbits: The visualized paranasal sinuses are essentially clear. Other:  The mastoid air cells are unopacified. IMPRESSION: Negative head CT. No evidence of acute intracranial abnormality. No evidence of calvarial fracture. Electronically Signed   By: Ilona Sorrel M.D.   On: 03/22/2020 19:49    DG Chest Port 1 View  Result Date: 04/17/2020 CLINICAL DATA:  Shortness of breath, COVID positive. EXAM: PORTABLE CHEST 1 VIEW.  Patient is rotated. COMPARISON:  Chest x-ray 03/22/2020 FINDINGS: Left chest wall 2 lead cardiac pacemaker in stable position. The heart size and mediastinal contours are unchanged. Aortic arch calcification. Biapical pleural/pulmonary scarring. Lower to mid lung zone vague hazy airspace opacity. No pulmonary edema. Interval development of a trace left pleural effusion. No pneumothorax. No acute osseous abnormality. Partially visualized cervical spine hardware. IMPRESSION: 1. Interval development of a trace left pleural effusion with underlying infection/inflammation not excluded. 2. Lower to mid lung zone vague hazy airspace opacity that could represent atelectasis versus infection/inflammation. Also limited evaluation due to patient rotation. 3. Followup PA and lateral chest X-ray is recommended in 3-4 weeks following therapy to ensure resolution and exclude underlying malignancy. Electronically Signed   By: Iven Finn M.D.   On: 04/17/2020 00:17

## 2020-04-18 DIAGNOSIS — N3000 Acute cystitis without hematuria: Secondary | ICD-10-CM

## 2020-04-18 LAB — CBC WITH DIFFERENTIAL/PLATELET
Abs Immature Granulocytes: 0.04 10*3/uL (ref 0.00–0.07)
Basophils Absolute: 0 10*3/uL (ref 0.0–0.1)
Basophils Relative: 0 %
Eosinophils Absolute: 0 10*3/uL (ref 0.0–0.5)
Eosinophils Relative: 0 %
HCT: 35.1 % — ABNORMAL LOW (ref 36.0–46.0)
Hemoglobin: 11.1 g/dL — ABNORMAL LOW (ref 12.0–15.0)
Immature Granulocytes: 0 %
Lymphocytes Relative: 12 %
Lymphs Abs: 1.2 10*3/uL (ref 0.7–4.0)
MCH: 27.9 pg (ref 26.0–34.0)
MCHC: 31.6 g/dL (ref 30.0–36.0)
MCV: 88.2 fL (ref 80.0–100.0)
Monocytes Absolute: 0.8 10*3/uL (ref 0.1–1.0)
Monocytes Relative: 8 %
Neutro Abs: 8.2 10*3/uL — ABNORMAL HIGH (ref 1.7–7.7)
Neutrophils Relative %: 80 %
Platelets: 332 10*3/uL (ref 150–400)
RBC: 3.98 MIL/uL (ref 3.87–5.11)
RDW: 15 % (ref 11.5–15.5)
WBC: 10.4 10*3/uL (ref 4.0–10.5)
nRBC: 0 % (ref 0.0–0.2)

## 2020-04-18 LAB — LEGIONELLA PNEUMOPHILA SEROGP 1 UR AG: L. pneumophila Serogp 1 Ur Ag: NEGATIVE

## 2020-04-18 LAB — COMPREHENSIVE METABOLIC PANEL
ALT: 15 U/L (ref 0–44)
AST: 15 U/L (ref 15–41)
Albumin: 2.3 g/dL — ABNORMAL LOW (ref 3.5–5.0)
Alkaline Phosphatase: 41 U/L (ref 38–126)
Anion gap: 11 (ref 5–15)
BUN: <5 mg/dL — ABNORMAL LOW (ref 8–23)
CO2: 21 mmol/L — ABNORMAL LOW (ref 22–32)
Calcium: 7.9 mg/dL — ABNORMAL LOW (ref 8.9–10.3)
Chloride: 107 mmol/L (ref 98–111)
Creatinine, Ser: 0.67 mg/dL (ref 0.44–1.00)
GFR, Estimated: >60 mL/min (ref >60)
Glucose, Bld: 84 mg/dL (ref 70–99)
Potassium: 3.9 mmol/L (ref 3.5–5.1)
Sodium: 139 mmol/L (ref 135–145)
Total Bilirubin: 1.2 mg/dL (ref 0.3–1.2)
Total Protein: 5 g/dL — ABNORMAL LOW (ref 6.5–8.1)

## 2020-04-18 LAB — PHOSPHORUS: Phosphorus: 2.1 mg/dL — ABNORMAL LOW (ref 2.5–4.6)

## 2020-04-18 LAB — MRSA PCR SCREENING: MRSA by PCR: NEGATIVE

## 2020-04-18 LAB — C-REACTIVE PROTEIN: CRP: 15.2 mg/dL — ABNORMAL HIGH (ref <1.0)

## 2020-04-18 LAB — D-DIMER, QUANTITATIVE: D-Dimer, Quant: 2.02 ug/mL-FEU — ABNORMAL HIGH (ref 0.00–0.50)

## 2020-04-18 MED ORDER — ENSURE ENLIVE PO LIQD
237.0000 mL | Freq: Three times a day (TID) | ORAL | Status: DC
  Administered 2020-04-18 – 2020-04-21 (×7): 237 mL via ORAL

## 2020-04-18 MED ORDER — K PHOS MONO-SOD PHOS DI & MONO 155-852-130 MG PO TABS
500.0000 mg | ORAL_TABLET | Freq: Two times a day (BID) | ORAL | Status: AC
  Administered 2020-04-18 – 2020-04-20 (×6): 500 mg via ORAL
  Filled 2020-04-18 (×7): qty 2

## 2020-04-18 MED ORDER — PROSOURCE PLUS PO LIQD
30.0000 mL | Freq: Two times a day (BID) | ORAL | Status: DC
  Administered 2020-04-18 – 2020-04-20 (×4): 30 mL via ORAL
  Filled 2020-04-18 (×4): qty 30

## 2020-04-18 NOTE — Evaluation (Signed)
Clinical/Bedside Swallow Evaluation Patient Details  Name: [name] MRN: [id number] Date of Birth: 04/29/1955  Today's Date: 04/18/2020 Time: SLP Start Time (ACUTE ONLY): 1228 SLP Stop Time (ACUTE ONLY): 1248 SLP Time Calculation (min) (ACUTE ONLY): 20 min  Past Medical History:  Past Medical History:  Diagnosis Date  . Anxiety    anxiety and mild depressive order systoms  . Bipolar disorder ([place])     (02/20/2013)  . Cervical cancer ([place]) 03/19/1980  . Depression   . Dysrhythmia   . [id number])    "monthly" (02/20/2013)  . Pacemaker    medtronic  . PONV (postoperative nausea and vomiting)   . Sinus pause 02/14/2013  . Syncope and collapse    echo-April 12,2012-EF 55% Echo normal; recorder with prolonged sinus pauses --> status post  . Tobacco abuse   . Vertigo    Past Surgical History:  Past Surgical History:  Procedure Laterality Date  . ABDOMINAL HYSTERECTOMY  03/19/1980   "partial"  . BACK SURGERY    . CERVICAL FUSION Left 11/2013   C4-C6  . CERVICAL FUSION     3,4,5,  . INSERT / REPLACE / REMOVE PACEMAKER  02/20/2013   medtronic  . LOOP RECORDER IMPLANT N/A 11/06/2012   Procedure: LINQ LOOP RECORDER IMPLANT;  Surgeon: Sanda Klein, MD;  Location: [facility];  Service: Cardiovascular;  Laterality: N/A;  . NECK SURGERY  11/17/2013  . PACEMAKER PLACEMENT N/A 02/16/2013  . PERMANENT PACEMAKER INSERTION N/A 02/20/2013   Procedure: PERMANENT PACEMAKER INSERTION;  Surgeon: Sanda Klein, MD;  Location: [facility];  Service: Cardiovascular;  Laterality: N/A;  . POSTERIOR LUMBAR FUSION  ~ 2009  . ROBOTIC ASSISTED SALPINGO OOPHERECTOMY Bilateral 11/25/2018   Procedure: XI ROBOTIC ASSISTED BILATERAL SALPINGO OOPHORECTOMY and LYSIS OF ADHESIONS.;  Surgeon: Everitt Amber, MD;  Location: [facility];  Service: Gynecology;  Laterality: Bilateral;  . SPINAL FUSION  03/19/2014  . TONSILLECTOMY    . TRANSTHORACIC ECHOCARDIOGRAM  07/11/2010   The left atrial size is normal.There is  no evidence of mitral vavle prolaspe.Right ventriicular systolic pressure is normal . Injection of contrast documented no interatrial shunt. Essentially normal 2D echo -doppler study    HPI:  Pt is a 65 y.o. female with PMH of depression, anxiety, physical deconditioning, bipolar disorder, back/neck surgeries, and pacemaker placement. She presented from SNF with increased lethargy and sepsis secondary to pneumonia in setting of COVID-19. Pt testing positive for COVID-19 one week prior to admission. Pt was seen by [facility],  Dr. Felecia Shelling, on 02/08/20 and an outpt MBS was recommended. She had progressive generalized weakness as well as worsening swallow function; there were concerns of possible myasthenia gravis and she was scheduled for a neuro f/u in six months.  MBS completed during admission in December on 02/24/20: moderate oral dysphagia, delayed swallow initiation and decreased UES opening due to prior history of ACDF and presence of cervical fusion plate. Oral phase is adequate, but strength for negative oral pressure to draw in bolus appears impaired. Bolus cohesion also poor with long thin bolus, contributing to delayed swallow initaition. Aspiration noted prior to adequate hyoid burst and laryngeal closure with consecutive or large boluses of thin liquids. A regular texture diet with individual sips of thin liquids via cup only was recommended at that time. CXR 1/29: Interval development of a trace left pleural effusion with  underlying infection/inflammation not excluded. Lower to mid lung zone vague hazy airspace opacity that could  represent atelectasis versus infection/inflammation.  Assessment / Plan / Recommendation Clinical Impression  Pt was seen for bedside swallow evaluation. She reported that she recalls completing the modified barium swallow study in December and that is was "pretty normal". Pt stated that she has been attempting to masticate her food well, but has  not been observing any other precautions. With cueing pt stated that she should avoid straws and does not use them. However, she requested a straw multiple times during the session and a partially consumed cup of juice was noted at bedside with a straw in it. Oral mechanism exam was significant for left-sided facial weakness and reduced lingual strength and ROM. Mastication was prolonged and rate of mastication slow despite pt's use of small bolus sizes. Pt more consistently used consecutive swallows and coughing was noted via cup and straw, suggesting aspiration. Pt's cough was weak and it's effectiveness is therefore questioned. Signs of aspiration were eliminated with individual sips via cup and no s/sx of aspiration were noted with solids.  A dysphagia 3 diet with individual sips of thin liquids is recommended at this time with supervision to ensure observance of swallowing precautions. SLP will follow pt. SLP Visit Diagnosis: Dysphagia, oropharyngeal phase (R13.12)    Aspiration Risk  Mild aspiration risk    Diet Recommendation Dysphagia 3 (Mech soft);Thin liquid   Liquid Administration via: Cup;No straw Medication Administration: Whole meds with puree Supervision: Staff to assist with self feeding;Full supervision/cueing for compensatory strategies Compensations: Slow rate;Small sips/bites (Individual sips only) Postural Changes: Seated upright at 90 degrees    Other  Recommendations Oral Care Recommendations: Oral care BID   Follow up Recommendations Skilled Nursing facility      Frequency and Duration min 2x/week  1 week       Prognosis Prognosis for Safe Diet Advancement: Good      Swallow Study   General Date of Onset: 03/05/21 HPI: Pt is a 65 y.o. female with PMH of depression, anxiety, physical deconditioning, bipolar disorder, back/neck surgeries, and pacemaker placement. She presented from SNF with increased lethargy and sepsis secondary to pneumonia in setting of  COVID-19. Pt testing positive for COVID-19 one week prior to admission. Pt was seen by [facility],  Dr. Felecia Shelling, on 02/08/20 and an outpt MBS was recommended. She had progressive generalized weakness as well as worsening swallow function; there were concerns of possible myasthenia gravis and she was scheduled for a neuro f/u in six months.  MBS completed during admission in December on 02/24/20: moderate oral dysphagia, delayed swallow initiation and decreased UES opening due to prior history of ACDF and presence of cervical fusion plate. Oral phase is adequate, but strength for negative oral pressure to draw in bolus appears impaired. Bolus cohesion also poor with long thin bolus, contributing to delayed swallow initaition. Aspiration noted prior to adequate hyoid burst and laryngeal closure with consecutive or large boluses of thin liquids. A regular texture diet with individual sips of thin liquids via cup only was recommended at that time. CXR 1/29: Interval development of a trace left pleural effusion with  underlying infection/inflammation not excluded. Lower to mid lung zone vague hazy airspace opacity that could  represent atelectasis versus infection/inflammation. Type of Study: Bedside Swallow Evaluation Previous Swallow Assessment: See HPI Diet Prior to this Study: Regular;Thin liquids Temperature Spikes Noted: No Respiratory Status: Room air History of Recent Intubation: No Behavior/Cognition: Alert;Cooperative Oral Cavity Assessment: Within Functional Limits Oral Care Completed by SLP: No Oral Cavity - Dentition: Dentures, top Vision: Functional for self-feeding  Self-Feeding Abilities: Able to feed self Patient Positioning: Upright in bed;Postural control adequate for testing Baseline Vocal Quality: Low vocal intensity (slightly strained) Volitional Cough: Weak Volitional Swallow: Able to elicit    Oral/Motor/Sensory Function Overall Oral Motor/Sensory Function: Mild  impairment Facial ROM: Reduced left;Suspected CN VII (facial) dysfunction Facial Symmetry: Abnormal symmetry left;Suspected CN VII (facial) dysfunction Facial Strength: Reduced left;Suspected CN VII (facial) dysfunction Facial Sensation: Within Functional Limits Lingual ROM: Reduced left;Suspected CN XII (hypoglossal) dysfunction;Reduced right   Ice Chips Ice chips: Not tested   Thin Liquid Thin Liquid: Impaired Presentation: Straw;Cup    Nectar Thick Nectar Thick Liquid: Not tested   Honey Thick Honey Thick Liquid: Not tested   Puree Puree: Within functional limits   Solid     Solid: Impaired Presentation: Self Fed Oral Phase Impairments: Impaired mastication Oral Phase Functional Implications: Impaired mastication     Lilymarie Scroggins I. Hardin Negus, [place], [place] Office number [phone] Pager [place] 04/18/2020,1:21 PM

## 2020-04-18 NOTE — NC FL2 (Signed)
[place] MEDICAID FL2 LEVEL OF CARE SCREENING TOOL     IDENTIFICATION  Patient Name: [name] Birthdate: 17-Jul-1955 Sex: female Admission Date (Current Location): 04/16/2020  [facility] and [place] Number:  Herbalist and Address:  [facility] [place], [place] [address], [place], [place] [address]      Provider Number: [id number]  Attending Physician Name and Address:  Albertine Patricia, MD  Relative Name and Phone Number:  [name], daughter, [phone]    Current Level of Care: Hospital Recommended Level of Care: [place] Prior Approval Number:    Date Approved/Denied:   PASRR Number: pending  Discharge Plan: SNF    Current Diagnoses: Patient Active Problem List   Diagnosis Date Noted  . Sepsis secondary to UTI ([place]) 04/17/2020  . COVID-19 virus infection 04/17/2020  . Hypoglycemia without diagnosis of diabetes mellitus 04/17/2020  . Sepsis due to pneumonia ([place]) 04/17/2020  . Physical deconditioning   . Myositis 03/03/2020  . HCAP (healthcare-associated pneumonia) 02/21/2020  . Cough 02/21/2020  . Generalized weakness 02/21/2020  . GAD (generalized anxiety disorder) 02/21/2020  . Proximal muscle weakness 02/08/2020  . Dysphagia 02/08/2020  . Gait disturbance 01/04/2020  . Elevated CK 01/04/2020  . Weakness of both lower extremities 01/04/2020  . Encephalopathy   . Polypharmacy   . CAP (community acquired pneumonia) 12/18/2019  . Fall 12/18/2019  . Hypokalemia   . Abnormal liver function test   . Cervical spondylosis 09/23/2019  . Bilateral elbow joint pain 08/26/2019  . Memory loss 07/02/2019  . Depression with anxiety 07/02/2019  . B12 deficiency 07/02/2019  . Lumbar radiculopathy 02/10/2019  . History of fusion of lumbar spine 02/10/2019  . Dysesthesia 02/10/2019  . Retroperitoneal fibrosis   . Pelvic adhesions   . Left ovarian cyst 11/17/2018  . Elevated tumor markers 11/17/2018  . Dyspnea 09/15/2018   . Chest pain of uncertain etiology 03/27/3233  . Laryngopharyngeal reflux (LPR) 05/19/2018  . Bipolar disorder ([place]) 04/16/2018  . Insomnia 04/16/2018  . Attention deficit hyperactivity disorder (ADHD) 04/16/2018  . Neck pain on right side 09/09/2014  . 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 Family history of arrhythmogenic right ventricular cardiomyopathy 03/30/2013  . 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 02/20/2013  . Sinus pause 02/14/2013  . Hx of syncope- s/p Loop recorder 11/06/12 10/29/2012  . Vertigo 10/29/2012  . Tobacco abuse 10/29/2012  . ANXIETY 11/14/2009  . CERUMEN IMPACTION, RIGHT 11/14/2009  . BENIGN POSITIONAL VERTIGO 11/14/2009  . EAR PAIN, RIGHT 11/14/2009  . SCIATICA 05/09/2009    Orientation RESPIRATION BLADDER Height & Weight     Self,Place  Normal Incontinent,External catheter Weight: 117 lb (53.1 kg) Height:  5\' 7"  (170.2 cm)  BEHAVIORAL SYMPTOMS/MOOD NEUROLOGICAL BOWEL NUTRITION STATUS      Continent Diet (Please see DC Summary)  AMBULATORY STATUS COMMUNICATION OF NEEDS Skin   Extensive Assist Verbally Normal                       Personal Care Assistance Level of Assistance  Bathing,Feeding,Dressing Bathing Assistance: Maximum assistance Feeding assistance: Limited assistance Dressing Assistance: Limited assistance     Functional Limitations Info             SPECIAL CARE FACTORS FREQUENCY  PT (By licensed PT),OT (By licensed OT)     PT Frequency: 5x/week OT Frequency: 5x/week            Contractures Contractures Info: Not present  Additional Factors Info  Code Status,Allergies,Isolation Precautions,Psychotropic Code Status Info: Full Allergies Info: Penicillins Psychotropic Info: isperdal; Lexapro   Isolation Precautions Info: COVID+ 04/17/20     Current Medications (04/18/2020):  This is the current hospital active medication list Current Facility-Administered Medications  Medication Dose  Route Frequency Provider Last Rate Last Admin  . (feeding supplement) PROSource Plus liquid 30 mL  30 mL Oral BID BM Elgergawy, Silver Huguenin, MD   30 mL at 04/18/20 0852  . 0.9 % NaCl with KCl 20 mEq/ L  infusion   Intravenous Continuous Elgergawy, Silver Huguenin, MD 50 mL/hr at 04/18/20 0354 Infusion Verify at 04/18/20 0354  . acetaminophen (TYLENOL) tablet 650 mg  650 mg Oral Q6H PRN Opyd, Ilene Qua, MD      . ALPRAZolam Duanne Moron) tablet 0.5 mg  0.5 mg Oral QHS PRN Elgergawy, Silver Huguenin, MD      . cefTRIAXone (ROCEPHIN) 2 g in sodium chloride 0.9 % 100 mL IVPB  2 g Intravenous QHS Opyd, Ilene Qua, MD   Stopped at 04/17/20 2310  . enoxaparin (LOVENOX) injection 40 mg  40 mg Subcutaneous Daily Opyd, Ilene Qua, MD   40 mg at 04/18/20 0852  . escitalopram (LEXAPRO) tablet 5 mg  5 mg Oral QHS Elgergawy, Silver Huguenin, MD      . feeding supplement (ENSURE ENLIVE / ENSURE PLUS) liquid 237 mL  237 mL Oral TID BM Elgergawy, Silver Huguenin, MD   237 mL at 04/18/20 0853  . mirtazapine (REMERON) tablet 7.5 mg  7.5 mg Oral QHS Elgergawy, Silver Huguenin, MD      . ondansetron (ZOFRAN) tablet 4 mg  4 mg Oral Q6H PRN Opyd, Ilene Qua, MD       Or  . ondansetron (ZOFRAN) injection 4 mg  4 mg Intravenous Q6H PRN Opyd, Ilene Qua, MD      . phosphorus (K PHOS NEUTRAL) tablet 500 mg  500 mg Oral BID Elgergawy, Silver Huguenin, MD   500 mg at 04/18/20 0853  . remdesivir 100 mg in sodium chloride 0.9 % 100 mL IVPB  100 mg Intravenous Daily Opyd, Ilene Qua, MD 200 mL/hr at 04/18/20 0859 100 mg at 04/18/20 0859  . risperiDONE (RISPERDAL) tablet 3 mg  3 mg Oral QHS Elgergawy, Silver Huguenin, MD      . senna-docusate (Senokot-S) tablet 1 tablet  1 tablet Oral QHS PRN Opyd, Ilene Qua, MD      . vitamin B-12 (CYANOCOBALAMIN) tablet 1,000 mcg  1,000 mcg Oral Daily Opyd, Ilene Qua, MD   1,000 mcg at 04/18/20 1443     Discharge Medications: Please see discharge summary for a list of discharge medications.  Relevant Imaging Results:  Relevant Lab  Results:   Additional Information SSN [id number]  Has had Covid Vaccines  Benard Halsted, LCSW

## 2020-04-18 NOTE — Progress Notes (Signed)
PROGRESS NOTE                                                                             PROGRESS NOTE                                                                                                                                                                                                             Patient Demographics:    [name], is a 65 y.o. female, DOB - 11-Oct-1955, HYW:[id number]  Outpatient Primary MD for the patient is [name], [place], MD    LOS - 1  Admit date - 04/16/2020    Chief Complaint  Patient presents with  . Covid Positive       Brief Narrative    HPI: [name] is a 65 y.o. female with medical history significant for depression, anxiety, physical deconditioning, B12 deficiency, and reportedly positive for Covid about a week ago, now presenting from her SNF for evaluation of worsening lethargy for oral intake.  Patient reportedly tested positive for COVID-19 about 1 week ago, has been more lethargic at her SNF, not eating or drinking much, and it was suspected that she may have a UTI but she had not been started on treatment for that.  Patient reports cough, shortness of breath, and loss of appetite, but denies dysuria, suprapubic or flank pain, fevers, chills, chest pain, abdominal pain, vomiting, or diarrhea.  ED Course: Upon arrival to the ED, patient is found to be afebrile, saturating mid 90s on room air, tachypneic, tachycardic in the 110s, and with stable blood pressure.  EKG features a sinus tachycardia.  Chest x-ray is notable for trace left pleural effusion and hazy lung opacities.  Chemistry panel features a potassium of 2.9 and albumin 2.7.  CBC notable for leukocytosis to 11,300 and a mild normocytic anemia.  COVID-19 antigens positive.  Urinalysis compatible with possible infection.  Patient was given a liter of LR, 1 g IV Rocephin, 10 mEq IV potassium, and remdesivir in the ED.   Subjective:  [name] today denies any chest pain, reports appetite is improving, reports generalized weakness and fatigue .   Assessment  & Plan :    Principal Problem:   Sepsis due to pneumonia [facility]) Active Problems:   Depression with anxiety   B12 deficiency   Hypokalemia   COVID-19 virus infection   Physical deconditioning   Hypoglycemia without diagnosis of diabetes mellitus    COVID-19  infection -Patient reports she is vaccinated, but she does not recall if she received a booster - Presents from SNF with lethargy and poor oral intake, is reported to have tested positive for COVID ~1 wk ago and was suspected to have a UTI but not started on treatment  - She is found to be tachycardic and tachypneic with leukocytosis to 14,300  -Continue with remdesivir -Has no hypoxia so I will hold on starting steroids  UTI -Urine culture growing Proteus mirabilis, continue with IV Rocephin  Failure to thrive/severe deconditioning/protein calorie malnutrition -PT OT will be consulted. -Continue with IV fluids -Started on supplements -Nutrition is consulted   Hypokalemia  - Repleted    Hypophosphatemia -Repleted, monitor closely for refeeding syndrome  Hypoglycemia  - Serum glucose was normal in ED and she had a CBG of 47 that was taken at about the same time and may have been spurious   Deconditioning  - Continue PT    B12 deficiency  - Continue B12 supplementation    Depression, anxiety  - Resume Xanax at a lower dose, continue with Risperdal,  started on Lexapro.   SpO2: 96 %  Recent Labs  Lab 04/16/20 0003 04/17/20 0259 04/17/20 1135 04/18/20 0257  WBC 14.3*  --  10.2 10.4  PLT 263  --  291 332  CRP  --  16.6*  --  15.2*  BNP  --  82.2  --   --   DDIMER  --  1.95*  --  2.02*  PROCALCITON  --  <0.10  --   --   AST 19  --  19 15  ALT 13  --  12 15  ALKPHOS 40  --  35* 41  BILITOT 1.8*  --  1.4* 1.2  ALBUMIN 2.7*  --  2.4* 2.3*  INR  --  1.2  --   --   LATICACIDVEN   --  1.2  --   --        ABG     Component Value Date/Time   HCO3 24.3 03/22/2020 1910   TCO2 26 03/22/2020 1910   ACIDBASEDEF 2.0 03/22/2020 1910   O2SAT 54.0 03/22/2020 1910        Condition - Extremely Guarded  Family communication: Left voicemail for daughter [name]  Code Status :  full  Consults  :  none  Procedures  :  none   Status is: Inpatient  Remains inpatient appropriate because:IV treatments appropriate due to intensity of illness or inability to take PO   Dispo: The patient is from: SNF              Anticipated d/c is to: SNF              Anticipated d/c date is: 2 days              Patient currently is not medically stable to d/c.   Difficult to place patient No      DVT Prophylaxis  :  Lovenox   Lab Results  Component Value Date  PLT 332 04/18/2020    Diet :  Diet Order            DIET DYS 3 Room service appropriate? Yes with Assist; Fluid consistency: Thin  Diet effective now                  Inpatient Medications  Scheduled Meds: . (feeding supplement) PROSource Plus  30 mL Oral BID BM  . enoxaparin (LOVENOX) injection  40 mg Subcutaneous Daily  . escitalopram  5 mg Oral QHS  . feeding supplement  237 mL Oral TID BM  . mirtazapine  7.5 mg Oral QHS  . phosphorus  500 mg Oral BID  . risperiDONE  3 mg Oral QHS  . cyanocobalamin  1,000 mcg Oral Daily   Continuous Infusions: . 0.9 % NaCl with KCl 20 mEq / L 50 mL/hr at 04/18/20 0354  . cefTRIAXone (ROCEPHIN)  IV Stopped (04/17/20 2310)  . remdesivir 100 mg in NS 100 mL 100 mg (04/18/20 0859)   PRN Meds:.acetaminophen, ALPRAZolam, ondansetron **OR** ondansetron (ZOFRAN) IV, senna-docusate  Antibiotics  :    Anti-infectives (From admission, onward)   Start     Dose/Rate Route Frequency Ordered Stop   04/18/20 1000  remdesivir 100 mg in sodium chloride 0.9 % 100 mL IVPB       "Followed by" Linked Group Details   100 mg 200 mL/hr over 30 Minutes Intravenous Daily  04/17/20 0027 04/22/20 0959   04/17/20 2200  cefTRIAXone (ROCEPHIN) 1 g in sodium chloride 0.9 % 100 mL IVPB  Status:  Discontinued        1 g 200 mL/hr over 30 Minutes Intravenous Every 24 hours 04/17/20 0217 04/17/20 0234   04/17/20 0245  cefTRIAXone (ROCEPHIN) 2 g in sodium chloride 0.9 % 100 mL IVPB        2 g 200 mL/hr over 30 Minutes Intravenous Daily at bedtime 04/17/20 0235 04/21/20 2159   04/17/20 0245  azithromycin (ZITHROMAX) 500 mg in sodium chloride 0.9 % 250 mL IVPB  Status:  Discontinued        500 mg 250 mL/hr over 60 Minutes Intravenous Daily at bedtime 04/17/20 0235 04/18/20 1037   04/17/20 0200  remdesivir 200 mg in sodium chloride 0.9% 250 mL IVPB       "Followed by" Linked Group Details   200 mg 580 mL/hr over 30 Minutes Intravenous Once 04/17/20 0027 04/17/20 0424   04/17/20 0115  cefTRIAXone (ROCEPHIN) 1 g in sodium chloride 0.9 % 100 mL IVPB  Status:  Discontinued        1 g 200 mL/hr over 30 Minutes Intravenous  Once 04/17/20 0109 04/17/20 0236     Sheriece Jefcoat M.D on 04/18/2020 at 2:23 PM  To page go to [email or web address]  [facility] -  Office  [phone]      Objective:   Vitals:   04/17/20 2353 04/18/20 0400 04/18/20 0721 04/18/20 1157  BP: 129/74 116/74 119/68 113/69  Pulse: 96 (!) 108 97 98  Resp: (!) 23 (!) 24 19 (!) 21  Temp: 98.4 F (36.9 C) 98.4 F (36.9 C) 99 F (37.2 C) 99 F (37.2 C)  TempSrc: Axillary Axillary Oral Oral  SpO2: 97% 94% 95% 96%  Weight:      Height:        Wt Readings from Last 3 Encounters:  04/16/20 53.1 kg  02/25/20 53.9 kg  02/08/20 59 kg     Intake/Output Summary (Last 24 hours) at 04/18/2020 1423 Last  data filed at 04/18/2020 1100 Gross per 24 hour  Intake 1260.15 ml  Output 600 ml  Net 660.15 ml     Physical Exam  Awake Alert, seemingly frail, deconditioned, with flat mood Symmetrical Chest wall movement, Good air movement bilaterally, CTAB RRR,No Gallops,Rubs or new Murmurs, No  Parasternal Heave +ve B.Sounds, Abd Soft, No tenderness, No rebound - guarding or rigidity. No Cyanosis, Clubbing or edema, No new Rash or bruise     Data Review:    CBC Recent Labs  Lab 04/16/20 0003 04/17/20 1135 04/18/20 0257  WBC 14.3* 10.2 10.4  HGB 11.9* 11.4* 11.1*  HCT 35.7* 35.4* 35.1*  PLT 263 291 332  MCV 87.1 87.4 88.2  MCH 29.0 28.1 27.9  MCHC 33.3 32.2 31.6  RDW 15.0 15.1 15.0  LYMPHSABS 1.2 1.0 1.2  MONOABS 0.9 0.8 0.8  EOSABS 0.0 0.0 0.0  BASOSABS 0.0 0.0 0.0    Recent Labs  Lab 04/16/20 0003 04/17/20 0259 04/17/20 1135 04/18/20 0257  NA 137  --  137 139  K 2.9*  --  3.7 3.9  CL 105  --  108 107  CO2 21*  --  20* 21*  GLUCOSE 95  --  97 84  BUN 8  --  6* <5*  CREATININE 0.78  --  0.54 0.67  CALCIUM 7.8*  --  7.4* 7.9*  AST 19  --  19 15  ALT 13  --  12 15  ALKPHOS 40  --  35* 41  BILITOT 1.8*  --  1.4* 1.2  ALBUMIN 2.7*  --  2.4* 2.3*  MG  --  1.6*  --   --   CRP  --  16.6*  --  15.2*  DDIMER  --  1.95*  --  2.02*  PROCALCITON  --  <0.10  --   --   LATICACIDVEN  --  1.2  --   --   INR  --  1.2  --   --   HGBA1C  --  5.6  --   --   BNP  --  82.2  --   --     ------------------------------------------------------------------------------------------------------------------ No results for input(s): CHOL, HDL, LDLCALC, TRIG, CHOLHDL, LDLDIRECT in the last 72 hours.  Lab Results  Component Value Date   HGBA1C 5.6 04/17/2020   ------------------------------------------------------------------------------------------------------------------ No results for input(s): TSH, T4TOTAL, T3FREE, THYROIDAB in the last 72 hours.  Invalid input(s): FREET3  Cardiac Enzymes No results for input(s): CKMB, TROPONINI, MYOGLOBIN in the last 168 hours.  Invalid input(s): CK ------------------------------------------------------------------------------------------------------------------    Component Value Date/Time   BNP 82.2 04/17/2020 0259    Micro  Results Recent Results (from the past 240 hour(s))  Culture, blood (x 2)     Status: None (Preliminary result)   Collection Time: 04/17/20  2:59 AM   Specimen: BLOOD  Result Value Ref Range Status   Specimen Description BLOOD RIGHT ARM  Final   Special Requests   Final    BOTTLES DRAWN AEROBIC AND ANAEROBIC Blood Culture results may not be optimal due to an inadequate volume of blood received in culture bottles   Culture   Final    NO GROWTH 1 DAY Performed at [facility], [place] [address], [place], [place] [address]    Report Status PENDING  Incomplete  Culture, blood (x 2)     Status: None (Preliminary result)   Collection Time: 04/17/20  3:05 AM   Specimen: BLOOD  Result Value Ref Range  Status   Specimen Description BLOOD LEFT ARM  Final   Special Requests   Final    BOTTLES DRAWN AEROBIC AND ANAEROBIC Blood Culture results may not be optimal due to an inadequate volume of blood received in culture bottles   Culture   Final    NO GROWTH 1 DAY Performed at [facility], [place] [address], [place], [place] [address]    Report Status PENDING  Incomplete  Culture, Urine     Status: Abnormal (Preliminary result)   Collection Time: 04/17/20  9:46 AM   Specimen: Urine, Random  Result Value Ref Range Status   Specimen Description URINE, RANDOM  Final   Special Requests NONE  Final   Culture (A)  Final    >=100,000 COLONIES/mL PROTEUS MIRABILIS SUSCEPTIBILITIES TO FOLLOW Performed at [facility], [place] [address] [place], [place] [address]    Report Status PENDING  Incomplete  MRSA PCR Screening     Status: None   Collection Time: 04/17/20 10:45 PM   Specimen: Nasal Mucosa; Nasopharyngeal  Result Value Ref Range Status   MRSA by PCR NEGATIVE NEGATIVE Final    Comment:        The GeneXpert MRSA Assay (FDA approved for NASAL specimens only), is one component of a comprehensive MRSA colonization surveillance program. It is not intended to diagnose MRSA infection  nor to guide or monitor treatment for MRSA infections. Performed at [facility], [place] [address], [place], [place] [address]     Radiology Reports DG Chest 2 View  Result Date: 03/22/2020 CLINICAL DATA:  Approximately 15 falls in the past month.  Smoker. EXAM: CHEST - 2 VIEW COMPARISON:  02/21/2020 FINDINGS: Normal sized heart. Clear lungs. 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 Thoracic spine degenerative changes. No fracture or pneumothorax seen. IMPRESSION: No acute abnormality. Electronically Signed   By: Claudie Revering M.D.   On: 03/22/2020 12:53   CT HEAD WO CONTRAST  Result Date: 03/22/2020 CLINICAL DATA:  Frequent falls at home, including today with facial injury. Slow speech. Flat affect. EXAM: CT HEAD WITHOUT CONTRAST TECHNIQUE: Contiguous axial images were obtained from the base of the skull through the vertex without intravenous contrast. COMPARISON:  02/21/2020 head CT. FINDINGS: Brain: No evidence of parenchymal hemorrhage or extra-axial fluid collection. No mass lesion, mass effect, or midline shift. No CT evidence of acute infarction. Cerebral volume is age appropriate. No ventriculomegaly. Vascular: No acute abnormality. Skull: No evidence of calvarial fracture. Sinuses/Orbits: The visualized paranasal sinuses are essentially clear. Other:  The mastoid air cells are unopacified. IMPRESSION: Negative head CT. No evidence of acute intracranial abnormality. No evidence of calvarial fracture. Electronically Signed   By: Ilona Sorrel M.D.   On: 03/22/2020 19:49   DG Chest Port 1 View  Result Date: 04/17/2020 CLINICAL DATA:  Shortness of breath, COVID positive. EXAM: PORTABLE CHEST 1 VIEW.  Patient is rotated. COMPARISON:  Chest x-ray 03/22/2020 FINDINGS: Left chest wall 2 lead cardiac pacemaker in stable position. The heart size and mediastinal contours are unchanged. Aortic arch calcification. Biapical pleural/pulmonary scarring. Lower to mid lung zone vague hazy  airspace opacity. No pulmonary edema. Interval development of a trace left pleural effusion. No pneumothorax. No acute osseous abnormality. Partially visualized cervical spine hardware. IMPRESSION: 1. Interval development of a trace left pleural effusion with underlying infection/inflammation not excluded. 2. Lower to mid lung zone vague hazy airspace opacity that could represent atelectasis versus infection/inflammation. Also limited evaluation due to patient  rotation. 3. Followup PA and lateral chest X-ray is recommended in 3-4 weeks following therapy to ensure resolution and exclude underlying malignancy. Electronically Signed   By: Iven Finn M.D.   On: 04/17/2020 00:17

## 2020-04-18 NOTE — Plan of Care (Signed)
  Problem: Clinical Measurements: Goal: Will remain free from infection Outcome: Progressing   Problem: Nutrition: Goal: Adequate nutrition will be maintained Outcome: Progressing   Problem: Education: Goal: Knowledge of risk factors and measures for prevention of condition will improve Outcome: Progressing   Problem: Respiratory: Goal: Will maintain a patent airway Outcome: Progressing   Problem: Education: Goal: Knowledge of General Education information will improve Description: Including pain rating scale, medication(s)/side effects and non-pharmacologic comfort measures Outcome: Not Progressing   Problem: Health Behavior/Discharge Planning: Goal: Ability to manage health-related needs will improve Outcome: Not Progressing   Problem: Activity: Goal: Risk for activity intolerance will decrease Outcome: Not Progressing

## 2020-04-18 NOTE — Progress Notes (Signed)
Ok to dc azithromycin per Dr. [facility].  Onnie Boer, PharmD, BCIDP, AAHIVP, CPP Infectious Disease Pharmacist 04/18/2020 10:39 AM

## 2020-04-18 NOTE — TOC Initial Note (Addendum)
Transition of Care [facility]) - Initial/Assessment Note    Patient Details  Name: [name] MRN: [id number] Date of Birth: 07-Aug-1955  Transition of Care [facility]) CM/SW Contact:    Benard Halsted, LCSW Phone Number: 04/18/2020, 11:38 AM  Clinical Narrative:                 Patient arrives to the hospital from [facility] (short term rehab). She will require insurance approval to return, per [employer]. She will check on status of Personnel officer. CSW notes patient's pasrr expired last month; will submit for new pasrr.  CSW received call from patient's daughter, [name]. She is requesting CSW send clinicals to SNFs in [place] where she lives. She stated understanding that she will likely have to pay privately and she is willing to come pick patient up. CSW explained that patient may need to return to [facility] if she is medically ready to discharge soon (and if facility will not be able to accept COVID + patients) and she reported understanding.  Expected Discharge Plan: Skilled Nursing Facility Barriers to Discharge: Insurance Authorization,Continued Medical Work up   Patient Goals and CMS Choice        Expected Discharge Plan and Services Expected Discharge Plan: [place] In-house Referral: Clinical Social Work     Living arrangements for the past 2 months: [place]                                      Prior Living Arrangements/Services Living arrangements for the past 2 months: [place] Lives with:: Self Patient language and need for interpreter reviewed:: Yes Do you feel safe going back to the place where you live?: Yes      Need for Family Participation in Patient Care: Yes (Comment) Care giver support system in place?: Yes (comment)   Criminal Activity/Legal Involvement Pertinent to Current Situation/Hospitalization: No - Comment as needed  Activities of Daily Living Home Assistive  Devices/Equipment: Gilford Rile (specify type) ADL Screening (condition at time of admission) Patient's cognitive ability adequate to safely complete daily activities?: Yes Is the patient deaf or have difficulty hearing?: No Does the patient have difficulty seeing, even when wearing glasses/contacts?: No Does the patient have difficulty concentrating, remembering, or making decisions?: Yes Patient able to express need for assistance with ADLs?: Yes Does the patient have difficulty dressing or bathing?: Yes Independently performs ADLs?: No Communication: Independent Dressing (OT): Needs assistance Is this a change from baseline?: Pre-admission baseline Grooming: Needs assistance Is this a change from baseline?: Pre-admission baseline Feeding: Needs assistance Is this a change from baseline?: Change from baseline, expected to last >3 days Bathing: Dependent Is this a change from baseline?: Pre-admission baseline Toileting: Dependent Is this a change from baseline?: Change from baseline, expected to last >3days In/Out Bed: Needs assistance Is this a change from baseline?: Change from baseline, expected to last >3 days Walks in Home: Needs assistance Is this a change from baseline?: Change from baseline, expected to last >3 days Does the patient have difficulty walking or climbing stairs?: Yes Weakness of Legs: Both Weakness of Arms/Hands: None  Permission Sought/Granted Permission sought to share information with : Facility Sport and exercise psychologist                Emotional Assessment   Attitude/Demeanor/Rapport: Unable to Assess Affect (typically observed): Unable to Assess Orientation: : Oriented to  Self,Oriented to Place Alcohol / Substance Use: Not Applicable Psych Involvement: No (comment)  Admission diagnosis:  Hypokalemia [E87.6] Hypoglycemia [E16.2] Acute cystitis without hematuria [N30.00] Sepsis secondary to UTI ([place]) [A41.9, N39.0] Sepsis due to pneumonia ([place]) [J18.9,  A41.9] COVID-19 [U07.1] Patient Active Problem List   Diagnosis Date Noted  . Sepsis secondary to UTI ([place]) 04/17/2020  . COVID-19 virus infection 04/17/2020  . Hypoglycemia without diagnosis of diabetes mellitus 04/17/2020  . Sepsis due to pneumonia ([place]) 04/17/2020  . Physical deconditioning   . Myositis 03/03/2020  . HCAP (healthcare-associated pneumonia) 02/21/2020  . Cough 02/21/2020  . Generalized weakness 02/21/2020  . GAD (generalized anxiety disorder) 02/21/2020  . Proximal muscle weakness 02/08/2020  . Dysphagia 02/08/2020  . Gait disturbance 01/04/2020  . Elevated CK 01/04/2020  . Weakness of both lower extremities 01/04/2020  . Encephalopathy   . Polypharmacy   . CAP (community acquired pneumonia) 12/18/2019  . Fall 12/18/2019  . Hypokalemia   . Abnormal liver function test   . Cervical spondylosis 09/23/2019  . Bilateral elbow joint pain 08/26/2019  . Memory loss 07/02/2019  . Depression with anxiety 07/02/2019  . B12 deficiency 07/02/2019  . Lumbar radiculopathy 02/10/2019  . History of fusion of lumbar spine 02/10/2019  . Dysesthesia 02/10/2019  . Retroperitoneal fibrosis   . Pelvic adhesions   . Left ovarian cyst 11/17/2018  . Elevated tumor markers 11/17/2018  . Dyspnea 09/15/2018  . Chest pain of uncertain etiology 76/54/6503  . Laryngopharyngeal reflux (LPR) 05/19/2018  . Bipolar disorder ([place]) 04/16/2018  . Insomnia 04/16/2018  . Attention deficit hyperactivity disorder (ADHD) 04/16/2018  . Neck pain on right side 09/09/2014  . 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 Family history of arrhythmogenic right ventricular cardiomyopathy 03/30/2013  . 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 02/20/2013  . Sinus pause 02/14/2013  . Hx of syncope- s/p Loop recorder 11/06/12 10/29/2012  . Vertigo 10/29/2012  . Tobacco abuse 10/29/2012  . ANXIETY 11/14/2009  . CERUMEN IMPACTION, RIGHT 11/14/2009  . BENIGN POSITIONAL VERTIGO 11/14/2009   . EAR PAIN, RIGHT 11/14/2009  . SCIATICA 05/09/2009   PCP:  Lujean Amel, MD Pharmacy:  No Pharmacies Listed    Social Determinants of Health (SDOH) Interventions    Readmission Risk Interventions No flowsheet data found.

## 2020-04-18 NOTE — Progress Notes (Signed)
1610: patient refused to be up to chair.

## 2020-04-19 DIAGNOSIS — N390 Urinary tract infection, site not specified: Secondary | ICD-10-CM

## 2020-04-19 LAB — CBC WITH DIFFERENTIAL/PLATELET
Abs Immature Granulocytes: 0.05 10*3/uL (ref 0.00–0.07)
Basophils Absolute: 0 10*3/uL (ref 0.0–0.1)
Basophils Relative: 0 %
Eosinophils Absolute: 0.1 10*3/uL (ref 0.0–0.5)
Eosinophils Relative: 1 %
HCT: 34.9 % — ABNORMAL LOW (ref 36.0–46.0)
Hemoglobin: 11.1 g/dL — ABNORMAL LOW (ref 12.0–15.0)
Immature Granulocytes: 1 %
Lymphocytes Relative: 18 %
Lymphs Abs: 1.5 10*3/uL (ref 0.7–4.0)
MCH: 28 pg (ref 26.0–34.0)
MCHC: 31.8 g/dL (ref 30.0–36.0)
MCV: 87.9 fL (ref 80.0–100.0)
Monocytes Absolute: 1 10*3/uL (ref 0.1–1.0)
Monocytes Relative: 12 %
Neutro Abs: 6 10*3/uL (ref 1.7–7.7)
Neutrophils Relative %: 68 %
Platelets: 413 10*3/uL — ABNORMAL HIGH (ref 150–400)
RBC: 3.97 MIL/uL (ref 3.87–5.11)
RDW: 15.2 % (ref 11.5–15.5)
WBC: 8.6 10*3/uL (ref 4.0–10.5)
nRBC: 0 % (ref 0.0–0.2)

## 2020-04-19 LAB — COMPREHENSIVE METABOLIC PANEL
ALT: 13 U/L (ref 0–44)
AST: 13 U/L — ABNORMAL LOW (ref 15–41)
Albumin: 2.4 g/dL — ABNORMAL LOW (ref 3.5–5.0)
Alkaline Phosphatase: 40 U/L (ref 38–126)
Anion gap: 9 (ref 5–15)
BUN: 11 mg/dL (ref 8–23)
CO2: 24 mmol/L (ref 22–32)
Calcium: 8 mg/dL — ABNORMAL LOW (ref 8.9–10.3)
Chloride: 109 mmol/L (ref 98–111)
Creatinine, Ser: 0.59 mg/dL (ref 0.44–1.00)
GFR, Estimated: >60 mL/min (ref >60)
Glucose, Bld: 85 mg/dL (ref 70–99)
Potassium: 3.4 mmol/L — ABNORMAL LOW (ref 3.5–5.1)
Sodium: 142 mmol/L (ref 135–145)
Total Bilirubin: 1 mg/dL (ref 0.3–1.2)
Total Protein: 4.8 g/dL — ABNORMAL LOW (ref 6.5–8.1)

## 2020-04-19 LAB — URINE CULTURE: Culture: >=100000 — AB

## 2020-04-19 LAB — D-DIMER, QUANTITATIVE: D-Dimer, Quant: 1.75 ug/mL-FEU — ABNORMAL HIGH (ref 0.00–0.50)

## 2020-04-19 LAB — C-REACTIVE PROTEIN: CRP: 11.7 mg/dL — ABNORMAL HIGH (ref <1.0)

## 2020-04-19 MED ORDER — CEPHALEXIN 500 MG PO CAPS
500.0000 mg | ORAL_CAPSULE | Freq: Three times a day (TID) | ORAL | Status: AC
  Administered 2020-04-20 (×4): 500 mg via ORAL
  Filled 2020-04-19 (×4): qty 1

## 2020-04-19 MED ORDER — POTASSIUM CHLORIDE CRYS ER 20 MEQ PO TBCR
40.0000 meq | EXTENDED_RELEASE_TABLET | Freq: Once | ORAL | Status: AC
  Administered 2020-04-19: 40 meq via ORAL
  Filled 2020-04-19: qty 2

## 2020-04-19 MED ORDER — ADULT MULTIVITAMIN W/MINERALS CH
1.0000 | ORAL_TABLET | Freq: Every day | ORAL | Status: DC
  Administered 2020-04-19 – 2020-04-21 (×3): 1 via ORAL
  Filled 2020-04-19 (×3): qty 1

## 2020-04-19 NOTE — TOC Progression Note (Signed)
Transition of Care [facility]) - Progression Note    Patient Details  Name: [name] MRN: [id number] Date of Birth: Jun 15, 1955  Transition of Care [facility]) CM/SW [place], LCSW Phone Number: 04/19/2020, 4:09 PM  Clinical Narrative:    CSW received email from patient's daughter, [name], requesting referral be faxed to [facility] Cell: [phone] /Fax for clinical paperwork: [phone]). CSW called number but went to voicemail and vm box was full. CSW faxed clinicals.     Expected Discharge Plan: [place] Barriers to Discharge: Insurance Authorization,Continued Medical Work up  Expected Discharge Plan and Services Expected Discharge Plan: [place] In-house Referral: Clinical Social Work     Living arrangements for the past 2 months: [place]                                       Social Determinants of Health (SDOH) Interventions    Readmission Risk Interventions No flowsheet data found.

## 2020-04-19 NOTE — Progress Notes (Signed)
  Speech Language Pathology Treatment: Dysphagia  Patient Details Name: [name] MRN: [id number] DOB: Jul 17, 1955 Today's Date: 04/19/2020 Time: 7829-5621 SLP Time Calculation (min) (ACUTE ONLY): 12 min  Assessment / Plan / Recommendation Clinical Impression  Pt describes intermittent difficulty with swallowing.  She states some days are worse than others.  She was unable to describe the recommendations from Dec MBS after reviewed yesterday with SLP, but when reminded, she agreed with precautions for single, individual sips of thin liquids, forgetting that it is safer to avoid straws.  She demonstrated the best way to drink water, executing precautions as reviewed.  She presented with mild delayed coughing well after last sip of her thin liquid.  Recommend continuing dysphagia 3, thin liquids with precautions. SLP will follow for safety/toleration.  Pt verbalized understanding.   HPI HPI: Pt is a 65 y.o. female with PMH of depression, anxiety, physical deconditioning, bipolar disorder, back/neck surgeries, and pacemaker placement. She presented from SNF with increased lethargy and sepsis secondary to pneumonia in setting of COVID-19. Pt testing positive for COVID-19 one week prior to admission. Pt was seen by [facility],  Dr. Felecia Shelling, on 02/08/20 and an outpt MBS was recommended. She had progressive generalized weakness as well as worsening swallow function; there were concerns of possible myasthenia gravis and she was scheduled for a neuro f/u in six months.  MBS completed during admission in December on 02/24/20: moderate oral dysphagia, delayed swallow initiation and decreased UES opening due to prior history of ACDF and presence of cervical fusion plate. Oral phase is adequate, but strength for negative oral pressure to draw in bolus appears impaired. Bolus cohesion also poor with long thin bolus, contributing to delayed swallow initaition. Aspiration noted prior to adequate  hyoid burst and laryngeal closure with consecutive or large boluses of thin liquids. A regular texture diet with individual sips of thin liquids via cup only was recommended at that time. CXR 1/29: Interval development of a trace left pleural effusion with  underlying infection/inflammation not excluded. Lower to mid lung zone vague hazy airspace opacity that could  represent atelectasis versus infection/inflammation.      SLP Plan  Continue with current plan of care       Recommendations  Diet recommendations: Dysphagia 3 (mechanical soft);Thin liquid Liquids provided via: Cup;no straw Medication Administration: Whole meds with puree Supervision: Staff to assist with self feeding;Patient able to self feed Compensations: Slow rate;Small sips/bites (individual sips only) Postural Changes and/or Swallow Maneuvers: Seated upright 90 degrees                Oral Care Recommendations: Oral care BID Follow up Recommendations: Skilled Nursing facility SLP Visit Diagnosis: Dysphagia, oropharyngeal phase (R13.12) Plan: Continue with current plan of care       GO                Juan Quam Laurice 04/19/2020, 3:40 PM  Cody Albus L. Tivis Ringer, [place] Office number [phone] Pager [phone]

## 2020-04-19 NOTE — Progress Notes (Signed)
PROGRESS NOTE                                                                             PROGRESS NOTE                                                                                                                                                                                                             Patient Demographics:    [name], is a 65 y.o. female, DOB - June 19, 1955, [id number]  Outpatient Primary MD for the patient is [name], [place], MD    LOS - 2  Admit date - 04/16/2020    Chief Complaint  Patient presents with  . Covid Positive       Brief Narrative     65 y.o. female with medical history significant for depression, anxiety, physical deconditioning, B12 deficiency, and reportedly positive for Covid about a week ago, recent ED visit secondary to progressive weakness, placed at subacute rehab from ED, now presenting from her SNF for evaluation of worsening lethargy for oral intake.  Patient reportedly tested positive for COVID-19 about 1 week ago, has been more lethargic at her SNF, not eating or drinking much, her work-up was significant for COVID-19 infection, did, but not boosted, and UTI as well.   Subjective:    Anari Lindsey today denies any chest pain or shortness of breath, have discussed with staff, she declined out of bed to chair yesterday, but this morning she has been sitting in the chair, appetite appears to be improving, she had good amount of her breakfast with encouragement .    Assessment  & Plan :    Principal Problem:   Sepsis due to pneumonia [facility]) Active Problems:   Depression with anxiety   B12 deficiency   Hypokalemia   COVID-19 virus infection   Physical deconditioning   Hypoglycemia without diagnosis of diabetes mellitus    COVID-19  infection -Patient reports she is vaccinated, but she does not recall if she received a booster - Presents from SNF with lethargy  and poor oral intake, is  reported to have tested positive for COVID ~1 wk - She is found to be tachycardic and tachypneic with leukocytosis to 14,300  -Continue with remdesivir -Has no hypoxia so I will hold on starting steroids  UTI -Urine culture growing Proteus mirabilis, treated with IV Rocephin, changed to Keflex.  Failure to thrive/severe deconditioning/protein calorie malnutrition -PT OT will be consulted. -Continue with IV fluids -Started on supplements -Nutrition is consulted -Continue with Remeron   Hypokalemia  - Repleted    Hypomagnesemia -Repleated  Hypophosphatemia -Repleted, monitor closely for refeeding syndrome  Hypoglycemia  - Serum glucose was normal in ED and she had a CBG of 47 that was taken at about the same time and may have been spurious   Deconditioning  - Continue PT    B12 deficiency  - Continue B12 supplementation    Depression, anxiety  - Resume Xanax at a lower dose, continue with Risperdal,  started on Lexapro.   SpO2: 97 %  Recent Labs  Lab 04/16/20 0003 04/17/20 0259 04/17/20 1135 04/18/20 0257 04/19/20 0131  WBC 14.3*  --  10.2 10.4 8.6  PLT 263  --  291 332 413*  CRP  --  16.6*  --  15.2* 11.7*  BNP  --  82.2  --   --   --   DDIMER  --  1.95*  --  2.02* 1.75*  PROCALCITON  --  <0.10  --   --   --   AST 19  --  19 15 13*  ALT 13  --  12 15 13   ALKPHOS 40  --  35* 41 40  BILITOT 1.8*  --  1.4* 1.2 1.0  ALBUMIN 2.7*  --  2.4* 2.3* 2.4*  INR  --  1.2  --   --   --   LATICACIDVEN  --  1.2  --   --   --        ABG     Component Value Date/Time   HCO3 24.3 03/22/2020 1910   TCO2 26 03/22/2020 1910   ACIDBASEDEF 2.0 03/22/2020 1910   O2SAT 54.0 03/22/2020 1910        Condition - Extremely Guarded  Family communication: Discussed with  daughter [name] by phone 2/1  Code Status :  full  Consults  :  none  Procedures  :  none   Status is: Inpatient  Remains inpatient appropriate because:IV treatments appropriate due to  intensity of illness or inability to take PO   Dispo: The patient is from: SNF              Anticipated d/c is to: SNF              Anticipated d/c date is: 2 days              Patient currently is not medically stable to d/c.  Feel he can be discharged to the nursing home in couple days once her oral intake has improved   Difficult to place patient No      DVT Prophylaxis  :  Lovenox   Lab Results  Component Value Date   PLT 413 (H) 04/19/2020    Diet :  Diet Order            DIET DYS 3 Room service appropriate? Yes with Assist; Fluid consistency: Thin  Diet effective now  Inpatient Medications  Scheduled Meds: . (feeding supplement) PROSource Plus  30 mL Oral BID BM  . cephALEXin  500 mg Oral Q8H  . enoxaparin (LOVENOX) injection  40 mg Subcutaneous Daily  . escitalopram  5 mg Oral QHS  . feeding supplement  237 mL Oral TID BM  . mirtazapine  7.5 mg Oral QHS  . phosphorus  500 mg Oral BID  . cyanocobalamin  1,000 mcg Oral Daily   Continuous Infusions: . remdesivir 100 mg in NS 100 mL 100 mg (04/19/20 0853)   PRN Meds:.acetaminophen, ALPRAZolam, ondansetron **OR** ondansetron (ZOFRAN) IV, senna-docusate  Antibiotics  :    Anti-infectives (From admission, onward)   Start     Dose/Rate Route Frequency Ordered Stop   04/19/20 2000  cephALEXin (KEFLEX) capsule 500 mg        500 mg Oral Every 8 hours 04/19/20 1036 04/21/20 0559   04/18/20 1000  remdesivir 100 mg in sodium chloride 0.9 % 100 mL IVPB       "Followed by" Linked Group Details   100 mg 200 mL/hr over 30 Minutes Intravenous Daily 04/17/20 0027 04/22/20 0959   04/17/20 2200  cefTRIAXone (ROCEPHIN) 1 g in sodium chloride 0.9 % 100 mL IVPB  Status:  Discontinued        1 g 200 mL/hr over 30 Minutes Intravenous Every 24 hours 04/17/20 0217 04/17/20 0234   04/17/20 0245  cefTRIAXone (ROCEPHIN) 2 g in sodium chloride 0.9 % 100 mL IVPB  Status:  Discontinued        2 g 200 mL/hr over 30  Minutes Intravenous Daily at bedtime 04/17/20 0235 04/19/20 1036   04/17/20 0245  azithromycin (ZITHROMAX) 500 mg in sodium chloride 0.9 % 250 mL IVPB  Status:  Discontinued        500 mg 250 mL/hr over 60 Minutes Intravenous Daily at bedtime 04/17/20 0235 04/18/20 1037   04/17/20 0200  remdesivir 200 mg in sodium chloride 0.9% 250 mL IVPB       "Followed by" Linked Group Details   200 mg 580 mL/hr over 30 Minutes Intravenous Once 04/17/20 0027 04/17/20 0424   04/17/20 0115  cefTRIAXone (ROCEPHIN) 1 g in sodium chloride 0.9 % 100 mL IVPB  Status:  Discontinued        1 g 200 mL/hr over 30 Minutes Intravenous  Once 04/17/20 0109 04/17/20 0236     Ivannah Zody M.D on 04/19/2020 at 11:54 AM  To page go to [email or web address]  [facility] -  Office  [phone]      Objective:   Vitals:   04/18/20 1953 04/18/20 1956 04/19/20 0623 04/19/20 0901  BP:  134/87 (!) 143/76   Pulse:  98    Resp:  18 20   Temp:  98.5 F (36.9 C) 98.5 F (36.9 C)   TempSrc:  Oral Oral   SpO2: 95% 94% 99% 97%  Weight:      Height:        Wt Readings from Last 3 Encounters:  04/16/20 53.1 kg  02/25/20 53.9 kg  02/08/20 59 kg     Intake/Output Summary (Last 24 hours) at 04/19/2020 1154 Last data filed at 04/19/2020 0934 Gross per 24 hour  Intake 120 ml  Output 700 ml  Net -580 ml     Physical Exam  Awake Alert, frail, deconditioned, with flat mood Symmetrical Chest wall movement, Good air movement bilaterally, CTAB RRR,No Gallops,Rubs or new Murmurs, No Parasternal Heave +ve B.Sounds, Abd Soft, No tenderness,  No rebound - guarding or rigidity. No Cyanosis, Clubbing or edema, No new Rash or bruise     Data Review:    CBC Recent Labs  Lab 04/16/20 0003 04/17/20 1135 04/18/20 0257 04/19/20 0131  WBC 14.3* 10.2 10.4 8.6  HGB 11.9* 11.4* 11.1* 11.1*  HCT 35.7* 35.4* 35.1* 34.9*  PLT 263 291 332 413*  MCV 87.1 87.4 88.2 87.9  MCH 29.0 28.1 27.9 28.0  MCHC 33.3 32.2 31.6 31.8   RDW 15.0 15.1 15.0 15.2  LYMPHSABS 1.2 1.0 1.2 1.5  MONOABS 0.9 0.8 0.8 1.0  EOSABS 0.0 0.0 0.0 0.1  BASOSABS 0.0 0.0 0.0 0.0    Recent Labs  Lab 04/16/20 0003 04/17/20 0259 04/17/20 1135 04/18/20 0257 04/19/20 0131  NA 137  --  137 139 142  K 2.9*  --  3.7 3.9 3.4*  CL 105  --  108 107 109  CO2 21*  --  20* 21* 24  GLUCOSE 95  --  97 84 85  BUN 8  --  6* <5* 11  CREATININE 0.78  --  0.54 0.67 0.59  CALCIUM 7.8*  --  7.4* 7.9* 8.0*  AST 19  --  19 15 13*  ALT 13  --  12 15 13   ALKPHOS 40  --  35* 41 40  BILITOT 1.8*  --  1.4* 1.2 1.0  ALBUMIN 2.7*  --  2.4* 2.3* 2.4*  MG  --  1.6*  --   --   --   CRP  --  16.6*  --  15.2* 11.7*  DDIMER  --  1.95*  --  2.02* 1.75*  PROCALCITON  --  <0.10  --   --   --   LATICACIDVEN  --  1.2  --   --   --   INR  --  1.2  --   --   --   HGBA1C  --  5.6  --   --   --   BNP  --  82.2  --   --   --     ------------------------------------------------------------------------------------------------------------------ No results for input(s): CHOL, HDL, LDLCALC, TRIG, CHOLHDL, LDLDIRECT in the last 72 hours.  Lab Results  Component Value Date   HGBA1C 5.6 04/17/2020   ------------------------------------------------------------------------------------------------------------------ No results for input(s): TSH, T4TOTAL, T3FREE, THYROIDAB in the last 72 hours.  Invalid input(s): FREET3  Cardiac Enzymes No results for input(s): CKMB, TROPONINI, MYOGLOBIN in the last 168 hours.  Invalid input(s): CK ------------------------------------------------------------------------------------------------------------------    Component Value Date/Time   BNP 82.2 04/17/2020 0259    Micro Results Recent Results (from the past 240 hour(s))  Culture, blood (x 2)     Status: None (Preliminary result)   Collection Time: 04/17/20  2:59 AM   Specimen: BLOOD  Result Value Ref Range Status   Specimen Description BLOOD RIGHT ARM  Final   Special  Requests   Final    BOTTLES DRAWN AEROBIC AND ANAEROBIC Blood Culture results may not be optimal due to an inadequate volume of blood received in culture bottles   Culture   Final    NO GROWTH 1 DAY Performed at [facility], [place] [address], [place], [place] [address]    Report Status PENDING  Incomplete  Culture, blood (x 2)     Status: None (Preliminary result)   Collection Time: 04/17/20  3:05 AM   Specimen: BLOOD  Result Value Ref Range Status   Specimen Description BLOOD LEFT ARM  Final  Special Requests   Final    BOTTLES DRAWN AEROBIC AND ANAEROBIC Blood Culture results may not be optimal due to an inadequate volume of blood received in culture bottles   Culture   Final    NO GROWTH 1 DAY Performed at [facility] [address] [place], [place] [address]    Report Status PENDING  Incomplete  Culture, Urine     Status: Abnormal   Collection Time: 04/17/20  9:46 AM   Specimen: Urine, Random  Result Value Ref Range Status   Specimen Description URINE, RANDOM  Final   Special Requests   Final    NONE Performed at [facility], [place] [address], [place], [place] [address]    Culture >=100,000 COLONIES/mL PROTEUS MIRABILIS (A)  Final   Report Status 04/19/2020 FINAL  Final   Organism ID, Bacteria PROTEUS MIRABILIS (A)  Final      Susceptibility   Proteus mirabilis - MIC*    AMPICILLIN >=32 RESISTANT Resistant     CEFAZOLIN 8 SENSITIVE Sensitive     CEFEPIME <=0.12 SENSITIVE Sensitive     CEFTRIAXONE <=0.25 SENSITIVE Sensitive     CIPROFLOXACIN <=0.25 SENSITIVE Sensitive     GENTAMICIN <=1 SENSITIVE Sensitive     IMIPENEM 1 SENSITIVE Sensitive     NITROFURANTOIN RESISTANT Resistant     TRIMETH/SULFA <=20 SENSITIVE Sensitive     AMPICILLIN/SULBACTAM >=32 RESISTANT Resistant     PIP/TAZO <=4 SENSITIVE Sensitive     * >=100,000 COLONIES/mL PROTEUS MIRABILIS  MRSA PCR Screening     Status: None   Collection Time: 04/17/20 10:45 PM   Specimen: Nasal  Mucosa; Nasopharyngeal  Result Value Ref Range Status   MRSA by PCR NEGATIVE NEGATIVE Final    Comment:        The GeneXpert MRSA Assay (FDA approved for NASAL specimens only), is one component of a comprehensive MRSA colonization surveillance program. It is not intended to diagnose MRSA infection nor to guide or monitor treatment for MRSA infections. Performed at [facility], [place] [address] [place], [place] [address]     Radiology Reports DG Chest 2 View  Result Date: 03/22/2020 CLINICAL DATA:  Approximately 15 falls in the past month.  Smoker. EXAM: CHEST - 2 VIEW COMPARISON:  02/21/2020 FINDINGS: Normal sized heart. Clear lungs. 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 Thoracic spine degenerative changes. No fracture or pneumothorax seen. IMPRESSION: No acute abnormality. Electronically Signed   By: Claudie Revering M.D.   On: 03/22/2020 12:53   CT HEAD WO CONTRAST  Result Date: 03/22/2020 CLINICAL DATA:  Frequent falls at home, including today with facial injury. Slow speech. Flat affect. EXAM: CT HEAD WITHOUT CONTRAST TECHNIQUE: Contiguous axial images were obtained from the base of the skull through the vertex without intravenous contrast. COMPARISON:  02/21/2020 head CT. FINDINGS: Brain: No evidence of parenchymal hemorrhage or extra-axial fluid collection. No mass lesion, mass effect, or midline shift. No CT evidence of acute infarction. Cerebral volume is age appropriate. No ventriculomegaly. Vascular: No acute abnormality. Skull: No evidence of calvarial fracture. Sinuses/Orbits: The visualized paranasal sinuses are essentially clear. Other:  The mastoid air cells are unopacified. IMPRESSION: Negative head CT. No evidence of acute intracranial abnormality. No evidence of calvarial fracture. Electronically Signed   By: Ilona Sorrel M.D.   On: 03/22/2020 19:49   DG Chest Port 1 View  Result Date: 04/17/2020 CLINICAL DATA:  Shortness of breath, COVID positive. EXAM:  PORTABLE CHEST 1 VIEW.  Patient is rotated. COMPARISON:  Chest x-ray 03/22/2020 FINDINGS: Left chest wall 2 lead cardiac pacemaker in stable position. The heart size and mediastinal contours are unchanged. Aortic arch calcification. Biapical pleural/pulmonary scarring. Lower to mid lung zone vague hazy airspace opacity. No pulmonary edema. Interval development of a trace left pleural effusion. No pneumothorax. No acute osseous abnormality. Partially visualized cervical spine hardware. IMPRESSION: 1. Interval development of a trace left pleural effusion with underlying infection/inflammation not excluded. 2. Lower to mid lung zone vague hazy airspace opacity that could represent atelectasis versus infection/inflammation. Also limited evaluation due to patient rotation. 3. Followup PA and lateral chest X-ray is recommended in 3-4 weeks following therapy to ensure resolution and exclude underlying malignancy. Electronically Signed   By: Iven Finn M.D.   On: 04/17/2020 00:17

## 2020-04-19 NOTE — Progress Notes (Addendum)
Initial Nutrition Assessment  DOCUMENTATION CODES:   Underweight  INTERVENTION:   Magic cup TID with meals, each supplement provides 290 kcal and 9 grams of protein  MVI with minerals daily  Continue 59ml Prosource Plus po BID, each supplement provides 100 kcals and 15 grams of protein  Continue Ensure Enlive po BID, each supplement provides 350 kcal and 20 grams of protein  NUTRITION DIAGNOSIS:   Inadequate oral intake related to poor appetite as evidenced by per patient/family report,meal completion < 50%.    GOAL:   Patient will meet greater than or equal to 90% of their needs    MONITOR:   PO intake,Supplement acceptance,Diet advancement,Weight trends,Labs,I & O's  REASON FOR ASSESSMENT:   Consult Assessment of nutrition requirement/status  ASSESSMENT:   Pt admitted with sepsis 2/2 PNA and COVID-19 (tested positive 1 week ago). PMH includes vertigo, anxiety/depression, bipolar disorder  Pt with poor po intake for ~1 week PTA. Pt states appetite has started to improve, but meal completion still inadequate to meet pt's needs. Two meal completions documented since admit -- pt ate 50% of lunch yesterday and 25% of breakfast today. Pt does have orders for 3ml Prosource Plus BID. Per RN, pt has been offered this supplement four times and has declined it once. Pt also with orders for Ensure TID which she has accepted 3/5 times it has been offered.    UOP: 1341ml x24 hours  Labs: K+ 3.4 (L) Medications: remeron, K Phos neutral, vitamin B12   NUTRITION - FOCUSED PHYSICAL EXAM:  Unable to perform at this time as RD is working remotely. Will attempt at follow-up.   Diet Order:   Diet Order            DIET DYS 3 Room service appropriate? Yes with Assist; Fluid consistency: Thin  Diet effective now                 EDUCATION NEEDS:   No education needs have been identified at this time  Skin:  Skin Assessment: Reviewed RN Assessment  Last BM:   PTA  Height:   Ht Readings from Last 1 Encounters:  04/16/20 5\' 7"  (1.702 m)    Weight:   Wt Readings from Last 1 Encounters:  04/16/20 53.1 kg   BMI:  Body mass index is 18.32 kg/m.  Estimated Nutritional Needs:   Kcal:  1600-1800  Protein:  80-90 grams  Fluid:  >1.6L/d    Larkin Ina, MS, RD, LDN RD pager number and weekend/on-call pager number located in [place].

## 2020-04-19 NOTE — Consult Note (Signed)
   [facility] Inpatient Consult   04/19/2020  [name] 21-Dec-1955 [id number]  [facility] [ACO] Patient: [name] Medicare  Patient screened for high score for unplanned readmission risk and for 3 hospitalizations in the past 6 months.  Review of patient's medical record reveals patient is the patient is being recommended for a skilled nursing level of care for transition.  Review of inpatient [facility] team reveals daughter would like a SNF in [place].  Plan:  No follow up needs assessed for [facility] Care Management is recommended at this time. Continue to follow progress and disposition to assess for post hospital care management needs.    For questions contact:   Natividad Brood, RN BSN [facility] Liaison  [phone] business mobile phone Toll free office [phone]  Fax number: [phone] [email or web address]

## 2020-04-20 LAB — CBC WITH DIFFERENTIAL/PLATELET
Abs Immature Granulocytes: 0.03 10*3/uL (ref 0.00–0.07)
Basophils Absolute: 0 10*3/uL (ref 0.0–0.1)
Basophils Relative: 0 %
Eosinophils Absolute: 0.2 10*3/uL (ref 0.0–0.5)
Eosinophils Relative: 2 %
HCT: 37.2 % (ref 36.0–46.0)
Hemoglobin: 11.9 g/dL — ABNORMAL LOW (ref 12.0–15.0)
Immature Granulocytes: 0 %
Lymphocytes Relative: 20 %
Lymphs Abs: 1.4 10*3/uL (ref 0.7–4.0)
MCH: 27.9 pg (ref 26.0–34.0)
MCHC: 32 g/dL (ref 30.0–36.0)
MCV: 87.3 fL (ref 80.0–100.0)
Monocytes Absolute: 0.9 10*3/uL (ref 0.1–1.0)
Monocytes Relative: 12 %
Neutro Abs: 4.8 10*3/uL (ref 1.7–7.7)
Neutrophils Relative %: 66 %
Platelets: 439 10*3/uL — ABNORMAL HIGH (ref 150–400)
RBC: 4.26 MIL/uL (ref 3.87–5.11)
RDW: 15.1 % (ref 11.5–15.5)
WBC: 7.3 10*3/uL (ref 4.0–10.5)
nRBC: 0 % (ref 0.0–0.2)

## 2020-04-20 LAB — COMPREHENSIVE METABOLIC PANEL
ALT: 11 U/L (ref 0–44)
AST: 12 U/L — ABNORMAL LOW (ref 15–41)
Albumin: 2.6 g/dL — ABNORMAL LOW (ref 3.5–5.0)
Alkaline Phosphatase: 40 U/L (ref 38–126)
Anion gap: 10 (ref 5–15)
BUN: 11 mg/dL (ref 8–23)
CO2: 24 mmol/L (ref 22–32)
Calcium: 8.5 mg/dL — ABNORMAL LOW (ref 8.9–10.3)
Chloride: 106 mmol/L (ref 98–111)
Creatinine, Ser: 0.53 mg/dL (ref 0.44–1.00)
GFR, Estimated: >60 mL/min (ref >60)
Glucose, Bld: 100 mg/dL — ABNORMAL HIGH (ref 70–99)
Potassium: 3.8 mmol/L (ref 3.5–5.1)
Sodium: 140 mmol/L (ref 135–145)
Total Bilirubin: 0.9 mg/dL (ref 0.3–1.2)
Total Protein: 5.3 g/dL — ABNORMAL LOW (ref 6.5–8.1)

## 2020-04-20 LAB — C-REACTIVE PROTEIN: CRP: 9.7 mg/dL — ABNORMAL HIGH (ref <1.0)

## 2020-04-20 LAB — D-DIMER, QUANTITATIVE: D-Dimer, Quant: 1.53 ug/mL-FEU — ABNORMAL HIGH (ref 0.00–0.50)

## 2020-04-20 LAB — MAGNESIUM: Magnesium: 2 mg/dL (ref 1.7–2.4)

## 2020-04-20 LAB — PHOSPHORUS: Phosphorus: 3.2 mg/dL (ref 2.5–4.6)

## 2020-04-20 MED ORDER — RISPERIDONE 0.5 MG PO TABS
3.0000 mg | ORAL_TABLET | Freq: Every day | ORAL | Status: DC
  Administered 2020-04-20: 3 mg via ORAL
  Filled 2020-04-20: qty 6

## 2020-04-20 NOTE — Progress Notes (Signed)
Physical Therapy Treatment Patient Details Name: [name] MRN: [id number] DOB: July 05, 1955 Today's Date: 04/20/2020    History of Present Illness Pt is a 65 y.o. F with significant PMH of depression, anxiety, physical deconditioning, bipolar disorder, back/neck surgeries, and pacemaker placement who presents from SNF with increased lethargy and sepsis secondary to pneumonia in setting of COVID-19. Pt testing positive for COVID-19 one week ago.    PT Comments    Pt is alert and more participatory today, however she is still disoriented to time and situation. Pt is limited in safe mobility by decreased cognition in particular slowed processing and decreased safety awareness. Pt also has a marked increase in HR to mid 130s from 100s with standing. HR increase is sustained in mid 130s until she sits back down. HR remains in 110s with seated exercise. Pt requires D/c plans remain appropriate. PT will continue to follow acutely.    Follow Up Recommendations  SNF     Equipment Recommendations  None recommended by PT       Precautions / Restrictions Precautions Precautions: Fall Restrictions Weight Bearing Restrictions: No    Mobility  Bed Mobility               General bed mobility comments: OOB in recliner on entry  Transfers Overall transfer level: Needs assistance Equipment used: Rolling walker (2 wheeled) Transfers: Sit to/from Stand Sit to Stand: Max assist         General transfer comment: maxA for power up and steadying in standing with RW, requires increased time and cuing for safe power up        Balance Overall balance assessment: Needs assistance Sitting-balance support: Feet supported;No upper extremity supported Sitting balance-Leahy Scale: Fair Sitting balance - Comments: able to sit at the edge of the chair without assist prior to standing   Standing balance support: Bilateral upper extremity supported Standing balance-Leahy Scale: Poor Standing  balance comment: requires UE support for standing                            Cognition Arousal/Alertness: Awake/alert Behavior During Therapy: Flat affect Overall Cognitive Status: Impaired/Different from baseline Area of Impairment: Orientation;Following commands;Problem solving                 Orientation Level: Disoriented to;Situation;Time     Following Commands: Follows one step commands inconsistently;Follows multi-step commands inconsistently     Problem Solving: Slow processing General Comments: pt is more participatory today however requires increased multimodal cuing and time for processing commands      Exercises General Exercises - Lower Extremity Ankle Circles/Pumps: AROM;Both;10 reps;Seated Long Arc Quad: Both;10 reps;AROM;Seated Hip ABduction/ADduction: AROM;Both;10 reps;Seated Hip Flexion/Marching: AROM;10 reps;Standing Other Exercises Other Exercises: stepping forward 2 steps and back 2 steps x3    General Comments General comments (skin integrity, edema, etc.): at rest on RA HR 109bpm , SaO2 94%O2, with standing exercies HR 135bpm, SaO2 89%O2      Pertinent Vitals/Pain Pain Assessment: No/denies pain Faces Pain Scale: No hurt           PT Goals (current goals can now be found in the care plan section) Acute Rehab PT Goals Patient Stated Goal: did not state PT Goal Formulation: With patient Time For Goal Achievement: 05/01/20 Potential to Achieve Goals: Fair    Frequency    Min 2X/week      PT Plan Current plan remains appropriate  AM-PAC PT "6 Clicks" Mobility   Outcome Measure  Help needed turning from your back to your side while in a flat bed without using bedrails?: A Little Help needed moving from lying on your back to sitting on the side of a flat bed without using bedrails?: A Lot Help needed moving to and from a bed to a chair (including a wheelchair)?: A Lot Help needed standing up from a chair using  your arms (e.g., wheelchair or bedside chair)?: A Lot Help needed to walk in hospital room?: A Lot Help needed climbing 3-5 steps with a railing? : Total 6 Click Score: 12    End of Session   Activity Tolerance: Treatment limited secondary to medical complications (Comment) (increased HR) Patient left: with call bell/phone within reach;in chair;with chair alarm set Nurse Communication: Mobility status PT Visit Diagnosis: Muscle weakness (generalized) (M62.81);Other abnormalities of gait and mobility (R26.89);History of falling (Z91.81)     Time: 1010-1029 PT Time Calculation (min) (ACUTE ONLY): 19 min  Charges:  $Therapeutic Exercise: 8-22 mins                     Ahley Bulls B. Migdalia Dk PT, DPT Acute Rehabilitation Services Pager [phone] Office [phone]    [place] 04/20/2020, 10:51 AM

## 2020-04-20 NOTE — Progress Notes (Signed)
PROGRESS NOTE                                                                                                                                                                                                             Patient Demographics:    [name], is a 65 y.o. female, DOB - Apr 01, 1955, IRJ:[id number]  Outpatient Primary MD for the patient is Lujean Amel, MD   Admit date - 04/16/2020   LOS - 3  Chief Complaint  Patient presents with  . Covid Positive       Brief Narrative: Patient is a 65 y.o. female with PMHx of depression/anxiety, physical deconditioning-who was transferred from SNF for evaluation of progressive weakness.  Patient was apparently Covid +1-week prior to this hospitalization.   COVID-19 vaccinated status: Vaccinated  Significant Events: 1/4>> evaluated in ED for weakness/frequent falls-transfer to SNF 1/29>> admit to [facility] for progressive weakness/lethargy-poor oral intake.  Covid positive.  Significant studies: 1/4>> chest x-ray: Trace left pleural effusion, lower/midlung vague airspace opacity  COVID-19 medications: Remdesivir: 1/29>> 2/2  Antibiotics: Rocephin: 1/29>> 1/31 Keflex: 2/1>>  Microbiology data: 1/30>> blood culture: Negative 1/30>> urine culture: Proteus  Procedures: None  Consults: None  DVT prophylaxis: enoxaparin (LOVENOX) injection 40 mg Start: 04/17/20 1000   Subjective:    [name] today feels much better-no chest pain or shortness of breath.  Eating breakfast this morning.   Assessment  & Plan :   COVID-19 pneumonia: Afebrile-not hypoxic-has completed IV Remdesivir.  Fever: afebrile O2 requirements:  SpO2: 93 %   COVID-19 Labs: Recent Labs    04/18/20 0257 04/19/20 0131 04/20/20 0242  DDIMER 2.02* 1.75* 1.53*  CRP 15.2* 11.7* 9.7*       Component Value Date/Time   BNP 82.2 04/17/2020 0259    Recent Labs  Lab 04/17/20 0259   PROCALCITON <0.10    Lab Results  Component Value Date   SARSCOV2NAA NEGATIVE 03/23/2020   SARSCOV2NAA NEGATIVE 02/26/2020   SARSCOV2NAA NEGATIVE 02/21/2020   [place] NEGATIVE 12/17/2019    UTI-Proteus mirabilis: On Keflex-suspect requires around 5 days of treatment.  Generalized deconditioning/failure to thrive/protein calorie malnutrition: Stable-continue PT/OT-plan to discharge to SNF when bed available.  Vitamin B12 deficiency: Continue supplementation  Depression/anxiety: Stable-continue Risperdal, Remeron and as needed Xanax.  Nutrition Problem: Nutrition Problem: Inadequate oral intake Etiology: poor appetite Signs/Symptoms: per  patient/family report,meal completion < 50% Interventions: Ensure Enlive (each supplement provides 350kcal and 20 grams of protein),MVI,Magic cup   ABG:    Component Value Date/Time   HCO3 24.3 03/22/2020 1910   TCO2 26 03/22/2020 1910   ACIDBASEDEF 2.0 03/22/2020 1910   O2SAT 54.0 03/22/2020 1910    Vent Settings: N/A  Condition - Stable  Family Communication  :  Daughter-[name]-updated on 2/2  Code Status :  Full Code  Diet :  Diet Order            DIET DYS 3 Room service appropriate? Yes with Assist; Fluid consistency: Thin  Diet effective now                  Disposition Plan  :   Status is: Inpatient  Remains inpatient appropriate because:Inpatient level of care appropriate due to severity of illness   Dispo: The patient is from: SNF              Anticipated d/c is to: SNF              Anticipated d/c date is: 1 day              Patient currently is medically stable to d/c.   Difficult to place patient No    Barriers to discharge: awaiting SNF bed  Antimicorbials  :    Anti-infectives (From admission, onward)   Start     Dose/Rate Route Frequency Ordered Stop   04/19/20 2000  cephALEXin (KEFLEX) capsule 500 mg        500 mg Oral Every 8 hours 04/19/20 1036 04/21/20 0559   04/18/20 1000   remdesivir 100 mg in sodium chloride 0.9 % 100 mL IVPB       "Followed by" Linked Group Details   100 mg 200 mL/hr over 30 Minutes Intravenous Daily 04/17/20 0027 04/20/20 1212   04/17/20 2200  cefTRIAXone (ROCEPHIN) 1 g in sodium chloride 0.9 % 100 mL IVPB  Status:  Discontinued        1 g 200 mL/hr over 30 Minutes Intravenous Every 24 hours 04/17/20 0217 04/17/20 0234   04/17/20 0245  cefTRIAXone (ROCEPHIN) 2 g in sodium chloride 0.9 % 100 mL IVPB  Status:  Discontinued        2 g 200 mL/hr over 30 Minutes Intravenous Daily at bedtime 04/17/20 0235 04/19/20 1036   04/17/20 0245  azithromycin (ZITHROMAX) 500 mg in sodium chloride 0.9 % 250 mL IVPB  Status:  Discontinued        500 mg 250 mL/hr over 60 Minutes Intravenous Daily at bedtime 04/17/20 0235 04/18/20 1037   04/17/20 0200  remdesivir 200 mg in sodium chloride 0.9% 250 mL IVPB       "Followed by" Linked Group Details   200 mg 580 mL/hr over 30 Minutes Intravenous Once 04/17/20 0027 04/17/20 0424   04/17/20 0115  cefTRIAXone (ROCEPHIN) 1 g in sodium chloride 0.9 % 100 mL IVPB  Status:  Discontinued        1 g 200 mL/hr over 30 Minutes Intravenous  Once 04/17/20 0109 04/17/20 0236      Inpatient Medications  Scheduled Meds: . (feeding supplement) PROSource Plus  30 mL Oral BID BM  . cephALEXin  500 mg Oral Q8H  . enoxaparin (LOVENOX) injection  40 mg Subcutaneous Daily  . escitalopram  5 mg Oral QHS  . feeding supplement  237 mL Oral TID BM  . mirtazapine  7.5 mg Oral  QHS  . multivitamin with minerals  1 tablet Oral Daily  . phosphorus  500 mg Oral BID  . cyanocobalamin  1,000 mcg Oral Daily   Continuous Infusions: PRN Meds:.acetaminophen, ALPRAZolam, ondansetron **OR** ondansetron (ZOFRAN) IV, senna-docusate   Time Spent in minutes  25  See all Orders from today for further details   Oren Binet M.D on 04/20/2020 at 12:30 PM  To page go to [email or web address] - use universal password  [facility] -  Office   [phone]    Objective:   Vitals:   04/19/20 0901 04/19/20 1324 04/20/20 0000 04/20/20 0529  BP:  123/88 126/77 (!) 141/67  Pulse:  100 93 75  Resp:  20 16 18   Temp:  98.3 F (36.8 C) 98.8 F (37.1 C) 97.6 F (36.4 C)  TempSrc:  Oral Oral Axillary  SpO2: 97% 91% 92% 93%  Weight:      Height:        Wt Readings from Last 3 Encounters:  04/16/20 53.1 kg  02/25/20 53.9 kg  02/08/20 59 kg     Intake/Output Summary (Last 24 hours) at 04/20/2020 1230 Last data filed at 04/20/2020 0900 Gross per 24 hour  Intake 120 ml  Output 400 ml  Net -280 ml     Physical Exam Gen Exam:Alert awake-not in any distress HEENT:atraumatic, normocephalic Chest: B/L clear to auscultation anteriorly CVS:S1S2 regular Abdomen:soft non tender, non distended Extremities:no edema Neurology: Non focal-but w gen weakness Skin: no rash   Data Review:    CBC Recent Labs  Lab 04/16/20 0003 04/17/20 1135 04/18/20 0257 04/19/20 0131 04/20/20 0242  WBC 14.3* 10.2 10.4 8.6 7.3  HGB 11.9* 11.4* 11.1* 11.1* 11.9*  HCT 35.7* 35.4* 35.1* 34.9* 37.2  PLT 263 291 332 413* 439*  MCV 87.1 87.4 88.2 87.9 87.3  MCH 29.0 28.1 27.9 28.0 27.9  MCHC 33.3 32.2 31.6 31.8 32.0  RDW 15.0 15.1 15.0 15.2 15.1  LYMPHSABS 1.2 1.0 1.2 1.5 1.4  MONOABS 0.9 0.8 0.8 1.0 0.9  EOSABS 0.0 0.0 0.0 0.1 0.2  BASOSABS 0.0 0.0 0.0 0.0 0.0    Chemistries  Recent Labs  Lab 04/16/20 0003 04/17/20 0259 04/17/20 1135 04/18/20 0257 04/19/20 0131 04/20/20 0242  NA 137  --  137 139 142 140  K 2.9*  --  3.7 3.9 3.4* 3.8  CL 105  --  108 107 109 106  CO2 21*  --  20* 21* 24 24  GLUCOSE 95  --  97 84 85 100*  BUN 8  --  6* <5* 11 11  CREATININE 0.78  --  0.54 0.67 0.59 0.53  CALCIUM 7.8*  --  7.4* 7.9* 8.0* 8.5*  MG  --  1.6*  --   --   --  2.0  AST 19  --  19 15 13* 12*  ALT 13  --  12 15 13 11   ALKPHOS 40  --  35* 41 40 40  BILITOT 1.8*  --  1.4* 1.2 1.0 0.9    ------------------------------------------------------------------------------------------------------------------ No results for input(s): CHOL, HDL, LDLCALC, TRIG, CHOLHDL, LDLDIRECT in the last 72 hours.  Lab Results  Component Value Date   HGBA1C 5.6 04/17/2020   ------------------------------------------------------------------------------------------------------------------ No results for input(s): TSH, T4TOTAL, T3FREE, THYROIDAB in the last 72 hours.  Invalid input(s): FREET3 ------------------------------------------------------------------------------------------------------------------ No results for input(s): VITAMINB12, FOLATE, FERRITIN, TIBC, IRON, RETICCTPCT in the last 72 hours.  Coagulation profile Recent Labs  Lab 04/17/20 0259  INR 1.2    Recent  Labs    04/19/20 0131 04/20/20 0242  DDIMER 1.75* 1.53*    Cardiac Enzymes No results for input(s): CKMB, TROPONINI, MYOGLOBIN in the last 168 hours.  Invalid input(s): CK ------------------------------------------------------------------------------------------------------------------    Component Value Date/Time   BNP 82.2 04/17/2020 0259    Micro Results Recent Results (from the past 240 hour(s))  Culture, blood (x 2)     Status: None (Preliminary result)   Collection Time: 04/17/20  2:59 AM   Specimen: BLOOD  Result Value Ref Range Status   Specimen Description BLOOD RIGHT ARM  Final   Special Requests   Final    BOTTLES DRAWN AEROBIC AND ANAEROBIC Blood Culture results may not be optimal due to an inadequate volume of blood received in culture bottles   Culture   Final    NO GROWTH 3 DAYS Performed at [facility], [place] [address], [place], [place] [address]    Report Status PENDING  Incomplete  Culture, blood (x 2)     Status: None (Preliminary result)   Collection Time: 04/17/20  3:05 AM   Specimen: BLOOD  Result Value Ref Range Status   Specimen Description BLOOD LEFT ARM  Final    Special Requests   Final    BOTTLES DRAWN AEROBIC AND ANAEROBIC Blood Culture results may not be optimal due to an inadequate volume of blood received in culture bottles   Culture   Final    NO GROWTH 3 DAYS Performed at [facility], [place] [address] [place], [place] [address]    Report Status PENDING  Incomplete  Culture, Urine     Status: Abnormal   Collection Time: 04/17/20  9:46 AM   Specimen: Urine, Random  Result Value Ref Range Status   Specimen Description URINE, RANDOM  Final   Special Requests   Final    NONE Performed at [facility], [place] [address], [place], [place] [address]    Culture >=100,000 COLONIES/mL PROTEUS MIRABILIS (A)  Final   Report Status 04/19/2020 FINAL  Final   Organism ID, Bacteria PROTEUS MIRABILIS (A)  Final      Susceptibility   Proteus mirabilis - MIC*    AMPICILLIN >=32 RESISTANT Resistant     CEFAZOLIN 8 SENSITIVE Sensitive     CEFEPIME <=0.12 SENSITIVE Sensitive     CEFTRIAXONE <=0.25 SENSITIVE Sensitive     CIPROFLOXACIN <=0.25 SENSITIVE Sensitive     GENTAMICIN <=1 SENSITIVE Sensitive     IMIPENEM 1 SENSITIVE Sensitive     NITROFURANTOIN RESISTANT Resistant     TRIMETH/SULFA <=20 SENSITIVE Sensitive     AMPICILLIN/SULBACTAM >=32 RESISTANT Resistant     PIP/TAZO <=4 SENSITIVE Sensitive     * >=100,000 COLONIES/mL PROTEUS MIRABILIS  MRSA PCR Screening     Status: None   Collection Time: 04/17/20 10:45 PM   Specimen: Nasal Mucosa; Nasopharyngeal  Result Value Ref Range Status   MRSA by PCR NEGATIVE NEGATIVE Final    Comment:        The GeneXpert MRSA Assay (FDA approved for NASAL specimens only), is one component of a comprehensive MRSA colonization surveillance program. It is not intended to diagnose MRSA infection nor to guide or monitor treatment for MRSA infections. Performed at [facility], [place] [address] [place], [place] [address]     Radiology Reports DG Chest 2 View  Result Date: 03/22/2020 CLINICAL  DATA:  Approximately 15 falls in the past month.  Smoker. EXAM: CHEST - 2 VIEW COMPARISON:  02/21/2020 FINDINGS: Normal sized heart. Clear lungs. 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 Thoracic spine degenerative changes. No fracture or pneumothorax seen. IMPRESSION: No acute abnormality. Electronically Signed   By: Claudie Revering M.D.   On: 03/22/2020 12:53   CT HEAD WO CONTRAST  Result Date: 03/22/2020 CLINICAL DATA:  Frequent falls at home, including today with facial injury. Slow speech. Flat affect. EXAM: CT HEAD WITHOUT CONTRAST TECHNIQUE: Contiguous axial images were obtained from the base of the skull through the vertex without intravenous contrast. COMPARISON:  02/21/2020 head CT. FINDINGS: Brain: No evidence of parenchymal hemorrhage or extra-axial fluid collection. No mass lesion, mass effect, or midline shift. No CT evidence of acute infarction. Cerebral volume is age appropriate. No ventriculomegaly. Vascular: No acute abnormality. Skull: No evidence of calvarial fracture. Sinuses/Orbits: The visualized paranasal sinuses are essentially clear. Other:  The mastoid air cells are unopacified. IMPRESSION: Negative head CT. No evidence of acute intracranial abnormality. No evidence of calvarial fracture. Electronically Signed   By: Ilona Sorrel M.D.   On: 03/22/2020 19:49   DG Chest Port 1 View  Result Date: 04/17/2020 CLINICAL DATA:  Shortness of breath, COVID positive. EXAM: PORTABLE CHEST 1 VIEW.  Patient is rotated. COMPARISON:  Chest x-ray 03/22/2020 FINDINGS: Left chest wall 2 lead cardiac pacemaker in stable position. The heart size and mediastinal contours are unchanged. Aortic arch calcification. Biapical pleural/pulmonary scarring. Lower to mid lung zone vague hazy airspace opacity. No pulmonary edema. Interval development of a trace left pleural effusion. No pneumothorax. No acute osseous abnormality. Partially visualized cervical spine hardware. IMPRESSION: 1. Interval  development of a trace left pleural effusion with underlying infection/inflammation not excluded. 2. Lower to mid lung zone vague hazy airspace opacity that could represent atelectasis versus infection/inflammation. Also limited evaluation due to patient rotation. 3. Followup PA and lateral chest X-ray is recommended in 3-4 weeks following therapy to ensure resolution and exclude underlying malignancy. Electronically Signed   By: Iven Finn M.D.   On: 04/17/2020 00:17

## 2020-04-20 NOTE — Plan of Care (Signed)
  Problem: Education: Goal: Knowledge of General Education information will improve Description: Including pain rating scale, medication(s)/side effects and non-pharmacologic comfort measures Outcome: Progressing   Problem: Health Behavior/Discharge Planning: Goal: Ability to manage health-related needs will improve Outcome: Progressing   Problem: Clinical Measurements: Goal: Will remain free from infection Outcome: Progressing   Problem: Activity: Goal: Risk for activity intolerance will decrease Outcome: Progressing   Problem: Nutrition: Goal: Adequate nutrition will be maintained Outcome: Progressing   Problem: Education: Goal: Knowledge of risk factors and measures for prevention of condition will improve Outcome: Progressing   Problem: Respiratory: Goal: Will maintain a patent airway Outcome: Progressing

## 2020-04-20 NOTE — TOC Progression Note (Addendum)
Transition of Care [facility]) - Progression Note    Patient Details  Name: [name] MRN: [id number] Date of Birth: 1955/08/26  Transition of Care [facility]) CM/SW Progress, LCSW Phone Number: 04/20/2020, 3:11 PM  Clinical Narrative:    [employer] screen completed with CM. She will send paperwork to [employer]. Hopeful for dc tomorrow. Patient's daughter, [name], aware that the facility in [place] is unable to accept patient. CSW explained that there are no other facilities available at this time that will accept a COVID + patient under Medicaid/[employer] and that patient will return to [place] while family continue to research other facilities.    Expected Discharge Plan: [place] Barriers to Discharge: Insurance Authorization,Continued Medical Work up  Expected Discharge Plan and Services Expected Discharge Plan: [place] In-house Referral: Clinical Social Work     Living arrangements for the past 2 months: [place]                                       Social Determinants of Health (SDOH) Interventions    Readmission Risk Interventions No flowsheet data found.

## 2020-04-21 ENCOUNTER — Institutional Professional Consult (permissible substitution): Payer: Medicare HMO | Admitting: Emergency Medicine

## 2020-04-21 DIAGNOSIS — A4189 Other specified sepsis: Secondary | ICD-10-CM | POA: Diagnosis not present

## 2020-04-21 DIAGNOSIS — F411 Generalized anxiety disorder: Secondary | ICD-10-CM | POA: Diagnosis not present

## 2020-04-21 DIAGNOSIS — M2550 Pain in unspecified joint: Secondary | ICD-10-CM | POA: Diagnosis not present

## 2020-04-21 DIAGNOSIS — U071 COVID-19: Secondary | ICD-10-CM | POA: Diagnosis not present

## 2020-04-21 DIAGNOSIS — R6520 Severe sepsis without septic shock: Secondary | ICD-10-CM | POA: Diagnosis not present

## 2020-04-21 DIAGNOSIS — J189 Pneumonia, unspecified organism: Secondary | ICD-10-CM | POA: Diagnosis not present

## 2020-04-21 DIAGNOSIS — J1282 Pneumonia due to coronavirus disease 2019: Secondary | ICD-10-CM | POA: Diagnosis not present

## 2020-04-21 DIAGNOSIS — R531 Weakness: Secondary | ICD-10-CM | POA: Diagnosis not present

## 2020-04-21 DIAGNOSIS — Z8739 Personal history of other diseases of the musculoskeletal system and connective tissue: Secondary | ICD-10-CM | POA: Diagnosis not present

## 2020-04-21 DIAGNOSIS — E876 Hypokalemia: Secondary | ICD-10-CM | POA: Diagnosis not present

## 2020-04-21 DIAGNOSIS — N3000 Acute cystitis without hematuria: Secondary | ICD-10-CM | POA: Diagnosis not present

## 2020-04-21 DIAGNOSIS — U099 Post covid-19 condition, unspecified: Secondary | ICD-10-CM | POA: Diagnosis not present

## 2020-04-21 DIAGNOSIS — F331 Major depressive disorder, recurrent, moderate: Secondary | ICD-10-CM | POA: Diagnosis not present

## 2020-04-21 DIAGNOSIS — Z8616 Personal history of COVID-19: Secondary | ICD-10-CM | POA: Diagnosis not present

## 2020-04-21 DIAGNOSIS — A419 Sepsis, unspecified organism: Secondary | ICD-10-CM | POA: Diagnosis not present

## 2020-04-21 DIAGNOSIS — R5381 Other malaise: Secondary | ICD-10-CM | POA: Diagnosis not present

## 2020-04-21 DIAGNOSIS — D6489 Other specified anemias: Secondary | ICD-10-CM | POA: Diagnosis not present

## 2020-04-21 DIAGNOSIS — G478 Other sleep disorders: Secondary | ICD-10-CM | POA: Diagnosis not present

## 2020-04-21 DIAGNOSIS — B964 Proteus (mirabilis) (morganii) as the cause of diseases classified elsewhere: Secondary | ICD-10-CM | POA: Diagnosis not present

## 2020-04-21 DIAGNOSIS — R627 Adult failure to thrive: Secondary | ICD-10-CM | POA: Diagnosis not present

## 2020-04-21 DIAGNOSIS — F418 Other specified anxiety disorders: Secondary | ICD-10-CM | POA: Diagnosis not present

## 2020-04-21 DIAGNOSIS — E46 Unspecified protein-calorie malnutrition: Secondary | ICD-10-CM | POA: Diagnosis not present

## 2020-04-21 DIAGNOSIS — Z743 Need for continuous supervision: Secondary | ICD-10-CM | POA: Diagnosis not present

## 2020-04-21 DIAGNOSIS — M6281 Muscle weakness (generalized): Secondary | ICD-10-CM | POA: Diagnosis not present

## 2020-04-21 DIAGNOSIS — Z950 Presence of cardiac pacemaker: Secondary | ICD-10-CM | POA: Diagnosis not present

## 2020-04-21 DIAGNOSIS — Z681 Body mass index (BMI) 19 or less, adult: Secondary | ICD-10-CM | POA: Diagnosis not present

## 2020-04-21 DIAGNOSIS — N390 Urinary tract infection, site not specified: Secondary | ICD-10-CM | POA: Diagnosis not present

## 2020-04-21 DIAGNOSIS — D513 Other dietary vitamin B12 deficiency anemia: Secondary | ICD-10-CM | POA: Diagnosis not present

## 2020-04-21 DIAGNOSIS — Z7401 Bed confinement status: Secondary | ICD-10-CM | POA: Diagnosis not present

## 2020-04-21 DIAGNOSIS — R69 Illness, unspecified: Secondary | ICD-10-CM | POA: Diagnosis not present

## 2020-04-21 DIAGNOSIS — E538 Deficiency of other specified B group vitamins: Secondary | ICD-10-CM | POA: Diagnosis not present

## 2020-04-21 DIAGNOSIS — E162 Hypoglycemia, unspecified: Secondary | ICD-10-CM | POA: Diagnosis not present

## 2020-04-21 LAB — CBC WITH DIFFERENTIAL/PLATELET
Abs Immature Granulocytes: 0.03 10*3/uL (ref 0.00–0.07)
Basophils Absolute: 0 10*3/uL (ref 0.0–0.1)
Basophils Relative: 0 %
Eosinophils Absolute: 0.1 10*3/uL (ref 0.0–0.5)
Eosinophils Relative: 2 %
HCT: 32.1 % — ABNORMAL LOW (ref 36.0–46.0)
Hemoglobin: 10.8 g/dL — ABNORMAL LOW (ref 12.0–15.0)
Immature Granulocytes: 0 %
Lymphocytes Relative: 27 %
Lymphs Abs: 2.1 10*3/uL (ref 0.7–4.0)
MCH: 29 pg (ref 26.0–34.0)
MCHC: 33.6 g/dL (ref 30.0–36.0)
MCV: 86.1 fL (ref 80.0–100.0)
Monocytes Absolute: 0.9 10*3/uL (ref 0.1–1.0)
Monocytes Relative: 12 %
Neutro Abs: 4.4 10*3/uL (ref 1.7–7.7)
Neutrophils Relative %: 59 %
Platelets: 400 10*3/uL (ref 150–400)
RBC: 3.73 MIL/uL — ABNORMAL LOW (ref 3.87–5.11)
RDW: 15.3 % (ref 11.5–15.5)
WBC: 7.6 10*3/uL (ref 4.0–10.5)
nRBC: 0 % (ref 0.0–0.2)

## 2020-04-21 LAB — COMPREHENSIVE METABOLIC PANEL
ALT: 10 U/L (ref 0–44)
AST: 12 U/L — ABNORMAL LOW (ref 15–41)
Albumin: 2.3 g/dL — ABNORMAL LOW (ref 3.5–5.0)
Alkaline Phosphatase: 39 U/L (ref 38–126)
Anion gap: 10 (ref 5–15)
BUN: 8 mg/dL (ref 8–23)
CO2: 27 mmol/L (ref 22–32)
Calcium: 8.1 mg/dL — ABNORMAL LOW (ref 8.9–10.3)
Chloride: 104 mmol/L (ref 98–111)
Creatinine, Ser: 0.52 mg/dL (ref 0.44–1.00)
GFR, Estimated: >60 mL/min (ref >60)
Glucose, Bld: 112 mg/dL — ABNORMAL HIGH (ref 70–99)
Potassium: 3.2 mmol/L — ABNORMAL LOW (ref 3.5–5.1)
Sodium: 141 mmol/L (ref 135–145)
Total Bilirubin: 0.7 mg/dL (ref 0.3–1.2)
Total Protein: 4.9 g/dL — ABNORMAL LOW (ref 6.5–8.1)

## 2020-04-21 LAB — C-REACTIVE PROTEIN: CRP: 6 mg/dL — ABNORMAL HIGH (ref <1.0)

## 2020-04-21 LAB — D-DIMER, QUANTITATIVE: D-Dimer, Quant: 1.12 ug/mL-FEU — ABNORMAL HIGH (ref 0.00–0.50)

## 2020-04-21 MED ORDER — SODIUM CHLORIDE 0.9 % IV BOLUS
500.0000 mL | Freq: Once | INTRAVENOUS | Status: AC
  Administered 2020-04-21: 500 mL via INTRAVENOUS

## 2020-04-21 MED ORDER — PROSOURCE PLUS PO LIQD: 30.0000 mL | Freq: Two times a day (BID) | ORAL | Status: DC

## 2020-04-21 MED ORDER — ENSURE ENLIVE PO LIQD: 237.0000 mL | Freq: Three times a day (TID) | ORAL | 12 refills | Status: DC

## 2020-04-21 MED ORDER — POTASSIUM CHLORIDE CRYS ER 20 MEQ PO TBCR
40.0000 meq | EXTENDED_RELEASE_TABLET | Freq: Once | ORAL | Status: AC
  Administered 2020-04-21: 40 meq via ORAL
  Filled 2020-04-21: qty 2

## 2020-04-21 MED ORDER — ADULT MULTIVITAMIN W/MINERALS CH: 1.0000 | ORAL_TABLET | Freq: Every day | ORAL | Status: DC

## 2020-04-21 MED ORDER — ALPRAZOLAM 0.25 MG PO TABS: 0.2500 mg | ORAL_TABLET | ORAL | 0 refills | Status: DC

## 2020-04-21 NOTE — Plan of Care (Signed)
  Problem: Education: Goal: Knowledge of General Education information will improve Description: Including pain rating scale, medication(s)/side effects and non-pharmacologic comfort measures Outcome: Progressing   Problem: Health Behavior/Discharge Planning: Goal: Ability to manage health-related needs will improve Outcome: Progressing   Problem: Clinical Measurements: Goal: Will remain free from infection Outcome: Progressing   Problem: Activity: Goal: Risk for activity intolerance will decrease Outcome: Progressing   Problem: Nutrition: Goal: Adequate nutrition will be maintained Outcome: Progressing   Problem: Education: Goal: Knowledge of risk factors and measures for prevention of condition will improve Outcome: Progressing   Problem: Respiratory: Goal: Will maintain a patent airway Outcome: Progressing   

## 2020-04-21 NOTE — [place] FL2 (Signed)
[place] MEDICAID FL2 LEVEL OF CARE SCREENING TOOL     IDENTIFICATION  Patient Name: [name] Birthdate: Jan 22, 1956 Sex: female Admission Date (Current Location): 04/16/2020  [facility] and [place] Number:  Herbalist and Address:  [facility], [place] [address] [place], [place] [address]      Provider Number: [id number]  Attending Physician Name and Address:  Jonetta Osgood, MD  Relative Name and Phone Number:  [name], daughter, [phone]    Current Level of Care: Hospital Recommended Level of Care: [place] Prior Approval Number:    Date Approved/Denied:   PASRR Number: [id number] F expires 05/20/20  Discharge Plan: SNF    Current Diagnoses: Patient Active Problem List   Diagnosis Date Noted  . Sepsis secondary to UTI ([place]) 04/17/2020  . COVID-19 virus infection 04/17/2020  . Hypoglycemia without diagnosis of diabetes mellitus 04/17/2020  . Sepsis due to pneumonia ([place]) 04/17/2020  . Physical deconditioning   . Myositis 03/03/2020  . HCAP (healthcare-associated pneumonia) 02/21/2020  . Cough 02/21/2020  . Generalized weakness 02/21/2020  . GAD (generalized anxiety disorder) 02/21/2020  . Proximal muscle weakness 02/08/2020  . Dysphagia 02/08/2020  . Gait disturbance 01/04/2020  . Elevated CK 01/04/2020  . Weakness of both lower extremities 01/04/2020  . Encephalopathy   . Polypharmacy   . CAP (community acquired pneumonia) 12/18/2019  . Fall 12/18/2019  . Hypokalemia   . Abnormal liver function test   . Cervical spondylosis 09/23/2019  . Bilateral elbow joint pain 08/26/2019  . Memory loss 07/02/2019  . Depression with anxiety 07/02/2019  . B12 deficiency 07/02/2019  . Lumbar radiculopathy 02/10/2019  . History of fusion of lumbar spine 02/10/2019  . Dysesthesia 02/10/2019  . Retroperitoneal fibrosis   . Pelvic adhesions   . Left ovarian cyst 11/17/2018  . Elevated tumor markers 11/17/2018  .  Dyspnea 09/15/2018  . Chest pain of uncertain etiology 02/63/7858  . Laryngopharyngeal reflux (LPR) 05/19/2018  . Bipolar disorder ([place]) 04/16/2018  . Insomnia 04/16/2018  . Attention deficit hyperactivity disorder (ADHD) 04/16/2018  . Neck pain on right side 09/09/2014  . 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 Family history of arrhythmogenic right ventricular cardiomyopathy 03/30/2013  . 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 02/20/2013  . Sinus pause 02/14/2013  . Hx of syncope- s/p Loop recorder 11/06/12 10/29/2012  . Vertigo 10/29/2012  . Tobacco abuse 10/29/2012  . ANXIETY 11/14/2009  . CERUMEN IMPACTION, RIGHT 11/14/2009  . BENIGN POSITIONAL VERTIGO 11/14/2009  . EAR PAIN, RIGHT 11/14/2009  . SCIATICA 05/09/2009    Orientation RESPIRATION BLADDER Height & Weight     Self,Situation,Time,Place  Normal Incontinent,External catheter Weight: 117 lb (53.1 kg) Height:  5\' 7"  (170.2 cm)  BEHAVIORAL SYMPTOMS/MOOD NEUROLOGICAL BOWEL NUTRITION STATUS      Continent Diet (Please see DC Summary)  AMBULATORY STATUS COMMUNICATION OF NEEDS Skin   Extensive Assist Verbally Normal                       Personal Care Assistance Level of Assistance  Bathing,Feeding,Dressing Bathing Assistance: Maximum assistance Feeding assistance: Limited assistance Dressing Assistance: Limited assistance     Functional Limitations Info             SPECIAL CARE FACTORS FREQUENCY  PT (By licensed PT),OT (By licensed OT)     PT Frequency: 5x/week OT Frequency: 5x/week            Contractures Contractures Info: Not  present    Additional Factors Info  Code Status,Allergies,Isolation Precautions,Psychotropic Code Status Info: Full Allergies Info: Penicillins Psychotropic Info: isperdal; Lexapro   Isolation Precautions Info: COVID+ 04/17/20     Current Medications (04/21/2020):  This is the current hospital active medication list Current Facility-Administered  Medications  Medication Dose Route Frequency Provider Last Rate Last Admin  . (feeding supplement) PROSource Plus liquid 30 mL  30 mL Oral BID BM Elgergawy, Silver Huguenin, MD   30 mL at 04/20/20 1143  . acetaminophen (TYLENOL) tablet 650 mg  650 mg Oral Q6H PRN Opyd, Ilene Qua, MD      . ALPRAZolam Duanne Moron) tablet 0.5 mg  0.5 mg Oral QHS PRN Elgergawy, Silver Huguenin, MD      . enoxaparin (LOVENOX) injection 40 mg  40 mg Subcutaneous Daily Opyd, Ilene Qua, MD   40 mg at 04/20/20 1143  . feeding supplement (ENSURE ENLIVE / ENSURE PLUS) liquid 237 mL  237 mL Oral TID BM Elgergawy, Silver Huguenin, MD   237 mL at 04/20/20 1604  . mirtazapine (REMERON) tablet 7.5 mg  7.5 mg Oral QHS Elgergawy, Silver Huguenin, MD   7.5 mg at 04/20/20 2347  . multivitamin with minerals tablet 1 tablet  1 tablet Oral Daily Elgergawy, Silver Huguenin, MD   1 tablet at 04/20/20 1145  . ondansetron (ZOFRAN) tablet 4 mg  4 mg Oral Q6H PRN Opyd, Ilene Qua, MD       Or  . ondansetron (ZOFRAN) injection 4 mg  4 mg Intravenous Q6H PRN Opyd, Ilene Qua, MD   4 mg at 04/19/20 1240  . potassium chloride SA (KLOR-CON) CR tablet 40 mEq  40 mEq Oral Once Jonetta Osgood, MD      . risperiDONE (RISPERDAL) tablet 3 mg  3 mg Oral QHS Jonetta Osgood, MD   3 mg at 04/20/20 2346  . senna-docusate (Senokot-S) tablet 1 tablet  1 tablet Oral QHS PRN Opyd, Ilene Qua, MD      . vitamin B-12 (CYANOCOBALAMIN) tablet 1,000 mcg  1,000 mcg Oral Daily Opyd, Ilene Qua, MD   1,000 mcg at 04/20/20 1145     Discharge Medications: Please see discharge summary for a list of discharge medications.  Relevant Imaging Results:  Relevant Lab Results:   Additional Information SSN [id number]  Has had Covid Vaccines  Benard Halsted, LCSW

## 2020-04-21 NOTE — TOC Transition Note (Signed)
Transition of Care [facility]) - CM/SW Discharge Note   Patient Details  Name: [name] MRN: [id number] Date of Birth: Jun 20, 1955  Transition of Care [facility]) CM/SW Contact:  Benard Halsted, LCSW Phone Number: 04/21/2020, 12:32 PM   Clinical Narrative:    Patient will DC to: [facility] Anticipated DC date: 04/21/20 Family notified: Daughter, [name] (left vm for dtr [name]) Transport by: Corey Harold ~2pm   Per MD patient ready for DC to [place]. RN to call report prior to discharge [phone]). RN, patient, patient's family, and facility notified of DC. Discharge Summary and FL2 sent to facility. [facility] has received Biochemist, clinical. DC packet on chart. Ambulance transport requested for patient.   CSW will sign off for now as social work intervention is no longer needed. Please consult [place] again if new needs arise.      Final next level of care: Skilled Nursing Facility Barriers to Discharge: Barriers Resolved   Patient Goals and CMS Choice Patient states their goals for this hospitalization and ongoing recovery are:: Return to snf CMS Medicare.gov Compare Post Acute Care list provided to:: Patient Choice offered to / list presented to : Adult Children,Patient  Discharge Placement PASRR number recieved: 04/21/20            Patient chooses bed at: [facility] Patient to be transferred to facility by: [place] Name of family member notified: Daughters Patient and family notified of of transfer: 04/21/20  Discharge Plan and Services In-house Referral: Clinical Social Work                                   Social Determinants of Health (SDOH) Interventions     Readmission Risk Interventions No flowsheet data found.

## 2020-04-21 NOTE — Progress Notes (Signed)
Report called to nurse at [facility].

## 2020-04-21 NOTE — Progress Notes (Signed)
Physical Therapy Treatment Patient Details Name: [name] MRN: [id number] DOB: [birth year] Today's Date: 04/21/2020    History of Present Illness Pt is a 65 y.o. F with significant PMH of depression, anxiety, physical deconditioning, bipolar disorder, back/neck surgeries, and pacemaker placement who presents from SNF with increased lethargy and sepsis secondary to pneumonia in setting of COVID-19. Pt testing positive for COVID-19 one week ago.    PT Comments    Pt continues to have very flat affect, reports she does remember working with therapy yesterday. Pt with improved mobility, able to come to EoB with min guard, stand with min guard and step to recliner with contact guard assist. D/c plans remain appropriate to progress mobility. Pt is hopeful for discharge today.    Follow Up Recommendations  SNF     Equipment Recommendations  None recommended by PT       Precautions / Restrictions Precautions Precautions: Fall Restrictions Weight Bearing Restrictions: No    Mobility  Bed Mobility Overal bed mobility: Needs Assistance Bed Mobility: Supine to Sit     Supine to sit: Min guard     General bed mobility comments: min guard for safety, increased cuing for sequencing, especially for moving hips to EoB  Transfers Overall transfer level: Needs assistance Equipment used: Rolling walker (2 wheeled) Transfers: Sit to/from Stand Sit to Stand: Min assist         General transfer comment: good power up minA for steadying in standing  Ambulation/Gait Ambulation/Gait assistance: Min assist Gait Distance (Feet): 5 Feet Assistive device: 1 person hand held assist Gait Pattern/deviations: Step-to pattern;Decreased step length - right;Decreased step length - left;Shuffle     General Gait Details: contact guard assist for stepping to recliner on her L, vc for increased BoS and larger step length         Balance Overall balance assessment: Needs  assistance Sitting-balance support: Feet supported;No upper extremity supported Sitting balance-Leahy Scale: Fair Sitting balance - Comments: able to sit at the edge of the chair without assist prior to standing   Standing balance support: Bilateral upper extremity supported Standing balance-Leahy Scale: Fair Standing balance comment: able to static stand without outside assist, but is much better with UE support                            Cognition Arousal/Alertness: Awake/alert Behavior During Therapy: Flat affect Overall Cognitive Status: Impaired/Different from baseline Area of Impairment: Orientation;Following commands;Problem solving                 Orientation Level: Disoriented to;Situation;Time     Following Commands: Follows one step commands inconsistently;Follows multi-step commands inconsistently     Problem Solving: Slow processing General Comments: pt continues to be very flat and answers questions sporatically      Exercises General Exercises - Lower Extremity Ankle Circles/Pumps: AROM;Both;10 reps;Seated Long Arc Quad: Both;10 reps;AROM;Seated Hip ABduction/ADduction: AROM;Both;10 reps;Seated Hip Flexion/Marching: AROM;10 reps;Standing    General Comments General comments (skin integrity, edema, etc.): VSS on RA      Pertinent Vitals/Pain Pain Assessment: No/denies pain Faces Pain Scale: No hurt           PT Goals (current goals can now be found in the care plan section) Acute Rehab PT Goals Patient Stated Goal: did not state PT Goal Formulation: With patient Time For Goal Achievement: 05/01/20 Potential to Achieve Goals: Fair Progress towards PT goals: Progressing toward goals    Frequency  Min 2X/week      PT Plan Current plan remains appropriate       AM-PAC PT "6 Clicks" Mobility   Outcome Measure  Help needed turning from your back to your side while in a flat bed without using bedrails?: A Little Help  needed moving from lying on your back to sitting on the side of a flat bed without using bedrails?: A Little Help needed moving to and from a bed to a chair (including a wheelchair)?: A Little Help needed standing up from a chair using your arms (e.g., wheelchair or bedside chair)?: A Little Help needed to walk in hospital room?: A Lot Help needed climbing 3-5 steps with a railing? : Total 6 Click Score: 15    End of Session Equipment Utilized During Treatment: Gait belt Activity Tolerance: Patient tolerated treatment well (increased HR) Patient left: with call bell/phone within reach;in chair;with chair alarm set Nurse Communication: Mobility status PT Visit Diagnosis: Muscle weakness (generalized) (M62.81);Other abnormalities of gait and mobility (R26.89);History of falling (Z91.81)     Time: 1008-1030 PT Time Calculation (min) (ACUTE ONLY): 22 min  Charges:  $Therapeutic Exercise: 8-22 mins                     Lou Loewe B. Migdalia Dk PT, DPT Acute Rehabilitation Services Pager [phone] Office [phone]    [place] 04/21/2020, 12:12 PM

## 2020-04-21 NOTE — Care Management Important Message (Signed)
Important Message  Patient Details  Name: [name] MRN: [id number] Date of Birth: 06/16/1955   Medicare Important Message Given:  Yes - Important Message mailed due to current National Emergency  Verbal consent obtained due to current National Emergency  Relationship to patient: Self Contact Name: [name] Call Date: 04/21/20  Time: 1415 Phone: [phone] Outcome: No Answer/Busy Important Message mailed to: Patient address on file    Delorse Lek 04/21/2020, 2:15 PM

## 2020-04-21 NOTE — Progress Notes (Signed)
  Speech Language Pathology Treatment: Dysphagia  Patient Details Name: [name] MRN: [id number] DOB: [birth year] Today's Date: 04/21/2020 Time: 2440-1027 SLP Time Calculation (min) (ACUTE ONLY): 14 min  Assessment / Plan / Recommendation Clinical Impression  Pt was seen for dysphagia treatment. Pt's nurse reported that the pt has been tolerating meds in puree without overt s/sx of aspiration, but exhibited some difficulty swallowing a broken multivitamin due its "sharp edges". Pt tolerated regular texture solids and individual boluses of thin liquids without overt s/sx of aspiration. Mastication was prolonged with regular textures but no significant oral residue was noted. She consistently demonstrated coughing with consecutive swallows of thin liquids. Pt continues to require frequent cueing for observance of swallowing precautions. It is recommended that dysphagia solids and thin liquids be continued with full supervision for meals to ensure observance of precautions. She currently has discharge orders and dysphagia treatment is recommended at the next venue of care.    HPI HPI: Pt is a 65 y.o. female with PMH of depression, anxiety, physical deconditioning, bipolar disorder, back/neck surgeries, and pacemaker placement. She presented from SNF with increased lethargy and sepsis secondary to pneumonia in setting of COVID-19. Pt testing positive for COVID-19 one week prior to admission. Pt was seen by [facility],  Dr. Felecia Kim, on 02/08/20 and an outpt MBS was recommended. She had progressive generalized weakness as well as worsening swallow function; there were concerns of possible myasthenia gravis and she was scheduled for a neuro f/u in six months.  MBS completed during admission in December on 02/24/20: moderate oral dysphagia, delayed swallow initiation and decreased UES opening due to prior history of ACDF and presence of cervical fusion plate. Oral phase is adequate, but  strength for negative oral pressure to draw in bolus appears impaired. Bolus cohesion also poor with long thin bolus, contributing to delayed swallow initaition. Aspiration noted prior to adequate hyoid burst and laryngeal closure with consecutive or large boluses of thin liquids. A regular texture diet with individual sips of thin liquids via cup only was recommended at that time. CXR 1/29: Interval development of a trace left pleural effusion with  underlying infection/inflammation not excluded. Lower to mid lung zone vague hazy airspace opacity that could  represent atelectasis versus infection/inflammation.      SLP Plan  Continue with current plan of care       Recommendations  Diet recommendations: Dysphagia 3 (mechanical soft);Thin liquid Liquids provided via: Cup;Straw Medication Administration: Whole meds with puree Supervision: Staff to assist with self feeding;Patient able to self feed;Full supervision/cueing for compensatory strategies Compensations: Slow rate;Small sips/bites (individual sips only) Postural Changes and/or Swallow Maneuvers: Seated upright 90 degrees                Oral Care Recommendations: Oral care BID Follow up Recommendations: Skilled Nursing facility SLP Visit Diagnosis: Dysphagia, oropharyngeal phase (R13.12) Plan: Continue with current plan of care       [name] I. Hardin Negus, [place], [place] Office number [phone] Pager [phone]                [name] 04/21/2020, 2:02 PM

## 2020-04-21 NOTE — Discharge Summary (Addendum)
PATIENT DETAILS Name: [name] Age: 65 y.o. Sex: female Date of Birth: 10-26-1955 MRN: [id number]. Admitting Physician: Vianne Bulls, MD BJS:[id number], Dibas, MD  Admit Date: 04/16/2020 Discharge date: 04/21/2020  Recommendations for Outpatient Follow-up:  1. Follow up with PCP in 1-2 weeks 2. Please obtain CMP/CBC in one week 3. Continue outpatient follow-up with neurologist-Dr. Felecia Shelling  Admitted From:  SNF  Disposition: SNF   Home Health: No  Equipment/Devices: None  Discharge Condition: Stable  CODE STATUS: FULL CODE  Diet recommendation:  Diet Order            Diet - low sodium heart healthy           DIET DYS 3 Room service appropriate? Yes with Assist; Fluid consistency: Thin  Diet effective now                  Brief Narrative: Patient is a 65 y.o. female with PMHx of depression/anxiety, physical deconditioning-who was transferred from SNF for evaluation of progressive weakness.  Patient was apparently Covid +1-week prior to this hospitalization.  COVID-19 vaccinated status: Vaccinated  Significant Events: 1/4>> evaluated in ED for weakness/frequent falls-transfer to SNF 1/29>> admit to [facility] for progressive weakness/lethargy-poor oral intake.  Covid positive.  Significant studies: 1/4>> chest x-ray: Trace left pleural effusion, lower/midlung vague airspace opacity  COVID-19 medications: Remdesivir: 1/29>> 2/2  Antibiotics: Rocephin: 1/29>> 1/31 Keflex: 2/1>>2/2  Microbiology data: 1/30>> blood culture: Negative 1/30>> urine culture: Proteus  Procedures: None  Consults: None  Brief Hospital Course: COVID-19 pneumonia: Afebrile-not hypoxic-has completed IV Remdesivir-currently remains asymptomatic.  COVID-19 Labs:  Recent Labs    04/19/20 0131 04/20/20 0242 04/21/20 0146  DDIMER 1.75* 1.53* 1.12*  CRP 11.7* 9.7* 6.0*    Lab Results  Component Value Date   SARSCOV2NAA NEGATIVE 03/23/2020   SARSCOV2NAA  NEGATIVE 02/26/2020   SARSCOV2NAA NEGATIVE 02/21/2020   [place] NEGATIVE 12/17/2019     UTI-Proteus mirabilis: Completed 5 days treatment of Rocephin and Keflex.  Does not require any further treatment.  Afebrile-asymptomatic.   Generalized deconditioning/failure to thrive/protein calorie malnutrition: Nonfocal exam-moving all 4 extremities-was already in a SNF before she was transferred here to the hospital when she got acutely ill.  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  Evaluated by PT-plans are to discharge back to SNF for continued rehabilitation. She seems to be slowly improving-her heart rate does at times increase with activity (in 120's-130's-sinus tachy) but does come back to normal rate when she sits back down. Suspect as her deconditioning improves, this should improve as well. May benefit from addition of a low dose beta blocker if this continues while at SNF  Vitamin B12 deficiency: Continue supplementation  Depression/anxiety: Stable-continue Risperdal, Remeron and as needed Xanax.  History of sinus pauses/syncope-s/p PPM implantation  Nutrition Problem: Nutrition Problem: Inadequate oral intake Etiology: poor appetite Signs/Symptoms: per patient/family report,meal completion < 50% Interventions: Ensure Enlive (each supplement provides 350kcal and 20 grams of protein),MVI,Magic cup      Discharge Diagnoses:  Principal Problem:   Sepsis due to pneumonia ([place]) Active Problems:   Depression with anxiety   B12 deficiency   Hypokalemia   COVID-19 virus infection   Physical deconditioning   Hypoglycemia without diagnosis of diabetes mellitus   Discharge Instructions:    Person Under Monitoring Name: [name]  Location: [address] [place] [address]   Infection Prevention Recommendations for Individuals Confirmed to have, or Being Evaluated for, 2019 Novel Coronavirus (COVID-19) Infection Who Receive Care at Home  Individuals who are confirmed to have, or are being evaluated for, COVID-19 should follow the prevention steps below until a healthcare provider or local or state health department says they can return to normal activities.  Stay home except to get medical care You should restrict activities outside your home, except for getting medical care. Do not go to work, school, or public areas, and do not use public transportation or taxis.  Call ahead before visiting your doctor Before your medical appointment, call the healthcare provider and tell them that you have, or are being evaluated for, COVID-19 infection. This will help the healthcare provider's office take steps to keep other people from getting infected. Ask your healthcare provider to call the local or state health department.  Monitor your symptoms Seek prompt medical attention if your illness is worsening (e.g., difficulty breathing). Before going to your medical appointment, call the healthcare provider and tell them that you have, or are being evaluated for, COVID-19 infection. Ask your healthcare provider to call the local or state health department.  Wear a facemask You should wear a facemask that covers your nose and mouth when you are in the same room with other people and when you visit a healthcare provider. People who live with or visit you should also wear a facemask while they are in the same room with you.  Separate yourself from other people in your home As much as possible, you should stay in a different room from other people in your home. Also, you should use a separate bathroom, if available.  Avoid sharing household items You should not share dishes, drinking glasses, cups, eating utensils, towels, bedding, or  other items with other people in your home. After using these items, you should wash them thoroughly with soap and water.  Cover your coughs and sneezes Cover your mouth and nose with a tissue when you cough or sneeze, or you can cough or sneeze into your sleeve. Throw used tissues in a lined trash can, and immediately wash your hands with soap and water for at least 20 seconds or use an alcohol-based hand rub.  Wash your [facility] your hands often and thoroughly with soap and water for at least 20 seconds. You can use an alcohol-based hand sanitizer if soap and water are not available and if your hands are not visibly dirty. Avoid touching your eyes, nose, and mouth with unwashed hands.   Prevention Steps for Caregivers and Household Members of Individuals Confirmed to have, or Being Evaluated for, COVID-19 Infection Being Cared for in the Home  If you live with, or provide care at home for, a person confirmed to have, or being evaluated for, COVID-19 infection please follow these guidelines to prevent infection:  Follow healthcare provider's instructions Make sure that you understand and can help the patient follow any healthcare provider instructions for all care.  Provide for the patient's basic needs You should help the patient with basic needs in the home and provide support for getting groceries, prescriptions, and other personal needs.  Monitor the patient's symptoms If they are getting sicker, call his or her medical provider and tell them that the patient has, or is being evaluated for, COVID-19 infection. This will help the healthcare provider's office take steps to keep other people from getting  infected. Ask the healthcare provider to call the local or state health department.  Limit the number of people who have contact with the patient  If possible, have only one caregiver for the patient.  Other household members should stay in another home or place of residence.  If this is not possible, they should stay  in another room, or be separated from the patient as much as possible. Use a separate bathroom, if available.  Restrict visitors who do not have an essential need to be in the home.  Keep older adults, very young children, and other sick people away from the patient Keep older adults, very young children, and those who have compromised immune systems or chronic health conditions away from the patient. This includes people with chronic heart, lung, or kidney conditions, diabetes, and cancer.  Ensure good ventilation Make sure that shared spaces in the home have good air flow, such as from an air conditioner or an opened window, weather permitting.  Wash your hands often  Wash your hands often and thoroughly with soap and water for at least 20 seconds. You can use an alcohol based hand sanitizer if soap and water are not available and if your hands are not visibly dirty.  Avoid touching your eyes, nose, and mouth with unwashed hands.  Use disposable paper towels to dry your hands. If not available, use dedicated cloth towels and replace them when they become wet.  Wear a facemask and gloves  Wear a disposable facemask at all times in the room and gloves when you touch or have contact with the patient's blood, body fluids, and/or secretions or excretions, such as sweat, saliva, sputum, nasal mucus, vomit, urine, or feces.  Ensure the mask fits over your nose and mouth tightly, and do not touch it during use.  Throw out disposable facemasks and gloves after using them. Do not reuse.  Wash your hands immediately after removing your facemask and gloves.  If your personal clothing becomes contaminated, carefully remove clothing and launder. Wash your hands after handling contaminated clothing.  Place all used disposable facemasks, gloves, and other waste in a lined container before disposing them with other household waste.  Remove gloves and wash  your hands immediately after handling these items.  Do not share dishes, glasses, or other household items with the patient  Avoid sharing household items. You should not share dishes, drinking glasses, cups, eating utensils, towels, bedding, or other items with a patient who is confirmed to have, or being evaluated for, COVID-19 infection.  After the person uses these items, you should wash them thoroughly with soap and water.  Wash laundry thoroughly  Immediately remove and wash clothes or bedding that have blood, body fluids, and/or secretions or excretions, such as sweat, saliva, sputum, nasal mucus, vomit, urine, or feces, on them.  Wear gloves when handling laundry from the patient.  Read and follow directions on labels of laundry or clothing items and detergent. In general, wash and dry with the warmest temperatures recommended on the label.  Clean all areas the individual has used often  Clean all touchable surfaces, such as counters, tabletops, doorknobs, bathroom fixtures, toilets, phones, keyboards, tablets, and bedside tables, every day. Also, clean any surfaces that may have blood, body fluids, and/or secretions or excretions on them.  Wear gloves when cleaning surfaces the patient has come in contact with.  Use a diluted bleach solution (e.g., dilute bleach with 1 part bleach and 10 parts water) or a household  disinfectant with a label that says EPA-registered for coronaviruses. To make a bleach solution at home, add 1 tablespoon of bleach to 1 quart (4 cups) of water. For a larger supply, add  cup of bleach to 1 gallon (16 cups) of water.  Read labels of cleaning products and follow recommendations provided on product labels. Labels contain instructions for safe and effective use of the cleaning product including precautions you should take when applying the product, such as wearing gloves or eye protection and making sure you have good ventilation during use of the  product.  Remove gloves and wash hands immediately after cleaning.  Monitor yourself for signs and symptoms of illness Caregivers and household members are considered close contacts, should monitor their health, and will be asked to limit movement outside of the home to the extent possible. Follow the monitoring steps for close contacts listed on the symptom monitoring form.   ? If you have additional questions, contact your local health department or call the epidemiologist on call at [phone] (available 24/7). ? This guidance is subject to change. For the most up-to-date guidance from CDC, please refer to their website: YouBlogs.pl    Activity:  As tolerated with Full fall precautions use walker/cane & assistance as needed  Discharge Instructions    Call MD for:  difficulty breathing, headache or visual disturbances   Complete by: As directed    Diet - low sodium heart healthy   Complete by: As directed    Increase activity slowly   Complete by: As directed      Allergies as of 04/21/2020      Reactions   Penicillins Other (See Comments)   Yeast infection Did it involve swelling of the face/tongue/throat, SOB, or low BP? No Did it involve sudden or severe rash/hives, skin peeling, or any reaction on the inside of your mouth or nose? No Did you need to seek medical attention at a hospital or doctor's office? Yes When did it last happen? More than 5 years ago If all above answers are "NO", may proceed with cephalosporin use.      Medication List    STOP taking these medications   guaifenesin 100 MG/5ML syrup Commonly known as: ROBITUSSIN   vitamin C 500 MG tablet Commonly known as: ASCORBIC ACID   zinc sulfate 220 (50 Zn) MG capsule     TAKE these medications   acetaminophen 325 MG tablet Commonly known as: TYLENOL Take 2 tablets (650 mg total) by mouth every 6 (six) hours as needed for mild pain  (or Fever >/= 101).   ALPRAZolam 0.25 MG tablet Commonly known as: XANAX Take 1 tablet (0.25 mg total) by mouth See admin instructions. Take one tablet (0.25 mg) by mouth daily at bedtime, may also take one tablet (0.25 mg) in the morning as needed for anxiety What changed: Another medication with the same name was removed. Continue taking this medication, and follow the directions you see here.   cyanocobalamin 1000 MCG tablet Take 1 tablet (1,000 mcg total) by mouth daily.   cyclobenzaprine 10 MG tablet Commonly known as: FLEXERIL Take 10 mg by mouth at bedtime as needed for muscle spasms.   feeding supplement Liqd Take 237 mLs by mouth 3 (three) times daily between meals.   (feeding supplement) PROSource Plus liquid Take 30 mLs by mouth 2 (two) times daily between meals.   mirtazapine 7.5 MG tablet Commonly known as: REMERON Take 7.5 mg by mouth at bedtime.   multivitamin with  minerals Tabs tablet Take 1 tablet by mouth daily. Start taking on: April 22, 2020   risperiDONE 3 MG tablet Commonly known as: RISPERDAL Take 1 tablet (3 mg total) by mouth at bedtime.       Contact information for follow-up providers    Koirala, Dibas, MD. Schedule an appointment as soon as possible for a visit in 2 week(s).   Specialty: Family Medicine Contact information: [place] [address] [phone]        Britt Bottom, MD. Schedule an appointment as soon as possible for a visit in 2 week(s).   Specialty: Neurology Contact information: [place] [address] [phone]        Croitoru, Dani Gobble, MD. Schedule an appointment as soon as possible for a visit in 1 month(s).   Specialty: Cardiology Contact information: [address] [place] [address] [phone]            Contact information for after-discharge care    Destination    [facility] .   Service: Skilled Nursing Contact  information: [place] [phone]                 Allergies  Allergen Reactions  . Penicillins Other (See Comments)    Yeast infection Did it involve swelling of the face/tongue/throat, SOB, or low BP? No Did it involve sudden or severe rash/hives, skin peeling, or any reaction on the inside of your mouth or nose? No Did you need to seek medical attention at a hospital or doctor's office? Yes When did it last happen? More than 5 years ago If all above answers are "NO", may proceed with cephalosporin use.      Other Procedures/Studies: DG Chest 2 View  Result Date: 03/22/2020 CLINICAL DATA:  Approximately 15 falls in the past month.  Smoker. EXAM: CHEST - 2 VIEW COMPARISON:  02/21/2020 FINDINGS: Normal sized heart. Clear lungs. 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 Thoracic spine degenerative changes. No fracture or pneumothorax seen. IMPRESSION: No acute abnormality. Electronically Signed   By: Claudie Revering M.D.   On: 03/22/2020 12:53   CT HEAD WO CONTRAST  Result Date: 03/22/2020 CLINICAL DATA:  Frequent falls at home, including today with facial injury. Slow speech. Flat affect. EXAM: CT HEAD WITHOUT CONTRAST TECHNIQUE: Contiguous axial images were obtained from the base of the skull through the vertex without intravenous contrast. COMPARISON:  02/21/2020 head CT. FINDINGS: Brain: No evidence of parenchymal hemorrhage or extra-axial fluid collection. No mass lesion, mass effect, or midline shift. No CT evidence of acute infarction. Cerebral volume is age appropriate. No ventriculomegaly. Vascular: No acute abnormality. Skull: No evidence of calvarial fracture. Sinuses/Orbits: The visualized paranasal sinuses are essentially clear. Other:  The mastoid air cells are unopacified. IMPRESSION: Negative head CT. No evidence of acute intracranial abnormality. No evidence of calvarial fracture. Electronically Signed   By: Ilona Sorrel M.D.   On: 03/22/2020 19:49   DG Chest Port 1 View  Result Date: 04/17/2020 CLINICAL DATA:  Shortness of breath, COVID positive. EXAM: PORTABLE CHEST 1 VIEW.  Patient is rotated. COMPARISON:  Chest x-ray 03/22/2020 FINDINGS: Left chest wall 2 lead cardiac pacemaker in stable position. The heart size and mediastinal contours are unchanged. Aortic arch calcification. Biapical pleural/pulmonary scarring. Lower to mid lung zone vague hazy airspace opacity. No pulmonary edema. Interval development of a trace left pleural effusion. No pneumothorax. No acute osseous abnormality. Partially  visualized cervical spine hardware. IMPRESSION: 1. Interval development of a trace left pleural effusion with underlying infection/inflammation not excluded. 2. Lower to mid lung zone vague hazy airspace opacity that could represent atelectasis versus infection/inflammation. Also limited evaluation due to patient rotation. 3. Followup PA and lateral chest X-ray is recommended in 3-4 weeks following therapy to ensure resolution and exclude underlying malignancy. Electronically Signed   By: Iven Finn M.D.   On: 04/17/2020 00:17     TODAY-DAY OF DISCHARGE:  Subjective:   Aleeyah Bensen today has no headache,no chest abdominal pain,no new weakness tingling or numbness, feels much better wants to go home today.   Objective:   Blood pressure 134/72, pulse 80, temperature 97.7 F (36.5 C), temperature source Oral, resp. rate 20, height 5\' 7"  (1.702 m), weight 53.1 kg, SpO2 97 %.  Intake/Output Summary (Last 24 hours) at 04/21/2020 1148 Last data filed at 04/21/2020 0915 Gross per 24 hour  Intake 60 ml  Output --  Net 60 ml   Filed Weights   04/16/20 2331  Weight: 53.1 kg    Exam: Awake Alert, Oriented *3, No new F.N deficits, Normal affect [place].AT,PERRAL Supple Neck,No JVD, No cervical lymphadenopathy appriciated.  Symmetrical Chest wall movement, Good air movement bilaterally, CTAB RRR,No Gallops,Rubs or  new Murmurs, No Parasternal Heave +ve B.Sounds, Abd Soft, Non tender, No organomegaly appriciated, No rebound -guarding or rigidity. No Cyanosis, Clubbing or edema, No new Rash or bruise   PERTINENT RADIOLOGIC STUDIES: DG Chest 2 View  Result Date: 03/22/2020 CLINICAL DATA:  Approximately 15 falls in the past month.  Smoker. EXAM: CHEST - 2 VIEW COMPARISON:  02/21/2020 FINDINGS: Normal sized heart. Clear lungs. 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 Thoracic spine degenerative changes. No fracture or pneumothorax seen. IMPRESSION: No acute abnormality. Electronically Signed   By: Claudie Revering M.D.   On: 03/22/2020 12:53   CT HEAD WO CONTRAST  Result Date: 03/22/2020 CLINICAL DATA:  Frequent falls at home, including today with facial injury. Slow speech. Flat affect. EXAM: CT HEAD WITHOUT CONTRAST TECHNIQUE: Contiguous axial images were obtained from the base of the skull through the vertex without intravenous contrast. COMPARISON:  02/21/2020 head CT. FINDINGS: Brain: No evidence of parenchymal hemorrhage or extra-axial fluid collection. No mass lesion, mass effect, or midline shift. No CT evidence of acute infarction. Cerebral volume is age appropriate. No ventriculomegaly. Vascular: No acute abnormality. Skull: No evidence of calvarial fracture. Sinuses/Orbits: The visualized paranasal sinuses are essentially clear. Other:  The mastoid air cells are unopacified. IMPRESSION: Negative head CT. No evidence of acute intracranial abnormality. No evidence of calvarial fracture. Electronically Signed   By: Ilona Sorrel M.D.   On: 03/22/2020 19:49   DG Chest Port 1 View  Result Date: 04/17/2020 CLINICAL DATA:  Shortness of breath, COVID positive. EXAM: PORTABLE CHEST 1 VIEW.  Patient is rotated. COMPARISON:  Chest x-ray 03/22/2020 FINDINGS: Left chest wall 2 lead cardiac pacemaker in stable position. The heart size and mediastinal contours are unchanged. Aortic arch calcification. Biapical  pleural/pulmonary scarring. Lower to mid lung zone vague hazy airspace opacity. No pulmonary edema. Interval development of a trace left pleural effusion. No pneumothorax. No acute osseous abnormality. Partially visualized cervical spine hardware. IMPRESSION: 1. Interval development of a trace left pleural effusion with underlying infection/inflammation not excluded. 2. Lower to mid lung zone vague hazy airspace opacity that could represent atelectasis versus infection/inflammation. Also limited evaluation due to patient rotation. 3. Followup PA and lateral chest X-ray is recommended  in 3-4 weeks following therapy to ensure resolution and exclude underlying malignancy. Electronically Signed   By: Iven Finn M.D.   On: 04/17/2020 00:17     PERTINENT LAB RESULTS: CBC: Recent Labs    04/20/20 0242 04/21/20 0146  WBC 7.3 7.6  HGB 11.9* 10.8*  HCT 37.2 32.1*  PLT 439* 400   CMET CMP     Component Value Date/Time   NA 141 04/21/2020 0146   NA 139 01/04/2020 1147   K 3.2 (L) 04/21/2020 0146   CL 104 04/21/2020 0146   CO2 27 04/21/2020 0146   GLUCOSE 112 (H) 04/21/2020 0146   BUN 8 04/21/2020 0146   BUN 11 01/04/2020 1147   CREATININE 0.52 04/21/2020 0146   CREATININE 1.08 (H) 09/24/2018 1019   CALCIUM 8.1 (L) 04/21/2020 0146   PROT 4.9 (L) 04/21/2020 0146   PROT 6.6 01/04/2020 1147   ALBUMIN 2.3 (L) 04/21/2020 0146   ALBUMIN 3.4 (L) 02/25/2020 0910   AST 12 (L) 04/21/2020 0146   ALT 10 04/21/2020 0146   ALKPHOS 39 04/21/2020 0146   BILITOT 0.7 04/21/2020 0146   BILITOT 1.0 01/04/2020 1147   GFRNONAA >60 04/21/2020 0146   GFRNONAA 56 (L) 09/24/2018 1019   GFRAA 69 01/04/2020 1147   GFRAA 65 09/24/2018 1019    GFR Estimated Creatinine Clearance: 61.1 mL/min (by C-G formula based on SCr of 0.52 mg/dL). No results for input(s): LIPASE, AMYLASE in the last 72 hours. No results for input(s): CKTOTAL, CKMB, CKMBINDEX, TROPONINI in the last 72 hours. Invalid input(s):  POCBNP Recent Labs    04/20/20 0242 04/21/20 0146  DDIMER 1.53* 1.12*   No results for input(s): HGBA1C in the last 72 hours. No results for input(s): CHOL, HDL, LDLCALC, TRIG, CHOLHDL, LDLDIRECT in the last 72 hours. No results for input(s): TSH, T4TOTAL, T3FREE, THYROIDAB in the last 72 hours.  Invalid input(s): FREET3 No results for input(s): VITAMINB12, FOLATE, FERRITIN, TIBC, IRON, RETICCTPCT in the last 72 hours. Coags: No results for input(s): INR in the last 72 hours.  Invalid input(s): PT Microbiology: Recent Results (from the past 240 hour(s))  Culture, blood (x 2)     Status: None (Preliminary result)   Collection Time: 04/17/20  2:59 AM   Specimen: BLOOD  Result Value Ref Range Status   Specimen Description BLOOD RIGHT ARM  Final   Special Requests   Final    BOTTLES DRAWN AEROBIC AND ANAEROBIC Blood Culture results may not be optimal due to an inadequate volume of blood received in culture bottles   Culture   Final    NO GROWTH 4 DAYS Performed at [facility], [place] [address] [place], [place] [address]    Report Status PENDING  Incomplete  Culture, blood (x 2)     Status: None (Preliminary result)   Collection Time: 04/17/20  3:05 AM   Specimen: BLOOD  Result Value Ref Range Status   Specimen Description BLOOD LEFT ARM  Final   Special Requests   Final    BOTTLES DRAWN AEROBIC AND ANAEROBIC Blood Culture results may not be optimal due to an inadequate volume of blood received in culture bottles   Culture   Final    NO GROWTH 4 DAYS Performed at [facility], [place] [address], [place], [place] [address]    Report Status PENDING  Incomplete  Culture, Urine     Status: Abnormal   Collection Time: 04/17/20  9:46 AM   Specimen: Urine, Random  Result Value Ref Range Status   Specimen Description URINE, RANDOM  Final   Special Requests   Final    NONE Performed at [facility], [address], [place], [place] [address]    Culture >=100,000  COLONIES/mL PROTEUS MIRABILIS (A)  Final   Report Status 04/19/2020 FINAL  Final   Organism ID, Bacteria PROTEUS MIRABILIS (A)  Final      Susceptibility   Proteus mirabilis - MIC*    AMPICILLIN >=32 RESISTANT Resistant     CEFAZOLIN 8 SENSITIVE Sensitive     CEFEPIME <=0.12 SENSITIVE Sensitive     CEFTRIAXONE <=0.25 SENSITIVE Sensitive     CIPROFLOXACIN <=0.25 SENSITIVE Sensitive     GENTAMICIN <=1 SENSITIVE Sensitive     IMIPENEM 1 SENSITIVE Sensitive     NITROFURANTOIN RESISTANT Resistant     TRIMETH/SULFA <=20 SENSITIVE Sensitive     AMPICILLIN/SULBACTAM >=32 RESISTANT Resistant     PIP/TAZO <=4 SENSITIVE Sensitive     * >=100,000 COLONIES/mL PROTEUS MIRABILIS  MRSA PCR Screening     Status: None   Collection Time: 04/17/20 10:45 PM   Specimen: Nasal Mucosa; Nasopharyngeal  Result Value Ref Range Status   MRSA by PCR NEGATIVE NEGATIVE Final    Comment:        The GeneXpert MRSA Assay (FDA approved for NASAL specimens only), is one component of a comprehensive MRSA colonization surveillance program. It is not intended to diagnose MRSA infection nor to guide or monitor treatment for MRSA infections. Performed at [facility], [place] [address] [place], [place] [address]     FURTHER DISCHARGE INSTRUCTIONS:  Get Medicines reviewed and adjusted: Please take all your medications with you for your next visit with your Primary MD  Laboratory/radiological data: Please request your Primary MD to go over all hospital tests and procedure/radiological results at the follow up, please ask your Primary MD to get all Hospital records sent to his/her office.  In some cases, they will be blood work, cultures and biopsy results pending at the time of your discharge. Please request that your primary care M.D. goes through all the records of your hospital data and follows up on these results.  Also Note the following: If you experience worsening of your admission symptoms, develop  shortness of breath, life threatening emergency, suicidal or homicidal thoughts you must seek medical attention immediately by calling 911 or calling your MD immediately  if symptoms less severe.  You must read complete instructions/literature along with all the possible adverse reactions/side effects for all the Medicines you take and that have been prescribed to you. Take any new Medicines after you have completely understood and accpet all the possible adverse reactions/side effects.   Do not drive when taking Pain medications or sleeping medications (Benzodaizepines)  Do not take more than prescribed Pain, Sleep and Anxiety Medications. It is not advisable to combine anxiety,sleep and pain medications without talking with your primary care practitioner  Special Instructions: If you have smoked or chewed Tobacco  in the last 2 yrs please stop smoking, stop any regular Alcohol  and or any Recreational drug use.  Wear Seat belts while driving.  Please note: You were cared for by a hospitalist during your hospital stay. Once you are discharged, your primary care physician will handle any further medical issues. Please note that NO REFILLS for any discharge medications will be authorized once you are discharged, as it is imperative that you return to your primary care physician (or  establish a relationship with a primary care physician if you do not have one) for your post hospital discharge needs so that they can reassess your need for medications and monitor your lab values.  Total Time spent coordinating discharge including counseling, education and face to face time equals 35 minutes.  SignedOren Binet 04/21/2020 11:48 AM

## 2020-04-22 LAB — CULTURE, BLOOD (ROUTINE X 2)
Culture: NO GROWTH
Culture: NO GROWTH

## 2020-04-25 ENCOUNTER — Telehealth: Payer: Self-pay | Admitting: Physician Assistant

## 2020-04-25 DIAGNOSIS — B964 Proteus (mirabilis) (morganii) as the cause of diseases classified elsewhere: Secondary | ICD-10-CM | POA: Diagnosis not present

## 2020-04-25 DIAGNOSIS — E46 Unspecified protein-calorie malnutrition: Secondary | ICD-10-CM | POA: Diagnosis not present

## 2020-04-25 DIAGNOSIS — A4189 Other specified sepsis: Secondary | ICD-10-CM | POA: Diagnosis not present

## 2020-04-25 DIAGNOSIS — R69 Illness, unspecified: Secondary | ICD-10-CM | POA: Diagnosis not present

## 2020-04-25 DIAGNOSIS — N390 Urinary tract infection, site not specified: Secondary | ICD-10-CM | POA: Diagnosis not present

## 2020-04-25 DIAGNOSIS — D6489 Other specified anemias: Secondary | ICD-10-CM | POA: Diagnosis not present

## 2020-04-25 DIAGNOSIS — Z950 Presence of cardiac pacemaker: Secondary | ICD-10-CM | POA: Diagnosis not present

## 2020-04-25 DIAGNOSIS — U071 COVID-19: Secondary | ICD-10-CM | POA: Diagnosis not present

## 2020-04-25 DIAGNOSIS — E538 Deficiency of other specified B group vitamins: Secondary | ICD-10-CM | POA: Diagnosis not present

## 2020-04-25 DIAGNOSIS — E876 Hypokalemia: Secondary | ICD-10-CM | POA: Diagnosis not present

## 2020-04-25 NOTE — Telephone Encounter (Signed)
Pt called and said that she would like something to help her stay calm. She has just come home from the hospital and they took her off most of her medicines. She has an appt 06/16/20. Please give her a call at [phone]

## 2020-04-26 NOTE — Telephone Encounter (Signed)
I returned call to patient.  States she is very anxious, just got out of the hospital but does not know why she was there, "they took me off of all of my medicines."  I asked which medication for anxiety that has helped the most and she said "none of them really."  She is in a subacute nursing facility now.  She should ask her nurse to discuss the anxiety with the medical provider assigned to her case.  I will be happy to talk with him or her concerning the anxiety, but I am unable to prescribe medications at the facility as I am not on staff there.  I would not prescribe benzodiazepines anyway, which is what Martina has most recently taken.

## 2020-04-27 DIAGNOSIS — G478 Other sleep disorders: Secondary | ICD-10-CM | POA: Diagnosis not present

## 2020-04-27 DIAGNOSIS — R69 Illness, unspecified: Secondary | ICD-10-CM | POA: Diagnosis not present

## 2020-04-28 DIAGNOSIS — F411 Generalized anxiety disorder: Secondary | ICD-10-CM | POA: Diagnosis not present

## 2020-04-28 DIAGNOSIS — F331 Major depressive disorder, recurrent, moderate: Secondary | ICD-10-CM | POA: Diagnosis not present

## 2020-04-28 DIAGNOSIS — R69 Illness, unspecified: Secondary | ICD-10-CM | POA: Diagnosis not present

## 2020-04-29 DIAGNOSIS — R69 Illness, unspecified: Secondary | ICD-10-CM | POA: Diagnosis not present

## 2020-04-29 DIAGNOSIS — Z8739 Personal history of other diseases of the musculoskeletal system and connective tissue: Secondary | ICD-10-CM | POA: Diagnosis not present

## 2020-04-29 DIAGNOSIS — A4189 Other specified sepsis: Secondary | ICD-10-CM | POA: Diagnosis not present

## 2020-05-08 ENCOUNTER — Other Ambulatory Visit: Payer: Self-pay | Admitting: Physician Assistant

## 2020-05-08 DIAGNOSIS — J189 Pneumonia, unspecified organism: Secondary | ICD-10-CM | POA: Diagnosis not present

## 2020-05-08 DIAGNOSIS — M6281 Muscle weakness (generalized): Secondary | ICD-10-CM | POA: Diagnosis not present

## 2020-05-08 NOTE — Telephone Encounter (Signed)
Can you please ok Risperdal RF and deny Buspar.  Thanks

## 2020-05-10 ENCOUNTER — Ambulatory Visit: Payer: Medicare HMO | Admitting: Neurology

## 2020-05-10 NOTE — Telephone Encounter (Signed)
Late entry: appointment reminder per pt request.

## 2020-05-12 DIAGNOSIS — R69 Illness, unspecified: Secondary | ICD-10-CM | POA: Diagnosis not present

## 2020-05-12 DIAGNOSIS — F419 Anxiety disorder, unspecified: Secondary | ICD-10-CM | POA: Diagnosis not present

## 2020-05-13 DIAGNOSIS — R5381 Other malaise: Secondary | ICD-10-CM | POA: Diagnosis not present

## 2020-05-13 DIAGNOSIS — R269 Unspecified abnormalities of gait and mobility: Secondary | ICD-10-CM | POA: Diagnosis not present

## 2020-05-13 DIAGNOSIS — Z8739 Personal history of other diseases of the musculoskeletal system and connective tissue: Secondary | ICD-10-CM | POA: Diagnosis not present

## 2020-05-13 DIAGNOSIS — E46 Unspecified protein-calorie malnutrition: Secondary | ICD-10-CM | POA: Diagnosis not present

## 2020-05-15 DIAGNOSIS — E538 Deficiency of other specified B group vitamins: Secondary | ICD-10-CM | POA: Diagnosis not present

## 2020-05-15 DIAGNOSIS — M47812 Spondylosis without myelopathy or radiculopathy, cervical region: Secondary | ICD-10-CM | POA: Diagnosis not present

## 2020-05-15 DIAGNOSIS — D649 Anemia, unspecified: Secondary | ICD-10-CM | POA: Diagnosis not present

## 2020-05-15 DIAGNOSIS — Z8616 Personal history of COVID-19: Secondary | ICD-10-CM | POA: Diagnosis not present

## 2020-05-15 DIAGNOSIS — E162 Hypoglycemia, unspecified: Secondary | ICD-10-CM | POA: Diagnosis not present

## 2020-05-15 DIAGNOSIS — R1310 Dysphagia, unspecified: Secondary | ICD-10-CM | POA: Diagnosis not present

## 2020-05-15 DIAGNOSIS — R32 Unspecified urinary incontinence: Secondary | ICD-10-CM | POA: Diagnosis not present

## 2020-05-15 DIAGNOSIS — Z950 Presence of cardiac pacemaker: Secondary | ICD-10-CM | POA: Diagnosis not present

## 2020-05-15 DIAGNOSIS — Z8701 Personal history of pneumonia (recurrent): Secondary | ICD-10-CM | POA: Diagnosis not present

## 2020-05-15 DIAGNOSIS — E440 Moderate protein-calorie malnutrition: Secondary | ICD-10-CM | POA: Diagnosis not present

## 2020-05-15 DIAGNOSIS — M5430 Sciatica, unspecified side: Secondary | ICD-10-CM | POA: Diagnosis not present

## 2020-05-15 DIAGNOSIS — M5416 Radiculopathy, lumbar region: Secondary | ICD-10-CM | POA: Diagnosis not present

## 2020-05-15 DIAGNOSIS — G4700 Insomnia, unspecified: Secondary | ICD-10-CM | POA: Diagnosis not present

## 2020-05-15 DIAGNOSIS — K219 Gastro-esophageal reflux disease without esophagitis: Secondary | ICD-10-CM | POA: Diagnosis not present

## 2020-05-15 DIAGNOSIS — R69 Illness, unspecified: Secondary | ICD-10-CM | POA: Diagnosis not present

## 2020-05-15 DIAGNOSIS — R413 Other amnesia: Secondary | ICD-10-CM | POA: Diagnosis not present

## 2020-05-15 DIAGNOSIS — Z87440 Personal history of urinary (tract) infections: Secondary | ICD-10-CM | POA: Diagnosis not present

## 2020-05-15 DIAGNOSIS — F17210 Nicotine dependence, cigarettes, uncomplicated: Secondary | ICD-10-CM | POA: Diagnosis not present

## 2020-05-15 DIAGNOSIS — M6281 Muscle weakness (generalized): Secondary | ICD-10-CM | POA: Diagnosis not present

## 2020-05-15 DIAGNOSIS — Z8541 Personal history of malignant neoplasm of cervix uteri: Secondary | ICD-10-CM | POA: Diagnosis not present

## 2020-05-16 ENCOUNTER — Other Ambulatory Visit: Payer: Self-pay | Admitting: Physician Assistant

## 2020-05-16 DIAGNOSIS — G4700 Insomnia, unspecified: Secondary | ICD-10-CM

## 2020-05-16 DIAGNOSIS — F411 Generalized anxiety disorder: Secondary | ICD-10-CM

## 2020-05-16 DIAGNOSIS — G4709 Other insomnia: Secondary | ICD-10-CM

## 2020-05-17 ENCOUNTER — Institutional Professional Consult (permissible substitution): Payer: Medicare HMO | Admitting: Pulmonary Disease

## 2020-05-17 DIAGNOSIS — G4700 Insomnia, unspecified: Secondary | ICD-10-CM | POA: Diagnosis not present

## 2020-05-17 DIAGNOSIS — E162 Hypoglycemia, unspecified: Secondary | ICD-10-CM | POA: Diagnosis not present

## 2020-05-17 DIAGNOSIS — M5430 Sciatica, unspecified side: Secondary | ICD-10-CM | POA: Diagnosis not present

## 2020-05-17 DIAGNOSIS — D649 Anemia, unspecified: Secondary | ICD-10-CM | POA: Diagnosis not present

## 2020-05-17 DIAGNOSIS — Z8616 Personal history of COVID-19: Secondary | ICD-10-CM | POA: Diagnosis not present

## 2020-05-17 DIAGNOSIS — M5416 Radiculopathy, lumbar region: Secondary | ICD-10-CM | POA: Diagnosis not present

## 2020-05-17 DIAGNOSIS — K219 Gastro-esophageal reflux disease without esophagitis: Secondary | ICD-10-CM | POA: Diagnosis not present

## 2020-05-17 DIAGNOSIS — R1310 Dysphagia, unspecified: Secondary | ICD-10-CM | POA: Diagnosis not present

## 2020-05-17 DIAGNOSIS — E440 Moderate protein-calorie malnutrition: Secondary | ICD-10-CM | POA: Diagnosis not present

## 2020-05-17 DIAGNOSIS — F17210 Nicotine dependence, cigarettes, uncomplicated: Secondary | ICD-10-CM | POA: Diagnosis not present

## 2020-05-17 DIAGNOSIS — M6281 Muscle weakness (generalized): Secondary | ICD-10-CM | POA: Diagnosis not present

## 2020-05-17 DIAGNOSIS — M47812 Spondylosis without myelopathy or radiculopathy, cervical region: Secondary | ICD-10-CM | POA: Diagnosis not present

## 2020-05-17 DIAGNOSIS — Z950 Presence of cardiac pacemaker: Secondary | ICD-10-CM | POA: Diagnosis not present

## 2020-05-17 DIAGNOSIS — R32 Unspecified urinary incontinence: Secondary | ICD-10-CM | POA: Diagnosis not present

## 2020-05-17 DIAGNOSIS — Z87440 Personal history of urinary (tract) infections: Secondary | ICD-10-CM | POA: Diagnosis not present

## 2020-05-17 DIAGNOSIS — Z8541 Personal history of malignant neoplasm of cervix uteri: Secondary | ICD-10-CM | POA: Diagnosis not present

## 2020-05-17 DIAGNOSIS — Z8701 Personal history of pneumonia (recurrent): Secondary | ICD-10-CM | POA: Diagnosis not present

## 2020-05-17 DIAGNOSIS — E538 Deficiency of other specified B group vitamins: Secondary | ICD-10-CM | POA: Diagnosis not present

## 2020-05-17 DIAGNOSIS — R413 Other amnesia: Secondary | ICD-10-CM | POA: Diagnosis not present

## 2020-05-17 DIAGNOSIS — R69 Illness, unspecified: Secondary | ICD-10-CM | POA: Diagnosis not present

## 2020-05-19 DIAGNOSIS — M6281 Muscle weakness (generalized): Secondary | ICD-10-CM | POA: Diagnosis not present

## 2020-05-19 DIAGNOSIS — R32 Unspecified urinary incontinence: Secondary | ICD-10-CM | POA: Diagnosis not present

## 2020-05-19 DIAGNOSIS — R1310 Dysphagia, unspecified: Secondary | ICD-10-CM | POA: Diagnosis not present

## 2020-05-19 DIAGNOSIS — E162 Hypoglycemia, unspecified: Secondary | ICD-10-CM | POA: Diagnosis not present

## 2020-05-19 DIAGNOSIS — M5430 Sciatica, unspecified side: Secondary | ICD-10-CM | POA: Diagnosis not present

## 2020-05-19 DIAGNOSIS — M5416 Radiculopathy, lumbar region: Secondary | ICD-10-CM | POA: Diagnosis not present

## 2020-05-19 DIAGNOSIS — M47812 Spondylosis without myelopathy or radiculopathy, cervical region: Secondary | ICD-10-CM | POA: Diagnosis not present

## 2020-05-19 DIAGNOSIS — Z8701 Personal history of pneumonia (recurrent): Secondary | ICD-10-CM | POA: Diagnosis not present

## 2020-05-19 DIAGNOSIS — R69 Illness, unspecified: Secondary | ICD-10-CM | POA: Diagnosis not present

## 2020-05-19 DIAGNOSIS — K219 Gastro-esophageal reflux disease without esophagitis: Secondary | ICD-10-CM | POA: Diagnosis not present

## 2020-05-19 DIAGNOSIS — E538 Deficiency of other specified B group vitamins: Secondary | ICD-10-CM | POA: Diagnosis not present

## 2020-05-19 DIAGNOSIS — Z87440 Personal history of urinary (tract) infections: Secondary | ICD-10-CM | POA: Diagnosis not present

## 2020-05-19 DIAGNOSIS — F17210 Nicotine dependence, cigarettes, uncomplicated: Secondary | ICD-10-CM | POA: Diagnosis not present

## 2020-05-19 DIAGNOSIS — Z8616 Personal history of COVID-19: Secondary | ICD-10-CM | POA: Diagnosis not present

## 2020-05-19 DIAGNOSIS — R413 Other amnesia: Secondary | ICD-10-CM | POA: Diagnosis not present

## 2020-05-19 DIAGNOSIS — D649 Anemia, unspecified: Secondary | ICD-10-CM | POA: Diagnosis not present

## 2020-05-19 DIAGNOSIS — Z8541 Personal history of malignant neoplasm of cervix uteri: Secondary | ICD-10-CM | POA: Diagnosis not present

## 2020-05-19 DIAGNOSIS — E440 Moderate protein-calorie malnutrition: Secondary | ICD-10-CM | POA: Diagnosis not present

## 2020-05-19 DIAGNOSIS — G4700 Insomnia, unspecified: Secondary | ICD-10-CM | POA: Diagnosis not present

## 2020-05-19 DIAGNOSIS — Z950 Presence of cardiac pacemaker: Secondary | ICD-10-CM | POA: Diagnosis not present

## 2020-05-20 DIAGNOSIS — J90 Pleural effusion, not elsewhere classified: Secondary | ICD-10-CM | POA: Diagnosis not present

## 2020-05-20 DIAGNOSIS — U071 COVID-19: Secondary | ICD-10-CM | POA: Diagnosis not present

## 2020-05-20 DIAGNOSIS — R29898 Other symptoms and signs involving the musculoskeletal system: Secondary | ICD-10-CM | POA: Diagnosis not present

## 2020-05-20 DIAGNOSIS — Z79899 Other long term (current) drug therapy: Secondary | ICD-10-CM | POA: Diagnosis not present

## 2020-05-20 DIAGNOSIS — D649 Anemia, unspecified: Secondary | ICD-10-CM | POA: Diagnosis not present

## 2020-05-23 DIAGNOSIS — E162 Hypoglycemia, unspecified: Secondary | ICD-10-CM | POA: Diagnosis not present

## 2020-05-23 DIAGNOSIS — E538 Deficiency of other specified B group vitamins: Secondary | ICD-10-CM | POA: Diagnosis not present

## 2020-05-23 DIAGNOSIS — M5430 Sciatica, unspecified side: Secondary | ICD-10-CM | POA: Diagnosis not present

## 2020-05-23 DIAGNOSIS — R32 Unspecified urinary incontinence: Secondary | ICD-10-CM | POA: Diagnosis not present

## 2020-05-23 DIAGNOSIS — R1310 Dysphagia, unspecified: Secondary | ICD-10-CM | POA: Diagnosis not present

## 2020-05-23 DIAGNOSIS — F17210 Nicotine dependence, cigarettes, uncomplicated: Secondary | ICD-10-CM | POA: Diagnosis not present

## 2020-05-23 DIAGNOSIS — K219 Gastro-esophageal reflux disease without esophagitis: Secondary | ICD-10-CM | POA: Diagnosis not present

## 2020-05-23 DIAGNOSIS — R413 Other amnesia: Secondary | ICD-10-CM | POA: Diagnosis not present

## 2020-05-23 DIAGNOSIS — E440 Moderate protein-calorie malnutrition: Secondary | ICD-10-CM | POA: Diagnosis not present

## 2020-05-23 DIAGNOSIS — Z8701 Personal history of pneumonia (recurrent): Secondary | ICD-10-CM | POA: Diagnosis not present

## 2020-05-23 DIAGNOSIS — Z87440 Personal history of urinary (tract) infections: Secondary | ICD-10-CM | POA: Diagnosis not present

## 2020-05-23 DIAGNOSIS — Z8541 Personal history of malignant neoplasm of cervix uteri: Secondary | ICD-10-CM | POA: Diagnosis not present

## 2020-05-23 DIAGNOSIS — R69 Illness, unspecified: Secondary | ICD-10-CM | POA: Diagnosis not present

## 2020-05-23 DIAGNOSIS — D649 Anemia, unspecified: Secondary | ICD-10-CM | POA: Diagnosis not present

## 2020-05-23 DIAGNOSIS — G4700 Insomnia, unspecified: Secondary | ICD-10-CM | POA: Diagnosis not present

## 2020-05-23 DIAGNOSIS — M5416 Radiculopathy, lumbar region: Secondary | ICD-10-CM | POA: Diagnosis not present

## 2020-05-23 DIAGNOSIS — M6281 Muscle weakness (generalized): Secondary | ICD-10-CM | POA: Diagnosis not present

## 2020-05-23 DIAGNOSIS — M47812 Spondylosis without myelopathy or radiculopathy, cervical region: Secondary | ICD-10-CM | POA: Diagnosis not present

## 2020-05-23 DIAGNOSIS — Z950 Presence of cardiac pacemaker: Secondary | ICD-10-CM | POA: Diagnosis not present

## 2020-05-23 DIAGNOSIS — Z8616 Personal history of COVID-19: Secondary | ICD-10-CM | POA: Diagnosis not present

## 2020-05-23 NOTE — Telephone Encounter (Signed)
I'm going to refuse both because you changed her to risperdal 3 mg on 03/01 I believe?

## 2020-05-23 NOTE — Telephone Encounter (Signed)
You're right. Thanks

## 2020-05-24 DIAGNOSIS — R1310 Dysphagia, unspecified: Secondary | ICD-10-CM | POA: Diagnosis not present

## 2020-05-24 DIAGNOSIS — E440 Moderate protein-calorie malnutrition: Secondary | ICD-10-CM | POA: Diagnosis not present

## 2020-05-24 DIAGNOSIS — Z8541 Personal history of malignant neoplasm of cervix uteri: Secondary | ICD-10-CM | POA: Diagnosis not present

## 2020-05-24 DIAGNOSIS — Z8701 Personal history of pneumonia (recurrent): Secondary | ICD-10-CM | POA: Diagnosis not present

## 2020-05-24 DIAGNOSIS — E538 Deficiency of other specified B group vitamins: Secondary | ICD-10-CM | POA: Diagnosis not present

## 2020-05-24 DIAGNOSIS — Z950 Presence of cardiac pacemaker: Secondary | ICD-10-CM | POA: Diagnosis not present

## 2020-05-24 DIAGNOSIS — Z8616 Personal history of COVID-19: Secondary | ICD-10-CM | POA: Diagnosis not present

## 2020-05-24 DIAGNOSIS — R32 Unspecified urinary incontinence: Secondary | ICD-10-CM | POA: Diagnosis not present

## 2020-05-24 DIAGNOSIS — Z87440 Personal history of urinary (tract) infections: Secondary | ICD-10-CM | POA: Diagnosis not present

## 2020-05-24 DIAGNOSIS — M47812 Spondylosis without myelopathy or radiculopathy, cervical region: Secondary | ICD-10-CM | POA: Diagnosis not present

## 2020-05-24 DIAGNOSIS — F17210 Nicotine dependence, cigarettes, uncomplicated: Secondary | ICD-10-CM | POA: Diagnosis not present

## 2020-05-24 DIAGNOSIS — E162 Hypoglycemia, unspecified: Secondary | ICD-10-CM | POA: Diagnosis not present

## 2020-05-24 DIAGNOSIS — G4700 Insomnia, unspecified: Secondary | ICD-10-CM | POA: Diagnosis not present

## 2020-05-24 DIAGNOSIS — D649 Anemia, unspecified: Secondary | ICD-10-CM | POA: Diagnosis not present

## 2020-05-24 DIAGNOSIS — R413 Other amnesia: Secondary | ICD-10-CM | POA: Diagnosis not present

## 2020-05-24 DIAGNOSIS — M5416 Radiculopathy, lumbar region: Secondary | ICD-10-CM | POA: Diagnosis not present

## 2020-05-24 DIAGNOSIS — R69 Illness, unspecified: Secondary | ICD-10-CM | POA: Diagnosis not present

## 2020-05-24 DIAGNOSIS — M6281 Muscle weakness (generalized): Secondary | ICD-10-CM | POA: Diagnosis not present

## 2020-05-24 DIAGNOSIS — M5430 Sciatica, unspecified side: Secondary | ICD-10-CM | POA: Diagnosis not present

## 2020-05-24 DIAGNOSIS — K219 Gastro-esophageal reflux disease without esophagitis: Secondary | ICD-10-CM | POA: Diagnosis not present

## 2020-05-26 ENCOUNTER — Ambulatory Visit (INDEPENDENT_AMBULATORY_CARE_PROVIDER_SITE_OTHER): Payer: Medicare HMO

## 2020-05-26 DIAGNOSIS — Z950 Presence of cardiac pacemaker: Secondary | ICD-10-CM | POA: Diagnosis not present

## 2020-05-26 DIAGNOSIS — R1310 Dysphagia, unspecified: Secondary | ICD-10-CM | POA: Diagnosis not present

## 2020-05-26 DIAGNOSIS — E440 Moderate protein-calorie malnutrition: Secondary | ICD-10-CM | POA: Diagnosis not present

## 2020-05-26 DIAGNOSIS — Z8616 Personal history of COVID-19: Secondary | ICD-10-CM | POA: Diagnosis not present

## 2020-05-26 DIAGNOSIS — R32 Unspecified urinary incontinence: Secondary | ICD-10-CM | POA: Diagnosis not present

## 2020-05-26 DIAGNOSIS — F17210 Nicotine dependence, cigarettes, uncomplicated: Secondary | ICD-10-CM | POA: Diagnosis not present

## 2020-05-26 DIAGNOSIS — D649 Anemia, unspecified: Secondary | ICD-10-CM | POA: Diagnosis not present

## 2020-05-26 DIAGNOSIS — K219 Gastro-esophageal reflux disease without esophagitis: Secondary | ICD-10-CM | POA: Diagnosis not present

## 2020-05-26 DIAGNOSIS — Z87440 Personal history of urinary (tract) infections: Secondary | ICD-10-CM | POA: Diagnosis not present

## 2020-05-26 DIAGNOSIS — I495 Sick sinus syndrome: Secondary | ICD-10-CM

## 2020-05-26 DIAGNOSIS — E538 Deficiency of other specified B group vitamins: Secondary | ICD-10-CM | POA: Diagnosis not present

## 2020-05-26 DIAGNOSIS — M5430 Sciatica, unspecified side: Secondary | ICD-10-CM | POA: Diagnosis not present

## 2020-05-26 DIAGNOSIS — Z8701 Personal history of pneumonia (recurrent): Secondary | ICD-10-CM | POA: Diagnosis not present

## 2020-05-26 DIAGNOSIS — M6281 Muscle weakness (generalized): Secondary | ICD-10-CM | POA: Diagnosis not present

## 2020-05-26 DIAGNOSIS — M47812 Spondylosis without myelopathy or radiculopathy, cervical region: Secondary | ICD-10-CM | POA: Diagnosis not present

## 2020-05-26 DIAGNOSIS — R69 Illness, unspecified: Secondary | ICD-10-CM | POA: Diagnosis not present

## 2020-05-26 DIAGNOSIS — R413 Other amnesia: Secondary | ICD-10-CM | POA: Diagnosis not present

## 2020-05-26 DIAGNOSIS — E162 Hypoglycemia, unspecified: Secondary | ICD-10-CM | POA: Diagnosis not present

## 2020-05-26 DIAGNOSIS — G4700 Insomnia, unspecified: Secondary | ICD-10-CM | POA: Diagnosis not present

## 2020-05-26 DIAGNOSIS — Z8541 Personal history of malignant neoplasm of cervix uteri: Secondary | ICD-10-CM | POA: Diagnosis not present

## 2020-05-26 DIAGNOSIS — M5416 Radiculopathy, lumbar region: Secondary | ICD-10-CM | POA: Diagnosis not present

## 2020-05-27 DIAGNOSIS — R1310 Dysphagia, unspecified: Secondary | ICD-10-CM | POA: Diagnosis not present

## 2020-05-27 DIAGNOSIS — M6281 Muscle weakness (generalized): Secondary | ICD-10-CM | POA: Diagnosis not present

## 2020-05-27 DIAGNOSIS — M47812 Spondylosis without myelopathy or radiculopathy, cervical region: Secondary | ICD-10-CM | POA: Diagnosis not present

## 2020-05-27 DIAGNOSIS — E538 Deficiency of other specified B group vitamins: Secondary | ICD-10-CM | POA: Diagnosis not present

## 2020-05-27 DIAGNOSIS — M5416 Radiculopathy, lumbar region: Secondary | ICD-10-CM | POA: Diagnosis not present

## 2020-05-27 DIAGNOSIS — R32 Unspecified urinary incontinence: Secondary | ICD-10-CM | POA: Diagnosis not present

## 2020-05-27 DIAGNOSIS — M5430 Sciatica, unspecified side: Secondary | ICD-10-CM | POA: Diagnosis not present

## 2020-05-27 DIAGNOSIS — Z87440 Personal history of urinary (tract) infections: Secondary | ICD-10-CM | POA: Diagnosis not present

## 2020-05-27 DIAGNOSIS — D649 Anemia, unspecified: Secondary | ICD-10-CM | POA: Diagnosis not present

## 2020-05-27 DIAGNOSIS — R413 Other amnesia: Secondary | ICD-10-CM | POA: Diagnosis not present

## 2020-05-27 DIAGNOSIS — F17210 Nicotine dependence, cigarettes, uncomplicated: Secondary | ICD-10-CM | POA: Diagnosis not present

## 2020-05-27 DIAGNOSIS — Z8541 Personal history of malignant neoplasm of cervix uteri: Secondary | ICD-10-CM | POA: Diagnosis not present

## 2020-05-27 DIAGNOSIS — Z8616 Personal history of COVID-19: Secondary | ICD-10-CM | POA: Diagnosis not present

## 2020-05-27 DIAGNOSIS — G4700 Insomnia, unspecified: Secondary | ICD-10-CM | POA: Diagnosis not present

## 2020-05-27 DIAGNOSIS — R69 Illness, unspecified: Secondary | ICD-10-CM | POA: Diagnosis not present

## 2020-05-27 DIAGNOSIS — E440 Moderate protein-calorie malnutrition: Secondary | ICD-10-CM | POA: Diagnosis not present

## 2020-05-27 DIAGNOSIS — E162 Hypoglycemia, unspecified: Secondary | ICD-10-CM | POA: Diagnosis not present

## 2020-05-27 DIAGNOSIS — K219 Gastro-esophageal reflux disease without esophagitis: Secondary | ICD-10-CM | POA: Diagnosis not present

## 2020-05-27 DIAGNOSIS — Z8701 Personal history of pneumonia (recurrent): Secondary | ICD-10-CM | POA: Diagnosis not present

## 2020-05-27 DIAGNOSIS — Z950 Presence of cardiac pacemaker: Secondary | ICD-10-CM | POA: Diagnosis not present

## 2020-05-29 LAB — CUP PACEART REMOTE DEVICE CHECK
Battery Impedance: 379 Ohm
Battery Remaining Longevity: 113 mo
Battery Voltage: 2.78 V
Brady Statistic AP VP Percent: 0 %
Brady Statistic AP VS Percent: 0 %
Brady Statistic AS VP Percent: 2 %
Brady Statistic AS VS Percent: 98 %
Date Time Interrogation Session: 20220310132525
Implantable Lead Implant Date: 20141205
Implantable Lead Implant Date: 20141205
Implantable Lead Location: 753859
Implantable Lead Location: 753860
Implantable Lead Model: 5076
Implantable Lead Model: 5076
Implantable Pulse Generator Implant Date: 20141205
Lead Channel Impedance Value: 443 Ohm
Lead Channel Impedance Value: 508 Ohm
Lead Channel Pacing Threshold Amplitude: 0.375 V
Lead Channel Pacing Threshold Amplitude: 1.25 V
Lead Channel Pacing Threshold Pulse Width: 0.4 ms
Lead Channel Pacing Threshold Pulse Width: 0.4 ms
Lead Channel Setting Pacing Amplitude: 2 V
Lead Channel Setting Pacing Amplitude: 2.5 V
Lead Channel Setting Pacing Pulse Width: 0.4 ms
Lead Channel Setting Sensing Sensitivity: 2 mV

## 2020-05-30 DIAGNOSIS — E538 Deficiency of other specified B group vitamins: Secondary | ICD-10-CM | POA: Diagnosis not present

## 2020-05-30 DIAGNOSIS — G4700 Insomnia, unspecified: Secondary | ICD-10-CM | POA: Diagnosis not present

## 2020-05-30 DIAGNOSIS — E162 Hypoglycemia, unspecified: Secondary | ICD-10-CM | POA: Diagnosis not present

## 2020-05-30 DIAGNOSIS — M5430 Sciatica, unspecified side: Secondary | ICD-10-CM | POA: Diagnosis not present

## 2020-05-30 DIAGNOSIS — M5416 Radiculopathy, lumbar region: Secondary | ICD-10-CM | POA: Diagnosis not present

## 2020-05-30 DIAGNOSIS — Z950 Presence of cardiac pacemaker: Secondary | ICD-10-CM | POA: Diagnosis not present

## 2020-05-30 DIAGNOSIS — K219 Gastro-esophageal reflux disease without esophagitis: Secondary | ICD-10-CM | POA: Diagnosis not present

## 2020-05-30 DIAGNOSIS — R69 Illness, unspecified: Secondary | ICD-10-CM | POA: Diagnosis not present

## 2020-05-30 DIAGNOSIS — Z8616 Personal history of COVID-19: Secondary | ICD-10-CM | POA: Diagnosis not present

## 2020-05-30 DIAGNOSIS — R413 Other amnesia: Secondary | ICD-10-CM | POA: Diagnosis not present

## 2020-05-30 DIAGNOSIS — R1310 Dysphagia, unspecified: Secondary | ICD-10-CM | POA: Diagnosis not present

## 2020-05-30 DIAGNOSIS — Z87440 Personal history of urinary (tract) infections: Secondary | ICD-10-CM | POA: Diagnosis not present

## 2020-05-30 DIAGNOSIS — D649 Anemia, unspecified: Secondary | ICD-10-CM | POA: Diagnosis not present

## 2020-05-30 DIAGNOSIS — M47812 Spondylosis without myelopathy or radiculopathy, cervical region: Secondary | ICD-10-CM | POA: Diagnosis not present

## 2020-05-30 DIAGNOSIS — E440 Moderate protein-calorie malnutrition: Secondary | ICD-10-CM | POA: Diagnosis not present

## 2020-05-30 DIAGNOSIS — F17210 Nicotine dependence, cigarettes, uncomplicated: Secondary | ICD-10-CM | POA: Diagnosis not present

## 2020-05-30 DIAGNOSIS — M6281 Muscle weakness (generalized): Secondary | ICD-10-CM | POA: Diagnosis not present

## 2020-05-30 DIAGNOSIS — Z8701 Personal history of pneumonia (recurrent): Secondary | ICD-10-CM | POA: Diagnosis not present

## 2020-05-30 DIAGNOSIS — R32 Unspecified urinary incontinence: Secondary | ICD-10-CM | POA: Diagnosis not present

## 2020-05-30 DIAGNOSIS — Z8541 Personal history of malignant neoplasm of cervix uteri: Secondary | ICD-10-CM | POA: Diagnosis not present

## 2020-06-01 DIAGNOSIS — F17210 Nicotine dependence, cigarettes, uncomplicated: Secondary | ICD-10-CM | POA: Diagnosis not present

## 2020-06-01 DIAGNOSIS — E162 Hypoglycemia, unspecified: Secondary | ICD-10-CM | POA: Diagnosis not present

## 2020-06-01 DIAGNOSIS — D649 Anemia, unspecified: Secondary | ICD-10-CM | POA: Diagnosis not present

## 2020-06-01 DIAGNOSIS — G4700 Insomnia, unspecified: Secondary | ICD-10-CM | POA: Diagnosis not present

## 2020-06-01 DIAGNOSIS — M5416 Radiculopathy, lumbar region: Secondary | ICD-10-CM | POA: Diagnosis not present

## 2020-06-01 DIAGNOSIS — R413 Other amnesia: Secondary | ICD-10-CM | POA: Diagnosis not present

## 2020-06-01 DIAGNOSIS — R69 Illness, unspecified: Secondary | ICD-10-CM | POA: Diagnosis not present

## 2020-06-01 DIAGNOSIS — Z8616 Personal history of COVID-19: Secondary | ICD-10-CM | POA: Diagnosis not present

## 2020-06-01 DIAGNOSIS — M5430 Sciatica, unspecified side: Secondary | ICD-10-CM | POA: Diagnosis not present

## 2020-06-01 DIAGNOSIS — R32 Unspecified urinary incontinence: Secondary | ICD-10-CM | POA: Diagnosis not present

## 2020-06-01 DIAGNOSIS — E440 Moderate protein-calorie malnutrition: Secondary | ICD-10-CM | POA: Diagnosis not present

## 2020-06-01 DIAGNOSIS — Z8701 Personal history of pneumonia (recurrent): Secondary | ICD-10-CM | POA: Diagnosis not present

## 2020-06-01 DIAGNOSIS — M47812 Spondylosis without myelopathy or radiculopathy, cervical region: Secondary | ICD-10-CM | POA: Diagnosis not present

## 2020-06-01 DIAGNOSIS — E538 Deficiency of other specified B group vitamins: Secondary | ICD-10-CM | POA: Diagnosis not present

## 2020-06-01 DIAGNOSIS — M6281 Muscle weakness (generalized): Secondary | ICD-10-CM | POA: Diagnosis not present

## 2020-06-01 DIAGNOSIS — Z950 Presence of cardiac pacemaker: Secondary | ICD-10-CM | POA: Diagnosis not present

## 2020-06-01 DIAGNOSIS — Z8541 Personal history of malignant neoplasm of cervix uteri: Secondary | ICD-10-CM | POA: Diagnosis not present

## 2020-06-01 DIAGNOSIS — K219 Gastro-esophageal reflux disease without esophagitis: Secondary | ICD-10-CM | POA: Diagnosis not present

## 2020-06-01 DIAGNOSIS — Z87440 Personal history of urinary (tract) infections: Secondary | ICD-10-CM | POA: Diagnosis not present

## 2020-06-01 DIAGNOSIS — R1310 Dysphagia, unspecified: Secondary | ICD-10-CM | POA: Diagnosis not present

## 2020-06-02 DIAGNOSIS — M5430 Sciatica, unspecified side: Secondary | ICD-10-CM | POA: Diagnosis not present

## 2020-06-02 DIAGNOSIS — D649 Anemia, unspecified: Secondary | ICD-10-CM | POA: Diagnosis not present

## 2020-06-02 DIAGNOSIS — Z950 Presence of cardiac pacemaker: Secondary | ICD-10-CM | POA: Diagnosis not present

## 2020-06-02 DIAGNOSIS — Z8616 Personal history of COVID-19: Secondary | ICD-10-CM | POA: Diagnosis not present

## 2020-06-02 DIAGNOSIS — M5416 Radiculopathy, lumbar region: Secondary | ICD-10-CM | POA: Diagnosis not present

## 2020-06-02 DIAGNOSIS — F17210 Nicotine dependence, cigarettes, uncomplicated: Secondary | ICD-10-CM | POA: Diagnosis not present

## 2020-06-02 DIAGNOSIS — R69 Illness, unspecified: Secondary | ICD-10-CM | POA: Diagnosis not present

## 2020-06-02 DIAGNOSIS — Z87440 Personal history of urinary (tract) infections: Secondary | ICD-10-CM | POA: Diagnosis not present

## 2020-06-02 DIAGNOSIS — R413 Other amnesia: Secondary | ICD-10-CM | POA: Diagnosis not present

## 2020-06-02 DIAGNOSIS — E538 Deficiency of other specified B group vitamins: Secondary | ICD-10-CM | POA: Diagnosis not present

## 2020-06-02 DIAGNOSIS — R32 Unspecified urinary incontinence: Secondary | ICD-10-CM | POA: Diagnosis not present

## 2020-06-02 DIAGNOSIS — E162 Hypoglycemia, unspecified: Secondary | ICD-10-CM | POA: Diagnosis not present

## 2020-06-02 DIAGNOSIS — M47812 Spondylosis without myelopathy or radiculopathy, cervical region: Secondary | ICD-10-CM | POA: Diagnosis not present

## 2020-06-02 DIAGNOSIS — Z8701 Personal history of pneumonia (recurrent): Secondary | ICD-10-CM | POA: Diagnosis not present

## 2020-06-02 DIAGNOSIS — G4700 Insomnia, unspecified: Secondary | ICD-10-CM | POA: Diagnosis not present

## 2020-06-02 DIAGNOSIS — Z8541 Personal history of malignant neoplasm of cervix uteri: Secondary | ICD-10-CM | POA: Diagnosis not present

## 2020-06-02 DIAGNOSIS — K219 Gastro-esophageal reflux disease without esophagitis: Secondary | ICD-10-CM | POA: Diagnosis not present

## 2020-06-02 DIAGNOSIS — M6281 Muscle weakness (generalized): Secondary | ICD-10-CM | POA: Diagnosis not present

## 2020-06-02 DIAGNOSIS — E440 Moderate protein-calorie malnutrition: Secondary | ICD-10-CM | POA: Diagnosis not present

## 2020-06-02 DIAGNOSIS — R1310 Dysphagia, unspecified: Secondary | ICD-10-CM | POA: Diagnosis not present

## 2020-06-03 NOTE — Progress Notes (Signed)
Remote pacemaker transmission.   

## 2020-06-07 DIAGNOSIS — F17210 Nicotine dependence, cigarettes, uncomplicated: Secondary | ICD-10-CM | POA: Diagnosis not present

## 2020-06-07 DIAGNOSIS — E162 Hypoglycemia, unspecified: Secondary | ICD-10-CM | POA: Diagnosis not present

## 2020-06-07 DIAGNOSIS — M5416 Radiculopathy, lumbar region: Secondary | ICD-10-CM | POA: Diagnosis not present

## 2020-06-07 DIAGNOSIS — R413 Other amnesia: Secondary | ICD-10-CM | POA: Diagnosis not present

## 2020-06-07 DIAGNOSIS — R32 Unspecified urinary incontinence: Secondary | ICD-10-CM | POA: Diagnosis not present

## 2020-06-07 DIAGNOSIS — Z8616 Personal history of COVID-19: Secondary | ICD-10-CM | POA: Diagnosis not present

## 2020-06-07 DIAGNOSIS — E538 Deficiency of other specified B group vitamins: Secondary | ICD-10-CM | POA: Diagnosis not present

## 2020-06-07 DIAGNOSIS — E440 Moderate protein-calorie malnutrition: Secondary | ICD-10-CM | POA: Diagnosis not present

## 2020-06-07 DIAGNOSIS — Z8701 Personal history of pneumonia (recurrent): Secondary | ICD-10-CM | POA: Diagnosis not present

## 2020-06-07 DIAGNOSIS — R1310 Dysphagia, unspecified: Secondary | ICD-10-CM | POA: Diagnosis not present

## 2020-06-07 DIAGNOSIS — R69 Illness, unspecified: Secondary | ICD-10-CM | POA: Diagnosis not present

## 2020-06-07 DIAGNOSIS — K219 Gastro-esophageal reflux disease without esophagitis: Secondary | ICD-10-CM | POA: Diagnosis not present

## 2020-06-07 DIAGNOSIS — M6281 Muscle weakness (generalized): Secondary | ICD-10-CM | POA: Diagnosis not present

## 2020-06-07 DIAGNOSIS — M5430 Sciatica, unspecified side: Secondary | ICD-10-CM | POA: Diagnosis not present

## 2020-06-07 DIAGNOSIS — Z8541 Personal history of malignant neoplasm of cervix uteri: Secondary | ICD-10-CM | POA: Diagnosis not present

## 2020-06-07 DIAGNOSIS — G4700 Insomnia, unspecified: Secondary | ICD-10-CM | POA: Diagnosis not present

## 2020-06-07 DIAGNOSIS — D649 Anemia, unspecified: Secondary | ICD-10-CM | POA: Diagnosis not present

## 2020-06-07 DIAGNOSIS — Z87440 Personal history of urinary (tract) infections: Secondary | ICD-10-CM | POA: Diagnosis not present

## 2020-06-07 DIAGNOSIS — M961 Postlaminectomy syndrome, not elsewhere classified: Secondary | ICD-10-CM | POA: Diagnosis not present

## 2020-06-07 DIAGNOSIS — Z950 Presence of cardiac pacemaker: Secondary | ICD-10-CM | POA: Diagnosis not present

## 2020-06-07 DIAGNOSIS — M47812 Spondylosis without myelopathy or radiculopathy, cervical region: Secondary | ICD-10-CM | POA: Diagnosis not present

## 2020-06-09 ENCOUNTER — Other Ambulatory Visit: Payer: Self-pay | Admitting: Family Medicine

## 2020-06-09 ENCOUNTER — Ambulatory Visit
Admission: RE | Admit: 2020-06-09 | Discharge: 2020-06-09 | Disposition: A | Discharge status: Home / Self Care | Payer: Medicare HMO | Source: Ambulatory Visit | Attending: Family Medicine | Admitting: Family Medicine

## 2020-06-09 DIAGNOSIS — K219 Gastro-esophageal reflux disease without esophagitis: Secondary | ICD-10-CM | POA: Diagnosis not present

## 2020-06-09 DIAGNOSIS — M5430 Sciatica, unspecified side: Secondary | ICD-10-CM | POA: Diagnosis not present

## 2020-06-09 DIAGNOSIS — Z8709 Personal history of other diseases of the respiratory system: Secondary | ICD-10-CM | POA: Diagnosis not present

## 2020-06-09 DIAGNOSIS — R69 Illness, unspecified: Secondary | ICD-10-CM | POA: Diagnosis not present

## 2020-06-09 DIAGNOSIS — M47812 Spondylosis without myelopathy or radiculopathy, cervical region: Secondary | ICD-10-CM | POA: Diagnosis not present

## 2020-06-09 DIAGNOSIS — M6281 Muscle weakness (generalized): Secondary | ICD-10-CM | POA: Diagnosis not present

## 2020-06-09 DIAGNOSIS — R1310 Dysphagia, unspecified: Secondary | ICD-10-CM | POA: Diagnosis not present

## 2020-06-09 DIAGNOSIS — M5416 Radiculopathy, lumbar region: Secondary | ICD-10-CM | POA: Diagnosis not present

## 2020-06-09 DIAGNOSIS — Z8541 Personal history of malignant neoplasm of cervix uteri: Secondary | ICD-10-CM | POA: Diagnosis not present

## 2020-06-09 DIAGNOSIS — J90 Pleural effusion, not elsewhere classified: Secondary | ICD-10-CM

## 2020-06-09 DIAGNOSIS — E538 Deficiency of other specified B group vitamins: Secondary | ICD-10-CM | POA: Diagnosis not present

## 2020-06-09 DIAGNOSIS — Z87440 Personal history of urinary (tract) infections: Secondary | ICD-10-CM | POA: Diagnosis not present

## 2020-06-09 DIAGNOSIS — Z8616 Personal history of COVID-19: Secondary | ICD-10-CM | POA: Diagnosis not present

## 2020-06-09 DIAGNOSIS — Z950 Presence of cardiac pacemaker: Secondary | ICD-10-CM | POA: Diagnosis not present

## 2020-06-09 DIAGNOSIS — R413 Other amnesia: Secondary | ICD-10-CM | POA: Diagnosis not present

## 2020-06-09 DIAGNOSIS — G4700 Insomnia, unspecified: Secondary | ICD-10-CM | POA: Diagnosis not present

## 2020-06-09 DIAGNOSIS — R32 Unspecified urinary incontinence: Secondary | ICD-10-CM | POA: Diagnosis not present

## 2020-06-09 DIAGNOSIS — Z8701 Personal history of pneumonia (recurrent): Secondary | ICD-10-CM | POA: Diagnosis not present

## 2020-06-09 DIAGNOSIS — E162 Hypoglycemia, unspecified: Secondary | ICD-10-CM | POA: Diagnosis not present

## 2020-06-09 DIAGNOSIS — D649 Anemia, unspecified: Secondary | ICD-10-CM | POA: Diagnosis not present

## 2020-06-09 DIAGNOSIS — E440 Moderate protein-calorie malnutrition: Secondary | ICD-10-CM | POA: Diagnosis not present

## 2020-06-09 DIAGNOSIS — F17210 Nicotine dependence, cigarettes, uncomplicated: Secondary | ICD-10-CM | POA: Diagnosis not present

## 2020-06-10 DIAGNOSIS — R6883 Chills (without fever): Secondary | ICD-10-CM | POA: Diagnosis not present

## 2020-06-16 ENCOUNTER — Ambulatory Visit: Payer: Medicare HMO | Admitting: Neurology

## 2020-06-16 ENCOUNTER — Encounter: Payer: Self-pay | Admitting: Physician Assistant

## 2020-06-16 ENCOUNTER — Telehealth (INDEPENDENT_AMBULATORY_CARE_PROVIDER_SITE_OTHER): Payer: Medicare HMO | Admitting: Physician Assistant

## 2020-06-16 DIAGNOSIS — R69 Illness, unspecified: Secondary | ICD-10-CM | POA: Diagnosis not present

## 2020-06-16 DIAGNOSIS — F411 Generalized anxiety disorder: Secondary | ICD-10-CM | POA: Diagnosis not present

## 2020-06-16 DIAGNOSIS — Z79899 Other long term (current) drug therapy: Secondary | ICD-10-CM

## 2020-06-16 DIAGNOSIS — F17200 Nicotine dependence, unspecified, uncomplicated: Secondary | ICD-10-CM | POA: Diagnosis not present

## 2020-06-16 DIAGNOSIS — G4700 Insomnia, unspecified: Secondary | ICD-10-CM | POA: Diagnosis not present

## 2020-06-16 DIAGNOSIS — F39 Unspecified mood [affective] disorder: Secondary | ICD-10-CM | POA: Diagnosis not present

## 2020-06-16 MED ORDER — ALPRAZOLAM 1 MG PO TABS: 1.0000 mg | ORAL_TABLET | Freq: Two times a day (BID) | ORAL | 1 refills | Status: DC | PRN

## 2020-06-16 MED ORDER — HYDROXYZINE HCL 25 MG PO TABS: 12.5000 mg | ORAL_TABLET | Freq: Every evening | ORAL | 1 refills | Status: DC | PRN

## 2020-06-16 NOTE — Progress Notes (Signed)
[facility] Check  Patient ID: [name],  MRN: [id number]  PCP: Lujean Amel, MD  Date of Evaluation: 06/16/2020  time spent:40 minutes  Chief Complaint:  Chief Complaint    Anxiety; Insomnia; Follow-up     Virtual Visit via Telehealth  I connected with patient by telephone, with their informed consent, and verified patient privacy and that I am speaking with the correct person using two identifiers.  I am private, in my office and the patient is at home.  I discussed the limitations, risks, security and privacy concerns of performing an evaluation and management service by telephone  and the availability of in person appointments. I also discussed with the patient that there may be a patient responsible charge related to this service. The patient expressed understanding and agreed to proceed.  Patient was listed on my schedule as a video visit.  She tells me that she is unable to do a video and that she called her insurance and was told that they will pay for a phone visit only.   I discussed the assessment and treatment plan with the patient. The patient was provided an opportunity to ask questions and all were answered. The patient agreed with the plan and demonstrated an understanding of the instructions.   The patient was advised to call back or seek an in-person evaluation if the symptoms worsen or if the condition fails to improve as anticipated.  I provided 40 minutes of non-face-to-face time during this encounter.   HISTORY/CURRENT STATUS: For routine med check.  Since our last visit 3 months ago she was admitted to the hospital again at the end of January this time, with septic pneumonia.  She went to inpatient rehab after that.  She says she was given 14 pills of Xanax afterwards (reviewed PDMP).  She is now back living in her home and her daughter is staying there with her.  Her chief complaint today is not sleeping.  Over the past 3 weeks or so she has  increased the Risperdal to 9 mg because it supposed to help her sleep.  It is not working.  She is having trouble falling asleep as well as staying asleep.  Not really sure when this difficulty started but does states she has a lot on her mind.  Not sure if she will be able to stay in her home or not.  She is now taking the Xanax 1 mg twice daily and half during the day on a daily basis.  She has been on Xanax for years.  She drinks 1 can of caffeinated soft drink immediately before bed.  Does feel "down."  But does not cry easily.  She does not have any energy because of her physical issues, not emotional problems.  Denies suicidal or homicidal thoughts.  Patient denies increased energy with decreased need for sleep, no increased talkativeness, no racing thoughts, no impulsivity or risky behaviors, no increased spending, no increased libido, no grandiosity, no increased irritability or anger, and no hallucinations.  Individual Medical History/ Review of Systems: Changes? :Yes   See HPI and records on chart.   Past medications for mental health diagnoses include: Trazodone, Belsomra, Risperdal, Zoloft, hydroxyzine, mirtazapine, Xanax, Valium, Wellbutrin, Abilify, gabapentin, Strattera, Vyvanse, Ambien, Lunesta, Dayvigo didn't help, Latuda  Allergies: Penicillins  Current Medications:  Current Outpatient Medications:  .  acetaminophen (TYLENOL) 325 MG tablet, Take 2 tablets (650 mg total) by mouth every 6 (six) hours as needed for mild pain (or Fever >/=  101)., Disp: , Rfl:  .  cyclobenzaprine (FLEXERIL) 10 MG tablet, Take 10 mg by mouth at bedtime as needed for muscle spasms., Disp: , Rfl:  .  feeding supplement (ENSURE ENLIVE / ENSURE PLUS) LIQD, Take 237 mLs by mouth 3 (three) times daily between meals., Disp: 237 mL, Rfl: 12 .  Nutritional Supplements (,FEEDING SUPPLEMENT, PROSOURCE PLUS) liquid, Take 30 mLs by mouth 2 (two) times daily between meals., Disp: , Rfl:  .  risperiDONE (RISPERDAL) 3  MG tablet, TAKE 1 TABLET BY MOUTH AT BEDTIME. (Patient taking differently: Take 9 mg by mouth at bedtime. Patient increased to 9 mg on her own about a month ago. Thought it would help her sleep.), Disp: 90 tablet, Rfl: 1 .  vitamin B-12 1000 MCG tablet, Take 1 tablet (1,000 mcg total) by mouth daily., Disp: , Rfl:  .  mirtazapine (REMERON) 7.5 MG tablet, Take 7.5 mg by mouth at bedtime. (Patient not taking: Reported on 06/16/2020), Disp: , Rfl:  .  Multiple Vitamin (MULTIVITAMIN WITH MINERALS) TABS tablet, Take 1 tablet by mouth daily. (Patient not taking: Reported on 06/16/2020), Disp: , Rfl:  Medication Side Effects: none  Family Medical/ Social History: Changes? No  MENTAL HEALTH EXAM:  There were no vitals taken for this visit.There is no height or weight on file to calculate BMI.  General Appearance: Unable to assess  Eye Contact:  Unable to assess  Speech:  Garbled and Slow  Volume:  Normal  Mood:  Euthymic  Affect:  Unable to assess  Thought Process:  Goal Directed and Descriptions of Associations: Intact  Orientation:  Full (Time, Place, and Person)  Thought Content: Logical   Suicidal Thoughts:  No  Homicidal Thoughts:  No  Memory:  WNL  Judgement:  Poor  Insight:  Lacking  Psychomotor Activity:  Unable to assess  Concentration:  Concentration: Fair and Attention Span: Fair  Recall:  Good  Fund of Knowledge: Good  Language: Good  Assets:  Desire for Improvement  ADL's:  Impaired  Cognition: WNL  Prognosis:  Poor   Most recent pertinent labs: 04/21/2020 glucose 112 see other labs on chart.  DIAGNOSES:    ICD-10-CM   1. Insomnia, unspecified type  G47.00   2. Episodic mood disorder (HCC)  F39   3. Generalized anxiety disorder  F41.1   4. Smoker  F17.200   5. Polypharmacy  Z79.899     Receiving Psychotherapy: No    RECOMMENDATIONS:  PDMP was reviewed. I provided 40 minutes of nonface-to-face time during this encounter, including record review and charting. Upon  further questioning, she cannot really tell me how long she has been taking Risperdal 9 mg.  It could have been a week or so or who knows how long.  I reminded her not to change the way she takes the medications without discussing with me.  This medication is not a sleeping pill although it can make people drowsy sometimes.  She should go back to 3 mg which is 1 pill of what she has.  She should call the office if she ever has any questions.  She needs to stop drinking caffeine after lunch.  Having a caffeinated drink right before bed is asking for insomnia.   States the Xanax is helping with anxiety.  She and I both are aware that any benzodiazepine increases the risk of falls in the elderly, and I wish I could wean her completely off of it.  She has been on Xanax or some benzo  for years which makes it very difficult.  She understands the fall risks and accepts.  She does not want to decrease the dose. Smoking cessation was discussed.  Not ready to quit. Decrease Risperdal 3 mg, back to 1 p.o. nightly. Continue Xanax 1 mg to 1 p.o. twice daily and one half p.o. during midday as needed anxiety, no more than 2.5 pills/day. Start hydroxyzine 25 mg, 1/2-1 p.o. nightly as needed sleep. Return in 2 months.  Donnal Moat, PA-C

## 2020-06-22 ENCOUNTER — Telehealth: Payer: Self-pay | Admitting: Physician Assistant

## 2020-06-22 NOTE — Telephone Encounter (Signed)
Rtc to pt to get more info and she just thinks she needs an increase because she wants to sleep longer.

## 2020-06-22 NOTE — Telephone Encounter (Signed)
Pt called and said that she is on hydroxyzine 25 mg for sleep. She is only sleeping six hours and she would like to increase the medicine. [facility] [address]. Please give her a call at [phone]

## 2020-06-23 ENCOUNTER — Other Ambulatory Visit: Payer: Self-pay | Admitting: Physician Assistant

## 2020-06-23 MED ORDER — HYDROXYZINE HCL 25 MG PO TABS
37.5000 mg | ORAL_TABLET | Freq: Every evening | ORAL | 1 refills | Status: DC | PRN
Start: 2020-06-23 — End: 2020-07-21

## 2020-06-23 NOTE — Telephone Encounter (Signed)
Please let her know I sent in a new Rx, same dose and I gave a higher quantity. Have her take 1.5-2 pills qhs prn.

## 2020-06-23 NOTE — Telephone Encounter (Signed)
Pt informed

## 2020-06-30 DIAGNOSIS — M5416 Radiculopathy, lumbar region: Secondary | ICD-10-CM | POA: Diagnosis not present

## 2020-07-06 ENCOUNTER — Telehealth: Payer: Self-pay | Admitting: Physician Assistant

## 2020-07-06 NOTE — Telephone Encounter (Signed)
Let's restart the Mirtazepine 7.5 mg, #30, 1 po qhs. RF 1.  Vivien Rota, please either call it in or send. Thanks

## 2020-07-06 NOTE — Telephone Encounter (Signed)
Please review

## 2020-07-06 NOTE — Telephone Encounter (Signed)
Pt called and said that the increase of the hydroxycine is not working . She is sleeping less than six hours. Please call her with what to do at [phone]

## 2020-07-07 ENCOUNTER — Other Ambulatory Visit: Payer: Self-pay

## 2020-07-07 MED ORDER — MIRTAZAPINE 7.5 MG PO TABS: 7.5000 mg | ORAL_TABLET | Freq: Every day | ORAL | 1 refills | Status: DC

## 2020-07-07 NOTE — Telephone Encounter (Signed)
Noted  

## 2020-07-07 NOTE — Telephone Encounter (Signed)
Rx sent and pt informed

## 2020-07-14 ENCOUNTER — Telehealth: Payer: Self-pay | Admitting: Physician Assistant

## 2020-07-14 NOTE — Telephone Encounter (Signed)
Please review

## 2020-07-14 NOTE — Telephone Encounter (Signed)
Pt informed

## 2020-07-14 NOTE — Telephone Encounter (Signed)
Have her take 2 po qs, about 30 mins before wanting to go to sleep. Can take it w/ melatonin up to 10 mg. Also, no tv or electronics within 2 hours before bed.

## 2020-07-14 NOTE — Telephone Encounter (Signed)
Camber called in stating that the Mirtazpine 7.5mg  isn't working and that she's not sleeping at all. She has been on the medication for about a week. She would like to know could she try something else. Pls RTC [phone]

## 2020-07-19 ENCOUNTER — Other Ambulatory Visit: Payer: Self-pay | Admitting: Physician Assistant

## 2020-07-20 ENCOUNTER — Telehealth: Payer: Self-pay | Admitting: Physician Assistant

## 2020-07-20 NOTE — Telephone Encounter (Signed)
Next visit is 08/25/20. [name] called again about her Mirtazapine. She said that it is not helping her sleep at all. She doesn't recall the message called to her previously on 4/28. Can one of you please call her and reiterate the message attached. Her number is [phone].

## 2020-07-21 ENCOUNTER — Other Ambulatory Visit: Payer: Self-pay | Admitting: Physician Assistant

## 2020-07-21 MED ORDER — HYDROXYZINE HCL 50 MG PO TABS: 50.0000 mg | ORAL_TABLET | Freq: Every evening | ORAL | 1 refills | Status: DC | PRN

## 2020-07-21 NOTE — Telephone Encounter (Signed)
Preferred Pharmacy  [id number] [place], [place] - [address] I informed her to stop.

## 2020-07-21 NOTE — Telephone Encounter (Signed)
Rtc to pt.She said she has been taking 2 at night as instructed about 30 mins before going to sleep along with melatonin.Its still not working for her.Please advise

## 2020-07-21 NOTE — Telephone Encounter (Signed)
Double check the Hydroxyzine dose, increase it by 25 mg, (example if she is on 25 mg now go up to 50 mg now every night for 1 week) if that is not effective, go up to 75 mg every night for a week and then can go up to 100 mg q hs 30 mins before needing to go to sleep.  Tell her going up to 100 mg will be the last resort though.  If she knows it safe to do that, she will go straight to 100 mg and she may not need it. She can continue the mirtazapine at the same dose and melatonin up to 10 mg.

## 2020-07-21 NOTE — Telephone Encounter (Signed)
She will need a Rx sent for Hydroxyzine she was previously on 25 mg 1.5-2 tabs nightly but you had her stop when she started mirtazapine.Do you want her to take hydroxyzine with the mirtazapine?

## 2020-07-21 NOTE — Telephone Encounter (Signed)
Prescription and was sent.

## 2020-07-21 NOTE — Telephone Encounter (Signed)
Stop the Mirtazepine.  Need pharmacy please.

## 2020-07-22 ENCOUNTER — Telehealth: Payer: Self-pay | Admitting: Physician Assistant

## 2020-07-22 ENCOUNTER — Other Ambulatory Visit: Payer: Self-pay | Admitting: Physician Assistant

## 2020-07-22 NOTE — Telephone Encounter (Signed)
Yes and she has a refill on the prescription that was sent on 06/16/2020.

## 2020-07-22 NOTE — Telephone Encounter (Signed)
Pt informed

## 2020-07-22 NOTE — Telephone Encounter (Signed)
[name] tried the Hydroxyzine last night and states it is not helping her sleep. She did take two Xanax after she tried the Hydroxyzine and then she feel asleep. Her phone number is [phone].

## 2020-07-22 NOTE — Telephone Encounter (Signed)
Addendum to previous 07/22/20 note.[name] has called about 5 times asking when someone will call her back. She is concerned that she wont get the call back before she goes out of town tomorrow morning. I told her someone will return her call about the medication not working for her sleeping issues.

## 2020-07-22 NOTE — Telephone Encounter (Signed)
Spoke to pt

## 2020-07-22 NOTE — Telephone Encounter (Signed)
She wants to know should she keep taking Xanax?If so she [facility] [address]

## 2020-07-22 NOTE — Telephone Encounter (Signed)
Addendum to previous 07/22/20

## 2020-07-22 NOTE — Telephone Encounter (Signed)
Please review

## 2020-07-22 NOTE — Telephone Encounter (Signed)
Keep doing what she's doing. Make sure she's not napping during the day, cut out all caffeine, no electronic devices after 7 PM unless she uses blue blocker glasses, drink SleepyTime tea.

## 2020-08-01 ENCOUNTER — Telehealth: Payer: Self-pay | Admitting: Physician Assistant

## 2020-08-01 DIAGNOSIS — M5416 Radiculopathy, lumbar region: Secondary | ICD-10-CM | POA: Diagnosis not present

## 2020-08-01 NOTE — Telephone Encounter (Signed)
Let's add Seroquel. Please get pharmacy info and I'll send it in. It's in the same class of drugs as Risperdal so has the same risks of possible hyperlipidemia and hyperglycemia.  I will start her at a low dose though and we will discuss in detail at her next visit.  Please remind her this is not a "sleeping pill" but one of the good side effects is somnolence.  Thank you.

## 2020-08-01 NOTE — Telephone Encounter (Signed)
Please review

## 2020-08-01 NOTE — Telephone Encounter (Signed)
Pt called and said that the hydroxyzine and the xanax only worked for two night s. She is has tried all the suggestions you offered with no success. Please call with another solution at [phone]

## 2020-08-02 ENCOUNTER — Other Ambulatory Visit: Payer: Self-pay | Admitting: Physician Assistant

## 2020-08-02 MED ORDER — QUETIAPINE FUMARATE 25 MG PO TABS: 25.0000 mg | ORAL_TABLET | Freq: Every day | ORAL | 1 refills | Status: DC

## 2020-08-02 NOTE — Telephone Encounter (Signed)
[facility] [address]

## 2020-08-02 NOTE — Telephone Encounter (Signed)
Prescription was sent

## 2020-08-11 DIAGNOSIS — H04203 Unspecified epiphora, bilateral lacrimal glands: Secondary | ICD-10-CM | POA: Diagnosis not present

## 2020-08-11 DIAGNOSIS — M5416 Radiculopathy, lumbar region: Secondary | ICD-10-CM | POA: Diagnosis not present

## 2020-08-12 ENCOUNTER — Other Ambulatory Visit: Payer: Self-pay | Admitting: Physician Assistant

## 2020-08-12 DIAGNOSIS — M545 Low back pain, unspecified: Secondary | ICD-10-CM | POA: Diagnosis not present

## 2020-08-15 ENCOUNTER — Other Ambulatory Visit: Payer: Self-pay | Admitting: Physician Assistant

## 2020-08-16 DIAGNOSIS — M5136 Other intervertebral disc degeneration, lumbar region: Secondary | ICD-10-CM | POA: Insufficient documentation

## 2020-08-19 ENCOUNTER — Telehealth: Payer: Self-pay | Admitting: Physician Assistant

## 2020-08-19 ENCOUNTER — Other Ambulatory Visit: Payer: Self-pay | Admitting: Physician Assistant

## 2020-08-19 MED ORDER — ALPRAZOLAM 1 MG PO TABS: 1.0000 mg | ORAL_TABLET | Freq: Two times a day (BID) | ORAL | 0 refills | Status: DC | PRN

## 2020-08-19 NOTE — Telephone Encounter (Signed)
Pt would like a refill on Xanax. Pt will be leaving going out of town on Monday. [facility] [address]

## 2020-08-19 NOTE — Telephone Encounter (Signed)
Pt informed

## 2020-08-19 NOTE — Telephone Encounter (Signed)
Last filled 07/22/20 ok to get early?

## 2020-08-19 NOTE — Telephone Encounter (Signed)
[facility] [address] [address] (I don't think there is one, and I know I've sent things to [facility])  She can have it filled tomorrow

## 2020-08-25 ENCOUNTER — Telehealth: Payer: Medicare HMO | Admitting: Physician Assistant

## 2020-08-25 ENCOUNTER — Other Ambulatory Visit: Payer: Self-pay | Admitting: Physician Assistant

## 2020-08-28 ENCOUNTER — Other Ambulatory Visit: Payer: Self-pay | Admitting: Physician Assistant

## 2020-09-05 ENCOUNTER — Ambulatory Visit (INDEPENDENT_AMBULATORY_CARE_PROVIDER_SITE_OTHER): Payer: Medicare HMO

## 2020-09-05 DIAGNOSIS — I495 Sick sinus syndrome: Secondary | ICD-10-CM

## 2020-09-05 LAB — CUP PACEART REMOTE DEVICE CHECK
Battery Impedance: 379 Ohm
Battery Remaining Longevity: 120 mo
Battery Voltage: 2.79 V
Brady Statistic AP VP Percent: 0 %
Brady Statistic AP VS Percent: 0 %
Brady Statistic AS VP Percent: 2 %
Brady Statistic AS VS Percent: 98 %
Date Time Interrogation Session: 20220617161221
Implantable Lead Implant Date: 20141205
Implantable Lead Implant Date: 20141205
Implantable Lead Location: 753859
Implantable Lead Location: 753860
Implantable Lead Model: 5076
Implantable Lead Model: 5076
Implantable Pulse Generator Implant Date: 20141205
Lead Channel Impedance Value: 409 Ohm
Lead Channel Impedance Value: 485 Ohm
Lead Channel Pacing Threshold Amplitude: 0.375 V
Lead Channel Pacing Threshold Amplitude: 1.125 V
Lead Channel Pacing Threshold Pulse Width: 0.4 ms
Lead Channel Pacing Threshold Pulse Width: 0.4 ms
Lead Channel Setting Pacing Amplitude: 2 V
Lead Channel Setting Pacing Amplitude: 2.75 V
Lead Channel Setting Pacing Pulse Width: 0.4 ms
Lead Channel Setting Sensing Sensitivity: 2 mV

## 2020-09-08 ENCOUNTER — Other Ambulatory Visit: Payer: Self-pay | Admitting: Physician Assistant

## 2020-09-09 ENCOUNTER — Other Ambulatory Visit: Payer: Self-pay | Admitting: Physician Assistant

## 2020-09-09 NOTE — Telephone Encounter (Signed)
Looks like pt has both strengths listed 25 mg and 50 mg. Okay to d/c 25 mg?

## 2020-09-15 DIAGNOSIS — I495 Sick sinus syndrome: Secondary | ICD-10-CM | POA: Diagnosis not present

## 2020-09-15 DIAGNOSIS — R69 Illness, unspecified: Secondary | ICD-10-CM | POA: Diagnosis not present

## 2020-09-15 DIAGNOSIS — R636 Underweight: Secondary | ICD-10-CM | POA: Diagnosis not present

## 2020-09-15 DIAGNOSIS — M2550 Pain in unspecified joint: Secondary | ICD-10-CM | POA: Diagnosis not present

## 2020-09-15 DIAGNOSIS — R030 Elevated blood-pressure reading, without diagnosis of hypertension: Secondary | ICD-10-CM | POA: Diagnosis not present

## 2020-09-15 DIAGNOSIS — I739 Peripheral vascular disease, unspecified: Secondary | ICD-10-CM | POA: Diagnosis not present

## 2020-09-15 DIAGNOSIS — Z008 Encounter for other general examination: Secondary | ICD-10-CM | POA: Diagnosis not present

## 2020-09-15 DIAGNOSIS — Z681 Body mass index (BMI) 19 or less, adult: Secondary | ICD-10-CM | POA: Diagnosis not present

## 2020-09-15 DIAGNOSIS — G8929 Other chronic pain: Secondary | ICD-10-CM | POA: Diagnosis not present

## 2020-09-16 ENCOUNTER — Other Ambulatory Visit: Payer: Self-pay | Admitting: *Deleted

## 2020-09-16 NOTE — Patient Outreach (Addendum)
[facility]) Care Management  09/16/2020  [name] 08/15/1955 [id number]   [facility] Telephone Assessment/Screen for [name] MA HTN referral  Referral Date: 09/01/20 Referral Source: [name] MA Reason for Referral: 3 admits, 2 ED visits, Depression; RN CC  Insurance: [place] access  Last [place] to admissions- 04/16/20 for cystitis, 02/21/20 pneumonia (PNA)  12/17/19 PNA  [place] 03/22/20 muscle weakness of lower extremity, fatigue after discharged from rehab    Outreach attempt #1 successful to [phone] Pt is very hard of hearing Patient is able to verify HIPAA, DOB and address Reviewed and addressed referral to [facility] with patient Consent: THN RN CM reviewed [facility] services with patient. Patient gave verbal consent for services [facility] telephonic RN CM.  She confirms she does not monitor her blood pressure (BP) at home   and agrees to be mailed a BP monitor Hypotension side effects and possible safety concerns discussed   Patient Active Problem List   Diagnosis Date Noted   Sepsis secondary to UTI ([place]) 04/17/2020   COVID-19 virus infection 04/17/2020   Hypoglycemia without diagnosis of diabetes mellitus 04/17/2020   Sepsis due to pneumonia ([place]) 04/17/2020   Physical deconditioning    Myositis 03/03/2020   HCAP (healthcare-associated pneumonia) 02/21/2020   Cough 02/21/2020   Generalized weakness 02/21/2020   GAD (generalized anxiety disorder) 02/21/2020   Proximal muscle weakness 02/08/2020   Dysphagia 02/08/2020   Gait disturbance 01/04/2020   Elevated CK 01/04/2020   Weakness of both lower extremities 01/04/2020   Encephalopathy    Polypharmacy    CAP (community acquired pneumonia) 12/18/2019   Fall 12/18/2019   Hypokalemia    Abnormal liver function test    Cervical spondylosis 09/23/2019   Bilateral elbow joint pain 08/26/2019   Memory loss 07/02/2019   Depression with anxiety 07/02/2019   B12 deficiency 07/02/2019   Lumbar  radiculopathy 02/10/2019   History of fusion of lumbar spine 02/10/2019   Dysesthesia 02/10/2019   Retroperitoneal fibrosis    Pelvic adhesions    Left ovarian cyst 11/17/2018   Elevated tumor markers 11/17/2018   Dyspnea 09/15/2018   Chest pain of uncertain etiology 90/24/0973   Laryngopharyngeal reflux (LPR) 05/19/2018   Bipolar disorder ([place]) 04/16/2018   Insomnia 04/16/2018   Attention deficit hyperactivity disorder (ADHD) 04/16/2018   Neck pain on right side 09/09/2014   Pseudoarthrosis of lumbar spine 04/15/2014   Family history of arrhythmogenic right ventricular cardiomyopathy 03/30/2013   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 02/20/2013   Sinus pause 02/14/2013   Hx of syncope- s/p Loop recorder 11/06/12 10/29/2012   Vertigo 10/29/2012   Tobacco abuse 10/29/2012   ANXIETY 11/14/2009   CERUMEN IMPACTION, RIGHT 11/14/2009   BENIGN POSITIONAL VERTIGO 11/14/2009   EAR PAIN, RIGHT 11/14/2009   SCIATICA 05/09/2009   Current Outpatient Medications on File Prior to Visit  Medication Sig Dispense Refill   acetaminophen (TYLENOL) 325 MG tablet Take 2 tablets (650 mg total) by mouth every 6 (six) hours as needed for mild pain (or Fever >/= 101).     ALPRAZolam (XANAX) 1 MG tablet Take 1 tablet (1 mg total) by mouth 2 (two) times daily as needed for anxiety. +1/2 p.o. during the day as needed anxiety 75 tablet 0   cyclobenzaprine (FLEXERIL) 10 MG tablet Take 10 mg by mouth at bedtime as needed for muscle spasms.     feeding supplement (ENSURE ENLIVE / ENSURE PLUS) LIQD Take 237 mLs by mouth 3 (  three) times daily between meals. 237 mL 12   hydrOXYzine (ATARAX/VISTARIL) 50 MG tablet TAKE 1-2 TABLETS (50-100 MG TOTAL) BY MOUTH AT BEDTIME AS NEEDED. 180 tablet 1   Multiple Vitamin (MULTIVITAMIN WITH MINERALS) TABS tablet Take 1 tablet by mouth daily. (Patient not taking: Reported on 06/16/2020)     Nutritional Supplements (,FEEDING SUPPLEMENT, PROSOURCE PLUS) liquid Take 30  mLs by mouth 2 (two) times daily between meals.     QUEtiapine (SEROQUEL) 25 MG tablet TAKE 1 TABLET BY MOUTH EVERYDAY AT BEDTIME 90 tablet 0   risperiDONE (RISPERDAL) 3 MG tablet TAKE 1 TABLET BY MOUTH AT BEDTIME. (Patient taking differently: Take 9 mg by mouth at bedtime. Patient increased to 9 mg on her own about a month ago. Thought it would help her sleep.) 90 tablet 1   vitamin B-12 1000 MCG tablet Take 1 tablet (1,000 mcg total) by mouth daily.     No current facility-administered medications on file prior to visit.     Plan: To have [place] mail patient a BP cuff via mail Sent EMMIs via e-mail for hypertension, low blood pressure and checking your blood pressure at home  Patient agrees to the care plan and follow up within the next 30 business days Left [facility]N RN CM outreach number    Goals Addressed               This Visit's Progress     Patient Stated     [facility]) Track and Manage My Blood Pressure-Hypertension (pt-stated)   Not on track     Timeframe:  Long-Range Goal Priority:  Low Start Date:          09/16/20                   Expected End Date:        12/19/20               Follow Up Date 10/11/20    - choose a place to take my blood pressure (home, clinic or office, retail store)  -check Blood pressure (frequency) as ordered    Notes:  09/16/20 not checking BP at home, before taking medicines, reports no BP monitor, agrees to allow [facility] RN CM to request she be sent a BP monitor Voiced understanding of hyper and hypotension symptoms and safety/home interventions         Mazikeen Hehn L. Lavina Hamman, RN, BSN, [place] Coordinator Office number [phone] Mobile number [phone]  Main [facility]N number [phone] Fax number [phone]

## 2020-09-21 DIAGNOSIS — Z981 Arthrodesis status: Secondary | ICD-10-CM | POA: Diagnosis not present

## 2020-09-21 DIAGNOSIS — M79604 Pain in right leg: Secondary | ICD-10-CM | POA: Diagnosis not present

## 2020-09-21 DIAGNOSIS — M25559 Pain in unspecified hip: Secondary | ICD-10-CM | POA: Diagnosis not present

## 2020-09-21 DIAGNOSIS — M160 Bilateral primary osteoarthritis of hip: Secondary | ICD-10-CM | POA: Diagnosis not present

## 2020-09-21 DIAGNOSIS — M549 Dorsalgia, unspecified: Secondary | ICD-10-CM | POA: Diagnosis not present

## 2020-09-21 DIAGNOSIS — M25569 Pain in unspecified knee: Secondary | ICD-10-CM | POA: Diagnosis not present

## 2020-09-21 DIAGNOSIS — M609 Myositis, unspecified: Secondary | ICD-10-CM | POA: Diagnosis not present

## 2020-09-23 ENCOUNTER — Other Ambulatory Visit: Payer: Self-pay | Admitting: Family Medicine

## 2020-09-23 ENCOUNTER — Other Ambulatory Visit: Payer: Self-pay

## 2020-09-23 ENCOUNTER — Telehealth: Payer: Self-pay | Admitting: Physician Assistant

## 2020-09-23 ENCOUNTER — Other Ambulatory Visit: Payer: Self-pay | Admitting: Acute Care

## 2020-09-23 DIAGNOSIS — I739 Peripheral vascular disease, unspecified: Secondary | ICD-10-CM

## 2020-09-23 DIAGNOSIS — Z122 Encounter for screening for malignant neoplasm of respiratory organs: Secondary | ICD-10-CM

## 2020-09-23 MED ORDER — ALPRAZOLAM 1 MG PO TABS: 1.0000 mg | ORAL_TABLET | Freq: Two times a day (BID) | ORAL | 0 refills | Status: DC | PRN

## 2020-09-23 NOTE — Telephone Encounter (Signed)
Pended.

## 2020-09-23 NOTE — Telephone Encounter (Signed)
Next visit is 09/30/20. Requesting refill on Xanax 1 mg called to:  [id number] [place], [place]  Phone:  [phone]  Fax:  [phone]

## 2020-09-23 NOTE — Progress Notes (Signed)
Remote pacemaker transmission.   

## 2020-09-26 ENCOUNTER — Other Ambulatory Visit: Payer: Self-pay

## 2020-09-26 NOTE — Telephone Encounter (Signed)
Pt informed

## 2020-09-26 NOTE — Telephone Encounter (Signed)
Addendum to previous message on 09/23/20. Izel called to get the status of her refill on Xanax. Her phone number is [phone].  [id number] [place] [place]  Phone:  [phone]  Fax:  [phone]

## 2020-09-26 NOTE — Telephone Encounter (Signed)
Pended.

## 2020-09-26 NOTE — Telephone Encounter (Signed)
Dr. Clovis Pu sent this in on 09/23/2020

## 2020-09-27 DIAGNOSIS — M5416 Radiculopathy, lumbar region: Secondary | ICD-10-CM | POA: Diagnosis not present

## 2020-09-30 ENCOUNTER — Ambulatory Visit: Payer: Medicare HMO | Admitting: Physician Assistant

## 2020-10-04 ENCOUNTER — Ambulatory Visit
Admission: RE | Admit: 2020-10-04 | Discharge: 2020-10-04 | Disposition: A | Discharge status: Home / Self Care | Payer: Medicare HMO | Source: Ambulatory Visit | Attending: Family Medicine | Admitting: Family Medicine

## 2020-10-04 ENCOUNTER — Other Ambulatory Visit: Payer: Self-pay | Admitting: Family Medicine

## 2020-10-04 DIAGNOSIS — M79662 Pain in left lower leg: Secondary | ICD-10-CM | POA: Diagnosis not present

## 2020-10-04 DIAGNOSIS — I739 Peripheral vascular disease, unspecified: Secondary | ICD-10-CM

## 2020-10-04 DIAGNOSIS — M79661 Pain in right lower leg: Secondary | ICD-10-CM | POA: Diagnosis not present

## 2020-10-04 DIAGNOSIS — I998 Other disorder of circulatory system: Secondary | ICD-10-CM | POA: Diagnosis not present

## 2020-10-05 DIAGNOSIS — M79604 Pain in right leg: Secondary | ICD-10-CM | POA: Diagnosis not present

## 2020-10-05 DIAGNOSIS — M25559 Pain in unspecified hip: Secondary | ICD-10-CM | POA: Diagnosis not present

## 2020-10-05 DIAGNOSIS — M609 Myositis, unspecified: Secondary | ICD-10-CM | POA: Diagnosis not present

## 2020-10-05 DIAGNOSIS — M25569 Pain in unspecified knee: Secondary | ICD-10-CM | POA: Diagnosis not present

## 2020-10-05 DIAGNOSIS — M549 Dorsalgia, unspecified: Secondary | ICD-10-CM | POA: Diagnosis not present

## 2020-10-11 ENCOUNTER — Encounter: Payer: Self-pay | Admitting: *Deleted

## 2020-10-11 ENCOUNTER — Other Ambulatory Visit: Payer: Self-pay

## 2020-10-11 ENCOUNTER — Other Ambulatory Visit: Payer: Self-pay | Admitting: *Deleted

## 2020-10-11 NOTE — Patient Outreach (Signed)
[facility]) Care Management  10/11/2020  [name] 11/24/1955 [id number]   [facility] Telephone Assessment/Screen for [name] MA HTN referral  [name] was referred to [facility] on 09/01/20 by [name] MA Reason for Referral: 3 admits, 2 ED visits, Depression; RN CC  Insurance: [place] access   Last [place] to admissions- 04/16/20 for cystitis, 02/21/20 pneumonia (PNA)  12/17/19 PNA [place] 03/22/20 muscle weakness of lower extremity, fatigue after discharged from rehab       Outreach attempt #2 successful to [phone] Pt is very hard of hearing Patient is able to verify HIPAA, DOB and address Reviewed and addressed referral to [facility] with patient Consent: THN RN CM reviewed [facility] services with patient. Patient gave verbal consent for services [facility] telephonic RN CM.   Hypertension She confirms she did received the mailed blood pressure (BP) for home use   No recent Hypotension symptoms    Anxiety but is followed by medical provider, T Adelene Idler  Denies care coordination needs Denies any worsening or new cardiac/pace maker symptoms Has not received [facility] welcome package yet nor e-mailed EMMIs THN progression discussed She agrees to follow up in 60-90 business days with education information to be offered  Patient Active Problem List   Diagnosis Date Noted   Degeneration of lumbar intervertebral disc 08/16/2020   Sepsis secondary to UTI ([place]) 04/17/2020   COVID-19 virus infection 04/17/2020   Hypoglycemia without diagnosis of diabetes mellitus 04/17/2020   Sepsis due to pneumonia ([place]) 04/17/2020   Physical deconditioning    Myositis 03/03/2020   HCAP (healthcare-associated pneumonia) 02/21/2020   Cough 02/21/2020   Generalized weakness 02/21/2020   GAD (generalized anxiety disorder) 02/21/2020   Proximal muscle weakness 02/08/2020   Dysphagia 02/08/2020   Gait disturbance 01/04/2020   Elevated CK 01/04/2020   Weakness of both lower  extremities 01/04/2020   Encephalopathy    Polypharmacy    CAP (community acquired pneumonia) 12/18/2019   Fall 12/18/2019   Hypokalemia    Abnormal liver function test    Cervical post-laminectomy syndrome 10/21/2019   Cervical spondylosis 09/23/2019   Bilateral elbow joint pain 08/26/2019   Memory loss 07/02/2019   Depression with anxiety 07/02/2019   B12 deficiency 07/02/2019   Lumbar radiculopathy 02/10/2019   History of fusion of lumbar spine 02/10/2019   Dysesthesia 02/10/2019   Retroperitoneal fibrosis    Pelvic adhesions    Left ovarian cyst 11/17/2018   Elevated tumor markers 11/17/2018   Dyspnea 09/15/2018   Chest pain of uncertain etiology [id number]   Laryngopharyngeal reflux (LPR) 05/19/2018   Bipolar disorder ([place]) 04/16/2018   Insomnia 04/16/2018   Attention deficit hyperactivity disorder (ADHD) 04/16/2018   Neck pain on right side 09/09/2014   Pseudoarthrosis of lumbar spine 04/15/2014   Family history of arrhythmogenic right ventricular cardiomyopathy 03/30/2013   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 02/20/2013   Sinus pause 02/14/2013   Hx of syncope- s/p Loop recorder 11/06/12 10/29/2012   Vertigo 10/29/2012   Tobacco abuse 10/29/2012   ANXIETY 11/14/2009   CERUMEN IMPACTION, RIGHT 11/14/2009   BENIGN POSITIONAL VERTIGO 11/14/2009   EAR PAIN, RIGHT 11/14/2009   SCIATICA 05/09/2009    Plans Re sent  via EMMI education materials on HTN, low blood pressure and checking your blood pressure at home after she verified her e-mail address  Patient agrees to care plan and follow up within the next 60-90 business days after discussed progression  Encouraged her  to outreach to RN CM if she does not receive her [facility] welcome package in the next 2 weeks   Goals Addressed               This Visit's Progress     Patient Stated     [facility]) Track and Manage My Blood Pressure-Hypertension (pt-stated)   On track     Timeframe:  Long-Range  Goal Priority:  Low Start Date:          09/16/20                   Expected End Date:        12/19/20               Follow Up Date 12/12/20    - choose a place to take my blood pressure (home, clinic or office, retail store)  -check Blood pressure (frequency) as ordered   Notes: 10/11/20 received BP cuff Denies hypotensive episodes since last outreach Pending receiving welcome package and EMMIs per pt. EMMIs re emailed after verified e-mail address  09/16/20 not checking BP at home, before taking medicines, reports no BP monitor, agrees to allow [facility] RN CM to request she be sent a BP monitor Voiced understanding of hyper and hypotension symptoms and safety/home interventions        Daelan Gatt L. Lavina Hamman, RN, BSN, [place] Coordinator Office number [phone] Main [facility] number [phone] Fax number [phone]

## 2020-10-13 ENCOUNTER — Inpatient Hospital Stay: Admission: RE | Admit: 2020-10-13 | Payer: Medicare HMO | Source: Ambulatory Visit

## 2020-10-19 ENCOUNTER — Telehealth: Payer: Self-pay | Admitting: Physician Assistant

## 2020-10-19 NOTE — Telephone Encounter (Signed)
Have her stop Seroquel, stay on Risperdal and all other meds. Tell her she must come to the office for the next appt. I need to evaluate her movements.

## 2020-10-19 NOTE — Telephone Encounter (Signed)
Pt stated she thinks the shaking may be coming from the Seroquel

## 2020-10-19 NOTE — Telephone Encounter (Signed)
Next visit is 10/26/20. [place] called to say she is having shaking really bad from one of her medications she thinks. Her phone number is [phone].

## 2020-10-20 NOTE — Telephone Encounter (Signed)
Pt informed

## 2020-10-26 ENCOUNTER — Other Ambulatory Visit: Payer: Self-pay

## 2020-10-26 ENCOUNTER — Ambulatory Visit: Payer: Medicare HMO | Admitting: Physician Assistant

## 2020-10-26 ENCOUNTER — Encounter: Payer: Self-pay | Admitting: Physician Assistant

## 2020-10-26 DIAGNOSIS — F411 Generalized anxiety disorder: Secondary | ICD-10-CM | POA: Diagnosis not present

## 2020-10-26 DIAGNOSIS — Z79899 Other long term (current) drug therapy: Secondary | ICD-10-CM | POA: Diagnosis not present

## 2020-10-26 DIAGNOSIS — F39 Unspecified mood [affective] disorder: Secondary | ICD-10-CM

## 2020-10-26 DIAGNOSIS — F17200 Nicotine dependence, unspecified, uncomplicated: Secondary | ICD-10-CM | POA: Diagnosis not present

## 2020-10-26 DIAGNOSIS — G4700 Insomnia, unspecified: Secondary | ICD-10-CM | POA: Diagnosis not present

## 2020-10-26 DIAGNOSIS — R69 Illness, unspecified: Secondary | ICD-10-CM | POA: Diagnosis not present

## 2020-10-26 MED ORDER — ALPRAZOLAM 1 MG PO TABS: 1.0000 mg | ORAL_TABLET | Freq: Three times a day (TID) | ORAL | 2 refills | Status: DC | PRN

## 2020-10-26 NOTE — Progress Notes (Signed)
[facility] Med Check  Patient ID: [name],  MRN: [id number]  PCP: Lujean Amel, MD  Date of Evaluation: 10/26/2020 Time spent:30 minutes  Chief Complaint:  Chief Complaint   Anxiety; Depression; Insomnia; Follow-up     HISTORY/CURRENT STATUS: HPI for routine med check.  States she is doing well except for anxiety.  She is sleeping much better now.  She gets panicky at times thinking about her physical health.  Also continues to have generalized anxiety, feeling like something bad is going to happen.  The Xanax still helps, adding the hydroxyzine is beneficial as well.  Xanax is used more so at night because she needs it in the late afternoon and before bed.  Then she worries about not having enough Xanax if she does have to take 1 during the day. She has not had any acute problems lately and has not been in the hospital since our last visit.  Insomnia is not much of a problem right now the Xanax at night does help.  Able to enjoy things.  Energy and motivation are still low but partly due to her physical problems.  She does not isolate.  She continues to smoke and states she is not ready to quit.  Appetite is normal and weight is stable.  She does not cry easily.  Does not want to stay in bed all the time.  Denies suicidal or homicidal thoughts.  Patient denies increased energy with decreased need for sleep, no increased talkativeness, no racing thoughts, no impulsivity or risky behaviors, no increased spending, no increased libido, no grandiosity, no increased irritability or anger, and no hallucinations.  Denies dizziness, syncope, seizures, numbness, tingling, tremor, tics, unsteady gait, slurred speech, confusion. Denies muscle or joint pain, stiffness, or dystonia.  Individual Medical History/ Review of Systems: Changes? :No   Allergies: Penicillins  Current Medications:  Current Outpatient Medications:    acetaminophen (TYLENOL) 325 MG tablet, Take 2 tablets (650 mg  total) by mouth every 6 (six) hours as needed for mild pain (or Fever >/= 101)., Disp: , Rfl:    cyclobenzaprine (FLEXERIL) 10 MG tablet, Take 10 mg by mouth at bedtime as needed for muscle spasms., Disp: , Rfl:    feeding supplement (ENSURE ENLIVE / ENSURE PLUS) LIQD, Take 237 mLs by mouth 3 (three) times daily between meals., Disp: 237 mL, Rfl: 12   hydrOXYzine (ATARAX/VISTARIL) 50 MG tablet, TAKE 1-2 TABLETS (50-100 MG TOTAL) BY MOUTH AT BEDTIME AS NEEDED., Disp: 180 tablet, Rfl: 1   risperiDONE (RISPERDAL) 3 MG tablet, TAKE 1 TABLET BY MOUTH AT BEDTIME. (Patient taking differently: Take 3 mg by mouth at bedtime.), Disp: 90 tablet, Rfl: 1   vitamin B-12 1000 MCG tablet, Take 1 tablet (1,000 mcg total) by mouth daily., Disp: , Rfl:    ALPRAZolam (XANAX) 1 MG tablet, Take 1 tablet (1 mg total) by mouth 3 (three) times daily as needed for anxiety., Disp: 90 tablet, Rfl: 2   Multiple Vitamin (MULTIVITAMIN WITH MINERALS) TABS tablet, Take 1 tablet by mouth daily. (Patient not taking: No sig reported), Disp: , Rfl:    Nutritional Supplements (,FEEDING SUPPLEMENT, PROSOURCE PLUS) liquid, Take 30 mLs by mouth 2 (two) times daily between meals. (Patient not taking: Reported on 10/26/2020), Disp: , Rfl:    QUEtiapine (SEROQUEL) 25 MG tablet, TAKE 1 TABLET BY MOUTH EVERYDAY AT BEDTIME (Patient not taking: Reported on 10/26/2020), Disp: 90 tablet, Rfl: 0 Medication Side Effects: none  Family Medical/ Social History: Changes? No  MENTAL  HEALTH EXAM:  There were no vitals taken for this visit.There is no height or weight on file to calculate BMI.  General Appearance: Casual, Guarded, and cachectic  Eye Contact:  Good  Speech:  Clear and Coherent and Normal Rate  Volume:  Normal  Mood:  Anxious  Affect:  Blunt and Anxious  Thought Process:  Goal Directed and Descriptions of Associations: Circumstantial  Orientation:  Full (Time, Place, and Person)  Thought Content: Obsessions and Rumination   Suicidal  Thoughts:  No  Homicidal Thoughts:  No  Memory:   Fair  Judgement:  Fair  Insight:  Fair  Psychomotor Activity:  Normal  Concentration:  Concentration: Fair  Recall:  [employer] of Knowledge: Fair  Language: Fair  Assets:  Desire for Improvement  ADL's:  Intact  Cognition: WNL  Prognosis:  Fair    DIAGNOSES:    ICD-10-CM   1. Episodic mood disorder ([place])  F39     2. Generalized anxiety disorder  F41.1     3. Insomnia, unspecified type  G47.00     4. Smoker  F17.200     5. Polypharmacy  Z79.899       Receiving Psychotherapy: No    RECOMMENDATIONS:  PDMP was reviewed. Last Xanax 09/23/20 also on oxycodone per pain management, Dr. Nelva Bush. I provided 30 minutes of face to face time during this encounter, including time spent before and after the visit in records review, counseling of treatment options, medical decision making, and charting.  She understands the increased risk of falls with benzodiazepines.  Plus the fact that she is on an opiate for chronic pain as well.  At this point her anxiety is severe and she states she is willing to take the risk of falling if that is what happens from a benzodiazepine rather than have the severe anxiety she has right now.  Therefore I will increase the Xanax back up to 1 mg 3 times daily as needed. Smoking cessation discussed. Increase Xanax 1 mg, 1 p.o. 3 times daily as needed. Continue hydroxyzine 50 mg, 1-2 p.o. nightly as needed sleep. Continue Risperdal 3 mg, 1 p.o. nightly. Continue multivitamin, B12, recommend vitamin D and fish oil. Return in 3 months.  Donnal Moat, PA-C

## 2020-10-28 ENCOUNTER — Other Ambulatory Visit: Payer: Self-pay | Admitting: Physician Assistant

## 2020-11-25 ENCOUNTER — Other Ambulatory Visit: Payer: Self-pay | Admitting: Physician Assistant

## 2020-12-05 ENCOUNTER — Other Ambulatory Visit: Payer: Self-pay | Admitting: Physician Assistant

## 2020-12-05 ENCOUNTER — Telehealth: Payer: Self-pay | Admitting: Physician Assistant

## 2020-12-05 ENCOUNTER — Other Ambulatory Visit: Payer: Self-pay

## 2020-12-05 MED ORDER — QUETIAPINE FUMARATE 25 MG PO TABS: ORAL_TABLET | ORAL | 0 refills | Status: DC

## 2020-12-05 NOTE — Telephone Encounter (Signed)
Please review.Should she still be taking?I am overlooking the note that stated she was not taking it,but its not on her med list either.

## 2020-12-05 NOTE — Telephone Encounter (Signed)
Pt called to say she needs a RF on her Seroquel 25mg . I noted with her that she reported she wasn't taking it as of 10/26/20 however she states that she has been taking 25mg  1 nightly consistently. She has never stopped it. It helps with sleep. If Ok, [id number] [place] [place] [address], [place] [address]

## 2020-12-05 NOTE — Telephone Encounter (Signed)
She was supposed to have stopped the Seroquel on 10/19/2020 because of "shaking" but apparently she continued to take it and has not had problems.  Refill was sent.

## 2020-12-12 ENCOUNTER — Other Ambulatory Visit: Payer: Self-pay

## 2020-12-12 ENCOUNTER — Other Ambulatory Visit: Payer: Self-pay | Admitting: *Deleted

## 2020-12-12 NOTE — Patient Outreach (Signed)
[facility]) Care Management  12/12/2020  JI FAIRBURN 03-May-1955 [id number]   [facility] Telephone Assessment/Screen for [name] MA HTN referral   Mrs LAURANNE BEYERSDORF was referred to [facility] on 09/01/20 by [name] MA Reason for Referral: 3 admits, 2 ED visits, Depression; RN CC  Insurance: [place] access   Last [place] to admissions- 04/16/20 for cystitis, 02/21/20 pneumonia (PNA)  12/17/19 PNA [place] 03/22/20 muscle weakness of lower extremity, fatigue after discharged from rehab    Ms Pillard is able to verify HIPAA identifiers  Assessment Ms Balinski reports she is doing very well  Hypertension (HTN) She denies elevations in BP, chest pains  Behavioral health  She continues to be followed by her behavior health team and denies any concerns today  Preventive care  She reports she has not scheduled her flu shot at this time She was encouraged to do so and to follow covid precautions She reports she only needs reading glasses Dentist has been seen this year  Smoke cessation She confirms she continues to smoke and is not ready to stop smoking  Patient Active Problem List   Diagnosis Date Noted   Degeneration of lumbar intervertebral disc 08/16/2020   Sepsis secondary to UTI ([place]) 04/17/2020   COVID-19 virus infection 04/17/2020   Hypoglycemia without diagnosis of diabetes mellitus 04/17/2020   Sepsis due to pneumonia ([place]) 04/17/2020   Physical deconditioning    Myositis 03/03/2020   HCAP (healthcare-associated pneumonia) 02/21/2020   Cough 02/21/2020   Generalized weakness 02/21/2020   GAD (generalized anxiety disorder) 02/21/2020   Proximal muscle weakness 02/08/2020   Dysphagia 02/08/2020   Gait disturbance 01/04/2020   Elevated CK 01/04/2020   Weakness of both lower extremities 01/04/2020   Encephalopathy    Polypharmacy    CAP (community acquired pneumonia) 12/18/2019   Fall 12/18/2019   Hypokalemia    Abnormal liver function test     Cervical post-laminectomy syndrome 10/21/2019   Cervical spondylosis 09/23/2019   Bilateral elbow joint pain 08/26/2019   Memory loss 07/02/2019   Depression with anxiety 07/02/2019   B12 deficiency 07/02/2019   Lumbar radiculopathy 02/10/2019   History of fusion of lumbar spine 02/10/2019   Dysesthesia 02/10/2019   Retroperitoneal fibrosis    Pelvic adhesions    Left ovarian cyst 11/17/2018   Elevated tumor markers 11/17/2018   Dyspnea 09/15/2018   Chest pain of uncertain etiology 32/95/1884   Laryngopharyngeal reflux (LPR) 05/19/2018   Bipolar disorder ([place]) 04/16/2018   Insomnia 04/16/2018   Attention deficit hyperactivity disorder (ADHD) 04/16/2018   Neck pain on right side 09/09/2014   Pseudoarthrosis of lumbar spine 04/15/2014   Family history of arrhythmogenic right ventricular cardiomyopathy 03/30/2013   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 02/20/2013   Sinus pause 02/14/2013   Hx of syncope- s/p Loop recorder 11/06/12 10/29/2012   Vertigo 10/29/2012   Tobacco abuse 10/29/2012   ANXIETY 11/14/2009   CERUMEN IMPACTION, RIGHT 11/14/2009   BENIGN POSITIONAL VERTIGO 11/14/2009   EAR PAIN, RIGHT 11/14/2009   SCIATICA 05/09/2009   Current Outpatient Medications on File Prior to Visit  Medication Sig Dispense Refill   acetaminophen (TYLENOL) 325 MG tablet Take 2 tablets (650 mg total) by mouth every 6 (six) hours as needed for mild pain (or Fever >/= 101).     ALPRAZolam (XANAX) 1 MG tablet Take 1 tablet (1 mg total) by mouth 3 (three) times daily as needed for anxiety. 90 tablet 2   cyclobenzaprine (  FLEXERIL) 10 MG tablet Take 10 mg by mouth at bedtime as needed for muscle spasms.     feeding supplement (ENSURE ENLIVE / ENSURE PLUS) LIQD Take 237 mLs by mouth 3 (three) times daily between meals. 237 mL 12   hydrOXYzine (ATARAX/VISTARIL) 50 MG tablet TAKE 1-2 TABLETS (50-100 MG TOTAL) BY MOUTH AT BEDTIME AS NEEDED. 180 tablet 1   Multiple Vitamin (MULTIVITAMIN  WITH MINERALS) TABS tablet Take 1 tablet by mouth daily. (Patient not taking: No sig reported)     Nutritional Supplements (,FEEDING SUPPLEMENT, PROSOURCE PLUS) liquid Take 30 mLs by mouth 2 (two) times daily between meals. (Patient not taking: Reported on 10/26/2020)     QUEtiapine (SEROQUEL) 25 MG tablet TAKE 1 TABLET BY MOUTH EVERYDAY AT BEDTIME 90 tablet 0   risperiDONE (RISPERDAL) 3 MG tablet TAKE 1 TABLET BY MOUTH AT BEDTIME. (Patient taking differently: Take 3 mg by mouth at bedtime.) 90 tablet 1   vitamin B-12 1000 MCG tablet Take 1 tablet (1,000 mcg total) by mouth daily.     No current facility-administered medications on file prior to visit.     Plans Patient agrees to care plan and follow up within the next 30 business days  Goals Addressed               This Visit's Progress     Patient Stated     [facility]) Track and Manage My Blood Pressure-Hypertension (pt-stated)   On track     Timeframe:  Long-Range Goal Priority:  Low Start Date:          09/16/20                   Expected End Date:        01/19/21               Goal completed 01/09/21    - choose a place to take my blood pressure (home, clinic or office, retail store)  -check Blood pressure (frequency) as ordered    Notes: 12/12/20 Pt reports BP less than 140/90 denies hypotensive episodes also She reports she still has not received [facility] welcome package nor EMMIs after resent  10/11/20 received BP cuff Denies hypotensive episodes since last outreach Pending receiving welcome package and EMMIs per pt. EMMIs re emailed after verified e-mail address  09/16/20 not checking BP at home, before taking medicines, reports no BP monitor, agrees to allow [facility] RN CM to request she be sent a BP monitor Voiced understanding of hyper and hypotension symptoms and safety/home interventions        Anastyn Ayars L. Lavina Hamman, RN, BSN, [place] Coordinator Office number [phone] Main [facility] number  [phone] Fax number [phone]

## 2020-12-19 ENCOUNTER — Telehealth: Payer: Self-pay | Admitting: Neurology

## 2020-12-19 NOTE — Telephone Encounter (Signed)
If patient cannot walk, she needs to go to ED for immediate evaluation and treatment. She can follow up after with [place]. However, I see Dr. Felecia Shelling offered to refer her for second opinion to [facility] or [facility])/movement specialist in the past as well. Did they want to do this? They did not reply to Dr. Garth Bigness last mychart message.

## 2020-12-19 NOTE — Telephone Encounter (Signed)
Pt's daughter [name] called states her mother cannot walk. Wanting her mother to be seen as soon as possible. Any appt suggestions, I will call the pt to reschedule.

## 2020-12-23 ENCOUNTER — Other Ambulatory Visit: Payer: Self-pay | Admitting: Physician Assistant

## 2021-01-09 ENCOUNTER — Other Ambulatory Visit: Payer: Self-pay | Admitting: *Deleted

## 2021-01-09 NOTE — Patient Outreach (Signed)
[facility]) Care Management  01/09/2021  STESHA NEYENS [birth year] [id number]   12/12/2020   MACKENZY GRUMBINE June 16, 1955 [id number]     [facility] Telephone Assessment/Screen for [name] MA HTN referral   Mrs DARREL BARONI was referred to [facility] on 09/01/20 by [name] MA Reason for Referral: 3 admits, 2 ED visits, Depression; RN CC  Insurance: [place] access   Last [place] to admissions- 04/16/20 for cystitis, 02/21/20 pneumonia (PNA)  12/17/19 PNA [place] 03/22/20 muscle weakness of lower extremity, fatigue after discharged from rehab     Ms Lewis is able to verify HIPAA identifiers   Assessment Ms Needham reports she is doing very well  She spoke briefly with RN CM today and denies HTN concerns or worsening issues She is eating and requests to reschedule her appointment to 01/19/21 at 1200     Plans Patient agrees to care plan and follow up within the next 7-10 business days  Pranav Lince L. Lavina Hamman, RN, BSN, [place] Coordinator Office number [phone] Main [facility] number [phone] Fax number [phone]

## 2021-01-13 ENCOUNTER — Other Ambulatory Visit: Payer: Self-pay | Admitting: Physician Assistant

## 2021-01-13 DIAGNOSIS — G4700 Insomnia, unspecified: Secondary | ICD-10-CM

## 2021-01-13 DIAGNOSIS — G4709 Other insomnia: Secondary | ICD-10-CM

## 2021-01-13 DIAGNOSIS — F411 Generalized anxiety disorder: Secondary | ICD-10-CM

## 2021-01-19 ENCOUNTER — Other Ambulatory Visit: Payer: Self-pay | Admitting: *Deleted

## 2021-01-19 NOTE — Patient Outreach (Signed)
[place] [facility]) Care Management  01/19/2021  [name] 19-Aug-1955 [id number]   [facility] Unsuccessful outreach for [name] MA HTN referral   Mrs [name] was referred to [name] on 09/01/20 by [name] MA Reason for Referral: 3 admits, 2 ED visits, Depression; RN CC  Insurance: [place] access   Last [place] to admissions- 04/16/20 for cystitis, 02/21/20 pneumonia (PNA)  12/17/19 PNA [place] 03/22/20 muscle weakness of lower extremity, fatigue after discharged from rehab      Outreach attempt to listed at the preferred outreach number in EPIC No answer. [facility] RN CM left HIPAA [facility] Portability and Accountability Act) compliant voicemail message along with CM's contact info.   Plan: [facility] RN CM scheduled this patient for another call attempt within 4-7 business days Unsuccessful outreach on 01/19/21   Summer Parthasarathy L. Lavina Hamman, RN, BSN, [place] Coordinator Office number [phone] Mobile number [phone]  Main [facility] number [phone] Fax number [phone]

## 2021-01-23 ENCOUNTER — Telehealth: Payer: Self-pay | Admitting: Physician Assistant

## 2021-01-23 ENCOUNTER — Other Ambulatory Visit: Payer: Self-pay | Admitting: Physician Assistant

## 2021-01-23 ENCOUNTER — Other Ambulatory Visit: Payer: Self-pay

## 2021-01-23 MED ORDER — ALPRAZOLAM 1 MG PO TABS: 1.0000 mg | ORAL_TABLET | Freq: Three times a day (TID) | ORAL | 0 refills | Status: DC | PRN

## 2021-01-23 NOTE — Telephone Encounter (Signed)
Next visit is 01/26/21. Requesting a refill on her Xanax 1 mg called to:  [id number] [address], [place]  Phone:  [phone]  Fax:  [phone]

## 2021-01-23 NOTE — Telephone Encounter (Signed)
Pended.

## 2021-01-25 ENCOUNTER — Other Ambulatory Visit: Payer: Self-pay | Admitting: *Deleted

## 2021-01-25 NOTE — Patient Outreach (Signed)
[facility]) Care Management  01/25/2021  ILIANA HUTT Oct 05, 1955 [id number]   [facility] second Unsuccessful outreach for [place] MA HTN referral   Mrs AVRY MONTELEONE was referred to [facility] on 09/01/20 by [name] MA Reason for Referral: 3 admits, 2 ED visits, Depression; RN CC  Insurance: [place] access   Last [place] to admissions- 04/16/20 for cystitis, 02/21/20 pneumonia (PNA)  12/17/19 PNA [place] 03/22/20 muscle weakness of lower extremity, fatigue after discharged from rehab         Outreach attempt to listed at the preferred outreach number in EPIC No answer. [facility] RN CM left HIPAA [facility] Portability and Accountability Act) compliant voicemail message along with CM's contact info.    Plan: [facility] RN CM scheduled this patient for another call attempt within 4-7 business days Unsuccessful outreach on 01/19/21, 01/25/21 Unsuccessful outreach letter sent on 01/25/21     Joelene Millin L. Lavina Hamman, RN, BSN, [place] Coordinator Office number [phone] Mobile number [phone]  Main [facility] number [phone] Fax number [phone]

## 2021-01-26 ENCOUNTER — Ambulatory Visit (INDEPENDENT_AMBULATORY_CARE_PROVIDER_SITE_OTHER): Payer: Medicare HMO | Admitting: Physician Assistant

## 2021-01-26 ENCOUNTER — Other Ambulatory Visit: Payer: Self-pay | Admitting: Physician Assistant

## 2021-01-26 ENCOUNTER — Encounter: Payer: Self-pay | Admitting: Physician Assistant

## 2021-01-26 DIAGNOSIS — F411 Generalized anxiety disorder: Secondary | ICD-10-CM

## 2021-01-26 DIAGNOSIS — G4700 Insomnia, unspecified: Secondary | ICD-10-CM

## 2021-01-26 DIAGNOSIS — F39 Unspecified mood [affective] disorder: Secondary | ICD-10-CM

## 2021-01-26 DIAGNOSIS — G4709 Other insomnia: Secondary | ICD-10-CM

## 2021-01-26 DIAGNOSIS — R69 Illness, unspecified: Secondary | ICD-10-CM | POA: Diagnosis not present

## 2021-01-26 MED ORDER — HYDROXYZINE HCL 50 MG PO TABS: 50.0000 mg | ORAL_TABLET | Freq: Every evening | ORAL | 3 refills | Status: DC | PRN

## 2021-01-26 MED ORDER — ALPRAZOLAM 1 MG PO TABS: 1.0000 mg | ORAL_TABLET | Freq: Three times a day (TID) | ORAL | 3 refills | Status: DC | PRN

## 2021-01-26 MED ORDER — RISPERIDONE 3 MG PO TABS: 3.0000 mg | ORAL_TABLET | Freq: Every day | ORAL | 3 refills | Status: DC

## 2021-01-26 MED ORDER — QUETIAPINE FUMARATE 25 MG PO TABS: 25.0000 mg | ORAL_TABLET | Freq: Every day | ORAL | 3 refills | Status: DC

## 2021-01-26 NOTE — Progress Notes (Signed)
[facility] Med Check  Patient ID: MAHAM QUINTIN,  MRN: [id number]  PCP: Lujean Amel, MD  Date of Evaluation: 01/26/2021 Time spent:25 minutes  Chief Complaint:  Chief Complaint   Anxiety; Insomnia; Follow-up    Virtual Visit via Telehealth  I connected with patient by telephone, with their informed consent, and verified patient privacy and that I am speaking with the correct person using two identifiers.  I am private, in my office and the patient is at home.  I discussed the limitations, risks, security and privacy concerns of performing an evaluation and management service by telephone and the availability of in person appointments. I also discussed with the patient that there may be a patient responsible charge related to this service. The patient expressed understanding and agreed to proceed.   I discussed the assessment and treatment plan with the patient. The patient was provided an opportunity to ask questions and all were answered. The patient agreed with the plan and demonstrated an understanding of the instructions.   The patient was advised to call back or seek an in-person evaluation if the symptoms worsen or if the condition fails to improve as anticipated.  I provided 25 minutes of non-face-to-face time during this encounter.   HISTORY/CURRENT STATUS: HPI for routine med check.  Anxiety is about the same. She gets panicky at times if she does not take the Xanax.  Does not have panic attacks when she takes the Xanax pretty routinely.  She has generalized anxiety, a sense that something bad is going to happen if she does not take the Xanax regularly.  Sometimes she will use the hydroxyzine but it does not help as much.   She is sleeping much better now.  The Seroquel and hydroxyzine are really helpful, reports no dreams or nightmares.  She feels rested when she gets up.  Able to enjoy things.  Energy and motivation are still low but partly due to her physical  problems.  She isolates some but it is because of her circumstances.  She is not able to drive right now because she is having problems with her legs.  Her daughter does go check on her every day but other than that she is not having a lot of contact with people.  She continues to smoke and states she is not ready to quit.  Appetite is normal and weight is stable.  She does not cry easily.  Unable to work still.  Denies suicidal or homicidal thoughts.  Patient denies increased energy with decreased need for sleep, no increased talkativeness, no racing thoughts, no impulsivity or risky behaviors, no increased spending, no increased libido, no grandiosity, no increased irritability or anger, no paranoia, and no hallucinations.  Is having problems with her legs, she does not elaborate.  Also "just got over a spell of vertigo" but is doing much better now. Denies dizziness now, syncope, seizures, numbness, tingling, tremor, tics, slurred speech, confusion.  Denies dystonia.  Individual Medical History/ Review of Systems: Changes? :Yes  see above  Past medications for mental health diagnoses include: Trazodone, Belsomra, Risperdal, Zoloft, hydroxyzine, mirtazapine, Xanax, Valium, Wellbutrin, Abilify, gabapentin, Strattera, Vyvanse, Ambien, Lunesta, Dayvigo didn't help, Latuda  Allergies: Antibiotic ear [neomycin-polymyxin-hc] and Penicillins  Current Medications:  Current Outpatient Medications:    acetaminophen (TYLENOL) 325 MG tablet, Take 2 tablets (650 mg total) by mouth every 6 (six) hours as needed for mild pain (or Fever >/= 101)., Disp: , Rfl:    cyclobenzaprine (FLEXERIL) 10 MG tablet, Take  10 mg by mouth at bedtime as needed for muscle spasms., Disp: , Rfl:    feeding supplement (ENSURE ENLIVE / ENSURE PLUS) LIQD, Take 237 mLs by mouth 3 (three) times daily between meals., Disp: 237 mL, Rfl: 12   vitamin B-12 1000 MCG tablet, Take 1 tablet (1,000 mcg total) by mouth daily., Disp: , Rfl:     ALPRAZolam (XANAX) 1 MG tablet, Take 1 tablet (1 mg total) by mouth 3 (three) times daily as needed for anxiety., Disp: 90 tablet, Rfl: 3   hydrOXYzine (ATARAX/VISTARIL) 50 MG tablet, Take 1-2 tablets (50-100 mg total) by mouth at bedtime as needed., Disp: 60 tablet, Rfl: 3   Multiple Vitamin (MULTIVITAMIN WITH MINERALS) TABS tablet, Take 1 tablet by mouth daily. (Patient not taking: No sig reported), Disp: , Rfl:    Nutritional Supplements (,FEEDING SUPPLEMENT, PROSOURCE PLUS) liquid, Take 30 mLs by mouth 2 (two) times daily between meals. (Patient not taking: No sig reported), Disp: , Rfl:    Oxycodone HCl 10 MG TABS, Take 10 mg by mouth 3 (three) times daily as needed., Disp: , Rfl:    QUEtiapine (SEROQUEL) 25 MG tablet, Take 1 tablet (25 mg total) by mouth at bedtime., Disp: 30 tablet, Rfl: 3   risperiDONE (RISPERDAL) 3 MG tablet, Take 1 tablet (3 mg total) by mouth at bedtime., Disp: 30 tablet, Rfl: 3 Medication Side Effects: none  Family Medical/ Social History: Changes? No  MENTAL HEALTH EXAM:  There were no vitals taken for this visit.There is no height or weight on file to calculate BMI.  General Appearance:  Unable to assess  Eye Contact:   Unable to assess  Speech:  Clear and Coherent and Normal Rate  Volume:  Normal  Mood:  Euthymic  Affect:   Unable to assess  Thought Process:  Goal Directed and Descriptions of Associations: Circumstantial  Orientation:  Full (Time, Place, and Person)  Thought Content: Logical   Suicidal Thoughts:  No  Homicidal Thoughts:  No  Memory:   Fair  Judgement:  Fair  Insight:  Fair  Psychomotor Activity:   Unable to assess  Concentration:  Concentration: Good  Recall:  [employer] of Knowledge: Fair  Language: Fair  Assets:  Desire for Improvement  ADL's:  Intact  Cognition: WNL  Prognosis:  Fair    DIAGNOSES:    ICD-10-CM   1. Episodic mood disorder ([place])  F39     2. Other insomnia  G47.09 risperiDONE (RISPERDAL) 3 MG tablet    3.  Generalized anxiety disorder  F41.1 risperiDONE (RISPERDAL) 3 MG tablet    4. Insomnia, unspecified type  G47.00 risperiDONE (RISPERDAL) 3 MG tablet       Receiving Psychotherapy: No    RECOMMENDATIONS:  PDMP was reviewed. Last Xanax filled 01/23/2021.  Oxycodone filled 01/10/2021. I provided 25 minutes of non-face-to-face time during this encounter, including time spent before and after the visit in records review, medical decision making, and charting.  She is stable on current medications so no changes are necessary. We once again discussed the increased risk of confusion, vertigo, falls from any benzodiazepine.  She is well aware of those risks and accepts. Smoking cessation discussed.  States she is not ready to quit. Continue Xanax 1 mg, 1 p.o. 3 times daily as needed, take as little as possible. Continue hydroxyzine 50 mg, 1-2 p.o. nightly as needed sleep. Continue Seroquel 25 mg, 1 p.o. nightly. Continue Risperdal 3 mg, 1 p.o. nightly. Continue multivitamin, B12, recommend vitamin  D and fish oil. Return in 4 months.  Donnal Moat, PA-C

## 2021-02-02 ENCOUNTER — Other Ambulatory Visit: Payer: Self-pay | Admitting: *Deleted

## 2021-02-02 NOTE — Patient Outreach (Signed)
[facility]) Care Management  02/02/2021  [name] 1955-04-27 [id number]   [facility] third Unsuccessful outreach for [place] MA HTN referral   [name] was referred to [facility] on 09/01/20 by [name] MA Reason for Referral: 3 admits, 2 ED visits, Depression; RN CC  Insurance: [place] access   Last [place] to admissions- 04/16/20 for cystitis, 02/21/20 pneumonia (PNA)  12/17/19 PNA  03/22/20 muscle weakness of lower extremity, fatigue after discharged from rehab         Outreach attempt to listed at the preferred outreach number in EPIC No answer. [facility] RN CM left HIPAA [facility] Portability and Accountability Act) compliant voicemail message along with CM's contact info.    Plan: [facility] RN CM scheduled this patient for pended case closure Unsuccessful outreach on 01/19/21, 01/25/21, 02/02/21 Unsuccessful outreach letter sent on 01/25/21     Joelene Millin L. Lavina Hamman, RN, BSN, [place] Coordinator Office number [phone] Mobile number [phone]  Main [facility] number [phone] Fax number [phone]

## 2021-02-16 DIAGNOSIS — M542 Cervicalgia: Secondary | ICD-10-CM | POA: Diagnosis not present

## 2021-02-16 DIAGNOSIS — M25512 Pain in left shoulder: Secondary | ICD-10-CM | POA: Diagnosis not present

## 2021-02-24 ENCOUNTER — Other Ambulatory Visit: Payer: Self-pay | Admitting: *Deleted

## 2021-02-24 NOTE — Patient Outreach (Signed)
[place] [facility]) Care Management  02/24/2021  [name] 1955/05/04 [id number]   [facility] Case closure  Mrs [name] was referred to [facility] on 09/01/20 by [name] MA Reason for Referral: 3 admits, 2 ED visits, Depression; RN CC  Insurance: [place] access   Last [place] to admissions- 04/16/20 for cystitis, 02/21/20 pneumonia (PNA)  12/17/19 PNA [place] 03/22/20 muscle weakness of lower extremity, fatigue after discharged from rehab   Unsuccessful outreach on 01/19/21, 01/25/21, 02/02/21 Unsuccessful outreach letter sent on 01/25/21 without a response   Plan [facility] RN CM will close case after no response from patient within 10 business days. Unable to reach Case closure letters sent to patient and MD   Care Plan : [place] of Care  Updates made by Barbaraann Faster, RN since 02/24/2021 12:00 AM     Problem: Complex Care Coordination Needs and disease management in patient with HTN Resolved 02/24/2021  Priority: High     Long-Range Goal: Establish Plan of Care for Management Complex SDOH Barriers and Care Coordination Needs in patient with Completed 02/24/2021  Start Date: 01/19/2021  This Visit's Progress: Not on track  Priority: High  Note:   Current Barriers:  Knowledge Deficits related to plan of care for management of HTN Care Coordination needs related to Limited education about HTN* 3 admissions, 2 ED visits, depression  RNCM Clinical Goal(s):  Patient will verbalize basic understanding of  HTN disease process and self health management plan , assist with DME needs to monitor at home  through collaboration with RN Care manager, provider, and care team.   Interventions: Continue follow up outreaches as patient  Inter-disciplinary care team collaboration (see longitudinal plan of care) Evaluation of current treatment plan related to  self management and patient's adherence to plan as established by provider  Hypertension  Interventions: Last practice recorded BP readings:  BP Readings from Last 3 Encounters:  04/21/20 134/72  03/25/20 127/85  02/27/20 92/60  Most recent eGFR/CrCl: No results found for: EGFR  No components found for: CRCL  Evaluation of current treatment plan related to hypertension self management and patient's adherence to plan as established by provider; Reviewed medications with patient and discussed importance of compliance; Provided assistance with obtaining home blood pressure monitor via mail;  Discussed plans with patient for ongoing care management follow up and provided patient with direct contact information for care management team; Advised patient, providing education and rationale, to monitor blood pressure daily and record, calling PCP for findings outside established parameters;  Assessed social determinant of health barriers;   Patient Goals/Self-Care Activities: Patient will self administer medications as prescribed Patient will attend all scheduled provider appointments Patient will call provider office for new concerns or questions  Follow Up Plan:  Unable to maintain contact with patient after 3 unsuccessful outreach attempts and an unsuccessful outreach letter was sent  Completion of [facility] case closure with letters sent to patient and pcp     Deretha Ertle L. Lavina Hamman, RN, BSN, [place] Coordinator Office number [phone] Mobile number [phone]  Main [facility] number [phone] Fax number [phone]

## 2021-03-08 ENCOUNTER — Emergency Department (HOSPITAL_COMMUNITY): Payer: Medicare HMO

## 2021-03-08 ENCOUNTER — Inpatient Hospital Stay (HOSPITAL_COMMUNITY)
Admission: EM | Admit: 2021-03-08 | Discharge: 2021-03-12 | DRG: 071 | Disposition: A | Discharge status: Home / Self Care | Payer: Medicare HMO | Attending: Internal Medicine | Admitting: Internal Medicine

## 2021-03-08 ENCOUNTER — Encounter (HOSPITAL_COMMUNITY): Payer: Self-pay

## 2021-03-08 ENCOUNTER — Other Ambulatory Visit: Payer: Self-pay

## 2021-03-08 DIAGNOSIS — I700 Atherosclerosis of aorta: Secondary | ICD-10-CM | POA: Diagnosis present

## 2021-03-08 DIAGNOSIS — E538 Deficiency of other specified B group vitamins: Secondary | ICD-10-CM | POA: Diagnosis present

## 2021-03-08 DIAGNOSIS — G4700 Insomnia, unspecified: Secondary | ICD-10-CM | POA: Diagnosis present

## 2021-03-08 DIAGNOSIS — Z743 Need for continuous supervision: Secondary | ICD-10-CM | POA: Diagnosis not present

## 2021-03-08 DIAGNOSIS — Z681 Body mass index (BMI) 19 or less, adult: Secondary | ICD-10-CM

## 2021-03-08 DIAGNOSIS — Z950 Presence of cardiac pacemaker: Secondary | ICD-10-CM

## 2021-03-08 DIAGNOSIS — Z8249 Family history of ischemic heart disease and other diseases of the circulatory system: Secondary | ICD-10-CM

## 2021-03-08 DIAGNOSIS — F319 Bipolar disorder, unspecified: Secondary | ICD-10-CM | POA: Diagnosis present

## 2021-03-08 DIAGNOSIS — R17 Unspecified jaundice: Secondary | ICD-10-CM | POA: Diagnosis present

## 2021-03-08 DIAGNOSIS — Z7901 Long term (current) use of anticoagulants: Secondary | ICD-10-CM

## 2021-03-08 DIAGNOSIS — Z20822 Contact with and (suspected) exposure to covid-19: Secondary | ICD-10-CM | POA: Diagnosis present

## 2021-03-08 DIAGNOSIS — R000 Tachycardia, unspecified: Secondary | ICD-10-CM | POA: Diagnosis not present

## 2021-03-08 DIAGNOSIS — E876 Hypokalemia: Secondary | ICD-10-CM | POA: Diagnosis present

## 2021-03-08 DIAGNOSIS — G9341 Metabolic encephalopathy: Principal | ICD-10-CM | POA: Diagnosis present

## 2021-03-08 DIAGNOSIS — R64 Cachexia: Secondary | ICD-10-CM | POA: Diagnosis present

## 2021-03-08 DIAGNOSIS — Z880 Allergy status to penicillin: Secondary | ICD-10-CM

## 2021-03-08 DIAGNOSIS — R4182 Altered mental status, unspecified: Secondary | ICD-10-CM

## 2021-03-08 DIAGNOSIS — F419 Anxiety disorder, unspecified: Secondary | ICD-10-CM | POA: Diagnosis present

## 2021-03-08 DIAGNOSIS — R410 Disorientation, unspecified: Secondary | ICD-10-CM | POA: Diagnosis not present

## 2021-03-08 DIAGNOSIS — Z981 Arthrodesis status: Secondary | ICD-10-CM

## 2021-03-08 DIAGNOSIS — F17210 Nicotine dependence, cigarettes, uncomplicated: Secondary | ICD-10-CM | POA: Diagnosis present

## 2021-03-08 DIAGNOSIS — Z90710 Acquired absence of both cervix and uterus: Secondary | ICD-10-CM

## 2021-03-08 DIAGNOSIS — R0902 Hypoxemia: Secondary | ICD-10-CM | POA: Diagnosis not present

## 2021-03-08 DIAGNOSIS — R29700 NIHSS score 0: Secondary | ICD-10-CM | POA: Diagnosis present

## 2021-03-08 DIAGNOSIS — Z8541 Personal history of malignant neoplasm of cervix uteri: Secondary | ICD-10-CM

## 2021-03-08 DIAGNOSIS — Z888 Allergy status to other drugs, medicaments and biological substances status: Secondary | ICD-10-CM

## 2021-03-08 DIAGNOSIS — R636 Underweight: Secondary | ICD-10-CM | POA: Diagnosis present

## 2021-03-08 DIAGNOSIS — Z79899 Other long term (current) drug therapy: Secondary | ICD-10-CM

## 2021-03-08 DIAGNOSIS — G8929 Other chronic pain: Secondary | ICD-10-CM | POA: Diagnosis present

## 2021-03-08 DIAGNOSIS — E872 Acidosis, unspecified: Secondary | ICD-10-CM | POA: Diagnosis present

## 2021-03-08 LAB — ETHANOL: Alcohol, Ethyl (B): <10 mg/dL (ref <10)

## 2021-03-08 LAB — URINALYSIS, ROUTINE W REFLEX MICROSCOPIC
Bilirubin Urine: NEGATIVE
Glucose, UA: NEGATIVE mg/dL
Hgb urine dipstick: NEGATIVE
Ketones, ur: 20 mg/dL — AB
Nitrite: NEGATIVE
Protein, ur: 30 mg/dL — AB
Specific Gravity, Urine: 1.021 (ref 1.005–1.030)
pH: 5 (ref 5.0–8.0)

## 2021-03-08 LAB — COMPREHENSIVE METABOLIC PANEL
ALT: 18 U/L (ref 0–44)
AST: 39 U/L (ref 15–41)
Albumin: 4.8 g/dL (ref 3.5–5.0)
Alkaline Phosphatase: 56 U/L (ref 38–126)
Anion gap: 13 (ref 5–15)
BUN: 12 mg/dL (ref 8–23)
CO2: 25 mmol/L (ref 22–32)
Calcium: 9.2 mg/dL (ref 8.9–10.3)
Chloride: 99 mmol/L (ref 98–111)
Creatinine, Ser: 0.77 mg/dL (ref 0.44–1.00)
GFR, Estimated: >60 mL/min (ref >60)
Glucose, Bld: 105 mg/dL — ABNORMAL HIGH (ref 70–99)
Potassium: 3.2 mmol/L — ABNORMAL LOW (ref 3.5–5.1)
Sodium: 137 mmol/L (ref 135–145)
Total Bilirubin: 2.8 mg/dL — ABNORMAL HIGH (ref 0.3–1.2)
Total Protein: 7.5 g/dL (ref 6.5–8.1)

## 2021-03-08 LAB — BLOOD GAS, VENOUS
Acid-Base Excess: 1.4 mmol/L (ref 0.0–2.0)
Bicarbonate: 26.7 mmol/L (ref 20.0–28.0)
O2 Saturation: 71.7 %
Patient temperature: 98.6
pCO2, Ven: 46.7 mmHg (ref 44.0–60.0)
pH, Ven: 7.375 (ref 7.250–7.430)
pO2, Ven: 40.8 mmHg (ref 32.0–45.0)

## 2021-03-08 LAB — RESP PANEL BY RT-PCR (FLU A&B, COVID) ARPGX2
Influenza A by PCR: NEGATIVE
Influenza B by PCR: NEGATIVE
SARS Coronavirus 2 by RT PCR: NEGATIVE

## 2021-03-08 LAB — CBC WITH DIFFERENTIAL/PLATELET
Abs Immature Granulocytes: 0.03 10*3/uL (ref 0.00–0.07)
Basophils Absolute: 0 10*3/uL (ref 0.0–0.1)
Basophils Relative: 0 %
Eosinophils Absolute: 0 10*3/uL (ref 0.0–0.5)
Eosinophils Relative: 0 %
HCT: 48.4 % — ABNORMAL HIGH (ref 36.0–46.0)
Hemoglobin: 15.9 g/dL — ABNORMAL HIGH (ref 12.0–15.0)
Immature Granulocytes: 0 %
Lymphocytes Relative: 17 %
Lymphs Abs: 1.7 10*3/uL (ref 0.7–4.0)
MCH: 27.3 pg (ref 26.0–34.0)
MCHC: 32.9 g/dL (ref 30.0–36.0)
MCV: 83.2 fL (ref 80.0–100.0)
Monocytes Absolute: 0.6 10*3/uL (ref 0.1–1.0)
Monocytes Relative: 6 %
Neutro Abs: 7.6 10*3/uL (ref 1.7–7.7)
Neutrophils Relative %: 77 %
Platelets: 244 10*3/uL (ref 150–400)
RBC: 5.82 MIL/uL — ABNORMAL HIGH (ref 3.87–5.11)
RDW: 15 % (ref 11.5–15.5)
WBC: 9.9 10*3/uL (ref 4.0–10.5)
nRBC: 0 % (ref 0.0–0.2)

## 2021-03-08 LAB — RAPID URINE DRUG SCREEN, HOSP PERFORMED
Amphetamines: NOT DETECTED
Barbiturates: NOT DETECTED
Benzodiazepines: POSITIVE — AB
Cocaine: NOT DETECTED
Opiates: NOT DETECTED
Tetrahydrocannabinol: NOT DETECTED

## 2021-03-08 LAB — LACTIC ACID, PLASMA
Lactic Acid, Venous: 2.7 mmol/L (ref 0.5–1.9)
Lactic Acid, Venous: 1.2 mmol/L (ref 0.5–1.9)

## 2021-03-08 LAB — TSH: TSH: 0.693 u[IU]/mL (ref 0.350–4.500)

## 2021-03-08 LAB — BILIRUBIN, FRACTIONATED(TOT/DIR/INDIR)
Bilirubin, Direct: 0.3 mg/dL — ABNORMAL HIGH (ref 0.0–0.2)
Indirect Bilirubin: 1.8 mg/dL — ABNORMAL HIGH (ref 0.3–0.9)
Total Bilirubin: 2.1 mg/dL — ABNORMAL HIGH (ref 0.3–1.2)

## 2021-03-08 LAB — MAGNESIUM
Magnesium: 2.1 mg/dL (ref 1.7–2.4)
Magnesium: 2.1 mg/dL (ref 1.7–2.4)

## 2021-03-08 LAB — PROTIME-INR
INR: 1 (ref 0.8–1.2)
Prothrombin Time: 13.1 seconds (ref 11.4–15.2)

## 2021-03-08 LAB — ACETAMINOPHEN LEVEL: Acetaminophen (Tylenol), Serum: <10 ug/mL — ABNORMAL LOW (ref 10–30)

## 2021-03-08 LAB — PHOSPHORUS: Phosphorus: 3.8 mg/dL (ref 2.5–4.6)

## 2021-03-08 LAB — AMMONIA: Ammonia: 15 umol/L (ref 9–35)

## 2021-03-08 LAB — TROPONIN I (HIGH SENSITIVITY)
Troponin I (High Sensitivity): 10 ng/L (ref <18)
Troponin I (High Sensitivity): 9 ng/L (ref <18)

## 2021-03-08 LAB — SALICYLATE LEVEL: Salicylate Lvl: <7 mg/dL — ABNORMAL LOW (ref 7.0–30.0)

## 2021-03-08 LAB — LIPASE, BLOOD: Lipase: 29 U/L (ref 11–51)

## 2021-03-08 MED ORDER — ONDANSETRON HCL 4 MG PO TABS: 4.0000 mg | ORAL_TABLET | Freq: Four times a day (QID) | ORAL | Status: DC | PRN

## 2021-03-08 MED ORDER — ACETAMINOPHEN 650 MG RE SUPP: 650.0000 mg | Freq: Four times a day (QID) | RECTAL | Status: DC | PRN

## 2021-03-08 MED ORDER — SODIUM CHLORIDE 0.9 % IV SOLN: Freq: Once | INTRAVENOUS | Status: AC

## 2021-03-08 MED ORDER — ONDANSETRON HCL 4 MG/2ML IJ SOLN: 4.0000 mg | Freq: Four times a day (QID) | INTRAMUSCULAR | Status: DC | PRN

## 2021-03-08 MED ORDER — SODIUM CHLORIDE 0.9 % IV SOLN
1.0000 g | INTRAVENOUS | Status: DC
  Administered 2021-03-08 – 2021-03-09 (×2): 1 g via INTRAVENOUS
  Filled 2021-03-08 (×3): qty 10

## 2021-03-08 MED ORDER — ACETAMINOPHEN 325 MG PO TABS
650.0000 mg | ORAL_TABLET | Freq: Four times a day (QID) | ORAL | Status: DC | PRN
  Administered 2021-03-10: 11:00:00 650 mg via ORAL
  Filled 2021-03-08: qty 2

## 2021-03-08 MED ORDER — POTASSIUM CHLORIDE CRYS ER 20 MEQ PO TBCR
40.0000 meq | EXTENDED_RELEASE_TABLET | Freq: Every day | ORAL | Status: AC
  Administered 2021-03-08 – 2021-03-09 (×2): 40 meq via ORAL
  Filled 2021-03-08 (×2): qty 2

## 2021-03-08 NOTE — H&P (Signed)
History and Physical    [name]:[id number] DOB: 04-Jan-1956 DOA: 03/08/2021  PCP: Lujean Amel, MD  Patient coming from: Home  Chief Complaint: altered mental status  HPI: [name] is a 65 y.o. female with medical history significant of anxiety, depression, chronic pain. Presenting with altered mental status. History mostly from daughter. Her daughter reports that the patient hasn't been in contact for the last 2 days. She was concerned, so she checked on the patient this morning. She found the patient on the floor and confused. She became concerned and called for EMS. Prior to 2 days ago, her mother was in her normal state of health per the daughter's recollection. The patient reports that she's had some dysuria over the last several days, but otherwise is unaware why she is in the hospital. She denies any fall or head injury. She denies any other aggravating or alleviating factors.    ED Course: Lactic acid was elevated. She was given fluids. CTH was negative. UA was dirty. TRH was called for admission.   Review of Systems:  Review of systems is otherwise negative for all not mentioned in HPI.   PMHx Past Medical History:  Diagnosis Date   Anxiety    anxiety and mild depressive order systoms   Bipolar disorder ([place])     (02/20/2013)   Cervical cancer ([place]) 03/19/1980   Depression    Dysrhythmia    Headache(784.0)    "monthly" (02/20/2013)   Pacemaker    medtronic   PONV (postoperative nausea and vomiting)    Sinus pause 02/14/2013   Syncope and collapse    echo-April 12,2012-EF 55% Echo normal; recorder with prolonged sinus pauses --> status post   Tobacco abuse    Vertigo     PSHx Past Surgical History:  Procedure Laterality Date   ABDOMINAL HYSTERECTOMY  03/19/1980   "partial"   BACK SURGERY     CERVICAL FUSION Left 11/2013   C4-C6   CERVICAL FUSION     3,4,5,   INSERT / REPLACE / REMOVE PACEMAKER  02/20/2013   medtronic   LOOP RECORDER IMPLANT N/A  11/06/2012   Procedure: LINQ LOOP RECORDER IMPLANT;  Surgeon: Sanda Klein, MD;  Location: [facility];  Service: Cardiovascular;  Laterality: N/A;   NECK SURGERY  11/17/2013   PACEMAKER PLACEMENT N/A 02/16/2013   PERMANENT PACEMAKER INSERTION N/A 02/20/2013   Procedure: PERMANENT PACEMAKER INSERTION;  Surgeon: Sanda Klein, MD;  Location: [facility];  Service: Cardiovascular;  Laterality: N/A;   POSTERIOR LUMBAR FUSION  ~ 2009   ROBOTIC ASSISTED SALPINGO OOPHERECTOMY Bilateral 11/25/2018   Procedure: XI ROBOTIC ASSISTED BILATERAL SALPINGO OOPHORECTOMY and LYSIS OF ADHESIONS.;  Surgeon: Everitt Amber, MD;  Location: [facility];  Service: Gynecology;  Laterality: Bilateral;   SPINAL FUSION  03/19/2014   TONSILLECTOMY     TRANSTHORACIC ECHOCARDIOGRAM  07/11/2010   The left atrial size is normal.There is no evidence of mitral vavle prolaspe.Right ventriicular systolic pressure is normal . Injection of contrast documented no interatrial shunt. Essentially normal 2D echo -doppler study     SocHx  reports that she has been smoking cigarettes. She has a 30.00 pack-year smoking history. She has never used smokeless tobacco. She reports that she does not drink alcohol and does not use drugs.  Allergies  Allergen Reactions   Antibiotic Ear [Neomycin-Polymyxin-Hc]     Other reaction(s): Unknown   Penicillins Other (See Comments)    Yeast infection Did it involve swelling of the face/tongue/throat, SOB,  or low BP? No Did it involve sudden or severe rash/hives, skin peeling, or any reaction on the inside of your mouth or nose? No Did you need to seek medical attention at a hospital or doctor's office? Yes When did it last happen? More than 5 years ago  If all above answers are "NO", may proceed with cephalosporin use.     FamHx Family History  Adopted: Yes  Problem Relation Age of Onset   Heart attack Brother     Prior to Admission medications   Medication Sig Start Date End Date Taking?  Authorizing Provider  acetaminophen (TYLENOL) 325 MG tablet Take 2 tablets (650 mg total) by mouth every 6 (six) hours as needed for mild pain (or Fever >/= 101). 02/26/20   Domenic Polite, MD  ALPRAZolam Duanne Moron) 1 MG tablet Take 1 tablet (1 mg total) by mouth 3 (three) times daily as needed for anxiety. 01/26/21   Donnal Moat T, PA-C  [facility] B-12 500 MCG tablet Take 500 mcg by mouth daily. 02/13/21   [provider]  cyclobenzaprine (FLEXERIL) 10 MG tablet Take 10 mg by mouth at bedtime as needed for muscle spasms. 12/30/19   [provider]  feeding supplement (ENSURE ENLIVE / ENSURE PLUS) LIQD Take 237 mLs by mouth 3 (three) times daily between meals. 04/21/20   Ghimire, Henreitta Leber, MD  hydrOXYzine (ATARAX/VISTARIL) 50 MG tablet Take 1-2 tablets (50-100 mg total) by mouth at bedtime as needed. 01/26/21   Addison Lank, PA-C  Multiple Vitamin (MULTIVITAMIN WITH MINERALS) TABS tablet Take 1 tablet by mouth daily. Patient not taking: No sig reported 04/22/20   Jonetta Osgood, MD  Nutritional Supplements (,FEEDING SUPPLEMENT, PROSOURCE PLUS) liquid Take 30 mLs by mouth 2 (two) times daily between meals. Patient not taking: No sig reported 04/21/20   Jonetta Osgood, MD  Oxycodone HCl 10 MG TABS Take 10 mg by mouth 3 (three) times daily as needed (pain). 01/10/21   [provider]  QUEtiapine (SEROQUEL) 25 MG tablet Take 1 tablet (25 mg total) by mouth at bedtime. 01/26/21   Donnal Moat T, PA-C  risperiDONE (RISPERDAL) 3 MG tablet Take 1 tablet (3 mg total) by mouth at bedtime. 01/26/21   Donnal Moat T, PA-C  tiZANidine (ZANAFLEX) 4 MG tablet Take 4 mg by mouth 3 (three) times daily as needed for pain. 01/04/21   [provider]  vitamin B-12 1000 MCG tablet Take 1 tablet (1,000 mcg total) by mouth daily. 02/27/20   Domenic Polite, MD    Physical Exam: Vitals:   03/08/21 1515 03/08/21 1530 03/08/21 1545 03/08/21 1600  BP: (!) 139/92 133/81 135/89 134/80   Pulse: 63 65 65 65  Resp: 14 16 16 13   Temp:      TempSrc:      SpO2: 100% 100% 100% 100%  Weight:      Height:        General: 65 y.o. female resting in bed in NAD Eyes: PERRL, normal sclera ENMT: Nares patent w/o discharge, orophaynx clear, dentition normal, ears w/o discharge/lesions/ulcers Neck: Supple, trachea midline Cardiovascular: RRR, +S1, S2, no m/g/r, equal pulses throughout Respiratory: CTABL, no w/r/r, normal WOB GI: BS+, NDNT, no masses noted, no organomegaly noted MSK: No e/c/c Neuro: A&O x 2 (name, place), no focal deficits Psyc: pleasantly confused, but calm/cooperative  Labs on Admission: I have personally reviewed following labs and imaging studies  CBC: Recent Labs  Lab 03/08/21 1308  WBC 9.9  NEUTROABS 7.6  HGB 15.9*  HCT 48.4*  MCV 83.2  PLT 657   Basic Metabolic Panel: Recent Labs  Lab 03/08/21 1308  NA 137  K 3.2*  CL 99  CO2 25  GLUCOSE 105*  BUN 12  CREATININE 0.77  CALCIUM 9.2  MG 2.1  PHOS 3.8   GFR: Estimated Creatinine Clearance: 61.8 mL/min (by C-G formula based on SCr of 0.77 mg/dL). Liver Function Tests: Recent Labs  Lab 03/08/21 1308  AST 39  ALT 18  ALKPHOS 56  BILITOT 2.8*  PROT 7.5  ALBUMIN 4.8   Recent Labs  Lab 03/08/21 1308  LIPASE 29   Recent Labs  Lab 03/08/21 1309  AMMONIA 15   Coagulation Profile: Recent Labs  Lab 03/08/21 1308  INR 1.0   Cardiac Enzymes: No results for input(s): CKTOTAL, CKMB, CKMBINDEX, TROPONINI in the last 168 hours. BNP (last 3 results) No results for input(s): PROBNP in the last 8760 hours. HbA1C: No results for input(s): HGBA1C in the last 72 hours. CBG: No results for input(s): GLUCAP in the last 168 hours. Lipid Profile: No results for input(s): CHOL, HDL, LDLCALC, TRIG, CHOLHDL, LDLDIRECT in the last 72 hours. Thyroid Function Tests: Recent Labs    03/08/21 1312  TSH 0.693   Anemia Panel: No results for input(s): VITAMINB12, FOLATE, FERRITIN, TIBC,  IRON, RETICCTPCT in the last 72 hours. Urine analysis:    Component Value Date/Time   COLORURINE AMBER (A) 03/08/2021 1421   APPEARANCEUR HAZY (A) 03/08/2021 1421   LABSPEC 1.021 03/08/2021 1421   PHURINE 5.0 03/08/2021 1421   GLUCOSEU NEGATIVE 03/08/2021 1421   HGBUR NEGATIVE 03/08/2021 1421   BILIRUBINUR NEGATIVE 03/08/2021 1421   KETONESUR 20 (A) 03/08/2021 1421   PROTEINUR 30 (A) 03/08/2021 1421   NITRITE NEGATIVE 03/08/2021 1421   LEUKOCYTESUR TRACE (A) 03/08/2021 1421    Radiological Exams on Admission: CT Head Wo Contrast  Result Date: 03/08/2021 CLINICAL DATA:  Mental status change, unknown cause. Found down. Confusion. EXAM: CT HEAD WITHOUT CONTRAST TECHNIQUE: Contiguous axial images were obtained from the base of the skull through the vertex without intravenous contrast. COMPARISON:  CT head 03/22/2020. FINDINGS: Brain: There is no evidence of acute intracranial hemorrhage, mass lesion, brain edema or extra-axial fluid collection. The ventricles and subarachnoid spaces are appropriately sized for age. There is no CT evidence of acute cortical infarction. Vascular: Minimal intracranial atherosclerosis. No hyperdense vessel identified. Skull: Negative for fracture or focal lesion. Sinuses/Orbits: The visualized paranasal sinuses and mastoid air cells are clear. No orbital abnormalities are seen. Other: None. IMPRESSION: Stable unremarkable noncontrast head CT. Electronically Signed   By: Richardean Sale M.D.   On: 03/08/2021 13:38   DG Chest Port 1 View  Result Date: 03/08/2021 CLINICAL DATA:  Altered mental status.  Confusion. EXAM: PORTABLE CHEST 1 VIEW COMPARISON:  06/10/2018 FINDINGS: Heart size is normal. Dual lead pacemaker as seen previously. Aortic atherosclerotic calcification is noted. The lungs are clear. The vascularity is normal. No acute bone finding. IMPRESSION: No active disease. Electronically Signed   By: Nelson Chimes M.D.   On: 03/08/2021 13:49    EKG:  Independently reviewed. Sinus, no st elevations  Assessment/Plan Acute metabolic encephalopathy ?UTI     - place in obs, tele     - start rocephin     - CTH is negative     - elevated lactic acid? Dehydration? Will add fluids as well     - could also be a case of polypharmacy; hold pain meds  for tonight  Anxiety/Depression     - continue home regimen when confirmed  Hypokalemia     - replace K+, check Mg2+  Lactic acidosis Elevated bilirubin     - fluids     - check fractionated bili; other LFTs ok  DVT prophylaxis: SCDs  Code Status: FULL  Family Communication: With dtr by phone  Consults called: None   Status is: Observation  The patient remains OBS appropriate and will d/c before 2 midnights.  Jonnie Finner DO [facility]  If 7PM-7AM, please contact night-coverage [email or web address]  03/08/2021, 4:31 PM

## 2021-03-08 NOTE — ED Triage Notes (Signed)
Pt bib ems for AMS. Pt was found sitting on floor by daughter confused.  Per ems pt was 76% on RA upon arrival to pt's home. Pt placed on NRB sats 100%.  Pt 99% on RA upon arrival to ED. Pt A&Ox2, pt states year is 79.

## 2021-03-08 NOTE — ED Provider Notes (Signed)
[facility] DEPT Provider Note   CSN: [id number] Arrival date & time: 03/08/21  1139     History Chief Complaint  Patient presents with   Altered Mental Status    REMMI ARMENTEROS is a 65 y.o. female.  HPI Patient brought by EMS for altered mental status.  Patient was found by her daughter sitting in her house and the floor confused and poorly responsive.  On EMS arrival, reported oxygen saturation was 78% on room air.  Baseline patient does not use oxygen.  At time of evaluation, patient is awake and alert.  She is not showing signs of acute distress but mild confusion.  Patient reports she is not really sure what happened.  She knows that she has been weak for a while but having a lot of pain in her legs but is not endorsing other positives on review of systems.  Patient is oriented to person and place but perseverates on thinking the year is 44.  I had a conversation on the phone with the patient's daughter.  She reports that she has been exhibiting increasing weakness and failure independently at home, needing increasing help over the past months.  Patient's daughter reports she is going to be moving in with her within the next month.  She is doing this to assist her mom with ADLs.  Patient's daughter reports patient had sepsis last year and went in and out of rehabilitation therapy.  She reports that she was very weak after that and never really seem to bounce back.  She reports her memory seems to be failing and questions if she has early dementia.  Patient's daughter reports that patient has been extremely depressed for about 5 years.  The patient's husband committed suicide 5 years ago.  Patient's daughter reports that patient has not made recent suicide threats but last year sent a text to her and her sister about "going to join dad".  Patient's daughter reports the patient does take a lot of medications for pain control and anxiety.  Patient daughter reports  the patient does go to a counselor but mostly go so she gets her Xanax prescription filled.  She also has an oxycodone and Seroquel.  Patient's daughter believes she does take mostly his medications regularly.  Patient's daughter reports the patient spends a lot of her time sleeping in bed and is very inactive.  Patient daughter reports that she and her sister questioned if perhaps her mother had overdosed but she checked the medications and could not tell that any were specifically missing or empty.    Past Medical History:  Diagnosis Date   Anxiety    anxiety and mild depressive order systoms   Bipolar disorder ([place])     (02/20/2013)   Cervical cancer ([place]) 03/19/1980   Depression    Dysrhythmia    Headache(784.0)    "monthly" (02/20/2013)   Pacemaker    medtronic   PONV (postoperative nausea and vomiting)    Sinus pause 02/14/2013   Syncope and collapse    echo-April 12,2012-EF 55% Echo normal; recorder with prolonged sinus pauses --> status post   Tobacco abuse    Vertigo     Patient Active Problem List   Diagnosis Date Noted   Degeneration of lumbar intervertebral disc 08/16/2020   Sepsis secondary to UTI ([place]) 04/17/2020   COVID-19 virus infection 04/17/2020   Hypoglycemia without diagnosis of diabetes mellitus 04/17/2020   Sepsis due to pneumonia ([place]) 04/17/2020   Physical deconditioning  Myositis 03/03/2020   HCAP (healthcare-associated pneumonia) 02/21/2020   Cough 02/21/2020   Generalized weakness 02/21/2020   GAD (generalized anxiety disorder) 02/21/2020   Proximal muscle weakness 02/08/2020   Dysphagia 02/08/2020   Gait disturbance 01/04/2020   Elevated CK 01/04/2020   Weakness of both lower extremities 01/04/2020   Encephalopathy    Polypharmacy    CAP (community acquired pneumonia) 12/18/2019   Fall 12/18/2019   Hypokalemia    Abnormal liver function test    Cervical post-laminectomy syndrome 10/21/2019   Cervical spondylosis 09/23/2019   Bilateral  elbow joint pain 08/26/2019   Memory loss 07/02/2019   Depression with anxiety 07/02/2019   B12 deficiency 07/02/2019   Lumbar radiculopathy 02/10/2019   History of fusion of lumbar spine 02/10/2019   Dysesthesia 02/10/2019   Retroperitoneal fibrosis    Pelvic adhesions    Left ovarian cyst 11/17/2018   Elevated tumor markers 11/17/2018   Dyspnea 09/15/2018   Chest pain of uncertain etiology 60/12/9321   Laryngopharyngeal reflux (LPR) 05/19/2018   Bipolar disorder ([place]) 04/16/2018   Insomnia 04/16/2018   Attention deficit hyperactivity disorder (ADHD) 04/16/2018   Pain in right knee 04/04/2017   Neck pain on right side 09/09/2014   Pseudoarthrosis of lumbar spine 04/15/2014   Family history of arrhythmogenic right ventricular cardiomyopathy 03/30/2013   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 02/20/2013   Sinus pause 02/14/2013   Hx of syncope- s/p Loop recorder 11/06/12 10/29/2012   Vertigo 10/29/2012   Tobacco abuse 10/29/2012   ANXIETY 11/14/2009   CERUMEN IMPACTION, RIGHT 11/14/2009   BENIGN POSITIONAL VERTIGO 11/14/2009   EAR PAIN, RIGHT 11/14/2009   SCIATICA 05/09/2009    Past Surgical History:  Procedure Laterality Date   ABDOMINAL HYSTERECTOMY  03/19/1980   "partial"   BACK SURGERY     CERVICAL FUSION Left 11/2013   C4-C6   CERVICAL FUSION     3,4,5,   INSERT / REPLACE / REMOVE PACEMAKER  02/20/2013   medtronic   LOOP RECORDER IMPLANT N/A 11/06/2012   Procedure: LINQ LOOP RECORDER IMPLANT;  Surgeon: Sanda Klein, MD;  Location: [facility]ATH [facility];  Service: Cardiovascular;  Laterality: N/A;   NECK SURGERY  11/17/2013   PACEMAKER PLACEMENT N/A 02/16/2013   PERMANENT PACEMAKER INSERTION N/A 02/20/2013   Procedure: PERMANENT PACEMAKER INSERTION;  Surgeon: Sanda Klein, MD;  Location: [facility];  Service: Cardiovascular;  Laterality: N/A;   POSTERIOR LUMBAR FUSION  ~ 2009   ROBOTIC ASSISTED SALPINGO OOPHERECTOMY Bilateral 11/25/2018   Procedure: XI ROBOTIC  ASSISTED BILATERAL SALPINGO OOPHORECTOMY and LYSIS OF ADHESIONS.;  Surgeon: Everitt Amber, MD;  Location: [facility];  Service: Gynecology;  Laterality: Bilateral;   SPINAL FUSION  03/19/2014   TONSILLECTOMY     TRANSTHORACIC ECHOCARDIOGRAM  07/11/2010   The left atrial size is normal.There is no evidence of mitral vavle prolaspe.Right ventriicular systolic pressure is normal . Injection of contrast documented no interatrial shunt. Essentially normal 2D echo -doppler study      OB History   No obstetric history on file.     Family History  Adopted: Yes  Problem Relation Age of Onset   Heart attack Brother     Social History   Tobacco Use   Smoking status: Every Day    Packs/day: 0.75    Years: 40.00    Pack years: 30.00    Types: Cigarettes   Smokeless tobacco: Never  Vaping Use   Vaping Use: Never used  Substance Use Topics   Alcohol  use: No   Drug use: No    Home Medications Prior to Admission medications   Medication Sig Start Date End Date Taking? Authorizing Provider  acetaminophen (TYLENOL) 325 MG tablet Take 2 tablets (650 mg total) by mouth every 6 (six) hours as needed for mild pain (or Fever >/= 101). 02/26/20   Domenic Polite, MD  ALPRAZolam Duanne Moron) 1 MG tablet Take 1 tablet (1 mg total) by mouth 3 (three) times daily as needed for anxiety. 01/26/21   Donnal Moat T, PA-C  cyclobenzaprine (FLEXERIL) 10 MG tablet Take 10 mg by mouth at bedtime as needed for muscle spasms. 12/30/19   [provider]  feeding supplement (ENSURE ENLIVE / ENSURE PLUS) LIQD Take 237 mLs by mouth 3 (three) times daily between meals. 04/21/20   Ghimire, Henreitta Leber, MD  hydrOXYzine (ATARAX/VISTARIL) 50 MG tablet Take 1-2 tablets (50-100 mg total) by mouth at bedtime as needed. 01/26/21   Addison Lank, PA-C  Multiple Vitamin (MULTIVITAMIN WITH MINERALS) TABS tablet Take 1 tablet by mouth daily. Patient not taking: No sig reported 04/22/20   Jonetta Osgood, MD  Nutritional  Supplements (,FEEDING SUPPLEMENT, PROSOURCE PLUS) liquid Take 30 mLs by mouth 2 (two) times daily between meals. Patient not taking: No sig reported 04/21/20   Jonetta Osgood, MD  Oxycodone HCl 10 MG TABS Take 10 mg by mouth 3 (three) times daily as needed. 01/10/21   [provider]  QUEtiapine (SEROQUEL) 25 MG tablet Take 1 tablet (25 mg total) by mouth at bedtime. 01/26/21   Donnal Moat T, PA-C  risperiDONE (RISPERDAL) 3 MG tablet Take 1 tablet (3 mg total) by mouth at bedtime. 01/26/21   Donnal Moat T, PA-C  vitamin B-12 1000 MCG tablet Take 1 tablet (1,000 mcg total) by mouth daily. 02/27/20   Domenic Polite, MD    Allergies    Antibiotic ear [neomycin-polymyxin-hc] and Penicillins  Review of Systems   Review of Systems 10 systems reviewed and negative except as per HPI Physical Exam Updated Vital Signs BP (!) 160/83    Pulse 64    Temp 97.8 F (36.6 C) (Oral)    Resp 14    Ht 5\' 8"  (1.727 m)    Wt 54.4 kg    SpO2 100%    BMI 18.25 kg/m   Physical Exam Constitutional:      Comments: Patient is alert.  She is on room air.  Oxygen saturations at 99 to 100%.  Vital signs on monitor are stable.  Patient is very thin in appearance.  She is answering questions.  HENT:     Head: Normocephalic and atraumatic.     Mouth/Throat:     Mouth: Mucous membranes are dry.     Pharynx: Oropharynx is clear.  Eyes:     Extraocular Movements: Extraocular movements intact.     Pupils: Pupils are equal, round, and reactive to light.  Cardiovascular:     Rate and Rhythm: Normal rate and regular rhythm.  Pulmonary:     Effort: Pulmonary effort is normal.     Breath sounds: Normal breath sounds.  Abdominal:     General: There is no distension.     Palpations: Abdomen is soft.     Tenderness: There is no abdominal tenderness. There is no guarding.  Musculoskeletal:     Comments: Patient has significant diffuse muscular atrophy.  No extremity deformities or apparent acute injuries.   No significant peripheral edema.  Skin:    General: Skin  is warm and dry.     Coloration: Skin is pale.  Neurological:     Comments: Patient is alert.  She is oriented to person and place.  She is confused as to the year.  She follows commands with coaching.  She will perform grip strength.  Grips are generally slightly weak seemingly effort related.  No localizing weakness.  With coaching, patient can do heel shin tracing with each extremity.  No significant ataxia.  Patient does require coaching to do these maneuvers.  Psychiatric:     Comments: Patient's affect is slightly flat and vague.  She is interactive.    ED Results / Procedures / Treatments   Labs (all labs ordered are listed, but only abnormal results are displayed) Labs Reviewed  COMPREHENSIVE METABOLIC PANEL - Abnormal; Notable for the following components:      Result Value   Potassium 3.2 (*)    Glucose, Bld 105 (*)    Total Bilirubin 2.8 (*)    All other components within normal limits  ACETAMINOPHEN LEVEL - Abnormal; Notable for the following components:   Acetaminophen (Tylenol), Serum <10 (*)    All other components within normal limits  LACTIC ACID, PLASMA - Abnormal; Notable for the following components:   Lactic Acid, Venous 2.7 (*)    All other components within normal limits  SALICYLATE LEVEL - Abnormal; Notable for the following components:   Salicylate Lvl <7.3 (*)    All other components within normal limits  CBC WITH DIFFERENTIAL/PLATELET - Abnormal; Notable for the following components:   RBC 5.82 (*)    Hemoglobin 15.9 (*)    HCT 48.4 (*)    All other components within normal limits  RESP PANEL BY RT-PCR (FLU A&B, COVID) ARPGX2  ETHANOL  LIPASE, BLOOD  PROTIME-INR  BLOOD GAS, VENOUS  AMMONIA  TSH  MAGNESIUM  PHOSPHORUS  LACTIC ACID, PLASMA  URINALYSIS, ROUTINE W REFLEX MICROSCOPIC  RAPID URINE DRUG SCREEN, HOSP PERFORMED  TROPONIN I (HIGH SENSITIVITY)    EKG None  Radiology CT Head  Wo Contrast  Result Date: 03/08/2021 CLINICAL DATA:  Mental status change, unknown cause. Found down. Confusion. EXAM: CT HEAD WITHOUT CONTRAST TECHNIQUE: Contiguous axial images were obtained from the base of the skull through the vertex without intravenous contrast. COMPARISON:  CT head 03/22/2020. FINDINGS: Brain: There is no evidence of acute intracranial hemorrhage, mass lesion, brain edema or extra-axial fluid collection. The ventricles and subarachnoid spaces are appropriately sized for age. There is no CT evidence of acute cortical infarction. Vascular: Minimal intracranial atherosclerosis. No hyperdense vessel identified. Skull: Negative for fracture or focal lesion. Sinuses/Orbits: The visualized paranasal sinuses and mastoid air cells are clear. No orbital abnormalities are seen. Other: None. IMPRESSION: Stable unremarkable noncontrast head CT. Electronically Signed   By: Richardean Sale M.D.   On: 03/08/2021 13:38   DG Chest Port 1 View  Result Date: 03/08/2021 CLINICAL DATA:  Altered mental status.  Confusion. EXAM: PORTABLE CHEST 1 VIEW COMPARISON:  06/10/2018 FINDINGS: Heart size is normal. Dual lead pacemaker as seen previously. Aortic atherosclerotic calcification is noted. The lungs are clear. The vascularity is normal. No acute bone finding. IMPRESSION: No active disease. Electronically Signed   By: Nelson Chimes M.D.   On: 03/08/2021 13:49    Procedures Procedures   Medications Ordered in ED Medications  0.9 %  sodium chloride infusion ( Intravenous New Bag/Given 03/08/21 1324)    ED Course  I have reviewed the triage vital signs and the  nursing notes.  Pertinent labs & imaging results that were available during my care of the patient were reviewed by me and considered in my medical decision making (see chart for details).    MDM Rules/Calculators/A&P                         Patient presents as outlined.  She was found with significantly altered mental status which has  subsequently improved.  Reportedly she was hypoxic on EMS arrival.  Diagnostic evaluation is not revealing specific etiology.  At this time, I have high suspicion for polypharmacy.  Patient is on extensive sedating medications.  Other consideration is possible intentional overdose.  There is no evidence at the site or note or report by the patient that there is any intentional overdose although her daughter reports history of severe depression and previously expressing some intent around suicide.  Patient's daughter expressed concern for possible early dementia versus an undiagnosed TIA at some point.  There is no time of onset of an acute change.  Patient did have a prolonged and difficult hospitalization last year with sepsis, possibly cognitive loss at that time.  Currently plan for admission for observation for encephalopathy rule out polypharmacy versus dementia\cognitive impairment of other etiology.  Final Clinical Impression(s) / ED Diagnoses Final diagnoses:  Altered mental status, unspecified altered mental status type    Rx / DC Orders ED Discharge Orders     None        Charlesetta Shanks, MD 03/08/21 1431

## 2021-03-09 DIAGNOSIS — R4182 Altered mental status, unspecified: Secondary | ICD-10-CM | POA: Diagnosis not present

## 2021-03-09 DIAGNOSIS — R17 Unspecified jaundice: Secondary | ICD-10-CM | POA: Diagnosis not present

## 2021-03-09 DIAGNOSIS — G4700 Insomnia, unspecified: Secondary | ICD-10-CM | POA: Diagnosis not present

## 2021-03-09 DIAGNOSIS — Z8249 Family history of ischemic heart disease and other diseases of the circulatory system: Secondary | ICD-10-CM | POA: Diagnosis not present

## 2021-03-09 DIAGNOSIS — R410 Disorientation, unspecified: Secondary | ICD-10-CM | POA: Diagnosis present

## 2021-03-09 DIAGNOSIS — Z90710 Acquired absence of both cervix and uterus: Secondary | ICD-10-CM | POA: Diagnosis not present

## 2021-03-09 DIAGNOSIS — E538 Deficiency of other specified B group vitamins: Secondary | ICD-10-CM | POA: Diagnosis not present

## 2021-03-09 DIAGNOSIS — Z79899 Other long term (current) drug therapy: Secondary | ICD-10-CM | POA: Diagnosis not present

## 2021-03-09 DIAGNOSIS — Z880 Allergy status to penicillin: Secondary | ICD-10-CM | POA: Diagnosis not present

## 2021-03-09 DIAGNOSIS — G8929 Other chronic pain: Secondary | ICD-10-CM | POA: Diagnosis not present

## 2021-03-09 DIAGNOSIS — Z950 Presence of cardiac pacemaker: Secondary | ICD-10-CM | POA: Diagnosis not present

## 2021-03-09 DIAGNOSIS — E872 Acidosis, unspecified: Secondary | ICD-10-CM | POA: Diagnosis not present

## 2021-03-09 DIAGNOSIS — I700 Atherosclerosis of aorta: Secondary | ICD-10-CM | POA: Diagnosis not present

## 2021-03-09 DIAGNOSIS — R64 Cachexia: Secondary | ICD-10-CM | POA: Diagnosis not present

## 2021-03-09 DIAGNOSIS — Z681 Body mass index (BMI) 19 or less, adult: Secondary | ICD-10-CM | POA: Diagnosis not present

## 2021-03-09 DIAGNOSIS — R69 Illness, unspecified: Secondary | ICD-10-CM | POA: Diagnosis not present

## 2021-03-09 DIAGNOSIS — F419 Anxiety disorder, unspecified: Secondary | ICD-10-CM | POA: Diagnosis not present

## 2021-03-09 DIAGNOSIS — R636 Underweight: Secondary | ICD-10-CM | POA: Diagnosis not present

## 2021-03-09 DIAGNOSIS — E876 Hypokalemia: Secondary | ICD-10-CM | POA: Diagnosis not present

## 2021-03-09 DIAGNOSIS — Z888 Allergy status to other drugs, medicaments and biological substances status: Secondary | ICD-10-CM | POA: Diagnosis not present

## 2021-03-09 DIAGNOSIS — Z20822 Contact with and (suspected) exposure to covid-19: Secondary | ICD-10-CM | POA: Diagnosis not present

## 2021-03-09 DIAGNOSIS — Z981 Arthrodesis status: Secondary | ICD-10-CM | POA: Diagnosis not present

## 2021-03-09 DIAGNOSIS — F319 Bipolar disorder, unspecified: Secondary | ICD-10-CM | POA: Diagnosis present

## 2021-03-09 DIAGNOSIS — G9341 Metabolic encephalopathy: Secondary | ICD-10-CM | POA: Diagnosis not present

## 2021-03-09 DIAGNOSIS — R29700 NIHSS score 0: Secondary | ICD-10-CM | POA: Diagnosis not present

## 2021-03-09 DIAGNOSIS — Z8541 Personal history of malignant neoplasm of cervix uteri: Secondary | ICD-10-CM | POA: Diagnosis not present

## 2021-03-09 DIAGNOSIS — F17210 Nicotine dependence, cigarettes, uncomplicated: Secondary | ICD-10-CM | POA: Diagnosis not present

## 2021-03-09 LAB — CBC
HCT: 43.8 % (ref 36.0–46.0)
Hemoglobin: 14.5 g/dL (ref 12.0–15.0)
MCH: 27.5 pg (ref 26.0–34.0)
MCHC: 33.1 g/dL (ref 30.0–36.0)
MCV: 83 fL (ref 80.0–100.0)
Platelets: 229 10*3/uL (ref 150–400)
RBC: 5.28 MIL/uL — ABNORMAL HIGH (ref 3.87–5.11)
RDW: 14.9 % (ref 11.5–15.5)
WBC: 9.6 10*3/uL (ref 4.0–10.5)
nRBC: 0 % (ref 0.0–0.2)

## 2021-03-09 LAB — COMPREHENSIVE METABOLIC PANEL
ALT: 14 U/L (ref 0–44)
AST: 22 U/L (ref 15–41)
Albumin: 4 g/dL (ref 3.5–5.0)
Alkaline Phosphatase: 46 U/L (ref 38–126)
Anion gap: 13 (ref 5–15)
BUN: 17 mg/dL (ref 8–23)
CO2: 23 mmol/L (ref 22–32)
Calcium: 8.6 mg/dL — ABNORMAL LOW (ref 8.9–10.3)
Chloride: 99 mmol/L (ref 98–111)
Creatinine, Ser: 0.81 mg/dL (ref 0.44–1.00)
GFR, Estimated: >60 mL/min (ref >60)
Glucose, Bld: 70 mg/dL (ref 70–99)
Potassium: 3.5 mmol/L (ref 3.5–5.1)
Sodium: 135 mmol/L (ref 135–145)
Total Bilirubin: 2.6 mg/dL — ABNORMAL HIGH (ref 0.3–1.2)
Total Protein: 6.5 g/dL (ref 6.5–8.1)

## 2021-03-09 LAB — HIV ANTIBODY (ROUTINE TESTING W REFLEX): HIV Screen 4th Generation wRfx: NONREACTIVE

## 2021-03-09 MED ORDER — ALPRAZOLAM 0.5 MG PO TABS: 0.5000 mg | ORAL_TABLET | Freq: Three times a day (TID) | ORAL | Status: DC | PRN

## 2021-03-09 MED ORDER — VITAMIN B-12 1000 MCG PO TABS
500.0000 ug | ORAL_TABLET | Freq: Every day | ORAL | Status: DC
  Administered 2021-03-10 – 2021-03-12 (×3): 500 ug via ORAL
  Filled 2021-03-09 (×3): qty 1

## 2021-03-09 MED ORDER — ADULT MULTIVITAMIN W/MINERALS CH
1.0000 | ORAL_TABLET | Freq: Every day | ORAL | Status: DC
  Administered 2021-03-10 – 2021-03-12 (×3): 1 via ORAL
  Filled 2021-03-09 (×3): qty 1

## 2021-03-09 MED ORDER — SODIUM CHLORIDE 0.9 % IV SOLN: INTRAVENOUS | Status: DC | PRN

## 2021-03-09 NOTE — ED Notes (Signed)
Able to stand with walker, unable to walk or take steps

## 2021-03-09 NOTE — Progress Notes (Addendum)
PROGRESS NOTE    [name]   XAJ:[id number]  DOB: [birth year]  DOA: 03/08/2021 PCP: Lujean Amel, MD   Brief Narrative:  [name] is a 65 y/o female with anxiety/depression, chronic leg pain, insomnia who presented after being found on the floor confused by his daughter. She was last noted to be her norm 2 days before. In the ED, she complained of dysuria and UA was suggestive of an infection and thus she was started on Ceftriaxone.   Subjective: Still confused today. Thinks her daughter brought her [employer] and she ate a Radiographer, therapeutic (she did not) but does know that it is Dec, 2022 and she is in the hospital.      Assessment & Plan:   Principal Problem:   Acute metabolic encephalopathy H/o Bipolar disorder and Insomnia - remains confused today - head CT unrevealing - cannot have an MRI due to pacer - daughter asking for a psych eval which I have ordered - She takes Xanax 1 mg TID PRN - will resume at 0.5 Mg TID to prevent withdrawal - resume Serequel for insomnia- hold Risperdal and Hydroxyzine  Active Problems: Chronic pain in her leg - takes Oxycodone PRN and Zanaflex- hold for now  Vit B12 deficinecy - resume 500 mcg daily    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     Time spent in minutes: 35 DVT prophylaxis: SCDs Start: 03/08/21 1729  Code Status: full code Family Communication: daughter [name] of Care: Kim of care: Med-Surg Disposition Plan:  Status is: Observation  The patient will require care spanning > 2 midnights and should be moved to inpatient because: ongoing confusion  Consultants:  Psych consulted Procedures:  none Antimicrobials:  Anti-infectives (From admission, onward)    Start     Dose/Rate Route Frequency Ordered Stop   03/08/21 1730  cefTRIAXone (ROCEPHIN) 1 g in sodium chloride 0.9 % 100 mL IVPB        1 g 200 mL/hr over 30 Minutes Intravenous Every 24 hours 03/08/21 1728           Objective: Vitals:   03/09/21 1000 03/09/21 1030 03/09/21 1100 03/09/21 1130  BP: 138/79 139/78 139/78 138/80  Pulse: 66 68 69 66  Resp: 14 14 15 17   Temp:      TempSrc:      SpO2: 99% 98% 99% 99%  Weight:      Height:        Intake/Output Summary (Last 24 hours) at 03/09/2021 1402 Last data filed at 03/08/2021 1729 Gross per 24 hour  Intake 510.42 ml  Output 350 ml  Net 160.42 ml   Filed Weights   03/08/21 1149  Weight: 54.4 kg    Examination: General exam: Appears comfortable  HEENT: PERRLA, oral mucosa moist, no sclera icterus or thrush Respiratory system: Clear to auscultation. Respiratory effort normal. Cardiovascular system: S1 & S2 heard, RRR.   Gastrointestinal system: Abdomen soft, non-tender, nondistended. Normal bowel sounds. Central nervous system: Alert and oriented. No focal neurological deficits. Extremities: No cyanosis, clubbing or edema Skin: No rashes or ulcers Psychiatry:  Mood & affect appropriate.     Data Reviewed: I have personally reviewed following labs and imaging studies  CBC: Recent Labs  Lab 03/08/21 1308 03/09/21 0330  WBC 9.9 9.6  NEUTROABS 7.6  --   HGB 15.9* 14.5  HCT 48.4* 43.8  MCV 83.2 83.0  PLT 244 094   Basic Metabolic Panel: Recent Labs  Lab 03/08/21 1308 03/08/21  1729 03/09/21 0330  NA 137  --  135  K 3.2*  --  3.5  CL 99  --  99  CO2 25  --  23  GLUCOSE 105*  --  70  BUN 12  --  17  CREATININE 0.77  --  0.81  CALCIUM 9.2  --  8.6*  MG 2.1 2.1  --   PHOS 3.8  --   --    GFR: Estimated Creatinine Clearance: 61.1 mL/min (by C-G formula based on SCr of 0.81 mg/dL). Liver Function Tests: Recent Labs  Lab 03/08/21 1308 03/08/21 1729 03/09/21 0330  AST 39  --  22  ALT 18  --  14  ALKPHOS 56  --  46  BILITOT 2.8* 2.1* 2.6*  PROT 7.5  --  6.5  ALBUMIN 4.8  --  4.0   Recent Labs  Lab 03/08/21 1308  LIPASE 29   Recent Labs  Lab 03/08/21 1309  AMMONIA 15   Coagulation Profile: Recent  Labs  Lab 03/08/21 1308  INR 1.0   Cardiac Enzymes: No results for input(s): CKTOTAL, CKMB, CKMBINDEX, TROPONINI in the last 168 hours. BNP (last 3 results) No results for input(s): PROBNP in the last 8760 hours. HbA1C: No results for input(s): HGBA1C in the last 72 hours. CBG: No results for input(s): GLUCAP in the last 168 hours. Lipid Profile: No results for input(s): CHOL, HDL, LDLCALC, TRIG, CHOLHDL, LDLDIRECT in the last 72 hours. Thyroid Function Tests: Recent Labs    03/08/21 1312  TSH 0.693   Anemia Panel: No results for input(s): VITAMINB12, FOLATE, FERRITIN, TIBC, IRON, RETICCTPCT in the last 72 hours. Urine analysis:    Component Value Date/Time   COLORURINE AMBER (A) 03/08/2021 1421   APPEARANCEUR HAZY (A) 03/08/2021 1421   LABSPEC 1.021 03/08/2021 1421   PHURINE 5.0 03/08/2021 1421   GLUCOSEU NEGATIVE 03/08/2021 1421   HGBUR NEGATIVE 03/08/2021 1421   BILIRUBINUR NEGATIVE 03/08/2021 1421   KETONESUR 20 (A) 03/08/2021 1421   PROTEINUR 30 (A) 03/08/2021 1421   NITRITE NEGATIVE 03/08/2021 1421   LEUKOCYTESUR TRACE (A) 03/08/2021 1421     Radiology Studies: CT Head Wo Contrast  Result Date: 03/08/2021 CLINICAL DATA:  Mental status change, unknown cause. Found down. Confusion. EXAM: CT HEAD WITHOUT CONTRAST TECHNIQUE: Contiguous axial images were obtained from the base of the skull through the vertex without intravenous contrast. COMPARISON:  CT head 03/22/2020. FINDINGS: Brain: There is no evidence of acute intracranial hemorrhage, mass lesion, brain edema or extra-axial fluid collection. The ventricles and subarachnoid spaces are appropriately sized for age. There is no CT evidence of acute cortical infarction. Vascular: Minimal intracranial atherosclerosis. No hyperdense vessel identified. Skull: Negative for fracture or focal lesion. Sinuses/Orbits: The visualized paranasal sinuses and mastoid air cells are clear. No orbital abnormalities are seen. Other:  None. IMPRESSION: Stable unremarkable noncontrast head CT. Electronically Signed   By: Richardean Sale M.D.   On: 03/08/2021 13:38   DG Chest Port 1 View  Result Date: 03/08/2021 CLINICAL DATA:  Altered mental status.  Confusion. EXAM: PORTABLE CHEST 1 VIEW COMPARISON:  06/10/2018 FINDINGS: Heart size is normal. Dual lead pacemaker as seen previously. Aortic atherosclerotic calcification is noted. The lungs are clear. The vascularity is normal. No acute bone finding. IMPRESSION: No active disease. Electronically Signed   By: Nelson Chimes M.D.   On: 03/08/2021 13:49      Scheduled Meds:  multivitamin with minerals  1 tablet Oral Daily   vitamin B-12  500 mcg Oral Daily   Continuous Infusions:  cefTRIAXone (ROCEPHIN)  IV Stopped (03/09/21 0942)     LOS: 0 days      Debbe Odea, MD [facility] Pager: [email or web address] 03/09/2021, 2:02 PM

## 2021-03-10 ENCOUNTER — Other Ambulatory Visit: Payer: Self-pay | Admitting: Physician Assistant

## 2021-03-10 DIAGNOSIS — G4700 Insomnia, unspecified: Secondary | ICD-10-CM

## 2021-03-10 DIAGNOSIS — F411 Generalized anxiety disorder: Secondary | ICD-10-CM

## 2021-03-10 DIAGNOSIS — G4709 Other insomnia: Secondary | ICD-10-CM

## 2021-03-10 LAB — VITAMIN B12: Vitamin B-12: 2860 pg/mL — ABNORMAL HIGH (ref 180–914)

## 2021-03-10 NOTE — Progress Notes (Signed)
PROGRESS NOTE    [name]   OBS:[id number]  DOB: May 31, 1955  DOA: 03/08/2021 PCP: Lujean Amel, MD   Brief Narrative:  [name] is a 65 y/o female with anxiety/depression, chronic leg pain, insomnia who presented after being found on the floor confused by his daughter. She was last noted to be her norm 2 days before. In the ED, she complained of dysuria and UA was suggestive of an infection and thus she was started on Ceftriaxone. Pulse ox was initially 76% on room air at her home when checked by EMS but this was likely erroneous as by the time she arrived to the ED she was 99% and when I saw her for admission, she was stable on room air.    Subjective: She remains very confused today    Assessment & Plan:   Principal Problem:   Acute metabolic encephalopathy H/o Bipolar disorder and Insomnia - still very confused today - head was CT unrevealing - cannot have an MRI due to pacer - daughter, [name] saw patient and states patient was still confused on 12/22 and was asking for a psych eval- per psych note today the QTC was 585 when admitted which may be suggestive of an overdose on home meds- psych also recommends that we should consult neurology - She takes Xanax 1 mg TID PRN -  this has been resumed at 0.5 Mg TID PRN to prevent withdrawal - resume Serequel for insomnia - hold Risperdal and Hydroxyzine - psych as ordered UDS - have also ordered B12 and RPR (already takes daily B12) - Urine culture is not consistent with a UTI and I have stopped Ceftriaxone  Active Problems: Chronic pain in her leg - takes Oxycodone PRN and Zanaflex- hold for now- no complaints of any pain right now  Vit B12 deficinecy - resumed 500 mcg daily    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     Time spent in minutes: 35 DVT prophylaxis: SCDs Start: 03/08/21 1729  Code Status: full code Family Communication: daughter [name] Level of Care: Level of care:  Med-Surg Disposition Plan:  Status is: inpt  Consultants:  Psych  Neuro Procedures:  none Antimicrobials:  Anti-infectives (From admission, onward)    Start     Dose/Rate Route Frequency Ordered Stop   03/08/21 1730  cefTRIAXone (ROCEPHIN) 1 g in sodium chloride 0.9 % 100 mL IVPB        1 g 200 mL/hr over 30 Minutes Intravenous Every 24 hours 03/08/21 1728          Objective: Vitals:   03/09/21 2020 03/09/21 2154 03/10/21 0332 03/10/21 1402  BP: (!) 141/86 (!) 153/83 (!) 146/88 139/88  Pulse: 64 61 (!) 56 66  Resp: 18 16 16 18   Temp: 98.9 F (37.2 C) 98.3 F (36.8 C) 98.4 F (36.9 C) 98.5 F (36.9 C)  TempSrc: Oral Oral Oral   SpO2: 96% 97% 96% 100%  Weight:      Height:        Intake/Output Summary (Last 24 hours) at 03/10/2021 1515 Last data filed at 03/09/2021 2200 Gross per 24 hour  Intake 465.34 ml  Output 0 ml  Net 465.34 ml    Filed Weights   03/08/21 1149  Weight: 54.4 kg    Examination: General exam: Appears comfortable  HEENT: PERRLA, oral mucosa moist, no sclera icterus or thrush Respiratory system: Clear to auscultation. Respiratory effort normal. Cardiovascular system: S1 & S2 heard, RRR.   Gastrointestinal  system: Abdomen soft, non-tender, nondistended. Normal bowel sounds. Central nervous system: Alert and oriented. No focal neurological deficits. Extremities: No cyanosis, clubbing or edema Skin: No rashes or ulcers Psychiatry:  Mood & affect appropriate.     Data Reviewed: I have personally reviewed following labs and imaging studies  CBC: Recent Labs  Lab 03/08/21 1308 03/09/21 0330  WBC 9.9 9.6  NEUTROABS 7.6  --   HGB 15.9* 14.5  HCT 48.4* 43.8  MCV 83.2 83.0  PLT 244 478    Basic Metabolic Panel: Recent Labs  Lab 03/08/21 1308 03/08/21 1729 03/09/21 0330  NA 137  --  135  K 3.2*  --  3.5  CL 99  --  99  CO2 25  --  23  GLUCOSE 105*  --  70  BUN 12  --  17  CREATININE 0.77  --  0.81  CALCIUM 9.2  --  8.6*   MG 2.1 2.1  --   PHOS 3.8  --   --     GFR: Estimated Creatinine Clearance: 61.1 mL/min (by C-G formula based on SCr of 0.81 mg/dL). Liver Function Tests: Recent Labs  Lab 03/08/21 1308 03/08/21 1729 03/09/21 0330  AST 39  --  22  ALT 18  --  14  ALKPHOS 56  --  46  BILITOT 2.8* 2.1* 2.6*  PROT 7.5  --  6.5  ALBUMIN 4.8  --  4.0    Recent Labs  Lab 03/08/21 1308  LIPASE 29    Recent Labs  Lab 03/08/21 1309  AMMONIA 15    Coagulation Profile: Recent Labs  Lab 03/08/21 1308  INR 1.0    Cardiac Enzymes: No results for input(s): CKTOTAL, CKMB, CKMBINDEX, TROPONINI in the last 168 hours. BNP (last 3 results) No results for input(s): PROBNP in the last 8760 hours. HbA1C: No results for input(s): HGBA1C in the last 72 hours. CBG: No results for input(s): GLUCAP in the last 168 hours. Lipid Profile: No results for input(s): CHOL, HDL, LDLCALC, TRIG, CHOLHDL, LDLDIRECT in the last 72 hours. Thyroid Function Tests: Recent Labs    03/08/21 1312  TSH 0.693    Anemia Panel: No results for input(s): VITAMINB12, FOLATE, FERRITIN, TIBC, IRON, RETICCTPCT in the last 72 hours. Urine analysis:    Component Value Date/Time   COLORURINE AMBER (A) 03/08/2021 1421   APPEARANCEUR HAZY (A) 03/08/2021 1421   LABSPEC 1.021 03/08/2021 1421   PHURINE 5.0 03/08/2021 1421   GLUCOSEU NEGATIVE 03/08/2021 1421   HGBUR NEGATIVE 03/08/2021 1421   BILIRUBINUR NEGATIVE 03/08/2021 1421   KETONESUR 20 (A) 03/08/2021 1421   PROTEINUR 30 (A) 03/08/2021 1421   NITRITE NEGATIVE 03/08/2021 1421   LEUKOCYTESUR TRACE (A) 03/08/2021 1421     Radiology Studies: No results found.    Scheduled Meds:  multivitamin with minerals  1 tablet Oral Daily   vitamin B-12  500 mcg Oral Daily   Continuous Infusions:  sodium chloride Stopped (03/10/21 0000)   cefTRIAXone (ROCEPHIN)  IV Stopped (03/09/21 1747)     LOS: 1 day      Debbe Odea, MD [facility] Pager: [email or web address] 03/10/2021, 3:15 PM

## 2021-03-10 NOTE — Consult Note (Signed)
[facility] Consult   Reason for Consult: Acute onset of confusion Referring Physician: Dr. Wynelle Cleveland Patient Identification: [name] MRN:  [id number] Principal Diagnosis: Acute metabolic encephalopathy Diagnosis:  Principal Problem:   Acute metabolic encephalopathy Active Problems:   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   Bipolar disorder ([place])   Insomnia   Total Time spent with patient: 45 minutes  Subjective:   [name] is a 65 y.o. female patient admitted with metabolic encephalopathy.  Psych consult placed for acute onset of confusion.  Please contact daughter Allahna Husband for further information.  Patient remains with partial disorientation.  When assessing for orientation question patient tells me" I am at [facility] in [facility], today is March 21, 2021, Friday, Barbette Or, date of birth Jan 08, 1956 and she is 65 years old.  Despite pointing get calendar and patient reading December 23 as today's date, she continued to identify January 3 as today's date.  Patient continued to have an difficulty linking concepts.  She further denies any acute psychiatric symptoms currently.  She denies any history of previous psychiatric disorder such as depression, bipolar, schizophrenia, dementia.  She further denies any neurological conditions such as CVA, seizure, or neurocognitive disorder.  She reports compliance of her home medication, however does admit to possible incidental overdose although she is unable to recall.  As noted above patient is a poor historian, and remains confused at the time of this evaluation.  There does appear to be some concern regarding patient's medication as she is currently taking oxycodone and Xanax, Seroquel, risperidone, and hydroxyzine at home.  Despite being prescribed these medications, she continues to deny any previous existing psychiatric conditions.  Suspect patient does have underlying  psychiatric history, although it is not available at this time.  She does denies suicidal ideations, homicidal ideations, and auditory or visual hallucinations.  As of note patient does not have any nursing documentation consistent with hallucinations and or psychosis.  There is no documentation since patient left emergency room yesterday at 1500.  Have attempted to contact daughter Terre Hanneman, at [phone] unsuccessful both attempts.    Chart review from the emergency room physician shows daughter has concern that patient is having increasing weakness, failure to thrive at home, and requires assistance with ADLs.  Daughter reports mother has been very sick with sepsis, requiring multiple admissions to rehabilitation facilities.  Daughter also reports her mother seems to be falling and questions if she has early dementia.  Patient's daughter reports that patient has been extremely depressed for about 5 years, following her husband's suicide completion 5 years ago.  Daughter reports no recent suicide threats have been made, however patient did send a text last year that she was going to be joining dad soon.  Recommend continuing to reach out to daughter to obtain collateral information, as mother continues to be confused and disoriented.  HPI:  AARYANA BETKE is a 65 year old female with a history of anxiety/depression, chronic leg pain, insomnia who presented after being found on the floor confused by her daughter.  She was last noted to be normal 2 days before.  In the ED she complained of dysuria and UA was suggestive of an infection.  She was subsequently diagnosed with metabolic encephalopathy.  Past Psychiatric History: Anxiety and depression.  Currently takes Xanax 1 mg p.o. 3 times daily as needed.  Also takes Seroquel, Risperdal, and hydroxyzine at home.  Patient is currently under the services  of Helene Kelp hers at [facility], she is also receiving therapy through the same  office.  Risk to Self: Denies Risk to Others: Denies Prior Inpatient Therapy: Denies Prior Outpatient Therapy: [facility].  Past Medical History:  Past Medical History:  Diagnosis Date   Anxiety    anxiety and mild depressive order systoms   Bipolar disorder ([place])     (02/20/2013)   Cervical cancer ([place]) 03/19/1980   Depression    Dysrhythmia    Headache(784.0)    "monthly" (02/20/2013)   Pacemaker    medtronic   PONV (postoperative nausea and vomiting)    Sinus pause 02/14/2013   Syncope and collapse    echo-April 12,2012-EF 55% Echo normal; recorder with prolonged sinus pauses --> status post   Tobacco abuse    Vertigo     Past Surgical History:  Procedure Laterality Date   ABDOMINAL HYSTERECTOMY  03/19/1980   "partial"   BACK SURGERY     CERVICAL FUSION Left 11/2013   C4-C6   CERVICAL FUSION     3,4,5,   INSERT / REPLACE / REMOVE PACEMAKER  02/20/2013   medtronic   LOOP RECORDER IMPLANT N/A 11/06/2012   Procedure: LINQ LOOP RECORDER IMPLANT;  Surgeon: Sanda Klein, MD;  Location: [facility] CATH [facility];  Service: Cardiovascular;  Laterality: N/A;   NECK SURGERY  11/17/2013   PACEMAKER PLACEMENT N/A 02/16/2013   PERMANENT PACEMAKER INSERTION N/A 02/20/2013   Procedure: PERMANENT PACEMAKER INSERTION;  Surgeon: Sanda Klein, MD;  Location: [facility] CATH [facility];  Service: Cardiovascular;  Laterality: N/A;   POSTERIOR LUMBAR FUSION  ~ 2009   ROBOTIC ASSISTED SALPINGO OOPHERECTOMY Bilateral 11/25/2018   Procedure: XI ROBOTIC ASSISTED BILATERAL SALPINGO OOPHORECTOMY and LYSIS OF ADHESIONS.;  Surgeon: Everitt Amber, MD;  Location: [facility];  Service: Gynecology;  Laterality: Bilateral;   SPINAL FUSION  03/19/2014   TONSILLECTOMY     TRANSTHORACIC ECHOCARDIOGRAM  07/11/2010   The left atrial size is normal.There is no evidence of mitral vavle prolaspe.Right ventriicular systolic pressure is normal . Injection of contrast documented no interatrial shunt. Essentially normal 2D echo  -doppler study    Family History:  Family History  Adopted: Yes  Problem Relation Age of Onset   Heart attack Brother    Family Psychiatric  History: Unable to assess Social History:  Social History   Substance and Sexual Activity  Alcohol Use No     Social History   Substance and Sexual Activity  Drug Use No    Social History   Socioeconomic History   Marital status: Widowed    Spouse name: Not on file   Number of children: 3   Years of education: 38   Highest education level: Not on file  Occupational History   Not on file  Tobacco Use   Smoking status: Every Day    Packs/day: 0.75    Years: 40.00    Pack years: 30.00    Types: Cigarettes   Smokeless tobacco: Never  Vaping Use   Vaping Use: Never used  Substance and Sexual Activity   Alcohol use: No   Drug use: No   Sexual activity: Yes  Other Topics Concern   Not on file  Social History Narrative   Married 29 years . Has 3 children. Current smoker -1 pack a day-for 36 years .Alcohol :  1 beer on occasion.   She is soon to be a grandmother.  His before to going on a trip [place] to [place] to be closer to family when the baby is born.  She is extremely excited.   She very much would like to move back up to that area to be closer to family, but the whether is it just too cold in the winter.   Right handed    Soda daily   Lives alone   Social Determinants of Health   Financial Resource Strain: Low Risk    Difficulty of Paying Living Expenses: Not hard at all  Food Insecurity: No Food Insecurity   Worried About Charity fundraiser in the Last Year: Never true   Arboriculturist in the Last Year: Never true  Transportation Needs: No Transportation Needs   Lack of Transportation (Medical): No   Lack of Transportation (Non-Medical): No  Physical Activity: Not on file  Stress: No Stress Concern Present   Feeling of Stress : Only a little  Social Connections: Not on file   Additional Social  History:    Allergies:   Allergies  Allergen Reactions   Antibiotic Ear [Neomycin-Polymyxin-Hc]     Other reaction(s): Unknown   Penicillins Other (See Comments)    Yeast infection Did it involve swelling of the face/tongue/throat, SOB, or low BP? No Did it involve sudden or severe rash/hives, skin peeling, or any reaction on the inside of your mouth or nose? No Did you need to seek medical attention at a hospital or doctor's office? Yes When did it last happen? More than 5 years ago  If all above answers are "NO", may proceed with cephalosporin use.     Labs:  Results for orders placed or performed during the hospital encounter of 03/08/21 (from the past 48 hour(s))  Comprehensive metabolic panel     Status: Abnormal   Collection Time: 03/08/21  1:08 PM  Result Value Ref Range   Sodium 137 135 - 145 mmol/L   Potassium 3.2 (L) 3.5 - 5.1 mmol/L   Chloride 99 98 - 111 mmol/L   CO2 25 22 - 32 mmol/L   Glucose, Bld 105 (H) 70 - 99 mg/dL    Comment: Glucose reference range applies only to samples taken after fasting for at least 8 hours.   BUN 12 8 - 23 mg/dL   Creatinine, Ser 0.77 0.44 - 1.00 mg/dL   Calcium 9.2 8.9 - 10.3 mg/dL   Total Protein 7.5 6.5 - 8.1 g/dL   Albumin 4.8 3.5 - 5.0 g/dL   AST 39 15 - 41 U/L   ALT 18 0 - 44 U/L   Alkaline Phosphatase 56 38 - 126 U/L   Total Bilirubin 2.8 (H) 0.3 - 1.2 mg/dL   GFR, Estimated >60 >60 mL/min    Comment: (NOTE) Calculated using the CKD-EPI Creatinine Equation (2021)    Anion gap 13 5 - 15    Comment: Performed at [facility], [place] [address], [place], [place] [address]  Ethanol     Status: None   Collection Time: 03/08/21  1:08 PM  Result Value Ref Range   Alcohol, Ethyl (B) <10 <10 mg/dL    Comment: (NOTE) Lowest detectable limit for serum alcohol is 10 mg/dL.  For medical purposes only. Performed at [facility] [place] [address] [place], [place] [address]   Acetaminophen level      Status: Abnormal   Collection Time: 03/08/21  1:08 PM  Result Value Ref Range   Acetaminophen (Tylenol), Serum <10 (L) 10 - 30 ug/mL  Comment: (NOTE) Therapeutic concentrations vary significantly. A range of 10-30 ug/mL  may be an effective concentration for many patients. However, some  are best treated at concentrations outside of this range. Acetaminophen concentrations >150 ug/mL at 4 hours after ingestion  and >50 ug/mL at 12 hours after ingestion are often associated with  toxic reactions.  Performed at [facility], [place] [address] [place], [place] [address]   Lactic acid, plasma     Status: Abnormal   Collection Time: 03/08/21  1:08 PM  Result Value Ref Range   Lactic Acid, Venous 2.7 (HH) 0.5 - 1.9 mmol/L    Comment: CRITICAL RESULT CALLED TO, READ BACK BY AND VERIFIED WITH: BRATU,D. EMTP AT 1336 03/08/21 MULLINS,T Performed at [facility], [place] [address] [place], [place] [address]   Lipase, blood     Status: None   Collection Time: 03/08/21  1:08 PM  Result Value Ref Range   Lipase 29 11 - 51 U/L    Comment: Performed at [facility] [place] [address], [place], [place] [address]  Salicylate level     Status: Abnormal   Collection Time: 03/08/21  1:08 PM  Result Value Ref Range   Salicylate Lvl <1.0 (L) 7.0 - 30.0 mg/dL    Comment: Performed at [facility], [place] [address], [place], [place] [address]  Troponin I (High Sensitivity)     Status: None   Collection Time: 03/08/21  1:08 PM  Result Value Ref Range   Troponin I (High Sensitivity) 10 <18 ng/L    Comment: (NOTE) Elevated high sensitivity troponin I (hsTnI) values and significant  changes across serial measurements may suggest ACS but many other  chronic and acute conditions are known to elevate hsTnI results.  Refer to the "Links" section for chest pain algorithms and additional  guidance. Performed at [facility], [place] [address] [place], [place] [address]   CBC with Differential     Status: Abnormal   Collection Time: 03/08/21  1:08 PM  Result Value Ref Range   WBC 9.9 4.0 - 10.5 K/uL   RBC 5.82 (H) 3.87 - 5.11 MIL/uL   Hemoglobin 15.9 (H) 12.0 - 15.0 g/dL   HCT 48.4 (H) 36.0 - 46.0 %   MCV 83.2 80.0 - 100.0 fL   MCH 27.3 26.0 - 34.0 pg   MCHC 32.9 30.0 - 36.0 g/dL   RDW 15.0 11.5 - 15.5 %   Platelets 244 150 - 400 K/uL   nRBC 0.0 0.0 - 0.2 %   Neutrophils Relative % 77 %   Neutro Abs 7.6 1.7 - 7.7 K/uL   Lymphocytes Relative 17 %   Lymphs Abs 1.7 0.7 - 4.0 K/uL   Monocytes Relative 6 %   Monocytes Absolute 0.6 0.1 - 1.0 K/uL   Eosinophils Relative 0 %   Eosinophils Absolute 0.0 0.0 - 0.5 K/uL   Basophils Relative 0 %   Basophils Absolute 0.0 0.0 - 0.1 K/uL   Immature Granulocytes 0 %   Abs Immature Granulocytes 0.03 0.00 - 0.07 K/uL    Comment: Performed at [facility], [place] [address], [place], [place] [address]  Protime-INR     Status: None   Collection Time: 03/08/21  1:08 PM  Result Value Ref Range   Prothrombin Time 13.1 11.4 - 15.2 seconds   INR 1.0 0.8 - 1.2    Comment: (NOTE) INR goal varies based on device and disease states.  Performed at [facility], [place] [address] [place], [place] [address]   Magnesium     Status: None   Collection Time: 03/08/21  1:08 PM  Result Value Ref Range   Magnesium 2.1 1.7 - 2.4 mg/dL    Comment: Performed at [facility], [place] [address] [place], [place] [address]  Phosphorus     Status: None   Collection Time: 03/08/21  1:08 PM  Result Value Ref Range   Phosphorus 3.8 2.5 - 4.6 mg/dL    Comment: Performed at [facility], [place] [address], [place], [place] [address]  Ammonia     Status: None   Collection Time: 03/08/21  1:09 PM  Result Value Ref Range   Ammonia 15 9 - 35 umol/L    Comment: Performed at [facility], [place]  [address], [place], [place] [address]  TSH     Status: None   Collection Time: 03/08/21  1:12 PM  Result Value Ref Range   TSH 0.693 0.350 - 4.500 uIU/mL    Comment: Performed by a 3rd Generation assay with a functional sensitivity of <=0.01 uIU/mL. Performed at [facility], [place] [address] [place], [place] [address]   Blood gas, venous     Status: None   Collection Time: 03/08/21  1:21 PM  Result Value Ref Range   pH, Ven 7.375 7.250 - 7.430   pCO2, Ven 46.7 44.0 - 60.0 mmHg   pO2, Ven 40.8 32.0 - 45.0 mmHg   Bicarbonate 26.7 20.0 - 28.0 mmol/L   Acid-Base Excess 1.4 0.0 - 2.0 mmol/L   O2 Saturation 71.7 %   Patient temperature 98.6     Comment: Performed at [facility], [place] [address], [place],  [address]  Resp Panel by RT-PCR (Flu A&B, Covid) Nasopharyngeal Swab     Status: None   Collection Time: 03/08/21  1:22 PM   Specimen: Nasopharyngeal Swab; Nasopharyngeal(NP) swabs in vial transport medium  Result Value Ref Range   SARS Coronavirus 2 by RT PCR NEGATIVE NEGATIVE    Comment: (NOTE) SARS-CoV-2 target nucleic acids are NOT DETECTED.  The SARS-CoV-2 RNA is generally detectable in upper respiratory specimens during the acute phase of infection. The lowest concentration of SARS-CoV-2 viral copies this assay can detect is 138 copies/mL. A negative result does not preclude SARS-Cov-2 infection and should not be used as the sole basis for treatment or other patient management decisions. A negative result may occur with  improper specimen collection/handling, submission of specimen other than nasopharyngeal swab, presence of viral mutation(s) within the areas targeted by this assay, and inadequate number of viral copies(<138 copies/mL). A negative result must be combined with clinical observations, patient history, and epidemiological information. The expected result is Negative.  Fact Sheet for Patients:   EntrepreneurPulse.com.au  Fact Sheet for Healthcare Providers:  IncredibleEmployment.be  This test is no t yet approved or cleared by the [place] FDA and  has been authorized for detection and/or diagnosis of SARS-CoV-2 by FDA under an Emergency Use Authorization (EUA). This EUA will remain  in effect (meaning this test can be used) for the duration of the COVID-19 declaration under Section 564(b)(1) of the Act, 21 U.S.C.section 360bbb-3(b)(1), unless the authorization is terminated  or revoked sooner.       Influenza A by PCR NEGATIVE NEGATIVE   Influenza B by PCR NEGATIVE NEGATIVE    Comment: (NOTE) The Xpert Xpress SARS-CoV-2/FLU/RSV plus assay is intended  as an aid in the diagnosis of influenza from Nasopharyngeal swab specimens and should not be used as a sole basis for treatment. Nasal washings and aspirates are unacceptable for Xpert Xpress SARS-CoV-2/FLU/RSV testing.  Fact Sheet for Patients: EntrepreneurPulse.com.au  Fact Sheet for Healthcare Providers: IncredibleEmployment.be  This test is not yet approved or cleared by the [place] FDA and has been authorized for detection and/or diagnosis of SARS-CoV-2 by FDA under an Emergency Use Authorization (EUA). This EUA will remain in effect (meaning this test can be used) for the duration of the COVID-19 declaration under Section 564(b)(1) of the Act, 21 U.S.C. section 360bbb-3(b)(1), unless the authorization is terminated or revoked.  Performed at [facility], [place] [address] [place], [place] [address]   Urinalysis, Routine w reflex microscopic     Status: Abnormal   Collection Time: 03/08/21  2:21 PM  Result Value Ref Range   Color, Urine AMBER (A) YELLOW    Comment: BIOCHEMICALS MAY BE AFFECTED BY COLOR   APPearance HAZY (A) CLEAR   Specific Gravity, Urine 1.021 1.005 - 1.030   pH 5.0 5.0 - 8.0   Glucose, UA  NEGATIVE NEGATIVE mg/dL   Hgb urine dipstick NEGATIVE NEGATIVE   Bilirubin Urine NEGATIVE NEGATIVE   Ketones, ur 20 (A) NEGATIVE mg/dL   Protein, ur 30 (A) NEGATIVE mg/dL   Nitrite NEGATIVE NEGATIVE   Leukocytes,Ua TRACE (A) NEGATIVE   RBC / HPF 0-5 0 - 5 RBC/hpf   WBC, UA 11-20 0 - 5 WBC/hpf   Bacteria, UA MANY (A) NONE SEEN   Squamous Epithelial / LPF 0-5 0 - 5   Mucus PRESENT    Hyaline Casts, UA PRESENT     Comment: Performed at [facility], [place] [address] [place], [place] [address]  Urine rapid drug screen (hosp performed)     Status: Abnormal   Collection Time: 03/08/21  2:21 PM  Result Value Ref Range   Opiates NONE DETECTED NONE DETECTED   Cocaine NONE DETECTED NONE DETECTED   Benzodiazepines POSITIVE (A) NONE DETECTED   Amphetamines NONE DETECTED NONE DETECTED   Tetrahydrocannabinol NONE DETECTED NONE DETECTED   Barbiturates NONE DETECTED NONE DETECTED    Comment: (NOTE) DRUG SCREEN FOR MEDICAL PURPOSES ONLY.  IF CONFIRMATION IS NEEDED FOR ANY PURPOSE, NOTIFY LAB WITHIN 5 DAYS.  LOWEST DETECTABLE LIMITS FOR URINE DRUG SCREEN Drug Class                     Cutoff (ng/mL) Amphetamine and metabolites    1000 Barbiturate and metabolites    200 Benzodiazepine                 595 Tricyclics and metabolites     300 Opiates and metabolites        300 Cocaine and metabolites        300 THC                            50 Performed at [facility], [place] [address] [place], [place] [address]   Urine Culture     Status: None (Preliminary result)   Collection Time: 03/08/21  2:21 PM   Specimen: In/Out Cath Urine  Result Value Ref Range   Specimen Description      IN/OUT CATH URINE Performed at [place] [address], [place], [place] [address]    Special Requests  NONE Performed at [facility], [place] [address], [place], [place] [address]    Culture      CULTURE REINCUBATED FOR  BETTER GROWTH Performed at [facility], [place] [address] [place], [place] [address]    Report Status PENDING   Lactic acid, plasma     Status: None   Collection Time: 03/08/21  2:54 PM  Result Value Ref Range   Lactic Acid, Venous 1.2 0.5 - 1.9 mmol/L    Comment: Performed at [facility], [place] [address], [place], [place] [address]  Troponin I (High Sensitivity)     Status: None   Collection Time: 03/08/21  2:55 PM  Result Value Ref Range   Troponin I (High Sensitivity) 9 <18 ng/L    Comment: (NOTE) Elevated high sensitivity troponin I (hsTnI) values and significant  changes across serial measurements may suggest ACS but many other  chronic and acute conditions are known to elevate hsTnI results.  Refer to the "Links" section for chest pain algorithms and additional  guidance. Performed at [facility], [place] [address] [place], [place] [address]   HIV Antibody (routine testing w rflx)     Status: None   Collection Time: 03/08/21  5:29 PM  Result Value Ref Range   HIV Screen 4th Generation wRfx Non Reactive Non Reactive    Comment: Performed at [facility], [place] [address], [place], [place] [address]  Magnesium     Status: None   Collection Time: 03/08/21  5:29 PM  Result Value Ref Range   Magnesium 2.1 1.7 - 2.4 mg/dL    Comment: Performed at [facility], [place] [address], [place], [place] [address]  Bilirubin, fractionated(tot/dir/indir)     Status: Abnormal   Collection Time: 03/08/21  5:29 PM  Result Value Ref Range   Total Bilirubin 2.1 (H) 0.3 - 1.2 mg/dL   Bilirubin, Direct 0.3 (H) 0.0 - 0.2 mg/dL   Indirect Bilirubin 1.8 (H) 0.3 - 0.9 mg/dL    Comment: Performed at [facility] [place] [address] [place], [place] [address]  Comprehensive metabolic panel     Status: Abnormal   Collection Time: 03/09/21  3:30 AM  Result Value Ref Range   Sodium 135 135 - 145 mmol/L   Potassium 3.5 3.5  - 5.1 mmol/L   Chloride 99 98 - 111 mmol/L   CO2 23 22 - 32 mmol/L   Glucose, Bld 70 70 - 99 mg/dL    Comment: Glucose reference range applies only to samples taken after fasting for at least 8 hours.   BUN 17 8 - 23 mg/dL   Creatinine, Ser 0.81 0.44 - 1.00 mg/dL   Calcium 8.6 (L) 8.9 - 10.3 mg/dL   Total Protein 6.5 6.5 - 8.1 g/dL   Albumin 4.0 3.5 - 5.0 g/dL   AST 22 15 - 41 U/L   ALT 14 0 - 44 U/L   Alkaline Phosphatase 46 38 - 126 U/L   Total Bilirubin 2.6 (H) 0.3 - 1.2 mg/dL   GFR, Estimated >60 >60 mL/min    Comment: (NOTE) Calculated using the CKD-EPI Creatinine Equation (2021)    Anion gap 13 5 - 15    Comment: Performed at [facility], [place] [address] [place],  [address]  CBC     Status: Abnormal   Collection Time: 03/09/21  3:30 AM  Result Value Ref Range   WBC 9.6 4.0 - 10.5  K/uL   RBC 5.28 (H) 3.87 - 5.11 MIL/uL   Hemoglobin 14.5 12.0 - 15.0 g/dL   HCT 43.8 36.0 - 46.0 %   MCV 83.0 80.0 - 100.0 fL   MCH 27.5 26.0 - 34.0 pg   MCHC 33.1 30.0 - 36.0 g/dL   RDW 14.9 11.5 - 15.5 %   Platelets 229 150 - 400 K/uL   nRBC 0.0 0.0 - 0.2 %    Comment: Performed at [facility], [place] [address], [place], [place] [address]    Current Facility-Administered Medications  Medication Dose Route Frequency Provider Last Rate Last Admin   0.9 %  sodium chloride infusion   Intravenous PRN Debbe Odea, MD   Stopped at 03/10/21 0000   acetaminophen (TYLENOL) tablet 650 mg  650 mg Oral Q6H PRN Cherylann Ratel A, DO   650 mg at 03/10/21 1034   Or   acetaminophen (TYLENOL) suppository 650 mg  650 mg Rectal Q6H PRN Marylyn Ishihara, Tyrone A, DO       ALPRAZolam Duanne Moron) tablet 0.5 mg  0.5 mg Oral TID PRN Debbe Odea, MD       cefTRIAXone (ROCEPHIN) 1 g in sodium chloride 0.9 % 100 mL IVPB  1 g Intravenous Q24H Marylyn Ishihara, Tyrone A, DO   Stopped at 03/09/21 1747   multivitamin with minerals tablet 1 tablet  1 tablet Oral Daily Debbe Odea, MD   1 tablet at  03/10/21 1000   ondansetron (ZOFRAN) tablet 4 mg  4 mg Oral Q6H PRN Cherylann Ratel A, DO       Or   ondansetron (ZOFRAN) injection 4 mg  4 mg Intravenous Q6H PRN Marylyn Ishihara, Tyrone A, DO       vitamin B-12 (CYANOCOBALAMIN) tablet 500 mcg  500 mcg Oral Daily Debbe Odea, MD   500 mcg at 03/10/21 1000    Musculoskeletal: Strength & Muscle Tone: decreased Gait & Station: unsteady Patient leans: N/A            Psychiatric Specialty Exam:  Presentation  General Appearance: Appropriate for Environment; Casual  Eye Contact:Good  Speech:Clear and Coherent; Normal Rate  Speech Volume:Normal  Handedness:Right   Mood and Affect  Mood:Euthymic  Affect:Appropriate; Congruent   Thought Process  Thought Processes:Irrevelant  Descriptions of Associations:Loose  Orientation:Partial  Thought Content:Logical  History of Schizophrenia/Schizoaffective disorder:No data recorded Duration of Psychotic Symptoms:No data recorded Hallucinations:Hallucinations: None  Ideas of Reference:None  Suicidal Thoughts:Suicidal Thoughts: No  Homicidal Thoughts:Homicidal Thoughts: No   Sensorium  Memory:Immediate Fair; Recent Poor; Remote Poor  Judgment:Poor  Insight:Lacking   Executive Functions  Concentration:Fair  Attention Span:Fair  Recall:Poor  Fund of Knowledge:Poor  Language:Fair   Psychomotor Activity  Psychomotor Activity:Psychomotor Activity: Normal   Assets  Assets:Communication Skills; Desire for Improvement; Financial Resources/Insurance; Physical Health; Leisure Time; Social Support   Sleep  Sleep:No data recorded  Physical Exam: Physical Exam ROS Blood pressure (!) 146/88, pulse (!) 56, temperature 98.4 F (36.9 C), temperature source Oral, resp. rate 16, height 5\' 8"  (1.727 m), weight 54.4 kg, SpO2 96 %. Body mass index is 18.25 kg/m.  Treatment Plan Summary: Daily contact with patient to assess and evaluate symptoms and progress in treatment,  Medication management, and Plan   -Psychiatry will continue to follow daily. -last EKG obtained December 21, QTC of 582. Patient with multiple risk factors for QTc prolongation to include cardiac abnormalities, female, age >69 and electrolyte abnormalities.  Psychotropic medication will likely cause worsening QTC prolongation.  Recently ordered EKG yesterday,  and updated to stat this morning.  EKG has not been obtained at the time of this chart documentation.  Once repeat EKG is obtained, if QTc has improve less than 500, may start zyprexa 2.5mg  po qhs.  We will recommend daily EKGs if psychotropic medication is started to target hallucinations, psychosis, and confusion. -Will recommend consulting neurology during this hospitalization.  Patient with history of Mild cortical atrophy, reduced memory, poor gait, difficulty swallowing, and now new onset of confusion. -According to PDMP patient's prescribed oxycodone 10 mg p.o. 3 times daily every 10 days, alprazolam 1 mg p.o. 3 times daily.  Prescriptions were last filled December 15 and December 5 consecutively.  On admission patient's urine drug screen was positive for benzodiazepines only.  Will order 9drug profile to screen for opiates since this medication has been held during this admission.  She is currently not exhibiting any acute withdrawal symptoms at this time.  We will continue to monitor daily for close observation. -Patient is at risk for developing delirium, recommend initiating delirium precautions to include safety sitter at night. Disposition: No evidence of imminent risk to self or others at present.   Patient does not meet criteria for psychiatric inpatient admission. Supportive therapy provided about ongoing stressors.  Suella Broad, FNP 03/10/2021 12:23 PM

## 2021-03-10 NOTE — Progress Notes (Signed)
Transition of Care [facility]) Screening Note  Patient Details  Name: [name] Date of Birth: 1955/12/31  Transition of Care [facility]) CM/SW Contact:    Sherie Don, LCSW Phone Number: 03/10/2021, 9:42 AM  Transition of Care Department [facility]) has reviewed patient and no TOC needs have been identified at this time. We will continue to monitor patient advancement through interdisciplinary progression rounds. If new patient transition needs arise, please place a TOC consult.

## 2021-03-10 NOTE — Evaluation (Addendum)
Physical Therapy Evaluation Patient Details Name: [name] MRN: [id number] DOB: 01-Nov-1955 Today's Date: 03/10/2021  History of Present Illness  65 y/o female with anxiety/depression, chronic leg pain, insomnia who presented after being found on the floor confused by her daughter. She was last noted to be her norm 2 days before. In the ED, she complained of dysuria and UA was suggestive of an infection. Dx of metabolic encephalopathy.  Clinical Impression  Pt admitted with above diagnosis. Pt ambulated 15' with hand held assist of 1, distance limited by fatigue and dizziness. Pt is oriented to self, location, and month, but not to year. She reported her daughter is available to assist 24/7 if needed upon DC. Pt currently with functional limitations due to the deficits listed below (see PT Problem List). Pt will benefit from skilled PT to increase their independence and safety with mobility to allow discharge to the venue listed below.          Recommendations for follow up therapy are one component of a multi-disciplinary discharge planning process, led by the attending physician.  Recommendations may be updated based on patient status, additional functional criteria and insurance authorization.  Follow Up Recommendations Home health PT    Assistance Recommended at Discharge Intermittent Supervision/Assistance  Functional Status Assessment Patient has had a recent decline in their functional status and demonstrates the ability to make significant improvements in function in a reasonable and predictable amount of time.  Equipment Recommendations  None recommended by PT    Recommendations for Other Services       Precautions / Restrictions Precautions Precautions: Fall Restrictions Weight Bearing Restrictions: No      Mobility  Bed Mobility Overal bed mobility: Modified Independent             General bed mobility comments: HOB up, used rail    Transfers Overall  transfer level: Needs assistance Equipment used: 1 person hand held assist Transfers: Sit to/from Stand Sit to Stand: Min assist           General transfer comment: min A to power up/steady    Ambulation/Gait Ambulation/Gait assistance: Min assist Gait Distance (Feet): 18 Feet Assistive device: 1 person hand held assist Gait Pattern/deviations: Step-through pattern;Decreased stride length;Narrow base of support Gait velocity: decr     General Gait Details: min A for balance, mildly unsteady, distance limited by fatigue/dizziness  Stairs            Wheelchair Mobility    Modified Rankin (Stroke Patients Only)       Balance Overall balance assessment: Needs assistance Sitting-balance support: Feet supported;No upper extremity supported Sitting balance-Leahy Scale: Fair     Standing balance support: Single extremity supported Standing balance-Leahy Scale: Poor Standing balance comment: single UE support for static and dynamic                             Pertinent Vitals/Pain Pain Assessment: 0-10 Pain Location: BLEs Pain Descriptors / Indicators: Grimacing Pain Intervention(s): Patient requesting pain meds-RN notified;Limited activity within patient's tolerance;Monitored during session    Home Living Family/patient expects to be discharged to:: Unsure Living Arrangements: Alone                 Additional Comments: stated daughter can assist 24/7    Prior Function Prior Level of Function : Independent/Modified Independent             Mobility Comments: pt reports she drives,  gets her own groceries, will need to confirm with family, reports she walks without AD; lives in 2nd floor apartment alone, has a RW but doesn't use it. ADLs Comments: independent     Hand Dominance        Extremity/Trunk Assessment   Upper Extremity Assessment Upper Extremity Assessment: Overall WFL for tasks assessed    Lower Extremity  Assessment Lower Extremity Assessment: Overall WFL for tasks assessed    Cervical / Trunk Assessment Cervical / Trunk Assessment: Normal  Communication      Cognition Arousal/Alertness: Awake/alert Behavior During Therapy: WFL for tasks assessed/performed Overall Cognitive Status: Impaired/Different from baseline                                 General Comments: pt oriented to self,  location, month but not year        General Comments      Exercises     Assessment/Plan    PT Assessment Patient needs continued PT services  PT Problem List Decreased activity tolerance;Decreased balance;Pain;Decreased mobility       PT Treatment Interventions Gait training;Therapeutic activities;Therapeutic exercise;Functional mobility training;Patient/family education    PT Goals (Current goals can be found in the Care Plan section)  Acute Rehab PT Goals PT Goal Formulation: Patient unable to participate in goal setting Time For Goal Achievement: 03/24/21 Potential to Achieve Goals: Good    Frequency Min 3X/week   Barriers to discharge        Co-evaluation               AM-PAC PT "6 Clicks" Mobility  Outcome Measure Help needed turning from your back to your side while in a flat bed without using bedrails?: None Help needed moving from lying on your back to sitting on the side of a flat bed without using bedrails?: A Little Help needed moving to and from a bed to a chair (including a wheelchair)?: A Little Help needed standing up from a chair using your arms (e.g., wheelchair or bedside chair)?: A Little Help needed to walk in hospital room?: A Little Help needed climbing 3-5 steps with a railing? : A Lot 6 Click Score: 18    End of Session Equipment Utilized During Treatment: Gait belt Activity Tolerance: Patient tolerated treatment well;No increased pain Patient left: in chair;with call bell/phone within reach;with chair alarm set Nurse Communication:  Mobility status PT Visit Diagnosis: Difficulty in walking, not elsewhere classified (R26.2);Pain Pain - Right/Left:  (both) Pain - part of body: Leg    Time: 0623-7628 PT Time Calculation (min) (ACUTE ONLY): 11 min   Charges:   PT Evaluation $PT Eval Moderate Complexity: 1 Mod         Philomena Doheny PT 03/10/2021  Acute Rehabilitation Services Pager [phone] Office [phone]

## 2021-03-11 DIAGNOSIS — R410 Disorientation, unspecified: Secondary | ICD-10-CM

## 2021-03-11 DIAGNOSIS — Z950 Presence of cardiac pacemaker: Secondary | ICD-10-CM

## 2021-03-11 DIAGNOSIS — G4700 Insomnia, unspecified: Secondary | ICD-10-CM

## 2021-03-11 DIAGNOSIS — F319 Bipolar disorder, unspecified: Secondary | ICD-10-CM

## 2021-03-11 LAB — RPR: RPR Ser Ql: NONREACTIVE

## 2021-03-11 LAB — CK: Total CK: 139 U/L (ref 38–234)

## 2021-03-11 NOTE — Plan of Care (Signed)
  Problem: Activity: Goal: Risk for activity intolerance will decrease Outcome: Progressing   Problem: Pain Managment: Goal: General experience of comfort will improve Outcome: Progressing   Problem: Safety: Goal: Ability to remain free from injury will improve Outcome: Progressing   

## 2021-03-11 NOTE — TOC Transition Note (Signed)
Transition of Care [facility]) - CM/SW Discharge Note  Patient Details  Name: [name] MRN: [id number] Date of Birth: [birth year]  Transition of Care [facility]) CM/SW Contact:  Sherie Don, LCSW Phone Number: 03/11/2021, 2:00 PM  Clinical Narrative: Patient will need HHPT. Daughter is agreeable to referral. Patient was declined by [facility]. CSW made referral to Cindie with [facility] that was accepted. CSW updated daughter. Patient will need HHPT orders prior to discharge.  Final next level of care: Home w Home Health Services Barriers to Discharge: Continued Medical Work up  Patient Goals and CMS Choice CMS Medicare.gov Compare Post Acute Care list provided to:: Patient Represenative (must comment) Choice offered to / list presented to : Adult Children  Discharge Plan and Services        DME Arranged: N/A DME Agency: NA HH Arranged: PT HH Agency: [place] Date [facility] Agency Contacted: 03/11/21 Representative spoke with at [place]: Cindie  Readmission Risk Interventions No flowsheet data found.

## 2021-03-11 NOTE — Progress Notes (Signed)
PROGRESS NOTE    [name]  TXM:[id number] DOB: [birth year] DOA: 03/08/2021 PCP: Lujean Amel, MD   Brief Narrative:  The patient is a 65 year old extremely thin and cachectic Caucasian female with a past medical history significant for but not limited to anxiety and depression, chronic leg pain, insomnia as well as other comorbidities who was brought into the ED after being found in the floor and confused by her daughter.  The daughter states that her last noted normal was about 2 days ago and in the ED she complained of some dysuria and UA was suggestive of an infection and thus was started on rifaximin.  Initially her pulse ox on room air was 76 but when checked by EMS it was erroneous as she arrived to the ED is 99%.  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 Wynelle Cleveland spoke with the patient's daughter [name] who states the patietn was confused on 12/22 and requested Psych Evaluation -Her QTC was 585 when she was admitted and trended down to 442 ms the next day may be suggestive of an overdose of her home meds and psych evaluated and recommending consulting neurology for altered mental status -Psychiatry recommended not starting any psychotropics until her delirium and encephalopathy improved.  Her home medications include quetiapine 5 mg p.o. nightly risperidone 3 mg nightly Xanax 1 mg p.o. 3 times daily as well as hydroxyzine -We will resume her Seroquel for insomnia and continue to hold  her Risperdal and hydroxyzine -Psychiatry has ordered a UDS and we have also ordered a B12 and RPR; B12 was 2860 and RPR non-reactive -UDS Positive for Benzo's and she is on Alprazolam 0.5 po TIDprn Anxiety  -Urine culture was not really consistent with a UTI so we have stopped the ceftriaxone given that she had very mild growth on her urine culture (10,000 CFU of E Faecalis and 30,000 CFU of Lactobacillus with sensitivities pending)  -PT/OT recommended Home Health PT/OT   Hyperbilirubinemia -Patient's T bili went from 2.1 and trended up to 2.6 -Continue to monitor and trend and repeat CMP in AM  Chronic Pain in the Leg -Her Home Oxycodone has been held as well as her Zanaflex  Vitamin B12 Deficiency  -Vitamin B12 level as above -C/w Vitamin B12 of 500 mcg po Daily   Pacer -Not on Tele -She has a Medtronic dual-chamber pacer that was implanted 02/20/2013  Underweight -Estimated body mass index is 18.25 kg/m as calculated from the following:   Height as of this encounter: 5\' 8"  (1.727 m).   Weight as of this encounter: 54.4 kg. -Dietitian Consulted for further evaluation and recommendations  DVT prophylaxis: SCDs Code Status: FULL CODE  Family Communication: No family present at bedside  Disposition Plan: Pending further clinical improvement and clearance by Neurology   Status is: Inpatient  Remains inpatient appropriate because: Still a little confused  Consultants:  Neurology Psychiatry   Procedures:  None  Antimicrobials: Anti-infectives (From admission, onward)    Start     Dose/Rate Route Frequency  Ordered Stop   03/08/21 1730  cefTRIAXone (ROCEPHIN) 1 g in sodium chloride 0.9 % 100 mL IVPB  Status:  Discontinued        1 g 200 mL/hr over 30 Minutes Intravenous Every 24 hours 03/08/21 1728 03/10/21 1520        Subjective: Seen and examined at bedside and you that the month of December and that we were in the year 2022 and the president was Biden.  When asked  about the date though she states it was January.  Still little confused.  Denies any current complaints and no chest pain or shortness of breath.  No other concerns or complaints at this time.  Objective: Vitals:   03/10/21 1402 03/10/21 2148 03/11/21 0518 03/11/21 1453  BP: 139/88 (!) 161/89 (!) 143/80 (!) 158/85  Pulse: 66 (!) 58 (!) 57 (!) 56  Resp: 18 17 16 16   Temp: 98.5 F (36.9 C) 98.3 F (36.8 C) 97.7 F (36.5 C) 98.6 F (37 C)  TempSrc:  Oral Oral   SpO2: 100% 98% 98% 99%  Weight:      Height:        Intake/Output Summary (Last 24 hours) at 03/11/2021 1527 Last data filed at 03/11/2021 0518 Gross per 24 hour  Intake 200 ml  Output 250 ml  Net -50 ml   Filed Weights   03/08/21 1149  Weight: 54.4 kg   Examination: Physical Exam:  Constitutional: Thin cachectic chronically ill-appearing Caucasian female currently in no acute distress appears confused still Eyes: Lids and conjunctivae normal, sclerae anicteric  ENMT: External Ears, Nose appear normal. Grossly normal hearing.  Neck: Appears normal, supple, no cervical masses, normal ROM, no appreciable thyromegaly; no appreciable JVD Respiratory: Diminished to auscultation bilaterally, no wheezing, rales, rhonchi or crackles. Normal respiratory effort and patient is not tachypenic. No accessory muscle use. Unlabored breathing Cardiovascular: RRR, no murmurs / rubs / gallops. S1 and S2 auscultated.  Abdomen: Soft, non-tender, non-distended. Bowel sounds positive.  GU: Deferred. Musculoskeletal: No clubbing / cyanosis of digits/nails. No joint deformity upper and lower extremities.  Skin: No rashes, lesions, ulcers on limited skin evaluation. No induration; Warm and dry.  Neurologic: CN 2-12 grossly intact with no focal deficits.  Romberg sign and cerebellar reflexes not assessed.  Psychiatric: Normal judgment and insight.  Still a Little confused and alert and oriented x2  Data Reviewed: I have personally reviewed  following labs and imaging studies  CBC: Recent Labs  Lab 03/08/21 1308 03/09/21 0330  WBC 9.9 9.6  NEUTROABS 7.6  --   HGB 15.9* 14.5  HCT 48.4* 43.8  MCV 83.2 83.0  PLT 244 403   Basic Metabolic Panel: Recent Labs  Lab 03/08/21 1308 03/08/21 1729 03/09/21 0330  NA 137  --  135  K 3.2*  --  3.5  CL 99  --  99  CO2 25  --  23  GLUCOSE 105*  --  70  BUN 12  --  17  CREATININE 0.77  --  0.81  CALCIUM 9.2  --  8.6*  MG 2.1 2.1  --   PHOS 3.8  --   --    GFR: Estimated Creatinine Clearance: 61.1 mL/min (by C-G formula based on SCr of 0.81 mg/dL). Liver Function Tests: Recent Labs  Lab 03/08/21 1308 03/08/21 1729 03/09/21 0330  AST 39  --  22  ALT 18  --  14  ALKPHOS 56  --  46  BILITOT 2.8* 2.1* 2.6*  PROT  7.5  --  6.5  ALBUMIN 4.8  --  4.0   Recent Labs  Lab 03/08/21 1308  LIPASE 29   Recent Labs  Lab 03/08/21 1309  AMMONIA 15   Coagulation Profile: Recent Labs  Lab 03/08/21 1308  INR 1.0   Cardiac Enzymes: Recent Labs  Lab 03/11/21 1049  CKTOTAL 139   BNP (last 3 results) No results for input(s): PROBNP in the last 8760 hours. HbA1C: No results for input(s): HGBA1C in the last 72 hours. CBG: No results for input(s): GLUCAP in the last 168 hours. Lipid Profile: No results for input(s): CHOL, HDL, LDLCALC, TRIG, CHOLHDL, LDLDIRECT in the last 72 hours. Thyroid Function Tests: No results for input(s): TSH, T4TOTAL, FREET4, T3FREE, THYROIDAB in the last 72 hours. Anemia Panel: Recent Labs    03/10/21 1653  VITAMINB12 2,860*   Sepsis Labs: Recent Labs  Lab 03/08/21 1308 03/08/21 1454  LATICACIDVEN 2.7* 1.2    Recent Results (from the past 240 hour(s))  Resp Panel by RT-PCR (Flu A&B, Covid) Nasopharyngeal Swab     Status: None   Collection Time: 03/08/21  1:22 PM   Specimen: Nasopharyngeal Swab; Nasopharyngeal(NP) swabs in vial transport medium  Result Value Ref Range Status   SARS Coronavirus 2 by RT PCR NEGATIVE NEGATIVE Final     Comment: (NOTE) SARS-CoV-2 target nucleic acids are NOT DETECTED.  The SARS-CoV-2 RNA is generally detectable in upper respiratory specimens during the acute phase of infection. The lowest concentration of SARS-CoV-2 viral copies this assay can detect is 138 copies/mL. A negative result does not preclude SARS-Cov-2 infection and should not be used as the sole basis for treatment or other patient management decisions. A negative result may occur with  improper specimen collection/handling, submission of specimen other than nasopharyngeal swab, presence of viral mutation(s) within the areas targeted by this assay, and inadequate number of viral copies(<138 copies/mL). A negative result must be combined with clinical observations, patient history, and epidemiological information. The expected result is Negative.  Fact Sheet for Patients:  EntrepreneurPulse.com.au  Fact Sheet for Healthcare Providers:  IncredibleEmployment.be  This test is no t yet approved or cleared by the [place] FDA and  has been authorized for detection and/or diagnosis of SARS-CoV-2 by FDA under an Emergency Use Authorization (EUA). This EUA will remain  in effect (meaning this test can be used) for the duration of the COVID-19 declaration under Section 564(b)(1) of the Act, 21 U.S.C.section 360bbb-3(b)(1), unless the authorization is terminated  or revoked sooner.       Influenza A by PCR NEGATIVE NEGATIVE Final   Influenza B by PCR NEGATIVE NEGATIVE Final    Comment: (NOTE) The Xpert Xpress SARS-CoV-2/FLU/RSV plus assay is intended as an aid in the diagnosis of influenza from Nasopharyngeal swab specimens and should not be used as a sole basis for treatment. Nasal washings and aspirates are unacceptable for Xpert Xpress SARS-CoV-2/FLU/RSV testing.  Fact Sheet for Patients: EntrepreneurPulse.com.au  Fact Sheet for Healthcare  Providers: IncredibleEmployment.be  This test is not yet approved or cleared by the [place] FDA and has been authorized for detection and/or diagnosis of SARS-CoV-2 by FDA under an Emergency Use Authorization (EUA). This EUA will remain in effect (meaning this test can be used) for the duration of the COVID-19 declaration under Section 564(b)(1) of the Act, 21 U.S.C. section 360bbb-3(b)(1), unless the authorization is terminated or revoked.  Performed at [facility] [place], [place] [address] [place], [place] [address]   Urine Culture  Status: Abnormal (Preliminary result)   Collection Time: 03/08/21  2:21 PM   Specimen: In/Out Cath Urine  Result Value Ref Range Status   Specimen Description   Final    IN/OUT CATH URINE Performed at [place] [address], [place], [place] [address]    Special Requests   Final    NONE Performed at [facility], [place] [address] [place], [place] [address]    Culture (A)  Final    10,000 COLONIES/mL ENTEROCOCCUS FAECALIS SUSCEPTIBILITIES TO FOLLOW 30,000 COLONIES/mL LACTOBACILLUS SPECIES Standardized susceptibility testing for this organism is not available. Performed at [facility], [place] [address] [place], [place] [address]    Report Status PENDING  Incomplete    RN Pressure Injury Documentation:     Estimated body mass index is 18.25 kg/m as calculated from the following:   Height as of this encounter: 5\' 8"  (1.727 m).   Weight as of this encounter: 54.4 kg.  Malnutrition Type:   Malnutrition Characteristics:   Nutrition Interventions:    Radiology Studies: No results found.  Scheduled Meds:  multivitamin with minerals  1 tablet Oral Daily   vitamin B-12  500 mcg Oral Daily   Continuous Infusions:  sodium chloride Stopped (03/10/21 0000)    LOS: 2 days   Kerney Elbe, DO [facility] PAGER is on AMION  If 7PM-7AM,  please contact night-coverage [email or web address]

## 2021-03-11 NOTE — Plan of Care (Signed)
°  Problem: Health Behavior/Discharge Planning: Goal: Ability to manage health-related needs will improve Outcome: Progressing   Problem: Clinical Measurements: Goal: Ability to maintain clinical measurements within normal limits will improve Outcome: Progressing Goal: Will remain free from infection Outcome: Progressing Goal: Diagnostic test results will improve Outcome: Progressing Goal: Cardiovascular complication will be avoided Outcome: Progressing   Problem: Activity: Goal: Risk for activity intolerance will decrease Outcome: Progressing   Problem: Nutrition: Goal: Adequate nutrition will be maintained Outcome: Progressing   Problem: Coping: Goal: Level of anxiety will decrease Outcome: Progressing   Problem: Elimination: Goal: Will not experience complications related to bowel motility Outcome: Progressing   Problem: Pain Managment: Goal: General experience of comfort will improve Outcome: Progressing   Problem: Safety: Goal: Ability to remain free from injury will improve Outcome: Progressing

## 2021-03-12 DIAGNOSIS — G4700 Insomnia, unspecified: Secondary | ICD-10-CM | POA: Diagnosis not present

## 2021-03-12 DIAGNOSIS — G9341 Metabolic encephalopathy: Secondary | ICD-10-CM | POA: Diagnosis not present

## 2021-03-12 DIAGNOSIS — E876 Hypokalemia: Secondary | ICD-10-CM

## 2021-03-12 DIAGNOSIS — F319 Bipolar disorder, unspecified: Secondary | ICD-10-CM | POA: Diagnosis not present

## 2021-03-12 DIAGNOSIS — R410 Disorientation, unspecified: Secondary | ICD-10-CM | POA: Diagnosis not present

## 2021-03-12 LAB — CBC WITH DIFFERENTIAL/PLATELET
Abs Immature Granulocytes: 0.02 10*3/uL (ref 0.00–0.07)
Basophils Absolute: 0.1 10*3/uL (ref 0.0–0.1)
Basophils Relative: 1 %
Eosinophils Absolute: 0.1 10*3/uL (ref 0.0–0.5)
Eosinophils Relative: 1 %
HCT: 43.4 % (ref 36.0–46.0)
Hemoglobin: 14.5 g/dL (ref 12.0–15.0)
Immature Granulocytes: 0 %
Lymphocytes Relative: 31 %
Lymphs Abs: 2.8 10*3/uL (ref 0.7–4.0)
MCH: 27.1 pg (ref 26.0–34.0)
MCHC: 33.4 g/dL (ref 30.0–36.0)
MCV: 81.1 fL (ref 80.0–100.0)
Monocytes Absolute: 0.8 10*3/uL (ref 0.1–1.0)
Monocytes Relative: 9 %
Neutro Abs: 5.3 10*3/uL (ref 1.7–7.7)
Neutrophils Relative %: 58 %
Platelets: 241 10*3/uL (ref 150–400)
RBC: 5.35 MIL/uL — ABNORMAL HIGH (ref 3.87–5.11)
RDW: 14.4 % (ref 11.5–15.5)
WBC: 9 10*3/uL (ref 4.0–10.5)
nRBC: 0 % (ref 0.0–0.2)

## 2021-03-12 LAB — COMPREHENSIVE METABOLIC PANEL
ALT: 12 U/L (ref 0–44)
AST: 13 U/L — ABNORMAL LOW (ref 15–41)
Albumin: 3.9 g/dL (ref 3.5–5.0)
Alkaline Phosphatase: 43 U/L (ref 38–126)
Anion gap: 7 (ref 5–15)
BUN: 12 mg/dL (ref 8–23)
CO2: 29 mmol/L (ref 22–32)
Calcium: 8.9 mg/dL (ref 8.9–10.3)
Chloride: 101 mmol/L (ref 98–111)
Creatinine, Ser: 0.57 mg/dL (ref 0.44–1.00)
GFR, Estimated: >60 mL/min (ref >60)
Glucose, Bld: 111 mg/dL — ABNORMAL HIGH (ref 70–99)
Potassium: 3 mmol/L — ABNORMAL LOW (ref 3.5–5.1)
Sodium: 137 mmol/L (ref 135–145)
Total Bilirubin: 1.4 mg/dL — ABNORMAL HIGH (ref 0.3–1.2)
Total Protein: 6 g/dL — ABNORMAL LOW (ref 6.5–8.1)

## 2021-03-12 LAB — TSH: TSH: 2.495 u[IU]/mL (ref 0.350–4.500)

## 2021-03-12 LAB — URINE CULTURE: Culture: 10000 — AB

## 2021-03-12 LAB — PHOSPHORUS: Phosphorus: 3.2 mg/dL (ref 2.5–4.6)

## 2021-03-12 LAB — MAGNESIUM: Magnesium: 2 mg/dL (ref 1.7–2.4)

## 2021-03-12 MED ORDER — ALPRAZOLAM 1 MG PO TABS
0.5000 mg | ORAL_TABLET | Freq: Three times a day (TID) | ORAL | 3 refills | Status: DC | PRN
Start: 2021-03-12 — End: 2021-05-30

## 2021-03-12 MED ORDER — OXYCODONE HCL 10 MG PO TABS: 5.0000 mg | ORAL_TABLET | Freq: Three times a day (TID) | ORAL | 0 refills | Status: DC | PRN

## 2021-03-12 MED ORDER — POTASSIUM CHLORIDE 10 MEQ/100ML IV SOLN
10.0000 meq | INTRAVENOUS | Status: AC
  Administered 2021-03-12 (×3): 10 meq via INTRAVENOUS
  Filled 2021-03-12 (×3): qty 100

## 2021-03-12 MED ORDER — POTASSIUM CHLORIDE CRYS ER 20 MEQ PO TBCR
40.0000 meq | EXTENDED_RELEASE_TABLET | Freq: Two times a day (BID) | ORAL | Status: DC
  Administered 2021-03-12: 10:00:00 40 meq via ORAL
  Filled 2021-03-12: qty 2

## 2021-03-12 MED ORDER — ADULT MULTIVITAMIN W/MINERALS CH: 1.0000 | ORAL_TABLET | Freq: Every day | ORAL | Status: AC

## 2021-03-12 NOTE — Plan of Care (Signed)
  Problem: Activity: Goal: Risk for activity intolerance will decrease Outcome: Progressing   Problem: Pain Managment: Goal: General experience of comfort will improve Outcome: Progressing   Problem: Safety: Goal: Ability to remain free from injury will improve Outcome: Progressing   

## 2021-03-12 NOTE — Consult Note (Signed)
Neurology Consult H&P  WYLMA TATEM MR# [id number] 03/12/2021   CC: altered mental status  History is obtained from: Patient and chart.  HPI: SAVAYAH WALTRIP is a 65 y.o. female PMHx as reviewed below with acute metabolic encephalopathy. Her daughter found her on the floor and confused. She last saw her mother 2 days before. In the ED she was found to have dysuria and UA was suggestive of infection and thus she was treated with Ceftriaxone.  The following information was taken from Dr. Weston Settle note 03/11/2021: "Brief Narrative:  The patient is a 65 year old extremely thin and cachectic Caucasian female with a past medical history significant for but not limited to anxiety and depression, chronic leg pain, insomnia as well as other comorbidities who was brought into the ED after being found in the floor and confused by her daughter.  The daughter states that her last noted normal was about 2 days ago and in the ED she complained of some dysuria and UA was suggestive of an infection and thus was started on rifaximin.  Initially her pulse ox on room air was 76 but when checked by EMS it was erroneous as she arrived to the ED is 99%.  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 Wynelle Cleveland spoke with the patient's daughter [name] who states the patietn was confused on 12/22 and requested Psych Evaluation -Her QTC was 585 when she was admitted and trended down to 442 ms the next day may be suggestive of an overdose of her home meds and psych evaluated and  recommending consulting neurology for altered mental status -Psychiatry recommended not starting any psychotropics until her delirium and encephalopathy improved.  Her home medications include quetiapine 5 mg p.o. nightly risperidone 3 mg nightly Xanax 1 mg p.o. 3 times daily as well as hydroxyzine -We will resume her Seroquel for insomnia and continue to hold her Risperdal and hydroxyzine -Psychiatry has ordered a UDS and we have also ordered a B12 and RPR; B12 was 2860 and RPR non-reactive -UDS Positive for Benzo's and she is on Alprazolam 0.5 po TIDprn Anxiety  -Urine culture was not really consistent with a UTI so we have stopped the ceftriaxone given that she had very mild growth on her urine culture (10,000 CFU of E Faecalis and 30,000 CFU of Lactobacillus with sensitivities pending)  -PT/OT recommended Home Health PT/OT "   LKW: unclear tNK given: Not a candidate IR Thrombectomy No, clear deficits. Modified Rankin Scale: 0-Completely asymptomatic and back to baseline post- stroke NIHSS: 0  ROS: A complete ROS was performed and is negative except as noted in the HPI.  Past Medical History:  Diagnosis Date   Anxiety    anxiety and mild depressive order systoms   Bipolar disorder ([place])     (02/20/2013)   Cervical cancer ([place]) 03/19/1980   Depression    Dysrhythmia    Headache(784.0)    "monthly" (02/20/2013)   Pacemaker    medtronic   PONV (postoperative nausea and vomiting)    Sinus pause 02/14/2013   Syncope and collapse  echo-April 12,2012-EF 55% Echo normal; recorder with prolonged sinus pauses --> status post   Tobacco abuse    Vertigo      Family History  Adopted: Yes  Problem Relation Age of Onset   Heart attack Brother     Social History:  reports that she has been smoking cigarettes. She has a 30.00 pack-year smoking history. She has never used smokeless tobacco. She reports that she does not drink alcohol and does not use drugs.   Prior to Admission  medications   Medication Sig Start Date End Date Taking? Authorizing Provider  acetaminophen (TYLENOL) 325 MG tablet Take 2 tablets (650 mg total) by mouth every 6 (six) hours as needed for mild pain (or Fever >/= 101). 02/26/20  Yes Domenic Polite, MD  [facility] B-12 500 MCG tablet Take 500 mcg by mouth daily. 02/13/21  Yes [provider]  QUEtiapine (SEROQUEL) 25 MG tablet Take 1 tablet (25 mg total) by mouth at bedtime. 01/26/21  Yes Donnal Moat T, PA-C  risperiDONE (RISPERDAL) 3 MG tablet Take 1 tablet (3 mg total) by mouth at bedtime. 01/26/21  Yes Hurst, Helene Kelp T, PA-C  tiZANidine (ZANAFLEX) 4 MG tablet Take 4 mg by mouth 3 (three) times daily as needed for pain. 01/04/21  Yes [provider]  ALPRAZolam Duanne Moron) 1 MG tablet Take 0.5 tablets (0.5 mg total) by mouth 3 (three) times daily as needed for anxiety. 03/12/21   Raiford Noble Latif, DO  hydrOXYzine (ATARAX/VISTARIL) 50 MG tablet Take 1-2 tablets (50-100 mg total) by mouth at bedtime as needed. 01/26/21   Addison Lank, PA-C  Multiple Vitamin (MULTIVITAMIN WITH MINERALS) TABS tablet Take 1 tablet by mouth daily. 03/12/21 04/11/21  Raiford Noble Latif, DO  Oxycodone HCl 10 MG TABS Take 0.5 tablets (5 mg total) by mouth 3 (three) times daily as needed (pain). 03/12/21   Kerney Elbe, DO    Exam: Current vital signs: BP 126/79 (BP Location: Left Arm)    Pulse 68    Temp 98.5 F (36.9 C) (Oral)    Resp 16    Ht 5\' 8"  (1.727 m)    Wt 54.4 kg    SpO2 98%    BMI 18.25 kg/m   Physical Exam  Constitutional: Appears well-developed and well-nourished.  Psych: Affect appropriate to situation Eyes: No scleral injection HENT: No OP obstruction. Head: Normocephalic.  Cardiovascular: Normal rate and regular rhythm.  Respiratory: Effort normal, symmetric excursions bilaterally, no audible wheezing. GI: Soft.  No distension. There is no tenderness.  Skin: WDI  Neuro: Mental Status: Patient is awake, alert, oriented  to person, place, month, year, and situation. She was able to correctly state the number of quarters in $2.75. Speech is fluent, intact comprehension and repetition. No signs of aphasia or neglect. Visual Fields are full. Pupils are equal, round, and reactive to light. EOMI without ptosis or diploplia.  Facial sensation is symmetric to temperature Facial movement is symmetric.  Hearing is intact to voice. Uvula midline and palate elevates symmetrically. Shoulder shrug is symmetric. Tongue is midline without atrophy or fasciculations.  Tone is normal. Bulk is normal. 5/5 strength was present in all four extremities. Sensation is symmetric to light touch and temperature in the arms and legs. Deep Tendon Reflexes: 2+ and symmetric in the biceps and patellae. Toes are downgoing bilaterally. FNF and HKS are intact bilaterally. Gait - Deferred  I have reviewed labs in epic and the pertinent results are: K 3.0  I have reviewed  the images obtained: NCT head showed stable unremarkable noncontrast head CT.  Assessment: ZOHAL RENY is a 65 y.o. female PMHx as noted above with acute onset metabolic encephalopathy which has resolved. On exam, she seems cognitively intact and she feels like she is back to her baseline.  Plan: No recommendations.  Electronically signed by:  Lynnae Sandhoff, MD Page: [phone] 03/12/2021, 4:07 PM

## 2021-03-12 NOTE — Discharge Summary (Signed)
Physician Discharge Summary  [name] QIW:[id number] DOB: Aug 01, 1955 DOA: 03/08/2021  PCP: Lujean Amel, MD  Admit date: 03/08/2021 Discharge date: 03/12/2021  Admitted From: Home Disposition: Home with Home Health PT/OT  Recommendations for Outpatient Follow-up:  Follow up with PCP in 1-2 weeks Follow-up with psychiatry within 1 to 2 weeks Please obtain CMP/CBC, Mag, Phos in one week Please follow up on the following pending results:  Home Health: No  Equipment/Devices: None    Discharge Condition: Stable  CODE STATUS: FULL CODE Diet recommendation: Regular diet  Brief/Interim Summary: The patient is a 65 year old extremely thin and cachectic Caucasian female with a past medical history significant for but not limited to anxiety and depression, chronic leg pain, insomnia as well as other comorbidities who was brought into the ED after being found in the floor and confused by her daughter.  The daughter states that her last noted normal was about 2 days ago and in the ED she complained of some dysuria and UA was suggestive of an infection and thus was started on rifaximin.  Initially her pulse ox on room air was 76 but when checked by EMS it was erroneous as she arrived to the ED is 99%.  She continued to remain confused and it was felt that she had taken too much of her psych medications.  Psychiatry and neurology were consulted and she steadily improved and came back to her baseline.  She underwent further work-up and neurology and psychiatry had no further recommendations and psychiatry recommended holding her quetiapine and restarting her back on her risperidone and keeping her on the half dose of the alprazolam.  We also cut her pain medication to have dosing and stop her Zanaflex.  She was improved and he medically stable to be discharged after PT OT evaluated her.  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 Wynelle Cleveland spoke with the patient's daughter [name] who states the patietn was confused on 12/22 and requested Psych Evaluation -Her QTC was 585 when she was admitted and trended down to 442 ms the next day may be suggestive of an overdose of her home meds and psych evaluated and recommending consulting neurology for altered mental status -Psychiatry recommended not starting any psychotropics until her delirium and encephalopathy improved.  Her home medications include quetiapine 5 mg p.o. nightly risperidone 3 mg nightly Xanax 1 mg p.o. 3 times daily as well as hydroxyzine.  Psychiatry recommends stopping the quetiapine and keeping the Xanax half a milligram p.o. 3 times daily as needed -We will resume her Seroquel for insomnia and continue to hold her Risperdal and hydroxyzine but psychiatry has recommended holding her Seroquel now and resuming Risperdal and hydroxyzine at discharge -Psychiatry has ordered a UDS and we have also ordered a B12 and RPR; B12 was 2860 and RPR non-reactive -UDS Positive for Benzo's and she is on Alprazolam 0.5 po TIDprn Anxiety  -Neurology evaluated and felt that she had an acute onset of metabolic encephalopathy  which is now resolved and she is currently to tach and feels back to her baseline so they have no other recommendations -Urine culture was not really consistent with a UTI so we have stopped the ceftriaxone given that she had very mild growth on her urine culture (10,000  CFU of E Faecalis and 30,000 CFU of Lactobacillus with sensitivities pending)  -PT/OT recommended Home Health PT/OT    Hyperbilirubinemia -Patient's T bili went from 2.1 and trended up to 2.6 and is now trending back down to 1.4 -Continue to monitor and trend and repeat CMP within 1 week   Chronic Pain in the Leg -Her Home Oxycodone has been held as well as her Zanaflex -We will stop her Zanaflex and resume oxycodone at half the dose of 5 mg 3 times daily as needed  Hypokalemia -Patient's potassium was 3.0 this morning and replete with p.o. potassium chloride 40 mill colons twice daily x2 doses as well as IV potassium chloride 40 mEq x 1 -Continue monitor and replete as necessary -Repeat CMP within 1 week   Vitamin B12 Deficiency  -Vitamin B12 level as above -C/w Vitamin B12 of 500 mcg po Daily    Pacer -Not on Tele -She has a Medtronic dual-chamber pacer that was implanted 02/20/2013   Underweight -Estimated body mass index is 18.25 kg/m as calculated from the following:   Height as of this encounter: 5\' 8"  (1.727 m).   Weight as of this encounter: 54.4 kg. -Dietitian Consulted for further evaluation and recommendations and can follow-up with the patient outpatient setting    Discharge Instructions  Discharge Instructions     Call MD for:  difficulty breathing, headache or visual disturbances   Complete by: As directed    Call MD for:  extreme fatigue   Complete by: As directed    Call MD for:  hives   Complete by: As directed    Call MD for:  persistant dizziness or light-headedness   Complete by: As directed    Call MD for:  persistant nausea and vomiting   Complete by: As directed    Call MD for:  redness, tenderness, or signs of infection (pain, swelling, redness, odor or green/yellow discharge around incision site)   Complete by: As directed    Call MD for:  severe uncontrolled pain   Complete by: As directed    Call MD for:  temperature >100.4   Complete by: As  directed    Diet - low sodium heart healthy   Complete by: As directed    Discharge instructions   Complete by: As directed    You were cared for by a hospitalist during your hospital stay. If you have any questions about your discharge medications or the care you received while you were in the hospital after you are discharged, you can call the unit and ask to speak with the hospitalist on call if the hospitalist that took care of you is not available. Once you are discharged, your primary care physician will handle any further medical issues. Please note that NO REFILLS for any discharge medications will be authorized once you are discharged, as it is imperative that you return to your primary care physician (or establish a relationship with a primary care physician if you do not have one) for your aftercare needs so that they can reassess your need for medications and monitor your lab values.  Follow up with PCP and Psychiatry in the outpatient setting. Take all medications as prescribed.  If symptoms change or worsen please return to the ED for evaluation   Increase activity slowly   Complete by: As directed       Allergies as of 03/12/2021       Reactions   Antibiotic Ear [neomycin-polymyxin-hc]    Other reaction(s): Unknown   Penicillins Other (See Comments)   Yeast infection Did it involve swelling of the face/tongue/throat, SOB, or low BP? No Did it involve sudden or severe rash/hives, skin peeling, or any reaction on the inside of your mouth or nose? No Did you need to seek medical attention at a hospital or doctor's office? Yes When did it last happen? More than 5 years ago  If all above answers are "NO", may proceed with cephalosporin use.        Medication List     STOP taking these medications    QUEtiapine 25 MG tablet Commonly known as: SEROQUEL   tiZANidine 4 MG tablet Commonly known as: ZANAFLEX       TAKE these medications    acetaminophen 325 MG  tablet Commonly known as: TYLENOL Take 2 tablets (650 mg total) by mouth every 6 (six) hours as needed for mild pain (or Fever >/= 101).   ALPRAZolam 1 MG tablet Commonly known as: Xanax Take 0.5 tablets (0.5 mg total) by mouth 3 (three) times daily as needed for anxiety. What changed: how much to take   [employer] B-12 500 MCG tablet Generic drug: vitamin B-12 Take 500 mcg by mouth daily.   hydrOXYzine 50 MG tablet Commonly known as: ATARAX Take 1-2 tablets (50-100 mg total) by mouth at bedtime as needed.   multivitamin with minerals Tabs tablet Take 1 tablet by mouth daily.   Oxycodone HCl 10 MG Tabs Take 0.5 tablets (5 mg total) by mouth 3 (three) times daily as needed (pain). What changed: how much to take   risperiDONE 3 MG tablet Commonly known as: RISPERDAL Take 1 tablet (3 mg total) by mouth at bedtime.        Follow-up Information     [facility] Follow up.   Specialty: Home Health Services Why: PT Contact information: [place] [address] [phone]                Allergies  Allergen Reactions   Antibiotic Ear [Neomycin-Polymyxin-Hc]     Other reaction(s): Unknown   Penicillins Other (See Comments)    Yeast infection Did it involve swelling of the face/tongue/throat, SOB, or low BP? No Did it involve sudden or severe rash/hives, skin peeling, or any reaction on the inside of your mouth or nose? No Did you need to seek medical attention at a hospital or doctor's office? Yes When did it last happen? More than 5 years ago  If all above answers are "NO", may proceed with cephalosporin use.     Consultations: Neurology Psychiatry  Procedures/Studies: CT Head Wo Contrast  Result Date: 03/08/2021 CLINICAL DATA:  Mental status change, unknown cause. Found down. Confusion. EXAM: CT HEAD WITHOUT CONTRAST TECHNIQUE: Contiguous axial images were obtained from the base of the skull through the vertex without  intravenous contrast. COMPARISON:  CT head 03/22/2020. FINDINGS: Brain: There is no evidence of acute intracranial hemorrhage, mass lesion, brain edema or extra-axial fluid collection. The ventricles and subarachnoid spaces are appropriately sized for age. There is no CT evidence of acute cortical infarction. Vascular: Minimal intracranial atherosclerosis. No hyperdense vessel identified. Skull: Negative for fracture or focal lesion.  Sinuses/Orbits: The visualized paranasal sinuses and mastoid air cells are clear. No orbital abnormalities are seen. Other: None. IMPRESSION: Stable unremarkable noncontrast head CT. Electronically Signed   By: Richardean Sale M.D.   On: 03/08/2021 13:38   DG Chest Port 1 View  Result Date: 03/08/2021 CLINICAL DATA:  Altered mental status.  Confusion. EXAM: PORTABLE CHEST 1 VIEW COMPARISON:  06/10/2018 FINDINGS: Heart size is normal. Dual lead pacemaker as seen previously. Aortic atherosclerotic calcification is noted. The lungs are clear. The vascularity is normal. No acute bone finding. IMPRESSION: No active disease. Electronically Signed   By: Nelson Chimes M.D.   On: 03/08/2021 13:49     Subjective: Seen and examined at bedside and she is back to her baseline.  She denied any chest pain or shortness of breath.  She is back to her baseline and felt fairly well.  No other concerns or points this time.  Discharge Exam: Vitals:   03/12/21 0532 03/12/21 1334  BP: 132/82 126/79  Pulse: 63 68  Resp: 15 16  Temp: 98.5 F (36.9 C) 98.5 F (36.9 C)  SpO2: 99% 98%   Vitals:   03/11/21 1453 03/11/21 2038 03/12/21 0532 03/12/21 1334  BP: (!) 158/85 (!) 142/89 132/82 126/79  Pulse: (!) 56 (!) 57 63 68  Resp: 16 16 15 16   Temp: 98.6 F (37 C) 98.6 F (37 C) 98.5 F (36.9 C) 98.5 F (36.9 C)  TempSrc:  Oral Oral Oral  SpO2: 99% 99% 99% 98%  Weight:      Height:       General: Pt is alert, awake, not in acute distress; she is thin and cachectic Cardiovascular:  RRR, S1/S2 +, no rubs, no gallops Respiratory: Slight diminished bilaterally, no wheezing, no rhonchi; unlabored breathing Abdominal: Soft, NT, ND, bowel sounds + Extremities: no edema, no cyanosis  The results of significant diagnostics from this hospitalization (including imaging, microbiology, ancillary and laboratory) are listed below for reference.    Microbiology: Recent Results (from the past 240 hour(s))  Resp Panel by RT-PCR (Flu A&B, Covid) Nasopharyngeal Swab     Status: None   Collection Time: 03/08/21  1:22 PM   Specimen: Nasopharyngeal Swab; Nasopharyngeal(NP) swabs in vial transport medium  Result Value Ref Range Status   SARS Coronavirus 2 by RT PCR NEGATIVE NEGATIVE Final    Comment: (NOTE) SARS-CoV-2 target nucleic acids are NOT DETECTED.  The SARS-CoV-2 RNA is generally detectable in upper respiratory specimens during the acute phase of infection. The lowest concentration of SARS-CoV-2 viral copies this assay can detect is 138 copies/mL. A negative result does not preclude SARS-Cov-2 infection and should not be used as the sole basis for treatment or other patient management decisions. A negative result may occur with  improper specimen collection/handling, submission of specimen other than nasopharyngeal swab, presence of viral mutation(s) within the areas targeted by this assay, and inadequate number of viral copies(<138 copies/mL). A negative result must be combined with clinical observations, patient history, and epidemiological information. The expected result is Negative.  Fact Sheet for Patients:  EntrepreneurPulse.com.au  Fact Sheet for Healthcare Providers:  IncredibleEmployment.be  This test is no t yet approved or cleared by the [place] FDA and  has been authorized for detection and/or diagnosis of SARS-CoV-2 by FDA under an Emergency Use Authorization (EUA). This EUA will remain  in effect (meaning this  test can be used) for the duration of the COVID-19 declaration under Section 564(b)(1) of the Act, 21 U.S.C.section 360bbb-3(b)(1),  unless the authorization is terminated  or revoked sooner.       Influenza A by PCR NEGATIVE NEGATIVE Final   Influenza B by PCR NEGATIVE NEGATIVE Final    Comment: (NOTE) The Xpert Xpress SARS-CoV-2/FLU/RSV plus assay is intended as an aid in the diagnosis of influenza from Nasopharyngeal swab specimens and should not be used as a sole basis for treatment. Nasal washings and aspirates are unacceptable for Xpert Xpress SARS-CoV-2/FLU/RSV testing.  Fact Sheet for Patients: EntrepreneurPulse.com.au  Fact Sheet for Healthcare Providers: IncredibleEmployment.be  This test is not yet approved or cleared by the [place] FDA and has been authorized for detection and/or diagnosis of SARS-CoV-2 by FDA under an Emergency Use Authorization (EUA). This EUA will remain in effect (meaning this test can be used) for the duration of the COVID-19 declaration under Section 564(b)(1) of the Act, 21 U.S.C. section 360bbb-3(b)(1), unless the authorization is terminated or revoked.  Performed at [facility] : [facility], [place] [address], [place], [place] [address]   Urine Culture     Status: Abnormal   Collection Time: 03/08/21  2:21 PM   Specimen: In/Out Cath Urine  Result Value Ref Range Status   Specimen Description   Final    IN/OUT CATH URINE Performed at [place] [address] [place], [place] [address]    Special Requests   Final    NONE Performed at [facility], [place] [address] [place], [place] [address]    Culture (A)  Final    10,000 COLONIES/mL ENTEROCOCCUS FAECALIS 30,000 COLONIES/mL LACTOBACILLUS SPECIES Standardized susceptibility testing for this organism is not available. Performed at [facility], [place] [address] [place], [place] [address]     Report Status 03/12/2021 FINAL  Final   Organism ID, Bacteria ENTEROCOCCUS FAECALIS (A)  Final      Susceptibility   Enterococcus faecalis - MIC*    AMPICILLIN <=2 SENSITIVE Sensitive     NITROFURANTOIN <=16 SENSITIVE Sensitive     VANCOMYCIN 1 SENSITIVE Sensitive     * 10,000 COLONIES/mL ENTEROCOCCUS FAECALIS    Labs: BNP (last 3 results) Recent Labs    04/17/20 0259  BNP 25.8   Basic Metabolic Panel: Recent Labs  Lab 03/08/21 1308 03/08/21 1729 03/09/21 0330 03/12/21 0313  NA 137  --  135 137  K 3.2*  --  3.5 3.0*  CL 99  --  99 101  CO2 25  --  23 29  GLUCOSE 105*  --  70 111*  BUN 12  --  17 12  CREATININE 0.77  --  0.81 0.57  CALCIUM 9.2  --  8.6* 8.9  MG 2.1 2.1  --  2.0  PHOS 3.8  --   --  3.2   Liver Function Tests: Recent Labs  Lab 03/08/21 1308 03/08/21 1729 03/09/21 0330 03/12/21 0313  AST 39  --  22 13*  ALT 18  --  14 12  ALKPHOS 56  --  46 43  BILITOT 2.8* 2.1* 2.6* 1.4*  PROT 7.5  --  6.5 6.0*  ALBUMIN 4.8  --  4.0 3.9   Recent Labs  Lab 03/08/21 1308  LIPASE 29   Recent Labs  Lab 03/08/21 1309  AMMONIA 15   CBC: Recent Labs  Lab 03/08/21 1308 03/09/21 0330 03/12/21 0313  WBC 9.9 9.6 9.0  NEUTROABS 7.6  --  5.3  HGB 15.9* 14.5 14.5  HCT 48.4* 43.8 43.4  MCV 83.2 83.0 81.1  PLT 244 229 241   Cardiac Enzymes: Recent Labs  Lab 03/11/21 1049  CKTOTAL 139   BNP: Invalid input(s): POCBNP CBG: No results for input(s): GLUCAP in the last 168 hours. D-Dimer No results for input(s): DDIMER in the last 72 hours. Hgb A1c No results for input(s): HGBA1C in the last 72 hours. Lipid Profile No results for input(s): CHOL, HDL, LDLCALC, TRIG, CHOLHDL, LDLDIRECT in the last 72 hours. Thyroid function studies Recent Labs    03/12/21 0313  TSH 2.495   Anemia work up Recent Labs    03/10/21 1653  VITAMINB12 2,860*   Urinalysis    Component Value Date/Time   COLORURINE AMBER (A) 03/08/2021 1421   APPEARANCEUR HAZY (A)  03/08/2021 1421   LABSPEC 1.021 03/08/2021 1421   PHURINE 5.0 03/08/2021 1421   GLUCOSEU NEGATIVE 03/08/2021 1421   HGBUR NEGATIVE 03/08/2021 1421   BILIRUBINUR NEGATIVE 03/08/2021 1421   KETONESUR 20 (A) 03/08/2021 1421   PROTEINUR 30 (A) 03/08/2021 1421   NITRITE NEGATIVE 03/08/2021 1421   LEUKOCYTESUR TRACE (A) 03/08/2021 1421   Sepsis Labs Invalid input(s): PROCALCITONIN,  WBC,  LACTICIDVEN Microbiology Recent Results (from the past 240 hour(s))  Resp Panel by RT-PCR (Flu A&B, Covid) Nasopharyngeal Swab     Status: None   Collection Time: 03/08/21  1:22 PM   Specimen: Nasopharyngeal Swab; Nasopharyngeal(NP) swabs in vial transport medium  Result Value Ref Range Status   SARS Coronavirus 2 by RT PCR NEGATIVE NEGATIVE Final    Comment: (NOTE) SARS-CoV-2 target nucleic acids are NOT DETECTED.  The SARS-CoV-2 RNA is generally detectable in upper respiratory specimens during the acute phase of infection. The lowest concentration of SARS-CoV-2 viral copies this assay can detect is 138 copies/mL. A negative result does not preclude SARS-Cov-2 infection and should not be used as the sole basis for treatment or other patient management decisions. A negative result may occur with  improper specimen collection/handling, submission of specimen other than nasopharyngeal swab, presence of viral mutation(s) within the areas targeted by this assay, and inadequate number of viral copies(<138 copies/mL). A negative result must be combined with clinical observations, patient history, and epidemiological information. The expected result is Negative.  Fact Sheet for Patients:  EntrepreneurPulse.com.au  Fact Sheet for Healthcare Providers:  IncredibleEmployment.be  This test is no t yet approved or cleared by the [place] FDA and  has been authorized for detection and/or diagnosis of SARS-CoV-2 by FDA under an Emergency Use Authorization (EUA). This  EUA will remain  in effect (meaning this test can be used) for the duration of the COVID-19 declaration under Section 564(b)(1) of the Act, 21 U.S.C.section 360bbb-3(b)(1), unless the authorization is terminated  or revoked sooner.       Influenza A by PCR NEGATIVE NEGATIVE Final   Influenza B by PCR NEGATIVE NEGATIVE Final    Comment: (NOTE) The Xpert Xpress SARS-CoV-2/FLU/RSV plus assay is intended as an aid in the diagnosis of influenza from Nasopharyngeal swab specimens and should not be used as a sole basis for treatment. Nasal washings and aspirates are unacceptable for Xpert Xpress SARS-CoV-2/FLU/RSV testing.  Fact Sheet for Patients: EntrepreneurPulse.com.au  Fact Sheet for Healthcare Providers: IncredibleEmployment.be  This test is not yet approved or cleared by the [place] FDA and has been authorized for detection and/or diagnosis of SARS-CoV-2 by FDA under an Emergency Use Authorization (EUA). This EUA will remain in effect (meaning this test can be used) for the duration of the COVID-19 declaration under Section 564(b)(1) of  the Act, 21 U.S.C. section 360bbb-3(b)(1), unless the authorization is terminated or revoked.  Performed at [facility], [place] [address], [place], [place] [address]   Urine Culture     Status: Abnormal   Collection Time: 03/08/21  2:21 PM   Specimen: In/Out Cath Urine  Result Value Ref Range Status   Specimen Description   Final    IN/OUT CATH URINE Performed at [place] [address], [place], [place] [address]    Special Requests   Final    NONE Performed at [facility], [place] [address] [place], [place] [address]    Culture (A)  Final    10,000 COLONIES/mL ENTEROCOCCUS FAECALIS 30,000 COLONIES/mL LACTOBACILLUS SPECIES Standardized susceptibility testing for this organism is not available. Performed at [facility],  [place] [address], [place], [place] [address]    Report Status 03/12/2021 FINAL  Final   Organism ID, Bacteria ENTEROCOCCUS FAECALIS (A)  Final      Susceptibility   Enterococcus faecalis - MIC*    AMPICILLIN <=2 SENSITIVE Sensitive     NITROFURANTOIN <=16 SENSITIVE Sensitive     VANCOMYCIN 1 SENSITIVE Sensitive     * 10,000 COLONIES/mL ENTEROCOCCUS FAECALIS   Time coordinating discharge: 35 minutes  SIGNED:  Kerney Elbe, DO [facility] 03/12/2021, 6:00 PM Pager is on AMION  If 7PM-7AM, please contact night-coverage [email or web address]

## 2021-03-14 LAB — DRUG PROFILE, UR, 9 DRUGS (LABCORP)
Amphetamines, Urine: NEGATIVE ng/mL
Barbiturate, Ur: NEGATIVE ng/mL
Benzodiazepine Quant, Ur: NEGATIVE ng/mL
Cannabinoid Quant, Ur: NEGATIVE ng/mL
Cocaine (Metab.): NEGATIVE ng/mL
Methadone Screen, Urine: NEGATIVE ng/mL
Opiate Quant, Ur: NEGATIVE ng/mL
Phencyclidine, Ur: NEGATIVE ng/mL
Propoxyphene, Urine: NEGATIVE ng/mL

## 2021-03-17 LAB — VITAMIN E
Vitamin E (Alpha Tocopherol): 8.9 mg/L — ABNORMAL LOW (ref 9.0–29.0)
Vitamin E(Gamma Tocopherol): 0.6 mg/L (ref 0.5–4.9)

## 2021-03-26 ENCOUNTER — Other Ambulatory Visit: Payer: Self-pay | Admitting: Physician Assistant

## 2021-05-03 ENCOUNTER — Emergency Department (HOSPITAL_COMMUNITY): Payer: Medicare Other

## 2021-05-03 ENCOUNTER — Other Ambulatory Visit: Payer: Self-pay

## 2021-05-03 ENCOUNTER — Inpatient Hospital Stay (HOSPITAL_COMMUNITY)
Admission: EM | Admit: 2021-05-03 | Discharge: 2021-05-08 | DRG: 564 | Disposition: A | Discharge status: Home / Self Care | Payer: Medicare Other | Attending: Internal Medicine | Admitting: Internal Medicine

## 2021-05-03 DIAGNOSIS — Z681 Body mass index (BMI) 19 or less, adult: Secondary | ICD-10-CM | POA: Diagnosis not present

## 2021-05-03 DIAGNOSIS — R636 Underweight: Secondary | ICD-10-CM | POA: Diagnosis present

## 2021-05-03 DIAGNOSIS — Z90711 Acquired absence of uterus with remaining cervical stump: Secondary | ICD-10-CM

## 2021-05-03 DIAGNOSIS — E861 Hypovolemia: Secondary | ICD-10-CM | POA: Diagnosis present

## 2021-05-03 DIAGNOSIS — W1830XA Fall on same level, unspecified, initial encounter: Secondary | ICD-10-CM | POA: Diagnosis present

## 2021-05-03 DIAGNOSIS — R000 Tachycardia, unspecified: Secondary | ICD-10-CM | POA: Diagnosis not present

## 2021-05-03 DIAGNOSIS — Z8249 Family history of ischemic heart disease and other diseases of the circulatory system: Secondary | ICD-10-CM

## 2021-05-03 DIAGNOSIS — Z8541 Personal history of malignant neoplasm of cervix uteri: Secondary | ICD-10-CM

## 2021-05-03 DIAGNOSIS — R0602 Shortness of breath: Secondary | ICD-10-CM | POA: Diagnosis not present

## 2021-05-03 DIAGNOSIS — T796XXA Traumatic ischemia of muscle, initial encounter: Secondary | ICD-10-CM | POA: Diagnosis not present

## 2021-05-03 DIAGNOSIS — E440 Moderate protein-calorie malnutrition: Secondary | ICD-10-CM | POA: Diagnosis present

## 2021-05-03 DIAGNOSIS — F418 Other specified anxiety disorders: Secondary | ICD-10-CM | POA: Diagnosis present

## 2021-05-03 DIAGNOSIS — M6282 Rhabdomyolysis: Secondary | ICD-10-CM | POA: Diagnosis not present

## 2021-05-03 DIAGNOSIS — Z880 Allergy status to penicillin: Secondary | ICD-10-CM | POA: Diagnosis not present

## 2021-05-03 DIAGNOSIS — Z743 Need for continuous supervision: Secondary | ICD-10-CM | POA: Diagnosis not present

## 2021-05-03 DIAGNOSIS — F419 Anxiety disorder, unspecified: Secondary | ICD-10-CM | POA: Diagnosis present

## 2021-05-03 DIAGNOSIS — R55 Syncope and collapse: Secondary | ICD-10-CM | POA: Diagnosis not present

## 2021-05-03 DIAGNOSIS — Z981 Arthrodesis status: Secondary | ICD-10-CM

## 2021-05-03 DIAGNOSIS — R4182 Altered mental status, unspecified: Secondary | ICD-10-CM

## 2021-05-03 DIAGNOSIS — G9341 Metabolic encephalopathy: Secondary | ICD-10-CM | POA: Diagnosis present

## 2021-05-03 DIAGNOSIS — R64 Cachexia: Secondary | ICD-10-CM | POA: Diagnosis present

## 2021-05-03 DIAGNOSIS — I499 Cardiac arrhythmia, unspecified: Secondary | ICD-10-CM | POA: Diagnosis not present

## 2021-05-03 DIAGNOSIS — Z043 Encounter for examination and observation following other accident: Secondary | ICD-10-CM | POA: Diagnosis not present

## 2021-05-03 DIAGNOSIS — M1612 Unilateral primary osteoarthritis, left hip: Secondary | ICD-10-CM | POA: Diagnosis not present

## 2021-05-03 DIAGNOSIS — N179 Acute kidney failure, unspecified: Secondary | ICD-10-CM | POA: Diagnosis not present

## 2021-05-03 DIAGNOSIS — Z20822 Contact with and (suspected) exposure to covid-19: Secondary | ICD-10-CM | POA: Diagnosis not present

## 2021-05-03 DIAGNOSIS — E872 Acidosis, unspecified: Secondary | ICD-10-CM | POA: Diagnosis present

## 2021-05-03 DIAGNOSIS — Z9079 Acquired absence of other genital organ(s): Secondary | ICD-10-CM

## 2021-05-03 DIAGNOSIS — E876 Hypokalemia: Secondary | ICD-10-CM | POA: Diagnosis present

## 2021-05-03 DIAGNOSIS — E878 Other disorders of electrolyte and fluid balance, not elsewhere classified: Secondary | ICD-10-CM

## 2021-05-03 DIAGNOSIS — F319 Bipolar disorder, unspecified: Secondary | ICD-10-CM | POA: Diagnosis present

## 2021-05-03 DIAGNOSIS — Z881 Allergy status to other antibiotic agents status: Secondary | ICD-10-CM

## 2021-05-03 DIAGNOSIS — G8929 Other chronic pain: Secondary | ICD-10-CM

## 2021-05-03 DIAGNOSIS — R609 Edema, unspecified: Secondary | ICD-10-CM | POA: Diagnosis not present

## 2021-05-03 DIAGNOSIS — Z90722 Acquired absence of ovaries, bilateral: Secondary | ICD-10-CM

## 2021-05-03 DIAGNOSIS — Z79899 Other long term (current) drug therapy: Secondary | ICD-10-CM

## 2021-05-03 DIAGNOSIS — D72829 Elevated white blood cell count, unspecified: Secondary | ICD-10-CM | POA: Diagnosis present

## 2021-05-03 DIAGNOSIS — G894 Chronic pain syndrome: Secondary | ICD-10-CM | POA: Diagnosis present

## 2021-05-03 DIAGNOSIS — E860 Dehydration: Secondary | ICD-10-CM | POA: Diagnosis present

## 2021-05-03 DIAGNOSIS — W19XXXA Unspecified fall, initial encounter: Secondary | ICD-10-CM

## 2021-05-03 DIAGNOSIS — Z950 Presence of cardiac pacemaker: Secondary | ICD-10-CM | POA: Diagnosis not present

## 2021-05-03 DIAGNOSIS — F17210 Nicotine dependence, cigarettes, uncomplicated: Secondary | ICD-10-CM | POA: Diagnosis not present

## 2021-05-03 DIAGNOSIS — S0990XA Unspecified injury of head, initial encounter: Secondary | ICD-10-CM | POA: Diagnosis not present

## 2021-05-03 DIAGNOSIS — S0993XA Unspecified injury of face, initial encounter: Secondary | ICD-10-CM | POA: Diagnosis not present

## 2021-05-03 DIAGNOSIS — R7989 Other specified abnormal findings of blood chemistry: Secondary | ICD-10-CM | POA: Diagnosis present

## 2021-05-03 DIAGNOSIS — S199XXA Unspecified injury of neck, initial encounter: Secondary | ICD-10-CM | POA: Diagnosis not present

## 2021-05-03 LAB — COMPREHENSIVE METABOLIC PANEL
ALT: 26 U/L (ref 0–44)
AST: 105 U/L — ABNORMAL HIGH (ref 15–41)
Albumin: 3.9 g/dL (ref 3.5–5.0)
Alkaline Phosphatase: 58 U/L (ref 38–126)
Anion gap: 13 (ref 5–15)
BUN: 21 mg/dL (ref 8–23)
CO2: 23 mmol/L (ref 22–32)
Calcium: 9.4 mg/dL (ref 8.9–10.3)
Chloride: 107 mmol/L (ref 98–111)
Creatinine, Ser: 1.06 mg/dL — ABNORMAL HIGH (ref 0.44–1.00)
GFR, Estimated: 59 mL/min — ABNORMAL LOW (ref >60)
Glucose, Bld: 111 mg/dL — ABNORMAL HIGH (ref 70–99)
Potassium: 3.8 mmol/L (ref 3.5–5.1)
Sodium: 143 mmol/L (ref 135–145)
Total Bilirubin: 1.6 mg/dL — ABNORMAL HIGH (ref 0.3–1.2)
Total Protein: 6.9 g/dL (ref 6.5–8.1)

## 2021-05-03 LAB — URINALYSIS, ROUTINE W REFLEX MICROSCOPIC
Bacteria, UA: NONE SEEN
Bilirubin Urine: NEGATIVE
Glucose, UA: NEGATIVE mg/dL
Hgb urine dipstick: NEGATIVE
Ketones, ur: 20 mg/dL — AB
Leukocytes,Ua: NEGATIVE
Nitrite: NEGATIVE
Protein, ur: 30 mg/dL — AB
Specific Gravity, Urine: 1.024 (ref 1.005–1.030)
pH: 5 (ref 5.0–8.0)

## 2021-05-03 LAB — CBC WITH DIFFERENTIAL/PLATELET
Abs Immature Granulocytes: 0.08 10*3/uL — ABNORMAL HIGH (ref 0.00–0.07)
Basophils Absolute: 0 10*3/uL (ref 0.0–0.1)
Basophils Relative: 0 %
Eosinophils Absolute: 0 10*3/uL (ref 0.0–0.5)
Eosinophils Relative: 0 %
HCT: 52.1 % — ABNORMAL HIGH (ref 36.0–46.0)
Hemoglobin: 16.7 g/dL — ABNORMAL HIGH (ref 12.0–15.0)
Immature Granulocytes: 0 %
Lymphocytes Relative: 8 %
Lymphs Abs: 1.4 10*3/uL (ref 0.7–4.0)
MCH: 28 pg (ref 26.0–34.0)
MCHC: 32.1 g/dL (ref 30.0–36.0)
MCV: 87.3 fL (ref 80.0–100.0)
Monocytes Absolute: 1 10*3/uL (ref 0.1–1.0)
Monocytes Relative: 5 %
Neutro Abs: 16 10*3/uL — ABNORMAL HIGH (ref 1.7–7.7)
Neutrophils Relative %: 87 %
Platelets: 358 10*3/uL (ref 150–400)
RBC: 5.97 MIL/uL — ABNORMAL HIGH (ref 3.87–5.11)
RDW: 18.8 % — ABNORMAL HIGH (ref 11.5–15.5)
WBC: 18.5 10*3/uL — ABNORMAL HIGH (ref 4.0–10.5)
nRBC: 0 % (ref 0.0–0.2)

## 2021-05-03 LAB — PROTIME-INR
INR: 1.1 (ref 0.8–1.2)
Prothrombin Time: 14.3 seconds (ref 11.4–15.2)

## 2021-05-03 LAB — CBG MONITORING, ED: Glucose-Capillary: 112 mg/dL — ABNORMAL HIGH (ref 70–99)

## 2021-05-03 LAB — RESP PANEL BY RT-PCR (FLU A&B, COVID) ARPGX2
Influenza A by PCR: NEGATIVE
Influenza B by PCR: NEGATIVE
SARS Coronavirus 2 by RT PCR: NEGATIVE

## 2021-05-03 LAB — LACTIC ACID, PLASMA
Lactic Acid, Venous: 2.3 mmol/L (ref 0.5–1.9)
Lactic Acid, Venous: 1.8 mmol/L (ref 0.5–1.9)

## 2021-05-03 LAB — APTT: aPTT: 33 seconds (ref 24–36)

## 2021-05-03 LAB — MAGNESIUM: Magnesium: 2 mg/dL (ref 1.7–2.4)

## 2021-05-03 LAB — PHOSPHORUS: Phosphorus: 3.1 mg/dL (ref 2.5–4.6)

## 2021-05-03 LAB — TROPONIN I (HIGH SENSITIVITY)
Troponin I (High Sensitivity): 19 ng/L — ABNORMAL HIGH (ref <18)
Troponin I (High Sensitivity): 20 ng/L — ABNORMAL HIGH (ref <18)

## 2021-05-03 LAB — CK: Total CK: 6126 U/L — ABNORMAL HIGH (ref 38–234)

## 2021-05-03 MED ORDER — SODIUM CHLORIDE 0.9 % IV BOLUS
1000.0000 mL | Freq: Once | INTRAVENOUS | Status: AC
  Administered 2021-05-03: 1000 mL via INTRAVENOUS

## 2021-05-03 MED ORDER — SODIUM CHLORIDE 0.9% FLUSH
3.0000 mL | Freq: Two times a day (BID) | INTRAVENOUS | Status: DC
  Administered 2021-05-03 – 2021-05-08 (×6): 3 mL via INTRAVENOUS

## 2021-05-03 MED ORDER — ACETAMINOPHEN 325 MG PO TABS: 650.0000 mg | ORAL_TABLET | Freq: Four times a day (QID) | ORAL | Status: DC | PRN

## 2021-05-03 MED ORDER — ALPRAZOLAM 0.25 MG PO TABS
0.5000 mg | ORAL_TABLET | Freq: Three times a day (TID) | ORAL | Status: DC | PRN
  Administered 2021-05-05 – 2021-05-07 (×6): 0.5 mg via ORAL
  Filled 2021-05-03 (×6): qty 2

## 2021-05-03 MED ORDER — RISPERIDONE 3 MG PO TABS
3.0000 mg | ORAL_TABLET | Freq: Every day | ORAL | Status: DC
  Administered 2021-05-03 – 2021-05-07 (×5): 3 mg via ORAL
  Filled 2021-05-03 (×6): qty 1

## 2021-05-03 MED ORDER — SODIUM CHLORIDE 0.9 % IV SOLN: INTRAVENOUS | Status: DC

## 2021-05-03 MED ORDER — OXYCODONE HCL 5 MG PO TABS
5.0000 mg | ORAL_TABLET | Freq: Three times a day (TID) | ORAL | Status: DC | PRN
  Administered 2021-05-04 – 2021-05-05 (×2): 5 mg via ORAL
  Filled 2021-05-03 (×2): qty 1

## 2021-05-03 MED ORDER — ACETAMINOPHEN 650 MG RE SUPP: 650.0000 mg | Freq: Four times a day (QID) | RECTAL | Status: DC | PRN

## 2021-05-03 NOTE — ED Provider Notes (Signed)
Care assumed from [place], [place], at shift change, please see their notes for full documentation of patient's complaint/HPI. Briefly, pt here with complaint of fall with unknown downtime.  Physical Exam  BP (!) 127/115    Pulse (!) 108    Temp 98.6 F (37 C) (Rectal)    Resp 18    SpO2 98%   Physical Exam Vitals and nursing note reviewed.  Constitutional:      Appearance: She is not ill-appearing.  HENT:     Head: Normocephalic and atraumatic.  Eyes:     Extraocular Movements: Extraocular movements intact.     Conjunctiva/sclera: Conjunctivae normal.     Pupils: Pupils are equal, round, and reactive to light.     Comments: No hyphema appreciated to L eye. EOMI.   Cardiovascular:     Rate and Rhythm: Normal rate and regular rhythm.  Pulmonary:     Effort: Pulmonary effort is normal.     Breath sounds: Normal breath sounds.  Skin:    General: Skin is warm and dry.     Coloration: Skin is not jaundiced.  Neurological:     Mental Status: She is alert.    Procedures  Procedures  Results for orders placed or performed during the hospital encounter of 05/03/21  Resp Panel by RT-PCR (Flu A&B, Covid) Nasopharyngeal Swab   Specimen: Nasopharyngeal Swab; Nasopharyngeal(NP) swabs in vial transport medium  Result Value Ref Range   SARS Coronavirus 2 by RT PCR NEGATIVE NEGATIVE   Influenza A by PCR NEGATIVE NEGATIVE   Influenza B by PCR NEGATIVE NEGATIVE  CBC with Differential  Result Value Ref Range   WBC 18.5 (H) 4.0 - 10.5 K/uL   RBC 5.97 (H) 3.87 - 5.11 MIL/uL   Hemoglobin 16.7 (H) 12.0 - 15.0 g/dL   HCT 52.1 (H) 36.0 - 46.0 %   MCV 87.3 80.0 - 100.0 fL   MCH 28.0 26.0 - 34.0 pg   MCHC 32.1 30.0 - 36.0 g/dL   RDW 18.8 (H) 11.5 - 15.5 %   Platelets 358 150 - 400 K/uL   nRBC 0.0 0.0 - 0.2 %   Neutrophils Relative % 87 %   Neutro Abs 16.0 (H) 1.7 - 7.7 K/uL   Lymphocytes Relative 8 %   Lymphs Abs 1.4 0.7 - 4.0 K/uL   Monocytes Relative 5 %   Monocytes Absolute 1.0  0.1 - 1.0 K/uL   Eosinophils Relative 0 %   Eosinophils Absolute 0.0 0.0 - 0.5 K/uL   Basophils Relative 0 %   Basophils Absolute 0.0 0.0 - 0.1 K/uL   Immature Granulocytes 0 %   Abs Immature Granulocytes 0.08 (H) 0.00 - 0.07 K/uL  Comprehensive metabolic panel  Result Value Ref Range   Sodium 143 135 - 145 mmol/L   Potassium 3.8 3.5 - 5.1 mmol/L   Chloride 107 98 - 111 mmol/L   CO2 23 22 - 32 mmol/L   Glucose, Bld 111 (H) 70 - 99 mg/dL   BUN 21 8 - 23 mg/dL   Creatinine, Ser 1.06 (H) 0.44 - 1.00 mg/dL   Calcium 9.4 8.9 - 10.3 mg/dL   Total Protein 6.9 6.5 - 8.1 g/dL   Albumin 3.9 3.5 - 5.0 g/dL   AST 105 (H) 15 - 41 U/L   ALT 26 0 - 44 U/L   Alkaline Phosphatase 58 38 - 126 U/L   Total Bilirubin 1.6 (H) 0.3 - 1.2 mg/dL   GFR, Estimated 59 (L) >60 mL/min  Anion gap 13 5 - 15  CK  Result Value Ref Range   Total CK 6,126 (H) 38 - 234 U/L  Lactic acid, plasma  Result Value Ref Range   Lactic Acid, Venous 2.3 (HH) 0.5 - 1.9 mmol/L  APTT  Result Value Ref Range   aPTT 33 24 - 36 seconds  Protime-INR  Result Value Ref Range   Prothrombin Time 14.3 11.4 - 15.2 seconds   INR 1.1 0.8 - 1.2  CBG monitoring, ED  Result Value Ref Range   Glucose-Capillary 112 (H) 70 - 99 mg/dL   DG Chest 2 View  Result Date: 05/03/2021 CLINICAL DATA:  Shortness of breath. EXAM: CHEST - 2 VIEW COMPARISON:  March 08, 2021. FINDINGS: The heart size and mediastinal contours are within normal limits. Both lungs are clear. Left-sided pacemaker is unchanged in position. The visualized skeletal structures are unremarkable. IMPRESSION: No active cardiopulmonary disease. Electronically Signed   By: Marijo Conception M.D.   On: 05/03/2021 15:04   CT Head Wo Contrast  Result Date: 05/03/2021 CLINICAL DATA:  Head trauma, moderate-severe; Neck trauma, dangerous injury mechanism (Age 65-64y); Facial trauma, blunt EXAM: CT HEAD WITHOUT CONTRAST CT MAXILLOFACIAL WITHOUT CONTRAST CT CERVICAL SPINE WITHOUT  CONTRAST TECHNIQUE: Multidetector CT imaging of the head, cervical spine, and maxillofacial structures were performed using the standard protocol without intravenous contrast. Multiplanar CT image reconstructions of the cervical spine and maxillofacial structures were also generated. RADIATION DOSE REDUCTION: This exam was performed according to the departmental dose-optimization program which includes automated exposure control, adjustment of the mA and/or kV according to patient size and/or use of iterative reconstruction technique. COMPARISON:  CT head 03/08/2021. CT cervical spine 02/25/2020. FINDINGS: CT HEAD FINDINGS Brain: No evidence of acute infarction, hemorrhage, hydrocephalus, extra-axial collection or mass lesion/mass effect. Vascular: Calcific intracranial atherosclerosis. No hyperdense vessel identified. Skull: No acute fracture. Other: No mastoid effusions. CT MAXILLOFACIAL FINDINGS Osseous: No fracture or mandibular dislocation. No destructive process. Orbits: Negative. No traumatic or inflammatory finding. Sinuses: Right frontal sinus mucosal thickening with frothy secretions. Opacified right frontoethmoidal recess. Otherwise, visualized sinuses are largely clear with mild scattered mucosal thickening. Soft tissues: Left forehead/periorbital and left she edema. CT CERVICAL SPINE FINDINGS Alignment: Similar alignment.  No substantial sagittal subluxation. Skull base and vertebrae: Vertebral body heights are similar to prior. No evidence of acute fracture. C4-C6 ACDF with solid bony fusion. Soft tissues and spinal canal: No prevertebral fluid or swelling. No visible canal hematoma. Disc levels:  Similar multilevel degenerative change. Upper chest: Visualized lung apices are clear. IMPRESSION: 1. No evidence of acute intracranial abnormality. 2. Left forehead/periorbital and left cheek edema, nonspecific but presumably contusion in the setting trauma. No acute facial fracture. 3. No evidence of acute  fracture or traumatic malalignment in the cervical spine. Solid C4-C6 ACDF. Electronically Signed   By: Margaretha Sheffield M.D.   On: 05/03/2021 15:25   CT Cervical Spine Wo Contrast  Result Date: 05/03/2021 CLINICAL DATA:  Head trauma, moderate-severe; Neck trauma, dangerous injury mechanism (Age 34-64y); Facial trauma, blunt EXAM: CT HEAD WITHOUT CONTRAST CT MAXILLOFACIAL WITHOUT CONTRAST CT CERVICAL SPINE WITHOUT CONTRAST TECHNIQUE: Multidetector CT imaging of the head, cervical spine, and maxillofacial structures were performed using the standard protocol without intravenous contrast. Multiplanar CT image reconstructions of the cervical spine and maxillofacial structures were also generated. RADIATION DOSE REDUCTION: This exam was performed according to the departmental dose-optimization program which includes automated exposure control, adjustment of the mA and/or kV according to patient size  and/or use of iterative reconstruction technique. COMPARISON:  CT head 03/08/2021. CT cervical spine 02/25/2020. FINDINGS: CT HEAD FINDINGS Brain: No evidence of acute infarction, hemorrhage, hydrocephalus, extra-axial collection or mass lesion/mass effect. Vascular: Calcific intracranial atherosclerosis. No hyperdense vessel identified. Skull: No acute fracture. Other: No mastoid effusions. CT MAXILLOFACIAL FINDINGS Osseous: No fracture or mandibular dislocation. No destructive process. Orbits: Negative. No traumatic or inflammatory finding. Sinuses: Right frontal sinus mucosal thickening with frothy secretions. Opacified right frontoethmoidal recess. Otherwise, visualized sinuses are largely clear with mild scattered mucosal thickening. Soft tissues: Left forehead/periorbital and left she edema. CT CERVICAL SPINE FINDINGS Alignment: Similar alignment.  No substantial sagittal subluxation. Skull base and vertebrae: Vertebral body heights are similar to prior. No evidence of acute fracture. C4-C6 ACDF with solid bony  fusion. Soft tissues and spinal canal: No prevertebral fluid or swelling. No visible canal hematoma. Disc levels:  Similar multilevel degenerative change. Upper chest: Visualized lung apices are clear. IMPRESSION: 1. No evidence of acute intracranial abnormality. 2. Left forehead/periorbital and left cheek edema, nonspecific but presumably contusion in the setting trauma. No acute facial fracture. 3. No evidence of acute fracture or traumatic malalignment in the cervical spine. Solid C4-C6 ACDF. Electronically Signed   By: Margaretha Sheffield M.D.   On: 05/03/2021 15:25   DG Shoulder Left  Result Date: 05/03/2021 CLINICAL DATA:  Unwitnessed fall. EXAM: LEFT SHOULDER - 2+ VIEW COMPARISON:  June 07, 2013. FINDINGS: There is no evidence of fracture or dislocation. There is no evidence of arthropathy or other focal bone abnormality. Soft tissues are unremarkable. IMPRESSION: Negative. Electronically Signed   By: Marijo Conception M.D.   On: 05/03/2021 15:06   DG Knee Complete 4 Views Left  Result Date: 05/03/2021 CLINICAL DATA:  Unwitnessed fall. EXAM: LEFT KNEE - COMPLETE 4+ VIEW COMPARISON:  None. FINDINGS: No evidence of fracture, dislocation, or joint effusion. No evidence of arthropathy or other focal bone abnormality. Soft tissues are unremarkable. IMPRESSION: Negative. Electronically Signed   By: Marijo Conception M.D.   On: 05/03/2021 15:05   DG Hip Unilat W or Wo Pelvis 2-3 Views Left  Result Date: 05/03/2021 CLINICAL DATA:  Fall, pain EXAM: DG HIP (WITH OR WITHOUT PELVIS) 2-3V LEFT COMPARISON:  None. FINDINGS: There is no evidence of acute fracture. Mild osteoarthritis of the hips. Partially visualized lumbar spine fusion hardware. IMPRESSION: No evidence of acute left hip fracture. Note that if the patient is unable to bear weight and clinical suspicion for non-displaced hip fracture is significant, CT or MRI would be more sensitive. Electronically Signed   By: Maurine Simmering M.D.   On: 05/03/2021 16:17    CT Maxillofacial Wo Contrast  Result Date: 05/03/2021 CLINICAL DATA:  Head trauma, moderate-severe; Neck trauma, dangerous injury mechanism (Age 41-64y); Facial trauma, blunt EXAM: CT HEAD WITHOUT CONTRAST CT MAXILLOFACIAL WITHOUT CONTRAST CT CERVICAL SPINE WITHOUT CONTRAST TECHNIQUE: Multidetector CT imaging of the head, cervical spine, and maxillofacial structures were performed using the standard protocol without intravenous contrast. Multiplanar CT image reconstructions of the cervical spine and maxillofacial structures were also generated. RADIATION DOSE REDUCTION: This exam was performed according to the departmental dose-optimization program which includes automated exposure control, adjustment of the mA and/or kV according to patient size and/or use of iterative reconstruction technique. COMPARISON:  CT head 03/08/2021. CT cervical spine 02/25/2020. FINDINGS: CT HEAD FINDINGS Brain: No evidence of acute infarction, hemorrhage, hydrocephalus, extra-axial collection or mass lesion/mass effect. Vascular: Calcific intracranial atherosclerosis. No hyperdense vessel identified. Skull: No acute  fracture. Other: No mastoid effusions. CT MAXILLOFACIAL FINDINGS Osseous: No fracture or mandibular dislocation. No destructive process. Orbits: Negative. No traumatic or inflammatory finding. Sinuses: Right frontal sinus mucosal thickening with frothy secretions. Opacified right frontoethmoidal recess. Otherwise, visualized sinuses are largely clear with mild scattered mucosal thickening. Soft tissues: Left forehead/periorbital and left she edema. CT CERVICAL SPINE FINDINGS Alignment: Similar alignment.  No substantial sagittal subluxation. Skull base and vertebrae: Vertebral body heights are similar to prior. No evidence of acute fracture. C4-C6 ACDF with solid bony fusion. Soft tissues and spinal canal: No prevertebral fluid or swelling. No visible canal hematoma. Disc levels:  Similar multilevel degenerative  change. Upper chest: Visualized lung apices are clear. IMPRESSION: 1. No evidence of acute intracranial abnormality. 2. Left forehead/periorbital and left cheek edema, nonspecific but presumably contusion in the setting trauma. No acute facial fracture. 3. No evidence of acute fracture or traumatic malalignment in the cervical spine. Solid C4-C6 ACDF. Electronically Signed   By: Margaretha Sheffield M.D.   On: 05/03/2021 15:25    ED Course / MDM   Clinical Course as of 05/03/21 1821  Wed May 03, 2021  1536 Temp: 97.9 F (36.6 C) Rectal temp [MV]    Clinical Course User Index [MV] Tabitha Maize, PA-C   Medical Decision Making Pt signed out by morning PA. Labs and images pending at time of sign out. History of laying on ground for 2 days prior to being found by family member. Concern for possible rhabdo.   Problems Addressed: AKI (acute kidney injury) ([place]): acute illness or injury Fall, initial encounter: acute illness or injury Non-traumatic rhabdomyolysis: acute illness or injury  Amount and/or Complexity of Data Reviewed Labs: ordered.    Details: CBC with elevated leukocytosis 18,500. Pt afebrile on arrival. CXR without signs of pneumonia. Awaiting urinalysis at this time. RBC, hgb, and hct all elevatd as well. Suspect hemoconcentration s/2 dehydration and elevated leukocytosis s/2 acute phase reactant/pain. Will continue to monitor.   CMP with glucose 111. Creatinine 1.06 (baseline 0.50) and BUN 21. Consistent with AKI. Fluids running at this time.   Lactic acid elevated at 2.3  CK significantly elevated at 6,126. Additional liter of fluids ordered at this time. Will admit for rhabdo. Radiology: ordered.    Details: All images negative for acute abnormalities. ECG/medicine tests: ordered. Discussion of management or test interpretation with external provider(s): Discussed case with [facility] Dr. Rogers Blocker who agrees to accept patient for admission.   Risk Decision regarding  hospitalization.         Tabitha Maize, PA-C 05/03/21 1821    Tabitha Rasmussen, MD 05/04/21 [phone]

## 2021-05-03 NOTE — Assessment & Plan Note (Addendum)
On oxycodone 5mg  Tid prn and PMP webside verified  Will continue this

## 2021-05-03 NOTE — H&P (Signed)
History and Physical    Patient: [name] MGQ:[id number] DOB: 1955/11/24 DOA: 05/03/2021 DOS: the patient was seen and examined on 05/03/2021 PCP: Lujean Amel, MD  Patient coming from: Home - lives alone, but daughter moving in with her in a few weeks.    Chief Complaint: fall vs. syncope  HPI: [name] is a 66 y.o. female with medical history significant of anxiety, bipolar disorder, depression, sinus pauses s/p PPM, cervical cancer, chronic pain who presented to ED after a fall vs. Syncope today.  She apparently fell 2 days ago and has been on the ground for 2 days. She states she doesn't remember falling, but apparently fell on Monday as this was the last time anyone heard from her. She remembers lying on the ground for 2 days, but couldn't get up. Her daughter came over and called 911. She denies any headaches/dizziness, chest pain or palpitations. No recent illness. No N/V/D. She has been eating and drinking well.  She denies any fever/chills, shortness of breath or cough, dysuria, leg swelling. No missed medication. She is on chronic oxycodone and xanax.   History of syncopal episodes in 2014 due to sinus pauses and insertion of PPM.   Admitted in 02/2021 for AMS and recommended at that time to decrease xanax to .5mg  TID from 1mg  TID.     ER Course:  vitals: afebrile, bp: 125/89, HR: 105, RR: 23, oxygen: 100%RA Pertinent labs: CK 6126, creatinine 1.06, AST 105, lactic acid: 2.3, UA pending, WBC: 18.5 CT head/neck/maxillofacial with no acute finding.  2L IVF ordered in ED and trh asked to admit.    Review of Systems: As mentioned in the history of present illness. All other systems reviewed and are negative. Past Medical History:  Diagnosis Date   Anxiety    anxiety and mild depressive order systoms   Bipolar disorder ([place])     (02/20/2013)   Cervical cancer ([place]) 03/19/1980   Depression    Dysrhythmia    Headache(784.0)    "monthly" (02/20/2013)   Pacemaker     medtronic   PONV (postoperative nausea and vomiting)    Sinus pause 02/14/2013   Syncope and collapse    echo-April 12,2012-EF 55% Echo normal; recorder with prolonged sinus pauses --> status post   Tobacco abuse    Vertigo    Past Surgical History:  Procedure Laterality Date   ABDOMINAL HYSTERECTOMY  03/19/1980   "partial"   BACK SURGERY     CERVICAL FUSION Left 11/2013   C4-C6   CERVICAL FUSION     3,4,5,   INSERT / REPLACE / REMOVE PACEMAKER  02/20/2013   medtronic   LOOP RECORDER IMPLANT N/A 11/06/2012   Procedure: LINQ LOOP RECORDER IMPLANT;  Surgeon: Sanda Klein, MD;  Location: [facility];  Service: Cardiovascular;  Laterality: N/A;   NECK SURGERY  11/17/2013   PACEMAKER PLACEMENT N/A 02/16/2013   PERMANENT PACEMAKER INSERTION N/A 02/20/2013   Procedure: PERMANENT PACEMAKER INSERTION;  Surgeon: Sanda Klein, MD;  Location: [facility];  Service: Cardiovascular;  Laterality: N/A;   POSTERIOR LUMBAR FUSION  ~ 2009   ROBOTIC ASSISTED SALPINGO OOPHERECTOMY Bilateral 11/25/2018   Procedure: XI ROBOTIC ASSISTED BILATERAL SALPINGO OOPHORECTOMY and LYSIS OF ADHESIONS.;  Surgeon: Everitt Amber, MD;  Location: [facility];  Service: Gynecology;  Laterality: Bilateral;   SPINAL FUSION  03/19/2014   TONSILLECTOMY     TRANSTHORACIC ECHOCARDIOGRAM  07/11/2010   The left atrial size is normal.There is no evidence of mitral vavle  prolaspe.Right ventriicular systolic pressure is normal . Injection of contrast documented no interatrial shunt. Essentially normal 2D echo -doppler study    Social History:  reports that she has been smoking cigarettes. She has a 30.00 pack-year smoking history. She has never used smokeless tobacco. She reports that she does not drink alcohol and does not use drugs.  Allergies  Allergen Reactions   Antibiotic Ear [Neomycin-Polymyxin-Hc]     Other reaction(s): Unknown   Penicillins Other (See Comments)    Yeast infection Did it involve swelling of the  face/tongue/throat, SOB, or low BP? No Did it involve sudden or severe rash/hives, skin peeling, or any reaction on the inside of your mouth or nose? No Did you need to seek medical attention at a hospital or doctor's office? Yes When did it last happen? More than 5 years ago  If all above answers are "NO", may proceed with cephalosporin use.     Family History  Adopted: Yes  Problem Relation Age of Onset   Heart attack Brother     Prior to Admission medications   Medication Sig Start Date End Date Taking? Authorizing Provider  acetaminophen (TYLENOL) 325 MG tablet Take 2 tablets (650 mg total) by mouth every 6 (six) hours as needed for mild pain (or Fever >/= 101). 02/26/20   Domenic Polite, MD  ALPRAZolam Duanne Moron) 1 MG tablet Take 0.5 tablets (0.5 mg total) by mouth 3 (three) times daily as needed for anxiety. 03/12/21   Raiford Noble Latif, DO  [facility] B-12 500 MCG tablet Take 500 mcg by mouth daily. 02/13/21   [provider]  hydrOXYzine (ATARAX) 50 MG tablet TAKE 1-2 TABLETS (50-100 MG TOTAL) BY MOUTH AT BEDTIME AS NEEDED. 03/27/21   Donnal Moat T, PA-C  Oxycodone HCl 10 MG TABS Take 0.5 tablets (5 mg total) by mouth 3 (three) times daily as needed (pain). 03/12/21   Raiford Noble Latif, DO  risperiDONE (RISPERDAL) 3 MG tablet Take 1 tablet (3 mg total) by mouth at bedtime. 01/26/21   Addison Lank, PA-C    Physical Exam: Vitals:   05/03/21 1549 05/03/21 1630 05/03/21 1700 05/03/21 1800  BP:  129/85 119/75 (!) 127/115  Pulse:  99 (!) 101 (!) 108  Resp:  18 (!) 21 18  Temp: 98.6 F (37 C)     TempSrc: Rectal     SpO2:  100% 97% 98%   General:  Appears calm and comfortable and is in NAD, thin appearing  Eyes:  PERRL, EOMI, normal lids, iris ENT:  grossly normal hearing, lips & tongue, mmm; appropriate dentition Neck:  no LAD, masses or thyromegaly; no carotid bruits Cardiovascular:  RRR, no m/r/g. No LE edema.  Respiratory:   CTA bilaterally with no  wheezes/rales/rhonchi.  Normal respiratory effort. Abdomen:  soft, NT, ND, NABS Back:   normal alignment, no CVAT Skin:  left eye with edema/erythema. Large abrasion to her left knee and left shoulder.  Musculoskeletal:  grossly normal tone BUE/BLE.  BUE weak 3/5 and BLE 4/5. good ROM, no bony abnormality Lower extremity:  No LE edema.  Limited foot exam with no ulcerations.  2+ distal pulses. Psychiatric:  grossly normal mood and affect, speech fluent and appropriate, AOx3 Neurologic:  CN 2-12 grossly intact, moves all extremities in coordinated fashion, sensation intact. Gait deferred    Radiological Exams on Admission: Independently reviewed - see discussion in A/P where applicable  DG Chest 2 View  Result Date: 05/03/2021 CLINICAL DATA:  Shortness of breath. EXAM:  CHEST - 2 VIEW COMPARISON:  March 08, 2021. FINDINGS: The heart size and mediastinal contours are within normal limits. Both lungs are clear. Left-sided pacemaker is unchanged in position. The visualized skeletal structures are unremarkable. IMPRESSION: No active cardiopulmonary disease. Electronically Signed   By: Marijo Conception M.D.   On: 05/03/2021 15:04   CT Head Wo Contrast  Result Date: 05/03/2021 CLINICAL DATA:  Head trauma, moderate-severe; Neck trauma, dangerous injury mechanism (Age 53-64y); Facial trauma, blunt EXAM: CT HEAD WITHOUT CONTRAST CT MAXILLOFACIAL WITHOUT CONTRAST CT CERVICAL SPINE WITHOUT CONTRAST TECHNIQUE: Multidetector CT imaging of the head, cervical spine, and maxillofacial structures were performed using the standard protocol without intravenous contrast. Multiplanar CT image reconstructions of the cervical spine and maxillofacial structures were also generated. RADIATION DOSE REDUCTION: This exam was performed according to the departmental dose-optimization program which includes automated exposure control, adjustment of the mA and/or kV according to patient size and/or use of iterative  reconstruction technique. COMPARISON:  CT head 03/08/2021. CT cervical spine 02/25/2020. FINDINGS: CT HEAD FINDINGS Brain: No evidence of acute infarction, hemorrhage, hydrocephalus, extra-axial collection or mass lesion/mass effect. Vascular: Calcific intracranial atherosclerosis. No hyperdense vessel identified. Skull: No acute fracture. Other: No mastoid effusions. CT MAXILLOFACIAL FINDINGS Osseous: No fracture or mandibular dislocation. No destructive process. Orbits: Negative. No traumatic or inflammatory finding. Sinuses: Right frontal sinus mucosal thickening with frothy secretions. Opacified right frontoethmoidal recess. Otherwise, visualized sinuses are largely clear with mild scattered mucosal thickening. Soft tissues: Left forehead/periorbital and left she edema. CT CERVICAL SPINE FINDINGS Alignment: Similar alignment.  No substantial sagittal subluxation. Skull base and vertebrae: Vertebral body heights are similar to prior. No evidence of acute fracture. C4-C6 ACDF with solid bony fusion. Soft tissues and spinal canal: No prevertebral fluid or swelling. No visible canal hematoma. Disc levels:  Similar multilevel degenerative change. Upper chest: Visualized lung apices are clear. IMPRESSION: 1. No evidence of acute intracranial abnormality. 2. Left forehead/periorbital and left cheek edema, nonspecific but presumably contusion in the setting trauma. No acute facial fracture. 3. No evidence of acute fracture or traumatic malalignment in the cervical spine. Solid C4-C6 ACDF. Electronically Signed   By: Margaretha Sheffield M.D.   On: 05/03/2021 15:25   CT Cervical Spine Wo Contrast  Result Date: 05/03/2021 CLINICAL DATA:  Head trauma, moderate-severe; Neck trauma, dangerous injury mechanism (Age 62-64y); Facial trauma, blunt EXAM: CT HEAD WITHOUT CONTRAST CT MAXILLOFACIAL WITHOUT CONTRAST CT CERVICAL SPINE WITHOUT CONTRAST TECHNIQUE: Multidetector CT imaging of the head, cervical spine, and maxillofacial  structures were performed using the standard protocol without intravenous contrast. Multiplanar CT image reconstructions of the cervical spine and maxillofacial structures were also generated. RADIATION DOSE REDUCTION: This exam was performed according to the departmental dose-optimization program which includes automated exposure control, adjustment of the mA and/or kV according to patient size and/or use of iterative reconstruction technique. COMPARISON:  CT head 03/08/2021. CT cervical spine 02/25/2020. FINDINGS: CT HEAD FINDINGS Brain: No evidence of acute infarction, hemorrhage, hydrocephalus, extra-axial collection or mass lesion/mass effect. Vascular: Calcific intracranial atherosclerosis. No hyperdense vessel identified. Skull: No acute fracture. Other: No mastoid effusions. CT MAXILLOFACIAL FINDINGS Osseous: No fracture or mandibular dislocation. No destructive process. Orbits: Negative. No traumatic or inflammatory finding. Sinuses: Right frontal sinus mucosal thickening with frothy secretions. Opacified right frontoethmoidal recess. Otherwise, visualized sinuses are largely clear with mild scattered mucosal thickening. Soft tissues: Left forehead/periorbital and left she edema. CT CERVICAL SPINE FINDINGS Alignment: Similar alignment.  No substantial sagittal subluxation. Skull base and vertebrae:  Vertebral body heights are similar to prior. No evidence of acute fracture. C4-C6 ACDF with solid bony fusion. Soft tissues and spinal canal: No prevertebral fluid or swelling. No visible canal hematoma. Disc levels:  Similar multilevel degenerative change. Upper chest: Visualized lung apices are clear. IMPRESSION: 1. No evidence of acute intracranial abnormality. 2. Left forehead/periorbital and left cheek edema, nonspecific but presumably contusion in the setting trauma. No acute facial fracture. 3. No evidence of acute fracture or traumatic malalignment in the cervical spine. Solid C4-C6 ACDF. Electronically  Signed   By: Margaretha Sheffield M.D.   On: 05/03/2021 15:25   DG Shoulder Left  Result Date: 05/03/2021 CLINICAL DATA:  Unwitnessed fall. EXAM: LEFT SHOULDER - 2+ VIEW COMPARISON:  June 07, 2013. FINDINGS: There is no evidence of fracture or dislocation. There is no evidence of arthropathy or other focal bone abnormality. Soft tissues are unremarkable. IMPRESSION: Negative. Electronically Signed   By: Marijo Conception M.D.   On: 05/03/2021 15:06   DG Knee Complete 4 Views Left  Result Date: 05/03/2021 CLINICAL DATA:  Unwitnessed fall. EXAM: LEFT KNEE - COMPLETE 4+ VIEW COMPARISON:  None. FINDINGS: No evidence of fracture, dislocation, or joint effusion. No evidence of arthropathy or other focal bone abnormality. Soft tissues are unremarkable. IMPRESSION: Negative. Electronically Signed   By: Marijo Conception M.D.   On: 05/03/2021 15:05   DG Hip Unilat W or Wo Pelvis 2-3 Views Left  Result Date: 05/03/2021 CLINICAL DATA:  Fall, pain EXAM: DG HIP (WITH OR WITHOUT PELVIS) 2-3V LEFT COMPARISON:  None. FINDINGS: There is no evidence of acute fracture. Mild osteoarthritis of the hips. Partially visualized lumbar spine fusion hardware. IMPRESSION: No evidence of acute left hip fracture. Note that if the patient is unable to bear weight and clinical suspicion for non-displaced hip fracture is significant, CT or MRI would be more sensitive. Electronically Signed   By: Maurine Simmering M.D.   On: 05/03/2021 16:17   CT Maxillofacial Wo Contrast  Result Date: 05/03/2021 CLINICAL DATA:  Head trauma, moderate-severe; Neck trauma, dangerous injury mechanism (Age 21-64y); Facial trauma, blunt EXAM: CT HEAD WITHOUT CONTRAST CT MAXILLOFACIAL WITHOUT CONTRAST CT CERVICAL SPINE WITHOUT CONTRAST TECHNIQUE: Multidetector CT imaging of the head, cervical spine, and maxillofacial structures were performed using the standard protocol without intravenous contrast. Multiplanar CT image reconstructions of the cervical spine and  maxillofacial structures were also generated. RADIATION DOSE REDUCTION: This exam was performed according to the departmental dose-optimization program which includes automated exposure control, adjustment of the mA and/or kV according to patient size and/or use of iterative reconstruction technique. COMPARISON:  CT head 03/08/2021. CT cervical spine 02/25/2020. FINDINGS: CT HEAD FINDINGS Brain: No evidence of acute infarction, hemorrhage, hydrocephalus, extra-axial collection or mass lesion/mass effect. Vascular: Calcific intracranial atherosclerosis. No hyperdense vessel identified. Skull: No acute fracture. Other: No mastoid effusions. CT MAXILLOFACIAL FINDINGS Osseous: No fracture or mandibular dislocation. No destructive process. Orbits: Negative. No traumatic or inflammatory finding. Sinuses: Right frontal sinus mucosal thickening with frothy secretions. Opacified right frontoethmoidal recess. Otherwise, visualized sinuses are largely clear with mild scattered mucosal thickening. Soft tissues: Left forehead/periorbital and left she edema. CT CERVICAL SPINE FINDINGS Alignment: Similar alignment.  No substantial sagittal subluxation. Skull base and vertebrae: Vertebral body heights are similar to prior. No evidence of acute fracture. C4-C6 ACDF with solid bony fusion. Soft tissues and spinal canal: No prevertebral fluid or swelling. No visible canal hematoma. Disc levels:  Similar multilevel degenerative change. Upper chest: Visualized lung apices  are clear. IMPRESSION: 1. No evidence of acute intracranial abnormality. 2. Left forehead/periorbital and left cheek edema, nonspecific but presumably contusion in the setting trauma. No acute facial fracture. 3. No evidence of acute fracture or traumatic malalignment in the cervical spine. Solid C4-C6 ACDF. Electronically Signed   By: Margaretha Sheffield M.D.   On: 05/03/2021 15:25    EKG: Independently reviewed.  Sinus tachycardia with rate 107; nonspecific ST  changes with no evidence of acute ischemia   Labs on Admission: I have personally reviewed the available labs and imaging studies at the time of the admission.  Pertinent labs:   CK 6126,  creatinine 1.06,  AST 105,  lactic acid: 2.3,  UA pending Wbc: 18.5   Assessment and Plan: * fall with rhabdomyloysis - (present on admission) 66 year old with history of unwitnessed fall on ground x 2 days found by her daughter and presenting to ED in rhabdomyolysis. Unsure how she fell and she can not remember -observation to telemetry -? If syncopal episode past history of syncopal episodes due to sinus pauses s/p Medtronic PPM -have asked for her PPM to be interrogated -echo ordered  -check orthostatics -can not MRI with PPM and no neurological deficits on exam. Will continue with frequent neuro checks -CK to over 6000, received 2L of IVF in ED and will continue IVF at a slower rate of 100cc/hour. Has history of diastolic dysfunction, grade 1. Repeat CK in AM  -UA with no hgb, 20 ketones.  -check mag/phos  -extremely weak on exam: PT/OT to eval -unsure if medication could be contributing. Held hydroxyzine  -did not want me to call daughter, but would verify she is moving in with her.  -check UDS  Leukocytosis- (present on admission) Likely reactive, hemoconcentrated Continue with IVF and trend cbc and follow fever curve   Abnormal liver function test- (present on admission) -AST to 105 -continue IVF and monitor   Depression with anxiety- (present on admission) Continue xanax .5mg  Tid prn   Bipolar disorder ([place])- (present on admission) Continue Risperdal   Chronic pain On oxycodone 5mg  Tid prn and PMP webside verified  Will continue this   Lactic acidosis Likely secondary to hypovolemia No signs of infection Continue IVF and trend lactic acid   Underweight- (present on admission) Nutrition consult She states her daughter is moving in with her in a couple of weeks. Would  verify this to make sure safe at home.        Advance Care Planning:   Code Status: Full Code full   Consults: none   DVT Prophylaxis: SCDs  Family Communication: none, doesn't want me calling her daughter right now.   Severity of Illness: The appropriate patient status for this patient is OBSERVATION. Observation status is judged to be reasonable and necessary in order to provide the required intensity of service to ensure the patient's safety. The patient's presenting symptoms, physical exam findings, and initial radiographic and laboratory data in the context of their medical condition is felt to place them at decreased risk for further clinical deterioration. Furthermore, it is anticipated that the patient will be medically stable for discharge from the hospital within 2 midnights of admission.   Author: Orma Flaming, MD 05/03/2021 8:14 PM  For on call review [email or web address].

## 2021-05-03 NOTE — ED Notes (Addendum)
Per PA, Pt has a Medtronic pacemaker.

## 2021-05-03 NOTE — ED Triage Notes (Signed)
BIB on [facility] after pt daughter called to report pt had unwitnessed fall in the bathroom. Per EMS, Pt last spoken to by family on Monday 2/13, prompted daughter to visit today and found on fall. pt has swollen L eye. Denies thinners. Has multiple bed sores.    Hx: pacemaker

## 2021-05-03 NOTE — ED Notes (Signed)
Patient transported to X-ray 

## 2021-05-03 NOTE — Assessment & Plan Note (Addendum)
Likely reactive, hemoconcentrated Improving

## 2021-05-03 NOTE — Assessment & Plan Note (Addendum)
Fall on floor at home x 2 days, found by her daughter and presenting to ED in rhabdomyolysis. Unsure how she fell and she can not remember -Past history of syncopal episodes due to sinus pauses s/p Medtronic PPM -Pacemaker has been interrogated without finding of any significant arrhythmia -Echo showed EF of 60 to 38%, grade 1 diastolic dysfunction. -Orthostatic vitals  boderline -can not do MRI with PPM and no neurological deficits on exam. Will continue with frequent neuro checks -Continue IV fluids for elevated CK -Polypharmacy could be the contributing factor too.  She was on oxycodone, Xanax, hydroxyzine -PT/OT recommending home health on discharge.  Patient was living alone, daughter is saying that she will come to live with her now -CK elevated in the range of 4K, will continue IV fluids for today, check level tomorrow.

## 2021-05-03 NOTE — Assessment & Plan Note (Addendum)
Nutrition consult

## 2021-05-03 NOTE — Assessment & Plan Note (Addendum)
Likely secondary to hypovolemia No signs of infection resolved

## 2021-05-03 NOTE — ED Notes (Signed)
COVID swab collected by other when Pt arrived.

## 2021-05-03 NOTE — Assessment & Plan Note (Addendum)
-  AST to 105 on admission -Likely secondary to rhabdomyolysis -continue IVF and monitor

## 2021-05-03 NOTE — ED Provider Notes (Signed)
[facility] EMERGENCY DEPARTMENT Provider Note   CSN: [id number] Arrival date & time: 05/03/21  1353     History  Chief Complaint  Patient presents with   [name] is a 66 y.o. female who presents via EMS with concern for syncopal event versus fall on Monday evening.  Patient then laid on the ground for 2 days before her daughter found her today.  Does not recall how she fell, does not recall having chest pain, palpitations, feeling of lightheadedness before fall.  Patient reports that she does not remember anything until being found by her daughter on the floor this morning.  She complains of some pain in the left hip but otherwise denies any pain or other complaints.  Found by EMS to be incontinent of urine but not stool, with multiple sores from where she was lying on the ground.  She does have a notable past medical history for pacemaker for sinus pauses, generalized weakness, cervical cancer, anxiety, bipolar disorder.   Fall      Home Medications Prior to Admission medications   Medication Sig Start Date End Date Taking? Authorizing Provider  acetaminophen (TYLENOL) 325 MG tablet Take 2 tablets (650 mg total) by mouth every 6 (six) hours as needed for mild pain (or Fever >/= 101). 02/26/20   Domenic Polite, MD  ALPRAZolam Duanne Moron) 1 MG tablet Take 0.5 tablets (0.5 mg total) by mouth 3 (three) times daily as needed for anxiety. 03/12/21   Raiford Noble Latif, DO  [employer] B-12 500 MCG tablet Take 500 mcg by mouth daily. 02/13/21   [provider]  hydrOXYzine (ATARAX) 50 MG tablet TAKE 1-2 TABLETS (50-100 MG TOTAL) BY MOUTH AT BEDTIME AS NEEDED. 03/27/21   Donnal Moat T, PA-C  Oxycodone HCl 10 MG TABS Take 0.5 tablets (5 mg total) by mouth 3 (three) times daily as needed (pain). 03/12/21   Raiford Noble Latif, DO  risperiDONE (RISPERDAL) 3 MG tablet Take 1 tablet (3 mg total) by mouth at bedtime. 01/26/21   Addison Lank, PA-C       Allergies    Antibiotic ear [neomycin-polymyxin-hc] and Penicillins    Review of Systems   Review of Systems  Musculoskeletal:  Positive for arthralgias and myalgias.  Neurological:  Positive for syncope and weakness.  All other systems reviewed and are negative.  Physical Exam Updated Vital Signs BP 125/90    Pulse (!) 102    Temp 97.9 F (36.6 C) (Oral)    Resp (!) 23    SpO2 100%  Physical Exam Vitals and nursing note reviewed.  Constitutional:      General: She is not in acute distress.    Appearance: Normal appearance.     Comments: This is an ill-appearing cachectic female laying on bed in no acute distress with several visible ulcers, wounds noted  HENT:     Head: Normocephalic and atraumatic.  Eyes:     General:        Right eye: No discharge.        Left eye: No discharge.  Cardiovascular:     Rate and Rhythm: Regular rhythm. Tachycardia present.     Heart sounds: No murmur heard.   No friction rub. No gallop.  Pulmonary:     Effort: Pulmonary effort is normal.     Breath sounds: Normal breath sounds.     Comments: Some tachypnea noted on arrival, she had no respiratory distress on my exam Abdominal:  General: Bowel sounds are normal.     Palpations: Abdomen is soft.  Skin:    General: Skin is warm and dry.     Capillary Refill: Capillary refill takes less than 2 seconds.     Comments: Patient with several pressure ulcers from where she was laying on the ground, as well as some periorbital edema, healing bruising around the left eye, and ulcer versus small healing laceration on the bridge of the nose.  Please see photos for further information.  Neurological:     Mental Status: She is alert and oriented to person, place, and time.     Comments: Patient is alert and oriented x3, but somewhat somnolent.  Cranial nerves II through XII are grossly intact.  Gait and Romberg not assessed due to weakness at this time.  She appears to have intact coordination.  She  is globally weak with no focal deficit noted.  Psychiatric:        Mood and Affect: Mood normal.        Behavior: Behavior normal.    ED Results / Procedures / Treatments   Labs (all labs ordered are listed, but only abnormal results are displayed) Labs Reviewed  CBC WITH DIFFERENTIAL/PLATELET  COMPREHENSIVE METABOLIC PANEL  CK  LACTIC ACID, PLASMA  LACTIC ACID, PLASMA  URINALYSIS, ROUTINE W REFLEX MICROSCOPIC  APTT  PROTIME-INR  CBG MONITORING, ED  TROPONIN I (HIGH SENSITIVITY)    EKG None  Radiology DG Chest 2 View  Result Date: 05/03/2021 CLINICAL DATA:  Shortness of breath. EXAM: CHEST - 2 VIEW COMPARISON:  March 08, 2021. FINDINGS: The heart size and mediastinal contours are within normal limits. Both lungs are clear. Left-sided pacemaker is unchanged in position. The visualized skeletal structures are unremarkable. IMPRESSION: No active cardiopulmonary disease. Electronically Signed   By: Marijo Conception M.D.   On: 05/03/2021 15:04   DG Shoulder Left  Result Date: 05/03/2021 CLINICAL DATA:  Unwitnessed fall. EXAM: LEFT SHOULDER - 2+ VIEW COMPARISON:  June 07, 2013. FINDINGS: There is no evidence of fracture or dislocation. There is no evidence of arthropathy or other focal bone abnormality. Soft tissues are unremarkable. IMPRESSION: Negative. Electronically Signed   By: Marijo Conception M.D.   On: 05/03/2021 15:06   DG Knee Complete 4 Views Left  Result Date: 05/03/2021 CLINICAL DATA:  Unwitnessed fall. EXAM: LEFT KNEE - COMPLETE 4+ VIEW COMPARISON:  None. FINDINGS: No evidence of fracture, dislocation, or joint effusion. No evidence of arthropathy or other focal bone abnormality. Soft tissues are unremarkable. IMPRESSION: Negative. Electronically Signed   By: Marijo Conception M.D.   On: 05/03/2021 15:05    Procedures Procedures    Medications Ordered in ED Medications  sodium chloride 0.9 % bolus 1,000 mL (has no administration in time range)    ED Course/  Medical Decision Making/ A&P                           Medical Decision Making Amount and/or Complexity of Data Reviewed Labs: ordered. Radiology: ordered. ECG/medicine tests: ordered.   This patient presents to the ED for concern of syncope versus mechanical fall, collapse, weakness, having been on the ground for 2 days without moving, this involves an extensive number of treatment options, and is a complaint that carries with it a high risk of complications and morbidity. The emergent differential diagnosis prior to evaluation includes, but is not limited to, cardiac arrhythmia, stroke, other cardiac  abnormality putting ACS the cause of patient's syncope, generalized weakness, protein calorie malnutrition, acute kidney injury, rhabdomyolysis, as well as acute injury from her fall.   Past Medical History / Co-morbidities: Pacemaker for sinus pause, generalized weakness, cervical cancer, anxiety, bipolar disorder.  Additional history: Additional history obtained from EMS. External records from outside source obtained and reviewed including outpatient orthopedic, behavioral health notes..  Physical Exam: Physical exam performed. The pertinent findings include: Significant pressure ulcers noted as can be seen in photos and physical exam.  Swelling around the left eye.  Patient is tachycardic.  Otherwise she appears very thin, malnourished.  No acute distress noted.  Afebrile on initial presentation.  Patient is still pending lab work, and advanced imaging at this time.  Plain film radiographs of the left shoulder, left knee, left shoulder, and chest were ordered, independently interpreted by me, showed no fractures.  She is still pending radiographic imaging of the hips, and CT scans.  3:25 PM Care of AMAAL DIMARTINO transferred to [facility] and Dr. Melina Copa at the end of my shift as the patient will require reassessment once labs/imaging have resulted. Patient presentation, ED course, and  plan of care discussed with review of all pertinent labs and imaging. Please see his/her note for further details regarding further ED course and disposition. Plan at time of handoff is interrogate pacemaker, evaluate for causes of syncope, evaluate for acute bony injuries, intracranial abnormality, as well as evaluate for acute kidney injury, rhabdomyolysis with labs. This may be altered or completely changed at the discretion of the oncoming team pending results of further workup.  Final Clinical Impression(s) / ED Diagnoses Final diagnoses:  None    Rx / DC Orders ED Discharge Orders     None         Dorien Chihuahua 05/03/21 1525    Kommor, Debe Coder, MD 05/03/21 1615

## 2021-05-03 NOTE — Assessment & Plan Note (Signed)
Continue Risperdal.

## 2021-05-03 NOTE — Assessment & Plan Note (Addendum)
Continue xanax .5mg  Tid prn .  She was seen by psychiatry on last hospitalization and recommended to continue these medicine on DC

## 2021-05-04 ENCOUNTER — Encounter (HOSPITAL_COMMUNITY): Payer: Self-pay | Admitting: Family Medicine

## 2021-05-04 ENCOUNTER — Other Ambulatory Visit: Payer: Self-pay

## 2021-05-04 ENCOUNTER — Observation Stay (HOSPITAL_COMMUNITY): Payer: Medicare Other

## 2021-05-04 DIAGNOSIS — G894 Chronic pain syndrome: Secondary | ICD-10-CM | POA: Diagnosis present

## 2021-05-04 DIAGNOSIS — R55 Syncope and collapse: Secondary | ICD-10-CM

## 2021-05-04 DIAGNOSIS — N179 Acute kidney failure, unspecified: Secondary | ICD-10-CM | POA: Diagnosis present

## 2021-05-04 DIAGNOSIS — E860 Dehydration: Secondary | ICD-10-CM | POA: Diagnosis present

## 2021-05-04 DIAGNOSIS — M6282 Rhabdomyolysis: Secondary | ICD-10-CM | POA: Diagnosis not present

## 2021-05-04 DIAGNOSIS — Z8541 Personal history of malignant neoplasm of cervix uteri: Secondary | ICD-10-CM | POA: Diagnosis not present

## 2021-05-04 DIAGNOSIS — D72829 Elevated white blood cell count, unspecified: Secondary | ICD-10-CM | POA: Diagnosis present

## 2021-05-04 DIAGNOSIS — R4182 Altered mental status, unspecified: Secondary | ICD-10-CM

## 2021-05-04 DIAGNOSIS — W1830XA Fall on same level, unspecified, initial encounter: Secondary | ICD-10-CM | POA: Diagnosis present

## 2021-05-04 DIAGNOSIS — Z880 Allergy status to penicillin: Secondary | ICD-10-CM | POA: Diagnosis not present

## 2021-05-04 DIAGNOSIS — Z90711 Acquired absence of uterus with remaining cervical stump: Secondary | ICD-10-CM | POA: Diagnosis not present

## 2021-05-04 DIAGNOSIS — E440 Moderate protein-calorie malnutrition: Secondary | ICD-10-CM | POA: Diagnosis present

## 2021-05-04 DIAGNOSIS — E876 Hypokalemia: Secondary | ICD-10-CM | POA: Diagnosis present

## 2021-05-04 DIAGNOSIS — I495 Sick sinus syndrome: Secondary | ICD-10-CM | POA: Diagnosis not present

## 2021-05-04 DIAGNOSIS — Z881 Allergy status to other antibiotic agents status: Secondary | ICD-10-CM | POA: Diagnosis not present

## 2021-05-04 DIAGNOSIS — E878 Other disorders of electrolyte and fluid balance, not elsewhere classified: Secondary | ICD-10-CM

## 2021-05-04 DIAGNOSIS — Z79899 Other long term (current) drug therapy: Secondary | ICD-10-CM | POA: Diagnosis not present

## 2021-05-04 DIAGNOSIS — E861 Hypovolemia: Secondary | ICD-10-CM | POA: Diagnosis present

## 2021-05-04 DIAGNOSIS — Z950 Presence of cardiac pacemaker: Secondary | ICD-10-CM | POA: Diagnosis not present

## 2021-05-04 DIAGNOSIS — R64 Cachexia: Secondary | ICD-10-CM | POA: Diagnosis present

## 2021-05-04 DIAGNOSIS — F419 Anxiety disorder, unspecified: Secondary | ICD-10-CM | POA: Diagnosis present

## 2021-05-04 DIAGNOSIS — E872 Acidosis, unspecified: Secondary | ICD-10-CM | POA: Diagnosis present

## 2021-05-04 DIAGNOSIS — F319 Bipolar disorder, unspecified: Secondary | ICD-10-CM | POA: Diagnosis present

## 2021-05-04 DIAGNOSIS — Z20822 Contact with and (suspected) exposure to covid-19: Secondary | ICD-10-CM | POA: Diagnosis present

## 2021-05-04 DIAGNOSIS — T796XXA Traumatic ischemia of muscle, initial encounter: Secondary | ICD-10-CM | POA: Diagnosis present

## 2021-05-04 DIAGNOSIS — G9341 Metabolic encephalopathy: Secondary | ICD-10-CM | POA: Diagnosis present

## 2021-05-04 DIAGNOSIS — F17210 Nicotine dependence, cigarettes, uncomplicated: Secondary | ICD-10-CM | POA: Diagnosis present

## 2021-05-04 DIAGNOSIS — Z681 Body mass index (BMI) 19 or less, adult: Secondary | ICD-10-CM | POA: Diagnosis not present

## 2021-05-04 LAB — BASIC METABOLIC PANEL
Anion gap: 7 (ref 5–15)
BUN: 13 mg/dL (ref 8–23)
CO2: 16 mmol/L — ABNORMAL LOW (ref 22–32)
Calcium: 6.6 mg/dL — ABNORMAL LOW (ref 8.9–10.3)
Chloride: 118 mmol/L — ABNORMAL HIGH (ref 98–111)
Creatinine, Ser: 0.56 mg/dL (ref 0.44–1.00)
GFR, Estimated: >60 mL/min (ref >60)
Glucose, Bld: 85 mg/dL (ref 70–99)
Potassium: 3.2 mmol/L — ABNORMAL LOW (ref 3.5–5.1)
Sodium: 141 mmol/L (ref 135–145)

## 2021-05-04 LAB — ECHOCARDIOGRAM COMPLETE
Area-P 1/2: 3.48 cm2
Calc EF: 60.7 %
Height: 68 in
S' Lateral: 2.55 cm
Single Plane A2C EF: 59.6 %
Single Plane A4C EF: 60.1 %
Weight: 1920 oz

## 2021-05-04 LAB — CBC
HCT: 38.5 % (ref 36.0–46.0)
Hemoglobin: 12 g/dL (ref 12.0–15.0)
MCH: 28.2 pg (ref 26.0–34.0)
MCHC: 31.2 g/dL (ref 30.0–36.0)
MCV: 90.4 fL (ref 80.0–100.0)
Platelets: 229 10*3/uL (ref 150–400)
RBC: 4.26 MIL/uL (ref 3.87–5.11)
RDW: 18.1 % — ABNORMAL HIGH (ref 11.5–15.5)
WBC: 14 10*3/uL — ABNORMAL HIGH (ref 4.0–10.5)
nRBC: 0 % (ref 0.0–0.2)

## 2021-05-04 LAB — RAPID URINE DRUG SCREEN, HOSP PERFORMED
Amphetamines: NOT DETECTED
Barbiturates: NOT DETECTED
Benzodiazepines: POSITIVE — AB
Cocaine: NOT DETECTED
Opiates: POSITIVE — AB
Tetrahydrocannabinol: NOT DETECTED

## 2021-05-04 LAB — CK: Total CK: 4026 U/L — ABNORMAL HIGH (ref 38–234)

## 2021-05-04 MED ORDER — CALCIUM GLUCONATE-NACL 1-0.675 GM/50ML-% IV SOLN
1.0000 g | Freq: Once | INTRAVENOUS | Status: AC
  Administered 2021-05-04: 1000 mg via INTRAVENOUS
  Filled 2021-05-04: qty 50

## 2021-05-04 MED ORDER — POTASSIUM CHLORIDE CRYS ER 20 MEQ PO TBCR
40.0000 meq | EXTENDED_RELEASE_TABLET | Freq: Once | ORAL | Status: AC
Start: 2021-05-04 — End: 2021-05-04
  Administered 2021-05-04: 40 meq via ORAL
  Filled 2021-05-04: qty 2

## 2021-05-04 NOTE — Evaluation (Signed)
Physical Therapy Evaluation Patient Details Name: [name] MRN: [id number] DOB: 02/01/1956 Today's Date: 05/04/2021  History of Present Illness  [name] is a 66 y.o. female with medical history significant of anxiety, bipolar disorder, depression, sinus pauses s/p PPM, cervical cancer, chronic pain with h/o back and neck surgery who was admitted after found on the floor by her daughter where she was for likely 2 days due to syncope versus fall.  Clinical Impression  Patient presents with decreased mobility due to generalized weakness, tight shoulders and elbows in the UE's, decreased balance and decreased safety awareness with some disorientation.  Patient previously lived alone in second floor apartment and reports she drove and carried groceries up the steps.  She needs min A for bed mobility and up to mod A for ambulation with RW for safety and walker management.  She will need close 24 hour assist available at d/c.  Pt reports her daughter can provide this as she does not work.  Unable to locate number for daughter to call and confirm.  PT will follow up acutely if not d/c.        Recommendations for follow up therapy are one component of a multi-disciplinary discharge planning process, led by the attending physician.  Recommendations may be updated based on patient status, additional functional criteria and insurance authorization.  Follow Up Recommendations Home health PT    Assistance Recommended at Discharge Frequent or constant Supervision/Assistance  Patient can return home with the following  A little help with walking and/or transfers;A little help with bathing/dressing/bathroom;Assistance with cooking/housework;Direct supervision/assist for medications management;Assist for transportation;Help with stairs or ramp for entrance    Equipment Recommendations BSC/3in1  Recommendations for Other Services       Functional Status Assessment Patient has had a recent decline  in their functional status and demonstrates the ability to make significant improvements in function in a reasonable and predictable amount of time.     Precautions / Restrictions Precautions Precautions: Fall      Mobility  Bed Mobility Overal bed mobility: Needs Assistance Bed Mobility: Supine to Sit, Sit to Supine     Supine to sit: Min assist, HOB elevated Sit to supine: Min assist, +2 for safety/equipment   General bed mobility comments: assist for trunk upright to sit, to supine assist for legs and to ensure head did not hit rail on stretcher; cues for positioning in supine    Transfers Overall transfer level: Needs assistance Equipment used: Rolling walker (2 wheels) Transfers: Sit to/from Stand Sit to Stand: Min assist           General transfer comment: up from stretcher to RW pulls up on walker and min A for balance    Ambulation/Gait Ambulation/Gait assistance: Min assist, Mod assist Gait Distance (Feet): 65 Feet Assistive device: Rolling walker (2 wheels) Gait Pattern/deviations: Step-to pattern, Decreased stride length, Step-through pattern, Narrow base of support, Shuffle, Knee flexed in stance - right, Knee flexed in stance - left       General Gait Details: short shuffling steps with narrow BOS, help to maneuver walker on turns and around obstacles on R side, pt needing support for balance even with RW with knees flexed duing gait  Stairs            Wheelchair Mobility    Modified Rankin (Stroke Patients Only)       Balance Overall balance assessment: Needs assistance Sitting-balance support: Feet supported Sitting balance-Leahy Scale: Poor Sitting balance - Comments:  initially leaning backwards on EOB, assist for balance initially, then with S   Standing balance support: Bilateral upper extremity supported, Reliant on assistive device for balance Standing balance-Leahy Scale: Poor                               Pertinent  Vitals/Pain Pain Assessment Pain Assessment: Faces Faces Pain Scale: Hurts little more Pain Location: elbows with extension Pain Descriptors / Indicators: Grimacing, Sore Pain Intervention(s): Monitored during session, Repositioned    Home Living Family/patient expects to be discharged to:: Private residence Living Arrangements: Children;Alone Available Help at Discharge: Family;Available 24 hours/day Type of Home: Apartment Home Access: Stairs to enter Entrance Stairs-Rails: Right Entrance Stairs-Number of Steps: flight   Home Layout: One level   Additional Comments: reports daughter moving in but not for couple weeks    Prior Function Prior Level of Function : Independent/Modified Independent                     Hand Dominance   Dominant Hand: Right    Extremity/Trunk Assessment   Upper Extremity Assessment Upper Extremity Assessment: RUE deficits/detail;LUE deficits/detail RUE Deficits / Details: AAROM tight shoulder elevation and elbow extension can get straight with time and repitition, but pt reports some pain with red rubbed area under R elbow, strength elbow flexion 4-/5, elbow extension 3-/5, shoulder elevation 2+/5 RUE Coordination: decreased gross motor LUE Deficits / Details: AAROM tight shoulder elevation and elbow extension can get straight with time and repitition, but pt reports some pain with red rubbed area under R elbow, strength elbow flexion 4-/5, elbow extension 3-/5, shoulder elevation 2+/5 LUE Coordination: decreased gross motor    Lower Extremity Assessment Lower Extremity Assessment: RLE deficits/detail;LLE deficits/detail RLE Deficits / Details: AROM WFL, strength hip flexion 3+/5, knee extension 4/5, ankle DF 4/5 LLE Deficits / Details: AROM WFL, strength hip flexion 3+/5, knee extension 4/5, ankle DF 4/5    Cervical / Trunk Assessment Cervical / Trunk Assessment: Other exceptions Cervical / Trunk Exceptions: generalized stiffness in  spine  Communication   Communication: No difficulties  Cognition Arousal/Alertness: Awake/alert Behavior During Therapy: Flat affect Overall Cognitive Status: Impaired/Different from baseline Area of Impairment: Orientation, Safety/judgement, Problem solving                 Orientation Level: Time, Disoriented to       Safety/Judgement: Decreased awareness of deficits, Decreased awareness of safety   Problem Solving: Slow processing, Requires verbal cues General Comments: stated it was October, knew it was 2023, at [facility] and had a fall        General Comments General comments (skin integrity, edema, etc.): Educated significantly on need for daughter to be right beside her when getting up to walk and will need assist at entry up steps to apartment    Exercises General Exercises - Upper Extremity Elbow Extension: AAROM, Both, 5 reps, Supine   Assessment/Plan    PT Assessment    PT Problem List         PT Treatment Interventions      PT Goals (Current goals can be found in the Care Plan section)  Acute Rehab PT Goals Patient Stated Goal: to return home with daughter assist PT Goal Formulation: With patient Time For Goal Achievement: 05/18/21 Potential to Achieve Goals: Fair    Frequency Min 3X/week     Co-evaluation  AM-PAC PT "6 Clicks" Mobility  Outcome Measure Help needed turning from your back to your side while in a flat bed without using bedrails?: A Little Help needed moving from lying on your back to sitting on the side of a flat bed without using bedrails?: A Little Help needed moving to and from a bed to a chair (including a wheelchair)?: A Little Help needed standing up from a chair using your arms (e.g., wheelchair or bedside chair)?: A Little Help needed to walk in hospital room?: A Lot Help needed climbing 3-5 steps with a railing? : A Lot 6 Click Score: 16    End of Session Equipment Utilized During Treatment: Gait  belt Activity Tolerance: Patient tolerated treatment well Patient left: in bed;with call bell/phone within reach Nurse Communication: Other (comment) (in bed rail down with breakfast tray) PT Visit Diagnosis: Other abnormalities of gait and mobility (R26.89);History of falling (Z91.81);Other symptoms and signs involving the nervous system (R29.898)    Time: 9311-2162 PT Time Calculation (min) (ACUTE ONLY): 34 min   Charges:   PT Evaluation $PT Eval Moderate Complexity: 1 Mod PT Treatments $Gait Training: 8-22 mins        Magda Kiel, PT Acute Rehabilitation Services [phone] Office:[phone] 05/04/2021   Reginia Naas 05/04/2021, 11:41 AM

## 2021-05-04 NOTE — ED Notes (Addendum)
[employer] called with report-  05/02/21 for 16 minutes at 1025- fast ventricular rate  684 atrial rate episodes since 02/2014 88 episodes ventricular rate episodes since 02/2014  2/15 rate drop- support pacing noticed

## 2021-05-04 NOTE — Progress Notes (Signed)
Pt admitted to 6n27 from ED via stretcher. Cardiac monitoring initiated and 2 RN verified skin assessment.  Significant abrasions on left shoulder, left side of face, left knee. Left orbit is edematous. Pt oriented to self and place and day.

## 2021-05-04 NOTE — Progress Notes (Addendum)
PROGRESS NOTE  [name]  DGU:[id number] DOB: [birth year] DOA: 05/03/2021 PCP: Lujean Amel, MD   Brief Narrative: Patient is a 66 year old female with history of anxiety, bipolar disorder, depression, sinus pauses status post pacemaker placement, cervical cancer, chronic pain syndrome who presented to the ED after a fall at home.  She apparently fell 2 days ago and was on the ground for 2 days.  She does not remember any events preceding the fall.  She could not get up.  Her daughter found her on the floor and called 911.  She takes oxycodone and Xanax at home.  On condition she was tachycardic.  Lab work showed elevated CK, elevated LFT, leukocytosis.  Skeletal survey, CT head, cervical spine did not show any acute fracture or dislocation or any other acute abnormalities.  Patient was started on IV fluids, PT/OT ordered   Assessment & Plan:  Principal Problem:   fall with rhabdomyloysis  Active Problems:   Altered mental status   Bipolar disorder ([place])   Depression with anxiety   Abnormal liver function test   Fall   Leukocytosis   Chronic pain   Underweight   Lactic acidosis   Electrolyte abnormality   Assessment and Plan: * fall with rhabdomyloysis - (present on admission) Fall on floor at home x 2 days, found by her daughter and presenting to ED in rhabdomyolysis. Unsure how she fell and she can not remember -Past history of syncopal episodes due to sinus pauses s/p [employer] PPM -Pacemaker has been interrogated without finding of any significant arrhythmia -echo ordered, pending results -Orthostatic vitals  boderline -can not do MRI with PPM and no neurological deficits on exam. Will continue with frequent neuro checks -Continue IV fluids for elevated CK -extremely weak on exam: PT/OT to eval -Polypharmacy could be the contributing factor to.  She was on oxycodone, Xanax, hydroxyzine   Altered mental status Patient was admitted here on December 2022 with the same  problem : she was found on the floor and confused.  At that time psychiatry and neurology were consulted.  She had some improvement in the mental status and was sent back to home with home health.  Vitamin B12 was normal.  CT head did not show any acute intracranial abnormalities CT head done on this admission also did not show any acute problems.  As per the daughter, she has been confused and forgetful intermittently.  She might have developed new onset dementia.  Continue monitor mental status.   Electrolyte abnormality Noted to be hypokalemic and hypocalcemic today.  Supplemented.  Check levels tomorrow  Lactic acidosis Likely secondary to hypovolemia No signs of infection Continue IVF and trend lactic acid   Underweight- (present on admission) Nutrition consult   Chronic pain On oxycodone 5mg  Tid prn and PMP webside verified  Will continue this   Leukocytosis- (present on admission) Likely reactive, hemoconcentrated Improving  Abnormal liver function test- (present on admission) -AST to 105 on admission -Likely secondary to rhabdomyolysis -continue IVF and monitor   Depression with anxiety- (present on admission) Continue xanax .5mg  Tid prn .  She was seen by psychiatry on last hospitalization and recommended to continue these medicine on DC  Bipolar disorder [facility])- (present on admission) Continue Risperdal               DVT prophylaxis:SCDs Start: 05/03/21 1949     Code Status: Full Code  Family Communication: Daughter on phone on 2/16  Patient status:Obs  Patient is from :Home  Anticipated discharge to: Home versus skilled nursing facility  Estimated DC date: 1 to 2 days   Consultants: None  Procedures: None  Antimicrobials:  Anti-infectives (From admission, onward)    None       Subjective: Patient seen and examined at the bedside this morning.  Hemodynamically stable.  Lying in bed.  Confused, not agitated.  Bruises on the left side of  the face.  Denies any complaints.  Knew that she is in the hospital but not oriented to time.  Had a long discussion with the daughter on phone.  Daughter states she is in the process to move in with her mother so that she can live with her  Objective: Vitals:   05/04/21 1045 05/04/21 1100 05/04/21 1106 05/04/21 1127  BP: 114/80 115/85  118/68  Pulse: (!) 104 88  78  Resp: 18 18  20   Temp:   98.5 F (36.9 C) 98.8 F (37.1 C)  TempSrc:   Oral Oral  SpO2: 95% 95%    Weight:      Height:        Intake/Output Summary (Last 24 hours) at 05/04/2021 1138 Last data filed at 05/04/2021 1021 Gross per 24 hour  Intake 1067.75 ml  Output --  Net 1067.75 ml   Filed Weights   05/04/21 0729  Weight: 54.4 kg    Examination:  General exam: Very deconditioned, chronically looking HEENT: Bruise on the left side of the face, left periorbital laceration with suture Respiratory system:  no wheezes or crackles  Cardiovascular system: S1 & S2 heard, RRR.  Gastrointestinal system: Abdomen is nondistended, soft and nontender. Central nervous system: Alert and awake, oriented to place only Extremities: No edema, no clubbing ,no cyanosis Skin: No rashes, no ulcers,no icterus     Data Reviewed: I have personally reviewed following labs and imaging studies  CBC: Recent Labs  Lab 05/03/21 1522 05/04/21 0445  WBC 18.5* 14.0*  NEUTROABS 16.0*  --   HGB 16.7* 12.0  HCT 52.1* 38.5  MCV 87.3 90.4  PLT 358 782   Basic Metabolic Panel: Recent Labs  Lab 05/03/21 1522 05/03/21 1949 05/04/21 0445  NA 143  --  141  K 3.8  --  3.2*  CL 107  --  118*  CO2 23  --  16*  GLUCOSE 111*  --  85  BUN 21  --  13  CREATININE 1.06*  --  0.56  CALCIUM 9.4  --  6.6*  MG  --  2.0  --   PHOS  --  3.1  --      Recent Results (from the past 240 hour(s))  Resp Panel by RT-PCR (Flu A&B, Covid) Nasopharyngeal Swab     Status: None   Collection Time: 05/03/21  2:25 PM   Specimen: Nasopharyngeal Swab;  Nasopharyngeal(NP) swabs in vial transport medium  Result Value Ref Range Status   SARS Coronavirus 2 by RT PCR NEGATIVE NEGATIVE Final    Comment: (NOTE) SARS-CoV-2 target nucleic acids are NOT DETECTED.  The SARS-CoV-2 RNA is generally detectable in upper respiratory specimens during the acute phase of infection. The lowest concentration of SARS-CoV-2 viral copies this assay can detect is 138 copies/mL. A negative result does not preclude SARS-Cov-2 infection and should not be used as the sole basis for treatment or other patient management decisions. A negative result may occur with  improper specimen collection/handling, submission of specimen other than nasopharyngeal swab, presence of viral mutation(s) within the areas targeted by  this assay, and inadequate number of viral copies(<138 copies/mL). A negative result must be combined with clinical observations, patient history, and epidemiological information. The expected result is Negative.  Fact Sheet for Patients:  EntrepreneurPulse.com.au  Fact Sheet for Healthcare Providers:  IncredibleEmployment.be  This test is no t yet approved or cleared by the [place] FDA and  has been authorized for detection and/or diagnosis of SARS-CoV-2 by FDA under an Emergency Use Authorization (EUA). This EUA will remain  in effect (meaning this test can be used) for the duration of the COVID-19 declaration under Section 564(b)(1) of the Act, 21 U.S.C.section 360bbb-3(b)(1), unless the authorization is terminated  or revoked sooner.       Influenza A by PCR NEGATIVE NEGATIVE Final   Influenza B by PCR NEGATIVE NEGATIVE Final    Comment: (NOTE) The Xpert Xpress SARS-CoV-2/FLU/RSV plus assay is intended as an aid in the diagnosis of influenza from Nasopharyngeal swab specimens and should not be used as a sole basis for treatment. Nasal washings and aspirates are unacceptable for Xpert Xpress  SARS-CoV-2/FLU/RSV testing.  Fact Sheet for Patients: EntrepreneurPulse.com.au  Fact Sheet for Healthcare Providers: IncredibleEmployment.be  This test is not yet approved or cleared by the [place] FDA and has been authorized for detection and/or diagnosis of SARS-CoV-2 by FDA under an Emergency Use Authorization (EUA). This EUA will remain in effect (meaning this test can be used) for the duration of the COVID-19 declaration under Section 564(b)(1) of the Act, 21 U.S.C. section 360bbb-3(b)(1), unless the authorization is terminated or revoked.  Performed at [facility], [place] [address], [place], [place] [address]      Radiology Studies: DG Chest 2 View  Result Date: 05/03/2021 CLINICAL DATA:  Shortness of breath. EXAM: CHEST - 2 VIEW COMPARISON:  March 08, 2021. FINDINGS: The heart size and mediastinal contours are within normal limits. Both lungs are clear. Left-sided pacemaker is unchanged in position. The visualized skeletal structures are unremarkable. IMPRESSION: No active cardiopulmonary disease. Electronically Signed   By: Marijo Conception M.D.   On: 05/03/2021 15:04   CT Head Wo Contrast  Result Date: 05/03/2021 CLINICAL DATA:  Head trauma, moderate-severe; Neck trauma, dangerous injury mechanism (Age 45-64y); Facial trauma, blunt EXAM: CT HEAD WITHOUT CONTRAST CT MAXILLOFACIAL WITHOUT CONTRAST CT CERVICAL SPINE WITHOUT CONTRAST TECHNIQUE: Multidetector CT imaging of the head, cervical spine, and maxillofacial structures were performed using the standard protocol without intravenous contrast. Multiplanar CT image reconstructions of the cervical spine and maxillofacial structures were also generated. RADIATION DOSE REDUCTION: This exam was performed according to the departmental dose-optimization program which includes automated exposure control, adjustment of the mA and/or kV according to patient size and/or use of iterative  reconstruction technique. COMPARISON:  CT head 03/08/2021. CT cervical spine 02/25/2020. FINDINGS: CT HEAD FINDINGS Brain: No evidence of acute infarction, hemorrhage, hydrocephalus, extra-axial collection or mass lesion/mass effect. Vascular: Calcific intracranial atherosclerosis. No hyperdense vessel identified. Skull: No acute fracture. Other: No mastoid effusions. CT MAXILLOFACIAL FINDINGS Osseous: No fracture or mandibular dislocation. No destructive process. Orbits: Negative. No traumatic or inflammatory finding. Sinuses: Right frontal sinus mucosal thickening with frothy secretions. Opacified right frontoethmoidal recess. Otherwise, visualized sinuses are largely clear with mild scattered mucosal thickening. Soft tissues: Left forehead/periorbital and left she edema. CT CERVICAL SPINE FINDINGS Alignment: Similar alignment.  No substantial sagittal subluxation. Skull base and vertebrae: Vertebral body heights are similar to prior. No evidence of acute fracture. C4-C6 ACDF with solid bony fusion. Soft tissues and spinal  canal: No prevertebral fluid or swelling. No visible canal hematoma. Disc levels:  Similar multilevel degenerative change. Upper chest: Visualized lung apices are clear. IMPRESSION: 1. No evidence of acute intracranial abnormality. 2. Left forehead/periorbital and left cheek edema, nonspecific but presumably contusion in the setting trauma. No acute facial fracture. 3. No evidence of acute fracture or traumatic malalignment in the cervical spine. Solid C4-C6 ACDF. Electronically Signed   By: Margaretha Sheffield M.D.   On: 05/03/2021 15:25   CT Cervical Spine Wo Contrast  Result Date: 05/03/2021 CLINICAL DATA:  Head trauma, moderate-severe; Neck trauma, dangerous injury mechanism (Age 39-64y); Facial trauma, blunt EXAM: CT HEAD WITHOUT CONTRAST CT MAXILLOFACIAL WITHOUT CONTRAST CT CERVICAL SPINE WITHOUT CONTRAST TECHNIQUE: Multidetector CT imaging of the head, cervical spine, and maxillofacial  structures were performed using the standard protocol without intravenous contrast. Multiplanar CT image reconstructions of the cervical spine and maxillofacial structures were also generated. RADIATION DOSE REDUCTION: This exam was performed according to the departmental dose-optimization program which includes automated exposure control, adjustment of the mA and/or kV according to patient size and/or use of iterative reconstruction technique. COMPARISON:  CT head 03/08/2021. CT cervical spine 02/25/2020. FINDINGS: CT HEAD FINDINGS Brain: No evidence of acute infarction, hemorrhage, hydrocephalus, extra-axial collection or mass lesion/mass effect. Vascular: Calcific intracranial atherosclerosis. No hyperdense vessel identified. Skull: No acute fracture. Other: No mastoid effusions. CT MAXILLOFACIAL FINDINGS Osseous: No fracture or mandibular dislocation. No destructive process. Orbits: Negative. No traumatic or inflammatory finding. Sinuses: Right frontal sinus mucosal thickening with frothy secretions. Opacified right frontoethmoidal recess. Otherwise, visualized sinuses are largely clear with mild scattered mucosal thickening. Soft tissues: Left forehead/periorbital and left she edema. CT CERVICAL SPINE FINDINGS Alignment: Similar alignment.  No substantial sagittal subluxation. Skull base and vertebrae: Vertebral body heights are similar to prior. No evidence of acute fracture. C4-C6 ACDF with solid bony fusion. Soft tissues and spinal canal: No prevertebral fluid or swelling. No visible canal hematoma. Disc levels:  Similar multilevel degenerative change. Upper chest: Visualized lung apices are clear. IMPRESSION: 1. No evidence of acute intracranial abnormality. 2. Left forehead/periorbital and left cheek edema, nonspecific but presumably contusion in the setting trauma. No acute facial fracture. 3. No evidence of acute fracture or traumatic malalignment in the cervical spine. Solid C4-C6 ACDF. Electronically  Signed   By: Margaretha Sheffield M.D.   On: 05/03/2021 15:25   DG Shoulder Left  Result Date: 05/03/2021 CLINICAL DATA:  Unwitnessed fall. EXAM: LEFT SHOULDER - 2+ VIEW COMPARISON:  June 07, 2013. FINDINGS: There is no evidence of fracture or dislocation. There is no evidence of arthropathy or other focal bone abnormality. Soft tissues are unremarkable. IMPRESSION: Negative. Electronically Signed   By: Marijo Conception M.D.   On: 05/03/2021 15:06   DG Knee Complete 4 Views Left  Result Date: 05/03/2021 CLINICAL DATA:  Unwitnessed fall. EXAM: LEFT KNEE - COMPLETE 4+ VIEW COMPARISON:  None. FINDINGS: No evidence of fracture, dislocation, or joint effusion. No evidence of arthropathy or other focal bone abnormality. Soft tissues are unremarkable. IMPRESSION: Negative. Electronically Signed   By: Marijo Conception M.D.   On: 05/03/2021 15:05   DG Hip Unilat W or Wo Pelvis 2-3 Views Left  Result Date: 05/03/2021 CLINICAL DATA:  Fall, pain EXAM: DG HIP (WITH OR WITHOUT PELVIS) 2-3V LEFT COMPARISON:  None. FINDINGS: There is no evidence of acute fracture. Mild osteoarthritis of the hips. Partially visualized lumbar spine fusion hardware. IMPRESSION: No evidence of acute left hip fracture. Note that  if the patient is unable to bear weight and clinical suspicion for non-displaced hip fracture is significant, CT or MRI would be more sensitive. Electronically Signed   By: Maurine Simmering M.D.   On: 05/03/2021 16:17   CT Maxillofacial Wo Contrast  Result Date: 05/03/2021 CLINICAL DATA:  Head trauma, moderate-severe; Neck trauma, dangerous injury mechanism (Age 91-64y); Facial trauma, blunt EXAM: CT HEAD WITHOUT CONTRAST CT MAXILLOFACIAL WITHOUT CONTRAST CT CERVICAL SPINE WITHOUT CONTRAST TECHNIQUE: Multidetector CT imaging of the head, cervical spine, and maxillofacial structures were performed using the standard protocol without intravenous contrast. Multiplanar CT image reconstructions of the cervical spine and  maxillofacial structures were also generated. RADIATION DOSE REDUCTION: This exam was performed according to the departmental dose-optimization program which includes automated exposure control, adjustment of the mA and/or kV according to patient size and/or use of iterative reconstruction technique. COMPARISON:  CT head 03/08/2021. CT cervical spine 02/25/2020. FINDINGS: CT HEAD FINDINGS Brain: No evidence of acute infarction, hemorrhage, hydrocephalus, extra-axial collection or mass lesion/mass effect. Vascular: Calcific intracranial atherosclerosis. No hyperdense vessel identified. Skull: No acute fracture. Other: No mastoid effusions. CT MAXILLOFACIAL FINDINGS Osseous: No fracture or mandibular dislocation. No destructive process. Orbits: Negative. No traumatic or inflammatory finding. Sinuses: Right frontal sinus mucosal thickening with frothy secretions. Opacified right frontoethmoidal recess. Otherwise, visualized sinuses are largely clear with mild scattered mucosal thickening. Soft tissues: Left forehead/periorbital and left she edema. CT CERVICAL SPINE FINDINGS Alignment: Similar alignment.  No substantial sagittal subluxation. Skull base and vertebrae: Vertebral body heights are similar to prior. No evidence of acute fracture. C4-C6 ACDF with solid bony fusion. Soft tissues and spinal canal: No prevertebral fluid or swelling. No visible canal hematoma. Disc levels:  Similar multilevel degenerative change. Upper chest: Visualized lung apices are clear. IMPRESSION: 1. No evidence of acute intracranial abnormality. 2. Left forehead/periorbital and left cheek edema, nonspecific but presumably contusion in the setting trauma. No acute facial fracture. 3. No evidence of acute fracture or traumatic malalignment in the cervical spine. Solid C4-C6 ACDF. Electronically Signed   By: Margaretha Sheffield M.D.   On: 05/03/2021 15:25    Scheduled Meds:  risperiDONE  3 mg Oral QHS   sodium chloride flush  3 mL  Intravenous Q12H   Continuous Infusions:  sodium chloride 100 mL/hr at 05/04/21 1021     LOS: 0 days   Shelly Coss, MD [facility] P2/16/2023, 11:38 AM

## 2021-05-04 NOTE — ED Notes (Signed)
PT at bedside.

## 2021-05-04 NOTE — Progress Notes (Signed)
°  Echocardiogram 2D Echocardiogram has been performed.  Bobbye Charleston 05/04/2021, 8:32 AM

## 2021-05-04 NOTE — Assessment & Plan Note (Addendum)
Noted to be hypokalemic and hypocalcemic on 2/16 Supplemented and corrected

## 2021-05-04 NOTE — Hospital Course (Addendum)
Patient is a 66 year old female with history of anxiety, bipolar disorder, depression, sinus pauses status post pacemaker placement, cervical cancer, chronic pain syndrome who presented to the ED after a fall at home.  She apparently fell 2 days ago and was on the ground for 2 days.  She does not remember any events preceding the fall.  She could not get up.  Her daughter found her on the floor and called 911.  She takes oxycodone and Xanax at home.  On condition she was tachycardic.  Lab work showed elevated CK, elevated LFT, leukocytosis.  Skeletal survey, CT head, cervical spine did not show any acute fracture or dislocation or any other acute abnormalities.  Patient was started on IV fluids, PT/OT ordered.  Therapies have recommended home health.  Her mental status has improved and she is currently alert and oriented.  Not discharged today due to elevated CK, continue IV fluids for today, possible discharge tomorrow

## 2021-05-04 NOTE — Assessment & Plan Note (Addendum)
Patient was admitted here on December 2022 with the same problem : she was found on the floor and confused.  At that time psychiatry and neurology were consulted.  She had some improvement in the mental status and was sent back to home with home health.  Vitamin B12 was normal.  CT head did not show any acute intracranial abnormalities CT head done on this admission also did not show any acute problems.  As per the daughter, she has been confused and forgetful intermittently.  Possibility of  new onset dementia.  Mental status has significantly improved today and she was alert and oriented.

## 2021-05-04 NOTE — TOC Initial Note (Addendum)
Transition of Care [facility]) - Initial/Assessment Note    Patient Details  Name: [name] MRN: [id number] Date of Birth: 07/29/1955  Transition of Care [facility]) CM/SW Contact:    Marilu Favre, RN Phone Number: 05/04/2021, 1:07 PM  Clinical Narrative:                 Spoke with patient at bedside and daughter Tabitha [phone] on speaker phone.   Discussed PT recommendations for HHPT , 3 in 1 and 24 supervision.   Confirmed with Tabitha she is moving in with her mother, when her mother is discharged and she can provide 24 hour supervision.   No preference in home health agency.   Levada Dy with [facility] checking to see if she can accept referral. Levada Dy with [facility] accepted referral.   Ordered 3 in1 with Freda Munro with [facility].   Patient has a walker at home already   Expected Discharge Plan: [place] Barriers to Discharge: Continued Medical Work up   Patient Goals and CMS Choice Patient states their goals for this hospitalization and ongoing recovery are:: to return to home CMS Medicare.gov Compare Post Acute Care list provided to:: Patient Choice offered to / list presented to : Patient  Expected Discharge Plan and Services Expected Discharge Plan: [place]   Discharge Planning Services: CM Consult Post Acute Care Choice: [place] arrangements for the past 2 months: Apartment                 DME Arranged: 3-N-1 DME Agency: [facility] Date DME Agency Contacted: 05/04/21     HH Arranged: PT          Prior Living Arrangements/Services Living arrangements for the past 2 months: Apartment Lives with:: Self Patient language and need for interpreter reviewed:: Yes Do you feel safe going back to the place where you live?: Yes      Need for Family Participation in Patient Care: Yes (Comment) Care giver support system in place?: Yes (comment) Current home services: DME Criminal Activity/Legal Involvement  Pertinent to Current Situation/Hospitalization: No - Comment as needed  Activities of Daily Living      Permission Sought/Granted   Permission granted to share information with : Yes, Verbal Permission Granted  Share Information with NAME: [name] [phone]           Emotional Assessment Appearance:: Appears stated age Attitude/Demeanor/Rapport: Engaged Affect (typically observed): Accepting Orientation: : Oriented to Self, Oriented to Place, Oriented to  Time, Oriented to Situation Alcohol / Substance Use: Not Applicable Psych Involvement: No (comment)  Admission diagnosis:  Rhabdomyolysis [M62.82] AKI (acute kidney injury) ([place]) [N17.9] Fall, initial encounter [W19.XXXA] Non-traumatic rhabdomyolysis [M62.82] Patient Active Problem List   Diagnosis Date Noted   Electrolyte abnormality 05/04/2021   Altered mental status 05/04/2021   fall with rhabdomyloysis  05/03/2021   Leukocytosis 05/03/2021   Chronic pain 05/03/2021   Underweight 05/03/2021   Lactic acidosis 30/09/6224   Acute metabolic encephalopathy 33/35/4562   Degeneration of lumbar intervertebral disc 08/16/2020   Sepsis secondary to UTI ([place]) 04/17/2020   Hypoglycemia without diagnosis of diabetes mellitus 04/17/2020   Sepsis due to pneumonia ([place]) 04/17/2020   Physical deconditioning    Myositis 03/03/2020   HCAP (healthcare-associated pneumonia) 02/21/2020   Generalized weakness 02/21/2020   GAD (generalized anxiety disorder) 02/21/2020   Proximal muscle weakness 02/08/2020   Dysphagia 02/08/2020   Gait disturbance 01/04/2020   Elevated CK 01/04/2020  Weakness of both lower extremities 01/04/2020   Encephalopathy    Polypharmacy    Fall 12/18/2019   Hypokalemia    Abnormal liver function test    Cervical post-laminectomy syndrome 10/21/2019   Cervical spondylosis 09/23/2019   Memory loss 07/02/2019   Depression with anxiety 07/02/2019   B12 deficiency 07/02/2019   Lumbar radiculopathy  02/10/2019   History of fusion of lumbar spine 02/10/2019   Dysesthesia 02/10/2019   Retroperitoneal fibrosis    Pelvic adhesions    Left ovarian cyst 11/17/2018   Elevated tumor markers 11/17/2018   Dyspnea 09/15/2018   Laryngopharyngeal reflux (LPR) 05/19/2018   Bipolar disorder ([place]) 04/16/2018   Insomnia 04/16/2018   Attention deficit hyperactivity disorder (ADHD) 04/16/2018   Pain in right knee 04/04/2017   Neck pain on right side 09/09/2014   Pseudoarthrosis of lumbar spine 04/15/2014   Family history of arrhythmogenic right ventricular cardiomyopathy 03/30/2013   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 02/20/2013   Sinus pause 02/14/2013   Hx of syncope- s/p Loop recorder 11/06/12 10/29/2012   Vertigo 10/29/2012   Tobacco abuse 10/29/2012   ANXIETY 11/14/2009   BENIGN POSITIONAL VERTIGO 11/14/2009   EAR PAIN, RIGHT 11/14/2009   SCIATICA 05/09/2009   PCP:  Lujean Amel, MD Pharmacy:   [id number] - [place], [place] [address] [place] [address] Phone: [phone] Fax: [phone]     Social Determinants of Health (SDOH) Interventions    Readmission Risk Interventions No flowsheet data found.

## 2021-05-04 NOTE — ED Notes (Signed)
Echo at bedside, delaying pacemaker interrogation and orthostatic vitals

## 2021-05-05 LAB — CBC WITH DIFFERENTIAL/PLATELET
Abs Immature Granulocytes: 0.05 10*3/uL (ref 0.00–0.07)
Basophils Absolute: 0.1 10*3/uL (ref 0.0–0.1)
Basophils Relative: 0 %
Eosinophils Absolute: 0.1 10*3/uL (ref 0.0–0.5)
Eosinophils Relative: 1 %
HCT: 35.5 % — ABNORMAL LOW (ref 36.0–46.0)
Hemoglobin: 11.7 g/dL — ABNORMAL LOW (ref 12.0–15.0)
Immature Granulocytes: 0 %
Lymphocytes Relative: 23 %
Lymphs Abs: 3.1 10*3/uL (ref 0.7–4.0)
MCH: 29 pg (ref 26.0–34.0)
MCHC: 33 g/dL (ref 30.0–36.0)
MCV: 88.1 fL (ref 80.0–100.0)
Monocytes Absolute: 1 10*3/uL (ref 0.1–1.0)
Monocytes Relative: 7 %
Neutro Abs: 9.3 10*3/uL — ABNORMAL HIGH (ref 1.7–7.7)
Neutrophils Relative %: 69 %
Platelets: 225 10*3/uL (ref 150–400)
RBC: 4.03 MIL/uL (ref 3.87–5.11)
RDW: 17.8 % — ABNORMAL HIGH (ref 11.5–15.5)
WBC: 13.5 10*3/uL — ABNORMAL HIGH (ref 4.0–10.5)
nRBC: 0 % (ref 0.0–0.2)

## 2021-05-05 LAB — BASIC METABOLIC PANEL
Anion gap: 4 — ABNORMAL LOW (ref 5–15)
BUN: 13 mg/dL (ref 8–23)
CO2: 21 mmol/L — ABNORMAL LOW (ref 22–32)
Calcium: 7.9 mg/dL — ABNORMAL LOW (ref 8.9–10.3)
Chloride: 112 mmol/L — ABNORMAL HIGH (ref 98–111)
Creatinine, Ser: 0.78 mg/dL (ref 0.44–1.00)
GFR, Estimated: >60 mL/min (ref >60)
Glucose, Bld: 95 mg/dL (ref 70–99)
Potassium: 3.9 mmol/L (ref 3.5–5.1)
Sodium: 137 mmol/L (ref 135–145)

## 2021-05-05 LAB — CK: Total CK: 3446 U/L — ABNORMAL HIGH (ref 38–234)

## 2021-05-05 MED ORDER — BOOST / RESOURCE BREEZE PO LIQD CUSTOM
1.0000 | Freq: Three times a day (TID) | ORAL | Status: DC
Start: 2021-05-05 — End: 2021-05-07
  Administered 2021-05-05: 1 via ORAL

## 2021-05-05 MED ORDER — ADULT MULTIVITAMIN W/MINERALS CH
1.0000 | ORAL_TABLET | Freq: Every day | ORAL | Status: DC
  Administered 2021-05-05 – 2021-05-08 (×4): 1 via ORAL
  Filled 2021-05-05 (×4): qty 1

## 2021-05-05 NOTE — Evaluation (Signed)
Occupational Therapy Evaluation Patient Details Name: [name] MRN: [id number] DOB: [birth year] Today's Date: 05/05/2021   History of Present Illness [name] is a 66 y.o. female with medical history significant of anxiety, bipolar disorder, depression, sinus pauses s/p PPM, cervical cancer, chronic pain with h/o back and neck surgery who was admitted after found on the floor by her daughter where she was for likely 2 days due to syncope versus fall.   Clinical Impression   Pt was living independently prior to admission. She and her daughter had planned to move in together, daughter can stay with pt when she is discharged. Pt presents with generalized weakness and impaired standing balance. She requires up to min assist for ADLs and mobility with RW. Pt declined remaining up in chair, states is is not comfortable. Educated in multiple uses of 3 in 1. Will follow acutely.      Recommendations for follow up therapy are one component of a multi-disciplinary discharge planning process, led by the attending physician.  Recommendations may be updated based on patient status, additional functional criteria and insurance authorization.   Follow Up Recommendations  Home health OT    Assistance Recommended at Discharge Frequent or constant Supervision/Assistance  Patient can return home with the following A little help with walking and/or transfers;A little help with bathing/dressing/bathroom;Assistance with cooking/housework;Direct supervision/assist for medications management;Assist for transportation;Help with stairs or ramp for entrance    Functional Status Assessment  Patient has had a recent decline in their functional status and demonstrates the ability to make significant improvements in function in a reasonable and predictable amount of time.  Equipment Recommendations  BSC/3in1    Recommendations for Other Services       Precautions / Restrictions Precautions Precautions:  Fall Restrictions Weight Bearing Restrictions: No      Mobility Bed Mobility Overal bed mobility: Needs Assistance Bed Mobility: Supine to Sit, Sit to Supine     Supine to sit: Min assist, HOB elevated Sit to supine: Min assist   General bed mobility comments: assist to raise trunk and for LEs back into bed    Transfers Overall transfer level: Needs assistance Equipment used: Rolling walker (2 wheels) Transfers: Sit to/from Stand Sit to Stand: Min guard           General transfer comment: cues for hand placement, increased time      Balance Overall balance assessment: Needs assistance   Sitting balance-Leahy Scale: Good Sitting balance - Comments: no LOB donning socks   Standing balance support: Bilateral upper extremity supported, Reliant on assistive device for balance Standing balance-Leahy Scale: Poor                             ADL either performed or assessed with clinical judgement   ADL Overall ADL's : Needs assistance/impaired Eating/Feeding: Independent;Bed level   Grooming: Wash/dry hands;Wash/dry face;Sitting;Supervision/safety   Upper Body Bathing: Minimal assistance;Sitting   Lower Body Bathing: Minimal assistance;Sit to/from stand   Upper Body Dressing : Set up;Sitting   Lower Body Dressing: Minimal assistance;Sit to/from stand Lower Body Dressing Details (indicate cue type and reason): crosses foot over opposite knee Toilet Transfer: Minimal assistance;Ambulation;Rolling walker (2 wheels)   Toileting- Clothing Manipulation and Hygiene: Minimal assistance;Sit to/from stand       Functional mobility during ADLs: Minimal assistance;Rolling walker (2 wheels)       Vision Baseline Vision/History: 1 Wears glasses Ability to See in Adequate Light: 0  Adequate Patient Visual Report: No change from baseline       Perception     Praxis      Pertinent Vitals/Pain Pain Assessment Pain Assessment: No/denies pain     Hand  Dominance Right   Extremity/Trunk Assessment Upper Extremity Assessment Upper Extremity Assessment: Generalized weakness           Communication Communication Communication: No difficulties   Cognition Arousal/Alertness: Awake/alert Behavior During Therapy: Flat affect Overall Cognitive Status: Impaired/Different from baseline                               Problem Solving: Slow processing       General Comments       Exercises     Shoulder Instructions      Home Living Family/patient expects to be discharged to:: Private residence Living Arrangements: Children;Alone Available Help at Discharge: Family;Available 24 hours/day Type of Home: Apartment Home Access: Stairs to enter [employer] of Steps: flight Entrance Stairs-Rails: Right Home Layout: One level     Bathroom Shower/Tub: Teacher, early years/pre: Standard     Home Equipment: None   Additional Comments: daughter will be staying with her      Prior Functioning/Environment Prior Level of Function : Independent/Modified Independent             Mobility Comments: drives          OT Problem List: Decreased strength;Impaired balance (sitting and/or standing);Decreased knowledge of use of DME or AE;Decreased cognition      OT Treatment/Interventions: Self-care/ADL training;DME and/or AE instruction;Patient/family education;Balance training;Cognitive remediation/compensation;Therapeutic activities    OT Goals(Current goals can be found in the care plan section) Acute Rehab OT Goals OT Goal Formulation: With patient Time For Goal Achievement: 05/19/21 Potential to Achieve Goals: Good ADL Goals Pt Will Perform Grooming: with supervision;standing Pt Will Perform Lower Body Bathing: with supervision;sit to/from stand Pt Will Perform Lower Body Dressing: with supervision;sit to/from stand Pt Will Transfer to Toilet: with supervision;ambulating Pt Will Perform  Toileting - Clothing Manipulation and hygiene: with supervision;sit to/from stand Additional ADL Goal #1: Pt will perform bed mobility modified independently in preparation for ADLs.  OT Frequency: Min 2X/week    Co-evaluation              AM-PAC OT "6 Clicks" Daily Activity     Outcome Measure Help from another person eating meals?: None Help from another person taking care of personal grooming?: A Little Help from another person toileting, which includes using toliet, bedpan, or urinal?: A Little Help from another person bathing (including washing, rinsing, drying)?: A Little Help from another person to put on and taking off regular upper body clothing?: A Little Help from another person to put on and taking off regular lower body clothing?: A Little 6 Click Score: 19   End of Session Equipment Utilized During Treatment: Gait belt;Rolling walker (2 wheels) Nurse Communication: Mobility status  Activity Tolerance: Patient tolerated treatment well Patient left: in bed;with call bell/phone within reach;with bed alarm set  OT Visit Diagnosis: Unsteadiness on feet (R26.81);Other abnormalities of gait and mobility (R26.89);Muscle weakness (generalized) (M62.81);Other symptoms and signs involving cognitive function                Time: 6213-0865 OT Time Calculation (min): 24 min Charges:  OT General Charges $OT Visit: 1 Visit OT Evaluation $OT Eval Moderate Complexity: 1 Mod OT Treatments $Self Care/Home Management :  8-22 mins  Nestor Lewandowsky, OTR/L Acute Rehabilitation Services Pager: [phone] Office: [phone]   Malka So 05/05/2021, 10:47 AM

## 2021-05-05 NOTE — Plan of Care (Signed)
°  Problem: Education: Goal: Knowledge of General Education information will improve Description: Including pain rating scale, medication(s)/side effects and non-pharmacologic comfort measures Outcome: Progressing   Problem: Clinical Measurements: Goal: Ability to maintain clinical measurements within normal limits will improve Outcome: Progressing Goal: Will remain free from infection Outcome: Progressing Goal: Cardiovascular complication will be avoided Outcome: Progressing

## 2021-05-05 NOTE — Progress Notes (Signed)
PROGRESS NOTE  [name]  VEL:[id number] DOB: [birth year] DOA: 05/03/2021 PCP: Lujean Amel, MD   Brief Narrative: Patient is a 66 year old female with history of anxiety, bipolar disorder, depression, sinus pauses status post pacemaker placement, cervical cancer, chronic pain syndrome who presented to the ED after a fall at home.  She apparently fell 2 days ago and was on the ground for 2 days.  She does not remember any events preceding the fall.  She could not get up.  Her daughter found her on the floor and called 911.  She takes oxycodone and Xanax at home.  On condition she was tachycardic.  Lab work showed elevated CK, elevated LFT, leukocytosis.  Skeletal survey, CT head, cervical spine did not show any acute fracture or dislocation or any other acute abnormalities.  Patient was started on IV fluids, PT/OT ordered.  Therapies have recommended home health.  Her mental status has improved and she is currently alert and oriented.  Not discharged today due to elevated CK, continue IV fluids for today, possible discharge tomorrow   Assessment & Plan:  Principal Problem:   fall with rhabdomyloysis  Active Problems:   Altered mental status   Bipolar disorder ([place])   Depression with anxiety   Abnormal liver function test   Fall   Leukocytosis   Chronic pain   Underweight   Lactic acidosis   Electrolyte abnormality   Assessment and Plan: * fall with rhabdomyloysis - (present on admission) Fall on floor at home x 2 days, found by her daughter and presenting to ED in rhabdomyolysis. Unsure how she fell and she can not remember -Past history of syncopal episodes due to sinus pauses s/p [employer] PPM -Pacemaker has been interrogated without finding of any significant arrhythmia -Echo showed EF of 60 to 25%, grade 1 diastolic dysfunction. -Orthostatic vitals  boderline -can not do MRI with PPM and no neurological deficits on exam. Will continue with frequent neuro checks -Continue IV  fluids for elevated CK -Polypharmacy could be the contributing factor too.  She was on oxycodone, Xanax, hydroxyzine -PT/OT recommending home health on discharge.  Patient was living alone, daughter is saying that she will come to live with her now -CK elevated in the range of 4K, will continue IV fluids for today, check level tomorrow.   Altered mental status Patient was admitted here on December 2022 with the same problem : she was found on the floor and confused.  At that time psychiatry and neurology were consulted.  She had some improvement in the mental status and was sent back to home with home health.  Vitamin B12 was normal.  CT head did not show any acute intracranial abnormalities CT head done on this admission also did not show any acute problems.  As per the daughter, she has been confused and forgetful intermittently.  Possibility of  new onset dementia.  Mental status has significantly improved today and she was alert and oriented.   Electrolyte abnormality Noted to be hypokalemic and hypocalcemic on 2/16 Supplemented and corrected  Lactic acidosis Likely secondary to hypovolemia No signs of infection resolved  Underweight- (present on admission) Nutrition consult   Chronic pain On oxycodone 5mg  Tid prn and PMP webside verified  Will continue this   Leukocytosis- (present on admission) Likely reactive, hemoconcentrated Improving  Abnormal liver function test- (present on admission) -AST to 105 on admission -Likely secondary to rhabdomyolysis -continue IVF and monitor   Depression with anxiety- (present on admission) Continue xanax .5mg   Tid prn .  She was seen by psychiatry on last hospitalization and recommended to continue these medicine on DC  Bipolar disorder [facility])- (present on admission) Continue Risperdal          DVT prophylaxis:SCDs Start: 05/03/21 1949     Code Status: Full Code  Family Communication: Daughter on phone on 2/16  Patient  status:Inpatient  Patient is from :Home  Anticipated discharge to: Home with home health tomorrow  Estimated DC date: Tomorrow, expecting CK to trend down   Consultants: None  Procedures: None  Antimicrobials:  Anti-infectives (From admission, onward)    None       Subjective: Patient seen and examined at the bedside this morning.  Her mental status has significantly improved today.  She knew she is in the hospital and told me the current month.  Denies any new complaints and was eager to go home.  But remains weak and deconditioned  Objective: Vitals:   05/04/21 1513 05/04/21 2105 05/05/21 0500 05/05/21 0736  BP: (!) 83/68 115/69 118/65 118/67  Pulse: 77 76 75 69  Resp: 16   16  Temp: 98.8 F (37.1 C) 98.2 F (36.8 C) 98.3 F (36.8 C) 98.5 F (36.9 C)  TempSrc: Oral Oral Oral Oral  SpO2: 98% 99% 99% 99%  Weight:      Height:        Intake/Output Summary (Last 24 hours) at 05/05/2021 1153 Last data filed at 05/05/2021 2993 Gross per 24 hour  Intake 120 ml  Output 400 ml  Net -280 ml   Filed Weights   05/04/21 0729  Weight: 54.4 kg    Examination:   General exam: Very deconditioned, chronically ill looking, weak HEENT: Bruise on the left periorbital area Respiratory system:  no wheezes or crackles  Cardiovascular system: S1 & S2 heard, RRR.  Gastrointestinal system: Abdomen is nondistended, soft and nontender. Central nervous system: Alert and oriented Extremities: No edema, no clubbing ,no cyanosis Skin: No rashes, no ulcers,no icterus    Data Reviewed: I have personally reviewed following labs and imaging studies  CBC: Recent Labs  Lab 05/03/21 1522 05/04/21 0445 05/05/21 0436  WBC 18.5* 14.0* 13.5*  NEUTROABS 16.0*  --  9.3*  HGB 16.7* 12.0 11.7*  HCT 52.1* 38.5 35.5*  MCV 87.3 90.4 88.1  PLT 358 229 716   Basic Metabolic Panel: Recent Labs  Lab 05/03/21 1522 05/03/21 1949 05/04/21 0445 05/05/21 0436  NA 143  --  141 137  K 3.8   --  3.2* 3.9  CL 107  --  118* 112*  CO2 23  --  16* 21*  GLUCOSE 111*  --  85 95  BUN 21  --  13 13  CREATININE 1.06*  --  0.56 0.78  CALCIUM 9.4  --  6.6* 7.9*  MG  --  2.0  --   --   PHOS  --  3.1  --   --      Recent Results (from the past 240 hour(s))  Resp Panel by RT-PCR (Flu A&B, Covid) Nasopharyngeal Swab     Status: None   Collection Time: 05/03/21  2:25 PM   Specimen: Nasopharyngeal Swab; Nasopharyngeal(NP) swabs in vial transport medium  Result Value Ref Range Status   SARS Coronavirus 2 by RT PCR NEGATIVE NEGATIVE Final    Comment: (NOTE) SARS-CoV-2 target nucleic acids are NOT DETECTED.  The SARS-CoV-2 RNA is generally detectable in upper respiratory specimens during the acute phase of infection. The  lowest concentration of SARS-CoV-2 viral copies this assay can detect is 138 copies/mL. A negative result does not preclude SARS-Cov-2 infection and should not be used as the sole basis for treatment or other patient management decisions. A negative result may occur with  improper specimen collection/handling, submission of specimen other than nasopharyngeal swab, presence of viral mutation(s) within the areas targeted by this assay, and inadequate number of viral copies(<138 copies/mL). A negative result must be combined with clinical observations, patient history, and epidemiological information. The expected result is Negative.  Fact Sheet for Patients:  EntrepreneurPulse.com.au  Fact Sheet for Healthcare Providers:  IncredibleEmployment.be  This test is no t yet approved or cleared by the [place] FDA and  has been authorized for detection and/or diagnosis of SARS-CoV-2 by FDA under an Emergency Use Authorization (EUA). This EUA will remain  in effect (meaning this test can be used) for the duration of the COVID-19 declaration under Section 564(b)(1) of the Act, 21 U.S.C.section 360bbb-3(b)(1), unless the authorization  is terminated  or revoked sooner.       Influenza A by PCR NEGATIVE NEGATIVE Final   Influenza B by PCR NEGATIVE NEGATIVE Final    Comment: (NOTE) The Xpert Xpress SARS-CoV-2/FLU/RSV plus assay is intended as an aid in the diagnosis of influenza from Nasopharyngeal swab specimens and should not be used as a sole basis for treatment. Nasal washings and aspirates are unacceptable for Xpert Xpress SARS-CoV-2/FLU/RSV testing.  Fact Sheet for Patients: EntrepreneurPulse.com.au  Fact Sheet for Healthcare Providers: IncredibleEmployment.be  This test is not yet approved or cleared by the [place] FDA and has been authorized for detection and/or diagnosis of SARS-CoV-2 by FDA under an Emergency Use Authorization (EUA). This EUA will remain in effect (meaning this test can be used) for the duration of the COVID-19 declaration under Section 564(b)(1) of the Act, 21 U.S.C. section 360bbb-3(b)(1), unless the authorization is terminated or revoked.  Performed at [facility], [place] [address] [place], [place] [address]      Radiology Studies: DG Chest 2 View  Result Date: 05/03/2021 CLINICAL DATA:  Shortness of breath. EXAM: CHEST - 2 VIEW COMPARISON:  March 08, 2021. FINDINGS: The heart size and mediastinal contours are within normal limits. Both lungs are clear. Left-sided pacemaker is unchanged in position. The visualized skeletal structures are unremarkable. IMPRESSION: No active cardiopulmonary disease. Electronically Signed   By: Marijo Conception M.D.   On: 05/03/2021 15:04   CT Head Wo Contrast  Result Date: 05/03/2021 CLINICAL DATA:  Head trauma, moderate-severe; Neck trauma, dangerous injury mechanism (Age 75-64y); Facial trauma, blunt EXAM: CT HEAD WITHOUT CONTRAST CT MAXILLOFACIAL WITHOUT CONTRAST CT CERVICAL SPINE WITHOUT CONTRAST TECHNIQUE: Multidetector CT imaging of the head, cervical spine, and maxillofacial structures were  performed using the standard protocol without intravenous contrast. Multiplanar CT image reconstructions of the cervical spine and maxillofacial structures were also generated. RADIATION DOSE REDUCTION: This exam was performed according to the departmental dose-optimization program which includes automated exposure control, adjustment of the mA and/or kV according to patient size and/or use of iterative reconstruction technique. COMPARISON:  CT head 03/08/2021. CT cervical spine 02/25/2020. FINDINGS: CT HEAD FINDINGS Brain: No evidence of acute infarction, hemorrhage, hydrocephalus, extra-axial collection or mass lesion/mass effect. Vascular: Calcific intracranial atherosclerosis. No hyperdense vessel identified. Skull: No acute fracture. Other: No mastoid effusions. CT MAXILLOFACIAL FINDINGS Osseous: No fracture or mandibular dislocation. No destructive process. Orbits: Negative. No traumatic or inflammatory finding. Sinuses: Right frontal sinus mucosal thickening with  frothy secretions. Opacified right frontoethmoidal recess. Otherwise, visualized sinuses are largely clear with mild scattered mucosal thickening. Soft tissues: Left forehead/periorbital and left she edema. CT CERVICAL SPINE FINDINGS Alignment: Similar alignment.  No substantial sagittal subluxation. Skull base and vertebrae: Vertebral body heights are similar to prior. No evidence of acute fracture. C4-C6 ACDF with solid bony fusion. Soft tissues and spinal canal: No prevertebral fluid or swelling. No visible canal hematoma. Disc levels:  Similar multilevel degenerative change. Upper chest: Visualized lung apices are clear. IMPRESSION: 1. No evidence of acute intracranial abnormality. 2. Left forehead/periorbital and left cheek edema, nonspecific but presumably contusion in the setting trauma. No acute facial fracture. 3. No evidence of acute fracture or traumatic malalignment in the cervical spine. Solid C4-C6 ACDF. Electronically Signed   By:  Margaretha Sheffield M.D.   On: 05/03/2021 15:25   CT Cervical Spine Wo Contrast  Result Date: 05/03/2021 CLINICAL DATA:  Head trauma, moderate-severe; Neck trauma, dangerous injury mechanism (Age 87-64y); Facial trauma, blunt EXAM: CT HEAD WITHOUT CONTRAST CT MAXILLOFACIAL WITHOUT CONTRAST CT CERVICAL SPINE WITHOUT CONTRAST TECHNIQUE: Multidetector CT imaging of the head, cervical spine, and maxillofacial structures were performed using the standard protocol without intravenous contrast. Multiplanar CT image reconstructions of the cervical spine and maxillofacial structures were also generated. RADIATION DOSE REDUCTION: This exam was performed according to the departmental dose-optimization program which includes automated exposure control, adjustment of the mA and/or kV according to patient size and/or use of iterative reconstruction technique. COMPARISON:  CT head 03/08/2021. CT cervical spine 02/25/2020. FINDINGS: CT HEAD FINDINGS Brain: No evidence of acute infarction, hemorrhage, hydrocephalus, extra-axial collection or mass lesion/mass effect. Vascular: Calcific intracranial atherosclerosis. No hyperdense vessel identified. Skull: No acute fracture. Other: No mastoid effusions. CT MAXILLOFACIAL FINDINGS Osseous: No fracture or mandibular dislocation. No destructive process. Orbits: Negative. No traumatic or inflammatory finding. Sinuses: Right frontal sinus mucosal thickening with frothy secretions. Opacified right frontoethmoidal recess. Otherwise, visualized sinuses are largely clear with mild scattered mucosal thickening. Soft tissues: Left forehead/periorbital and left she edema. CT CERVICAL SPINE FINDINGS Alignment: Similar alignment.  No substantial sagittal subluxation. Skull base and vertebrae: Vertebral body heights are similar to prior. No evidence of acute fracture. C4-C6 ACDF with solid bony fusion. Soft tissues and spinal canal: No prevertebral fluid or swelling. No visible canal hematoma. Disc  levels:  Similar multilevel degenerative change. Upper chest: Visualized lung apices are clear. IMPRESSION: 1. No evidence of acute intracranial abnormality. 2. Left forehead/periorbital and left cheek edema, nonspecific but presumably contusion in the setting trauma. No acute facial fracture. 3. No evidence of acute fracture or traumatic malalignment in the cervical spine. Solid C4-C6 ACDF. Electronically Signed   By: Margaretha Sheffield M.D.   On: 05/03/2021 15:25   DG Shoulder Left  Result Date: 05/03/2021 CLINICAL DATA:  Unwitnessed fall. EXAM: LEFT SHOULDER - 2+ VIEW COMPARISON:  June 07, 2013. FINDINGS: There is no evidence of fracture or dislocation. There is no evidence of arthropathy or other focal bone abnormality. Soft tissues are unremarkable. IMPRESSION: Negative. Electronically Signed   By: Marijo Conception M.D.   On: 05/03/2021 15:06   DG Knee Complete 4 Views Left  Result Date: 05/03/2021 CLINICAL DATA:  Unwitnessed fall. EXAM: LEFT KNEE - COMPLETE 4+ VIEW COMPARISON:  None. FINDINGS: No evidence of fracture, dislocation, or joint effusion. No evidence of arthropathy or other focal bone abnormality. Soft tissues are unremarkable. IMPRESSION: Negative. Electronically Signed   By: Marijo Conception M.D.   On: 05/03/2021  15:05   ECHOCARDIOGRAM COMPLETE  Result Date: 05/04/2021    ECHOCARDIOGRAM REPORT   Patient Name:   CHABELI BARSAMIAN Date of Exam: 05/04/2021 Medical Rec #:  [id number]      Height:       68.0 in Accession #:    [id number]     Weight:       120.0 lb Date of Birth:  [birth year]      BSA:          1.645 m Patient Age:    61 years       BP:           116/71 mmHg Patient Gender: F              HR:           79 bpm. Exam Location:  Inpatient Procedure: 2D Echo, 3D Echo, Cardiac Doppler and Color Doppler Indications:    R55 Syncope  History:        Patient has prior history of Echocardiogram examinations, most                 recent 12/18/2019. Abnormal ECG and Pacemaker,                  Signs/Symptoms:Syncope and Dyspnea; Risk Factors:Current Smoker.  Sonographer:    Roseanna Rainbow RDCS Referring Phys: [id number] [facility]  Sonographer Comments: Technically difficult study due to poor echo windows. Patient has left shoulder injury, could not turn. IMPRESSIONS  1. Left ventricular ejection fraction, by estimation, is 60 to 65%. Left ventricular ejection fraction by 3D volume is 63 %. The left ventricle has normal function. The left ventricle has no regional wall motion abnormalities. Left ventricular diastolic  parameters are consistent with Grade I diastolic dysfunction (impaired relaxation).  2. Right ventricular systolic function is normal. The right ventricular size is normal. There is normal pulmonary artery systolic pressure. The estimated right ventricular systolic pressure is 41.6 mmHg.  3. The mitral valve is normal in structure. Trivial mitral valve regurgitation.  4. Tricuspid valve regurgitation is moderate.  5. The aortic valve is tricuspid. There is mild thickening of the aortic valve. Aortic valve regurgitation is not visualized. Aortic valve sclerosis is present, with no evidence of aortic valve stenosis.  6. The inferior vena cava is normal in size with greater than 50% respiratory variability, suggesting right atrial pressure of 3 mmHg. Comparison(s): Compared to prior TTE in 12/2019, there now appears to be moderate TR (previously reported as trivial). FINDINGS  Left Ventricle: Left ventricular ejection fraction, by estimation, is 60 to 65%. Left ventricular ejection fraction by 3D volume is 63 %. The left ventricle has normal function. The left ventricle has no regional wall motion abnormalities. The left ventricular internal cavity size was normal in size. There is no left ventricular hypertrophy. Left ventricular diastolic parameters are consistent with Grade I diastolic dysfunction (impaired relaxation). Normal left ventricular filling pressure. Right Ventricle: The right  ventricular size is normal. No increase in right ventricular wall thickness. Right ventricular systolic function is normal. There is normal pulmonary artery systolic pressure. The tricuspid regurgitant velocity is 2.39 m/s, and  with an assumed right atrial pressure of 3 mmHg, the estimated right ventricular systolic pressure is 60.6 mmHg. Left Atrium: Left atrial size was normal in size. Right Atrium: Right atrial size was normal in size. Pericardium: There is no evidence of pericardial effusion. Mitral Valve: The mitral valve is normal in structure. There is mild thickening  of the mitral valve leaflet(s). There is mild calcification of the mitral valve leaflet(s). Trivial mitral valve regurgitation. Tricuspid Valve: The tricuspid valve is normal in structure. Tricuspid valve regurgitation is moderate. Aortic Valve: The aortic valve is tricuspid. There is mild thickening of the aortic valve. Aortic valve regurgitation is not visualized. Aortic valve sclerosis is present, with no evidence of aortic valve stenosis. Pulmonic Valve: The pulmonic valve was normal in structure. Pulmonic valve regurgitation is trivial. Aorta: The aortic root and ascending aorta are structurally normal, with no evidence of dilitation. Venous: The inferior vena cava is normal in size with greater than 50% respiratory variability, suggesting right atrial pressure of 3 mmHg. IAS/Shunts: The atrial septum is grossly normal. Additional Comments: A device lead is visualized.  LEFT VENTRICLE PLAX 2D LVIDd:         3.90 cm         Diastology LVIDs:         2.55 cm         LV e' medial:    8.05 cm/s LV PW:         0.90 cm         LV E/e' medial:  8.6 LV IVS:        0.90 cm         LV e' lateral:   9.14 cm/s LVOT diam:     1.90 cm         LV E/e' lateral: 7.6 LV SV:         49 LV SV Index:   30 LVOT Area:     2.84 cm        3D Volume EF                                LV 3D EF:    Left                                             ventricul LV Volumes  (MOD)                            ar LV vol d, MOD    50.8 ml                    ejection A2C:                                        fraction LV vol d, MOD    46.1 ml                    by 3D A4C:                                        volume is LV vol s, MOD    20.5 ml                    63 %. A2C: LV vol s, MOD    18.4 ml A4C:  3D Volume EF: LV SV MOD A2C:   30.3 ml       3D EF:        63 % LV SV MOD A4C:   46.1 ml       LV EDV:       79 ml LV SV MOD BP:    30.6 ml       LV ESV:       29 ml                                LV SV:        50 ml RIGHT VENTRICLE             IVC RV S prime:     13.10 cm/s  IVC diam: 1.30 cm TAPSE (M-mode): 1.7 cm LEFT ATRIUM             Index        RIGHT ATRIUM           Index LA diam:        2.70 cm 1.64 cm/m   RA Area:     11.00 cm LA Vol (A2C):   16.2 ml 9.85 ml/m   RA Volume:   23.30 ml  14.16 ml/m LA Vol (A4C):   20.5 ml 12.46 ml/m LA Biplane Vol: 18.2 ml 11.06 ml/m  AORTIC VALVE             PULMONIC VALVE LVOT Vmax:   101.00 cm/s PV Vmax:          2.44 m/s LVOT Vmean:  62.300 cm/s PV Peak grad:     23.8 mmHg LVOT VTI:    0.174 m     PR End Diast Vel: 1.83 msec  AORTA Ao Root diam: 3.10 cm Ao Asc diam:  3.00 cm MITRAL VALVE               TRICUSPID VALVE MV Area (PHT): 3.48 cm    TR Peak grad:   22.8 mmHg MV Decel Time: 218 msec    TR Vmax:        239.00 cm/s MV E velocity: 69.50 cm/s MV A velocity: 85.70 cm/s  SHUNTS MV E/A ratio:  0.81        Systemic VTI:  0.17 m                            Systemic Diam: 1.90 cm Gwyndolyn Kaufman MD Electronically signed by Gwyndolyn Kaufman MD Signature Date/Time: 05/04/2021/1:36:16 PM    Final    DG Hip Unilat W or Wo Pelvis 2-3 Views Left  Result Date: 05/03/2021 CLINICAL DATA:  Fall, pain EXAM: DG HIP (WITH OR WITHOUT PELVIS) 2-3V LEFT COMPARISON:  None. FINDINGS: There is no evidence of acute fracture. Mild osteoarthritis of the hips. Partially visualized lumbar spine fusion hardware. IMPRESSION: No  evidence of acute left hip fracture. Note that if the patient is unable to bear weight and clinical suspicion for non-displaced hip fracture is significant, CT or MRI would be more sensitive. Electronically Signed   By: Maurine Simmering M.D.   On: 05/03/2021 16:17   CT Maxillofacial Wo Contrast  Result Date: 05/03/2021 CLINICAL DATA:  Head trauma, moderate-severe; Neck trauma, dangerous injury mechanism (Age 28-64y); Facial trauma, blunt EXAM: CT HEAD WITHOUT CONTRAST CT MAXILLOFACIAL WITHOUT CONTRAST CT CERVICAL SPINE WITHOUT CONTRAST TECHNIQUE: Multidetector CT  imaging of the head, cervical spine, and maxillofacial structures were performed using the standard protocol without intravenous contrast. Multiplanar CT image reconstructions of the cervical spine and maxillofacial structures were also generated. RADIATION DOSE REDUCTION: This exam was performed according to the departmental dose-optimization program which includes automated exposure control, adjustment of the mA and/or kV according to patient size and/or use of iterative reconstruction technique. COMPARISON:  CT head 03/08/2021. CT cervical spine 02/25/2020. FINDINGS: CT HEAD FINDINGS Brain: No evidence of acute infarction, hemorrhage, hydrocephalus, extra-axial collection or mass lesion/mass effect. Vascular: Calcific intracranial atherosclerosis. No hyperdense vessel identified. Skull: No acute fracture. Other: No mastoid effusions. CT MAXILLOFACIAL FINDINGS Osseous: No fracture or mandibular dislocation. No destructive process. Orbits: Negative. No traumatic or inflammatory finding. Sinuses: Right frontal sinus mucosal thickening with frothy secretions. Opacified right frontoethmoidal recess. Otherwise, visualized sinuses are largely clear with mild scattered mucosal thickening. Soft tissues: Left forehead/periorbital and left she edema. CT CERVICAL SPINE FINDINGS Alignment: Similar alignment.  No substantial sagittal subluxation. Skull base and  vertebrae: Vertebral body heights are similar to prior. No evidence of acute fracture. C4-C6 ACDF with solid bony fusion. Soft tissues and spinal canal: No prevertebral fluid or swelling. No visible canal hematoma. Disc levels:  Similar multilevel degenerative change. Upper chest: Visualized lung apices are clear. IMPRESSION: 1. No evidence of acute intracranial abnormality. 2. Left forehead/periorbital and left cheek edema, nonspecific but presumably contusion in the setting trauma. No acute facial fracture. 3. No evidence of acute fracture or traumatic malalignment in the cervical spine. Solid C4-C6 ACDF. Electronically Signed   By: Margaretha Sheffield M.D.   On: 05/03/2021 15:25    Scheduled Meds:  risperiDONE  3 mg Oral QHS   sodium chloride flush  3 mL Intravenous Q12H   Continuous Infusions:  sodium chloride 100 mL/hr at 05/04/21 1722     LOS: 1 day   Shelly Coss, MD [facility] P2/17/2023, 11:53 AM

## 2021-05-05 NOTE — Plan of Care (Signed)

## 2021-05-05 NOTE — Progress Notes (Signed)
Mobility Specialist Progress Note:   05/05/21 1450  Mobility  Activity Ambulated with assistance in hallway  Level of Assistance Contact guard assist, steadying assist  Assistive Device Front wheel walker  Distance Ambulated (ft) 120 ft  Activity Response Tolerated well  $Mobility charge 1 Mobility   Pt received in bed willing to participate in mobility. No complaints of pain and asymptomatic. Pt left in bed with call bell in reach and all needs met.   [facility] Public librarian Phone [phone]

## 2021-05-05 NOTE — Progress Notes (Signed)
Physical Therapy Treatment Patient Details Name: [name] MRN: [id number] DOB: Jul 19, 1955 Today's Date: 05/05/2021   History of Present Illness [name] is a 66 y.o. female with medical history significant of anxiety, bipolar disorder, depression, sinus pauses s/p PPM, cervical cancer, chronic pain with h/o back and neck surgery who was admitted after found on the floor by her daughter where she was for likely 2 days due to syncope versus fall.    PT Comments    Pt received in supine, alert and cooperative, participatory with encouragement and with improved activity tolerance compared with previous session. Pt needing up to min guard and mod cues for initiating and performing transfers and gait this session. Pt c/o fatigue after hallway gait trial, so plan to initiate stair training next session as pt has flight of stairs to get up to her apartment. Pt continues to benefit from PT services to progress toward functional mobility goals.   Recommendations for follow up therapy are one component of a multi-disciplinary discharge planning process, led by the attending physician.  Recommendations may be updated based on patient status, additional functional criteria and insurance authorization.  Follow Up Recommendations  Home health PT     Assistance Recommended at Discharge Frequent or constant Supervision/Assistance  Patient can return home with the following A little help with walking and/or transfers;A little help with bathing/dressing/bathroom;Assistance with cooking/housework;Direct supervision/assist for medications management;Assist for transportation;Help with stairs or ramp for entrance   Equipment Recommendations  BSC/3in1    Recommendations for Other Services       Precautions / Restrictions Precautions Precautions: Fall Precaution Comments: prev syncopal events Restrictions Weight Bearing Restrictions: No     Mobility  Bed Mobility Overal bed mobility: Needs  Assistance Bed Mobility: Rolling, Sidelying to Sit, Sit to Sidelying Rolling: Supervision Sidelying to sit: Min guard, HOB elevated     Sit to sidelying: Min guard General bed mobility comments: she does better with cues for log roll so she can reach side rail and needs less physical assist this way.    Transfers Overall transfer level: Needs assistance Equipment used: Rolling walker (2 wheels) Transfers: Sit to/from Stand Sit to Stand: Supervision           General transfer comment: cues for hand placement, increased time, no steadying assist needed    Ambulation/Gait Ambulation/Gait assistance: Supervision Gait Distance (Feet): 125 Feet Assistive device: Rolling walker (2 wheels) Gait Pattern/deviations: Step-through pattern, Drifts right/left, Narrow base of support       General Gait Details: improved posture and RW management, needs intermittent cues for proximity to RW and directional navigation      Balance Overall balance assessment: Needs assistance Sitting-balance support: Feet supported Sitting balance-Leahy Scale: Good     Standing balance support: Bilateral upper extremity supported, Reliant on assistive device for balance Standing balance-Leahy Scale: Poor Standing balance comment: no LOB using RW           Cognition Arousal/Alertness: Awake/alert Behavior During Therapy: Flat affect Overall Cognitive Status: Impaired/Different from baseline    Problem Solving: Slow processing General Comments: following simple mobility commands well, pt needing less assist when given cues for improved technique (log roll and scooting prior to standing, etc)        Exercises      General Comments General comments (skin integrity, edema, etc.): VSS on RA, no acute s/sx distress, denies dizziness      Pertinent Vitals/Pain Pain Assessment Pain Assessment: No/denies pain Pain Intervention(s): Repositioned  PT Goals (current goals can now be found  in the care plan section) Acute Rehab PT Goals Patient Stated Goal: to return home with daughter assist PT Goal Formulation: With patient Time For Goal Achievement: 05/18/21 Progress towards PT goals: Progressing toward goals    Frequency    Min 3X/week      PT Plan Current plan remains appropriate       AM-PAC PT "6 Clicks" Mobility   Outcome Measure  Help needed turning from your back to your side while in a flat bed without using bedrails?: A Little Help needed moving from lying on your back to sitting on the side of a flat bed without using bedrails?: A Little Help needed moving to and from a bed to a chair (including a wheelchair)?: A Little Help needed standing up from a chair using your arms (e.g., wheelchair or bedside chair)?: A Little Help needed to walk in hospital room?: A Little Help needed climbing 3-5 steps with a railing? : A Little 6 Click Score: 18    End of Session Equipment Utilized During Treatment: Gait belt Activity Tolerance: Patient tolerated treatment well Patient left: in bed;with call bell/phone within reach;with bed alarm set;with family/visitor present (daughter in room, bed in chair posture) Nurse Communication: Mobility status PT Visit Diagnosis: Other abnormalities of gait and mobility (R26.89);History of falling (Z91.81);Other symptoms and signs involving the nervous system (R29.898)     Time: 3976-7341 PT Time Calculation (min) (ACUTE ONLY): 20 min  Charges:  $Gait Training: 8-22 mins                     Sanii Kukla P., PTA Acute Rehabilitation Services Pager: [phone] Office: [place] 05/05/2021, 5:25 PM

## 2021-05-05 NOTE — Progress Notes (Signed)
Initial Nutrition Assessment  DOCUMENTATION CODES:  Non-severe (moderate) malnutrition in context of social or environmental circumstances  INTERVENTION:  Continue current diet as ordered Boost Breeze po TID, each supplement provides 250 kcal and 9 grams of protein MVI with minerals daily  NUTRITION DIAGNOSIS:  Moderate Malnutrition related to social / environmental circumstances (prolonged inadequate intake) as evidenced by mild fat depletion, mild muscle depletion.  GOAL:  Patient will meet greater than or equal to 90% of their needs  MONITOR:  PO intake, Labs  REASON FOR ASSESSMENT:  Consult Assessment of nutrition requirement/status  ASSESSMENT:  66 y.o. female with hx of cervical cancer presented to ED after a fall at home. Pt laid on the ground for two days prior to being found by family.  Pt resting in bed at the time of assessment, flat affect. Reports that she ate well at breakfast and some of her lunch, but has now developed diarrhea and does not plan to eat dinner.   Discussed nutrition status. Pt reports that she typically only eats 2 meals each day at home. Will eat a sandwich for lunch and then cooks a meat with sides for dinner. Thin on exam with muscle and fat deficits throughout body suggestive of prolonged inadequate intake.   Inquired about nutrition supplements, pt does not like ensure shakes but is willing to try boost breeze.  Average Meal Intake: 2/16: 100% intake x 1 recorded meals  Nutritionally Relevant Medications: Continuous Infusions:  sodium chloride 100 mL/hr at 05/04/21 1722   Labs Reviewed  NUTRITION - FOCUSED PHYSICAL EXAM: Flowsheet Row Most Recent Value  Orbital Region Mild depletion  Upper Arm Region Mild depletion  Thoracic and Lumbar Region Moderate depletion  Buccal Region Mild depletion  Temple Region Severe depletion  Clavicle Bone Region No depletion  Clavicle and Acromion Bone Region Mild depletion  Scapular Bone Region  Mild depletion  Dorsal Hand No depletion  Patellar Region No depletion  Anterior Thigh Region No depletion  Posterior Calf Region No depletion  Edema (RD Assessment) None  Hair Reviewed  Eyes Reviewed  Mouth Reviewed  Skin Reviewed  Nails Reviewed   Diet Order:   Diet Order             Diet regular Room service appropriate? Yes; Fluid consistency: Thin  Diet effective now                   EDUCATION NEEDS:  No education needs have been identified at this time  Skin:  Skin Assessment: Reviewed RN Assessment (bruising and abrasions to the left side of body from fall)  Last BM:  2/17 - diarrhea per pt  Height:  Ht Readings from Last 1 Encounters:  05/05/21 5\' 7"  (1.702 m)   Weight:  Wt Readings from Last 1 Encounters:  05/04/21 54.4 kg    Ideal Body Weight:  61.4 kg  BMI:  Body mass index is 18.79 kg/m.  Estimated Nutritional Needs:  Kcal:  1600-1800 kcal/d Protein:  80-90g/d Fluid:  1.8-2L/d   Ranell Patrick, RD, LDN Clinical Dietitian RD pager # available in [place]  After hours/weekend pager # available in [facility]

## 2021-05-06 LAB — COMPREHENSIVE METABOLIC PANEL
ALT: 23 U/L (ref 0–44)
AST: 71 U/L — ABNORMAL HIGH (ref 15–41)
Albumin: 2.5 g/dL — ABNORMAL LOW (ref 3.5–5.0)
Alkaline Phosphatase: 36 U/L — ABNORMAL LOW (ref 38–126)
Anion gap: 8 (ref 5–15)
BUN: 8 mg/dL (ref 8–23)
CO2: 22 mmol/L (ref 22–32)
Calcium: 8.1 mg/dL — ABNORMAL LOW (ref 8.9–10.3)
Chloride: 109 mmol/L (ref 98–111)
Creatinine, Ser: 0.7 mg/dL (ref 0.44–1.00)
GFR, Estimated: >60 mL/min (ref >60)
Glucose, Bld: 86 mg/dL (ref 70–99)
Potassium: 3.5 mmol/L (ref 3.5–5.1)
Sodium: 139 mmol/L (ref 135–145)
Total Bilirubin: 0.9 mg/dL (ref 0.3–1.2)
Total Protein: 4.6 g/dL — ABNORMAL LOW (ref 6.5–8.1)

## 2021-05-06 LAB — GLUCOSE, CAPILLARY: Glucose-Capillary: 81 mg/dL (ref 70–99)

## 2021-05-06 LAB — CK: Total CK: 2602 U/L — ABNORMAL HIGH (ref 38–234)

## 2021-05-06 MED ORDER — TRAZODONE HCL 50 MG PO TABS
50.0000 mg | ORAL_TABLET | Freq: Every day | ORAL | Status: DC
  Administered 2021-05-06 – 2021-05-07 (×2): 50 mg via ORAL
  Filled 2021-05-06 (×2): qty 1

## 2021-05-06 NOTE — Progress Notes (Signed)
Mobility Specialist Progress Note:   05/06/21 1112  Mobility  Activity Ambulated with assistance in hallway  Level of Assistance Standby assist, set-up cues, supervision of patient - no hands on  Assistive Device Front wheel walker  Distance Ambulated (ft) 150 ft  Activity Response Tolerated well  $Mobility charge 1 Mobility   Pt received in bed willing to participate in mobility. No complaints of pain. Left in bed with call bell in reach and all needs met.   [facility] Public librarian Phone [phone]

## 2021-05-06 NOTE — Progress Notes (Signed)
PROGRESS NOTE  [name]  ZOX:[id number] DOB: 1955-07-19 DOA: 05/03/2021 PCP: Lujean Amel, MD   Brief Narrative: Patient is a 66 year old female with history of anxiety, bipolar disorder, depression, sinus pauses status post pacemaker placement, cervical cancer, chronic pain syndrome who presented to the ED after a fall at home.  She apparently fell 2 days ago and was on the ground for 2 days.  She does not remember any events preceding the fall.  She could not get up.  Her daughter found her on the floor and called 911.  She takes oxycodone and Xanax at home.  On condition she was tachycardic.  Lab work showed elevated CK, elevated LFT, leukocytosis.  Skeletal survey, CT head, cervical spine did not show any acute fracture or dislocation or any other acute abnormalities.  Patient was started on IV fluids, PT/OT ordered.  Therapies have recommended home health.  Her mental status has improved and she is currently alert and oriented.  Not discharged today due to elevated CK, continue IV fluids for today, possible discharge tomorrow   Assessment & Plan:  Principal Problem:   fall with rhabdomyloysis  Active Problems:   Bipolar disorder ([place])   Depression with anxiety   Abnormal liver function test   Fall   Leukocytosis   Chronic pain   Underweight   Lactic acidosis   Electrolyte abnormality   Altered mental status   Assessment and Plan: * fall with rhabdomyloysis - (present on admission) Fall on floor at home x 2 days, found by her daughter and presenting to ED in rhabdomyolysis. Unsure how she fell and she can not remember -Past history of syncopal episodes due to sinus pauses s/p [employer] PPM -Pacemaker has been interrogated without finding of any significant arrhythmia -Echo showed EF of 60 to 81%, grade 1 diastolic dysfunction. -Orthostatic vitals  boderline -can not do MRI with PPM and no neurological deficits on exam. Will continue with frequent neuro checks -Continue IV  fluids for elevated CK -Polypharmacy could be the contributing factor too.  She was on oxycodone, Xanax, hydroxyzine -PT/OT recommending home health on discharge.  Patient was living alone, daughter is saying that she will come to live with her now -CK elevated in the range of 4K, will continue IV fluids for today, check level tomorrow.   Altered mental status Patient was admitted here on December 2022 with the same problem : she was found on the floor and confused.  At that time psychiatry and neurology were consulted.  She had some improvement in the mental status and was sent back to home with home health.  Vitamin B12 was normal.  CT head did not show any acute intracranial abnormalities CT head done on this admission also did not show any acute problems.  As per the daughter, she has been confused and forgetful intermittently.  Possibility of  new onset dementia.  Mental status has significantly improved today and she was alert and oriented.   Electrolyte abnormality Noted to be hypokalemic and hypocalcemic on 2/16 Supplemented and corrected  Lactic acidosis Likely secondary to hypovolemia No signs of infection resolved  Underweight- (present on admission) Nutrition consult   Chronic pain On oxycodone 5mg  Tid prn and PMP webside verified  Will continue this   Leukocytosis- (present on admission) Likely reactive, hemoconcentrated Improving  Abnormal liver function test- (present on admission) -AST to 105 on admission -Likely secondary to rhabdomyolysis -continue IVF and monitor   Depression with anxiety- (present on admission) Continue xanax .5mg   Tid prn .  She was seen by psychiatry on last hospitalization and recommended to continue these medicine on DC  Bipolar disorder ([place])- (present on admission) Continue Risperdal   Persistent rhabdomyolysis CPK greater than 2600 Continue IV fluid hydration Continue to monitor urine output Repeat CPK in the  morning  Nutrition Problem: Moderate Malnutrition Etiology: social / environmental circumstances (prolonged inadequate intake)    DVT prophylaxis:SCDs Start: 05/03/21 1949     Code Status: Full Code  Family Communication: Daughter on phone on 2/16  Patient status:Inpatient  Patient is from :Home  Anticipated discharge to: Home with home health possibly on Monday, pending improvement of CPK.  Estimated DC date: Tomorrow, expecting CK to trend down   Consultants: None  Procedures: None  Antimicrobials:  Anti-infectives (From admission, onward)    None       Subjective: Patient was seen and examined at her bedside.  She wants to go home.  She has no other complaints.  Objective: Vitals:   05/05/21 2044 05/06/21 0509 05/06/21 0531 05/06/21 0843  BP: 134/70 134/67  133/72  Pulse: 77 65  74  Resp: 17 18  18   Temp: 98.1 F (36.7 C) 98.6 F (37 C)  98.2 F (36.8 C)  TempSrc: Oral Oral  Oral  SpO2: 97% 96%  99%  Weight:   53.1 kg   Height:        Intake/Output Summary (Last 24 hours) at 05/06/2021 1235 Last data filed at 05/06/2021 0531 Gross per 24 hour  Intake 3731.25 ml  Output 1351 ml  Net 2380.25 ml   Filed Weights   05/04/21 0729 05/06/21 0531  Weight: 54.4 kg 53.1 kg    Examination:   General exam: Frail-appearing in no acute distress.  She is alert and oriented x3. HEENT: Left periorbital bruising. Respiratory system: Clear to auscultation no wheeze or rales. Cardiovascular system: Regular rate and rhythm no rubs or gallops. Gastrointestinal system: Soft nontender normal bowel sounds. Central nervous system: Alert and oriented x3. Extremities: No lower extremity edema bilaterally. Skin: Bruising left periorbital.  Data Reviewed: I have personally reviewed following labs and imaging studies  CBC: Recent Labs  Lab 05/03/21 1522 05/04/21 0445 05/05/21 0436  WBC 18.5* 14.0* 13.5*  NEUTROABS 16.0*  --  9.3*  HGB 16.7* 12.0 11.7*  HCT  52.1* 38.5 35.5*  MCV 87.3 90.4 88.1  PLT 358 229 834   Basic Metabolic Panel: Recent Labs  Lab 05/03/21 1522 05/03/21 1949 05/04/21 0445 05/05/21 0436 05/06/21 0054  NA 143  --  141 137 139  K 3.8  --  3.2* 3.9 3.5  CL 107  --  118* 112* 109  CO2 23  --  16* 21* 22  GLUCOSE 111*  --  85 95 86  BUN 21  --  13 13 8   CREATININE 1.06*  --  0.56 0.78 0.70  CALCIUM 9.4  --  6.6* 7.9* 8.1*  MG  --  2.0  --   --   --   PHOS  --  3.1  --   --   --      Recent Results (from the past 240 hour(s))  Resp Panel by RT-PCR (Flu A&B, Covid) Nasopharyngeal Swab     Status: None   Collection Time: 05/03/21  2:25 PM   Specimen: Nasopharyngeal Swab; Nasopharyngeal(NP) swabs in vial transport medium  Result Value Ref Range Status   SARS Coronavirus 2 by RT PCR NEGATIVE NEGATIVE Final    Comment: (NOTE)  SARS-CoV-2 target nucleic acids are NOT DETECTED.  The SARS-CoV-2 RNA is generally detectable in upper respiratory specimens during the acute phase of infection. The lowest concentration of SARS-CoV-2 viral copies this assay can detect is 138 copies/mL. A negative result does not preclude SARS-Cov-2 infection and should not be used as the sole basis for treatment or other patient management decisions. A negative result may occur with  improper specimen collection/handling, submission of specimen other than nasopharyngeal swab, presence of viral mutation(s) within the areas targeted by this assay, and inadequate number of viral copies(<138 copies/mL). A negative result must be combined with clinical observations, patient history, and epidemiological information. The expected result is Negative.  Fact Sheet for Patients:  EntrepreneurPulse.com.au  Fact Sheet for Healthcare Providers:  IncredibleEmployment.be  This test is no t yet approved or cleared by the [place] FDA and  has been authorized for detection and/or diagnosis of SARS-CoV-2 by FDA  under an Emergency Use Authorization (EUA). This EUA will remain  in effect (meaning this test can be used) for the duration of the COVID-19 declaration under Section 564(b)(1) of the Act, 21 U.S.C.section 360bbb-3(b)(1), unless the authorization is terminated  or revoked sooner.       Influenza A by PCR NEGATIVE NEGATIVE Final   Influenza B by PCR NEGATIVE NEGATIVE Final    Comment: (NOTE) The Xpert Xpress SARS-CoV-2/FLU/RSV plus assay is intended as an aid in the diagnosis of influenza from Nasopharyngeal swab specimens and should not be used as a sole basis for treatment. Nasal washings and aspirates are unacceptable for Xpert Xpress SARS-CoV-2/FLU/RSV testing.  Fact Sheet for Patients: EntrepreneurPulse.com.au  Fact Sheet for Healthcare Providers: IncredibleEmployment.be  This test is not yet approved or cleared by the [place] FDA and has been authorized for detection and/or diagnosis of SARS-CoV-2 by FDA under an Emergency Use Authorization (EUA). This EUA will remain in effect (meaning this test can be used) for the duration of the COVID-19 declaration under Section 564(b)(1) of the Act, 21 U.S.C. section 360bbb-3(b)(1), unless the authorization is terminated or revoked.  Performed at [facility], [address], [place], [place] [address]      Radiology Studies: No results found.  Scheduled Meds:  feeding supplement  1 Container Oral TID BM   multivitamin with minerals  1 tablet Oral Daily   risperiDONE  3 mg Oral QHS   sodium chloride flush  3 mL Intravenous Q12H   traZODone  50 mg Oral QHS   Continuous Infusions:  sodium chloride 100 mL/hr at 05/06/21 0958     LOS: 2 days   Kayleen Memos, MD [facility] P2/18/2023, 12:35 PM

## 2021-05-06 NOTE — Progress Notes (Signed)
Physical Therapy Treatment Patient Details Name: [name] MRN: [id number] DOB: 07/26/1955 Today's Date: 05/06/2021   History of Present Illness [name] is a 66 y.o. female with medical history significant of anxiety, bipolar disorder, depression, sinus pauses s/p PPM, cervical cancer, chronic pain with h/o back and neck surgery who was admitted after found on the floor by her daughter where she was for likely 2 days due to syncope versus fall.    PT Comments    Pt tolerates treatment well, ambulating for household distances. Pt is able to negotiate stairs with minG for safety and use of rail, expressing confidence in her ability to manage steps to enter her apartment. Pt will benefit from continued acute PT services to aide in a return to independence.   Recommendations for follow up therapy are one component of a multi-disciplinary discharge planning process, led by the attending physician.  Recommendations may be updated based on patient status, additional functional criteria and insurance authorization.  Follow Up Recommendations  Home health PT     Assistance Recommended at Discharge Frequent or constant Supervision/Assistance  Patient can return home with the following A little help with walking and/or transfers;A little help with bathing/dressing/bathroom;Assistance with cooking/housework;Direct supervision/assist for medications management;Assist for transportation;Help with stairs or ramp for entrance   Equipment Recommendations  BSC/3in1    Recommendations for Other Services       Precautions / Restrictions Precautions Precautions: Fall Precaution Comments: prev syncopal events Restrictions Weight Bearing Restrictions: No     Mobility  Bed Mobility Overal bed mobility: Needs Assistance Bed Mobility: Rolling, Sidelying to Sit Rolling: Supervision Sidelying to sit: Min guard       General bed mobility comments: verbal cues for technique     Transfers Overall transfer level: Needs assistance Equipment used: Rolling walker (2 wheels) Transfers: Sit to/from Stand Sit to Stand: Min guard           General transfer comment: verbal cues for trunk flexion and hand placement    Ambulation/Gait Ambulation/Gait assistance: Supervision Gait Distance (Feet): 250 Feet Assistive device: Rolling walker (2 wheels) Gait Pattern/deviations: Step-through pattern Gait velocity: functional Gait velocity interpretation: >2.62 ft/sec, indicative of community ambulatory   General Gait Details: pt with step-through gait, drifts laterally, bumping into wall once but without loss of balance   Stairs Stairs: Yes Stairs assistance: Min guard Stair Management: One rail Left, Forwards, Alternating pattern Number of Stairs: 5 (pt descends and ascends 5 steps 2 time consecutively)     Wheelchair Mobility    Modified Rankin (Stroke Patients Only)       Balance Overall balance assessment: Needs assistance Sitting-balance support: No upper extremity supported, Feet supported Sitting balance-Leahy Scale: Good     Standing balance support: Single extremity supported, Bilateral upper extremity supported, Reliant on assistive device for balance Standing balance-Leahy Scale: Poor                              Cognition Arousal/Alertness: Awake/alert Behavior During Therapy: WFL for tasks assessed/performed Overall Cognitive Status: Impaired/Different from baseline Area of Impairment: Problem solving                             Problem Solving: Slow processing          Exercises      General Comments General comments (skin integrity, edema, etc.): VSS on RA  Pertinent Vitals/Pain Pain Assessment Pain Assessment: No/denies pain    Home Living                          Prior Function            PT Goals (current goals can now be found in the care plan section) Acute Rehab PT  Goals Patient Stated Goal: to return home with daughter assist Progress towards PT goals: Progressing toward goals    Frequency    Min 3X/week      PT Plan Current plan remains appropriate    Co-evaluation              AM-PAC PT "6 Clicks" Mobility   Outcome Measure  Help needed turning from your back to your side while in a flat bed without using bedrails?: A Little Help needed moving from lying on your back to sitting on the side of a flat bed without using bedrails?: A Little Help needed moving to and from a bed to a chair (including a wheelchair)?: A Little Help needed standing up from a chair using your arms (e.g., wheelchair or bedside chair)?: A Little Help needed to walk in hospital room?: A Little Help needed climbing 3-5 steps with a railing? : A Little 6 Click Score: 18    End of Session   Activity Tolerance: Patient tolerated treatment well Patient left: in bed;with call bell/phone within reach;with bed alarm set Nurse Communication: Mobility status PT Visit Diagnosis: Other abnormalities of gait and mobility (R26.89);History of falling (Z91.81);Other symptoms and signs involving the nervous system (Z61.096)     Time: 0454-0981 PT Time Calculation (min) (ACUTE ONLY): 19 min  Charges:  $Gait Training: 8-22 mins                     Zenaida Niece, PT, DPT Acute Rehabilitation Pager: [phone] Office [place] Tyah Acord 05/06/2021, 8:50 AM

## 2021-05-07 LAB — CBC
HCT: 33.9 % — ABNORMAL LOW (ref 36.0–46.0)
Hemoglobin: 11.6 g/dL — ABNORMAL LOW (ref 12.0–15.0)
MCH: 29.2 pg (ref 26.0–34.0)
MCHC: 34.2 g/dL (ref 30.0–36.0)
MCV: 85.4 fL (ref 80.0–100.0)
Platelets: 230 10*3/uL (ref 150–400)
RBC: 3.97 MIL/uL (ref 3.87–5.11)
RDW: 16.8 % — ABNORMAL HIGH (ref 11.5–15.5)
WBC: 9.1 10*3/uL (ref 4.0–10.5)
nRBC: 0 % (ref 0.0–0.2)

## 2021-05-07 LAB — COMPREHENSIVE METABOLIC PANEL
ALT: 22 U/L (ref 0–44)
AST: 51 U/L — ABNORMAL HIGH (ref 15–41)
Albumin: 2.6 g/dL — ABNORMAL LOW (ref 3.5–5.0)
Alkaline Phosphatase: 35 U/L — ABNORMAL LOW (ref 38–126)
Anion gap: 7 (ref 5–15)
BUN: 5 mg/dL — ABNORMAL LOW (ref 8–23)
CO2: 23 mmol/L (ref 22–32)
Calcium: 7.9 mg/dL — ABNORMAL LOW (ref 8.9–10.3)
Chloride: 110 mmol/L (ref 98–111)
Creatinine, Ser: 0.64 mg/dL (ref 0.44–1.00)
GFR, Estimated: >60 mL/min (ref >60)
Glucose, Bld: 90 mg/dL (ref 70–99)
Potassium: 3.2 mmol/L — ABNORMAL LOW (ref 3.5–5.1)
Sodium: 140 mmol/L (ref 135–145)
Total Bilirubin: 1 mg/dL (ref 0.3–1.2)
Total Protein: 4.6 g/dL — ABNORMAL LOW (ref 6.5–8.1)

## 2021-05-07 LAB — CK: Total CK: 1085 U/L — ABNORMAL HIGH (ref 38–234)

## 2021-05-07 LAB — GLUCOSE, CAPILLARY: Glucose-Capillary: 88 mg/dL (ref 70–99)

## 2021-05-07 MED ORDER — POTASSIUM CHLORIDE CRYS ER 20 MEQ PO TBCR
40.0000 meq | EXTENDED_RELEASE_TABLET | Freq: Once | ORAL | Status: AC
  Administered 2021-05-07: 40 meq via ORAL
  Filled 2021-05-07: qty 2

## 2021-05-07 NOTE — Progress Notes (Addendum)
PROGRESS NOTE  [name]  BOF:[id number] DOB: 08-22-1955 DOA: 05/03/2021 PCP: Lujean Amel, MD   Brief Narrative: Patient is a 66 year old female with history of anxiety, bipolar disorder, depression, sinus pauses status post pacemaker placement, cervical cancer, chronic pain syndrome who presented to the ED after a fall at home.  She apparently fell 2 days ago and was on the ground for 2 days.  She does not remember any events preceding the fall.  She could not get up.  Her daughter found her on the floor and called 911.  She takes oxycodone and Xanax at home.  On condition she was tachycardic.  Lab work showed elevated CK, elevated LFT, leukocytosis.  Skeletal survey, CT head, cervical spine did not show any acute fracture or dislocation or any other acute abnormalities.  Patient was started on IV fluids, PT/OT ordered.  Therapies have recommended home health.  Her mental status has improved and she is currently alert and oriented.  Not discharged today due to elevated CK, continue IV fluids for today, possible discharge tomorrow   05/07/2021: Patient was seen at her bedside.  She has no new complaints.  There were no acute events overnight.  CPK level is downtrending.  She is on IV fluid.  She wants to go home.  Will likely discharge to home with home health PT OT on 05/08/2021.     Assessment & Plan:  Principal Problem:   fall with rhabdomyloysis  Active Problems:   Bipolar disorder ([place])   Depression with anxiety   Abnormal liver function test   Fall   Leukocytosis   Chronic pain   Underweight   Lactic acidosis   Electrolyte abnormality   Altered mental status   Assessment and Plan: * fall with rhabdomyloysis - (present on admission) Fall on floor at home x 2 days, found by her daughter and presenting to ED in rhabdomyolysis. Unsure how she fell and she can not remember -Past history of syncopal episodes due to sinus pauses s/p [employer] PPM -Pacemaker has been interrogated  without finding of any significant arrhythmia -Echo showed EF of 60 to 77%, grade 1 diastolic dysfunction. -Orthostatic vitals  boderline -can not do MRI with PPM and no neurological deficits on exam. Will continue with frequent neuro checks -Continue IV fluids for elevated CK -Polypharmacy could be the contributing factor too.  She was on oxycodone, Xanax, hydroxyzine -PT/OT recommending home health on discharge.  Patient was living alone, daughter is saying that she will come to live with her now -CK elevated in the range of 4K, will continue IV fluids for today, check level tomorrow.   Altered mental status Patient was admitted here on December 2022 with the same problem : she was found on the floor and confused.  At that time psychiatry and neurology were consulted.  She had some improvement in the mental status and was sent back to home with home health.  Vitamin B12 was normal.  CT head did not show any acute intracranial abnormalities CT head done on this admission also did not show any acute problems.  As per the daughter, she has been confused and forgetful intermittently.  Possibility of  new onset dementia.  Mental status has significantly improved today and she was alert and oriented.   Electrolyte abnormality Noted to be hypokalemic and hypocalcemic on 2/16 Supplemented and corrected  Lactic acidosis Likely secondary to hypovolemia No signs of infection resolved  Underweight- (present on admission) Nutrition consult   Chronic pain On  oxycodone 5mg  Tid prn and PMP webside verified  Will continue this   Leukocytosis- (present on admission) Likely reactive, hemoconcentrated Improving  Abnormal liver function test- (present on admission) -AST to 105 on admission -Likely secondary to rhabdomyolysis -continue IVF and monitor   Depression with anxiety- (present on admission) Continue xanax .5mg  Tid prn .  She was seen by psychiatry on last hospitalization and  recommended to continue these medicine on DC  Bipolar disorder ([place])- (present on admission) Continue Risperdal   Improving rhabdomyolysis CPK greater than 2600> 1085 Continue IV fluid hydration Continue to monitor urine output Repeat CPK in the morning  Refractory hypokalemia Serum potassium 3.2 Repleted orally with KCl 40 mEq x 2 doses on 05/07/2021  Physical debility PT OT assessed and recommended home health PT OT TOC consulted to assist with DC planning. Continue fall precautions.  Nutrition Problem: Moderate Malnutrition Etiology: social / environmental circumstances (prolonged inadequate intake)    DVT prophylaxis:SCDs Start: 05/03/21 1949     Code Status: Full Code  Family Communication: Daughter on phone on 2/16  Patient status:Inpatient  Patient is from :Home  Anticipated discharge to: Home with home health possibly on Monday, pending improvement of CPK.  Estimated DC date: Tomorrow, expecting CK to trend down   Consultants: None  Procedures: None  Antimicrobials:  Anti-infectives (From admission, onward)    None      Objective: Vitals:   05/06/21 1556 05/06/21 2005 05/07/21 0549 05/07/21 0818  BP: 122/72 128/74 125/66 (!) 141/69  Pulse: 71 66 62 67  Resp: 18 18 18 18   Temp: 98.9 F (37.2 C) 98.5 F (36.9 C) 97.9 F (36.6 C) 98.5 F (36.9 C)  TempSrc: Oral Oral  Oral  SpO2: 99%  96% 95%  Weight:      Height:        Intake/Output Summary (Last 24 hours) at 05/07/2021 1220 Last data filed at 05/07/2021 1000 Gross per 24 hour  Intake 2769.49 ml  Output 1850 ml  Net 919.49 ml   Filed Weights   05/04/21 0729 05/06/21 0531  Weight: 54.4 kg 53.1 kg    Examination:   General exam: Frail-appearing no acute distress she is alert and oriented x3.   HEENT: Left periorbital bruising. Respiratory system: Clear to auscultation no wheezes or rales.   Cardiovascular system: Regular rate and rhythm no rubs or gallops. Gastrointestinal  system: Soft nontender normal bowel sounds present.   Central nervous system: Alert and oriented x3. Extremities: No lower extremity edema bilaterally. Skin: Bruising left periorbital.  Data Reviewed: I have personally reviewed following labs and imaging studies  CBC: Recent Labs  Lab 05/03/21 1522 05/04/21 0445 05/05/21 0436 05/07/21 0622  WBC 18.5* 14.0* 13.5* 9.1  NEUTROABS 16.0*  --  9.3*  --   HGB 16.7* 12.0 11.7* 11.6*  HCT 52.1* 38.5 35.5* 33.9*  MCV 87.3 90.4 88.1 85.4  PLT 358 229 225 297   Basic Metabolic Panel: Recent Labs  Lab 05/03/21 1522 05/03/21 1949 05/04/21 0445 05/05/21 0436 05/06/21 0054 05/07/21 0622  NA 143  --  141 137 139 140  K 3.8  --  3.2* 3.9 3.5 3.2*  CL 107  --  118* 112* 109 110  CO2 23  --  16* 21* 22 23  GLUCOSE 111*  --  85 95 86 90  BUN 21  --  13 13 8  5*  CREATININE 1.06*  --  0.56 0.78 0.70 0.64  CALCIUM 9.4  --  6.6* 7.9* 8.1*  7.9*  MG  --  2.0  --   --   --   --   PHOS  --  3.1  --   --   --   --      Recent Results (from the past 240 hour(s))  Resp Panel by RT-PCR (Flu A&B, Covid) Nasopharyngeal Swab     Status: None   Collection Time: 05/03/21  2:25 PM   Specimen: Nasopharyngeal Swab; Nasopharyngeal(NP) swabs in vial transport medium  Result Value Ref Range Status   SARS Coronavirus 2 by RT PCR NEGATIVE NEGATIVE Final    Comment: (NOTE) SARS-CoV-2 target nucleic acids are NOT DETECTED.  The SARS-CoV-2 RNA is generally detectable in upper respiratory specimens during the acute phase of infection. The lowest concentration of SARS-CoV-2 viral copies this assay can detect is 138 copies/mL. A negative result does not preclude SARS-Cov-2 infection and should not be used as the sole basis for treatment or other patient management decisions. A negative result may occur with  improper specimen collection/handling, submission of specimen other than nasopharyngeal swab, presence of viral mutation(s) within the areas targeted by  this assay, and inadequate number of viral copies(<138 copies/mL). A negative result must be combined with clinical observations, patient history, and epidemiological information. The expected result is Negative.  Fact Sheet for Patients:  EntrepreneurPulse.com.au  Fact Sheet for Healthcare Providers:  IncredibleEmployment.be  This test is no t yet approved or cleared by the [place] FDA and  has been authorized for detection and/or diagnosis of SARS-CoV-2 by FDA under an Emergency Use Authorization (EUA). This EUA will remain  in effect (meaning this test can be used) for the duration of the COVID-19 declaration under Section 564(b)(1) of the Act, 21 U.S.C.section 360bbb-3(b)(1), unless the authorization is terminated  or revoked sooner.       Influenza A by PCR NEGATIVE NEGATIVE Final   Influenza B by PCR NEGATIVE NEGATIVE Final    Comment: (NOTE) The Xpert Xpress SARS-CoV-2/FLU/RSV plus assay is intended as an aid in the diagnosis of influenza from Nasopharyngeal swab specimens and should not be used as a sole basis for treatment. Nasal washings and aspirates are unacceptable for Xpert Xpress SARS-CoV-2/FLU/RSV testing.  Fact Sheet for Patients: EntrepreneurPulse.com.au  Fact Sheet for Healthcare Providers: IncredibleEmployment.be  This test is not yet approved or cleared by the [place] FDA and has been authorized for detection and/or diagnosis of SARS-CoV-2 by FDA under an Emergency Use Authorization (EUA). This EUA will remain in effect (meaning this test can be used) for the duration of the COVID-19 declaration under Section 564(b)(1) of the Act, 21 U.S.C. section 360bbb-3(b)(1), unless the authorization is terminated or revoked.  Performed at [facility], [place] [address] [place], [place] [address]      Radiology Studies: No results found.  Scheduled Meds:  feeding  supplement  1 Container Oral TID BM   multivitamin with minerals  1 tablet Oral Daily   risperiDONE  3 mg Oral QHS   sodium chloride flush  3 mL Intravenous Q12H   traZODone  50 mg Oral QHS   Continuous Infusions:  sodium chloride 100 mL/hr at 05/07/21 0800     LOS: 3 days   Kayleen Memos, MD [facility] P2/19/2023, 12:20 PM

## 2021-05-07 NOTE — Progress Notes (Signed)
Pt. Was seen to work on adl transfers and adls. Pt. Is making steady gains. Pt. Ed on srom of l shld to increase rom. Acute ot to follow.   05/07/21 1300  OT Visit Information  Last OT Received On 05/07/21  Assistance Needed +1  History of Present Illness Tabitha DRENNAN is a 66 y.o. female with medical history significant of anxiety, bipolar disorder, depression, sinus pauses s/p PPM, cervical cancer, chronic pain with h/o back and neck surgery who was admitted after found on the floor by her daughter where she was for likely 2 days due to syncope versus fall.  Precautions  Precautions Fall  Precaution Comments prev syncopal events  Restrictions  Weight Bearing Restrictions No  Pain Assessment  Pain Assessment No/denies pain  Cognition  Arousal/Alertness Awake/alert  Behavior During Therapy WFL for tasks assessed/performed  Upper Extremity Assessment  LUE Deficits / Details pt. reports pain wtih shld elevation since fall.  LUE Coordination decreased gross motor  Lower Extremity Assessment  Lower Extremity Assessment Defer to PT evaluation  Vision- Assessment  Vision Assessment? No apparent visual deficits  ADL  Overall ADL's  Needs assistance/impaired  Eating/Feeding Independent;Sitting  Grooming Wash/dry hands;Wash/dry face;Oral care;Supervision/safety;Standing  Upper Body Bathing Minimal assistance;Sitting  Lower Body Bathing Minimal assistance;Sit to/from stand  Upper Body Dressing  Set up;Sitting  Lower Body Dressing Sit to/from stand;Minimal assistance  Toilet Transfer Min guard;Ambulation  Toileting- Forensic psychologist Min guard;Sit to/from stand  Functional mobility during ADLs Min guard;Rolling walker (2 wheels)  General ADL Comments pt. is limited by l shld rom.  Transfers  Transfers Sit to/from Stand  Sit to [employer] transfer comment cues for proper hand placement  Balance  Sitting balance-Leahy Scale Good  Standing balance-Leahy Scale  Fair  Exercises  Exercises  (Pt. ed to use r ue to assist with flex of l ue. pt. ed to preform 4 to 5  reps 2 to 3 times daily.)  OT - End of Session  Equipment Utilized During Treatment Rolling walker (2 wheels);Gait belt  Activity Tolerance Patient tolerated treatment well  Patient left in chair;with call bell/phone within reach;with chair alarm set  Nurse Communication  (ok therapy)  OT Assessment/Plan  OT Plan Discharge plan remains appropriate  OT Visit Diagnosis Unsteadiness on feet (R26.81);Other abnormalities of gait and mobility (R26.89);Muscle weakness (generalized) (M62.81);Other symptoms and signs involving cognitive function  OT Frequency (ACUTE ONLY) Min 2X/week  Follow Up Recommendations Home health OT  Assistance recommended at discharge Frequent or constant Supervision/Assistance  Patient can return home with the following A little help with walking and/or transfers;A little help with bathing/dressing/bathroom;Assistance with cooking/housework;Direct supervision/assist for medications management;Assist for transportation;Help with stairs or ramp for entrance  OT Equipment OVF/6EP3  AM-PAC OT "6 Clicks" Daily Activity Outcome Measure (Version 2)  Help from another person eating meals? 4  Help from another person taking care of personal grooming? 3  Help from another person toileting, which includes using toliet, bedpan, or urinal? 3  Help from another person bathing (including washing, rinsing, drying)? 3  Help from another person to put on and taking off regular upper body clothing? 3  Help from another person to put on and taking off regular lower body clothing? 3  6 Click Score 19  Progressive Mobility  What is the highest level of mobility based on the progressive mobility assessment? Level 5 (Walks with assist in room/hall) - Balance while stepping forward/back and can walk in room with  assist - Complete  Activity Ambulated with assistance in room  OT Goal  Progression  Progress towards OT goals Progressing toward goals  Acute Rehab OT Goals  OT Goal Formulation With patient  Time For Goal Achievement 05/19/21  Potential to Achieve Goals Good  ADL Goals  Pt Will Perform Grooming with supervision;standing  Pt Will Perform Lower Body Bathing with supervision;sit to/from stand  Pt Will Perform Lower Body Dressing with supervision;sit to/from stand  Pt Will Transfer to Toilet with supervision;ambulating  Pt Will Perform Toileting - Clothing Manipulation and hygiene with supervision;sit to/from stand  Additional ADL Goal #1 Pt will perform bed mobility modified independently in preparation for ADLs.  OT Time Calculation  OT Start Time (ACUTE ONLY) 1305  OT Stop Time (ACUTE ONLY) 1332  OT Time Calculation (min) 27 min  OT General Charges  $OT Visit 1 Visit  OT Treatments  $Self Care/Home Management  23-37 mins  Reece Packer OT/L

## 2021-05-07 NOTE — Plan of Care (Signed)
°  Problem: Nutrition: Goal: Adequate nutrition will be maintained Outcome: Not Progressing   Note: Patient eating less than 50% of meals.   Patient repeatedly refusing nutritional supplement drinks (boost) and advised RN that she doesn't like these drinks and will not drink them.  Order discontinued.

## 2021-05-08 ENCOUNTER — Ambulatory Visit (INDEPENDENT_AMBULATORY_CARE_PROVIDER_SITE_OTHER): Payer: Medicare Other

## 2021-05-08 DIAGNOSIS — I495 Sick sinus syndrome: Secondary | ICD-10-CM

## 2021-05-08 LAB — COMPREHENSIVE METABOLIC PANEL
ALT: 25 U/L (ref 0–44)
AST: 40 U/L (ref 15–41)
Albumin: 2.7 g/dL — ABNORMAL LOW (ref 3.5–5.0)
Alkaline Phosphatase: 36 U/L — ABNORMAL LOW (ref 38–126)
Anion gap: 7 (ref 5–15)
BUN: 6 mg/dL — ABNORMAL LOW (ref 8–23)
CO2: 23 mmol/L (ref 22–32)
Calcium: 8.5 mg/dL — ABNORMAL LOW (ref 8.9–10.3)
Chloride: 111 mmol/L (ref 98–111)
Creatinine, Ser: 0.64 mg/dL (ref 0.44–1.00)
GFR, Estimated: >60 mL/min (ref >60)
Glucose, Bld: 90 mg/dL (ref 70–99)
Potassium: 4.1 mmol/L (ref 3.5–5.1)
Sodium: 141 mmol/L (ref 135–145)
Total Bilirubin: 0.5 mg/dL (ref 0.3–1.2)
Total Protein: 4.9 g/dL — ABNORMAL LOW (ref 6.5–8.1)

## 2021-05-08 LAB — CUP PACEART REMOTE DEVICE CHECK
Battery Impedance: 503 Ohm
Battery Remaining Longevity: 101 mo
Battery Voltage: 2.78 V
Brady Statistic AP VP Percent: 0 %
Brady Statistic AP VS Percent: 0 %
Brady Statistic AS VP Percent: 2 %
Brady Statistic AS VS Percent: 98 %
Date Time Interrogation Session: 20230216082801
Implantable Lead Implant Date: 20141205
Implantable Lead Implant Date: 20141205
Implantable Lead Location: 753859
Implantable Lead Location: 753860
Implantable Lead Model: 5076
Implantable Lead Model: 5076
Implantable Pulse Generator Implant Date: 20141205
Lead Channel Impedance Value: 407 Ohm
Lead Channel Impedance Value: 440 Ohm
Lead Channel Pacing Threshold Amplitude: 0.375 V
Lead Channel Pacing Threshold Amplitude: 1 V
Lead Channel Pacing Threshold Pulse Width: 0.4 ms
Lead Channel Pacing Threshold Pulse Width: 0.4 ms
Lead Channel Setting Pacing Amplitude: 2 V
Lead Channel Setting Pacing Amplitude: 2.5 V
Lead Channel Setting Pacing Pulse Width: 0.4 ms
Lead Channel Setting Sensing Sensitivity: 2 mV

## 2021-05-08 LAB — GLUCOSE, CAPILLARY: Glucose-Capillary: 91 mg/dL (ref 70–99)

## 2021-05-08 LAB — CK: Total CK: 691 U/L — ABNORMAL HIGH (ref 38–234)

## 2021-05-08 MED ORDER — ADULT MULTIVITAMIN W/MINERALS CH: 1.0000 | ORAL_TABLET | Freq: Every day | ORAL | 0 refills | Status: AC

## 2021-05-08 NOTE — Progress Notes (Signed)
Mobility Specialist Progress Note:   05/08/21 1039  Mobility  Activity Ambulated with assistance in hallway  Level of Assistance Modified independent, requires aide device or extra time  Assistive Device Front wheel walker  Distance Ambulated (ft) 180 ft  Activity Response Tolerated well  $Mobility charge 1 Mobility   Pt received in bed willing to participate in mobility. No complaints of pain and asymptomatic. Pt left in bed with call bell in reach and all needs met.   [facility] Public librarian Phone [phone]

## 2021-05-08 NOTE — Discharge Summary (Addendum)
Discharge Summary  [name] EXB:[id number] DOB: Jan 07, 1956  PCP: Lujean Amel, MD  Admit date: 05/03/2021 Discharge date: 05/08/2021  Time spent: 35 minutes.  Recommendations for Outpatient Follow-up:  Follow-up with your primary care provider. Take your medications as prescribed. Stable.  Continue PT OT with assistance and fall precautions.  Discharge Diagnoses:  Active Hospital Problems   Diagnosis Date Noted   fall with rhabdomyloysis  05/03/2021   Electrolyte abnormality 05/04/2021   Altered mental status 05/04/2021   Leukocytosis 05/03/2021   Chronic pain 05/03/2021   Underweight 05/03/2021   Lactic acidosis 05/03/2021   Fall 12/18/2019   Abnormal liver function test    Depression with anxiety 07/02/2019   Bipolar disorder ([place]) 04/16/2018    Resolved Hospital Problems  No resolved problems to display.    Discharge Condition: Stable.  Diet recommendation: Resume previous diet.  Vitals:   05/08/21 0754 05/08/21 1300  BP: 139/78 138/75  Pulse: 69 68  Resp: 16 18  Temp: 97.7 F (36.5 C)   SpO2: 97%     History of present illness:  Patient is a 66 year old female with history of anxiety, bipolar disorder, depression, sinus pauses status post pacemaker placement, cervical cancer, chronic pain syndrome who presented to the ED after a fall at home.  She apparently fell 2 days prior to her presentation and was on the ground for 2 days.  She does not remember any events preceding the fall.  Lab work showed elevated CK, elevated LFT, leukocytosis.  Skeletal survey, CT head, cervical spine did not show any acute fracture or dislocation.  Patient was started on IV fluids, PT/OT ordered.  Therapies have recommended home health PT OT.  Her mental status has improved and she is currently alert and oriented x3.  Presenting CPK greater than 6100.  At the time of discharge CPK 691.  Patient wants to go home.  Advised to stay hydrated.  She understands and agrees with the  plan.   05/08/2021: Patient was seen at her bedside.  No acute events overnight.  She has no new complaints.  She wants to go home.  Hospital Course:  Principal Problem:   fall with rhabdomyloysis  Active Problems:   Bipolar disorder ([place])   Depression with anxiety   Abnormal liver function test   Fall   Leukocytosis   Chronic pain   Underweight   Lactic acidosis   Electrolyte abnormality   Altered mental status  Fall with traumatic rhabdomyloysis - (present on admission) Fall on floor at home x 2 days, found by her daughter and presenting to ED in rhabdomyolysis. Unsure how she fell and she can not remember -Past history of syncopal episodes due to sinus pauses s/p [employer] PPM -Pacemaker has been interrogated without finding of any significant arrhythmia -Echo showed EF of 60 to 10%, grade 1 diastolic dysfunction.   Acute metabolic encephalopathy secondary to dehydration and electrolytes abnormalities. Patient was admitted here on December 2022 with the same problem : she was found on the floor and confused.  At that time psychiatry and neurology were consulted.  She had some improvement in the mental status and was sent back to home with home health.  Vitamin B12 was normal.  CT head did not show any acute intracranial abnormalities CT head done on this admission also did not show any acute problems.  As per the daughter, she has been confused and forgetful intermittently.  Possibility of  new onset dementia.  Mental status has significantly improved today  and she was alert and oriented.  Presenting CPK greater than 6100.  At the time of discharge CPK 691.  Patient wants to go home.  Advised to stay hydrated.  She understands and agrees with the plan.   Electrolyte abnormality Noted to be hypokalemic and hypocalcemic on 2/16 Supplemented and corrected   Elevated lactic acid Likely secondary to hypovolemia No signs of infection resolved, 1.8 from 2.3.   Underweight- (present  on admission) Nutrition consult Encourage increase oral protein calorie intake Liberalize diet.  Chronic pain Follow-up with your provider.   Resolved leukocytosis- (present on admission) Reactive from rhabdomyolysis WBC normalized 9.1 from 18.5.   Resolved abnormal liver function test- (present on admission) -AST to 105 on admission -Likely secondary to rhabdomyolysis LFTs have normalized.   Depression with anxiety- (present on admission) Continue xanax .5mg  Tid prn .  She was seen by psychiatry on last hospitalization and recommended to continue these medicine on DC Follow-up with your provider outpatient.   Bipolar disorder ([place])- (present on admission) Continue home regimen.   Resolved post repletion: Refractory hypokalemia Serum potassium 4.1 from 3.2.   Physical debility PT OT assessed and recommended home health PT OT TOC consulted to assist with DC planning. Continue fall precautions.   Nutrition Problem: Moderate Malnutrition Etiology: social / environmental circumstances (prolonged inadequate intake)        Code Status: Full Code   Discharge Exam: BP 138/75 (BP Location: Left Arm)    Pulse 68    Temp 97.7 F (36.5 C) (Oral)    Resp 18    Ht 5\' 7"  (1.702 m) Comment: per chart review and care everywhere   Wt 53.1 kg    SpO2 97%    BMI 18.33 kg/m  General: 66 y.o. year-old female well developed well nourished in no acute distress.  Alert and oriented x3. Cardiovascular: Regular rate and rhythm with no rubs or gallops.  No thyromegaly or JVD noted.   Respiratory: Clear to auscultation with no wheezes or rales. Good inspiratory effort. Abdomen: Soft nontender nondistended with normal bowel sounds x4 quadrants. Musculoskeletal: No lower extremity edema bilaterally.   Psychiatry: Mood is appropriate for condition and setting  Discharge Instructions You were cared for by a hospitalist during your hospital stay. If you have any questions about your discharge  medications or the care you received while you were in the hospital after you are discharged, you can call the unit and asked to speak with the hospitalist on call if the hospitalist that took care of you is not available. Once you are discharged, your primary care physician will handle any further medical issues. Please note that NO REFILLS for any discharge medications will be authorized once you are discharged, as it is imperative that you return to your primary care physician (or establish a relationship with a primary care physician if you do not have one) for your aftercare needs so that they can reassess your need for medications and monitor your lab values.   Allergies as of 05/08/2021       Reactions   Antibiotic Ear [neomycin-polymyxin-hc]    Other reaction(s): Unknown   Penicillins Other (See Comments)   Yeast infection Did it involve swelling of the face/tongue/throat, SOB, or low BP? No Did it involve sudden or severe rash/hives, skin peeling, or any reaction on the inside of your mouth or nose? No Did you need to seek medical attention at a hospital or doctor's office? Yes When did it last happen? More  than 5 years ago  If all above answers are "NO", may proceed with cephalosporin use.        Medication List     TAKE these medications    acetaminophen 325 MG tablet Commonly known as: TYLENOL Take 2 tablets (650 mg total) by mouth every 6 (six) hours as needed for mild pain (or Fever >/= 101).   ALPRAZolam 1 MG tablet Commonly known as: Xanax Take 0.5 tablets (0.5 mg total) by mouth 3 (three) times daily as needed for anxiety.   [employer] B-12 500 MCG tablet Generic drug: vitamin B-12 Take 500 mcg by mouth daily.   hydrOXYzine 50 MG tablet Commonly known as: ATARAX TAKE 1-2 TABLETS (50-100 MG TOTAL) BY MOUTH AT BEDTIME AS NEEDED. What changed: reasons to take this   multivitamin with minerals Tabs tablet Take 1 tablet by mouth daily.   Oxycodone HCl 10 MG Tabs Take  0.5 tablets (5 mg total) by mouth 3 (three) times daily as needed (pain).   risperiDONE 3 MG tablet Commonly known as: RISPERDAL Take 1 tablet (3 mg total) by mouth at bedtime.               Durable Medical Equipment  (From admission, onward)           Start     Ordered   05/07/21 1222  For home use only DME Bedside commode  Once       Question:  Patient needs a bedside commode to treat with the following condition  Answer:  Ambulatory dysfunction   05/07/21 1221   05/04/21 1311  For home use only DME 3 n 1  Once        05/04/21 1311           Allergies  Allergen Reactions   Antibiotic Ear [Neomycin-Polymyxin-Hc]     Other reaction(s): Unknown   Penicillins Other (See Comments)    Yeast infection Did it involve swelling of the face/tongue/throat, SOB, or low BP? No Did it involve sudden or severe rash/hives, skin peeling, or any reaction on the inside of your mouth or nose? No Did you need to seek medical attention at a hospital or doctor's office? Yes When did it last happen? More than 5 years ago  If all above answers are "NO", may proceed with cephalosporin use.     Follow-up Information     [place] [place] Follow up.   Specialty: Home Health Services Why: [facility] (name changed) Contact information: [place] [address] [phone]         Lujean Amel, MD. Call today.   Specialty: Family Medicine Why: Please call for a posthospital follow-up appointment. Contact information: [place] [address] [phone]                  The results of significant diagnostics from this hospitalization (including imaging, microbiology, ancillary and laboratory) are listed below for reference.    Significant Diagnostic Studies: DG Chest 2 View  Result Date: 05/03/2021 CLINICAL DATA:  Shortness of breath. EXAM: CHEST - 2 VIEW COMPARISON:  March 08, 2021. FINDINGS: The heart size and mediastinal contours are within normal limits. Both lungs are clear. Left-sided pacemaker is unchanged in position. The visualized skeletal structures are unremarkable. IMPRESSION: No active cardiopulmonary disease. Electronically Signed   By: Marijo Conception M.D.   On: 05/03/2021 15:04   CT Head Wo Contrast  Result  Date: 05/03/2021 CLINICAL DATA:  Head trauma, moderate-severe; Neck trauma, dangerous injury mechanism (Age 81-64y); Facial trauma, blunt EXAM: CT HEAD WITHOUT CONTRAST CT MAXILLOFACIAL WITHOUT CONTRAST CT CERVICAL SPINE WITHOUT CONTRAST TECHNIQUE: Multidetector CT imaging of the head, cervical spine, and maxillofacial structures were performed using the standard protocol without intravenous contrast. Multiplanar CT image reconstructions of the cervical spine and maxillofacial structures were also generated. RADIATION DOSE REDUCTION: This exam was performed according to the departmental dose-optimization program which includes automated exposure control, adjustment of the mA and/or kV according to patient size and/or use of iterative reconstruction technique. COMPARISON:  CT head 03/08/2021. CT cervical spine 02/25/2020. FINDINGS: CT HEAD FINDINGS Brain: No evidence of acute infarction, hemorrhage, hydrocephalus, extra-axial collection or mass lesion/mass effect. Vascular: Calcific intracranial atherosclerosis. No hyperdense vessel identified. Skull: No acute fracture. Other: No mastoid effusions. CT MAXILLOFACIAL FINDINGS Osseous: No fracture or mandibular dislocation. No destructive process. Orbits: Negative. No traumatic or inflammatory finding. Sinuses: Right frontal sinus mucosal thickening with frothy secretions. Opacified right frontoethmoidal recess. Otherwise, visualized sinuses are largely clear with mild scattered mucosal thickening. Soft tissues: Left forehead/periorbital and left she edema. CT CERVICAL SPINE FINDINGS Alignment: Similar alignment.  No  substantial sagittal subluxation. Skull base and vertebrae: Vertebral body heights are similar to prior. No evidence of acute fracture. C4-C6 ACDF with solid bony fusion. Soft tissues and spinal canal: No prevertebral fluid or swelling. No visible canal hematoma. Disc levels:  Similar multilevel degenerative change. Upper chest: Visualized lung apices are clear. IMPRESSION: 1. No evidence of acute intracranial abnormality. 2. Left forehead/periorbital and left cheek edema, nonspecific but presumably contusion in the setting trauma. No acute facial fracture. 3. No evidence of acute fracture or traumatic malalignment in the cervical spine. Solid C4-C6 ACDF. Electronically Signed   By: Margaretha Sheffield M.D.   On: 05/03/2021 15:25   CT Cervical Spine Wo Contrast  Result Date: 05/03/2021 CLINICAL DATA:  Head trauma, moderate-severe; Neck trauma, dangerous injury mechanism (Age 63-64y); Facial trauma, blunt EXAM: CT HEAD WITHOUT CONTRAST CT MAXILLOFACIAL WITHOUT CONTRAST CT CERVICAL SPINE WITHOUT CONTRAST TECHNIQUE: Multidetector CT imaging of the head, cervical spine, and maxillofacial structures were performed using the standard protocol without intravenous contrast. Multiplanar CT image reconstructions of the cervical spine and maxillofacial structures were also generated. RADIATION DOSE REDUCTION: This exam was performed according to the departmental dose-optimization program which includes automated exposure control, adjustment of the mA and/or kV according to patient size and/or use of iterative reconstruction technique. COMPARISON:  CT head 03/08/2021. CT cervical spine 02/25/2020. FINDINGS: CT HEAD FINDINGS Brain: No evidence of acute infarction, hemorrhage, hydrocephalus, extra-axial collection or mass lesion/mass effect. Vascular: Calcific intracranial atherosclerosis. No hyperdense vessel identified. Skull: No acute fracture. Other: No mastoid effusions. CT MAXILLOFACIAL FINDINGS Osseous: No fracture or  mandibular dislocation. No destructive process. Orbits: Negative. No traumatic or inflammatory finding. Sinuses: Right frontal sinus mucosal thickening with frothy secretions. Opacified right frontoethmoidal recess. Otherwise, visualized sinuses are largely clear with mild scattered mucosal thickening. Soft tissues: Left forehead/periorbital and left she edema. CT CERVICAL SPINE FINDINGS Alignment: Similar alignment.  No substantial sagittal subluxation. Skull base and vertebrae: Vertebral body heights are similar to prior. No evidence of acute fracture. C4-C6 ACDF with solid bony fusion. Soft tissues and spinal canal: No prevertebral fluid or swelling. No visible canal hematoma. Disc levels:  Similar multilevel degenerative change. Upper chest: Visualized lung apices are clear. IMPRESSION: 1. No evidence of acute intracranial abnormality. 2. Left forehead/periorbital and left cheek edema, nonspecific but presumably  contusion in the setting trauma. No acute facial fracture. 3. No evidence of acute fracture or traumatic malalignment in the cervical spine. Solid C4-C6 ACDF. Electronically Signed   By: Margaretha Sheffield M.D.   On: 05/03/2021 15:25   DG Shoulder Left  Result Date: 05/03/2021 CLINICAL DATA:  Unwitnessed fall. EXAM: LEFT SHOULDER - 2+ VIEW COMPARISON:  June 07, 2013. FINDINGS: There is no evidence of fracture or dislocation. There is no evidence of arthropathy or other focal bone abnormality. Soft tissues are unremarkable. IMPRESSION: Negative. Electronically Signed   By: Marijo Conception M.D.   On: 05/03/2021 15:06   DG Knee Complete 4 Views Left  Result Date: 05/03/2021 CLINICAL DATA:  Unwitnessed fall. EXAM: LEFT KNEE - COMPLETE 4+ VIEW COMPARISON:  None. FINDINGS: No evidence of fracture, dislocation, or joint effusion. No evidence of arthropathy or other focal bone abnormality. Soft tissues are unremarkable. IMPRESSION: Negative. Electronically Signed   By: Marijo Conception M.D.   On:  05/03/2021 15:05   ECHOCARDIOGRAM COMPLETE  Result Date: 05/04/2021    ECHOCARDIOGRAM REPORT   Patient Name:   [name] Date of Exam: 05/04/2021 Medical Rec #:  [id number]      Height:       68.0 in Accession #:    [id number]     Weight:       120.0 lb Date of Birth:  1955/04/02      BSA:          1.645 m Patient Age:    55 years       BP:           116/71 mmHg Patient Gender: F              HR:           79 bpm. Exam Location:  Inpatient Procedure: 2D Echo, 3D Echo, Cardiac Doppler and Color Doppler Indications:    R55 Syncope  History:        Patient has prior history of Echocardiogram examinations, most                 recent 12/18/2019. Abnormal ECG and Pacemaker,                 Signs/Symptoms:Syncope and Dyspnea; Risk Factors:Current Smoker.  Sonographer:    Roseanna Rainbow RDCS Referring Phys: [id number] [facility]  Sonographer Comments: Technically difficult study due to poor echo windows. Patient has left shoulder injury, could not turn. IMPRESSIONS  1. Left ventricular ejection fraction, by estimation, is 60 to 65%. Left ventricular ejection fraction by 3D volume is 63 %. The left ventricle has normal function. The left ventricle has no regional wall motion abnormalities. Left ventricular diastolic  parameters are consistent with Grade I diastolic dysfunction (impaired relaxation).  2. Right ventricular systolic function is normal. The right ventricular size is normal. There is normal pulmonary artery systolic pressure. The estimated right ventricular systolic pressure is 86.7 mmHg.  3. The mitral valve is normal in structure. Trivial mitral valve regurgitation.  4. Tricuspid valve regurgitation is moderate.  5. The aortic valve is tricuspid. There is mild thickening of the aortic valve. Aortic valve regurgitation is not visualized. Aortic valve sclerosis is present, with no evidence of aortic valve stenosis.  6. The inferior vena cava is normal in size with greater than 50% respiratory variability,  suggesting right atrial pressure of 3 mmHg. Comparison(s): Compared to prior TTE in 12/2019, there now appears to be moderate TR (previously  reported as trivial). FINDINGS  Left Ventricle: Left ventricular ejection fraction, by estimation, is 60 to 65%. Left ventricular ejection fraction by 3D volume is 63 %. The left ventricle has normal function. The left ventricle has no regional wall motion abnormalities. The left ventricular internal cavity size was normal in size. There is no left ventricular hypertrophy. Left ventricular diastolic parameters are consistent with Grade I diastolic dysfunction (impaired relaxation). Normal left ventricular filling pressure. Right Ventricle: The right ventricular size is normal. No increase in right ventricular wall thickness. Right ventricular systolic function is normal. There is normal pulmonary artery systolic pressure. The tricuspid regurgitant velocity is 2.39 m/s, and  with an assumed right atrial pressure of 3 mmHg, the estimated right ventricular systolic pressure is 26.8 mmHg. Left Atrium: Left atrial size was normal in size. Right Atrium: Right atrial size was normal in size. Pericardium: There is no evidence of pericardial effusion. Mitral Valve: The mitral valve is normal in structure. There is mild thickening of the mitral valve leaflet(s). There is mild calcification of the mitral valve leaflet(s). Trivial mitral valve regurgitation. Tricuspid Valve: The tricuspid valve is normal in structure. Tricuspid valve regurgitation is moderate. Aortic Valve: The aortic valve is tricuspid. There is mild thickening of the aortic valve. Aortic valve regurgitation is not visualized. Aortic valve sclerosis is present, with no evidence of aortic valve stenosis. Pulmonic Valve: The pulmonic valve was normal in structure. Pulmonic valve regurgitation is trivial. Aorta: The aortic root and ascending aorta are structurally normal, with no evidence of dilitation. Venous: The inferior  vena cava is normal in size with greater than 50% respiratory variability, suggesting right atrial pressure of 3 mmHg. IAS/Shunts: The atrial septum is grossly normal. Additional Comments: A device lead is visualized.  LEFT VENTRICLE PLAX 2D LVIDd:         3.90 cm         Diastology LVIDs:         2.55 cm         LV e' medial:    8.05 cm/s LV PW:         0.90 cm         LV E/e' medial:  8.6 LV IVS:        0.90 cm         LV e' lateral:   9.14 cm/s LVOT diam:     1.90 cm         LV E/e' lateral: 7.6 LV SV:         49 LV SV Index:   30 LVOT Area:     2.84 cm        3D Volume EF                                LV 3D EF:    Left                                             ventricul LV Volumes (MOD)                            ar LV vol d, MOD    50.8 ml  ejection A2C:                                        fraction LV vol d, MOD    46.1 ml                    by 3D A4C:                                        volume is LV vol s, MOD    20.5 ml                    63 %. A2C: LV vol s, MOD    18.4 ml A4C:                           3D Volume EF: LV SV MOD A2C:   30.3 ml       3D EF:        63 % LV SV MOD A4C:   46.1 ml       LV EDV:       79 ml LV SV MOD BP:    30.6 ml       LV ESV:       29 ml                                LV SV:        50 ml RIGHT VENTRICLE             IVC RV S prime:     13.10 cm/s  IVC diam: 1.30 cm TAPSE (M-mode): 1.7 cm LEFT ATRIUM             Index        RIGHT ATRIUM           Index LA diam:        2.70 cm 1.64 cm/m   RA Area:     11.00 cm LA Vol (A2C):   16.2 ml 9.85 ml/m   RA Volume:   23.30 ml  14.16 ml/m LA Vol (A4C):   20.5 ml 12.46 ml/m LA Biplane Vol: 18.2 ml 11.06 ml/m  AORTIC VALVE             PULMONIC VALVE LVOT Vmax:   101.00 cm/s PV Vmax:          2.44 m/s LVOT Vmean:  62.300 cm/s PV Peak grad:     23.8 mmHg LVOT VTI:    0.174 m     PR End Diast Vel: 1.83 msec  AORTA Ao Root diam: 3.10 cm Ao Asc diam:  3.00 cm MITRAL VALVE               TRICUSPID VALVE MV Area  (PHT): 3.48 cm    TR Peak grad:   22.8 mmHg MV Decel Time: 218 msec    TR Vmax:        239.00 cm/s MV E velocity: 69.50 cm/s MV A velocity: 85.70 cm/s  SHUNTS MV E/A ratio:  0.81        Systemic VTI:  0.17 m  Systemic Diam: 1.90 cm Gwyndolyn Kaufman MD Electronically signed by Gwyndolyn Kaufman MD Signature Date/Time: 05/04/2021/1:36:16 PM    Final    DG Hip Unilat W or Wo Pelvis 2-3 Views Left  Result Date: 05/03/2021 CLINICAL DATA:  Fall, pain EXAM: DG HIP (WITH OR WITHOUT PELVIS) 2-3V LEFT COMPARISON:  None. FINDINGS: There is no evidence of acute fracture. Mild osteoarthritis of the hips. Partially visualized lumbar spine fusion hardware. IMPRESSION: No evidence of acute left hip fracture. Note that if the patient is unable to bear weight and clinical suspicion for non-displaced hip fracture is significant, CT or MRI would be more sensitive. Electronically Signed   By: Maurine Simmering M.D.   On: 05/03/2021 16:17   CT Maxillofacial Wo Contrast  Result Date: 05/03/2021 CLINICAL DATA:  Head trauma, moderate-severe; Neck trauma, dangerous injury mechanism (Age 68-64y); Facial trauma, blunt EXAM: CT HEAD WITHOUT CONTRAST CT MAXILLOFACIAL WITHOUT CONTRAST CT CERVICAL SPINE WITHOUT CONTRAST TECHNIQUE: Multidetector CT imaging of the head, cervical spine, and maxillofacial structures were performed using the standard protocol without intravenous contrast. Multiplanar CT image reconstructions of the cervical spine and maxillofacial structures were also generated. RADIATION DOSE REDUCTION: This exam was performed according to the departmental dose-optimization program which includes automated exposure control, adjustment of the mA and/or kV according to patient size and/or use of iterative reconstruction technique. COMPARISON:  CT head 03/08/2021. CT cervical spine 02/25/2020. FINDINGS: CT HEAD FINDINGS Brain: No evidence of acute infarction, hemorrhage, hydrocephalus, extra-axial collection  or mass lesion/mass effect. Vascular: Calcific intracranial atherosclerosis. No hyperdense vessel identified. Skull: No acute fracture. Other: No mastoid effusions. CT MAXILLOFACIAL FINDINGS Osseous: No fracture or mandibular dislocation. No destructive process. Orbits: Negative. No traumatic or inflammatory finding. Sinuses: Right frontal sinus mucosal thickening with frothy secretions. Opacified right frontoethmoidal recess. Otherwise, visualized sinuses are largely clear with mild scattered mucosal thickening. Soft tissues: Left forehead/periorbital and left she edema. CT CERVICAL SPINE FINDINGS Alignment: Similar alignment.  No substantial sagittal subluxation. Skull base and vertebrae: Vertebral body heights are similar to prior. No evidence of acute fracture. C4-C6 ACDF with solid bony fusion. Soft tissues and spinal canal: No prevertebral fluid or swelling. No visible canal hematoma. Disc levels:  Similar multilevel degenerative change. Upper chest: Visualized lung apices are clear. IMPRESSION: 1. No evidence of acute intracranial abnormality. 2. Left forehead/periorbital and left cheek edema, nonspecific but presumably contusion in the setting trauma. No acute facial fracture. 3. No evidence of acute fracture or traumatic malalignment in the cervical spine. Solid C4-C6 ACDF. Electronically Signed   By: Margaretha Sheffield M.D.   On: 05/03/2021 15:25    Microbiology: Recent Results (from the past 240 hour(s))  Resp Panel by RT-PCR (Flu A&B, Covid) Nasopharyngeal Swab     Status: None   Collection Time: 05/03/21  2:25 PM   Specimen: Nasopharyngeal Swab; Nasopharyngeal(NP) swabs in vial transport medium  Result Value Ref Range Status   SARS Coronavirus 2 by RT PCR NEGATIVE NEGATIVE Final    Comment: (NOTE) SARS-CoV-2 target nucleic acids are NOT DETECTED.  The SARS-CoV-2 RNA is generally detectable in upper respiratory specimens during the acute phase of infection. The lowest concentration of  SARS-CoV-2 viral copies this assay can detect is 138 copies/mL. A negative result does not preclude SARS-Cov-2 infection and should not be used as the sole basis for treatment or other patient management decisions. A negative result may occur with  improper specimen collection/handling, submission of specimen other than nasopharyngeal swab, presence of viral mutation(s) within the areas  targeted by this assay, and inadequate number of viral copies(<138 copies/mL). A negative result must be combined with clinical observations, patient history, and epidemiological information. The expected result is Negative.  Fact Sheet for Patients:  EntrepreneurPulse.com.au  Fact Sheet for Healthcare Providers:  IncredibleEmployment.be  This test is no t yet approved or cleared by the [place] FDA and  has been authorized for detection and/or diagnosis of SARS-CoV-2 by FDA under an Emergency Use Authorization (EUA). This EUA will remain  in effect (meaning this test can be used) for the duration of the COVID-19 declaration under Section 564(b)(1) of the Act, 21 U.S.C.section 360bbb-3(b)(1), unless the authorization is terminated  or revoked sooner.       Influenza A by PCR NEGATIVE NEGATIVE Final   Influenza B by PCR NEGATIVE NEGATIVE Final    Comment: (NOTE) The Xpert Xpress SARS-CoV-2/FLU/RSV plus assay is intended as an aid in the diagnosis of influenza from Nasopharyngeal swab specimens and should not be used as a sole basis for treatment. Nasal washings and aspirates are unacceptable for Xpert Xpress SARS-CoV-2/FLU/RSV testing.  Fact Sheet for Patients: EntrepreneurPulse.com.au  Fact Sheet for Healthcare Providers: IncredibleEmployment.be  This test is not yet approved or cleared by the [place] FDA and has been authorized for detection and/or diagnosis of SARS-CoV-2 by FDA under an Emergency Use  Authorization (EUA). This EUA will remain in effect (meaning this test can be used) for the duration of the COVID-19 declaration under Section 564(b)(1) of the Act, 21 U.S.C. section 360bbb-3(b)(1), unless the authorization is terminated or revoked.  Performed at [facility], [place] [address], [place], [place] [address]      Labs: Basic Metabolic Panel: Recent Labs  Lab 05/03/21 1949 05/04/21 0445 05/05/21 0436 05/06/21 0054 05/07/21 0622 05/08/21 0108  NA  --  141 137 139 140 141  K  --  3.2* 3.9 3.5 3.2* 4.1  CL  --  118* 112* 109 110 111  CO2  --  16* 21* 22 23 23   GLUCOSE  --  85 95 86 90 90  BUN  --  13 13 8  5* 6*  CREATININE  --  0.56 0.78 0.70 0.64 0.64  CALCIUM  --  6.6* 7.9* 8.1* 7.9* 8.5*  MG 2.0  --   --   --   --   --   PHOS 3.1  --   --   --   --   --    Liver Function Tests: Recent Labs  Lab 05/03/21 1522 05/06/21 0054 05/07/21 0622 05/08/21 0108  AST 105* 71* 51* 40  ALT 26 23 22 25   ALKPHOS 58 36* 35* 36*  BILITOT 1.6* 0.9 1.0 0.5  PROT 6.9 4.6* 4.6* 4.9*  ALBUMIN 3.9 2.5* 2.6* 2.7*   No results for input(s): LIPASE, AMYLASE in the last 168 hours. No results for input(s): AMMONIA in the last 168 hours. CBC: Recent Labs  Lab 05/03/21 1522 05/04/21 0445 05/05/21 0436 05/07/21 0622  WBC 18.5* 14.0* 13.5* 9.1  NEUTROABS 16.0*  --  9.3*  --   HGB 16.7* 12.0 11.7* 11.6*  HCT 52.1* 38.5 35.5* 33.9*  MCV 87.3 90.4 88.1 85.4  PLT 358 229 225 230   Cardiac Enzymes: Recent Labs  Lab 05/04/21 0445 05/05/21 0436 05/06/21 0054 05/07/21 0622 05/08/21 0108  CKTOTAL 4,026* 3,446* 2,602* 1,085* 691*   BNP: BNP (last 3 results) No results for input(s): BNP in the last 8760 hours.  ProBNP (last 3 results) No  results for input(s): PROBNP in the last 8760 hours.  CBG: Recent Labs  Lab 05/03/21 1544 05/06/21 0527 05/07/21 0546 05/08/21 0426  GLUCAP 112* 81 88 91       Signed:  Kayleen Memos, MD [facility] 05/08/2021,  4:22 PM

## 2021-05-08 NOTE — Care Management Important Message (Signed)
Important Message  Patient Details  Name: [name] MRN: [id number] Date of Birth: 08-Nov-1955   Medicare Important Message Given:  Yes     Hannah Beat 05/08/2021, 1:16 PM

## 2021-05-08 NOTE — Progress Notes (Signed)
Reviewed d/c instructions with pt prior d/c, daughter picked up pt, no pain or distress noted. PIV d/c and pressure dressing applied. Noted pt independently ambulating from bathroom to bed.

## 2021-05-08 NOTE — TOC Transition Note (Signed)
Transition of Care [facility]) - CM/SW Discharge Note   Patient Details  Name: [name] MRN: [id number] Date of Birth: Aug 23, 1955  [facility] CM/SW Contact:  Tom-Johnson, Renea Ee, RN Phone Number: 05/08/2021, 1:15 PM   Clinical Narrative:     Patient is scheduled for discharge today. HHPT/OT referral with [facility] and information on AVS. BSC delivered at bedside by [employer]. Denies any other needs. Daughter to transport at discharge. No further TOC needs noted.   Final next level of care: [place] Barriers to Discharge: Barriers Resolved   Patient Goals and CMS Choice Patient states their goals for this hospitalization and ongoing recovery are:: To return home CMS Medicare.gov Compare Post Acute Care list provided to:: Patient Choice offered to / list presented to : Patient  Discharge Placement                Patient to be transferred to facility by: Daughter (Home)      Discharge Plan and Services   Discharge Planning Services: CM Consult Post Acute Care Choice: Home Health          DME Arranged: 3-N-1 DME Agency: [facility] Date DME Agency Contacted: 05/04/21     HH Arranged: PT          Social Determinants of Health (SDOH) Interventions     Readmission Risk Interventions No flowsheet data found.

## 2021-05-09 DIAGNOSIS — E878 Other disorders of electrolyte and fluid balance, not elsewhere classified: Secondary | ICD-10-CM | POA: Diagnosis not present

## 2021-05-09 DIAGNOSIS — Z79891 Long term (current) use of opiate analgesic: Secondary | ICD-10-CM | POA: Diagnosis not present

## 2021-05-09 DIAGNOSIS — I455 Other specified heart block: Secondary | ICD-10-CM | POA: Diagnosis not present

## 2021-05-09 DIAGNOSIS — M6282 Rhabdomyolysis: Secondary | ICD-10-CM | POA: Diagnosis not present

## 2021-05-09 DIAGNOSIS — Z9181 History of falling: Secondary | ICD-10-CM | POA: Diagnosis not present

## 2021-05-09 DIAGNOSIS — R636 Underweight: Secondary | ICD-10-CM | POA: Diagnosis not present

## 2021-05-09 DIAGNOSIS — D72829 Elevated white blood cell count, unspecified: Secondary | ICD-10-CM | POA: Diagnosis not present

## 2021-05-09 DIAGNOSIS — G9341 Metabolic encephalopathy: Secondary | ICD-10-CM | POA: Diagnosis not present

## 2021-05-09 DIAGNOSIS — Z681 Body mass index (BMI) 19 or less, adult: Secondary | ICD-10-CM | POA: Diagnosis not present

## 2021-05-09 DIAGNOSIS — I499 Cardiac arrhythmia, unspecified: Secondary | ICD-10-CM | POA: Diagnosis not present

## 2021-05-09 DIAGNOSIS — Z950 Presence of cardiac pacemaker: Secondary | ICD-10-CM | POA: Diagnosis not present

## 2021-05-09 DIAGNOSIS — M5136 Other intervertebral disc degeneration, lumbar region: Secondary | ICD-10-CM | POA: Diagnosis not present

## 2021-05-09 DIAGNOSIS — G8929 Other chronic pain: Secondary | ICD-10-CM | POA: Diagnosis not present

## 2021-05-09 DIAGNOSIS — F17210 Nicotine dependence, cigarettes, uncomplicated: Secondary | ICD-10-CM | POA: Diagnosis not present

## 2021-05-11 DIAGNOSIS — N3090 Cystitis, unspecified without hematuria: Secondary | ICD-10-CM | POA: Diagnosis not present

## 2021-05-12 NOTE — Progress Notes (Signed)
Remote pacemaker transmission.   

## 2021-05-16 ENCOUNTER — Encounter (HOSPITAL_COMMUNITY): Payer: Self-pay | Admitting: Emergency Medicine

## 2021-05-16 ENCOUNTER — Emergency Department (HOSPITAL_COMMUNITY): Payer: Medicare Other

## 2021-05-16 ENCOUNTER — Other Ambulatory Visit: Payer: Self-pay

## 2021-05-16 ENCOUNTER — Emergency Department (HOSPITAL_COMMUNITY)
Admission: EM | Admit: 2021-05-16 | Discharge: 2021-05-16 | Disposition: A | Discharge status: Home / Self Care | Payer: Medicare Other | Attending: Emergency Medicine | Admitting: Emergency Medicine

## 2021-05-16 DIAGNOSIS — Z743 Need for continuous supervision: Secondary | ICD-10-CM | POA: Diagnosis not present

## 2021-05-16 DIAGNOSIS — R627 Adult failure to thrive: Secondary | ICD-10-CM | POA: Diagnosis not present

## 2021-05-16 DIAGNOSIS — R531 Weakness: Secondary | ICD-10-CM | POA: Insufficient documentation

## 2021-05-16 DIAGNOSIS — R5381 Other malaise: Secondary | ICD-10-CM | POA: Diagnosis not present

## 2021-05-16 DIAGNOSIS — R748 Abnormal levels of other serum enzymes: Secondary | ICD-10-CM | POA: Diagnosis not present

## 2021-05-16 DIAGNOSIS — Z79899 Other long term (current) drug therapy: Secondary | ICD-10-CM | POA: Diagnosis not present

## 2021-05-16 DIAGNOSIS — R0602 Shortness of breath: Secondary | ICD-10-CM | POA: Diagnosis not present

## 2021-05-16 DIAGNOSIS — R0689 Other abnormalities of breathing: Secondary | ICD-10-CM | POA: Diagnosis not present

## 2021-05-16 LAB — CBC WITH DIFFERENTIAL/PLATELET
Abs Immature Granulocytes: 0.04 10*3/uL (ref 0.00–0.07)
Basophils Absolute: 0.1 10*3/uL (ref 0.0–0.1)
Basophils Relative: 1 %
Eosinophils Absolute: 0.1 10*3/uL (ref 0.0–0.5)
Eosinophils Relative: 1 %
HCT: 40.2 % (ref 36.0–46.0)
Hemoglobin: 12.9 g/dL (ref 12.0–15.0)
Immature Granulocytes: 0 %
Lymphocytes Relative: 20 %
Lymphs Abs: 2 10*3/uL (ref 0.7–4.0)
MCH: 28.4 pg (ref 26.0–34.0)
MCHC: 32.1 g/dL (ref 30.0–36.0)
MCV: 88.5 fL (ref 80.0–100.0)
Monocytes Absolute: 0.8 10*3/uL (ref 0.1–1.0)
Monocytes Relative: 8 %
Neutro Abs: 7.4 10*3/uL (ref 1.7–7.7)
Neutrophils Relative %: 70 %
Platelets: 318 10*3/uL (ref 150–400)
RBC: 4.54 MIL/uL (ref 3.87–5.11)
RDW: 17.2 % — ABNORMAL HIGH (ref 11.5–15.5)
WBC: 10.5 10*3/uL (ref 4.0–10.5)
nRBC: 0 % (ref 0.0–0.2)

## 2021-05-16 LAB — COMPREHENSIVE METABOLIC PANEL
ALT: 15 U/L (ref 0–44)
AST: 20 U/L (ref 15–41)
Albumin: 3.7 g/dL (ref 3.5–5.0)
Alkaline Phosphatase: 45 U/L (ref 38–126)
Anion gap: 11 (ref 5–15)
BUN: 11 mg/dL (ref 8–23)
CO2: 24 mmol/L (ref 22–32)
Calcium: 8.9 mg/dL (ref 8.9–10.3)
Chloride: 105 mmol/L (ref 98–111)
Creatinine, Ser: 0.66 mg/dL (ref 0.44–1.00)
GFR, Estimated: >60 mL/min (ref >60)
Glucose, Bld: 109 mg/dL — ABNORMAL HIGH (ref 70–99)
Potassium: 3.9 mmol/L (ref 3.5–5.1)
Sodium: 140 mmol/L (ref 135–145)
Total Bilirubin: 1 mg/dL (ref 0.3–1.2)
Total Protein: 6.5 g/dL (ref 6.5–8.1)

## 2021-05-16 LAB — URINALYSIS, ROUTINE W REFLEX MICROSCOPIC
Bacteria, UA: NONE SEEN
Bilirubin Urine: NEGATIVE
Glucose, UA: NEGATIVE mg/dL
Hgb urine dipstick: NEGATIVE
Ketones, ur: 5 mg/dL — AB
Leukocytes,Ua: NEGATIVE
Nitrite: NEGATIVE
Protein, ur: 30 mg/dL — AB
Specific Gravity, Urine: 1.021 (ref 1.005–1.030)
pH: 5 (ref 5.0–8.0)

## 2021-05-16 LAB — CBG MONITORING, ED: Glucose-Capillary: 112 mg/dL — ABNORMAL HIGH (ref 70–99)

## 2021-05-16 LAB — LIPASE, BLOOD: Lipase: 25 U/L (ref 11–51)

## 2021-05-16 MED ORDER — SODIUM CHLORIDE 0.9 % IV BOLUS
1000.0000 mL | Freq: Once | INTRAVENOUS | Status: AC
Start: 2021-05-16 — End: 2021-05-16
  Administered 2021-05-16: 1000 mL via INTRAVENOUS

## 2021-05-16 MED ORDER — SODIUM CHLORIDE 0.9 % IV BOLUS
1000.0000 mL | Freq: Once | INTRAVENOUS | Status: AC
  Administered 2021-05-16: 1000 mL via INTRAVENOUS

## 2021-05-16 NOTE — Discharge Instructions (Signed)
No obvious reason for inpatient rehab on your workup today.  Please call your family doctor and work with home health if this is a continued concern and see what they can do to help you.

## 2021-05-16 NOTE — ED Notes (Signed)
Pt states she lives with her daughter, [name]

## 2021-05-16 NOTE — ED Notes (Signed)
Attempted to call pt's daughter [name] [phone] to pick pt up, no answer

## 2021-05-16 NOTE — ED Notes (Addendum)
Pt walked to the bathroom with walker with no complaints. RN and Doctor notified.

## 2021-05-16 NOTE — ED Triage Notes (Signed)
Arrived via EMS from home. Pt's family told EMS that they wanted her to be in rehab and that they were told by their physician that they needed to go to the ED for placement. Pt has been experiencing weakness for the past 4 days. Pt has not been eating and drinking for the past 4 days.

## 2021-05-16 NOTE — ED Provider Notes (Addendum)
[facility] DEPT Provider Note   CSN: [id number] Arrival date & time: 05/16/21  [place]     History  Chief Complaint  Patient presents with   Weakness    [name] is a 66 y.o. female.  66 yo F with a chief complaints of failure to thrive.  The patient tells me that she does not really feel like eating and drinking and this has been going on for about a week now.  She was just in the hospital and had been discharged back home.  I discussed the case with EMS and the family was concerned that she may be needed rehab placements and when they had called their family doctor was told that they should send her here for evaluation.  She feels short of breath at times.  She denies cough congestion or fever denies abdominal pain denies nausea vomiting or diarrhea.  Denies urinary symptoms.   Weakness     Home Medications Prior to Admission medications   Medication Sig Start Date End Date Taking? Authorizing Provider  acetaminophen (TYLENOL) 325 MG tablet Take 2 tablets (650 mg total) by mouth every 6 (six) hours as needed for mild pain (or Fever >/= 101). 02/26/20   Domenic Polite, MD  ALPRAZolam Duanne Moron) 1 MG tablet Take 0.5 tablets (0.5 mg total) by mouth 3 (three) times daily as needed for anxiety. 03/12/21   Raiford Noble Latif, DO  [facility] B-12 500 MCG tablet Take 500 mcg by mouth daily. 02/13/21   [provider]  hydrOXYzine (ATARAX) 50 MG tablet TAKE 1-2 TABLETS (50-100 MG TOTAL) BY MOUTH AT BEDTIME AS NEEDED. Patient taking differently: Take 50-100 mg by mouth at bedtime as needed for anxiety. 03/27/21   Addison Lank, PA-C  Multiple Vitamin (MULTIVITAMIN WITH MINERALS) TABS tablet Take 1 tablet by mouth daily. 05/08/21 08/06/21  Kayleen Memos, DO  Oxycodone HCl 10 MG TABS Take 0.5 tablets (5 mg total) by mouth 3 (three) times daily as needed (pain). 03/12/21   Raiford Noble Latif, DO  risperiDONE (RISPERDAL) 3 MG tablet Take 1 tablet (3 mg total)  by mouth at bedtime. 01/26/21   Addison Lank, PA-C      Allergies    Antibiotic ear [neomycin-polymyxin-hc] and Penicillins    Review of Systems   Review of Systems  Neurological:  Positive for weakness.   Physical Exam Updated Vital Signs BP 126/75    Pulse 78    Temp 98.4 F (36.9 C) (Oral)    Resp 15    Ht 5\' 7"  (1.702 m)    Wt 47.2 kg    SpO2 98%    BMI 16.29 kg/m  Physical Exam Vitals and nursing note reviewed.  Constitutional:      General: She is not in acute distress.    Appearance: She is well-developed. She is not diaphoretic.     Comments: Cachectic  HENT:     Head: Normocephalic and atraumatic.  Eyes:     Pupils: Pupils are equal, round, and reactive to light.  Cardiovascular:     Rate and Rhythm: Normal rate and regular rhythm.     Heart sounds: No murmur heard.   No friction rub. No gallop.  Pulmonary:     Effort: Pulmonary effort is normal.     Breath sounds: No wheezing or rales.  Abdominal:     General: There is no distension.     Palpations: Abdomen is soft.     Tenderness: There is  no abdominal tenderness.  Musculoskeletal:        General: No tenderness.     Cervical back: Normal range of motion and neck supple.  Skin:    General: Skin is warm and dry.  Neurological:     Mental Status: She is alert and oriented to person, place, and time.  Psychiatric:        Behavior: Behavior normal.     Comments: Flat affect    ED Results / Procedures / Treatments   Labs (all labs ordered are listed, but only abnormal results are displayed) Labs Reviewed  CBC WITH DIFFERENTIAL/PLATELET - Abnormal; Notable for the following components:      Result Value   RDW 17.2 (*)    All other components within normal limits  COMPREHENSIVE METABOLIC PANEL - Abnormal; Notable for the following components:   Glucose, Bld 109 (*)    All other components within normal limits  URINALYSIS, ROUTINE W REFLEX MICROSCOPIC - Abnormal; Notable for the following components:    Ketones, ur 5 (*)    Protein, ur 30 (*)    All other components within normal limits  CBG MONITORING, ED - Abnormal; Notable for the following components:   Glucose-Capillary 112 (*)    All other components within normal limits  LIPASE, BLOOD    EKG None  Radiology DG Chest Port 1 View  Result Date: 05/16/2021 CLINICAL DATA:  Shortness of breath EXAM: PORTABLE CHEST 1 VIEW COMPARISON:  12/31/2021 FINDINGS: Left pacer remains in place, unchanged. Heart and mediastinal contours are within normal limits. No focal opacities or effusions. No acute bony abnormality. IMPRESSION: No active cardiopulmonary disease. Electronically Signed   By: Rolm Baptise M.D.   On: 05/16/2021 20:01    Procedures Procedures    Medications Ordered in ED Medications  sodium chloride 0.9 % bolus 1,000 mL (0 mLs Intravenous Stopped 05/16/21 2033)  sodium chloride 0.9 % bolus 1,000 mL (1,000 mLs Intravenous New Bag/Given 05/16/21 2049)    ED Course/ Medical Decision Making/ A&P                           Medical Decision Making Amount and/or Complexity of Data Reviewed Labs: ordered. Radiology: ordered.   66 yo F with a chief complaints of failure to thrive.  The patient tells me that she does not want to eat and drink.  She is not exactly able to tell me why.  She was just in the hospital and on record review it does appear that she was seen by PT and OT and they recommended home health, they did note that she was not having significant intake while in the hospital.  Patient without any obvious acute change.  We will obtain a laboratory evaluation chest x-ray and UA to screen for infection.  Reassess.  No anemia no leukocytosis, no significant electrolyte abnormality no significant change to renal function.  LFTs and lipase are unremarkable.  Chest x-ray independently interpreted by me without focal infiltrate or pneumothorax.  UA without signs of infection.  Patient feeling mildly better after bolus of  IV fluids.  Will attempt to ambulate.  On record review the patient was able to ambulate independently prior to discharge.  She was able to ambulate here without issue. D/c home.   I put the patient up for discharge and unfortunately we had trouble getting in touch with the patient's family to come and pick her up.  After a few hours  we were able to get in touch with the daughter who was a bit upset that the patient was being discharged.  She told me that she has been having some trouble taking care of her at home and felt like she has not really been eating and drinking and she was unfortunately needed help a lot getting the patient around at home.  I discussed with her at length about her results here and now she was able to ambulate without issue and based on my record review I do not see any concern by PT or OT that the patient would require skilled nursing facility or rehab facility.  The daughter was somewhat frustrated with the process but did agree to come to the ED to pick up her mom.  I encouraged her to keep in touch with the family doctor and have her reevaluated in the office.  9:30 PM:  I have discussed the diagnosis/risks/treatment options with the patient.  Evaluation and diagnostic testing in the emergency department does not suggest an emergent condition requiring admission or immediate intervention beyond what has been performed at this time.  They will follow up with  PCP. We also discussed returning to the ED immediately if new or worsening sx occur. We discussed the sx which are most concerning (e.g., sudden worsening pain, fever, inability to tolerate by mouth) that necessitate immediate return. Medications administered to the patient during their visit and any new prescriptions provided to the patient are listed below.  Medications given during this visit Medications  sodium chloride 0.9 % bolus 1,000 mL (0 mLs Intravenous Stopped 05/16/21 2033)  sodium chloride 0.9 % bolus 1,000  mL (1,000 mLs Intravenous New Bag/Given 05/16/21 2049)     The patient appears reasonably screen and/or stabilized for discharge and I doubt any other medical condition or other [facility] requiring further screening, evaluation, or treatment in the ED at this time prior to discharge.          Final Clinical Impression(s) / ED Diagnoses Final diagnoses:  Failure to thrive in adult    Rx / DC Orders ED Discharge Orders     None         Deno Etienne, DO 05/16/21 2130    Deno Etienne, DO 05/16/21 2304

## 2021-05-17 DIAGNOSIS — I499 Cardiac arrhythmia, unspecified: Secondary | ICD-10-CM | POA: Diagnosis not present

## 2021-05-17 DIAGNOSIS — G8929 Other chronic pain: Secondary | ICD-10-CM | POA: Diagnosis not present

## 2021-05-17 DIAGNOSIS — I455 Other specified heart block: Secondary | ICD-10-CM | POA: Diagnosis not present

## 2021-05-17 DIAGNOSIS — M6282 Rhabdomyolysis: Secondary | ICD-10-CM | POA: Diagnosis not present

## 2021-05-19 DIAGNOSIS — I499 Cardiac arrhythmia, unspecified: Secondary | ICD-10-CM | POA: Diagnosis not present

## 2021-05-19 DIAGNOSIS — I455 Other specified heart block: Secondary | ICD-10-CM | POA: Diagnosis not present

## 2021-05-19 DIAGNOSIS — G8929 Other chronic pain: Secondary | ICD-10-CM | POA: Diagnosis not present

## 2021-05-19 DIAGNOSIS — M6282 Rhabdomyolysis: Secondary | ICD-10-CM | POA: Diagnosis not present

## 2021-05-23 DIAGNOSIS — I499 Cardiac arrhythmia, unspecified: Secondary | ICD-10-CM | POA: Diagnosis not present

## 2021-05-23 DIAGNOSIS — I455 Other specified heart block: Secondary | ICD-10-CM | POA: Diagnosis not present

## 2021-05-23 DIAGNOSIS — M6282 Rhabdomyolysis: Secondary | ICD-10-CM | POA: Diagnosis not present

## 2021-05-23 DIAGNOSIS — G8929 Other chronic pain: Secondary | ICD-10-CM | POA: Diagnosis not present

## 2021-05-25 DIAGNOSIS — M6282 Rhabdomyolysis: Secondary | ICD-10-CM | POA: Diagnosis not present

## 2021-05-25 DIAGNOSIS — I455 Other specified heart block: Secondary | ICD-10-CM | POA: Diagnosis not present

## 2021-05-25 DIAGNOSIS — I499 Cardiac arrhythmia, unspecified: Secondary | ICD-10-CM | POA: Diagnosis not present

## 2021-05-25 DIAGNOSIS — G8929 Other chronic pain: Secondary | ICD-10-CM | POA: Diagnosis not present

## 2021-05-26 DIAGNOSIS — I455 Other specified heart block: Secondary | ICD-10-CM | POA: Diagnosis not present

## 2021-05-26 DIAGNOSIS — G8929 Other chronic pain: Secondary | ICD-10-CM | POA: Diagnosis not present

## 2021-05-26 DIAGNOSIS — I499 Cardiac arrhythmia, unspecified: Secondary | ICD-10-CM | POA: Diagnosis not present

## 2021-05-26 DIAGNOSIS — M6282 Rhabdomyolysis: Secondary | ICD-10-CM | POA: Diagnosis not present

## 2021-05-30 ENCOUNTER — Ambulatory Visit (INDEPENDENT_AMBULATORY_CARE_PROVIDER_SITE_OTHER): Payer: Medicare Other | Admitting: Physician Assistant

## 2021-05-30 ENCOUNTER — Encounter: Payer: Self-pay | Admitting: Physician Assistant

## 2021-05-30 DIAGNOSIS — G4700 Insomnia, unspecified: Secondary | ICD-10-CM

## 2021-05-30 DIAGNOSIS — F411 Generalized anxiety disorder: Secondary | ICD-10-CM

## 2021-05-30 DIAGNOSIS — F39 Unspecified mood [affective] disorder: Secondary | ICD-10-CM

## 2021-05-30 DIAGNOSIS — F17200 Nicotine dependence, unspecified, uncomplicated: Secondary | ICD-10-CM

## 2021-05-30 DIAGNOSIS — G4709 Other insomnia: Secondary | ICD-10-CM

## 2021-05-30 MED ORDER — ALPRAZOLAM 1 MG PO TABS
0.5000 mg | ORAL_TABLET | Freq: Three times a day (TID) | ORAL | 3 refills | Status: DC | PRN
Start: 2021-05-30 — End: 2021-10-03

## 2021-05-30 MED ORDER — RISPERIDONE 3 MG PO TABS: 3.0000 mg | ORAL_TABLET | Freq: Every day | ORAL | 3 refills | Status: DC

## 2021-05-30 NOTE — Progress Notes (Signed)
[facility] Med Check ? ?Patient ID: [name],  ?MRN: [id number] ? ?PCP: Lujean Amel, MD ? ?Date of Evaluation: 05/30/2021 ?Time spent:30 minutes ? ?Chief Complaint:  ?Chief Complaint   ?Anxiety; Depression; Insomnia; Follow-up ?  ? ? ?Virtual Visit via Telehealth ? ?I connected with patient by telephone, with their informed consent, and verified patient privacy and that I am speaking with the correct person using two identifiers.  I am private, in my office and the patient is at home.  She is unable to do a video visit at this time. ? ?I discussed the limitations, risks, security and privacy concerns of performing an evaluation and management service by telephone and the availability of in person appointments. I also discussed with the patient that there may be a patient responsible charge related to this service. The patient expressed understanding and agreed to proceed. ?  ?I discussed the assessment and treatment plan with the patient. The patient was provided an opportunity to ask questions and all were answered. The patient agreed with the plan and demonstrated an understanding of the instructions. ?  ?The patient was advised to call back or seek an in-person evaluation if the symptoms worsen or if the condition fails to improve as anticipated. ? ?I provided 30 minutes of non-face-to-face time during this encounter. ? ? ?HISTORY/CURRENT STATUS: ?HPI for routine med check. ? ?States that her mental health medications are working well.  She has been weak since getting out of the hospital a few weeks ago.  States she was admitted after being found on the floor for 2 days.  She does not know how she got there or why.  Not sure if she fainted, fell, or what.  Reports no falls that she can remember.  No imbalance or confusion. ? ?Anxiety is the same.  If she does not take the Xanax she will get very jittery and feel like she is about to have a panic attack.  Taking the Xanax 3 times a day helps that.   States she is sleeping well. ? ?She does not do much of anything but that has not changed.  She does not cry easily.  ADLs are fair but because of her physical problems not her mental health.  Personal hygiene is normal.  States she is eating and drinking better than she had been.  No suicidal or homicidal thoughts. ? ?No symptoms of mania such as increased energy, talkativeness, spending, grandiosity, impulsivity, no paranoia, no psychosis. ? ?Review of Systems  ?Constitutional:  Positive for malaise/fatigue.  ?HENT: Negative.    ?Eyes: Negative.   ?Respiratory: Negative.    ?Cardiovascular: Negative.   ?Gastrointestinal: Negative.   ?Genitourinary: Negative.   ?Musculoskeletal: Negative.   ?Skin: Negative.   ?Neurological: Negative.   ?Endo/Heme/Allergies: Negative.   ?Psychiatric/Behavioral:    ?     See HPI.  ? ?Individual Medical History/ Review of Systems: Changes? :Yes   hospitalized in the past month.  See records on chart. ? ?Past medications for mental health diagnoses include: ?Trazodone, Belsomra, Risperdal, Zoloft, hydroxyzine, mirtazapine, Xanax, Valium, Wellbutrin, Abilify, gabapentin, Strattera, Vyvanse, Ambien, Lunesta, Dayvigo didn't help, Latuda ? ?Allergies: Antibiotic ear [neomycin-polymyxin-hc] and Penicillins ? ?Current Medications:  ?Current Outpatient Medications:  ?  acetaminophen (TYLENOL) 325 MG tablet, Take 2 tablets (650 mg total) by mouth every 6 (six) hours as needed for mild pain (or Fever >/= 101)., Disp: , Rfl:  ?  [facility] B-12 500 MCG tablet, Take 500 mcg by mouth daily., Disp: ,  Rfl:  ?  hydrOXYzine (ATARAX) 50 MG tablet, TAKE 1-2 TABLETS (50-100 MG TOTAL) BY MOUTH AT BEDTIME AS NEEDED. (Patient taking differently: Take 50-100 mg by mouth at bedtime as needed for anxiety.), Disp: 180 tablet, Rfl: 2 ?  ALPRAZolam (XANAX) 1 MG tablet, Take 0.5 tablets (0.5 mg total) by mouth 3 (three) times daily as needed for anxiety., Disp: 90 tablet, Rfl: 3 ?  Multiple Vitamin (MULTIVITAMIN WITH  MINERALS) TABS tablet, Take 1 tablet by mouth daily. (Patient not taking: Reported on 05/30/2021), Disp: 90 tablet, Rfl: 0 ?  Oxycodone HCl 10 MG TABS, Take 0.5 tablets (5 mg total) by mouth 3 (three) times daily as needed (pain). (Patient not taking: Reported on 05/30/2021), Disp: , Rfl: 0 ?  risperiDONE (RISPERDAL) 3 MG tablet, Take 1 tablet (3 mg total) by mouth at bedtime., Disp: 30 tablet, Rfl: 3 ?Medication Side Effects: none ? ?Family Medical/ Social History: Changes? No ? ?MENTAL HEALTH EXAM: ? ?There were no vitals taken for this visit.There is no height or weight on file to calculate BMI.  ?General Appearance:  Unable to assess  ?Eye Contact:   Unable to assess  ?Speech:  Clear and Coherent and Normal Rate  ?Volume:  Decreased  ?Mood:  Euthymic  ?Affect:   Unable to assess  ?Thought Process:  Goal Directed and Descriptions of Associations: Circumstantial  ?Orientation:  Full (Time, Place, and Person)  ?Thought Content: Logical   ?Suicidal Thoughts:  No  ?Homicidal Thoughts:  No  ?Memory:   Fair  ?Judgement:  Fair  ?Insight:  Fair  ?Psychomotor Activity:   Unable to assess  ?Concentration:  Concentration: Good  ?Recall:  Fair  ?Fund of Knowledge: Fair  ?Language: Fair  ?Assets:  Desire for Improvement  ?ADL's:  Intact  ?Cognition: WNL  ?Prognosis:  Fair  ? ?Reviewed hospitalization records with discharge 05/03/2021.  Diagnosed with rhabdomyolysis, acute metabolic encephalopathy due to dehydration and electrolyte abnormality. ?ER visit on 05/16/2021 for failure to thrive.  Was not admitted.  See lab review on the chart. ? ?DIAGNOSES:  ?  ICD-10-CM   ?1. Generalized anxiety disorder  F41.1 risperiDONE (RISPERDAL) 3 MG tablet  ?  ?2. Episodic mood disorder ([place])  F39   ?  ?3. Insomnia, unspecified type  G47.00 risperiDONE (RISPERDAL) 3 MG tablet  ?  ?4. Smoker  F17.200   ?  ?5. Other insomnia  G47.09 risperiDONE (RISPERDAL) 3 MG tablet  ?  ? ? ?Receiving Psychotherapy: No  ? ? ?RECOMMENDATIONS:  ?PDMP was  reviewed. Last Xanax filled 04/28/2021.  Last oxycodone filled 05/21/2021 but patient told me she is no longer taking it. ?I provided 30 minutes of non-face-to-face time during this encounter, including time spent before and after the visit in records review, medical decision making, counseling pertinent to today's visit, and charting.  ?She is stable on current medications so no changes are necessary. ?Smoking cessation discussed.  States she is not ready to quit. ?I reminded her of the increased risk of confusion, imbalance, memory problems, and falls due to any benzodiazepine.  Then the falls can then lead to broken bones, needing hip replacement or the like.  I recommend decreasing the dose of Xanax with the intent of stopping it altogether but she has been on it so long and it is helpful so she will not stop it.  She is fully aware of these risks and accepts them.  At this point I think the benefit of the Xanax outweighs the risk of confusion,  imbalance, memory issues, and falls.  She has been on the Xanax for years so if/when she is agreeable to wean, it would have to be a very slow taper. ? ?Continue Xanax 1 mg, 1 p.o. 3 times daily as needed, take as little as possible. ?Continue hydroxyzine 50 mg, 1-2 p.o. nightly as needed sleep. ?Continue Risperdal 3 mg, 1 p.o. nightly. ?Continue multivitamin, B12, recommend vitamin D and fish oil. ?Return in 4 months. ? ?Donnal Moat, PA-C  ?

## 2021-05-31 DIAGNOSIS — I455 Other specified heart block: Secondary | ICD-10-CM | POA: Diagnosis not present

## 2021-05-31 DIAGNOSIS — G8929 Other chronic pain: Secondary | ICD-10-CM | POA: Diagnosis not present

## 2021-05-31 DIAGNOSIS — I499 Cardiac arrhythmia, unspecified: Secondary | ICD-10-CM | POA: Diagnosis not present

## 2021-05-31 DIAGNOSIS — M6282 Rhabdomyolysis: Secondary | ICD-10-CM | POA: Diagnosis not present

## 2021-06-01 DIAGNOSIS — M6282 Rhabdomyolysis: Secondary | ICD-10-CM | POA: Diagnosis not present

## 2021-06-01 DIAGNOSIS — I499 Cardiac arrhythmia, unspecified: Secondary | ICD-10-CM | POA: Diagnosis not present

## 2021-06-01 DIAGNOSIS — I455 Other specified heart block: Secondary | ICD-10-CM | POA: Diagnosis not present

## 2021-06-01 DIAGNOSIS — G8929 Other chronic pain: Secondary | ICD-10-CM | POA: Diagnosis not present

## 2021-06-02 DIAGNOSIS — M6282 Rhabdomyolysis: Secondary | ICD-10-CM | POA: Diagnosis not present

## 2021-06-02 DIAGNOSIS — I455 Other specified heart block: Secondary | ICD-10-CM | POA: Diagnosis not present

## 2021-06-02 DIAGNOSIS — I499 Cardiac arrhythmia, unspecified: Secondary | ICD-10-CM | POA: Diagnosis not present

## 2021-06-02 DIAGNOSIS — G8929 Other chronic pain: Secondary | ICD-10-CM | POA: Diagnosis not present

## 2021-06-06 DIAGNOSIS — M6282 Rhabdomyolysis: Secondary | ICD-10-CM | POA: Diagnosis not present

## 2021-06-06 DIAGNOSIS — I499 Cardiac arrhythmia, unspecified: Secondary | ICD-10-CM | POA: Diagnosis not present

## 2021-06-06 DIAGNOSIS — G8929 Other chronic pain: Secondary | ICD-10-CM | POA: Diagnosis not present

## 2021-06-06 DIAGNOSIS — I455 Other specified heart block: Secondary | ICD-10-CM | POA: Diagnosis not present

## 2021-06-08 DIAGNOSIS — I455 Other specified heart block: Secondary | ICD-10-CM | POA: Diagnosis not present

## 2021-06-08 DIAGNOSIS — G8929 Other chronic pain: Secondary | ICD-10-CM | POA: Diagnosis not present

## 2021-06-08 DIAGNOSIS — I499 Cardiac arrhythmia, unspecified: Secondary | ICD-10-CM | POA: Diagnosis not present

## 2021-06-08 DIAGNOSIS — M6282 Rhabdomyolysis: Secondary | ICD-10-CM | POA: Diagnosis not present

## 2021-06-14 DIAGNOSIS — G8929 Other chronic pain: Secondary | ICD-10-CM | POA: Diagnosis not present

## 2021-06-14 DIAGNOSIS — M6282 Rhabdomyolysis: Secondary | ICD-10-CM | POA: Diagnosis not present

## 2021-06-14 DIAGNOSIS — I455 Other specified heart block: Secondary | ICD-10-CM | POA: Diagnosis not present

## 2021-06-14 DIAGNOSIS — I499 Cardiac arrhythmia, unspecified: Secondary | ICD-10-CM | POA: Diagnosis not present

## 2021-06-21 DIAGNOSIS — G8929 Other chronic pain: Secondary | ICD-10-CM | POA: Diagnosis not present

## 2021-06-21 DIAGNOSIS — M6282 Rhabdomyolysis: Secondary | ICD-10-CM | POA: Diagnosis not present

## 2021-06-21 DIAGNOSIS — I455 Other specified heart block: Secondary | ICD-10-CM | POA: Diagnosis not present

## 2021-06-21 DIAGNOSIS — I499 Cardiac arrhythmia, unspecified: Secondary | ICD-10-CM | POA: Diagnosis not present

## 2021-06-22 DIAGNOSIS — M6282 Rhabdomyolysis: Secondary | ICD-10-CM | POA: Diagnosis not present

## 2021-06-22 DIAGNOSIS — I455 Other specified heart block: Secondary | ICD-10-CM | POA: Diagnosis not present

## 2021-06-22 DIAGNOSIS — I499 Cardiac arrhythmia, unspecified: Secondary | ICD-10-CM | POA: Diagnosis not present

## 2021-06-22 DIAGNOSIS — G8929 Other chronic pain: Secondary | ICD-10-CM | POA: Diagnosis not present

## 2021-06-30 DIAGNOSIS — I455 Other specified heart block: Secondary | ICD-10-CM | POA: Diagnosis not present

## 2021-06-30 DIAGNOSIS — M6282 Rhabdomyolysis: Secondary | ICD-10-CM | POA: Diagnosis not present

## 2021-06-30 DIAGNOSIS — G8929 Other chronic pain: Secondary | ICD-10-CM | POA: Diagnosis not present

## 2021-06-30 DIAGNOSIS — I499 Cardiac arrhythmia, unspecified: Secondary | ICD-10-CM | POA: Diagnosis not present

## 2021-07-03 DIAGNOSIS — G8929 Other chronic pain: Secondary | ICD-10-CM | POA: Diagnosis not present

## 2021-07-03 DIAGNOSIS — I455 Other specified heart block: Secondary | ICD-10-CM | POA: Diagnosis not present

## 2021-07-03 DIAGNOSIS — M6282 Rhabdomyolysis: Secondary | ICD-10-CM | POA: Diagnosis not present

## 2021-07-03 DIAGNOSIS — I499 Cardiac arrhythmia, unspecified: Secondary | ICD-10-CM | POA: Diagnosis not present

## 2021-07-25 ENCOUNTER — Other Ambulatory Visit: Payer: Self-pay | Admitting: Physician Assistant

## 2021-07-25 ENCOUNTER — Telehealth: Payer: Self-pay | Admitting: Physician Assistant

## 2021-07-25 MED ORDER — TASIMELTEON 20 MG PO CAPS: 20.0000 mg | ORAL_CAPSULE | Freq: Every day | ORAL | 1 refills | Status: DC

## 2021-07-25 NOTE — Telephone Encounter (Signed)
See message from patient. She said she has had trouble with sleep for years. She said she had trouble initiating sleep, but usually sleeps thru the night once she gets to sleep. She estimates she gets 6 hours of sleep. She has one can of caffeine earlier in the day. She said she can't remember medications, but has taken prescription and OTC meds in the past to help with sleep but they stopped working. She said she has a regular bedtime routine, but watches TV when she can't sleep. I told her that she shouldn't be taking someone else's medications. She said she just took it once to see if it would work.  ? ?[facility] [address] ? ?

## 2021-07-25 NOTE — Telephone Encounter (Signed)
Pt called at 4:00 pm and said that her sleeping meds are not working. Her daughter gave hber  her sleeping pill which is dayvigo and it worked. Please call her and let her know what teresa wants to do. Phone number is [phone] ?

## 2021-07-25 NOTE — Telephone Encounter (Signed)
She was prescribed Dayvigo in the past and it did not work then. I sent in a prescription for Hetlioz.

## 2021-07-26 NOTE — Telephone Encounter (Signed)
Patient returned call and the information was provided.  ?

## 2021-07-26 NOTE — Telephone Encounter (Signed)
LVM to RC 

## 2021-07-27 NOTE — Telephone Encounter (Signed)
[name] called today at 1:44pm to say that she doesn't think the prescription you called in won't work because she already takes over the counter melatonin.  She said she tried the [facility] and it does work so would like it called in to [facility] on [address]. ?

## 2021-07-27 NOTE — Telephone Encounter (Signed)
Please see message. I know you said she had issues with Dayvigo in the past and that was relayed to the patient yesterday.  ?

## 2021-07-28 ENCOUNTER — Other Ambulatory Visit: Payer: Self-pay | Admitting: Physician Assistant

## 2021-07-28 MED ORDER — DAYVIGO 10 MG PO TABS: 10.0000 mg | ORAL_TABLET | Freq: Every evening | ORAL | 0 refills | Status: DC

## 2021-07-28 NOTE — Telephone Encounter (Signed)
I'll send in 7 pills of the Dayvigo, to retry. I'm not prescribing more until we know for sure it works. She doesn't know the Cave won't work b/c she my knowledge she'd never tried it before. It's not OTC. We will still try to get PA (if needed) for that.

## 2021-07-28 NOTE — Telephone Encounter (Signed)
Patient notified

## 2021-07-30 ENCOUNTER — Telehealth: Payer: Self-pay

## 2021-07-30 NOTE — Telephone Encounter (Signed)
Prior Authorization initiated ?Tasimelteon '20MG'$  capsules ?[employer] Medicare Part D ? ? ?

## 2021-08-01 NOTE — Telephone Encounter (Signed)
No update needed ?

## 2021-08-03 NOTE — Telephone Encounter (Signed)
Pt's prior authorization was DENIED for Tasimelteon 20 mg capsules. If question was answered incorrectly please let me know and I can update.    TASIMELTEON CAP '20MG'$  is denied for not meeting the following prior authorization requirement(s). 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

## 2021-08-04 ENCOUNTER — Other Ambulatory Visit: Payer: Self-pay | Admitting: Physician Assistant

## 2021-08-04 ENCOUNTER — Telehealth: Payer: Self-pay

## 2021-08-04 MED ORDER — DAYVIGO 10 MG PO TABS: 10.0000 mg | ORAL_TABLET | Freq: Every evening | ORAL | 0 refills | Status: DC

## 2021-08-04 NOTE — Telephone Encounter (Signed)
Prior Authorization initiated and submitted for DAYVIGO 10 MG #7 with [employer],  Pending response

## 2021-08-04 NOTE — Telephone Encounter (Signed)
Ok thanks 

## 2021-08-04 NOTE — Telephone Encounter (Signed)
I only sent 7 b/c in the past she told me it didn't work. I didn't want to prescribe a large quantity if that was the case this time.  [facility] [address] Need to send it to [employer] [facility]?  I only want to do 30 pills at a time. Thanks.

## 2021-08-04 NOTE — Telephone Encounter (Signed)
Ok thanks. I'll wait to see what Traci says before I send anything.

## 2021-08-04 NOTE — Telephone Encounter (Signed)
Prior Approval received Dayvigo 10 mg #7 effective through 03/18/2022 with [employer] PA# [id number]

## 2021-08-04 NOTE — Telephone Encounter (Signed)
Shelton Silvas please check and see how the Jarold Motto is working. If effective this time, I'll send in Rx. The other med was denied. Thanks.

## 2021-08-04 NOTE — Telephone Encounter (Signed)
No 30 day is fine, [employer] is her pharmacy review for her PA"s.

## 2021-08-04 NOTE — Telephone Encounter (Signed)
Let me clarify [name] on the Dayvigo 10 mg you only sent #7 is that correct? I thought maybe she had a voucher or something is why you did that?

## 2021-08-04 NOTE — Telephone Encounter (Signed)
I called patient and she said she never got the medication because it was not covered by insurance. I thought she might be confused about the other medication and called the pharmacy and they said it was denied. Last time a PA was needed. I didn't see anything in Cover My Meds about Dayvigo and sent Traci a message to see if she had seen anything from the pharmacy.

## 2021-08-04 NOTE — Telephone Encounter (Signed)
Prior Approval received through 03/18/2022 with [employer] Dayvigo 10 mg #7

## 2021-08-04 NOTE — Telephone Encounter (Signed)
Patient notified of PA approval.

## 2021-08-08 ENCOUNTER — Telehealth: Payer: Self-pay | Admitting: Physician Assistant

## 2021-08-08 NOTE — Telephone Encounter (Deleted)
[facility] LVM at 1:35p.  They need a prior auth sent to the insurance company at [phone]  They will keep this medicine on hold until they receive the prior auth.  Pharmacy number is [phone].  Next appt 7/18

## 2021-08-08 NOTE — Telephone Encounter (Signed)
[facility] LVM at 1:35p.  They need a prior auth for Tasimelteon sent to the insurance company at [phone].  They will keep this medicine on hold until they receive the prior auth.  Pharmacy number is [phone].   Next appt 7/18

## 2021-08-08 NOTE — Telephone Encounter (Signed)
PA was denied

## 2021-08-09 DIAGNOSIS — Z79891 Long term (current) use of opiate analgesic: Secondary | ICD-10-CM | POA: Diagnosis not present

## 2021-08-09 DIAGNOSIS — M961 Postlaminectomy syndrome, not elsewhere classified: Secondary | ICD-10-CM | POA: Diagnosis not present

## 2021-08-09 DIAGNOSIS — M5136 Other intervertebral disc degeneration, lumbar region: Secondary | ICD-10-CM | POA: Diagnosis not present

## 2021-08-09 DIAGNOSIS — Z5181 Encounter for therapeutic drug level monitoring: Secondary | ICD-10-CM | POA: Diagnosis not present

## 2021-08-09 DIAGNOSIS — M5459 Other low back pain: Secondary | ICD-10-CM | POA: Diagnosis not present

## 2021-08-09 DIAGNOSIS — M5416 Radiculopathy, lumbar region: Secondary | ICD-10-CM | POA: Diagnosis not present

## 2021-08-09 DIAGNOSIS — G894 Chronic pain syndrome: Secondary | ICD-10-CM | POA: Diagnosis not present

## 2021-08-10 ENCOUNTER — Telehealth: Payer: Self-pay | Admitting: Physician Assistant

## 2021-08-10 NOTE — Telephone Encounter (Signed)
She will have to bring the remainder of the Dayvigo to our office for appropriate disposal before I can prescribe anything else.  Then will give [name]

## 2021-08-10 NOTE — Telephone Encounter (Signed)
Tried calling patient back to provide this information, but no answer and no way to leave a message.

## 2021-08-10 NOTE — Telephone Encounter (Signed)
Patient called regarding her Dayvigo '10mg'$  medication. States that medications is making her nauseous all day and she can't eat as well as making her really tired. Pls rtc to discuss [phone]

## 2021-08-10 NOTE — Telephone Encounter (Signed)
See message from patient. I called her and she didn't have anything else to add, except she would like something else for sleep. The PA was refused for the other medication.

## 2021-08-11 ENCOUNTER — Other Ambulatory Visit: Payer: Self-pay | Admitting: Physician Assistant

## 2021-08-11 MED ORDER — QUVIVIQ 25 MG PO TABS: 25.0000 mg | ORAL_TABLET | Freq: Every evening | ORAL | 0 refills | Status: DC

## 2021-08-11 NOTE — Telephone Encounter (Signed)
Called patient and this information was provided.

## 2021-08-16 ENCOUNTER — Telehealth: Payer: Self-pay | Admitting: Physician Assistant

## 2021-08-16 NOTE — Telephone Encounter (Signed)
Called patient twice. No answer and no way to leave a message.

## 2021-08-16 NOTE — Telephone Encounter (Signed)
[name] called to report that the pharmacy would not fill her Quviviq.  She didn't know why they wouldn't fill it.  But she asked if you would prescribe Klonopin instead.

## 2021-08-16 NOTE — Telephone Encounter (Signed)
No Klonopin. She can take a Xanax 30 minutes before bed if she has not tried that already.  We will have to wait and see about the prior authorization. Thanks

## 2021-08-17 NOTE — Telephone Encounter (Signed)
Patient notified of recommendations and that we were waiting for PA.

## 2021-08-18 ENCOUNTER — Telehealth: Payer: Self-pay

## 2021-08-18 NOTE — Telephone Encounter (Signed)
Patient notified of PA approval.

## 2021-08-18 NOTE — Telephone Encounter (Signed)
Prior Authorization submitted for QUVIVIQ 25 MG with [employer] Medicare, bin [id number], pcn [id number], grp COS ID# [id number] approved effective through 03/18/2022 PA# [id number]

## 2021-08-21 DIAGNOSIS — R110 Nausea: Secondary | ICD-10-CM | POA: Diagnosis not present

## 2021-08-24 ENCOUNTER — Telehealth: Payer: Self-pay | Admitting: Physician Assistant

## 2021-08-24 NOTE — Telephone Encounter (Signed)
Next visit is 10/03/21. Nickey called stating that the Dwana Curd is not helping her sleep at all. No sleep for the last few nights. Please call her at [phone]. [id number] [place] [address]  Phone:  [phone] Fax:  [phone]

## 2021-08-24 NOTE — Telephone Encounter (Signed)
Pt informed

## 2021-08-24 NOTE — Telephone Encounter (Signed)
Pt has been taking med for 2 days and stated it's not helping at all.She takes it at 11:30 pm.I asked what time does she fall asleep and she said many hours later,if at all.

## 2021-08-24 NOTE — Telephone Encounter (Signed)
Please let her know she has to take this everynight, same time like she has been doing, for 2 weeks before we can say it's a treatment failure.  Again stress the importance of no caffeine after lunch, she should not be on any electronic devices within 2 hours of the time that she needs to go to sleep.  No night lights in the room.  No television on, no radio on.  Nothing that will stimulate her. Thanks.

## 2021-08-28 ENCOUNTER — Telehealth: Payer: Self-pay | Admitting: Physician Assistant

## 2021-08-28 NOTE — Telephone Encounter (Signed)
Pt lvm at 3:58 pm stating that the sleeping medicine quviviq is not strong enough. She needs something stronger. Please call her at [phone]

## 2021-08-29 NOTE — Telephone Encounter (Signed)
Pt informed

## 2021-08-29 NOTE — Telephone Encounter (Signed)
Pt called last week with same message and you told her to give it more time

## 2021-08-29 NOTE — Telephone Encounter (Signed)
Have her stay on the Qviviq. It take a few weeks to become effective. Let her know I've talked with Dr. Felecia Shelling, her neurologist, about the insomnia and getting a sleep study done. He will get that set up through his office. They should contact her about this but if they haven't reached out by this time next week, have her call his office.  Thanks.

## 2021-09-12 ENCOUNTER — Other Ambulatory Visit: Payer: Self-pay | Admitting: Physician Assistant

## 2021-09-12 ENCOUNTER — Telehealth: Payer: Self-pay | Admitting: Physician Assistant

## 2021-09-12 MED ORDER — QUVIVIQ 50 MG PO TABS: 50.0000 mg | ORAL_TABLET | Freq: Every day | ORAL | 0 refills | Status: DC

## 2021-09-13 ENCOUNTER — Other Ambulatory Visit: Payer: Self-pay | Admitting: Neurology

## 2021-09-13 DIAGNOSIS — G4700 Insomnia, unspecified: Secondary | ICD-10-CM

## 2021-09-14 ENCOUNTER — Telehealth: Payer: Self-pay | Admitting: Neurology

## 2021-09-14 ENCOUNTER — Encounter: Payer: Self-pay | Admitting: Neurology

## 2021-09-14 NOTE — Telephone Encounter (Signed)
Because this patient is a medicare patient and the last time I can see she was seen was in 2021 I would need a telephone note of some sort of communication with the patient or a virtual visit.

## 2021-09-14 NOTE — Telephone Encounter (Signed)
Called the pt to discuss getting her in for a ov to discuss the need for SS.  Her phone number would not ring. It would automatically cut off/disconnect. I attempted multiple time. I called the daughter on DPR form [name] and there was no answer. LVM asking for a call back or to direct her mother to check mychart as I would also send a message  **If pt calls back please schedule a follow up visit with  Dr Felecia Shelling.

## 2021-09-25 ENCOUNTER — Other Ambulatory Visit: Payer: Self-pay | Admitting: Physician Assistant

## 2021-09-25 ENCOUNTER — Telehealth: Payer: Self-pay | Admitting: Physician Assistant

## 2021-09-25 MED ORDER — QUVIVIQ 50 MG PO TABS: 50.0000 mg | ORAL_TABLET | Freq: Every day | ORAL | 0 refills | Status: DC

## 2021-09-25 NOTE — Telephone Encounter (Signed)
Pt said she accidentally threw away her sleep med, Quviviq.  She estimates it was between 10 and 12 pills.  She wants to see if she can get more medication.  [facility] [address]  Next appt 7/18

## 2021-09-25 NOTE — Telephone Encounter (Signed)
Pt and pharmacy informed.

## 2021-09-25 NOTE — Telephone Encounter (Signed)
Please review

## 2021-09-25 NOTE — Telephone Encounter (Signed)
I sent in a new prescription.  Please call her pharmacy and tell them it is okay to fill it now, but in the end it is up to them and her insurance whether she can get it early or not.

## 2021-09-29 ENCOUNTER — Other Ambulatory Visit: Payer: Self-pay | Admitting: Physician Assistant

## 2021-09-29 DIAGNOSIS — G4709 Other insomnia: Secondary | ICD-10-CM

## 2021-09-29 DIAGNOSIS — F411 Generalized anxiety disorder: Secondary | ICD-10-CM

## 2021-09-29 DIAGNOSIS — G4700 Insomnia, unspecified: Secondary | ICD-10-CM

## 2021-10-03 ENCOUNTER — Encounter: Payer: Self-pay | Admitting: Physician Assistant

## 2021-10-03 ENCOUNTER — Ambulatory Visit (INDEPENDENT_AMBULATORY_CARE_PROVIDER_SITE_OTHER): Payer: Medicare Other | Admitting: Physician Assistant

## 2021-10-03 DIAGNOSIS — F319 Bipolar disorder, unspecified: Secondary | ICD-10-CM | POA: Diagnosis not present

## 2021-10-03 DIAGNOSIS — F411 Generalized anxiety disorder: Secondary | ICD-10-CM

## 2021-10-03 DIAGNOSIS — F99 Mental disorder, not otherwise specified: Secondary | ICD-10-CM

## 2021-10-03 DIAGNOSIS — F5105 Insomnia due to other mental disorder: Secondary | ICD-10-CM | POA: Diagnosis not present

## 2021-10-03 DIAGNOSIS — Z79899 Other long term (current) drug therapy: Secondary | ICD-10-CM | POA: Diagnosis not present

## 2021-10-03 MED ORDER — ALPRAZOLAM 1 MG PO TABS: 1.0000 mg | ORAL_TABLET | Freq: Three times a day (TID) | ORAL | 2 refills | Status: DC | PRN

## 2021-10-03 MED ORDER — AMITRIPTYLINE HCL 25 MG PO TABS: ORAL_TABLET | ORAL | 0 refills | Status: DC

## 2021-10-03 NOTE — Progress Notes (Signed)
[facility] Med Check  Patient ID: [name],  MRN: [id number]  PCP: Lujean Amel, MD  Date of Evaluation: 10/03/2021 Time spent:30 minutes  Chief Complaint:  Chief Complaint   Insomnia    Virtual Visit via Telehealth  I connected with patient by telephone, with their informed consent, and verified patient privacy and that I am speaking with the correct person using two identifiers.  I am private, in my office and the patient is at home.  I discussed the limitations, risks, security and privacy concerns of performing an evaluation and management service by telephone and the availability of in person appointments. I also discussed with the patient that there may be a patient responsible charge related to this service. The patient expressed understanding and agreed to proceed.   I discussed the assessment and treatment plan with the patient. The patient was provided an opportunity to ask questions and all were answered. The patient agreed with the plan and demonstrated an understanding of the instructions.   The patient was advised to call back or seek an in-person evaluation if the symptoms worsen or if the condition fails to improve as anticipated.  I provided 30 minutes of non-face-to-face time during this encounter.   HISTORY/CURRENT STATUS: HPI for routine med check.  Upset because she is not sleeping well.  She has been taking her daughter's amitriptyline and Thorazine and states "it works."  There have been other times when she takes someone else's meds, reports that they work and request prescriptions from me.  She has most recently been on Quviviq but reports it is not helpful either.  States she only sleeps 2 hours per night.  Does not nap during the day.  States she practices good sleep hygiene, no caffeine in any form afternoon.  She is not on any electronics at all.  She turns all the lights off as well as the TV within a few hours before her bedtime.  I have  recommended she contact Dr. Felecia Shelling, her neurologist, to schedule a sleep study.  I have even talked with him and he agreed to set it up.  Patient states she does not have a neurologist, although records show otherwise.  She saw Dr. Felecia Shelling when hospitalized a year ago and then in the office for follow-up.  States that her mood is okay, she would feel much better mood wise if she could get some good sleep though.  Energy and motivation are low because she is tired.  Appetite is normal and weight is stable.  ADLs are normal.  Personal hygiene normal.  Isolates for physical as well as mental reasons.  That is not new.  No suicidal or homicidal thoughts.  Patient denies increased energy with decreased need for sleep, no increased talkativeness, no racing thoughts, no impulsivity or risky behaviors, no increased spending, no increased libido, no grandiosity, no increased irritability or anger, no paranoia, and no hallucinations.  States anxiety is well-controlled with the Xanax.  Has panic attacks if she does not take it.  Generalized anxiety is more common however.  She has a sense of unease, like something bad is going to happen if she does not take it.  She has chronic back and neck pain.  She denies using oxycodone, however it is listed on her medication list and she gets regularly as per PDMP. Denies dizziness, syncope, seizures, numbness, tingling, tremor, tics, unsteady gait, slurred speech, confusion.  No abnormal muscle movements.  Individual Medical History/ Review of Systems: Changes? :Yes  see chart  Past medications for mental health diagnoses include: Trazodone, Belsomra, Risperdal, Zoloft, hydroxyzine, mirtazapine, Xanax, Valium, Wellbutrin, Abilify, gabapentin, Strattera, Vyvanse, Ambien, Lunesta, Dayvigo didn't help, Latuda, Quviviq, Sonata  Allergies: Antibiotic ear [neomycin-polymyxin-hc] and Penicillins  Current Medications:  Current Outpatient Medications:    acetaminophen (TYLENOL)  325 MG tablet, Take 2 tablets (650 mg total) by mouth every 6 (six) hours as needed for mild pain (or Fever >/= 101)., Disp: , Rfl:    amitriptyline (ELAVIL) 25 MG tablet, Take 1 po qhs for 3 nights, then 2 po qhs., Disp: 60 tablet, Rfl: 0   [facility] B-12 500 MCG tablet, Take 500 mcg by mouth daily., Disp: , Rfl:    hydrOXYzine (ATARAX) 50 MG tablet, TAKE 1-2 TABLETS (50-100 MG TOTAL) BY MOUTH AT BEDTIME AS NEEDED. (Patient taking differently: Take 50-100 mg by mouth at bedtime as needed for anxiety.), Disp: 180 tablet, Rfl: 2   risperiDONE (RISPERDAL) 3 MG tablet, TAKE 1 TABLET BY MOUTH AT BEDTIME., Disp: 90 tablet, Rfl: 0   ALPRAZolam (XANAX) 1 MG tablet, Take 1 tablet (1 mg total) by mouth 3 (three) times daily as needed for anxiety., Disp: 90 tablet, Rfl: 2   Oxycodone HCl 10 MG TABS, Take 0.5 tablets (5 mg total) by mouth 3 (three) times daily as needed (pain). (Patient not taking: Reported on 05/30/2021), Disp: , Rfl: 0   Tasimelteon 20 MG CAPS, Take 20 mg by mouth at bedtime. (Patient not taking: Reported on 10/03/2021), Disp: 30 capsule, Rfl: 1 Medication Side Effects: none  Family Medical/ Social History: Changes? No  MENTAL HEALTH EXAM:  There were no vitals taken for this visit.There is no height or weight on file to calculate BMI.  General Appearance:  Unable to assess  Eye Contact:   Unable to assess  Speech:  Clear and Coherent and Normal Rate  Volume:  Decreased  Mood:  Angry and Irritable  Affect:   Unable to assess  Thought Process:  Goal Directed and Descriptions of Associations: Circumstantial  Orientation:  Full (Time, Place, and Person)  Thought Content: Logical   Suicidal Thoughts:  No  Homicidal Thoughts:  No  Memory:   Fair  Judgement:  Poor  Insight:  Lacking  Psychomotor Activity:   Unable to assess  Concentration:  Concentration: Good  Recall:  [employer] of Knowledge: Fair  Language: Fair  Assets:  Desire for Improvement  ADL's:  Intact  Cognition: WNL   Prognosis:  Fair    DIAGNOSES:    ICD-10-CM   1. Insomnia due to other mental disorder  F51.05    F99     2. Bipolar I disorder ([place])  F31.9     3. Generalized anxiety disorder  F41.1     4. Polypharmacy  Z79.899       Receiving Psychotherapy: No   RECOMMENDATIONS:  PDMP was reviewed. Last Xanax filled 08/30/2021.  Last oxycodone filled 09/14/2021 but states she is not taking it.  It is getting filled every 30 days or close to that.  (Quviviq filled 09/13/2021.   I provided 30 minutes of non-face-to-face time during this encounter, including time spent before and after the visit in records review, medical decision making, counseling pertinent to today's visit, and charting.   We had a long discussiion concerning sleep hygiene and medications to treat insomnia.  There have been numerous times when she will not give the medication a chance to work.  She calls multiple times between visits requesting other meds.  And I reminded her it is illegal to take someone else's medication.  It can be dangerous, even deadly.  When she was told again that I wanted her to see her neurologist for a sleep study she yelled and said "I do not have a neurologist."  Since she is adamant about that I will refer her to [facility] Sleep specialist, and appreciate any help they are able to give me.  Once she sees them, will defer to them for any sleep medications prescribed.  At this point I will prescribe 1 or the other of amitriptyline or Thorazine for sleep.  Out of the 2, I feel that amitriptyline has less side effects, she has no history of QT interval issues.  Benefits, risks and side effects were discussed and she accepts.  Smoking cessation discussed.  States she is not ready to quit.  I reminded her of the increased risk of confusion, imbalance, memory problems, and falls due to any benzodiazepine.  Then the falls can then lead to broken bones, needing hip replacement or the like.  I recommend decreasing the dose  of Xanax with the intent of stopping it altogether but she has been on it so long and it is helpful so she will not stop it.  She is fully aware of these risks and accepts them.  At this point I think the benefit of the Xanax outweighs the risk of confusion, imbalance, memory issues, and falls.  She has been on the Xanax for years so if/when she is agreeable to wean, it would have to be a very slow taper.  Discontinue Quviviq. Continue Xanax 1 mg, 1 p.o. 3 times daily as needed, take as little as possible. Start amitriptyline 25 mg 1 p.o. nightly for 3 nights and then 2 p.o. nightly. Continue hydroxyzine 50 mg, 1-2 p.o. nightly as needed sleep. Continue Risperdal 3 mg, 1 p.o. nightly. Continue multivitamin, B12, recommend vitamin D and fish oil. Recommend counseling. Return in 4 months.  Donnal Moat, PA-C

## 2021-10-04 DIAGNOSIS — M5459 Other low back pain: Secondary | ICD-10-CM | POA: Diagnosis not present

## 2021-10-04 DIAGNOSIS — M961 Postlaminectomy syndrome, not elsewhere classified: Secondary | ICD-10-CM | POA: Diagnosis not present

## 2021-10-04 DIAGNOSIS — G894 Chronic pain syndrome: Secondary | ICD-10-CM | POA: Diagnosis not present

## 2021-10-04 DIAGNOSIS — M5136 Other intervertebral disc degeneration, lumbar region: Secondary | ICD-10-CM | POA: Diagnosis not present

## 2021-10-07 ENCOUNTER — Encounter: Payer: Self-pay | Admitting: Physician Assistant

## 2021-10-09 ENCOUNTER — Telehealth: Payer: Self-pay | Admitting: Physician Assistant

## 2021-10-09 NOTE — Telephone Encounter (Signed)
Referral has been faxed to [facility]

## 2021-10-09 NOTE — Telephone Encounter (Signed)
Noted  

## 2021-10-16 ENCOUNTER — Other Ambulatory Visit: Payer: Self-pay | Admitting: Physician Assistant

## 2021-10-16 DIAGNOSIS — R110 Nausea: Secondary | ICD-10-CM | POA: Diagnosis not present

## 2021-10-16 DIAGNOSIS — R0989 Other specified symptoms and signs involving the circulatory and respiratory systems: Secondary | ICD-10-CM | POA: Diagnosis not present

## 2021-10-16 DIAGNOSIS — R000 Tachycardia, unspecified: Secondary | ICD-10-CM | POA: Diagnosis not present

## 2021-10-16 DIAGNOSIS — K30 Functional dyspepsia: Secondary | ICD-10-CM | POA: Diagnosis not present

## 2021-10-16 NOTE — Telephone Encounter (Signed)
Verneal called to day at 11:51 to report that she has run out of her Xanax.  It is not due for refill until 8/10.  She has to fly out of town on Thursday.  She is asking for a 10 day supply of her Xanax to tie her over until she can get her normal refill and so she can fly.  Appt 11:20.[facility] [address]

## 2021-10-16 NOTE — Telephone Encounter (Signed)
Please review

## 2021-10-16 NOTE — Telephone Encounter (Signed)
Please call her pharmacy to see when she had it filled.  PDMP says 08/30/2021 was the last time.

## 2021-10-17 MED ORDER — ALPRAZOLAM 1 MG PO TABS
1.0000 mg | ORAL_TABLET | Freq: Three times a day (TID) | ORAL | 0 refills | Status: DC | PRN
Start: 2021-10-17 — End: 2021-12-21

## 2021-10-19 ENCOUNTER — Telehealth: Payer: Medicare Other | Admitting: Physician Assistant

## 2021-10-19 DIAGNOSIS — M25579 Pain in unspecified ankle and joints of unspecified foot: Secondary | ICD-10-CM

## 2021-10-19 NOTE — Progress Notes (Signed)
Message sent to patient requesting further input regarding current symptoms. Awaiting patient response.  

## 2021-10-19 NOTE — Progress Notes (Signed)
Because of severe ankle pain and need for detailed examination, I feel your condition warrants further evaluation and I recommend that you be seen in a face to face visit.   NOTE: There will be NO CHARGE for this eVisit   If you are having a true medical emergency please call 911.      For an urgent face to face visit, [facility] has seven urgent care centers for your convenience:     [facility] [place] [facility] [place] Get Driving Directions [phone] [address], [place] [address]    [facility]) Get Driving Directions [phone] [place], [place] [address]  [facility] [place] [facility]) Get Driving Directions [phone] [address] [place],  [place]  [address]  [facility] [place] [facility] - at [address] Get Driving Directions  [phone] [place],  [place] [address]   [facility] Get Driving Directions [phone] [address], [place], [place] [address]   [facility] Get Driving Directions  [phone] [address], [place] [address]   [facility] [place] Get Driving Directions [phone] [address] [place], [place] [address]  Your MyChart E-visit questionnaire answers were reviewed by a board certified advanced clinical practitioner to complete your personal care plan based on your specific symptoms.  Thank you for using e-Visits.

## 2021-10-23 DIAGNOSIS — I2699 Other pulmonary embolism without acute cor pulmonale: Secondary | ICD-10-CM | POA: Diagnosis not present

## 2021-10-23 DIAGNOSIS — R55 Syncope and collapse: Secondary | ICD-10-CM | POA: Diagnosis not present

## 2021-10-23 DIAGNOSIS — R188 Other ascites: Secondary | ICD-10-CM | POA: Diagnosis not present

## 2021-10-23 DIAGNOSIS — E43 Unspecified severe protein-calorie malnutrition: Secondary | ICD-10-CM | POA: Diagnosis not present

## 2021-10-23 DIAGNOSIS — G253 Myoclonus: Secondary | ICD-10-CM | POA: Diagnosis not present

## 2021-10-23 DIAGNOSIS — R1312 Dysphagia, oropharyngeal phase: Secondary | ICD-10-CM | POA: Diagnosis not present

## 2021-10-23 DIAGNOSIS — E876 Hypokalemia: Secondary | ICD-10-CM | POA: Diagnosis not present

## 2021-10-23 DIAGNOSIS — N200 Calculus of kidney: Secondary | ICD-10-CM | POA: Diagnosis not present

## 2021-10-23 DIAGNOSIS — I7771 Dissection of carotid artery: Secondary | ICD-10-CM | POA: Diagnosis not present

## 2021-10-23 DIAGNOSIS — I82C11 Acute embolism and thrombosis of right internal jugular vein: Secondary | ICD-10-CM | POA: Diagnosis not present

## 2021-10-23 DIAGNOSIS — D6862 Lupus anticoagulant syndrome: Secondary | ICD-10-CM | POA: Diagnosis not present

## 2021-10-23 DIAGNOSIS — J90 Pleural effusion, not elsewhere classified: Secondary | ICD-10-CM | POA: Diagnosis not present

## 2021-10-23 DIAGNOSIS — I509 Heart failure, unspecified: Secondary | ICD-10-CM | POA: Diagnosis not present

## 2021-10-23 DIAGNOSIS — I82409 Acute embolism and thrombosis of unspecified deep veins of unspecified lower extremity: Secondary | ICD-10-CM | POA: Diagnosis not present

## 2021-10-23 DIAGNOSIS — R569 Unspecified convulsions: Secondary | ICD-10-CM | POA: Diagnosis not present

## 2021-10-23 DIAGNOSIS — Z862 Personal history of diseases of the blood and blood-forming organs and certain disorders involving the immune mechanism: Secondary | ICD-10-CM | POA: Diagnosis not present

## 2021-10-23 DIAGNOSIS — R110 Nausea: Secondary | ICD-10-CM | POA: Diagnosis not present

## 2021-10-23 DIAGNOSIS — R930 Abnormal findings on diagnostic imaging of skull and head, not elsewhere classified: Secondary | ICD-10-CM | POA: Diagnosis not present

## 2021-10-23 DIAGNOSIS — Z681 Body mass index (BMI) 19 or less, adult: Secondary | ICD-10-CM | POA: Diagnosis not present

## 2021-10-23 DIAGNOSIS — N390 Urinary tract infection, site not specified: Secondary | ICD-10-CM | POA: Diagnosis not present

## 2021-10-23 DIAGNOSIS — Z981 Arthrodesis status: Secondary | ICD-10-CM | POA: Diagnosis not present

## 2021-10-23 DIAGNOSIS — I80291 Phlebitis and thrombophlebitis of other deep vessels of right lower extremity: Secondary | ICD-10-CM | POA: Diagnosis not present

## 2021-10-23 DIAGNOSIS — G894 Chronic pain syndrome: Secondary | ICD-10-CM | POA: Diagnosis not present

## 2021-10-23 DIAGNOSIS — I6523 Occlusion and stenosis of bilateral carotid arteries: Secondary | ICD-10-CM | POA: Diagnosis not present

## 2021-10-23 DIAGNOSIS — I82403 Acute embolism and thrombosis of unspecified deep veins of lower extremity, bilateral: Secondary | ICD-10-CM | POA: Diagnosis not present

## 2021-10-23 DIAGNOSIS — Z950 Presence of cardiac pacemaker: Secondary | ICD-10-CM | POA: Diagnosis not present

## 2021-10-23 DIAGNOSIS — D638 Anemia in other chronic diseases classified elsewhere: Secondary | ICD-10-CM | POA: Diagnosis not present

## 2021-10-23 DIAGNOSIS — J432 Centrilobular emphysema: Secondary | ICD-10-CM | POA: Diagnosis not present

## 2021-10-23 DIAGNOSIS — R918 Other nonspecific abnormal finding of lung field: Secondary | ICD-10-CM | POA: Diagnosis not present

## 2021-10-23 DIAGNOSIS — I82452 Acute embolism and thrombosis of left peroneal vein: Secondary | ICD-10-CM | POA: Diagnosis not present

## 2021-10-23 DIAGNOSIS — I82890 Acute embolism and thrombosis of other specified veins: Secondary | ICD-10-CM | POA: Diagnosis not present

## 2021-10-23 DIAGNOSIS — K5730 Diverticulosis of large intestine without perforation or abscess without bleeding: Secondary | ICD-10-CM | POA: Diagnosis not present

## 2021-10-23 DIAGNOSIS — I700 Atherosclerosis of aorta: Secondary | ICD-10-CM | POA: Diagnosis not present

## 2021-10-23 DIAGNOSIS — I82C12 Acute embolism and thrombosis of left internal jugular vein: Secondary | ICD-10-CM | POA: Diagnosis not present

## 2021-10-23 DIAGNOSIS — N3000 Acute cystitis without hematuria: Secondary | ICD-10-CM | POA: Diagnosis not present

## 2021-10-23 DIAGNOSIS — I2693 Single subsegmental pulmonary embolism without acute cor pulmonale: Secondary | ICD-10-CM | POA: Diagnosis not present

## 2021-10-23 DIAGNOSIS — M79605 Pain in left leg: Secondary | ICD-10-CM | POA: Diagnosis not present

## 2021-10-23 DIAGNOSIS — I80251 Phlebitis and thrombophlebitis of right calf muscular vein: Secondary | ICD-10-CM | POA: Diagnosis not present

## 2021-10-23 DIAGNOSIS — Z0189 Encounter for other specified special examinations: Secondary | ICD-10-CM | POA: Diagnosis not present

## 2021-10-23 DIAGNOSIS — R64 Cachexia: Secondary | ICD-10-CM | POA: Diagnosis not present

## 2021-10-23 DIAGNOSIS — D72829 Elevated white blood cell count, unspecified: Secondary | ICD-10-CM | POA: Diagnosis not present

## 2021-10-23 DIAGNOSIS — G9341 Metabolic encephalopathy: Secondary | ICD-10-CM | POA: Diagnosis not present

## 2021-10-23 DIAGNOSIS — G08 Intracranial and intraspinal phlebitis and thrombophlebitis: Secondary | ICD-10-CM | POA: Diagnosis not present

## 2021-10-23 DIAGNOSIS — Z743 Need for continuous supervision: Secondary | ICD-10-CM | POA: Diagnosis not present

## 2021-10-23 DIAGNOSIS — I82442 Acute embolism and thrombosis of left tibial vein: Secondary | ICD-10-CM | POA: Diagnosis not present

## 2021-10-23 DIAGNOSIS — C9590 Leukemia, unspecified not having achieved remission: Secondary | ICD-10-CM | POA: Diagnosis not present

## 2021-10-24 DIAGNOSIS — E876 Hypokalemia: Secondary | ICD-10-CM | POA: Diagnosis not present

## 2021-10-24 DIAGNOSIS — R569 Unspecified convulsions: Secondary | ICD-10-CM | POA: Diagnosis not present

## 2021-10-24 DIAGNOSIS — D72829 Elevated white blood cell count, unspecified: Secondary | ICD-10-CM | POA: Diagnosis not present

## 2021-10-25 DIAGNOSIS — E43 Unspecified severe protein-calorie malnutrition: Secondary | ICD-10-CM | POA: Insufficient documentation

## 2021-10-26 ENCOUNTER — Telehealth: Payer: Self-pay

## 2021-10-26 ENCOUNTER — Ambulatory Visit: Payer: Medicare Other | Admitting: Nurse Practitioner

## 2021-10-26 ENCOUNTER — Other Ambulatory Visit: Payer: Self-pay | Admitting: Physician Assistant

## 2021-10-26 DIAGNOSIS — N3000 Acute cystitis without hematuria: Secondary | ICD-10-CM | POA: Diagnosis present

## 2021-10-26 NOTE — Telephone Encounter (Signed)
The patient daughter [name] wanted to know if the patient pacemaker is MRI compatible. I told her the nurse will give her a call back. The facility that does the pacemaker normally sends [place] a clearance form to fill out.

## 2021-10-26 NOTE — Telephone Encounter (Signed)
Called patient advised that her pacemaker is NOT compatible with MRI. Appreciative for calling.

## 2021-10-27 ENCOUNTER — Telehealth: Payer: Self-pay | Admitting: Cardiovascular Disease

## 2021-10-27 DIAGNOSIS — I82452 Acute embolism and thrombosis of left peroneal vein: Secondary | ICD-10-CM | POA: Insufficient documentation

## 2021-10-27 DIAGNOSIS — I2699 Other pulmonary embolism without acute cor pulmonale: Secondary | ICD-10-CM | POA: Insufficient documentation

## 2021-10-27 DIAGNOSIS — I82890 Acute embolism and thrombosis of other specified veins: Secondary | ICD-10-CM | POA: Insufficient documentation

## 2021-10-27 NOTE — Telephone Encounter (Signed)
Urban Gibson, NP from [facility] called and wanted to make sure that the patient can get an MRI brain without or possibly with contrast done due to having a pacemaker.

## 2021-10-29 NOTE — Telephone Encounter (Signed)
Her pacemaker is not an MRI conditional device.

## 2021-10-30 NOTE — Telephone Encounter (Signed)
Urban Gibson, NP has been made aware and verbalized her understanding.

## 2021-11-03 DIAGNOSIS — I2699 Other pulmonary embolism without acute cor pulmonale: Secondary | ICD-10-CM | POA: Diagnosis not present

## 2021-11-06 DIAGNOSIS — N390 Urinary tract infection, site not specified: Secondary | ICD-10-CM | POA: Diagnosis not present

## 2021-11-06 DIAGNOSIS — I82C11 Acute embolism and thrombosis of right internal jugular vein: Secondary | ICD-10-CM | POA: Diagnosis not present

## 2021-11-06 DIAGNOSIS — G08 Intracranial and intraspinal phlebitis and thrombophlebitis: Secondary | ICD-10-CM | POA: Diagnosis not present

## 2021-11-06 DIAGNOSIS — K30 Functional dyspepsia: Secondary | ICD-10-CM | POA: Diagnosis not present

## 2021-11-06 DIAGNOSIS — R569 Unspecified convulsions: Secondary | ICD-10-CM | POA: Diagnosis not present

## 2021-11-06 DIAGNOSIS — Z7901 Long term (current) use of anticoagulants: Secondary | ICD-10-CM | POA: Diagnosis not present

## 2021-11-06 DIAGNOSIS — Z5181 Encounter for therapeutic drug level monitoring: Secondary | ICD-10-CM | POA: Diagnosis not present

## 2021-11-06 DIAGNOSIS — I2699 Other pulmonary embolism without acute cor pulmonale: Secondary | ICD-10-CM | POA: Diagnosis not present

## 2021-11-07 ENCOUNTER — Observation Stay (HOSPITAL_BASED_OUTPATIENT_CLINIC_OR_DEPARTMENT_OTHER)
Admission: EM | Admit: 2021-11-07 | Discharge: 2021-11-08 | Disposition: A | Discharge status: Home / Self Care | Payer: Medicare Other | Attending: Internal Medicine | Admitting: Internal Medicine

## 2021-11-07 ENCOUNTER — Other Ambulatory Visit: Payer: Self-pay

## 2021-11-07 ENCOUNTER — Encounter (HOSPITAL_BASED_OUTPATIENT_CLINIC_OR_DEPARTMENT_OTHER): Payer: Self-pay | Admitting: Pharmacy Technician

## 2021-11-07 ENCOUNTER — Encounter (HOSPITAL_COMMUNITY): Payer: Self-pay

## 2021-11-07 ENCOUNTER — Emergency Department (HOSPITAL_BASED_OUTPATIENT_CLINIC_OR_DEPARTMENT_OTHER): Payer: Medicare Other | Admitting: Radiology

## 2021-11-07 DIAGNOSIS — Z8541 Personal history of malignant neoplasm of cervix uteri: Secondary | ICD-10-CM | POA: Diagnosis not present

## 2021-11-07 DIAGNOSIS — I668 Occlusion and stenosis of other cerebral arteries: Secondary | ICD-10-CM | POA: Diagnosis not present

## 2021-11-07 DIAGNOSIS — I82402 Acute embolism and thrombosis of unspecified deep veins of left lower extremity: Secondary | ICD-10-CM | POA: Diagnosis not present

## 2021-11-07 DIAGNOSIS — Z950 Presence of cardiac pacemaker: Secondary | ICD-10-CM | POA: Diagnosis not present

## 2021-11-07 DIAGNOSIS — R569 Unspecified convulsions: Secondary | ICD-10-CM

## 2021-11-07 DIAGNOSIS — R791 Abnormal coagulation profile: Principal | ICD-10-CM | POA: Diagnosis present

## 2021-11-07 DIAGNOSIS — Z7901 Long term (current) use of anticoagulants: Secondary | ICD-10-CM | POA: Diagnosis not present

## 2021-11-07 DIAGNOSIS — T45511A Poisoning by anticoagulants, accidental (unintentional), initial encounter: Secondary | ICD-10-CM | POA: Diagnosis not present

## 2021-11-07 DIAGNOSIS — G08 Intracranial and intraspinal phlebitis and thrombophlebitis: Secondary | ICD-10-CM

## 2021-11-07 DIAGNOSIS — Z86711 Personal history of pulmonary embolism: Secondary | ICD-10-CM | POA: Diagnosis present

## 2021-11-07 DIAGNOSIS — R0682 Tachypnea, not elsewhere classified: Secondary | ICD-10-CM | POA: Diagnosis not present

## 2021-11-07 DIAGNOSIS — F17210 Nicotine dependence, cigarettes, uncomplicated: Secondary | ICD-10-CM | POA: Insufficient documentation

## 2021-11-07 DIAGNOSIS — Z79899 Other long term (current) drug therapy: Secondary | ICD-10-CM | POA: Insufficient documentation

## 2021-11-07 DIAGNOSIS — F319 Bipolar disorder, unspecified: Secondary | ICD-10-CM | POA: Diagnosis present

## 2021-11-07 DIAGNOSIS — R29818 Other symptoms and signs involving the nervous system: Secondary | ICD-10-CM | POA: Insufficient documentation

## 2021-11-07 DIAGNOSIS — I2699 Other pulmonary embolism without acute cor pulmonale: Secondary | ICD-10-CM | POA: Diagnosis not present

## 2021-11-07 LAB — CBC
HCT: 37.7 % (ref 36.0–46.0)
Hemoglobin: 12.3 g/dL (ref 12.0–15.0)
MCH: 28.3 pg (ref 26.0–34.0)
MCHC: 32.6 g/dL (ref 30.0–36.0)
MCV: 86.7 fL (ref 80.0–100.0)
Platelets: 254 10*3/uL (ref 150–400)
RBC: 4.35 MIL/uL (ref 3.87–5.11)
RDW: 13.8 % (ref 11.5–15.5)
WBC: 10.5 10*3/uL (ref 4.0–10.5)
nRBC: 0 % (ref 0.0–0.2)

## 2021-11-07 LAB — PROTIME-INR
INR: >10 (ref 0.8–1.2)
Prothrombin Time: >90 seconds — ABNORMAL HIGH (ref 11.4–15.2)

## 2021-11-07 LAB — BASIC METABOLIC PANEL
Anion gap: 12 (ref 5–15)
BUN: 9 mg/dL (ref 8–23)
CO2: 25 mmol/L (ref 22–32)
Calcium: 8.9 mg/dL (ref 8.9–10.3)
Chloride: 108 mmol/L (ref 98–111)
Creatinine, Ser: 0.75 mg/dL (ref 0.44–1.00)
GFR, Estimated: >60 mL/min (ref >60)
Glucose, Bld: 91 mg/dL (ref 70–99)
Potassium: 3.5 mmol/L (ref 3.5–5.1)
Sodium: 145 mmol/L (ref 135–145)

## 2021-11-07 MED ORDER — ACETAMINOPHEN 325 MG PO TABS: 650.0000 mg | ORAL_TABLET | Freq: Four times a day (QID) | ORAL | Status: DC | PRN

## 2021-11-07 MED ORDER — RISPERIDONE 1 MG PO TABS
3.0000 mg | ORAL_TABLET | Freq: Every day | ORAL | Status: DC
  Administered 2021-11-07: 3 mg via ORAL
  Filled 2021-11-07: qty 3

## 2021-11-07 MED ORDER — VITAMIN K1 10 MG/ML IJ SOLN
INTRAMUSCULAR | Status: AC
  Filled 2021-11-07: qty 1

## 2021-11-07 MED ORDER — HYDROXYZINE HCL 10 MG PO TABS
10.0000 mg | ORAL_TABLET | Freq: Once | ORAL | Status: AC
  Administered 2021-11-07: 10 mg via ORAL
  Filled 2021-11-07: qty 1

## 2021-11-07 MED ORDER — ONDANSETRON HCL 4 MG PO TABS: 4.0000 mg | ORAL_TABLET | Freq: Four times a day (QID) | ORAL | Status: DC | PRN

## 2021-11-07 MED ORDER — ONDANSETRON HCL 4 MG/2ML IJ SOLN: 4.0000 mg | Freq: Four times a day (QID) | INTRAMUSCULAR | Status: DC | PRN

## 2021-11-07 MED ORDER — AMITRIPTYLINE HCL 25 MG PO TABS
50.0000 mg | ORAL_TABLET | Freq: Every day | ORAL | Status: DC
  Administered 2021-11-07: 50 mg via ORAL
  Filled 2021-11-07: qty 2

## 2021-11-07 MED ORDER — ALPRAZOLAM 0.5 MG PO TABS
1.0000 mg | ORAL_TABLET | Freq: Three times a day (TID) | ORAL | Status: DC | PRN
Start: 2021-11-07 — End: 2021-11-08
  Administered 2021-11-07: 1 mg via ORAL
  Filled 2021-11-07: qty 2

## 2021-11-07 MED ORDER — SENNOSIDES-DOCUSATE SODIUM 8.6-50 MG PO TABS: 1.0000 | ORAL_TABLET | Freq: Every evening | ORAL | Status: DC | PRN

## 2021-11-07 MED ORDER — VITAMIN K1 10 MG/ML IJ SOLN
2.5000 mg | Freq: Once | INTRAVENOUS | Status: AC
  Administered 2021-11-07: 2.5 mg via INTRAVENOUS
  Filled 2021-11-07: qty 0.25

## 2021-11-07 MED ORDER — ACETAMINOPHEN 650 MG RE SUPP: 650.0000 mg | Freq: Four times a day (QID) | RECTAL | Status: DC | PRN

## 2021-11-07 MED ORDER — SODIUM CHLORIDE 0.9% FLUSH
3.0000 mL | Freq: Two times a day (BID) | INTRAVENOUS | Status: DC
  Administered 2021-11-07 – 2021-11-08 (×2): 3 mL via INTRAVENOUS

## 2021-11-07 MED ORDER — DIVALPROEX SODIUM 250 MG PO DR TAB
250.0000 mg | DELAYED_RELEASE_TABLET | Freq: Three times a day (TID) | ORAL | Status: DC
  Administered 2021-11-08: 250 mg via ORAL
  Filled 2021-11-07 (×2): qty 1

## 2021-11-07 NOTE — Assessment & Plan Note (Signed)
Recent admit at OSH for seizure-like activity.  Started on Depakote 250 mg every 8 hours. -Continue Depakote -Follow up with neurology, previously seen by Dr. Felecia Shelling

## 2021-11-07 NOTE — H&P (Signed)
History and Physical    [name] ZOX:[id number] DOB: 06-Sep-1955 DOA: 11/07/2021  PCP: Lujean Amel, MD  Patient coming from: Home  I have personally briefly reviewed patient's old medical records in [place]  Chief Complaint: Supratherapeutic INR  HPI: [name] is a 66 y.o. female with medical history significant for syncope/sinus pause s/p PPM, bipolar disorder, depression/anxiety, recent diagnoses of cerebral sinus thrombosis and PE/DVT at OSH who presented to the ED for evaluation of supratherapeutic INR.  Patient was recently admitted to [facility] in [place], [place] while she was visiting.  She was admitted 10/23/2021-11/01/2021.  Initially presented with seizure like activity and started on Depakote.  Continuous video EEG showed bilateral and focal cerebral dysfunction and focal cortical irritability which improved with antiseizure drug therapy.  While in the hospital she was also found to have lobar, segmental, subsegmental PE within the right lung; LLE DVT; as well as nonocclusive dural venous sinus thrombosis involving the left transverse sinus and sigmoid sinus extending into the proximal left internal jugular vein.  She was initially on Lovenox and subsequently started on Coumadin with plan to continue both until INR was therapeutic.  There was concern for antiphospholipid syndrome which appears to be the reason why Coumadin was chosen.  INR was 2.65 on 8/16 on day of discharge.  Despite this she was continued on and prescribed Lovenox plus Coumadin.  She did have follow-up with the hematology clinic on 8/18 at which time repeat INR was 5.6.  The clinic was unable to reach patient or her daughter with the lab result.  Patient states that she has been using both Lovenox and Coumadin.  She says she was accidentally taking Coumadin twice a day for a few days until she realized her mistake and decreased back to once daily.  She has not seen any  obvious bleeding including epistaxis, hemoptysis, hematemesis, hematuria, hematochezia, melena.  She denies any chest pain, dyspnea, abdominal pain, dysuria.  [facility] ED Course  Labs/Imaging on admission: I have personally reviewed following labs and imaging studies.  Initial vitals showed BP 137/98, pulse 98, RR 20, temp 98.1 F, SPO2 100% on room air.  Labs showed PT >90, INR >10.  Sodium 145, potassium 3.5, bicarb 25, BUN 9, creatinine 0.75, serum glucose 91, WBC 10.5, hemoglobin 12.3, platelets 284,000.  2 view chest x-ray shows stable left sided PPM in place without focal consolidation, edema, effusion.  Patient was given IV vitamin K 2.5 mg and the hospitalist service was consulted to admit for further evaluation and management.  Review of Systems: All systems reviewed and are negative except as documented in history of present illness above.   Past Medical History:  Diagnosis Date   Anxiety    anxiety and mild depressive order systoms   Bipolar disorder ([place])     (02/20/2013)   Cervical cancer ([place]) 03/19/1980   Depression    Dysrhythmia    Headache(784.0)    "monthly" (02/20/2013)   Pacemaker    medtronic   PONV (postoperative nausea and vomiting)    Sinus pause 02/14/2013   Syncope and collapse    echo-April 12,2012-EF 55% Echo normal; recorder with prolonged sinus pauses --> status post   Tobacco abuse    Vertigo     Past Surgical History:  Procedure Laterality Date   ABDOMINAL HYSTERECTOMY  03/19/1980   "partial"   BACK SURGERY     CERVICAL FUSION Left 11/2013   C4-C6   CERVICAL FUSION  3,4,5,   INSERT / REPLACE / REMOVE PACEMAKER  02/20/2013   medtronic   LOOP RECORDER IMPLANT N/A 11/06/2012   Procedure: LINQ LOOP RECORDER IMPLANT;  Surgeon: Sanda Klein, MD;  Location: [facility] CATH [facility];  Service: Cardiovascular;  Laterality: N/A;   NECK SURGERY  11/17/2013   PACEMAKER PLACEMENT N/A 02/16/2013   PERMANENT PACEMAKER INSERTION N/A 02/20/2013    Procedure: PERMANENT PACEMAKER INSERTION;  Surgeon: Sanda Klein, MD;  Location: [facility];  Service: Cardiovascular;  Laterality: N/A;   POSTERIOR LUMBAR FUSION  ~ 2009   ROBOTIC ASSISTED SALPINGO OOPHERECTOMY Bilateral 11/25/2018   Procedure: XI ROBOTIC ASSISTED BILATERAL SALPINGO OOPHORECTOMY and LYSIS OF ADHESIONS.;  Surgeon: Everitt Amber, MD;  Location: [facility];  Service: Gynecology;  Laterality: Bilateral;   SPINAL FUSION  03/19/2014   TONSILLECTOMY     TRANSTHORACIC ECHOCARDIOGRAM  07/11/2010   The left atrial size is normal.There is no evidence of mitral vavle prolaspe.Right ventriicular systolic pressure is normal . Injection of contrast documented no interatrial shunt. Essentially normal 2D echo -doppler study     Social History:  reports that she has been smoking cigarettes. She has a 30.00 pack-year smoking history. She has never used smokeless tobacco. She reports that she does not drink alcohol and does not use drugs.  Allergies  Allergen Reactions   Antibiotic Ear [Neomycin-Polymyxin-Hc]     Other reaction(s): Unknown   Penicillins Other (See Comments)    Yeast infection Did it involve swelling of the face/tongue/throat, SOB, or low BP? No Did it involve sudden or severe rash/hives, skin peeling, or any reaction on the inside of your mouth or nose? No Did you need to seek medical attention at a hospital or doctor's office? Yes When did it last happen? More than 5 years ago  If all above answers are "NO", may proceed with cephalosporin use.     Family History  Adopted: Yes  Problem Relation Age of Onset   Heart attack Brother      Prior to Admission medications   Medication Sig Start Date End Date Taking? Authorizing Provider  acetaminophen (TYLENOL) 325 MG tablet Take 2 tablets (650 mg total) by mouth every 6 (six) hours as needed for mild pain (or Fever >/= 101). 02/26/20   Domenic Polite, MD  ALPRAZolam Duanne Moron) 1 MG tablet Take 1 tablet (1 mg total) by mouth 3  (three) times daily as needed for anxiety. Must last at least 30 days. 10/17/21   Donnal Moat T, PA-C  amitriptyline (ELAVIL) 25 MG tablet Take 2 tablets (50 mg total) by mouth at bedtime. 10/27/21   Donnal Moat T, PA-C  [employer] B-12 500 MCG tablet Take 500 mcg by mouth daily. 02/13/21   [provider]  hydrOXYzine (ATARAX) 50 MG tablet TAKE 1-2 TABLETS (50-100 MG TOTAL) BY MOUTH AT BEDTIME AS NEEDED. Patient taking differently: Take 50-100 mg by mouth at bedtime as needed for anxiety. 03/27/21   Addison Lank, PA-C  Oxycodone HCl 10 MG TABS Take 0.5 tablets (5 mg total) by mouth 3 (three) times daily as needed (pain). Patient not taking: Reported on 05/30/2021 03/12/21   Raiford Noble Latif, DO  risperiDONE (RISPERDAL) 3 MG tablet TAKE 1 TABLET BY MOUTH AT BEDTIME. 09/29/21   Hurst, Helene Kelp T, PA-C  Tasimelteon 20 MG CAPS Take 20 mg by mouth at bedtime. Patient not taking: Reported on 10/03/2021 07/25/21   Addison Lank, PA-C    Physical Exam: Vitals:   11/07/21 2000 11/07/21 2045 11/07/21 2100  11/07/21 2148  BP: (!) 145/90  (!) 137/92 (!) 156/104  Pulse: 83 82 81 82  Resp:  '20 14 20  '$ Temp:   99.4 F (37.4 C) 99 F (37.2 C)  TempSrc:   Oral Oral  SpO2: 100% 97% 97% 100%  Weight:    42.6 kg  Height:    '5\' 7"'$  (1.702 m)   Constitutional: Resting in bed, NAD, calm, comfortable Eyes: EOMI, lids and conjunctivae normal ENMT: Mucous membranes are moist. Posterior pharynx clear of any exudate or lesions.Normal dentition.  Neck: normal, supple, no masses. Respiratory: clear to auscultation bilaterally, no wheezing, no crackles. Normal respiratory effort. No accessory muscle use.  Cardiovascular: Regular rate and rhythm, no murmurs / rubs / gallops. No extremity edema. 2+ pedal pulses. Abdomen: no tenderness, no masses palpated. No hepatosplenomegaly. Bowel sounds positive.  Musculoskeletal: no clubbing / cyanosis. No joint deformity upper and lower extremities. Good ROM, no contractures.  Normal muscle tone.  Skin: no rashes, lesions, ulcers. No induration Neurologic: Sensation intact. Strength 5/5 in all 4.  Psychiatric: Normal judgment and insight. Alert and oriented x 3. Normal mood.   EKG: Personally reviewed. Sinus rhythm, rate 79, no acute ischemic changes.  Rate is slower when compared to prior.  Assessment/Plan Principal Problem:   Supratherapeutic INR Active Problems:   History of pulmonary embolism   Cerebral venous thrombosis of sigmoid sinus   Seizure-like activity (HCC)   Bipolar disorder (HCC)   BRYTANI VOTH is a 66 y.o. female with medical history significant for syncope/sinus pause s/p PPM, bipolar disorder, depression/anxiety, recent diagnoses of cerebral sinus thrombosis and PE/DVT at OSH who is admitted with supratherapeutic INR in setting of Lovenox and Coumadin use.  Assessment and Plan: * Supratherapeutic INR Right-sided pulmonary emboli LLE DVT Cerebral sinus thrombosis Recent PE, DVT, cerebral sinus thrombosis diagnosed at outside hospital.  She was started on Lovenox and Coumadin and continued on both despite therapeutic INR.  PT >90 and INR >10 on arrival.  She denies any obvious bleeding. -S/p IV vitamin K 2.5 mg -Repeat PT/INR in a.m. -Monitor for signs/symptoms of bleeding -Establish with local hematology for follow-up  Seizure-like activity [facility]) Recent admit at OSH for seizure-like activity.  Started on Depakote 250 mg every 8 hours. -Continue Depakote -Follow up with neurology, previously seen by Dr. Felecia Shelling  Bipolar disorder [facility]) Mood stable on admission.  Continue Risperdal 3 mg qhs, Xanax 1 mg TID prn, amitriptyline 50 mg qhs.  DVT prophylaxis: SCDs Start: 11/07/21 2212 Code Status: Full code Family Communication: Discussed with patient, she has discussed with family Disposition Plan: From home and likely discharge to home pending clinical progress Consults called: None Severity of Illness: The appropriate patient status  for this patient is OBSERVATION. Observation status is judged to be reasonable and necessary in order to provide the required intensity of service to ensure the patient's safety. The patient's presenting symptoms, physical exam findings, and initial radiographic and laboratory data in the context of their medical condition is felt to place them at decreased risk for further clinical deterioration. Furthermore, it is anticipated that the patient will be medically stable for discharge from the hospital within 2 midnights of admission.   Zada Finders MD [facility]  If 7PM-7AM, please contact night-coverage [email or web address]  11/08/2021, 12:00 AM

## 2021-11-07 NOTE — ED Provider Notes (Signed)
[facility] EMERGENCY DEPT Provider Note   CSN: [id number] Arrival date & time: 11/07/21  1548     History  Chief Complaint  Patient presents with   Abnormal Lab    [name] is a 66 y.o. female.   Abnormal Lab Patient has a history of anxiety, chronic smoking, bipolar disorder, polypharmacy, pneumonia, sepsis. Patient states she was recently diagnosed with a pulmonary embolism.  According to the medical records patient was admitted to the hospital, [facility] health on August 7.  According to those records patient presented initially with concerns about possible seizures.  As part of their evaluation for weight loss she ended up having CT scans of her chest abdomen pelvis.  She was noted to have acute right lobar segmental and subsegmental pulmonary embolism with suspected infarct.  She was also noted to have an acute cerebral sinus thrombosis in the left IJ thrombosis in addition to a DVT.  Patient was started on Lovenox.  And started on Coumadin.  Pt has been taking two lovenox shots per day.  She is taking 1 coumadin pill per day.  She went to her doctor today and was told they could not measure her inr because it was too high.  She denies any other complaints.  No blood in her stool.  She does have history of anxiety and feels she is anxious and it is affecting her breathing.  Pt states she used to take xanax and was told to stop taking it.  Home Medications Prior to Admission medications   Medication Sig Start Date End Date Taking? Authorizing Provider  acetaminophen (TYLENOL) 325 MG tablet Take 2 tablets (650 mg total) by mouth every 6 (six) hours as needed for mild pain (or Fever >/= 101). 02/26/20   Domenic Polite, MD  ALPRAZolam Duanne Moron) 1 MG tablet Take 1 tablet (1 mg total) by mouth 3 (three) times daily as needed for anxiety. Must last at least 30 days. 10/17/21   Donnal Moat T, PA-C  amitriptyline (ELAVIL) 25 MG tablet Take 2 tablets (50 mg total) by mouth  at bedtime. 10/27/21   Donnal Moat T, PA-C  [employer] B-12 500 MCG tablet Take 500 mcg by mouth daily. 02/13/21   [provider]  hydrOXYzine (ATARAX) 50 MG tablet TAKE 1-2 TABLETS (50-100 MG TOTAL) BY MOUTH AT BEDTIME AS NEEDED. Patient taking differently: Take 50-100 mg by mouth at bedtime as needed for anxiety. 03/27/21   Addison Lank, PA-C  Oxycodone HCl 10 MG TABS Take 0.5 tablets (5 mg total) by mouth 3 (three) times daily as needed (pain). Patient not taking: Reported on 05/30/2021 03/12/21   Raiford Noble Latif, DO  risperiDONE (RISPERDAL) 3 MG tablet TAKE 1 TABLET BY MOUTH AT BEDTIME. 09/29/21   Hurst, Helene Kelp T, PA-C  Tasimelteon 20 MG CAPS Take 20 mg by mouth at bedtime. Patient not taking: Reported on 10/03/2021 07/25/21   Donnal Moat T, PA-C      Allergies    Antibiotic ear [neomycin-polymyxin-hc] and Penicillins    Review of Systems   Review of Systems  Physical Exam Updated Vital Signs BP (!) 158/89 (BP Location: Right Arm)   Pulse 80   Temp 98.1 F (36.7 C) (Oral)   Resp 18   SpO2 97%  Physical Exam Vitals and nursing note reviewed.  Constitutional:      General: She is not in acute distress.    Appearance: She is well-developed.  HENT:     Head: Normocephalic and atraumatic.  Right Ear: External ear normal.     Left Ear: External ear normal.  Eyes:     General: No scleral icterus.       Right eye: No discharge.        Left eye: No discharge.     Conjunctiva/sclera: Conjunctivae normal.  Neck:     Trachea: No tracheal deviation.  Cardiovascular:     Rate and Rhythm: Normal rate.  Pulmonary:     Effort: Pulmonary effort is normal. Tachypnea present. No respiratory distress.     Breath sounds: No stridor or decreased air movement. No wheezing.  Abdominal:     General: There is no distension.  Musculoskeletal:        General: No swelling or deformity.     Cervical back: Neck supple.  Skin:    General: Skin is warm and dry.     Findings: No rash.   Neurological:     Mental Status: She is alert.     Cranial Nerves: Cranial nerve deficit: no gross deficits.  Psychiatric:        Mood and Affect: Mood is anxious.     ED Results / Procedures / Treatments   Labs (all labs ordered are listed, but only abnormal results are displayed) Labs Reviewed  PROTIME-INR - Abnormal; Notable for the following components:      Result Value   Prothrombin Time >90.0 (*)    INR >10.0 (*)    All other components within normal limits  CBC  BASIC METABOLIC PANEL    EKG None  Radiology DG Chest 2 View  Result Date: 11/07/2021 CLINICAL DATA:  Tachypnea. EXAM: CHEST - 2 VIEW COMPARISON:  11/05/2021 FINDINGS: Normal sized heart. 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 Clear lungs with normal vascularity. Cervical spine fixation hardware. Thoracic spine degenerative changes. IMPRESSION: No acute abnormality. Electronically Signed   By: Claudie Revering M.D.   On: 11/07/2021 17:10    Procedures Procedures    Medications Ordered in ED Medications  phytonadione (VITAMIN K) 2.5 mg in dextrose 5 % 50 mL IVPB (has no administration in time range)  hydrOXYzine (ATARAX) tablet 10 mg (10 mg Oral Given 11/07/21 1709)    ED Course/ Medical Decision Making/ A&P Clinical Course as of 11/07/21 1831  Tue Nov 07, 2021  1641 Outside records reviewed.  Her INR on August 18 with 5.6 [JK]  1739 Protime-INR(!!) Labs show INR is greater than 10 [JK]  1739 CBC Hemoglobin normal [JK]  1739 DG Chest 2 View Chest x-ray without acute findings [JK]  1831 Case discussed with Dr. Posey Pronto regarding admission [JK]    Clinical Course User Index [JK] Dorie Rank, MD                           Medical Decision Making Problems Addressed: History of pulmonary embolism: chronic illness or injury Poisoning by warfarin sodium, accidental or unintentional, initial encounter: acute illness or injury that poses a threat to life or bodily functions  Amount and/or  Complexity of Data Reviewed Labs: ordered. Decision-making details documented in ED Course. Radiology: ordered. Decision-making details documented in ED Course. ECG/medicine tests: ordered.  Risk Prescription drug management. Drug therapy requiring intensive monitoring for toxicity. Decision regarding hospitalization.   Patient presents ED for evaluation of an elevated INR.  Patient was recently at a hospital in other state.  She was diagnosed with cerebral vascular thrombosis, pulmonary embolism and DVT.  Patient was started on  Coumadin and is also been taking Lovenox injections.  Patient has not noticed any bleeding other than some oozing from her lab draws but she was told by her doctor her INR was extremely elevated today.  Our laboratory test confirms this.  Her INR is greater than 10.  Fortunately her CBC and metabolic panel are normal.  I have ordered a dose of vitamin K IV.  Patient is at risk for complications considering her extreme elevation in her INR.  This will also need to be carefully titrated to a therapeutic range and she just recently had blood clots.  I will consult with the medical service for admission        Final Clinical Impression(s) / ED Diagnoses Final diagnoses:  Poisoning by warfarin sodium, accidental or unintentional, initial encounter  History of pulmonary embolism    Rx / DC Orders ED Discharge Orders     None         Dorie Rank, MD 11/07/21 1831

## 2021-11-07 NOTE — Hospital Course (Signed)
[name] is a 66 y.o. female with medical history significant for syncope/sinus pause s/p PPM, bipolar disorder, depression/anxiety, recent diagnoses of cerebral sinus thrombosis and PE/DVT at OSH who is admitted with supratherapeutic INR in setting of Lovenox and Coumadin use.

## 2021-11-07 NOTE — ED Triage Notes (Signed)
Pt sent here after her INR was found to be elevated. Pt states she has had recurrent bleeding from where she was stuck for blood. Pt in NAD.

## 2021-11-07 NOTE — ED Notes (Signed)
Date and time results received: 11/07/21 17:38  Test: INR Critical Value: greater than 10  Name of Provider Notified: Tomi Bamberger  Verbal notification of provider.

## 2021-11-07 NOTE — Assessment & Plan Note (Signed)
Right-sided pulmonary emboli LLE DVT Cerebral sinus thrombosis Recent PE, DVT, cerebral sinus thrombosis diagnosed at outside hospital.  She was started on Lovenox and Coumadin and continued on both despite therapeutic INR.  PT >90 and INR >10 on arrival.  She denies any obvious bleeding. -S/p IV vitamin K 2.5 mg -Repeat PT/INR in a.m. -Monitor for signs/symptoms of bleeding -Establish with local hematology for follow-up

## 2021-11-08 ENCOUNTER — Encounter (HOSPITAL_COMMUNITY): Payer: Self-pay | Admitting: Internal Medicine

## 2021-11-08 DIAGNOSIS — R791 Abnormal coagulation profile: Secondary | ICD-10-CM | POA: Diagnosis not present

## 2021-11-08 LAB — CBC
HCT: 34.1 % — ABNORMAL LOW (ref 36.0–46.0)
Hemoglobin: 11 g/dL — ABNORMAL LOW (ref 12.0–15.0)
MCH: 29.1 pg (ref 26.0–34.0)
MCHC: 32.3 g/dL (ref 30.0–36.0)
MCV: 90.2 fL (ref 80.0–100.0)
Platelets: 195 10*3/uL (ref 150–400)
RBC: 3.78 MIL/uL — ABNORMAL LOW (ref 3.87–5.11)
RDW: 13.9 % (ref 11.5–15.5)
WBC: 7.8 10*3/uL (ref 4.0–10.5)
nRBC: 0 % (ref 0.0–0.2)

## 2021-11-08 LAB — MAGNESIUM: Magnesium: 2 mg/dL (ref 1.7–2.4)

## 2021-11-08 LAB — BASIC METABOLIC PANEL
Anion gap: 8 (ref 5–15)
BUN: 10 mg/dL (ref 8–23)
CO2: 25 mmol/L (ref 22–32)
Calcium: 8.1 mg/dL — ABNORMAL LOW (ref 8.9–10.3)
Chloride: 110 mmol/L (ref 98–111)
Creatinine, Ser: 0.64 mg/dL (ref 0.44–1.00)
GFR, Estimated: >60 mL/min (ref >60)
Glucose, Bld: 92 mg/dL (ref 70–99)
Potassium: 2.8 mmol/L — ABNORMAL LOW (ref 3.5–5.1)
Sodium: 143 mmol/L (ref 135–145)

## 2021-11-08 LAB — PROTIME-INR
INR: 2.1 — ABNORMAL HIGH (ref 0.8–1.2)
Prothrombin Time: 23.8 seconds — ABNORMAL HIGH (ref 11.4–15.2)

## 2021-11-08 MED ORDER — POTASSIUM CHLORIDE CRYS ER 20 MEQ PO TBCR
40.0000 meq | EXTENDED_RELEASE_TABLET | Freq: Three times a day (TID) | ORAL | Status: DC
  Administered 2021-11-08 (×2): 40 meq via ORAL
  Filled 2021-11-08 (×2): qty 2

## 2021-11-08 MED ORDER — WARFARIN SODIUM 4 MG PO TABS
4.0000 mg | ORAL_TABLET | Freq: Once | ORAL | Status: AC
  Administered 2021-11-08: 4 mg via ORAL
  Filled 2021-11-08: qty 1

## 2021-11-08 MED ORDER — POTASSIUM CHLORIDE CRYS ER 20 MEQ PO TBCR
40.0000 meq | EXTENDED_RELEASE_TABLET | Freq: Every day | ORAL | 0 refills | Status: DC
Start: 2021-11-08 — End: 2021-11-14

## 2021-11-08 MED ORDER — WARFARIN - PHARMACIST DOSING INPATIENT: Freq: Every day | Status: DC

## 2021-11-08 NOTE — Assessment & Plan Note (Signed)
Mood stable on admission.  Continue Risperdal 3 mg qhs, Xanax 1 mg TID prn, amitriptyline 50 mg qhs.

## 2021-11-08 NOTE — Discharge Summary (Signed)
Physician Discharge Summary  [name] JGG:[id number] DOB: Aug 05, 1955 DOA: 11/07/2021  PCP: Lujean Amel, MD  Admit date: 11/07/2021 Discharge date: 11/08/2021  Admitted From: Home Disposition: Home  Recommendations for Outpatient Follow-up:  Follow up with PCP in 1 weeks Please obtain INR 8/25, 8/28 and more frequently for next few weeks Strict medication intake as per prescriptions, verify medicine by family   Home Health:NA  Equipment/Devices:NA   Discharge Condition:Stable   CODE STATUS:Full code  Diet recommendation: regular diet   Discharge Summary: 66 YO with extensive medical issues including but not limited to polypharmacy , pace maker insertion, depression, anxiety , recently diagnosed with cerebral venous thrombosis and pulmonary embolism and DVT when she was visiting [place], suspected antiphospholipid syndrome and discharged home on Lovenox and Coumadin.  She had therapeutic INR on discharge.  She was discharged with Lovenox and Coumadin, INR was 5.6 on 8/18.  She went to PCP office on 8/22 and INR was more than 10 and directed to ER.  No bleeding or complications.  Patient was also diagnosed with seizure and started on Depakote. Patient reported she might have been taking double doses of Coumadin than prescribed. Lives at home with her daughter, will need very strict medication monitoring.  She is also on multiple benzodiazepines and narcotics that may add to polypharmacy.  Given 1 dose of vitamin K 2.5 mg IV once, Coumadin dose was held last night.  INR is 2.1.  Patient without any symptoms.  No clinical evidence of bleeding or complications.  Plan: Able to go home today. She will start Coumadin 5 mg daily tonight.  No need for bridging therapy with Lovenox today. Patient was extensively counseled regarding medication administration, avoiding duplication and taking family help and agreeable. Trying to communicate with primary care physician's office to schedule  more frequent INR checks. Called and discussed with [facility] office and requested INR check 8/25  Sent referral to hem onc for follow up . Patient's potassium was 2.8, she was given 40 mill equivalent of potassium replacement before discharge.  We will send 40 meq doses x3 for next 3 days for replacement.  Magnesium is adequate.    Discharge Diagnoses:  Principal Problem:   Supratherapeutic INR Active Problems:   History of pulmonary embolism   Cerebral venous thrombosis of sigmoid sinus   Seizure-like activity ([place])   Bipolar disorder [facility])    Discharge Instructions  Discharge Instructions     Ambulatory referral to Hematology / Oncology   Complete by: As directed    Diet general   Complete by: As directed    Discharge instructions   Complete by: As directed    Recheck INR 8/28 and monitor closely Verify all the medications you take before taking   Increase activity slowly   Complete by: As directed       Allergies as of 11/08/2021       Reactions   Antibiotic Ear [neomycin-polymyxin-hc]    Other reaction(s): Unknown   Penicillins Other (See Comments)   Yeast infection Did it involve swelling of the face/tongue/throat, SOB, or low BP? No Did it involve sudden or severe rash/hives, skin peeling, or any reaction on the inside of your mouth or nose? No Did you need to seek medical attention at a hospital or doctor's office? Yes When did it last happen? More than 5 years ago  If all above answers are "NO", may proceed with cephalosporin use.        Medication List  STOP taking these medications    enoxaparin 60 MG/0.6ML injection Commonly known as: LOVENOX   Tasimelteon 20 MG Caps       TAKE these medications    acetaminophen 325 MG tablet Commonly known as: TYLENOL Take 2 tablets (650 mg total) by mouth every 6 (six) hours as needed for mild pain (or Fever >/= 101).   ALPRAZolam 1 MG tablet Commonly known as: Xanax Take 1 tablet (1 mg total) by  mouth 3 (three) times daily as needed for anxiety. Must last at least 30 days.   amitriptyline 25 MG tablet Commonly known as: ELAVIL Take 2 tablets (50 mg total) by mouth at bedtime.   divalproex 250 MG DR tablet Commonly known as: DEPAKOTE Take 250 mg by mouth in the morning, at noon, and at bedtime.   hydrOXYzine 50 MG tablet Commonly known as: ATARAX TAKE 1-2 TABLETS (50-100 MG TOTAL) BY MOUTH AT BEDTIME AS NEEDED. What changed: reasons to take this   Lemborexant 5 MG Tabs Take 5 mg by mouth at bedtime as needed (sleep).   omeprazole 40 MG capsule Commonly known as: PRILOSEC Take 40 mg by mouth every morning.   ondansetron 8 MG tablet Commonly known as: ZOFRAN Take 4-8 mg by mouth 2 (two) times daily as needed for nausea/vomiting.   Oxycodone HCl 10 MG Tabs Take 1 tablet by mouth 3 (three) times daily as needed for pain.   potassium chloride SA 20 MEQ tablet Commonly known as: KLOR-CON M Take 2 tablets (40 mEq total) by mouth daily for 3 days.   risperiDONE 3 MG tablet Commonly known as: RISPERDAL TAKE 1 TABLET BY MOUTH AT BEDTIME.   warfarin 5 MG tablet Commonly known as: COUMADIN Take 5 mg by mouth daily.        Allergies  Allergen Reactions   Antibiotic Ear [Neomycin-Polymyxin-Hc]     Other reaction(s): Unknown   Penicillins Other (See Comments)    Yeast infection Did it involve swelling of the face/tongue/throat, SOB, or low BP? No Did it involve sudden or severe rash/hives, skin peeling, or any reaction on the inside of your mouth or nose? No Did you need to seek medical attention at a hospital or doctor's office? Yes When did it last happen? More than 5 years ago  If all above answers are "NO", may proceed with cephalosporin use.     Consultations: None   Procedures/Studies: DG Chest 2 View  Result Date: 11/07/2021 CLINICAL DATA:  Tachypnea. EXAM: CHEST - 2 VIEW COMPARISON:  11/05/2021 FINDINGS: Normal sized heart. Stable left subclavian  bipolar pacemaker and leads. Clear lungs with normal vascularity. Cervical spine fixation hardware. Thoracic spine degenerative changes. IMPRESSION: No acute abnormality. Electronically Signed   By: Claudie Revering M.D.   On: 11/07/2021 17:10   (Echo, Carotid, EGD, Colonoscopy, ERCP)    Subjective: Patient seen and examined.  Denies any complaints.  She tells me she is fine.  She tells me she is independent.  She understands Coumadin instructions.    Discharge Exam: Vitals:   11/08/21 0928 11/08/21 0943  BP:  128/87  Pulse:  91  Resp: (!) 22 16  Temp:  98.4 F (36.9 C)  SpO2:  98%   Vitals:   11/08/21 0203 11/08/21 0551 11/08/21 0928 11/08/21 0943  BP: 125/84 112/72  128/87  Pulse: 81 86  91  Resp: 20 20 (!) 22 16  Temp: 98.9 F (37.2 C) 98.2 F (36.8 C)  98.4 F (36.9 C)  TempSrc: Oral  Oral  Oral  SpO2: 99% 98%  98%  Weight:      Height:        General: Pt is alert, awake, not in acute distress Flat affect. Cardiovascular: RRR, S1/S2 +, no rubs, no gallops, permanent pacemaker in place. Respiratory: CTA bilaterally, no wheezing, no rhonchi Abdominal: Soft, NT, ND, bowel sounds + Extremities: no edema, no cyanosis    The results of significant diagnostics from this hospitalization (including imaging, microbiology, ancillary and laboratory) are listed below for reference.     Microbiology: No results found for this or any previous visit (from the past 240 hour(s)).   Labs: BNP (last 3 results) No results for input(s): "BNP" in the last 8760 hours. Basic Metabolic Panel: Recent Labs  Lab 11/07/21 1647 11/08/21 0411  NA 145 143  K 3.5 2.8*  CL 108 110  CO2 25 25  GLUCOSE 91 92  BUN 9 10  CREATININE 0.75 0.64  CALCIUM 8.9 8.1*  MG  --  2.0   Liver Function Tests: No results for input(s): "AST", "ALT", "ALKPHOS", "BILITOT", "PROT", "ALBUMIN" in the last 168 hours. No results for input(s): "LIPASE", "AMYLASE" in the last 168 hours. No results for input(s):  "AMMONIA" in the last 168 hours. CBC: Recent Labs  Lab 11/07/21 1647 11/08/21 0411  WBC 10.5 7.8  HGB 12.3 11.0*  HCT 37.7 34.1*  MCV 86.7 90.2  PLT 254 195   Cardiac Enzymes: No results for input(s): "CKTOTAL", "CKMB", "CKMBINDEX", "TROPONINI" in the last 168 hours. BNP: Invalid input(s): "POCBNP" CBG: No results for input(s): "GLUCAP" in the last 168 hours. D-Dimer No results for input(s): "DDIMER" in the last 72 hours. Hgb A1c No results for input(s): "HGBA1C" in the last 72 hours. Lipid Profile No results for input(s): "CHOL", "HDL", "LDLCALC", "TRIG", "CHOLHDL", "LDLDIRECT" in the last 72 hours. Thyroid function studies No results for input(s): "TSH", "T4TOTAL", "T3FREE", "THYROIDAB" in the last 72 hours.  Invalid input(s): "FREET3" Anemia work up No results for input(s): "VITAMINB12", "FOLATE", "FERRITIN", "TIBC", "IRON", "RETICCTPCT" in the last 72 hours. Urinalysis    Component Value Date/Time   COLORURINE YELLOW 05/16/2021 [place] 05/16/2021 1854   LABSPEC 1.021 05/16/2021 1854   PHURINE 5.0 05/16/2021 1854   GLUCOSEU NEGATIVE 05/16/2021 1854   HGBUR NEGATIVE 05/16/2021 [place] 05/16/2021 1854   KETONESUR 5 (A) 05/16/2021 1854   PROTEINUR 30 (A) 05/16/2021 1854   NITRITE NEGATIVE 05/16/2021 1854   LEUKOCYTESUR NEGATIVE 05/16/2021 1854   Sepsis Labs Recent Labs  Lab 11/07/21 1647 11/08/21 0411  WBC 10.5 7.8   Microbiology No results found for this or any previous visit (from the past 240 hour(s)).   Time coordinating discharge:  35 minutes  SIGNED:   Barb Merino, MD  [facility] 11/08/2021, 11:05 AM

## 2021-11-08 NOTE — TOC Initial Note (Signed)
Transition of Care [facility]) - Initial/Assessment Note    Patient Details  Name: [name] MRN: [id number] Date of Birth: 1955/04/11  Transition of Care [facility]) CM/SW Contact:    Leeroy Cha, RN Phone Number: 11/08/2021, 7:55 AM  Clinical Narrative:                  Transition of Care [facility]) Screening Note   Patient Details  Name: [name] Date of Birth: [birth year]   Transition of Care [facility]) CM/SW Contact:    Leeroy Cha, RN Phone Number: 11/08/2021, 7:55 AM    Transition of Care Department [facility]) has reviewed patient and no TOC needs have been identified at this time. We will continue to monitor patient advancement through interdisciplinary progression rounds. If new patient transition needs arise, please place a TOC consult.    Expected Discharge Plan: Home/Self Care Barriers to Discharge: Continued Medical Work up   Patient Goals and CMS Choice Patient states their goals for this hospitalization and ongoing recovery are:: to go home CMS Medicare.gov Compare Post Acute Care list provided to:: Patient Choice offered to / list presented to : Patient  Expected Discharge Plan and Services Expected Discharge Plan: Home/Self Care   Discharge Planning Services: CM Consult   Living arrangements for the past 2 months: Apartment                                      Prior Living Arrangements/Services Living arrangements for the past 2 months: Apartment Lives with:: Self Patient language and need for interpreter reviewed:: Yes Do you feel safe going back to the place where you live?: Yes            Criminal Activity/Legal Involvement Pertinent to Current Situation/Hospitalization: No - Comment as needed  Activities of Daily Living Home Assistive Devices/Equipment: Dentures (specify type), Eyeglasses ADL Screening (condition at time of admission) Patient's cognitive ability adequate to safely complete daily activities?: Yes Is the patient  deaf or have difficulty hearing?: No Does the patient have difficulty seeing, even when wearing glasses/contacts?: No Does the patient have difficulty concentrating, remembering, or making decisions?: Yes Patient able to express need for assistance with ADLs?: Yes Does the patient have difficulty dressing or bathing?: No Independently performs ADLs?: Yes (appropriate for developmental age) Does the patient have difficulty walking or climbing stairs?: No Weakness of Legs: None Weakness of Arms/Hands: None  Permission Sought/Granted                  Emotional Assessment Appearance:: Appears stated age Attitude/Demeanor/Rapport: Engaged Affect (typically observed): Calm Orientation: : Oriented to Self, Oriented to Place, Oriented to  Time, Oriented to Situation Alcohol / Substance Use: Tobacco Use Psych Involvement: No (comment)  Admission diagnosis:  History of pulmonary embolism [Z86.711] Supratherapeutic INR [R79.1] Poisoning by warfarin sodium, accidental or unintentional, initial encounter [T45.511A] Patient Active Problem List   Diagnosis Date Noted   Supratherapeutic INR 11/07/2021   History of pulmonary embolism 11/07/2021   Cerebral venous thrombosis of sigmoid sinus 11/07/2021   Seizure-like activity ([place]) 11/07/2021   Electrolyte abnormality 05/04/2021   Altered mental status 05/04/2021   fall with rhabdomyloysis  05/03/2021   Leukocytosis 05/03/2021   Chronic pain 05/03/2021   Underweight 05/03/2021   Lactic acidosis 78/29/5621   Acute metabolic encephalopathy 30/86/5784   Degeneration of lumbar intervertebral disc 08/16/2020   Sepsis secondary to UTI ([place])  04/17/2020   Hypoglycemia without diagnosis of diabetes mellitus 04/17/2020   Sepsis due to pneumonia ([place]) 04/17/2020   Physical deconditioning    Myositis 03/03/2020   HCAP (healthcare-associated pneumonia) 02/21/2020   Generalized weakness 02/21/2020   GAD (generalized anxiety disorder) 02/21/2020    Proximal muscle weakness 02/08/2020   Dysphagia 02/08/2020   Gait disturbance 01/04/2020   Elevated CK 01/04/2020   Weakness of both lower extremities 01/04/2020   Encephalopathy    Polypharmacy    Fall 12/18/2019   Hypokalemia    Abnormal liver function test    Cervical post-laminectomy syndrome 10/21/2019   Cervical spondylosis 09/23/2019   Memory loss 07/02/2019   Depression with anxiety 07/02/2019   B12 deficiency 07/02/2019   Lumbar radiculopathy 02/10/2019   History of fusion of lumbar spine 02/10/2019   Dysesthesia 02/10/2019   Retroperitoneal fibrosis    Pelvic adhesions    Left ovarian cyst 11/17/2018   Elevated tumor markers 11/17/2018   Dyspnea 09/15/2018   Laryngopharyngeal reflux (LPR) 05/19/2018   Bipolar disorder ([place]) 04/16/2018   Insomnia 04/16/2018   Attention deficit hyperactivity disorder (ADHD) 04/16/2018   Pain in right knee 04/04/2017   Neck pain on right side 09/09/2014   Pseudoarthrosis of lumbar spine 04/15/2014   Family history of arrhythmogenic right ventricular cardiomyopathy 03/30/2013   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 02/20/2013   Sinus pause 02/14/2013   Hx of syncope- s/p Loop recorder 11/06/12 10/29/2012   Vertigo 10/29/2012   Tobacco abuse 10/29/2012   ANXIETY 11/14/2009   BENIGN POSITIONAL VERTIGO 11/14/2009   EAR PAIN, RIGHT 11/14/2009   SCIATICA 05/09/2009   PCP:  Lujean Amel, MD Pharmacy:   [id number] [address] [place]Phone: [phone]: [phone]    Social Determinants of Health (SDOH) Interventions    Readmission Risk Interventions   No data to display

## 2021-11-08 NOTE — TOC Transition Note (Signed)
Transition of Care [facility]) - CM/SW Discharge Note   Patient Details  Name: MAKYNA NIEHOFF MRN: [id number] Date of Birth: Aug 06, 1955  Transition of Care [facility]) CM/SW Contact:  Leeroy Cha, RN Phone Number: 11/08/2021, 12:25 PM   Clinical Narrative:    Discharged to return home with self care no toc needs present.   Final next level of care: Home/Self Care Barriers to Discharge: Barriers Resolved   Patient Goals and CMS Choice Patient states their goals for this hospitalization and ongoing recovery are:: to go home CMS Medicare.gov Compare Post Acute Care list provided to:: Patient Choice offered to / list presented to : Patient  Discharge Placement                       Discharge Plan and Services   Discharge Planning Services: CM Consult                                 Social Determinants of Health (SDOH) Interventions     Readmission Risk Interventions   No data to display

## 2021-11-08 NOTE — Progress Notes (Signed)
ANTICOAGULATION CONSULT NOTE - Follow Up Consult  Pharmacy Consult for Coumadin Indication:  DVT, PE, and dural venous sinus thrombosis   Allergies  Allergen Reactions   Antibiotic Ear [Neomycin-Polymyxin-Hc]     Other reaction(s): Unknown   Penicillins Other (See Comments)    Yeast infection Did it involve swelling of the face/tongue/throat, SOB, or low BP? No Did it involve sudden or severe rash/hives, skin peeling, or any reaction on the inside of your mouth or nose? No Did you need to seek medical attention at a hospital or doctor's office? Yes When did it last happen? More than 5 years ago  If all above answers are "NO", may proceed with cephalosporin use.     Patient Measurements: Height: '5\' 7"'$  (170.2 cm) Weight: 42.6 kg (93 lb 14.7 oz) IBW/kg (Calculated) : 61.6  Vital Signs: Temp: 98.2 F (36.8 C) (08/23 0551) Temp Source: Oral (08/23 0551) BP: 112/72 (08/23 0551) Pulse Rate: 86 (08/23 0551)  Labs: Recent Labs    11/07/21 1647 11/08/21 0411  HGB 12.3 11.0*  HCT 37.7 34.1*  PLT 254 195  LABPROT >90.0* 23.8*  INR >10.0* 2.1*  CREATININE 0.75 0.64    Estimated Creatinine Clearance: 47.8 mL/min (by C-G formula based on SCr of 0.64 mg/dL).  Assessment:  Active Problem(s): INR>10  PMH:  syncope/sinus pause s/p PPM, bipolar disorder, depression/anxiety, recent diagnoses of cerebral sinus thrombosis and PE/DVT at OSH  - anxiety, depression, Bipolar, cervical cancer, HA, PPM, syncope 2014, tobacco, vertigo  Significant events: recently admitted to [facility] in [place], [place] while she was visiting.  She was admitted 10/23/2021-11/01/2021. - seizure like activity and started on Depakote, Continuous video EEG showed bilateral and focal cerebral dysfunction and focal cortical irritability - was concern for antiphospholipid syndrome which appears to be the reason why Coumadin was chosen.  AC/Heme: OSH in [place] 10/2021: lobar, segmental, subsegmental PE  within the right lung; LLE DVT; as well as nonocclusive dural venous sinus thrombosis involving the left transverse sinus and sigmoid sinus extending into the proximal left internal jugular vein>>LMWH/Coumadin.  INR was 2.65 on 8/16 on day of discharge.  Despite this she was prescribed Lovenox plus Coumadin at discharge. F/U with the hematology clinic on 8/18 repeat INR was 5.6.  The clinic was unable to reach patient or her daughter with the lab result.   Patient states that she has been using both Lovenox and Coumadin and accidentally taking Coumadin twice a day for a few days until she realized her mistake and decreased back to once daily. - PTA dose: TBD (filled '5mg'$  tabs)  - 8/22: INR>10 given Vit K 2.'5mg'$  IV - 8/23: INR 2.1, Hgb 11, Plts 195   Goal of Therapy:  INR 2-3 Monitor platelets by anticoagulation protocol: Yes   Plan:  Coumadin '4mg'$  po x 1 tonight May take higher doses to overcome the Vit K Daily INR.   [name] S. Alford Highland, PharmD, BCPS Clinical Staff Pharmacist [email or web address]  Alford Highland, Vivion Romano Stillinger 11/08/2021,9:14 AM

## 2021-11-10 ENCOUNTER — Inpatient Hospital Stay (HOSPITAL_COMMUNITY)
Admission: EM | Admit: 2021-11-10 | Discharge: 2021-11-14 | DRG: 813 | Disposition: A | Discharge status: Home / Self Care | Payer: Medicare Other | Attending: Internal Medicine | Admitting: Internal Medicine

## 2021-11-10 ENCOUNTER — Other Ambulatory Visit: Payer: Self-pay

## 2021-11-10 ENCOUNTER — Encounter (HOSPITAL_COMMUNITY): Payer: Self-pay

## 2021-11-10 DIAGNOSIS — Z8249 Family history of ischemic heart disease and other diseases of the circulatory system: Secondary | ICD-10-CM | POA: Diagnosis not present

## 2021-11-10 DIAGNOSIS — Z950 Presence of cardiac pacemaker: Secondary | ICD-10-CM

## 2021-11-10 DIAGNOSIS — Z86711 Personal history of pulmonary embolism: Secondary | ICD-10-CM

## 2021-11-10 DIAGNOSIS — T45515A Adverse effect of anticoagulants, initial encounter: Secondary | ICD-10-CM | POA: Diagnosis not present

## 2021-11-10 DIAGNOSIS — Z79899 Other long term (current) drug therapy: Secondary | ICD-10-CM

## 2021-11-10 DIAGNOSIS — Z881 Allergy status to other antibiotic agents status: Secondary | ICD-10-CM

## 2021-11-10 DIAGNOSIS — R310 Gross hematuria: Secondary | ICD-10-CM | POA: Diagnosis not present

## 2021-11-10 DIAGNOSIS — K219 Gastro-esophageal reflux disease without esophagitis: Secondary | ICD-10-CM | POA: Diagnosis not present

## 2021-11-10 DIAGNOSIS — Z86718 Personal history of other venous thrombosis and embolism: Secondary | ICD-10-CM | POA: Diagnosis not present

## 2021-11-10 DIAGNOSIS — F17210 Nicotine dependence, cigarettes, uncomplicated: Secondary | ICD-10-CM | POA: Diagnosis present

## 2021-11-10 DIAGNOSIS — F419 Anxiety disorder, unspecified: Secondary | ICD-10-CM | POA: Diagnosis present

## 2021-11-10 DIAGNOSIS — F909 Attention-deficit hyperactivity disorder, unspecified type: Secondary | ICD-10-CM | POA: Diagnosis present

## 2021-11-10 DIAGNOSIS — Z880 Allergy status to penicillin: Secondary | ICD-10-CM | POA: Diagnosis not present

## 2021-11-10 DIAGNOSIS — K8020 Calculus of gallbladder without cholecystitis without obstruction: Secondary | ICD-10-CM | POA: Diagnosis present

## 2021-11-10 DIAGNOSIS — R791 Abnormal coagulation profile: Secondary | ICD-10-CM | POA: Diagnosis present

## 2021-11-10 DIAGNOSIS — Z8541 Personal history of malignant neoplasm of cervix uteri: Secondary | ICD-10-CM

## 2021-11-10 DIAGNOSIS — D6832 Hemorrhagic disorder due to extrinsic circulating anticoagulants: Secondary | ICD-10-CM | POA: Diagnosis not present

## 2021-11-10 DIAGNOSIS — N3001 Acute cystitis with hematuria: Secondary | ICD-10-CM | POA: Diagnosis not present

## 2021-11-10 DIAGNOSIS — I700 Atherosclerosis of aorta: Secondary | ICD-10-CM | POA: Diagnosis not present

## 2021-11-10 DIAGNOSIS — Z981 Arthrodesis status: Secondary | ICD-10-CM | POA: Diagnosis not present

## 2021-11-10 DIAGNOSIS — F319 Bipolar disorder, unspecified: Secondary | ICD-10-CM | POA: Diagnosis present

## 2021-11-10 DIAGNOSIS — N390 Urinary tract infection, site not specified: Secondary | ICD-10-CM | POA: Diagnosis present

## 2021-11-10 DIAGNOSIS — R54 Age-related physical debility: Secondary | ICD-10-CM | POA: Diagnosis present

## 2021-11-10 DIAGNOSIS — T45511A Poisoning by anticoagulants, accidental (unintentional), initial encounter: Secondary | ICD-10-CM

## 2021-11-10 DIAGNOSIS — E785 Hyperlipidemia, unspecified: Secondary | ICD-10-CM | POA: Diagnosis present

## 2021-11-10 DIAGNOSIS — Z681 Body mass index (BMI) 19 or less, adult: Secondary | ICD-10-CM

## 2021-11-10 DIAGNOSIS — Z87440 Personal history of urinary (tract) infections: Secondary | ICD-10-CM

## 2021-11-10 DIAGNOSIS — Z7901 Long term (current) use of anticoagulants: Secondary | ICD-10-CM | POA: Diagnosis not present

## 2021-11-10 DIAGNOSIS — R636 Underweight: Secondary | ICD-10-CM | POA: Diagnosis present

## 2021-11-10 DIAGNOSIS — E876 Hypokalemia: Secondary | ICD-10-CM | POA: Diagnosis present

## 2021-11-10 DIAGNOSIS — Z720 Tobacco use: Secondary | ICD-10-CM | POA: Diagnosis present

## 2021-11-10 DIAGNOSIS — B961 Klebsiella pneumoniae [K. pneumoniae] as the cause of diseases classified elsewhere: Secondary | ICD-10-CM | POA: Diagnosis present

## 2021-11-10 DIAGNOSIS — Z5181 Encounter for therapeutic drug level monitoring: Secondary | ICD-10-CM | POA: Diagnosis not present

## 2021-11-10 DIAGNOSIS — N200 Calculus of kidney: Secondary | ICD-10-CM | POA: Diagnosis present

## 2021-11-10 DIAGNOSIS — F418 Other specified anxiety disorders: Secondary | ICD-10-CM | POA: Diagnosis present

## 2021-11-10 LAB — COMPREHENSIVE METABOLIC PANEL
ALT: 9 U/L (ref 0–44)
AST: 11 U/L — ABNORMAL LOW (ref 15–41)
Albumin: 3.7 g/dL (ref 3.5–5.0)
Alkaline Phosphatase: 85 U/L (ref 38–126)
Anion gap: 9 (ref 5–15)
BUN: 12 mg/dL (ref 8–23)
CO2: 25 mmol/L (ref 22–32)
Calcium: 8.8 mg/dL — ABNORMAL LOW (ref 8.9–10.3)
Chloride: 110 mmol/L (ref 98–111)
Creatinine, Ser: 0.79 mg/dL (ref 0.44–1.00)
GFR, Estimated: >60 mL/min (ref >60)
Glucose, Bld: 87 mg/dL (ref 70–99)
Potassium: 3 mmol/L — ABNORMAL LOW (ref 3.5–5.1)
Sodium: 144 mmol/L (ref 135–145)
Total Bilirubin: 0.4 mg/dL (ref 0.3–1.2)
Total Protein: 6.7 g/dL (ref 6.5–8.1)

## 2021-11-10 LAB — CBC WITH DIFFERENTIAL/PLATELET
Abs Immature Granulocytes: 0.06 10*3/uL (ref 0.00–0.07)
Basophils Absolute: 0 10*3/uL (ref 0.0–0.1)
Basophils Relative: 0 %
Eosinophils Absolute: 0.1 10*3/uL (ref 0.0–0.5)
Eosinophils Relative: 1 %
HCT: 38.2 % (ref 36.0–46.0)
Hemoglobin: 12 g/dL (ref 12.0–15.0)
Immature Granulocytes: 1 %
Lymphocytes Relative: 23 %
Lymphs Abs: 2.8 10*3/uL (ref 0.7–4.0)
MCH: 28.6 pg (ref 26.0–34.0)
MCHC: 31.4 g/dL (ref 30.0–36.0)
MCV: 91.2 fL (ref 80.0–100.0)
Monocytes Absolute: 1.4 10*3/uL — ABNORMAL HIGH (ref 0.1–1.0)
Monocytes Relative: 11 %
Neutro Abs: 7.8 10*3/uL — ABNORMAL HIGH (ref 1.7–7.7)
Neutrophils Relative %: 64 %
Platelets: 232 10*3/uL (ref 150–400)
RBC: 4.19 MIL/uL (ref 3.87–5.11)
RDW: 14.4 % (ref 11.5–15.5)
WBC: 12.2 10*3/uL — ABNORMAL HIGH (ref 4.0–10.5)
nRBC: 0 % (ref 0.0–0.2)

## 2021-11-10 LAB — PROTIME-INR
INR: 11.3 (ref 0.8–1.2)
Prothrombin Time: 87 seconds — ABNORMAL HIGH (ref 11.4–15.2)

## 2021-11-10 NOTE — ED Provider Triage Note (Cosign Needed Addendum)
Emergency Medicine Provider Triage Evaluation Note  [name] , a 66 y.o. female  was evaluated in triage.  Pt complains of hematuria. States that same began tonight with 1 episode of same just before she got here that was bright red blood in the toilet without clots. Was recently started on Warfarin for PE and was just discharged after being admitted for supratherapeutic INR after there was a miscommunication about how much warfarin she was supposed to be taking. She states that since she was discharged she has been only taking 1 warfarin a day as prescribed. Denies any fevers, chills, chest pain, shortness of breath, nausea, vomiting, diarrhea, abdominal pain.  Review of Systems  Positive:  Negative:   Physical Exam  BP 128/74 (BP Location: Left Arm)   Pulse (!) 114   Temp 99 F (37.2 C) (Oral)   Resp 16   Ht '5\' 7"'$  (1.702 m)   Wt 45.8 kg   SpO2 100%   BMI 15.82 kg/m  Gen:   Awake, no distress   Resp:  Normal effort  MSK:   Moves extremities without difficulty  Other:    Medical Decision Making  Medically screening exam initiated at 11:03 PM.  Appropriate orders placed.  [name] was informed that the remainder of the evaluation will be completed by another provider, this initial triage assessment does not replace that evaluation, and the importance of remaining in the ED until their evaluation is complete.     Bud Face, PA-C 11/10/21 2305    Bud Face, PA-C 11/10/21 2306

## 2021-11-10 NOTE — ED Triage Notes (Addendum)
Reports blood in her urine today.   Also sts she has been taking Warfarin BID for multiple blood clots but was only supposed to be taking once a day.

## 2021-11-11 ENCOUNTER — Encounter (HOSPITAL_COMMUNITY): Payer: Self-pay | Admitting: Internal Medicine

## 2021-11-11 ENCOUNTER — Inpatient Hospital Stay (HOSPITAL_COMMUNITY): Payer: Medicare Other

## 2021-11-11 DIAGNOSIS — K8020 Calculus of gallbladder without cholecystitis without obstruction: Secondary | ICD-10-CM | POA: Diagnosis not present

## 2021-11-11 DIAGNOSIS — I700 Atherosclerosis of aorta: Secondary | ICD-10-CM | POA: Diagnosis present

## 2021-11-11 DIAGNOSIS — R310 Gross hematuria: Secondary | ICD-10-CM | POA: Diagnosis not present

## 2021-11-11 DIAGNOSIS — N200 Calculus of kidney: Secondary | ICD-10-CM | POA: Diagnosis not present

## 2021-11-11 DIAGNOSIS — E785 Hyperlipidemia, unspecified: Secondary | ICD-10-CM | POA: Diagnosis present

## 2021-11-11 HISTORY — DX: Hyperlipidemia, unspecified: E78.5

## 2021-11-11 HISTORY — DX: Calculus of gallbladder without cholecystitis without obstruction: K80.20

## 2021-11-11 LAB — URINALYSIS, ROUTINE W REFLEX MICROSCOPIC
Bilirubin Urine: NEGATIVE
Glucose, UA: NEGATIVE mg/dL
Ketones, ur: 5 mg/dL — AB
Nitrite: POSITIVE — AB
Protein, ur: 100 mg/dL — AB
RBC / HPF: >50 RBC/hpf — ABNORMAL HIGH (ref 0–5)
Specific Gravity, Urine: 1.018 (ref 1.005–1.030)
pH: 5 (ref 5.0–8.0)

## 2021-11-11 LAB — CBC
HCT: 33.1 % — ABNORMAL LOW (ref 36.0–46.0)
Hemoglobin: 10.7 g/dL — ABNORMAL LOW (ref 12.0–15.0)
MCH: 29 pg (ref 26.0–34.0)
MCHC: 32.3 g/dL (ref 30.0–36.0)
MCV: 89.7 fL (ref 80.0–100.0)
Platelets: 191 10*3/uL (ref 150–400)
RBC: 3.69 MIL/uL — ABNORMAL LOW (ref 3.87–5.11)
RDW: 14.2 % (ref 11.5–15.5)
WBC: 8.8 10*3/uL (ref 4.0–10.5)
nRBC: 0 % (ref 0.0–0.2)

## 2021-11-11 LAB — BASIC METABOLIC PANEL
Anion gap: 8 (ref 5–15)
BUN: 10 mg/dL (ref 8–23)
CO2: 24 mmol/L (ref 22–32)
Calcium: 8 mg/dL — ABNORMAL LOW (ref 8.9–10.3)
Chloride: 109 mmol/L (ref 98–111)
Creatinine, Ser: 0.67 mg/dL (ref 0.44–1.00)
GFR, Estimated: >60 mL/min (ref >60)
Glucose, Bld: 87 mg/dL (ref 70–99)
Potassium: 2.9 mmol/L — ABNORMAL LOW (ref 3.5–5.1)
Sodium: 141 mmol/L (ref 135–145)

## 2021-11-11 LAB — MAGNESIUM: Magnesium: 1.8 mg/dL (ref 1.7–2.4)

## 2021-11-11 LAB — PROTIME-INR
INR: 2.6 — ABNORMAL HIGH (ref 0.8–1.2)
Prothrombin Time: 27.9 seconds — ABNORMAL HIGH (ref 11.4–15.2)

## 2021-11-11 MED ORDER — MAGNESIUM SULFATE 2 GM/50ML IV SOLN
2.0000 g | Freq: Once | INTRAVENOUS | Status: AC
Start: 2021-11-11 — End: 2021-11-11
  Administered 2021-11-11: 2 g via INTRAVENOUS
  Filled 2021-11-11: qty 50

## 2021-11-11 MED ORDER — RISPERIDONE 1 MG PO TABS
3.0000 mg | ORAL_TABLET | Freq: Every day | ORAL | Status: DC
  Administered 2021-11-11 – 2021-11-13 (×3): 3 mg via ORAL
  Filled 2021-11-11 (×3): qty 3

## 2021-11-11 MED ORDER — ONDANSETRON HCL 4 MG/2ML IJ SOLN
4.0000 mg | Freq: Four times a day (QID) | INTRAMUSCULAR | Status: DC | PRN
  Administered 2021-11-13: 4 mg via INTRAVENOUS
  Filled 2021-11-11: qty 2

## 2021-11-11 MED ORDER — PANTOPRAZOLE SODIUM 40 MG PO TBEC
40.0000 mg | DELAYED_RELEASE_TABLET | Freq: Every day | ORAL | Status: DC
  Administered 2021-11-11 – 2021-11-14 (×4): 40 mg via ORAL
  Filled 2021-11-11 (×4): qty 1

## 2021-11-11 MED ORDER — VITAMIN K1 10 MG/ML IJ SOLN
2.5000 mg | Freq: Once | INTRAVENOUS | Status: AC
  Administered 2021-11-11: 2.5 mg via INTRAVENOUS
  Filled 2021-11-11: qty 0.25

## 2021-11-11 MED ORDER — DIVALPROEX SODIUM 250 MG PO DR TAB
250.0000 mg | DELAYED_RELEASE_TABLET | Freq: Three times a day (TID) | ORAL | Status: DC
  Administered 2021-11-11 – 2021-11-14 (×10): 250 mg via ORAL
  Filled 2021-11-11 (×10): qty 1

## 2021-11-11 MED ORDER — IOHEXOL 300 MG/ML  SOLN
125.0000 mL | Freq: Once | INTRAMUSCULAR | Status: AC | PRN
  Administered 2021-11-11: 125 mL via INTRAVENOUS

## 2021-11-11 MED ORDER — SODIUM CHLORIDE (PF) 0.9 % IJ SOLN
INTRAMUSCULAR | Status: AC
  Filled 2021-11-11: qty 50

## 2021-11-11 MED ORDER — ONDANSETRON HCL 4 MG PO TABS: 4.0000 mg | ORAL_TABLET | Freq: Four times a day (QID) | ORAL | Status: DC | PRN

## 2021-11-11 MED ORDER — SODIUM CHLORIDE 0.9 % IV SOLN
INTRAVENOUS | Status: AC
  Filled 2021-11-11: qty 250

## 2021-11-11 MED ORDER — SODIUM CHLORIDE 0.9 % IV SOLN
1.0000 g | Freq: Once | INTRAVENOUS | Status: AC
  Administered 2021-11-11: 1 g via INTRAVENOUS
  Filled 2021-11-11: qty 10

## 2021-11-11 MED ORDER — ACETAMINOPHEN 650 MG RE SUPP: 650.0000 mg | Freq: Four times a day (QID) | RECTAL | Status: DC | PRN

## 2021-11-11 MED ORDER — SODIUM CHLORIDE 0.9 % IV SOLN
1.0000 g | Freq: Every day | INTRAVENOUS | Status: DC
  Administered 2021-11-12 – 2021-11-13 (×2): 1 g via INTRAVENOUS
  Filled 2021-11-11 (×3): qty 10

## 2021-11-11 MED ORDER — ALPRAZOLAM 0.5 MG PO TABS
0.5000 mg | ORAL_TABLET | Freq: Every day | ORAL | Status: DC | PRN
  Administered 2021-11-11 – 2021-11-13 (×3): 0.5 mg via ORAL
  Filled 2021-11-11 (×3): qty 1

## 2021-11-11 MED ORDER — SODIUM CHLORIDE 0.9 % IV BOLUS
500.0000 mL | Freq: Once | INTRAVENOUS | Status: AC
  Administered 2021-11-11: 500 mL via INTRAVENOUS

## 2021-11-11 MED ORDER — NICOTINE 14 MG/24HR TD PT24: 14.0000 mg | MEDICATED_PATCH | Freq: Every day | TRANSDERMAL | Status: DC | PRN

## 2021-11-11 MED ORDER — POTASSIUM CHLORIDE IN NACL 20-0.45 MEQ/L-% IV SOLN
INTRAVENOUS | Status: AC
  Filled 2021-11-11 (×2): qty 1000

## 2021-11-11 MED ORDER — POTASSIUM CHLORIDE CRYS ER 20 MEQ PO TBCR
40.0000 meq | EXTENDED_RELEASE_TABLET | Freq: Once | ORAL | Status: AC
  Administered 2021-11-11: 40 meq via ORAL
  Filled 2021-11-11: qty 2

## 2021-11-11 MED ORDER — ACETAMINOPHEN 325 MG PO TABS: 650.0000 mg | ORAL_TABLET | Freq: Four times a day (QID) | ORAL | Status: DC | PRN

## 2021-11-11 NOTE — H&P (Signed)
History and Physical    Patient: [name] DOB: 1956/01/21 DOA: 11/10/2021 DOS: the patient was seen and examined on 11/11/2021 PCP: Lujean Amel, MD  Patient coming from: Home  Chief Complaint:  Chief Complaint  Patient presents with   Abnormal Lab   Hematuria   HPI: [name] is a 66 y.o. female with medical history significant of anxiety, depression, bipolar disorder, ADHD, headaches, cerebral venous thrombosis of sigmoid sinus, PE, DVT of left peroneal vein, left jugular vein thrombosis, cervical spondylosis, degeneration of the lumbar intervertebral disc, lumbar spine fusion, dysphagia, dysphagia, history of rhabdomyolysis, history of healthcare associated pneumonia, sepsis due to pneumonia, hypoglycemia laryngopharyngeal reflux left ovarian cyst, memory loss, myositis, right knee pain, lower extremity weakness, retroperitoneal fibrosis sciatica, seizure-like activity, sinus pauses, history of pacemaker placement who was recently admitted and discharged for supratherapeutic INR and is returning to the emergency department due to hematuria.  She stated she has been taking the warfarin only once a day now.  No fever, chills or night sweats. No sore throat, rhinorrhea, dyspnea, wheezing or hemoptysis.  No chest pain, palpitations, diaphoresis, PND, orthopnea or pitting edema of the lower extremities.  No appetite changes, abdominal pain, diarrhea, constipation, melena or hematochezia.  No flank pain, dysuria, frequency or hematuria.  No polyuria, polydipsia, polyphagia or blurred vision.  He denies cervical, thoracic or lumbar spine tenderness at this time.  She is no longer taking oxycodone.  ED course: Initial vital signs were temperature 99 F, pulse 114, respirations 16, BP 120/74 mmHg O2 sat 100% on room air.  The patient received 500 mL normal saline bolus, ceftriaxone 1 g IVPB and 2.5 mg of vitamin K IVPB.  Lab work: Her urinalysis was brown in color, cloudy with  moderate hemoglobinuria, ketonuria 5 and proteinuria 100 mg/dL, positive nitrites and trace leukocyte esterase.  Microscopic examination showed more than 50 RBC, 0-5 WBC and few bacteria.  CBC showed a white count 12.2, hemoglobin 12.0 g/dL platelets 232.  PT was 11.3 and INR 87.0.  CMP showed a potassium of 3.0 mmol/L, but all other values were normal after calcium correction.   Review of Systems: As mentioned in the history of present illness. All other systems reviewed and are negative. Past Medical History:  Diagnosis Date   Anxiety    anxiety and mild depressive order systoms   Bipolar disorder ([place])     (02/20/2013)   Cervical cancer ([place]) 03/19/1980   Depression    Dysrhythmia    Headache(784.0)    "monthly" (02/20/2013)   Pacemaker    medtronic   PONV (postoperative nausea and vomiting)    Sinus pause 02/14/2013   Syncope and collapse    echo-April 12,2012-EF 55% Echo normal; recorder with prolonged sinus pauses --> status post   Tobacco abuse    Vertigo    Past Surgical History:  Procedure Laterality Date   ABDOMINAL HYSTERECTOMY  03/19/1980   "partial"   BACK SURGERY     CERVICAL FUSION Left 11/2013   C4-C6   CERVICAL FUSION     3,4,5,   INSERT / REPLACE / REMOVE PACEMAKER  02/20/2013   medtronic   LOOP RECORDER IMPLANT N/A 11/06/2012   Procedure: LINQ LOOP RECORDER IMPLANT;  Surgeon: Sanda Klein, MD;  Location: [facility] CATH [facility];  Service: Cardiovascular;  Laterality: N/A;   NECK SURGERY  11/17/2013   PACEMAKER PLACEMENT N/A 02/16/2013   PERMANENT PACEMAKER INSERTION N/A 02/20/2013   Procedure: PERMANENT PACEMAKER INSERTION;  Surgeon: Dani Gobble Croitoru,  MD;  Location: [facility] CATH [facility];  Service: Cardiovascular;  Laterality: N/A;   POSTERIOR LUMBAR FUSION  ~ 2009   ROBOTIC ASSISTED SALPINGO OOPHERECTOMY Bilateral 11/25/2018   Procedure: XI ROBOTIC ASSISTED BILATERAL SALPINGO OOPHORECTOMY and LYSIS OF ADHESIONS.;  Surgeon: Everitt Amber, MD;  Location: [facility] OR[facility];  Service: Gynecology;   Laterality: Bilateral;   SPINAL FUSION  03/19/2014   TONSILLECTOMY     TRANSTHORACIC ECHOCARDIOGRAM  07/11/2010   The left atrial size is normal.There is no evidence of mitral vavle prolaspe.Right ventriicular systolic pressure is normal . Injection of contrast documented no interatrial shunt. Essentially normal 2D echo -doppler study    Social History:  reports that she has been smoking cigarettes. She has a 30.00 pack-year smoking history. She has never used smokeless tobacco. She reports that she does not drink alcohol and does not use drugs.  Allergies  Allergen Reactions   Antibiotic Ear [Neomycin-Polymyxin-Hc] Other (See Comments)    Unknown reaction   Penicillins Other (See Comments)    Yeast infection Did it involve swelling of the face/tongue/throat, SOB, or low BP? No Did it involve sudden or severe rash/hives, skin peeling, or any reaction on the inside of your mouth or nose? No Did you need to seek medical attention at a hospital or doctor's office? Yes When did it last happen? More than 5 years ago  If all above answers are "NO", may proceed with cephalosporin use.     Family History  Adopted: Yes  Problem Relation Age of Onset   Heart attack Brother     Prior to Admission medications   Medication Sig Start Date End Date Taking? Authorizing Provider  ALPRAZolam Duanne Moron) 1 MG tablet Take 1 tablet (1 mg total) by mouth 3 (three) times daily as needed for anxiety. Must last at least 30 days. Patient taking differently: Take 0.5 mg by mouth daily as needed for anxiety. Must last at least 30 days. 10/17/21  Yes Donnal Moat T, PA-C  amitriptyline (ELAVIL) 25 MG tablet Take 2 tablets (50 mg total) by mouth at bedtime. Patient taking differently: Take 25 mg by mouth at bedtime as needed for sleep. 10/27/21  Yes Donnal Moat T, PA-C  divalproex (DEPAKOTE) 250 MG DR tablet Take 250 mg by mouth in the morning, at noon, and at bedtime. 11/01/21  Yes [provider]   hydrOXYzine (ATARAX) 50 MG tablet TAKE 1-2 TABLETS (50-100 MG TOTAL) BY MOUTH AT BEDTIME AS NEEDED. Patient taking differently: Take 50 mg by mouth at bedtime as needed for anxiety (sleep). 03/27/21  Yes Hurst, Teresa T, PA-C  Lemborexant (DAYVIGO) 10 MG TABS Take 10 mg by mouth at bedtime.   Yes [provider]  ondansetron (ZOFRAN) 8 MG tablet Take 8 mg by mouth 2 (two) times daily as needed for nausea/vomiting. 10/16/21  Yes [provider]  Oxycodone HCl 10 MG TABS Take 10 mg by mouth every 8 (eight) hours as needed for pain. 09/07/21  Yes [provider]  risperiDONE (RISPERDAL) 3 MG tablet TAKE 1 TABLET BY MOUTH AT BEDTIME. 09/29/21  Yes Hurst, Teresa T, PA-C  tiZANidine (ZANAFLEX) 4 MG tablet Take 4 mg by mouth every 8 (eight) hours as needed for muscle spasms.   Yes [provider]  warfarin (COUMADIN) 5 MG tablet Take 5 mg by mouth every morning. 10/31/21  Yes [provider]  acetaminophen (TYLENOL) 325 MG tablet Take 2 tablets (650 mg total) by mouth every 6 (six) hours as needed for mild pain (or  Fever >/= 101). Patient not taking: Reported on 11/11/2021 02/26/20   Domenic Polite, MD  potassium chloride SA (KLOR-CON M) 20 MEQ tablet Take 2 tablets (40 mEq total) by mouth daily for 3 days. Patient not taking: Reported on 11/11/2021 11/08/21 11/11/21  Barb Merino, MD    Physical Exam: Vitals:   11/11/21 0600 11/11/21 0621 11/11/21 0800 11/11/21 0830  BP: 122/72  139/81 (!) 116/90  Pulse: 79  84 77  Resp: 18  (!) 25 (!) 25  Temp:  98 F (36.7 C)    TempSrc:  Oral    SpO2: 98%  97% 98%  Weight:      Height:       Physical Exam Vitals and nursing note reviewed.  Constitutional:      General: She is awake. She is not in acute distress.    Appearance: Normal appearance. She is underweight.     Comments: Frail, chronically ill-appearing.  HENT:     Head: Normocephalic.     Nose: No rhinorrhea.     Mouth/Throat:     Mouth: Mucous  membranes are moist.  Eyes:     General: No scleral icterus.    Pupils: Pupils are equal, round, and reactive to light.  Neck:     Vascular: No JVD.  Cardiovascular:     Rate and Rhythm: Normal rate and regular rhythm.     Heart sounds: S1 normal and S2 normal.  Pulmonary:     Effort: Pulmonary effort is normal.     Breath sounds: Normal breath sounds.  Abdominal:     General: Bowel sounds are normal. There is no distension.     Palpations: Abdomen is soft.     Tenderness: There is no abdominal tenderness. There is no right CVA tenderness or left CVA tenderness.  Musculoskeletal:     Cervical back: Neck supple.     Right lower leg: No edema.     Left lower leg: No edema.  Skin:    General: Skin is warm and dry.  Neurological:     General: No focal deficit present.     Mental Status: She is alert and oriented to person, place, and time.  Psychiatric:        Mood and Affect: Mood normal.        Behavior: Behavior normal. Behavior is cooperative.    Data Reviewed:  Results are pending, will review when available.  Assessment and Plan: Principal Problem:   Gross hematuria Secondary to warfarin toxicity. However, active smoker since age 33. She has also had 12 pound weight loss recently. Warfarin has been held and vitamin K given. Monitor hematocrit and hemoglobin.   Transfuse as needed. Continue ceftriaxone 1 g IVPB daily. Urology on-call consulted.  Active Problems:   Supratherapeutic INR Hold warfarin for now. Daily PT/INR. Will need dose adjustment. Warfarin per pharmacy.    Hypokalemia Replenish. Magnesium was supplemented. Follow potassium level.    Underweight Has lost 12 pounds recently. Protein supplementation. Consider nutritional services evaluation.    Aortic atherosclerosis (HCC)   Hyperlipidemia Smoking cessation advised. Would benefit from statin therapy. Check fasting lipids.    Tobacco abuse Smoking cessation. Nicotine replacement  therapy as needed.   History of pulmonary embolism    Bipolar disorder ([place]) Continue with Depakote 250 mg p.o. twice daily. Continue risperidone 3 mg p.o. at bedtime. Follow-up with behavioral health and PCP.    Depression with anxiety Continue alprazolam as needed.   Advance Care Planning:  Code Status: Full Code   Consults: Urology Irine Seal, MD).  Family Communication:   Severity of Illness: The appropriate patient status for this patient is INPATIENT. Inpatient status is judged to be reasonable and necessary in order to provide the required intensity of service to ensure the patient's safety. The patient's presenting symptoms, physical exam findings, and initial radiographic and laboratory data in the context of their chronic comorbidities is felt to place them at high risk for further clinical deterioration. Furthermore, it is not anticipated that the patient will be medically stable for discharge from the hospital within 2 midnights of admission.   * I certify that at the point of admission it is my clinical judgment that the patient will require inpatient hospital care spanning beyond 2 midnights from the point of admission due to high intensity of service, high risk for further deterioration and high frequency of surveillance required.*  Author: Reubin Milan, MD 11/11/2021 8:48 AM  For on call review [email or web address].   This document was prepared using Dragon voice recognition software and may contain some unintended transcription errors.

## 2021-11-11 NOTE — Social Work (Signed)
CSW met with Pt, the Pt has agreed to try [facility] for med assist. CSW reach out to [facility] care, they will review Pt information.

## 2021-11-11 NOTE — ED Notes (Signed)
Pt ambulatory from ED lobby to treatment room w/out assistance.

## 2021-11-11 NOTE — Progress Notes (Signed)
[facility] not [facility] the [facility], CSW called [facility] as they also can not take with without adding a medicaid Pt. CSW called Angla from [facility], she will update CSW shortly with a response. CSW will make sure to write hand off at end of shift.

## 2021-11-11 NOTE — ED Provider Notes (Signed)
[facility] DEPT Provider Note   CSN: [id number] Arrival date & time: 11/10/21  2231     History  Chief Complaint  Patient presents with   Abnormal Lab   Hematuria    [name] is a 66 y.o. female.  Patient presents to the emergency department after noticing blood in her urine today.       Home Medications Prior to Admission medications   Medication Sig Start Date End Date Taking? Authorizing Provider  acetaminophen (TYLENOL) 325 MG tablet Take 2 tablets (650 mg total) by mouth every 6 (six) hours as needed for mild pain (or Fever >/= 101). 02/26/20   Domenic Polite, MD  ALPRAZolam Duanne Moron) 1 MG tablet Take 1 tablet (1 mg total) by mouth 3 (three) times daily as needed for anxiety. Must last at least 30 days. 10/17/21   Donnal Moat T, PA-C  amitriptyline (ELAVIL) 25 MG tablet Take 2 tablets (50 mg total) by mouth at bedtime. 10/27/21   Donnal Moat T, PA-C  divalproex (DEPAKOTE) 250 MG DR tablet Take 250 mg by mouth in the morning, at noon, and at bedtime. 11/01/21   [provider]  hydrOXYzine (ATARAX) 50 MG tablet TAKE 1-2 TABLETS (50-100 MG TOTAL) BY MOUTH AT BEDTIME AS NEEDED. Patient taking differently: Take 50-100 mg by mouth at bedtime as needed for anxiety. 03/27/21   Hurst, Helene Kelp T, PA-C  Lemborexant 5 MG TABS Take 5 mg by mouth at bedtime as needed (sleep).    [provider]  omeprazole (PRILOSEC) 40 MG capsule Take 40 mg by mouth every morning. 10/16/21   [provider]  ondansetron (ZOFRAN) 8 MG tablet Take 4-8 mg by mouth 2 (two) times daily as needed for nausea/vomiting. 10/16/21   [provider]  Oxycodone HCl 10 MG TABS Take 1 tablet by mouth 3 (three) times daily as needed for pain. 09/07/21   [provider]  potassium chloride SA (KLOR-CON M) 20 MEQ tablet Take 2 tablets (40 mEq total) by mouth daily for 3 days. 11/08/21 11/11/21  Barb Merino, MD  risperiDONE (RISPERDAL) 3 MG  tablet TAKE 1 TABLET BY MOUTH AT BEDTIME. 09/29/21   Hurst, Helene Kelp T, PA-C  tiZANidine (ZANAFLEX) 4 MG tablet Take 4 mg by mouth every 8 (eight) hours as needed for muscle spasms.    [provider]  warfarin (COUMADIN) 5 MG tablet Take 5 mg by mouth daily. 10/31/21   [provider]      Allergies    Antibiotic ear [neomycin-polymyxin-hc] and Penicillins    Review of Systems   Review of Systems  Physical Exam Updated Vital Signs BP 119/78   Pulse 82   Temp 98.1 F (36.7 C) (Oral)   Resp 18   Ht '5\' 7"'$  (1.702 m)   Wt 45.8 kg   SpO2 96%   BMI 15.82 kg/m  Physical Exam Vitals and nursing note reviewed.  Constitutional:      General: She is not in acute distress.    Appearance: She is well-developed.  HENT:     Head: Normocephalic and atraumatic.     Mouth/Throat:     Mouth: Mucous membranes are moist.  Eyes:     General: Vision grossly intact. Gaze aligned appropriately.     Extraocular Movements: Extraocular movements intact.     Conjunctiva/sclera: Conjunctivae normal.  Cardiovascular:     Rate and Rhythm: Normal rate and regular rhythm.     Pulses: Normal pulses.  Heart sounds: Normal heart sounds, S1 normal and S2 normal. No murmur heard.    No friction rub. No gallop.  Pulmonary:     Effort: Pulmonary effort is normal. No respiratory distress.     Breath sounds: Normal breath sounds.  Abdominal:     General: Bowel sounds are normal.     Palpations: Abdomen is soft.     Tenderness: There is no abdominal tenderness. There is no guarding or rebound.     Hernia: No hernia is present.  Musculoskeletal:        General: No swelling.     Cervical back: Full passive range of motion without pain, normal range of motion and neck supple. No spinous process tenderness or muscular tenderness. Normal range of motion.     Right lower leg: No edema.     Left lower leg: No edema.  Skin:    General: Skin is warm and dry.     Capillary Refill: Capillary refill  takes less than 2 seconds.     Findings: No ecchymosis, erythema, rash or wound.  Neurological:     General: No focal deficit present.     Mental Status: She is alert and oriented to person, place, and time.     GCS: GCS eye subscore is 4. GCS verbal subscore is 5. GCS motor subscore is 6.     Cranial Nerves: Cranial nerves 2-12 are intact.     Sensory: Sensation is intact.     Motor: Motor function is intact.     Coordination: Coordination is intact.  Psychiatric:        Attention and Perception: Attention normal.        Mood and Affect: Mood normal.        Speech: Speech normal.        Behavior: Behavior normal.     ED Results / Procedures / Treatments   Labs (all labs ordered are listed, but only abnormal results are displayed) Labs Reviewed  CBC WITH DIFFERENTIAL/PLATELET - Abnormal; Notable for the following components:      Result Value   WBC 12.2 (*)    Neutro Abs 7.8 (*)    Monocytes Absolute 1.4 (*)    All other components within normal limits  COMPREHENSIVE METABOLIC PANEL - Abnormal; Notable for the following components:   Potassium 3.0 (*)    Calcium 8.8 (*)    AST 11 (*)    All other components within normal limits  PROTIME-INR - Abnormal; Notable for the following components:   Prothrombin Time 87.0 (*)    INR 11.3 (*)    All other components within normal limits  URINALYSIS, ROUTINE W REFLEX MICROSCOPIC    EKG None  Radiology No results found.  Procedures Procedures    Medications Ordered in ED Medications  sodium chloride 0.9 % bolus 500 mL (has no administration in time range)  phytonadione (VITAMIN K) 2.5 mg in dextrose 5 % 50 mL IVPB (has no administration in time range)    ED Course/ Medical Decision Making/ A&P                           Medical Decision Making Amount and/or Complexity of Data Reviewed Labs: ordered.   Patient presents to the emergency department for evaluation of hematuria.  Patient recently diagnosed with a PE at  an outside hospital, started on Coumadin.  She was then hospitalized this past week for supratherapeutic INR.  During that  hospitalization it was speculated that she might of had some medication errors at home.  She was discharged with an INR of 2.1.  She is now bleeding in her bladder and INR is above 11 again.        Final Clinical Impression(s) / ED Diagnoses Final diagnoses:  Gross hematuria  Coumadin toxicity, accidental or unintentional, initial encounter    Rx / DC Orders ED Discharge Orders     None         Hank Walling, Gwenyth Allegra, MD 11/11/21 [phone]

## 2021-11-12 DIAGNOSIS — N200 Calculus of kidney: Secondary | ICD-10-CM | POA: Diagnosis not present

## 2021-11-12 DIAGNOSIS — R310 Gross hematuria: Secondary | ICD-10-CM | POA: Diagnosis not present

## 2021-11-12 LAB — PROTIME-INR
INR: 2.9 — ABNORMAL HIGH (ref 0.8–1.2)
Prothrombin Time: 30.3 seconds — ABNORMAL HIGH (ref 11.4–15.2)

## 2021-11-12 LAB — HEMOGLOBIN: Hemoglobin: 10.9 g/dL — ABNORMAL LOW (ref 12.0–15.0)

## 2021-11-12 MED ORDER — IOHEXOL 9 MG/ML PO SOLN
ORAL | Status: AC
  Filled 2021-11-12: qty 1000

## 2021-11-12 MED ORDER — POTASSIUM CHLORIDE CRYS ER 20 MEQ PO TBCR
40.0000 meq | EXTENDED_RELEASE_TABLET | Freq: Once | ORAL | Status: AC
Start: 2021-11-12 — End: 2021-11-12
  Administered 2021-11-12: 40 meq via ORAL
  Filled 2021-11-12: qty 2

## 2021-11-12 MED ORDER — MELATONIN 3 MG PO TABS
3.0000 mg | ORAL_TABLET | Freq: Once | ORAL | Status: AC
  Administered 2021-11-12: 3 mg via ORAL
  Filled 2021-11-12: qty 1

## 2021-11-12 NOTE — Progress Notes (Signed)
PROGRESS NOTE  [name]  DOB: 1955-11-09  PCP: Lujean Amel, MD LPF:[phone]  DOA: 11/10/2021  LOS: 1 day  Hospital Day: 3  Brief narrative: [name] is a 66 y.o. female with PMH significant for HLD, sinus pause s/p PPM,  anxiety/depression, bipolar disorder, ADHD, cervical spondylosis, degeneration of the lumbar intervertebral disc, lumbar spine fusion, history of cervical cancer. Patient reports she has a history of 'cervical cancer' several decades ago in a childbearing age and had only local resection.  She did not require any radiation or chemotherapy at that time.  3 years ago, she had recurrence of the cancer and underwent hysterectomy and oophorectomy bilaterally.  She states she saw an oncologist at our cancer center at the time. 8/7-8/16/2023, she was hospitalized at [facility] in [place] while she was visiting there.  Per history, she initially presented with seizure-like activity and started on Depakote.  Continues video EEG showed bilateral and focal cerebral dysfunction and focal cortical irritability which improved with AED.  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  She was initiated on Lovenox and subsequently started on Coumadin with plan to continue both until INR was therapeutic.  It seems there was a concern for antiphospholipid syndrome because of which Coumadin and not DOAC were chosen.  On the day of discharge on 8/16, INR was repeated at 2.65.  It seems, despite therapeutic INR level she was discharged on both Lovenox and Coumadin.  It seems patient was mistakenly taking Coumadin twice a day. 8/18, she followed up with her PCP and was noted to have an INR of 5.6.  Per chart review, clinic was  unable to reach the patient or her daughter with the lab result.  Finally on 8/22, patient was directed to ED, noted to have INR more than 10 but did not have any active bleeding at the time.  She was given IV vitamin K 2.5 mg, observed overnight with significant improvement in INR to 2.1 and was subsequently discharged on 8/23.  8/25, patient returned to the ED with complaint of blood in urine.  In the ED, patient was hemodynamically stable, breathing on room air Labs showed INR elevated to 11.3, hemoglobin at 12 Urinalysis showed cloudy brown color urine with moderate amount of hemoglobin, more than 50 RBCs, trace leukocytes, positive nitrate, few bacteria. Patient was given 1 dose of IV vitamin K 2.5 mg. Admitted to hospital service  Subjective: Patient was seen and examined this morning.  Pleasant middle-aged Caucasian female. Remains hemodynamically stable. Labs this morning with hemoglobin at 10.9, INR at 2.9  Assessment and plan: Gross hematuria Presented with hematuria due to supratherapeutic INR Urinalysis as above with more than 50 RBCs CT renal study was done which just showed a 2 mm left renal calculus. No evidence of ureteral calculi or hydronephrosis. No radiographic evidence of urinary tract neoplasm. Urology consult appreciated. Patient states she continued to have hematuria this morning.  Continue to monitor  Supratherapeutic INR It seems patient was mistakenly taking Coumadin twice a day INR at presentation was 11.3.  1 dose of IV vitamin K 2.5 mg was given with improvement in Coumadin to therapeutic range.   Pharmacy monitoring INR and dosing Coumadin. 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   Also reports recent 12 pound weight loss. Given her history of cervical cancer 3 years ago requiring hysterectomy and nephrectomy 3 years ago, may need further hematology evaluation to rule out recurrence of cancer.  Will discuss with oncologist.  UTI Initiated on Rocephin1 g IVPB daily  Hypokalemia Potassium low at 3 at presentation.  Was 2.9 yesterday.  Replaced.  Repeat Recent Labs  Lab 11/07/21 1647 11/08/21 0411 11/10/21 2259 11/11/21 0833  K 3.5 2.8* 3.0* 2.9*  MG  --  2.0  --  1.8   Recently diagnosed seizure Per report, she had seizure during a hospitalization recently in [place] and was started on Depakote. Continue with Depakote 250 mg p.o. twice daily.  Aortic atherosclerosis Hyperlipidemia Would benefit from statin therapy.  Defer to primary.    Multiple psychiatric issues Bipolar disorder, anxiety/depression, ADHD Continue risperidone 3 mg p.o. at bedtime, Xanax as needed Follow-up with behavioral health and PCP.  Chronic daily smoker  smoking cessation advised.  Goals of care   Code Status: Full Code    Mobility: Encourage ambulation  Skin assessment:     Nutritional status:  Body mass index is 15.82 kg/m.          Diet:  Diet Order             Diet Heart Room service appropriate? Yes; Fluid consistency: Thin  Diet effective now                   DVT prophylaxis:  SCDs Start: 11/11/21 0835   Antimicrobials: IV Rocephin empirically Fluid: None Consultants: We will call oncology Family Communication: None at bedside  Status is: Inpatient  Continue in-hospital care because: Needs further monitoring for hematuria, INR monitoring as she seems to have brittle INR despite being on low-dose of Coumadin Level of care: Progressive   Dispo: The patient is  from: Home              Anticipated d/c is to: Home              Patient currently is not medically stable to d/c.   Difficult to place patient No     Infusions:   cefTRIAXone (ROCEPHIN)  IV 1 g (11/12/21 0921)    Scheduled Meds:  divalproex  250 mg Oral TID   pantoprazole  40 mg Oral Daily   risperiDONE  3 mg Oral QHS    PRN meds: acetaminophen **OR** acetaminophen, ALPRAZolam, nicotine, ondansetron **OR** ondansetron (ZOFRAN) IV   Antimicrobials: Anti-infectives (From admission, onward)    Start     Dose/Rate Route Frequency Ordered Stop   11/12/21 1000  cefTRIAXone (ROCEPHIN) 1 g in sodium chloride 0.9 % 100 mL IVPB        1 g 200 mL/hr over 30 Minutes Intravenous Daily 11/11/21 0833     11/11/21 0730  cefTRIAXone (ROCEPHIN) 1 g in sodium chloride 0.9 % 100 mL IVPB        1 g 200 mL/hr over 30 Minutes Intravenous  Once 11/11/21 0725 11/11/21 0845  Objective: Vitals:   11/12/21 0209 11/12/21 0615  BP: (!) 140/79 116/71  Pulse: 87 84  Resp: 17 15  Temp: 98.6 F (37 C) 98 F (36.7 C)  SpO2: 99% 96%    Intake/Output Summary (Last 24 hours) at 11/12/2021 1159 Last data filed at 11/12/2021 1011 Gross per 24 hour  Intake 1120.72 ml  Output 1920 ml  Net -799.28 ml   Filed Weights   11/10/21 2246  Weight: 45.8 kg   Weight change:  Body mass index is 15.82 kg/m.   Physical Exam: General exam: Pleasant, middle-aged Caucasian female.  Not in physical distress Skin: No rashes, lesions or ulcers. HEENT: Atraumatic, normocephalic, no obvious bleeding Lungs: Clear to auscultation bilaterally CVS: Regular rate and rhythm, no murmur GI/Abd soft, nontender, nondistended, bowel sound present CNS: Alert, awake, oriented x3 Psychiatry: Mood appropriate Extremities: No pedal edema, no calf tenderness  Data Review: I have personally reviewed the laboratory data and studies available.  F/u labs ordered Unresulted Labs (From admission, onward)     Start      Ordered   11/13/21 0500  CBC with Differential/Platelet  Tomorrow morning,   R       Question:  Specimen collection method  Answer:  Lab=Lab collect   11/12/21 0831   11/13/21 7793  Basic metabolic panel  Tomorrow morning,   R       Question:  Specimen collection method  Answer:  Lab=Lab collect   11/12/21 0831   11/13/21 0500  Magnesium  Tomorrow morning,   R       Question:  Specimen collection method  Answer:  Lab=Lab collect   11/12/21 0831   11/13/21 0500  Phosphorus  Tomorrow morning,   R       Question:  Specimen collection method  Answer:  Lab=Lab collect   11/12/21 0831   11/12/21 0500  Protime-INR  Daily at 5am,   R      11/11/21 9030            Signed, Terrilee Croak, MD [facility] 11/12/2021

## 2021-11-12 NOTE — Consult Note (Signed)
Subjective: 1. Gross hematuria   2. Coumadin toxicity, accidental or unintentional, initial encounter   3. Acute cystitis with hematuria      Consult requested by Dr. Gerri Lins Laiyah [name] is a 66 yo female who was admitted intially on 8/22 with a supratherapuetic INR of >10 following initiation of Lovenox and Coumadin for LLE DVT, PE and dural venous sinus thrombosis.  She continued the Lovenox and was taking the Coumadin BID instead of daily as prescribed for several days.  She was discharged on 8/23 with an INR that was back down to 2.1 but she was readmitted on 8/26 with the INR back up to >10 and hematuria.  She has had a history of UTI's with Proteus in 1/22 and enterococcus in 12/22.  She reports another UTI 2 weeks ago for which she was treated.   She has had no flank pain, dysuria or frequency.  Her UA on admission had TNTC RBC's and was nit+ but no WBC or bacteria were noted.  She had a CT hematuria study yesterday that demonstrated a 58m non-obstructing LLP stone but no other abnormalities.  She had a hysterectomy for cervical CA at 24 but no radiation and a robotic BSO in 2020 for a complex left ovarian cyst.   She has had no urologic surgery or prior stones.   She has a 40 yr smoking history.    ROS:  Review of Systems  All other systems reviewed and are negative.   Allergies  Allergen Reactions   Antibiotic Ear [Neomycin-Polymyxin-Hc] Other (See Comments)    Unknown reaction   Penicillins Other (See Comments)    Yeast infection Did it involve swelling of the face/tongue/throat, SOB, or low BP? No Did it involve sudden or severe rash/hives, skin peeling, or any reaction on the inside of your mouth or nose? No Did you need to seek medical attention at a hospital or doctor's office? Yes When did it last happen? More than 5 years ago  If all above answers are "NO", may proceed with cephalosporin use.     Past Medical History:  Diagnosis Date   Anxiety    anxiety and  mild depressive order systoms   Bipolar disorder ([place]     (02/20/2013)   Cervical cancer ([facility] 03/19/1980   Cholelithiasis 11/11/2021   Depression    Dysrhythmia    Headache(784.0)    "monthly" (02/20/2013)   Hyperlipidemia 11/11/2021   Pacemaker    medtronic   PONV (postoperative nausea and vomiting)    Sinus pause 02/14/2013   Syncope and collapse    echo-April 12,2012-EF 55% Echo normal; recorder with prolonged sinus pauses --> status post   Tobacco abuse    Vertigo     Past Surgical History:  Procedure Laterality Date   ABDOMINAL HYSTERECTOMY  03/19/1980   "partial"   BACK SURGERY     CERVICAL FUSION Left 11/2013   C4-C6   CERVICAL FUSION     3,4,5,   INSERT / REPLACE / REMOVE PACEMAKER  02/20/2013   medtronic   LOOP RECORDER IMPLANT N/A 11/06/2012   Procedure: LINQ LOOP RECORDER IMPLANT;  Surgeon: MSanda Klein MD;  [facility];  Service: Cardiovascular;  Laterality: N/A;   NECK SURGERY  11/17/2013   PACEMAKER PLACEMENT N/A 02/16/2013   PERMANENT PACEMAKER INSERTION N/A 02/20/2013   Procedure: PERMANENT PACEMAKER INSERTION;  Surgeon: MSanda Klein MD;  [facility];  Service: Cardiovascular;  Laterality: N/A;   POSTERIOR LUMBAR FUSION  ~  2009   ROBOTIC ASSISTED SALPINGO OOPHERECTOMY Bilateral 11/25/2018   Procedure: XI ROBOTIC ASSISTED BILATERAL SALPINGO OOPHORECTOMY and LYSIS OF ADHESIONS.;  Surgeon: Everitt Amber, MD;  Location: [facility];  Service: Gynecology;  Laterality: Bilateral;   SPINAL FUSION  03/19/2014   TONSILLECTOMY     TRANSTHORACIC ECHOCARDIOGRAM  07/11/2010   The left atrial size is normal.There is no evidence of mitral vavle prolaspe.Right ventriicular systolic pressure is normal . Injection of contrast documented no interatrial shunt. Essentially normal 2D echo -doppler study     Social History   Socioeconomic History   Marital status: Widowed    Spouse name: Not on file   Number of children: 3   Years of education: 30   Highest  education level: Not on file  Occupational History   Not on file  Tobacco Use   Smoking status: Every Day    Packs/day: 0.75    Years: 40.00    Total pack years: 30.00    Types: Cigarettes   Smokeless tobacco: Never  Vaping Use   Vaping Use: Never used  Substance and Sexual Activity   Alcohol use: No   Drug use: No   Sexual activity: Yes  Other Topics Concern   Not on file  Social History Narrative   Married 29 years . Has 3 children. Current smoker -1 pack a day-for 36 years .Alcohol :  1 beer on occasion.   She is soon to be a grandmother.  His before to going on a trip up to the [place] area to be closer to family when the baby is born.  She is extremely excited.   She very much would like to move back up to that area to be closer to family, but the whether is it just too cold in the winter.   Right handed    Soda daily   Lives alone   Social Determinants of Health   Financial Resource Strain: Low Risk  (10/11/2020)   Overall Financial Resource Strain (CARDIA)    Difficulty of Paying Living Expenses: Not hard at all  Food Insecurity: No Food Insecurity (10/11/2020)   Hunger Vital Sign    Worried About Running Out of Food in the Last Year: Never true    Ran Out of Food in the Last Year: Never true  Transportation Needs: No Transportation Needs (10/11/2020)   PRAPARE - Hydrologist (Medical): No    Lack of Transportation (Non-Medical): No  Physical Activity: Not on file  Stress: No Stress Concern Present (10/11/2020)   [place]    Feeling of Stress : Only a little  Social Connections: Not on file  Intimate Partner Violence: Not At Risk (10/11/2020)   Humiliation, Afraid, Rape, and Kick questionnaire    Fear of Current or Ex-Partner: No    Emotionally Abused: No    Physically Abused: No    Sexually Abused: No    Family History  Adopted: Yes  Problem Relation Age  of Onset   Heart attack Brother     Anti-infectives: Anti-infectives (From admission, onward)    Start     Dose/Rate Route Frequency Ordered Stop   11/12/21 1000  cefTRIAXone (ROCEPHIN) 1 g in sodium chloride 0.9 % 100 mL IVPB        1 g 200 mL/hr over 30 Minutes Intravenous Daily 11/11/21 0833     11/11/21 0730  cefTRIAXone (ROCEPHIN) 1 g in sodium chloride  0.9 % 100 mL IVPB        1 g 200 mL/hr over 30 Minutes Intravenous  Once 11/11/21 0725 11/11/21 0845       Current Facility-Administered Medications  Medication Dose Route Frequency Provider Last Rate Last Admin   acetaminophen (TYLENOL) tablet 650 mg  650 mg Oral Q6H PRN Reubin Milan, MD       Or   acetaminophen (TYLENOL) suppository 650 mg  650 mg Rectal Q6H PRN Reubin Milan, MD       ALPRAZolam Duanne Moron) tablet 0.5 mg  0.5 mg Oral Daily PRN Reubin Milan, MD   0.5 mg at 11/11/21 2258   cefTRIAXone (ROCEPHIN) 1 g in sodium chloride 0.9 % 100 mL IVPB  1 g Intravenous Daily Reubin Milan, MD       divalproex (DEPAKOTE) DR tablet 250 mg  250 mg Oral TID Reubin Milan, MD   250 mg at 11/11/21 2137   nicotine (NICODERM CQ - dosed in mg/24 hours) patch 14 mg  14 mg Transdermal Daily PRN Reubin Milan, MD       ondansetron [facility]) tablet 4 mg  4 mg Oral Q6H PRN Reubin Milan, MD       Or   ondansetron [facility]) injection 4 mg  4 mg Intravenous Q6H PRN Reubin Milan, MD       pantoprazole (PROTONIX) EC tablet 40 mg  40 mg Oral Daily Reubin Milan, MD   40 mg at 11/11/21 1020   potassium chloride SA (KLOR-CON M) CR tablet 40 mEq  40 mEq Oral Once Terrilee Croak, MD       risperiDONE (RISPERDAL) tablet 3 mg  3 mg Oral QHS Reubin Milan, MD   3 mg at 11/11/21 2137     Objective: Vital signs in last 24 hours: BP 116/71 (BP Location: Left Arm)   Pulse 84   Temp 98 F (36.7 C) (Oral)   Resp 15   Ht '5\' 7"'$  (1.702 m)   Wt 45.8 kg   SpO2 96%   BMI 15.82 kg/m    Intake/Output from previous day: 08/26 0701 - 08/27 0700 In: 789.9 [I.V.:739.2; IV Piggyback:50.7] Out: 1620 [Urine:1620] Intake/Output this shift: Total I/O In: 30.8 [I.V.:30.8] Out: -    Physical Exam Vitals reviewed.  Constitutional:      Appearance: Normal appearance.     Comments: But thin  Cardiovascular:     Rate and Rhythm: Normal rate and regular rhythm.     Heart sounds: Normal heart sounds.  Pulmonary:     Effort: Pulmonary effort is normal.     Breath sounds: Normal breath sounds.  Abdominal:     General: Abdomen is flat.     Palpations: Abdomen is soft. There is no mass.     Tenderness: There is no abdominal tenderness.  Musculoskeletal:        General: No swelling or tenderness. Normal range of motion.  Skin:    General: Skin is warm and dry.  Neurological:     General: No focal deficit present.     Mental Status: She is alert and oriented to person, place, and time.     Lab Results:  Results for orders placed or performed during the hospital encounter of 11/10/21 (from the past 24 hour(s))  Protime-INR     Status: Abnormal   Collection Time: 11/12/21  5:17 AM  Result Value Ref Range   Prothrombin Time 30.3 (H) 11.4 - 15.2  seconds   INR 2.9 (H) 0.8 - 1.2  Hemoglobin     Status: Abnormal   Collection Time: 11/12/21  5:17 AM  Result Value Ref Range   Hemoglobin 10.9 (L) 12.0 - 15.0 g/dL    BMET Recent Labs    11/10/21 2259 11/11/21 0833  NA 144 141  K 3.0* 2.9*  CL 110 109  CO2 25 24  GLUCOSE 87 87  BUN 12 10  CREATININE 0.79 0.67  CALCIUM 8.8* 8.0*   PT/INR Recent Labs    11/11/21 0833 11/12/21 0517  LABPROT 27.9* 30.3*  INR 2.6* 2.9*   ABG No results for input(s): "PHART", "HCO3" in the last 72 hours.  Invalid input(s): "PCO2", "PO2"  Studies/Results: CT HEMATURIA WORKUP  Result Date: 11/11/2021 CLINICAL DATA:  Hematuria. EXAM: CT ABDOMEN AND PELVIS WITHOUT AND WITH CONTRAST TECHNIQUE: Multidetector CT imaging of the  abdomen and pelvis was performed following the standard protocol before and following the bolus administration of intravenous contrast. RADIATION DOSE REDUCTION: This exam was performed according to the departmental dose-optimization program which includes automated exposure control, adjustment of the mA and/or kV according to patient size and/or use of iterative reconstruction technique. CONTRAST:  166m OMNIPAQUE IOHEXOL 300 MG/ML  SOLN COMPARISON:  11/07/2019 FINDINGS: Lower Chest: No acute findings. Hepatobiliary: No hepatic masses identified. A few tiny calcified gallstones are noted in the fundus, however there is no evidence of cholecystitis or biliary ductal dilatation. Pancreas:  No mass or inflammatory changes. Spleen: Within normal limits in size and appearance. Adrenals/Urinary Tract: No adrenal masses identified. A 2 mm calculus is noted in the lower pole of the left kidney. No evidence of ureteral calculi or hydronephrosis. No suspicious renal masses identified. No masses seen involving the collecting systems, ureters, or bladder. Stomach/Bowel: No evidence of obstruction, inflammatory process or abnormal fluid collections. Normal appendix visualized. Vascular/Lymphatic: No pathologically enlarged lymph nodes. No acute vascular findings. Aortic atherosclerotic calcification incidentally noted. Reproductive: Prior hysterectomy noted. Adnexal regions are unremarkable in appearance. Other:  None. Musculoskeletal: No suspicious bone lesions identified. Prior fusion again noted at L4-5. Subacute, healing fracture of the right pubic bone incidentally noted. IMPRESSION: 2 mm left renal calculus. No evidence of ureteral calculi or hydronephrosis. No radiographic evidence of urinary tract neoplasm. Cholelithiasis. No radiographic evidence of cholecystitis. Subacute, healing fracture of right pubic bone. Aortic Atherosclerosis (ICD10-I70.0). Electronically Signed   By: JMarlaine HindM.D.   On: 11/11/2021 17:49      Assessment/Plan: Gross hematuria with a supratherapeutic INR.   No significant findings are noted on CT to indicate a cause of the hematuria.   She will need f/u in my office for cystoscopy to assess the bladder mucosa particularly with her smoking history.  History of UTI.  The UA was Nit+ but that could be artifact from the gross hematuria.  She had insignificant WBC and bacteria but rocephin is appropriate with her history of UTI's pending the urine culture.    Renal stone.   The LLP stone is 271mand non-obstructive so it needs no treatment at this time.       No follow-ups on file.    CC: Dr. DaReubin Milan     JoIrine Seal/27/2023 [phone]

## 2021-11-12 NOTE — Progress Notes (Addendum)
ANTICOAGULATION CONSULT NOTE - Initial Consult  Pharmacy Consult for warfarin Indication:  VTE treatment  Allergies  Allergen Reactions   Antibiotic Ear [Neomycin-Polymyxin-Hc] Other (See Comments)    Unknown reaction   Penicillins Other (See Comments)    Yeast infection Did it involve swelling of the face/tongue/throat, SOB, or low BP? No Did it involve sudden or severe rash/hives, skin peeling, or any reaction on the inside of your mouth or nose? No Did you need to seek medical attention at a hospital or doctor's office? Yes When did it last happen? More than 5 years ago  If all above answers are "NO", may proceed with cephalosporin use.     Patient Measurements: Height: '5\' 7"'$  (170.2 cm) Weight: 45.8 kg (101 lb) IBW/kg (Calculated) : 61.6  Vital Signs: Temp: 98 F (36.7 C) (08/27 0615) Temp Source: Oral (08/27 0615) BP: 116/71 (08/27 0615) Pulse Rate: 84 (08/27 0615)  Labs: Recent Labs    11/10/21 2259 11/11/21 0833 11/12/21 0517  HGB 12.0 10.7* 10.9*  HCT 38.2 33.1*  --   PLT 232 191  --   LABPROT 87.0* 27.9* 30.3*  INR 11.3* 2.6* 2.9*  CREATININE 0.79 0.67  --     Estimated Creatinine Clearance: 51.4 mL/min (by C-G formula based on SCr of 0.67 mg/dL).   Medical History: Past Medical History:  Diagnosis Date   Anxiety    anxiety and mild depressive order systoms   Bipolar disorder ([place])     (02/20/2013)   Cervical cancer ([place]) 03/19/1980   Cholelithiasis 11/11/2021   Depression    Dysrhythmia    Headache(784.0)    "monthly" (02/20/2013)   Hyperlipidemia 11/11/2021   Pacemaker    medtronic   PONV (postoperative nausea and vomiting)    Sinus pause 02/14/2013   Syncope and collapse    echo-April 12,2012-EF 55% Echo normal; recorder with prolonged sinus pauses --> status post   Tobacco abuse    Vertigo     Medications: Warfarin PTA -Last dose: 8/25  Assessment: Pt is a 69 yoF who was recently started on warfarin. 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 Appears there was also concern for antiphospholipid syndrome. Pt was discharged on 11/01/21 on warfarin with enoxaparin bridge.   Patient had elevated INR (5.6) on outpatient follow up on 8/18 but clinic was unable to reach patient or daughter regarding results. Pt then presented to the ED on 8/22 with INR >10.   Pt reported accidentally taking warfarin TWICE daily for several days during this time period in addition to being on enoxaparin bridge. Pt received vitamin K and was discharged on 8/23. She presented back to the ED on 8/25 PM with hematuria and supratherapeutic INR and reports taking warfarin once daily as prescribed.   Pharmacy consulted to monitor INR and dose warfarin once instructed to restart by MD.   Significant Events: -11/01/21: Discharged from hospital in [place] on warfarin + enoxaparin bridge. INR = 2.65 at OSH in [place] -11/03/21: INR = 5.6 at OSH in [place] -11/07/21: INR >10 in ED. Vitamin K 2.5 mg IV once -11/08/21: INR 2.1. Discharged on warfarin 5 mg PO daily -11/10/21: INR 11.3. Vitamin K 2.5 mg IV once  Today, 11/12/21 INR = 2.9 is therapeutic today Hgb 10.9 slightly low but stable Hematuria has resolved.  Urine is clear today per RN. No other signs of bleeding.  Goal of Therapy:  INR 2-3 Monitor platelets by anticoagulation protocol: Yes   Plan:  MD would like to hold warfarin today INR daily CBC with AM labs tomorrow. Monitor for signs of bleeding. Patient would benefit from medication counseling prior to discharge.  Lenis Noon, PharmD 11/12/2021,12:25 PM

## 2021-11-13 ENCOUNTER — Telehealth: Payer: Self-pay | Admitting: Physician Assistant

## 2021-11-13 ENCOUNTER — Observation Stay (HOSPITAL_COMMUNITY): Payer: Medicare Other

## 2021-11-13 DIAGNOSIS — Z79899 Other long term (current) drug therapy: Secondary | ICD-10-CM | POA: Diagnosis not present

## 2021-11-13 DIAGNOSIS — D6832 Hemorrhagic disorder due to extrinsic circulating anticoagulants: Secondary | ICD-10-CM | POA: Diagnosis present

## 2021-11-13 DIAGNOSIS — F319 Bipolar disorder, unspecified: Secondary | ICD-10-CM | POA: Diagnosis present

## 2021-11-13 DIAGNOSIS — T45515A Adverse effect of anticoagulants, initial encounter: Secondary | ICD-10-CM | POA: Diagnosis present

## 2021-11-13 DIAGNOSIS — Z881 Allergy status to other antibiotic agents status: Secondary | ICD-10-CM | POA: Diagnosis not present

## 2021-11-13 DIAGNOSIS — M47814 Spondylosis without myelopathy or radiculopathy, thoracic region: Secondary | ICD-10-CM | POA: Diagnosis not present

## 2021-11-13 DIAGNOSIS — R636 Underweight: Secondary | ICD-10-CM | POA: Diagnosis present

## 2021-11-13 DIAGNOSIS — R918 Other nonspecific abnormal finding of lung field: Secondary | ICD-10-CM | POA: Diagnosis not present

## 2021-11-13 DIAGNOSIS — Z880 Allergy status to penicillin: Secondary | ICD-10-CM | POA: Diagnosis not present

## 2021-11-13 DIAGNOSIS — Z86711 Personal history of pulmonary embolism: Secondary | ICD-10-CM | POA: Diagnosis not present

## 2021-11-13 DIAGNOSIS — R310 Gross hematuria: Secondary | ICD-10-CM | POA: Diagnosis not present

## 2021-11-13 DIAGNOSIS — Z8249 Family history of ischemic heart disease and other diseases of the circulatory system: Secondary | ICD-10-CM | POA: Diagnosis not present

## 2021-11-13 DIAGNOSIS — J432 Centrilobular emphysema: Secondary | ICD-10-CM | POA: Diagnosis not present

## 2021-11-13 DIAGNOSIS — F17210 Nicotine dependence, cigarettes, uncomplicated: Secondary | ICD-10-CM | POA: Diagnosis present

## 2021-11-13 DIAGNOSIS — Z981 Arthrodesis status: Secondary | ICD-10-CM | POA: Diagnosis not present

## 2021-11-13 DIAGNOSIS — Z86718 Personal history of other venous thrombosis and embolism: Secondary | ICD-10-CM | POA: Diagnosis not present

## 2021-11-13 DIAGNOSIS — K219 Gastro-esophageal reflux disease without esophagitis: Secondary | ICD-10-CM | POA: Diagnosis present

## 2021-11-13 DIAGNOSIS — F419 Anxiety disorder, unspecified: Secondary | ICD-10-CM | POA: Diagnosis present

## 2021-11-13 DIAGNOSIS — K8020 Calculus of gallbladder without cholecystitis without obstruction: Secondary | ICD-10-CM | POA: Diagnosis not present

## 2021-11-13 DIAGNOSIS — B961 Klebsiella pneumoniae [K. pneumoniae] as the cause of diseases classified elsewhere: Secondary | ICD-10-CM | POA: Diagnosis present

## 2021-11-13 DIAGNOSIS — R54 Age-related physical debility: Secondary | ICD-10-CM | POA: Diagnosis present

## 2021-11-13 DIAGNOSIS — Z8541 Personal history of malignant neoplasm of cervix uteri: Secondary | ICD-10-CM | POA: Diagnosis not present

## 2021-11-13 DIAGNOSIS — N390 Urinary tract infection, site not specified: Secondary | ICD-10-CM | POA: Diagnosis present

## 2021-11-13 DIAGNOSIS — Z681 Body mass index (BMI) 19 or less, adult: Secondary | ICD-10-CM | POA: Diagnosis not present

## 2021-11-13 DIAGNOSIS — I700 Atherosclerosis of aorta: Secondary | ICD-10-CM | POA: Diagnosis not present

## 2021-11-13 DIAGNOSIS — J90 Pleural effusion, not elsewhere classified: Secondary | ICD-10-CM | POA: Diagnosis not present

## 2021-11-13 DIAGNOSIS — Z950 Presence of cardiac pacemaker: Secondary | ICD-10-CM | POA: Diagnosis not present

## 2021-11-13 DIAGNOSIS — E876 Hypokalemia: Secondary | ICD-10-CM | POA: Diagnosis present

## 2021-11-13 DIAGNOSIS — E785 Hyperlipidemia, unspecified: Secondary | ICD-10-CM | POA: Diagnosis present

## 2021-11-13 DIAGNOSIS — S32501A Unspecified fracture of right pubis, initial encounter for closed fracture: Secondary | ICD-10-CM | POA: Diagnosis not present

## 2021-11-13 LAB — URINE CULTURE: Culture: >=100000 — AB

## 2021-11-13 LAB — CBC WITH DIFFERENTIAL/PLATELET
Abs Immature Granulocytes: 0.06 10*3/uL (ref 0.00–0.07)
Basophils Absolute: 0.1 10*3/uL (ref 0.0–0.1)
Basophils Relative: 1 %
Eosinophils Absolute: 0.1 10*3/uL (ref 0.0–0.5)
Eosinophils Relative: 1 %
HCT: 33.7 % — ABNORMAL LOW (ref 36.0–46.0)
Hemoglobin: 10.6 g/dL — ABNORMAL LOW (ref 12.0–15.0)
Immature Granulocytes: 1 %
Lymphocytes Relative: 36 %
Lymphs Abs: 2.8 10*3/uL (ref 0.7–4.0)
MCH: 28.6 pg (ref 26.0–34.0)
MCHC: 31.5 g/dL (ref 30.0–36.0)
MCV: 90.8 fL (ref 80.0–100.0)
Monocytes Absolute: 1 10*3/uL (ref 0.1–1.0)
Monocytes Relative: 13 %
Neutro Abs: 3.8 10*3/uL (ref 1.7–7.7)
Neutrophils Relative %: 48 %
Platelets: 202 10*3/uL (ref 150–400)
RBC: 3.71 MIL/uL — ABNORMAL LOW (ref 3.87–5.11)
RDW: 14 % (ref 11.5–15.5)
WBC: 7.8 10*3/uL (ref 4.0–10.5)
nRBC: 0 % (ref 0.0–0.2)

## 2021-11-13 LAB — BASIC METABOLIC PANEL
Anion gap: 7 (ref 5–15)
BUN: 11 mg/dL (ref 8–23)
CO2: 24 mmol/L (ref 22–32)
Calcium: 8.6 mg/dL — ABNORMAL LOW (ref 8.9–10.3)
Chloride: 110 mmol/L (ref 98–111)
Creatinine, Ser: 0.77 mg/dL (ref 0.44–1.00)
GFR, Estimated: >60 mL/min (ref >60)
Glucose, Bld: 101 mg/dL — ABNORMAL HIGH (ref 70–99)
Potassium: 4.3 mmol/L (ref 3.5–5.1)
Sodium: 141 mmol/L (ref 135–145)

## 2021-11-13 LAB — PROTIME-INR
INR: 4 — ABNORMAL HIGH (ref 0.8–1.2)
Prothrombin Time: 38.3 seconds — ABNORMAL HIGH (ref 11.4–15.2)

## 2021-11-13 LAB — MAGNESIUM: Magnesium: 2.3 mg/dL (ref 1.7–2.4)

## 2021-11-13 LAB — PHOSPHORUS: Phosphorus: 3.5 mg/dL (ref 2.5–4.6)

## 2021-11-13 MED ORDER — IOHEXOL 300 MG/ML  SOLN
100.0000 mL | Freq: Once | INTRAMUSCULAR | Status: AC | PRN
  Administered 2021-11-13: 100 mL via INTRAVENOUS

## 2021-11-13 MED ORDER — SODIUM CHLORIDE (PF) 0.9 % IJ SOLN
INTRAMUSCULAR | Status: AC
  Filled 2021-11-13: qty 50

## 2021-11-13 MED ORDER — IOHEXOL 9 MG/ML PO SOLN
500.0000 mL | ORAL | Status: AC
  Administered 2021-11-13 (×2): 500 mL via ORAL

## 2021-11-13 MED ORDER — MELATONIN 3 MG PO TABS
3.0000 mg | ORAL_TABLET | Freq: Every day | ORAL | Status: DC
  Administered 2021-11-13: 3 mg via ORAL
  Filled 2021-11-13: qty 1

## 2021-11-13 NOTE — Care Management CC44 (Signed)
Condition Code 44 Documentation Completed  Patient Details  Name: [name] MRN: [id number] Date of Birth: 07/02/1955   Condition Code 44 given:  Yes Patient signature on Condition Code 44 notice:  Yes Documentation of 2 MD's agreement:  Yes Code 44 added to claim:  Yes    Dessa Phi, RN 11/13/2021, 10:27 AM

## 2021-11-13 NOTE — TOC Initial Note (Signed)
Transition of Care [facility]) - Initial/Assessment Note    Patient Details  Name: [name] MRN: [id number] Date of Birth: [birth year]  [facility]) CM/SW Contact:    Dessa Phi, RN Phone Number: 11/13/2021, 10:50 AM  Clinical Narrative:Spoke to patient in rm about d/c plans-Patient able to take meds independently. Has own transport home. No further CM needs.                   Expected Discharge Plan: Home/Self Care Barriers to Discharge: No Barriers Identified   Patient Goals and CMS Choice Patient states their goals for this hospitalization and ongoing recovery are::  (Home) CMS Medicare.gov Compare Post Acute Care list provided to:: Patient Choice offered to / list presented to : NA (Patient had no CM needs.)  Expected Discharge Plan and Services Expected Discharge Plan: Home/Self Care   Discharge Planning Services: CM Consult Post Acute Care Choice: NA (Home) Living arrangements for the past 2 months: Apartment                 DME Arranged: N/A DME Agency: NA         HH Agency: NA        Prior Living Arrangements/Services Living arrangements for the past 2 months: Apartment Lives with:: Adult Children Patient language and need for interpreter reviewed:: Yes Do you feel safe going back to the place where you live?: Yes      Need for Family Participation in Patient Care: Yes (Comment) Care giver support system in place?: Yes (comment) Current home services: DME (rw, 3n1) Criminal Activity/Legal Involvement Pertinent to Current Situation/Hospitalization: No - Comment as needed  Activities of Daily Living Home Assistive Devices/Equipment: Eyeglasses, Dentures (specify type) ADL Screening (condition at time of admission) Patient's cognitive ability adequate to safely complete daily activities?: Yes Is the patient deaf or have difficulty hearing?: No Does the patient have difficulty seeing, even when wearing glasses/contacts?: No Does the patient  have difficulty concentrating, remembering, or making decisions?: Yes Patient able to express need for assistance with ADLs?: Yes Does the patient have difficulty dressing or bathing?: No Independently performs ADLs?: Yes (appropriate for developmental age) Does the patient have difficulty walking or climbing stairs?: No Weakness of Legs: None Weakness of Arms/Hands: None  Permission Sought/Granted Permission sought to share information with : Case Manager Permission granted to share information with : Yes, Verbal Permission Granted  Share Information with NAME:  (Case Manager)           Emotional Assessment Appearance:: Appears stated age Attitude/Demeanor/Rapport: Gracious Affect (typically observed): Accepting Orientation: : Oriented to Self, Oriented to Place, Oriented to  Time, Oriented to Situation Alcohol / Substance Use: Tobacco Use Psych Involvement: No (comment)  Admission diagnosis:  Gross hematuria [R31.0] Acute cystitis with hematuria [N30.01] Coumadin toxicity, accidental or unintentional, initial encounter [T45.511A] Patient Active Problem List   Diagnosis Date Noted   Gross hematuria 11/11/2021   Aortic atherosclerosis ([place]) 11/11/2021   Cholelithiasis 11/11/2021   Hyperlipidemia 11/11/2021   Supratherapeutic INR 11/07/2021   History of pulmonary embolism 11/07/2021   Cerebral venous thrombosis of sigmoid sinus 11/07/2021   Seizure-like activity ([place]) 11/07/2021   Acute deep vein thrombosis (DVT) of left peroneal vein ([place]) 10/27/2021   Acute pulmonary embolism without acute cor pulmonale ([place]) 10/27/2021   Jugular vein thrombosis, left 10/27/2021   Electrolyte abnormality 05/04/2021   Altered mental status 05/04/2021   fall with rhabdomyloysis  05/03/2021   Leukocytosis 05/03/2021   Chronic  pain 05/03/2021   Underweight 05/03/2021   Lactic acidosis 90/24/0973   Acute metabolic encephalopathy 53/29/9242   Degeneration of lumbar intervertebral disc  08/16/2020   Sepsis secondary to UTI ([place]) 04/17/2020   Hypoglycemia without diagnosis of diabetes mellitus 04/17/2020   Sepsis due to pneumonia ([place]) 04/17/2020   Physical deconditioning    Myositis 03/03/2020   HCAP (healthcare-associated pneumonia) 02/21/2020   Generalized weakness 02/21/2020   GAD (generalized anxiety disorder) 02/21/2020   Proximal muscle weakness 02/08/2020   Dysphagia 02/08/2020   Gait disturbance 01/04/2020   Elevated CK 01/04/2020   Weakness of both lower extremities 01/04/2020   Encephalopathy    Polypharmacy    Fall 12/18/2019   Hypokalemia    Abnormal liver function test    Cervical post-laminectomy syndrome 10/21/2019   Cervical spondylosis 09/23/2019   Memory loss 07/02/2019   Depression with anxiety 07/02/2019   B12 deficiency 07/02/2019   Lumbar radiculopathy 02/10/2019   History of fusion of lumbar spine 02/10/2019   Dysesthesia 02/10/2019   Retroperitoneal fibrosis    Pelvic adhesions    Left ovarian cyst 11/17/2018   Elevated tumor markers 11/17/2018   Dyspnea 09/15/2018   Laryngopharyngeal reflux (LPR) 05/19/2018   Bipolar disorder ([place]) 04/16/2018   Insomnia 04/16/2018   Attention deficit hyperactivity disorder (ADHD) 04/16/2018   Pain in right knee 04/04/2017   Neck pain on right side 09/09/2014   Pseudoarthrosis of lumbar spine 04/15/2014   Family history of arrhythmogenic right ventricular cardiomyopathy 03/30/2013   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 02/20/2013   Sinus pause 02/14/2013   Hx of syncope- s/p Loop recorder 11/06/12 10/29/2012   Vertigo 10/29/2012   Tobacco abuse 10/29/2012   ANXIETY 11/14/2009   BENIGN POSITIONAL VERTIGO 11/14/2009   EAR PAIN, RIGHT 11/14/2009   SCIATICA 05/09/2009   PCP:  Lujean Amel, MD Pharmacy:   [id number] [place] [id number]: [phone]: [phone]    Social Determinants of Health  (SDOH) Interventions    Readmission Risk Interventions     No data to display

## 2021-11-13 NOTE — Progress Notes (Signed)
PROGRESS NOTE  [name]  DOB: 1955/12/17  PCP: Lujean Amel, MD SEG:[id number]  DOA: 11/10/2021  LOS: 1 day  Hospital Day: 4  Brief narrative: [name] is a 66 y.o. female with PMH significant for HLD, sinus pause s/p PPM,  anxiety/depression, bipolar disorder, ADHD, cervical spondylosis, degeneration of the lumbar intervertebral disc, lumbar spine fusion, history of cervical cancer. Patient reports she has a history of 'cervical cancer' several decades ago in a childbearing age and had only local resection.  She did not require any radiation or chemotherapy at that time.  3 years ago, she had recurrence of the cancer and underwent hysterectomy and oophorectomy bilaterally.  She states she saw an oncologist at our cancer center at the time. 8/7-8/16/2023, she was hospitalized at [facility] in [place] while she was visiting there.  Per history, she initially presented with seizure-like activity and started on Depakote.  Continues video EEG showed bilateral and focal cerebral dysfunction and focal cortical irritability which improved with AED.  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  She was initiated on Lovenox and subsequently started on Coumadin with plan to continue both until INR was therapeutic.  It seems there was a concern for antiphospholipid syndrome because of which Coumadin and not DOAC were chosen.  On the day of discharge on 8/16, INR was repeated at 2.65.  It seems, despite therapeutic INR level she was discharged on both Lovenox and Coumadin.  It seems patient was mistakenly taking Coumadin twice a day. 8/18, she followed up with her PCP and was noted to have an INR of 5.6.  Per chart review, clinic was  unable to reach the patient or her daughter with the lab result.  Finally on 8/22, patient was directed to ED, noted to have INR more than 10 but did not have any active bleeding at the time.  She was given IV vitamin K 2.5 mg, observed overnight with significant improvement in INR to 2.1 and was subsequently discharged on 8/23.  8/25, patient returned to the ED with complaint of blood in urine.  In the ED, patient was hemodynamically stable, breathing on room air Labs showed INR elevated to 11.3, hemoglobin at 12 Urinalysis showed cloudy brown color urine with moderate amount of hemoglobin, more than 50 RBCs, trace leukocytes, positive nitrate, few bacteria. Patient was given 1 dose of IV vitamin K 2.5 mg. Admitted to hospital service  Subjective: Patient was seen and examined this afternoon.  Pleasant.  Not in distress pulmonary symptoms.  Hematuria improving. Underwent CT chest, abdomen and pelvis today.  Assessment and plan: Gross hematuria Presented with hematuria due to supratherapeutic INR Urinalysis as above with more than 50 RBCs CT renal study was done which just showed a 2 mm left renal calculus. No evidence of ureteral calculi or hydronephrosis. No radiographic evidence of urinary tract neoplasm. Urology consult appreciated. Hematuria improving.  Supratherapeutic INR It seems patient was mistakenly taking Coumadin twice a day INR at presentation was 11.3.  1 dose of IV vitamin K 2.5 mg was given with improvement in Coumadin to therapeutic range.  Patient was not given Coumadin yesterday.  INR up to 4 today again. Pharmacy monitoring INR and dosing Coumadin. I reviewed the chart from [place]  in care everywhere.  She had elevated cardiolipin and antibody raising suspicion of antiphospholipid syndrome and hence DOAC cannot be used. Recent Labs  Lab 11/08/21 0411 11/10/21 2259 11/11/21 0833 11/12/21 0517 11/13/21 0426  INR 2.1* 11.3* 2.6* 2.9* 4.0*    Recent diagnosis  of multi venous thrombosis During her hospitalization 8/78/16 in [place], she was also found to have lobar, segmental, subsegmental PE within the right lung, left lower extremity DVT as well as nonocclusive dural venous sinus thrombosis involving the left transverse sinus and sigmoid sinus extending into the proximal left internal jugular vein.   Also reports recent 12 pound weight loss. Given her history of cervical cancer 3 years ago requiring hysterectomy and nephrectomy 3 years ago, may need further hematology evaluation to rule out recurrence of cancer.  Will discuss with oncologist. I obtained a CT Chest, abdomen pelvis today which did not show any suspicious focus of primary malignancy or metastasis.  UTI Urine culture is growing Klebsiella  initiated on Rocephin1 g IVPB daily  Hypokalemia Potassium low at 3 at presentation.  Was 2.9 yesterday.  Replaced.  Repeat Recent Labs  Lab 11/07/21 1647 11/08/21 0411 11/10/21 2259 11/11/21 0833 11/13/21 0426  K 3.5 2.8* 3.0* 2.9* 4.3  MG  --  2.0  --  1.8 2.3  PHOS  --   --   --   --  3.5   Recently diagnosed seizure Per report, she had seizure during a hospitalization recently in [place] and was started on Depakote. Continue with Depakote 250 mg p.o. twice daily.  Aortic atherosclerosis Hyperlipidemia Would benefit from statin therapy.  Defer to primary.    Multiple psychiatric issues Bipolar disorder, anxiety/depression, ADHD Continue risperidone 3 mg p.o. at bedtime, Xanax as needed Follow-up with behavioral health and PCP.  Chronic daily smoker  smoking cessation advised.  Goals of care   Code Status: Full Code    Mobility: Encourage ambulation  Skin assessment:     Nutritional status:  Body mass index is 15.82 kg/m.          Diet:  Diet Order             Diet Heart Room service appropriate? Yes; Fluid consistency: Thin  Diet effective now                   DVT prophylaxis:  SCDs Start:  11/11/21 0835   Antimicrobials: IV Rocephin, INR not in range Fluid: None Consultants: We will call oncology Family Communication: None at bedside  Status is: Inpatient  Continue in-hospital care because: Needs further monitoring for hematuria, INR monitoring as she seems to have brittle INR despite being on low-dose of Coumadin Level of care: Progressive   Dispo: The patient is from: Home              Anticipated d/c is to: Home              Patient currently is not medically stable to d/c.   Difficult to place patient No     Infusions:   cefTRIAXone (ROCEPHIN)  IV Stopped (11/13/21 1006)    Scheduled Meds:  divalproex  250 mg Oral TID   pantoprazole  40 mg Oral Daily   risperiDONE  3 mg Oral QHS   sodium chloride (PF)        PRN meds: acetaminophen **OR** acetaminophen, ALPRAZolam, nicotine, ondansetron **OR** ondansetron (ZOFRAN) IV, sodium chloride (PF)   Antimicrobials: Anti-infectives (From admission, onward)    Start  Dose/Rate Route Frequency Ordered Stop   11/12/21 1000  cefTRIAXone (ROCEPHIN) 1 g in sodium chloride 0.9 % 100 mL IVPB        1 g 200 mL/hr over 30 Minutes Intravenous Daily 11/11/21 0833     11/11/21 0730  cefTRIAXone (ROCEPHIN) 1 g in sodium chloride 0.9 % 100 mL IVPB        1 g 200 mL/hr over 30 Minutes Intravenous  Once 11/11/21 0725 11/11/21 0845       Objective: Vitals:   11/13/21 0436 11/13/21 1456  BP: 104/69 114/76  Pulse: 79 80  Resp: 20 18  Temp: 98.3 F (36.8 C) (!) 97.5 F (36.4 C)  SpO2: 96% 98%    Intake/Output Summary (Last 24 hours) at 11/13/2021 1532 Last data filed at 11/13/2021 1006 Gross per 24 hour  Intake 100 ml  Output 251 ml  Net -151 ml   Filed Weights   11/10/21 2246  Weight: 45.8 kg   Weight change:  Body mass index is 15.82 kg/m.   Physical Exam: General exam: Pleasant, middle-aged Caucasian female.  Not in physical distress Skin: No rashes, lesions or ulcers. HEENT: Atraumatic,  normocephalic, no obvious bleeding Lungs: Clear to auscultation bilaterally CVS: Regular rate and rhythm, no murmur GI/Abd soft, nontender, nondistended, bowel sound present CNS: Alert, awake, oriented x3 Psychiatry: Mood appropriate Extremities: No pedal edema, no calf tenderness  Data Review: I have personally reviewed the laboratory data and studies available.  F/u labs ordered Unresulted Labs (From admission, onward)     Start     Ordered   11/12/21 0500  Protime-INR  Daily at 5am,   R      11/11/21 0102            Signed, Terrilee Croak, MD [facility] 11/13/2021

## 2021-11-13 NOTE — Telephone Encounter (Signed)
Scheduled appt per 8/22 referral. Pt is aware of appt date and time. Pt is aware to arrive 15 mins prior to appt time and to bring and updated insurance card. Pt is aware of appt location.   

## 2021-11-13 NOTE — Care Management Obs Status (Signed)
[place] NOTIFICATION   Patient Details  Name: [name] MRN: [id number] Date of Birth: 07-10-1955   Medicare Observation Status Notification Given:  Yes    MahabirJuliann Pulse, RN 11/13/2021, 10:27 AM

## 2021-11-14 DIAGNOSIS — R310 Gross hematuria: Secondary | ICD-10-CM | POA: Diagnosis not present

## 2021-11-14 LAB — PROTIME-INR
INR: 3.8 — ABNORMAL HIGH (ref 0.8–1.2)
Prothrombin Time: 36.8 seconds — ABNORMAL HIGH (ref 11.4–15.2)

## 2021-11-14 MED ORDER — CEPHALEXIN 500 MG PO CAPS: 500.0000 mg | ORAL_CAPSULE | Freq: Two times a day (BID) | ORAL | 0 refills | Status: AC

## 2021-11-14 MED ORDER — WARFARIN - PHARMACIST DOSING INPATIENT: Freq: Every day | Status: DC

## 2021-11-14 MED ORDER — CEPHALEXIN 500 MG PO CAPS
500.0000 mg | ORAL_CAPSULE | Freq: Two times a day (BID) | ORAL | Status: DC
  Administered 2021-11-14: 500 mg via ORAL
  Filled 2021-11-14: qty 1

## 2021-11-14 NOTE — Discharge Summary (Signed)
Physician Discharge Summary  [name] ATF:[id number] DOB: April 05, 1955 DOA: 11/10/2021  PCP: Lujean Amel, MD  Admit date: 11/10/2021 Discharge date: 11/14/2021  Admitted From: Home Discharge disposition: Home  Recommendations at discharge:  Your INR is still higher than the target.  Do not restart Coumadin yet.  Follow-up with PCP in next 1 to 2 days for repeat INR.  Your primary care provider will decide when to resume Coumadin. Oral antibiotics for next 3 days for urinary tract infection Continue other medicines   Brief narrative: [name] is a 66 y.o. female with PMH significant for HLD, sinus pause s/p PPM,  anxiety/depression, bipolar disorder, ADHD, cervical spondylosis, degeneration of the lumbar intervertebral disc, lumbar spine fusion, history of cervical cancer. Patient reports she has a history of 'cervical cancer' several decades ago in a childbearing age and had only local resection.  She did not require any radiation or chemotherapy at that time.  3 years ago, she had recurrence of the cancer and underwent hysterectomy and oophorectomy bilaterally.  She states she saw an oncologist at our cancer center at the time. 8/7-8/16/2023, she was hospitalized at [facility] in [place] while she was visiting there.  Per history, she initially presented with seizure-like activity and started on Depakote.  Continues video EEG showed bilateral and focal cerebral dysfunction and focal cortical irritability which improved with AED.  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  She was initiated on Lovenox and subsequently started on Coumadin with plan to continue both until INR was therapeutic.  It seems there was a concern  for antiphospholipid syndrome because of which Coumadin and not DOAC were chosen.  On the day of discharge on 8/16, INR was repeated at 2.65.  It seems, despite therapeutic INR level she was discharged on both Lovenox and Coumadin.  It seems patient was mistakenly taking Coumadin twice a day. 8/18, she followed up with her PCP and was noted to have an INR of 5.6.  Per chart review, clinic was unable to reach the patient or her daughter with the lab result.  Finally on 8/22, patient was directed to ED, noted to have INR more than 10 but did not have any active bleeding at the time.  She was given IV vitamin K 2.5 mg, observed overnight with significant improvement in INR to 2.1 and was subsequently discharged on 8/23.  8/25, patient returned to the ED with complaint of blood in urine.  In the ED, patient was hemodynamically stable, breathing on room air Labs showed INR elevated to 11.3, hemoglobin at 12 Urinalysis showed cloudy brown color urine with moderate amount of hemoglobin, more than 50 RBCs, trace leukocytes, positive nitrate, few bacteria. Patient was given 1 dose of IV vitamin K 2.5 mg. Admitted to hospital service  Subjective: Patient was seen and examined this morning.  Pleasant.  Not in distress.  Hematuria improved.  INR 3.8 today which is lower than 4 yesterday.  Hospital course : Gross hematuria Presented with hematuria due to supratherapeutic INR Urinalysis as above with more than 50 RBCs CT renal study was done which just showed a 2 mm left renal calculus. No evidence of ureteral calculi or hydronephrosis. No radiographic evidence of urinary tract neoplasm. Urology consult appreciated. Hematuria  improved.  Supratherapeutic INR INR at presentation was 11.3.  1 dose of IV vitamin K 2.5 mg was given with improvement to 2.6.  Coumadin was kept on hold.  Despite that, INR gradually trended up to a peak of 4.  Slightly better at 3.8 today.  Coumadin continues to remain on hold.   I  called and discussed with patient's PCP Dr. [name] today.  At discharge, will plan to hold Coumadin.  I have advised patient and her daughter about that.  She will follow up with PCP's office tomorrow for repeat INR.  Based on her heightened sensitivity to Coumadin, she may probably benefit from resumption of Coumadin at low-dose of 1 mg daily in next few days. I reviewed the chart from [place] in care everywhere.  She had elevated cardiolipin and antibody raising suspicion of antiphospholipid syndrome and hence DOAC cannot be used. PCP in process to arrange an appointment with hematology as an outpatient. 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   Also reports recent 12 pound weight loss. 8/28 I obtained a CT Chest, abdomen pelvis today which did not show any suspicious focus of primary malignancy or metastasis.  UTI Urine culture is grew Klebsiella  Currently improving on Rocephin1 g IVPB daily.  Switched to oral Keflex for next 3 days.  Hypokalemia Potassium low at 3 at presentation.  Was 2.9 yesterday.  Replaced.  Repeat Recent Labs  Lab 11/07/21 1647 11/08/21 0411 11/10/21 2259 11/11/21 0833 11/13/21 0426  K 3.5 2.8* 3.0* 2.9* 4.3  MG  --  2.0  --  1.8 2.3  PHOS  --   --   --   --  3.5   Recently diagnosed seizure Per report, she had seizure during a hospitalization recently in [place] and was started on Depakote. Continue with Depakote 250 mg p.o. twice daily.  Aortic atherosclerosis Hyperlipidemia Would benefit from statin  therapy.  Defer to primary.    Multiple psychiatric issues Bipolar disorder, anxiety/depression, ADHD Continue risperidone 3 mg p.o. at bedtime, Xanax as needed Follow-up with behavioral health and PCP.  Chronic daily smoker  smoking cessation advised.  Wounds:  - Wound / Incision (Open or Dehisced) 05/04/21 Laceration Axilla Left stage 2 laceration with bruising around open area (Active)  Date First Assessed/Time First Assessed: 05/04/21 1330   Wound Type: Laceration  Location: Axilla  Location Orientation: Left  Wound Description (Comments): stage 2 laceration with bruising around open area  Present on Admission: Yes    Assessments 05/04/2021  1:00 PM 05/07/2021  8:19 PM  Dressing Type Foam - Lift dressing to assess site every shift Foam - Lift dressing to assess site every shift  Dressing Changed New New  Dressing Status Clean, Dry, Intact Clean, Dry, Intact  Site / Wound Assessment -- Painful  Wound Length (cm) 4 cm --  Wound Width (cm) 3 cm --  Wound Surface Area (cm^2) 12 cm^2 --  Drainage Amount Scant None  Drainage Description Serosanguineous Serosanguineous     No associated orders.     Wound / Incision (Open or  Dehisced) 05/04/21 Other (Comment) Pretibial Left;Proximal (Active)  Date First Assessed/Time First Assessed: 05/04/21 1330   Wound Type: Other (Comment)  Location: Pretibial  Location Orientation: Left;Proximal    Assessments 05/04/2021  1:00 PM 05/07/2021 11:00 PM  Dressing Type Foam - Lift dressing to assess site every shift Foam - Lift dressing to assess site every shift  Dressing Changed New Changed  Dressing Status Clean, Dry, Intact Old drainage  Dressing Change Frequency -- Every 3 days  Site / Wound Assessment Clean;Dry Painful     No associated orders.     Wound / Incision (Open or Dehisced) 05/04/21 Other (Comment) Shoulder Left Abrasion (Active)  Date First Assessed/Time First Assessed: 05/04/21 1729   Wound Type: Other (Comment)  Location: Shoulder   Location Orientation: Left  Wound Description (Comments): Abrasion  Present on Admission: Yes    Assessments 05/04/2021  5:31 PM 05/07/2021 11:00 PM  Dressing Type Foam - Lift dressing to assess site every shift Foam - Lift dressing to assess site every shift  Dressing Changed New --  Dressing Status Clean, Dry, Intact Old drainage  Dressing Change Frequency Every 3 days Every 3 days  Site / Wound Assessment -- Painful  Peri-wound Assessment Erythema (non-blanchable) --  Wound Length (cm) 4 cm --  Wound Width (cm) 3 cm --  Wound Depth (cm) 0.2 cm --  Wound Volume (cm^3) 2.4 cm^3 --  Wound Surface Area (cm^2) 12 cm^2 --  Drainage Amount Scant --  Drainage Description Serosanguineous --  Non-staged Wound Description Partial thickness --  Treatment Cleansed --     No associated orders.     Wound / Incision (Open or Dehisced) 05/04/21 Other (Comment) Wrist Anterior;Left Left wrist (Active)  Date First Assessed/Time First Assessed: 05/04/21 1734   Wound Type: Other (Comment)  Location: Wrist  Location Orientation: Anterior;Left  Wound Description (Comments): Left wrist  Present on Admission: Yes    Assessments 05/04/2021  5:35 PM 05/07/2021 11:00 PM  Dressing Type Foam - Lift dressing to assess site every shift Foam - Lift dressing to assess site every shift  Dressing Changed New --  Dressing Status Clean, Dry, Intact Clean, Dry, Intact  Dressing Change Frequency Every 3 days Every 3 days  Site / Wound Assessment -- Painful  Wound Length (cm) 1 cm --  Wound Width (cm) 1 cm --  Wound Surface Area (cm^2) 1 cm^2 --     No associated orders.     Wound / Incision (Open or Dehisced) 05/04/21 Other (Comment) Nose Bridge of nose (Active)  Date First Assessed/Time First Assessed: 05/04/21 1736   Wound Type: Other (Comment)  Location: Nose  Wound Description (Comments): Bridge of nose  Present on Admission: Yes    Assessments 05/05/2021  8:00 AM 05/07/2021 11:00 PM  Dressing Type None None  Site  / Wound Assessment Clean;Dry --     No associated orders.     Wound / Incision (Open or Dehisced) 05/04/21 Other (Comment) Eye Left Left orbital area is edematous and has abrasions (Active)  Date First Assessed/Time First Assessed: 05/04/21 1738   Wound Type: Other (Comment)  Location: Eye  Location Orientation: Left  Wound Description (Comments): Left orbital area is edematous and has abrasions  Present on Admission: Yes    Assessments 05/05/2021  8:00 AM 05/07/2021 11:00 PM  Dressing Type None None  Site / Wound Assessment Clean;Dry --  Closure Sutures Sutures  Drainage Amount None --     No associated orders.  Discharge Exam:   Vitals:   11/13/21 0436 11/13/21 1456 11/13/21 2051 11/14/21 0446  BP: 104/69 114/76 109/68 103/63  Pulse: 79 80 90 84  Resp: '20 18 20 18  '$ Temp: 98.3 F (36.8 C) (!) 97.5 F (36.4 C) 98.4 F (36.9 C) 98.2 F (36.8 C)  TempSrc: Oral Oral Oral Oral  SpO2: 96% 98% 97% 98%  Weight:      Height:        Body mass index is 15.82 kg/m.   General exam: Pleasant, middle-aged Caucasian female.  Not in physical distress Skin: No rashes, lesions or ulcers. HEENT: Atraumatic, normocephalic, no obvious bleeding Lungs: Clear to auscultation bilaterally CVS: Regular rate and rhythm, no murmur GI/Abd soft, nontender, nondistended, bowel sound present CNS: Alert, awake, oriented x3 Psychiatry: Mood appropriate Extremities: No pedal edema, no calf tenderness  Follow ups:    Follow-up Information     [facility]. Go on 11/17/2021.   Why: INR(coumadin) check @ 9:30a Contact information: [address] [place] [address]        [name], Dibas, MD Follow up.   Specialty: Family Medicine Contact information: [place] [address] [phone]                 Discharge Instructions:   Discharge Instructions     Call MD for:  difficulty breathing, headache or visual  disturbances   Complete by: As directed    Call MD for:  extreme fatigue   Complete by: As directed    Call MD for:  hives   Complete by: As directed    Call MD for:  persistant dizziness or light-headedness   Complete by: As directed    Call MD for:  persistant nausea and vomiting   Complete by: As directed    Call MD for:  severe uncontrolled pain   Complete by: As directed    Call MD for:  temperature >100.4   Complete by: As directed    Diet general   Complete by: As directed    Discharge instructions   Complete by: As directed    Recommendations at discharge:   Your INR is still higher than the target.  Do not restart Coumadin yet.  Follow-up with PCP in next 1 to 2 days for repeat INR.  Your primary care provider will decide when to resume Coumadin.  Oral antibiotics for next 3 days for urinary tract infection  Continue other medicines  General discharge instructions: Follow with Primary MD [name], [place]ibas, MD in 7 days  Please request your PCP  to go over your hospital tests, procedures, radiology results at the follow up. Please get your medicines reviewed and adjusted.  Your PCP may decide to repeat certain labs or tests as needed. Do not drive, operate heavy machinery, perform activities at heights, swimming or participation in water activities or provide baby sitting services if your were admitted for syncope or siezures until you have seen by Primary MD or a Neurologist and advised to do so again. [place] Controlled Substance Reporting System database was reviewed. Do not drive, operate heavy machinery, perform activities at heights, swim, participate in water activities or provide baby-sitting services while on medications for pain, sleep and mood until your outpatient physician has reevaluated you and advised to do so again.  You are strongly recommended to comply with the dose, frequency and duration of prescribed medications. Activity: As tolerated with Full fall  precautions use  walker/cane & assistance as needed Avoid using any recreational substances like cigarette, tobacco, alcohol, or non-prescribed drug. If you experience worsening of your admission symptoms, develop shortness of breath, life threatening emergency, suicidal or homicidal thoughts you must seek medical attention immediately by calling 911 or calling your MD immediately  if symptoms less severe. You must read complete instructions/literature along with all the possible adverse reactions/side effects for all the medicines you take and that have been prescribed to you. Take any new medicine only after you have completely understood and accepted all the possible adverse reactions/side effects.  Wear Seat belts while driving. You were cared for by a hospitalist during your hospital stay. If you have any questions about your discharge medications or the care you received while you were in the hospital after you are discharged, you can call the unit and ask to speak with the hospitalist or the covering physician. Once you are discharged, your primary care physician will handle any further medical issues. Please note that NO REFILLS for any discharge medications will be authorized once you are discharged, as it is imperative that you return to your primary care physician (or establish a relationship with a primary care physician if you do not have one).   Increase activity slowly   Complete by: As directed        Discharge Medications:   Allergies as of 11/14/2021       Reactions   Antibiotic Ear [neomycin-polymyxin-hc] Other (See Comments)   Unknown reaction   Penicillins Other (See Comments)   Yeast infection Did it involve swelling of the face/tongue/throat, SOB, or low BP? No Did it involve sudden or severe rash/hives, skin peeling, or any reaction on the inside of your mouth or nose? No Did you need to seek medical attention at a hospital or doctor's office? Yes When did it last  happen? More than 5 years ago  If all above answers are "NO", may proceed with cephalosporin use.        Medication List     STOP taking these medications    acetaminophen 325 MG tablet Commonly known as: TYLENOL   potassium chloride SA 20 MEQ tablet Commonly known as: KLOR-CON M   warfarin 5 MG tablet Commonly known as: COUMADIN       TAKE these medications    ALPRAZolam 1 MG tablet Commonly known as: Xanax Take 1 tablet (1 mg total) by mouth 3 (three) times daily as needed for anxiety. Must last at least 30 days. What changed:  how much to take when to take this   amitriptyline 25 MG tablet Commonly known as: ELAVIL Take 2 tablets (50 mg total) by mouth at bedtime. What changed:  how much to take when to take this reasons to take this   cephALEXin 500 MG capsule Commonly known as: KEFLEX Take 1 capsule (500 mg total) by mouth every 12 (twelve) hours for 3 days.   DayVigo 10 MG Tabs Generic drug: Lemborexant Take 10 mg by mouth at bedtime.   divalproex 250 MG DR tablet Commonly known as: DEPAKOTE Take 250 mg by mouth in the morning, at noon, and at bedtime.   hydrOXYzine 50 MG tablet Commonly known as: ATARAX TAKE 1-2 TABLETS (50-100 MG TOTAL) BY MOUTH AT BEDTIME AS NEEDED. What changed:  how much to take reasons to take this   ondansetron 8 MG tablet Commonly known as: ZOFRAN Take 8 mg by mouth 2 (two) times daily as needed for nausea/vomiting.  Oxycodone HCl 10 MG Tabs Take 10 mg by mouth every 8 (eight) hours as needed for pain.   risperiDONE 3 MG tablet Commonly known as: RISPERDAL TAKE 1 TABLET BY MOUTH AT BEDTIME.   tiZANidine 4 MG tablet Commonly known as: ZANAFLEX Take 4 mg by mouth every 8 (eight) hours as needed for muscle spasms.         The results of significant diagnostics from this hospitalization (including imaging, microbiology, ancillary and laboratory) are listed below for reference.    Procedures and Diagnostic  Studies:   CT HEMATURIA WORKUP  Result Date: 11/11/2021 CLINICAL DATA:  Hematuria. EXAM: CT ABDOMEN AND PELVIS WITHOUT AND WITH CONTRAST TECHNIQUE: Multidetector CT imaging of the abdomen and pelvis was performed following the standard protocol before and following the bolus administration of intravenous contrast. RADIATION DOSE REDUCTION: This exam was performed according to the departmental dose-optimization program which includes automated exposure control, adjustment of the mA and/or kV according to patient size and/or use of iterative reconstruction technique. CONTRAST:  162m OMNIPAQUE IOHEXOL 300 MG/ML  SOLN COMPARISON:  11/07/2019 FINDINGS: Lower Chest: No acute findings. Hepatobiliary: No hepatic masses identified. A few tiny calcified gallstones are noted in the fundus, however there is no evidence of cholecystitis or biliary ductal dilatation. Pancreas:  No mass or inflammatory changes. Spleen: Within normal limits in size and appearance. Adrenals/Urinary Tract: No adrenal masses identified. A 2 mm calculus is noted in the lower pole of the left kidney. No evidence of ureteral calculi or hydronephrosis. No suspicious renal masses identified. No masses seen involving the collecting systems, ureters, or bladder. Stomach/Bowel: No evidence of obstruction, inflammatory process or abnormal fluid collections. Normal appendix visualized. Vascular/Lymphatic: No pathologically enlarged lymph nodes. No acute vascular findings. Aortic atherosclerotic calcification incidentally noted. Reproductive: Prior hysterectomy noted. Adnexal regions are unremarkable in appearance. Other:  None. Musculoskeletal: No suspicious bone lesions identified. Prior fusion again noted at L4-5. Subacute, healing fracture of the right pubic bone incidentally noted. IMPRESSION: 2 mm left renal calculus. No evidence of ureteral calculi or hydronephrosis. No radiographic evidence of urinary tract neoplasm. Cholelithiasis. No radiographic  evidence of cholecystitis. Subacute, healing fracture of right pubic bone. Aortic Atherosclerosis (ICD10-I70.0). Electronically Signed   By: JMarlaine HindM.D.   On: 11/11/2021 17:49     Labs:   Basic Metabolic Panel: Recent Labs  Lab 11/07/21 1647 11/08/21 0411 11/10/21 2259 11/11/21 0833 11/13/21 0426  NA 145 143 144 141 141  K 3.5 2.8* 3.0* 2.9* 4.3  CL 108 110 110 109 110  CO2 '25 25 25 24 24  '$ GLUCOSE 91 92 87 87 101*  BUN '9 10 12 10 11  '$ CREATININE 0.75 0.64 0.79 0.67 0.77  CALCIUM 8.9 8.1* 8.8* 8.0* 8.6*  MG  --  2.0  --  1.8 2.3  PHOS  --   --   --   --  3.5   GFR Estimated Creatinine Clearance: 51.4 mL/min (by C-G formula based on SCr of 0.77 mg/dL). Liver Function Tests: Recent Labs  Lab 11/10/21 2259  AST 11*  ALT 9  ALKPHOS 85  BILITOT 0.4  PROT 6.7  ALBUMIN 3.7   No results for input(s): "LIPASE", "AMYLASE" in the last 168 hours. No results for input(s): "AMMONIA" in the last 168 hours. Coagulation profile Recent Labs  Lab 11/10/21 2259 11/11/21 0833 11/12/21 0517 11/13/21 0426 11/14/21 0532  INR 11.3* 2.6* 2.9* 4.0* 3.8*    CBC: Recent Labs  Lab 11/07/21 1647 11/08/21 0411 11/10/21 2259  11/11/21 0833 11/12/21 0517 11/13/21 0426  WBC 10.5 7.8 12.2* 8.8  --  7.8  NEUTROABS  --   --  7.8*  --   --  3.8  HGB 12.3 11.0* 12.0 10.7* 10.9* 10.6*  HCT 37.7 34.1* 38.2 33.1*  --  33.7*  MCV 86.7 90.2 91.2 89.7  --  90.8  PLT 254 195 232 191  --  202   Cardiac Enzymes: No results for input(s): "CKTOTAL", "CKMB", "CKMBINDEX", "TROPONINI" in the last 168 hours. BNP: Invalid input(s): "POCBNP" CBG: No results for input(s): "GLUCAP" in the last 168 hours. D-Dimer No results for input(s): "DDIMER" in the last 72 hours. Hgb A1c No results for input(s): "HGBA1C" in the last 72 hours. Lipid Profile No results for input(s): "CHOL", "HDL", "LDLCALC", "TRIG", "CHOLHDL", "LDLDIRECT" in the last 72 hours. Thyroid function studies No results for  input(s): "TSH", "T4TOTAL", "T3FREE", "THYROIDAB" in the last 72 hours.  Invalid input(s): "FREET3" Anemia work up No results for input(s): "VITAMINB12", "FOLATE", "FERRITIN", "TIBC", "IRON", "RETICCTPCT" in the last 72 hours. Microbiology Recent Results (from the past 240 hour(s))  Urine Culture     Status: Abnormal   Collection Time: 11/11/21  7:26 AM   Specimen: Urine, Clean Catch  Result Value Ref Range Status   Specimen Description   Final    URINE, CLEAN CATCH Performed at [facility], [place] [address] [place], [place] [address]    Special Requests   Final    NONE Performed at [facility], [place] [address], [place], [place] [address]    Culture >=100,000 COLONIES/mL KLEBSIELLA PNEUMONIAE (A)  Final   Report Status 11/13/2021 FINAL  Final   Organism ID, Bacteria KLEBSIELLA PNEUMONIAE (A)  Final      Susceptibility   Klebsiella pneumoniae - MIC*    AMPICILLIN >=32 RESISTANT Resistant     CEFAZOLIN <=4 SENSITIVE Sensitive     CEFEPIME <=0.12 SENSITIVE Sensitive     CEFTRIAXONE <=0.25 SENSITIVE Sensitive     CIPROFLOXACIN <=0.25 SENSITIVE Sensitive     GENTAMICIN <=1 SENSITIVE Sensitive     IMIPENEM <=0.25 SENSITIVE Sensitive     NITROFURANTOIN 32 SENSITIVE Sensitive     TRIMETH/SULFA <=20 SENSITIVE Sensitive     AMPICILLIN/SULBACTAM 8 SENSITIVE Sensitive     PIP/TAZO <=4 SENSITIVE Sensitive     * >=100,000 COLONIES/mL KLEBSIELLA PNEUMONIAE    Time coordinating discharge: 35 minutes  Signed: Jatara Huettner  [facility] 11/14/2021, 1:23 PM

## 2021-11-14 NOTE — Progress Notes (Signed)
[place] for warfarin Indication:  VTE treatment  Allergies  Allergen Reactions   Antibiotic Ear [Neomycin-Polymyxin-Hc] Other (See Comments)    Unknown reaction   Penicillins Other (See Comments)    Yeast infection Did it involve swelling of the face/tongue/throat, SOB, or low BP? No Did it involve sudden or severe rash/hives, skin peeling, or any reaction on the inside of your mouth or nose? No Did you need to seek medical attention at a hospital or doctor's office? Yes When did it last happen? More than 5 years ago  If all above answers are "NO", may proceed with cephalosporin use.     Patient Measurements: Height: '5\' 7"'$  (170.2 cm) Weight: 45.8 kg (101 lb) IBW/kg (Calculated) : 61.6  Vital Signs: Temp: 98.2 F (36.8 C) (08/29 0446) Temp Source: Oral (08/29 0446) BP: 103/63 (08/29 0446) Pulse Rate: 84 (08/29 0446)  Labs: Recent Labs    11/11/21 0833 11/12/21 0517 11/13/21 0426 11/14/21 0532  HGB 10.7* 10.9* 10.6*  --   HCT 33.1*  --  33.7*  --   PLT 191  --  202  --   LABPROT 27.9* 30.3* 38.3* 36.8*  INR 2.6* 2.9* 4.0* 3.8*  CREATININE 0.67  --  0.77  --      Estimated Creatinine Clearance: 51.4 mL/min (by C-G formula based on SCr of 0.77 mg/dL).   Medical History: Past Medical History:  Diagnosis Date   Anxiety    anxiety and mild depressive order systoms   Bipolar disorder ([place])     (02/20/2013)   Cervical cancer ([place]) 03/19/1980   Cholelithiasis 11/11/2021   Depression    Dysrhythmia    Headache(784.0)    "monthly" (02/20/2013)   Hyperlipidemia 11/11/2021   Pacemaker    medtronic   PONV (postoperative nausea and vomiting)    Sinus pause 02/14/2013   Syncope and collapse    echo-April 12,2012-EF 55% Echo normal; recorder with prolonged sinus pauses --> status post   Tobacco abuse    Vertigo     Medications: Warfarin PTA -Last dose: 8/25  Assessment: Pt is a 8 yoF who was recently started on warfarin.  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 Appears there was also concern for antiphospholipid syndrome. Pt was discharged on 11/01/21 on warfarin with enoxaparin bridge.   Patient had elevated INR (5.6) on outpatient follow up on 8/18 but clinic was unable to reach patient or daughter regarding results. Pt then presented to the ED on 8/22 with INR >10.   Pt reported accidentally taking warfarin TWICE daily for several days during this time period in addition to being on enoxaparin bridge. Pt received vitamin K and was discharged on 8/23. She presented back to the ED on 8/25 PM with hematuria and supratherapeutic INR and reports taking warfarin once daily as prescribed.   Pharmacy consulted to monitor INR and dose warfarin.   Significant Events: -11/01/21: Discharged from hospital in [place] on warfarin + enoxaparin bridge. INR = 2.65 at OSH in [place] -11/03/21: INR = 5.6 at OSH in [place] -11/07/21: INR >10 in ED. Vitamin K 2.5 mg IV once -11/08/21: INR 2.1. Discharged on warfarin 5 mg PO daily -11/10/21: INR 11.3. Vitamin K 2.5 mg IV once  Today, 11/14/21 INR = 3.8, supratherapeutic CBC stable Hematuria  resolving  Goal of Therapy:  INR 2-3 Monitor platelets by anticoagulation protocol: Yes   Plan:  Hold warfarin again today INR daily Patient would benefit from medication counseling prior to discharge.   Tawnya Crook, PharmD, BCPS Clinical Pharmacist 11/14/2021 8:09 AM

## 2021-11-14 NOTE — Progress Notes (Signed)
Mobility Specialist - Progress Note   11/14/21 0931  Mobility  Activity Ambulated with assistance in hallway  Level of Assistance Contact guard assist, steadying assist  Assistive Device None  Distance Ambulated (ft) 180 ft  Activity Response Tolerated well  $Mobility charge 1 Mobility   Pt received in bed and agreed to mobilize. Pt claimed no pain nor discomfort during ambulation, however facial expressions expressed pain. Pt left in bed with all needs met and call bell within reach. RN notified of session.   Roderick Pee Mobility Specialist

## 2021-11-14 NOTE — TOC Progression Note (Signed)
Transition of Care [facility]) - Progression Note    Patient Details  Name: [name] MRN: [id number] Date of Birth: 1955/07/09  Transition of Care [facility]) CM/SW Contact  Larell Baney, Juliann Pulse, RN Phone Number: 11/14/2021, 12:26 PM  Clinical Narrative:[place] coumadin check appt set on Friday 9:30a. No further CM needs.       Expected Discharge Plan: Home/Self Care Barriers to Discharge: No Barriers Identified  Expected Discharge Plan and Services Expected Discharge Plan: Home/Self Care   Discharge Planning Services: CM Consult Post Acute Care Choice: NA (Home) Living arrangements for the past 2 months: Apartment                 DME Arranged: N/A DME Agency: NA         HH Agency: NA         Social Determinants of Health (SDOH) Interventions    Readmission Risk Interventions    11/13/2021   10:54 AM  Readmission Risk Prevention Plan  Transportation Screening Complete  PCP or Specialist Appt within 3-5 Days Complete  HRI or [place] Complete  Social Work Consult for [place] Planning/Counseling Complete  Palliative Care Screening Not Applicable  Medication Review [employer]) Complete

## 2021-11-14 NOTE — Plan of Care (Signed)
  Problem: Clinical Measurements: Goal: Ability to maintain clinical measurements within normal limits will improve Outcome: Progressing Goal: Respiratory complications will improve Outcome: Progressing   Problem: Coping: Goal: Level of anxiety will decrease Outcome: Progressing   Problem: Elimination: Goal: Will not experience complications related to urinary retention Outcome: Progressing

## 2021-11-15 DIAGNOSIS — Z7901 Long term (current) use of anticoagulants: Secondary | ICD-10-CM | POA: Diagnosis not present

## 2021-11-16 DIAGNOSIS — Z7901 Long term (current) use of anticoagulants: Secondary | ICD-10-CM | POA: Diagnosis not present

## 2021-11-17 DIAGNOSIS — Z79899 Other long term (current) drug therapy: Secondary | ICD-10-CM | POA: Diagnosis not present

## 2021-11-17 DIAGNOSIS — Z7901 Long term (current) use of anticoagulants: Secondary | ICD-10-CM | POA: Diagnosis not present

## 2021-11-17 DIAGNOSIS — I8290 Acute embolism and thrombosis of unspecified vein: Secondary | ICD-10-CM | POA: Diagnosis not present

## 2021-11-21 ENCOUNTER — Inpatient Hospital Stay (HOSPITAL_COMMUNITY)
Admission: EM | Admit: 2021-11-21 | Discharge: 2021-11-26 | DRG: 689 | Disposition: A | Discharge status: Home / Self Care | Payer: Medicare Other | Attending: Internal Medicine | Admitting: Internal Medicine

## 2021-11-21 ENCOUNTER — Emergency Department (HOSPITAL_COMMUNITY): Payer: Medicare Other

## 2021-11-21 ENCOUNTER — Encounter (HOSPITAL_COMMUNITY): Payer: Self-pay

## 2021-11-21 DIAGNOSIS — Z20822 Contact with and (suspected) exposure to covid-19: Secondary | ICD-10-CM | POA: Diagnosis present

## 2021-11-21 DIAGNOSIS — Z7901 Long term (current) use of anticoagulants: Secondary | ICD-10-CM | POA: Diagnosis not present

## 2021-11-21 DIAGNOSIS — G9341 Metabolic encephalopathy: Secondary | ICD-10-CM | POA: Diagnosis not present

## 2021-11-21 DIAGNOSIS — R5381 Other malaise: Secondary | ICD-10-CM | POA: Diagnosis not present

## 2021-11-21 DIAGNOSIS — N3000 Acute cystitis without hematuria: Secondary | ICD-10-CM | POA: Diagnosis not present

## 2021-11-21 DIAGNOSIS — I700 Atherosclerosis of aorta: Secondary | ICD-10-CM | POA: Diagnosis present

## 2021-11-21 DIAGNOSIS — G9340 Encephalopathy, unspecified: Secondary | ICD-10-CM | POA: Diagnosis present

## 2021-11-21 DIAGNOSIS — Z743 Need for continuous supervision: Secondary | ICD-10-CM | POA: Diagnosis not present

## 2021-11-21 DIAGNOSIS — M5136 Other intervertebral disc degeneration, lumbar region: Secondary | ICD-10-CM | POA: Diagnosis not present

## 2021-11-21 DIAGNOSIS — E7220 Disorder of urea cycle metabolism, unspecified: Secondary | ICD-10-CM | POA: Diagnosis present

## 2021-11-21 DIAGNOSIS — N390 Urinary tract infection, site not specified: Secondary | ICD-10-CM

## 2021-11-21 DIAGNOSIS — F909 Attention-deficit hyperactivity disorder, unspecified type: Secondary | ICD-10-CM | POA: Diagnosis present

## 2021-11-21 DIAGNOSIS — Z79899 Other long term (current) drug therapy: Secondary | ICD-10-CM

## 2021-11-21 DIAGNOSIS — Z8701 Personal history of pneumonia (recurrent): Secondary | ICD-10-CM

## 2021-11-21 DIAGNOSIS — F17210 Nicotine dependence, cigarettes, uncomplicated: Secondary | ICD-10-CM | POA: Diagnosis present

## 2021-11-21 DIAGNOSIS — Z8541 Personal history of malignant neoplasm of cervix uteri: Secondary | ICD-10-CM

## 2021-11-21 DIAGNOSIS — F419 Anxiety disorder, unspecified: Secondary | ICD-10-CM | POA: Diagnosis present

## 2021-11-21 DIAGNOSIS — R4182 Altered mental status, unspecified: Principal | ICD-10-CM

## 2021-11-21 DIAGNOSIS — E43 Unspecified severe protein-calorie malnutrition: Secondary | ICD-10-CM | POA: Diagnosis present

## 2021-11-21 DIAGNOSIS — R791 Abnormal coagulation profile: Secondary | ICD-10-CM | POA: Diagnosis present

## 2021-11-21 DIAGNOSIS — E785 Hyperlipidemia, unspecified: Secondary | ICD-10-CM | POA: Diagnosis not present

## 2021-11-21 DIAGNOSIS — Z90711 Acquired absence of uterus with remaining cervical stump: Secondary | ICD-10-CM

## 2021-11-21 DIAGNOSIS — F418 Other specified anxiety disorders: Secondary | ICD-10-CM | POA: Diagnosis present

## 2021-11-21 DIAGNOSIS — I495 Sick sinus syndrome: Secondary | ICD-10-CM | POA: Diagnosis present

## 2021-11-21 DIAGNOSIS — Z880 Allergy status to penicillin: Secondary | ICD-10-CM | POA: Diagnosis not present

## 2021-11-21 DIAGNOSIS — Z8249 Family history of ischemic heart disease and other diseases of the circulatory system: Secondary | ICD-10-CM

## 2021-11-21 DIAGNOSIS — Z86711 Personal history of pulmonary embolism: Secondary | ICD-10-CM

## 2021-11-21 DIAGNOSIS — Z681 Body mass index (BMI) 19 or less, adult: Secondary | ICD-10-CM

## 2021-11-21 DIAGNOSIS — K219 Gastro-esophageal reflux disease without esophagitis: Secondary | ICD-10-CM | POA: Diagnosis present

## 2021-11-21 DIAGNOSIS — Z950 Presence of cardiac pacemaker: Secondary | ICD-10-CM

## 2021-11-21 DIAGNOSIS — R809 Proteinuria, unspecified: Secondary | ICD-10-CM | POA: Diagnosis not present

## 2021-11-21 DIAGNOSIS — Z981 Arthrodesis status: Secondary | ICD-10-CM

## 2021-11-21 DIAGNOSIS — R531 Weakness: Secondary | ICD-10-CM | POA: Diagnosis not present

## 2021-11-21 DIAGNOSIS — R636 Underweight: Secondary | ICD-10-CM | POA: Diagnosis present

## 2021-11-21 DIAGNOSIS — Z720 Tobacco use: Secondary | ICD-10-CM | POA: Diagnosis present

## 2021-11-21 DIAGNOSIS — Z86718 Personal history of other venous thrombosis and embolism: Secondary | ICD-10-CM

## 2021-11-21 DIAGNOSIS — F319 Bipolar disorder, unspecified: Secondary | ICD-10-CM | POA: Diagnosis present

## 2021-11-21 LAB — CBC WITH DIFFERENTIAL/PLATELET
Abs Immature Granulocytes: 0.06 10*3/uL (ref 0.00–0.07)
Basophils Absolute: 0.1 10*3/uL (ref 0.0–0.1)
Basophils Relative: 1 %
Eosinophils Absolute: 0 10*3/uL (ref 0.0–0.5)
Eosinophils Relative: 0 %
HCT: 40.2 % (ref 36.0–46.0)
Hemoglobin: 12.6 g/dL (ref 12.0–15.0)
Immature Granulocytes: 1 %
Lymphocytes Relative: 30 %
Lymphs Abs: 3.2 10*3/uL (ref 0.7–4.0)
MCH: 28.6 pg (ref 26.0–34.0)
MCHC: 31.3 g/dL (ref 30.0–36.0)
MCV: 91.4 fL (ref 80.0–100.0)
Monocytes Absolute: 1.1 10*3/uL — ABNORMAL HIGH (ref 0.1–1.0)
Monocytes Relative: 11 %
Neutro Abs: 6.1 10*3/uL (ref 1.7–7.7)
Neutrophils Relative %: 57 %
Platelets: 157 10*3/uL (ref 150–400)
RBC: 4.4 MIL/uL (ref 3.87–5.11)
RDW: 15.3 % (ref 11.5–15.5)
WBC: 10.6 10*3/uL — ABNORMAL HIGH (ref 4.0–10.5)
nRBC: 0 % (ref 0.0–0.2)

## 2021-11-21 LAB — COMPREHENSIVE METABOLIC PANEL
ALT: 7 U/L (ref 0–44)
AST: 11 U/L — ABNORMAL LOW (ref 15–41)
Albumin: 3.7 g/dL (ref 3.5–5.0)
Alkaline Phosphatase: 81 U/L (ref 38–126)
Anion gap: 6 (ref 5–15)
BUN: 9 mg/dL (ref 8–23)
CO2: 24 mmol/L (ref 22–32)
Calcium: 9 mg/dL (ref 8.9–10.3)
Chloride: 112 mmol/L — ABNORMAL HIGH (ref 98–111)
Creatinine, Ser: 0.93 mg/dL (ref 0.44–1.00)
GFR, Estimated: >60 mL/min (ref >60)
Glucose, Bld: 117 mg/dL — ABNORMAL HIGH (ref 70–99)
Potassium: 4.3 mmol/L (ref 3.5–5.1)
Sodium: 142 mmol/L (ref 135–145)
Total Bilirubin: 0.4 mg/dL (ref 0.3–1.2)
Total Protein: 6.5 g/dL (ref 6.5–8.1)

## 2021-11-21 LAB — PROTIME-INR
INR: 3.6 — ABNORMAL HIGH (ref 0.8–1.2)
Prothrombin Time: 35.2 seconds — ABNORMAL HIGH (ref 11.4–15.2)

## 2021-11-21 LAB — LIPASE, BLOOD: Lipase: 29 U/L (ref 11–51)

## 2021-11-21 NOTE — ED Triage Notes (Signed)
Pt presents via EMS from home with c/o lower abdominal pain and some leg weakness. Pt recently finished a course of antibiotics for a UTI.

## 2021-11-21 NOTE — ED Provider Triage Note (Signed)
Emergency Medicine Provider Triage Evaluation Note  DARLENA KOVAL , a 66 y.o. female  was evaluated in triage.  Pt complains of concern about INR. Patient notes her INR has not be at the correct level and she notes her daughter is concerned she may have formed a clot? She admits to visual and speech changes over the past few days. No unilateral weakness. Also endorses lower abdominal pain. Recently admitted to the hospital for gross hematuria.   Review of Systems  Positive: Speech/visual changes Negative: fever  Physical Exam  BP 125/86 (BP Location: Left Arm)   Pulse (!) 113   Temp 98.6 F (37 C) (Oral)   Resp 12   SpO2 96%  Gen:   Awake, no distress   Resp:  Normal effort  MSK:   Moves extremities without difficulty  Other:    Medical Decision Making  Medically screening exam initiated at 7:12 PM.  Appropriate orders placed.  KIERAN NACHTIGAL was informed that the remainder of the evaluation will be completed by another provider, this initial triage assessment does not replace that evaluation, and the importance of remaining in the ED until their evaluation is complete.  Labs CT head, no focal deficits on exam PT/INR   Karie Kirks 11/21/21 1914

## 2021-11-22 ENCOUNTER — Emergency Department (HOSPITAL_COMMUNITY): Payer: Medicare Other

## 2021-11-22 ENCOUNTER — Other Ambulatory Visit: Payer: Self-pay

## 2021-11-22 ENCOUNTER — Encounter (HOSPITAL_COMMUNITY): Payer: Self-pay | Admitting: Emergency Medicine

## 2021-11-22 DIAGNOSIS — G9340 Encephalopathy, unspecified: Secondary | ICD-10-CM | POA: Diagnosis present

## 2021-11-22 LAB — URINALYSIS, ROUTINE W REFLEX MICROSCOPIC
Bacteria, UA: NONE SEEN
Bacteria, UA: NONE SEEN
Bilirubin Urine: NEGATIVE
Bilirubin Urine: NEGATIVE
Glucose, UA: NEGATIVE mg/dL
Glucose, UA: NEGATIVE mg/dL
Hgb urine dipstick: NEGATIVE
Ketones, ur: NEGATIVE mg/dL
Ketones, ur: NEGATIVE mg/dL
Leukocytes,Ua: NEGATIVE
Nitrite: NEGATIVE
Nitrite: NEGATIVE
Protein, ur: 100 mg/dL — AB
Protein, ur: NEGATIVE mg/dL
Specific Gravity, Urine: 1.013 (ref 1.005–1.030)
Specific Gravity, Urine: 1.012 (ref 1.005–1.030)
pH: 7 (ref 5.0–8.0)
pH: 6 (ref 5.0–8.0)

## 2021-11-22 LAB — SARS CORONAVIRUS 2 BY RT PCR: SARS Coronavirus 2 by RT PCR: NEGATIVE

## 2021-11-22 LAB — HEMOGLOBIN AND HEMATOCRIT, BLOOD
HCT: 35.1 % — ABNORMAL LOW (ref 36.0–46.0)
Hemoglobin: 10.9 g/dL — ABNORMAL LOW (ref 12.0–15.0)

## 2021-11-22 LAB — PROTIME-INR
INR: 4.2 (ref 0.8–1.2)
Prothrombin Time: 39.9 seconds — ABNORMAL HIGH (ref 11.4–15.2)

## 2021-11-22 LAB — VALPROIC ACID LEVEL: Valproic Acid Lvl: 85 ug/mL (ref 50.0–100.0)

## 2021-11-22 MED ORDER — DIVALPROEX SODIUM 250 MG PO DR TAB
250.0000 mg | DELAYED_RELEASE_TABLET | Freq: Three times a day (TID) | ORAL | Status: DC
  Administered 2021-11-22 – 2021-11-26 (×12): 250 mg via ORAL
  Filled 2021-11-22 (×12): qty 1

## 2021-11-22 MED ORDER — NICOTINE 14 MG/24HR TD PT24
14.0000 mg | MEDICATED_PATCH | Freq: Every day | TRANSDERMAL | Status: DC
  Administered 2021-11-22 – 2021-11-26 (×5): 14 mg via TRANSDERMAL
  Filled 2021-11-22 (×5): qty 1

## 2021-11-22 MED ORDER — ALPRAZOLAM 0.5 MG PO TABS
0.5000 mg | ORAL_TABLET | Freq: Every day | ORAL | Status: DC | PRN
  Administered 2021-11-22 – 2021-11-25 (×4): 0.5 mg via ORAL
  Filled 2021-11-22 (×4): qty 1

## 2021-11-22 MED ORDER — LEMBOREXANT 10 MG PO TABS: 10.0000 mg | ORAL_TABLET | Freq: Every day | ORAL | Status: DC

## 2021-11-22 MED ORDER — SODIUM CHLORIDE 0.9 % IV SOLN: INTRAVENOUS | Status: DC

## 2021-11-22 MED ORDER — ATORVASTATIN CALCIUM 10 MG PO TABS
10.0000 mg | ORAL_TABLET | Freq: Every day | ORAL | Status: DC
  Administered 2021-11-22 – 2021-11-26 (×5): 10 mg via ORAL
  Filled 2021-11-22 (×5): qty 1

## 2021-11-22 MED ORDER — TIZANIDINE HCL 4 MG PO TABS: 4.0000 mg | ORAL_TABLET | Freq: Three times a day (TID) | ORAL | Status: DC | PRN

## 2021-11-22 MED ORDER — RISPERIDONE 1 MG PO TABS
3.0000 mg | ORAL_TABLET | Freq: Every day | ORAL | Status: DC
  Administered 2021-11-22 – 2021-11-25 (×4): 3 mg via ORAL
  Filled 2021-11-22 (×4): qty 3

## 2021-11-22 MED ORDER — DEXTROSE 5 % IV SOLN: 0.5000 g | Freq: Three times a day (TID) | INTRAVENOUS | Status: DC

## 2021-11-22 MED ORDER — AMITRIPTYLINE HCL 25 MG PO TABS: 25.0000 mg | ORAL_TABLET | Freq: Every evening | ORAL | Status: DC | PRN

## 2021-11-22 MED ORDER — ONDANSETRON HCL 4 MG/2ML IJ SOLN: 4.0000 mg | Freq: Four times a day (QID) | INTRAMUSCULAR | Status: DC | PRN

## 2021-11-22 MED ORDER — SODIUM CHLORIDE 0.9 % IV SOLN: INTRAVENOUS | Status: AC

## 2021-11-22 MED ORDER — ACETAMINOPHEN 650 MG RE SUPP: 650.0000 mg | Freq: Three times a day (TID) | RECTAL | Status: DC | PRN

## 2021-11-22 MED ORDER — ZOLPIDEM TARTRATE 5 MG PO TABS
5.0000 mg | ORAL_TABLET | Freq: Every day | ORAL | Status: DC
  Administered 2021-11-22 – 2021-11-25 (×4): 5 mg via ORAL
  Filled 2021-11-22 (×4): qty 1

## 2021-11-22 MED ORDER — SODIUM CHLORIDE 0.9 % IV SOLN
1.0000 g | Freq: Every day | INTRAVENOUS | Status: AC
  Administered 2021-11-22 – 2021-11-26 (×5): 1 g via INTRAVENOUS
  Filled 2021-11-22 (×5): qty 10

## 2021-11-22 MED ORDER — SODIUM CHLORIDE 0.9 % IV SOLN
1.0000 g | Freq: Three times a day (TID) | INTRAVENOUS | Status: DC
  Administered 2021-11-22: 1 g via INTRAVENOUS
  Filled 2021-11-22 (×2): qty 5

## 2021-11-22 MED ORDER — ONDANSETRON HCL 4 MG PO TABS: 4.0000 mg | ORAL_TABLET | Freq: Four times a day (QID) | ORAL | Status: DC | PRN

## 2021-11-22 MED ORDER — ACETAMINOPHEN 500 MG PO TABS
500.0000 mg | ORAL_TABLET | Freq: Four times a day (QID) | ORAL | Status: DC | PRN
  Administered 2021-11-24: 500 mg via ORAL
  Filled 2021-11-22: qty 1

## 2021-11-22 NOTE — ED Notes (Signed)
Pt attempting to urinate one more time before needing in and out.

## 2021-11-22 NOTE — ED Notes (Signed)
Pt provided with water to try to provide urine sample. MD aware.

## 2021-11-22 NOTE — Progress Notes (Signed)
ANTICOAGULATION CONSULT NOTE - Initial Consult  Pharmacy Consult for warfarin Indication: VTE treatment (history of PE, DVT of left peroneal vein, left jugular vein thrombosis, cerebral venous thrombosis of sigmoid sinus  Allergies  Allergen Reactions   Antibiotic Ear [Neomycin-Polymyxin-Hc] Other (See Comments)    Unknown reaction   Penicillins Other (See Comments)    Yeast infection Did it involve swelling of the face/tongue/throat, SOB, or low BP? No Did it involve sudden or severe rash/hives, skin peeling, or any reaction on the inside of your mouth or nose? No Did you need to seek medical attention at a hospital or doctor's office? Yes When did it last happen? More than 5 years ago  If all above answers are "NO", may proceed with cephalosporin use.     Patient Measurements:    Vital Signs: Temp: 98.6 F (37 C) (09/06 1327) Temp Source: Oral (09/06 1327) BP: 129/94 (09/06 1327) Pulse Rate: 76 (09/06 1327)  Labs: Recent Labs    11/21/21 1940 11/22/21 1406  HGB 12.6 10.9*  HCT 40.2 35.1*  PLT 157  --   LABPROT 35.2* 39.9*  INR 3.6* 4.2*  CREATININE 0.93  --     Estimated Creatinine Clearance: 44.2 mL/min (by C-G formula based on SCr of 0.93 mg/dL).   Medical History: Past Medical History:  Diagnosis Date   Anxiety    anxiety and mild depressive order systoms   Bipolar disorder ([place])     (02/20/2013)   Cervical cancer ([place]) 03/19/1980   Cholelithiasis 11/11/2021   Depression    Dysrhythmia    Headache(784.0)    "monthly" (02/20/2013)   Hyperlipidemia 11/11/2021   Pacemaker    medtronic   PONV (postoperative nausea and vomiting)    Sinus pause 02/14/2013   Syncope and collapse    echo-April 12,2012-EF 55% Echo normal; recorder with prolonged sinus pauses --> status post   Tobacco abuse    Vertigo     Medications:  On warfarin prior to admission;  Prescribed warfarin 5 mg, 30 day supply on 8/15 Prescribed warfarin 1 mg, 10 day supply on 8/31 Last  warfarin dose: 1 mg on 9/5 Patient stated she was told to [place] her dose after today until therapeutic dose can be figured out  Assessment: Pharmacy consulted to dose warfarin for this 66 year old female with pertinent medical history including cerebral venous thrombosis of sigmoid sinus, PE, DVT of left peroneal vein, left jugular vein thrombosis.  Patient was recently admitted 8/26-8/29 for supratherapeutic INR and is returned 9/5 to the emergency department due to hematuria. She stated she has been taking the warfarin only once a day now.  Today's INR remains supra-therapeutic at 4.2.  Goal of Therapy:  Anticoagulation Monitor platelets by anticoagulation protocol: Yes   Plan:  Continue to hold warfarin today Daily PT & INR Monitor CBC, signs/symptoms of bleeding   Suzzanne Cloud, PharmD, BCPS 11/22/2021,4:12 PM

## 2021-11-22 NOTE — Progress Notes (Signed)
[place] admitting physician addendum:  Pharmacy has notified me that the patient has had ceftriaxone in the past without significant side effects.  Aztreonam has been discontinue and ceftriaxone started.  Tennis Must, MD.

## 2021-11-22 NOTE — Progress Notes (Signed)
Daughter came out requesting we get a CT with contrast due to increasing altered mental status. Paged provider and informed that note was in chart.

## 2021-11-22 NOTE — ED Provider Notes (Signed)
[facility] DEPT Provider Note   CSN: [id number] Arrival date & time: 11/21/21  1850     History  Chief Complaint  Patient presents with   Extremity Weakness   Abdominal Pain    [name] is a 66 y.o. female.  The history is provided by the patient.  Illness Location:  At home Quality:  Altered mental status.  Patient tells me her family is concerned as she was in bed with food waste and had not cleaned up and she does not know how long she was like that.  Is also concerned about her INR because she was taken off coumadin reportedly secondary to being supratherapeutic. 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       Home Medications Prior to Admission medications   Medication Sig Start Date End Date Taking? Authorizing Provider  ALPRAZolam Duanne Moron) 1 MG tablet Take 1 tablet (1 mg total) by mouth 3 (three) times daily as needed for anxiety. Must last at least 30 days. Patient taking differently: Take 0.5 mg by mouth daily as needed for anxiety. Must last at least 30 days. 10/17/21  Yes Donnal Moat T, PA-C  amitriptyline (ELAVIL) 25 MG tablet Take 2 tablets (50 mg total) by mouth at bedtime. Patient taking differently: Take 25 mg by mouth at bedtime as needed for sleep. 10/27/21   Donnal Moat T, PA-C  divalproex (DEPAKOTE) 250 MG DR tablet Take 250 mg by mouth in the morning, at noon, and at bedtime. 11/01/21   [provider]  hydrOXYzine (ATARAX) 50 MG tablet TAKE 1-2 TABLETS (50-100 MG TOTAL) BY MOUTH AT BEDTIME AS NEEDED. Patient taking differently:  Take 50 mg by mouth at bedtime as needed for anxiety (sleep). 03/27/21   Hurst, Dorothea Glassman, PA-C  Lemborexant (DAYVIGO) 10 MG TABS Take 10 mg by mouth at bedtime.    [provider]  ondansetron (ZOFRAN) 8 MG tablet Take 8 mg by mouth 2 (two) times daily as needed for nausea/vomiting. 10/16/21   [provider]  Oxycodone HCl 10 MG TABS Take 10 mg by mouth every 8 (eight) hours as needed for pain. 09/07/21   [provider]  risperiDONE (RISPERDAL) 3 MG tablet TAKE 1 TABLET BY MOUTH AT BEDTIME. 09/29/21   Hurst, Teresa T, PA-C  tiZANidine (ZANAFLEX) 4 MG tablet Take 4 mg by mouth every 8 (eight) hours as needed for muscle spasms.    [provider]      Allergies    Antibiotic ear [neomycin-polymyxin-hc] and Penicillins    Review of Systems   Review of Systems  Constitutional:  Negative for fever.  HENT:  Negative for facial swelling.   Eyes:  Negative for redness.  Respiratory:  Negative for cough and wheezing.   Cardiovascular:  Negative for chest pain and leg swelling.  Gastrointestinal:  Negative for abdominal pain and vomiting.  All other systems reviewed and are negative.   Physical Exam Updated Vital Signs BP 124/82   Pulse 84   Temp 98.2 F (36.8 C) (Oral)   Resp 16   SpO2 97%  Physical Exam  Constitutional:      General: She is not in acute distress.    Appearance: Normal appearance. She is well-developed.  HENT:     Head: Normocephalic and atraumatic.     Nose: Nose normal.  Eyes:     Pupils: Pupils are equal, round, and reactive to light.  Cardiovascular:     Rate and Rhythm: Normal rate and regular rhythm.     Pulses: Normal pulses.     Heart sounds: Normal heart sounds.  Pulmonary:     Effort: Pulmonary effort is normal. No respiratory distress.     Breath sounds: Normal breath sounds. No wheezing or rales.  Abdominal:     General: Abdomen is flat. Bowel sounds are normal. There is no distension.     Palpations: Abdomen is soft.      Tenderness: There is no abdominal tenderness. There is no guarding or rebound.  Genitourinary:    Vagina: No vaginal discharge.  Musculoskeletal:        General: Normal range of motion.     Cervical back: Normal range of motion and neck supple.  Skin:    General: Skin is warm and dry.     Capillary Refill: Capillary refill takes less than 2 seconds.     Findings: No erythema or rash.  Neurological:     General: No focal deficit present.     Mental Status: She is alert and oriented to person, place, and time.     Deep Tendon Reflexes: Reflexes normal.  Psychiatric:        Mood and Affect: Mood normal.        Behavior: Behavior normal.     ED Results / Procedures / Treatments   Labs (all labs ordered are listed, but only abnormal results are displayed) Results for orders placed or performed during the hospital encounter of 11/21/21  SARS Coronavirus 2 by RT PCR (hospital order, performed in [facility] lab) *cepheid single result test* Anterior Nasal Swab   Specimen: Anterior Nasal Swab  Result Value Ref Range   SARS Coronavirus 2 by RT PCR NEGATIVE NEGATIVE  CBC with Differential  Result Value Ref Range   WBC 10.6 (H) 4.0 - 10.5 K/uL   RBC 4.40 3.87 - 5.11 MIL/uL   Hemoglobin 12.6 12.0 - 15.0 g/dL   HCT 40.2 36.0 - 46.0 %   MCV 91.4 80.0 - 100.0 fL   MCH 28.6 26.0 - 34.0 pg   MCHC 31.3 30.0 - 36.0 g/dL   RDW 15.3 11.5 - 15.5 %   Platelets 157 150 - 400 K/uL   nRBC 0.0 0.0 - 0.2 %   Neutrophils Relative % 57 %   Neutro Abs 6.1 1.7 - 7.7 K/uL   Lymphocytes Relative 30 %   Lymphs Abs 3.2 0.7 - 4.0 K/uL   Monocytes Relative 11 %   Monocytes Absolute 1.1 (H) 0.1 - 1.0 K/uL   Eosinophils Relative 0 %   Eosinophils Absolute 0.0 0.0 - 0.5 K/uL   Basophils Relative 1 %   Basophils Absolute 0.1 0.0 - 0.1 K/uL   Immature Granulocytes 1 %   Abs Immature Granulocytes 0.06 0.00 - 0.07 K/uL  Comprehensive metabolic panel  Result Value Ref Range   Sodium 142 135 -  145 mmol/L   Potassium 4.3 3.5 - 5.1 mmol/L   Chloride 112 (H) 98 - 111 mmol/L   CO2 24 22 - 32 mmol/L   Glucose, Bld 117 (H) 70 - 99 mg/dL  BUN 9 8 - 23 mg/dL   Creatinine, Ser 0.93 0.44 - 1.00 mg/dL   Calcium 9.0 8.9 - 10.3 mg/dL   Total Protein 6.5 6.5 - 8.1 g/dL   Albumin 3.7 3.5 - 5.0 g/dL   AST 11 (L) 15 - 41 U/L   ALT 7 0 - 44 U/L   Alkaline Phosphatase 81 38 - 126 U/L   Total Bilirubin 0.4 0.3 - 1.2 mg/dL   GFR, Estimated >60 >60 mL/min   Anion gap 6 5 - 15  Lipase, blood  Result Value Ref Range   Lipase 29 11 - 51 U/L  Urinalysis, Routine w reflex microscopic Urine, Clean Catch  Result Value Ref Range   Color, Urine YELLOW YELLOW   APPearance HAZY (A) CLEAR   Specific Gravity, Urine 1.013 1.005 - 1.030   pH 7.0 5.0 - 8.0   Glucose, UA NEGATIVE NEGATIVE mg/dL   Hgb urine dipstick NEGATIVE NEGATIVE   Bilirubin Urine NEGATIVE NEGATIVE   Ketones, ur NEGATIVE NEGATIVE mg/dL   Protein, ur 100 (A) NEGATIVE mg/dL   Nitrite NEGATIVE NEGATIVE   Leukocytes,Ua MODERATE (A) NEGATIVE   Bacteria, UA NONE SEEN NONE SEEN  Protime-INR  Result Value Ref Range   Prothrombin Time 35.2 (H) 11.4 - 15.2 seconds   INR 3.6 (H) 0.8 - 1.2   DG Chest Portable 1 View  Result Date: 11/22/2021 CLINICAL DATA:  Altered mental status EXAM: PORTABLE CHEST 1 VIEW COMPARISON:  None Available. FINDINGS: Lungs are clear. No pneumothorax or pleural effusion. Cardiac size within normal limits on this semi erect examination. Left subclavian dual lead pacemaker is unchanged. Pulmonary vascularity is normal. No acute bone abnormality. IMPRESSION: No active disease. Electronically Signed   By: Fidela Salisbury M.D.   On: 11/22/2021 02:59   CT Head Wo Contrast  Result Date: 11/21/2021 CLINICAL DATA:  Mental status change, unknown cause EXAM: CT HEAD WITHOUT CONTRAST TECHNIQUE: Contiguous axial images were obtained from the base of the skull through the vertex without intravenous contrast. RADIATION DOSE  REDUCTION: This exam was performed according to the departmental dose-optimization program which includes automated exposure control, adjustment of the mA and/or kV according to patient size and/or use of iterative reconstruction technique. COMPARISON:  Head CT 05/03/2021 FINDINGS: Brain: No intracranial hemorrhage, mass effect, or midline shift. Stable degree of atrophy. No hydrocephalus. The basilar cisterns are patent. No evidence of territorial infarct or acute ischemia. No extra-axial or intracranial fluid collection. Vascular: Atherosclerosis of skullbase vasculature without hyperdense vessel or abnormal calcification. Skull: No fracture or focal lesion. Sinuses/Orbits: Paranasal sinuses and mastoid air cells are clear. The visualized orbits are unremarkable. Other: None. IMPRESSION: No acute intracranial abnormality. Electronically Signed   By: Keith Rake M.D.   On: 11/21/2021 20:45   CT CHEST ABDOMEN PELVIS W CONTRAST  Result Date: 11/13/2021 CLINICAL DATA:  History of uterine/cervical cancer status post total hysterectomy. Evaluate for tumor recurrence. * Tracking Code: BO * EXAM: CT CHEST, ABDOMEN, AND PELVIS WITH CONTRAST TECHNIQUE: Multidetector CT imaging of the chest, abdomen and pelvis was performed following the standard protocol during bolus administration of intravenous contrast. RADIATION DOSE REDUCTION: This exam was performed according to the departmental dose-optimization program which includes automated exposure control, adjustment of the mA and/or kV according to patient size and/or use of iterative reconstruction technique. CONTRAST:  168m OMNIPAQUE IOHEXOL 300 MG/ML  SOLN COMPARISON:  Chest CT 02/21/2020.  Abdominopelvic CT 11/11/2021. FINDINGS: CT CHEST FINDINGS Cardiovascular: No acute vascular findings. Left subclavian pacemaker leads  extend into the right atrium and right ventricle. Mild aortic atherosclerosis. The heart size is normal. There is no pericardial effusion.  Mediastinum/Nodes: There are no enlarged mediastinal, hilar or axillary lymph nodes. The thyroid gland, trachea and esophagus demonstrate no significant findings. Lungs/Pleura: Trace bilateral pleural effusions. No pneumothorax. Mild centrilobular emphysema. The aeration of the left lower lobe has improved from the prior examination. There are new non focal patchy ground-glass opacities peripherally in the right upper lobe which are probably inflammatory. Angulated density peripherally in the right middle lobe measuring 1.3 x 0.5 cm on image 81/7 is new, but favored to reflect focal atelectasis or scarring. No suspicious pulmonary nodules. Musculoskeletal/Chest wall: No chest wall mass or suspicious osseous findings. Mild spondylosis. CT ABDOMEN AND PELVIS FINDINGS Hepatobiliary: The liver is normal in density without suspicious focal abnormality. Small calcified gallstones. No gallbladder wall thickening or biliary dilatation. Pancreas: Unremarkable. No pancreatic ductal dilatation or surrounding inflammatory changes. Spleen: Normal in size without focal abnormality. Adrenals/Urinary Tract: Both adrenal glands appear normal. Previously demonstrated tiny calculus in the lower pole of the left kidney is not well seen on this contrast enhanced study. No evidence of ureteral calculus or hydronephrosis. No renal mass. The bladder appears normal for its degree of distention. Stomach/Bowel: Enteric contrast was administered and has passed into the mid colon. The stomach appears unremarkable for its degree of distension. No evidence of bowel wall thickening, distention or surrounding inflammatory change. The appendix appears normal. Vascular/Lymphatic: There are no enlarged abdominal or pelvic lymph nodes. Mild aortic and branch vessel atherosclerosis without aneurysm or large vessel occlusion. Reproductive: Hysterectomy.  No adnexal mass. Other: No ascites or peritoneal nodularity. Musculoskeletal: No acute or significant  osseous findings. Previous L4-5 fusion. Healing fracture of the right inferior pubic ramus. IMPRESSION: 1. No evidence of metastatic disease within the chest, abdomen or pelvis. 2. No acute abdominal findings or change from recent abdominopelvic CT. 3. New patchy non focal ground-glass opacity peripherally in the right upper lobe, likely inflammatory. Consider viral infection. 4. Stable incidental findings, including cholelithiasis, postsurgical changes and Aortic Atherosclerosis (ICD10-I70.0). Electronically Signed   By: Richardean Sale M.D.   On: 11/13/2021 12:10   CT HEMATURIA WORKUP  Result Date: 11/11/2021 CLINICAL DATA:  Hematuria. EXAM: CT ABDOMEN AND PELVIS WITHOUT AND WITH CONTRAST TECHNIQUE: Multidetector CT imaging of the abdomen and pelvis was performed following the standard protocol before and following the bolus administration of intravenous contrast. RADIATION DOSE REDUCTION: This exam was performed according to the departmental dose-optimization program which includes automated exposure control, adjustment of the mA and/or kV according to patient size and/or use of iterative reconstruction technique. CONTRAST:  146m OMNIPAQUE IOHEXOL 300 MG/ML  SOLN COMPARISON:  11/07/2019 FINDINGS: Lower Chest: No acute findings. Hepatobiliary: No hepatic masses identified. A few tiny calcified gallstones are noted in the fundus, however there is no evidence of cholecystitis or biliary ductal dilatation. Pancreas:  No mass or inflammatory changes. Spleen: Within normal limits in size and appearance. Adrenals/Urinary Tract: No adrenal masses identified. A 2 mm calculus is noted in the lower pole of the left kidney. No evidence of ureteral calculi or hydronephrosis. No suspicious renal masses identified. No masses seen involving the collecting systems, ureters, or bladder. Stomach/Bowel: No evidence of obstruction, inflammatory process or abnormal fluid collections. Normal appendix visualized.  Vascular/Lymphatic: No pathologically enlarged lymph nodes. No acute vascular findings. Aortic atherosclerotic calcification incidentally noted. Reproductive: Prior hysterectomy noted. Adnexal regions are unremarkable in appearance. Other:  None. Musculoskeletal: No suspicious  bone lesions identified. Prior fusion again noted at L4-5. Subacute, healing fracture of the right pubic bone incidentally noted. IMPRESSION: 2 mm left renal calculus. No evidence of ureteral calculi or hydronephrosis. No radiographic evidence of urinary tract neoplasm. Cholelithiasis. No radiographic evidence of cholecystitis. Subacute, healing fracture of right pubic bone. Aortic Atherosclerosis (ICD10-I70.0). Electronically Signed   By: Marlaine Hind M.D.   On: 11/11/2021 17:49   DG Chest 2 View  Result Date: 11/07/2021 CLINICAL DATA:  Tachypnea. EXAM: CHEST - 2 VIEW COMPARISON:  11/05/2021 FINDINGS: Normal sized heart. 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 Clear lungs with normal vascularity. Cervical spine fixation hardware. Thoracic spine degenerative changes. IMPRESSION: No acute abnormality. Electronically Signed   By: Claudie Revering M.D.   On: 11/07/2021 17:10     EKG None  Radiology DG Chest Portable 1 View  Result Date: 11/22/2021 CLINICAL DATA:  Altered mental status EXAM: PORTABLE CHEST 1 VIEW COMPARISON:  None Available. FINDINGS: Lungs are clear. No pneumothorax or pleural effusion. Cardiac size within normal limits on this semi erect examination. Left subclavian dual lead pacemaker is unchanged. Pulmonary vascularity is normal. No acute bone abnormality. IMPRESSION: No active disease. Electronically Signed   By: Fidela Salisbury M.D.   On: 11/22/2021 02:59   CT Head Wo Contrast  Result Date: 11/21/2021 CLINICAL DATA:  Mental status change, unknown cause EXAM: CT HEAD WITHOUT CONTRAST TECHNIQUE: Contiguous axial images were obtained from the base of the skull through the vertex without intravenous  contrast. RADIATION DOSE REDUCTION: This exam was performed according to the departmental dose-optimization program which includes automated exposure control, adjustment of the mA and/or kV according to patient size and/or use of iterative reconstruction technique. COMPARISON:  Head CT 05/03/2021 FINDINGS: Brain: No intracranial hemorrhage, mass effect, or midline shift. Stable degree of atrophy. No hydrocephalus. The basilar cisterns are patent. No evidence of territorial infarct or acute ischemia. No extra-axial or intracranial fluid collection. Vascular: Atherosclerosis of skullbase vasculature without hyperdense vessel or abnormal calcification. Skull: No fracture or focal lesion. Sinuses/Orbits: Paranasal sinuses and mastoid air cells are clear. The visualized orbits are unremarkable. Other: None. IMPRESSION: No acute intracranial abnormality. Electronically Signed   By: Keith Rake M.D.   On: 11/21/2021 20:45    Procedures Procedures    Medications Ordered in ED Medications  aztreonam (AZACTAM) 1 g in sodium chloride 0.9 % 100 mL IVPB (1 g Intravenous New Bag/Given 11/22/21 0423)  0.9 %  sodium chloride infusion (has no administration in time range)    ED Course/ Medical Decision Making/ A&P                           Medical Decision Making Patient with h/o PE who was on lovenox and coumadin and then taken off coumadin per her report secondary to elevated INR presents with altered mental status   Amount and/or Complexity of Data Reviewed External Data Reviewed: notes.    Details: Previous ED and admission notes reviewed.   Labs: ordered.    Details: All labs reviewed:  urine is consistent with UTI.  White count slightly elevated 10.6, normal hemoglobin and platelet count.  Normal sodium 142 and potassium 4.2  and creatinine .93.  Elevated INR 3.6.  lipase normal 29. Normal LFTs Radiology: ordered and independent interpretation performed.    Details: Negative chest Xray and CT head by  me   Risk Prescription drug management. Decision regarding hospitalization. Risk Details: Likely secondary to  UTI.  Patient is still anticoagulated on coumadin.      Final Clinical Impression(s) / ED Diagnoses Final diagnoses:  Altered mental status, unspecified altered mental status type  Urinary tract infection without hematuria, site unspecified   The patient appears reasonably stabilized for admission considering the current resources, flow, and capabilities available in the ED at this time, and I doubt any other [facility] requiring further screening and/or treatment in the ED prior to admission.  Rx / DC Orders ED Discharge Orders     None         Angeliz Settlemyre, MD 11/22/21 [phone]

## 2021-11-22 NOTE — ED Notes (Signed)
Breakfast tray given. °

## 2021-11-22 NOTE — H&P (Signed)
History and Physical    Patient: [name] KDT:[id number] DOB: 1956-02-15 DOA: 11/21/2021 DOS: the patient was seen and examined on 11/22/2021 PCP: Lujean Amel, MD  Patient coming from: Home  Chief Complaint:  Chief Complaint  Patient presents with   Extremity Weakness   Abdominal Pain   HPI: [name] is a 66 y.o. female with medical history significant of anxiety, depression, bipolar disorder, ADHD, headaches, cerebral venous thrombosis of sigmoid sinus, diagnosed with PE/DVT of left peroneal vein/left jugular vein thrombosis at a [place] facility about a month ago, cervical spondylosis, degeneration of the lumbar intervertebral disc, lumbar spine fusion, dysphagia, dysphagia, history of rhabdomyolysis, history of healthcare associated pneumonia, sepsis due to pneumonia, hypoglycemia laryngopharyngeal reflux left ovarian cyst, memory loss, myositis, right knee pain, lower extremity weakness, retroperitoneal fibrosis sciatica, seizure-like activity, sinus pauses, history of pacemaker placement who was recently admitted and discharged for supratherapeutic INR twice in the past 2 weeks who is brought to the emergency department due to being found by her daughter with food waste in bed, had not cleaned up for the last 2 days, confusion associated with lower abdominal pain and bilateral lower extremity weakness.  No flank pain at home, but positive during examination.  She denied dysuria, frequency or hematuria. No fever, chills or night sweats. No sore throat, rhinorrhea, dyspnea, wheezing or hemoptysis.  No chest pain, palpitations, diaphoresis, PND, orthopnea or pitting edema of the lower extremities.  Her appetite has improved, no nausea or emesis, diarrhea, constipation, melena or hematochezia.   No polyuria, polydipsia, polyphagia or blurred vision.  ED course: Initial vital signs were temperature 98.6 F, pulse 113, respiration 12, BP 125/86 mmHg O2 sat 96% on room air.  The patient  was started on aztreonam 1 g every 8 hours.  Lab work: Her urinalysis with moderate leukocyte esterase and proteinuria.  White count 10.6, hemoglobin 12.6 g/dL platelets 157.  PT 35.2 and INR 3.6.  Coronavirus PCR was negative.  Lipase was normal.  CMP showed a chloride of 112 mmol/L, glucose 117 mg/dL and AST decreased at 11 units/L.  The rest of the CMP measurements were unremarkable.  Valproic acid level was within therapeutic range.  Imaging: One-view portable chest radiograph showed no active disease.  Cardiac size is within normal limits.  CT head was contrast with no acute intracranial abnormality.   Review of Systems: As mentioned in the history of present illness. All other systems reviewed and are negative.  Past Medical History:  Diagnosis Date   Anxiety    anxiety and mild depressive order systoms   Bipolar disorder ([place])     (02/20/2013)   Cervical cancer ([place]) 03/19/1980   Cholelithiasis 11/11/2021   Depression    Dysrhythmia    Headache(784.0)    "monthly" (02/20/2013)   Hyperlipidemia 11/11/2021   Pacemaker    medtronic   PONV (postoperative nausea and vomiting)    Sinus pause 02/14/2013   Syncope and collapse    echo-April 12,2012-EF 55% Echo normal; recorder with prolonged sinus pauses --> status post   Tobacco abuse    Vertigo    Past Surgical History:  Procedure Laterality Date   ABDOMINAL HYSTERECTOMY  03/19/1980   "partial"   BACK SURGERY     CERVICAL FUSION Left 11/2013   C4-C6   CERVICAL FUSION     3,4,5,   INSERT / REPLACE / REMOVE PACEMAKER  02/20/2013   medtronic   LOOP RECORDER IMPLANT N/A 11/06/2012   Procedure: LINQ LOOP  RECORDER IMPLANT;  Surgeon: Sanda Klein, MD;  Location[facility] CATH LAB;  Service: Cardiovascular;  Laterality: N/A;   NECK SURGERY  11/17/2013   PACEMAKER PLACEMENT N/A 02/16/2013   PERMANENT PACEMAKER INSERTION N/A 02/20/2013   Procedure: PERMANENT PACEMAKER INSERTION;  Surgeon: Sanda Klein, MD;  Location: [place] CATH LAB;  Service:  Cardiovascular;  Laterality: N/A;   POSTERIOR LUMBAR FUSION  ~ 2009   ROBOTIC ASSISTED SALPINGO OOPHERECTOMY Bilateral 11/25/2018   Procedure: XI ROBOTIC ASSISTED BILATERAL SALPINGO OOPHORECTOMY and LYSIS OF ADHESIONS.;  Surgeon: Everitt Amber, MD;  Location: [facility] ORS;  Service: Gynecology;  Laterality: Bilateral;   SPINAL FUSION  03/19/2014   TONSILLECTOMY     TRANSTHORACIC ECHOCARDIOGRAM  07/11/2010   The left atrial size is normal.There is no evidence of mitral vavle prolaspe.Right ventriicular systolic pressure is normal . Injection of contrast documented no interatrial shunt. Essentially normal 2D echo -doppler study    Social History:  reports that she has been smoking cigarettes. She has a 30.00 pack-year smoking history. She has never used smokeless tobacco. She reports that she does not drink alcohol and does not use drugs.  Allergies  Allergen Reactions   Antibiotic Ear [Neomycin-Polymyxin-Hc] Other (See Comments)    Unknown reaction   Penicillins Other (See Comments)    Yeast infection Did it involve swelling of the face/tongue/throat, SOB, or low BP? No Did it involve sudden or severe rash/hives, skin peeling, or any reaction on the inside of your mouth or nose? No Did you need to seek medical attention at a hospital or doctor's office? Yes When did it last happen? More than 5 years ago  If all above answers are "NO", may proceed with cephalosporin use.     Family History  Adopted: Yes  Problem Relation Age of Onset   Heart attack Brother     Prior to Admission medications   Medication Sig Start Date End Date Taking? Authorizing Provider  ALPRAZolam Duanne Moron) 1 MG tablet Take 1 tablet (1 mg total) by mouth 3 (three) times daily as needed for anxiety. Must last at least 30 days. Patient taking differently: Take 0.5 mg by mouth daily as needed for anxiety. Must last at least 30 days. 10/17/21  Yes Donnal Moat T, PA-C  amitriptyline (ELAVIL) 25 MG tablet Take 2 tablets (50 mg  total) by mouth at bedtime. Patient taking differently: Take 25 mg by mouth at bedtime as needed (for pain). 10/27/21  Yes Donnal Moat T, PA-C  divalproex (DEPAKOTE) 250 MG DR tablet Take 250 mg by mouth in the morning, at noon, and at bedtime. 11/01/21  Yes [provider]  hydrOXYzine (ATARAX) 50 MG tablet TAKE 1-2 TABLETS (50-100 MG TOTAL) BY MOUTH AT BEDTIME AS NEEDED. Patient taking differently: Take 50 mg by mouth at bedtime as needed for anxiety (sleep). 03/27/21  Yes Hurst, Teresa T, PA-C  Lemborexant (DAYVIGO) 10 MG TABS Take 10 mg by mouth at bedtime.   Yes [provider]  ondansetron (ZOFRAN) 8 MG tablet Take 8 mg by mouth 2 (two) times daily as needed for nausea/vomiting. 10/16/21  Yes [provider]  Oxycodone HCl 10 MG TABS Take 10 mg by mouth daily as needed for pain. 09/07/21  Yes [provider]  risperiDONE (RISPERDAL) 3 MG tablet TAKE 1 TABLET BY MOUTH AT BEDTIME. 09/29/21  Yes Hurst, Teresa T, PA-C  tiZANidine (ZANAFLEX) 4 MG tablet Take 4 mg by mouth every 8 (eight) hours as needed for muscle spasms.   Yes [provider]  warfarin (COUMADIN) 1 MG tablet Take 1 mg by mouth daily. 11/16/21  Yes [provider]    Physical Exam: Vitals:   11/22/21 0043 11/22/21 0351 11/22/21 0510 11/22/21 0600  BP: 114/79 124/82  126/89  Pulse: 92 84  77  Resp: '16 16  16  '$ Temp: 98.2 F (36.8 C)  98.5 F (36.9 C)   TempSrc: Oral  Oral   SpO2: 95% 97%  97%   Physical Exam Vitals and nursing note reviewed.  Constitutional:      General: She is awake. She is not in acute distress.    Appearance: She is well-developed and underweight.  HENT:     Head: Normocephalic.     Nose: No rhinorrhea.     Mouth/Throat:     Mouth: Mucous membranes are moist.  Eyes:     General: No scleral icterus.    Pupils: Pupils are equal, round, and reactive to light.  Neck:     Vascular: No JVD.  Cardiovascular:     Rate and Rhythm: Normal rate and  regular rhythm.     Heart sounds: S1 normal and S2 normal.  Pulmonary:     Effort: Pulmonary effort is normal.     Breath sounds: Normal breath sounds. No wheezing, rhonchi or rales.  Abdominal:     General: Bowel sounds are normal. There is no distension.     Palpations: Abdomen is soft.     Tenderness: There is no abdominal tenderness. There is right CVA tenderness and left CVA tenderness.  Musculoskeletal:     Cervical back: Neck supple.     Right lower leg: No edema.     Left lower leg: No edema.  Skin:    General: Skin is warm and dry.  Neurological:     General: No focal deficit present.     Mental Status: She is alert and oriented to person, place, and time.  Psychiatric:        Mood and Affect: Mood normal.        Behavior: Behavior normal. Behavior is cooperative.   Data Reviewed:  There are no new results to review at this time.  Assessment and Plan: Principal Problem:   Acute cystitis Presenting with:   Acute encephalopathy Has had some flank pain. However, no fever, dysuria or frequency. No microscopic examination on urine analysis. We will repeat urinalysis. Check urine culture and sensitivity. Continue aztreonam 1 g every 8 hours.  Active Problems:   Supratherapeutic INR   History of pulmonary embolism Warfarin per pharmacy. Daily PT/INR monitoring.    Tobacco abuse NicoDerm 14 mg p.o. daily. Encouraged cessation. Staff to provide tobacco cessation information.    Depression with anxiety   Bipolar disorder (HCC) Continue amitriptyline at bedtime. Continue Depakote 250 mg p.o. 3 times daily. Continue risperidone at bedtime. Continue alprazolam as needed.    Degeneration of lumbar intervertebral disc Continue tizanidine 4 mg every 8 hours as needed.    Underweight Protein supplementation. Consider nutritional services evaluation.    Aortic atherosclerosis (HCC)   Hyperlipidemia We will start low-dose atorvastatin. Briefly discussed statin  most common side effects. Patient to follow with PCP. Smoking cessation advised.      Advance Care Planning:   Code Status: Full Code   Consults:   Family Communication:   Severity of Illness: The appropriate patient status for this patient is OBSERVATION. Observation status is judged to be reasonable and necessary in order to provide the required intensity of service  to ensure the patient's safety. The patient's presenting symptoms, physical exam findings, and initial radiographic and laboratory data in the context of their medical condition is felt to place them at decreased risk for further clinical deterioration. Furthermore, it is anticipated that the patient will be medically stable for discharge from the hospital within 2 midnights of admission.   Author: Reubin Milan, MD 11/22/2021 8:46 AM  For on call review [email or web address].   This document was prepared using Dragon voice recognition software and may contain some unintended transcription errors.

## 2021-11-23 DIAGNOSIS — I495 Sick sinus syndrome: Secondary | ICD-10-CM | POA: Diagnosis present

## 2021-11-23 DIAGNOSIS — N3000 Acute cystitis without hematuria: Secondary | ICD-10-CM | POA: Diagnosis present

## 2021-11-23 DIAGNOSIS — F419 Anxiety disorder, unspecified: Secondary | ICD-10-CM | POA: Diagnosis present

## 2021-11-23 DIAGNOSIS — Z981 Arthrodesis status: Secondary | ICD-10-CM | POA: Diagnosis not present

## 2021-11-23 DIAGNOSIS — F319 Bipolar disorder, unspecified: Secondary | ICD-10-CM | POA: Diagnosis present

## 2021-11-23 DIAGNOSIS — G9341 Metabolic encephalopathy: Secondary | ICD-10-CM | POA: Diagnosis present

## 2021-11-23 DIAGNOSIS — E785 Hyperlipidemia, unspecified: Secondary | ICD-10-CM | POA: Diagnosis present

## 2021-11-23 DIAGNOSIS — Z7901 Long term (current) use of anticoagulants: Secondary | ICD-10-CM | POA: Diagnosis not present

## 2021-11-23 DIAGNOSIS — Z8541 Personal history of malignant neoplasm of cervix uteri: Secondary | ICD-10-CM | POA: Diagnosis not present

## 2021-11-23 DIAGNOSIS — Z8249 Family history of ischemic heart disease and other diseases of the circulatory system: Secondary | ICD-10-CM | POA: Diagnosis not present

## 2021-11-23 DIAGNOSIS — E43 Unspecified severe protein-calorie malnutrition: Secondary | ICD-10-CM | POA: Diagnosis present

## 2021-11-23 DIAGNOSIS — R809 Proteinuria, unspecified: Secondary | ICD-10-CM | POA: Diagnosis present

## 2021-11-23 DIAGNOSIS — F17210 Nicotine dependence, cigarettes, uncomplicated: Secondary | ICD-10-CM | POA: Diagnosis present

## 2021-11-23 DIAGNOSIS — Z950 Presence of cardiac pacemaker: Secondary | ICD-10-CM | POA: Diagnosis not present

## 2021-11-23 DIAGNOSIS — Z79899 Other long term (current) drug therapy: Secondary | ICD-10-CM | POA: Diagnosis not present

## 2021-11-23 DIAGNOSIS — F909 Attention-deficit hyperactivity disorder, unspecified type: Secondary | ICD-10-CM | POA: Diagnosis present

## 2021-11-23 DIAGNOSIS — Z86711 Personal history of pulmonary embolism: Secondary | ICD-10-CM | POA: Diagnosis not present

## 2021-11-23 DIAGNOSIS — Z880 Allergy status to penicillin: Secondary | ICD-10-CM | POA: Diagnosis not present

## 2021-11-23 DIAGNOSIS — G9340 Encephalopathy, unspecified: Secondary | ICD-10-CM | POA: Diagnosis not present

## 2021-11-23 DIAGNOSIS — Z681 Body mass index (BMI) 19 or less, adult: Secondary | ICD-10-CM | POA: Diagnosis not present

## 2021-11-23 DIAGNOSIS — Z90711 Acquired absence of uterus with remaining cervical stump: Secondary | ICD-10-CM | POA: Diagnosis not present

## 2021-11-23 DIAGNOSIS — E7220 Disorder of urea cycle metabolism, unspecified: Secondary | ICD-10-CM | POA: Diagnosis present

## 2021-11-23 DIAGNOSIS — I700 Atherosclerosis of aorta: Secondary | ICD-10-CM | POA: Diagnosis present

## 2021-11-23 DIAGNOSIS — Z20822 Contact with and (suspected) exposure to covid-19: Secondary | ICD-10-CM | POA: Diagnosis present

## 2021-11-23 DIAGNOSIS — M5136 Other intervertebral disc degeneration, lumbar region: Secondary | ICD-10-CM | POA: Diagnosis present

## 2021-11-23 LAB — COMPREHENSIVE METABOLIC PANEL
ALT: 6 U/L (ref 0–44)
AST: 9 U/L — ABNORMAL LOW (ref 15–41)
Albumin: 2.9 g/dL — ABNORMAL LOW (ref 3.5–5.0)
Alkaline Phosphatase: 67 U/L (ref 38–126)
Anion gap: 3 — ABNORMAL LOW (ref 5–15)
BUN: 11 mg/dL (ref 8–23)
CO2: 25 mmol/L (ref 22–32)
Calcium: 8.4 mg/dL — ABNORMAL LOW (ref 8.9–10.3)
Chloride: 116 mmol/L — ABNORMAL HIGH (ref 98–111)
Creatinine, Ser: 0.79 mg/dL (ref 0.44–1.00)
GFR, Estimated: >60 mL/min (ref >60)
Glucose, Bld: 94 mg/dL (ref 70–99)
Potassium: 4 mmol/L (ref 3.5–5.1)
Sodium: 144 mmol/L (ref 135–145)
Total Bilirubin: 0.6 mg/dL (ref 0.3–1.2)
Total Protein: 5.2 g/dL — ABNORMAL LOW (ref 6.5–8.1)

## 2021-11-23 LAB — CBC
HCT: 34.4 % — ABNORMAL LOW (ref 36.0–46.0)
Hemoglobin: 10.6 g/dL — ABNORMAL LOW (ref 12.0–15.0)
MCH: 28.7 pg (ref 26.0–34.0)
MCHC: 30.8 g/dL (ref 30.0–36.0)
MCV: 93.2 fL (ref 80.0–100.0)
Platelets: 114 10*3/uL — ABNORMAL LOW (ref 150–400)
RBC: 3.69 MIL/uL — ABNORMAL LOW (ref 3.87–5.11)
RDW: 15.1 % (ref 11.5–15.5)
WBC: 6.3 10*3/uL (ref 4.0–10.5)
nRBC: 0 % (ref 0.0–0.2)

## 2021-11-23 LAB — VITAMIN B12: Vitamin B-12: 732 pg/mL (ref 180–914)

## 2021-11-23 LAB — TSH: TSH: 0.673 u[IU]/mL (ref 0.350–4.500)

## 2021-11-23 LAB — PROTIME-INR
INR: 2.5 — ABNORMAL HIGH (ref 0.8–1.2)
Prothrombin Time: 26.6 seconds — ABNORMAL HIGH (ref 11.4–15.2)

## 2021-11-23 LAB — AMMONIA: Ammonia: 131 umol/L — ABNORMAL HIGH (ref 9–35)

## 2021-11-23 MED ORDER — WARFARIN 0.5 MG HALF TABLET
0.5000 mg | ORAL_TABLET | Freq: Once | ORAL | Status: AC
Start: 2021-11-23 — End: 2021-11-23
  Administered 2021-11-23: 0.5 mg via ORAL
  Filled 2021-11-23: qty 1

## 2021-11-23 MED ORDER — WARFARIN - PHARMACIST DOSING INPATIENT: Freq: Every day | Status: DC

## 2021-11-23 MED ORDER — LACTULOSE 10 GM/15ML PO SOLN
10.0000 g | Freq: Two times a day (BID) | ORAL | Status: DC
  Administered 2021-11-23 – 2021-11-26 (×7): 10 g via ORAL
  Filled 2021-11-23 (×7): qty 15

## 2021-11-23 NOTE — Progress Notes (Signed)
[facility] for warfarin Indication:  VTE treatment  Allergies  Allergen Reactions   Antibiotic Ear [Neomycin-Polymyxin-Hc] Other (See Comments)    Unknown reaction   Penicillins Other (See Comments)    Yeast infection Did it involve swelling of the face/tongue/throat, SOB, or low BP? No Did it involve sudden or severe rash/hives, skin peeling, or any reaction on the inside of your mouth or nose? No Did you need to seek medical attention at a hospital or doctor's office? Yes When did it last happen? More than 5 years ago  If all above answers are "NO", may proceed with cephalosporin use.     Patient Measurements: Height: '5\' 7"'$  (170.2 cm) Weight: 46.2 kg (101 lb 14 oz) IBW/kg (Calculated) : 61.6  Vital Signs: Temp: 97.6 F (36.4 C) (09/07 0543) Temp Source: Oral (09/07 0543) BP: 108/72 (09/07 0543) Pulse Rate: 77 (09/07 0543)  Labs: Recent Labs    11/21/21 1940 11/22/21 1406 11/23/21 0702  HGB 12.6 10.9* 10.6*  HCT 40.2 35.1* 34.4*  PLT 157  --  114*  LABPROT 35.2* 39.9* 26.6*  INR 3.6* 4.2* 2.5*  CREATININE 0.93  --  0.79     Estimated Creatinine Clearance: 51.8 mL/min (by C-G formula based on SCr of 0.79 mg/dL).   Medical History: Past Medical History:  Diagnosis Date   Anxiety    anxiety and mild depressive order systoms   Bipolar disorder ([place])     (02/20/2013)   Cervical cancer ([place]) 03/19/1980   Cholelithiasis 11/11/2021   Depression    Dysrhythmia    Headache(784.0)    "monthly" (02/20/2013)   Hyperlipidemia 11/11/2021   Pacemaker    medtronic   PONV (postoperative nausea and vomiting)    Sinus pause 02/14/2013   Syncope and collapse    echo-April 12,2012-EF 55% Echo normal; recorder with prolonged sinus pauses --> status post   Tobacco abuse    Vertigo     Medications: Warfarin 1 mg PO daily PTA -Last dose: 9/5 -Pt stated she was told to HOLD her dose after 9/5 as still trying to figure out therapeutic  dose  Assessment: Pt is a 89 yoF who was recently started on warfarin.   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 Appears there was also concern for antiphospholipid syndrome. Pt was discharged on 11/01/21 on warfarin with enoxaparin bridge.   Patient had elevated INR (5.6) on outpatient follow up on 8/18 but clinic was unable to reach patient or daughter regarding results. Pt then presented to the ED on 8/22 with INR >10. Pt reported accidentally taking warfarin TWICE daily for several days during this time period in addition to being on enoxaparin bridge. Pt received vitamin K and was discharged on 8/23. She presented back to the ED on 8/25 PM with hematuria and supratherapeutic INR taking warfarin once daily. Pt was given vitamin K x1 dose and discharged with instructions to follow up with PCP for INR follow up and new warfarin dosing.   INR was slightly elevated (3.6) on presentation to ED on 9/5. Most recent dosing regimen reported as 1 mg PO daily. Pharmacy consulted to monitor INR and dose warfarin while inpatient.   Today, 11/23/21 INR = 2.5 is therapeutic today CBC: Hgb slightly low but stable; Plt low and  decreased Diet: Heart healthy; meal intake not charted. Albumin low. DDI: Divalproex may enhance effects of warfarin - home medication being continued. On ceftriaxone - not a major DDI but has some potential to enhance effects of warfarin.  Goal of Therapy:  INR 2-3 Monitor platelets by anticoagulation protocol: Yes   Plan:  Warfarin 0.5 mg PO once today INR daily CBC with AM labs tomorrow  If patient were to discharge today, would recommend discharging on reduced dose of warfarin 0.5 mg PO on Thurs, Sun; 1 mg all other days of the week. Recommend follow up with outpatient  clinic for INR check within 48 hours of hospital discharge to guide further dosing.   Lenis Noon, PharmD 11/23/2021,9:56 AM

## 2021-11-23 NOTE — Plan of Care (Signed)
Pt reporting pain 0/10 this morning.  No further complaints at this time.  Problem: Education: Goal: Knowledge of General Education information will improve Description: Including pain rating scale, medication(s)/side effects and non-pharmacologic comfort measures Outcome: Progressing   Problem: Health Behavior/Discharge Planning: Goal: Ability to manage health-related needs will improve Outcome: Progressing   Problem: Clinical Measurements: Goal: Ability to maintain clinical measurements within normal limits will improve Outcome: Progressing Goal: Will remain free from infection Outcome: Progressing Goal: Diagnostic test results will improve Outcome: Progressing Goal: Respiratory complications will improve Outcome: Progressing Goal: Cardiovascular complication will be avoided Outcome: Progressing   Problem: Activity: Goal: Risk for activity intolerance will decrease Outcome: Progressing   Problem: Nutrition: Goal: Adequate nutrition will be maintained Outcome: Progressing   Problem: Coping: Goal: Level of anxiety will decrease Outcome: Progressing   Problem: Elimination: Goal: Will not experience complications related to bowel motility Outcome: Progressing Goal: Will not experience complications related to urinary retention Outcome: Progressing   Problem: Pain Managment: Goal: General experience of comfort will improve Outcome: Progressing   Problem: Safety: Goal: Ability to remain free from injury will improve Outcome: Progressing   Problem: Skin Integrity: Goal: Risk for impaired skin integrity will decrease Outcome: Progressing

## 2021-11-23 NOTE — Progress Notes (Addendum)
PROGRESS NOTE [name]  NWG:[id number] DOB: 1955-08-02 DOA: 11/21/2021 PCP: Lujean Amel, MD   Brief Narrative/Hospital Course: 66 y.o.f w/ anxiety, depression, bipolar disorder, ADHD, headaches, cerebral venous thrombosis of sigmoid sinus, diagnosed with PE/DVT of left peroneal vein/left jugular vein thrombosis at a [place] facility about a month ago, cervical spondylosis, degeneration of the lumbar intervertebral disc, lumbar spine fusion, dysphagia, dysphagia, history of rhabdomyolysis, history of healthcare associated pneumonia, sepsis due to pneumonia, hypoglycemia laryngopharyngeal reflux left ovarian cyst, memory loss, myositis, right knee pain, lower extremity weakness, retroperitoneal fibrosis sciatica, seizure-like activity, sinus pauses, history of pacemaker placement who was recently admitted and discharged for supratherapeutic INR twice in the past 2 weeks who is brought to the ED due to being found by her daughter with food waste in bed, had not cleaned up for the last 2 days,confusion associated with lower abdominal pain and bilateral lower extremity weakness. In the ED: Initial vital signs  stable HR 113 UA- moderate leukocyte esterase and proteinuria.  White count 10.6, hemoglobin 12.6 g/dL platelets 157.  PT 35.2 and INR 3.6.Coronavirus PCR was negative.Lipase was normal.CMP showed a chloride of 112 mmol/L, glucose 117 mg/dL and AST decreased at 11 units/L.  The rest of the CMP measurements were unremarkable.  Valproic acid level was within therapeutic range. CXR-no active disease. CT head was contrast with no acute intracranial abnormality. She was admitted with CONFUSION with UTI, supratherapeutic INR.     Subjective: Seen and examined Resting well [place] well More communicative oriented.   Assessment and Plan:  Acute encephalopathy UTI Hyperammonemia: Patient was confused at home work-up shows UTI and on empiric antibiotics.  Hemoglobin is elevated adding lactulose  wondering if constipation contributing.  CT head no acute finding  Supratherapeutic INR History of pulmonary embolism: INR is stable now Coumadin being dosed by pharmacy monitor PT/INR.  Patient reports that she has been referred to hematology oncology and has appointment coming soon  Tobacco abuse:Continue nicotine patch  Bipolar disorder  Depression with anxiety: Mood is stable continue her Depakote, amitriptyline, risperidone and alprazolam.   Degeneration of lumbar intervertebral disc: Continue tizanidine pain control  Underweight: BMI 15 augment nutritional status.  Consult dietitian  Hyperlipidemia: Aortic atherosclerosis: started on low-dose statin.Outpatient follow-up with PCP will be needed  DVT prophylaxis:  Code Status:   Code Status: Full Code Family Communication: plan of care discussed with patient at bedside. Updated daughter in evening after 3 attempts. She prefers to monitor one more day to ensure mentation remains stable. Patient status is: Admitted observation remains hospitalized for ongoing management of UTI hyperammonemia encephalopathy  Level of care: Med-Surg  Dispo: The patient is from: home. ambulatory            Anticipated disposition: home tomorrow  Mobility Assessment (last 72 hours)     Mobility Assessment     Row Name 11/23/21 1030 11/22/21 1300         Does patient have an order for bedrest or is patient medically unstable No - Continue assessment No - Continue assessment      What is the highest level of mobility based on the progressive mobility assessment? Level 6 (Walks independently in room and hall) - Balance while walking in room without assist - Complete Level 6 (Walks independently in room and hall) - Balance while walking in room without assist - Complete                Objective: Vitals last 24 hrs: Vitals:  11/22/21 2206 11/23/21 0155 11/23/21 0543 11/23/21 1246  BP: 107/73 115/86 108/72 117/73  Pulse: 75 78 77 82   Resp: '16 16 16 18  '$ Temp: 98.1 F (36.7 C) (!) 97.5 F (36.4 C) 97.6 F (36.4 C) 98.4 F (36.9 C)  TempSrc: Oral Oral Oral Oral  SpO2: 96% 96% 98% 96%  Weight:      Height:       Weight change:   Physical Examination: General exam: alert awake,older than stated age, weak appearing. HEENT:Oral mucosa moist, Ear/Nose WNL grossly, dentition normal. Respiratory system: bilaterally diminished BS, no use of accessory muscle Cardiovascular system: S1 & S2 +, No JVD. Gastrointestinal system: Abdomen soft,NT,ND, BS+ Nervous System:Alert, awake, moving extremities and grossly nonfocal Extremities: LE edema neg,distal peripheral pulses palpable.  Skin: No rashes,no icterus. MSK: Normal muscle bulk,tone, power  Medications reviewed:  Scheduled Meds:  atorvastatin  10 mg Oral Daily   divalproex  250 mg Oral TID   lactulose  10 g Oral BID   nicotine  14 mg Transdermal Daily   risperiDONE  3 mg Oral QHS   warfarin  0.5 mg Oral ONCE-1600   Warfarin - Pharmacist Dosing Inpatient   Does not apply q1600   zolpidem  5 mg Oral QHS   Continuous Infusions:  cefTRIAXone (ROCEPHIN)  IV 1 g (11/23/21 0849)      Diet Order             Diet Heart Room service appropriate? Yes; Fluid consistency: Thin  Diet effective now                            Intake/Output Summary (Last 24 hours) at 11/23/2021 1542 Last data filed at 11/23/2021 0945 Gross per 24 hour  Intake 118 ml  Output --  Net 118 ml   Net IO Since Admission: 630.91 mL [11/23/21 1542]  Wt Readings from Last 3 Encounters:  11/22/21 46.2 kg  11/10/21 45.8 kg  11/07/21 42.6 kg     Unresulted Labs (From admission, onward)     Start     Ordered   11/24/21 0500  CBC  Tomorrow morning,   R        11/23/21 1021   11/23/21 0928  Urine Culture  (Urine Culture)  Add-on,   AD       Question:  Indication  Answer:  Dysuria   11/23/21 0927   11/22/21 0826  Protime-INR  Daily at 5am,   R      11/22/21 0825          Data  Reviewed: I have personally reviewed following labs and imaging studies CBC: Recent Labs  Lab 11/21/21 1940 11/22/21 1406 11/23/21 0702  WBC 10.6*  --  6.3  NEUTROABS 6.1  --   --   HGB 12.6 10.9* 10.6*  HCT 40.2 35.1* 34.4*  MCV 91.4  --  93.2  PLT 157  --  195*   Basic Metabolic Panel: Recent Labs  Lab 11/21/21 1940 11/23/21 0702  NA 142 144  K 4.3 4.0  CL 112* 116*  CO2 24 25  GLUCOSE 117* 94  BUN 9 11  CREATININE 0.93 0.79  CALCIUM 9.0 8.4*   GFR: Estimated Creatinine Clearance: 51.8 mL/min (by C-G formula based on SCr of 0.79 mg/dL). Liver Function Tests: Recent Labs  Lab 11/21/21 1940 11/23/21 0702  AST 11* 9*  ALT 7 6  ALKPHOS 81 67  BILITOT 0.4 0.6  PROT 6.5 5.2*  ALBUMIN 3.7 2.9*   Recent Labs  Lab 11/21/21 1940  LIPASE 29   Recent Labs  Lab 11/23/21 1021  AMMONIA 131*   Coagulation Profile: Recent Labs  Lab 11/21/21 1940 11/22/21 1406 11/23/21 0702  INR 3.6* 4.2* 2.5*   BNP (last 3 results) No results for input(s): "PROBNP" in the last 8760 hours. HbA1C: No results for input(s): "HGBA1C" in the last 72 hours. CBG: No results for input(s): "GLUCAP" in the last 168 hours. Lipid Profile: No results for input(s): "CHOL", "HDL", "LDLCALC", "TRIG", "CHOLHDL", "LDLDIRECT" in the last 72 hours. Thyroid Function Tests: Recent Labs    11/23/21 1021  TSH 0.673   Sepsis Labs: No results for input(s): "PROCALCITON", "LATICACIDVEN" in the last 168 hours.  Recent Results (from the past 240 hour(s))  SARS Coronavirus 2 by RT PCR (hospital order, performed in [facility] hospital lab) *cepheid single result test* Anterior Nasal Swab     Status: None   Collection Time: 11/22/21  2:35 AM   Specimen: Anterior Nasal Swab  Result Value Ref Range Status   SARS Coronavirus 2 by RT PCR NEGATIVE NEGATIVE Final    Comment: (NOTE) SARS-CoV-2 target nucleic acids are NOT DETECTED.  The SARS-CoV-2 RNA is generally detectable in upper and  lower respiratory specimens during the acute phase of infection. The lowest concentration of SARS-CoV-2 viral copies this assay can detect is 250 copies / mL. A negative result does not preclude SARS-CoV-2 infection and should not be used as the sole basis for treatment or other patient management decisions.  A negative result may occur with improper specimen collection / handling, submission of specimen other than nasopharyngeal swab, presence of viral mutation(s) within the areas targeted by this assay, and inadequate number of viral copies (<250 copies / mL). A negative result must be combined with clinical observations, patient history, and epidemiological information.  Fact Sheet for Patients:   https://www.patel.info/  Fact Sheet for Healthcare Providers: https://hall.com/  This test is not yet approved or  cleared by the [place] FDA and has been authorized for detection and/or diagnosis of SARS-CoV-2 by FDA under an Emergency Use Authorization (EUA).  This EUA will remain in effect (meaning this test can be used) for the duration of the COVID-19 declaration under Section 564(b)(1) of the Act, 21 U.S.C. section 360bbb-3(b)(1), unless the authorization is terminated or revoked sooner.  Performed at [facility], [place] [address] [place], [place] [address]     Antimicrobials: Anti-infectives (From admission, onward)    Start     Dose/Rate Route Frequency Ordered Stop   11/22/21 1245  cefTRIAXone (ROCEPHIN) 1 g in sodium chloride 0.9 % 100 mL IVPB        1 g 200 mL/hr over 30 Minutes Intravenous Daily 11/22/21 1118     11/22/21 0600  aztreonam (AZACTAM) 0.5 g in dextrose 5 % 50 mL IVPB  Status:  Discontinued        0.5 g 105 mL/hr over 30 Minutes Intravenous Every 8 hours 11/22/21 0402 11/22/21 0409   11/22/21 0415  aztreonam (AZACTAM) 1 g in sodium chloride 0.9 % 100 mL IVPB  Status:  Discontinued        1  g 200 mL/hr over 30 Minutes Intravenous Every 8 hours 11/22/21 0409 11/22/21 1118      Culture/Microbiology  DG Chest Portable 1 View  Result Date: 11/22/2021 CLINICAL DATA:  Altered mental status EXAM: PORTABLE CHEST 1 VIEW COMPARISON:  None Available.  FINDINGS: Lungs are clear. No pneumothorax or pleural effusion. Cardiac size within normal limits on this semi erect examination. Left subclavian dual lead pacemaker is unchanged. Pulmonary vascularity is normal. No acute bone abnormality. IMPRESSION: No active disease. Electronically Signed   By: Fidela Salisbury M.D.   On: 11/22/2021 02:59   CT Head Wo Contrast  Result Date: 11/21/2021 CLINICAL DATA:  Mental status change, unknown cause EXAM: CT HEAD WITHOUT CONTRAST TECHNIQUE: Contiguous axial images were obtained from the base of the skull through the vertex without intravenous contrast. RADIATION DOSE REDUCTION: This exam was performed according to the departmental dose-optimization program which includes automated exposure control, adjustment of the mA and/or kV according to patient size and/or use of iterative reconstruction technique. COMPARISON:  Head CT 05/03/2021 FINDINGS: Brain: No intracranial hemorrhage, mass effect, or midline shift. Stable degree of atrophy. No hydrocephalus. The basilar cisterns are patent. No evidence of territorial infarct or acute ischemia. No extra-axial or intracranial fluid collection. Vascular: Atherosclerosis of skullbase vasculature without hyperdense vessel or abnormal calcification. Skull: No fracture or focal lesion. Sinuses/Orbits: Paranasal sinuses and mastoid air cells are clear. The visualized orbits are unremarkable. Other: None. IMPRESSION: No acute intracranial abnormality. Electronically Signed   By: Keith Rake M.D.   On: 11/21/2021 20:45     LOS: 0 days   Antonieta Pert, MD [facility]  11/23/2021, 3:42 PM

## 2021-11-23 NOTE — TOC Initial Note (Signed)
Transition of Care [facility]) - Initial/Assessment Note    Patient Details  Name: [name] MRN: [id number] Date of Birth: [birth year]  Transition of Care [facility]) CM/SW Contact:    Leeroy Cha, RN Phone Number: 11/23/2021, 7:47 AM  Clinical Narrative:                  Transition of Care [facility]) Screening Note   Patient Details  Name: [name] Date of Birth: 16-Nov-1955   Transition of Care [facility]) CM/SW Contact:    Leeroy Cha, RN Phone Number: 11/23/2021, 7:47 AM    Transition of Care Department [facility]) has reviewed patient and no TOC needs have been identified at this time. We will continue to monitor patient advancement through interdisciplinary progression rounds. If new patient transition needs arise, please place a TOC consult.    Expected Discharge Plan: Home/Self Care Barriers to Discharge: Continued Medical Work up   Patient Goals and CMS Choice Patient states their goals for this hospitalization and ongoing recovery are:: to go back home CMS Medicare.gov Compare Post Acute Care list provided to:: Patient    Expected Discharge Plan and Services Expected Discharge Plan: Home/Self Care   Discharge Planning Services: CM Consult   Living arrangements for the past 2 months: Apartment                                      Prior Living Arrangements/Services Living arrangements for the past 2 months: Apartment Lives with:: Self, Adult Children Patient language and need for interpreter reviewed:: Yes Do you feel safe going back to the place where you live?: Yes            Criminal Activity/Legal Involvement Pertinent to Current Situation/Hospitalization: No - Comment as needed  Activities of Daily Living Home Assistive Devices/Equipment: Eyeglasses, Dentures (specify type) ADL Screening (condition at time of admission) Patient's cognitive ability adequate to safely complete daily activities?: Yes Is the patient deaf or have difficulty  hearing?: No Does the patient have difficulty seeing, even when wearing glasses/contacts?: No Does the patient have difficulty concentrating, remembering, or making decisions?: Yes Patient able to express need for assistance with ADLs?: Yes Does the patient have difficulty dressing or bathing?: No Independently performs ADLs?: Yes (appropriate for developmental age) Does the patient have difficulty walking or climbing stairs?: No Weakness of Legs: None Weakness of Arms/Hands: None  Permission Sought/Granted                  Emotional Assessment Appearance:: Appears stated age Attitude/Demeanor/Rapport: Engaged Affect (typically observed): Calm Orientation: : Oriented to Self, Oriented to Place Alcohol / Substance Use: Tobacco Use (current use) Psych Involvement: No (comment)  Admission diagnosis:  Acute encephalopathy [G93.40] Urinary tract infection without hematuria, site unspecified [N39.0] Altered mental status, unspecified altered mental status type [R41.82] Patient Active Problem List   Diagnosis Date Noted   Acute encephalopathy 11/22/2021   Gross hematuria 11/11/2021   Aortic atherosclerosis ([place]) 11/11/2021   Cholelithiasis 11/11/2021   Hyperlipidemia 11/11/2021   Supratherapeutic INR 11/07/2021   History of pulmonary embolism 11/07/2021   Cerebral venous thrombosis of sigmoid sinus 11/07/2021   Seizure-like activity ([place]) 11/07/2021   Acute deep vein thrombosis (DVT) of left peroneal vein ([place]) 10/27/2021   Acute pulmonary embolism without acute cor pulmonale ([place]) 10/27/2021   Jugular vein thrombosis, left 10/27/2021   Acute cystitis 10/26/2021   Severe protein-calorie  malnutrition ([place]) 10/25/2021   Electrolyte abnormality 05/04/2021   Altered mental status 05/04/2021   fall with rhabdomyloysis  05/03/2021   Leukocytosis 05/03/2021   Chronic pain 05/03/2021   Underweight 05/03/2021   Lactic acidosis 65/05/5463   Acute metabolic encephalopathy 68/02/7516    Degeneration of lumbar intervertebral disc 08/16/2020   Sepsis secondary to UTI ([place]) 04/17/2020   Hypoglycemia without diagnosis of diabetes mellitus 04/17/2020   Sepsis due to pneumonia ([place]) 04/17/2020   Physical deconditioning    Myositis 03/03/2020   HCAP (healthcare-associated pneumonia) 02/21/2020   Generalized weakness 02/21/2020   GAD (generalized anxiety disorder) 02/21/2020   Proximal muscle weakness 02/08/2020   Dysphagia 02/08/2020   Gait disturbance 01/04/2020   Elevated CK 01/04/2020   Weakness of both lower extremities 01/04/2020   Encephalopathy    Polypharmacy    Fall 12/18/2019   Hypokalemia    Abnormal liver function test    Cervical post-laminectomy syndrome 10/21/2019   Cervical spondylosis 09/23/2019   Memory loss 07/02/2019   Depression with anxiety 07/02/2019   B12 deficiency 07/02/2019   Lumbar radiculopathy 02/10/2019   History of fusion of lumbar spine 02/10/2019   Dysesthesia 02/10/2019   Retroperitoneal fibrosis    Pelvic adhesions    Left ovarian cyst 11/17/2018   Elevated tumor markers 11/17/2018   Dyspnea 09/15/2018   Laryngopharyngeal reflux (LPR) 05/19/2018   Bipolar disorder ([place]) 04/16/2018   Insomnia 04/16/2018   Attention deficit hyperactivity disorder (ADHD) 04/16/2018   Pain in right knee 04/04/2017   Neck pain on right side 09/09/2014   Pseudoarthrosis of lumbar spine 04/15/2014   Family history of arrhythmogenic right ventricular cardiomyopathy 03/30/2013   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 02/20/2013   Sinus pause 02/14/2013   Hx of syncope- s/p Loop recorder 11/06/12 10/29/2012   Vertigo 10/29/2012   Tobacco abuse 10/29/2012   ANXIETY 11/14/2009   BENIGN POSITIONAL VERTIGO 11/14/2009   EAR PAIN, RIGHT 11/14/2009   SCIATICA 05/09/2009   PCP:  Lujean Amel, MD Pharmacy:   [id number] [place], [place]Phone: [phone]:  [phone]    Social Determinants of Health (SDOH) Interventions    Readmission Risk Interventions   Row Labels 11/13/2021   10:54 AM  Readmission Risk Prevention Plan   Section Header. No data exists in this row.   Transportation Screening   Complete  PCP or Specialist Appt within 3-5 Days   Complete  [place] or [place]  Complete  Social Work [place] for [place]   Complete  Palliative Care Screening   Not Applicable  Medication Review (Press photographer   Complete

## 2021-11-23 NOTE — Hospital Course (Addendum)
66 y.o.f w/ anxiety/depression/bipolar disorder/ADHD, headaches, cerebral venous thrombosis of sigmoid sinus, diagnosed with PE/DVT of left peroneal vein/left jugular vein thrombosis at a [place] facility about a month ago, cervical spondylosis, degeneration of the lumbar intervertebral disc, lumbar spine fusion, dysphagia, dysphagia, history of rhabdomyolysis, history of healthcare associated pneumonia, sepsis due to pneumonia, hypoglycemia laryngopharyngeal reflux left ovarian cyst, memory loss, myositis, right knee pain, lower extremity weakness, retroperitoneal fibrosis sciatica, seizure-like activity, sinus pauses, history of pacemaker placement who was recently admitted and discharged for supratherapeutic INR twice in the past 2 weeks who is brought to the ED due to being found by her daughter with food waste in bed, had not cleaned up for the last 2 days,confusion associated with lower abdominal pain and bilateral lower extremity weakness. Per chart: 8/7-8/16/2023, she was hospitalized at [facility] in [place] while she was visiting there.  Per history, she initially presented with seizure-like activity and started on Depakote. Continues video EEG showed bilateral and focal cerebral dysfunction and focal cortical irritability which improved with AED.  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  She was initiated on Lovenox and subsequently started on Coumadin with plan to continue both until INR was therapeutic.  It seems there was a concern for antiphospholipid syndrome because of which Coumadin and not DOAC were chosen- she had elevated cardiolipin antibody   In the ED: Initial vital signs  stable HR 113. UA- moderate leukocyte esterase  and proteinuria.  White count 10.6, hemoglobin 12.6 g/dL platelets 157.  PT 35.2 and INR 3.6.Coronavirus PCR was negative.Lipase was normal.CMP showed a chloride of 112 mmol/L, glucose 117 mg/dL and AST decreased at 11 units/L.  The rest of the CMP measurements were unremarkable.  Valproic acid level was within therapeutic range. CXR-no active disease. CT head was contrast with no acute intracranial abnormality. She was admitted with CONFUSION with UTI, supratherapeutic INR.  Since admission mental status has normalized-pharmacy dosing Coumadin.  Seen by hematology oncology advised to continue Lovenox patient has been kept in the hospital to ensure that she is able to do injection.  Lovenox has been arranged.  Patient will be completing 5 days of antibiotics urine culture unremarkable mental status alert awake oriented at baseline.

## 2021-11-24 ENCOUNTER — Ambulatory Visit: Payer: Medicare Other

## 2021-11-24 DIAGNOSIS — G9340 Encephalopathy, unspecified: Secondary | ICD-10-CM

## 2021-11-24 LAB — CBC
HCT: 33.7 % — ABNORMAL LOW (ref 36.0–46.0)
Hemoglobin: 10.6 g/dL — ABNORMAL LOW (ref 12.0–15.0)
MCH: 29 pg (ref 26.0–34.0)
MCHC: 31.5 g/dL (ref 30.0–36.0)
MCV: 92.3 fL (ref 80.0–100.0)
Platelets: 104 10*3/uL — ABNORMAL LOW (ref 150–400)
RBC: 3.65 MIL/uL — ABNORMAL LOW (ref 3.87–5.11)
RDW: 15 % (ref 11.5–15.5)
WBC: 6.2 10*3/uL (ref 4.0–10.5)
nRBC: 0 % (ref 0.0–0.2)

## 2021-11-24 LAB — PROTIME-INR
INR: 1.8 — ABNORMAL HIGH (ref 0.8–1.2)
Prothrombin Time: 20.3 seconds — ABNORMAL HIGH (ref 11.4–15.2)

## 2021-11-24 LAB — URINE CULTURE: Culture: NO GROWTH

## 2021-11-24 MED ORDER — ADULT MULTIVITAMIN W/MINERALS CH
1.0000 | ORAL_TABLET | Freq: Every day | ORAL | Status: DC
  Administered 2021-11-24 – 2021-11-26 (×3): 1 via ORAL
  Filled 2021-11-24 (×3): qty 1

## 2021-11-24 MED ORDER — ENOXAPARIN SODIUM 60 MG/0.6ML IJ SOSY
1.0000 mg/kg | PREFILLED_SYRINGE | Freq: Two times a day (BID) | INTRAMUSCULAR | Status: DC
  Administered 2021-11-24 – 2021-11-25 (×3): 45 mg via SUBCUTANEOUS
  Filled 2021-11-24 (×3): qty 0.6

## 2021-11-24 MED ORDER — WARFARIN - PHARMACIST DOSING INPATIENT: Freq: Every day | Status: DC

## 2021-11-24 MED ORDER — WARFARIN SODIUM 1 MG PO TABS
1.0000 mg | ORAL_TABLET | Freq: Once | ORAL | Status: AC
  Administered 2021-11-24: 1 mg via ORAL
  Filled 2021-11-24: qty 1

## 2021-11-24 NOTE — Consult Note (Signed)
Hematology/Oncology Inpatient Consult Note  Clinical Summary: 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   Reason For Consult: Concern for antiphospholipid antibody syndrome and supratherapeutic INR  HPI:   [name] is a 66 year old female with medical history significant for anxiety, depression, ADHD, and headaches who presents with confusion.  The patient was brought to the emergency department on 11/22/2021 by her daughter due to concern for altered mental status.  As part of her initial evaluation the emergency department she was found to have white blood cell count elevated at 10.6 with urinalysis showing moderate leukocyte esterase and proteinuria.  Additionally her INR was found to be 3.6 and subsequently 4.2 on 11/22/2021.  On review of prior records the patient recently had hypercoagulation work-up in [place].  On 10/27/2021 the patient had studies collected which were negative for factor V Leiden and prothrombin gene mutation and negative for lupus anticoagulant, however patient had beta-2 glycoprotein IgM elevated greater than 150 and anticardiolipin antibodies IgM elevated above 103.5.  On exam today [name] is unaccompanied.  She reports that she has lost quite a bit of weight in the last several months.  She dropped down to 99 pounds from her baseline level of weight of 122 pounds.  She reports that she never felt uncomfortable with her VTE's and never had any pain.  She reports her family history is obscured by the fact that she was adopted.  She is a former smoker having quit smoking 1 week ago.  She was previously smoking 1 pack/day.  She underwent a hysterectomy but her ovaries were left in place.  She is not currently on any hormone therapy or estrogen  containing supplementation.  She notes that she initial diagnosis occurred after she began to "convulsing and foaming at the mouth".  She currently denies any fevers, chills, sweats, nausea, vomiting or diarrhea.  A full 10 point ROS is listed below.  Of note she reports that she is taking her Coumadin therapy as prescribed.  She has not been having any issues with bleeding, bruising, or dark stools.   O:  Vitals:   11/24/21 1330 11/24/21 1938  BP: 122/78 118/67  Pulse: 86 87  Resp: 16 18  Temp:  98.4 F (36.9 C)  SpO2: 95% 97%      Latest Ref Rng & Units 11/23/2021    7:02 AM 11/21/2021    7:40 PM 11/13/2021    4:26 AM  CMP  Glucose 70 - 99 mg/dL 94  117  101   BUN 8 - 23 mg/dL '11  9  11   '$ Creatinine 0.44 - 1.00 mg/dL 0.79  0.93  0.77   Sodium 135 - 145 mmol/L 144  142  141   Potassium 3.5 - 5.1 mmol/L 4.0  4.3  4.3   Chloride 98 - 111 mmol/L 116  112  110   CO2 22 - 32 mmol/L '25  24  24   '$ Calcium 8.9 - 10.3 mg/dL 8.4  9.0  8.6   Total Protein 6.5 - 8.1 g/dL 5.2  6.5    Total Bilirubin 0.3 - 1.2 mg/dL 0.6  0.4    Alkaline Phos 38 - 126 U/L 67  81    AST 15 - 41 U/L 9  11  ALT 0 - 44 U/L 6  7        Latest Ref Rng & Units 11/24/2021    6:19 AM 11/23/2021    7:02 AM 11/22/2021    2:06 PM  CBC  WBC 4.0 - 10.5 K/uL 6.2  6.3    Hemoglobin 12.0 - 15.0 g/dL 10.6  10.6  10.9   Hematocrit 36.0 - 46.0 % 33.7  34.4  35.1   Platelets 150 - 400 K/uL 104  114        GENERAL: well appearing middle-aged Caucasian female in no acute distress in NAD  SKIN: skin color, texture, turgor are normal, no rashes or significant lesions LUNGS: clear to auscultation and percussion with normal breathing effort HEART: regular rate & rhythm and no murmurs and no lower extremity edema Musculoskeletal: no cyanosis of digits and no clubbing  PSYCH: alert & oriented x 3, fluent speech NEURO: no focal motor/sensory deficits  Assessment/Plan: [name] is a 66 year old female with medical history  significant for anxiety, depression, ADHD, and headaches who presents with confusion.  #Multiple VTE #Concern for Antiphospholipid antibody syndrome -- Recommend transitioning to Lovenox 1 mg/kg twice daily as she is having difficulty controlling her INRs. --DOAC therapy is not appropriate in the setting of concern for antiphospholipid antibody syndrome -- In order to confirm antiphospholipid antibody syndrome patient will require repeat testing in 12 weeks time from her initial studies on 10/27/2021.  She had markedly positive beta-2 glycoprotein and anticardiolipin antibodies. -- Extensive hypercoagulation work-up was otherwise negative -- Patient already has follow-up set up in our clinic in approximately 2 weeks time.   Ledell Peoples, MD Department of Hematology/Oncology [place] [name] Phone: [phone] Pager: [phone] Email: [email or web address]

## 2021-11-24 NOTE — Discharge Instructions (Signed)

## 2021-11-24 NOTE — Progress Notes (Signed)
Initial Nutrition Assessment  DOCUMENTATION CODES:   Severe malnutrition in context of social or environmental circumstances, Underweight  INTERVENTION:   -Encouraged pt to request Ensure supplements from unit if desired  -Multivitamin with minerals daily  -Magic cup BID with meals, each supplement provides 290 kcal and 9 grams of protein   -Placed "High Calorie, High Protein" handout in AVS  NUTRITION DIAGNOSIS:   Severe Malnutrition related to social / environmental circumstances (h/o bipolar disorder, anxiety, depression) as evidenced by percent weight loss, moderate fat depletion, mild muscle depletion, energy intake < or equal to 50% for > or equal to 1 month.  GOAL:   Patient will meet greater than or equal to 90% of their needs  MONITOR:   PO intake, Labs, Weight trends, I & O's  REASON FOR ASSESSMENT:   Consult Assessment of nutrition requirement/status  ASSESSMENT:   66 y.o.f w/ anxiety, depression, bipolar disorder, ADHD, headaches, cerebral venous thrombosis of sigmoid sinus, diagnosed with PE/DVT of left peroneal vein/left jugular vein thrombosis at a [place] facility about a month ago, cervical spondylosis, degeneration of the lumbar intervertebral disc, lumbar spine fusion, dysphagia, dysphagia, history of rhabdomyolysis, history of healthcare associated pneumonia, sepsis due to pneumonia, hypoglycemia laryngopharyngeal reflux left ovarian cyst, memory loss, myositis, right knee pain, lower extremity weakness, retroperitoneal fibrosis sciatica, seizure-like activity, sinus pauses, history of pacemaker placement who was recently admitted and discharged for supratherapeutic INR twice in the past 2 weeks who is brought to the ED due to being found by her daughter with food waste in bed, had not cleaned up for the last 2 days,confusion associated with lower abdominal pain and bilateral lower extremity weakness.  Pt in room, states she ate most of her breakfast.  Consumed 100% of eggs and potatoes, juice but 25% of bagel. Pt states her appetite is better today. Has been poor for 6 weeks now, with only consuming 1 meal per day. States this would be "junk food, anything easy to eat that I don't have to cook". PTA pt was found with food in bed, confused.  Pt does not like Ensure supplements so declines these at this time. Encouraged her to request them if her appetite decreases. Will add Magic cups to meals.  Per weight records, pt has lost 18 lbs since December 2022 (15% wt loss x 8.5 months, significant for time frame).  Medications: Lactulose  Labs reviewed.  NUTRITION - FOCUSED PHYSICAL EXAM:  Flowsheet Row Most Recent Value  Orbital Region Mild depletion  Upper Arm Region Mild depletion  Thoracic and Lumbar Region Moderate depletion  Buccal Region Moderate depletion  Temple Region Severe depletion  Clavicle Bone Region Mild depletion  Clavicle and Acromion Bone Region Mild depletion  Scapular Bone Region Mild depletion  Dorsal Hand Mild depletion  Patellar Region No depletion  Anterior Thigh Region No depletion  Posterior Calf Region No depletion  Edema (RD Assessment) None  Hair Reviewed  Eyes Reviewed  Mouth Reviewed  Skin Reviewed       Diet Order:   Diet Order             Diet Heart Room service appropriate? Yes; Fluid consistency: Thin  Diet effective now                   EDUCATION NEEDS:   Education needs have been addressed  Skin:  Skin Assessment: Reviewed RN Assessment  Last BM:  9/8 -type 6  Height:   Ht Readings from Last 1 Encounters:  11/22/21 '5\' 7"'$  (1.702 m)    Weight:   Wt Readings from Last 1 Encounters:  11/22/21 46.2 kg    BMI:  Body mass index is 15.96 kg/m.  Estimated Nutritional Needs:   Kcal:  1800-2000  Protein:  85-100g  Fluid:  2L/day   Clayton Bibles, MS, RD, LDN Inpatient Clinical Dietitian Contact information available via Amion

## 2021-11-24 NOTE — Progress Notes (Signed)
PROGRESS NOTE [name]  DGL:[id number] DOB: 10/06/1955 DOA: 11/21/2021 PCP: Lujean Amel, MD   Brief Narrative/Hospital Course: 66 y.o.f w/ anxiety/depression/bipolar disorder/ADHD, headaches, cerebral venous thrombosis of sigmoid sinus, diagnosed with PE/DVT of left peroneal vein/left jugular vein thrombosis at a [place] facility about a month ago, cervical spondylosis, degeneration of the lumbar intervertebral disc, lumbar spine fusion, dysphagia, dysphagia, history of rhabdomyolysis, history of healthcare associated pneumonia, sepsis due to pneumonia, hypoglycemia laryngopharyngeal reflux left ovarian cyst, memory loss, myositis, right knee pain, lower extremity weakness, retroperitoneal fibrosis sciatica, seizure-like activity, sinus pauses, history of pacemaker placement who was recently admitted and discharged for supratherapeutic INR twice in the past 2 weeks who is brought to the ED due to being found by her daughter with food waste in bed, had not cleaned up for the last 2 days,confusion associated with lower abdominal pain and bilateral lower extremity weakness. Per chart: 8/7-8/16/2023, she was hospitalized at [facility] in [place] while she was visiting there.  Per history, she initially presented with seizure-like activity and started on Depakote. Continues video EEG showed bilateral and focal cerebral dysfunction and focal cortical irritability which improved with AED.  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  She was initiated on Lovenox and subsequently started on Coumadin with plan to continue both until INR was therapeutic.  It seems there was a concern for antiphospholipid syndrome because of which Coumadin and not  DOAC were chosen- she had elevated cardiolipin antibody   In the ED: Initial vital signs  stable HR 113. UA- moderate leukocyte esterase and proteinuria.  White count 10.6, hemoglobin 12.6 g/dL platelets 157.  PT 35.2 and INR 3.6.Coronavirus PCR was negative.Lipase was normal.CMP showed a chloride of 112 mmol/L, glucose 117 mg/dL and AST decreased at 11 units/L.  The rest of the CMP measurements were unremarkable.  Valproic acid level was within therapeutic range. CXR-no active disease. CT head was contrast with no acute intracranial abnormality. She was admitted with CONFUSION with UTI, supratherapeutic INR.  Since admission mental status has normalized-pharmacy dosing Coumadin     Subjective: Seen and examined this morning appears comfortable alert awake oriented, INR is subtherapeutic.     Assessment and Plan:  Acute encephalopathy UTI Hyperammonemia: Mental status improved.  Patient was confused at home-treating for UTI with empiric antibiotics.  Ammonia is elevated likely contributing to her altered mental status, no liver dysfunction on labs, CT imaging previously showed no acute finding on the liver .having bowel movement on lactulose.  Continue the same continue antibiotics.  CT head on admission no acute finding.   Supratherapeutic INR History of pulmonary embolism: INR is now subtherapeutic will bridge with Lovenox.  Formally consult hematology oncology given her difficulty with Coumadin management due to concern for antiphospholipid syndrome unable to use DOAC.  Pharmacy dosing Coumadin.  Recent Labs  Lab 11/21/21 1940 11/22/21 1406 11/23/21 0702 11/24/21 0619  INR 3.6* 4.2* 2.5* 1.8*    Tobacco abuse:Continue nicotine patch  Bipolar disorder  Depression with anxiety: mood stable continue her Depakote, amitriptyline, risperidone and alprazolam.   Degeneration of lumbar intervertebral disc: Continue tizanidine pain control  Underweight: BMI 15 augment nutritional status.   Dietitian to follow-up  Hyperlipidemia: Aortic atherosclerosis: started on low-dose statin.Outpatient follow-up with PCP  will be needed  DVT prophylaxis:  Code Status:   Code Status: Full Code Family Communication: plan of care discussed with patient at bedside. Level of care: Med-Surg remains hospitalized due to management of subtherapeutic INR at this time Dispo: The patient is from: home. ambulatory            Anticipated disposition: home tomorrow  Mobility Assessment (last 72 hours)     Mobility Assessment     Row Name 11/24/21 0900 11/23/21 2023 11/23/21 1030 11/22/21 1300     Does patient have an order for bedrest or is patient medically unstable No - Continue assessment No - Continue assessment No - Continue assessment No - Continue assessment    What is the highest level of mobility based on the progressive mobility assessment? Level 6 (Walks independently in room and hall) - Balance while walking in room without assist - Complete Level 6 (Walks independently in room and hall) - Balance while walking in room without assist - Complete Level 6 (Walks independently in room and hall) - Balance while walking in room without assist - Complete Level 6 (Walks independently in room and hall) - Balance while walking in room without assist - Complete              Objective: Vitals last 24 hrs: Vitals:   11/23/21 0543 11/23/21 1246 11/23/21 2217 11/24/21 0623  BP: 108/72 117/73 100/65 125/81  Pulse: 77 82 83 81  Resp: '16 18 16 16  '$ Temp: 97.6 F (36.4 C) 98.4 F (36.9 C) 98.3 F (36.8 C) 98 F (36.7 C)  TempSrc: Oral Oral Oral Oral  SpO2: 98% 96% 95% 95%  Weight:      Height:       Weight change:   Physical Examination: General exam: AAOX3, 66, weak appearing. HEENT:Oral mucosa moist, Ear/Nose WNL grossly, dentition normal. Respiratory system: bilaterally CLEAR, no use of accessory muscle Cardiovascular system: S1 & S2 +, No JVD,. Gastrointestinal  system: Abdomen soft,NT,ND,BS+ Nervous System:Alert, awake, moving extremities and grossly nonfocal Extremities: LE ankle edema NEG, distal peripheral pulses palpable.  Skin: No rashes,no icterus. MSK: Normal muscle bulk,tone, power   Medications reviewed:  Scheduled Meds:  atorvastatin  10 mg Oral Daily   divalproex  250 mg Oral TID   enoxaparin (LOVENOX) injection  1 mg/kg Subcutaneous BID   lactulose  10 g Oral BID   nicotine  14 mg Transdermal Daily   risperiDONE  3 mg Oral QHS   Warfarin - Pharmacist Dosing Inpatient   Does not apply q1600   zolpidem  5 mg Oral QHS   Continuous Infusions:  cefTRIAXone (ROCEPHIN)  IV 1 g (11/24/21 0934)      Diet Order             Diet Heart Room service appropriate? Yes; Fluid consistency: Thin  Diet effective now                    Nutrition Problem: Severe Malnutrition Etiology: social / environmental circumstances (h/o bipolar disorder, anxiety, depression) Signs/Symptoms: percent weight loss, moderate fat depletion, mild muscle depletion, energy intake < or equal to 50% for > or equal to 1 month Interventions: Magic cup, MVI, Education  No intake or output data in the 24 hours ending 11/24/21 1108  Net IO Since Admission: 630.91 mL [11/24/21 1108]  Wt Readings from Last 3 Encounters:  11/22/21 46.2 kg  11/10/21 45.8 kg  11/07/21 42.6 kg  Unresulted Labs (From admission, onward)     Start     Ordered   11/25/21 0500  Protime-INR  Daily at 5am,   R     Question:  Specimen collection method  Answer:  Lab=Lab collect   11/24/21 0914   11/23/21 5784  Urine Culture  (Urine Culture)  Add-on,   AD       Question:  Indication  Answer:  Dysuria   11/23/21 0927          Data Reviewed: I have personally reviewed following labs and imaging studies CBC: Recent Labs  Lab 11/21/21 1940 11/22/21 1406 11/23/21 0702 11/24/21 0619  WBC 10.6*  --  6.3 6.2  NEUTROABS 6.1  --   --   --   HGB 12.6 10.9* 10.6* 10.6*  HCT  40.2 35.1* 34.4* 33.7*  MCV 91.4  --  93.2 92.3  PLT 157  --  114* 104*    Basic Metabolic Panel: Recent Labs  Lab 11/21/21 1940 11/23/21 0702  NA 142 144  K 4.3 4.0  CL 112* 116*  CO2 24 25  GLUCOSE 117* 94  BUN 9 11  CREATININE 0.93 0.79  CALCIUM 9.0 8.4*    GFR: Estimated Creatinine Clearance: 51.8 mL/min (by C-G formula based on SCr of 0.79 mg/dL). Liver Function Tests: Recent Labs  Lab 11/21/21 1940 11/23/21 0702  AST 11* 9*  ALT 7 6  ALKPHOS 81 67  BILITOT 0.4 0.6  PROT 6.5 5.2*  ALBUMIN 3.7 2.9*    Recent Labs  Lab 11/21/21 1940  LIPASE 29    Recent Labs  Lab 11/23/21 1021  AMMONIA 131*    Coagulation Profile: Recent Labs  Lab 11/21/21 1940 11/22/21 1406 11/23/21 0702 11/24/21 0619  INR 3.6* 4.2* 2.5* 1.8*    BNP (last 3 results) No results for input(s): "PROBNP" in the last 8760 hours. HbA1C: No results for input(s): "HGBA1C" in the last 72 hours. CBG: No results for input(s): "GLUCAP" in the last 168 hours. Lipid Profile: No results for input(s): "CHOL", "HDL", "LDLCALC", "TRIG", "CHOLHDL", "LDLDIRECT" in the last 72 hours. Thyroid Function Tests: Recent Labs    11/23/21 1021  TSH 0.673    Sepsis Labs: No results for input(s): "PROCALCITON", "LATICACIDVEN" in the last 168 hours.  Recent Results (from the past 240 hour(s))  SARS Coronavirus 2 by RT PCR (hospital order, performed in [facility] hospital lab) *cepheid single result test* Anterior Nasal Swab     Status: None   Collection Time: 11/22/21  2:35 AM   Specimen: Anterior Nasal Swab  Result Value Ref Range Status   SARS Coronavirus 2 by RT PCR NEGATIVE NEGATIVE Final    Comment: (NOTE) SARS-CoV-2 target nucleic acids are NOT DETECTED.  The SARS-CoV-2 RNA is generally detectable in upper and lower respiratory specimens during the acute phase of infection. The lowest concentration of SARS-CoV-2 viral copies this assay can detect is 250 copies / mL. A negative result  does not preclude SARS-CoV-2 infection and should not be used as the sole basis for treatment or other patient management decisions.  A negative result may occur with improper specimen collection / handling, submission of specimen other than nasopharyngeal swab, presence of viral mutation(s) within the areas targeted by this assay, and inadequate number of viral copies (<250 copies / mL). A negative result must be combined with clinical observations, patient history, and epidemiological information.  Fact Sheet for Patients:   https://www.patel.info/  Fact Sheet for Healthcare Providers: https://hall.com/  This test is not yet approved or  cleared by the [place] and has been authorized for detection and/or diagnosis of SARS-CoV-2 by FDA under an Emergency Use Authorization (EUA).  This EUA will remain in effect (meaning this test can be used) for the duration of the COVID-19 declaration under Section 564(b)(1) of the Act, 21 U.S.C. section 360bbb-3(b)(1), unless the authorization is terminated or revoked sooner.  Performed at [facility], [place] [address] [place], [place] [address]     Antimicrobials: Anti-infectives (From admission, onward)    Start     Dose/Rate Route Frequency Ordered Stop   11/22/21 1245  cefTRIAXone (ROCEPHIN) 1 g in sodium chloride 0.9 % 100 mL IVPB        1 g 200 mL/hr over 30 Minutes Intravenous Daily 11/22/21 1118     11/22/21 0600  aztreonam (AZACTAM) 0.5 g in dextrose 5 % 50 mL IVPB  Status:  Discontinued        0.5 g 105 mL/hr over 30 Minutes Intravenous Every 8 hours 11/22/21 0402 11/22/21 0409   11/22/21 0415  aztreonam (AZACTAM) 1 g in sodium chloride 0.9 % 100 mL IVPB  Status:  Discontinued        1 g 200 mL/hr over 30 Minutes Intravenous Every 8 hours 11/22/21 0409 11/22/21 1118      Culture/Microbiology  No results found.   LOS: 1 day   Antonieta Pert, MD [facility]  11/24/2021, 11:08 AM

## 2021-11-24 NOTE — Progress Notes (Addendum)
[place] for warfarin Indication:  VTE treatment  Allergies  Allergen Reactions   Antibiotic Ear [Neomycin-Polymyxin-Hc] Other (See Comments)    Unknown reaction   Penicillins Other (See Comments)    Yeast infection Did it involve swelling of the face/tongue/throat, SOB, or low BP? No Did it involve sudden or severe rash/hives, skin peeling, or any reaction on the inside of your mouth or nose? No Did you need to seek medical attention at a hospital or doctor's office? Yes When did it last happen? More than 5 years ago  If all above answers are "NO", may proceed with cephalosporin use.     Patient Measurements: Height: '5\' 7"'$  (170.2 cm) Weight: 46.2 kg (101 lb 14 oz) IBW/kg (Calculated) : 61.6  Vital Signs: Temp: 98 F (36.7 C) (09/08 0623) Temp Source: Oral (09/08 0623) BP: 125/81 (09/08 0623) Pulse Rate: 81 (09/08 0623)  Labs: Recent Labs    11/21/21 1940 11/22/21 1406 11/23/21 0702 11/24/21 0619  HGB 12.6 10.9* 10.6* 10.6*  HCT 40.2 35.1* 34.4* 33.7*  PLT 157  --  114* 104*  LABPROT 35.2* 39.9* 26.6* 20.3*  INR 3.6* 4.2* 2.5* 1.8*  CREATININE 0.93  --  0.79  --      Estimated Creatinine Clearance: 51.8 mL/min (by C-G formula based on SCr of 0.79 mg/dL).  Medications: Warfarin 1 mg PO daily PTA -Last dose: 9/5 -Pt stated she was told to HOLD her dose after 9/5 as still trying to figure out therapeutic dose  Assessment: Pt is a 69 yoF who was recently started on warfarin.   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 Appears there was also concern for antiphospholipid syndrome. Pt was discharged on 11/01/21 on warfarin with enoxaparin bridge.   Patient had elevated INR (5.6) on outpatient follow up on 8/18 but clinic was  unable to reach patient or daughter regarding results. Pt then presented to the ED on 8/22 with INR >10. Pt reported accidentally taking warfarin TWICE daily for several days during this time period in addition to being on enoxaparin bridge. Pt received vitamin K and was discharged on 8/23. She presented back to the ED on 8/25 PM with hematuria and supratherapeutic INR taking warfarin once daily. Pt was given vitamin K x1 dose and discharged with instructions to follow up with PCP for INR follow up and new warfarin dosing.   INR was slightly elevated (3.6) on presentation to ED on 9/5. Most recent dosing regimen reported as 1 mg PO daily. Pharmacy consulted to monitor INR and dose warfarin while inpatient.   Today, 11/24/21 INR now slightly low today after holding warfarin 9/6 and resuming 50% dose 9/7 CBC: Hgb slightly low but stable; Plt low and decreased Diet: Heart healthy; meal intake 100% when charted; albumin low. DDI: Divalproex may enhance effects of warfarin - home medication being continued. On ceftriaxone - not a major DDI but has some potential to enhance effects of warfarin.  Goal of Therapy:  INR 2-3 Monitor platelets by anticoagulation protocol: Yes   Plan:  Warfarin 1 mg PO today INR daily CBC with AM labs tomorrow Adding Lovenox 1 mg/kg SQ bid while INR subtherapeutic If patient were to discharge today, would recommend discharging on reduced dose of warfarin 0.5 mg PO on Thurs,  Sun; 1 mg all other days of the week. Recommend follow up with outpatient clinic for INR check within 48 hours of hospital discharge to guide further dosing.   [name], Cindie Laroche, PharmD 11/24/2021,12:26 PM

## 2021-11-24 NOTE — Progress Notes (Signed)
Mobility Specialist - Progress Note   11/24/21 1315  Mobility  HOB Elevated/Bed Position Self regulated  Activity Ambulated independently in hallway  Range of Motion/Exercises Active  Level of Assistance Independent  Assistive Device None  Distance Ambulated (ft) 120 ft  Activity Response Tolerated well  Transport method Ambulatory  $Mobility charge 1 Mobility   Pt received in bed and agreeable to mobility. No complaints during ambulation. Pt to bed after session with all needs met.    [facility]

## 2021-11-24 NOTE — Plan of Care (Signed)

## 2021-11-25 ENCOUNTER — Other Ambulatory Visit: Payer: Self-pay | Admitting: Physician Assistant

## 2021-11-25 ENCOUNTER — Other Ambulatory Visit (HOSPITAL_COMMUNITY): Payer: Self-pay

## 2021-11-25 DIAGNOSIS — G9340 Encephalopathy, unspecified: Secondary | ICD-10-CM | POA: Diagnosis not present

## 2021-11-25 LAB — PROTIME-INR
INR: 1.5 — ABNORMAL HIGH (ref 0.8–1.2)
Prothrombin Time: 18.2 seconds — ABNORMAL HIGH (ref 11.4–15.2)

## 2021-11-25 MED ORDER — ENOXAPARIN (LOVENOX) PATIENT EDUCATION KIT
PACK | Freq: Once | Status: AC
  Filled 2021-11-25: qty 1

## 2021-11-25 MED ORDER — ENOXAPARIN SODIUM 60 MG/0.6ML IJ SOSY
50.0000 mg | PREFILLED_SYRINGE | Freq: Two times a day (BID) | INTRAMUSCULAR | Status: DC
  Administered 2021-11-25 – 2021-11-26 (×2): 50 mg via SUBCUTANEOUS
  Filled 2021-11-25 (×2): qty 0.6

## 2021-11-25 MED ORDER — ENOXAPARIN SODIUM 60 MG/0.6ML IJ SOSY
50.0000 mg | PREFILLED_SYRINGE | Freq: Two times a day (BID) | INTRAMUSCULAR | 0 refills | Status: DC
  Filled 2021-11-25: qty 9.6, 8d supply, fill #0
  Filled 2021-11-25: qty 20.4, 22d supply, fill #0

## 2021-11-25 MED ORDER — LACTULOSE 10 GM/15ML PO SOLN
10.0000 g | Freq: Two times a day (BID) | ORAL | 0 refills | Status: DC | PRN
  Filled 2021-11-25: qty 236, 8d supply, fill #0

## 2021-11-25 NOTE — Progress Notes (Signed)
PROGRESS NOTE [name]  IWL:[id number] DOB: [birth year] DOA: 11/21/2021 PCP: Lujean Amel, MD   Brief Narrative/Hospital Course: 66 y.o.f w/ anxiety/depression/bipolar disorder/ADHD, headaches, cerebral venous thrombosis of sigmoid sinus, diagnosed with PE/DVT of left peroneal vein/left jugular vein thrombosis at a [place] facility about a month ago, cervical spondylosis, degeneration of the lumbar intervertebral disc, lumbar spine fusion, dysphagia, dysphagia, history of rhabdomyolysis, history of healthcare associated pneumonia, sepsis due to pneumonia, hypoglycemia laryngopharyngeal reflux left ovarian cyst, memory loss, myositis, right knee pain, lower extremity weakness, retroperitoneal fibrosis sciatica, seizure-like activity, sinus pauses, history of pacemaker placement who was recently admitted and discharged for supratherapeutic INR twice in the past 2 weeks who is brought to the ED due to being found by her daughter with food waste in bed, had not cleaned up for the last 2 days,confusion associated with lower abdominal pain and bilateral lower extremity weakness. Per chart: 8/7-8/16/2023, she was hospitalized at [facility] in [place] while she was visiting there.  Per history, she initially presented with seizure-like activity and started on Depakote. Continues video EEG showed bilateral and focal cerebral dysfunction and focal cortical irritability which improved with AED.  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  She was initiated on Lovenox and subsequently started on Coumadin with plan to continue both until INR was therapeutic.  It seems there was a concern for antiphospholipid syndrome because of which Coumadin and not  DOAC were chosen- she had elevated cardiolipin antibody   In the ED: Initial vital signs  stable HR 113. UA- moderate leukocyte esterase and proteinuria.  White count 10.6, hemoglobin 12.6 g/dL platelets 157.  PT 35.2 and INR 3.6.Coronavirus PCR was negative.Lipase was normal.CMP showed a chloride of 112 mmol/L, glucose 117 mg/dL and AST decreased at 11 units/L.  The rest of the CMP measurements were unremarkable.  Valproic acid level was within therapeutic range. CXR-no active disease. CT head was contrast with no acute intracranial abnormality. She was admitted with CONFUSION with UTI, supratherapeutic INR.  Since admission mental status has normalized-pharmacy dosing Coumadin     Subjective: Seen and examined.  Alert awake resting comfortably no new complaints Tried to do Lovenox injection with nursing somewhat shaky  Assessment and Plan:  Acute encephalopathy UTI Hyperammonemia: Mental status improved.  Patient was confused at home-treating for UTI with empiric antibiotics.  Ammonia is elevated likely contributing to her altered mental status, no liver dysfunction on labs, CT imaging previously showed no acute finding on the liver .having bowel movement on lactulose.  Continue the same continue antibiotics.  CT head on admission no acute finding.  Overall stable  Supratherapeutic INR History of pulmonary embolism: INR is now subtherapeutic.Formally consulted hematology oncology given her difficulty with Coumadin management due to concern for antiphospholipid syndrome unable to use DOAC.  Recommending Lovenox, over educating patient to ensure that she can safely do Lovenox injections at home, call her daughter-no answer, hopefully daughter can also learn to use Lovenox injection at home Recent Labs  Lab 11/21/21 1940 11/22/21 1406 11/23/21 0702 11/24/21 0619 11/25/21 0758  INR 3.6* 4.2* 2.5* 1.8* 1.5*     Tobacco abuse:Continue nicotine patch  Bipolar disorder  Depression with  anxiety: Stable, continue her Depakote, amitriptyline, risperidone and alprazolam.  Asking for  Ambien for home will defer to her PCP/psychiatry  Degeneration of lumbar intervertebral disc: Continue tizanidine pain control  Underweight: BMI 15 augment nutritional status.  Dietitian to follow-up  Hyperlipidemia: Aortic atherosclerosis: started on low-dose statin.Outpatient follow-up with PCP will be needed  DVT prophylaxis:  Code Status:   Code Status: Full Code Family Communication: plan of care discussed with patient at bedside. Level of care: Med-Surg remains hospitalized due to management of subtherapeutic INR at this time Dispo: The patient is from: home. ambulatory            Anticipated disposition: home once she learns  how to administer Lovenox injection herself for her daughter   Mobility Assessment (last 72 hours)     Mobility Assessment     Row Name 11/24/21 2009 11/24/21 0900 11/23/21 2023 11/23/21 1030 11/22/21 1300   Does patient have an order for bedrest or is patient medically unstable No - Continue assessment No - Continue assessment No - Continue assessment No - Continue assessment No - Continue assessment   What is the highest level of mobility based on the progressive mobility assessment? Level 6 (Walks independently in room and hall) - Balance while walking in room without assist - Complete Level 6 (Walks independently in room and hall) - Balance while walking in room without assist - Complete Level 6 (Walks independently in room and hall) - Balance while walking in room without assist - Complete Level 6 (Walks independently in room and hall) - Balance while walking in room without assist - Complete Level 6 (Walks independently in room and hall) - Balance while walking in room without assist - Complete             Objective: Vitals last 24 hrs: Vitals:   11/24/21 0623 11/24/21 1330 11/24/21 1938 11/25/21 0603  BP: 125/81 122/78 118/67 111/84  Pulse: 81 86 87  71  Resp: '16 16 18 16  '$ Temp: 98 F (36.7 C)  98.4 F (36.9 C)   TempSrc: Oral  Oral   SpO2: 95% 95% 97% 97%  Weight:      Height:       Weight change:   Physical Examination: General exam: AAOX3, older than stated age, weak appearing. HEENT:Oral mucosa moist, Ear/Nose WNL grossly, dentition normal. Respiratory system: bilaterally CLEAR, no use of accessory muscle Cardiovascular system: S1 & S2 +, No JVD,. Gastrointestinal system: Abdomen soft,NT,ND,BS+ Nervous System:Alert, awake, moving extremities and grossly nonfocal Extremities: LE ankle edema NEG, distal peripheral pulses palpable.  Skin: No rashes,no icterus. MSK: Normal muscle bulk,tone, power   Medications reviewed:  Scheduled Meds:  atorvastatin  10 mg Oral Daily   divalproex  250 mg Oral TID   enoxaparin (LOVENOX) injection  1 mg/kg Subcutaneous BID   lactulose  10 g Oral BID   multivitamin with minerals  1 tablet Oral Daily   nicotine  14 mg Transdermal Daily   risperiDONE  3 mg Oral QHS   zolpidem  5 mg Oral QHS   Continuous Infusions:  cefTRIAXone (ROCEPHIN)  IV 1 g (11/25/21 0931)      Diet Order             Diet Heart Room service appropriate? Yes; Fluid consistency: Thin  Diet effective now                    Nutrition Problem: Severe Malnutrition Etiology: social / environmental circumstances (h/o bipolar disorder, anxiety, depression) Signs/Symptoms: percent weight loss, moderate fat depletion, mild muscle depletion,  energy intake < or equal to 50% for > or equal to 1 month Interventions: Magic cup, MVI, Education   Intake/Output Summary (Last 24 hours) at 11/25/2021 1109 Last data filed at 11/24/2021 1330 Gross per 24 hour  Intake 240 ml  Output --  Net 240 ml    Net IO Since Admission: 870.91 mL [11/25/21 1109]  Wt Readings from Last 3 Encounters:  11/22/21 46.2 kg  11/10/21 45.8 kg  11/07/21 42.6 kg     Unresulted Labs (From admission, onward)    None     Data Reviewed: I  have personally reviewed following labs and imaging studies CBC: Recent Labs  Lab 11/21/21 1940 11/22/21 1406 11/23/21 0702 11/24/21 0619  WBC 10.6*  --  6.3 6.2  NEUTROABS 6.1  --   --   --   HGB 12.6 10.9* 10.6* 10.6*  HCT 40.2 35.1* 34.4* 33.7*  MCV 91.4  --  93.2 92.3  PLT 157  --  114* 104*    Basic Metabolic Panel: Recent Labs  Lab 11/21/21 1940 11/23/21 0702  NA 142 144  K 4.3 4.0  CL 112* 116*  CO2 24 25  GLUCOSE 117* 94  BUN 9 11  CREATININE 0.93 0.79  CALCIUM 9.0 8.4*    GFR: Estimated Creatinine Clearance: 51.8 mL/min (by C-G formula based on SCr of 0.79 mg/dL). Liver Function Tests: Recent Labs  Lab 11/21/21 1940 11/23/21 0702  AST 11* 9*  ALT 7 6  ALKPHOS 81 67  BILITOT 0.4 0.6  PROT 6.5 5.2*  ALBUMIN 3.7 2.9*    Recent Labs  Lab 11/21/21 1940  LIPASE 29    Recent Labs  Lab 11/23/21 1021  AMMONIA 131*    Coagulation Profile: Recent Labs  Lab 11/21/21 1940 11/22/21 1406 11/23/21 0702 11/24/21 0619 11/25/21 0758  INR 3.6* 4.2* 2.5* 1.8* 1.5*    BNP (last 3 results) No results for input(s): "PROBNP" in the last 8760 hours. HbA1C: No results for input(s): "HGBA1C" in the last 72 hours. CBG: No results for input(s): "GLUCAP" in the last 168 hours. Lipid Profile: No results for input(s): "CHOL", "HDL", "LDLCALC", "TRIG", "CHOLHDL", "LDLDIRECT" in the last 72 hours. Thyroid Function Tests: Recent Labs    11/23/21 1021  TSH 0.673    Sepsis Labs: No results for input(s): "PROCALCITON", "LATICACIDVEN" in the last 168 hours.  Recent Results (from the past 240 hour(s))  SARS Coronavirus 2 by RT PCR (hospital order, performed in [facility] hospital lab) *cepheid single result test* Anterior Nasal Swab     Status: None   Collection Time: 11/22/21  2:35 AM   Specimen: Anterior Nasal Swab  Result Value Ref Range Status   SARS Coronavirus 2 by RT PCR NEGATIVE NEGATIVE Final    Comment: (NOTE) SARS-CoV-2 target nucleic acids  are NOT DETECTED.  The SARS-CoV-2 RNA is generally detectable in upper and lower respiratory specimens during the acute phase of infection. The lowest concentration of SARS-CoV-2 viral copies this assay can detect is 250 copies / mL. A negative result does not preclude SARS-CoV-2 infection and should not be used as the sole basis for treatment or other patient management decisions.  A negative result may occur with improper specimen collection / handling, submission of specimen other than nasopharyngeal swab, presence of viral mutation(s) within the areas targeted by this assay, and inadequate number of viral copies (<250 copies / mL). A negative result must be combined with clinical observations, patient history, and epidemiological information.  Fact Sheet  for Patients:   https://www.patel.info/  Fact Sheet for Healthcare Providers: https://hall.com/  This test is not yet approved or  cleared by the [place] FDA and has been authorized for detection and/or diagnosis of SARS-CoV-2 by FDA under an Emergency Use Authorization (EUA).  This EUA will remain in effect (meaning this test can be used) for the duration of the COVID-19 declaration under Section 564(b)(1) of the Act, 21 U.S.C. section 360bbb-3(b)(1), unless the authorization is terminated or revoked sooner.  Performed at [facility], [place] [address], [place], [place] [address]   Urine Culture     Status: None   Collection Time: 11/22/21 10:54 AM   Specimen: Urine, Clean Catch  Result Value Ref Range Status   Specimen Description   Final    URINE, CLEAN CATCH Performed at [facility], [place] [address] [place], [place] [address]    Special Requests   Final    NONE Performed at [facility], [place] [address] [place], [place] [address]    Culture   Final    NO GROWTH Performed at [facility], [place] [address] [place], [place] [address]    Report Status 11/24/2021 FINAL  Final    Antimicrobials: Anti-infectives (From admission, onward)    Start     Dose/Rate Route Frequency Ordered Stop   11/22/21 1245  cefTRIAXone (ROCEPHIN) 1 g in sodium chloride 0.9 % 100 mL IVPB        1 g 200 mL/hr over 30 Minutes Intravenous Daily 11/22/21 1118 11/27/21 0959   11/22/21 0600  aztreonam (AZACTAM) 0.5 g in dextrose 5 % 50 mL IVPB  Status:  Discontinued        0.5 g 105 mL/hr over 30 Minutes Intravenous Every 8 hours 11/22/21 0402 11/22/21 0409   11/22/21 0415  aztreonam (AZACTAM) 1 g in sodium chloride 0.9 % 100 mL IVPB  Status:  Discontinued        1 g 200 mL/hr over 30 Minutes Intravenous Every 8 hours 11/22/21 0409 11/22/21 1118      Culture/Microbiology  No results found.   LOS: 2 days   Antonieta Pert, MD [facility]  11/25/2021, 11:09 AM

## 2021-11-25 NOTE — Progress Notes (Signed)
Mobility Specialist Cancellation/Refusal Note:    11/25/21 1223  Mobility  Activity Refused mobility     Reason for Cancellation/Refusal: Pt declined mobility at this time. Pt wants to wait until after lunch has arrived.  Will check back as schedule permits.       [facility]

## 2021-11-25 NOTE — Plan of Care (Signed)

## 2021-11-25 NOTE — Progress Notes (Signed)
Mobility Specialist - Progress Note   11/25/21 1433  Mobility  HOB Elevated/Bed Position Self regulated  Activity Ambulated independently in hallway  Range of Motion/Exercises Active  Level of Assistance Independent  Assistive Device None  Distance Ambulated (ft) 175 ft  Activity Response Tolerated well  Transport method Ambulatory  $Mobility charge 1 Mobility   Pt received in bed and agreeable to mobility. Pt to bed after session with all needs met.    [facility]

## 2021-11-26 DIAGNOSIS — G9340 Encephalopathy, unspecified: Secondary | ICD-10-CM | POA: Diagnosis not present

## 2021-11-26 NOTE — Plan of Care (Signed)

## 2021-11-26 NOTE — Discharge Summary (Signed)
Physician Discharge Summary  [name]:[id number] DOB: Apr 10, 1955 DOA: 11/21/2021  PCP: Lujean Amel, MD  Admit date: 11/21/2021 Discharge date: 11/26/2021 Recommendations for Outpatient Follow-up:  Follow up with PCP in 1 weeks-call for appointment Please obtain BMP/CBC in one week  Discharge Dispo: home Discharge Condition: Stable Code Status:   Code Status: Full Code Diet recommendation:  Diet Order             Diet Heart Room service appropriate? Yes; Fluid consistency: Thin  Diet effective now                   Brief/Interim Summary: 66 y.o.f w/ anxiety/depression/bipolar disorder/ADHD, headaches, cerebral venous thrombosis of sigmoid sinus, diagnosed with PE/DVT of left peroneal vein/left jugular vein thrombosis at a [place] facility about a month ago, cervical spondylosis, degeneration of the lumbar intervertebral disc, lumbar spine fusion, dysphagia, dysphagia, history of rhabdomyolysis, history of healthcare associated pneumonia, sepsis due to pneumonia, hypoglycemia laryngopharyngeal reflux left ovarian cyst, memory loss, myositis, right knee pain, lower extremity weakness, retroperitoneal fibrosis sciatica, seizure-like activity, sinus pauses, history of pacemaker placement who was recently admitted and discharged for supratherapeutic INR twice in the past 2 weeks who is brought to the ED due to being found by her daughter with food waste in bed, had not cleaned up for the last 2 days,confusion associated with lower abdominal pain and bilateral lower extremity weakness. Per chart: 8/7-8/16/2023, she was hospitalized at [facility] in [place] while she was visiting there.  Per history, she initially presented with seizure-like activity and started on Depakote. Continues video EEG showed bilateral and focal cerebral dysfunction and focal cortical irritability which improved with AED.  While in the hospital, she was also found to have lobar, segmental,  subsegmental PE within the right lung, left lower extremity DVT as well as nonocclusive dural venous sinus thrombosis involving the left transverse sinus and sigmoid sinus extending into the proximal left internal jugular vein.  She was initiated on Lovenox and subsequently started on Coumadin with plan to continue both until INR was therapeutic.  It seems there was a concern for antiphospholipid syndrome because of which Coumadin and not DOAC were chosen- she had elevated cardiolipin antibody   In the ED: Initial vital signs  stable HR 113. UA- moderate leukocyte esterase and proteinuria.  White count 10.6, hemoglobin 12.6 g/dL platelets 157.  PT 35.2 and INR 3.6.Coronavirus PCR was negative.Lipase was normal.CMP showed a chloride of 112 mmol/L, glucose 117 mg/dL and AST decreased at 11 units/L.  The rest of the CMP measurements were unremarkable.  Valproic acid level was within therapeutic range. CXR-no active disease. CT head was contrast with no acute intracranial abnormality. She was admitted with CONFUSION with UTI, supratherapeutic INR.  Since admission mental status has normalized-pharmacy dosing Coumadin.  Seen by hematology oncology advised to continue Lovenox patient has been kept in the hospital to ensure that she is able to do injection.  Lovenox has been arranged.  Patient will be completing 5 days of antibiotics urine culture unremarkable mental status alert awake oriented at baseline.      Discharge Diagnoses:  Principal Problem:   Acute encephalopathy Active Problems:   Supratherapeutic INR   History of pulmonary embolism   Tobacco abuse   Bipolar disorder (HCC)   Depression with anxiety   Degeneration of lumbar intervertebral disc   Underweight   Aortic atherosclerosis (HCC)   Hyperlipidemia   Acute cystitis  Acute encephalopathy UTI Hyperammonemia: Mental  status improved.  Patient was confused at home-treating for UTI with empiric antibiotics.  Ammonia is elevated likely  contributing to her altered mental status, no liver dysfunction on labs, CT imaging previously showed no acute finding on the liver .having bowel movement on lactulose.  Complete antibiotics.CT head on admission no acute finding.  Overall stable mentation and ready for discharge   Supratherapeutic INR History of pulmonary embolism: INR is now subtherapeutic.Formally consulted hematology oncology given her difficulty with Coumadin management due to concern for antiphospholipid syndrome unable to use DOAC.  Recommending Lovenox, she has been educated, able to dose Lovenox by herself,daughter will also Learn prior to d/c today.   Tobacco abuse:Continue nicotine patch   Bipolar disorder  Depression with anxiety: Stable, continue her Depakote, amitriptyline, risperidone and alprazolam.  Asking for Ambien for home will defer to her PCP/psychiatry   Degeneration of lumbar intervertebral disc: Continue tizanidine pain control   Underweight/Severe malnutrition:BMI 15 augment nutritional status. Nutrition Problem: Severe Malnutrition Etiology: social / environmental circumstances (h/o bipolar disorder, anxiety, depression) Signs/Symptoms: percent weight loss, moderate fat depletion, mild muscle depletion, energy intake < or equal to 50% for > or equal to 1 month Interventions: Magic cup, MVI, Education    Hyperlipidemia: Aortic atherosclerosis: started on low-dose statin.Outpatient follow-up with PCP will be needed  Consults: Heme-onc Subjective: Alert awake oriented at baseline daughter at the bedside eager to go home today, able to self administer Lovenox  Discharge Exam: Vitals:   11/25/21 2017 11/26/21 0508  BP: 117/87 131/83  Pulse: 78 82  Resp: 18 18  Temp: 99 F (37.2 C) 98 F (36.7 C)  SpO2: 95% 97%   General: Pt is alert, awake, not in acute distress Cardiovascular: RRR, S1/S2 +, no rubs, no gallops Respiratory: CTA bilaterally, no wheezing, no rhonchi Abdominal: Soft, NT,  ND, bowel sounds + Extremities: no edema, no cyanosis  Discharge Instructions  Discharge Instructions     Discharge instructions   Complete by: As directed    Please call call MD or return to ER for similar or worsening recurring problem that brought you to hospital or if any fever,nausea/vomiting,abdominal pain, uncontrolled pain, chest pain,  shortness of breath or any other alarming symptoms.  Please follow-up your doctor as instructed in a week time and call the office for appointment.  Please avoid alcohol, smoking, or any other illicit substance and maintain healthy habits including taking your regular medications as prescribed.  You were cared for by a hospitalist during your hospital stay. If you have any questions about your discharge medications or the care you received while you were in the hospital after you are discharged, you can call the unit and ask to speak with the hospitalist on call if the hospitalist that took care of you is not available.  Once you are discharged, your primary care physician will handle any further medical issues. Please note that NO REFILLS for any discharge medications will be authorized once you are discharged, as it is imperative that you return to your primary care physician (or establish a relationship with a primary care physician if you do not have one) for your aftercare needs so that they can reassess your need for medications and monitor your lab values   Increase activity slowly   Complete by: As directed       Allergies as of 11/26/2021       Reactions   Antibiotic Ear [neomycin-polymyxin-hc] Other (See Comments)   Unknown reaction   Penicillins Other (See  Comments)   Yeast infection Did it involve swelling of the face/tongue/throat, SOB, or low BP? No Did it involve sudden or severe rash/hives, skin peeling, or any reaction on the inside of your mouth or nose? No Did you need to seek medical attention at a hospital or doctor's  office? Yes When did it last happen? More than 5 years ago  If all above answers are "NO", may proceed with cephalosporin use.        Medication List     TAKE these medications    ALPRAZolam 1 MG tablet Commonly known as: Xanax Take 1 tablet (1 mg total) by mouth 3 (three) times daily as needed for anxiety. Must last at least 30 days. What changed:  how much to take when to take this   amitriptyline 25 MG tablet Commonly known as: ELAVIL Take 2 tablets (50 mg total) by mouth at bedtime. What changed:  how much to take when to take this reasons to take this   DayVigo 10 MG Tabs Generic drug: Lemborexant Take 10 mg by mouth at bedtime.   divalproex 250 MG DR tablet Commonly known as: DEPAKOTE Take 250 mg by mouth in the morning, at noon, and at bedtime.   enoxaparin 60 MG/0.6ML injection Commonly known as: LOVENOX Inject 0.5 mLs (50 mg total) into the skin 2 (two) times daily.   hydrOXYzine 50 MG tablet Commonly known as: ATARAX TAKE 1-2 TABLETS (50-100 MG TOTAL) BY MOUTH AT BEDTIME AS NEEDED. What changed:  how much to take reasons to take this   lactulose 10 GM/15ML solution Commonly known as: CHRONULAC Take 15 mLs (10 g total) by mouth 2 (two) times daily as needed for mild constipation. Titrate for 2-3 BM daily. Hold if loose stool.   ondansetron 8 MG tablet Commonly known as: ZOFRAN Take 8 mg by mouth 2 (two) times daily as needed for nausea/vomiting.   Oxycodone HCl 10 MG Tabs Take 10 mg by mouth daily as needed for pain.   risperiDONE 3 MG tablet Commonly known as: RISPERDAL TAKE 1 TABLET BY MOUTH AT BEDTIME.   tiZANidine 4 MG tablet Commonly known as: ZANAFLEX Take 4 mg by mouth every 8 (eight) hours as needed for muscle spasms.   warfarin 1 MG tablet Commonly known as: COUMADIN Take 1 mg by mouth daily.        Follow-up Information     Nicholas Lose, MD. Call in 1 week(s).   Specialty: Hematology and Oncology Contact  information: [place] [id number] [phone]         Lujean Amel, MD Follow up in 1 week(s).   Specialty: Family Medicine Contact information: [address] [place] [address] [phone]                Allergies  Allergen Reactions   Antibiotic Ear [Neomycin-Polymyxin-Hc] Other (See Comments)    Unknown reaction   Penicillins Other (See Comments)    Yeast infection Did it involve swelling of the face/tongue/throat, SOB, or low BP? No Did it involve sudden or severe rash/hives, skin peeling, or any reaction on the inside of your mouth or nose? No Did you need to seek medical attention at a hospital or doctor's office? Yes When did it last happen? More than 5 years ago  If all above answers are "NO", may proceed with cephalosporin use.     The results of significant diagnostics from this hospitalization (including imaging, microbiology, ancillary and laboratory) are  listed below for reference.    Microbiology: Recent Results (from the past 240 hour(s))  SARS Coronavirus 2 by RT PCR (hospital order, performed in [facility] hospital lab) *cepheid single result test* Anterior Nasal Swab     Status: None   Collection Time: 11/22/21  2:35 AM   Specimen: Anterior Nasal Swab  Result Value Ref Range Status   SARS Coronavirus 2 by RT PCR NEGATIVE NEGATIVE Final    Comment: (NOTE) SARS-CoV-2 target nucleic acids are NOT DETECTED.  The SARS-CoV-2 RNA is generally detectable in upper and lower respiratory specimens during the acute phase of infection. The lowest concentration of SARS-CoV-2 viral copies this assay can detect is 250 copies / mL. A negative result does not preclude SARS-CoV-2 infection and should not be used as the sole basis for treatment or other patient management decisions.  A negative result may occur with improper specimen collection / handling, submission of specimen other than nasopharyngeal  swab, presence of viral mutation(s) within the areas targeted by this assay, and inadequate number of viral copies (<250 copies / mL). A negative result must be combined with clinical observations, patient history, and epidemiological information.  Fact Sheet for Patients:   https://www.patel.info/  Fact Sheet for Healthcare Providers: https://hall.com/  This test is not yet approved or  cleared by the [place] FDA and has been authorized for detection and/or diagnosis of SARS-CoV-2 by FDA under an Emergency Use Authorization (EUA).  This EUA will remain in effect (meaning this test can be used) for the duration of the COVID-19 declaration under Section 564(b)(1) of the Act, 21 U.S.C. section 360bbb-3(b)(1), unless the authorization is terminated or revoked sooner.  Performed at [facility], [place] [address], [place], [place] [address]   Urine Culture     Status: None   Collection Time: 11/22/21 10:54 AM   Specimen: Urine, Clean Catch  Result Value Ref Range Status   Specimen Description   Final    URINE, CLEAN CATCH Performed at [facility], [place] [address] [place], [place] [address]    Special Requests   Final    NONE Performed at [facility], [place] [address] [place], [place] [address]    Culture   Final    NO GROWTH Performed at [facility], [place] [address], [place], [place] [address]    Report Status 11/24/2021 FINAL  Final    Procedures/Studies: DG Chest Portable 1 View  Result Date: 11/22/2021 CLINICAL DATA:  Altered mental status EXAM: PORTABLE CHEST 1 VIEW COMPARISON:  None Available. FINDINGS: Lungs are clear. No pneumothorax or pleural effusion. Cardiac size within normal limits on this semi erect examination. Left subclavian dual lead pacemaker is unchanged. Pulmonary vascularity is normal. No acute bone abnormality. IMPRESSION: No active disease.  Electronically Signed   By: Fidela Salisbury M.D.   On: 11/22/2021 02:59   CT Head Wo Contrast  Result Date: 11/21/2021 CLINICAL DATA:  Mental status change, unknown cause EXAM: CT HEAD WITHOUT CONTRAST TECHNIQUE: Contiguous axial images were obtained from the base of the skull through the vertex without intravenous contrast. RADIATION DOSE REDUCTION: This exam was performed according to the departmental dose-optimization program which includes automated exposure control, adjustment of the mA and/or kV according to patient size and/or use of iterative reconstruction technique. COMPARISON:  Head CT 05/03/2021 FINDINGS: Brain: No intracranial hemorrhage, mass effect, or midline shift. Stable degree of atrophy. No hydrocephalus. The basilar cisterns are patent. No evidence of territorial  infarct or acute ischemia. No extra-axial or intracranial fluid collection. Vascular: Atherosclerosis of skullbase vasculature without hyperdense vessel or abnormal calcification. Skull: No fracture or focal lesion. Sinuses/Orbits: Paranasal sinuses and mastoid air cells are clear. The visualized orbits are unremarkable. Other: None. IMPRESSION: No acute intracranial abnormality. Electronically Signed   By: Keith Rake M.D.   On: 11/21/2021 20:45   CT CHEST ABDOMEN PELVIS W CONTRAST  Result Date: 11/13/2021 CLINICAL DATA:  History of uterine/cervical cancer status post total hysterectomy. Evaluate for tumor recurrence. * Tracking Code: BO * EXAM: CT CHEST, ABDOMEN, AND PELVIS WITH CONTRAST TECHNIQUE: Multidetector CT imaging of the chest, abdomen and pelvis was performed following the standard protocol during bolus administration of intravenous contrast. RADIATION DOSE REDUCTION: This exam was performed according to the departmental dose-optimization program which includes automated exposure control, adjustment of the mA and/or kV according to patient size and/or use of iterative reconstruction technique. CONTRAST:  11m  OMNIPAQUE IOHEXOL 300 MG/ML  SOLN COMPARISON:  Chest CT 02/21/2020.  Abdominopelvic CT 11/11/2021. FINDINGS: CT CHEST FINDINGS Cardiovascular: No acute vascular findings. Left subclavian pacemaker leads extend into the right atrium and right ventricle. Mild aortic atherosclerosis. The heart size is normal. There is no pericardial effusion. Mediastinum/Nodes: There are no enlarged mediastinal, hilar or axillary lymph nodes. The thyroid gland, trachea and esophagus demonstrate no significant findings. Lungs/Pleura: Trace bilateral pleural effusions. No pneumothorax. Mild centrilobular emphysema. The aeration of the left lower lobe has improved from the prior examination. There are new non focal patchy ground-glass opacities peripherally in the right upper lobe which are probably inflammatory. Angulated density peripherally in the right middle lobe measuring 1.3 x 0.5 cm on image 81/7 is new, but favored to reflect focal atelectasis or scarring. No suspicious pulmonary nodules. Musculoskeletal/Chest wall: No chest wall mass or suspicious osseous findings. Mild spondylosis. CT ABDOMEN AND PELVIS FINDINGS Hepatobiliary: The liver is normal in density without suspicious focal abnormality. Small calcified gallstones. No gallbladder wall thickening or biliary dilatation. Pancreas: Unremarkable. No pancreatic ductal dilatation or surrounding inflammatory changes. Spleen: Normal in size without focal abnormality. Adrenals/Urinary Tract: Both adrenal glands appear normal. Previously demonstrated tiny calculus in the lower pole of the left kidney is not well seen on this contrast enhanced study. No evidence of ureteral calculus or hydronephrosis. No renal mass. The bladder appears normal for its degree of distention. Stomach/Bowel: Enteric contrast was administered and has passed into the mid colon. The stomach appears unremarkable for its degree of distension. No evidence of bowel wall thickening, distention or surrounding  inflammatory change. The appendix appears normal. Vascular/Lymphatic: There are no enlarged abdominal or pelvic lymph nodes. Mild aortic and branch vessel atherosclerosis without aneurysm or large vessel occlusion. Reproductive: Hysterectomy.  No adnexal mass. Other: No ascites or peritoneal nodularity. Musculoskeletal: No acute or significant osseous findings. Previous L4-5 fusion. Healing fracture of the right inferior pubic ramus. IMPRESSION: 1. No evidence of metastatic disease within the chest, abdomen or pelvis. 2. No acute abdominal findings or change from recent abdominopelvic CT. 3. New patchy non focal ground-glass opacity peripherally in the right upper lobe, likely inflammatory. Consider viral infection. 4. Stable incidental findings, including cholelithiasis, postsurgical changes and Aortic Atherosclerosis (ICD10-I70.0). Electronically Signed   By: WRichardean SaleM.D.   On: 11/13/2021 12:10   CT HEMATURIA WORKUP  Result Date: 11/11/2021 CLINICAL DATA:  Hematuria. EXAM: CT ABDOMEN AND PELVIS WITHOUT AND WITH CONTRAST TECHNIQUE: Multidetector CT imaging of the abdomen and pelvis was performed following the standard protocol before  and following the bolus administration of intravenous contrast. RADIATION DOSE REDUCTION: This exam was performed according to the departmental dose-optimization program which includes automated exposure control, adjustment of the mA and/or kV according to patient size and/or use of iterative reconstruction technique. CONTRAST:  157m OMNIPAQUE IOHEXOL 300 MG/ML  SOLN COMPARISON:  11/07/2019 FINDINGS: Lower Chest: No acute findings. Hepatobiliary: No hepatic masses identified. A few tiny calcified gallstones are noted in the fundus, however there is no evidence of cholecystitis or biliary ductal dilatation. Pancreas:  No mass or inflammatory changes. Spleen: Within normal limits in size and appearance. Adrenals/Urinary Tract: No adrenal masses identified. A 2 mm calculus is  noted in the lower pole of the left kidney. No evidence of ureteral calculi or hydronephrosis. No suspicious renal masses identified. No masses seen involving the collecting systems, ureters, or bladder. Stomach/Bowel: No evidence of obstruction, inflammatory process or abnormal fluid collections. Normal appendix visualized. Vascular/Lymphatic: No pathologically enlarged lymph nodes. No acute vascular findings. Aortic atherosclerotic calcification incidentally noted. Reproductive: Prior hysterectomy noted. Adnexal regions are unremarkable in appearance. Other:  None. Musculoskeletal: No suspicious bone lesions identified. Prior fusion again noted at L4-5. Subacute, healing fracture of the right pubic bone incidentally noted. IMPRESSION: 2 mm left renal calculus. No evidence of ureteral calculi or hydronephrosis. No radiographic evidence of urinary tract neoplasm. Cholelithiasis. No radiographic evidence of cholecystitis. Subacute, healing fracture of right pubic bone. Aortic Atherosclerosis (ICD10-I70.0). Electronically Signed   By: JMarlaine HindM.D.   On: 11/11/2021 17:49   DG Chest 2 View  Result Date: 11/07/2021 CLINICAL DATA:  Tachypnea. EXAM: CHEST - 2 VIEW COMPARISON:  11/05/2021 FINDINGS: Normal sized heart. 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 Clear lungs with normal vascularity. Cervical spine fixation hardware. Thoracic spine degenerative changes. IMPRESSION: No acute abnormality. Electronically Signed   By: SClaudie ReveringM.D.   On: 11/07/2021 17:10    Labs: BNP (last 3 results) No results for input(s): "BNP" in the last 8760 hours. Basic Metabolic Panel: Recent Labs  Lab 11/21/21 1940 11/23/21 0702  NA 142 144  K 4.3 4.0  CL 112* 116*  CO2 24 25  GLUCOSE 117* 94  BUN 9 11  CREATININE 0.93 0.79  CALCIUM 9.0 8.4*   Liver Function Tests: Recent Labs  Lab 11/21/21 1940 11/23/21 0702  AST 11* 9*  ALT 7 6  ALKPHOS 81 67  BILITOT 0.4 0.6  PROT 6.5 5.2*  ALBUMIN 3.7  2.9*   Recent Labs  Lab 11/21/21 1940  LIPASE 29   Recent Labs  Lab 11/23/21 1021  AMMONIA 131*   CBC: Recent Labs  Lab 11/21/21 1940 11/22/21 1406 11/23/21 0702 11/24/21 0619  WBC 10.6*  --  6.3 6.2  NEUTROABS 6.1  --   --   --   HGB 12.6 10.9* 10.6* 10.6*  HCT 40.2 35.1* 34.4* 33.7*  MCV 91.4  --  93.2 92.3  PLT 157  --  114* 104*   Cardiac Enzymes: No results for input(s): "CKTOTAL", "CKMB", "CKMBINDEX", "TROPONINI" in the last 168 hours. BNP: Invalid input(s): "POCBNP" CBG: No results for input(s): "GLUCAP" in the last 168 hours. D-Dimer No results for input(s): "DDIMER" in the last 72 hours. Hgb A1c No results for input(s): "HGBA1C" in the last 72 hours. Lipid Profile No results for input(s): "CHOL", "HDL", "LDLCALC", "TRIG", "CHOLHDL", "LDLDIRECT" in the last 72 hours. Thyroid function studies No results for input(s): "TSH", "T4TOTAL", "T3FREE", "THYROIDAB" in the last 72 hours.  Invalid input(s): "FREET3" Anemia work  up No results for input(s): "VITAMINB12", "FOLATE", "FERRITIN", "TIBC", "IRON", "RETICCTPCT" in the last 72 hours. Urinalysis    Component Value Date/Time   COLORURINE YELLOW 11/22/2021 1054   APPEARANCEUR CLEAR 11/22/2021 1054   LABSPEC 1.012 11/22/2021 1054   PHURINE 6.0 11/22/2021 1054   GLUCOSEU NEGATIVE 11/22/2021 1054   HGBUR SMALL (A) 11/22/2021 1054   BILIRUBINUR NEGATIVE 11/22/2021 1054   KETONESUR NEGATIVE 11/22/2021 1054   PROTEINUR NEGATIVE 11/22/2021 1054   NITRITE NEGATIVE 11/22/2021 1054   LEUKOCYTESUR NEGATIVE 11/22/2021 1054   Sepsis Labs Recent Labs  Lab 11/21/21 1940 11/23/21 0702 11/24/21 0619  WBC 10.6* 6.3 6.2   Microbiology Recent Results (from the past 240 hour(s))  SARS Coronavirus 2 by RT PCR (hospital order, performed in [facility] hospital lab) *cepheid single result test* Anterior Nasal Swab     Status: None   Collection Time: 11/22/21  2:35 AM   Specimen: Anterior Nasal Swab  Result Value Ref  Range Status   SARS Coronavirus 2 by RT PCR NEGATIVE NEGATIVE Final    Comment: (NOTE) SARS-CoV-2 target nucleic acids are NOT DETECTED.  The SARS-CoV-2 RNA is generally detectable in upper and lower respiratory specimens during the acute phase of infection. The lowest concentration of SARS-CoV-2 viral copies this assay can detect is 250 copies / mL. A negative result does not preclude SARS-CoV-2 infection and should not be used as the sole basis for treatment or other patient management decisions.  A negative result may occur with improper specimen collection / handling, submission of specimen other than nasopharyngeal swab, presence of viral mutation(s) within the areas targeted by this assay, and inadequate number of viral copies (<250 copies / mL). A negative result must be combined with clinical observations, patient history, and epidemiological information.  Fact Sheet for Patients:   https://www.patel.info/  Fact Sheet for Healthcare Providers: [email or web address]  This test is not yet approved or  cleared by the [place] FDA and has been authorized for detection and/or diagnosis of SARS-CoV-2 by FDA under an Emergency Use Authorization (EUA).  This EUA will remain in effect (meaning this test can be used) for the duration of the COVID-19 declaration under Section 564(b)(1) of the Act, 21 U.S.C. section 360bbb-3(b)(1), unless the authorization is terminated or revoked sooner.  Performed at [facility], [place] [address], [place], [place] [address]   Urine Culture     Status: None   Collection Time: 11/22/21 10:54 AM   Specimen: Urine, Clean Catch  Result Value Ref Range Status   Specimen Description   Final    URINE, CLEAN CATCH Performed at [facility], [place] [address] [place], [place] [address]    Special Requests   Final    NONE Performed at [facility], [place]  [address], [place], [place] [address]    Culture   Final    NO GROWTH Performed at [facility], [place] [address] [place], [place] [address]    Report Status 11/24/2021 FINAL  Final   Time coordinating discharge: 25 minutes  SIGNED: Antonieta Pert, MD  [facility] 11/26/2021, 10:32 AM  If 7PM-7AM, please contact night-coverage [email or web address]

## 2021-11-26 NOTE — Progress Notes (Signed)
Discharge instructions explained to [name] and her daughter. Both were instructed on how to give a sub-q injection.Written information explaining how to give a sub-q injection was also given to the patient. [name] then showed me how to give herself the injection in her abdomen. She did a good job and stated feeling comfortable doing it. She was reminded to wash her hands first, always rotate her sites, pick her site and cleanse with alcohol. Pinch the skin and give the injection at a 90 degree angle. Next medication doses were written on her discharge papers. She also had 2 prescriptions to pick up at [facility], which is closed today. She does have several lovenox doses already to start with at home. [name] was discharged via wheelchair to her daughter.

## 2021-11-27 ENCOUNTER — Other Ambulatory Visit (HOSPITAL_COMMUNITY): Payer: Self-pay

## 2021-11-29 ENCOUNTER — Other Ambulatory Visit (HOSPITAL_COMMUNITY): Payer: Self-pay

## 2021-11-30 NOTE — Patient Outreach (Signed)
ERLEAN MEALOR February 23, 1956 [id number]  Referral Request: [facility] Readmission Report  [facility] [[facility]] Patient: [employer]  The electronic medical record was reviewed for referral request for readmission review..  Plan: Will send report as requested.  Natividad Brood, RN BSN [place]  [phone] business mobile phone Toll free office [phone]  *[place]  [phone] Fax number: [phone] [email or web address]

## 2021-12-04 ENCOUNTER — Other Ambulatory Visit (HOSPITAL_COMMUNITY): Payer: Self-pay

## 2021-12-04 DIAGNOSIS — I8290 Acute embolism and thrombosis of unspecified vein: Secondary | ICD-10-CM | POA: Diagnosis not present

## 2021-12-04 DIAGNOSIS — Z79899 Other long term (current) drug therapy: Secondary | ICD-10-CM | POA: Diagnosis not present

## 2021-12-05 ENCOUNTER — Other Ambulatory Visit: Payer: Self-pay

## 2021-12-05 ENCOUNTER — Inpatient Hospital Stay: Payer: Medicare Other

## 2021-12-05 ENCOUNTER — Inpatient Hospital Stay: Payer: Medicare Other | Attending: Physician Assistant | Admitting: Physician Assistant

## 2021-12-05 VITALS — BP 103/79 | HR 93 | Temp 97.5°F | Resp 16 | Ht 67.0 in | Wt 102.0 lb

## 2021-12-05 DIAGNOSIS — I82409 Acute embolism and thrombosis of unspecified deep veins of unspecified lower extremity: Secondary | ICD-10-CM | POA: Insufficient documentation

## 2021-12-05 DIAGNOSIS — Z7901 Long term (current) use of anticoagulants: Secondary | ICD-10-CM | POA: Diagnosis not present

## 2021-12-05 DIAGNOSIS — Z86718 Personal history of other venous thrombosis and embolism: Secondary | ICD-10-CM | POA: Diagnosis not present

## 2021-12-05 DIAGNOSIS — D696 Thrombocytopenia, unspecified: Secondary | ICD-10-CM | POA: Diagnosis not present

## 2021-12-05 DIAGNOSIS — Z86711 Personal history of pulmonary embolism: Secondary | ICD-10-CM | POA: Diagnosis not present

## 2021-12-05 DIAGNOSIS — Z950 Presence of cardiac pacemaker: Secondary | ICD-10-CM | POA: Diagnosis not present

## 2021-12-05 DIAGNOSIS — F17210 Nicotine dependence, cigarettes, uncomplicated: Secondary | ICD-10-CM | POA: Diagnosis not present

## 2021-12-05 DIAGNOSIS — D649 Anemia, unspecified: Secondary | ICD-10-CM

## 2021-12-05 DIAGNOSIS — I2693 Single subsegmental pulmonary embolism without acute cor pulmonale: Secondary | ICD-10-CM | POA: Diagnosis not present

## 2021-12-05 LAB — CBC WITH DIFFERENTIAL (CANCER CENTER ONLY)
Abs Immature Granulocytes: 0.02 10*3/uL (ref 0.00–0.07)
Basophils Absolute: 0.1 10*3/uL (ref 0.0–0.1)
Basophils Relative: 1 %
Eosinophils Absolute: 0.2 10*3/uL (ref 0.0–0.5)
Eosinophils Relative: 2 %
HCT: 39.8 % (ref 36.0–46.0)
Hemoglobin: 12.8 g/dL (ref 12.0–15.0)
Immature Granulocytes: 0 %
Lymphocytes Relative: 39 %
Lymphs Abs: 3.7 10*3/uL (ref 0.7–4.0)
MCH: 29.7 pg (ref 26.0–34.0)
MCHC: 32.2 g/dL (ref 30.0–36.0)
MCV: 92.3 fL (ref 80.0–100.0)
Monocytes Absolute: 0.7 10*3/uL (ref 0.1–1.0)
Monocytes Relative: 7 %
Neutro Abs: 4.9 10*3/uL (ref 1.7–7.7)
Neutrophils Relative %: 51 %
Platelet Count: 209 10*3/uL (ref 150–400)
RBC: 4.31 MIL/uL (ref 3.87–5.11)
RDW: 17.5 % — ABNORMAL HIGH (ref 11.5–15.5)
WBC Count: 9.5 10*3/uL (ref 4.0–10.5)
nRBC: 0 % (ref 0.0–0.2)

## 2021-12-05 LAB — CMP (CANCER CENTER ONLY)
ALT: 29 U/L (ref 0–44)
AST: 19 U/L (ref 15–41)
Albumin: 4.2 g/dL (ref 3.5–5.0)
Alkaline Phosphatase: 70 U/L (ref 38–126)
Anion gap: 5 (ref 5–15)
BUN: 12 mg/dL (ref 8–23)
CO2: 28 mmol/L (ref 22–32)
Calcium: 9.2 mg/dL (ref 8.9–10.3)
Chloride: 109 mmol/L (ref 98–111)
Creatinine: 0.83 mg/dL (ref 0.44–1.00)
GFR, Estimated: >60 mL/min (ref >60)
Glucose, Bld: 90 mg/dL (ref 70–99)
Potassium: 4.2 mmol/L (ref 3.5–5.1)
Sodium: 142 mmol/L (ref 135–145)
Total Bilirubin: 0.6 mg/dL (ref 0.3–1.2)
Total Protein: 6.5 g/dL (ref 6.5–8.1)

## 2021-12-05 LAB — FOLATE: Folate: 7.4 ng/mL (ref >5.9)

## 2021-12-05 LAB — IRON AND IRON BINDING CAPACITY (CC-WL,HP ONLY)
Iron: 63 ug/dL (ref 28–170)
Saturation Ratios: 15 % (ref 10.4–31.8)
TIBC: 423 ug/dL (ref 250–450)
UIBC: 360 ug/dL (ref 148–442)

## 2021-12-05 LAB — VITAMIN B12: Vitamin B-12: 785 pg/mL (ref 180–914)

## 2021-12-05 NOTE — Progress Notes (Signed)
[place] [phone]   Fax:([phone] [place] NOTE  Patient Care Team: Lujean Amel, MD as PCP - General (Family Medicine) Alen Blew as Physician Assistant (Psychiatry)  Hematological/Oncological History 1) 10/23/2021-11/11/2021: Admitted In [place] staying 45 seconds and witnessed by her daughter.  CT imaging revealed an acute right lobar, segmental and subsegmental pulmonary emboli.  Lower extremity Doppler was positive for DVT.  She was started on IV heparin.  Hypercoagulable testing was performed.  Findings revealed beta-2 glycoprotein IgM elevated greater than 150 and anticardiolipin antibodies IgM elevated above 103.5.Patient was discharged on Lovenox bridging to Coumadin due to concern for possible antiphospholipid syndrome.  2. 11/18/2021-11/26/2021: Readmitted at [facility] due to altered mental status and was found to have a UTI and therapeutic INR. Her INR was found to be 3.6 and subsequently 4.2 on 11/22/2021.  Patient was switched to Lovenox 1 mg/kg twice daily due to difficulty controlling her INRs.  3. 12/05/2021: Established care with [facility]  CHIEF COMPLAINTS/PURPOSE OF CONSULTATION:  "DVT and PE concerning for antiphospholipid antibody syndrome"  HISTORY OF PRESENTING ILLNESS:  [name] 66 y.o. female returns for an outpatient follow-up after consultation with Dr. Lorenso Courier during recent hospitalization on 11/24/2021.  She is unaccompanied for this visit.  Ms. Truby reports that she is tolerating Lovenox injections and alternating sides as instructed to minimize bruising.  She denies any signs of active bleeding.  She reports her energy levels are stable and she is able to complete her daily activities on her own.  She denies any nausea, vomiting or abdominal pain.  Her bowel habits are unchanged without any recurrent episodes of diarrhea or constipation.  She denies any fevers, chills, night sweats, shortness of  breath, chest pain, cough, or peripheral edema. She has no other complaints. Rest of the 10 point ROS is below.   MEDICAL HISTORY:  Past Medical History:  Diagnosis Date   Anxiety    anxiety and mild depressive order systoms   Bipolar disorder ([place])     (02/20/2013)   Cervical cancer ([place]) 03/19/1980   Cholelithiasis 11/11/2021   Depression    Dysrhythmia    Headache(784.0)    "monthly" (02/20/2013)   Hyperlipidemia 11/11/2021   Pacemaker    medtronic   PONV (postoperative nausea and vomiting)    Sinus pause 02/14/2013   Syncope and collapse    echo-April 12,2012-EF 55% Echo normal; recorder with prolonged sinus pauses --> status post   Tobacco abuse    Vertigo     SURGICAL HISTORY: Past Surgical History:  Procedure Laterality Date   ABDOMINAL HYSTERECTOMY  03/19/1980   "partial"   BACK SURGERY     CERVICAL FUSION Left 11/2013   C4-C6   CERVICAL FUSION     3,4,5,   INSERT / REPLACE / REMOVE PACEMAKER  02/20/2013   medtronic   LOOP RECORDER IMPLANT N/A 11/06/2012   Procedure: LINQ LOOP RECORDER IMPLANT;  Surgeon: Sanda Klein, MD;  Location: [place] CATH LAB;  Service: Cardiovascular;  Laterality: N/A;   NECK SURGERY  11/17/2013   PACEMAKER PLACEMENT N/A 02/16/2013   PERMANENT PACEMAKER INSERTION N/A 02/20/2013   Procedure: PERMANENT PACEMAKER INSERTION;  Surgeon: Sanda Klein, MD;  Location: [place] CATH LAB;  Service: Cardiovascular;  Laterality: N/A;   POSTERIOR LUMBAR FUSION  ~ 2009   ROBOTIC ASSISTED SALPINGO OOPHERECTOMY Bilateral 11/25/2018   Procedure: XI ROBOTIC ASSISTED BILATERAL SALPINGO OOPHORECTOMY and LYSIS OF ADHESIONS.;  Surgeon: Everitt Amber, MD;  Location:  [facility];  Service: Gynecology;  Laterality: Bilateral;   SPINAL FUSION  03/19/2014   TONSILLECTOMY     TRANSTHORACIC ECHOCARDIOGRAM  07/11/2010   The left atrial size is normal.There is no evidence of mitral vavle prolaspe.Right ventriicular systolic pressure is normal . Injection of contrast documented no interatrial  shunt. Essentially normal 2D echo -doppler study     SOCIAL HISTORY: Social History   Socioeconomic History   Marital status: Widowed    Spouse name: Not on file   Number of children: 3   Years of education: 41   Highest education level: Not on file  Occupational History   Not on file  Tobacco Use   Smoking status: Every Day    Packs/day: 0.75    Years: 40.00    Total pack years: 30.00    Types: Cigarettes   Smokeless tobacco: Never  Vaping Use   Vaping Use: Never used  Substance and Sexual Activity   Alcohol use: No   Drug use: No   Sexual activity: Yes  Other Topics Concern   Not on file  Social History Narrative   Married 29 years . Has 3 children. Current smoker -1 pack a day-for 36 years .Alcohol :  1 beer on occasion.   She is soon to be a grandmother.  His before to going on a trip up to the [place] area to be closer to family when the baby is born.  She is extremely excited.   She very much would like to move back up to that area to be closer to family, but the whether is it just too cold in the winter.   Right handed    Soda daily   Lives alone   Social Determinants of Health   Financial Resource Strain: Low Risk  (10/11/2020)   Overall Financial Resource Strain (CARDIA)    Difficulty of Paying Living Expenses: Not hard at all  Food Insecurity: No Food Insecurity (11/22/2021)   Hunger Vital Sign    Worried About Running Out of Food in the Last Year: Never true    Ran Out of Food in the Last Year: Never true  Transportation Needs: No Transportation Needs (11/22/2021)   PRAPARE - Hydrologist (Medical): No    Lack of Transportation (Non-Medical): No  Physical Activity: Not on file  Stress: No Stress Concern Present (10/11/2020)   [place]    Feeling of Stress : Only a little  Social Connections: Not on file  Intimate Partner Violence: Not At Risk  (11/22/2021)   Humiliation, Afraid, Rape, and Kick questionnaire    Fear of Current or Ex-Partner: No    Emotionally Abused: No    Physically Abused: No    Sexually Abused: No    FAMILY HISTORY: Family History  Adopted: Yes  Problem Relation Age of Onset   Heart attack Brother     ALLERGIES:  is allergic to antibiotic ear [neomycin-polymyxin-hc] and penicillins.  MEDICATIONS:  Current Outpatient Medications  Medication Sig Dispense Refill   ALPRAZolam (XANAX) 1 MG tablet Take 1 tablet (1 mg total) by mouth 3 (three) times daily as needed for anxiety. Must last at least 30 days. (Patient taking differently: Take 0.5 mg by mouth daily as needed for anxiety. Must last at least 30 days.) 90 tablet 0   amitriptyline (ELAVIL) 25 MG tablet TAKE 2 TABLETS BY MOUTH AT BEDTIME. 180 tablet 0   divalproex (  DEPAKOTE) 250 MG DR tablet Take 250 mg by mouth in the morning, at noon, and at bedtime.     enoxaparin (LOVENOX) 60 MG/0.6ML injection Inject 0.5 mLs (50 mg total) into the skin 2 (two) times daily. 30 mL 0   hydrOXYzine (ATARAX) 50 MG tablet TAKE 1-2 TABLETS (50-100 MG TOTAL) BY MOUTH AT BEDTIME AS NEEDED. 180 tablet 0   lactulose (CHRONULAC) 10 GM/15ML solution Take 15 mLs (10 g total) by mouth 2 (two) times daily as needed for mild constipation. Titrate for 2-3 BM daily. Hold if loose stool. 236 mL 0   Lemborexant (DAYVIGO) 10 MG TABS Take 10 mg by mouth at bedtime.     risperiDONE (RISPERDAL) 3 MG tablet TAKE 1 TABLET BY MOUTH AT BEDTIME. 90 tablet 0   tiZANidine (ZANAFLEX) 4 MG tablet Take 4 mg by mouth every 8 (eight) hours as needed for muscle spasms.     ondansetron (ZOFRAN) 8 MG tablet Take 8 mg by mouth 2 (two) times daily as needed for nausea/vomiting. (Patient not taking: Reported on 12/05/2021)     Oxycodone HCl 10 MG TABS Take 10 mg by mouth daily as needed for pain. (Patient not taking: Reported on 12/05/2021)     No current facility-administered medications for this visit.     REVIEW OF SYSTEMS:   Constitutional: ( - ) fevers, ( - )  chills , ( - ) night sweats Eyes: ( - ) blurriness of vision, ( - ) double vision, ( - ) watery eyes Ears, nose, mouth, throat, and face: ( - ) mucositis, ( - ) sore throat Respiratory: ( - ) cough, ( - ) dyspnea, ( - ) wheezes Cardiovascular: ( - ) palpitation, ( - ) chest discomfort, ( - ) lower extremity swelling Gastrointestinal:  ( - ) nausea, ( - ) heartburn, ( - ) change in bowel habits Skin: ( - ) abnormal skin rashes Lymphatics: ( - ) new lymphadenopathy, ( - ) easy bruising Neurological: ( - ) numbness, ( - ) tingling, ( - ) new weaknesses Behavioral/Psych: ( - ) mood change, ( - ) new changes  All other systems were reviewed with the patient and are negative.  PHYSICAL EXAMINATION: ECOG PERFORMANCE STATUS: 1 - Symptomatic but completely ambulatory  Vitals:   12/05/21 1402  BP: 103/79  Pulse: 93  Resp: 16  Temp: (!) 97.5 F (36.4 C)  SpO2: 99%   Filed Weights   12/05/21 1402  Weight: 102 lb (46.3 kg)    GENERAL:thin appearing female in NAD  SKIN: skin color, texture, turgor are normal, no rashes or significant lesions EYES: conjunctiva are pink and non-injected, sclera clear OROPHARYNX: no exudate, no erythema; lips, buccal mucosa, and tongue normal  NECK: supple, non-tender LYMPH:  no palpable lymphadenopathy in the cervical or supraclavicular lymph nodes.  LUNGS: clear to auscultation and percussion with normal breathing effort HEART: regular rate & rhythm and no murmurs and no lower extremity edema Musculoskeletal: no cyanosis of digits and no clubbing  PSYCH: alert & oriented x 3, fluent speech NEURO: no focal motor/sensory deficits  LABORATORY DATA:  I have reviewed the data as listed    Latest Ref Rng & Units 11/24/2021    6:19 AM 11/23/2021    7:02 AM 11/22/2021    2:06 PM  CBC  WBC 4.0 - 10.5 K/uL 6.2  6.3    Hemoglobin 12.0 - 15.0 g/dL 10.6  10.6  10.9   Hematocrit 36.0 - 46.0 % 33.7  34.4   35.1   Platelets 150 - 400 K/uL 104  114         Latest Ref Rng & Units 11/23/2021    7:02 AM 11/21/2021    7:40 PM 11/13/2021    4:26 AM  CMP  Glucose 70 - 99 mg/dL 94  117  101   BUN 8 - 23 mg/dL '11  9  11   ' Creatinine 0.44 - 1.00 mg/dL 0.79  0.93  0.77   Sodium 135 - 145 mmol/L 144  142  141   Potassium 3.5 - 5.1 mmol/L 4.0  4.3  4.3   Chloride 98 - 111 mmol/L 116  112  110   CO2 22 - 32 mmol/L '25  24  24   ' Calcium 8.9 - 10.3 mg/dL 8.4  9.0  8.6   Total Protein 6.5 - 8.1 g/dL 5.2  6.5    Total Bilirubin 0.3 - 1.2 mg/dL 0.6  0.4    Alkaline Phos 38 - 126 U/L 67  81    AST 15 - 41 U/L 9  11    ALT 0 - 44 U/L 6  7      RADIOGRAPHIC STUDIES: I have personally reviewed the radiological images as listed and agreed with the findings in the report. DG Chest Portable 1 View  Result Date: 11/22/2021 CLINICAL DATA:  Altered mental status EXAM: PORTABLE CHEST 1 VIEW COMPARISON:  None Available. FINDINGS: Lungs are clear. No pneumothorax or pleural effusion. Cardiac size within normal limits on this semi erect examination. Left subclavian dual lead pacemaker is unchanged. Pulmonary vascularity is normal. No acute bone abnormality. IMPRESSION: No active disease. Electronically Signed   By: Fidela Salisbury M.D.   On: 11/22/2021 02:59   CT Head Wo Contrast  Result Date: 11/21/2021 CLINICAL DATA:  Mental status change, unknown cause EXAM: CT HEAD WITHOUT CONTRAST TECHNIQUE: Contiguous axial images were obtained from the base of the skull through the vertex without intravenous contrast. RADIATION DOSE REDUCTION: This exam was performed according to the departmental dose-optimization program which includes automated exposure control, adjustment of the mA and/or kV according to patient size and/or use of iterative reconstruction technique. COMPARISON:  Head CT 05/03/2021 FINDINGS: Brain: No intracranial hemorrhage, mass effect, or midline shift. Stable degree of atrophy. No hydrocephalus. The basilar  cisterns are patent. No evidence of territorial infarct or acute ischemia. No extra-axial or intracranial fluid collection. Vascular: Atherosclerosis of skullbase vasculature without hyperdense vessel or abnormal calcification. Skull: No fracture or focal lesion. Sinuses/Orbits: Paranasal sinuses and mastoid air cells are clear. The visualized orbits are unremarkable. Other: None. IMPRESSION: No acute intracranial abnormality. Electronically Signed   By: Keith Rake M.D.   On: 11/21/2021 20:45   CT CHEST ABDOMEN PELVIS W CONTRAST  Result Date: 11/13/2021 CLINICAL DATA:  History of uterine/cervical cancer status post total hysterectomy. Evaluate for tumor recurrence. * Tracking Code: BO * EXAM: CT CHEST, ABDOMEN, AND PELVIS WITH CONTRAST TECHNIQUE: Multidetector CT imaging of the chest, abdomen and pelvis was performed following the standard protocol during bolus administration of intravenous contrast. RADIATION DOSE REDUCTION: This exam was performed according to the departmental dose-optimization program which includes automated exposure control, adjustment of the mA and/or kV according to patient size and/or use of iterative reconstruction technique. CONTRAST:  167m OMNIPAQUE IOHEXOL 300 MG/ML  SOLN COMPARISON:  Chest CT 02/21/2020.  Abdominopelvic CT 11/11/2021. FINDINGS: CT CHEST FINDINGS Cardiovascular: No acute vascular findings. Left subclavian pacemaker leads extend into the right atrium and  right ventricle. Mild aortic atherosclerosis. The heart size is normal. There is no pericardial effusion. Mediastinum/Nodes: There are no enlarged mediastinal, hilar or axillary lymph nodes. The thyroid gland, trachea and esophagus demonstrate no significant findings. Lungs/Pleura: Trace bilateral pleural effusions. No pneumothorax. Mild centrilobular emphysema. The aeration of the left lower lobe has improved from the prior examination. There are new non focal patchy ground-glass opacities peripherally in the  right upper lobe which are probably inflammatory. Angulated density peripherally in the right middle lobe measuring 1.3 x 0.5 cm on image 81/7 is new, but favored to reflect focal atelectasis or scarring. No suspicious pulmonary nodules. Musculoskeletal/Chest wall: No chest wall mass or suspicious osseous findings. Mild spondylosis. CT ABDOMEN AND PELVIS FINDINGS Hepatobiliary: The liver is normal in density without suspicious focal abnormality. Small calcified gallstones. No gallbladder wall thickening or biliary dilatation. Pancreas: Unremarkable. No pancreatic ductal dilatation or surrounding inflammatory changes. Spleen: Normal in size without focal abnormality. Adrenals/Urinary Tract: Both adrenal glands appear normal. Previously demonstrated tiny calculus in the lower pole of the left kidney is not well seen on this contrast enhanced study. No evidence of ureteral calculus or hydronephrosis. No renal mass. The bladder appears normal for its degree of distention. Stomach/Bowel: Enteric contrast was administered and has passed into the mid colon. The stomach appears unremarkable for its degree of distension. No evidence of bowel wall thickening, distention or surrounding inflammatory change. The appendix appears normal. Vascular/Lymphatic: There are no enlarged abdominal or pelvic lymph nodes. Mild aortic and branch vessel atherosclerosis without aneurysm or large vessel occlusion. Reproductive: Hysterectomy.  No adnexal mass. Other: No ascites or peritoneal nodularity. Musculoskeletal: No acute or significant osseous findings. Previous L4-5 fusion. Healing fracture of the right inferior pubic ramus. IMPRESSION: 1. No evidence of metastatic disease within the chest, abdomen or pelvis. 2. No acute abdominal findings or change from recent abdominopelvic CT. 3. New patchy non focal ground-glass opacity peripherally in the right upper lobe, likely inflammatory. Consider viral infection. 4. Stable incidental  findings, including cholelithiasis, postsurgical changes and Aortic Atherosclerosis (ICD10-I70.0). Electronically Signed   By: Richardean Sale M.D.   On: 11/13/2021 12:10   CT HEMATURIA WORKUP  Result Date: 11/11/2021 CLINICAL DATA:  Hematuria. EXAM: CT ABDOMEN AND PELVIS WITHOUT AND WITH CONTRAST TECHNIQUE: Multidetector CT imaging of the abdomen and pelvis was performed following the standard protocol before and following the bolus administration of intravenous contrast. RADIATION DOSE REDUCTION: This exam was performed according to the departmental dose-optimization program which includes automated exposure control, adjustment of the mA and/or kV according to patient size and/or use of iterative reconstruction technique. CONTRAST:  114m OMNIPAQUE IOHEXOL 300 MG/ML  SOLN COMPARISON:  11/07/2019 FINDINGS: Lower Chest: No acute findings. Hepatobiliary: No hepatic masses identified. A few tiny calcified gallstones are noted in the fundus, however there is no evidence of cholecystitis or biliary ductal dilatation. Pancreas:  No mass or inflammatory changes. Spleen: Within normal limits in size and appearance. Adrenals/Urinary Tract: No adrenal masses identified. A 2 mm calculus is noted in the lower pole of the left kidney. No evidence of ureteral calculi or hydronephrosis. No suspicious renal masses identified. No masses seen involving the collecting systems, ureters, or bladder. Stomach/Bowel: No evidence of obstruction, inflammatory process or abnormal fluid collections. Normal appendix visualized. Vascular/Lymphatic: No pathologically enlarged lymph nodes. No acute vascular findings. Aortic atherosclerotic calcification incidentally noted. Reproductive: Prior hysterectomy noted. Adnexal regions are unremarkable in appearance. Other:  None. Musculoskeletal: No suspicious bone lesions identified. Prior fusion again noted  at L4-5. Subacute, healing fracture of the right pubic bone incidentally noted. IMPRESSION:  2 mm left renal calculus. No evidence of ureteral calculi or hydronephrosis. No radiographic evidence of urinary tract neoplasm. Cholelithiasis. No radiographic evidence of cholecystitis. Subacute, healing fracture of right pubic bone. Aortic Atherosclerosis (ICD10-I70.0). Electronically Signed   By: Marlaine Hind M.D.   On: 11/11/2021 17:49   DG Chest 2 View  Result Date: 11/07/2021 CLINICAL DATA:  Tachypnea. EXAM: CHEST - 2 VIEW COMPARISON:  11/05/2021 FINDINGS: Normal sized heart. 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 Clear lungs with normal vascularity. Cervical spine fixation hardware. Thoracic spine degenerative changes. IMPRESSION: No acute abnormality. Electronically Signed   By: Claudie Revering M.D.   On: 11/07/2021 17:10    ASSESSMENT & PLAN [name]  is a 66 y.o. female who presents for follow-up for recent DVT and PE, concerning for antiphospholipid syndrome.   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 --Tolerating Lovenox without any prohibitive toxicities including bleeding.  --DOAC therapy is not appropriate in the setting of concern for antiphospholipid antibody syndrome --Hypercoagulable testing from 10/27/2021 showed beta-2 glycoprotein IgM elevated greater than 150 and anticardiolipin antibodies IgM elevated above 103.5. -- In order to confirm antiphospholipid antibody syndrome patient will require repeat testing in 12 weeks time from her initial studies on 10/27/2021.  --RTC in 8 weeks with labs and follow up visit.   #Normocytic anemia/thrombocytopenia: --Most recent labs from 11/24/2021 showed Hgb 10.6, MCV 92.3, Plt 104K --Will check iron, B12 and folate levels today   Orders Placed This Encounter  Procedures   CBC with Differential ([facility] Only)    Standing Status:   Future    Standing Expiration Date:   12/06/2022   CMP ([place]  only)    Standing Status:   Future    Standing Expiration Date:   12/06/2022   Ferritin    Standing Status:   Future    Standing Expiration Date:   12/06/2022   Iron and Iron Binding Capacity ([facility] only)    Standing Status:   Future    Standing Expiration Date:   12/06/2022   Vitamin B12    Standing Status:   Future    Standing Expiration Date:   12/05/2022   Methylmalonic acid, serum    Standing Status:   Future    Standing Expiration Date:   12/05/2022   Folate, Serum    Standing Status:   Future    Standing Expiration Date:   12/05/2022    All questions were answered. The patient knows to call the clinic with any problems, questions or concerns.  I have spent a total of 45 minutes minutes of face-to-face and non-face-to-face time, preparing to see the patient, performing a medically appropriate examination, counseling and educating the patient, ordering tests/procedures,  documenting clinical information in the electronic health record,  and care coordination.   Dede Query, PA-C Department of Hematology/Oncology [place] at [facility] Phone: [phone]  Patient was seen with Dr. Lorenso Courier  I have read the above note and personally examined the patient. I agree with the assessment and plan as noted above.  Briefly Mrs. Woolen is a 66 year old patient I met while she was admitted on the inpatient side.  She was having difficulty controlling her INRs and therefore on Lovenox therapy to help better keep adequate levels of anticoagulation.  At this time strong suspicion for antiphospholipid antibody syndrome, but patient will require repeat testing 12 weeks from her initial positive testing in order to confirm.  We will plan to have the patient back at the 12-week mark in the interim continue on the Lovenox therapy.  DOAC therapy is inadequate for patients with antiphospholipid antibody syndrome.  The patient voiced understanding of the plan moving  forward.   Ledell Peoples, MD Department of Hematology/Oncology [place] at [facility] Phone: [phone] Pager: [phone] Email: [email or web address]

## 2021-12-06 ENCOUNTER — Telehealth: Payer: Self-pay | Admitting: Physician Assistant

## 2021-12-06 LAB — FERRITIN: Ferritin: 43 ng/mL (ref 11–307)

## 2021-12-06 NOTE — Telephone Encounter (Signed)
Per 9/19 los called and spoke to pt about appointments

## 2021-12-08 LAB — METHYLMALONIC ACID, SERUM: Methylmalonic Acid, Quantitative: 106 nmol/L (ref 0–378)

## 2021-12-11 ENCOUNTER — Telehealth: Payer: Self-pay

## 2021-12-11 NOTE — Telephone Encounter (Signed)
-----   Message from Lincoln Brigham, PA-C sent at 12/11/2021 11:52 AM EDT ----- Please notify patient that hemoglobin and platelet counts have resolved.

## 2021-12-11 NOTE — Telephone Encounter (Signed)
Pt advised of lab results and to continue lovenox as directed

## 2021-12-19 ENCOUNTER — Other Ambulatory Visit (HOSPITAL_COMMUNITY): Payer: Self-pay

## 2021-12-20 ENCOUNTER — Telehealth: Payer: Self-pay

## 2021-12-20 ENCOUNTER — Other Ambulatory Visit: Payer: Self-pay | Admitting: Physician Assistant

## 2021-12-20 MED ORDER — ENOXAPARIN SODIUM 60 MG/0.6ML IJ SOSY: 50.0000 mg | PREFILLED_SYRINGE | Freq: Two times a day (BID) | INTRAMUSCULAR | 3 refills | Status: DC

## 2021-12-20 NOTE — Telephone Encounter (Signed)
T/C from pt requesting a refill for her Lovenox.  Rx sent to [facility] and pt advised

## 2021-12-21 ENCOUNTER — Ambulatory Visit (INDEPENDENT_AMBULATORY_CARE_PROVIDER_SITE_OTHER): Payer: Medicare Other | Admitting: Physician Assistant

## 2021-12-21 ENCOUNTER — Encounter: Payer: Self-pay | Admitting: Physician Assistant

## 2021-12-21 DIAGNOSIS — G4700 Insomnia, unspecified: Secondary | ICD-10-CM | POA: Diagnosis not present

## 2021-12-21 DIAGNOSIS — G4709 Other insomnia: Secondary | ICD-10-CM

## 2021-12-21 DIAGNOSIS — Z79899 Other long term (current) drug therapy: Secondary | ICD-10-CM

## 2021-12-21 DIAGNOSIS — F319 Bipolar disorder, unspecified: Secondary | ICD-10-CM | POA: Diagnosis not present

## 2021-12-21 DIAGNOSIS — F411 Generalized anxiety disorder: Secondary | ICD-10-CM | POA: Diagnosis not present

## 2021-12-21 MED ORDER — DIVALPROEX SODIUM 250 MG PO DR TAB: 250.0000 mg | DELAYED_RELEASE_TABLET | Freq: Three times a day (TID) | ORAL | 0 refills | Status: DC

## 2021-12-21 MED ORDER — HYDROXYZINE HCL 25 MG PO TABS: 12.5000 mg | ORAL_TABLET | Freq: Three times a day (TID) | ORAL | 0 refills | Status: DC | PRN

## 2021-12-21 MED ORDER — ALPRAZOLAM 0.5 MG PO TABS: 0.5000 mg | ORAL_TABLET | Freq: Two times a day (BID) | ORAL | 0 refills | Status: DC | PRN

## 2021-12-21 MED ORDER — RISPERIDONE 3 MG PO TABS: 3.0000 mg | ORAL_TABLET | Freq: Every day | ORAL | 0 refills | Status: DC

## 2021-12-21 NOTE — Progress Notes (Signed)
[facility]  Patient ID: [name],  MRN: [id number]  PCP: Lujean Amel, MD  Date of Evaluation: 12/21/2021 Time spent:30 minutes  Chief Complaint:  Chief Complaint   Anxiety; Depression; Follow-up    HISTORY/CURRENT STATUS: HPI for routine med check.  She requests a new prescription for Xanax.  It was filled on 12/03/2021.  States she was at her daughter's house in [place], she left it there by accident and her daughter threw the bottle away, thinking it was an old one of hers.  Patient returned home on 12/15/2021 and has not had any Xanax since being home (6 days now.)  Had been on Xanax 1 mg 3 times daily for a long time.  Denies withdrawals, specifically no tremors of her hands, shaking, sweating, confusion, nausea or vomiting, palpitations, headache, or increased forgetfulness.  Denies seizures.  She has had insomnia but that is no worse since not taking the Xanax.  Has had a few panic attacks which lasted a few minutes and then go away.  Patient is able to enjoy things.  Energy and motivation are good.  No extreme sadness, tearfulness, or feelings of hopelessness.  Still has chronic insomnia, says she only sleeps for a few hours at a time.  She has refused to go to neurology or a sleep specialist for evaluation.  "I don't know why you won't give me something to make me sleep! I don't need to see anybody else." ADLs and personal hygiene are normal.   Denies any changes in concentration, making decisions, or remembering things.  Appetite has not changed.  She is very thin but says she is eating.  Not isolating.  Not crying easily.  Denies suicidal or homicidal thoughts.  Patient denies increased energy with decreased need for sleep, increased talkativeness, racing thoughts, impulsivity or risky behaviors, increased spending, increased libido, grandiosity, increased irritability or anger, paranoia, or hallucinations.  Review of Systems  Constitutional: Negative.    HENT: Negative.    Eyes: Negative.   Respiratory: Negative.    Cardiovascular: Negative.   Gastrointestinal: Negative.   Genitourinary: Negative.   Musculoskeletal:  Positive for back pain and neck pain.       Chronic low back and neck pain.  Skin: Negative.   Neurological: Negative.   Endo/Heme/Allergies: Negative.   Psychiatric/Behavioral:         See HPI   Individual Medical History/ Review of Systems: Changes? :Yes   was at her daughter's house in [place] in August when she was hospitalized for a "blood clot in her leg, chest, and head."  Was started on Depakote which she still takes, as well as Lovenox.  When asked specifically about a stroke, states she had a blood clot in her head but did not have a stroke.  She has no focal neurologic deficits.  11/21/2021 hospitalized with encephalopathy.   Past medications for mental health diagnoses include: Trazodone, Belsomra, Risperdal, Zoloft, hydroxyzine, mirtazapine, Xanax, Valium, Wellbutrin, Abilify, gabapentin, Strattera, Vyvanse, Ambien, Lunesta, Dayvigo didn't help, Latuda, Quviviq, Sonata  Allergies: Antibiotic ear [neomycin-polymyxin-hc] and Penicillins  Current Medications:  Current Outpatient Medications:    ALPRAZolam (XANAX) 0.5 MG tablet, Take 1 tablet (0.5 mg total) by mouth 2 (two) times daily as needed for anxiety., Disp: 10 tablet, Rfl: 0   amitriptyline (ELAVIL) 25 MG tablet, TAKE 2 TABLETS BY MOUTH AT BEDTIME., Disp: 180 tablet, Rfl: 0   enoxaparin (LOVENOX) 60 MG/0.6ML injection, Inject 0.5 mLs (50 mg total) into the skin 2 (  two) times daily., Disp: 30 mL, Rfl: 3   hydrOXYzine (ATARAX) 25 MG tablet, Take 0.5-1 tablets (12.5-25 mg total) by mouth 3 (three) times daily as needed., Disp: 60 tablet, Rfl: 0   lactulose (CHRONULAC) 10 GM/15ML solution, Take 15 mLs (10 g total) by mouth 2 (two) times daily as needed for mild constipation. Titrate for 2-3 BM daily. Hold if loose stool., Disp: 236 mL, Rfl: 0   tiZANidine  (ZANAFLEX) 4 MG tablet, Take 4 mg by mouth every 8 (eight) hours as needed for muscle spasms., Disp: , Rfl:    divalproex (DEPAKOTE) 250 MG DR tablet, Take 1 tablet (250 mg total) by mouth in the morning, at noon, and at bedtime., Disp: 90 tablet, Rfl: 0   risperiDONE (RISPERDAL) 3 MG tablet, Take 1 tablet (3 mg total) by mouth at bedtime., Disp: 90 tablet, Rfl: 0 Medication Side Effects: none  Family Medical/ Social History: Changes? No  MENTAL HEALTH EXAM:  There were no vitals taken for this visit.There is no height or weight on file to calculate BMI.  General Appearance: Casual, Guarded, Well Groomed, and very thin  Eye Contact:  Good  Speech:  Clear and Coherent and Normal Rate  Volume:  Decreased  Mood:  Angry and Irritable  Affect:  Congruent and Labile  Thought Process:  Goal Directed and Descriptions of Associations: Circumstantial  Orientation:  Full (Time, Place, and Person)  Thought Content: Logical   Suicidal Thoughts:  No  Homicidal Thoughts:  No  Memory:   Fair  Judgement:  Poor  Insight:  Lacking  Psychomotor Activity:  Normal and without gross focal neurologic deficits. Gait is normal  Concentration:  Concentration: Good  Recall:  [employer] of Knowledge: Fair  Language: Fair  Assets:  Neurosurgeon  ADL's:  Intact  Cognition: WNL  Prognosis:  Fair   DIAGNOSES:    ICD-10-CM   1. Bipolar I disorder (HCC)  F31.9 Valproic acid level    Comprehensive metabolic panel    CBC with Differential/Platelet    2. Generalized anxiety disorder  F41.1 risperiDONE (RISPERDAL) 3 MG tablet    3. Insomnia, unspecified type  G47.00 risperiDONE (RISPERDAL) 3 MG tablet    4. Other insomnia  G47.09 risperiDONE (RISPERDAL) 3 MG tablet    5. Encounter for long-term (current) use of medications  Z79.899 Valproic acid level    Comprehensive metabolic panel    CBC with Differential/Platelet     Receiving Psychotherapy: No    RECOMMENDATIONS:  PDMP was reviewed. Last Xanax filled 12/03/2021.  Oxycodone filled 10/23/2021. I provided 40  minutes of face to face time during this encounter, including time spent before and after the visit in records review, medical decision making, counseling pertinent to today's visit, and charting.   Smoking cessation discussed.  Not ready to quit.  Due to the fact that patient has been off the Xanax for a week, but has had no withdrawals despite having taken Xanax 3 mg a day for years, I will not restart high-dose Xanax.  Physiologically, this isn't possible. I'm concerned because of  questionable diversion of  Xanax.  Benzos are not recommended in the elderly anyway due to risk of confusion, falls and sequela related to that. She had been on BZ when I started seeing her and I agreed to continue b/c felt the benefit outweighed the risk at the time.   In case she does need to be weaned, I will give a few Xanax to do  so but will prescribe no more. She's very upset, told me she's not abusing or selling them, (which she brought up,) and I'm being unreasonable for not giving her what she needs.    She's also upset that I won't give her something else for sleep and am 'making' her go to a sleep specialist which she 'doesn't need to go to.' Numerous past visits and phone messages are about sleep. I see that she asked for Ambien on d/c of hosp 11/21/2021 admission which was denied.   Refer back to Dr. Felecia Shelling, neurology, who she saw in the hospital a year or so ago, for sleep disturbance and recent seizure and thrombus.   Wean off xanax 0.5 mg, 1 po bid prn.  Continue Elavil 25 mg, 2 po qhs. Continue Depakote DR 250 mg, 1 po tid. Start hydroxyzine 25 mg, 1/2-1 p.o. 3 times daily as needed anxiety. Continue Risperdal 3 mg, 1 p.o. nightly. Labs ordered as noted above. Recommend counseling. Return in 6 weeks.  Donnal Moat, PA-C

## 2021-12-23 ENCOUNTER — Encounter: Payer: Self-pay | Admitting: Physician Assistant

## 2021-12-27 ENCOUNTER — Telehealth: Payer: Self-pay | Admitting: Physician Assistant

## 2021-12-27 NOTE — Telephone Encounter (Signed)
Referral was sent to Dr. Felecia Shelling at [facility]

## 2021-12-29 DIAGNOSIS — Z23 Encounter for immunization: Secondary | ICD-10-CM | POA: Diagnosis not present

## 2021-12-29 DIAGNOSIS — E782 Mixed hyperlipidemia: Secondary | ICD-10-CM | POA: Diagnosis not present

## 2022-01-02 DIAGNOSIS — M5136 Other intervertebral disc degeneration, lumbar region: Secondary | ICD-10-CM | POA: Diagnosis not present

## 2022-01-02 DIAGNOSIS — G894 Chronic pain syndrome: Secondary | ICD-10-CM | POA: Diagnosis not present

## 2022-01-02 DIAGNOSIS — M47812 Spondylosis without myelopathy or radiculopathy, cervical region: Secondary | ICD-10-CM | POA: Diagnosis not present

## 2022-01-02 DIAGNOSIS — Z5181 Encounter for therapeutic drug level monitoring: Secondary | ICD-10-CM | POA: Diagnosis not present

## 2022-01-02 DIAGNOSIS — M961 Postlaminectomy syndrome, not elsewhere classified: Secondary | ICD-10-CM | POA: Diagnosis not present

## 2022-01-02 DIAGNOSIS — M5459 Other low back pain: Secondary | ICD-10-CM | POA: Diagnosis not present

## 2022-01-02 DIAGNOSIS — M5416 Radiculopathy, lumbar region: Secondary | ICD-10-CM | POA: Diagnosis not present

## 2022-01-18 ENCOUNTER — Emergency Department (HOSPITAL_COMMUNITY): Payer: Medicare Other

## 2022-01-18 ENCOUNTER — Emergency Department (HOSPITAL_COMMUNITY)
Admission: EM | Admit: 2022-01-18 | Discharge: 2022-01-18 | Disposition: A | Discharge status: Home / Self Care | Payer: Medicare Other | Attending: Emergency Medicine | Admitting: Emergency Medicine

## 2022-01-18 ENCOUNTER — Other Ambulatory Visit: Payer: Self-pay

## 2022-01-18 ENCOUNTER — Encounter (HOSPITAL_COMMUNITY): Payer: Self-pay

## 2022-01-18 DIAGNOSIS — J90 Pleural effusion, not elsewhere classified: Secondary | ICD-10-CM | POA: Diagnosis not present

## 2022-01-18 DIAGNOSIS — R339 Retention of urine, unspecified: Secondary | ICD-10-CM | POA: Diagnosis not present

## 2022-01-18 DIAGNOSIS — Z950 Presence of cardiac pacemaker: Secondary | ICD-10-CM | POA: Insufficient documentation

## 2022-01-18 DIAGNOSIS — R140 Abdominal distension (gaseous): Secondary | ICD-10-CM | POA: Diagnosis not present

## 2022-01-18 DIAGNOSIS — R55 Syncope and collapse: Secondary | ICD-10-CM | POA: Diagnosis not present

## 2022-01-18 DIAGNOSIS — J939 Pneumothorax, unspecified: Secondary | ICD-10-CM | POA: Diagnosis not present

## 2022-01-18 DIAGNOSIS — R42 Dizziness and giddiness: Secondary | ICD-10-CM | POA: Diagnosis not present

## 2022-01-18 DIAGNOSIS — R404 Transient alteration of awareness: Secondary | ICD-10-CM | POA: Diagnosis not present

## 2022-01-18 DIAGNOSIS — Z743 Need for continuous supervision: Secondary | ICD-10-CM | POA: Diagnosis not present

## 2022-01-18 DIAGNOSIS — K6389 Other specified diseases of intestine: Secondary | ICD-10-CM | POA: Diagnosis not present

## 2022-01-18 LAB — CBC WITH DIFFERENTIAL/PLATELET
Abs Immature Granulocytes: 0.02 10*3/uL (ref 0.00–0.07)
Basophils Absolute: 0.1 10*3/uL (ref 0.0–0.1)
Basophils Relative: 1 %
Eosinophils Absolute: 0 10*3/uL (ref 0.0–0.5)
Eosinophils Relative: 1 %
HCT: 44.9 % (ref 36.0–46.0)
Hemoglobin: 14.4 g/dL (ref 12.0–15.0)
Immature Granulocytes: 0 %
Lymphocytes Relative: 34 %
Lymphs Abs: 2.9 10*3/uL (ref 0.7–4.0)
MCH: 29.3 pg (ref 26.0–34.0)
MCHC: 32.1 g/dL (ref 30.0–36.0)
MCV: 91.4 fL (ref 80.0–100.0)
Monocytes Absolute: 0.4 10*3/uL (ref 0.1–1.0)
Monocytes Relative: 5 %
Neutro Abs: 5 10*3/uL (ref 1.7–7.7)
Neutrophils Relative %: 59 %
Platelets: 302 10*3/uL (ref 150–400)
RBC: 4.91 MIL/uL (ref 3.87–5.11)
RDW: 14.2 % (ref 11.5–15.5)
WBC: 8.4 10*3/uL (ref 4.0–10.5)
nRBC: 0 % (ref 0.0–0.2)

## 2022-01-18 LAB — COMPREHENSIVE METABOLIC PANEL
ALT: 9 U/L (ref 0–44)
AST: 15 U/L (ref 15–41)
Albumin: 3.8 g/dL (ref 3.5–5.0)
Alkaline Phosphatase: 50 U/L (ref 38–126)
Anion gap: 9 (ref 5–15)
BUN: 7 mg/dL — ABNORMAL LOW (ref 8–23)
CO2: 25 mmol/L (ref 22–32)
Calcium: 8.9 mg/dL (ref 8.9–10.3)
Chloride: 107 mmol/L (ref 98–111)
Creatinine, Ser: 0.84 mg/dL (ref 0.44–1.00)
GFR, Estimated: >60 mL/min (ref >60)
Glucose, Bld: 99 mg/dL (ref 70–99)
Potassium: 3.5 mmol/L (ref 3.5–5.1)
Sodium: 141 mmol/L (ref 135–145)
Total Bilirubin: 1 mg/dL (ref 0.3–1.2)
Total Protein: 6.6 g/dL (ref 6.5–8.1)

## 2022-01-18 LAB — URINALYSIS, ROUTINE W REFLEX MICROSCOPIC
Bacteria, UA: NONE SEEN
Bilirubin Urine: NEGATIVE
Glucose, UA: NEGATIVE mg/dL
Hgb urine dipstick: NEGATIVE
Ketones, ur: NEGATIVE mg/dL
Leukocytes,Ua: NEGATIVE
Nitrite: NEGATIVE
Protein, ur: 100 mg/dL — AB
Specific Gravity, Urine: 1.016 (ref 1.005–1.030)
pH: 6 (ref 5.0–8.0)

## 2022-01-18 LAB — CBG MONITORING, ED: Glucose-Capillary: 121 mg/dL — ABNORMAL HIGH (ref 70–99)

## 2022-01-18 NOTE — ED Triage Notes (Signed)
Pt BIB EMS from [facility]. Pt c/o near syncopal episode, become light headed and dizzy, pt lowered herself to the floor. Pt denies fall, denies LOC, denies hitting her head. Pt states she was diagnosed with a blood clot in her brain and chest, and left leg. Pt on Lovenox injections.  142/90 79 HR 18 resp 100% RA CBG 144 EKG sinus rhythm Pt has pacemaker

## 2022-01-18 NOTE — ED Provider Triage Note (Signed)
Emergency Medicine Provider Triage Evaluation Note  [name] , a 66 y.o. female  was evaluated in triage.  Pt complains of a near syncopal episode.  Patient was at the grocery store this morning when she felt lightheaded.  She was able to sit down without falling.  She states that she felt like she was going to blackout.  EMS transported to the hospital.  The patient does have a pacemaker and is currently on Lovenox due to reported blood clot in her leg, chest, and head.  She currently denies any symptoms including chest pain, shortness of breath, lightheadedness, headache, abdominal pain, nausea, vomiting, urinary symptoms.  Review of Systems  Positive: As above Negative: As above  Physical Exam  There were no vitals taken for this visit. Gen:   Awake, no distress   Resp:  Normal effort  MSK:   Moves extremities without difficulty  Other:    Medical Decision Making  Medically screening exam initiated at 1:25 PM.  Appropriate orders placed.  [name] was informed that the remainder of the evaluation will be completed by another provider, this initial triage assessment does not replace that evaluation, and the importance of remaining in the ED until their evaluation is complete.     Dorothyann Peng, PA-C 01/18/22 1326

## 2022-01-18 NOTE — Discharge Instructions (Signed)
You were seen today due to a near syncopal episode.  Your work-up was reassuring for no signs of life-threatening causes.  It is unclear as to what caused dizziness and lightheadedness.  This could be related to your previous episodes of vertigo.  Please follow-up with your primary care provider. A Foley catheter was placed today due to urinary retention.  Please call the urologist listed above to schedule a follow-up appointment for removal of the Foley catheter and further evaluation.

## 2022-01-18 NOTE — ED Provider Notes (Signed)
[facility] DEPT Provider Note   CSN: [id number] Arrival date & time: 01/18/22  1309     History  Chief Complaint  Patient presents with   Near Syncope    [name] is a 66 y.o. female.  Patient presents to the emergency department complaining of a near syncopal episode.  Patient was shopping at a grocery store and she felt lightheaded and dizzy.  She was able to lower herself to the floor.  She denies falling, hitting her head, losing consciousness.  EMS was called and transported to the emergency department.  Patient currently has diagnosis of blood clot in her brain, chest, and left leg.  She is taking Lovenox at this time for this.  Patient also has a pacemaker.  Patient denies any symptoms at this time.  She denies chest pain, shortness of breath, abdominal pain, lightheadedness, dizziness, headache, urinary symptoms.  She does states she has not urinated in over a day but denies the need to urinate at this time.  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 Authorizing Provider  ALPRAZolam Duanne Moron) 0.5 MG tablet Take 1 tablet (0.5 mg total) by mouth 2 (two) times daily as needed for anxiety. 12/21/21   Donnal Moat T, PA-C  amitriptyline (ELAVIL) 25 MG tablet TAKE 2 TABLETS BY MOUTH AT BEDTIME. 11/27/21   Hurst, Helene Kelp T, PA-C  divalproex (DEPAKOTE) 250 MG DR tablet Take 1 tablet (250 mg total) by mouth in the morning, at noon, and at bedtime. 12/21/21   Donnal Moat T, PA-C  enoxaparin (LOVENOX) 60 MG/0.6ML injection Inject 0.5 mLs (50 mg total) into the skin 2 (two) times daily. 12/20/21 04/19/22  Lincoln Brigham, PA-C  hydrOXYzine (ATARAX) 25 MG tablet Take 0.5-1 tablets  (12.5-25 mg total) by mouth 3 (three) times daily as needed. 12/21/21   Addison Lank, PA-C  lactulose (CHRONULAC) 10 GM/15ML solution Take 15 mLs (10 g total) by mouth 2 (two) times daily as needed for mild constipation. Titrate for 2-3 BM daily. Hold if loose stool. 11/25/21   Antonieta Pert, MD  risperiDONE (RISPERDAL) 3 MG tablet Take 1 tablet (3 mg total) by mouth at bedtime. 12/21/21   Donnal Moat T, PA-C  tiZANidine (ZANAFLEX) 4 MG tablet Take 4 mg by mouth every 8 (eight) hours as needed for muscle spasms.    [provider]      Allergies    Antibiotic ear [neomycin-polymyxin-hc] and Penicillins    Review of Systems   Review of Systems  Respiratory:  Negative for shortness of breath.   Cardiovascular:  Negative for chest pain.  Gastrointestinal:  Negative for abdominal pain and nausea.  Genitourinary:  Negative for dysuria.  Neurological:  Positive for dizziness and light-headedness. Negative for syncope and weakness.    Physical Exam Updated Vital Signs BP (!) 155/91   Pulse 68   Temp 98.7 F (37.1 C) (Oral)   Resp 20   SpO2 100%  Physical Exam Vitals and nursing note reviewed.  Constitutional:      General: She is not in acute distress.    Appearance: She is well-developed.  HENT:     Head: Normocephalic and atraumatic.     Mouth/Throat:     Mouth:  Mucous membranes are moist.  Eyes:     Extraocular Movements: Extraocular movements intact.     Conjunctiva/sclera: Conjunctivae normal.     Pupils: Pupils are equal, round, and reactive to light.  Cardiovascular:     Rate and Rhythm: Normal rate and regular rhythm.     Heart sounds: No murmur heard. Pulmonary:     Effort: Pulmonary effort is normal. No respiratory distress.     Breath sounds: Normal breath sounds.  Abdominal:     Palpations: Abdomen is soft.     Tenderness: There is no abdominal tenderness.  Musculoskeletal:        General: No swelling. Normal range of motion.     Cervical back: Neck  supple.  Skin:    General: Skin is warm and dry.     Capillary Refill: Capillary refill takes less than 2 seconds.  Neurological:     Mental Status: She is alert.     Comments: Cranial nerves II through VII, XI, XII intact.  Gait intact.  No unilateral weakness or sensation loss noted  Psychiatric:        Mood and Affect: Mood normal.     ED Results / Procedures / Treatments   Labs (all labs ordered are listed, but only abnormal results are displayed) Labs Reviewed  COMPREHENSIVE METABOLIC PANEL - Abnormal; Notable for the following components:      Result Value   BUN 7 (*)    All other components within normal limits  URINALYSIS, ROUTINE W REFLEX MICROSCOPIC - Abnormal; Notable for the following components:   Protein, ur 100 (*)    All other components within normal limits  CBG MONITORING, ED - Abnormal; Notable for the following components:   Glucose-Capillary 121 (*)    All other components within normal limits  CBC WITH DIFFERENTIAL/PLATELET    EKG EKG Interpretation  Date/Time:  Thursday January 18 2022 13:22:47 EDT Ventricular Rate:  79 PR Interval:  137 QRS Duration: 70 QT Interval:  473 QTC Calculation: 543 R Axis:   28 Text Interpretation: Sinus rhythm Biatrial enlargement Prolonged QT interval Nonspecific T wave abnormality Confirmed by Lajean Saver [phone]) on 01/18/2022 1:24:55 PM  Radiology DG Abdomen 1 View  Result Date: 01/18/2022 CLINICAL DATA:  Near syncope, lightheadedness, dizziness, possible pneumatosis EXAM: ABDOMEN - 1 VIEW COMPARISON:  Chest x-ray 01/18/2022, abdominal CT 11/11/2021 FINDINGS: Supine frontal view of the abdomen and pelvis excludes the bilateral hemidiaphragms by collimation. The lucencies seen within the right upper quadrant on the preceding chest x-ray likely represent interface between the hepatic and renal silhouettes. There is mild gaseous distension of the small and large bowel, without clear transition point. Findings could  reflect generalized ileus. No masses or abnormal calcifications. Postsurgical changes in the lower lumbar spine. No acute bony abnormalities. IMPRESSION: 1. Mild diffuse gaseous distension of the large and small bowel, which could reflect ileus. 2. No evidence of pneumatosis within the visualized portions of the abdomen and pelvis. The reported lucencies within the right upper quadrant on prior chest x-ray likely reflect confluence of shadows from the hepatic and right renal silhouettes. Electronically Signed   By: Randa Ngo M.D.   On: 01/18/2022 17:27   DG Chest 2 View  Result Date: 01/18/2022 CLINICAL DATA:  Near-syncope EXAM: CHEST - 2 VIEW COMPARISON:  11/22/2021 FINDINGS: Left-sided dual lead cardiac device in place with unchanged lead positioning. Pleural effusion. Pneumothorax. No focal airspace opacity. Cardiac and mediastinal contours. No displaced rib fractures. Visualized upper abdomen is  notable for multiple prominent loops of large bowel and a gas distended stomach. In the right upper quadrant there appear to be linear lucencies presumed to be along loops of small bowel. IMPRESSION: 1. No acute cardiopulmonary process identified. 2. Visualized upper abdomen is notable for multiple prominent loops of large bowel and a gas distended stomach. In the right upper quadrant there appear to be linear lucencies presumed to be along loops of small bowel. If there is concern for abdominal pathology, particularly pneumatosis, further evaluation with an abdominal radiograph is recommended. Electronically Signed   By: Marin Roberts M.D.   On: 01/18/2022 14:29    Procedures Procedures    Medications Ordered in ED Medications - No data to display  ED Course/ Medical Decision Making/ A&P                           Medical Decision Making Amount and/or Complexity of Data Reviewed Labs: ordered. Radiology: ordered. ECG/medicine tests: ordered.   This patient presents to the ED for concern of near  syncope, this involves an extensive number of treatment options, and is a complaint that carries with it a high risk of complications and morbidity.  The differential diagnosis includes vertigo, vasovagal response, dysrhythmia, dehydration, CVA, and others   Co morbidities that complicate the patient evaluation  History of syncope and collapse, vertigo   Additional history obtained:  Additional history obtained from EMS External records from outside source obtained and reviewed including discharge summary from September 10 after admission for altered mental status   Lab Tests:  I Ordered, and personally interpreted labs.  The pertinent results include: Unremarkable CMP, CBC, UA with 100 protein   Imaging Studies ordered:  I ordered imaging studies including chest x-ray I independently visualized and interpreted imaging which showed  1. No acute cardiopulmonary process identified.  2. Visualized upper abdomen is notable for multiple prominent loops  of large bowel and a gas distended stomach. In the right upper  quadrant there appear to be linear lucencies presumed to be along  loops of small bowel. If there is concern for abdominal pathology,  particularly pneumatosis, further evaluation with an abdominal  radiograph is recommended.   1. Mild diffuse gaseous distension of the large and small bowel,  which could reflect ileus.  2. No evidence of pneumatosis within the visualized portions of the  abdomen and pelvis. The reported lucencies within the right upper  quadrant on prior chest x-ray likely reflect confluence of shadows  from the hepatic and right renal silhouettes.   I agree with the radiologist interpretation   Cardiac Monitoring: / EKG:  The patient was maintained on a cardiac monitor.  I personally viewed and interpreted the cardiac monitored which showed an underlying rhythm of: Sinus rhythm  Test / Admission - Considered:  The patient is asymptomatic at this  time.  Work-up was unrevealing of any cause for her near syncopal episode earlier today.  She does complain of some urinary retention.  Bladder scan showed that she was retaining approximately 400 mL of urine.  Foley catheter was placed with quick return of urine.  Patient states she feels much better at this time.  Plan to discharge patient home at this time with her urinary catheter in place to follow-up with urology for evaluation of urinary retention. Patient denies chest pain, EKG with no ischemic changes.  Abdominal x-ray does show possible cast pattern concerning for potential ileus but patient is  not complaining of inability to have bowel movements and has not had problems with abdominal pain.  Chest x-ray otherwise unremarkable.  Very low clinical suspicion of ACS.  Blood pressure has been normal, doubt dehydration at this time.  Most likely culprit is either vasovagal syncope or episode of BPPV.  Patient does have history of benign vertigo.  Recommend patient follow-up with primary care provider for further evaluation if she has further episodes or return to the emergency department         Final Clinical Impression(s) / ED Diagnoses Final diagnoses:  Near syncope  Urinary retention    Rx / DC Orders ED Discharge Orders     None         Ronny Bacon 01/18/22 Patrecia Pour    Charlesetta Shanks, MD 02/08/22 1711

## 2022-01-18 NOTE — ED Notes (Signed)
Bladder Scan 413

## 2022-01-19 ENCOUNTER — Telehealth: Payer: Self-pay

## 2022-01-21 NOTE — Telephone Encounter (Signed)
Increase the Hydroxyzine 25 mg to 2 po tid prn anxiety.  For the record, unless she's getting Xanax from another provider, she doesn't have any to take. (See note 12/21/2021)  I recommend [facility] for a new provider. [phone].

## 2022-01-22 NOTE — Telephone Encounter (Signed)
Patient notified

## 2022-01-27 ENCOUNTER — Emergency Department (HOSPITAL_COMMUNITY): Payer: Medicare Other

## 2022-01-27 ENCOUNTER — Other Ambulatory Visit: Payer: Self-pay

## 2022-01-27 ENCOUNTER — Encounter (HOSPITAL_COMMUNITY): Payer: Self-pay

## 2022-01-27 ENCOUNTER — Emergency Department (HOSPITAL_COMMUNITY)
Admission: EM | Admit: 2022-01-27 | Discharge: 2022-01-27 | Disposition: A | Discharge status: Home / Self Care | Payer: Medicare Other | Attending: Emergency Medicine | Admitting: Emergency Medicine

## 2022-01-27 DIAGNOSIS — R09A2 Foreign body sensation, throat: Secondary | ICD-10-CM

## 2022-01-27 DIAGNOSIS — J0510 Acute epiglottitis without obstruction: Secondary | ICD-10-CM | POA: Diagnosis not present

## 2022-01-27 DIAGNOSIS — F458 Other somatoform disorders: Secondary | ICD-10-CM | POA: Insufficient documentation

## 2022-01-27 DIAGNOSIS — F419 Anxiety disorder, unspecified: Secondary | ICD-10-CM | POA: Diagnosis not present

## 2022-01-27 DIAGNOSIS — N390 Urinary tract infection, site not specified: Secondary | ICD-10-CM | POA: Insufficient documentation

## 2022-01-27 DIAGNOSIS — Z960 Presence of urogenital implants: Secondary | ICD-10-CM | POA: Insufficient documentation

## 2022-01-27 DIAGNOSIS — R070 Pain in throat: Secondary | ICD-10-CM | POA: Diagnosis present

## 2022-01-27 LAB — URINALYSIS, ROUTINE W REFLEX MICROSCOPIC
Bacteria, UA: NONE SEEN
Bilirubin Urine: NEGATIVE
Glucose, UA: NEGATIVE mg/dL
Ketones, ur: NEGATIVE mg/dL
Nitrite: POSITIVE — AB
Protein, ur: 100 mg/dL — AB
RBC / HPF: >50 RBC/hpf — ABNORMAL HIGH (ref 0–5)
Specific Gravity, Urine: 1.018 (ref 1.005–1.030)
WBC, UA: >50 WBC/hpf — ABNORMAL HIGH (ref 0–5)
pH: 6 (ref 5.0–8.0)

## 2022-01-27 LAB — CBC WITH DIFFERENTIAL/PLATELET
Abs Immature Granulocytes: 0.03 10*3/uL (ref 0.00–0.07)
Basophils Absolute: 0.1 10*3/uL (ref 0.0–0.1)
Basophils Relative: 1 %
Eosinophils Absolute: 0.1 10*3/uL (ref 0.0–0.5)
Eosinophils Relative: 1 %
HCT: 46.9 % — ABNORMAL HIGH (ref 36.0–46.0)
Hemoglobin: 15.2 g/dL — ABNORMAL HIGH (ref 12.0–15.0)
Immature Granulocytes: 0 %
Lymphocytes Relative: 34 %
Lymphs Abs: 3.4 10*3/uL (ref 0.7–4.0)
MCH: 29.1 pg (ref 26.0–34.0)
MCHC: 32.4 g/dL (ref 30.0–36.0)
MCV: 89.8 fL (ref 80.0–100.0)
Monocytes Absolute: 0.7 10*3/uL (ref 0.1–1.0)
Monocytes Relative: 7 %
Neutro Abs: 5.7 10*3/uL (ref 1.7–7.7)
Neutrophils Relative %: 57 %
Platelets: 272 10*3/uL (ref 150–400)
RBC: 5.22 MIL/uL — ABNORMAL HIGH (ref 3.87–5.11)
RDW: 13.5 % (ref 11.5–15.5)
WBC: 10 10*3/uL (ref 4.0–10.5)
nRBC: 0 % (ref 0.0–0.2)

## 2022-01-27 LAB — BASIC METABOLIC PANEL
Anion gap: 10 (ref 5–15)
BUN: 13 mg/dL (ref 8–23)
CO2: 26 mmol/L (ref 22–32)
Calcium: 9.5 mg/dL (ref 8.9–10.3)
Chloride: 105 mmol/L (ref 98–111)
Creatinine, Ser: 0.81 mg/dL (ref 0.44–1.00)
GFR, Estimated: >60 mL/min (ref >60)
Glucose, Bld: 115 mg/dL — ABNORMAL HIGH (ref 70–99)
Potassium: 3 mmol/L — ABNORMAL LOW (ref 3.5–5.1)
Sodium: 141 mmol/L (ref 135–145)

## 2022-01-27 LAB — VALPROIC ACID LEVEL: Valproic Acid Lvl: <10 ug/mL — ABNORMAL LOW (ref 50.0–100.0)

## 2022-01-27 MED ORDER — MORPHINE SULFATE (PF) 4 MG/ML IV SOLN
4.0000 mg | Freq: Once | INTRAVENOUS | Status: AC
  Administered 2022-01-27: 4 mg via INTRAVENOUS
  Filled 2022-01-27: qty 1

## 2022-01-27 MED ORDER — SODIUM CHLORIDE 0.9 % IV SOLN
1.0000 g | Freq: Once | INTRAVENOUS | Status: AC
  Administered 2022-01-27: 1 g via INTRAVENOUS
  Filled 2022-01-27: qty 10

## 2022-01-27 MED ORDER — POTASSIUM CHLORIDE 10 MEQ/100ML IV SOLN
10.0000 meq | Freq: Once | INTRAVENOUS | Status: AC
  Administered 2022-01-27: 10 meq via INTRAVENOUS
  Filled 2022-01-27: qty 100

## 2022-01-27 MED ORDER — LORAZEPAM 2 MG/ML IJ SOLN
1.0000 mg | Freq: Once | INTRAMUSCULAR | Status: AC
  Administered 2022-01-27: 1 mg via INTRAVENOUS
  Filled 2022-01-27: qty 1

## 2022-01-27 MED ORDER — IOHEXOL 300 MG/ML  SOLN
75.0000 mL | Freq: Once | INTRAMUSCULAR | Status: AC | PRN
  Administered 2022-01-27: 75 mL via INTRAVENOUS

## 2022-01-27 MED ORDER — CEPHALEXIN 500 MG PO CAPS: 500.0000 mg | ORAL_CAPSULE | Freq: Three times a day (TID) | ORAL | 0 refills | Status: DC

## 2022-01-27 NOTE — Discharge Instructions (Signed)
As we discussed, I do not see any foreign body or mass in your neck  You have a urinary tract infection and I have prescribed Keflex 3 times a day for 5 days  Please call urology again to get a closer appointment for catheter removal  You also need to follow-up with your primary care doctor.  Your Depakote level is low so please take your Depakote as prescribed  Return to ER if you have trouble swallowing, fever, pain with your catheter

## 2022-01-27 NOTE — ED Provider Notes (Signed)
[facility] DEPT Provider Note   CSN: [id number] Arrival date & time: 01/27/22  1101     History  Chief Complaint  Patient presents with   Throat Pain    [name] is a 66 y.o. female hx of anxiety, here presenting with multiple complaints.  Patient was seen here for near syncope about a week ago.  Patient was noted to be in urinary retention and had Foley catheter placed.  Patient states that she has some dysuria and discomfort in her catheter.  She states that her appointment with urology is not until next week.  Patient also noticed some trouble swallowing for the last week or so.  She states that she has a polyp was removed 20 years ago from her throat and she was concerned that it has grown back.  She denies actually having anything stuck in her throat.  The history is provided by the patient.       Home Medications Prior to Admission medications   Medication Sig Start Date End Date Taking? Authorizing Provider  ALPRAZolam Duanne Moron) 0.5 MG tablet Take 1 tablet (0.5 mg total) by mouth 2 (two) times daily as needed for anxiety. 12/21/21   Donnal Moat T, PA-C  amitriptyline (ELAVIL) 25 MG tablet TAKE 2 TABLETS BY MOUTH AT BEDTIME. 11/27/21   Hurst, Helene Kelp T, PA-C  divalproex (DEPAKOTE) 250 MG DR tablet Take 1 tablet (250 mg total) by mouth in the morning, at noon, and at bedtime. 12/21/21   Donnal Moat T, PA-C  enoxaparin (LOVENOX) 60 MG/0.6ML injection Inject 0.5 mLs (50 mg total) into the skin 2 (two) times daily. 12/20/21 04/19/22  Lincoln Brigham, PA-C  hydrOXYzine (ATARAX) 25 MG tablet Take 0.5-1 tablets (12.5-25 mg total) by mouth 3 (three) times daily as needed. 12/21/21   Addison Lank, PA-C  lactulose (CHRONULAC) 10 GM/15ML solution Take 15 mLs (10 g total) by mouth 2 (two) times daily as needed for mild constipation. Titrate for 2-3 BM daily. Hold if loose stool. 11/25/21   Antonieta Pert, MD  risperiDONE (RISPERDAL) 3 MG tablet Take 1 tablet (3  mg total) by mouth at bedtime. 12/21/21   Donnal Moat T, PA-C  tiZANidine (ZANAFLEX) 4 MG tablet Take 4 mg by mouth every 8 (eight) hours as needed for muscle spasms.    [provider]      Allergies    Antibiotic ear [neomycin-polymyxin-hc] and Penicillins    Review of Systems   Review of Systems  HENT:  Positive for trouble swallowing.   Genitourinary:  Positive for dysuria.  All other systems reviewed and are negative.   Physical Exam Updated Vital Signs BP (!) 170/92 (BP Location: Right Arm) Comment: RN made aware.  Pulse 68   Temp 97.6 F (36.4 C) (Oral)   Resp 16   Ht '5\' 7"'$  (1.702 m)   Wt 47.6 kg   SpO2 100%   BMI 16.45 kg/m  Physical Exam Vitals and nursing note reviewed.  Constitutional:      Comments: Anxious  HENT:     Head: Normocephalic.     Nose: Nose normal.     Mouth/Throat:     Mouth: Mucous membranes are dry.  Eyes:     Extraocular Movements: Extraocular movements intact.     Pupils: Pupils are equal, round, and reactive to light.  Cardiovascular:     Rate and Rhythm: Normal rate and regular rhythm.     Pulses: Normal pulses.  Heart sounds: Normal heart sounds.  Pulmonary:     Effort: Pulmonary effort is normal.     Breath sounds: Normal breath sounds.  Abdominal:     General: Abdomen is flat.     Palpations: Abdomen is soft.  Musculoskeletal:        General: Normal range of motion.     Cervical back: Normal range of motion and neck supple.  Skin:    General: Skin is warm.     Capillary Refill: Capillary refill takes less than 2 seconds.  Neurological:     General: No focal deficit present.     Mental Status: She is alert and oriented to person, place, and time. Mental status is at baseline.  Psychiatric:        Mood and Affect: Mood normal.        Behavior: Behavior normal.     ED Results / Procedures / Treatments   Labs (all labs ordered are listed, but only abnormal results are displayed) Labs Reviewed  BASIC  METABOLIC PANEL - Abnormal; Notable for the following components:      Result Value   Potassium 3.0 (*)    Glucose, Bld 115 (*)    All other components within normal limits  CBC WITH DIFFERENTIAL/PLATELET - Abnormal; Notable for the following components:   RBC 5.22 (*)    Hemoglobin 15.2 (*)    HCT 46.9 (*)    All other components within normal limits  URINALYSIS, ROUTINE W REFLEX MICROSCOPIC - Abnormal; Notable for the following components:   Color, Urine AMBER (*)    APPearance CLOUDY (*)    Hgb urine dipstick MODERATE (*)    Protein, ur 100 (*)    Nitrite POSITIVE (*)    Leukocytes,Ua LARGE (*)    RBC / HPF >50 (*)    WBC, UA >50 (*)    All other components within normal limits  VALPROIC ACID LEVEL - Abnormal; Notable for the following components:   Valproic Acid Lvl <10 (*)    All other components within normal limits  URINE CULTURE    EKG None  Radiology CT Soft Tissue Neck W Contrast  Result Date: 01/27/2022 CLINICAL DATA:  Epiglottitis or tonsillitis suspected EXAM: CT NECK WITH CONTRAST TECHNIQUE: Multidetector CT imaging of the neck was performed using the standard protocol following the bolus administration of intravenous contrast. RADIATION DOSE REDUCTION: This exam was performed according to the departmental dose-optimization program which includes automated exposure control, adjustment of the mA and/or kV according to patient size and/or use of iterative reconstruction technique. CONTRAST:  74m OMNIPAQUE IOHEXOL 300 MG/ML  SOLN COMPARISON:  CTA neck 12/15/2019. FINDINGS: Pharynx and larynx: Normal. No mass or swelling. Unremarkable epiglottis. Salivary glands: No inflammation, mass, or stone. Thyroid: Normal. Lymph nodes: None enlarged or abnormal density. Vascular: Negative. Limited intracranial: Negative. Visualized orbits: Negative. Mastoids and visualized paranasal sinuses: Clear. Skeleton: Solid C4-C7 ACDF. Upper chest: Visualized lung apices are clear. IMPRESSION:  Unremarkable CT of the neck.  No acute findings. Electronically Signed   By: FMargaretha SheffieldM.D.   On: 01/27/2022 19:59    Procedures Procedures    Medications Ordered in ED Medications  potassium chloride 10 mEq in 100 mL IVPB (10 mEq Intravenous New Bag/Given 01/27/22 2017)  cefTRIAXone (ROCEPHIN) 1 g in sodium chloride 0.9 % 100 mL IVPB (1 g Intravenous New Bag/Given 01/27/22 1854)  LORazepam (ATIVAN) injection 1 mg (1 mg Intravenous Given 01/27/22 1851)  morphine (PF) 4 MG/ML injection 4 mg (  4 mg Intravenous Given 01/27/22 1851)  iohexol (OMNIPAQUE) 300 MG/ML solution 75 mL (75 mLs Intravenous Contrast Given 01/27/22 1924)    ED Course/ Medical Decision Making/ A&P                           Medical Decision Making TORUNN CHANCELLOR is a 67 y.o. female here presenting with dysuria and also trouble swallowing.  Patient has a catheter placed about a week ago.  Patient has some discomfort with urination.  Plan to get UA and urine culture to rule out UTI.  Also will get a CT neck to rule out mass.  Her tonsils appear normal and she has no trismus and patient has no stridor or any obvious mass on my exam.  9:32 PM I reviewed patient's labs and intermittently interpreted imaging studies.  Patient's UA showed UTI.  Patient's potassium is 3.0 and supplemented.  CT neck was unremarkable.  Patient is able to swallow liquids in the ED.  Maybe she has a globus sensation.  Of note her Depakote level is less than 10 and she is noncompliant with that.  I told her that she need to take Depakote as prescribed we will also give a course of antibiotics and have her follow-up with urology for Foley removal  Problems Addressed: Globus sensation: acute illness or injury Urinary tract infection associated with indwelling urethral catheter, initial encounter [facility]): acute illness or injury  Amount and/or Complexity of Data Reviewed Labs: ordered. Decision-making details documented in ED Course. Radiology:  ordered and independent interpretation performed. Decision-making details documented in ED Course.  Risk Prescription drug management.    Final Clinical Impression(s) / ED Diagnoses Final diagnoses:  None    Rx / DC Orders ED Discharge Orders     None         Drenda Freeze, MD 01/27/22 2150

## 2022-01-27 NOTE — ED Provider Triage Note (Signed)
Emergency Medicine Provider Triage Evaluation Note  DEVANEY SEGERS , a 66 y.o. female  was evaluated in triage.  Pt complains of feeling like something may be stuck in her throat.  Patient states she has a history of polyps that have been in her throat that had to be surgically removed.  Patient also complains of dysuria.  Denies nausea, vomiting, chest pain, shortness of breath, fevers  Review of Systems  Positive: As above Negative: As above  Physical Exam  BP (!) 149/88 (BP Location: Left Arm)   Pulse 81   Temp 98.6 F (37 C) (Oral)   Resp 18   Ht '5\' 7"'$  (1.702 m)   Wt 47.6 kg   SpO2 100%   BMI 16.45 kg/m  Gen:   Awake, no distress   Resp:  Normal effort  MSK:   Moves extremities without difficulty  Other:    Medical Decision Making  Medically screening exam initiated at 12:15 PM.  Appropriate orders placed.  STORI ROYSE was informed that the remainder of the evaluation will be completed by another provider, this initial triage assessment does not replace that evaluation, and the importance of remaining in the ED until their evaluation is complete.     Dorothyann Peng, PA-C 01/27/22 1219

## 2022-01-27 NOTE — ED Triage Notes (Addendum)
Patient said she feels like something is caught in her throat since Monday. She said she has a history of polyps in her throat and had to get them taken out in the past. Does not have trouble breathing, but said it does hurt to swallow. She also said she feels like she has a bladder infection. She is having painful urination. Has a foley in place.

## 2022-01-30 ENCOUNTER — Telehealth: Payer: Self-pay

## 2022-01-30 LAB — URINE CULTURE: Culture: >=100000 — AB

## 2022-01-30 NOTE — Telephone Encounter (Signed)
        Patient  visited [facility] on 01/18/2022  for Syncope and collapse.   Telephone encounter attempt :  1st  A HIPAA compliant voice message was left requesting a return call.  Instructed patient to call back at [phone].   [facility] Resource Care Guide   [name]  ?? [phone]   Website: [email or web address]  [email or web address]

## 2022-01-31 ENCOUNTER — Inpatient Hospital Stay: Payer: Medicare Other | Attending: Physician Assistant

## 2022-01-31 ENCOUNTER — Telehealth: Payer: Self-pay

## 2022-01-31 ENCOUNTER — Telehealth (HOSPITAL_BASED_OUTPATIENT_CLINIC_OR_DEPARTMENT_OTHER): Payer: Self-pay

## 2022-01-31 DIAGNOSIS — N3090 Cystitis, unspecified without hematuria: Secondary | ICD-10-CM | POA: Diagnosis not present

## 2022-01-31 DIAGNOSIS — N3000 Acute cystitis without hematuria: Secondary | ICD-10-CM | POA: Diagnosis not present

## 2022-01-31 NOTE — Telephone Encounter (Signed)
I'm not giving her any controlled substance for sleep. Have her increase the Hydroxyzine to 50 mg po an hour or so before she wants to go to sleep. No caffeine after noon. Do not get on computer, phone, tablet, within 2 hours of bedtime. Bed is only for sleep or sex, no watching tv, eating, etc. Try sleepy time or chamomile tea within a few hours of going to bed. Make sure she has a strict bedtime routine and don't deviate from that.  I have referred her to neurologist, Dr. Felecia Shelling, they have tried to contact her but have not been able to do so.  Have her call them at [phone] to set up an appointment. Thank you.

## 2022-01-31 NOTE — Progress Notes (Signed)
ED Antimicrobial Stewardship Positive Culture Follow Up   [name] is an 66 y.o. female who presented to [facility] on 01/27/2022 with a chief complaint of  Chief Complaint  Patient presents with   Throat Pain    Recent Results (from the past 720 hour(s))  Urine Culture     Status: Abnormal   Collection Time: 01/27/22  7:02 PM   Specimen: Urine, Clean Catch  Result Value Ref Range Status   Specimen Description   Final    URINE, CLEAN CATCH Performed at [facility], [place] [address], [place], [place] [address]    Special Requests   Final    NONE Performed at [facility], [place] [address] [place], [place] [address]    Culture >=100,000 COLONIES/mL ENTEROBACTER CLOACAE (A)  Final   Report Status 01/30/2022 FINAL  Final   Organism ID, Bacteria ENTEROBACTER CLOACAE (A)  Final      Susceptibility   Enterobacter cloacae - MIC*    CEFAZOLIN RESISTANT Resistant     CEFEPIME <=0.12 SENSITIVE Sensitive     CIPROFLOXACIN <=0.25 SENSITIVE Sensitive     GENTAMICIN <=1 SENSITIVE Sensitive     IMIPENEM <=0.25 SENSITIVE Sensitive     NITROFURANTOIN <=16 SENSITIVE Sensitive     TRIMETH/SULFA <=20 SENSITIVE Sensitive     PIP/TAZO <=4 SENSITIVE Sensitive     * >=100,000 COLONIES/mL ENTEROBACTER CLOACAE    '[x]'$  Treated with Keflex, organism resistant to prescribed antimicrobial   New antibiotic prescription: Bactrim DS 1 tab bid x 3 days  ED Provider: Regan Lemming, MD   [name] 01/31/2022, 2:27 PM Clinical Pharmacist Monday - Friday phone -  [phone] Saturday - Sunday phone - [phone]

## 2022-01-31 NOTE — Telephone Encounter (Signed)
        Patient  visited [facility] on 01/18/2022  for Syncope and collapse.   Telephone encounter attempt :  2nd  Unable to leave message voicemail does not pickup.   [place] Resource Care Guide   ??[email or web address]  ?? [phone]   Website: [email or web address]  [email or web address]

## 2022-01-31 NOTE — Telephone Encounter (Signed)
Patient given recommendations. She is upset that benzos were stopped. She has an appt on 11/20.

## 2022-01-31 NOTE — Telephone Encounter (Signed)
Post ED Visit - Positive Culture Follow-up: Unsuccessful Patient Follow-up  Culture assessed and recommendations reviewed by:  '[]'$  Elenor Quinones, Pharm.D. '[]'$  Heide Guile, Pharm.D., BCPS AQ-ID '[]'$  Parks Neptune, Pharm.D., BCPS '[x]'$  Alycia Rossetti, Pharm.D., BCPS '[]'$  [place], [place].D., BCPS, AAHIVP '[]'$  Legrand Como, Pharm.D., BCPS, AAHIVP '[]'$  Wynell Balloon, PharmD '[]'$  Vincenza Hews, PharmD, BCPS  Positive urine culture  '[]'$  Patient discharged without antimicrobial prescription and treatment is now indicated '[x]'$  Organism is resistant to prescribed ED discharge antimicrobial '[]'$  Patient with positive blood cultures  Plan: D/C Keflex and start Bactrim DS 1 tab BID x 3 days per ED provider Regan Lemming, MD  Unable to contact patient after 3 attempts, letter will be sent to address on file  Glennon Hamilton 01/31/2022, 2:28 PM

## 2022-02-01 ENCOUNTER — Telehealth: Payer: Self-pay

## 2022-02-01 NOTE — Telephone Encounter (Signed)
        Patient  visited [facility] on 01/18/2022  for Syncope and collapse.   Telephone encounter attempt :  3rd  A HIPAA compliant voice message was left requesting a return call.  Instructed patient to call back at [phone].   [facility] Resource Care Guide   ??[email or web address]  ?? [phone]   Website: [email or web address]  [email or web address]

## 2022-02-02 ENCOUNTER — Telehealth: Payer: Self-pay

## 2022-02-02 NOTE — Telephone Encounter (Signed)
        Patient  visited [facility] on 01/27/2022  for Infection and inflammatory reaction due to indwelling urethral catheter, initial encounter.   Telephone encounter attempt :  1st  A HIPAA compliant voice message was left requesting a return call.  Instructed patient to call back at [phone].   [facility] Resource Care Guide   ??[email or web address]  ?? [phone]   Website: [email or web address]  [email or web address]

## 2022-02-04 ENCOUNTER — Emergency Department (HOSPITAL_COMMUNITY)
Admission: EM | Admit: 2022-02-04 | Discharge: 2022-02-04 | Disposition: A | Discharge status: Home / Self Care | Payer: Medicare Other | Attending: Emergency Medicine | Admitting: Emergency Medicine

## 2022-02-04 ENCOUNTER — Encounter (HOSPITAL_COMMUNITY): Payer: Self-pay | Admitting: Emergency Medicine

## 2022-02-04 ENCOUNTER — Emergency Department (HOSPITAL_COMMUNITY): Payer: Medicare Other

## 2022-02-04 ENCOUNTER — Other Ambulatory Visit: Payer: Self-pay

## 2022-02-04 DIAGNOSIS — R404 Transient alteration of awareness: Secondary | ICD-10-CM | POA: Diagnosis not present

## 2022-02-04 DIAGNOSIS — N3289 Other specified disorders of bladder: Secondary | ICD-10-CM | POA: Diagnosis not present

## 2022-02-04 DIAGNOSIS — E876 Hypokalemia: Secondary | ICD-10-CM | POA: Diagnosis not present

## 2022-02-04 DIAGNOSIS — R1031 Right lower quadrant pain: Secondary | ICD-10-CM | POA: Diagnosis not present

## 2022-02-04 DIAGNOSIS — R531 Weakness: Secondary | ICD-10-CM

## 2022-02-04 DIAGNOSIS — R030 Elevated blood-pressure reading, without diagnosis of hypertension: Secondary | ICD-10-CM

## 2022-02-04 DIAGNOSIS — E860 Dehydration: Secondary | ICD-10-CM | POA: Diagnosis not present

## 2022-02-04 DIAGNOSIS — N390 Urinary tract infection, site not specified: Secondary | ICD-10-CM

## 2022-02-04 DIAGNOSIS — T699XXA Effect of reduced temperature, unspecified, initial encounter: Secondary | ICD-10-CM | POA: Diagnosis not present

## 2022-02-04 DIAGNOSIS — R6889 Other general symptoms and signs: Secondary | ICD-10-CM | POA: Diagnosis not present

## 2022-02-04 DIAGNOSIS — Z950 Presence of cardiac pacemaker: Secondary | ICD-10-CM | POA: Insufficient documentation

## 2022-02-04 DIAGNOSIS — R410 Disorientation, unspecified: Secondary | ICD-10-CM | POA: Insufficient documentation

## 2022-02-04 DIAGNOSIS — R0689 Other abnormalities of breathing: Secondary | ICD-10-CM | POA: Diagnosis not present

## 2022-02-04 DIAGNOSIS — Z743 Need for continuous supervision: Secondary | ICD-10-CM | POA: Diagnosis not present

## 2022-02-04 DIAGNOSIS — R55 Syncope and collapse: Secondary | ICD-10-CM | POA: Insufficient documentation

## 2022-02-04 LAB — CBG MONITORING, ED: Glucose-Capillary: 102 mg/dL — ABNORMAL HIGH (ref 70–99)

## 2022-02-04 LAB — COMPREHENSIVE METABOLIC PANEL
ALT: 9 U/L (ref 0–44)
AST: 13 U/L — ABNORMAL LOW (ref 15–41)
Albumin: 3.6 g/dL (ref 3.5–5.0)
Alkaline Phosphatase: 56 U/L (ref 38–126)
Anion gap: 7 (ref 5–15)
BUN: 11 mg/dL (ref 8–23)
CO2: 28 mmol/L (ref 22–32)
Calcium: 8.6 mg/dL — ABNORMAL LOW (ref 8.9–10.3)
Chloride: 108 mmol/L (ref 98–111)
Creatinine, Ser: 0.77 mg/dL (ref 0.44–1.00)
GFR, Estimated: >60 mL/min (ref >60)
Glucose, Bld: 113 mg/dL — ABNORMAL HIGH (ref 70–99)
Potassium: 3.1 mmol/L — ABNORMAL LOW (ref 3.5–5.1)
Sodium: 143 mmol/L (ref 135–145)
Total Bilirubin: 0.6 mg/dL (ref 0.3–1.2)
Total Protein: 6.2 g/dL — ABNORMAL LOW (ref 6.5–8.1)

## 2022-02-04 LAB — URINALYSIS, ROUTINE W REFLEX MICROSCOPIC
Glucose, UA: NEGATIVE mg/dL
Ketones, ur: 40 mg/dL — AB
Nitrite: POSITIVE — AB
Protein, ur: >300 mg/dL — AB
Specific Gravity, Urine: 1.025 (ref 1.005–1.030)
pH: 6.5 (ref 5.0–8.0)

## 2022-02-04 LAB — CBC WITH DIFFERENTIAL/PLATELET
Abs Immature Granulocytes: 0.03 10*3/uL (ref 0.00–0.07)
Basophils Absolute: 0.1 10*3/uL (ref 0.0–0.1)
Basophils Relative: 1 %
Eosinophils Absolute: 0.3 10*3/uL (ref 0.0–0.5)
Eosinophils Relative: 3 %
HCT: 38.5 % (ref 36.0–46.0)
Hemoglobin: 12.6 g/dL (ref 12.0–15.0)
Immature Granulocytes: 0 %
Lymphocytes Relative: 39 %
Lymphs Abs: 3.7 10*3/uL (ref 0.7–4.0)
MCH: 29.5 pg (ref 26.0–34.0)
MCHC: 32.7 g/dL (ref 30.0–36.0)
MCV: 90.2 fL (ref 80.0–100.0)
Monocytes Absolute: 0.6 10*3/uL (ref 0.1–1.0)
Monocytes Relative: 7 %
Neutro Abs: 4.8 10*3/uL (ref 1.7–7.7)
Neutrophils Relative %: 50 %
Platelets: 281 10*3/uL (ref 150–400)
RBC: 4.27 MIL/uL (ref 3.87–5.11)
RDW: 13.9 % (ref 11.5–15.5)
WBC: 9.5 10*3/uL (ref 4.0–10.5)
nRBC: 0 % (ref 0.0–0.2)

## 2022-02-04 LAB — URINALYSIS, MICROSCOPIC (REFLEX): RBC / HPF: >50 RBC/hpf (ref 0–5)

## 2022-02-04 MED ORDER — POTASSIUM CHLORIDE CRYS ER 20 MEQ PO TBCR
40.0000 meq | EXTENDED_RELEASE_TABLET | Freq: Once | ORAL | Status: AC
  Administered 2022-02-04: 40 meq via ORAL
  Filled 2022-02-04: qty 2

## 2022-02-04 MED ORDER — SODIUM CHLORIDE 0.9 % IV SOLN
1.0000 g | Freq: Once | INTRAVENOUS | Status: AC
  Administered 2022-02-04: 1 g via INTRAVENOUS
  Filled 2022-02-04: qty 10

## 2022-02-04 MED ORDER — LACTATED RINGERS IV BOLUS
1000.0000 mL | Freq: Once | INTRAVENOUS | Status: AC
  Administered 2022-02-04: 1000 mL via INTRAVENOUS

## 2022-02-04 MED ORDER — IOHEXOL 300 MG/ML  SOLN
100.0000 mL | Freq: Once | INTRAMUSCULAR | Status: AC | PRN
  Administered 2022-02-04: 100 mL via INTRAVENOUS

## 2022-02-04 MED ORDER — LEVOFLOXACIN 500 MG PO TABS: 500.0000 mg | ORAL_TABLET | Freq: Every day | ORAL | 0 refills | Status: DC

## 2022-02-04 NOTE — ED Notes (Addendum)
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 11/18 1-1 tach. Device function fine. Battery functioning. Fax being sent.

## 2022-02-04 NOTE — ED Triage Notes (Signed)
  Patient BIB EMS for near syncopal episode at home.  Patient was getting up use restroom and almost blacked out.  Has been on antibiotics for 2 weeks for UTI.  Endorses nausea with no vomiting.  Pain 5/10 RLQ, stabbing.

## 2022-02-04 NOTE — ED Provider Notes (Addendum)
Signed out that labs/imaging already reviewed, interrogation device pending, and likely d/c to home if no acute process noted.   Device interrogation complete, with report of no problem w device functioning or other worrisome event.   CT without acute process other than possible uti/bladder infection.   K sl low - has been given kcl.  Abd soft nt. No nv.   Ivf. Rocephin iv.   Po fluids, food. Ambulate in hall.  Pt currently appears stable for d/c.   Rec close pcp f/u. Return precautions provided.         Lajean Saver, MD 02/04/22 [phone]

## 2022-02-04 NOTE — Discharge Instructions (Addendum)
It was our pleasure to provide your ER care today - we hope that you feel better.  Drink plenty of fluids/stay well hydrated. Take antibiotic (levaquiin) for suspected urine infection.  Follow up with urology this coming week as planned.   Follow up closely with primary care doctor in the coming week. Also have blood pressure rechecked then as it is high today. Your potassium level is low (3.1) - eat plenty of fruits and vegetables, take potassium supplement as prescribed, and f/u with your doctor in one week.   Return to ER if worse, new symptoms, fevers, new/severe pain, trouble breathing, weak/fainting, or other emergency concern.

## 2022-02-04 NOTE — ED Notes (Signed)
Ambulated pt around room and from rm 17-RESA. Pt did not require any additional assistance.

## 2022-02-04 NOTE — ED Provider Notes (Signed)
[facility] DEPT Provider Note   CSN: [id number] Arrival date & time: 02/04/22  0158     History  Chief Complaint  Patient presents with   Near Syncope    [name] is a 66 y.o. female.  The history is provided by the patient and medical records.  Near Syncope  [name] is a 66 y.o. female who presents to the Emergency Department complaining of syncope.  She presents to the ED following a syncopal event that occurred last night.  History is provided by the patient as well as the patient's daughter over the phone.  Daughter states that the patient called out from the bathroom and was found on the floor.  Patient stated to her daughter at that time that she thought she passed out.  This was an unwitnessed event.  Patient seemed confused and did not seem to recall events that occurred earlier.  Daughter states that she would keep going back to the bathroom and would lie down on the floor.  She had a similar event on November 11.  At baseline she ambulates independently, but daughter feels that she is unsteady at baseline and would likely benefit from a walker.  She has a Foley catheter in place since her last hospital visit and is scheduled to see urology next week.  She is currently taking Macrobid for UTI.  She is compliant with this medication.  No reports of fevers, vomiting.  Daughter states that the patient frequently gets confused and this has been an ongoing issue.   Hx/o syncope, loop recorder, pacemaker, bipolar d/o, dvt on Lovenox and she is compliant with her medications.      Home Medications Prior to Admission medications   Medication Sig Start Date End Date Taking? Authorizing Provider  nitrofurantoin, macrocrystal-monohydrate, (MACROBID) 100 MG capsule Take 100 mg by mouth every 12 (twelve) hours. 01/31/22  Yes [provider]  ALPRAZolam Duanne Moron) 0.5 MG tablet Take 1 tablet (0.5 mg total) by mouth 2 (two) times daily as  needed for anxiety. 12/21/21   Donnal Moat T, PA-C  amitriptyline (ELAVIL) 25 MG tablet TAKE 2 TABLETS BY MOUTH AT BEDTIME. 11/27/21   Hurst, Helene Kelp T, PA-C  cephALEXin (KEFLEX) 500 MG capsule Take 1 capsule (500 mg total) by mouth 3 (three) times daily. 01/27/22   Drenda Freeze, MD  divalproex (DEPAKOTE) 250 MG DR tablet Take 1 tablet (250 mg total) by mouth in the morning, at noon, and at bedtime. 12/21/21   Donnal Moat T, PA-C  enoxaparin (LOVENOX) 60 MG/0.6ML injection Inject 0.5 mLs (50 mg total) into the skin 2 (two) times daily. 12/20/21 04/19/22  Lincoln Brigham, PA-C  hydrOXYzine (ATARAX) 25 MG tablet Take 0.5-1 tablets (12.5-25 mg total) by mouth 3 (three) times daily as needed. 12/21/21   Addison Lank, PA-C  lactulose (CHRONULAC) 10 GM/15ML solution Take 15 mLs (10 g total) by mouth 2 (two) times daily as needed for mild constipation. Titrate for 2-3 BM daily. Hold if loose stool. 11/25/21   Antonieta Pert, MD  risperiDONE (RISPERDAL) 3 MG tablet Take 1 tablet (3 mg total) by mouth at bedtime. 12/21/21   Donnal Moat T, PA-C  tiZANidine (ZANAFLEX) 4 MG tablet Take 4 mg by mouth every 8 (eight) hours as needed for muscle spasms.    [provider]      Allergies    Antibiotic ear [neomycin-polymyxin-hc] and Penicillins    Review of Systems   Review of Systems  Cardiovascular:  Positive for near-syncope.  All other systems reviewed and are negative.   Physical Exam Updated Vital Signs BP (!) 160/103 (BP Location: Left Arm) Comment (BP Location): 166/100 (118) on the Right Arm  Pulse 71   Temp 98.8 F (37.1 C) (Oral)   Resp 19   Ht '5\' 7"'$  (1.702 m)   Wt 47.6 kg   SpO2 100%   BMI 16.45 kg/m  Physical Exam Vitals and nursing note reviewed.  Constitutional:      Appearance: She is well-developed.  HENT:     Head: Normocephalic and atraumatic.  Cardiovascular:     Rate and Rhythm: Normal rate and regular rhythm.     Heart sounds: No murmur heard. Pulmonary:      Effort: Pulmonary effort is normal. No respiratory distress.     Breath sounds: Normal breath sounds.  Abdominal:     Palpations: Abdomen is soft.     Tenderness: There is no abdominal tenderness. There is no guarding or rebound.  Genitourinary:    Comments: Catheter in place Musculoskeletal:        General: No tenderness.  Skin:    General: Skin is warm and dry.  Neurological:     Mental Status: She is alert and oriented to person, place, and time.     Comments: 5 out of 5 strength in all 4 extremities.  She is alert and oriented but seems slightly confused.  Psychiatric:        Behavior: Behavior normal.     ED Results / Procedures / Treatments   Labs (all labs ordered are listed, but only abnormal results are displayed) Labs Reviewed  COMPREHENSIVE METABOLIC PANEL - Abnormal; Notable for the following components:      Result Value   Potassium 3.1 (*)    Glucose, Bld 113 (*)    Calcium 8.6 (*)    Total Protein 6.2 (*)    AST 13 (*)    All other components within normal limits  URINALYSIS, ROUTINE W REFLEX MICROSCOPIC - Abnormal; Notable for the following components:   APPearance CLOUDY (*)    Hgb urine dipstick LARGE (*)    Bilirubin Urine SMALL (*)    Ketones, ur 40 (*)    Protein, ur >300 (*)    Nitrite POSITIVE (*)    Leukocytes,Ua TRACE (*)    All other components within normal limits  URINALYSIS, MICROSCOPIC (REFLEX) - Abnormal; Notable for the following components:   Bacteria, UA FEW (*)    All other components within normal limits  CBG MONITORING, ED - Abnormal; Notable for the following components:   Glucose-Capillary 102 (*)    All other components within normal limits  CBC WITH DIFFERENTIAL/PLATELET    EKG EKG Interpretation  Date/Time:  Sunday February 04 2022 02:28:47 EST Ventricular Rate:  64 PR Interval:  148 QRS Duration: 91 QT Interval:  467 QTC Calculation: 482 R Axis:   53 Text Interpretation: Sinus rhythm Probable left atrial enlargement  RSR' in V1 or V2, right VCD or RVH Borderline T abnormalities, anterior leads Confirmed by Quintella Reichert [phone]) on 02/04/2022 4:46:35 AM  Radiology CT Abdomen Pelvis W Contrast  Result Date: 02/04/2022 CLINICAL DATA:  Right lower quadrant abdominal pain EXAM: CT ABDOMEN AND PELVIS WITH CONTRAST TECHNIQUE: Multidetector CT imaging of the abdomen and pelvis was performed using the standard protocol following bolus administration of intravenous contrast. RADIATION DOSE REDUCTION: This exam was performed according to the departmental dose-optimization program which includes automated exposure control,  adjustment of the mA and/or kV according to patient size and/or use of iterative reconstruction technique. CONTRAST:  174m OMNIPAQUE IOHEXOL 300 MG/ML  SOLN COMPARISON:  Prior CT scan of the abdomen and pelvis 11/11/2021 FINDINGS: Lower chest: No acute abnormality. Hepatobiliary: No focal liver abnormality is seen. No gallstones, gallbladder wall thickening, or biliary dilatation. Pancreas: Unremarkable. No pancreatic ductal dilatation or surrounding inflammatory changes. Spleen: Normal in size without focal abnormality. Adrenals/Urinary Tract: Adrenal glands are unremarkable. Kidneys are normal, without renal calculi, focal lesion, or hydronephrosis. A Foley catheter is present within the bladder. The bladder wall appears hazy, thickened and hyperemic consistent with cystitis. Stomach/Bowel: Stomach is within normal limits. Appendix appears normal. No evidence of bowel wall thickening, distention, or inflammatory changes. Vascular/Lymphatic: Atherosclerotic calcifications along the abdominal aorta. No evidence of aneurysm or dissection. No suspicious lymphadenopathy. Reproductive: Status post hysterectomy. No adnexal masses. Other: No abdominal wall hernia or abnormality. No abdominopelvic ascites. Musculoskeletal: Surgical changes of prior L4-L5 posterior lumbar interbody fusion with interbody graft in place.  No evidence of hardware complication. IMPRESSION: 1. Thickened irregular bladder wall with evidence of hyperemia. Findings suggest cystitis. Differential includes both infectious and inflammatory etiologies. 2. Otherwise, no acute abnormality within the abdomen or pelvis. 3.  Aortic Atherosclerosis (ICD10-I70.0). Electronically Signed   By: HJacqulynn CadetM.D.   On: 02/04/2022 07:22   CT HEAD WO CONTRAST  Result Date: 02/04/2022 CLINICAL DATA:  Mental status changes, near syncope EXAM: CT HEAD WITHOUT CONTRAST TECHNIQUE: Contiguous axial images were obtained from the base of the skull through the vertex without intravenous contrast. RADIATION DOSE REDUCTION: This exam was performed according to the departmental dose-optimization program which includes automated exposure control, adjustment of the mA and/or kV according to patient size and/or use of iterative reconstruction technique. COMPARISON:  Prior CT scan of the head 05/03/2021 FINDINGS: Brain: No evidence of acute infarction, hemorrhage, hydrocephalus, extra-axial collection or mass lesion/mass effect. Vascular: No hyperdense vessel or unexpected calcification. Skull: Normal. Negative for fracture or focal lesion. Sinuses/Orbits: No acute finding. Other: None. IMPRESSION: No acute intracranial abnormality. Electronically Signed   By: HJacqulynn CadetM.D.   On: 02/04/2022 07:18   DG Chest Port 1 View  Result Date: 02/04/2022 CLINICAL DATA:  66year old female with history of altered mental status. Syncope. EXAM: PORTABLE CHEST 1 VIEW COMPARISON:  Chest x-ray 01/18/2022. FINDINGS: Lung volumes are normal. No consolidative airspace disease. No pleural effusions. No pneumothorax. No pulmonary nodule or mass noted. Pulmonary vasculature and the cardiomediastinal silhouette are within normal limits. Atherosclerosis in the thoracic aorta. Left-sided pacemaker device in place with lead tips projecting over the expected location of the right atrium and  right ventricle. IMPRESSION: 1.  No radiographic evidence of acute cardiopulmonary disease. 2. Aortic atherosclerosis. Electronically Signed   By: DVinnie LangtonM.D.   On: 02/04/2022 05:23    Procedures Procedures    Medications Ordered in ED Medications  potassium chloride SA (KLOR-CON M) CR tablet 40 mEq (40 mEq Oral Given 02/04/22 0712)  iohexol (OMNIPAQUE) 300 MG/ML solution 100 mL (100 mLs Intravenous Contrast Given 02/04/22 09833    ED Course/ Medical Decision Making/ A&P                           Medical Decision Making Amount and/or Complexity of Data Reviewed Labs: ordered. Radiology: ordered. ECG/medicine tests: ordered.  Risk Prescription drug management.   Patient with history of recurrent syncope, pacemaker placements, DVT on anticoagulation, possible  seizure on Depakote here for evaluation following possible syncopal event.  No syncopal event has been witnessed but patient has been found on the floor confused.  At time of evaluation she is confused but has no focal neurologic deficits.  CT head is negative for acute abnormality.  CT abdomen pelvis was obtained, she demonstrates some bladder wall thickening.  UA is not conclusive for UTI in setting of catheter placement.  We will send a culture.  Doubt PE, patient is on her Lovenox and is compliant with dosing per daughter.  BMP with hypokalemia-replaced orally.  Patient will need ambulatory trial as well as interrogation of her device.  If device does not have any concerning arrhythmias and patient is able to ambulate near her baseline feel she is stable for discharge home with outpatient follow-up.  Patient care transferred pending interrogation.        Final Clinical Impression(s) / ED Diagnoses Final diagnoses:  None    Rx / DC Orders ED Discharge Orders     None         Quintella Reichert, MD 02/04/22 [phone]

## 2022-02-05 ENCOUNTER — Ambulatory Visit (INDEPENDENT_AMBULATORY_CARE_PROVIDER_SITE_OTHER): Payer: Medicare Other

## 2022-02-05 ENCOUNTER — Ambulatory Visit (INDEPENDENT_AMBULATORY_CARE_PROVIDER_SITE_OTHER): Payer: Medicare Other | Admitting: Physician Assistant

## 2022-02-05 ENCOUNTER — Encounter: Payer: Self-pay | Admitting: Physician Assistant

## 2022-02-05 ENCOUNTER — Telehealth: Payer: Self-pay

## 2022-02-05 DIAGNOSIS — F99 Mental disorder, not otherwise specified: Secondary | ICD-10-CM

## 2022-02-05 DIAGNOSIS — I495 Sick sinus syndrome: Secondary | ICD-10-CM

## 2022-02-05 DIAGNOSIS — F411 Generalized anxiety disorder: Secondary | ICD-10-CM | POA: Diagnosis not present

## 2022-02-05 DIAGNOSIS — F5105 Insomnia due to other mental disorder: Secondary | ICD-10-CM | POA: Diagnosis not present

## 2022-02-05 DIAGNOSIS — F17200 Nicotine dependence, unspecified, uncomplicated: Secondary | ICD-10-CM

## 2022-02-05 DIAGNOSIS — F410 Panic disorder [episodic paroxysmal anxiety] without agoraphobia: Secondary | ICD-10-CM

## 2022-02-05 DIAGNOSIS — Z79899 Other long term (current) drug therapy: Secondary | ICD-10-CM

## 2022-02-05 DIAGNOSIS — F319 Bipolar disorder, unspecified: Secondary | ICD-10-CM | POA: Diagnosis not present

## 2022-02-05 MED ORDER — HYDROXYZINE HCL 50 MG PO TABS: 50.0000 mg | ORAL_TABLET | Freq: Three times a day (TID) | ORAL | 0 refills | Status: DC | PRN

## 2022-02-05 MED ORDER — GABAPENTIN 100 MG PO CAPS: 100.0000 mg | ORAL_CAPSULE | Freq: Two times a day (BID) | ORAL | 1 refills | Status: DC

## 2022-02-05 NOTE — Progress Notes (Signed)
[facility] Med Check  Patient ID: [name],  MRN: [id number]  PCP: Tabitha Amel, MD  Date of Evaluation: 02/05/2022 Time spent:30 minutes  Chief Complaint:  Chief Complaint   Anxiety   Virtual Visit via Telehealth  I connected with patient by telephone, with their informed consent, and verified patient privacy and that I am speaking with the correct person using two identifiers.  I am private, in my office and the patient is at home.  I discussed the limitations, risks, security and privacy concerns of performing an evaluation and management service by telephone video and the availability of in person appointments. I also discussed with the patient that there may be a patient responsible charge related to this service. The patient expressed understanding and agreed to proceed.   I discussed the assessment and treatment plan with the patient. The patient was provided an opportunity to ask questions and all were answered. The patient agreed with the plan and demonstrated an understanding of the instructions.   The patient was advised to call back or seek an in-person evaluation if the symptoms worsen or if the condition fails to improve as anticipated.  I provided 30 minutes of non-face-to-face time during this encounter.  HISTORY/CURRENT STATUS: HPI for routine med check. Daughter [name] is also on phone call.   [name] and her dtr are very upset that I am not prescribing  Xanax any longer. [name] stated, 'you need to give her Xanax back. It's dangerous to stop it cold [name].' Pt says she's having PA almost daily and is anxious all the time, feeling like something bad is going to happen. Gets SOB and has palpitations, feels shaky on the inside when she has a PA. Usually lasts a few minutes to a few hours. Has been on BZ for years, until she was in [place] visiting another dtr and left the Xanax there accidentally. Her dtr threw it away.  She had an appt w/ me 12/21/2021 stating she  didn't have Xanax since 12/15/2021 and asked for a new Rx. She had been on 3 mg per day for a very long time. Reported no withdrawals. I prescribed a lower dose of Xanax for 5 days, in case she had been taking it, to prevent withdrawals. States Hydroxyzine isn't helping the anxiety at all.   Also having insomnia. Chronic problem and has tried multiple meds, reporting they all failed at one time or another. She admitted taking other people's meds a few times. (See old PN and phone notes.)  She has refused to see Neuro for evaluation as I've recommended numerous times but does have an appt in Dec. She raised her voice telling me that I am not treating her right and I don't have her best interest in mind by not giving her sleep medicine and Xanax. She doesn't feel like she needs to be evaluated by sleep specialist, she only wants a Rx for it.   Has anhedonia, energy and motivation are low "because I don't get enough sleep and I'm anxious all the time." Personal hygiene is nl. ADLs are limited b/c of her physical health. Appetite is nl, wt stable. Isolating. Unable to work d/t physical and mental health. No SI/HI.  Is more irritable, states b/c she is anxious all the time and doesn't sleep. Patient denies increased energy with decreased need for sleep, increased talkativeness, racing thoughts, impulsivity or risky behaviors, increased spending, increased libido, grandiosity, paranoia, or hallucinations.  Review of Systems  Constitutional:  Positive for malaise/fatigue.  HENT: Negative.    Eyes: Negative.   Respiratory:  Positive for shortness of breath.        When she has a PA  Cardiovascular:  Positive for palpitations.       With panic attacks  Gastrointestinal: Negative.   Genitourinary: Negative.   Musculoskeletal: Negative.   Skin: Negative.   Neurological: Negative.   Endo/Heme/Allergies: Negative.   Psychiatric/Behavioral:         See HPI   Individual Medical History/ Review of Systems:  Changes? :Yes   see notes on chart.   Past medications for mental health diagnoses include: Trazodone, Belsomra, Risperdal, Zoloft, hydroxyzine, mirtazapine, Xanax, Valium, Wellbutrin, Abilify, gabapentin, Strattera, Vyvanse, Ambien, Lunesta, Dayvigo didn't help, Latuda, Quviviq, Sonata  Allergies: Penicillins  Current Medications: No current facility-administered medications for this visit. No current outpatient medications on file.  Facility-Administered Medications Ordered in Other Visits:    0.9 %  sodium chloride infusion, , Intravenous, Continuous, Pokhrel, Laxman, MD, Last Rate: 75 mL/hr at 02/14/22 2007, New Bag at 02/14/22 2007   acetaminophen (TYLENOL) tablet 650 mg, 650 mg, Oral, Q6H PRN **OR** acetaminophen (TYLENOL) suppository 650 mg, 650 mg, Rectal, Q6H PRN, Shela Leff, MD   amitriptyline (ELAVIL) tablet 50 mg, 50 mg, Oral, QHS, Rathore, Wandra Feinstein, MD, 50 mg at 02/14/22 2123   carvedilol (COREG) tablet 3.125 mg, 3.125 mg, Oral, BID WC, Lala Lund K, MD, 3.125 mg at 02/14/22 1724   cefTRIAXone (ROCEPHIN) 1 g in sodium chloride 0.9 % 100 mL IVPB, 1 g, Intravenous, Q24H, Pokhrel, Laxman, MD, Last Rate: 200 mL/hr at 02/14/22 1551, Infusion Verify at 02/14/22 1551   Chlorhexidine Gluconate Cloth 2 % PADS 6 each, 6 each, Topical, Daily, Shela Leff, MD, 6 each at 02/15/22 1107   enoxaparin (LOVENOX) injection 50 mg, 50 mg, Subcutaneous, BID, Shela Leff, MD, 50 mg at 02/15/22 7673   gabapentin (NEURONTIN) capsule 100 mg, 100 mg, Oral, BID, Shela Leff, MD, 100 mg at 02/15/22 4193   haloperidol lactate (HALDOL) injection 2 mg, 2 mg, Intramuscular, Q6H PRN, Thurnell Lose, MD, 2 mg at 02/13/22 1145   hydrALAZINE (APRESOLINE) injection 10 mg, 10 mg, Intravenous, Q6H PRN, Thurnell Lose, MD   midodrine (PROAMATINE) tablet 2.5 mg, 2.5 mg, Oral, TID WC, Shela Leff, MD, 2.5 mg at 02/15/22 1207   Oral care mouth rinse, 15 mL, Mouth Rinse, PRN,  Shela Leff, MD   Oral care mouth rinse, 15 mL, Mouth Rinse, 4 times per day, Shela Leff, MD, 15 mL at 02/15/22 1208   Oral care mouth rinse, 15 mL, Mouth Rinse, PRN, Shela Leff, MD   risperiDONE (RISPERDAL) tablet 3 mg, 3 mg, Oral, QHS, Rathore, Wandra Feinstein, MD, 3 mg at 02/14/22 2122   valproate (DEPACON) 250 mg in dextrose 5 % 50 mL IVPB, 250 mg, Intravenous, Q8H, Starkes-Perry, Takia S, FNP, Last Rate: 52.5 mL/hr at 02/15/22 0527, 250 mg at 02/15/22 7902 Medication Side Effects: none  Family Medical/ Social History: Changes? No  MENTAL HEALTH EXAM:  There were no vitals taken for this visit.There is no height or weight on file to calculate BMI.  General Appearance:  unable to assess  Eye Contact:   unable to assess  Speech:  Clear and Coherent and Normal Rate  Volume:  Increased  Mood:  Angry, Anxious, and Irritable  Affect:   unable to see her but she is labile, angry  Thought Process:  Goal Directed and Descriptions of Associations: Circumstantial  Orientation:  Full (Time, Place, and Person)  Thought Content: Illogical and Obsessions   Suicidal Thoughts:  No  Homicidal Thoughts:  No  Memory:   Fair  Judgement:  Poor  Insight:  Lacking  Psychomotor Activity:   unable to assess  Concentration:  Concentration: Good  Recall:  [employer] of Knowledge: Fair  Language: Fair  Assets:  Neurosurgeon  ADL's:  Intact  Cognition: WNL  Prognosis:  Poor   DIAGNOSES:    ICD-10-CM   1. Panic attacks  F41.0     2. Generalized anxiety disorder  F41.1     3. Bipolar I disorder ([place])  F31.9     4. Insomnia due to other mental disorder  F51.05    F99     5. Polypharmacy  Z79.899     6. Smoker  F17.200       Receiving Psychotherapy: No   RECOMMENDATIONS:  PDMP was reviewed. Last Xanax filled 12/21/2021.  Oxycodone filled 01/03/2022. I provided 30 minutes of non-face-to-face time during this encounter, including time  spent before and after the visit in records review, medical decision making, counseling pertinent to today's visit, and charting.   I explained to the patient and her daughter that patient reported at the last visit that she had been off Xanax for 6 days, from 12/15/2021 to 12/21/2021.  She denied withdrawals.  I gave her a lower dose of Xanax, enough for 5 days so that she could wean off if she indeed had been taking the Xanax.  It was not stopped cold [name] by me, she stopped it cold [name] by not having it for those 6 days.  I reminded her that any benzodiazepine can cause increased confusion, imbalance, and possibly falls.  There are other ways to treat anxiety besides using a benzodiazepine.  There is no reason to restart that with the risks involved at her age. Recommend gabapentin and increase hydroxyzine.  Benefits, risk and side effects were discussed and she accepts.  I stressed the importance of therapy and helping with anxiety and depression.    Continue Elavil 25 mg, 2 po qhs. Continue Depakote DR 250 mg, 1 po tid. Start gabapentin 100 mg, 1 p.o. twice daily for 4 days, then 1 p.o. 3 times daily. Increase hydroxyzine to 50 mg, 1 p.o. 3 times daily as needed anxiety. Continue Risperdal 3 mg, 1 p.o. nightly. Recommend multivitamin, B complex, vitamin D, fish oil. Strongly recommend counseling. Return in 4 weeks.  Donnal Moat, PA-C

## 2022-02-05 NOTE — Telephone Encounter (Signed)
        Patient  visited [facility] on 01/27/2022  for Infection and inflammatory reaction due to indwelling urethral catheter, initial encounter.   Telephone encounter attempt :  2nd  A HIPAA compliant voice message was left requesting a return call.  Instructed patient to call back at [phone].   [facility] Resource Care Guide   ??[email or web address]  ?? [phone]   Website: [email or web address]  [email or web address]

## 2022-02-06 ENCOUNTER — Inpatient Hospital Stay: Payer: Medicare Other

## 2022-02-06 ENCOUNTER — Telehealth: Payer: Self-pay

## 2022-02-06 LAB — CUP PACEART REMOTE DEVICE CHECK
Battery Impedance: 629 Ohm
Battery Remaining Longevity: 91 mo
Battery Voltage: 2.78 V
Brady Statistic AP VP Percent: 0 %
Brady Statistic AP VS Percent: 0 %
Brady Statistic AS VP Percent: 2 %
Brady Statistic AS VS Percent: 98 %
Date Time Interrogation Session: 20231119072942
Implantable Lead Connection Status: 753985
Implantable Lead Connection Status: 753985
Implantable Lead Implant Date: 20141205
Implantable Lead Implant Date: 20141205
Implantable Lead Location: 753859
Implantable Lead Location: 753860
Implantable Lead Model: 5076
Implantable Lead Model: 5076
Implantable Pulse Generator Implant Date: 20141205
Lead Channel Impedance Value: 441 Ohm
Lead Channel Impedance Value: 459 Ohm
Lead Channel Pacing Threshold Amplitude: 0.5 V
Lead Channel Pacing Threshold Amplitude: 1.125 V
Lead Channel Pacing Threshold Pulse Width: 0.4 ms
Lead Channel Pacing Threshold Pulse Width: 0.4 ms
Lead Channel Setting Pacing Amplitude: 2 V
Lead Channel Setting Pacing Amplitude: 2.5 V
Lead Channel Setting Pacing Pulse Width: 0.4 ms
Lead Channel Setting Sensing Sensitivity: 2 mV
Zone Setting Status: 755011
Zone Setting Status: 755011

## 2022-02-06 NOTE — Telephone Encounter (Signed)
     Patient  visit on 01/27/2022  at [facility] was for Infection and inflammatory reaction due to indwelling urethral catheter, initial encounter.  Have you been able to follow up with your primary care physician? No  The patient was or was not able to obtain any needed medicine or equipment. Patient was able to obtain medication.  Are there diet recommendations that you are having difficulty following? No  Patient expresses understanding of discharge instructions and education provided has no other needs at this time.    [facility] Resource Care Guide   ??[email or web address]  ?? [phone]   Website: [email or web address]  [email or web address]

## 2022-02-11 ENCOUNTER — Encounter: Payer: Self-pay | Admitting: Physician Assistant

## 2022-02-11 ENCOUNTER — Emergency Department (HOSPITAL_COMMUNITY)
Admission: EM | Admit: 2022-02-11 | Discharge: 2022-02-11 | Disposition: A | Discharge status: Home / Self Care | Payer: Medicare Other | Source: Home / Self Care | Attending: Emergency Medicine | Admitting: Emergency Medicine

## 2022-02-11 ENCOUNTER — Emergency Department (HOSPITAL_COMMUNITY): Payer: Medicare Other

## 2022-02-11 ENCOUNTER — Other Ambulatory Visit: Payer: Self-pay

## 2022-02-11 ENCOUNTER — Encounter (HOSPITAL_COMMUNITY): Payer: Self-pay | Admitting: Emergency Medicine

## 2022-02-11 DIAGNOSIS — E876 Hypokalemia: Secondary | ICD-10-CM | POA: Insufficient documentation

## 2022-02-11 DIAGNOSIS — F418 Other specified anxiety disorders: Secondary | ICD-10-CM | POA: Diagnosis not present

## 2022-02-11 DIAGNOSIS — G319 Degenerative disease of nervous system, unspecified: Secondary | ICD-10-CM | POA: Diagnosis not present

## 2022-02-11 DIAGNOSIS — M5416 Radiculopathy, lumbar region: Secondary | ICD-10-CM | POA: Diagnosis not present

## 2022-02-11 DIAGNOSIS — F319 Bipolar disorder, unspecified: Secondary | ICD-10-CM | POA: Diagnosis not present

## 2022-02-11 DIAGNOSIS — F17210 Nicotine dependence, cigarettes, uncomplicated: Secondary | ICD-10-CM | POA: Diagnosis not present

## 2022-02-11 DIAGNOSIS — I071 Rheumatic tricuspid insufficiency: Secondary | ICD-10-CM | POA: Diagnosis not present

## 2022-02-11 DIAGNOSIS — I959 Hypotension, unspecified: Secondary | ICD-10-CM | POA: Diagnosis not present

## 2022-02-11 DIAGNOSIS — Z743 Need for continuous supervision: Secondary | ICD-10-CM | POA: Diagnosis not present

## 2022-02-11 DIAGNOSIS — E785 Hyperlipidemia, unspecified: Secondary | ICD-10-CM | POA: Diagnosis not present

## 2022-02-11 DIAGNOSIS — R404 Transient alteration of awareness: Secondary | ICD-10-CM | POA: Diagnosis not present

## 2022-02-11 DIAGNOSIS — M2578 Osteophyte, vertebrae: Secondary | ICD-10-CM | POA: Diagnosis not present

## 2022-02-11 DIAGNOSIS — R000 Tachycardia, unspecified: Secondary | ICD-10-CM | POA: Insufficient documentation

## 2022-02-11 DIAGNOSIS — Z86718 Personal history of other venous thrombosis and embolism: Secondary | ICD-10-CM | POA: Diagnosis not present

## 2022-02-11 DIAGNOSIS — T699XXA Effect of reduced temperature, unspecified, initial encounter: Secondary | ICD-10-CM | POA: Diagnosis not present

## 2022-02-11 DIAGNOSIS — R55 Syncope and collapse: Secondary | ICD-10-CM | POA: Diagnosis not present

## 2022-02-11 DIAGNOSIS — G08 Intracranial and intraspinal phlebitis and thrombophlebitis: Secondary | ICD-10-CM | POA: Diagnosis not present

## 2022-02-11 DIAGNOSIS — Z87440 Personal history of urinary (tract) infections: Secondary | ICD-10-CM | POA: Diagnosis not present

## 2022-02-11 DIAGNOSIS — N390 Urinary tract infection, site not specified: Secondary | ICD-10-CM | POA: Diagnosis not present

## 2022-02-11 DIAGNOSIS — M47812 Spondylosis without myelopathy or radiculopathy, cervical region: Secondary | ICD-10-CM | POA: Diagnosis not present

## 2022-02-11 DIAGNOSIS — Z981 Arthrodesis status: Secondary | ICD-10-CM | POA: Diagnosis not present

## 2022-02-11 DIAGNOSIS — Z950 Presence of cardiac pacemaker: Secondary | ICD-10-CM | POA: Insufficient documentation

## 2022-02-11 DIAGNOSIS — D689 Coagulation defect, unspecified: Secondary | ICD-10-CM | POA: Diagnosis not present

## 2022-02-11 DIAGNOSIS — Z86711 Personal history of pulmonary embolism: Secondary | ICD-10-CM | POA: Diagnosis not present

## 2022-02-11 DIAGNOSIS — I951 Orthostatic hypotension: Secondary | ICD-10-CM | POA: Insufficient documentation

## 2022-02-11 DIAGNOSIS — R296 Repeated falls: Secondary | ICD-10-CM | POA: Diagnosis not present

## 2022-02-11 DIAGNOSIS — Z8249 Family history of ischemic heart disease and other diseases of the circulatory system: Secondary | ICD-10-CM | POA: Diagnosis not present

## 2022-02-11 DIAGNOSIS — S0003XA Contusion of scalp, initial encounter: Secondary | ICD-10-CM | POA: Diagnosis not present

## 2022-02-11 DIAGNOSIS — R0689 Other abnormalities of breathing: Secondary | ICD-10-CM | POA: Diagnosis not present

## 2022-02-11 DIAGNOSIS — R42 Dizziness and giddiness: Secondary | ICD-10-CM | POA: Insufficient documentation

## 2022-02-11 DIAGNOSIS — R1030 Lower abdominal pain, unspecified: Secondary | ICD-10-CM | POA: Diagnosis not present

## 2022-02-11 DIAGNOSIS — G9389 Other specified disorders of brain: Secondary | ICD-10-CM | POA: Diagnosis not present

## 2022-02-11 DIAGNOSIS — R350 Frequency of micturition: Secondary | ICD-10-CM | POA: Diagnosis not present

## 2022-02-11 DIAGNOSIS — M549 Dorsalgia, unspecified: Secondary | ICD-10-CM | POA: Diagnosis not present

## 2022-02-11 DIAGNOSIS — Z9181 History of falling: Secondary | ICD-10-CM | POA: Diagnosis not present

## 2022-02-11 DIAGNOSIS — I499 Cardiac arrhythmia, unspecified: Secondary | ICD-10-CM | POA: Diagnosis not present

## 2022-02-11 DIAGNOSIS — S0990XA Unspecified injury of head, initial encounter: Secondary | ICD-10-CM | POA: Diagnosis not present

## 2022-02-11 DIAGNOSIS — R6889 Other general symptoms and signs: Secondary | ICD-10-CM | POA: Diagnosis not present

## 2022-02-11 LAB — PHOSPHORUS: Phosphorus: 3.1 mg/dL (ref 2.5–4.6)

## 2022-02-11 LAB — COMPREHENSIVE METABOLIC PANEL
ALT: 10 U/L (ref 0–44)
AST: 15 U/L (ref 15–41)
Albumin: 3.5 g/dL (ref 3.5–5.0)
Alkaline Phosphatase: 60 U/L (ref 38–126)
Anion gap: 11 (ref 5–15)
BUN: 10 mg/dL (ref 8–23)
CO2: 25 mmol/L (ref 22–32)
Calcium: 8.8 mg/dL — ABNORMAL LOW (ref 8.9–10.3)
Chloride: 107 mmol/L (ref 98–111)
Creatinine, Ser: 0.82 mg/dL (ref 0.44–1.00)
GFR, Estimated: >60 mL/min (ref >60)
Glucose, Bld: 125 mg/dL — ABNORMAL HIGH (ref 70–99)
Potassium: 3.3 mmol/L — ABNORMAL LOW (ref 3.5–5.1)
Sodium: 143 mmol/L (ref 135–145)
Total Bilirubin: 0.6 mg/dL (ref 0.3–1.2)
Total Protein: 5.8 g/dL — ABNORMAL LOW (ref 6.5–8.1)

## 2022-02-11 LAB — CBC WITH DIFFERENTIAL/PLATELET
Abs Immature Granulocytes: 0.02 10*3/uL (ref 0.00–0.07)
Basophils Absolute: 0.1 10*3/uL (ref 0.0–0.1)
Basophils Relative: 1 %
Eosinophils Absolute: 0.1 10*3/uL (ref 0.0–0.5)
Eosinophils Relative: 1 %
HCT: 40 % (ref 36.0–46.0)
Hemoglobin: 12.8 g/dL (ref 12.0–15.0)
Immature Granulocytes: 0 %
Lymphocytes Relative: 20 %
Lymphs Abs: 1.4 10*3/uL (ref 0.7–4.0)
MCH: 29.4 pg (ref 26.0–34.0)
MCHC: 32 g/dL (ref 30.0–36.0)
MCV: 92 fL (ref 80.0–100.0)
Monocytes Absolute: 0.4 10*3/uL (ref 0.1–1.0)
Monocytes Relative: 6 %
Neutro Abs: 5 10*3/uL (ref 1.7–7.7)
Neutrophils Relative %: 72 %
Platelets: 259 10*3/uL (ref 150–400)
RBC: 4.35 MIL/uL (ref 3.87–5.11)
RDW: 13.5 % (ref 11.5–15.5)
WBC: 6.9 10*3/uL (ref 4.0–10.5)
nRBC: 0 % (ref 0.0–0.2)

## 2022-02-11 LAB — MAGNESIUM: Magnesium: 2 mg/dL (ref 1.7–2.4)

## 2022-02-11 MED ORDER — POTASSIUM CHLORIDE CRYS ER 20 MEQ PO TBCR
40.0000 meq | EXTENDED_RELEASE_TABLET | Freq: Once | ORAL | Status: AC
  Administered 2022-02-11: 40 meq via ORAL
  Filled 2022-02-11: qty 2

## 2022-02-11 MED ORDER — LACTATED RINGERS IV BOLUS
1000.0000 mL | Freq: Once | INTRAVENOUS | Status: AC
  Administered 2022-02-11: 1000 mL via INTRAVENOUS

## 2022-02-11 NOTE — ED Notes (Signed)
ED Provider at bedside to discuss Orthostatic VS with patient and plan of care

## 2022-02-11 NOTE — ED Provider Notes (Signed)
[facility] DEPART[facility] Provider Note   CSN: [id number] Arrival date & time: 02/11/22  1529     History  Chief Complaint  Patient presents with   Hypotension    [name] is a 66 y.o. female.  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  The patient states that for the last day [name] has been feeling lightheaded and dizzy upon standing.  [name] states [name] feels like [name] is about to pass out and has fallen twice in the last day due to her lightheadedness.  [name] states [name] is unsure if [name] hit her head but does not believe [name] lost consciousness.  [name] states that [name] has been nauseous but denies any vomiting.  [name] denies any fevers or chills.  [name] states that [name] is currently being treated for UTI but reports her UTI symptoms have improved.  [name] denies being on any blood pressure medication and denies any diarrhea.  [name] denies any black or bloody stools.  [name] has been seen in the emergency department several times over the last month for dizziness but states that today the dizziness seem to occur more rapidly upon standing compared to prior this month.  Of note when [name] had orthostatic vitals performed by EMS that [name] did become tachycardic and hypotensive upon position change.  The history is provided by the patient and the EMS personnel.       Home Medications Prior to Admission medications   Medication Sig Start Date End Date Taking? Authorizing Provider  ALPRAZolam Duanne Moron) 0.5 MG tablet Take 1 tablet (0.5 mg total) by mouth 2 (two) times daily as needed for anxiety. Patient not taking: Reported on 02/05/2022 12/21/21   Donnal Moat T, PA-C  amitriptyline (ELAVIL) 25 MG tablet TAKE 2 TABLETS BY MOUTH AT BEDTIME. 11/27/21   Donnal Moat T, PA-C  divalproex (DEPAKOTE) 250 MG DR tablet Take 1 tablet (250 mg total) by mouth in the  morning, at noon, and at bedtime. 12/21/21   Donnal Moat T, PA-C  enoxaparin (LOVENOX) 60 MG/0.6ML injection Inject 0.5 mLs (50 mg total) into the skin 2 (two) times daily. 12/20/21 04/19/22  Dede Query T, PA-C  gabapentin (NEURONTIN) 100 MG capsule Take 1 capsule (100 mg total) by mouth 2 (two) times daily. 1 po bid for 4 days, then 1 po tid. 02/05/22   Addison Lank, PA-C  hydrOXYzine (ATARAX) 50 MG tablet Take 1 tablet (50 mg total) by mouth 3 (three) times daily as needed. 02/05/22   Addison Lank, PA-C  lactulose (CHRONULAC) 10 GM/15ML solution Take 15 mLs (10 g total) by mouth 2 (two) times daily as needed for mild constipation. Titrate for 2-3 BM daily. Hold if loose stool. Patient not taking: Reported on 02/05/2022 11/25/21   Antonieta Pert, MD  levofloxacin (LEVAQUIN) 500 MG tablet Take 1 tablet (500 mg total) by mouth daily. 02/04/22   Lajean Saver, MD  nitrofurantoin, macrocrystal-monohydrate, (MACROBID) 100 MG capsule Take 100 mg by mouth every 12 (twelve) hours. Patient not taking: Reported on 02/05/2022 01/31/22   [provider]  risperiDONE (RISPERDAL) 3 MG tablet Take 1 tablet (3 mg total) by mouth at bedtime. 12/21/21   Donnal Moat T, PA-C  tiZANidine (ZANAFLEX) 4 MG tablet Take 4 mg by mouth every 8 (eight) hours as needed for muscle spasms.    [provider]  Allergies    Antibiotic ear [neomycin-polymyxin-hc] and Penicillins    Review of Systems   Review of Systems  Physical Exam Updated Vital Signs BP (!) 142/91   Pulse 82   Temp 98.5 F (36.9 C) (Oral)   Resp 16   Ht '5\' 7"'$  (1.702 m)   Wt 47.6 kg   SpO2 99%   BMI 16.45 kg/m  Physical Exam Vitals and nursing note reviewed.  Constitutional:      General: [name] is not in acute distress.    Comments: Frail appearing  HENT:     Head: Normocephalic and atraumatic.     Nose: Nose normal.     Mouth/Throat:     Mouth: Mucous membranes are moist.     Pharynx: Oropharynx is clear.  Eyes:      Extraocular Movements: Extraocular movements intact.     Conjunctiva/sclera: Conjunctivae normal.     Pupils: Pupils are equal, round, and reactive to light.  Neck:     Comments: No midline neck tenderness Cardiovascular:     Rate and Rhythm: Normal rate and regular rhythm.     Pulses: Normal pulses.     Heart sounds: Normal heart sounds.  Pulmonary:     Effort: Pulmonary effort is normal.     Breath sounds: Normal breath sounds.  Abdominal:     General: Abdomen is flat.     Palpations: Abdomen is soft.     Tenderness: There is no abdominal tenderness.  Musculoskeletal:        General: Normal range of motion.     Cervical back: Normal range of motion and neck supple.     Right lower leg: No edema.     Left lower leg: No edema.  Skin:    General: Skin is warm and dry.  Neurological:     General: No focal deficit present.     Mental Status: [name] is alert and oriented to person, place, and time.     Cranial Nerves: No cranial nerve deficit.     Sensory: No sensory deficit.     Motor: No weakness.  Psychiatric:        Mood and Affect: Mood normal.        Behavior: Behavior normal.     ED Results / Procedures / Treatments   Labs (all labs ordered are listed, but only abnormal results are displayed) Labs Reviewed  COMPREHENSIVE METABOLIC PANEL - Abnormal; Notable for the following components:      Result Value   Potassium 3.3 (*)    Glucose, Bld 125 (*)    Calcium 8.8 (*)    Total Protein 5.8 (*)    All other components within normal limits  CBC WITH DIFFERENTIAL/PLATELET  MAGNESIUM  PHOSPHORUS    EKG EKG Interpretation  Date/Time:  Sunday February 11 2022 15:41:52 EST Ventricular Rate:  98 PR Interval:  139 QRS Duration: 73 QT Interval:  366 QTC Calculation: 468 R Axis:   52 Text Interpretation: Sinus rhythm LAE, consider biatrial enlargement No significant change since last tracing Confirmed by Leanord Asal ([id number]) on 02/11/2022 3:57:35 PM  Radiology CT  Head Wo Contrast  Result Date: 02/11/2022 CLINICAL DATA:  Head trauma, coagulopathy (Age 65-64y) EXAM: CT HEAD WITHOUT CONTRAST TECHNIQUE: Contiguous axial images were obtained from the base of the skull through the vertex without intravenous contrast. RADIATION DOSE REDUCTION: This exam was performed according to the departmental dose-optimization program which includes automated exposure control, adjustment of the mA and/or kV according to  patient size and/or use of iterative reconstruction technique. COMPARISON:  CT head 11/21/2021 FINDINGS: Brain: Cerebral ventricle sizes are concordant with the degree of cerebral volume loss. No evidence of large-territorial acute infarction. No parenchymal hemorrhage. No mass lesion. No extra-axial collection. No mass effect or midline shift. No hydrocephalus. Basilar cisterns are patent. Vascular: No hyperdense vessel. Skull: No acute fracture or focal lesion. Sinuses/Orbits: Paranasal sinuses and mastoid air cells are clear. Bilateral lens replacement. Otherwise the orbits are unremarkable. Other: None. IMPRESSION: No acute intracranial abnormality. Electronically Signed   By: Iven Finn M.D.   On: 02/11/2022 17:19    Procedures Procedures    Medications Ordered in ED Medications  lactated ringers bolus 1,000 mL (0 mLs Intravenous Stopped 02/11/22 1945)  potassium chloride SA (KLOR-CON M) CR tablet 40 mEq (40 mEq Oral Given 02/11/22 1716)    ED Course/ Medical Decision Making/ A&P Clinical Course as of 02/11/22 2008  Sun Feb 11, 2022  1752 Upon reassessment, the patient's work-up showed mild hypokalemia though improving from her prior.  [name] will be given potassium repletion.  CT head is negative.  [name] has approximately 500 cc of fluid remaining in her bolus and will have orthostatic vital signs performed after completion of her bolus. [VK]  2000 After fluid resuscitation, the patient's orthostatic vital signs remained positive however the patient  reports that [name] was asymptomatic during this.  Using shared decision making with the patient, I offered admission for further work-up of her orthostatic hypotension however [name] states that [name] would prefer outpatient work-up with her improvement of symptoms.  [name] will be prescribed compression stockings and was recommended to call her primary doctor within 24 hours to schedule a follow-up appointment.  [name] was given strict return precautions. 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 The complaint involves an extensive differential diagnosis and also carries with it a high risk of complications and morbidity.    The differential diagnosis includes orthostatic hypotension, dehydration, electrolyte abnormality, arrhythmia, anemia, ICH or mass effect from her fall on blood thinners, no other signs of trauma on exam, sepsis unlikely as [name] is afebrile with normal vital signs on arrival here  Additional history obtained: Additional history obtained from EMS  Records reviewed previous ED records  ED Course and Reassessment: Upon patient's arrival to the emergency department [name] is hemodynamically stable here.  [name] did have positive orthostatics for medics and will be given an IV fluid bolus.  Labs will be performed to evaluate for anemia or electrolyte abnormality.  EKG on arrival showed normal sinus rhythm.  [name] recently had her pacemaker interrogated during her visit 1 week ago that had no abnormalities.  CT head will be performed due to her fall on blood thinners.  Independent labs interpretation:  The following labs were independently interpreted: Mild hypokalemia otherwise within normal range  Independent  visualization of imaging: - I independently visualized the following imaging with scope of interpretation limited to determining acute life threatening conditions related to emergency care: CT head , which revealed no acute disease  Consultation: - Consulted or discussed management/test interpretation w/ external professional: N/A  Consideration for admission or further workup: Patient offered admission for further work-up however using shared decision making, [name] prefers discharge home with outpatient follow-up.  [name] was given strict return precautions Social Determinants of health: N/A    Amount and/or Complexity of Data Reviewed Labs: ordered. Radiology: ordered.  Risk Prescription drug management.          Final Clinical Impression(s) / ED Diagnoses Final diagnoses:  Orthostatic hypotension  Hypokalemia    Rx / DC Orders ED Discharge Orders          Ordered    Compression stockings        02/11/22 2006              Kemper Durie, DO 02/11/22 2008

## 2022-02-11 NOTE — Discharge Instructions (Signed)
You were seen in the emergency department for your dizziness.  Your blood pressure is dropping when you are going from sitting to standing which is causing you to feel dizzy.  We gave you some fluids with improvement of your symptoms however your blood pressure is still dropping with position change.  Your labs showed that your potassium level was mildly low but were otherwise normal and you had no signs of injuries from your falls.  I have given you a prescription for compression stockings and you should wear these daily to help improve the blood flow on position change.  You should also make sure that you sit at the edge of your bed or your chair for at least 30 seconds before standing up to give your body time to have your blood pressure equilibrate.  You should follow-up with your primary doctor within the next 24 hours to have reassessment and further evaluation.  You should return to the emergency department if you pass out, you have chest pain or if you have any other new or concerning symptoms.

## 2022-02-11 NOTE — ED Triage Notes (Signed)
Pt BIB [facility], over past 2-3 days any time she tries to do anything at home she gets lightheaded and passes out/falls, pt endorses 3-4 falls today. EMS orthostatic vitals- laying- 95/65, weak radial 110, sitting up HR 130 BP 80/60. Being treated for UTI and several clots. No lovenox x3 days. Feels normal when laying. C/o nausea. Given '4mg'$  zofran, 525m NS. Last 119/78, CBG 258 (no hx DM). Denies V/D. 20g L arm.

## 2022-02-12 ENCOUNTER — Inpatient Hospital Stay (HOSPITAL_COMMUNITY)
Admission: EM | Admit: 2022-02-12 | Discharge: 2022-02-23 | DRG: 312 | Disposition: A | Discharge status: Skilled Nursing Facility | Payer: Medicare Other | Attending: Family Medicine | Admitting: Family Medicine

## 2022-02-12 ENCOUNTER — Emergency Department (HOSPITAL_COMMUNITY): Payer: Medicare Other

## 2022-02-12 ENCOUNTER — Encounter: Payer: Self-pay | Admitting: Neurology

## 2022-02-12 ENCOUNTER — Other Ambulatory Visit: Payer: Self-pay

## 2022-02-12 ENCOUNTER — Encounter (HOSPITAL_COMMUNITY): Payer: Self-pay

## 2022-02-12 DIAGNOSIS — F319 Bipolar disorder, unspecified: Secondary | ICD-10-CM | POA: Diagnosis present

## 2022-02-12 DIAGNOSIS — R1030 Lower abdominal pain, unspecified: Secondary | ICD-10-CM | POA: Insufficient documentation

## 2022-02-12 DIAGNOSIS — R296 Repeated falls: Secondary | ICD-10-CM | POA: Diagnosis present

## 2022-02-12 DIAGNOSIS — F418 Other specified anxiety disorders: Secondary | ICD-10-CM | POA: Diagnosis present

## 2022-02-12 DIAGNOSIS — E785 Hyperlipidemia, unspecified: Secondary | ICD-10-CM | POA: Diagnosis present

## 2022-02-12 DIAGNOSIS — Z8541 Personal history of malignant neoplasm of cervix uteri: Secondary | ICD-10-CM

## 2022-02-12 DIAGNOSIS — Z86711 Personal history of pulmonary embolism: Secondary | ICD-10-CM

## 2022-02-12 DIAGNOSIS — S0003XA Contusion of scalp, initial encounter: Secondary | ICD-10-CM | POA: Diagnosis present

## 2022-02-12 DIAGNOSIS — Y92009 Unspecified place in unspecified non-institutional (private) residence as the place of occurrence of the external cause: Secondary | ICD-10-CM

## 2022-02-12 DIAGNOSIS — F17210 Nicotine dependence, cigarettes, uncomplicated: Secondary | ICD-10-CM | POA: Diagnosis present

## 2022-02-12 DIAGNOSIS — N390 Urinary tract infection, site not specified: Secondary | ICD-10-CM | POA: Diagnosis present

## 2022-02-12 DIAGNOSIS — R55 Syncope and collapse: Secondary | ICD-10-CM | POA: Diagnosis present

## 2022-02-12 DIAGNOSIS — E876 Hypokalemia: Secondary | ICD-10-CM | POA: Diagnosis present

## 2022-02-12 DIAGNOSIS — Z981 Arthrodesis status: Secondary | ICD-10-CM

## 2022-02-12 DIAGNOSIS — Z87440 Personal history of urinary (tract) infections: Secondary | ICD-10-CM

## 2022-02-12 DIAGNOSIS — M5416 Radiculopathy, lumbar region: Secondary | ICD-10-CM | POA: Diagnosis present

## 2022-02-12 DIAGNOSIS — G08 Intracranial and intraspinal phlebitis and thrombophlebitis: Secondary | ICD-10-CM

## 2022-02-12 DIAGNOSIS — F419 Anxiety disorder, unspecified: Secondary | ICD-10-CM | POA: Diagnosis present

## 2022-02-12 DIAGNOSIS — I951 Orthostatic hypotension: Principal | ICD-10-CM | POA: Diagnosis present

## 2022-02-12 DIAGNOSIS — Z90711 Acquired absence of uterus with remaining cervical stump: Secondary | ICD-10-CM

## 2022-02-12 DIAGNOSIS — Z950 Presence of cardiac pacemaker: Secondary | ICD-10-CM

## 2022-02-12 DIAGNOSIS — I071 Rheumatic tricuspid insufficiency: Secondary | ICD-10-CM | POA: Diagnosis present

## 2022-02-12 DIAGNOSIS — R000 Tachycardia, unspecified: Secondary | ICD-10-CM | POA: Diagnosis present

## 2022-02-12 DIAGNOSIS — F22 Delusional disorders: Secondary | ICD-10-CM | POA: Diagnosis present

## 2022-02-12 DIAGNOSIS — R350 Frequency of micturition: Secondary | ICD-10-CM | POA: Diagnosis present

## 2022-02-12 DIAGNOSIS — G40909 Epilepsy, unspecified, not intractable, without status epilepticus: Secondary | ICD-10-CM | POA: Diagnosis present

## 2022-02-12 DIAGNOSIS — Z7901 Long term (current) use of anticoagulants: Secondary | ICD-10-CM

## 2022-02-12 DIAGNOSIS — E860 Dehydration: Secondary | ICD-10-CM | POA: Diagnosis present

## 2022-02-12 DIAGNOSIS — Z8249 Family history of ischemic heart disease and other diseases of the circulatory system: Secondary | ICD-10-CM

## 2022-02-12 DIAGNOSIS — Z9181 History of falling: Secondary | ICD-10-CM

## 2022-02-12 DIAGNOSIS — Z86718 Personal history of other venous thrombosis and embolism: Secondary | ICD-10-CM

## 2022-02-12 DIAGNOSIS — W19XXXA Unspecified fall, initial encounter: Principal | ICD-10-CM | POA: Diagnosis present

## 2022-02-12 MED ORDER — ONDANSETRON HCL 4 MG/2ML IJ SOLN
4.0000 mg | Freq: Once | INTRAMUSCULAR | Status: AC
  Administered 2022-02-12: 4 mg via INTRAVENOUS
  Filled 2022-02-12: qty 2
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
## 2022-02-12 NOTE — ED Provider Notes (Signed)
[facility] EMERGENCY DEPARTMENT Provider Note   CSN: [id number] Arrival date & time: 02/12/22  2313     History {Add pertinent medical, surgical, social history, OB history to HPI:1} Chief Complaint  Patient presents with   Fall    On Lovenox     [name] is a 66 y.o. female.  The history is provided by the patient.  Fall  She has history of hyperlipidemia, syncope, cervical cancer, bipolar disorder, cardiac pacemaker and comes in following a fall at home.  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  She states that she got out of bed to walk to the bathroom and it felt like she was on a boat and she then fell.  This is happened twice before.  She apparently hit the back of her head.  She is complaining of pain in the back of her head and some nausea but denies other injury.   Home Medications Prior to Admission medications   Medication Sig Start Date End Date Taking? Authorizing Provider  ALPRAZolam Duanne Moron) 0.5 MG tablet Take 1 tablet (0.5 mg total) by mouth 2 (two) times daily as needed for anxiety. Patient not taking: Reported on 02/05/2022 12/21/21   Donnal Moat T, PA-C  amitriptyline (ELAVIL) 25 MG tablet TAKE 2 TABLETS BY MOUTH AT BEDTIME. 11/27/21   Donnal Moat T, PA-C  divalproex (DEPAKOTE) 250 MG DR tablet Take 1 tablet (250 mg total) by mouth in the morning, at noon, and at bedtime. 12/21/21   Donnal Moat T, PA-C  enoxaparin (LOVENOX) 60 MG/0.6ML injection Inject 0.5 mLs (50 mg total) into the skin 2 (two) times daily. 12/20/21 04/19/22  Dede Query T, PA-C  gabapentin (NEURONTIN) 100 MG capsule Take 1 capsule (100 mg total) by mouth 2 (two) times daily. 1 po bid for 4 days, then 1 po tid. 02/05/22   Addison Lank, PA-C  hydrOXYzine (ATARAX) 50 MG tablet Take 1 tablet (50 mg total) by mouth 3 (three) times daily as needed. 02/05/22   Addison Lank, PA-C  lactulose (CHRONULAC) 10 GM/15ML solution Take 15 mLs (10 g total) by mouth 2 (two) times  daily as needed for mild constipation. Titrate for 2-3 BM daily. Hold if loose stool. Patient not taking: Reported on 02/05/2022 11/25/21   Antonieta Pert, MD  levofloxacin (LEVAQUIN) 500 MG tablet Take 1 tablet (500 mg total) by mouth daily. 02/04/22   Lajean Saver, MD  nitrofurantoin, macrocrystal-monohydrate, (MACROBID) 100 MG capsule Take 100 mg by mouth every 12 (twelve) hours. Patient not taking: Reported on 02/05/2022 01/31/22   [provider]  risperiDONE (RISPERDAL) 3 MG tablet Take 1 tablet (3 mg total) by mouth at bedtime. 12/21/21   Donnal Moat T, PA-C  tiZANidine (ZANAFLEX) 4 MG tablet Take 4 mg by mouth every 8 (eight) hours as needed for muscle spasms.    [provider]      Allergies    Antibiotic ear [neomycin-polymyxin-hc] and Penicillins    Review of Systems   Review of Systems  All other systems reviewed and are negative.   Physical Exam Updated Vital Signs BP (!) 164/111 (BP Location: Left Arm)   Pulse 95   Temp 97.7 F (36.5 C) (Temporal)   Resp 15   SpO2 100%  Physical Exam Vitals and nursing note reviewed.   66 year old female, resting comfortably and in no acute distress. Vital signs are significant for elevated blood pressure. Oxygen saturation is 100%, which is normal. Head is  normocephalic.  There is tenderness over the occiput without any cephalhematoma noted. PERRLA, EOMI. Oropharynx is clear. Neck is immobilized in a stiff cervical collar and is nontender. Back is nontender and there is no CVA tenderness. Lungs are clear without rales, wheezes, or rhonchi. Chest is nontender. Heart has regular rate and rhythm without murmur. Abdomen is soft, flat, nontender. Pelvis is stable and nontender. Extremities have no cyanosis or edema, full range of motion is present. Skin is warm and dry without rash. Neurologic: Mental status is normal, cranial nerves are intact, moves all extremities equally.  ED Results / Procedures / Treatments    Labs (all labs ordered are listed, but only abnormal results are displayed) Labs Reviewed  BASIC METABOLIC PANEL  CBC WITH DIFFERENTIAL/PLATELET    EKG None  Radiology CT Head Wo Contrast  Result Date: 02/11/2022 CLINICAL DATA:  Head trauma, coagulopathy (Age 21-64y) EXAM: CT HEAD WITHOUT CONTRAST TECHNIQUE: Contiguous axial images were obtained from the base of the skull through the vertex without intravenous contrast. RADIATION DOSE REDUCTION: This exam was performed according to the departmental dose-optimization program which includes automated exposure control, adjustment of the mA and/or kV according to patient size and/or use of iterative reconstruction technique. COMPARISON:  CT head 11/21/2021 FINDINGS: Brain: Cerebral ventricle sizes are concordant with the degree of cerebral volume loss. No evidence of large-territorial acute infarction. No parenchymal hemorrhage. No mass lesion. No extra-axial collection. No mass effect or midline shift. No hydrocephalus. Basilar cisterns are patent. Vascular: No hyperdense vessel. Skull: No acute fracture or focal lesion. Sinuses/Orbits: Paranasal sinuses and mastoid air cells are clear. Bilateral lens replacement. Otherwise the orbits are unremarkable. Other: None. IMPRESSION: No acute intracranial abnormality. Electronically Signed   By: Iven Finn M.D.   On: 02/11/2022 17:19    Procedures Procedures  {Document cardiac monitor, telemetry assessment procedure when appropriate:1}  Medications Ordered in ED Medications  ondansetron (ZOFRAN) injection 4 mg (has no administration in time range)    ED Course/ Medical Decision Making/ A&P                           Medical Decision Making Amount and/or Complexity of Data Reviewed Labs: ordered. Radiology: ordered.  Risk Prescription drug management.   Head injury and patient who is on anticoagulants.  Level 2 trauma is activated.  I have ordered CBC, basic metabolic panel,  electrocardiogram, CT of head and cervical spine.  I have reviewed her old records, and she was seen in the emergency department yesterday for orthostatic hypotension which was treated with lactated Ringer's solution, also noted to be hypokalemic and was given a dose of oral potassium.  She was also seen in the ED on 02/04/2022 for near syncope and was also noted to be hypokalemic and was also given a dose of oral potassium in the emergency department.  Also hypokalemic on 01/27/2022 when she was seen in the emergency department for urinary tract infection, and was given a single dose of intravenous potassium.  On review of her records, I do not see any diuretic prescriptions.    CRITICAL CARE Performed by: Delora Fuel Total critical care time: *** minutes Critical care time was exclusive of separately billable procedures and treating other patients. Critical care was necessary to treat or prevent imminent or life-threatening deterioration. Critical care was time spent personally by me on the following activities: development of treatment plan with patient and/or surrogate as well as nursing, discussions with consultants,  evaluation of patient's response to treatment, examination of patient, obtaining history from patient or surrogate, ordering and performing treatments and interventions, ordering and review of laboratory studies, ordering and review of radiographic studies, pulse oximetry and re-evaluation of patient's condition.  Final Clinical Impression(s) / ED Diagnoses Final diagnoses:  None    Rx / DC Orders ED Discharge Orders     None

## 2022-02-13 ENCOUNTER — Telehealth: Payer: Self-pay | Admitting: Neurology

## 2022-02-13 ENCOUNTER — Telehealth: Payer: Self-pay

## 2022-02-13 ENCOUNTER — Observation Stay (HOSPITAL_COMMUNITY): Payer: Medicare Other

## 2022-02-13 DIAGNOSIS — R55 Syncope and collapse: Secondary | ICD-10-CM | POA: Diagnosis not present

## 2022-02-13 DIAGNOSIS — I951 Orthostatic hypotension: Secondary | ICD-10-CM | POA: Diagnosis not present

## 2022-02-13 DIAGNOSIS — R1030 Lower abdominal pain, unspecified: Secondary | ICD-10-CM | POA: Insufficient documentation

## 2022-02-13 LAB — CBC WITH DIFFERENTIAL/PLATELET
Abs Immature Granulocytes: 0.02 10*3/uL (ref 0.00–0.07)
Basophils Absolute: 0.1 10*3/uL (ref 0.0–0.1)
Basophils Relative: 1 %
Eosinophils Absolute: 0.2 10*3/uL (ref 0.0–0.5)
Eosinophils Relative: 2 %
HCT: 40 % (ref 36.0–46.0)
Hemoglobin: 12.7 g/dL (ref 12.0–15.0)
Immature Granulocytes: 0 %
Lymphocytes Relative: 32 %
Lymphs Abs: 2.3 10*3/uL (ref 0.7–4.0)
MCH: 29.4 pg (ref 26.0–34.0)
MCHC: 31.8 g/dL (ref 30.0–36.0)
MCV: 92.6 fL (ref 80.0–100.0)
Monocytes Absolute: 0.7 10*3/uL (ref 0.1–1.0)
Monocytes Relative: 10 %
Neutro Abs: 4 10*3/uL (ref 1.7–7.7)
Neutrophils Relative %: 55 %
Platelets: 250 10*3/uL (ref 150–400)
RBC: 4.32 MIL/uL (ref 3.87–5.11)
RDW: 13.6 % (ref 11.5–15.5)
WBC: 7.3 10*3/uL (ref 4.0–10.5)
nRBC: 0 % (ref 0.0–0.2)

## 2022-02-13 LAB — ECHOCARDIOGRAM COMPLETE
Area-P 1/2: 10.25 cm2
Calc EF: 61 %
Height: 67 in
S' Lateral: 2.6 cm
Single Plane A2C EF: 67.7 %
Single Plane A4C EF: 51 %
Weight: 1920 oz

## 2022-02-13 LAB — BASIC METABOLIC PANEL
Anion gap: 10 (ref 5–15)
BUN: 10 mg/dL (ref 8–23)
CO2: 26 mmol/L (ref 22–32)
Calcium: 9.1 mg/dL (ref 8.9–10.3)
Chloride: 107 mmol/L (ref 98–111)
Creatinine, Ser: 0.87 mg/dL (ref 0.44–1.00)
GFR, Estimated: >60 mL/min (ref >60)
Glucose, Bld: 89 mg/dL (ref 70–99)
Potassium: 4 mmol/L (ref 3.5–5.1)
Sodium: 143 mmol/L (ref 135–145)

## 2022-02-13 LAB — URINALYSIS, ROUTINE W REFLEX MICROSCOPIC
Bilirubin Urine: NEGATIVE
Glucose, UA: NEGATIVE mg/dL
Ketones, ur: 5 mg/dL — AB
Nitrite: NEGATIVE
Protein, ur: NEGATIVE mg/dL
Specific Gravity, Urine: 1.01 (ref 1.005–1.030)
pH: 8 (ref 5.0–8.0)

## 2022-02-13 LAB — TSH: TSH: 1.26 u[IU]/mL (ref 0.350–4.500)

## 2022-02-13 LAB — CORTISOL: Cortisol, Plasma: 10.7 ug/dL

## 2022-02-13 LAB — VALPROIC ACID LEVEL: Valproic Acid Lvl: 34 ug/mL — ABNORMAL LOW (ref 50.0–100.0)

## 2022-02-13 LAB — MAGNESIUM: Magnesium: 2 mg/dL (ref 1.7–2.4)

## 2022-02-13 MED ORDER — RISPERIDONE 3 MG PO TABS
3.0000 mg | ORAL_TABLET | Freq: Every day | ORAL | Status: DC
  Administered 2022-02-13 – 2022-02-22 (×10): 3 mg via ORAL
  Filled 2022-02-13 (×12): qty 1

## 2022-02-13 MED ORDER — LORAZEPAM 2 MG/ML IJ SOLN: 1.0000 mg | Freq: Once | INTRAMUSCULAR | Status: AC

## 2022-02-13 MED ORDER — ACETAMINOPHEN 650 MG RE SUPP: 650.0000 mg | Freq: Four times a day (QID) | RECTAL | Status: DC | PRN

## 2022-02-13 MED ORDER — HYDRALAZINE HCL 20 MG/ML IJ SOLN: 10.0000 mg | Freq: Four times a day (QID) | INTRAMUSCULAR | Status: DC | PRN

## 2022-02-13 MED ORDER — VALPROATE SODIUM 100 MG/ML IV SOLN
250.0000 mg | Freq: Three times a day (TID) | INTRAVENOUS | Status: AC
  Administered 2022-02-13 – 2022-02-15 (×7): 250 mg via INTRAVENOUS
  Filled 2022-02-13 (×10): qty 2.5

## 2022-02-13 MED ORDER — SODIUM CHLORIDE 0.9 % IV SOLN: INTRAVENOUS | Status: DC

## 2022-02-13 MED ORDER — LORAZEPAM 2 MG/ML IJ SOLN
INTRAMUSCULAR | Status: AC
  Administered 2022-02-13: 1 mg via INTRAVENOUS
  Filled 2022-02-13: qty 1

## 2022-02-13 MED ORDER — AMITRIPTYLINE HCL 50 MG PO TABS
50.0000 mg | ORAL_TABLET | Freq: Every day | ORAL | Status: DC
  Administered 2022-02-13 – 2022-02-22 (×10): 50 mg via ORAL
  Filled 2022-02-13 (×9): qty 1
  Filled 2022-02-13: qty 2

## 2022-02-13 MED ORDER — CARVEDILOL 3.125 MG PO TABS
3.1250 mg | ORAL_TABLET | Freq: Two times a day (BID) | ORAL | Status: DC
  Administered 2022-02-13 – 2022-02-16 (×5): 3.125 mg via ORAL
  Filled 2022-02-13 (×5): qty 1

## 2022-02-13 MED ORDER — ENOXAPARIN SODIUM 60 MG/0.6ML IJ SOSY
50.0000 mg | PREFILLED_SYRINGE | Freq: Two times a day (BID) | INTRAMUSCULAR | Status: DC
  Administered 2022-02-13 – 2022-02-23 (×21): 50 mg via SUBCUTANEOUS
  Filled 2022-02-13 (×22): qty 0.6

## 2022-02-13 MED ORDER — SODIUM CHLORIDE 0.9 % IV BOLUS
1000.0000 mL | Freq: Once | INTRAVENOUS | Status: AC
  Administered 2022-02-13: 1000 mL via INTRAVENOUS

## 2022-02-13 MED ORDER — DIPHENHYDRAMINE HCL 50 MG/ML IJ SOLN: 25.0000 mg | Freq: Once | INTRAMUSCULAR | Status: AC

## 2022-02-13 MED ORDER — ACETAMINOPHEN 325 MG PO TABS
650.0000 mg | ORAL_TABLET | Freq: Four times a day (QID) | ORAL | Status: DC | PRN
  Administered 2022-02-15 – 2022-02-21 (×4): 650 mg via ORAL
  Filled 2022-02-13 (×6): qty 2

## 2022-02-13 MED ORDER — DIPHENHYDRAMINE HCL 50 MG/ML IJ SOLN
INTRAMUSCULAR | Status: AC
  Administered 2022-02-13: 25 mg via INTRAVENOUS
  Filled 2022-02-13: qty 1

## 2022-02-13 MED ORDER — SODIUM CHLORIDE 0.9 % IV SOLN: INTRAVENOUS | Status: AC

## 2022-02-13 MED ORDER — MIDODRINE HCL 5 MG PO TABS
2.5000 mg | ORAL_TABLET | Freq: Three times a day (TID) | ORAL | Status: DC
  Administered 2022-02-13 – 2022-02-23 (×31): 2.5 mg via ORAL
  Filled 2022-02-13 (×31): qty 1

## 2022-02-13 MED ORDER — DIVALPROEX SODIUM 250 MG PO DR TAB: 250.0000 mg | DELAYED_RELEASE_TABLET | Freq: Three times a day (TID) | ORAL | Status: DC

## 2022-02-13 MED ORDER — HALOPERIDOL 5 MG PO TABS
5.0000 mg | ORAL_TABLET | Freq: Once | ORAL | Status: AC
  Administered 2022-02-13: 5 mg via ORAL
  Filled 2022-02-13: qty 1

## 2022-02-13 MED ORDER — HALOPERIDOL LACTATE 5 MG/ML IJ SOLN
2.0000 mg | Freq: Four times a day (QID) | INTRAMUSCULAR | Status: DC | PRN
  Administered 2022-02-13 – 2022-02-22 (×3): 2 mg via INTRAMUSCULAR
  Filled 2022-02-13 (×6): qty 1

## 2022-02-13 MED ORDER — GABAPENTIN 100 MG PO CAPS
100.0000 mg | ORAL_CAPSULE | Freq: Two times a day (BID) | ORAL | Status: DC
  Administered 2022-02-13 – 2022-02-23 (×20): 100 mg via ORAL
  Filled 2022-02-13 (×20): qty 1

## 2022-02-13 NOTE — Consult Note (Cosign Needed Addendum)
[facility] Consult   Reason for Consult: Patient with anxiety/bipolar disorder/depression and family concerned about aggressive behavior.  Referring Physician: Dr. Marlowe Sax Patient Identification: [name] MRN:  [id number] Principal Diagnosis: Orthostatic hypotension Diagnosis:  Principal Problem:   Orthostatic hypotension Active Problems:   Syncope   Bipolar disorder (HCC)   Lumbar radiculopathy   Depression with anxiety   History of pulmonary embolism   Cerebral venous thrombosis of sigmoid sinus   Lower abdominal pain   Total Time spent with patient: 45 minutes  Subjective:   [name] is a 66 y.o. female  Please contact daughter Dajanee Voorheis for further information.  Patient remains with partial disorientation.  When assessing for orientation question patient is unable to answer any questions at this time. Patient continued to have an difficulty linking concepts, concentration and attention. Patient was observed making multiple attempts to get out of bed and swatting at nursing staff.  Patient is actively psychotic and delusional, despite multiple attempts to redirect we were unsuccessful.  She is a poor historian, and remains confused at this time.  She is currently paranoid feeling as if staff are out to get her.  Despite patient being notified of her current medical condition that warrant inpatient admission, she continues to state she is going home " and I have received treatment for my medical issues already.  That is why I am going home. "   Patient does have multiple underlying psychiatric conditions, and is currently being managed outpatient by Donnal Moat.  Patient does have a history of bipolar 1 disorder, depression, anxiety.  Currently being managed with amitriptyline, valproic acid, hydroxyzine, Risperdal.   Collateral obtained from daughter [name], who endorses increased and agitation, delusions, and aggression at home.  She reports her mother  does have an appointment with [name] neurology on Monday, in which she will request an evaluation for dementia.  She continues to report her mother is having assistance with increasing weakness, fall, failure to thrive at home, and now requires assistance with basic ADLs.  Daughter states the symptoms have been worse over the past month.  She does not associate any additional underlying infection process and or metabolic process that would facilitate worsening cognitive impairment and psychiatric symptoms.    HPI:   [name] is a 66 y.o. 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  Family requested nursing home placement due to patient having episodes of confusion and aggressive behavior.  In the ED, patient had severe orthostatic hypotension with blood pressure dropping from 153/97 supine to 86/62 standing.  No significant lab abnormalities except Depakote level subtherapeutic.  Family told RN that patient has not been taking her Depakote as prescribed.  In addition, she has not taken Lovenox for 3 days due to prescription running out.  CT head and C-spine negative for acute finding. Patient was given Haldol, Zofran, and 1 L normal saline bolus.  TRH called to admit.   Past Psychiatric History: Anxiety, Bipolar disorder, and depression.  Previously takes Xanax 1 mg p.o. 3 times daily as needed., meds recently discontinued for suspected diversion.  Also takes Depakote, Gabapentin, Hydroxyzine, Risperidone at home.  Patient is currently under the services of Donnal Moat at [facility], she is also receiving therapy through the same office.  Last OV 02/05/2022.   Risk to Self: Denies Risk to Others: Denies Prior Inpatient  Therapy: Denies Prior Outpatient Therapy: [facility].  Past Medical History:  Past Medical History:  Diagnosis Date   Anxiety    anxiety and mild depressive order systoms   Bipolar disorder ([place])     (02/20/2013)   Cervical cancer ([place]) 03/19/1980   Cholelithiasis 11/11/2021   Depression    Dysrhythmia    Headache(784.0)    "monthly" (02/20/2013)   Hyperlipidemia 11/11/2021   Pacemaker    medtronic   PONV (postoperative nausea and vomiting)    Sinus pause 02/14/2013   Syncope and collapse    echo-April 12,2012-EF 55% Echo normal; recorder with prolonged sinus pauses --> status post   Tobacco abuse    Vertigo     Past Surgical History:  Procedure Laterality Date   ABDOMINAL HYSTERECTOMY  03/19/1980   "partial"   BACK SURGERY     CERVICAL FUSION Left 11/2013   C4-C6   CERVICAL FUSION     3,4,5,   INSERT / REPLACE / REMOVE PACEMAKER  02/20/2013   medtronic   LOOP RECORDER IMPLANT N/A 11/06/2012   Procedure: LINQ LOOP RECORDER IMPLANT;  Surgeon: Sanda Klein, MD;  Location: [facility];  Service: Cardiovascular;  Laterality: N/A;   NECK SURGERY  11/17/2013   PACEMAKER PLACEMENT N/A 02/16/2013   PERMANENT PACEMAKER INSERTION N/A 02/20/2013   Procedure: PERMANENT PACEMAKER INSERTION;  Surgeon: Sanda Klein, MD;  Location: [facility];  Service: Cardiovascular;  Laterality: N/A;   POSTERIOR LUMBAR FUSION  ~ 2009   ROBOTIC ASSISTED SALPINGO OOPHERECTOMY Bilateral 11/25/2018   Procedure: XI ROBOTIC ASSISTED BILATERAL SALPINGO OOPHORECTOMY and LYSIS OF ADHESIONS.;  Surgeon: Everitt Amber, MD;  Location: [facility];  Service: Gynecology;  Laterality: Bilateral;   SPINAL FUSION  03/19/2014   TONSILLECTOMY     TRANSTHORACIC ECHOCARDIOGRAM  07/11/2010   The left atrial size is normal.There is no evidence of mitral vavle prolaspe.Right ventriicular systolic pressure is normal . Injection of contrast documented no interatrial shunt. Essentially normal 2D echo -doppler study     Family History:  Family History  Adopted: Yes  Problem Relation Age of Onset   Heart attack Brother    Family Psychiatric  History: Unable to assess Social History:  Social History   Substance and Sexual Activity  Alcohol Use No     Social History   Substance and Sexual Activity  Drug Use No    Social History   Socioeconomic History   Marital status: Widowed    Spouse name: Not on file   Number of children: 3   Years of education: 63   Highest education level: Not on file  Occupational History   Not on file  Tobacco Use   Smoking status: Every Day    Packs/day: 0.50    Years: 40.00    Total pack years: 20.00    Types: Cigarettes   Smokeless tobacco: Never  Vaping Use   Vaping Use: Never used  Substance and Sexual Activity   Alcohol use: No   Drug use: No   Sexual activity: Yes  Other Topics Concern   Not on file  Social History Narrative   Married 29 years . Has 3 children. Current smoker -1 pack a day-for 36 years .Alcohol :  1 beer on occasion.   She is soon to be a grandmother.  His before to going on a trip up to the [place] to  be closer to family when the baby is born.  She is extremely excited.   She very much would like to move back up to that area to be closer to family, but the whether is it just too cold in the winter.   Right handed    Soda daily   Lives alone   Social Determinants of Health   Financial Resource Strain: Low Risk  (10/11/2020)   Overall Financial Resource Strain (CARDIA)    Difficulty of Paying Living Expenses: Not hard at all  Food Insecurity: No Food Insecurity (11/22/2021)   Hunger Vital Sign    Worried About Running Out of Food in the Last Year: Never true    Ran Out of Food in the Last Year: Never true  Transportation Needs: No Transportation Needs (11/22/2021)   PRAPARE - Hydrologist (Medical): No    Lack of Transportation (Non-Medical): No  Physical Activity: Not on file   Stress: No Stress Concern Present (10/11/2020)   [place]    Feeling of Stress : Only a little  Social Connections: Not on file   Additional Social History:    Allergies:   Allergies  Allergen Reactions   Penicillins Other (See Comments)    Yeast infection Did it involve swelling of the face/tongue/throat, SOB, or low BP? No Did it involve sudden or severe rash/hives, skin peeling, or any reaction on the inside of your mouth or nose? No Did you need to seek medical attention at a hospital or doctor's office? Yes When did it last happen? More than 5 years ago  If all above answers are "NO", may proceed with cephalosporin use.     Labs:  Results for orders placed or performed during the hospital encounter of 02/12/22 (from the past 48 hour(s))  Basic metabolic panel     Status: None   Collection Time: 02/12/22 11:48 PM  Result Value Ref Range   Sodium 143 135 - 145 mmol/L   Potassium 4.0 3.5 - 5.1 mmol/L   Chloride 107 98 - 111 mmol/L   CO2 26 22 - 32 mmol/L   Glucose, Bld 89 70 - 99 mg/dL    Comment: Glucose reference range applies only to samples taken after fasting for at least 8 hours.   BUN 10 8 - 23 mg/dL   Creatinine, Ser 0.87 0.44 - 1.00 mg/dL   Calcium 9.1 8.9 - 10.3 mg/dL   GFR, Estimated >60 >60 mL/min    Comment: (NOTE) Calculated using the CKD-EPI Creatinine Equation (2021)    Anion gap 10 5 - 15    Comment: Performed at [facility] [address] [place], [place] [address]  CBC with Differential     Status: None   Collection Time: 02/12/22 11:48 PM  Result Value Ref Range   WBC 7.3 4.0 - 10.5 K/uL   RBC 4.32 3.87 - 5.11 MIL/uL   Hemoglobin 12.7 12.0 - 15.0 g/dL   HCT 40.0 36.0 - 46.0 %   MCV 92.6 80.0 - 100.0 fL   MCH 29.4 26.0 - 34.0 pg   MCHC 31.8 30.0 - 36.0 g/dL   RDW 13.6 11.5 - 15.5 %   Platelets 250 150 - 400 K/uL   nRBC 0.0 0.0 - 0.2 %   Neutrophils Relative % 55 %    Neutro Abs 4.0 1.7 - 7.7 K/uL   Lymphocytes Relative 32 %   Lymphs Abs 2.3  0.7 - 4.0 K/uL   Monocytes Relative 10 %   Monocytes Absolute 0.7 0.1 - 1.0 K/uL   Eosinophils Relative 2 %   Eosinophils Absolute 0.2 0.0 - 0.5 K/uL   Basophils Relative 1 %   Basophils Absolute 0.1 0.0 - 0.1 K/uL   Immature Granulocytes 0 %   Abs Immature Granulocytes 0.02 0.00 - 0.07 K/uL    Comment: Performed at [facility] [address] [place], [place] [address]  Magnesium     Status: None   Collection Time: 02/12/22 11:48 PM  Result Value Ref Range   Magnesium 2.0 1.7 - 2.4 mg/dL    Comment: Performed at [facility], [place] [address] [place], [place] [address]  Valproic acid level     Status: Abnormal   Collection Time: 02/13/22  1:47 AM  Result Value Ref Range   Valproic Acid Lvl 34 (L) 50.0 - 100.0 ug/mL    Comment: Performed at [facility] [address] [place], [place] [address]    Current Facility-Administered Medications  Medication Dose Route Frequency Provider Last Rate Last Admin   0.9 %  sodium chloride infusion   Intravenous Continuous Thurnell Lose, MD 75 mL/hr at 02/13/22 0840 New Bag at 02/13/22 0840   acetaminophen (TYLENOL) tablet 650 mg  650 mg Oral Q6H PRN Shela Leff, MD       Or   acetaminophen (TYLENOL) suppository 650 mg  650 mg Rectal Q6H PRN Shela Leff, MD       amitriptyline (ELAVIL) tablet 50 mg  50 mg Oral QHS Shela Leff, MD       divalproex (DEPAKOTE) DR tablet 250 mg  250 mg Oral Q8H Shela Leff, MD       enoxaparin (LOVENOX) injection 50 mg  50 mg Subcutaneous BID Shela Leff, MD       gabapentin (NEURONTIN) capsule 100 mg  100 mg Oral BID Shela Leff, MD       midodrine (PROAMATINE) tablet 2.5 mg  2.5 mg Oral TID WC Shela Leff, MD       risperiDONE (RISPERDAL) tablet 3 mg  3 mg Oral QHS Shela Leff, MD       Current Outpatient Medications  Medication Sig Dispense Refill    amitriptyline (ELAVIL) 25 MG tablet TAKE 2 TABLETS BY MOUTH AT BEDTIME. 180 tablet 0   divalproex (DEPAKOTE) 250 MG DR tablet Take 1 tablet (250 mg total) by mouth in the morning, at noon, and at bedtime. 90 tablet 0   enoxaparin (LOVENOX) 60 MG/0.6ML injection Inject 0.5 mLs (50 mg total) into the skin 2 (two) times daily. 30 mL 3   gabapentin (NEURONTIN) 100 MG capsule Take 1 capsule (100 mg total) by mouth 2 (two) times daily. 1 po bid for 4 days, then 1 po tid. 60 capsule 1   risperiDONE (RISPERDAL) 3 MG tablet Take 1 tablet (3 mg total) by mouth at bedtime. 90 tablet 0   ALPRAZolam (XANAX) 0.5 MG tablet Take 1 tablet (0.5 mg total) by mouth 2 (two) times daily as needed for anxiety. (Patient not taking: Reported on 02/13/2022) 10 tablet 0   hydrOXYzine (ATARAX) 50 MG tablet Take 1 tablet (50 mg total) by mouth 3 (three) times daily as needed. (Patient not taking: Reported on 02/13/2022) 60 tablet 0   lactulose (CHRONULAC) 10 GM/15ML solution Take 15 mLs (10 g total) by mouth 2 (two) times daily as needed for mild constipation. Titrate for 2-3 BM daily.  Hold if loose stool. (Patient not taking: Reported on 02/05/2022) 236 mL 0   levofloxacin (LEVAQUIN) 500 MG tablet Take 1 tablet (500 mg total) by mouth daily. (Patient not taking: Reported on 02/13/2022) 5 tablet 0   nitrofurantoin, macrocrystal-monohydrate, (MACROBID) 100 MG capsule Take 100 mg by mouth every 12 (twelve) hours. (Patient not taking: Reported on 02/05/2022)     tiZANidine (ZANAFLEX) 4 MG tablet Take 4 mg by mouth every 8 (eight) hours as needed for muscle spasms. (Patient not taking: Reported on 02/13/2022)      Musculoskeletal: Strength & Muscle Tone: decreased Gait & Station: unsteady Patient leans: N/A            Psychiatric Specialty Exam:  Presentation  General Appearance: Appropriate for Environment; Casual  Eye Contact:Good  Speech:Clear and Coherent; Normal Rate  Speech  Volume:Normal  Handedness:Right   Mood and Affect  Mood:irritable  Affect:Appropriate; Congruent   Thought Process  Thought Processes:Irrevelant  Descriptions of Associations:Loose  Orientation:Partial  Thought Content:Logical  History of Schizophrenia/Schizoaffective disorder:No data recorded Duration of Psychotic Symptoms:No data recorded Hallucinations:No data recorded  Ideas of Reference:None  Suicidal Thoughts:No data recorded  Homicidal Thoughts:No data recorded   Sensorium  Memory:Immediate Fair; Recent Poor; Remote Poor  Judgment:Poor  Insight:Lacking   Executive Functions  Concentration:Fair  Attention Span:Fair  Recall:Poor  Fund of Knowledge:Poor  Language:Fair   Psychomotor Activity  Psychomotor Activity:No data recorded   Assets  Assets:Communication Skills; Desire for Improvement; Financial Resources/Insurance; Physical Health; Leisure Time; Social Support   Sleep  Sleep:No data recorded  Physical Exam: Physical Exam Vitals and nursing note reviewed.  Constitutional:      Appearance: Normal appearance. She is normal weight.  Neurological:     Mental Status: She is alert.    ROS Blood pressure (!) 152/101, pulse (!) 101, temperature 97.9 F (36.6 C), temperature source Oral, resp. rate 20, height '5\' 7"'$  (1.702 m), weight 54.4 kg, SpO2 100 %. Body mass index is 18.79 kg/m.  Treatment Plan Summary: Daily contact with patient to assess and evaluate symptoms and progress in treatment, Medication management, and Plan   -Psychiatry will continue to follow daily. -Will resume home meds.  -last EKG obtained 11/27, QTC of 457. Patient with multiple risk factors for QTc prolongation to include cardiac abnormalities, female, age >22 and electrolyte abnormalities.  Psychotropic medication will likely cause worsening QTC prolongation.  As needed agitation protocol has been ordered.   Will limit as needed psychotropic medication such as  Haldol, Geodon, Zyprexa.   -Patient has outpatient appointment scheduled with neurology, however will recommend she pursue this appointment if discharged by Monday.   -Patient has not received any oxycodone and or outpatient Xanax, see provider documentation by outpatient psychiatry.   -Patient is at risk for developing delirium, recommend initiating delirium precautions to include safety sitter at night. -Patient with multiple attempts to leave [place], she lacks capacity, insight, and judgment at this time.  Patient will be placed under involuntary commitment.  One-to-one [employer] cabin crew will be ordered at this time.  Disposition: No evidence of imminent risk to self or others at present.   Patient does not meet criteria for psychiatric inpatient admission. Supportive therapy provided about ongoing stressors.  Suella Broad, FNP 02/13/2022 9:20 AM

## 2022-02-13 NOTE — ED Notes (Signed)
Late entry: Pt escalated, started to become agitated, trying repeatedly to get out of bed.  Several attempts to redirect pt were unsuccessful.  Pt at this point seemed to be hallucinating, talking to people that were not there.  Psych NP at bedside and PRN orders were given.  Pt attempted to hit and kick staff.  PRN meds administered.  Pt placed back in bed and pt still attempting to get out of bed.  MD messaged.

## 2022-02-13 NOTE — Progress Notes (Signed)
Orthopedic Tech Progress Note Patient Details:  [name] February 09, 1956 [id number]  Patient ID: [name], female   DOB: December 30, 1955, 66 y.o.   MRN: [id number] I attended trauma page. Karolee Stamps 02/13/2022, 4:26 AM

## 2022-02-13 NOTE — ED Notes (Addendum)
Pt getting up in bed stating that she is going to leave and that she doesn't need to be here. Pt also confused and making statements about the police and animals.   Pt upset with staff and requesting to go outside and smoke, pt informed that she cants leave to smoke but RN can request Nicotine patch.  Provider notified

## 2022-02-13 NOTE — ED Notes (Signed)
This NT tried to feed patient, patient stated she is not hungry and that she will eat when she is hungry

## 2022-02-13 NOTE — ED Notes (Signed)
Pt transported to Vascular 

## 2022-02-13 NOTE — ED Notes (Signed)
Pt assisted to the bathroom.  In the bathroom, pt noted to have a foley catheter with a full leg bag.  Leg back emptied and pt assisted back into bed.  Unsteady gait observed.  Educated pt on not getting out of bed and to call for RN assistance.  Pt in hallway bed in front of nurse's station.

## 2022-02-13 NOTE — H&P (Signed)
History and Physical    [name] XLK:[id number] DOB: 1955-05-29 DOA: 02/12/2022  PCP: Lujean Amel, MD  Patient coming from: Home  Chief Complaint: Fall  HPI: [name] is a 66 y.o. 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  Family requested nursing home placement due to patient having episodes of confusion and aggressive behavior.  In the ED, patient had severe orthostatic hypotension with blood pressure dropping from 153/97 supine to 86/62 standing.  No significant lab abnormalities except Depakote level subtherapeutic.  Family told RN that patient has not been taking her Depakote as prescribed.  In addition, she has not taken Lovenox for 3 days due to prescription running out.  CT head and C-spine negative for acute finding. Patient was given Haldol, Zofran, and 1 L normal saline bolus.  [facility] called to admit.  Patient reports 1 fall in August and 2 recent falls over the past 1.5 weeks.  States last night she was in her bed watching TV, suddenly got up to go use the bathroom when she became dizzy and then blacked out.  Her daughter called EMS.  She denies fevers, cough, shortness of breath, or chest pain.  She reports history of recent UTI and is endorsing urinary frequency/urgency and pain across her lower abdomen.  Denies vomiting or diarrhea.   Review of Systems:  Review of Systems  All other systems reviewed and are negative.   Past Medical History:  Diagnosis Date   Anxiety    anxiety and mild depressive order systoms   Bipolar disorder ([place])     (02/20/2013)   Cervical cancer ([place]) 03/19/1980   Cholelithiasis 11/11/2021   Depression    Dysrhythmia    Headache(784.0)    "monthly" (02/20/2013)   Hyperlipidemia 11/11/2021    Pacemaker    medtronic   PONV (postoperative nausea and vomiting)    Sinus pause 02/14/2013   Syncope and collapse    echo-April 12,2012-EF 55% Echo normal; recorder with prolonged sinus pauses --> status post   Tobacco abuse    Vertigo     Past Surgical History:  Procedure Laterality Date   ABDOMINAL HYSTERECTOMY  03/19/1980   "partial"   BACK SURGERY     CERVICAL FUSION Left 11/2013   C4-C6   CERVICAL FUSION     3,4,5,   INSERT / REPLACE / REMOVE PACEMAKER  02/20/2013   medtronic   LOOP RECORDER IMPLANT N/A 11/06/2012   Procedure: LINQ LOOP RECORDER IMPLANT;  Surgeon: Sanda Klein, MD;  Location: [facility];  Service: Cardiovascular;  Laterality: N/A;   NECK SURGERY  11/17/2013   PACEMAKER PLACEMENT N/A 02/16/2013   PERMANENT PACEMAKER INSERTION N/A 02/20/2013   Procedure: PERMANENT PACEMAKER INSERTION;  Surgeon: Sanda Klein, MD;  Location: [facility];  Service: Cardiovascular;  Laterality: N/A;   POSTERIOR LUMBAR FUSION  ~ 2009   ROBOTIC ASSISTED SALPINGO OOPHERECTOMY Bilateral 11/25/2018   Procedure: XI ROBOTIC ASSISTED BILATERAL SALPINGO OOPHORECTOMY and LYSIS OF ADHESIONS.;  Surgeon: Everitt Amber, MD;  Location: [facility];  Service: Gynecology;  Laterality: Bilateral;   SPINAL FUSION  03/19/2014   TONSILLECTOMY     TRANSTHORACIC ECHOCARDIOGRAM  07/11/2010   The left atrial size is normal.There is no evidence of mitral vavle prolaspe.Right ventriicular systolic pressure is normal . Injection of contrast documented  no interatrial shunt. Essentially normal 2D echo -doppler study      reports that she has been smoking cigarettes. She has a 20.00 pack-year smoking history. She has never used smokeless tobacco. She reports that she does not drink alcohol and does not use drugs.  Allergies  Allergen Reactions   Penicillins Other (See Comments)    Yeast infection Did it involve swelling of the face/tongue/throat, SOB, or low BP? No Did it involve sudden or severe rash/hives,  skin peeling, or any reaction on the inside of your mouth or nose? No Did you need to seek medical attention at a hospital or doctor's office? Yes When did it last happen? More than 5 years ago  If all above answers are "NO", may proceed with cephalosporin use.     Family History  Adopted: Yes  Problem Relation Age of Onset   Heart attack Brother     Prior to Admission medications   Medication Sig Start Date End Date Taking? Authorizing Provider  amitriptyline (ELAVIL) 25 MG tablet TAKE 2 TABLETS BY MOUTH AT BEDTIME. 11/27/21  Yes Hurst, Teresa T, PA-C  divalproex (DEPAKOTE) 250 MG DR tablet Take 1 tablet (250 mg total) by mouth in the morning, at noon, and at bedtime. 12/21/21  Yes Hurst, Teresa T, PA-C  enoxaparin (LOVENOX) 60 MG/0.6ML injection Inject 0.5 mLs (50 mg total) into the skin 2 (two) times daily. 12/20/21 04/19/22 Yes Dede Query T, PA-C  gabapentin (NEURONTIN) 100 MG capsule Take 1 capsule (100 mg total) by mouth 2 (two) times daily. 1 po bid for 4 days, then 1 po tid. 02/05/22  Yes Donnal Moat T, PA-C  risperiDONE (RISPERDAL) 3 MG tablet Take 1 tablet (3 mg total) by mouth at bedtime. 12/21/21  Yes Hurst, Dorothea Glassman, PA-C  ALPRAZolam Duanne Moron) 0.5 MG tablet Take 1 tablet (0.5 mg total) by mouth 2 (two) times daily as needed for anxiety. Patient not taking: Reported on 02/13/2022 12/21/21   Donnal Moat T, PA-C  hydrOXYzine (ATARAX) 50 MG tablet Take 1 tablet (50 mg total) by mouth 3 (three) times daily as needed. Patient not taking: Reported on 02/13/2022 02/05/22   Donnal Moat T, PA-C  lactulose (CHRONULAC) 10 GM/15ML solution Take 15 mLs (10 g total) by mouth 2 (two) times daily as needed for mild constipation. Titrate for 2-3 BM daily. Hold if loose stool. Patient not taking: Reported on 02/05/2022 11/25/21   Antonieta Pert, MD  levofloxacin (LEVAQUIN) 500 MG tablet Take 1 tablet (500 mg total) by mouth daily. Patient not taking: Reported on 02/13/2022 02/04/22   Lajean Saver, MD   nitrofurantoin, macrocrystal-monohydrate, (MACROBID) 100 MG capsule Take 100 mg by mouth every 12 (twelve) hours. Patient not taking: Reported on 02/05/2022 01/31/22   [provider]  tiZANidine (ZANAFLEX) 4 MG tablet Take 4 mg by mouth every 8 (eight) hours as needed for muscle spasms. Patient not taking: Reported on 02/13/2022    [provider]    Physical Exam: Vitals:   02/12/22 2337 02/13/22 0040 02/13/22 0200 02/13/22 0330  BP:  (!) 86/62 (!) 183/102 (!) 125/90  Pulse:  (!) 109 80 81  Resp:  '18 18 17  '$ Temp:    97.8 F (36.6 C)  TempSrc:    Oral  SpO2:  99% 100% 100%  Weight: 54.4 kg     Height: '5\' 7"'$  (1.702 m)       Physical Exam Vitals reviewed.  Constitutional:      General: She is not in  acute distress. HENT:     Head: Normocephalic and atraumatic.  Eyes:     Extraocular Movements: Extraocular movements intact.  Cardiovascular:     Rate and Rhythm: Normal rate and regular rhythm.     Pulses: Normal pulses.  Pulmonary:     Effort: Pulmonary effort is normal. No respiratory distress.     Breath sounds: Normal breath sounds. No wheezing or rales.  Abdominal:     General: Bowel sounds are normal. There is no distension.     Palpations: Abdomen is soft.     Tenderness: There is no abdominal tenderness.  Musculoskeletal:        General: No swelling or tenderness.     Cervical back: Normal range of motion.  Skin:    General: Skin is warm and dry.  Neurological:     General: No focal deficit present.     Mental Status: She is alert and oriented to person, place, and time.     Labs on Admission: I have personally reviewed following labs and imaging studies  CBC: Recent Labs  Lab 02/11/22 1556 02/12/22 2348  WBC 6.9 7.3  NEUTROABS 5.0 4.0  HGB 12.8 12.7  HCT 40.0 40.0  MCV 92.0 92.6  PLT 259 326   Basic Metabolic Panel: Recent Labs  Lab 02/11/22 1556 02/12/22 2348  NA 143 143  K 3.3* 4.0  CL 107 107  CO2 25 26  GLUCOSE 125*  89  BUN 10 10  CREATININE 0.82 0.87  CALCIUM 8.8* 9.1  MG 2.0 2.0  PHOS 3.1  --    GFR: Estimated Creatinine Clearance: 56.1 mL/min (by C-G formula based on SCr of 0.87 mg/dL). Liver Function Tests: Recent Labs  Lab 02/11/22 1556  AST 15  ALT 10  ALKPHOS 60  BILITOT 0.6  PROT 5.8*  ALBUMIN 3.5   No results for input(s): "LIPASE", "AMYLASE" in the last 168 hours. No results for input(s): "AMMONIA" in the last 168 hours. Coagulation Profile: No results for input(s): "INR", "PROTIME" in the last 168 hours. Cardiac Enzymes: No results for input(s): "CKTOTAL", "CKMB", "CKMBINDEX", "TROPONINI" in the last 168 hours. BNP (last 3 results) No results for input(s): "PROBNP" in the last 8760 hours. HbA1C: No results for input(s): "HGBA1C" in the last 72 hours. CBG: No results for input(s): "GLUCAP" in the last 168 hours. Lipid Profile: No results for input(s): "CHOL", "HDL", "LDLCALC", "TRIG", "CHOLHDL", "LDLDIRECT" in the last 72 hours. Thyroid Function Tests: No results for input(s): "TSH", "T4TOTAL", "FREET4", "T3FREE", "THYROIDAB" in the last 72 hours. Anemia Panel: No results for input(s): "VITAMINB12", "FOLATE", "FERRITIN", "TIBC", "IRON", "RETICCTPCT" in the last 72 hours. Urine analysis:    Component Value Date/Time   COLORURINE YELLOW 02/04/2022 0428   APPEARANCEUR CLOUDY (A) 02/04/2022 0428   LABSPEC 1.025 02/04/2022 0428   PHURINE 6.5 02/04/2022 0428   GLUCOSEU NEGATIVE 02/04/2022 0428   HGBUR LARGE (A) 02/04/2022 0428   BILIRUBINUR SMALL (A) 02/04/2022 0428   KETONESUR 40 (A) 02/04/2022 0428   PROTEINUR >300 (A) 02/04/2022 0428   NITRITE POSITIVE (A) 02/04/2022 0428   LEUKOCYTESUR TRACE (A) 02/04/2022 0428    Radiological Exams on Admission: CT Head Wo Contrast  Result Date: 02/12/2022 CLINICAL DATA:  Recent fall while on blood thinners, initial encounter EXAM: CT HEAD WITHOUT CONTRAST CT CERVICAL SPINE WITHOUT CONTRAST TECHNIQUE: Multidetector CT imaging of  the head and cervical spine was performed following the standard protocol without intravenous contrast. Multiplanar CT image reconstructions of the cervical spine were also  generated. RADIATION DOSE REDUCTION: This exam was performed according to the departmental dose-optimization program which includes automated exposure control, adjustment of the mA and/or kV according to patient size and/or use of iterative reconstruction technique. COMPARISON:  02/11/2022 FINDINGS: CT HEAD FINDINGS Brain: No evidence of acute infarction, hemorrhage, hydrocephalus, extra-axial collection or mass lesion/mass effect. Mild atrophic changes are noted. Vascular: No hyperdense vessel or unexpected calcification. Skull: Normal. Negative for fracture or focal lesion. Sinuses/Orbits: No acute finding. Other: None. CT CERVICAL SPINE FINDINGS Alignment: Within normal limits. Skull base and vertebrae: 7 cervical segments are well visualized. Vertebral body height is well maintained. Changes of prior fusion at C4-5 and C5-6 are noted with anterior fixation. No acute fracture or acute facet abnormality is noted. Mild facet hypertrophic changes are seen. Mild osteophytic changes are noted most prominent at C6-7. Soft tissues and spinal canal: Surrounding soft tissue structures are within normal limits. Upper chest: Visualized lung apices are unremarkable. Other: None IMPRESSION: CT of the head: No acute intracranial abnormality noted. CT of the cervical spine: Mild degenerative and postoperative changes without acute abnormality. Electronically Signed   By: Inez Catalina M.D.   On: 02/12/2022 23:48   CT Cervical Spine Wo Contrast  Result Date: 02/12/2022 CLINICAL DATA:  Recent fall while on blood thinners, initial encounter EXAM: CT HEAD WITHOUT CONTRAST CT CERVICAL SPINE WITHOUT CONTRAST TECHNIQUE: Multidetector CT imaging of the head and cervical spine was performed following the standard protocol without intravenous contrast. Multiplanar  CT image reconstructions of the cervical spine were also generated. RADIATION DOSE REDUCTION: This exam was performed according to the departmental dose-optimization program which includes automated exposure control, adjustment of the mA and/or kV according to patient size and/or use of iterative reconstruction technique. COMPARISON:  02/11/2022 FINDINGS: CT HEAD FINDINGS Brain: No evidence of acute infarction, hemorrhage, hydrocephalus, extra-axial collection or mass lesion/mass effect. Mild atrophic changes are noted. Vascular: No hyperdense vessel or unexpected calcification. Skull: Normal. Negative for fracture or focal lesion. Sinuses/Orbits: No acute finding. Other: None. CT CERVICAL SPINE FINDINGS Alignment: Within normal limits. Skull base and vertebrae: 7 cervical segments are well visualized. Vertebral body height is well maintained. Changes of prior fusion at C4-5 and C5-6 are noted with anterior fixation. No acute fracture or acute facet abnormality is noted. Mild facet hypertrophic changes are seen. Mild osteophytic changes are noted most prominent at C6-7. Soft tissues and spinal canal: Surrounding soft tissue structures are within normal limits. Upper chest: Visualized lung apices are unremarkable. Other: None IMPRESSION: CT of the head: No acute intracranial abnormality noted. CT of the cervical spine: Mild degenerative and postoperative changes without acute abnormality. Electronically Signed   By: Inez Catalina M.D.   On: 02/12/2022 23:48   CT Head Wo Contrast  Result Date: 02/11/2022 CLINICAL DATA:  Head trauma, coagulopathy (Age 58-64y) EXAM: CT HEAD WITHOUT CONTRAST TECHNIQUE: Contiguous axial images were obtained from the base of the skull through the vertex without intravenous contrast. RADIATION DOSE REDUCTION: This exam was performed according to the departmental dose-optimization program which includes automated exposure control, adjustment of the mA and/or kV according to patient size  and/or use of iterative reconstruction technique. COMPARISON:  CT head 11/21/2021 FINDINGS: Brain: Cerebral ventricle sizes are concordant with the degree of cerebral volume loss. No evidence of large-territorial acute infarction. No parenchymal hemorrhage. No mass lesion. No extra-axial collection. No mass effect or midline shift. No hydrocephalus. Basilar cisterns are patent. Vascular: No hyperdense vessel. Skull: No acute fracture or focal lesion. Sinuses/Orbits:  Paranasal sinuses and mastoid air cells are clear. Bilateral lens replacement. Otherwise the orbits are unremarkable. Other: None. IMPRESSION: No acute intracranial abnormality. Electronically Signed   By: Iven Finn M.D.   On: 02/11/2022 17:19    EKG: Independently reviewed.  Sinus rhythm, no significant change since prior tracing.  Assessment and Plan  Severe orthostatic hypotension Patient presenting after multiple falls.  She has severe orthostatic hypotension with blood pressure dropping from 153/97 supine to 86/62 standing.  Not on any antihypertensives or diuretics. -IV fluid hydration -Start low-dose midodrine -Compression stockings -Fall precautions  Syncope Likely due to severe orthostasis.  However, does have history of sinus pauses status post pacemaker.  No aortic stenosis on previous echocardiogram done in February 2023.  Does have history of recent PE in August on therapeutic dose Lovenox.  Not tachycardic or hypoxic. -Cardiac monitoring -Pacemaker needs to be interrogated  Lower abdominal pain and urinary symptoms -UA ordered to assess for UTI -Bladder scan  Anxiety/bipolar disorder/depression Family reported patient not taking her Depakote and episodes of confusion/aggressive behavior.  Depakote level is subtherapeutic.  She is currently calm and answering questions appropriately.  CT head negative for acute finding. -Continue Depakote, amitriptyline, risperidone -Psychiatry consulted -Family requested  nursing home placement, TOC consulted  History of dural venous sinus thrombosis -Continue therapeutic dose Lovenox  History of PE -Continue therapeutic dose Lovenox  Chronic lumbar radiculopathy -Continue gabapentin  DVT prophylaxis: Lovenox Code Status: Full Code (discussed with the patient) Family Communication: No family available at this time. Consults called: Psychiatry Level of care: Telemetry bed Admission status: It is my clinical opinion that referral for OBSERVATION is reasonable and necessary in this patient based on the above information provided. The aforementioned taken together are felt to place the patient at high risk for further clinical deterioration. However, it is anticipated that the patient may be medically stable for discharge from the hospital within 24 to 48 hours.   Shela Leff MD [facility]  If 7PM-7AM, please contact night-coverage [email or web address]  02/13/2022, 6:07 AM

## 2022-02-13 NOTE — Telephone Encounter (Signed)
        Patient  visited [facility] on 11/19   Telephone encounter attempt :  1st  A HIPAA compliant voice message was left requesting a return call.  Instructed patient to call back     [facility] [employer]  [phone] [address], [place], [place] [address] Phone: [phone] Email: [email or web address]

## 2022-02-13 NOTE — ED Notes (Signed)
Pt foley noted to be leaking, unable to determine from where exactly.  Leg bag changed.  Balloon intact.  Messaged Dr. Candiss Norse and he states will have urology evaluate tomorrow.  Peri-care completed.  Brief placed.

## 2022-02-13 NOTE — Telephone Encounter (Signed)
Called back and spoke w/ daughter [name] (on [place]). They cannot bring mother to appt 02/15/22. Would like appt before 02/27/22. Scheduled sooner appt for 02/19/22 at 9am with Dr. Felecia Shelling for check in 830am. She verbalized understanding and appreciation.
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
## 2022-02-13 NOTE — Progress Notes (Signed)
Orthopedic Tech Progress Note Patient Details:  KEYONNA COMUNALE [birth year] [id number]  Patient ID: TANNYA GONET, female   DOB: [birth year], 66 y.o.   MRN: [id number] I attended trauma page. Karolee Stamps 02/13/2022, 1:37 AM

## 2022-02-13 NOTE — ED Notes (Signed)
3 copies of IVC paperwork in blue zone given to [name], original in red folder , 1 copy in medica; records

## 2022-02-13 NOTE — Progress Notes (Signed)
PT Cancellation Note  Patient Details Name: [name] MRN: [id number] DOB: 19-Dec-1955   Cancelled Treatment:    Reason Eval/Treat Not Completed: Other (comment)  PT orders received and chart reviewed.  Spoke with RN.  Pt hallucinating, kicking, agitated, restrained.  Pt not appropriate for PT at this time.  Will f/u when able.  [name], PT Acute Rehab [facility] [phone]  [name] 02/13/2022, 3:25 PM

## 2022-02-13 NOTE — ED Notes (Signed)
Family at bedside and requesting to speak to EDP, Family states that pt has not had any Lovenox for 3 days due to pharmacy running out and daughter does not think that pt has been taking her Depakote as prescribed. Provider notified

## 2022-02-13 NOTE — ED Notes (Signed)
Pt refusing vitals recheck at this time, she states that she doesn't need her blood pressure checked because she's going home. RN notified. Will try again in a few minutes.

## 2022-02-13 NOTE — ED Notes (Signed)
Safety sitter at beside now.

## 2022-02-13 NOTE — ED Notes (Signed)
Pt appears to be hallucinating.  She is talking to herself, appears to be responding to internal stimuli.

## 2022-02-13 NOTE — Telephone Encounter (Signed)
Pt daughter is calling. Stated she need to talk to nurse about rescheduling appointment for 11/30 because she can't come. Stated pt is seeing dead pt. She is requesting a call-back from nurse.

## 2022-02-13 NOTE — Progress Notes (Addendum)
[name], is a 66 y.o. female, DOB - [birth year], MRN:[id number]  67 year old female admitted to the hospital 1 hr ago for recurrent syncopal episodes at home, couple of ER visits in the last few days, workup here consistent with severe orthostatic hypotension and some dehydration, she is also listed to be taking muscle relaxers and benzodiazepine at home, she confirms that she takes muscle relaxers however med rec states that she has not been taking them.  At this point she will be hydrated IV fluids, added PT OT, TED stockings, check TSH and a.m. cortisol add them to the morning lab draw, fortunately her CT head and C-spine are unremarkable, she has no headache or focal deficits.  Continue to monitor with advance activity.  Will check echocardiogram as well to rule out any significant valvular abnormalities.    She also has some underlying psych issues per family for which psychiatry has been consulted.  Indwelling Foley POA.  Called the listed number for daughter [name] on [phone] and left a message on 02/13/2022 at 8:25 AM   Vitals:   02/13/22 0200 02/13/22 0330 02/13/22 0600 02/13/22 0650  BP: (!) 183/102 (!) 125/90 (!) 152/101   Pulse: 80 81 (!) 101   Resp: '18 17 20   '$ Temp:  97.8 F (36.6 C)  97.9 F (36.6 C)  TempSrc:  Oral  Oral  SpO2: 100% 100% 100%   Weight:      Height:        Awake x 2, mild intermittent confusion, no focal deficits [place].AT,PERRAL Supple Neck, No JVD,   Symmetrical Chest wall movement, Good air movement bilaterally, CTAB RRR,No Gallops, Rubs or new Murmurs,  +ve B.Sounds, Abd Soft, No tenderness,   No Cyanosis, Clubbing or edema     Data Review   Micro Results No results found for this or any previous visit (from the past 240 hour(s)).  Radiology Reports CT Head Wo Contrast  Result Date: 02/12/2022 CLINICAL DATA:  Recent fall while on blood thinners,  initial encounter EXAM: CT HEAD WITHOUT CONTRAST CT CERVICAL SPINE WITHOUT CONTRAST TECHNIQUE: Multidetector CT imaging of the head and cervical spine was performed following the standard protocol without intravenous contrast. Multiplanar CT image reconstructions of the cervical spine were also generated. RADIATION DOSE REDUCTION: This exam was performed according to the departmental dose-optimization program which includes automated exposure control, adjustment of the mA and/or kV according to patient size and/or use of iterative reconstruction technique. COMPARISON:  02/11/2022 FINDINGS: CT HEAD FINDINGS Brain: No evidence of acute infarction, hemorrhage, hydrocephalus, extra-axial collection or mass lesion/mass effect. Mild atrophic changes are noted. Vascular: No hyperdense vessel or unexpected calcification. Skull: Normal. Negative for fracture or focal lesion. Sinuses/Orbits: No acute finding. Other: None. CT CERVICAL SPINE FINDINGS Alignment: Within normal limits. Skull base and vertebrae: 7 cervical segments are well visualized. Vertebral body height is well maintained. Changes of prior fusion at C4-5 and C5-6 are noted with anterior fixation. No acute fracture or acute facet abnormality is noted. Mild facet hypertrophic changes are seen. Mild osteophytic changes are noted most prominent at C6-7. Soft tissues and spinal canal: Surrounding soft tissue structures are within normal limits. Upper chest: Visualized lung apices are unremarkable. Other: None IMPRESSION: CT of the head: No acute intracranial abnormality noted. CT of the cervical spine: Mild degenerative and postoperative changes without acute abnormality. Electronically Signed   By: Inez Catalina M.D.   On: 02/12/2022 23:48   CT Cervical Spine Wo Contrast  Result Date: 02/12/2022  CLINICAL DATA:  Recent fall while on blood thinners, initial encounter EXAM: CT HEAD WITHOUT CONTRAST CT CERVICAL SPINE WITHOUT CONTRAST TECHNIQUE: Multidetector CT  imaging of the head and cervical spine was performed following the standard protocol without intravenous contrast. Multiplanar CT image reconstructions of the cervical spine were also generated. RADIATION DOSE REDUCTION: This exam was performed according to the departmental dose-optimization program which includes automated exposure control, adjustment of the mA and/or kV according to patient size and/or use of iterative reconstruction technique. COMPARISON:  02/11/2022 FINDINGS: CT HEAD FINDINGS Brain: No evidence of acute infarction, hemorrhage, hydrocephalus, extra-axial collection or mass lesion/mass effect. Mild atrophic changes are noted. Vascular: No hyperdense vessel or unexpected calcification. Skull: Normal. Negative for fracture or focal lesion. Sinuses/Orbits: No acute finding. Other: None. CT CERVICAL SPINE FINDINGS Alignment: Within normal limits. Skull base and vertebrae: 7 cervical segments are well visualized. Vertebral body height is well maintained. Changes of prior fusion at C4-5 and C5-6 are noted with anterior fixation. No acute fracture or acute facet abnormality is noted. Mild facet hypertrophic changes are seen. Mild osteophytic changes are noted most prominent at C6-7. Soft tissues and spinal canal: Surrounding soft tissue structures are within normal limits. Upper chest: Visualized lung apices are unremarkable. Other: None IMPRESSION: CT of the head: No acute intracranial abnormality noted. CT of the cervical spine: Mild degenerative and postoperative changes without acute abnormality. Electronically Signed   By: Inez Catalina M.D.   On: 02/12/2022 23:48   CT Head Wo Contrast  Result Date: 02/11/2022 CLINICAL DATA:  Head trauma, coagulopathy (Age 23-64y) EXAM: CT HEAD WITHOUT CONTRAST TECHNIQUE: Contiguous axial images were obtained from the base of the skull through the vertex without intravenous contrast. RADIATION DOSE REDUCTION: This exam was performed according to the departmental  dose-optimization program which includes automated exposure control, adjustment of the mA and/or kV according to patient size and/or use of iterative reconstruction technique. COMPARISON:  CT head 11/21/2021 FINDINGS: Brain: Cerebral ventricle sizes are concordant with the degree of cerebral volume loss. No evidence of large-territorial acute infarction. No parenchymal hemorrhage. No mass lesion. No extra-axial collection. No mass effect or midline shift. No hydrocephalus. Basilar cisterns are patent. Vascular: No hyperdense vessel. Skull: No acute fracture or focal lesion. Sinuses/Orbits: Paranasal sinuses and mastoid air cells are clear. Bilateral lens replacement. Otherwise the orbits are unremarkable. Other: None. IMPRESSION: No acute intracranial abnormality. Electronically Signed   By: Iven Finn M.D.   On: 02/11/2022 17:19   CUP PACEART REMOTE DEVICE CHECK  Result Date: 02/06/2022 Scheduled remote reviewed. Normal device function.  Last remote 2/16, brief VHR's, 8-11 beats NSVT and SVT AHR's, longest duraiton 98mn 48sec, SVT, one with possible noise 10 rate drop episodes Next remote 91 days. LA  CT Abdomen Pelvis W Contrast  Result Date: 02/04/2022 CLINICAL DATA:  Right lower quadrant abdominal pain EXAM: CT ABDOMEN AND PELVIS WITH CONTRAST TECHNIQUE: Multidetector CT imaging of the abdomen and pelvis was performed using the standard protocol following bolus administration of intravenous contrast. RADIATION DOSE REDUCTION: This exam was performed according to the departmental dose-optimization program which includes automated exposure control, adjustment of the mA and/or kV according to patient size and/or use of iterative reconstruction technique. CONTRAST:  1083mOMNIPAQUE IOHEXOL 300 MG/ML  SOLN COMPARISON:  Prior CT scan of the abdomen and pelvis 11/11/2021 FINDINGS: Lower chest: No acute abnormality. Hepatobiliary: No focal liver abnormality is seen. No gallstones, gallbladder wall  thickening, or biliary dilatation. Pancreas: Unremarkable. No pancreatic ductal dilatation or  surrounding inflammatory changes. Spleen: Normal in size without focal abnormality. Adrenals/Urinary Tract: Adrenal glands are unremarkable. Kidneys are normal, without renal calculi, focal lesion, or hydronephrosis. A Foley catheter is present within the bladder. The bladder wall appears hazy, thickened and hyperemic consistent with cystitis. Stomach/Bowel: Stomach is within normal limits. Appendix appears normal. No evidence of bowel wall thickening, distention, or inflammatory changes. Vascular/Lymphatic: Atherosclerotic calcifications along the abdominal aorta. No evidence of aneurysm or dissection. No suspicious lymphadenopathy. Reproductive: Status post hysterectomy. No adnexal masses. Other: No abdominal wall hernia or abnormality. No abdominopelvic ascites. Musculoskeletal: Surgical changes of prior L4-L5 posterior lumbar interbody fusion with interbody graft in place. No evidence of hardware complication. IMPRESSION: 1. Thickened irregular bladder wall with evidence of hyperemia. Findings suggest cystitis. Differential includes both infectious and inflammatory etiologies. 2. Otherwise, no acute abnormality within the abdomen or pelvis. 3.  Aortic Atherosclerosis (ICD10-I70.0). Electronically Signed   By: Jacqulynn Cadet M.D.   On: 02/04/2022 07:22   CT HEAD WO CONTRAST  Result Date: 02/04/2022 CLINICAL DATA:  Mental status changes, near syncope EXAM: CT HEAD WITHOUT CONTRAST TECHNIQUE: Contiguous axial images were obtained from the base of the skull through the vertex without intravenous contrast. RADIATION DOSE REDUCTION: This exam was performed according to the departmental dose-optimization program which includes automated exposure control, adjustment of the mA and/or kV according to patient size and/or use of iterative reconstruction technique. COMPARISON:  Prior CT scan of the head 05/03/2021 FINDINGS:  Brain: No evidence of acute infarction, hemorrhage, hydrocephalus, extra-axial collection or mass lesion/mass effect. Vascular: No hyperdense vessel or unexpected calcification. Skull: Normal. Negative for fracture or focal lesion. Sinuses/Orbits: No acute finding. Other: None. IMPRESSION: No acute intracranial abnormality. Electronically Signed   By: Jacqulynn Cadet M.D.   On: 02/04/2022 07:18   DG Chest Port 1 View  Result Date: 02/04/2022 CLINICAL DATA:  66 year old female with history of altered mental status. Syncope. EXAM: PORTABLE CHEST 1 VIEW COMPARISON:  Chest x-ray 01/18/2022. FINDINGS: Lung volumes are normal. No consolidative airspace disease. No pleural effusions. No pneumothorax. No pulmonary nodule or mass noted. Pulmonary vasculature and the cardiomediastinal silhouette are within normal limits. Atherosclerosis in the thoracic aorta. 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 IMPRESSION: 1.  No radiographic evidence of acute cardiopulmonary disease. 2. Aortic atherosclerosis. Electronically Signed   By: Vinnie Langton M.D.   On: 02/04/2022 05:23   CT Soft Tissue Neck W Contrast  Result Date: 01/27/2022 CLINICAL DATA:  Epiglottitis or tonsillitis suspected EXAM: CT NECK WITH CONTRAST TECHNIQUE: Multidetector CT imaging of the neck was performed using the standard protocol following the bolus administration of intravenous contrast. RADIATION DOSE REDUCTION: This exam was performed according to the departmental dose-optimization program which includes automated exposure control, adjustment of the mA and/or kV according to patient size and/or use of iterative reconstruction technique. CONTRAST:  53m OMNIPAQUE IOHEXOL 300 MG/ML  SOLN COMPARISON:  CTA neck 12/15/2019. FINDINGS: Pharynx and larynx: Normal. No mass or swelling. Unremarkable epiglottis. Salivary glands: No inflammation, mass, or stone.  Thyroid: Normal. Lymph nodes: None enlarged or abnormal density. Vascular: Negative. Limited intracranial: Negative. Visualized orbits: Negative. Mastoids and visualized paranasal sinuses: Clear. Skeleton: Solid C4-C7 ACDF. Upper chest: Visualized lung apices are clear. IMPRESSION: Unremarkable CT of the neck.  No acute findings. Electronically Signed   By: FMargaretha SheffieldM.D.   On: 01/27/2022 19:59   DG Abdomen 1 View  Result Date: 01/18/2022  CLINICAL DATA:  Near syncope, lightheadedness, dizziness, possible pneumatosis EXAM: ABDOMEN - 1 VIEW COMPARISON:  Chest x-ray 01/18/2022, abdominal CT 11/11/2021 FINDINGS: Supine frontal view of the abdomen and pelvis excludes the bilateral hemidiaphragms by collimation. The lucencies seen within the right upper quadrant on the preceding chest x-ray likely represent interface between the hepatic and renal silhouettes. There is mild gaseous distension of the small and large bowel, without clear transition point. Findings could reflect generalized ileus. No masses or abnormal calcifications. Postsurgical changes in the lower lumbar spine. No acute bony abnormalities. IMPRESSION: 1. Mild diffuse gaseous distension of the large and small bowel, which could reflect ileus. 2. No evidence of pneumatosis within the visualized portions of the abdomen and pelvis. The reported lucencies within the right upper quadrant on prior chest x-ray likely reflect confluence of shadows from the hepatic and right renal silhouettes. Electronically Signed   By: Randa Ngo M.D.   On: 01/18/2022 17:27   DG Chest 2 View  Result Date: 01/18/2022 CLINICAL DATA:  Near-syncope EXAM: CHEST - 2 VIEW COMPARISON:  11/22/2021 FINDINGS: Left-sided dual lead cardiac device in place with unchanged lead positioning. Pleural effusion. Pneumothorax. No focal airspace opacity. Cardiac and mediastinal contours. No displaced rib fractures. Visualized upper abdomen is notable for multiple prominent loops of  large bowel and a gas distended stomach. In the right upper quadrant there appear to be linear lucencies presumed to be along loops of small bowel. IMPRESSION: 1. No acute cardiopulmonary process identified. 2. Visualized upper abdomen is notable for multiple prominent loops of large bowel and a gas distended stomach. In the right upper quadrant there appear to be linear lucencies presumed to be along loops of small bowel. If there is concern for abdominal pathology, particularly pneumatosis, further evaluation with an abdominal radiograph is recommended. Electronically Signed   By: Marin Roberts M.D.   On: 01/18/2022 14:29    CBC Recent Labs  Lab 02/11/22 1556 02/12/22 2348  WBC 6.9 7.3  HGB 12.8 12.7  HCT 40.0 40.0  PLT 259 250  MCV 92.0 92.6  MCH 29.4 29.4  MCHC 32.0 31.8  RDW 13.5 13.6  LYMPHSABS 1.4 2.3  MONOABS 0.4 0.7  EOSABS 0.1 0.2  BASOSABS 0.1 0.1    Chemistries  Recent Labs  Lab 02/11/22 1556 02/12/22 2348  NA 143 143  K 3.3* 4.0  CL 107 107  CO2 25 26  GLUCOSE 125* 89  BUN 10 10  CREATININE 0.82 0.87  CALCIUM 8.8* 9.1  MG 2.0 2.0  AST 15  --   ALT 10  --   ALKPHOS 60  --   BILITOT 0.6  --       Signature   Lala Lund M.D on 02/13/2022 at 8:25 AM   -  To page go to [email or web address]

## 2022-02-14 ENCOUNTER — Other Ambulatory Visit: Payer: Self-pay

## 2022-02-14 ENCOUNTER — Encounter (HOSPITAL_COMMUNITY): Payer: Self-pay | Admitting: Internal Medicine

## 2022-02-14 ENCOUNTER — Inpatient Hospital Stay: Payer: Medicare Other | Admitting: Physician Assistant

## 2022-02-14 DIAGNOSIS — I951 Orthostatic hypotension: Secondary | ICD-10-CM | POA: Diagnosis not present

## 2022-02-14 DIAGNOSIS — I071 Rheumatic tricuspid insufficiency: Secondary | ICD-10-CM | POA: Diagnosis present

## 2022-02-14 DIAGNOSIS — Z950 Presence of cardiac pacemaker: Secondary | ICD-10-CM | POA: Diagnosis not present

## 2022-02-14 DIAGNOSIS — N390 Urinary tract infection, site not specified: Secondary | ICD-10-CM | POA: Diagnosis present

## 2022-02-14 DIAGNOSIS — R2681 Unsteadiness on feet: Secondary | ICD-10-CM | POA: Diagnosis not present

## 2022-02-14 DIAGNOSIS — Z86718 Personal history of other venous thrombosis and embolism: Secondary | ICD-10-CM | POA: Diagnosis not present

## 2022-02-14 DIAGNOSIS — R55 Syncope and collapse: Secondary | ICD-10-CM | POA: Diagnosis not present

## 2022-02-14 DIAGNOSIS — Z90711 Acquired absence of uterus with remaining cervical stump: Secondary | ICD-10-CM | POA: Diagnosis not present

## 2022-02-14 DIAGNOSIS — W19XXXA Unspecified fall, initial encounter: Secondary | ICD-10-CM | POA: Diagnosis present

## 2022-02-14 DIAGNOSIS — R296 Repeated falls: Secondary | ICD-10-CM | POA: Diagnosis present

## 2022-02-14 DIAGNOSIS — G08 Intracranial and intraspinal phlebitis and thrombophlebitis: Secondary | ICD-10-CM | POA: Diagnosis not present

## 2022-02-14 DIAGNOSIS — Z8541 Personal history of malignant neoplasm of cervix uteri: Secondary | ICD-10-CM | POA: Diagnosis not present

## 2022-02-14 DIAGNOSIS — E785 Hyperlipidemia, unspecified: Secondary | ICD-10-CM | POA: Diagnosis present

## 2022-02-14 DIAGNOSIS — Z86711 Personal history of pulmonary embolism: Secondary | ICD-10-CM

## 2022-02-14 DIAGNOSIS — R404 Transient alteration of awareness: Secondary | ICD-10-CM | POA: Diagnosis not present

## 2022-02-14 DIAGNOSIS — F22 Delusional disorders: Secondary | ICD-10-CM | POA: Diagnosis present

## 2022-02-14 DIAGNOSIS — M6259 Muscle wasting and atrophy, not elsewhere classified, multiple sites: Secondary | ICD-10-CM | POA: Diagnosis not present

## 2022-02-14 DIAGNOSIS — Z7401 Bed confinement status: Secondary | ICD-10-CM | POA: Diagnosis not present

## 2022-02-14 DIAGNOSIS — M5416 Radiculopathy, lumbar region: Secondary | ICD-10-CM | POA: Diagnosis not present

## 2022-02-14 DIAGNOSIS — F319 Bipolar disorder, unspecified: Secondary | ICD-10-CM | POA: Diagnosis present

## 2022-02-14 DIAGNOSIS — E876 Hypokalemia: Secondary | ICD-10-CM | POA: Diagnosis present

## 2022-02-14 DIAGNOSIS — Z9181 History of falling: Secondary | ICD-10-CM | POA: Diagnosis not present

## 2022-02-14 DIAGNOSIS — F17210 Nicotine dependence, cigarettes, uncomplicated: Secondary | ICD-10-CM | POA: Diagnosis present

## 2022-02-14 DIAGNOSIS — F419 Anxiety disorder, unspecified: Secondary | ICD-10-CM | POA: Diagnosis present

## 2022-02-14 DIAGNOSIS — Z8249 Family history of ischemic heart disease and other diseases of the circulatory system: Secondary | ICD-10-CM | POA: Diagnosis not present

## 2022-02-14 DIAGNOSIS — Z743 Need for continuous supervision: Secondary | ICD-10-CM | POA: Diagnosis not present

## 2022-02-14 DIAGNOSIS — Y92009 Unspecified place in unspecified non-institutional (private) residence as the place of occurrence of the external cause: Secondary | ICD-10-CM | POA: Diagnosis not present

## 2022-02-14 DIAGNOSIS — Z87440 Personal history of urinary (tract) infections: Secondary | ICD-10-CM | POA: Diagnosis not present

## 2022-02-14 DIAGNOSIS — R350 Frequency of micturition: Secondary | ICD-10-CM | POA: Diagnosis present

## 2022-02-14 DIAGNOSIS — S0003XA Contusion of scalp, initial encounter: Secondary | ICD-10-CM | POA: Diagnosis present

## 2022-02-14 DIAGNOSIS — G40909 Epilepsy, unspecified, not intractable, without status epilepticus: Secondary | ICD-10-CM | POA: Diagnosis present

## 2022-02-14 DIAGNOSIS — M6281 Muscle weakness (generalized): Secondary | ICD-10-CM | POA: Diagnosis not present

## 2022-02-14 DIAGNOSIS — F418 Other specified anxiety disorders: Secondary | ICD-10-CM

## 2022-02-14 DIAGNOSIS — R262 Difficulty in walking, not elsewhere classified: Secondary | ICD-10-CM | POA: Diagnosis not present

## 2022-02-14 DIAGNOSIS — Z981 Arthrodesis status: Secondary | ICD-10-CM | POA: Diagnosis not present

## 2022-02-14 DIAGNOSIS — R1030 Lower abdominal pain, unspecified: Secondary | ICD-10-CM

## 2022-02-14 DIAGNOSIS — R000 Tachycardia, unspecified: Secondary | ICD-10-CM | POA: Diagnosis present

## 2022-02-14 DIAGNOSIS — Z741 Need for assistance with personal care: Secondary | ICD-10-CM | POA: Diagnosis not present

## 2022-02-14 LAB — COMPREHENSIVE METABOLIC PANEL
ALT: 10 U/L (ref 0–44)
AST: 12 U/L — ABNORMAL LOW (ref 15–41)
Albumin: 3.1 g/dL — ABNORMAL LOW (ref 3.5–5.0)
Alkaline Phosphatase: 66 U/L (ref 38–126)
Anion gap: 9 (ref 5–15)
BUN: 7 mg/dL — ABNORMAL LOW (ref 8–23)
CO2: 23 mmol/L (ref 22–32)
Calcium: 8.4 mg/dL — ABNORMAL LOW (ref 8.9–10.3)
Chloride: 101 mmol/L (ref 98–111)
Creatinine, Ser: 0.79 mg/dL (ref 0.44–1.00)
GFR, Estimated: >60 mL/min (ref >60)
Glucose, Bld: 87 mg/dL (ref 70–99)
Potassium: 3.9 mmol/L (ref 3.5–5.1)
Sodium: 133 mmol/L — ABNORMAL LOW (ref 135–145)
Total Bilirubin: 1.3 mg/dL — ABNORMAL HIGH (ref 0.3–1.2)
Total Protein: 5.5 g/dL — ABNORMAL LOW (ref 6.5–8.1)

## 2022-02-14 LAB — CBC WITH DIFFERENTIAL/PLATELET
Abs Immature Granulocytes: 0.02 10*3/uL (ref 0.00–0.07)
Basophils Absolute: 0.1 10*3/uL (ref 0.0–0.1)
Basophils Relative: 1 %
Eosinophils Absolute: 0.1 10*3/uL (ref 0.0–0.5)
Eosinophils Relative: 2 %
HCT: 39.4 % (ref 36.0–46.0)
Hemoglobin: 12.7 g/dL (ref 12.0–15.0)
Immature Granulocytes: 0 %
Lymphocytes Relative: 35 %
Lymphs Abs: 2.3 10*3/uL (ref 0.7–4.0)
MCH: 29.2 pg (ref 26.0–34.0)
MCHC: 32.2 g/dL (ref 30.0–36.0)
MCV: 90.6 fL (ref 80.0–100.0)
Monocytes Absolute: 0.6 10*3/uL (ref 0.1–1.0)
Monocytes Relative: 9 %
Neutro Abs: 3.5 10*3/uL (ref 1.7–7.7)
Neutrophils Relative %: 53 %
Platelets: 231 10*3/uL (ref 150–400)
RBC: 4.35 MIL/uL (ref 3.87–5.11)
RDW: 13.2 % (ref 11.5–15.5)
WBC: 6.6 10*3/uL (ref 4.0–10.5)
nRBC: 0 % (ref 0.0–0.2)

## 2022-02-14 LAB — BRAIN NATRIURETIC PEPTIDE: B Natriuretic Peptide: 53.4 pg/mL (ref 0.0–100.0)

## 2022-02-14 LAB — MAGNESIUM: Magnesium: 1.9 mg/dL (ref 1.7–2.4)

## 2022-02-14 MED ORDER — ORAL CARE MOUTH RINSE: 15.0000 mL | OROMUCOSAL | Status: DC | PRN

## 2022-02-14 MED ORDER — SODIUM CHLORIDE 0.9 % IV SOLN
1.0000 g | INTRAVENOUS | Status: DC
  Administered 2022-02-14 – 2022-02-17 (×4): 1 g via INTRAVENOUS
  Filled 2022-02-14 (×4): qty 10

## 2022-02-14 MED ORDER — ORAL CARE MOUTH RINSE
15.0000 mL | OROMUCOSAL | Status: DC
  Administered 2022-02-14 – 2022-02-23 (×33): 15 mL via OROMUCOSAL

## 2022-02-14 MED ORDER — SODIUM CHLORIDE 0.9 % IV SOLN: INTRAVENOUS | Status: AC

## 2022-02-14 MED ORDER — CHLORHEXIDINE GLUCONATE CLOTH 2 % EX PADS
6.0000 | MEDICATED_PAD | Freq: Every day | CUTANEOUS | Status: DC
  Administered 2022-02-14 – 2022-02-23 (×10): 6 via TOPICAL

## 2022-02-14 NOTE — Plan of Care (Signed)
  Problem: Clinical Measurements: Goal: Respiratory complications will improve Outcome: Progressing Goal: Cardiovascular complication will be avoided Outcome: Progressing   Problem: Skin Integrity: Goal: Risk for impaired skin integrity will decrease Outcome: Progressing   

## 2022-02-14 NOTE — Progress Notes (Signed)
Previous foley inserted 01/21/2022 at [facility] ED. New foley inserted 1515 today patient tolerated well.

## 2022-02-14 NOTE — Progress Notes (Signed)
OT Cancellation Note  Patient Details Name: [name] MRN: [id number] DOB: 08/01/1955   Cancelled Treatment:     Patient seen by PT, resting.  OT to continue efforts as appropriate.    Shaquitta Burbridge D Maydelin Deming 02/14/2022, 1:57 PM 02/14/2022  RP, OTR/L  Acute Rehabilitation Services  Office:  [phone]

## 2022-02-14 NOTE — Progress Notes (Signed)
New Admission Note:  Arrival Method: Stretcher Mental Orientation:  Alert to self only Telemetry: Box 08 Assessment: Completed Skin: Warm and dry IV: NSL Pain: Denies Tubes: Foley Cath Safety Measures: Safety Fall Prevention Plan initiated.  Admission: Completed 5 M  Orientation: Patient has been orientated to the room, unit and the staff. Welcome booklet given.  Family: None . Sitter at bedside  Orders have been reviewed and implemented. Will continue to monitor the patient. Call light has been placed within reach and bed alarm has been activated.   Sima Matas BSN, RN  Phone Number: [phone]

## 2022-02-14 NOTE — Progress Notes (Addendum)
PROGRESS NOTE    [name]  YBO:[id number] DOB: [birth year] DOA: 02/12/2022 PCP: Lujean Amel, MD    Brief Narrative:  [name] is a 66 y.o. 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  She was watching TV and when she was trying to go to the bathroom she felt dizzy and blacked out.  Family had requested nursing home placement due to confusion and aggressive behavior.  In the ED, patient was noted to be orthostatic.  Laboratory data was unremarkable.  Patient was not taking her Depakote as prescribed and also Lovenox for 3 days.  CT head and C-spine negative for acute finding.  Patient does have history of recurrent falls recently.  Patient was then considered for admission to the hospital for further evaluation and treatment.   Assessment and Plan:  Syncope likely secondary to severe orthostatic hypotension Not on antihypertensives or diuretics.  Was started on IV fluids.  Continue midodrine, compression stockings, fall precautions.  History of sinus pause status post pacemaker.  No aortic stenosis on previous echocardiogram done in February 2023.  TSH of 1.2.  Physical therapy has recommended skilled nursing facility on discharge.  Continue IV fluids at normal saline 75 mill per hour.  Reassess volume status in AM.   Lower abdominal pain and urinary symptoms Urinalysis with trace leukocytes negative nitrates but 6-10 white cells.  Add Rocephin IV.  Send urine for culture and sensitivity   Anxiety/bipolar disorder/depression Depakote level was subtherapeutic at 34.  Will continue Depakote, amitriptyline, risperidone. psychiatry was consulted.  Psychiatry seen and recommended involuntary commitment and  one-to-one sitter.  Patient has multiple attempts to leave [place] in the past and lacks insight and capacity.  History of dural venous sinus thrombosis Continue therapeutic Lovenox   History of PE Continue therapeutic Lovenox   Chronic lumbar radiculopathy -Continue gabapentin     DVT prophylaxis: Place TED hose Start: 02/13/22 0824   Code Status:     Code Status: Full Code  Disposition: Skilled nursing facility likely.  PT evaluation pending.  Status is: Observation  The patient will require care spanning > 2 midnights and should be moved to inpatient because: Multiple falls, need for skilled nursing facility placement, IV fluids   Family Communication:  None at bedside  Consultants:  Psychiatry  Procedures:  None  Antimicrobials:  None   Subjective: Today, patient was seen and examined at bedside.  Appears to be confused and disoriented.  Oriented to place and time.  Nursing staff at bedside states that she has been talking different things that were not making sense.  Has been on mittens.  Objective: Vitals:   02/13/22 1811 02/13/22 2308 02/14/22 0130 02/14/22 0500  BP: (!) 158/103 (!) 170/108 (!) 148/68 (!) 118/92  Pulse: 98 77 82 (!) 54  Resp: '20 20 20 18  '$ Temp: 98.4 F (36.9 C) (!) 97.5 F (36.4 C) 97.6 F (36.4 C) 98.6 F (37 C)  TempSrc: Axillary Oral Oral Oral  SpO2: 100% 99% 100% 98%  Weight:      Height:        Intake/Output Summary (Last 24 hours) at 02/14/2022 0809 Last data filed at 02/14/2022 0600 Gross per 24 hour  Intake 479.5 ml  Output 600 ml  Net -120.5 ml  Filed Weights   02/12/22 2337  Weight: 54.4 kg    Physical Examination: Body mass index is 18.79 kg/m.  General: Thinly built.  Not in obvious distress.  Confused, HENT:   No scleral pallor or icterus noted. Oral mucosa is moist.  Chest:  Clear breath sounds.  Diminished breath sounds bilaterally. No crackles or wheezes.  CVS: S1 &S2 heard. No murmur.   Regular rate and rhythm. Abdomen: Soft, nontender, nondistended.  Bowel sounds are heard.   Extremities: No cyanosis, clubbing or edema.  Peripheral pulses are palpable. Psych: Alert, awake, confused at times, oriented to place and month.  On mittens CNS:  No cranial nerve deficits.  Moves all extremities. Skin: Warm and dry.  No rashes noted.  Data Reviewed:   CBC: Recent Labs  Lab 02/11/22 1556 02/12/22 2348  WBC 6.9 7.3  NEUTROABS 5.0 4.0  HGB 12.8 12.7  HCT 40.0 40.0  MCV 92.0 92.6  PLT 259 672    Basic Metabolic Panel: Recent Labs  Lab 02/11/22 1556 02/12/22 2348  NA 143 143  K 3.3* 4.0  CL 107 107  CO2 25 26  GLUCOSE 125* 89  BUN 10 10  CREATININE 0.82 0.87  CALCIUM 8.8* 9.1  MG 2.0 2.0  PHOS 3.1  --     Liver Function Tests: Recent Labs  Lab 02/11/22 1556  AST 15  ALT 10  ALKPHOS 60  BILITOT 0.6  PROT 5.8*  ALBUMIN 3.5     Radiology Studies: ECHOCARDIOGRAM COMPLETE  Result Date: 02/13/2022    ECHOCARDIOGRAM REPORT   Patient Name:   [name] Date of Exam: 02/13/2022 Medical Rec #:  [id number]      Height:       67.0 in Accession #:    [id number]     Weight:       120.0 lb Date of Birth:  December 11, 1955      BSA:          1.628 m Patient Age:    52 years       BP:           152/101 mmHg Patient Gender: F              HR:           104 bpm. Exam Location:  Inpatient Procedure: 2D Echo, Color Doppler and Cardiac Doppler Indications:    syncope  History:        Patient has prior history of Echocardiogram examinations, most                 recent 05/04/2021. Pacemaker, Signs/Symptoms:Syncope; Risk                 Factors:Dyslipidemia and Current Smoker.  Sonographer:    Melissa Morford RDCS (AE, PE) Referring Phys: [id number] [facility]  1. Left ventricular ejection fraction, by estimation, is 60 to 65%. The left ventricle has normal function. The left ventricle has no regional wall motion abnormalities. Left ventricular diastolic parameters are  consistent with Grade I diastolic dysfunction (impaired relaxation).  2. Right ventricular systolic function is normal. The right ventricular size is normal. There is normal pulmonary artery systolic pressure. The estimated right ventricular systolic pressure is 65.0 mmHg.  3. The mitral valve is normal in structure. Trivial mitral valve regurgitation. No evidence of mitral stenosis.  4. The aortic valve is tricuspid. Aortic valve regurgitation is not visualized. No aortic stenosis is present.  5. The inferior vena  cava is normal in size with greater than 50% respiratory variability, suggesting right atrial pressure of 3 mmHg. FINDINGS  Left Ventricle: Left ventricular ejection fraction, by estimation, is 60 to 65%. The left ventricle has normal function. The left ventricle has no regional wall motion abnormalities. The left ventricular internal cavity size was normal in size. There is  no left ventricular hypertrophy. Left ventricular diastolic parameters are consistent with Grade I diastolic dysfunction (impaired relaxation). Right Ventricle: The right ventricular size is normal. No increase in right ventricular wall thickness. Right ventricular systolic function is normal. There is normal pulmonary artery systolic pressure. The tricuspid regurgitant velocity is 2.49 m/s, and  with an assumed right atrial pressure of 3 mmHg, the estimated right ventricular systolic pressure is 20.2 mmHg. Left Atrium: Left atrial size was normal in size. Right Atrium: Right atrial size was normal in size. Pericardium: There is no evidence of pericardial effusion. Mitral Valve: The mitral valve is normal in structure. Trivial mitral valve regurgitation. No evidence of mitral valve stenosis. Tricuspid Valve: The tricuspid valve is normal in structure. Tricuspid valve regurgitation is mild . No evidence of tricuspid stenosis. Aortic Valve: The aortic valve is tricuspid. Aortic valve regurgitation is not visualized. No aortic stenosis  is present. Pulmonic Valve: The pulmonic valve was normal in structure. Pulmonic valve regurgitation is trivial. No evidence of pulmonic stenosis. Aorta: The aortic root is normal in size and structure. Venous: The inferior vena cava is normal in size with greater than 50% respiratory variability, suggesting right atrial pressure of 3 mmHg. IAS/Shunts: No atrial level shunt detected by color flow Doppler. Additional Comments: A device lead is visualized in the right ventricle.  LEFT VENTRICLE PLAX 2D LVIDd:         3.50 cm     Diastology LVIDs:         2.60 cm     LV e' medial:    7.30 cm/s LV PW:         1.00 cm     LV E/e' medial:  7.7 LV IVS:        0.80 cm     LV e' lateral:   11.30 cm/s LVOT diam:     2.00 cm     LV E/e' lateral: 5.0 LV SV:         42 LV SV Index:   26 LVOT Area:     3.14 cm  LV Volumes (MOD) LV vol d, MOD A2C: 58.9 ml LV vol d, MOD A4C: 47.3 ml LV vol s, MOD A2C: 19.0 ml LV vol s, MOD A4C: 23.2 ml LV SV MOD A2C:     39.9 ml LV SV MOD A4C:     47.3 ml LV SV MOD BP:      33.1 ml RIGHT VENTRICLE RV S prime:     12.70 cm/s LEFT ATRIUM             Index        RIGHT ATRIUM           Index LA diam:        2.90 cm 1.78 cm/m   RA Area:     14.00 cm LA Vol (A2C):   33.2 ml 20.40 ml/m  RA Volume:   35.80 ml  22.00 ml/m LA Vol (A4C):   31.4 ml 19.29 ml/m LA Biplane Vol: 33.1 ml 20.34 ml/m  AORTIC VALVE LVOT Vmax:   99.10 cm/s LVOT Vmean:  59.300 cm/s LVOT VTI:  0.134 m  AORTA Ao Root diam: 2.90 cm Ao Asc diam:  2.90 cm MITRAL VALVE                TRICUSPID VALVE MV Area (PHT): 10.25 cm    TR Peak grad:   24.8 mmHg MV Decel Time: 74 msec      TR Vmax:        249.00 cm/s MV E velocity: 56.00 cm/s MV A velocity: 106.00 cm/s  SHUNTS MV E/A ratio:  0.53         Systemic VTI:  0.13 m                             Systemic Diam: 2.00 cm Dani Gobble Croitoru MD Electronically signed by Sanda Klein MD Signature Date/Time: 02/13/2022/11:32:05 AM    Final    CT Head Wo Contrast  Result Date:  02/12/2022 CLINICAL DATA:  Recent fall while on blood thinners, initial encounter EXAM: CT HEAD WITHOUT CONTRAST CT CERVICAL SPINE WITHOUT CONTRAST TECHNIQUE: Multidetector CT imaging of the head and cervical spine was performed following the standard protocol without intravenous contrast. Multiplanar CT image reconstructions of the cervical spine were also generated. RADIATION DOSE REDUCTION: This exam was performed according to the departmental dose-optimization program which includes automated exposure control, adjustment of the mA and/or kV according to patient size and/or use of iterative reconstruction technique. COMPARISON:  02/11/2022 FINDINGS: CT HEAD FINDINGS Brain: No evidence of acute infarction, hemorrhage, hydrocephalus, extra-axial collection or mass lesion/mass effect. Mild atrophic changes are noted. Vascular: No hyperdense vessel or unexpected calcification. Skull: Normal. Negative for fracture or focal lesion. Sinuses/Orbits: No acute finding. Other: None. CT CERVICAL SPINE FINDINGS Alignment: Within normal limits. Skull base and vertebrae: 7 cervical segments are well visualized. Vertebral body height is well maintained. Changes of prior fusion at C4-5 and C5-6 are noted with anterior fixation. No acute fracture or acute facet abnormality is noted. Mild facet hypertrophic changes are seen. Mild osteophytic changes are noted most prominent at C6-7. Soft tissues and spinal canal: Surrounding soft tissue structures are within normal limits. Upper chest: Visualized lung apices are unremarkable. Other: None IMPRESSION: CT of the head: No acute intracranial abnormality noted. CT of the cervical spine: Mild degenerative and postoperative changes without acute abnormality. Electronically Signed   By: Inez Catalina M.D.   On: 02/12/2022 23:48   CT Cervical Spine Wo Contrast  Result Date: 02/12/2022 CLINICAL DATA:  Recent fall while on blood thinners, initial encounter EXAM: CT HEAD WITHOUT CONTRAST  CT CERVICAL SPINE WITHOUT CONTRAST TECHNIQUE: Multidetector CT imaging of the head and cervical spine was performed following the standard protocol without intravenous contrast. Multiplanar CT image reconstructions of the cervical spine were also generated. RADIATION DOSE REDUCTION: This exam was performed according to the departmental dose-optimization program which includes automated exposure control, adjustment of the mA and/or kV according to patient size and/or use of iterative reconstruction technique. COMPARISON:  02/11/2022 FINDINGS: CT HEAD FINDINGS Brain: No evidence of acute infarction, hemorrhage, hydrocephalus, extra-axial collection or mass lesion/mass effect. Mild atrophic changes are noted. Vascular: No hyperdense vessel or unexpected calcification. Skull: Normal. Negative for fracture or focal lesion. Sinuses/Orbits: No acute finding. Other: None. CT CERVICAL SPINE FINDINGS Alignment: Within normal limits. Skull base and vertebrae: 7 cervical segments are well visualized. Vertebral body height is well maintained. Changes of prior fusion at C4-5 and C5-6 are noted with anterior fixation. No acute fracture or acute facet  abnormality is noted. Mild facet hypertrophic changes are seen. Mild osteophytic changes are noted most prominent at C6-7. Soft tissues and spinal canal: Surrounding soft tissue structures are within normal limits. Upper chest: Visualized lung apices are unremarkable. Other: None IMPRESSION: CT of the head: No acute intracranial abnormality noted. CT of the cervical spine: Mild degenerative and postoperative changes without acute abnormality. Electronically Signed   By: Inez Catalina M.D.   On: 02/12/2022 23:48      LOS: 0 days    Flora Lipps, MD [facility] Available via Epic secure chat 7am-7pm After these hours, please refer to coverage provider listed on [email or web address] 02/14/2022, 8:09 AM

## 2022-02-14 NOTE — Evaluation (Signed)
Physical Therapy Evaluation Patient Details Name: [name] MRN: [id number] DOB: 12-Oct-1955 Today's Date: 02/14/2022  History of Present Illness  [name] is a 66 y.o. female presented to the ED due to multiple recent falls.  Family requested nursing home placement due to patient having episodes of confusion and aggressive behavior.  In the ED, patient had severe orthostatic hypotension with blood pressure dropping from 153/97 supine to 86/62 standing.  No significant lab abnormalities except Depakote level subtherapeutic.  Family told RN that patient has not been taking her Depakote as prescribed.  In addition, she has not taken Lovenox for 3 days due to prescription running out.  CT head and C-spine negative for acute finding.  Clinical Impression   Pt admitted with above diagnosis. Lives at home with daughter/family; Per chart review, pt was managing independently earlier this year; Presents to PT with confusion, incr fall risk, orthostatic hypotension;  Needs Mod assist, heavy mod at times, to come to sit eOB and to stand; unable to make attempts at amb today due to BP drop; Pt currently with functional limitations due to the deficits listed below (see PT Problem List). Pt will benefit from skilled PT to increase their independence and safety with mobility to allow discharge to the venue listed below.           Recommendations for follow up therapy are one component of a multi-disciplinary discharge planning process, led by the attending physician.  Recommendations may be updated based on patient status, additional functional criteria and insurance authorization.  Follow Up Recommendations Skilled nursing-short term rehab (<3 hours/day) Can patient physically be transported by private vehicle: No (but perhaps soon when BPs with activity are stabilized)    Assistance Recommended at Discharge Frequent or constant Supervision/Assistance  Patient can return home with the following   Two people to help with walking and/or transfers;Assistance with cooking/housework;Help with stairs or ramp for entrance;Assist for transportation    Equipment Recommendations Rollator (4 wheels);BSC/3in1  Recommendations for Other Services       Functional Status Assessment Patient has had a recent decline in their functional status and demonstrates the ability to make significant improvements in function in a reasonable and predictable amount of time.     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) Precaution Comments: Orthostatic hypotension; AMS 11/29 Restrictions Weight Bearing Restrictions: No      Mobility  Bed Mobility Overal bed mobility: Needs Assistance Bed Mobility: Supine to Sit     Supine to sit: Mod assist     General bed mobility comments: Heavy mod assist overall, handheld assist to pull to sit, and downward pressure through knees to help with weight shift forward to sit up    Transfers Overall transfer level: Needs assistance Equipment used: 1 person hand held assist Transfers: Sit to/from Stand Sit to Stand: Mod assist           General transfer comment: tactile cueing and handheld assist to "lead" pt into aterior weight shift over feet, and mod assist to power up    Ambulation/Gait               General Gait Details: Pt sat back to bed before able to make attemtps at march in place or sidesteps at [employer]    Modified Rankin (Stroke Patients Only)       Balance Overall balance assessment: Needs assistance  Sitting-balance support: Bilateral upper extremity supported, Feet supported Sitting balance-Leahy Scale: Poor Sitting balance - Comments: drifts into posterior lean (and feet rise from teh floor) without support   Standing balance support: Bilateral upper extremity supported Standing balance-Leahy Scale: Poor Standing balance comment: Depends on  UE support                             Pertinent Vitals/Pain Pain Assessment Pain Assessment: Faces Faces Pain Scale: Hurts a little bit Pain Location: Generalized with mobility Pain Descriptors / Indicators: Grimacing Pain Intervention(s): Monitored during session    Home Living Family/patient expects to be discharged to:: Private residence Living Arrangements: Other (Comment) (Per chart review, as of 04/2021, living with family) Available Help at Discharge: Family;Available 24 hours/day Type of Home: Apartment Home Access: Stairs to enter Entrance Stairs-Rails: Right Entrance Stairs-Number of Steps: flight   Home Layout: One level   Additional Comments: Pt unable to give PLOF/home setup; Above info gleaned from chart review and acurate as of Feb of this year -- will need to be verified    Prior Function Prior Level of Function : Independent/Modified Independent (until events leading to this admission)                     Hand Dominance   Dominant Hand: Right    Extremity/Trunk Assessment   Upper Extremity Assessment Upper Extremity Assessment: Defer to OT evaluation    Lower Extremity Assessment Lower Extremity Assessment: Generalized weakness    Cervical / Trunk Assessment Cervical / Trunk Assessment: Other exceptions Cervical / Trunk Exceptions: Cachectic  Communication   Communication: Other (comment) (Confused)  Cognition Arousal/Alertness: Awake/alert Behavior During Therapy: WFL for tasks assessed/performed Overall Cognitive Status: Impaired/Different from baseline Area of Impairment: Orientation, Attention, Memory, Following commands, Safety/judgement, Awareness, Problem solving                 Orientation Level: Disoriented to, Place, Time, Situation Current Attention Level: Sustained   Following Commands: Follows one step commands inconsistently Safety/Judgement: Decreased awareness of safety, Decreased awareness of  deficits   Problem Solving: Slow processing, Decreased initiation General Comments: Pleasantly confused        General Comments General comments (skin integrity, edema, etc.):   02/14/22 1300  Orthostatic Lying   BP- Lying (!) 110/91  Pulse- Lying 90  Orthostatic Sitting  BP- Sitting 105/80  Pulse- Sitting 108  Orthostatic Standing at 0 minutes  BP- Standing at 0 minutes (!) 80/63  Pulse- Standing at 0 minutes 126  Orthostatic Standing at 3 minutes  BP- Standing at 3 minutes  (Unable to stand long enough)       Exercises     Assessment/Plan    PT Assessment Patient needs continued PT services  PT Problem List Decreased strength;Decreased activity tolerance;Decreased balance;Decreased mobility;Decreased coordination;Decreased cognition;Decreased knowledge of use of DME;Decreased safety awareness;Decreased knowledge of precautions;Cardiopulmonary status limiting activity       PT Treatment Interventions DME instruction;Gait training;Stair training;Functional mobility training;Therapeutic activities;Therapeutic exercise;Balance training;Neuromuscular re-education;Cognitive remediation;Patient/family education    PT Goals (Current goals can be found in the Care Plan section)  Acute Rehab PT Goals Patient Stated Goal: Unable to state PT Goal Formulation: Patient unable to participate in goal setting Time For Goal Achievement: 02/28/22 Potential to Achieve Goals: Good    Frequency Min 2X/week     Co-evaluation               AM-PAC  PT "6 Clicks" Mobility  Outcome Measure Help needed turning from your back to your side while in a flat bed without using bedrails?: None Help needed moving from lying on your back to sitting on the side of a flat bed without using bedrails?: A Lot Help needed moving to and from a bed to a chair (including a wheelchair)?: A Lot Help needed standing up from a chair using your arms (e.g., wheelchair or bedside chair)?: A Lot Help needed  to walk in hospital room?: Total Help needed climbing 3-5 steps with a railing? : Total 6 Click Score: 12    End of Session   Activity Tolerance: Patient tolerated treatment well Patient left: in bed;with call bell/phone within reach;with bed alarm set;with nursing/sitter in room Nurse Communication: Mobility status PT Visit Diagnosis: Unsteadiness on feet (R26.81);Other abnormalities of gait and mobility (R26.89)    Time: [phone] (in and out times are approximate) PT Time Calculation (min) (ACUTE ONLY): 17 min   Charges:   PT Evaluation $PT Eval Moderate Complexity: [place], [place] Office [phone]   Colletta [place] 02/14/2022, 3:35 PM

## 2022-02-14 NOTE — Consult Note (Signed)
[facility] Consult   Reason for Consult: Patient with anxiety/bipolar disorder/depression and family concerned about aggressive behavior.  Referring Physician: Dr. Marlowe Sax Patient Identification: [name] MRN:  [id number] Principal Diagnosis: Orthostatic hypotension Diagnosis:  Principal Problem:   Orthostatic hypotension Active Problems:   Syncope   Bipolar disorder (HCC)   Lumbar radiculopathy   Depression with anxiety   History of pulmonary embolism   Cerebral venous thrombosis of sigmoid sinus   Lower abdominal pain   Total Time spent with patient: 45 minutes  Subjective:   [name] is a 66 y.o. female   patient remains alert and oriented x 2.  She tells me today is Thanksgiving day.  When asked the patient what brought her into the hospital she reports "it is holiday.  That is why I am here ".  Patient remains confused and continues to have difficulty linking words in close because, despite pointing a calendar and room she is unable to connect information with date.  However her conversation today has improved is more linear, and her rhythm of speech is more fluent.  When assessing for orientation question patient is unable to answer any questions at this time.  Patient is delusional at this, however improved from yesterday. Nursing reports that she  did not sleep well last night, and finally rested around 4am. Patient was drowsy however easy to awake, and participate in evaluate. She denies any hallucinations, psychosis at this time. She does not appear to be responding to any internal stimuli, external stimuli at this time. While she does remain with delusional thought disorder, it has improved. Will continue with current medications at this time Risperdal '3mg'$  and Depakote '250mg'$ .   Patient does have multiple underlying psychiatric conditions, and is currently being managed outpatient by Donnal Moat.  Patient does have a history of bipolar 1 disorder,  depression, anxiety.  Currently being managed with amitriptyline, valproic acid, hydroxyzine, Risperdal.     HPI:   [name] is a 66 y.o. 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  Family requested nursing home placement due to patient having episodes of confusion and aggressive behavior.  In the ED, patient had severe orthostatic hypotension with blood pressure dropping from 153/97 supine to 86/62 standing.  No significant lab abnormalities except Depakote level subtherapeutic.  Family told RN that patient has not been taking her Depakote as prescribed.  In addition, she has not taken Lovenox for 3 days due to prescription running out.  CT head and C-spine negative for acute finding. Patient was given Haldol, Zofran, and 1 L normal saline bolus.  TRH called to admit.   Past Psychiatric History: Anxiety, Bipolar disorder, and depression.  Previously takes Xanax 1 mg p.o. 3 times daily as needed., meds recently discontinued for suspected diversion.  Also takes Depakote, Gabapentin, Hydroxyzine, Risperidone at home.  Patient is currently under the services of Donnal Moat at [facility], she is also receiving therapy through the same office. Last OV 02/05/2022.   Risk to Self: Denies Risk to Others: Denies Prior Inpatient Therapy: Denies Prior Outpatient Therapy: [facility].  Past Medical History:  Past Medical History:  Diagnosis Date   Anxiety    anxiety and mild depressive order systoms   Bipolar disorder ([place])     (  02/20/2013)   Cervical cancer ([place]) 03/19/1980   Cholelithiasis 11/11/2021   Depression    Dysrhythmia    Headache(784.0)    "monthly" (02/20/2013)   Hyperlipidemia 11/11/2021    Pacemaker    medtronic   PONV (postoperative nausea and vomiting)    Sinus pause 02/14/2013   Syncope and collapse    echo-April 12,2012-EF 55% Echo normal; recorder with prolonged sinus pauses --> status post   Tobacco abuse    Vertigo     Past Surgical History:  Procedure Laterality Date   ABDOMINAL HYSTERECTOMY  03/19/1980   "partial"   BACK SURGERY     CERVICAL FUSION Left 11/2013   C4-C6   CERVICAL FUSION     3,4,5,   INSERT / REPLACE / REMOVE PACEMAKER  02/20/2013   medtronic   LOOP RECORDER IMPLANT N/A 11/06/2012   Procedure: LINQ LOOP RECORDER IMPLANT;  Surgeon: Sanda Klein, MD;  Location: [facility];  Service: Cardiovascular;  Laterality: N/A;   NECK SURGERY  11/17/2013   PACEMAKER PLACEMENT N/A 02/16/2013   PERMANENT PACEMAKER INSERTION N/A 02/20/2013   Procedure: PERMANENT PACEMAKER INSERTION;  Surgeon: Sanda Klein, MD;  Location: [facility];  Service: Cardiovascular;  Laterality: N/A;   POSTERIOR LUMBAR FUSION  ~ 2009   ROBOTIC ASSISTED SALPINGO OOPHERECTOMY Bilateral 11/25/2018   Procedure: XI ROBOTIC ASSISTED BILATERAL SALPINGO OOPHORECTOMY and LYSIS OF ADHESIONS.;  Surgeon: Everitt Amber, MD;  Location: [facility];  Service: Gynecology;  Laterality: Bilateral;   SPINAL FUSION  03/19/2014   TONSILLECTOMY     TRANSTHORACIC ECHOCARDIOGRAM  07/11/2010   The left atrial size is normal.There is no evidence of mitral vavle prolaspe.Right ventriicular systolic pressure is normal . Injection of contrast documented no interatrial shunt. Essentially normal 2D echo -doppler study    Family History:  Family History  Adopted: Yes  Problem Relation Age of Onset   Heart attack Brother    Family Psychiatric  History: Unable to assess Social History:  Social History   Substance and Sexual Activity  Alcohol Use No     Social History   Substance and Sexual Activity  Drug Use No    Social History   Socioeconomic History   Marital status: Widowed    Spouse name: Not on file    Number of children: 3   Years of education: 23   Highest education level: Not on file  Occupational History   Not on file  Tobacco Use   Smoking status: Every Day    Packs/day: 0.50    Years: 40.00    Total pack years: 20.00    Types: Cigarettes   Smokeless tobacco: Never  Vaping Use   Vaping Use: Never used  Substance and Sexual Activity   Alcohol use: No   Drug use: No   Sexual activity: Yes  Other Topics Concern   Not on file  Social History Narrative   Married 29 years . Has 3 children. Current smoker -1 pack a day-for 36 years .Alcohol :  1 beer on occasion.   She is soon to be a grandmother.  His before to going on a trip up to the [place] to be closer to family when the baby is born.  She is extremely excited.   She very much would like to move back up to that area to be closer to family, but the whether is it just too cold in the winter.   Right handed    Soda daily  Lives alone   Social Determinants of Health   Financial Resource Strain: Low Risk  (10/11/2020)   Overall Financial Resource Strain (CARDIA)    Difficulty of Paying Living Expenses: Not hard at all  Food Insecurity: No Food Insecurity (11/22/2021)   Hunger Vital Sign    Worried About Running Out of Food in the Last Year: Never true    Ran Out of Food in the Last Year: Never true  Transportation Needs: No Transportation Needs (11/22/2021)   PRAPARE - Hydrologist (Medical): No    Lack of Transportation (Non-Medical): No  Physical Activity: Not on file  Stress: No Stress Concern Present (10/11/2020)   [place]    Feeling of Stress : Only a little  Social Connections: Not on file   Additional Social History:    Allergies:   Allergies  Allergen Reactions   Penicillins Other (See Comments)    Yeast infection Did it involve swelling of the face/tongue/throat, SOB, or low BP? No Did it  involve sudden or severe rash/hives, skin peeling, or any reaction on the inside of your mouth or nose? No Did you need to seek medical attention at a hospital or doctor's office? Yes When did it last happen? More than 5 years ago  If all above answers are "NO", may proceed with cephalosporin use.     Labs:  Results for orders placed or performed during the hospital encounter of 02/12/22 (from the past 48 hour(s))  Basic metabolic panel     Status: None   Collection Time: 02/12/22 11:48 PM  Result Value Ref Range   Sodium 143 135 - 145 mmol/L   Potassium 4.0 3.5 - 5.1 mmol/L   Chloride 107 98 - 111 mmol/L   CO2 26 22 - 32 mmol/L   Glucose, Bld 89 70 - 99 mg/dL    Comment: Glucose reference range applies only to samples taken after fasting for at least 8 hours.   BUN 10 8 - 23 mg/dL   Creatinine, Ser 0.87 0.44 - 1.00 mg/dL   Calcium 9.1 8.9 - 10.3 mg/dL   GFR, Estimated >60 >60 mL/min    Comment: (NOTE) Calculated using the CKD-EPI Creatinine Equation (2021)    Anion gap 10 5 - 15    Comment: Performed at [facility] [address] [place], [place] [address]  CBC with Differential     Status: None   Collection Time: 02/12/22 11:48 PM  Result Value Ref Range   WBC 7.3 4.0 - 10.5 K/uL   RBC 4.32 3.87 - 5.11 MIL/uL   Hemoglobin 12.7 12.0 - 15.0 g/dL   HCT 40.0 36.0 - 46.0 %   MCV 92.6 80.0 - 100.0 fL   MCH 29.4 26.0 - 34.0 pg   MCHC 31.8 30.0 - 36.0 g/dL   RDW 13.6 11.5 - 15.5 %   Platelets 250 150 - 400 K/uL   nRBC 0.0 0.0 - 0.2 %   Neutrophils Relative % 55 %   Neutro Abs 4.0 1.7 - 7.7 K/uL   Lymphocytes Relative 32 %   Lymphs Abs 2.3 0.7 - 4.0 K/uL   Monocytes Relative 10 %   Monocytes Absolute 0.7 0.1 - 1.0 K/uL   Eosinophils Relative 2 %   Eosinophils Absolute 0.2 0.0 - 0.5 K/uL   Basophils Relative 1 %   Basophils Absolute 0.1 0.0 - 0.1 K/uL   Immature Granulocytes 0 %  Abs Immature Granulocytes 0.02 0.00 - 0.07 K/uL    Comment: Performed at [facility], [place] [address] [place], [place] [address]  Magnesium     Status: None   Collection Time: 02/12/22 11:48 PM  Result Value Ref Range   Magnesium 2.0 1.7 - 2.4 mg/dL    Comment: Performed at [facility], [place] [address], [place], [place] [address]  TSH     Status: None   Collection Time: 02/12/22 11:48 PM  Result Value Ref Range   TSH 1.260 0.350 - 4.500 uIU/mL    Comment: Performed by a 3rd Generation assay with a functional sensitivity of <=0.01 uIU/mL. Performed at [facility], [place] [address], [place], [place] [address]   Cortisol     Status: None   Collection Time: 02/12/22 11:48 PM  Result Value Ref Range   Cortisol, Plasma 10.7 ug/dL    Comment: (NOTE) AM    6.7 - 22.6 ug/dL PM   <10.0       ug/dL Performed at [facility] [address], [place], [place] [address]   Valproic acid level     Status: Abnormal   Collection Time: 02/13/22  1:47 AM  Result Value Ref Range   Valproic Acid Lvl 34 (L) 50.0 - 100.0 ug/mL    Comment: Performed at [facility] [address], [place], [place] [address]  Urinalysis, Routine w reflex microscopic     Status: Abnormal   Collection Time: 02/13/22  5:40 PM  Result Value Ref Range   Color, Urine YELLOW YELLOW   APPearance CLOUDY (A) CLEAR   Specific Gravity, Urine 1.010 1.005 - 1.030   pH 8.0 5.0 - 8.0   Glucose, UA NEGATIVE NEGATIVE mg/dL   Hgb urine dipstick SMALL (A) NEGATIVE   Bilirubin Urine NEGATIVE NEGATIVE   Ketones, ur 5 (A) NEGATIVE mg/dL   Protein, ur NEGATIVE NEGATIVE mg/dL   Nitrite NEGATIVE NEGATIVE   Leukocytes,Ua TRACE (A) NEGATIVE   RBC / HPF 11-20 0 - 5 RBC/hpf   WBC, UA 6-10 0 - 5 WBC/hpf   Bacteria, UA FEW (A) NONE SEEN   Squamous Epithelial / LPF 0-5 0 - 5   Mucus PRESENT    Budding Yeast PRESENT     Comment: Performed at [facility], [place] [address], [place], [place] [address]  Magnesium     Status: None   Collection Time: 02/14/22  8:00 AM  Result Value Ref Range    Magnesium 1.9 1.7 - 2.4 mg/dL    Comment: Performed at [facility], [place] [address] [place], [place] [address]  CBC with Differential/Platelet     Status: None   Collection Time: 02/14/22  8:00 AM  Result Value Ref Range   WBC 6.6 4.0 - 10.5 K/uL   RBC 4.35 3.87 - 5.11 MIL/uL   Hemoglobin 12.7 12.0 - 15.0 g/dL   HCT 39.4 36.0 - 46.0 %   MCV 90.6 80.0 - 100.0 fL   MCH 29.2 26.0 - 34.0 pg   MCHC 32.2 30.0 - 36.0 g/dL   RDW 13.2 11.5 - 15.5 %   Platelets 231 150 - 400 K/uL   nRBC 0.0 0.0 - 0.2 %   Neutrophils Relative % 53 %   Neutro Abs 3.5 1.7 - 7.7 K/uL   Lymphocytes Relative 35 %   Lymphs Abs 2.3 0.7 - 4.0 K/uL   Monocytes Relative 9 %   Monocytes  Absolute 0.6 0.1 - 1.0 K/uL   Eosinophils Relative 2 %   Eosinophils Absolute 0.1 0.0 - 0.5 K/uL   Basophils Relative 1 %   Basophils Absolute 0.1 0.0 - 0.1 K/uL   Immature Granulocytes 0 %   Abs Immature Granulocytes 0.02 0.00 - 0.07 K/uL    Comment: Performed at [facility] [address], [place], [place] [address]  Comprehensive metabolic panel     Status: Abnormal   Collection Time: 02/14/22  8:00 AM  Result Value Ref Range   Sodium 133 (L) 135 - 145 mmol/L    Comment: DELTA CHECK NOTED   Potassium 3.9 3.5 - 5.1 mmol/L   Chloride 101 98 - 111 mmol/L   CO2 23 22 - 32 mmol/L   Glucose, Bld 87 70 - 99 mg/dL    Comment: Glucose reference range applies only to samples taken after fasting for at least 8 hours.   BUN 7 (L) 8 - 23 mg/dL   Creatinine, Ser 0.79 0.44 - 1.00 mg/dL   Calcium 8.4 (L) 8.9 - 10.3 mg/dL   Total Protein 5.5 (L) 6.5 - 8.1 g/dL   Albumin 3.1 (L) 3.5 - 5.0 g/dL   AST 12 (L) 15 - 41 U/L   ALT 10 0 - 44 U/L   Alkaline Phosphatase 66 38 - 126 U/L   Total Bilirubin 1.3 (H) 0.3 - 1.2 mg/dL   GFR, Estimated >60 >60 mL/min    Comment: (NOTE) Calculated using the CKD-EPI Creatinine Equation (2021)    Anion gap 9 5 - 15    Comment: Performed at [facility], [place] [address] [place],  [place] [address]  Brain natriuretic peptide     Status: None   Collection Time: 02/14/22  8:00 AM  Result Value Ref Range   B Natriuretic Peptide 53.4 0.0 - 100.0 pg/mL    Comment: Performed at [facility] [address] [place], [place] [address]    Current Facility-Administered Medications  Medication Dose Route Frequency Provider Last Rate Last Admin   0.9 %  sodium chloride infusion   Intravenous Continuous Pokhrel, Laxman, MD       acetaminophen (TYLENOL) tablet 650 mg  650 mg Oral Q6H PRN Shela Leff, MD       Or   acetaminophen (TYLENOL) suppository 650 mg  650 mg Rectal Q6H PRN Shela Leff, MD       amitriptyline (ELAVIL) tablet 50 mg  50 mg Oral QHS Shela Leff, MD   50 mg at 02/13/22 2311   carvedilol (COREG) tablet 3.125 mg  3.125 mg Oral BID WC Thurnell Lose, MD   3.125 mg at 02/14/22 7829   cefTRIAXone (ROCEPHIN) 1 g in sodium chloride 0.9 % 100 mL IVPB  1 g Intravenous Q24H Pokhrel, Laxman, MD 200 mL/hr at 02/14/22 1551 Infusion Verify at 02/14/22 1551   Chlorhexidine Gluconate Cloth 2 % PADS 6 each  6 each Topical Daily Shela Leff, MD       enoxaparin (LOVENOX) injection 50 mg  50 mg Subcutaneous BID Shela Leff, MD   50 mg at 02/14/22 5621   gabapentin (NEURONTIN) capsule 100 mg  100 mg Oral BID Shela Leff, MD   100 mg at 02/14/22 3086   haloperidol lactate (HALDOL) injection 2 mg  2 mg Intramuscular Q6H PRN Thurnell Lose, MD   2 mg at 02/13/22 1145   hydrALAZINE (APRESOLINE) injection 10 mg  10 mg Intravenous Q6H PRN Lala Lund  K, MD       midodrine (PROAMATINE) tablet 2.5 mg  2.5 mg Oral TID WC Shela Leff, MD   2.5 mg at 02/14/22 1300   Oral care mouth rinse  15 mL Mouth Rinse PRN Shela Leff, MD       Oral care mouth rinse  15 mL Mouth Rinse 4 times per day Shela Leff, MD       Oral care mouth rinse  15 mL Mouth Rinse PRN Shela Leff, MD       risperiDONE (RISPERDAL) tablet 3 mg  3 mg  Oral QHS Shela Leff, MD   3 mg at 02/13/22 2311   valproate (DEPACON) 250 mg in dextrose 5 % 50 mL IVPB  250 mg Intravenous Q8H Suella Broad, FNP   Stopped at 02/14/22 1511    Musculoskeletal: Strength & Muscle Tone: decreased Gait & Station: unsteady Patient leans: N/A            Psychiatric Specialty Exam:  Presentation  General Appearance: Appropriate for Environment; Casual  Eye Contact:Good  Speech:Clear and Coherent; Normal Rate  Speech Volume:Normal  Handedness:Right   Mood and Affect  Mood:irritable  Affect:Appropriate; Congruent   Thought Process  Thought Processes:Irrevelant  Descriptions of Associations:Loose  Orientation:Partial  Thought Content:Logical  History of Schizophrenia/Schizoaffective disorder:No data recorded Duration of Psychotic Symptoms:No data recorded Hallucinations:No data recorded  Ideas of Reference:None  Suicidal Thoughts:No data recorded  Homicidal Thoughts:No data recorded   Sensorium  Memory:Immediate Fair; Recent Poor; Remote Poor  Judgment:Poor  Insight:Lacking   Executive Functions  Concentration:Fair  Attention Span:Fair  Recall:Poor  Fund of Knowledge:Poor  Language:Fair   Psychomotor Activity  Psychomotor Activity:No data recorded   Assets  Assets:Communication Skills; Desire for Improvement; Financial Resources/Insurance; Physical Health; Leisure Time; Social Support   Sleep  Sleep:No data recorded  Physical Exam: Physical Exam Vitals and nursing note reviewed.  Constitutional:      Appearance: Normal appearance. She is normal weight.  Neurological:     Mental Status: She is lethargic, disoriented and confused.     Motor: Motor function is intact.     Coordination: Coordination is intact.  Psychiatric:        Attention and Perception: She is inattentive.        Mood and Affect: Affect is blunt.        Speech: Speech normal.        Behavior: Behavior  normal. Behavior is cooperative.        Thought Content: Thought content is delusional (intermittment).        Cognition and Memory: Cognition is impaired. Memory is impaired. She exhibits impaired recent memory.    ROS Blood pressure (!) 155/94, pulse 87, temperature 97.8 F (36.6 C), temperature source Oral, resp. rate 18, height '5\' 7"'$  (1.702 m), weight 54.4 kg, SpO2 98 %. Body mass index is 18.79 kg/m.  Treatment Plan Summary: Daily contact with patient to assess and evaluate symptoms and progress in treatment, Medication management, and Plan   -Psychiatry will continue to follow daily. -Will resume home meds.  -Last EKG obtained 11/27, QTC of 457. Patient with multiple risk factors for QTc prolongation to include cardiac abnormalities, female, age >27 and electrolyte abnormalities.  Psychotropic medication will likely cause worsening QTC prolongation.  As needed agitation protocol has been ordered.   Will limit as needed psychotropic medication such as Haldol, Geodon, Zyprexa.  As of this note patient has not received any additional as needed Haldol at this time.  -  Patient has outpatient appointment scheduled with neurology, however will recommend she pursue this appointment if discharged by Monday.   -Patient has not received any oxycodone and or outpatient Xanax, see provider documentation by outpatient psychiatry.   -Patient is at risk for developing delirium, recommend initiating delirium precautions to include safety sitter at night. Continue IVC, and Air cabin crew.  Disposition: No evidence of imminent risk to self or others at present.   Patient does not meet criteria for psychiatric inpatient admission. Supportive therapy provided about ongoing stressors.  Suella Broad, FNP 02/14/2022 4:40 PM

## 2022-02-15 ENCOUNTER — Institutional Professional Consult (permissible substitution): Payer: Medicare Other | Admitting: Neurology

## 2022-02-15 DIAGNOSIS — Z86711 Personal history of pulmonary embolism: Secondary | ICD-10-CM | POA: Diagnosis not present

## 2022-02-15 DIAGNOSIS — G08 Intracranial and intraspinal phlebitis and thrombophlebitis: Secondary | ICD-10-CM | POA: Diagnosis not present

## 2022-02-15 DIAGNOSIS — I951 Orthostatic hypotension: Secondary | ICD-10-CM | POA: Diagnosis not present

## 2022-02-15 DIAGNOSIS — F319 Bipolar disorder, unspecified: Secondary | ICD-10-CM | POA: Diagnosis not present

## 2022-02-15 LAB — COMPREHENSIVE METABOLIC PANEL
ALT: 8 U/L (ref 0–44)
AST: 10 U/L — ABNORMAL LOW (ref 15–41)
Albumin: 2.8 g/dL — ABNORMAL LOW (ref 3.5–5.0)
Alkaline Phosphatase: 58 U/L (ref 38–126)
Anion gap: 10 (ref 5–15)
BUN: 10 mg/dL (ref 8–23)
CO2: 22 mmol/L (ref 22–32)
Calcium: 8.5 mg/dL — ABNORMAL LOW (ref 8.9–10.3)
Chloride: 106 mmol/L (ref 98–111)
Creatinine, Ser: 0.9 mg/dL (ref 0.44–1.00)
GFR, Estimated: >60 mL/min (ref >60)
Glucose, Bld: 75 mg/dL (ref 70–99)
Potassium: 4 mmol/L (ref 3.5–5.1)
Sodium: 138 mmol/L (ref 135–145)
Total Bilirubin: 1 mg/dL (ref 0.3–1.2)
Total Protein: 5.1 g/dL — ABNORMAL LOW (ref 6.5–8.1)

## 2022-02-15 LAB — CBC WITH DIFFERENTIAL/PLATELET
Abs Immature Granulocytes: 0.01 10*3/uL (ref 0.00–0.07)
Basophils Absolute: 0.1 10*3/uL (ref 0.0–0.1)
Basophils Relative: 1 %
Eosinophils Absolute: 0.1 10*3/uL (ref 0.0–0.5)
Eosinophils Relative: 2 %
HCT: 37.8 % (ref 36.0–46.0)
Hemoglobin: 12.5 g/dL (ref 12.0–15.0)
Immature Granulocytes: 0 %
Lymphocytes Relative: 50 %
Lymphs Abs: 2.7 10*3/uL (ref 0.7–4.0)
MCH: 29.8 pg (ref 26.0–34.0)
MCHC: 33.1 g/dL (ref 30.0–36.0)
MCV: 90.2 fL (ref 80.0–100.0)
Monocytes Absolute: 0.5 10*3/uL (ref 0.1–1.0)
Monocytes Relative: 8 %
Neutro Abs: 2.2 10*3/uL (ref 1.7–7.7)
Neutrophils Relative %: 39 %
Platelets: 251 10*3/uL (ref 150–400)
RBC: 4.19 MIL/uL (ref 3.87–5.11)
RDW: 13.3 % (ref 11.5–15.5)
WBC: 5.6 10*3/uL (ref 4.0–10.5)
nRBC: 0 % (ref 0.0–0.2)

## 2022-02-15 LAB — BRAIN NATRIURETIC PEPTIDE: B Natriuretic Peptide: 19.8 pg/mL (ref 0.0–100.0)

## 2022-02-15 LAB — MAGNESIUM: Magnesium: 1.9 mg/dL (ref 1.7–2.4)

## 2022-02-15 MED ORDER — DIVALPROEX SODIUM 250 MG PO DR TAB
250.0000 mg | DELAYED_RELEASE_TABLET | Freq: Three times a day (TID) | ORAL | Status: DC
  Administered 2022-02-15 – 2022-02-23 (×24): 250 mg via ORAL
  Filled 2022-02-15 (×26): qty 1

## 2022-02-15 NOTE — Plan of Care (Signed)
  Problem: Safety: Goal: Non-violent Restraint(s) Outcome: Progressing   

## 2022-02-15 NOTE — Progress Notes (Signed)
PROGRESS NOTE    CURSTIN SCHMALE  CHE:[id number] DOB: 02-25-1956 DOA: 02/12/2022 PCP: Lujean Amel, MD    Brief Narrative:  [name] is a 66 y.o. 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  She was watching TV and when she was trying to go to the bathroom she felt dizzy and blacked out.  Family had requested nursing home placement due to confusion and aggressive behavior.  In the ED, patient was noted to be orthostatic.  Laboratory data was unremarkable.  Patient was not taking her Depakote as prescribed and also Lovenox for 3 days.  CT head and C-spine negative for acute finding.  Patient does have history of recurrent falls recently.  Patient was then considered for admission to the hospital for further evaluation and treatment.   Assessment and Plan:  Syncope likely secondary to severe orthostatic hypotension Not on antihypertensives or diuretics.   Continue midodrine, compression stockings, fall precautions.  History of sinus pause status post pacemaker.  No aortic stenosis on previous echocardiogram done in February 2023.  TSH of 1.2.  Physical therapy has recommended skilled nursing facility on discharge.  Continue IV fluids at normal saline 75 mill per hour and reassess volume status in AM.  History of seizures. On Depakote for that . Will continue.   Lower abdominal pain and urinary symptoms Improved.  Urinalysis with trace leukocytes negative nitrates but 6-10 white cells.  Add Rocephin IV.  Patient has required a Foley catheter at this time.  She was supposed to follow-up with urology in December due to her bladder dysfunction and recurrent UTI.  Urine culture and sensitivity.  Do voiding  trial prior to discharge.   Anxiety/bipolar disorder/depression Depakote level was subtherapeutic at 34.  Will continue Depakote, amitriptyline, risperidone.  Currently on board.Marland Kitchen  Psychiatry seen and recommended involuntary commitment and one-to-one sitter.  Patient has multiple attempts to leave [facility] in the past and lacks insight and capacity.  History of dural venous sinus thrombosis/PE. Continue therapeutic Lovenox     Chronic lumbar radiculopathy -Continue gabapentin     DVT prophylaxis: Place TED hose Start: 02/13/22 0824   Code Status:     Code Status: Full Code  Disposition: Skilled nursing facility likely.  PT evaluation pending.  TOC has been consulted.  Status is: Inpatient  The patient is inpatient because: Multiple falls, need for skilled nursing facility placement, IV fluids   Family Communication:  Spoke with the patient's daughter at length.  She strongly wishes her to be considered for placement.  Consultants:  Psychiatry  Procedures:  None  Antimicrobials:  None   Subjective: Today, patient was seen and examined at bedside.  Appears to be more alert awake and Communicative.  Denies any pain, nausea, vomiting, fever or chills.    Objective: Vitals:   02/14/22 1552 02/14/22 2138 02/15/22 0921 02/15/22 0953  BP: (!) 155/94 123/86 (!) 72/56 93/63  Pulse: 87 82 (!) 110 96  Resp: '18 20 16 16  '$ Temp: 97.8 F (36.6 C) 98.1 F (36.7 C) 97.6 F (36.4 C) (!) 97.4 F (36.3 C)  TempSrc: Oral Oral Oral Oral  SpO2: 98% 96% 97% 96%  Weight:      Height:        Intake/Output Summary (Last 24  hours) at 02/15/2022 1247 Last data filed at 02/15/2022 0300 Gross per 24 hour  Intake 1138.71 ml  Output 1250 ml  Net -111.29 ml    Filed Weights   02/12/22 2337  Weight: 54.4 kg    Physical Examination: Body mass index is 18.79 kg/m.   General: Awake and Communicative, thinly built, distress HENT:   No scleral pallor or icterus noted.  Oral mucosa is moist.  Chest:  Clear breath sounds.  . No crackles or wheezes.  CVS: S1 &S2 heard. No murmur.  Regular rate and rhythm. Abdomen: Soft, nontender, nondistended.  Bowel sounds are heard.   Extremities: No cyanosis, clubbing or edema.  Peripheral pulses are palpable. Psych: Alert, awake, oriented x 3. CNS:  No cranial nerve deficits.  Nonfocal. Skin: Warm and dry.  No rashes noted.  Data Reviewed:   CBC: Recent Labs  Lab 02/11/22 1556 02/12/22 2348 02/14/22 0800 02/15/22 0627  WBC 6.9 7.3 6.6 5.6  NEUTROABS 5.0 4.0 3.5 2.2  HGB 12.8 12.7 12.7 12.5  HCT 40.0 40.0 39.4 37.8  MCV 92.0 92.6 90.6 90.2  PLT 259 250 231 251     Basic Metabolic Panel: Recent Labs  Lab 02/11/22 1556 02/12/22 2348 02/14/22 0800 02/15/22 0627  NA 143 143 133* 138  K 3.3* 4.0 3.9 4.0  CL 107 107 101 106  CO2 '25 26 23 22  '$ GLUCOSE 125* 89 87 75  BUN 10 10 7* 10  CREATININE 0.82 0.87 0.79 0.90  CALCIUM 8.8* 9.1 8.4* 8.5*  MG 2.0 2.0 1.9 1.9  PHOS 3.1  --   --   --      Liver Function Tests: Recent Labs  Lab 02/11/22 1556 02/14/22 0800 02/15/22 0627  AST 15 12* 10*  ALT '10 10 8  '$ ALKPHOS 60 66 58  BILITOT 0.6 1.3* 1.0  PROT 5.8* 5.5* 5.1*  ALBUMIN 3.5 3.1* 2.8*      Radiology Studies: No results found.    LOS: 1 day    Flora Lipps, MD [facility] Available via Epic secure chat 7am-7pm After these hours, please refer to coverage provider listed on [email or web address] 02/15/2022, 12:47 PM

## 2022-02-15 NOTE — Consult Note (Signed)
[facility] Consult   Reason for Consult: Patient with anxiety/bipolar disorder/depression and family concerned about aggressive behavior.  Referring Physician: Dr. Marlowe Sax Patient Identification: [name] MRN:  [id number] Principal Diagnosis: Orthostatic hypotension Diagnosis:  Principal Problem:   Orthostatic hypotension Active Problems:   Syncope   Bipolar disorder (HCC)   Lumbar radiculopathy   Depression with anxiety   History of pulmonary embolism   Cerebral venous thrombosis of sigmoid sinus   Lower abdominal pain   Total Time spent with patient: 45 minutes  Subjective:   [name] is a 66 y.o. female   patient remains calm and cooperative.  On today's reassessment patient did not present with any focal neurological deficit, she was able to speak clearly, follow commands, and denies weakness.   At this current time, patient's pre-existing condition that was substantially aggravated prior to this admission has returned to previous level.  Patient further states that her current health at this present time seems to be normal as she is noted to feel better and like her self again.  She does continue to endorse some mental fog, overall assessing is doing better.  She does not present with any decrease in attention or concentration, she is alert and oriented x 4, shows no short-term memory deficit.  She is able to recall her reason for admission " I kept falling and dizzy and stuff." She denies any previous actions that resulted in hospitalization, and or confabulation and her delusions.  She is not lethargic on today's exam, shows linear thought processes, and answers all questions appropriately.  At this time it is felt that patient has returned to psychiatric baseline, and will be stable to discharge home. She denies suicidal ideation, homicidal ideation, and or auditory or visual hallucinations Will continue with current medications at this time Risperdal '3mg'$  and  Depakote '250mg'$ .   Patient does have multiple underlying psychiatric conditions, and is currently being managed outpatient by Donnal Moat.  Patient does have a history of bipolar 1 disorder, depression, anxiety.  Currently being managed with amitriptyline, valproic acid, hydroxyzine, Risperdal.   HPI:   [name] is a 67 y.o. 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  Family requested nursing home placement due to patient having episodes of confusion and aggressive behavior.  In the ED, patient had severe orthostatic hypotension with blood pressure dropping from 153/97 supine to 86/62 standing.  No significant lab abnormalities except Depakote level subtherapeutic.  Family told RN that patient has not been taking her Depakote as prescribed.  In addition, she has not taken Lovenox for 3 days due to prescription running out.  CT head and C-spine negative for acute finding. Patient was given Haldol, Zofran, and 1 L normal saline bolus.  TRH called to admit.   Past Psychiatric History: Anxiety, Bipolar disorder, and depression.  Previously takes Xanax 1 mg p.o. 3 times daily as needed., meds recently discontinued for suspected diversion.  Also takes Depakote, Gabapentin, Hydroxyzine, Risperidone at home.  Patient is currently under the services of Donnal Moat at [facility], she is also receiving therapy through the same office. Last OV 02/05/2022.   Risk to Self: Denies Risk to Others: Denies Prior Inpatient Therapy: Denies Prior Outpatient Therapy: [facility].  Past Medical History:  Past Medical History:  Diagnosis Date   Anxiety    anxiety and mild depressive order systoms   Bipolar disorder ([place])      (02/20/2013)   Cervical cancer ([place]) 03/19/1980   Cholelithiasis 11/11/2021   Depression    Dysrhythmia    Headache(784.0)    "monthly" (02/20/2013)   Hyperlipidemia 11/11/2021   Pacemaker    medtronic   PONV (postoperative nausea and vomiting)    Sinus pause 02/14/2013   Syncope and collapse    echo-April 12,2012-EF 55% Echo normal; recorder with prolonged sinus pauses --> status post   Tobacco abuse    Vertigo     Past Surgical History:  Procedure Laterality Date   ABDOMINAL HYSTERECTOMY  03/19/1980   "partial"   BACK SURGERY     CERVICAL FUSION Left 11/2013   C4-C6   CERVICAL FUSION     3,4,5,   INSERT / REPLACE / REMOVE PACEMAKER  02/20/2013   medtronic   LOOP RECORDER IMPLANT N/A 11/06/2012   Procedure: LINQ LOOP RECORDER IMPLANT;  Surgeon: Sanda Klein, MD;  Location: [facility];  Service: Cardiovascular;  Laterality: N/A;   NECK SURGERY  11/17/2013   PACEMAKER PLACEMENT N/A 02/16/2013   PERMANENT PACEMAKER INSERTION N/A 02/20/2013   Procedure: PERMANENT PACEMAKER INSERTION;  Surgeon: Sanda Klein, MD;  Location: [facility];  Service: Cardiovascular;  Laterality: N/A;   POSTERIOR LUMBAR FUSION  ~ 2009   ROBOTIC ASSISTED SALPINGO OOPHERECTOMY Bilateral 11/25/2018   Procedure: XI ROBOTIC ASSISTED BILATERAL SALPINGO OOPHORECTOMY and LYSIS OF ADHESIONS.;  Surgeon: Everitt Amber, MD;  Location: [facility];  Service: Gynecology;  Laterality: Bilateral;   SPINAL FUSION  03/19/2014   TONSILLECTOMY     TRANSTHORACIC ECHOCARDIOGRAM  07/11/2010   The left atrial size is normal.There is no evidence of mitral vavle prolaspe.Right ventriicular systolic pressure is normal . Injection of contrast documented no interatrial shunt. Essentially normal 2D echo -doppler study    Family History:  Family History  Adopted: Yes  Problem Relation Age of Onset   Heart attack Brother    Family Psychiatric  History: Unable to assess Social History:  Social History   Substance and Sexual Activity   Alcohol Use No     Social History   Substance and Sexual Activity  Drug Use No    Social History   Socioeconomic History   Marital status: Widowed    Spouse name: Not on file   Number of children: 3   Years of education: 85   Highest education level: Not on file  Occupational History   Not on file  Tobacco Use   Smoking status: Every Day    Packs/day: 0.50    Years: 40.00    Total pack years: 20.00    Types: Cigarettes   Smokeless tobacco: Never  Vaping Use   Vaping Use: Never used  Substance and Sexual Activity   Alcohol use: No   Drug use: No   Sexual activity: Yes  Other Topics Concern   Not on file  Social History Narrative   Married 29 years . Has 3 children. Current smoker -1 pack a day-for 36 years .Alcohol :  1 beer on occasion.   She is soon to be a grandmother.  His before to going on a trip up to the [place] to be closer to family when the baby is born.  She is extremely excited.   She very much would like to move back up to that area  to be closer to family, but the whether is it just too cold in the winter.   Right handed    Soda daily   Lives alone   Social Determinants of Health   Financial Resource Strain: Low Risk  (10/11/2020)   Overall Financial Resource Strain (CARDIA)    Difficulty of Paying Living Expenses: Not hard at all  Food Insecurity: No Food Insecurity (11/22/2021)   Hunger Vital Sign    Worried About Running Out of Food in the Last Year: Never true    Ran Out of Food in the Last Year: Never true  Transportation Needs: No Transportation Needs (11/22/2021)   PRAPARE - Hydrologist (Medical): No    Lack of Transportation (Non-Medical): No  Physical Activity: Not on file  Stress: No Stress Concern Present (10/11/2020)   [place]    Feeling of Stress : Only a little  Social Connections: Not on file   Additional Social  History:    Allergies:   Allergies  Allergen Reactions   Penicillins Other (See Comments)    Yeast infection Did it involve swelling of the face/tongue/throat, SOB, or low BP? No Did it involve sudden or severe rash/hives, skin peeling, or any reaction on the inside of your mouth or nose? No Did you need to seek medical attention at a hospital or doctor's office? Yes When did it last happen? More than 5 years ago  If all above answers are "NO", may proceed with cephalosporin use.     Labs:  Results for orders placed or performed during the hospital encounter of 02/12/22 (from the past 48 hour(s))  Urinalysis, Routine w reflex microscopic     Status: Abnormal   Collection Time: 02/13/22  5:40 PM  Result Value Ref Range   Color, Urine YELLOW YELLOW   APPearance CLOUDY (A) CLEAR   Specific Gravity, Urine 1.010 1.005 - 1.030   pH 8.0 5.0 - 8.0   Glucose, UA NEGATIVE NEGATIVE mg/dL   Hgb urine dipstick SMALL (A) NEGATIVE   Bilirubin Urine NEGATIVE NEGATIVE   Ketones, ur 5 (A) NEGATIVE mg/dL   Protein, ur NEGATIVE NEGATIVE mg/dL   Nitrite NEGATIVE NEGATIVE   Leukocytes,Ua TRACE (A) NEGATIVE   RBC / HPF 11-20 0 - 5 RBC/hpf   WBC, UA 6-10 0 - 5 WBC/hpf   Bacteria, UA FEW (A) NONE SEEN   Squamous Epithelial / LPF 0-5 0 - 5   Mucus PRESENT    Budding Yeast PRESENT     Comment: Performed at [facility], [address], [place], [place] [address]  Magnesium     Status: None   Collection Time: 02/14/22  8:00 AM  Result Value Ref Range   Magnesium 1.9 1.7 - 2.4 mg/dL    Comment: Performed at [facility], [place] [address] [place],  [address]  CBC with Differential/Platelet     Status: None   Collection Time: 02/14/22  8:00 AM  Result Value Ref Range   WBC 6.6 4.0 - 10.5 K/uL   RBC 4.35 3.87 - 5.11 MIL/uL   Hemoglobin 12.7 12.0 - 15.0 g/dL   HCT 39.4 36.0 - 46.0 %   MCV 90.6 80.0 - 100.0 fL   MCH 29.2 26.0 - 34.0 pg   MCHC 32.2 30.0 - 36.0 g/dL   RDW 13.2  11.5 - 15.5 %   Platelets 231 150 - 400 K/uL   nRBC  0.0 0.0 - 0.2 %   Neutrophils Relative % 53 %   Neutro Abs 3.5 1.7 - 7.7 K/uL   Lymphocytes Relative 35 %   Lymphs Abs 2.3 0.7 - 4.0 K/uL   Monocytes Relative 9 %   Monocytes Absolute 0.6 0.1 - 1.0 K/uL   Eosinophils Relative 2 %   Eosinophils Absolute 0.1 0.0 - 0.5 K/uL   Basophils Relative 1 %   Basophils Absolute 0.1 0.0 - 0.1 K/uL   Immature Granulocytes 0 %   Abs Immature Granulocytes 0.02 0.00 - 0.07 K/uL    Comment: Performed at [facility] [address] [place], [place] [address]  Comprehensive metabolic panel     Status: Abnormal   Collection Time: 02/14/22  8:00 AM  Result Value Ref Range   Sodium 133 (L) 135 - 145 mmol/L    Comment: DELTA CHECK NOTED   Potassium 3.9 3.5 - 5.1 mmol/L   Chloride 101 98 - 111 mmol/L   CO2 23 22 - 32 mmol/L   Glucose, Bld 87 70 - 99 mg/dL    Comment: Glucose reference range applies only to samples taken after fasting for at least 8 hours.   BUN 7 (L) 8 - 23 mg/dL   Creatinine, Ser 0.79 0.44 - 1.00 mg/dL   Calcium 8.4 (L) 8.9 - 10.3 mg/dL   Total Protein 5.5 (L) 6.5 - 8.1 g/dL   Albumin 3.1 (L) 3.5 - 5.0 g/dL   AST 12 (L) 15 - 41 U/L   ALT 10 0 - 44 U/L   Alkaline Phosphatase 66 38 - 126 U/L   Total Bilirubin 1.3 (H) 0.3 - 1.2 mg/dL   GFR, Estimated >60 >60 mL/min    Comment: (NOTE) Calculated using the CKD-EPI Creatinine Equation (2021)    Anion gap 9 5 - 15    Comment: Performed at [facility], [place] [address] [place], [place] [address]  Brain natriuretic peptide     Status: None   Collection Time: 02/14/22  8:00 AM  Result Value Ref Range   B Natriuretic Peptide 53.4 0.0 - 100.0 pg/mL    Comment: Performed at [facility] [address], [place], [place] [address]  Magnesium     Status: None   Collection Time: 02/15/22  6:27 AM  Result Value Ref Range   Magnesium 1.9 1.7 - 2.4 mg/dL    Comment: Performed at [facility] [address]  [place] [address]  CBC with Differential/Platelet     Status: None   Collection Time: 02/15/22  6:27 AM  Result Value Ref Range   WBC 5.6 4.0 - 10.5 K/uL   RBC 4.19 3.87 - 5.11 MIL/uL   Hemoglobin 12.5 12.0 - 15.0 g/dL   HCT 37.8 36.0 - 46.0 %   MCV 90.2 80.0 - 100.0 fL   MCH 29.8 26.0 - 34.0 pg   MCHC 33.1 30.0 - 36.0 g/dL   RDW 13.3 11.5 - 15.5 %   Platelets 251 150 - 400 K/uL   nRBC 0.0 0.0 - 0.2 %   Neutrophils Relative % 39 %   Neutro Abs 2.2 1.7 - 7.7 K/uL   Lymphocytes Relative 50 %   Lymphs Abs 2.7 0.7 - 4.0 K/uL   Monocytes Relative 8 %   Monocytes Absolute 0.5 0.1 - 1.0 K/uL   Eosinophils Relative 2 %   Eosinophils Absolute 0.1 0.0 - 0.5 K/uL   Basophils Relative 1 %  Basophils Absolute 0.1 0.0 - 0.1 K/uL   Immature Granulocytes 0 %   Abs Immature Granulocytes 0.01 0.00 - 0.07 K/uL    Comment: Performed at [facility], [place] [address], [place], [place] [address]  Comprehensive metabolic panel     Status: Abnormal   Collection Time: 02/15/22  6:27 AM  Result Value Ref Range   Sodium 138 135 - 145 mmol/L   Potassium 4.0 3.5 - 5.1 mmol/L   Chloride 106 98 - 111 mmol/L   CO2 22 22 - 32 mmol/L   Glucose, Bld 75 70 - 99 mg/dL    Comment: Glucose reference range applies only to samples taken after fasting for at least 8 hours.   BUN 10 8 - 23 mg/dL   Creatinine, Ser 0.90 0.44 - 1.00 mg/dL   Calcium 8.5 (L) 8.9 - 10.3 mg/dL   Total Protein 5.1 (L) 6.5 - 8.1 g/dL   Albumin 2.8 (L) 3.5 - 5.0 g/dL   AST 10 (L) 15 - 41 U/L   ALT 8 0 - 44 U/L   Alkaline Phosphatase 58 38 - 126 U/L   Total Bilirubin 1.0 0.3 - 1.2 mg/dL   GFR, Estimated >60 >60 mL/min    Comment: (NOTE) Calculated using the CKD-EPI Creatinine Equation (2021)    Anion gap 10 5 - 15    Comment: Performed at [facility], [place] [address] [place], [place] [address]  Brain natriuretic peptide     Status: None   Collection Time: 02/15/22  6:27 AM  Result Value Ref Range   B Natriuretic  Peptide 19.8 0.0 - 100.0 pg/mL    Comment: Performed at [facility] [address], [place], [place] [address]    Current Facility-Administered Medications  Medication Dose Route Frequency Provider Last Rate Last Admin   0.9 %  sodium chloride infusion   Intravenous Continuous Pokhrel, Laxman, MD 75 mL/hr at 02/14/22 2007 New Bag at 02/14/22 2007   acetaminophen (TYLENOL) tablet 650 mg  650 mg Oral Q6H PRN Shela Leff, MD       Or   acetaminophen (TYLENOL) suppository 650 mg  650 mg Rectal Q6H PRN Shela Leff, MD       amitriptyline (ELAVIL) tablet 50 mg  50 mg Oral QHS Shela Leff, MD   50 mg at 02/14/22 2123   carvedilol (COREG) tablet 3.125 mg  3.125 mg Oral BID WC Thurnell Lose, MD   3.125 mg at 02/14/22 1724   cefTRIAXone (ROCEPHIN) 1 g in sodium chloride 0.9 % 100 mL IVPB  1 g Intravenous Q24H Pokhrel, Laxman, MD 200 mL/hr at 02/14/22 1551 Infusion Verify at 02/14/22 1551   Chlorhexidine Gluconate Cloth 2 % PADS 6 each  6 each Topical Daily Shela Leff, MD   6 each at 02/15/22 1107   enoxaparin (LOVENOX) injection 50 mg  50 mg Subcutaneous BID Shela Leff, MD   50 mg at 02/15/22 9147   gabapentin (NEURONTIN) capsule 100 mg  100 mg Oral BID Shela Leff, MD   100 mg at 02/15/22 8295   haloperidol lactate (HALDOL) injection 2 mg  2 mg Intramuscular Q6H PRN Thurnell Lose, MD   2 mg at 02/13/22 1145   hydrALAZINE (APRESOLINE) injection 10 mg  10 mg Intravenous Q6H PRN Thurnell Lose, MD       midodrine (PROAMATINE) tablet 2.5 mg  2.5 mg Oral TID WC Shela Leff, MD   2.5 mg at 02/15/22 1207  Oral care mouth rinse  15 mL Mouth Rinse PRN Shela Leff, MD       Oral care mouth rinse  15 mL Mouth Rinse 4 times per day Shela Leff, MD   15 mL at 02/15/22 1208   Oral care mouth rinse  15 mL Mouth Rinse PRN Shela Leff, MD       risperiDONE (RISPERDAL) tablet 3 mg  3 mg Oral QHS Shela Leff, MD   3 mg at  02/14/22 2122   valproate (DEPACON) 250 mg in dextrose 5 % 50 mL IVPB  250 mg Intravenous Q8H Suella Broad, FNP 52.5 mL/hr at 02/15/22 0527 250 mg at 02/15/22 8841    Musculoskeletal: Strength & Muscle Tone: within normal limits Gait & Station: normal Patient leans: N/A     Psychiatric Specialty Exam:  Presentation  General Appearance: Appropriate for Environment; Casual  Eye Contact:Good  Speech:Clear and Coherent; Normal Rate  Speech Volume:Normal  Handedness:Right   Mood and Affect  Mood:irritable  Affect:Appropriate; Congruent   Thought Process  Thought Processes:WNL Descriptions of Associations:Intact Orientation:full Thought Content:Logical  History of Schizophrenia/Schizoaffective disorder:No data recorded Duration of Psychotic Symptoms:No data recorded Hallucinations:No data recorded  Ideas of Reference:None  Suicidal Thoughts:No data recorded  Homicidal Thoughts:No data recorded   Sensorium  Memory:improved Judgment:improved Insight:fair  Executive Functions  Concentration:Fair  Attention Span:Fair  [place]   Psychomotor Activity  Psychomotor Activity:No data recorded   Assets  Assets:Communication Skills; Desire for Improvement; Financial Resources/Insurance; Physical Health; Leisure Time; Social Support   Sleep  Sleep:No data recorded  Physical Exam: Physical Exam Vitals and nursing note reviewed.  Constitutional:      Appearance: Normal appearance. She is normal weight.  Neurological:     Mental Status: She is alert and oriented to person, place, and time. Mental status is at baseline.     Cranial Nerves: Cranial nerves 2-12 are intact.     Motor: Motor function is intact.     Coordination: Coordination is intact.  Psychiatric:        Attention and Perception: Attention and perception normal. She is attentive.        Mood and Affect: Mood and affect normal. Affect is  not blunt.        Speech: Speech normal.        Behavior: Behavior normal. Behavior is cooperative.        Thought Content: Thought content normal.        Cognition and Memory: Memory normal.    Review of Systems  Psychiatric/Behavioral: Negative.    All other systems reviewed and are negative.  Blood pressure 122/77, pulse 78, temperature 97.6 F (36.4 C), temperature source Oral, resp. rate 18, height '5\' 7"'$  (1.702 m), weight 54.4 kg, SpO2 98 %. Body mass index is 18.79 kg/m.  Treatment Plan Summary: Daily contact with patient to assess and evaluate symptoms and progress in treatment, Medication management, and Plan    Continue home meds. WIll order valproic acid level QAM, should be within normal range. No changes will be made unless she is supratherapuetic as this is her current home dose.  -Last EKG obtained 11/27, QTC of 457. Patient with multiple risk factors for QTc prolongation to include cardiac abnormalities, female, age >58 and electrolyte abnormalities.  Psychotropic medication will likely cause worsening QTC prolongation.  As needed agitation protocol has been ordered.   Will limit as needed psychotropic medication such as Haldol, Geodon, Zyprexa.  As of this note patient  has not received any additional as needed Haldol at this time.  -Patient has outpatient appointment scheduled with neurology, however will recommend she pursue this appointment if discharged by Monday.    -Patient is at risk for developing delirium, recommend initiating delirium precautions to include safety sitter at night. Rescind IVC as she is no longer a danger to self or others.    Psychiatry to sign off at this time.  Disposition: No evidence of imminent risk to self or others at present.   Patient does not meet criteria for psychiatric inpatient admission. Supportive therapy provided about ongoing stressors.  Suella Broad, FNP 02/15/2022 1:55 PM

## 2022-02-15 NOTE — Evaluation (Signed)
Occupational Therapy Evaluation Patient Details Name: [name] MRN: [id number] DOB: 1955/12/18 Today's Date: 02/15/2022   History of Present Illness [name] is a 66 y.o. female presented to the ED due to multiple recent falls.  Family requested nursing home placement due to patient having episodes of confusion and aggressive behavior.  In the ED, patient had severe orthostatic hypotension with blood pressure dropping from 153/97 supine to 86/62 standing.  No significant lab abnormalities except Depakote level subtherapeutic.  Family told RN that patient has not been taking her Depakote as prescribed.  In addition, she has not taken Lovenox for 3 days due to prescription running out.  CT head and C-spine negative for acute finding.   Clinical Impression   Patient admitted for the diagnosis above.  PTA she lives with her daughter, who is able to assist as needed.  Patient with continued orthostatic hypotension with changes in position, but no dizziness.  Of note, she has had a dramatic improvement in her mentation, and is probably very close to her baseline. OT will follow in the acute setting, but if she continues at this pace, no post acute OT is anticipated.    Supine: 143/86 Sit: 116/86 Stand: 125/92      Recommendations for follow up therapy are one component of a multi-disciplinary discharge planning process, led by the attending physician.  Recommendations may be updated based on patient status, additional functional criteria and insurance authorization.   Follow Up Recommendations  No OT follow up     Assistance Recommended at Discharge Set up Supervision/Assistance  Patient can return home with the following Assist for transportation;Direct supervision/assist for medications management    Functional Status Assessment  Patient has had a recent decline in their functional status and demonstrates the ability to make significant improvements in function in a reasonable and  predictable amount of time.  Equipment Recommendations  None recommended by OT    Recommendations for Other Services       Precautions / Restrictions Precautions Precautions: Fall Precaution Comments: Orthostatic hypotension, sitter Restrictions Weight Bearing Restrictions: No      Mobility Bed Mobility Overal bed mobility: Modified Independent                  Transfers Overall transfer level: Needs assistance Equipment used: 1 person hand held assist Transfers: Sit to/from Stand, Bed to chair/wheelchair/BSC Sit to Stand: Supervision     Step pivot transfers: Supervision, Min guard            Balance Overall balance assessment: Needs assistance Sitting-balance support: Feet supported Sitting balance-Leahy Scale: Good     Standing balance support: Single extremity supported Standing balance-Leahy Scale: Fair                             ADL either performed or assessed with clinical judgement   ADL                                         General ADL Comments: Generalized supervision     Vision Patient Visual Report: No change from baseline       Perception     Praxis      Pertinent Vitals/Pain Pain Assessment Pain Assessment: No/denies pain     Hand Dominance Right   Extremity/Trunk Assessment Upper Extremity Assessment Upper Extremity Assessment: Overall  WFL for tasks assessed   Lower Extremity Assessment Lower Extremity Assessment: Defer to PT evaluation       Communication Communication Communication: No difficulties   Cognition Arousal/Alertness: Awake/alert Behavior During Therapy: WFL for tasks assessed/performed Overall Cognitive Status: Within Functional Limits for tasks assessed                                       General Comments   BP recorded above    Exercises     Shoulder Instructions      Home Living Family/patient expects to be discharged to:: Private  residence Living Arrangements: Children Available Help at Discharge: Family;Available 24 hours/day Type of Home: Apartment Home Access: Stairs to enter [employer] of Steps: flight Entrance Stairs-Rails: Right Home Layout: One level     Bathroom Shower/Tub: Teacher, early years/pre: Standard Bathroom Accessibility: Yes How Accessible: Accessible via walker Home Equipment: None          Prior Functioning/Environment Prior Level of Function : Independent/Modified Independent             Mobility Comments: drives ADLs Comments: independent        OT Problem List: Impaired balance (sitting and/or standing)      OT Treatment/Interventions: Self-care/ADL training;Therapeutic activities;Balance training    OT Goals(Current goals can be found in the care plan section) Acute Rehab OT Goals Patient Stated Goal: Hoping to return home OT Goal Formulation: With patient Time For Goal Achievement: 03/01/22 Potential to Achieve Goals: Good ADL Goals Pt Will Perform Grooming: Independently;standing Pt Will Perform Lower Body Dressing: Independently;sit to/from stand Pt Will Transfer to Toilet: Independently;ambulating;regular height toilet  OT Frequency: Min 2X/week    Co-evaluation              AM-PAC OT "6 Clicks" Daily Activity     Outcome Measure Help from another person eating meals?: None Help from another person taking care of personal grooming?: None Help from another person toileting, which includes using toliet, bedpan, or urinal?: A Little Help from another person bathing (including washing, rinsing, drying)?: A Little Help from another person to put on and taking off regular upper body clothing?: None Help from another person to put on and taking off regular lower body clothing?: A Little 6 Click Score: 21   End of Session Nurse Communication: Mobility status  Activity Tolerance: Patient tolerated treatment well Patient left: in  chair;with call bell/phone within reach;with nursing/sitter in room  OT Visit Diagnosis: Unsteadiness on feet (R26.81)                Time: 1413-1430 OT Time Calculation (min): 17 min Charges:  OT General Charges $OT Visit: 1 Visit OT Evaluation $OT Eval Moderate Complexity: 1 Mod  02/15/2022  RP, OTR/L  Acute Rehabilitation Services  Office:  [phone]   Metta Clines 02/15/2022, 2:36 PM

## 2022-02-15 NOTE — Progress Notes (Signed)
Valproic Acid Pharmacy Consult:  Labs: Albumin  Date Value Ref Range Status  02/15/2022 2.8 (L) 3.5 - 5.0 g/dL Final  02/25/2020 3.4 (L) 3.8 - 4.8 g/dL Final   AST  Date Value Ref Range Status  02/15/2022 10 (L) 15 - 41 U/L Final  02/14/2022 12 (L) 15 - 41 U/L Final  02/11/2022 15 15 - 41 U/L Final  12/05/2021 19 15 - 41 U/L Final   ALT  Date Value Ref Range Status  02/15/2022 8 0 - 44 U/L Final  02/14/2022 10 0 - 44 U/L Final  02/11/2022 10 0 - 44 U/L Final  12/05/2021 29 0 - 44 U/L Final   Ammonia  Date Value Ref Range Status  11/23/2021 131 (H) 9 - 35 umol/L Final    Comment:    Performed at [facility], [place] [address] [place], [place] [address]  03/08/2021 15 9 - 35 umol/L Final    Comment:    Performed at [facility], [place] [address] [place], [place] [address]  03/22/2020 13 9 - 35 umol/L Final    Comment:    Performed at [facility], [place] [address], [place], [place] [address]   Platelets  Date Value Ref Range Status  02/15/2022 251 150 - 400 K/uL Final  02/14/2022 231 150 - 400 K/uL Final  02/12/2022 250 150 - 400 K/uL Final   Platelet Count  Date Value Ref Range Status  12/05/2021 209 150 - 400 K/uL Final   Valproic Acid Lvl  Date Value Ref Range Status  02/13/2022 34 (L) 50.0 - 100.0 ug/mL Final    Comment:    Performed at [facility], [place] [address] [place], [place] [address]  01/27/2022 <10 (L) 50.0 - 100.0 ug/mL Final    Comment:    Performed at [facility], [place] [address] [place], [place] [address]  11/22/2021 85 50.0 - 100.0 ug/mL Final    Comment:    Performed at [facility], [place] [address], [place], [place] [address]    Assessment:  [name] is a 66 y.o. female admitted on 02/12/22 with severe orthostatic hypotension.  Pharmacy has been consulted to dose valproic acid for Continuation of therapy.   Indication   Bipolar Disorder    PTA  Dosing   Formulation: on IV VPA 11/28> 11/30   Switch to PO 11/30 PM    Dose/Frequency: '250mg'$  Q 8 hr    Goal Levels  Total Levels (serum: unbound and bound)  Seizures: 50-100 mcg/mL   Mania: 50-125 mcg/mL   Free Levels (serum: unbound)   Seizures/Mania: 5-15 mcg/mL      11/27 VPA random level on admission = 34. Family reports patient is not taking divalporate as prescribed at home.  12/1 VPA trough pending   Dose adjustments this admission:  none   Plan: F/u 12/1 trough level    Thank you for allowing pharmacy to be a part of this patient's care.  Benetta Spar, PharmD, BCPS, BCCP Clinical Pharmacist  Please check AMION for all [place] phone numbers After 10:00 PM, call [place] [phone]

## 2022-02-15 NOTE — TOC Progression Note (Signed)
Transition of Care [facility]) - Progression Note    Patient Details  Name: [name] MRN: [id number] Date of Birth: 1955/07/06  Transition of Care [facility]) CM/SW [place], [place] Phone Number: 02/15/2022, 4:36 PM  Clinical Narrative:     Per Psych-Rescind IVC as she is no longer a danger to self or others -Rescind form placed on chart for MD to sign.  Thurmond Butts, MSW, LCSW Clinical Social Worker         Expected Discharge Plan and Services                                                 Social Determinants of Health (SDOH) Interventions    Readmission Risk Interventions    11/13/2021   10:54 AM  Readmission Risk Prevention Plan  Transportation Screening Complete  PCP or Specialist Appt within 3-5 Days Complete  HRI or [place] Complete  Social Work Consult for [place] Planning/Counseling Complete  Palliative Care Screening Not Applicable  Medication Review Press photographer) Complete

## 2022-02-16 DIAGNOSIS — F319 Bipolar disorder, unspecified: Secondary | ICD-10-CM | POA: Diagnosis not present

## 2022-02-16 DIAGNOSIS — Z86711 Personal history of pulmonary embolism: Secondary | ICD-10-CM | POA: Diagnosis not present

## 2022-02-16 DIAGNOSIS — I951 Orthostatic hypotension: Secondary | ICD-10-CM | POA: Diagnosis not present

## 2022-02-16 DIAGNOSIS — G08 Intracranial and intraspinal phlebitis and thrombophlebitis: Secondary | ICD-10-CM | POA: Diagnosis not present

## 2022-02-16 LAB — CBC WITH DIFFERENTIAL/PLATELET
Abs Immature Granulocytes: 0.01 10*3/uL (ref 0.00–0.07)
Basophils Absolute: 0 10*3/uL (ref 0.0–0.1)
Basophils Relative: 1 %
Eosinophils Absolute: 0.1 10*3/uL (ref 0.0–0.5)
Eosinophils Relative: 2 %
HCT: 37.3 % (ref 36.0–46.0)
Hemoglobin: 12.5 g/dL (ref 12.0–15.0)
Immature Granulocytes: 0 %
Lymphocytes Relative: 52 %
Lymphs Abs: 2.9 10*3/uL (ref 0.7–4.0)
MCH: 29.6 pg (ref 26.0–34.0)
MCHC: 33.5 g/dL (ref 30.0–36.0)
MCV: 88.4 fL (ref 80.0–100.0)
Monocytes Absolute: 0.5 10*3/uL (ref 0.1–1.0)
Monocytes Relative: 9 %
Neutro Abs: 2 10*3/uL (ref 1.7–7.7)
Neutrophils Relative %: 36 %
Platelets: 229 10*3/uL (ref 150–400)
RBC: 4.22 MIL/uL (ref 3.87–5.11)
RDW: 13.1 % (ref 11.5–15.5)
WBC: 5.5 10*3/uL (ref 4.0–10.5)
nRBC: 0 % (ref 0.0–0.2)

## 2022-02-16 LAB — COMPREHENSIVE METABOLIC PANEL
ALT: 8 U/L (ref 0–44)
AST: 11 U/L — ABNORMAL LOW (ref 15–41)
Albumin: 3.1 g/dL — ABNORMAL LOW (ref 3.5–5.0)
Alkaline Phosphatase: 54 U/L (ref 38–126)
Anion gap: 7 (ref 5–15)
BUN: 9 mg/dL (ref 8–23)
CO2: 25 mmol/L (ref 22–32)
Calcium: 8.7 mg/dL — ABNORMAL LOW (ref 8.9–10.3)
Chloride: 109 mmol/L (ref 98–111)
Creatinine, Ser: 0.76 mg/dL (ref 0.44–1.00)
GFR, Estimated: >60 mL/min (ref >60)
Glucose, Bld: 81 mg/dL (ref 70–99)
Potassium: 4 mmol/L (ref 3.5–5.1)
Sodium: 141 mmol/L (ref 135–145)
Total Bilirubin: 0.7 mg/dL (ref 0.3–1.2)
Total Protein: 5.4 g/dL — ABNORMAL LOW (ref 6.5–8.1)

## 2022-02-16 LAB — BRAIN NATRIURETIC PEPTIDE: B Natriuretic Peptide: 38.9 pg/mL (ref 0.0–100.0)

## 2022-02-16 LAB — MAGNESIUM: Magnesium: 2 mg/dL (ref 1.7–2.4)

## 2022-02-16 LAB — VALPROIC ACID LEVEL: Valproic Acid Lvl: 78 ug/mL (ref 50.0–100.0)

## 2022-02-16 NOTE — Care Management Important Message (Signed)
Important Message  Patient Details  Name: [name] MRN: [id number] Date of Birth: Mar 20, 1955   Medicare Important Message Given:  Yes     Orbie Pyo 02/16/2022, 2:11 PM

## 2022-02-16 NOTE — Progress Notes (Signed)
PROGRESS NOTE    JERA HEADINGS  VZC:[id number] DOB: 09/01/1955 DOA: 02/12/2022 PCP: Lujean Amel, MD    Brief Narrative:  [name] is a 66 y.o. 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  She was watching TV and when she was trying to go to the bathroom she felt dizzy and blacked out.  Family had requested nursing home placement due to confusion and aggressive behavior.  In the ED, patient was noted to be orthostatic.  Laboratory data was unremarkable.  Patient was not taking her Depakote as prescribed and also Lovenox for 3 days.  CT head and C-spine negative for acute finding.  Patient does have history of recurrent falls recently.  Patient was then considered for admission to the hospital for further evaluation and treatment.   Assessment and Plan:  Syncope likely secondary to severe orthostatic hypotension Not on antihypertensives or diuretics.  Still does have orthostatic hypotension.  Continue midodrine, compression stockings, fall precautions.  History of sinus pause status post pacemaker.  No aortic stenosis on previous echocardiogram done in February 2023.  TSH of 1.2.  Physical therapy has recommended skilled nursing facility on discharge.  Off IV fluids at this time.  Will discontinue Coreg for now due to orthostatic hypotension.  History of seizures. On Depakote for that . Will continue.  Therapeutic level okay.   Lower abdominal pain and urinary symptoms Improved.  Urinalysis with trace leukocytes negative nitrates but 6-10 white cells.  Add Rocephin IV.  Patient has required a Foley catheter at this time.  She was supposed to follow-up with urology in December due to her bladder  dysfunction and recurrent UTI.  Follow urine culture and sensitivity.  Do voiding trial Foley catheter removal prior to discharge.   Anxiety/bipolar disorder/depression  Will continue Depakote, amitriptyline, risperidone.  Psychiatry has seen the patient and at this time patient has been taken off involuntary commitment and one-to-one sitter.  No changes to medications have been made.  History of dural venous sinus thrombosis/PE. Continue therapeutic Lovenox    Chronic lumbar radiculopathy -Continue gabapentin    DVT prophylaxis: Place TED hose Start: 02/13/22 0824   Code Status:     Code Status: Full Code  Disposition:  Skilled nursing facility likely.  TOC on board.  Status is: Inpatient  The patient is inpatient because: Multiple falls, need for skilled nursing facility placement,   Family Communication:  Spoke with the patient's daughter at length on 02/15/2022.   Consultants:  Psychiatry  Procedures:  None  Antimicrobials:  None   Subjective: Today, patient was seen and examined at bedside.  Denies pain, nausea, vomiting, fever, chills or rigor.   Objective: Vitals:   02/15/22 2228 02/16/22 0456 02/16/22 0817 02/16/22 0818  BP: (!) 144/95 (!) 129/90 122/70 122/70  Pulse: 79 81 91 91  Resp: '18 20  16  '$ Temp: 98.6 F (37 C) 98.9 F (37.2 C)  98.3 F (36.8 C)  TempSrc: Oral   Oral  SpO2: 97% 97%  97%  Weight:      Height:        Intake/Output Summary (Last 24 hours) at 02/16/2022 1154 Last data filed at 02/16/2022 1041 Gross per 24 hour  Intake 975.54 ml  Output 1550 ml  Net -574.46 ml  Filed Weights   02/12/22 2337  Weight: 54.4 kg    Physical Examination: Body mass index is 18.79 kg/m.   General: Awake awake and Communicative, thinly built, not in obvious distress, HENT:   No scleral pallor or icterus noted. Oral mucosa is moist.  Chest:  Clear breath sounds.  . No crackles or wheezes.  CVS: S1 &S2 heard. No murmur.  Regular rate and  rhythm. Abdomen: Soft, nontender, nondistended.  Bowel sounds are heard.   Extremities: No cyanosis, clubbing or edema.  Peripheral pulses are palpable. Psych: Alert, awake, oriented x 3.  Communicative. CNS:  No cranial nerve deficits.  Nonfocal. Skin: Warm and dry.  No rashes noted.  Data Reviewed:   CBC: Recent Labs  Lab 02/11/22 1556 02/12/22 2348 02/14/22 0800 02/15/22 0627 02/16/22 0722  WBC 6.9 7.3 6.6 5.6 5.5  NEUTROABS 5.0 4.0 3.5 2.2 2.0  HGB 12.8 12.7 12.7 12.5 12.5  HCT 40.0 40.0 39.4 37.8 37.3  MCV 92.0 92.6 90.6 90.2 88.4  PLT 259 250 231 251 229     Basic Metabolic Panel: Recent Labs  Lab 02/11/22 1556 02/12/22 2348 02/14/22 0800 02/15/22 0627 02/16/22 0722  NA 143 143 133* 138 141  K 3.3* 4.0 3.9 4.0 4.0  CL 107 107 101 106 109  CO2 '25 26 23 22 25  '$ GLUCOSE 125* 89 87 75 81  BUN 10 10 7* 10 9  CREATININE 0.82 0.87 0.79 0.90 0.76  CALCIUM 8.8* 9.1 8.4* 8.5* 8.7*  MG 2.0 2.0 1.9 1.9 2.0  PHOS 3.1  --   --   --   --      Liver Function Tests: Recent Labs  Lab 02/11/22 1556 02/14/22 0800 02/15/22 0627 02/16/22 0722  AST 15 12* 10* 11*  ALT '10 10 8 8  '$ ALKPHOS 60 66 58 54  BILITOT 0.6 1.3* 1.0 0.7  PROT 5.8* 5.5* 5.1* 5.4*  ALBUMIN 3.5 3.1* 2.8* 3.1*      Radiology Studies: No results found.    LOS: 2 days    Flora Lipps, MD [facility] Available via Epic secure chat 7am-7pm After these hours, please refer to coverage provider listed on [email or web address] 02/16/2022, 11:54 AM

## 2022-02-16 NOTE — TOC Initial Note (Addendum)
Transition of Care [facility]) - Initial/Assessment Note    Patient Details  Name: [name] MRN: [id number] Date of Birth: [birth year]  Transition [facility] Care [facility]) CM/SW Contact:    Joanne Chars, LCSW Phone Number: 02/16/2022, 11:49 AM  Clinical Narrative:      CSW met with pt regarding DC recommendation for SNF.  Pt oriented x2-3, was able to participate in conversation, but CSW found out after that pt still talking about things that are not true.  Discusse SNF placement, pt does not want SNF, wants to return home with daughter.  Pt lives with daughter [name].  We discussed how much help she would have at home, [name] does work, pt then said [name] was "going to [place]" but still stating she would go home and "pay for some help."        Choice document given, permission given to speak with both daughters.  CSW spoke with daughter [name], who lives in [place].  She and her sister are wanting to move forward with SNF, she also asked about LTC options as it is not working to have her home with Vanessa's sister [name], who works.  [name] is not going to [place].  CSW agreed to provide list of ALF and will start SNF bed search.     Level 2 passr docs uploaded.    Expected Discharge Plan: Skilled Nursing Facility Barriers to Discharge: Continued Medical Work up, SNF Pending bed offer   Patient Goals and CMS Choice Patient states their goals for this hospitalization and ongoing recovery are:: get back to what I was CMS Medicare.gov Compare Post Acute Care list provided to:: Patient Choice offered to / list presented to : Patient, Adult Children  Expected Discharge Plan and Services Expected Discharge Plan: [place] In-house Referral: Clinical Social Work   Post Acute Care Choice: [place] Living arrangements for the past 2 months: [place]                                      Prior Living Arrangements/Services Living arrangements  for the past 2 months: Single Family Home Lives with:: Adult Children (lives with Investment banker, operational) Patient language and need for interpreter reviewed:: Yes Do you feel safe going back to the place where you live?: Yes      Need for Family Participation in Patient Care: Yes (Comment) Care giver support system in place?: Yes (comment) Current home services: Other (comment) (none) Criminal Activity/Legal Involvement Pertinent to Current Situation/Hospitalization: No - Comment as needed  Activities of Daily Living Home Assistive Devices/Equipment: None ADL Screening (condition at time of admission) Patient's cognitive ability adequate to safely complete daily activities?: No Is the patient deaf or have difficulty hearing?: No Does the patient have difficulty seeing, even when wearing glasses/contacts?: No Does the patient have difficulty concentrating, remembering, or making decisions?: Yes Patient able to express need for assistance with ADLs?: Yes Does the patient have difficulty dressing or bathing?: Yes Independently performs ADLs?: No Communication: Independent Dressing (OT): Needs assistance Is this a change from baseline?: Pre-admission baseline Grooming: Needs assistance Is this a change from baseline?: Pre-admission baseline Feeding: Needs assistance Is this a change from baseline?: Pre-admission baseline Bathing: Dependent Is this a change from baseline?: Pre-admission baseline Toileting: Dependent Is this a change from baseline?: Pre-admission baseline In/Out Bed: Dependent Is this a change from baseline?: Pre-admission baseline Walks in Home:  Dependent Is this a change from baseline?: Pre-admission baseline Does the patient have difficulty walking or climbing stairs?: Yes Weakness of Legs: Both Weakness of Arms/Hands: None  Permission Sought/Granted Permission sought to share information with : Family Supports Permission granted to share information with : Yes, Verbal  Permission Granted  Share Information with NAME: daughters [name] and [name]           Emotional Assessment Appearance:: Appears stated age Attitude/Demeanor/Rapport: Engaged Affect (typically observed): Pleasant Orientation: : Oriented to Self, Oriented to Place   Psych Involvement: Yes (comment)  Admission diagnosis:  Orthostatic hypotension [I95.1] Contusion of occipital region of scalp, initial encounter [S00.03XA] Fall at home, initial encounter [W19.XXXA, Y92.009] Syncope [R55] Patient Active Problem List   Diagnosis Date Noted   Orthostatic hypotension 02/13/2022   Lower abdominal pain 02/13/2022   Acute encephalopathy 11/22/2021   Gross hematuria 11/11/2021   Aortic atherosclerosis ([place]) 11/11/2021   Cholelithiasis 11/11/2021   Hyperlipidemia 11/11/2021   Supratherapeutic INR 11/07/2021   History of pulmonary embolism 11/07/2021   Cerebral venous thrombosis of sigmoid sinus 11/07/2021   Seizure-like activity ([place]) 11/07/2021   Acute deep vein thrombosis (DVT) of left peroneal vein (HCC) 10/27/2021   Acute pulmonary embolism without acute cor pulmonale (HCC) 10/27/2021   Jugular vein thrombosis, left 10/27/2021   Acute cystitis 10/26/2021   Severe protein-calorie malnutrition ([place]) 10/25/2021   Electrolyte abnormality 05/04/2021   Altered mental status 05/04/2021   fall with rhabdomyloysis  05/03/2021   Leukocytosis 05/03/2021   Chronic pain 05/03/2021   Underweight 05/03/2021   Lactic acidosis 86/76/7209   Acute metabolic encephalopathy 47/11/6281   Degeneration of lumbar intervertebral disc 08/16/2020   Sepsis secondary to UTI ([place]) 04/17/2020   Hypoglycemia without diagnosis of diabetes mellitus 04/17/2020   Sepsis due to pneumonia ([place]) 04/17/2020   Physical deconditioning    Myositis 03/03/2020   HCAP (healthcare-associated pneumonia) 02/21/2020   Generalized weakness 02/21/2020   GAD (generalized anxiety disorder) 02/21/2020   Proximal muscle  weakness 02/08/2020   Dysphagia 02/08/2020   Gait disturbance 01/04/2020   Elevated CK 01/04/2020   Weakness of both lower extremities 01/04/2020   Encephalopathy    Polypharmacy    Fall 12/18/2019   Hypokalemia    Abnormal liver function test    Cervical post-laminectomy syndrome 10/21/2019   Cervical spondylosis 09/23/2019   Memory loss 07/02/2019   Depression with anxiety 07/02/2019   B12 deficiency 07/02/2019   Lumbar radiculopathy 02/10/2019   History of fusion of lumbar spine 02/10/2019   Dysesthesia 02/10/2019   Retroperitoneal fibrosis    Pelvic adhesions    Left ovarian cyst 11/17/2018   Elevated tumor markers 11/17/2018   Dyspnea 09/15/2018   Laryngopharyngeal reflux (LPR) 05/19/2018   Bipolar disorder ([place]) 04/16/2018   Insomnia 04/16/2018   Attention deficit hyperactivity disorder (ADHD) 04/16/2018   Pain in right knee 04/04/2017   Neck pain on right side 09/09/2014   Pseudoarthrosis of lumbar spine 04/15/2014   Family history of arrhythmogenic right ventricular cardiomyopathy 03/30/2013   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 02/20/2013   Sinus pause 02/14/2013   Syncope 10/29/2012   Hx of syncope- s/p Loop recorder 11/06/12 10/29/2012   Vertigo 10/29/2012   Tobacco abuse 10/29/2012   BENIGN POSITIONAL VERTIGO 11/14/2009   EAR PAIN, RIGHT 11/14/2009   SCIATICA 05/09/2009   PCP:  Lujean Amel, MD Pharmacy:   [id number] [place] [address]: [phone]: [phone]  [place] [address] Phone: [phone] Fax: [phone]     Social Determinants of Health (SDOH) Interventions    Readmission Risk Interventions    11/13/2021   10:54 AM  Readmission Risk Prevention Plan  Transportation Screening Complete  PCP or Specialist Appt within 3-5 Days Complete  HRI or [place] Complete   Social Work Consult for [place] Planning/Counseling Complete  Palliative Care Screening Not Applicable  Medication Review Press photographer) Complete

## 2022-02-16 NOTE — Progress Notes (Signed)
Valproic Acid Pharmacy Consult:  Labs: Albumin  Date Value Ref Range Status  02/16/2022 3.1 (L) 3.5 - 5.0 g/dL Final  02/25/2020 3.4 (L) 3.8 - 4.8 g/dL Final   AST  Date Value Ref Range Status  02/16/2022 11 (L) 15 - 41 U/L Final  02/15/2022 10 (L) 15 - 41 U/L Final  02/14/2022 12 (L) 15 - 41 U/L Final  12/05/2021 19 15 - 41 U/L Final   ALT  Date Value Ref Range Status  02/16/2022 8 0 - 44 U/L Final  02/15/2022 8 0 - 44 U/L Final  02/14/2022 10 0 - 44 U/L Final  12/05/2021 29 0 - 44 U/L Final   Ammonia  Date Value Ref Range Status  11/23/2021 131 (H) 9 - 35 umol/L Final    Comment:    Performed at [facility], [place] [address] [place], [place] [address]  03/08/2021 15 9 - 35 umol/L Final    Comment:    Performed at [facility], [place] [address] [place], [place] [address]  03/22/2020 13 9 - 35 umol/L Final    Comment:    Performed at [facility], [place] [address] [place], [place] [address]   Platelets  Date Value Ref Range Status  02/16/2022 229 150 - 400 K/uL Final  02/15/2022 251 150 - 400 K/uL Final  02/14/2022 231 150 - 400 K/uL Final   Platelet Count  Date Value Ref Range Status  12/05/2021 209 150 - 400 K/uL Final   Valproic Acid Lvl  Date Value Ref Range Status  02/16/2022 78 50.0 - 100.0 ug/mL Final    Comment:    Performed at [facility], [place] [address], [place], [place] [address]  02/13/2022 34 (L) 50.0 - 100.0 ug/mL Final    Comment:    Performed at [facility] [address], [place], [place] [address]  01/27/2022 <10 (L) 50.0 - 100.0 ug/mL Final    Comment:    Performed at [facility], [place] [address] [place], [place] [address]    Assessment:  [name] is a 66 y.o. female admitted on 02/12/22 with severe orthostatic hypotension.  Pharmacy has been consulted to dose valproic acid for Continuation of therapy for history of seizures.   Indication   Bipolar  Disorder    PTA Dosing   Formulation: on IV VPA 11/28> 11/30   Switch to PO 11/30 PM    Dose/Frequency: '250mg'$  Q 8 hr    Goal Levels  Total Levels (serum: unbound and bound)  Seizures: 50-100 mcg/mL   Mania: 50-125 mcg/mL   Free Levels (serum: unbound)   Seizures/Mania: 5-15 mcg/mL      11/27 VPA random level on admission = 34. Family reports patient is not taking divalporate as prescribed at home.  She was given250 mg  IV VPA 11/28> 11/30 then switched to  250 mg VPA PO 11/30 PM   Today 12/1 the VPA  level is 78, which is within therapeutic goal on divalproex DR 250 mg po q8hr.   Dose adjustments this admission:  none   Plan: Continue Divalproex DR 250 mg PO every 8 hr.    Thank you for allowing pharmacy to be a part of this patient's care.  Nicole Cella, RPh Clinical Pharmacist [phone]  Please check AMION for all [facility] phone numbers After 10:00 PM, call [place] [phone]

## 2022-02-16 NOTE — Progress Notes (Addendum)
PT Cancellation Note  Patient Details Name: [name] MRN: [id number] DOB: 14-Oct-1955   Cancelled Treatment:    Reason Eval/Treat Not Completed: Fatigue/lethargy limiting ability to participate  Patient was unable to tolerate standing due to feeling "hot" when she stands. Unable to check BP due to poor tolerance in standing. Will hold formal therapy session at this time and follow up again as time allows.    02/16/22 0938  Orthostatic Lying   BP- Lying 101/81  Pulse- Lying 85  Orthostatic Sitting  BP- Sitting (!) 85/68  Pulse- Sitting [place], PT, DPT Physical Therapist [facility]

## 2022-02-16 NOTE — Consult Note (Signed)
  Valproic acid level of 78, albumin 3.1.  Currently at goal. Will continue current home dose at this time. Patient is currently stable on Depakote '250mg'$  po TID.  No additional changes will be made.  Psychiatry consult service to remain signed off.

## 2022-02-16 NOTE — Plan of Care (Signed)
  Problem: Clinical Measurements: Goal: Respiratory complications will improve Outcome: Progressing Goal: Cardiovascular complication will be avoided Outcome: Progressing   Problem: Coping: Goal: Level of anxiety will decrease Outcome: Progressing   Problem: Safety: Goal: Ability to remain free from injury will improve Outcome: Progressing   

## 2022-02-16 NOTE — NC FL2 (Signed)
[place] MEDICAID FL2 LEVEL OF CARE FORM     IDENTIFICATION  Patient Name: [name] Birthdate: [birth year] Sex: female Admission Date (Current Location): 02/12/2022  [place] and [place] Number:  Kathleen Argue [id number] [place] and Address:  [facility] [place], [place] [address] [place], [place] [address]      Provider Number: [id number]  Attending Physician Name and Address:  Flora Lipps, MD  Relative Name and Phone Number:  Brickey,Vanessa Daughter   [phone]    Current Level of Care: Hospital Recommended Level of Care: [place] Prior Approval Number:    Date Approved/Denied:   PASRR Number:    Discharge Plan: SNF    Current Diagnoses: Patient Active Problem List   Diagnosis Date Noted   Orthostatic hypotension 02/13/2022   Lower abdominal pain 02/13/2022   Acute encephalopathy 11/22/2021   Gross hematuria 11/11/2021   Aortic atherosclerosis ([place]) 11/11/2021   Cholelithiasis 11/11/2021   Hyperlipidemia 11/11/2021   Supratherapeutic INR 11/07/2021   History of pulmonary embolism 11/07/2021   Cerebral venous thrombosis of sigmoid sinus 11/07/2021   Seizure-like activity ([place]) 11/07/2021   Acute deep vein thrombosis (DVT) of left peroneal vein ([place]) 10/27/2021   Acute pulmonary embolism without acute cor pulmonale ([place]) 10/27/2021   Jugular vein thrombosis, 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/2021   Acute cystitis 10/26/2021   Severe protein-calorie malnutrition ([place]) 10/25/2021   Electrolyte abnormality 05/04/2021   Altered mental status 05/04/2021   fall with rhabdomyloysis  05/03/2021   Leukocytosis 05/03/2021   Chronic pain 05/03/2021   Underweight 05/03/2021   Lactic acidosis 50/56/9794   Acute metabolic encephalopathy 80/16/5537   Degeneration of lumbar intervertebral disc 08/16/2020   Sepsis secondary to UTI ([place]) 04/17/2020   Hypoglycemia without diagnosis of diabetes mellitus 04/17/2020   Sepsis due to pneumonia ([place]) 04/17/2020    Physical deconditioning    Myositis 03/03/2020   HCAP (healthcare-associated pneumonia) 02/21/2020   Generalized weakness 02/21/2020   GAD (generalized anxiety disorder) 02/21/2020   Proximal muscle weakness 02/08/2020   Dysphagia 02/08/2020   Gait disturbance 01/04/2020   Elevated CK 01/04/2020   Weakness of both lower extremities 01/04/2020   Encephalopathy    Polypharmacy    Fall 12/18/2019   Hypokalemia    Abnormal liver function test    Cervical post-laminectomy syndrome 10/21/2019   Cervical spondylosis 09/23/2019   Memory loss 07/02/2019   Depression with anxiety 07/02/2019   B12 deficiency 07/02/2019   Lumbar radiculopathy 02/10/2019   History of fusion of lumbar spine 02/10/2019   Dysesthesia 02/10/2019   Retroperitoneal fibrosis    Pelvic adhesions    Left ovarian cyst 11/17/2018   Elevated tumor markers 11/17/2018   Dyspnea 09/15/2018   Laryngopharyngeal reflux (LPR) 05/19/2018   Bipolar disorder ([place]) 04/16/2018   Insomnia 04/16/2018   Attention deficit hyperactivity disorder (ADHD) 04/16/2018   Pain in right knee 04/04/2017   Neck pain on right side 09/09/2014   Pseudoarthrosis of lumbar spine 04/15/2014   Family history of arrhythmogenic right ventricular cardiomyopathy 03/30/2013   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 02/20/2013   Sinus pause 02/14/2013   Syncope 10/29/2012   Hx of syncope- s/p Loop recorder 11/06/12 10/29/2012   Vertigo 10/29/2012   Tobacco abuse 10/29/2012   BENIGN POSITIONAL VERTIGO 11/14/2009   EAR PAIN, RIGHT 11/14/2009   SCIATICA 05/09/2009    Orientation RESPIRATION BLADDER Height & Weight     Self, Place  Normal Continent, Indwelling catheter Weight: 120 lb (54.4 kg) Height:  '5\' 7"'$  (170.2  cm)  BEHAVIORAL SYMPTOMS/MOOD NEUROLOGICAL BOWEL NUTRITION STATUS    Convulsions/Seizures Continent Diet (see discharge summary)  AMBULATORY STATUS COMMUNICATION OF NEEDS Skin   Limited Assist Verbally Normal                        Personal Care Assistance Level of Assistance  Bathing, Feeding, Dressing Bathing Assistance: Independent Feeding assistance: Independent Dressing Assistance: Independent     Functional Limitations Info  Sight, Hearing, Speech Sight Info: Adequate Hearing Info: Adequate Speech Info: Adequate    SPECIAL CARE FACTORS FREQUENCY  PT (By licensed PT), OT (By licensed OT)     PT Frequency: 5x week OT Frequency: 5x week            Contractures Contractures Info: Not present    Additional Factors Info  Code Status, Allergies Code Status Info: full Allergies Info: penicillins           Current Medications (02/16/2022):  This is the current hospital active medication list Current Facility-Administered Medications  Medication Dose Route Frequency Provider Last Rate Last Admin   acetaminophen (TYLENOL) tablet 650 mg  650 mg Oral Q6H PRN Shela Leff, MD   650 mg at 02/15/22 2324   Or   acetaminophen (TYLENOL) suppository 650 mg  650 mg Rectal Q6H PRN Shela Leff, MD       amitriptyline (ELAVIL) tablet 50 mg  50 mg Oral QHS Shela Leff, MD   50 mg at 02/15/22 2159   carvedilol (COREG) tablet 3.125 mg  3.125 mg Oral BID WC Thurnell Lose, MD   3.125 mg at 02/16/22 0817   cefTRIAXone (ROCEPHIN) 1 g in sodium chloride 0.9 % 100 mL IVPB  1 g Intravenous Q24H Pokhrel, Laxman, MD 200 mL/hr at 02/15/22 1410 1 g at 02/15/22 1410   Chlorhexidine Gluconate Cloth 2 % PADS 6 each  6 each Topical Daily Shela Leff, MD   6 each at 02/15/22 1107   divalproex (DEPAKOTE) DR tablet 250 mg  250 mg Oral Q8H Suella Broad, FNP   250 mg at 02/16/22 0516   enoxaparin (LOVENOX) injection 50 mg  50 mg Subcutaneous BID Shela Leff, MD   50 mg at 02/16/22 1275   gabapentin (NEURONTIN) capsule 100 mg  100 mg Oral BID Shela Leff, MD   100 mg at 02/16/22 0815   haloperidol lactate (HALDOL) injection 2 mg  2 mg Intramuscular Q6H PRN Thurnell Lose, MD   2 mg at 02/13/22 1145   hydrALAZINE (APRESOLINE) injection 10 mg  10 mg Intravenous Q6H PRN Thurnell Lose, MD       midodrine (PROAMATINE) tablet 2.5 mg  2.5 mg Oral TID WC Shela Leff, MD   2.5 mg at 02/16/22 0815   Oral care mouth rinse  15 mL Mouth Rinse PRN Shela Leff, MD       Oral care mouth rinse  15 mL Mouth Rinse 4 times per day Shela Leff, MD   15 mL at 02/15/22 1715   Oral care mouth rinse  15 mL Mouth Rinse PRN Shela Leff, MD       risperiDONE (RISPERDAL) tablet 3 mg  3 mg Oral QHS Shela Leff, MD   3 mg at 02/15/22 2159     Discharge Medications: Please see discharge summary for a list of discharge medications.  Relevant Imaging Results:  Relevant Lab Results:   Additional Information SSN [id number]  Has had Covid Vaccines  Joanne Chars, [place]

## 2022-02-16 NOTE — Progress Notes (Signed)
Mobility Specialist Progress Note:   02/16/22 1445  Orthostatic Lying   BP- Lying 133/87  Pulse- Lying 83  Orthostatic Sitting  BP- Sitting (!) 117/100  Pulse- Sitting 98  Orthostatic Standing at 0 minutes  BP- Standing at 0 minutes (!) 81/65  Pulse- Standing at 0 minutes 105  Orthostatic Standing at 3 minutes  BP- Standing at 3 minutes (!) 74/59  Pulse- Standing at 3 minutes 123  Mobility  Activity Stood at bedside  Level of Assistance Contact guard assist, steadying assist (for safety)  Assistive Device None  Distance Ambulated (ft)  (unable d/t orthostatic hypotension)  Activity Response Tolerated well  Mobility Referral Yes  $Mobility charge 1 Mobility   Pt agreeable to mobility session. No physical assistance required, only minG for safety. Pt with asymptomatic positive orthostatics today despite TED hose. No dizziness/lightheadedness reported, only "bed legs" d/t not being OOB recently. Pt back in bed with all needs met, bed alarm on.   Nelta Numbers Mobility Specialist Please contact via SecureChat or  Rehab office at [phone]

## 2022-02-16 NOTE — Progress Notes (Signed)
RE:   [name]      Date of Birth: Aug 05, 2055      Date:   21/1/23       To Whom It May Concern:  Please be advised that the above-named patient will require a short-term nursing home stay - anticipated 30 days or less for rehabilitation and strengthening.  The plan is for return home.                 MD signature                Date

## 2022-02-17 DIAGNOSIS — G08 Intracranial and intraspinal phlebitis and thrombophlebitis: Secondary | ICD-10-CM | POA: Diagnosis not present

## 2022-02-17 DIAGNOSIS — F319 Bipolar disorder, unspecified: Secondary | ICD-10-CM | POA: Diagnosis not present

## 2022-02-17 DIAGNOSIS — Z86711 Personal history of pulmonary embolism: Secondary | ICD-10-CM | POA: Diagnosis not present

## 2022-02-17 DIAGNOSIS — I951 Orthostatic hypotension: Secondary | ICD-10-CM | POA: Diagnosis not present

## 2022-02-17 LAB — CBC WITH DIFFERENTIAL/PLATELET
Abs Immature Granulocytes: 0.02 10*3/uL (ref 0.00–0.07)
Basophils Absolute: 0.1 10*3/uL (ref 0.0–0.1)
Basophils Relative: 1 %
Eosinophils Absolute: 0.1 10*3/uL (ref 0.0–0.5)
Eosinophils Relative: 2 %
HCT: 37.4 % (ref 36.0–46.0)
Hemoglobin: 12.7 g/dL (ref 12.0–15.0)
Immature Granulocytes: 0 %
Lymphocytes Relative: 53 %
Lymphs Abs: 3.2 10*3/uL (ref 0.7–4.0)
MCH: 30.1 pg (ref 26.0–34.0)
MCHC: 34 g/dL (ref 30.0–36.0)
MCV: 88.6 fL (ref 80.0–100.0)
Monocytes Absolute: 0.4 10*3/uL (ref 0.1–1.0)
Monocytes Relative: 7 %
Neutro Abs: 2.2 10*3/uL (ref 1.7–7.7)
Neutrophils Relative %: 37 %
Platelets: 239 10*3/uL (ref 150–400)
RBC: 4.22 MIL/uL (ref 3.87–5.11)
RDW: 13.2 % (ref 11.5–15.5)
WBC: 6 10*3/uL (ref 4.0–10.5)
nRBC: 0 % (ref 0.0–0.2)

## 2022-02-17 LAB — COMPREHENSIVE METABOLIC PANEL
ALT: 9 U/L (ref 0–44)
AST: 12 U/L — ABNORMAL LOW (ref 15–41)
Albumin: 3.2 g/dL — ABNORMAL LOW (ref 3.5–5.0)
Alkaline Phosphatase: 54 U/L (ref 38–126)
Anion gap: 9 (ref 5–15)
BUN: 10 mg/dL (ref 8–23)
CO2: 27 mmol/L (ref 22–32)
Calcium: 9.2 mg/dL (ref 8.9–10.3)
Chloride: 104 mmol/L (ref 98–111)
Creatinine, Ser: 0.8 mg/dL (ref 0.44–1.00)
GFR, Estimated: >60 mL/min (ref >60)
Glucose, Bld: 79 mg/dL (ref 70–99)
Potassium: 4.1 mmol/L (ref 3.5–5.1)
Sodium: 140 mmol/L (ref 135–145)
Total Bilirubin: 0.7 mg/dL (ref 0.3–1.2)
Total Protein: 5.5 g/dL — ABNORMAL LOW (ref 6.5–8.1)

## 2022-02-17 LAB — URINE CULTURE: Culture: >=100000 — AB

## 2022-02-17 LAB — BRAIN NATRIURETIC PEPTIDE: B Natriuretic Peptide: 25.7 pg/mL (ref 0.0–100.0)

## 2022-02-17 LAB — MAGNESIUM: Magnesium: 2.1 mg/dL (ref 1.7–2.4)

## 2022-02-17 NOTE — Progress Notes (Signed)
PROGRESS NOTE    [name]  TMH:[id number] DOB: 1955/05/27 DOA: 02/12/2022 PCP: Lujean Amel, MD    Brief Narrative:  [name] is a 67 y.o. 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  She was watching TV and when she was trying to go to the bathroom she felt dizzy and blacked out.  Family had requested nursing home placement due to confusion and aggressive behavior.  In the ED, patient was noted to be orthostatic.  Laboratory data was unremarkable.  Patient was not taking her Depakote as prescribed and also Lovenox for 3 days.  CT head and C-spine negative for acute finding.  Patient does have history of recurrent falls recently.  Patient was then considered for admission to the hospital for further evaluation and treatment.   Assessment and Plan:  Syncope likely secondary to severe orthostatic hypotension Continue midodrine, compression stockings, fall precautions.  History of sinus pause status post pacemaker.  No aortic stenosis on previous echocardiogram done in February 2023.  TSH of 1.2.  Physical therapy has recommended skilled nursing facility on discharge.  Off IV fluids at this time.  Off coreg due to orthostatic hypotension.  History of seizures. On Depakote continue   Possible UTI On IV Rocephin.  On Foley catheter due to bladder dysfunction.  Urine culture with staph epi.    Anxiety/bipolar disorder/depression  Will continue Depakote, amitriptyline, risperidone.  Psychiatry has seen the patient and at this time patient has been taken off involuntary commitment and one-to-one sitter.  No changes to medications have been made.  History of dural venous sinus  thrombosis/PE. Continue therapeutic Lovenox    Chronic lumbar radiculopathy -Continue gabapentin    DVT prophylaxis: Place TED hose Start: 02/13/22 0824   Code Status:     Code Status: Full Code  Disposition:  Skilled nursing facility as per PT recommendation.  Status is: Inpatient  The patient is inpatient because: Multiple falls, need for skilled nursing facility placement,   Family Communication:  Spoke with the patient's daughter  [name] at length on 02/15/2022.   Consultants:  Psychiatry  Procedures:  None  Antimicrobials:  None   Subjective: Today, patient was seen and examined at bedside.  Denies any nausea vomiting fever chills or rigor.  No other symptoms.   Objective: Vitals:   02/17/22 0820 02/17/22 0824 02/17/22 0827 02/17/22 0834  BP: 121/81 (!) 70/58 130/80 105/73  Pulse: 87 (!) 110 (!) 112 94  Resp:      Temp: 98.1 F (36.7 C)   97.6 F (36.4 C)  TempSrc: Oral   Oral  SpO2: 95% 96% 98% 97%  Weight:      Height:        Intake/Output Summary (Last 24 hours) at 02/17/2022 1156 Last data filed at 02/17/2022 0900 Gross per 24 hour  Intake 480 ml  Output 1250 ml  Net -770 ml    Filed Weights   02/12/22 2337 02/16/22 2106  Weight: 54.4 kg 46.4 kg    Physical Examination: Body mass index is 16.02 kg/m.   General: Alert awake oriented, Communicative, not in obvious distress, thinly built HENT:   No scleral pallor or icterus noted. Oral mucosa is moist.  Chest:  Clear breath sounds.  . No crackles or wheezes.  CVS: S1 &S2 heard. No murmur.  Regular rate and rhythm. Abdomen: Soft, nontender, nondistended.  Bowel sounds are heard.   Extremities: No cyanosis, clubbing or edema.  Peripheral pulses are palpable. Psych: Alert, awake, oriented Communicative. CNS:  No cranial nerve deficits.  No focal deficits Skin: Warm and dry.  No rashes noted.  Data Reviewed:   CBC: Recent Labs  Lab 02/12/22 2348 02/14/22 0800 02/15/22 0627  02/16/22 0722 02/17/22 0338  WBC 7.3 6.6 5.6 5.5 6.0  NEUTROABS 4.0 3.5 2.2 2.0 2.2  HGB 12.7 12.7 12.5 12.5 12.7  HCT 40.0 39.4 37.8 37.3 37.4  MCV 92.6 90.6 90.2 88.4 88.6  PLT 250 231 251 229 239     Basic Metabolic Panel: Recent Labs  Lab 02/11/22 1556 02/12/22 2348 02/14/22 0800 02/15/22 0627 02/16/22 0722 02/17/22 0338  NA 143 143 133* 138 141 140  K 3.3* 4.0 3.9 4.0 4.0 4.1  CL 107 107 101 106 109 104  CO2 '25 26 23 22 25 27  '$ GLUCOSE 125* 89 87 75 81 79  BUN 10 10 7* '10 9 10  '$ CREATININE 0.82 0.87 0.79 0.90 0.76 0.80  CALCIUM 8.8* 9.1 8.4* 8.5* 8.7* 9.2  MG 2.0 2.0 1.9 1.9 2.0 2.1  PHOS 3.1  --   --   --   --   --      Liver Function Tests: Recent Labs  Lab 02/11/22 1556 02/14/22 0800 02/15/22 0627 02/16/22 0722 02/17/22 0338  AST 15 12* 10* 11* 12*  ALT '10 10 8 8 9  '$ ALKPHOS 60 66 58 54 54  BILITOT 0.6 1.3* 1.0 0.7 0.7  PROT 5.8* 5.5* 5.1* 5.4* 5.5*  ALBUMIN 3.5 3.1* 2.8* 3.1* 3.2*      Radiology Studies: No results found.    LOS: 3 days    Flora Lipps, MD [facility] Available via Epic secure chat 7am-7pm After these hours, please refer to coverage provider listed on [email or web address] 02/17/2022, 11:56 AM

## 2022-02-17 NOTE — Progress Notes (Signed)
Physical Therapy Treatment Patient Details Name: [name] MRN: [id number] DOB: 1956-01-25 Today's Date: 02/17/2022   History of Present Illness [name] is a 66 y.o. female presented to the ED due to multiple recent falls.  Family requested nursing home placement due to patient having episodes of confusion and aggressive behavior.  In the ED, patient had severe orthostatic hypotension with blood pressure dropping from 153/97 supine to 86/62 standing.  No significant lab abnormalities except Depakote level subtherapeutic.  Family told RN that patient has not been taking her Depakote as prescribed.  In addition, she has not taken Lovenox for 3 days due to prescription running out.  CT head and C-spine negative for acute finding.    PT Comments    Slow progress, limited by symptomatic orthostatic hypotension (*see below). Tolerated Bed mobility and transfer training, however standing tolerance remains limited, min assist with transfers. Patient participated in LE exercises while seated and tolerated very well. Encouraged to perform LE exercises periodically throughout the day. Remains pleasantly confused. After transfer and LE exercises, BP with LEs on the floor in recliner was noted to be 114/85 HR 97, asymptomatic. Patient will continue to benefit from skilled physical therapy services to further improve independence with functional mobility.     02/17/22 1350  Orthostatic Lying   BP- Lying (!) 129/91  Pulse- Lying 88  Orthostatic Sitting  BP- Sitting 102/84  Pulse- Sitting 100  Orthostatic Standing at 0 minutes  BP- Standing at 0 minutes (!) 68/58  Pulse- Standing at 0 minutes 127      Recommendations for follow up therapy are one component of a multi-disciplinary discharge planning process, led by the attending physician.  Recommendations may be updated based on patient status, additional functional criteria and insurance authorization.  Follow Up Recommendations  Skilled  nursing-short term rehab (<3 hours/day) Can patient physically be transported by private vehicle: No (but perhaps soon when BPs with activity are stabilized)   Assistance Recommended at Discharge Frequent or constant Supervision/Assistance  Patient can return home with the following Assistance with cooking/housework;Help with stairs or ramp for entrance;Assist for transportation;A lot of help with walking and/or transfers;A lot of help with bathing/dressing/bathroom;Direct supervision/assist for medications management;Direct supervision/assist for financial management   Equipment Recommendations  Rollator (4 wheels);BSC/3in1    Recommendations for Other Services       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 VC to facilitate    Transfers Overall transfer level: Needs assistance Equipment used: 1 person hand held assist Transfers: Sit to/from Stand Sit to Stand: Min assist   Step pivot transfers: Min assist       General transfer comment: min assist to rise to stand and step pivot transfer for balance, VC for sequencing. some posterior instability. Tolerated standing long enough to obtain BP.    Ambulation/Gait               General Gait Details: not appropriate 2/2 orthostatics   Stairs             Wheelchair Mobility    Modified Rankin (Stroke Patients Only)       Balance Overall balance assessment: Needs assistance Sitting-balance support: Feet supported, No  upper extremity supported Sitting balance-Leahy Scale: Good Sitting balance - Comments: sits up in chair back unsupported.   Standing balance support: Single  extremity supported, During functional activity Standing balance-Leahy Scale: Poor Standing balance comment: posterior lean at times                            Cognition Arousal/Alertness: Awake/alert Behavior During Therapy: WFL for tasks assessed/performed Overall Cognitive Status: Impaired/Different from baseline Area of Impairment: Orientation, Following commands, Safety/judgement, Problem solving                 Orientation Level: Disoriented to, Time     Following Commands: Follows one step commands inconsistently Safety/Judgement: Decreased awareness of safety, Decreased awareness of deficits   Problem Solving: Slow processing, Decreased initiation General Comments: Pleasantly confused        Exercises General Exercises - Lower Extremity Ankle Circles/Pumps: AROM, Both, 15 reps, Seated Quad Sets: Strengthening, Both, 15 reps, Seated Gluteal Sets: Strengthening, Both, 15 reps, Seated Long Arc Quad: Strengthening, Both, 15 reps, Seated Hip ABduction/ADduction: Strengthening, Both, 15 reps, Seated Hip Flexion/Marching: Strengthening, Both, 15 reps, Seated    General Comments General comments (skin integrity, edema, etc.): Orthostatics: supine 129/91 HR 88; seated 102/84 HR 100; standing 68/58 HR 127; Return to reclined postion in chair immediately 133/98 HR 93      Pertinent Vitals/Pain Pain Assessment Pain Assessment: 0-10 Pain Score: 8  Pain Location: LEs Pain Descriptors / Indicators: Aching Pain Intervention(s): Limited activity within patient's tolerance, Repositioned, Monitored during session (reports chronic)    Home Living                          Prior Function            PT Goals (current goals can now be found in the care plan section) Acute Rehab PT Goals Patient Stated Goal: Unable to state PT Goal Formulation: Patient unable to participate in goal setting Time For Goal Achievement: 02/28/22 Potential to Achieve  Goals: Good Progress towards PT goals: Progressing toward goals    Frequency    Min 2X/week      PT Plan Current plan remains appropriate    Co-evaluation              AM-PAC PT "6 Clicks" Mobility   Outcome Measure  Help needed turning from your back to your side while in a flat bed without using bedrails?: None Help needed moving from lying on your back to sitting on the side of a flat bed without using bedrails?: A Little Help needed moving to and from a bed to a chair (including a wheelchair)?: A Little Help needed standing up from a chair using your arms (e.g., wheelchair or bedside chair)?: A Little Help needed to walk in hospital room?: Total Help needed climbing 3-5 steps with a railing? : Total 6 Click Score: 15    End of Session Equipment Utilized During Treatment: Gait belt Activity Tolerance: Treatment limited secondary to medical complications (Comment) (orthostatic hypotension, symptomatic) Patient left: with call bell/phone within reach;in chair;with chair alarm set Nurse Communication: Mobility status;Other (comment) (Request thigh high compression stockings.) PT Visit Diagnosis: Unsteadiness on feet (R26.81);Other abnormalities of gait and mobility (R26.89)     Time: 4401-0272 PT Time Calculation (min) (ACUTE ONLY): 35 min  Charges:  $Therapeutic Exercise: 8-22 mins $Therapeutic Activity: 8-22 mins  Candie Mile, PT, DPT Physical Therapist Acute Rehabilitation Services [facility]    Ellouise Newer 02/17/2022, 2:16 PM

## 2022-02-18 DIAGNOSIS — I951 Orthostatic hypotension: Secondary | ICD-10-CM | POA: Diagnosis not present

## 2022-02-18 DIAGNOSIS — Z86711 Personal history of pulmonary embolism: Secondary | ICD-10-CM | POA: Diagnosis not present

## 2022-02-18 DIAGNOSIS — G08 Intracranial and intraspinal phlebitis and thrombophlebitis: Secondary | ICD-10-CM | POA: Diagnosis not present

## 2022-02-18 DIAGNOSIS — F319 Bipolar disorder, unspecified: Secondary | ICD-10-CM | POA: Diagnosis not present

## 2022-02-18 NOTE — Progress Notes (Signed)
PROGRESS NOTE    [name]  NID:[id number] DOB: 1955/03/21 DOA: 02/12/2022 PCP: Lujean Amel, MD    Brief Narrative:  [name] is a 66 y.o. 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  She was watching TV and when she was trying to go to the bathroom she felt dizzy and blacked out.  Family had requested nursing home placement due to confusion and aggressive behavior.  In the ED, patient was noted to be orthostatic.  Laboratory data was unremarkable.  Patient was not taking her Depakote as prescribed and also Lovenox for 3 days.  CT head and C-spine negative for acute finding.  Patient does have history of recurrent falls recently.  Patient was then considered for admission to the hospital for further evaluation and treatment.   Assessment and Plan:  Syncope likely secondary to severe orthostatic hypotension Continue midodrine, compression stockings, fall precautions.  History of sinus pause status post pacemaker.  No aortic stenosis on previous echocardiogram done in February 2023.  TSH of 1.2.  Physical therapy has recommended skilled nursing facility on discharge.  Off coreg due to orthostatic hypotension.  History of seizures.  Continue Depakote.   Possible UTI On IV Rocephin.  On Foley catheter due to bladder dysfunction.  Urine culture with staph epi.  Likely colonization.  Will discontinue Rocephin.  Anxiety/bipolar disorder/depression  Will continue Depakote, amitriptyline, risperidone.  Patient was on involuntary commitment and one-to-one sitter which has been discontinued by psychiatry.    History of dural venous sinus thrombosis/PE. Continue therapeutic Lovenox    Chronic  lumbar radiculopathy -Continue gabapentin    DVT prophylaxis: Place TED hose Start: 02/13/22 0824   Code Status:     Code Status: Full Code  Disposition:  Skilled nursing facility as per PT recommendation.  Medically stable for disposition.  Status is: Inpatient  The patient is inpatient because: Multiple falls, need for skilled nursing facility placement,   Family Communication:  Spoke with the patient's daughter  [name] at length on 02/15/2022.   Consultants:  Psychiatry  Procedures:  None   Antimicrobials:  None   Subjective: Today, patient was seen and examined at bedside.  Denies any nausea vomiting fever chills or rigor.  Denies any shortness of breath or chest pain.  Objective: Vitals:   02/17/22 0834 02/17/22 2014 02/18/22 0523 02/18/22 0834  BP: 105/73 132/84 117/79 91/66  Pulse: 94 86 89 96  Resp:      Temp: 97.6 F (36.4 C) 98.9 F (37.2 C) 97.6 F (36.4 C) 98.4 F (36.9 C)  TempSrc: Oral Oral Oral Oral  SpO2: 97% 97% 100% 97%  Weight:      Height:        Intake/Output Summary (Last 24 hours) at 02/18/2022 1041 Last data filed at 02/18/2022 0526 Gross per 24 hour  Intake 680 ml  Output 900 ml  Net -220 ml    Filed Weights   02/12/22 2337 02/16/22 2106  Weight: 54.4 kg 46.4 kg    Physical Examination: Body mass index is 16.02 kg/m.   General: Alert awake oriented, Communicative, not in obvious distress, thinly built HENT:   No scleral pallor or icterus noted. Oral mucosa is moist.  Chest:  Clear breath sounds.  . No crackles or wheezes.  CVS: S1 &S2 heard. No murmur.  Regular rate and rhythm. Abdomen: Soft, nontender, nondistended.  Bowel sounds are heard.   Extremities: No cyanosis, clubbing or edema.  Peripheral pulses are palpable. Psych: Alert, awake, Communicative, oriented at this time. CNS:  No cranial nerve deficits.  No focal deficits Skin: Warm and dry.  No rashes noted.  Data Reviewed:   CBC: Recent Labs  Lab  02/12/22 2348 02/14/22 0800 02/15/22 0627 02/16/22 0722 02/17/22 0338  WBC 7.3 6.6 5.6 5.5 6.0  NEUTROABS 4.0 3.5 2.2 2.0 2.2  HGB 12.7 12.7 12.5 12.5 12.7  HCT 40.0 39.4 37.8 37.3 37.4  MCV 92.6 90.6 90.2 88.4 88.6  PLT 250 231 251 229 239     Basic Metabolic Panel: Recent Labs  Lab 02/11/22 1556 02/12/22 2348 02/14/22 0800 02/15/22 0627 02/16/22 0722 02/17/22 0338  NA 143 143 133* 138 141 140  K 3.3* 4.0 3.9 4.0 4.0 4.1  CL 107 107 101 106 109 104  CO2 '25 26 23 22 25 27  '$ GLUCOSE 125* 89 87 75 81 79  BUN 10 10 7* '10 9 10  '$ CREATININE 0.82 0.87 0.79 0.90 0.76 0.80  CALCIUM 8.8* 9.1 8.4* 8.5* 8.7* 9.2  MG 2.0 2.0 1.9 1.9 2.0 2.1  PHOS 3.1  --   --   --   --   --      Liver Function Tests: Recent Labs  Lab 02/11/22 1556 02/14/22 0800 02/15/22 0627 02/16/22 0722 02/17/22 0338  AST 15 12* 10* 11* 12*  ALT '10 10 8 8 9  '$ ALKPHOS 60 66 58 54 54  BILITOT 0.6 1.3* 1.0 0.7 0.7  PROT 5.8* 5.5* 5.1* 5.4* 5.5*  ALBUMIN 3.5 3.1* 2.8* 3.1* 3.2*      Radiology Studies: No results found.    LOS: 4 days    Flora Lipps, MD [facility] Available via Epic secure chat 7am-7pm After these hours, please refer to coverage provider listed on [email or web address] 02/18/2022, 10:41 AM

## 2022-02-19 ENCOUNTER — Institutional Professional Consult (permissible substitution): Payer: Medicare Other | Admitting: Neurology

## 2022-02-19 DIAGNOSIS — F319 Bipolar disorder, unspecified: Secondary | ICD-10-CM | POA: Diagnosis not present

## 2022-02-19 DIAGNOSIS — I951 Orthostatic hypotension: Secondary | ICD-10-CM | POA: Diagnosis not present

## 2022-02-19 DIAGNOSIS — G08 Intracranial and intraspinal phlebitis and thrombophlebitis: Secondary | ICD-10-CM | POA: Diagnosis not present

## 2022-02-19 DIAGNOSIS — Z86711 Personal history of pulmonary embolism: Secondary | ICD-10-CM | POA: Diagnosis not present

## 2022-02-19 NOTE — Progress Notes (Signed)
PROGRESS NOTE    [name]  ZJQ:[id number] DOB: May 16, 1955 DOA: 02/12/2022 PCP: Lujean Amel, MD    Brief Narrative:  [name] is a 66 y.o. 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  She was watching TV and when she was trying to go to the bathroom she felt dizzy and blacked out.  Family had requested nursing home placement due to confusion and aggressive behavior.  In the ED, patient was noted to be orthostatic.  Laboratory data was unremarkable.  Patient was not taking her Depakote as prescribed and also Lovenox for 3 days.  CT head and C-spine negative for acute finding.  Patient does have history of recurrent falls recently.  Patient was then considered for admission to the hospital for further evaluation and treatment.   Assessment and Plan:  Syncope likely secondary to severe orthostatic hypotension Continue midodrine, compression stockings, fall precautions.  History of sinus pause status post pacemaker.  No aortic stenosis on previous echocardiogram done in February 2023.  TSH of 1.2.  Physical therapy has recommended skilled nursing facility on discharge.  Off coreg due to orthostatic hypotension.  Blood pressure slightly low today.  Continue midodrine.  History of seizures.  Continue Depakote.   Possible UTI On IV Rocephin.  On Foley catheter due to bladder dysfunction.  Urine culture with staph epi.  Likely colonization.  Off Rocephin.  Urinary retention status post indwelling Foley catheter on presentation.  Patient is supposed to follow-up with urology as outpatient to address this.  Continue while in the hospital.  Anxiety/bipolar disorder/depression  Will continue  Depakote, amitriptyline, risperidone.  Patient was on involuntary commitment and one-to-one sitter which has been discontinued by psychiatry.  Has remained stable at this time.  History of dural venous sinus thrombosis/PE. Continue therapeutic Lovenox    Chronic lumbar radiculopathy -Continue gabapentin    DVT prophylaxis: Place TED hose Start: 02/13/22 0824   Code Status:     Code Status: Full Code  Disposition:  Skilled nursing facility as per PT recommendation.  Medically stable for disposition.  Status is: Inpatient  The patient is inpatient because: Multiple falls, awaiting skilled nursing facility placement,   Family Communication:  Spoke with the patient's daughter  [name] at length on 02/15/2022.   Consultants:  Psychiatry  Procedures:  None   Antimicrobials:  None   Subjective: Today, patient was seen and examined at bedside.  Awaiting for skilled nursing facility.  Denies any dizziness nausea vomiting fever chills or rigor.  Patient is on Foley catheter as outpatient and is supposed to go for urology follow-up in December.  Objective: Vitals:   02/18/22 1547 02/18/22 2122 02/19/22 0232 02/19/22 0829  BP: 119/80 129/89 129/89 97/79  Pulse: 93 88 90 89  Resp:  '16 18 18  '$ Temp: 99.1 F (37.3 C) 98.3 F (36.8 C) 97.8 F (36.6 C) (!) 97.4 F (36.3 C)  TempSrc: Oral Oral Oral   SpO2: 96% 97% 98% 98%  Weight:      Height:        Intake/Output Summary (Last 24 hours) at 02/19/2022 1056 Last data filed at 02/19/2022 0550 Gross per 24 hour  Intake 480 ml  Output 700 ml  Net -220 ml    Filed Weights   02/12/22 2337  02/16/22 2106  Weight: 54.4 kg 46.4 kg    Physical Examination: Body mass index is 16.02 kg/m.   General: Alert awake and Communicative, not in obvious distress, thinly built, HENT:   No scleral pallor or icterus noted. Oral mucosa is moist.  Chest: Clear breath sounds. CVS: S1 &S2 heard. No murmur.  Regular rate and rhythm. Abdomen:  Soft, nontender, nondistended.  Bowel sounds are heard.   Extremities: No cyanosis, clubbing or edema.  Peripheral pulses are palpable.  Seen in the extremities. Psych: Alert, awake, oriented, CNS:  No cranial nerve deficits.  No focal deficits Skin: Warm and dry.  No rashes noted.  Data Reviewed:   CBC: Recent Labs  Lab 02/12/22 2348 02/14/22 0800 02/15/22 0627 02/16/22 0722 02/17/22 0338  WBC 7.3 6.6 5.6 5.5 6.0  NEUTROABS 4.0 3.5 2.2 2.0 2.2  HGB 12.7 12.7 12.5 12.5 12.7  HCT 40.0 39.4 37.8 37.3 37.4  MCV 92.6 90.6 90.2 88.4 88.6  PLT 250 231 251 229 239     Basic Metabolic Panel: Recent Labs  Lab 02/12/22 2348 02/14/22 0800 02/15/22 0627 02/16/22 0722 02/17/22 0338  NA 143 133* 138 141 140  K 4.0 3.9 4.0 4.0 4.1  CL 107 101 106 109 104  CO2 '26 23 22 25 27  '$ GLUCOSE 89 87 75 81 79  BUN 10 7* '10 9 10  '$ CREATININE 0.87 0.79 0.90 0.76 0.80  CALCIUM 9.1 8.4* 8.5* 8.7* 9.2  MG 2.0 1.9 1.9 2.0 2.1     Liver Function Tests: Recent Labs  Lab 02/14/22 0800 02/15/22 0627 02/16/22 0722 02/17/22 0338  AST 12* 10* 11* 12*  ALT '10 8 8 9  '$ ALKPHOS 66 58 54 54  BILITOT 1.3* 1.0 0.7 0.7  PROT 5.5* 5.1* 5.4* 5.5*  ALBUMIN 3.1* 2.8* 3.1* 3.2*      Radiology Studies: No results found.    LOS: 5 days    Flora Lipps, MD [facility] Available via Epic secure chat 7am-7pm After these hours, please refer to coverage provider listed on [email or web address] 02/19/2022, 10:56 AM

## 2022-02-19 NOTE — Plan of Care (Signed)
  Problem: Clinical Measurements: Goal: Respiratory complications will improve Outcome: Progressing   Problem: Nutrition: Goal: Adequate nutrition will be maintained Outcome: Progressing   Problem: Coping: Goal: Level of anxiety will decrease Outcome: Progressing   Problem: Elimination: Goal: Will not experience complications related to urinary retention Outcome: Progressing

## 2022-02-19 NOTE — TOC Progression Note (Signed)
Transition of Care [facility] [place]) - Initial/Assessment Note    Patient Details  Name: [name] MRN: [id number] Date of Birth: 1956-02-28  Transition of Care [facility]) CM/SW Contact:    Milinda Antis, [place] Phone Number: 02/19/2022, 4:13 PM  Clinical Narrative:                 LCSW met with the patient at bedside to present bed offers.  The patient choice is [facility]. LCSW contacted the [place] with admissions at [facility] and left a secure message requesting a returned call to verify that the bed is still available.    TOC will continue to follow.  Expected Discharge Plan: Skilled Nursing Facility Barriers to Discharge: Continued Medical Work up, SNF Pending bed offer   Patient Goals and CMS Choice Patient states their goals for this hospitalization and ongoing recovery are:: get back to what I was CMS Medicare.gov Compare Post Acute Care list provided to:: Patient Choice offered to / list presented to : Patient, Adult Children  Expected Discharge Plan and Services Expected Discharge Plan: [place] In-house Referral: Clinical Social Work   Post Acute Care Choice: [place] Living arrangements for the past 2 months: [employer]                                      Prior Living Arrangements/Services Living arrangements for the past 2 months: Single Family Home Lives with:: Adult Children (lives with Investment banker, operational) Patient language and need for interpreter reviewed:: Yes Do you feel safe going back to the place where you live?: Yes      Need for Family Participation in Patient Care: Yes (Comment) Care giver support system in place?: Yes (comment) Current home services: Other (comment) (none) Criminal Activity/Legal Involvement Pertinent to Current Situation/Hospitalization: No - Comment as needed  Activities of Daily Living Home Assistive Devices/Equipment: None ADL Screening (condition at time of admission) Patient's cognitive  ability adequate to safely complete daily activities?: No Is the patient deaf or have difficulty hearing?: No Does the patient have difficulty seeing, even when wearing glasses/contacts?: No Does the patient have difficulty concentrating, remembering, or making decisions?: Yes Patient able to express need for assistance with ADLs?: Yes Does the patient have difficulty dressing or bathing?: Yes Independently performs ADLs?: No Communication: Independent Dressing (OT): Needs assistance Is this a change from baseline?: Pre-admission baseline Grooming: Needs assistance Is this a change from baseline?: Pre-admission baseline Feeding: Needs assistance Is this a change from baseline?: Pre-admission baseline Bathing: Dependent Is this a change from baseline?: Pre-admission baseline Toileting: Dependent Is this a change from baseline?: Pre-admission baseline In/Out Bed: Dependent Is this a change from baseline?: Pre-admission baseline Walks in Home: Dependent Is this a change from baseline?: Pre-admission baseline Does the patient have difficulty walking or climbing stairs?: Yes Weakness of Legs: Both Weakness of Arms/Hands: None  Permission Sought/Granted Permission sought to share information with : Family Supports Permission granted to share information with : Yes, Verbal Permission Granted  Share Information with NAME: daughters [name] and [name]           Emotional Assessment Appearance:: Appears stated age Attitude/Demeanor/Rapport: Engaged Affect (typically observed): Pleasant Orientation: : Oriented to Self, Oriented to Place   Psych Involvement: Yes (comment)  Admission diagnosis:  Orthostatic hypotension [I95.1] Contusion of occipital region of scalp, initial encounter [S00.03XA] Fall at home, initial encounter [W19.XXXA, Y92.009] Syncope [R55] Patient  Active Problem List   Diagnosis Date Noted   Orthostatic hypotension 02/13/2022   Lower abdominal pain 02/13/2022    Acute encephalopathy 11/22/2021   Gross hematuria 11/11/2021   Aortic atherosclerosis ([place]) 11/11/2021   Cholelithiasis 11/11/2021   Hyperlipidemia 11/11/2021   Supratherapeutic INR 11/07/2021   History of pulmonary embolism 11/07/2021   Cerebral venous thrombosis of sigmoid sinus 11/07/2021   Seizure-like activity ([place]) 11/07/2021   Acute deep vein thrombosis (DVT) of left peroneal vein (HCC) 10/27/2021   Acute pulmonary embolism without acute cor pulmonale (HCC) 10/27/2021   Jugular vein thrombosis, left 10/27/2021   Acute cystitis 10/26/2021   Severe protein-calorie malnutrition ([place]) 10/25/2021   Electrolyte abnormality 05/04/2021   Altered mental status 05/04/2021   fall with rhabdomyloysis  05/03/2021   Leukocytosis 05/03/2021   Chronic pain 05/03/2021   Underweight 05/03/2021   Lactic acidosis 78/93/8101   Acute metabolic encephalopathy 75/12/2583   Degeneration of lumbar intervertebral disc 08/16/2020   Sepsis secondary to UTI ([place]) 04/17/2020   Hypoglycemia without diagnosis of diabetes mellitus 04/17/2020   Sepsis due to pneumonia ([place]) 04/17/2020   Physical deconditioning    Myositis 03/03/2020   HCAP (healthcare-associated pneumonia) 02/21/2020   Generalized weakness 02/21/2020   GAD (generalized anxiety disorder) 02/21/2020   Proximal muscle weakness 02/08/2020   Dysphagia 02/08/2020   Gait disturbance 01/04/2020   Elevated CK 01/04/2020   Weakness of both lower extremities 01/04/2020   Encephalopathy    Polypharmacy    Fall 12/18/2019   Hypokalemia    Abnormal liver function test    Cervical post-laminectomy syndrome 10/21/2019   Cervical spondylosis 09/23/2019   Memory loss 07/02/2019   Depression with anxiety 07/02/2019   B12 deficiency 07/02/2019   Lumbar radiculopathy 02/10/2019   History of fusion of lumbar spine 02/10/2019   Dysesthesia 02/10/2019   Retroperitoneal fibrosis    Pelvic adhesions    Left ovarian cyst 11/17/2018   Elevated tumor  markers 11/17/2018   Dyspnea 09/15/2018   Laryngopharyngeal reflux (LPR) 05/19/2018   Bipolar disorder ([place]) 04/16/2018   Insomnia 04/16/2018   Attention deficit hyperactivity disorder (ADHD) 04/16/2018   Pain in right knee 04/04/2017   Neck pain on right side 09/09/2014   Pseudoarthrosis of lumbar spine 04/15/2014   Family history of arrhythmogenic right ventricular cardiomyopathy 03/30/2013   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 02/20/2013   Sinus pause 02/14/2013   Syncope 10/29/2012   Hx of syncope- s/p Loop recorder 11/06/12 10/29/2012   Vertigo 10/29/2012   Tobacco abuse 10/29/2012   BENIGN POSITIONAL VERTIGO 11/14/2009   EAR PAIN, RIGHT 11/14/2009   SCIATICA 05/09/2009   PCP:  Lujean Amel, MD Pharmacy:   [id number] [place] [id number]: [phone]: [address]Phone: [phone]: [phone]    Social Determinants of Health (SDOH) Interventions    Readmission Risk Interventions    11/13/2021   10:54 AM  Readmission Risk Prevention Plan  Transportation Screening Complete  PCP or Specialist Appt within 3-5 Days Complete  HRI or HTownerComplete  Social Work Consult for [place]seling Complete  Palliative Care Screening Not Applicable  Medication Review (Press photographer Complete

## 2022-02-20 ENCOUNTER — Telehealth: Payer: Self-pay | Admitting: Cardiovascular Disease

## 2022-02-20 DIAGNOSIS — F319 Bipolar disorder, unspecified: Secondary | ICD-10-CM | POA: Diagnosis not present

## 2022-02-20 DIAGNOSIS — I951 Orthostatic hypotension: Secondary | ICD-10-CM | POA: Diagnosis not present

## 2022-02-20 DIAGNOSIS — Z86711 Personal history of pulmonary embolism: Secondary | ICD-10-CM | POA: Diagnosis not present

## 2022-02-20 DIAGNOSIS — G08 Intracranial and intraspinal phlebitis and thrombophlebitis: Secondary | ICD-10-CM | POA: Diagnosis not present

## 2022-02-20 LAB — VALPROIC ACID LEVEL: Valproic Acid Lvl: 68 ug/mL (ref 50.0–100.0)

## 2022-02-20 NOTE — Progress Notes (Signed)
PROGRESS NOTE    [name]  JSH:[id number] DOB: 01/26/1956 DOA: 02/12/2022 PCP: Lujean Amel, MD    Brief Narrative:  [name] is a 66 y.o. 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  She was watching TV and when she was trying to go to the bathroom she felt dizzy and blacked out.  Family had requested nursing home placement due to confusion and aggressive behavior.  In the ED, patient was noted to be orthostatic.  Laboratory data was unremarkable.  Patient was not taking her Depakote as prescribed and also Lovenox for 3 days.  CT head and C-spine negative for acute finding.  Patient does have history of recurrent falls recently.  Patient was then considered for admission to the hospital for further evaluation and treatment.   Assessment and Plan:  Syncope likely secondary to severe orthostatic hypotension Continue midodrine, compression stockings, fall precautions.  History of sinus pause status post pacemaker.  No aortic stenosis on previous echocardiogram done in February 2023.  TSH of 1.2.  Physical therapy has recommended skilled nursing facility on discharge.  Off coreg due to orthostatic hypotension.    Continue midodrine.  History of seizures.  Continue Depakote.   Possible UTI On IV Rocephin.  On Foley catheter due to bladder dysfunction and presented with Foley catheter on arrival..  Urine culture with staph epi.  Likely colonization.  Off Rocephin.  Will need to follow-up with urology as outpatient.  Urinary retention status post indwelling Foley catheter on presentation.  Patient is supposed to follow-up with urology as outpatient to address this.  Continue catheter while in the  hospital.  Anxiety/bipolar disorder/depression  Will continue Depakote, amitriptyline, risperidone.  Patient was on involuntary commitment and one-to-one sitter which has been discontinued by psychiatry.  Has remained stable at this time.  Patient is at her baseline at this time.  History of dural venous sinus thrombosis/PE. Continue therapeutic Lovenox    Chronic lumbar radiculopathy -Continue gabapentin    DVT prophylaxis: Place TED hose Start: 02/13/22 0824   Code Status:     Code Status: Full Code  Disposition:  Skilled nursing facility as per PT recommendation.  Medically stable for disposition.  Insurance authorization pending.  Status is: Inpatient  The patient is inpatient because: Multiple falls, awaiting for skilled nursing facility placement,   Family Communication:  Tried to reach the patient's daughter Ms. [name] today but was unable to reach her.  Consultants:  Psychiatry  Procedures:  None   Antimicrobials:  None   Subjective: Today, patient was seen and examined at bedside.  Denies any fever chills nausea vomiting.  Awaiting for placement.  Objective: Vitals:   02/19/22 2147 02/20/22 0530 02/20/22 0917 02/20/22 1044  BP: 126/76 (!) 121/91 110/81 108/75  Pulse: 80 92 91 96  Resp: '17 18 18   '$ Temp: 98.6 F (37 C) 98 F (36.7 C) (!) 97.4 F (36.3 C)   TempSrc: Oral Oral Oral   SpO2: 98% 99% 97%   Weight:   44.2 kg   Height:        Intake/Output Summary (Last 24 hours) at 02/20/2022 1404 Last data filed at 02/20/2022 0600 Gross per 24 hour  Intake 240 ml  Output 1000 ml  Net -760 ml  Filed Weights   02/12/22 2337 02/16/22 2106 02/20/22 0917  Weight: 54.4 kg 46.4 kg 44.2 kg    Physical Examination: Body mass index is 15.26 kg/m.   General: Alert awake and Communicative not in obvious distress, thinly built  HENT:   No scleral pallor or icterus noted. Oral mucosa is moist.  Chest: Clear breath sounds.  No crackles or wheezes. CVS:  S1 &S2 heard. No murmur.  Regular rate and rhythm. Abdomen: Soft, nontender, nondistended.  Bowel sounds are heard.   Extremities: No cyanosis, clubbing or edema.  Peripheral pulses are palpable.   Psych: Alert, awake, oriented, Communicative CNS:  No cranial nerve deficits.  No focal deficits Skin: Warm and dry.  No rashes noted.  Data Reviewed:   CBC: Recent Labs  Lab 02/14/22 0800 02/15/22 0627 02/16/22 0722 02/17/22 0338  WBC 6.6 5.6 5.5 6.0  NEUTROABS 3.5 2.2 2.0 2.2  HGB 12.7 12.5 12.5 12.7  HCT 39.4 37.8 37.3 37.4  MCV 90.6 90.2 88.4 88.6  PLT 231 251 229 239     Basic Metabolic Panel: Recent Labs  Lab 02/14/22 0800 02/15/22 0627 02/16/22 0722 02/17/22 0338  NA 133* 138 141 140  K 3.9 4.0 4.0 4.1  CL 101 106 109 104  CO2 '23 22 25 27  '$ GLUCOSE 87 75 81 79  BUN 7* '10 9 10  '$ CREATININE 0.79 0.90 0.76 0.80  CALCIUM 8.4* 8.5* 8.7* 9.2  MG 1.9 1.9 2.0 2.1     Liver Function Tests: Recent Labs  Lab 02/14/22 0800 02/15/22 0627 02/16/22 0722 02/17/22 0338  AST 12* 10* 11* 12*  ALT '10 8 8 9  '$ ALKPHOS 66 58 54 54  BILITOT 1.3* 1.0 0.7 0.7  PROT 5.5* 5.1* 5.4* 5.5*  ALBUMIN 3.1* 2.8* 3.1* 3.2*      Radiology Studies: No results found.    LOS: 6 days    Flora Lipps, MD [facility] Available via Epic secure chat 7am-7pm After these hours, please refer to coverage provider listed on [email or web address] 02/20/2022, 2:04 PM

## 2022-02-20 NOTE — Telephone Encounter (Signed)
  Pt is making an appt with Dr. Sallyanne Kuster. Advised first available is in March, offered soone available with APP at [facility] st office since APP in [place] first available is on 03/30/22. Pt got disconnected, tried calling back number it phone number says call cannot be completed

## 2022-02-20 NOTE — Progress Notes (Signed)
Physical Therapy Treatment Patient Details Name: [name] MRN: [id number] DOB: 06-08-1955 Today's Date: 02/20/2022   History of Present Illness [name] is a 66 y.o. female presented to the ED due to multiple recent falls.  Family requested nursing home placement due to patient having episodes of confusion and aggressive behavior.  In the ED, patient had severe orthostatic hypotension with blood pressure dropping from 153/97 supine to 86/62 standing.  No significant lab abnormalities except Depakote level subtherapeutic.  Family told RN that patient has not been taking her Depakote as prescribed.  In addition, she has not taken Lovenox for 3 days due to prescription running out.  CT head and C-spine negative for acute finding.    PT Comments    Further training towards acute rehab goals as tolerated, focusing efforts on transfer training, symptom awareness and repositioning. Min assist with hand held support to transfer from bed and take small steps to recliner during pivot. Standing limited due to dizziness, quickly elevated pt LEs in recliner and symptoms resolved rapidly. BP pre activity supine 121/85 HR 92. Post activity in recliner 98/74 HR 101. Sitting comfortably in chair eating lunch. Patient will continue to benefit from skilled physical therapy services to further improve independence with functional mobility.    Recommendations for follow up therapy are one component of a multi-disciplinary discharge planning process, led by the attending physician.  Recommendations may be updated based on patient status, additional functional criteria and insurance authorization.  Follow Up Recommendations  Skilled nursing-short term rehab (<3 hours/day) Can patient physically be transported by private vehicle: No (but perhaps soon when BPs with activity are stabilized)   Assistance Recommended at Discharge Frequent or constant Supervision/Assistance  Patient can return home with the  following Assistance with cooking/housework;Help with stairs or ramp for entrance;Assist for transportation;A lot of help with walking and/or transfers;A lot of help with bathing/dressing/bathroom;Direct supervision/assist for medications management;Direct supervision/assist for financial management   Equipment Recommendations  Rollator (4 wheels);BSC/3in1    Recommendations for Other Services       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 VC to facilitate. Several cues for repositioning in bed for orthostatics    Transfers Overall transfer level: Needs assistance Equipment used: 1 person hand held assist Transfers: Sit to/from Stand, Bed to chair/wheelchair/BSC Sit to Stand: Min assist   Step pivot transfers: Min assist       General transfer comment: Min assist for boost and balance to stand with HHA, step pivot transfer to chair. Mild dizziness reported, good control with descent into recliner. LEs elevated BP after sitting several minutes  98/74 HR 101, symptoms resolved.    Ambulation/Gait               General Gait Details: not appropriate 2/2 orthostatics   Stairs             Wheelchair Mobility    Modified Rankin (Stroke Patients Only)       Balance Overall balance assessment: Needs assistance Sitting-balance support: Feet supported, No upper extremity supported Sitting balance-Leahy Scale: Good Sitting balance - Comments: sits up in chair back unsupported.   Standing balance support: Single extremity supported, During functional activity  Standing balance-Leahy Scale: Poor Standing balance comment: posterior lean  at times                            Cognition Arousal/Alertness: Awake/alert Behavior During Therapy: WFL for tasks assessed/performed Overall Cognitive Status: Impaired/Different from baseline Area of Impairment: Orientation, Following commands, Safety/judgement, Problem solving                 Orientation Level: Disoriented to, Time Current Attention Level: Sustained   Following Commands: Follows one step commands consistently Safety/Judgement: Decreased awareness of safety, Decreased awareness of deficits   Problem Solving: Slow processing, Decreased initiation General Comments: Pleasantly confused        Exercises      General Comments        Pertinent Vitals/Pain Pain Assessment Pain Assessment: No/denies pain Pain Intervention(s): Monitored during session    Home Living                          Prior Function            PT Goals (current goals can now be found in the care plan section) Acute Rehab PT Goals Patient Stated Goal: Unable to state PT Goal Formulation: Patient unable to participate in goal setting Time For Goal Achievement: 02/28/22 Potential to Achieve Goals: Good Progress towards PT goals: Progressing toward goals    Frequency    Min 2X/week      PT Plan Current plan remains appropriate    Co-evaluation              AM-PAC PT "6 Clicks" Mobility   Outcome Measure  Help needed turning from your back to your side while in a flat bed without using bedrails?: None Help needed moving from lying on your back to sitting on the side of a flat bed without using bedrails?: A Little Help needed moving to and from a bed to a chair (including a wheelchair)?: A Little Help needed standing up from a chair using your arms (e.g., wheelchair or bedside chair)?: A Little Help needed to walk in hospital room?: Total Help needed climbing 3-5 steps with a railing? : Total 6 Click Score: 15    End of Session  Equipment Utilized During Treatment: Gait belt Activity Tolerance: Treatment limited secondary to medical complications (Comment) (orthostatic hypotension, symptomatic) Patient left: with call bell/phone within reach;in chair;with chair alarm set Nurse Communication: Mobility status (BP) PT Visit Diagnosis: Unsteadiness on feet (R26.81);Other abnormalities of gait and mobility (R26.89)     Time: 1135-1200 PT Time Calculation (min) (ACUTE ONLY): 25 min  Charges:  $Therapeutic Activity: 23-37 mins                     Candie Mile, PT, DPT Physical Therapist Acute Rehabilitation Services [place] 02/20/2022, 12:15 PM

## 2022-02-20 NOTE — Progress Notes (Signed)
Valproic Acid Pharmacy Consult:  Labs: Albumin  Date Value Ref Range Status  02/17/2022 3.2 (L) 3.5 - 5.0 g/dL Final  02/25/2020 3.4 (L) 3.8 - 4.8 g/dL Final   AST  Date Value Ref Range Status  02/17/2022 12 (L) 15 - 41 U/L Final  02/16/2022 11 (L) 15 - 41 U/L Final  02/15/2022 10 (L) 15 - 41 U/L Final  12/05/2021 19 15 - 41 U/L Final   ALT  Date Value Ref Range Status  02/17/2022 9 0 - 44 U/L Final  02/16/2022 8 0 - 44 U/L Final  02/15/2022 8 0 - 44 U/L Final  12/05/2021 29 0 - 44 U/L Final   Ammonia  Date Value Ref Range Status  11/23/2021 131 (H) 9 - 35 umol/L Final    Comment:    Performed at [facility], [place] [address], [place], [place] [address]  03/08/2021 15 9 - 35 umol/L Final    Comment:    Performed at [facility], [place] [address], [place], [place] [address]  03/22/2020 13 9 - 35 umol/L Final    Comment:    Performed at [facility], [place] [address], [place], [place] [address]   Platelets  Date Value Ref Range Status  02/17/2022 239 150 - 400 K/uL Final  02/16/2022 229 150 - 400 K/uL Final  02/15/2022 251 150 - 400 K/uL Final   Platelet Count  Date Value Ref Range Status  12/05/2021 209 150 - 400 K/uL Final   Valproic Acid Lvl  Date Value Ref Range Status  02/16/2022 78 50.0 - 100.0 ug/mL Final    Comment:    Performed at [facility], [place] [address], [place], [place] [address]  02/13/2022 34 (L) 50.0 - 100.0 ug/mL Final    Comment:    Performed at [place] [address] [place], [place] [address]  01/27/2022 <10 (L) 50.0 - 100.0 ug/mL Final    Comment:    Performed at [facility], [place] [address] [place], [place] [address]    Assessment:  [name] is a 66 y.o. female admitted on 02/12/22 with severe orthostatic hypotension.  Pharmacy has been consulted to dose valproic acid for Continuation of therapy.   Indication   Bipolar Disorder    PTA Dosing    Formulation: Divalproex DR PO   Dose/Frequency: '250mg'$  PO q8 hours    Goal Levels  Total Levels (serum: unbound and bound)  Seizures: 50-100 mcg/mL   Mania: 50-125 mcg/mL   Free Levels (serum: unbound)   Seizures/Mania: 5-15 mcg/mL     Dose adjustments this admission:  Patient was switched from IV valproate >> PO divalproex on 11/30. 12/1 VPA level 78, however was drawn ~2 hours post PO dose. Confirmatory level 12/5 therapeutic at 68 (drawn ~10 min prior to dose - likely an accurate trough)   Plan: Continue divalproex DR '250mg'$  PO q8 hours  Thank you for allowing pharmacy to be a part of this patient's care.  Dimple Nanas, PharmD, BCPS 02/20/2022 7:21 AM

## 2022-02-20 NOTE — Progress Notes (Signed)
Mobility Specialist Progress Note:   02/20/22 1030  Orthostatic Lying   BP- Lying 110/79  Pulse- Lying 91  Orthostatic Sitting  BP- Sitting (!) 69/51  Pulse- Sitting 90  Mobility  Activity Dangled on edge of bed  Level of Assistance Modified independent, requires aide device or extra time  Assistive Device None  Distance Ambulated (ft)  (unable d/t orthostatic hypotension)  Activity Response Tolerated fair  Mobility Referral Yes  $Mobility charge 1 Mobility   Pt agreeable to mobility session. Still limited by orthostatic hypotension (listed above). Further mobility deferred, pt with nausea upon sitting EOB. Left in bed with all needs met, RN in to assess. Bed alarm on.   Nelta Numbers Mobility Specialist Please contact via SecureChat or  Rehab office at [phone]

## 2022-02-20 NOTE — TOC Progression Note (Signed)
Transition of Care [facility]) - Initial/Assessment Note    Patient Details  Name: [name] MRN: [id number] Date of Birth: 1956/03/04  Transition of Care [facility]) CM/SW Contact:    Milinda Antis, LCSWA Phone Number: 02/20/2022, 8:49 AM  Clinical Narrative:                 [facility] (SNF) can accept.  Insurance auth being initiated.  Pending insurance auth.  Expected Discharge Plan: Skilled Nursing Facility Barriers to Discharge: Continued Medical Work up, SNF Pending bed offer   Patient Goals and CMS Choice Patient states their goals for this hospitalization and ongoing recovery are:: get back to what I was CMS Medicare.gov Compare Post Acute Care list provided to:: Patient Choice offered to / list presented to : Patient, Adult Children  Expected Discharge Plan and Services Expected Discharge Plan: [place] In-house Referral: Clinical Social Work   Post Acute Care Choice: [place] Living arrangements for the past 2 months: [place]                                      Prior Living Arrangements/Services Living arrangements for the past 2 months: Single Family Home Lives with:: Adult Children (lives with Investment banker, operational) Patient language and need for interpreter reviewed:: Yes Do you feel safe going back to the place where you live?: Yes      Need for Family Participation in Patient Care: Yes (Comment) Care giver support system in place?: Yes (comment) Current home services: Other (comment) (none) Criminal Activity/Legal Involvement Pertinent to Current Situation/Hospitalization: No - Comment as needed  Activities of Daily Living Home Assistive Devices/Equipment: None ADL Screening (condition at time of admission) Patient's cognitive ability adequate to safely complete daily activities?: No Is the patient deaf or have difficulty hearing?: No Does the patient have difficulty seeing, even when wearing glasses/contacts?: No Does  the patient have difficulty concentrating, remembering, or making decisions?: Yes Patient able to express need for assistance with ADLs?: Yes Does the patient have difficulty dressing or bathing?: Yes Independently performs ADLs?: No Communication: Independent Dressing (OT): Needs assistance Is this a change from baseline?: Pre-admission baseline Grooming: Needs assistance Is this a change from baseline?: Pre-admission baseline Feeding: Needs assistance Is this a change from baseline?: Pre-admission baseline Bathing: Dependent Is this a change from baseline?: Pre-admission baseline Toileting: Dependent Is this a change from baseline?: Pre-admission baseline In/Out Bed: Dependent Is this a change from baseline?: Pre-admission baseline Walks in Home: Dependent Is this a change from baseline?: Pre-admission baseline Does the patient have difficulty walking or climbing stairs?: Yes Weakness of Legs: Both Weakness of Arms/Hands: None  Permission Sought/Granted Permission sought to share information with : Family Supports Permission granted to share information with : Yes, Verbal Permission Granted  Share Information with NAME: daughters [name] and [name]           Emotional Assessment Appearance:: Appears stated age Attitude/Demeanor/Rapport: Engaged Affect (typically observed): Pleasant Orientation: : Oriented to Self, Oriented to Place   Psych Involvement: Yes (comment)  Admission diagnosis:  Orthostatic hypotension [I95.1] Contusion of occipital region of scalp, initial encounter [S00.03XA] Fall at home, initial encounter [W19.XXXA, Y92.009] Syncope [R55] Patient Active Problem List   Diagnosis Date Noted   Orthostatic hypotension 02/13/2022   Lower abdominal pain 02/13/2022   Acute encephalopathy 11/22/2021   Gross hematuria 11/11/2021   Aortic atherosclerosis ([place]) 11/11/2021  Cholelithiasis 11/11/2021   Hyperlipidemia 11/11/2021   Supratherapeutic INR 11/07/2021    History of pulmonary embolism 11/07/2021   Cerebral venous thrombosis of sigmoid sinus 11/07/2021   Seizure-like activity ([place]) 11/07/2021   Acute deep vein thrombosis (DVT) of left peroneal vein ([place]) 10/27/2021   Acute pulmonary embolism without acute cor pulmonale (HCC) 10/27/2021   Jugular vein thrombosis, left 10/27/2021   Acute cystitis 10/26/2021   Severe protein-calorie malnutrition ([place]) 10/25/2021   Electrolyte abnormality 05/04/2021   Altered mental status 05/04/2021   fall with rhabdomyloysis  05/03/2021   Leukocytosis 05/03/2021   Chronic pain 05/03/2021   Underweight 05/03/2021   Lactic acidosis 09/47/0962   Acute metabolic encephalopathy 83/66/2947   Degeneration of lumbar intervertebral disc 08/16/2020   Sepsis secondary to UTI ([place]) 04/17/2020   Hypoglycemia without diagnosis of diabetes mellitus 04/17/2020   Sepsis due to pneumonia ([place]) 04/17/2020   Physical deconditioning    Myositis 03/03/2020   HCAP (healthcare-associated pneumonia) 02/21/2020   Generalized weakness 02/21/2020   GAD (generalized anxiety disorder) 02/21/2020   Proximal muscle weakness 02/08/2020   Dysphagia 02/08/2020   Gait disturbance 01/04/2020   Elevated CK 01/04/2020   Weakness of both lower extremities 01/04/2020   Encephalopathy    Polypharmacy    Fall 12/18/2019   Hypokalemia    Abnormal liver function test    Cervical post-laminectomy syndrome 10/21/2019   Cervical spondylosis 09/23/2019   Memory loss 07/02/2019   Depression with anxiety 07/02/2019   B12 deficiency 07/02/2019   Lumbar radiculopathy 02/10/2019   History of fusion of lumbar spine 02/10/2019   Dysesthesia 02/10/2019   Retroperitoneal fibrosis    Pelvic adhesions    Left ovarian cyst 11/17/2018   Elevated tumor markers 11/17/2018   Dyspnea 09/15/2018   Laryngopharyngeal reflux (LPR) 05/19/2018   Bipolar disorder ([place]) 04/16/2018   Insomnia 04/16/2018   Attention deficit hyperactivity disorder (ADHD)  04/16/2018   Pain in right knee 04/04/2017   Neck pain on right side 09/09/2014   Pseudoarthrosis of lumbar spine 04/15/2014   Family history of arrhythmogenic right ventricular cardiomyopathy 03/30/2013   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 02/20/2013   Sinus pause 02/14/2013   Syncope 10/29/2012   Hx of syncope- s/p Loop recorder 11/06/12 10/29/2012   Vertigo 10/29/2012   Tobacco abuse 10/29/2012   BENIGN POSITIONAL VERTIGO 11/14/2009   EAR PAIN, RIGHT 11/14/2009   SCIATICA 05/09/2009   PCP:  Lujean Amel, MD Pharmacy:   [id number] [place] [address]Phone: [phone]: [address]Phone: [phone]: [phone]    Social Determinants of Health (SDOH) Interventions    Readmission Risk Interventions    11/13/2021   10:54 AM  Readmission Risk Prevention Plan  Transportation Screening Complete  PCP or Specialist Appt within 3-5 Days Complete  HRI or [place]  Social Work Consult for [place]anning/Counseling Complete  Palliative Care Screening Not Applicable  Medication Review (Press photographer Complete

## 2022-02-21 DIAGNOSIS — I951 Orthostatic hypotension: Secondary | ICD-10-CM | POA: Diagnosis not present

## 2022-02-21 LAB — CBC
HCT: 41.2 % (ref 36.0–46.0)
Hemoglobin: 13.9 g/dL (ref 12.0–15.0)
MCH: 29.8 pg (ref 26.0–34.0)
MCHC: 33.7 g/dL (ref 30.0–36.0)
MCV: 88.4 fL (ref 80.0–100.0)
Platelets: 220 10*3/uL (ref 150–400)
RBC: 4.66 MIL/uL (ref 3.87–5.11)
RDW: 13.1 % (ref 11.5–15.5)
WBC: 5.8 10*3/uL (ref 4.0–10.5)
nRBC: 0 % (ref 0.0–0.2)

## 2022-02-21 LAB — BASIC METABOLIC PANEL
Anion gap: 6 (ref 5–15)
BUN: 17 mg/dL (ref 8–23)
CO2: 28 mmol/L (ref 22–32)
Calcium: 9.1 mg/dL (ref 8.9–10.3)
Chloride: 101 mmol/L (ref 98–111)
Creatinine, Ser: 0.78 mg/dL (ref 0.44–1.00)
GFR, Estimated: >60 mL/min (ref >60)
Glucose, Bld: 81 mg/dL (ref 70–99)
Potassium: 4.8 mmol/L (ref 3.5–5.1)
Sodium: 135 mmol/L (ref 135–145)

## 2022-02-21 LAB — MAGNESIUM: Magnesium: 2 mg/dL (ref 1.7–2.4)

## 2022-02-21 MED ORDER — POLYVINYL ALCOHOL 1.4 % OP SOLN
1.0000 [drp] | OPHTHALMIC | Status: DC | PRN
  Administered 2022-02-21: 1 [drp] via OPHTHALMIC
  Filled 2022-02-21: qty 15

## 2022-02-21 NOTE — Progress Notes (Signed)
Mobility Specialist Progress Note:   02/21/22 0920  Mobility  Activity Transferred from chair to bed  Level of Assistance Minimal assist, patient does 75% or more  Assistive Device Other (Comment) (HHA)  Distance Ambulated (ft) 3 ft  Activity Response Tolerated fair  Mobility Referral Yes  $Mobility charge 1 Mobility   Post Mobility: BP 103/75; HR 98bpm  Pt eager to return to bed. Eyes closed entire session d/t burning. Pt required minA via HHA to transfer d/t eyes closed. C/o dizziness upon return to bed, BP 103/75. Left in bed with all needs met, bed alarm on.  Nelta Numbers Mobility Specialist Please contact via SecureChat or  Rehab office at [phone]

## 2022-02-21 NOTE — Progress Notes (Signed)
Occupational Therapy Treatment Patient Details Name: [name] MRN: [id number] DOB: 1955-09-13 Today's Date: 02/21/2022   History of present illness [name] is a 66 y.o. female presented to the ED due to multiple recent falls.  Family requested nursing home placement due to patient having episodes of confusion and aggressive behavior.  In the ED, patient had severe orthostatic hypotension with blood pressure dropping from 153/97 supine to 86/62 standing.  No significant lab abnormalities except Depakote level subtherapeutic.  Family told RN that patient has not been taking her Depakote as prescribed.  In addition, she has not taken Lovenox for 3 days due to prescription running out.  CT head and C-spine negative for acute finding.   OT comments  Patient a little more confused this date, however the primary deficit is bilateral eyes burning.  Uncomfortable for her to keep her eyes open, so HHA for all mobility and setup of breakfast tray.  SNF has been recommended due to continued balance deficits, and safety concerns.  OT will continue to follow in the acute setting.      Recommendations for follow up therapy are one component of a multi-disciplinary discharge planning process, led by the attending physician.  Recommendations may be updated based on patient status, additional functional criteria and insurance authorization.    Follow Up Recommendations  Skilled nursing-short term rehab (<3 hours/day)     Assistance Recommended at Discharge Intermittent Supervision/Assistance  Patient can return home with the following  Assist for transportation;Direct supervision/assist for medications management;Assistance with cooking/housework;A little help with walking and/or transfers   Equipment Recommendations  None recommended by OT    Recommendations for Other Services      Precautions / Restrictions Precautions Precautions: Fall Precaution Comments: Orthostatic  hypotension Restrictions Weight Bearing Restrictions: No       Mobility Bed Mobility Overal bed mobility: Needs Assistance Bed Mobility: Supine to Sit     Supine to sit: Supervision          Transfers Overall transfer level: Needs assistance Equipment used: 1 person hand held assist Transfers: Sit to/from Stand, Bed to chair/wheelchair/BSC Sit to Stand: Min assist     Step pivot transfers: Min assist           Balance Overall balance assessment: Needs assistance Sitting-balance support: Feet supported, No upper extremity supported Sitting balance-Leahy Scale: Good     Standing balance support: Single extremity supported Standing balance-Leahy Scale: Fair                             ADL either performed or assessed with clinical judgement   ADL       Grooming: Wash/dry hands;Wash/dry face;Set up;Standing               Lower Body Dressing: Min guard;Sit to/from stand   Toilet Transfer: Minimal assistance;Ambulation;Regular Glass blower/designer Details (indicate cue type and reason): HHA due to difficulties keeping her eyes open                Extremity/Trunk Assessment Upper Extremity Assessment Upper Extremity Assessment: Overall WFL for tasks assessed   Lower Extremity Assessment Lower Extremity Assessment: Defer to PT evaluation        Vision Patient Visual Report: Eye fatigue/eye pain/headache     Perception     Praxis      Cognition Arousal/Alertness: Awake/alert Behavior During Therapy: WFL for tasks assessed/performed Overall Cognitive Status: Impaired/Different from baseline  Orientation Level: Disoriented to, Time, Situation     Following Commands: Follows one step commands consistently       General Comments: more confused than prior Ot session.  Thinking it was 11:30, and she needed to get the kids to school        Exercises      Shoulder Instructions       General  Comments      Pertinent Vitals/ Pain       Pain Assessment Pain Assessment: Faces Faces Pain Scale: Hurts little more Pain Location: eyes burning Pain Descriptors / Indicators: Other (Comment) (stinging) Pain Intervention(s): Monitored during session                                                          Frequency  Min 2X/week        Progress Toward Goals  OT Goals(current goals can now be found in the care plan section)  Progress towards OT goals: Progressing toward goals  Acute Rehab OT Goals OT Goal Formulation: With patient Time For Goal Achievement: 03/01/22 Potential to Achieve Goals: [place] Discharge plan needs to be updated    Co-evaluation                 AM-PAC OT "6 Clicks" Daily Activity     Outcome Measure   Help from another person eating meals?: None Help from another person taking care of personal grooming?: None Help from another person toileting, which includes using toliet, bedpan, or urinal?: A Little Help from another person bathing (including washing, rinsing, drying)?: A Little Help from another person to put on and taking off regular upper body clothing?: None Help from another person to put on and taking off regular lower body clothing?: A Little 6 Click Score: 21    End of Session    OT Visit Diagnosis: Unsteadiness on feet (R26.81)   Activity Tolerance Patient tolerated treatment well   Patient Left in chair;with call bell/phone within reach;with chair alarm set   Nurse Communication Mobility status        Time: 0800-0820 OT Time Calculation (min): 20 min  Charges: OT General Charges $OT Visit: 1 Visit OT Treatments $Self Care/Home Management : 8-22 mins  02/21/2022  RP, OTR/L  Acute Rehabilitation Services  Office:  [phone]   Metta Clines 02/21/2022, 8:33 AM

## 2022-02-21 NOTE — Progress Notes (Signed)
PROGRESS NOTE    [name]  DGL:[id number] DOB: 05-20-1955 DOA: 02/12/2022 PCP: Lujean Amel, MD   Brief Narrative:   This 66 yrs old female with PMH significant of anxiety, bipolar disorder, seizure disorder, depression, hyperlipidemia, dural venous sinus thrombosis, history of pulmonary embolism in August 2023 on Lovenox,  recurrent syncope and sinus pauses status post pacemaker, lumbar radiculopathy presented to the emergency department with syncope.  She was watching TV and when she was trying to go to the bathroom, She felt dizzy and blacked out.  Family had requested nursing home placement due to confusion and aggressive behavior.  In the ED, patient was noted to be orthostatic. Laboratory data was unremarkable. Patient was not taking her Depakote as prescribed and also Lovenox for 3 days. CT head and C-spine negative for acute finding.  Patient does have history of recurrent falls recently.  Patient was then considered for admission to the hospital for further evaluation and treatment.   Assessment & Plan:   Principal Problem:   Orthostatic hypotension Active Problems:   History of pulmonary embolism   Cerebral venous thrombosis of sigmoid sinus   Syncope   Bipolar disorder (HCC)   Lumbar radiculopathy   Depression with anxiety   Lower abdominal pain  Syncope likely sec. to severe orthostatic hypotension: Continue midodrine, compression stockings, fall precautions. History of sinus pauses status post pacemaker.   No aortic stenosis on previous echo done in February 2023.  TSH of 1.2.   Physical therapy has recommended skilled nursing facility on discharge.   Coreg discontinued due to orthostatic hypotension.   Continue midodrine.   History of seizure disorder: Continue Depakote.   Probable UTI: Initiated on IV Rocephin. She presented with Foley catheter on arrival.  Due to bladder dysfunction.   Urine culture with staph epi.  Likely colonization.  IV Rocephin  discontinued..   Will need to follow-up with urology as outpatient.   Urinary retention status post indwelling Foley catheter on arrival: Patient is supposed to follow-up with urology as outpatient to address this.   Continue catheter while in the hospital.   Anxiety / Bipolar disorder / Depression: Continue Depakote, amitriptyline, risperidone.   Patient was on involuntary commitment and one-to-one sitter which has been discontinued by psychiatry.   Has remained stable at this time.  Patient is at her baseline at this time.   History of dural venous sinus thrombosis /PE: Continue therapeutic Lovenox.   Chronic lumbar radiculopathy: Continue gabapentin   DVT prophylaxis: Lovenox Code Status: Full code Family Communication: No family at bed side Disposition Plan:   Status is: Inpatient Remains inpatient appropriate because: Medically stable for disposition.  Insurance authorization pending   Consultants:  Psychiatry  Procedures: None Antimicrobials: None  Subjective: Patient was seen and examined at bedside.  Overnight events noted. Patient denies any symptoms and states she is doing much better and wants to be discharged.   Awaiting insurance authorization for SNF placement.  Objective: Vitals:   02/20/22 1044 02/20/22 2153 02/21/22 0510 02/21/22 0944  BP: 108/75 116/84 110/73 114/79  Pulse: 96 90 88 95  Resp:  '18 18 18  '$ Temp:  97.6 F (36.4 C) (!) 97.3 F (36.3 C) 97.6 F (36.4 C)  TempSrc:  Oral Oral Oral  SpO2:  98% 96% 98%  Weight:      Height:        Intake/Output Summary (Last 24 hours) at 02/21/2022 1321 Last data filed at 02/21/2022 0900 Gross per 24 hour  Intake  170 ml  Output 1150 ml  Net -980 ml   Filed Weights   02/12/22 2337 02/16/22 2106 02/20/22 0917  Weight: 54.4 kg 46.4 kg 44.2 kg    Examination:  General exam: Appears comfortable, not in any acute distress.  Deconditioned Respiratory system: CTA bilaterally, respiratory effort  normal, RR 15 Cardiovascular system: S1 & S2 heard, regular rate and rhythm, no murmur. Gastrointestinal system: Abdomen is soft, non tender, non distended, BS+ Central nervous system: Alert and oriented X 3. No focal neurological deficits. Extremities: No edema, no cyanosis, no clubbing Skin: No rashes, lesions or ulcers Psychiatry: Judgement and insight appear normal. Mood & affect appropriate.     Data Reviewed: I have personally reviewed following labs and imaging studies  CBC: Recent Labs  Lab 02/15/22 0627 02/16/22 0722 02/17/22 0338 02/21/22 0451  WBC 5.6 5.5 6.0 5.8  NEUTROABS 2.2 2.0 2.2  --   HGB 12.5 12.5 12.7 13.9  HCT 37.8 37.3 37.4 41.2  MCV 90.2 88.4 88.6 88.4  PLT 251 229 239 454   Basic Metabolic Panel: Recent Labs  Lab 02/15/22 0627 02/16/22 0722 02/17/22 0338 02/21/22 0451  NA 138 141 140 135  K 4.0 4.0 4.1 4.8  CL 106 109 104 101  CO2 '22 25 27 28  '$ GLUCOSE 75 81 79 81  BUN '10 9 10 17  '$ CREATININE 0.90 0.76 0.80 0.78  CALCIUM 8.5* 8.7* 9.2 9.1  MG 1.9 2.0 2.1 2.0   GFR: Estimated Creatinine Clearance: 49.6 mL/min (by C-G formula based on SCr of 0.78 mg/dL). Liver Function Tests: Recent Labs  Lab 02/15/22 0627 02/16/22 0722 02/17/22 0338  AST 10* 11* 12*  ALT '8 8 9  '$ ALKPHOS 58 54 54  BILITOT 1.0 0.7 0.7  PROT 5.1* 5.4* 5.5*  ALBUMIN 2.8* 3.1* 3.2*   No results for input(s): "LIPASE", "AMYLASE" in the last 168 hours. No results for input(s): "AMMONIA" in the last 168 hours. Coagulation Profile: No results for input(s): "INR", "PROTIME" in the last 168 hours. Cardiac Enzymes: No results for input(s): "CKTOTAL", "CKMB", "CKMBINDEX", "TROPONINI" in the last 168 hours. BNP (last 3 results) No results for input(s): "PROBNP" in the last 8760 hours. HbA1C: No results for input(s): "HGBA1C" in the last 72 hours. CBG: No results for input(s): "GLUCAP" in the last 168 hours. Lipid Profile: No results for input(s): "CHOL", "HDL", "LDLCALC",  "TRIG", "CHOLHDL", "LDLDIRECT" in the last 72 hours. Thyroid Function Tests: No results for input(s): "TSH", "T4TOTAL", "FREET4", "T3FREE", "THYROIDAB" in the last 72 hours. Anemia Panel: No results for input(s): "VITAMINB12", "FOLATE", "FERRITIN", "TIBC", "IRON", "RETICCTPCT" in the last 72 hours. Sepsis Labs: No results for input(s): "PROCALCITON", "LATICACIDVEN" in the last 168 hours.  Recent Results (from the past 240 hour(s))  Culture, Urine (Do not remove urinary catheter, catheter placed by urology or difficult to place)     Status: Abnormal   Collection Time: 02/14/22 12:09 PM   Specimen: Urine, Catheterized  Result Value Ref Range Status   Specimen Description URINE, CATHETERIZED  Final   Special Requests   Final    NONE Performed at [facility], [address], [place], [place] [address]    Culture >=100,000 COLONIES/mL STAPHYLOCOCCUS EPIDERMIDIS (A)  Final   Report Status 02/17/2022 FINAL  Final   Organism ID, Bacteria STAPHYLOCOCCUS EPIDERMIDIS (A)  Final      Susceptibility   Staphylococcus epidermidis - MIC*    CIPROFLOXACIN >=8 RESISTANT Resistant     GENTAMICIN <=0.5 SENSITIVE Sensitive  NITROFURANTOIN <=16 SENSITIVE Sensitive     OXACILLIN >=4 RESISTANT Resistant     TETRACYCLINE >=16 RESISTANT Resistant     VANCOMYCIN 1 SENSITIVE Sensitive     TRIMETH/SULFA 80 RESISTANT Resistant     CLINDAMYCIN >=8 RESISTANT Resistant     RIFAMPIN <=0.5 SENSITIVE Sensitive     Inducible Clindamycin NEGATIVE Sensitive     * >=100,000 COLONIES/mL STAPHYLOCOCCUS EPIDERMIDIS    Radiology Studies: No results found.  Scheduled Meds:  amitriptyline  50 mg Oral QHS   Chlorhexidine Gluconate Cloth  6 each Topical Daily   divalproex  250 mg Oral Q8H   enoxaparin  50 mg Subcutaneous BID   gabapentin  100 mg Oral BID   midodrine  2.5 mg Oral TID WC   mouth rinse  15 mL Mouth Rinse 4 times per day   risperiDONE  3 mg Oral QHS   Continuous Infusions:   LOS: 7 days     Time spent: 50 mins    Kirsi Hugh, MD [facility]   If 7PM-7AM, please contact night-coverage

## 2022-02-21 NOTE — TOC Progression Note (Signed)
Transition of Care [facility]) - Initial/Assessment Note    Patient Details  Name: [name] MRN: [id number] Date of Birth: Sep 24, 1955  Transition of Care [facility]) CM/SW Contact:    Milinda Antis, [place] Phone Number: 02/21/2022, 10:09 AM  Clinical Narrative:                 Insurance Josem Kaufmann is still pending at this time.    TOC will continue to follow.   Expected Discharge Plan: Skilled Nursing Facility Barriers to Discharge: Continued Medical Work up, SNF Pending bed offer   Patient Goals and CMS Choice Patient states their goals for this hospitalization and ongoing recovery are:: get back to what I was CMS Medicare.gov Compare Post Acute Care list provided to:: Patient Choice offered to / list presented to : Patient, Adult Children  Expected Discharge Plan and Services Expected Discharge Plan: [place] In-house Referral: Clinical Social Work   Post Acute Care Choice: [place] Living arrangements for the past 2 months: [place]                                      Prior Living Arrangements/Services Living arrangements for the past 2 months: Single Family Home Lives with:: Adult Children (lives with Investment banker, operational) Patient language and need for interpreter reviewed:: Yes Do you feel safe going back to the place where you live?: Yes      Need for Family Participation in Patient Care: Yes (Comment) Care giver support system in place?: Yes (comment) Current home services: Other (comment) (none) Criminal Activity/Legal Involvement Pertinent to Current Situation/Hospitalization: No - Comment as needed  Activities of Daily Living Home Assistive Devices/Equipment: None ADL Screening (condition at time of admission) Patient's cognitive ability adequate to safely complete daily activities?: No Is the patient deaf or have difficulty hearing?: No Does the patient have difficulty seeing, even when wearing glasses/contacts?: No Does the  patient have difficulty concentrating, remembering, or making decisions?: Yes Patient able to express need for assistance with ADLs?: Yes Does the patient have difficulty dressing or bathing?: Yes Independently performs ADLs?: No Communication: Independent Dressing (OT): Needs assistance Is this a change from baseline?: Pre-admission baseline Grooming: Needs assistance Is this a change from baseline?: Pre-admission baseline Feeding: Needs assistance Is this a change from baseline?: Pre-admission baseline Bathing: Dependent Is this a change from baseline?: Pre-admission baseline Toileting: Dependent Is this a change from baseline?: Pre-admission baseline In/Out Bed: Dependent Is this a change from baseline?: Pre-admission baseline Walks in Home: Dependent Is this a change from baseline?: Pre-admission baseline Does the patient have difficulty walking or climbing stairs?: Yes Weakness of Legs: Both Weakness of Arms/Hands: None  Permission Sought/Granted Permission sought to share information with : Family Supports Permission granted to share information with : Yes, Verbal Permission Granted  Share Information with NAME: daughters [name] and [name]           Emotional Assessment Appearance:: Appears stated age Attitude/Demeanor/Rapport: Engaged Affect (typically observed): Pleasant Orientation: : Oriented to Self, Oriented to Place   Psych Involvement: Yes (comment)  Admission diagnosis:  Orthostatic hypotension [I95.1] Contusion of occipital region of scalp, initial encounter [S00.03XA] Fall at home, initial encounter [W19.XXXA, Y92.009] Syncope [R55] Patient Active Problem List   Diagnosis Date Noted   Orthostatic hypotension 02/13/2022   Lower abdominal pain 02/13/2022   Acute encephalopathy 11/22/2021   Gross hematuria 11/11/2021   Aortic atherosclerosis (  [place]) 11/11/2021   Cholelithiasis 11/11/2021   Hyperlipidemia 11/11/2021   Supratherapeutic INR 11/07/2021    History of pulmonary embolism 11/07/2021   Cerebral venous thrombosis of sigmoid sinus 11/07/2021   Seizure-like activity ([place]) 11/07/2021   Acute deep vein thrombosis (DVT) of left peroneal vein ([place]) 10/27/2021   Acute pulmonary embolism without acute cor pulmonale (HCC) 10/27/2021   Jugular vein thrombosis, left 10/27/2021   Acute cystitis 10/26/2021   Severe protein-calorie malnutrition ([place]) 10/25/2021   Electrolyte abnormality 05/04/2021   Altered mental status 05/04/2021   fall with rhabdomyloysis  05/03/2021   Leukocytosis 05/03/2021   Chronic pain 05/03/2021   Underweight 05/03/2021   Lactic acidosis 42/70/6237   Acute metabolic encephalopathy 62/83/1517   Degeneration of lumbar intervertebral disc 08/16/2020   Sepsis secondary to UTI ([place]) 04/17/2020   Hypoglycemia without diagnosis of diabetes mellitus 04/17/2020   Sepsis due to pneumonia ([place]) 04/17/2020   Physical deconditioning    Myositis 03/03/2020   HCAP (healthcare-associated pneumonia) 02/21/2020   Generalized weakness 02/21/2020   GAD (generalized anxiety disorder) 02/21/2020   Proximal muscle weakness 02/08/2020   Dysphagia 02/08/2020   Gait disturbance 01/04/2020   Elevated CK 01/04/2020   Weakness of both lower extremities 01/04/2020   Encephalopathy    Polypharmacy    Fall 12/18/2019   Hypokalemia    Abnormal liver function test    Cervical post-laminectomy syndrome 10/21/2019   Cervical spondylosis 09/23/2019   Memory loss 07/02/2019   Depression with anxiety 07/02/2019   B12 deficiency 07/02/2019   Lumbar radiculopathy 02/10/2019   History of fusion of lumbar spine 02/10/2019   Dysesthesia 02/10/2019   Retroperitoneal fibrosis    Pelvic adhesions    Left ovarian cyst 11/17/2018   Elevated tumor markers 11/17/2018   Dyspnea 09/15/2018   Laryngopharyngeal reflux (LPR) 05/19/2018   Bipolar disorder ([place]) 04/16/2018   Insomnia 04/16/2018   Attention deficit hyperactivity disorder (ADHD)  04/16/2018   Pain in right knee 04/04/2017   Neck pain on right side 09/09/2014   Pseudoarthrosis of lumbar spine 04/15/2014   Family history of arrhythmogenic right ventricular cardiomyopathy 03/30/2013   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 02/20/2013   Sinus pause 02/14/2013   Syncope 10/29/2012   Hx of syncope- s/p Loop recorder 11/06/12 10/29/2012   Vertigo 10/29/2012   Tobacco abuse 10/29/2012   BENIGN POSITIONAL VERTIGO 11/14/2009   EAR PAIN, RIGHT 11/14/2009   SCIATICA 05/09/2009   PCP:  Lujean Amel, MD Pharmacy:   [id number] [place] [place] [address]Phone: [phone]: [address]Phone: [phone]: [phone]    Social Determinants of Health (SDOH) Interventions    Readmission Risk Interventions    11/13/2021   10:54 AM  Readmission Risk Prevention Plan  Transportation Screening Complete  PCP or Specialist Appt within 3-5 Days Complete  [place] or [place]  Social Work Consult for [place]anning/Counseling Complete  Palliative Care Screening Not Applicable  Medication Review (Press photographer Complete

## 2022-02-22 DIAGNOSIS — I951 Orthostatic hypotension: Secondary | ICD-10-CM | POA: Diagnosis not present

## 2022-02-22 MED ORDER — ALPRAZOLAM 0.5 MG PO TABS
0.5000 mg | ORAL_TABLET | Freq: Two times a day (BID) | ORAL | Status: DC | PRN
  Administered 2022-02-22: 0.5 mg via ORAL
  Filled 2022-02-22: qty 1

## 2022-02-22 NOTE — Progress Notes (Signed)
Notified Dr. Dwyane Dee that patient is restless, seems a little confused and keeps taking her tele leads off stating that they are bothering her. MD gave order to discontinue tele monitor.

## 2022-02-22 NOTE — Progress Notes (Signed)
PT Cancellation Note  Patient Details Name: [name] MRN: [id number] DOB: December 07, 1955   Cancelled Treatment:    Reason Eval/Treat Not Completed: Patient declined, no reason specified. Pt declines PT, reports her arms, legs, mouth, head are all working well. PT explains role of exercise in potentially reducing the risk for orthostatic and syncopal events. PT will attempt to follow up tomorrow morning.   Zenaida Niece 02/22/2022, 5:33 PM

## 2022-02-22 NOTE — Progress Notes (Signed)
Mobility Specialist Progress Note:   02/22/22 1400  Mobility  Activity Transferred from bed to chair  Level of Assistance Contact guard assist, steadying assist  Distance Ambulated (ft) 3 ft  Activity Response Tolerated well  Mobility Referral Yes  $Mobility charge 1 Mobility    Pt was agreeable to transfer to chair. Pt c/o of dizziness; BP 77/60 post transfer. BP recovered after ~5 MIN stitting. Left pt in chair with alarm on and all needs mets  Nelta Numbers Mobility Specialist Please contact via [place] or  Rehab office at [phone]

## 2022-02-22 NOTE — Progress Notes (Signed)
PROGRESS NOTE    [name]  FIE:[id number] DOB: [birth year] DOA: 02/12/2022 PCP: Lujean Amel, MD   Brief Narrative:   This 66 yrs old female with PMH significant of anxiety, bipolar disorder, seizure disorder, depression, hyperlipidemia, dural venous sinus thrombosis, history of pulmonary embolism in August 2023 on Lovenox,  recurrent syncope and sinus pauses status post pacemaker, lumbar radiculopathy presented to the emergency department with syncope.  She was watching TV and when she was trying to go to the bathroom, She felt dizzy and blacked out.  Family had requested nursing home placement due to confusion and aggressive behavior.  In the ED, patient was noted to be orthostatic. Laboratory data was unremarkable. Patient was not taking her Depakote as prescribed and also Lovenox for 3 days. CT head and C-spine negative for acute finding.  Patient does have history of recurrent falls recently.  Patient was then considered for admission to the hospital for further evaluation and treatment.   Assessment & Plan:   Principal Problem:   Orthostatic hypotension Active Problems:   History of pulmonary embolism   Cerebral venous thrombosis of sigmoid sinus   Syncope   Bipolar disorder (HCC)   Lumbar radiculopathy   Depression with anxiety   Lower abdominal pain  Syncope likely sec. to severe orthostatic hypotension: Continue midodrine, compression stockings, fall precautions. History of sinus pauses status post pacemaker.   No aortic stenosis on previous echo done in February 2023.  TSH of 1.2.   Physical therapy has recommended skilled nursing facility on discharge.   Coreg discontinued due to orthostatic hypotension.   Continue midodrine.   History of seizure disorder: Continue Depakote.   Probable UTI: Initiated on IV Rocephin. She presented with Foley catheter on arrival.  Due to bladder dysfunction.   Urine culture with staph epi.  Likely colonization.  IV Rocephin  discontinued..   Will need to follow-up with urology as outpatient.   Urinary retention status post indwelling Foley catheter on arrival: Patient is supposed to follow-up with urology as outpatient to address this.   Continue catheter while in the hospital.   Anxiety / Bipolar disorder / Depression: Continue Depakote, amitriptyline, risperidone.   Patient was on involuntary commitment and one-to-one sitter which has been discontinued by psychiatry.   Has remained stable at this time. Patient is at her baseline at this time.   History of dural venous sinus thrombosis /PE: Continue therapeutic Lovenox.   Chronic lumbar radiculopathy: Continue gabapentin   DVT prophylaxis: Lovenox Code Status: Full code Family Communication: No family at bed side Disposition Plan:   Status is: Inpatient Remains inpatient appropriate because: Medically stable for disposition.  Insurance authorization pending   Consultants:  Psychiatry  Procedures: None. Antimicrobials: None.  Subjective: Patient was seen and examined at bedside.  Overnight events noted. Patient denies any specific symptoms, states she is doing much better and wants to be discharged. Awaiting insurance authorization for SNF placement.  Objective: Vitals:   02/21/22 1610 02/21/22 2039 02/22/22 0537 02/22/22 0800  BP: 115/82 1'03/75 96/76 95/75 '$  Pulse: 88 95 93 90  Resp: '16 18 17 18  '$ Temp: (!) 97.5 F (36.4 C) 98.3 F (36.8 C) (!) 97.5 F (36.4 C) 98.4 F (36.9 C)  TempSrc: Oral Oral Oral Oral  SpO2: 99% 96% 98% 96%  Weight:  46 kg    Height:        Intake/Output Summary (Last 24 hours) at 02/22/2022 1228 Last data filed at 02/22/2022 0800 Gross per 24 hour  Intake 780 ml  Output 550 ml  Net 230 ml   Filed Weights   02/16/22 2106 02/20/22 0917 02/21/22 2039  Weight: 46.4 kg 44.2 kg 46 kg    Examination: General exam: Appears comfortable, not in any acute distress.  Deconditioned. Respiratory system: CTA  bilaterally, respiratory effort normal, RR 15 Cardiovascular system: S1-S2 heard, regular rate and rhythm, no murmur. Gastrointestinal system: Abdomen is soft, non tender, non distended, BS+ Central nervous system: Alert and oriented X 3. No focal neurological deficits. Extremities: No edema, no cyanosis, no clubbing Skin: No rashes, lesions or ulcers Psychiatry: Judgement and insight appear normal. Mood & affect appropriate.     Data Reviewed: I have personally reviewed following labs and imaging studies  CBC: Recent Labs  Lab 02/16/22 0722 02/17/22 0338 02/21/22 0451  WBC 5.5 6.0 5.8  NEUTROABS 2.0 2.2  --   HGB 12.5 12.7 13.9  HCT 37.3 37.4 41.2  MCV 88.4 88.6 88.4  PLT 229 239 578   Basic Metabolic Panel: Recent Labs  Lab 02/16/22 0722 02/17/22 0338 02/21/22 0451  NA 141 140 135  K 4.0 4.1 4.8  CL 109 104 101  CO2 '25 27 28  '$ GLUCOSE 81 79 81  BUN '9 10 17  '$ CREATININE 0.76 0.80 0.78  CALCIUM 8.7* 9.2 9.1  MG 2.0 2.1 2.0   GFR: Estimated Creatinine Clearance: 51.6 mL/min (by C-G formula based on SCr of 0.78 mg/dL). Liver Function Tests: Recent Labs  Lab 02/16/22 0722 02/17/22 0338  AST 11* 12*  ALT 8 9  ALKPHOS 54 54  BILITOT 0.7 0.7  PROT 5.4* 5.5*  ALBUMIN 3.1* 3.2*   No results for input(s): "LIPASE", "AMYLASE" in the last 168 hours. No results for input(s): "AMMONIA" in the last 168 hours. Coagulation Profile: No results for input(s): "INR", "PROTIME" in the last 168 hours. Cardiac Enzymes: No results for input(s): "CKTOTAL", "CKMB", "CKMBINDEX", "TROPONINI" in the last 168 hours. BNP (last 3 results) No results for input(s): "PROBNP" in the last 8760 hours. HbA1C: No results for input(s): "HGBA1C" in the last 72 hours. CBG: No results for input(s): "GLUCAP" in the last 168 hours. Lipid Profile: No results for input(s): "CHOL", "HDL", "LDLCALC", "TRIG", "CHOLHDL", "LDLDIRECT" in the last 72 hours. Thyroid Function Tests: No results for  input(s): "TSH", "T4TOTAL", "FREET4", "T3FREE", "THYROIDAB" in the last 72 hours. Anemia Panel: No results for input(s): "VITAMINB12", "FOLATE", "FERRITIN", "TIBC", "IRON", "RETICCTPCT" in the last 72 hours. Sepsis Labs: No results for input(s): "PROCALCITON", "LATICACIDVEN" in the last 168 hours.  Recent Results (from the past 240 hour(s))  Culture, Urine (Do not remove urinary catheter, catheter placed by urology or difficult to place)     Status: Abnormal   Collection Time: 02/14/22 12:09 PM   Specimen: Urine, Catheterized  Result Value Ref Range Status   Specimen Description URINE, CATHETERIZED  Final   Special Requests   Final    NONE Performed at [facility], [address] [place], [place] [address]    Culture >=100,000 COLONIES/mL STAPHYLOCOCCUS EPIDERMIDIS (A)  Final   Report Status 02/17/2022 FINAL  Final   Organism ID, Bacteria STAPHYLOCOCCUS EPIDERMIDIS (A)  Final      Susceptibility   Staphylococcus epidermidis - MIC*    CIPROFLOXACIN >=8 RESISTANT Resistant     GENTAMICIN <=0.5 SENSITIVE Sensitive     NITROFURANTOIN <=16 SENSITIVE Sensitive     OXACILLIN >=4 RESISTANT Resistant     TETRACYCLINE >=16 RESISTANT Resistant     VANCOMYCIN 1  SENSITIVE Sensitive     TRIMETH/SULFA 80 RESISTANT Resistant     CLINDAMYCIN >=8 RESISTANT Resistant     RIFAMPIN <=0.5 SENSITIVE Sensitive     Inducible Clindamycin NEGATIVE Sensitive     * >=100,000 COLONIES/mL STAPHYLOCOCCUS EPIDERMIDIS    Radiology Studies: No results found.  Scheduled Meds:  amitriptyline  50 mg Oral QHS   Chlorhexidine Gluconate Cloth  6 each Topical Daily   divalproex  250 mg Oral Q8H   enoxaparin  50 mg Subcutaneous BID   gabapentin  100 mg Oral BID   midodrine  2.5 mg Oral TID WC   mouth rinse  15 mL Mouth Rinse 4 times per day   risperiDONE  3 mg Oral QHS   Continuous Infusions:   LOS: 8 days    Time spent: 35 mins    Jacquel Redditt, MD [facility]   If 7PM-7AM, please  contact night-coverage

## 2022-02-22 NOTE — TOC Initial Note (Signed)
Transition of Care [facility]) - Initial/Assessment Note    Patient Details  Name: [name] MRN: [id number] Date of Birth: [birth year]  [facility]) CM/SW Contact:    Milinda Antis, [place] Phone Number: 02/22/2022, 9:02 AM  Clinical Narrative:                 LCSW contacted [place] Medicare to inquire about insurance authorization and was informed that the case remains pending and has been sent to the medical director.    TOC will continue to follow.    Expected Discharge Plan: Skilled Nursing Facility Barriers to Discharge: Continued Medical Work up, SNF Pending bed offer   Patient Goals and CMS Choice Patient states their goals for this hospitalization and ongoing recovery are:: get back to what I was CMS Medicare.gov Compare Post Acute Care list provided to:: Patient Choice offered to / list presented to : Patient, Adult Children  Expected Discharge Plan and Services Expected Discharge Plan: [place] In-house Referral: Clinical Social Work   Post Acute Care Choice: [place] Living arrangements for the past 2 months: [place]                                      Prior Living Arrangements/Services Living arrangements for the past 2 months: Single Family Home Lives with:: Adult Children (lives with Investment banker, operational) Patient language and need for interpreter reviewed:: Yes Do you feel safe going back to the place where you live?: Yes      Need for Family Participation in Patient Care: Yes (Comment) Care giver support system in place?: Yes (comment) Current home services: Other (comment) (none) Criminal Activity/Legal Involvement Pertinent to Current Situation/Hospitalization: No - Comment as needed  Activities of Daily Living Home Assistive Devices/Equipment: None ADL Screening (condition at time of admission) Patient's cognitive ability adequate to safely complete daily activities?: No Is the patient deaf  or have difficulty hearing?: No Does the patient have difficulty seeing, even when wearing glasses/contacts?: No Does the patient have difficulty concentrating, remembering, or making decisions?: Yes Patient able to express need for assistance with ADLs?: Yes Does the patient have difficulty dressing or bathing?: Yes Independently performs ADLs?: No Communication: Independent Dressing (OT): Needs assistance Is this a change from baseline?: Pre-admission baseline Grooming: Needs assistance Is this a change from baseline?: Pre-admission baseline Feeding: Needs assistance Is this a change from baseline?: Pre-admission baseline Bathing: Dependent Is this a change from baseline?: Pre-admission baseline Toileting: Dependent Is this a change from baseline?: Pre-admission baseline In/Out Bed: Dependent Is this a change from baseline?: Pre-admission baseline Walks in Home: Dependent Is this a change from baseline?: Pre-admission baseline Does the patient have difficulty walking or climbing stairs?: Yes Weakness of Legs: Both Weakness of Arms/Hands: None  Permission Sought/Granted Permission sought to share information with : Family Supports Permission granted to share information with : Yes, Verbal Permission Granted  Share Information with NAME: daughters [name] and [name]           Emotional Assessment Appearance:: Appears stated age Attitude/Demeanor/Rapport: Engaged Affect (typically observed): Pleasant Orientation: : Oriented to Self, Oriented to Place   Psych Involvement: Yes (comment)  Admission diagnosis:  Orthostatic hypotension [I95.1] Contusion of occipital region of scalp, initial encounter [S00.03XA] Fall at home, initial encounter [W19.XXXA, Y92.009] Syncope [R55] Patient Active Problem List   Diagnosis Date Noted   Orthostatic hypotension 02/13/2022  Lower abdominal pain 02/13/2022   Acute encephalopathy 11/22/2021   Gross hematuria 11/11/2021   Aortic  atherosclerosis ([place]) 11/11/2021   Cholelithiasis 11/11/2021   Hyperlipidemia 11/11/2021   Supratherapeutic INR 11/07/2021   History of pulmonary embolism 11/07/2021   Cerebral venous thrombosis of sigmoid sinus 11/07/2021   Seizure-like activity ([place]) 11/07/2021   Acute deep vein thrombosis (DVT) of left peroneal vein ([place]) 10/27/2021   Acute pulmonary embolism without acute cor pulmonale (HCC) 10/27/2021   Jugular vein thrombosis, left 10/27/2021   Acute cystitis 10/26/2021   Severe protein-calorie malnutrition ([place]) 10/25/2021   Electrolyte abnormality 05/04/2021   Altered mental status 05/04/2021   fall with rhabdomyloysis  05/03/2021   Leukocytosis 05/03/2021   Chronic pain 05/03/2021   Underweight 05/03/2021   Lactic acidosis 10/62/6948   Acute metabolic encephalopathy 54/62/7035   Degeneration of lumbar intervertebral disc 08/16/2020   Sepsis secondary to UTI ([place]) 04/17/2020   Hypoglycemia without diagnosis of diabetes mellitus 04/17/2020   Sepsis due to pneumonia ([place]) 04/17/2020   Physical deconditioning    Myositis 03/03/2020   HCAP (healthcare-associated pneumonia) 02/21/2020   Generalized weakness 02/21/2020   GAD (generalized anxiety disorder) 02/21/2020   Proximal muscle weakness 02/08/2020   Dysphagia 02/08/2020   Gait disturbance 01/04/2020   Elevated CK 01/04/2020   Weakness of both lower extremities 01/04/2020   Encephalopathy    Polypharmacy    Fall 12/18/2019   Hypokalemia    Abnormal liver function test    Cervical post-laminectomy syndrome 10/21/2019   Cervical spondylosis 09/23/2019   Memory loss 07/02/2019   Depression with anxiety 07/02/2019   B12 deficiency 07/02/2019   Lumbar radiculopathy 02/10/2019   History of fusion of lumbar spine 02/10/2019   Dysesthesia 02/10/2019   Retroperitoneal fibrosis    Pelvic adhesions    Left ovarian cyst 11/17/2018   Elevated tumor markers 11/17/2018   Dyspnea 09/15/2018   Laryngopharyngeal reflux (LPR)  05/19/2018   Bipolar disorder ([place]) 04/16/2018   Insomnia 04/16/2018   Attention deficit hyperactivity disorder (ADHD) 04/16/2018   Pain in right knee 04/04/2017   Neck pain on right side 09/09/2014   Pseudoarthrosis of lumbar spine 04/15/2014   Family history of arrhythmogenic right ventricular cardiomyopathy 03/30/2013   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 02/20/2013   Sinus pause 02/14/2013   Syncope 10/29/2012   Hx of syncope- s/p Loop recorder 11/06/12 10/29/2012   Vertigo 10/29/2012   Tobacco abuse 10/29/2012   BENIGN POSITIONAL VERTIGO 11/14/2009   EAR PAIN, RIGHT 11/14/2009   SCIATICA 05/09/2009   PCP:  Lujean Amel, MD Pharmacy[facility]   [id number] [place] [address]: [phone]: [address]Phone: [phone]: [phone]    Social Determinants of Health (SDOH) Interventions    Readmission Risk Interventions    11/13/2021   10:54 AM  Readmission Risk Prevention Plan  Transportation Screening Complete  PCP or Specialist Appt within 3-5 Days Complete  HRI or [place]  Social Work Consult for [place]anning/Counseling Complete  Palliative Care Screening Not Applicable  Medication Review (Press photographer Complete

## 2022-02-22 NOTE — Progress Notes (Signed)
Mobility Specialist Progress Note:   02/22/22 1100  Orthostatic Lying   BP- Lying 106/79  Pulse- Lying 87  Orthostatic Sitting  BP- Sitting (!) 72/52  Pulse- Sitting 106  Mobility  Activity Dangled on edge of bed  Level of Assistance Standby assist, set-up cues, supervision of patient - no hands on  Distance Ambulated (ft)  (unable)  Activity Response Tolerated fair  Mobility Referral Yes  $Mobility charge 1 Mobility    Pt was agreeable to mobility. Positive orthostatic BP c/o feeling "tipsy" upon sitting EOB. Deferred further mobility. Left pt in bed with alarm on with all needs met.    Nelta Numbers Mobility Specialist Please contact via [place] or  Rehab office at [phone]

## 2022-02-23 DIAGNOSIS — Z9181 History of falling: Secondary | ICD-10-CM | POA: Diagnosis not present

## 2022-02-23 DIAGNOSIS — K219 Gastro-esophageal reflux disease without esophagitis: Secondary | ICD-10-CM | POA: Diagnosis not present

## 2022-02-23 DIAGNOSIS — M5416 Radiculopathy, lumbar region: Secondary | ICD-10-CM | POA: Diagnosis not present

## 2022-02-23 DIAGNOSIS — R339 Retention of urine, unspecified: Secondary | ICD-10-CM | POA: Diagnosis not present

## 2022-02-23 DIAGNOSIS — R252 Cramp and spasm: Secondary | ICD-10-CM | POA: Diagnosis not present

## 2022-02-23 DIAGNOSIS — M6281 Muscle weakness (generalized): Secondary | ICD-10-CM | POA: Diagnosis not present

## 2022-02-23 DIAGNOSIS — R296 Repeated falls: Secondary | ICD-10-CM | POA: Diagnosis not present

## 2022-02-23 DIAGNOSIS — R443 Hallucinations, unspecified: Secondary | ICD-10-CM | POA: Diagnosis not present

## 2022-02-23 DIAGNOSIS — R404 Transient alteration of awareness: Secondary | ICD-10-CM | POA: Diagnosis not present

## 2022-02-23 DIAGNOSIS — N390 Urinary tract infection, site not specified: Secondary | ICD-10-CM | POA: Diagnosis not present

## 2022-02-23 DIAGNOSIS — Z79899 Other long term (current) drug therapy: Secondary | ICD-10-CM | POA: Diagnosis not present

## 2022-02-23 DIAGNOSIS — M6259 Muscle wasting and atrophy, not elsewhere classified, multiple sites: Secondary | ICD-10-CM | POA: Diagnosis not present

## 2022-02-23 DIAGNOSIS — Z86711 Personal history of pulmonary embolism: Secondary | ICD-10-CM | POA: Diagnosis not present

## 2022-02-23 DIAGNOSIS — G08 Intracranial and intraspinal phlebitis and thrombophlebitis: Secondary | ICD-10-CM | POA: Diagnosis not present

## 2022-02-23 DIAGNOSIS — E559 Vitamin D deficiency, unspecified: Secondary | ICD-10-CM | POA: Diagnosis not present

## 2022-02-23 DIAGNOSIS — R569 Unspecified convulsions: Secondary | ICD-10-CM | POA: Diagnosis not present

## 2022-02-23 DIAGNOSIS — Z7401 Bed confinement status: Secondary | ICD-10-CM | POA: Diagnosis not present

## 2022-02-23 DIAGNOSIS — R262 Difficulty in walking, not elsewhere classified: Secondary | ICD-10-CM | POA: Diagnosis not present

## 2022-02-23 DIAGNOSIS — R2681 Unsteadiness on feet: Secondary | ICD-10-CM | POA: Diagnosis not present

## 2022-02-23 DIAGNOSIS — I951 Orthostatic hypotension: Secondary | ICD-10-CM | POA: Diagnosis not present

## 2022-02-23 DIAGNOSIS — R413 Other amnesia: Secondary | ICD-10-CM | POA: Diagnosis not present

## 2022-02-23 DIAGNOSIS — Z741 Need for assistance with personal care: Secondary | ICD-10-CM | POA: Diagnosis not present

## 2022-02-23 DIAGNOSIS — R55 Syncope and collapse: Secondary | ICD-10-CM | POA: Diagnosis not present

## 2022-02-23 DIAGNOSIS — R269 Unspecified abnormalities of gait and mobility: Secondary | ICD-10-CM | POA: Diagnosis not present

## 2022-02-23 DIAGNOSIS — Z743 Need for continuous supervision: Secondary | ICD-10-CM | POA: Diagnosis not present

## 2022-02-23 DIAGNOSIS — K5900 Constipation, unspecified: Secondary | ICD-10-CM | POA: Diagnosis not present

## 2022-02-23 MED ORDER — MELATONIN 5 MG PO TABS
10.0000 mg | ORAL_TABLET | Freq: Every evening | ORAL | Status: DC | PRN
  Administered 2022-02-23: 10 mg via ORAL
  Filled 2022-02-23: qty 2

## 2022-02-23 MED ORDER — MIDODRINE HCL 2.5 MG PO TABS: 2.5000 mg | ORAL_TABLET | Freq: Three times a day (TID) | ORAL | 0 refills | Status: AC

## 2022-02-23 NOTE — Progress Notes (Addendum)
Pt has DC order, DC to [facility]. Pt was informed and called report to [place], LPN. Pt will be DC will foley in. Pending transport.

## 2022-02-23 NOTE — Progress Notes (Signed)
Occupational Therapy Treatment Patient Details Name: [name] MRN: [id number] DOB: February 23, 1956 Today's Date: 02/23/2022   History of present illness [name] is a 66 y.o. female presented to the ED due to multiple recent falls.  Family requested nursing home placement due to patient having episodes of confusion and aggressive behavior.  In the ED, patient had severe orthostatic hypotension with blood pressure dropping from 153/97 supine to 86/62 standing.  No significant lab abnormalities except Depakote level subtherapeutic.  Family told RN that patient has not been taking her Depakote as prescribed.  In addition, she has not taken Lovenox for 3 days due to prescription running out.  CT head and C-spine negative for acute finding.   OT comments  Pt. Seen for skilled OT treatment session.  Agreeable to participation.  Remains limited with c/o fatigue and feeling "dizzy" while seated.  Able to complete grooming and LB dressing task eob with S.  No physical assist required for bed mobility in/out.  Following instructions and one step commands consistently.  Will continue to progress ADLS next sessions as pt. Able.  Requesting back to bed secondary to fatigue vs. Transfer to chair.  Agree with current d/c recommendations.    BP during session:  Supine hob elevated: 98/79-86 P 92 Sit eob: 98/79-46 P 102 Sit eob after 5 min.: 79/64-70 P 113 Supine hob elevated: 419/37-90 P 98   Recommendations for follow up therapy are one component of a multi-disciplinary discharge planning process, led by the attending physician.  Recommendations may be updated based on patient status, additional functional criteria and insurance authorization.    Follow Up Recommendations  Skilled nursing-short term rehab (<3 hours/day)     Assistance Recommended at Discharge Intermittent Supervision/Assistance  Patient can return home with the following  Assist for transportation;Direct supervision/assist for  medications management;Assistance with cooking/housework;A little help with walking and/or transfers   Equipment Recommendations  None recommended by OT    Recommendations for Other Services      Precautions / Restrictions Precautions Precautions: Fall Precaution Comments: Orthostatic hypotension       Mobility Bed Mobility Overal bed mobility: Needs Assistance Bed Mobility: Supine to Sit     Supine to sit: Supervision     General bed mobility comments: no physical assist in/out of bed, followed directions/cues for repositioning    Transfers                   General transfer comment: seated eob able to scoot towards hob x3 for back to bed     Balance                                           ADL either performed or assessed with clinical judgement   ADL Overall ADL's : Needs assistance/impaired     Grooming: Wash/dry face;Set up;Bed level               Lower Body Dressing: Set up;Sitting/lateral leans Lower Body Dressing Details (indicate cue type and reason): able to don sock while seated eob               General ADL Comments: session bed level/eob secondary to BP fluctuations and pt. stating she was tired and wanting to return to bed after seated adl tasks    Extremity/Trunk Assessment  Vision       Perception     Praxis      Cognition Arousal/Alertness: Awake/alert Behavior During Therapy: Flat affect Overall Cognitive Status: Within Functional Limits for tasks assessed                         Following Commands: Follows one step commands consistently                Exercises      Shoulder Instructions       General Comments  States she is from [place] originally, likes [place] but misses [place] also    Pertinent Vitals/ Pain       Pain Assessment Pain Assessment: No/denies pain  Home Living                                          Prior  Functioning/Environment              Frequency  Min 2X/week        Progress Toward Goals  OT Goals(current goals can now be found in the care plan section)  Progress towards OT goals: Progressing toward goals     Plan Discharge plan remains appropriate    Co-evaluation                 AM-PAC OT "6 Clicks" Daily Activity     Outcome Measure   Help from another person eating meals?: None Help from another person taking care of personal grooming?: None Help from another person toileting, which includes using toliet, bedpan, or urinal?: A Little Help from another person bathing (including washing, rinsing, drying)?: A Little Help from another person to put on and taking off regular upper body clothing?: None Help from another person to put on and taking off regular lower body clothing?: A Little 6 Click Score: 21    End of Session    OT Visit Diagnosis: Unsteadiness on feet (R26.81)   Activity Tolerance Patient limited by fatigue   Patient Left in bed;with call bell/phone within reach;with bed alarm set   Nurse Communication          Time: [phone] OT Time Calculation (min): 17 min  Charges: OT General Charges $OT Visit: 1 Visit OT Treatments $Self Care/Home Management : 8-22 mins  Sonia Baller, COTA/L Acute Rehabilitation [phone]   Clearnce Sorrel Lorraine-COTA/L 02/23/2022, 8:47 AM

## 2022-02-23 NOTE — Progress Notes (Signed)
Mobility Specialist Progress Note:   02/23/22 1245  Mobility  Activity  (chair level ex)  Level of Assistance Independent  Assistive Device None  Range of Motion/Exercises Active;Right leg;Left leg  Activity Response Tolerated well  Mobility Referral Yes  $Mobility charge 1 Mobility   Pt eager for mobility session, however declining transfers right now d/t potential dizziness. Performed multiple chair level exercises. Pt left in chair with all needs met, chair alarm on.  Nelta Numbers Mobility Specialist Please contact via SecureChat or  Rehab office at [phone]

## 2022-02-23 NOTE — Discharge Summary (Addendum)
Physician Discharge Summary  [name] ZOX:[id number] DOB: [birth year] DOA: 02/12/2022  PCP: Lujean Amel, MD  Admit date: 02/12/2022  Discharge date: 02/23/2022  Admitted From: Home.  Disposition:  SNF [name])  Recommendations for Outpatient Follow-up:  Follow up with PCP in 1-2 weeks. Please obtain BMP/CBC in one week. Patient is being discharged to SNF. 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  She was watching TV and when she was trying to go to the bathroom, She felt dizzy and blacked out.  Family had requested nursing home placement due to confusion and aggressive behavior.  In the ED, patient was noted to be orthostatic. Laboratory data was unremarkable. Patient was not taking her Depakote as prescribed and also Lovenox for 3 days. CT head and C-spine negative for acute finding.  Patient does have history of recurrent falls recently.  Patient was admitted for syncope possibly secondary to orthostatic hypotension.  Patient was started on midodrine, continued on compression stockings and fall precautions.  Echocardiogram shows no aortic stenosis.  Coreg was discontinued due to orthostatic hypotension.  PT and OT recommended skilled nursing facility for rehab.  Patient was continued on therapeutic Lovenox.  Started on ceftriaxone for probable UTI.  Urine cultures possible colonization.  Antibiotic discontinued.  Patient has been doing great and  wants to be discharged.  Patient is being discharged to skilled nursing facility for rehab.   Discharge Diagnoses:  Principal Problem:   Orthostatic hypotension Active Problems:   History of pulmonary embolism   Cerebral venous thrombosis of sigmoid sinus   Syncope   Bipolar disorder (HCC)   Lumbar radiculopathy   Depression with anxiety   Lower abdominal pain  Syncope likely sec. to severe orthostatic hypotension: Continue midodrine, compression stockings, fall precautions. History of sinus pauses status post pacemaker.   No aortic stenosis on previous echo done in February 2023.  TSH of 1.2.   Physical therapy has recommended skilled nursing facility on discharge.   Coreg discontinued due to orthostatic hypotension.   Continue midodrine.   History of seizure disorder: Continue Depakote.   Probable UTI: Initiated on IV Rocephin. She presented with Foley catheter on arrival.  Due to bladder dysfunction.   Urine culture with staph epi.  Likely colonization.  IV Rocephin discontinued..   Will need to follow-up with urology as outpatient.   Urinary retention status post indwelling Foley catheter on arrival: Patient is supposed to follow-up with urology as outpatient to address this.   Continue catheter while in the [facility]. Follow up Urology outpatient.   Anxiety / Bipolar disorder / Depression: Continue Depakote, amitriptyline, risperidone.   Patient was on involuntary commitment and one-to-one sitter which has been discontinued by psychiatry.   Has remained stable at this time. Patient is at her baseline at this time.   History of dural venous sinus thrombosis /PE: Continue therapeutic Lovenox.   Chronic lumbar radiculopathy: Continue gabapentin    Discharge Instructions  Discharge Instructions     Call MD for:  difficulty breathing, headache or visual disturbances   Complete by:  As directed    Call MD for:  persistant dizziness or light-headedness   Complete by:  As directed    Call MD for:  persistant nausea and vomiting   Complete by: As directed    Diet - low sodium heart healthy   Complete by: As directed    Diet Carb Modified   Complete by: As directed    Discharge instructions   Complete by: As directed    Advised to follow-up with primary care physician. Patient is being discharged to SNF. Advised to take midodrine 2.5 mg 3 times daily for orthostatic hypotension   Increase activity slowly   Complete by: As directed       Allergies as of 02/23/2022       Reactions   Penicillins Other (See Comments)   Yeast infection Did it involve swelling of the face/tongue/throat, SOB, or low BP? No Did it involve sudden or severe rash/hives, skin peeling, or any reaction on the inside of your mouth or nose? No Did you need to seek medical attention at a [facility] or doctor's office? Yes When did it last happen? More than 5 years ago  If all above answers are "NO", may proceed with cephalosporin use.        Medication List     TAKE these medications    amitriptyline 25 MG tablet Commonly known as: ELAVIL TAKE 2 TABLETS BY MOUTH AT BEDTIME.   b complex vitamins capsule Take 1 capsule by mouth daily.   Constulose 10 GM/15ML solution Generic drug: lactulose Take 15 mLs (10 g total) by mouth 2 (two) times daily as needed for mild constipation. Titrate for 2-3 BM daily. Hold if loose stool.   divalproex 250 MG DR tablet Commonly known as: DEPAKOTE Take 1 tablet (250 mg total) by mouth in the morning, at noon, and at bedtime.   enoxaparin 60 MG/0.6ML injection Commonly known as: LOVENOX Inject 0.5 mLs (50 mg total) into the skin 2 (two) times daily.   gabapentin 100 MG capsule Commonly known as: NEURONTIN Take 1 capsule (100 mg total) by mouth 2 (two) times daily. 1 po bid for 4 days, then 1 po tid. What changed:  when to take this additional instructions   hydrOXYzine 50 MG tablet Commonly known as: ATARAX Take 1 tablet (50  mg total) by mouth 3 (three) times daily as needed. What changed: reasons to take this   midodrine 2.5 MG tablet Commonly known as: PROAMATINE Take 1 tablet (2.5 mg total) by mouth 3 (three) times daily with meals.   nitrofurantoin (macrocrystal-monohydrate) 100 MG capsule Commonly known as: MACROBID Take 100 mg by mouth every 12 (twelve) hours.   omeprazole 40 MG capsule Commonly known as: PRILOSEC Take 40 mg by mouth daily as needed (acid reflux).   risperiDONE 3 MG tablet Commonly known as: RISPERDAL Take 1 tablet (3 mg total) by mouth at bedtime.   tiZANidine 4 MG tablet Commonly known as: ZANAFLEX Take 4 mg by mouth every 8 (eight) hours as needed for muscle spasms.        Contact information for follow-up providers     Koirala, Dibas, MD Follow up in 1 week(s).   Specialty: Family Medicine Contact information: [place] [address] [phone]              Contact information for after-discharge care     Destination     [facility] .   Service: Skilled Nursing  Contact information: [place] [phone]                    Allergies  Allergen Reactions   Penicillins Other (See Comments)    Yeast infection Did it involve swelling of the face/tongue/throat, SOB, or low BP? No Did it involve sudden or severe rash/hives, skin peeling, or any reaction on the inside of your mouth or nose? No Did you need to seek medical attention at a [facility] or doctor's office? Yes When did it last happen? More than 5 years ago  If all above answers are "NO", may proceed with cephalosporin use.     Consultations: Psychiatry  Procedures/Studies: ECHOCARDIOGRAM COMPLETE  Result Date: 02/13/2022    ECHOCARDIOGRAM REPORT   Patient Name:   IRISH BREISCH Date of Exam: 02/13/2022 Medical Rec #:  [id number]      Height:       67.0 in Accession #:     [id number]     Weight:       120.0 lb Date of Birth:  11-15-1955      BSA:          1.628 m Patient Age:    19 years       BP:           152/101 mmHg Patient Gender: F              HR:           104 bpm. Exam Location:  Inpatient Procedure: 2D Echo, Color Doppler and Cardiac Doppler Indications:    syncope  History:        Patient has prior history of Echocardiogram examinations, most                 recent 05/04/2021. Pacemaker, Signs/Symptoms:Syncope; Risk                 Factors:Dyslipidemia and Current Smoker.  Sonographer:    Melissa Morford RDCS (AE, PE) Referring Phys: [id number] Margaree Mackintosh Alicia  1. Left ventricular ejection fraction, by estimation, is 60 to 65%. The left ventricle has normal function. The left ventricle has no regional wall motion abnormalities. Left ventricular diastolic parameters are consistent with Grade I diastolic dysfunction (impaired relaxation).  2. Right ventricular systolic function is normal. The right ventricular size is normal. There is normal pulmonary artery systolic pressure. The estimated right ventricular systolic pressure is 27.7 mmHg.  3. The mitral valve is normal in structure. Trivial mitral valve regurgitation. No evidence of mitral stenosis.  4. The aortic valve is tricuspid. Aortic valve regurgitation is not visualized. No aortic stenosis is present.  5. The inferior vena cava is normal in size with greater than 50% respiratory variability, suggesting right atrial pressure of 3 mmHg. FINDINGS  Left Ventricle: Left ventricular ejection fraction, by estimation, is 60 to 65%. The left ventricle has normal function. The left ventricle has no regional wall motion abnormalities. The left ventricular internal cavity size was normal in size. There is  no left ventricular hypertrophy. Left ventricular diastolic parameters are consistent with Grade I diastolic dysfunction (impaired relaxation). Right Ventricle: The right ventricular size is normal. No increase in right  ventricular wall thickness. Right ventricular systolic function is normal. There is normal pulmonary artery systolic pressure. The tricuspid regurgitant velocity is 2.49 m/s, and  with an assumed right atrial pressure of 3 mmHg, the estimated right ventricular systolic pressure is 41.2 mmHg. Left Atrium: Left  atrial size was normal in size. Right Atrium: Right atrial size was normal in size. Pericardium: There is no evidence of pericardial effusion. Mitral Valve: The mitral valve is normal in structure. Trivial mitral valve regurgitation. No evidence of mitral valve stenosis. Tricuspid Valve: The tricuspid valve is normal in structure. Tricuspid valve regurgitation is mild . No evidence of tricuspid stenosis. Aortic Valve: The aortic valve is tricuspid. Aortic valve regurgitation is not visualized. No aortic stenosis is present. Pulmonic Valve: The pulmonic valve was normal in structure. Pulmonic valve regurgitation is trivial. No evidence of pulmonic stenosis. Aorta: The aortic root is normal in size and structure. Venous: The inferior vena cava is normal in size with greater than 50% respiratory variability, suggesting right atrial pressure of 3 mmHg. IAS/Shunts: No atrial level shunt detected by color flow Doppler. Additional Comments: A device lead is visualized in the right ventricle.  LEFT VENTRICLE PLAX 2D LVIDd:         3.50 cm     Diastology LVIDs:         2.60 cm     LV e' medial:    7.30 cm/s LV PW:         1.00 cm     LV E/e' medial:  7.7 LV IVS:        0.80 cm     LV e' lateral:   11.30 cm/s LVOT diam:     2.00 cm     LV E/e' lateral: 5.0 LV SV:         42 LV SV Index:   26 LVOT Area:     3.14 cm  LV Volumes (MOD) LV vol d, MOD A2C: 58.9 ml LV vol d, MOD A4C: 47.3 ml LV vol s, MOD A2C: 19.0 ml LV vol s, MOD A4C: 23.2 ml LV SV MOD A2C:     39.9 ml LV SV MOD A4C:     47.3 ml LV SV MOD BP:      33.1 ml RIGHT VENTRICLE RV S prime:     12.70 cm/s LEFT ATRIUM             Index        RIGHT ATRIUM            Index LA diam:        2.90 cm 1.78 cm/m   RA Area:     14.00 cm LA Vol (A2C):   33.2 ml 20.40 ml/m  RA Volume:   35.80 ml  22.00 ml/m LA Vol (A4C):   31.4 ml 19.29 ml/m LA Biplane Vol: 33.1 ml 20.34 ml/m  AORTIC VALVE LVOT Vmax:   99.10 cm/s LVOT Vmean:  59.300 cm/s LVOT VTI:    0.134 m  AORTA Ao Root diam: 2.90 cm Ao Asc diam:  2.90 cm MITRAL VALVE                TRICUSPID VALVE MV Area (PHT): 10.25 cm    TR Peak grad:   24.8 mmHg MV Decel Time: 74 msec      TR Vmax:        249.00 cm/s MV E velocity: 56.00 cm/s MV A velocity: 106.00 cm/s  SHUNTS MV E/A ratio:  0.53         Systemic VTI:  0.13 m                             Systemic Diam: 2.00 cm Mihai  Croitoru MD Electronically signed by Sanda Klein MD Signature Date/Time: 02/13/2022/11:32:05 AM    Final    CT Head Wo Contrast  Result Date: 02/12/2022 CLINICAL DATA:  Recent fall while on blood thinners, initial encounter EXAM: CT HEAD WITHOUT CONTRAST CT CERVICAL SPINE WITHOUT CONTRAST TECHNIQUE: Multidetector CT imaging of the head and cervical spine was performed following the standard protocol without intravenous contrast. Multiplanar CT image reconstructions of the cervical spine were also generated. RADIATION DOSE REDUCTION: This exam was performed according to the departmental dose-optimization program which includes automated exposure control, adjustment of the mA and/or kV according to patient size and/or use of iterative reconstruction technique. COMPARISON:  02/11/2022 FINDINGS: CT HEAD FINDINGS Brain: No evidence of acute infarction, hemorrhage, hydrocephalus, extra-axial collection or mass lesion/mass effect. Mild atrophic changes are noted. Vascular: No hyperdense vessel or unexpected calcification. Skull: Normal. Negative for fracture or focal lesion. Sinuses/Orbits: No acute finding. Other: None. CT CERVICAL SPINE FINDINGS Alignment: Within normal limits. Skull base and vertebrae: 7 cervical segments are well visualized. Vertebral body  height is well maintained. Changes of prior fusion at C4-5 and C5-6 are noted with anterior fixation. No acute fracture or acute facet abnormality is noted. Mild facet hypertrophic changes are seen. Mild osteophytic changes are noted most prominent at C6-7. Soft tissues and spinal canal: Surrounding soft tissue structures are within normal limits. Upper chest: Visualized lung apices are unremarkable. Other: None IMPRESSION: CT of the head: No acute intracranial abnormality noted. CT of the cervical spine: Mild degenerative and postoperative changes without acute abnormality. Electronically Signed   By: Inez Catalina M.D.   On: 02/12/2022 23:48   CT Cervical Spine Wo Contrast  Result Date: 02/12/2022 CLINICAL DATA:  Recent fall while on blood thinners, initial encounter EXAM: CT HEAD WITHOUT CONTRAST CT CERVICAL SPINE WITHOUT CONTRAST TECHNIQUE: Multidetector CT imaging of the head and cervical spine was performed following the standard protocol without intravenous contrast. Multiplanar CT image reconstructions of the cervical spine were also generated. RADIATION DOSE REDUCTION: This exam was performed according to the departmental dose-optimization program which includes automated exposure control, adjustment of the mA and/or kV according to patient size and/or use of iterative reconstruction technique. COMPARISON:  02/11/2022 FINDINGS: CT HEAD FINDINGS Brain: No evidence of acute infarction, hemorrhage, hydrocephalus, extra-axial collection or mass lesion/mass effect. Mild atrophic changes are noted. Vascular: No hyperdense vessel or unexpected calcification. Skull: Normal. Negative for fracture or focal lesion. Sinuses/Orbits: No acute finding. Other: None. CT CERVICAL SPINE FINDINGS Alignment: Within normal limits. Skull base and vertebrae: 7 cervical segments are well visualized. Vertebral body height is well maintained. Changes of prior fusion at C4-5 and C5-6 are noted with anterior fixation. No acute  fracture or acute facet abnormality is noted. Mild facet hypertrophic changes are seen. Mild osteophytic changes are noted most prominent at C6-7. Soft tissues and spinal canal: Surrounding soft tissue structures are within normal limits. Upper chest: Visualized lung apices are unremarkable. Other: None IMPRESSION: CT of the head: No acute intracranial abnormality noted. CT of the cervical spine: Mild degenerative and postoperative changes without acute abnormality. Electronically Signed   By: Inez Catalina M.D.   On: 02/12/2022 23:48   CT Head Wo Contrast  Result Date: 02/11/2022 CLINICAL DATA:  Head trauma, coagulopathy (Age 64-64y) EXAM: CT HEAD WITHOUT CONTRAST TECHNIQUE: Contiguous axial images were obtained from the base of the skull through the vertex without intravenous contrast. RADIATION DOSE REDUCTION: This exam was performed according to the departmental dose-optimization program which includes  automated exposure control, adjustment of the mA and/or kV according to patient size and/or use of iterative reconstruction technique. COMPARISON:  CT head 11/21/2021 FINDINGS: Brain: Cerebral ventricle sizes are concordant with the degree of cerebral volume loss. No evidence of large-territorial acute infarction. No parenchymal hemorrhage. No mass lesion. No extra-axial collection. No mass effect or midline shift. No hydrocephalus. Basilar cisterns are patent. Vascular: No hyperdense vessel. Skull: No acute fracture or focal lesion. Sinuses/Orbits: Paranasal sinuses and mastoid air cells are clear. Bilateral lens replacement. Otherwise the orbits are unremarkable. Other: None. IMPRESSION: No acute intracranial abnormality. Electronically Signed   By: Iven Finn M.D.   On: 02/11/2022 17:19   CUP PACEART REMOTE DEVICE CHECK  Result Date: 02/06/2022 Scheduled remote reviewed. Normal device function.  Last remote 2/16, brief VHR's, 8-11 beats NSVT and SVT AHR's, longest duraiton 63mn 48sec, SVT, one  with possible noise 10 rate drop episodes Next remote 91 days. LA  CT Abdomen Pelvis W Contrast  Result Date: 02/04/2022 CLINICAL DATA:  Right lower quadrant abdominal pain EXAM: CT ABDOMEN AND PELVIS WITH CONTRAST TECHNIQUE: Multidetector CT imaging of the abdomen and pelvis was performed using the standard protocol following bolus administration of intravenous contrast. RADIATION DOSE REDUCTION: This exam was performed according to the departmental dose-optimization program which includes automated exposure control, adjustment of the mA and/or kV according to patient size and/or use of iterative reconstruction technique. CONTRAST:  1018mOMNIPAQUE IOHEXOL 300 MG/ML  SOLN COMPARISON:  Prior CT scan of the abdomen and pelvis 11/11/2021 FINDINGS: Lower chest: No acute abnormality. Hepatobiliary: No focal liver abnormality is seen. No gallstones, gallbladder wall thickening, or biliary dilatation. Pancreas: Unremarkable. No pancreatic ductal dilatation or surrounding inflammatory changes. Spleen: Normal in size without focal abnormality. Adrenals/Urinary Tract: Adrenal glands are unremarkable. Kidneys are normal, without renal calculi, focal lesion, or hydronephrosis. A Foley catheter is present within the bladder. The bladder wall appears hazy, thickened and hyperemic consistent with cystitis. Stomach/Bowel: Stomach is within normal limits. Appendix appears normal. No evidence of bowel wall thickening, distention, or inflammatory changes. Vascular/Lymphatic: Atherosclerotic calcifications along the abdominal aorta. No evidence of aneurysm or dissection. No suspicious lymphadenopathy. Reproductive: Status post hysterectomy. No adnexal masses. Other: No abdominal wall hernia or abnormality. No abdominopelvic ascites. Musculoskeletal: Surgical changes of prior L4-L5 posterior lumbar interbody fusion with interbody graft in place. No evidence of hardware complication. IMPRESSION: 1. Thickened irregular bladder wall  with evidence of hyperemia. Findings suggest cystitis. Differential includes both infectious and inflammatory etiologies. 2. Otherwise, no acute abnormality within the abdomen or pelvis. 3.  Aortic Atherosclerosis (ICD10-I70.0). Electronically Signed   By: HeJacqulynn Cadet.D.   On: 02/04/2022 07:22   CT HEAD WO CONTRAST  Result Date: 02/04/2022 CLINICAL DATA:  Mental status changes, near syncope EXAM: CT HEAD WITHOUT CONTRAST TECHNIQUE: Contiguous axial images were obtained from the base of the skull through the vertex without intravenous contrast. RADIATION DOSE REDUCTION: This exam was performed according to the departmental dose-optimization program which includes automated exposure control, adjustment of the mA and/or kV according to patient size and/or use of iterative reconstruction technique. COMPARISON:  Prior CT scan of the head 05/03/2021 FINDINGS: Brain: No evidence of acute infarction, hemorrhage, hydrocephalus, extra-axial collection or mass lesion/mass effect. Vascular: No hyperdense vessel or unexpected calcification. Skull: Normal. Negative for fracture or focal lesion. Sinuses/Orbits: No acute finding. Other: None. IMPRESSION: No acute intracranial abnormality. Electronically Signed   By: HeJacqulynn Cadet.D.   On: 02/04/2022 07:18   DG Chest  Port 1 View  Result Date: 02/04/2022 CLINICAL DATA:  66 year old female with history of altered mental status. Syncope. EXAM: PORTABLE CHEST 1 VIEW COMPARISON:  Chest x-ray 01/18/2022. FINDINGS: Lung volumes are normal. No consolidative airspace disease. No pleural effusions. No pneumothorax. No pulmonary nodule or mass noted. Pulmonary vasculature and the cardiomediastinal silhouette are within normal limits. Atherosclerosis in the thoracic aorta. 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 IMPRESSION: 1.  No radiographic evidence of acute cardiopulmonary  disease. 2. Aortic atherosclerosis. Electronically Signed   By: Vinnie Langton M.D.   On: 02/04/2022 05:23   CT Soft Tissue Neck W Contrast  Result Date: 01/27/2022 CLINICAL DATA:  Epiglottitis or tonsillitis suspected EXAM: CT NECK WITH CONTRAST TECHNIQUE: Multidetector CT imaging of the neck was performed using the standard protocol following the bolus administration of intravenous contrast. RADIATION DOSE REDUCTION: This exam was performed according to the departmental dose-optimization program which includes automated exposure control, adjustment of the mA and/or kV according to patient size and/or use of iterative reconstruction technique. CONTRAST:  71m OMNIPAQUE IOHEXOL 300 MG/ML  SOLN COMPARISON:  CTA neck 12/15/2019. FINDINGS: Pharynx and larynx: Normal. No mass or swelling. Unremarkable epiglottis. Salivary glands: No inflammation, mass, or stone. Thyroid: Normal. Lymph nodes: None enlarged or abnormal density. Vascular: Negative. Limited intracranial: Negative. Visualized orbits: Negative. Mastoids and visualized paranasal sinuses: Clear. Skeleton: Solid C4-C7 ACDF. Upper chest: Visualized lung apices are clear. IMPRESSION: Unremarkable CT of the neck.  No acute findings. Electronically Signed   By: FMargaretha SheffieldM.D.   On: 01/27/2022 19:59   (Echo, Carotid, EGD, Colonoscopy, ERCP)    Subjective: Patient was seen and examined at bedside.  Overnight events noted.   Patient reports doing much better.  She denies any specific symptoms.   She wants to be discharged.  [facility]  [facility]charge Exam: Vitals:   02/23/22 0517 02/23/22 0938  BP: 113/83 103/81  Pulse: 81 95  Resp: 18 17  Temp: 98 F (36.7 C)   SpO2: 100% 100%   Vitals:   02/22/22 1714 02/22/22 2040 02/23/22 0517 02/23/22 0938  BP: 103/76 104/74 113/83 103/81  Pulse: 94 91 81 95  Resp: '19 18 18 17  '$ Temp: 98.7 F (37.1 C) (!) 97.4 F (36.3 C) 98 F (36.7 C)   TempSrc: Oral Oral     SpO2: 98% 98% 100% 100%  Weight:      Height:        General: Pt is alert, awake, not in acute distress Cardiovascular: RRR, S1/S2 +, no rubs, no gallops Respiratory: CTA bilaterally, no wheezing, no rhonchi Abdominal: Soft, NT, ND, bowel sounds + Extremities: no edema, no cyanosis    The results of significant diagnostics from this hospitalization (including imaging, microbiology, ancillary and laboratory) are listed below for reference.     Microbiology: Recent Results (from the past 240 hour(s))  Culture, Urine (Do not remove urinary catheter, catheter placed by urology or difficult to place)     Status: Abnormal   Collection Time: 02/14/22 12:09 PM   Specimen: Urine, Catheterized  Result Value Ref Range Status   Specimen Description URINE, CATHETERIZED  Final   Special Requests   Final    NONE Performed at [facility] [address] [place] [address]   Culture >=100,000 COLONIES/mL STAPHYLOCOCCUS EPIDERMIDIS (A)  Final   Report Status 02/17/2022 FINAL  Final  Organism ID, Bacteria STAPHYLOCOCCUS EPIDERMIDIS (A)  Final      Susceptibility   Staphylococcus epidermidis - MIC*    CIPROFLOXACIN >=8 RESISTANT Resistant     GENTAMICIN <=0.5 SENSITIVE Sensitive     NITROFURANTOIN <=16 SENSITIVE Sensitive     OXACILLIN >=4 RESISTANT Resistant     TETRACYCLINE >=16 RESISTANT Resistant     VANCOMYCIN 1 SENSITIVE Sensitive     TRIMETH/SULFA 80 RESISTANT Resistant     CLINDAMYCIN >=8 RESISTANT Resistant     RIFAMPIN <=0.5 SENSITIVE Sensitive     Inducible Clindamycin NEGATIVE Sensitive     * >=100,000 COLONIES/mL STAPHYLOCOCCUS EPIDERMIDIS     Labs: BNP (last 3 results) Recent Labs    02/15/22 0627 02/16/22 0722 02/17/22 0338  BNP 19.8 38.9 82.9   Basic Metabolic Panel: Recent Labs  Lab 02/17/22 0338 02/21/22 0451  NA 140 135  K 4.1 4.8  CL 104 101  CO2 27 28  GLUCOSE 79 81  BUN 10 17  CREATININE 0.80 0.78  CALCIUM 9.2 9.1  MG 2.1 2.0    Liver Function Tests: Recent Labs  Lab 02/17/22 0338  AST 12*  ALT 9  ALKPHOS 54  BILITOT 0.7  PROT 5.5*  ALBUMIN 3.2*   No results for input(s): "LIPASE", "AMYLASE" in the last 168 hours. No results for input(s): "AMMONIA" in the last 168 hours. CBC: Recent Labs  Lab 02/17/22 0338 02/21/22 0451  WBC 6.0 5.8  NEUTROABS 2.2  --   HGB 12.7 13.9  HCT 37.4 41.2  MCV 88.6 88.4  PLT 239 220   Cardiac Enzymes: No results for input(s): "CKTOTAL", "CKMB", "CKMBINDEX", "TROPONINI" in the last 168 hours. BNP: Invalid input(s): "POCBNP" CBG: No results for input(s): "GLUCAP" in the last 168 hours. D-Dimer No results for input(s): "DDIMER" in the last 72 hours. Hgb A1c No results for input(s): "HGBA1C" in the last 72 hours. Lipid Profile No results for input(s): "CHOL", "HDL", "LDLCALC", "TRIG", "CHOLHDL", "LDLDIRECT" in the last 72 hours. Thyroid function studies No results for input(s): "TSH", "T4TOTAL", "T3FREE", "THYROIDAB" in the last 72 hours.  Invalid input(s): "FREET3" Anemia work up No results for input(s): "VITAMINB12", "FOLATE", "FERRITIN", "TIBC", "IRON", "RETICCTPCT" in the last 72 hours. Urinalysis    Component Value Date/Time   COLORURINE YELLOW 02/13/2022 1740   APPEARANCEUR CLOUDY (A) 02/13/2022 1740   LABSPEC 1.010 02/13/2022 1740   PHURINE 8.0 02/13/2022 1740   GLUCOSEU NEGATIVE 02/13/2022 1740   HGBUR SMALL (A) 02/13/2022 1740   BILIRUBINUR NEGATIVE 02/13/2022 1740   KETONESUR 5 (A) 02/13/2022 1740   PROTEINUR NEGATIVE 02/13/2022 1740   NITRITE NEGATIVE 02/13/2022 1740   LEUKOCYTESUR TRACE (A) 02/13/2022 1740   Sepsis Labs Recent Labs  Lab 02/17/22 0338 02/21/22 0451  WBC 6.0 5.8   Microbiology Recent Results (from the past 240 hour(s))  Culture, Urine (Do not remove urinary catheter, catheter placed by urology or difficult to place)     Status: Abnormal   Collection Time: 02/14/22 12:09 PM   Specimen: Urine, Catheterized  Result Value  Ref Range Status   Specimen Description URINE, CATHETERIZED  Final   Special Requests   Final    NONE Performed at [facility], [place] [address], [place], [place] [address]    Culture >=100,000 COLONIES/mL STAPHYLOCOCCUS EPIDERMIDIS (A)  Final   Report Status 02/17/2022 FINAL  Final   Organism ID, Bacteria STAPHYLOCOCCUS EPIDERMIDIS (A)  Final      Susceptibility   Staphylococcus epidermidis - MIC*  CIPROFLOXACIN >=8 RESISTANT Resistant     GENTAMICIN <=0.5 SENSITIVE Sensitive     NITROFURANTOIN <=16 SENSITIVE Sensitive     OXACILLIN >=4 RESISTANT Resistant     TETRACYCLINE >=16 RESISTANT Resistant     VANCOMYCIN 1 SENSITIVE Sensitive     TRIMETH/SULFA 80 RESISTANT Resistant     CLINDAMYCIN >=8 RESISTANT Resistant     RIFAMPIN <=0.5 SENSITIVE Sensitive     Inducible Clindamycin NEGATIVE Sensitive     * >=100,000 COLONIES/mL STAPHYLOCOCCUS EPIDERMIDIS     Time coordinating discharge: Over 30 minutes  SIGNED:   Shawna Clamp, MD  [facility] 02/23/2022, 11:42 AM Pager   If 7PM-7AM, please contact night-coverage

## 2022-02-23 NOTE — TOC Transition Note (Signed)
Transition of Care [facility]) - CM/SW Discharge Note   Patient Details  Name: [name] MRN: [id number] Date of Birth: 23-Jul-1955  Transition of Care [facility]) CM/SW Contact:  Milinda Antis, [place] Phone Number: 02/23/2022, 11:56 AM   Clinical Narrative:    Patient will DC to:  [facility] Anticipated DC date: 02/23/2022 Family notified: Yes Transport by: Corey Harold   Per MD patient ready for DC to SNF. RN to call report prior to discharge [phone] room [phone]). RN, patient, patient's family, and facility notified of DC. Discharge Summary sent to facility. DC packet on chart. Ambulance transport requested for patient.   CSW will sign off for now as social work intervention is no longer needed. Please consult [place] again if new needs arise.    Final next level of care: Skilled Nursing Facility Barriers to Discharge: Barriers Resolved   Patient Goals and CMS Choice Patient states their goals for this hospitalization and ongoing recovery are:: get back to what I was CMS Medicare.gov Compare Post Acute Care list provided to:: Patient Choice offered to / list presented to : Patient, Adult Children  Discharge Placement              Patient chooses bed at: [facility] Patient to be transferred to facility by: [place] Name of family member notified: Mirinda, [place] (Daughter) [phone] Patient and family notified of of transfer: 02/23/22  Discharge Plan and Services In-house Referral: Clinical Social Work   Post Acute Care Choice: [facility]                               Social Determinants of Health (SDOH) Interventions     Readmission Risk Interventions    11/13/2021   10:54 AM  Readmission Risk Prevention Plan  Transportation Screening Complete  PCP or Specialist Appt within 3-5 Days Complete  HRI or [place] Complete  Social Work Consult for [place] Planning/Counseling Complete  Palliative Care Screening Not Applicable   Medication Review Press photographer) Complete

## 2022-02-23 NOTE — Discharge Instructions (Signed)
Advised to follow-up with primary care physician. Patient is being discharged to SNF. Advised to take midodrine 2.5 mg 3 times daily for orthostatic hypotension

## 2022-02-23 NOTE — Progress Notes (Signed)
Physical Therapy Treatment Patient Details Name: [name] MRN: [id number] DOB: [birth year] Today's Date: 02/23/2022   History of Present Illness [name] is a 66 y.o. female presented to the ED due to multiple recent falls.  Family requested nursing home placement due to patient having episodes of confusion and aggressive behavior.  In the ED, patient had severe orthostatic hypotension with blood pressure dropping from 153/97 supine to 86/62 standing.  No significant lab abnormalities except Depakote level subtherapeutic.  Family told RN that patient has not been taking her Depakote as prescribed.  In addition, she has not taken Lovenox for 3 days due to prescription running out.  CT head and C-spine negative for acute finding.    PT Comments    Continues to work towards functional mobility goals. Requires min assist for sit<>stand and step pivot transfer due to posterior instability. Still primarily limited by orthostatic hypotension (see details below.) Patient tolerating sitting up in chair but LEs do need to be elevated to maintain acceptable BP (compression stockings on.) BP drops significantly with LEs elevated while sitting. Tolerated LE exercises without issues. Encouraged increasing tolerance and duration OOB as long as she does not become symptomatic. Patient will continue to benefit from skilled physical therapy services to further improve independence with functional mobility.    Recommendations for follow up therapy are one component of a multi-disciplinary discharge planning process, led by the attending physician.  Recommendations may be updated based on patient status, additional functional criteria and insurance authorization.  Follow Up Recommendations  Skilled nursing-short term rehab (<3 hours/day) Can patient physically be transported by private vehicle: No (but perhaps soon when BPs with activity are stabilized)   Assistance Recommended at Discharge Frequent or  constant Supervision/Assistance  Patient can return home with the following Assistance with cooking/housework;Help with stairs or ramp for entrance;Assist for transportation;A lot of help with walking and/or transfers;A lot of help with bathing/dressing/bathroom;Direct supervision/assist for medications management;Direct supervision/assist for financial management   Equipment Recommendations  Rollator (4 wheels);BSC/3in1    Recommendations for Other Services       Precautions / Restrictions Precautions Precautions: Fall Precaution Comments: Orthostatic hypotension Restrictions Weight Bearing Restrictions: No     Mobility  Bed Mobility Overal bed mobility: Needs Assistance Bed Mobility: Supine to Sit, Sit to Supine     Supine to sit: Supervision     General bed mobility comments: Supervision for safety, close to EOB, assisted with taking blanket off. Effortful to rise however able to complete without assist. Mod I returning to supine, needs cues to readjust position in bed, unaware of being too low in bed. Practiced several times.    Transfers Overall transfer level: Needs assistance Equipment used: 1 person hand held assist Transfers: Sit to/from Stand, Bed to chair/wheelchair/BSC Sit to Stand: Min assist   Step pivot transfers: Min assist       General transfer comment: Min assist, hand held support, to rise from EOB showing posterior lean needing slight assist for balance correcting. Min assist for balance with small step pivot to recliner. Symptomatic with dizziness while standing. Improves upon sitting but only with LEs elevated in recliner.    Ambulation/Gait               General Gait Details: not appropriate 2/2 orthostatics   Stairs             Wheelchair Mobility    Modified Rankin (Stroke Patients Only)       Balance Overall balance  assessment: Needs assistance Sitting-balance support: Feet supported, No upper extremity  supported Sitting balance-Leahy Scale: Good Sitting balance - Comments: sits up in chair back unsupported.   Standing balance support: Single extremity supported Standing balance-Leahy Scale: Poor Standing balance comment: posterior lean at times                            Cognition Arousal/Alertness: Awake/alert Behavior During Therapy: Flat affect Overall Cognitive Status: Within Functional Limits for tasks assessed Area of Impairment: Orientation, Following commands, Safety/judgement, Problem solving                 Orientation Level: Disoriented to, Time, Situation Current Attention Level: Sustained   Following Commands: Follows one step commands consistently Safety/Judgement: Decreased awareness of safety, Decreased awareness of deficits   Problem Solving: Slow processing, Decreased initiation General Comments: Able to recognize dizziness.        Exercises General Exercises - Lower Extremity Ankle Circles/Pumps: AROM, Both, 15 reps, Seated Quad Sets: Strengthening, Both, Seated, 10 reps Gluteal Sets: Strengthening, Both, Seated, 10 reps Hip ABduction/ADduction: Strengthening, Both, Seated, 10 reps Straight Leg Raises: Strengthening, Both, 15 reps, Supine    General Comments General comments (skin integrity, edema, etc.): Orthostatics: supine 121/77 HR 90; Seated EOB 82/66 HR 105; (unable to tolerate standing long enough for BP check; Reclined in chair with LEs elevated BP 107/82 HR 95 asymptomatic in this position.      Pertinent Vitals/Pain Pain Assessment Pain Assessment: No/denies pain Pain Intervention(s): Monitored during session, Repositioned    Home Living                          Prior Function            PT Goals (current goals can now be found in the care plan section) Acute Rehab PT Goals Patient Stated Goal: Go home PT Goal Formulation: Patient unable to participate in goal setting Time For Goal Achievement:  02/28/22 Potential to Achieve Goals: Good Progress towards PT goals: Progressing toward goals    Frequency    Min 2X/week      PT Plan Current plan remains appropriate    Co-evaluation              AM-PAC PT "6 Clicks" Mobility   Outcome Measure  Help needed turning from your back to your side while in a flat bed without using bedrails?: None Help needed moving from lying on your back to sitting on the side of a flat bed without using bedrails?: A Little Help needed moving to and from a bed to a chair (including a wheelchair)?: A Little Help needed standing up from a chair using your arms (e.g., wheelchair or bedside chair)?: A Little Help needed to walk in hospital room?: Total Help needed climbing 3-5 steps with a railing? : Total 6 Click Score: 15    End of Session Equipment Utilized During Treatment: Gait belt Activity Tolerance: Treatment limited secondary to medical complications (Comment) (orthostatic hypotension, symptomatic) Patient left: with call bell/phone within reach;in chair;with chair alarm set Nurse Communication: Mobility status (BP) PT Visit Diagnosis: Unsteadiness on feet (R26.81);Other abnormalities of gait and mobility (R26.89)     Time: 7824-2353 PT Time Calculation (min) (ACUTE ONLY): 22 min  Charges:  $Therapeutic Exercise: 8-22 mins                     Candie Mile, PT,  DPT Physical Therapist [facility] [place] 02/23/2022, 9:18 AM

## 2022-02-25 ENCOUNTER — Other Ambulatory Visit: Payer: Self-pay | Admitting: Physician Assistant

## 2022-02-26 DIAGNOSIS — N390 Urinary tract infection, site not specified: Secondary | ICD-10-CM | POA: Diagnosis not present

## 2022-02-26 DIAGNOSIS — K219 Gastro-esophageal reflux disease without esophagitis: Secondary | ICD-10-CM | POA: Diagnosis not present

## 2022-02-26 DIAGNOSIS — M5416 Radiculopathy, lumbar region: Secondary | ICD-10-CM | POA: Diagnosis not present

## 2022-02-26 DIAGNOSIS — R296 Repeated falls: Secondary | ICD-10-CM | POA: Diagnosis not present

## 2022-02-26 DIAGNOSIS — Z86711 Personal history of pulmonary embolism: Secondary | ICD-10-CM | POA: Diagnosis not present

## 2022-02-26 DIAGNOSIS — R339 Retention of urine, unspecified: Secondary | ICD-10-CM | POA: Diagnosis not present

## 2022-02-26 DIAGNOSIS — R569 Unspecified convulsions: Secondary | ICD-10-CM | POA: Diagnosis not present

## 2022-02-26 DIAGNOSIS — K5900 Constipation, unspecified: Secondary | ICD-10-CM | POA: Diagnosis not present

## 2022-02-26 DIAGNOSIS — R252 Cramp and spasm: Secondary | ICD-10-CM | POA: Diagnosis not present

## 2022-02-26 DIAGNOSIS — R55 Syncope and collapse: Secondary | ICD-10-CM | POA: Diagnosis not present

## 2022-02-27 ENCOUNTER — Institutional Professional Consult (permissible substitution): Payer: Medicare Other | Admitting: Neurology

## 2022-02-27 NOTE — Telephone Encounter (Signed)
Please schedule appt

## 2022-02-28 ENCOUNTER — Telehealth: Payer: Self-pay | Admitting: Physician Assistant

## 2022-02-28 ENCOUNTER — Ambulatory Visit (INDEPENDENT_AMBULATORY_CARE_PROVIDER_SITE_OTHER): Payer: Medicare Other | Admitting: Neurology

## 2022-02-28 ENCOUNTER — Encounter: Payer: Self-pay | Admitting: Neurology

## 2022-02-28 ENCOUNTER — Inpatient Hospital Stay: Payer: Medicare Other | Attending: Physician Assistant | Admitting: Physician Assistant

## 2022-02-28 VITALS — BP 109/75 | HR 115 | Ht 67.0 in | Wt 99.5 lb

## 2022-02-28 DIAGNOSIS — G08 Intracranial and intraspinal phlebitis and thrombophlebitis: Secondary | ICD-10-CM

## 2022-02-28 DIAGNOSIS — R443 Hallucinations, unspecified: Secondary | ICD-10-CM | POA: Diagnosis not present

## 2022-02-28 DIAGNOSIS — I951 Orthostatic hypotension: Secondary | ICD-10-CM

## 2022-02-28 DIAGNOSIS — R413 Other amnesia: Secondary | ICD-10-CM | POA: Diagnosis not present

## 2022-02-28 DIAGNOSIS — I82409 Acute embolism and thrombosis of unspecified deep veins of unspecified lower extremity: Secondary | ICD-10-CM | POA: Insufficient documentation

## 2022-02-28 DIAGNOSIS — I2693 Single subsegmental pulmonary embolism without acute cor pulmonale: Secondary | ICD-10-CM | POA: Insufficient documentation

## 2022-02-28 DIAGNOSIS — F17210 Nicotine dependence, cigarettes, uncomplicated: Secondary | ICD-10-CM | POA: Insufficient documentation

## 2022-02-28 DIAGNOSIS — Z8541 Personal history of malignant neoplasm of cervix uteri: Secondary | ICD-10-CM | POA: Insufficient documentation

## 2022-02-28 DIAGNOSIS — R269 Unspecified abnormalities of gait and mobility: Secondary | ICD-10-CM | POA: Diagnosis not present

## 2022-02-28 NOTE — Progress Notes (Signed)
[facility]  PATIENT: [name] DOB: [birth year]  REFERRING DOCTOR OR PCP: Dr. Lujean Amel SOURCE: Patient, notes from Dr. Rolena Infante, imaging reports and lumbar MRI and CT images personally reviewed.   _________________________________   HISTORICAL  CHIEF COMPLAINT:  Chief Complaint  Patient presents with   Follow-up    Pt in room #11 with her daughter. Pt here today to f/u memory loss.    HISTORY OF PRESENT ILLNESS:  [name] is a 66 y.o. woman with reduced memory and poor gait.  02/28/2022 She has been more forgetful the last couple months.   She is confused at times.  Example that the daughter gave was that she has not remembered where she is and thought her husband was still alive..   Her daughter also notes she has paranoid delusions at times.   She does not have a memory of these events.    She has wandered some at night and does not remember.   She sometimes sees people other people don't see.   In the past, she also has had some memory issues and she actually performed about the same today on the [facility] cognitive assessment as she did in 2021.  In August, she had mental status changes and seizures.  The CT head showed "Filling defect within the left sigmoid sinus and jugular vein is suspicious for nonocclusive thrombus. Consider confirmation with dedicated CT venogram of the head. "   CT venogram confirmed filling defects in left transverse and sigmoid sinus and internal jugular vein.  She also had blood clots in her legs.  This led to a hospitalization in [place] and she was placed on Lovenox.  For the seizure she was placed on Depakote.  Some medications were changed.  Due to memory issues, Xanax was discontinued.  She had a short admission to [facility] and several emergency room visits due to weakness and orthostatic hypotension and falls.  She was started on midodrine for the orthostatic hypotension.  Echocardiogram was normal.  She is  currently at [facility].  Gait is worse than earlier in the year and she has had some falls.  Additionally, she has passed out a few times.    She may go to Assisted living soon.  Before this, she had been living with her daughter most of the past year.    Before that she was living independently some of the time but with family other time.  Family would need to help for some tasks.  I had seen her a couple times in 2021 for fibromyalgia type symptoms, gait disturbance and memory loss.  Labs in 2021 showed low B12 (146 with 180-914 normal).  CK was elevated at 836 (<234 is normal) while hospitalized but was normal when we checked at the last visit.    TSH, ammonia, lactic acid was normal.         02/28/2022    5:01 PM 02/28/2022    4:06 PM 01/04/2020   11:08 AM 07/02/2019    9:26 AM  Montreal Cognitive Assessment   Visuospatial/ Executive (0/5) '2 2 2 2  '$ Naming (0/3) '3 3 3 2  '$ Attention: Read list of digits (0/2) '2 2 2 2  '$ Attention: Read list of letters (0/1) '1 1 1 1  '$ Attention: Serial 7 subtraction starting at 100 (0/3) '2 2 1 2  '$ Language: Repeat phrase (0/2) '1 1 2 2  '$ Language : Fluency (0/1) 0 0 0 0  Abstraction (0/2) 2 2  2 2  Delayed Recall (0/5) '4 4 3 2  '$ Orientation (0/6) '5 5 5 5  '$ Total '22 22 21 20  '$ Adjusted Score (based on education) '23  22 21     '$ Impression: This NCV/EMG study shows the following: 1. Most of the proximal muscles showed small polyphasic motor units that recruited normally. This could be consistent with a mild myopathy. 2. Although the peroneal motor responses had reduced amplitudes, muscles innervated by that nerve did not show a neuropathic pattern. Other motor and sensory nerves were essentially normal and this is likely to be an incidental finding rather than an indicator of neuropathy 3.   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  Brain parenchyma appeared normal.  There were no acute findings.  CT scan lumbar spine 01/06/2019 showed prior fusion and posterior decompression at the L4-5 level with incorporation within the intervertebral disc spacer, however minimal incorporation across the remaining endplates at this level. Hardware is intact without evidence of loosening.   Probable mild canal stenosis at the L3-4 level.  L4-5 far lateral disc material appears to contact the exited left L4 nerve root. . Central disc protrusion at L5-S1 without evidence of canal stenosis.  MRI of the lumbar spine 09/11/2005 showed mild spinal stenosis at L4-L5 due to disc bulging, facet hypertrophy and ligamenta flava hypertrophy.  There was lateral recess stenosis.  L5-S1 showed facet hypertrophy but no spinal stenosis or foraminal narrowing.  Other levels appeared normal.  REVIEW OF SYSTEMS: Constitutional: No fevers, chills, sweats, or change in appetite Eyes: No visual changes, double vision, eye pain Ear, nose and throat: No hearing loss, ear pain, nasal congestion, sore throat Cardiovascular: No chest pain, palpitations Respiratory:  No shortness of breath at rest or with exertion.   No wheezes GastrointestinaI: No nausea, vomiting, diarrhea, abdominal pain, fecal incontinence Genitourinary:  No dysuria, urinary retention or frequency.  No nocturia. Musculoskeletal: As above  integumentary: No rash, pruritus, skin lesions Neurological: as above Psychiatric: No depression at this time.  No anxiety Endocrine: No palpitations, diaphoresis, change in appetite, change in weigh or increased thirst Hematologic/Lymphatic:  No anemia, purpura, petechiae. Allergic/Immunologic: No itchy/runny eyes, nasal congestion, recent allergic reactions, rashes  ALLERGIES: Allergies  Allergen Reactions   Penicillins Other (See Comments)    Yeast infection Did it  involve swelling of the face/tongue/throat, SOB, or low BP? No Did it involve sudden or severe rash/hives, skin peeling, or any reaction on the inside of your mouth or nose? No Did you need to seek medical attention at a hospital or doctor's office? Yes When did it last happen? More than 5 years ago  If all above answers are "NO", may proceed with cephalosporin use.     HOME MEDICATIONS:  Current Outpatient Medications:    amitriptyline (ELAVIL) 25 MG tablet, TAKE 2 TABLETS BY MOUTH AT BEDTIME., Disp: 180 tablet, Rfl: 0   b complex vitamins capsule, Take 1 capsule by mouth daily., Disp: , Rfl:    divalproex (DEPAKOTE) 250 MG DR tablet, TAKE 1 TABLET (250 MG TOTAL) BY MOUTH IN THE MORNING, AT NOON, AND AT BEDTIME., Disp: 90 tablet, Rfl: 0   enoxaparin (LOVENOX) 60 MG/0.6ML injection, Inject 0.5 mLs (50 mg total) into the skin 2 (two) times daily., Disp: 30 mL, Rfl: 3  gabapentin (NEURONTIN) 100 MG capsule, Take 1 capsule (100 mg total) by mouth 2 (two) times daily. 1 po bid for 4 days, then 1 po tid. (Patient taking differently: Take 100 mg by mouth 3 (three) times daily. '100mg'$  po bid for 4 days, then 100 mg  po tid.), Disp: 60 capsule, Rfl: 1   hydrOXYzine (ATARAX) 50 MG tablet, Take 1 tablet (50 mg total) by mouth 3 (three) times daily as needed. (Patient taking differently: Take 50 mg by mouth 3 (three) times daily as needed for anxiety.), Disp: 60 tablet, Rfl: 0   lactulose (CHRONULAC) 10 GM/15ML solution, Take 15 mLs (10 g total) by mouth 2 (two) times daily as needed for mild constipation. Titrate for 2-3 BM daily. Hold if loose stool., Disp: 236 mL, Rfl: 0   midodrine (PROAMATINE) 2.5 MG tablet, Take 1 tablet (2.5 mg total) by mouth 3 (three) times daily with meals., Disp: 90 tablet, Rfl: 0   omeprazole (PRILOSEC) 40 MG capsule, Take 40 mg by mouth daily as needed (acid reflux)., Disp: , Rfl:    risperiDONE (RISPERDAL) 3 MG tablet, Take 1 tablet (3 mg total) by mouth at bedtime., Disp: 90  tablet, Rfl: 0   tiZANidine (ZANAFLEX) 4 MG tablet, Take 4 mg by mouth every 8 (eight) hours as needed for muscle spasms., Disp: , Rfl:   PAST MEDICAL HISTORY: Past Medical History:  Diagnosis Date   Anxiety    anxiety and mild depressive order systoms   Bipolar disorder ([place])     (02/20/2013)   Cervical cancer ([place]) 03/19/1980   Cholelithiasis 11/11/2021   Depression    Dysrhythmia    Headache(784.0)    "monthly" (02/20/2013)   Hyperlipidemia 11/11/2021   Pacemaker    medtronic   PONV (postoperative nausea and vomiting)    Sinus pause 02/14/2013   Syncope and collapse    echo-April 12,2012-EF 55% Echo normal; recorder with prolonged sinus pauses --> status post   Tobacco abuse    Vertigo     PAST SURGICAL HISTORY: Past Surgical History:  Procedure Laterality Date   ABDOMINAL HYSTERECTOMY  03/19/1980   "partial"   BACK SURGERY     CERVICAL FUSION Left 11/2013   C4-C6   CERVICAL FUSION     3,4,5,   INSERT / REPLACE / REMOVE PACEMAKER  02/20/2013   medtronic   LOOP RECORDER IMPLANT N/A 11/06/2012   Procedure: LINQ LOOP RECORDER IMPLANT;  Surgeon: Sanda Klein, MD;  Location: [place] CATH LAB;  Service: Cardiovascular;  Laterality: N/A;   NECK SURGERY  11/17/2013   PACEMAKER PLACEMENT N/A 02/16/2013   PERMANENT PACEMAKER INSERTION N/A 02/20/2013   Procedure: PERMANENT PACEMAKER INSERTION;  Surgeon: Sanda Klein, MD;  Location: [place] CATH LAB;  Service: Cardiovascular;  Laterality: N/A;   POSTERIOR LUMBAR FUSION  ~ 2009   ROBOTIC ASSISTED SALPINGO OOPHERECTOMY Bilateral 11/25/2018   Procedure: XI ROBOTIC ASSISTED BILATERAL SALPINGO OOPHORECTOMY and LYSIS OF ADHESIONS.;  Surgeon: Everitt Amber, MD;  Location: [facility];  Service: Gynecology;  Laterality: Bilateral;   SPINAL FUSION  03/19/2014   TONSILLECTOMY     TRANSTHORACIC ECHOCARDIOGRAM  07/11/2010   The left atrial size is normal.There is no evidence of mitral vavle prolaspe.Right ventriicular systolic pressure is normal . Injection of  contrast documented no interatrial shunt. Essentially normal 2D echo -doppler study     FAMILY HISTORY: Family History  Adopted: Yes  Problem Relation Age of Onset   Heart attack Brother     SOCIAL HISTORY:  Social History  Socioeconomic History   Marital status: Widowed    Spouse name: Not on file   Number of children: 3   Years of education: 108   Highest education level: Not on file  Occupational History   Not on file  Tobacco Use   Smoking status: Every Day    Packs/day: 0.50    Years: 40.00    Total pack years: 20.00    Types: Cigarettes   Smokeless tobacco: Never  Vaping Use   Vaping Use: Never used  Substance and Sexual Activity   Alcohol use: No   Drug use: No   Sexual activity: Yes  Other Topics Concern   Not on file  Social History Narrative   Married 29 years . Has 3 children. Current smoker -1 pack a day-for 36 years .Alcohol :  1 beer on occasion.   She is soon to be a grandmother.  His before to going on a trip up to the [place] area to be closer to family when the baby is born.  She is extremely excited.   She very much would like to move back up to that area to be closer to family, but the whether is it just too cold in the winter.   Right handed    Soda daily   Lives alone   Social Determinants of Health   Financial Resource Strain: Low Risk  (10/11/2020)   Overall Financial Resource Strain (CARDIA)    Difficulty of Paying Living Expenses: Not hard at all  Food Insecurity: No Food Insecurity (11/22/2021)   Hunger Vital Sign    Worried About Running Out of Food in the Last Year: Never true    Ran Out of Food in the Last Year: Never true  Transportation Needs: No Transportation Needs (11/22/2021)   PRAPARE - Hydrologist (Medical): No    Lack of Transportation (Non-Medical): No  Physical Activity: Not on file  Stress: No Stress Concern Present (10/11/2020)   [place]    Feeling of Stress : Only a little  Social Connections: Not on file  Intimate Partner Violence: Not At Risk (11/22/2021)   Humiliation, Afraid, Rape, and Kick questionnaire    Fear of Current or Ex-Partner: No    Emotionally Abused: No    Physically Abused: No    Sexually Abused: No     PHYSICAL EXAM  Vitals:   02/28/22 1554  BP: 109/75  Pulse: (!) 115  Weight: 99 lb 8 oz (45.1 kg)  Height: '5\' 7"'$  (1.702 m)    Body mass index is 15.58 kg/m.   General: The patient is well-developed and well-nourished and in no acute distress  HEENT:  Head is [place]/AT.  Sclera are anicteric.  Neck/heart: No carotid artery bruits.  The neck is nontender with good range of motion.  The heart rate is regular.  No murmurs gallops or rubs.  Skin: Extremities are without rash or  edema.   Neurologic Exam  Mental status: Alert and oriented.  Reduced memory and focus.   Cranial nerves: Extraocular movements are full. There is good facial sensation to soft touch bilaterally.Facial strength is normal. No obvious hearing deficits are noted.  Motor:  Muscle bulk is normal.   Tone is normal. Strength is  5 / 5 in all 4 extremities except 4+/5 right EHL.     Sensory: She has mild reduced vibration in the feet relative to the knees.  Sensation was normal in the arms.  Coordination: Cerebellar testing reveals good finger-nose-finger and heel-to-shin bilaterally.  Gait and station: Station is normal.  She does not need support to walk.  The stride was mildly reduced and her turn was off balance.  Romberg is negative.   Reflexes: Deep tendon reflexes are symmetric and normal bilaterally.       DIAGNOSTIC DATA (LABS, IMAGING, TESTING) - I reviewed patient records, labs, notes, testing and imaging myself where available.  Lab Results  Component Value Date   WBC 5.8 02/21/2022   HGB 13.9 02/21/2022   HCT 41.2 02/21/2022   MCV 88.4 02/21/2022   PLT 220 02/21/2022       Component Value Date/Time   NA 135 02/21/2022 0451   NA 139 01/04/2020 1147   K 4.8 02/21/2022 0451   CL 101 02/21/2022 0451   CO2 28 02/21/2022 0451   GLUCOSE 81 02/21/2022 0451   BUN 17 02/21/2022 0451   BUN 11 01/04/2020 1147   CREATININE 0.78 02/21/2022 0451   CREATININE 0.83 12/05/2021 1509   CREATININE 1.08 (H) 09/24/2018 1019   CALCIUM 9.1 02/21/2022 0451   PROT 5.5 (L) 02/17/2022 0338   PROT 6.6 01/04/2020 1147   ALBUMIN 3.2 (L) 02/17/2022 0338   ALBUMIN 3.4 (L) 02/25/2020 0910   AST 12 (L) 02/17/2022 0338   AST 19 12/05/2021 1509   ALT 9 02/17/2022 0338   ALT 29 12/05/2021 1509   ALKPHOS 54 02/17/2022 0338   BILITOT 0.7 02/17/2022 0338   BILITOT 0.6 12/05/2021 1509   GFRNONAA >60 02/21/2022 0451   GFRNONAA >60 12/05/2021 1509   GFRNONAA 56 (L) 09/24/2018 1019   GFRAA 69 01/04/2020 1147   GFRAA 65 09/24/2018 1019   Lab Results  Component Value Date   CHOL 256 (H) 09/24/2018   HDL 50 09/24/2018   LDLCALC 179 (H) 09/24/2018   TRIG 130 09/24/2018   CHOLHDL 5.1 (H) 09/24/2018       ASSESSMENT AND PLAN  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  She is currently on Lovenox for dural sinus venous thrombosis.  I will recheck a CT venogram.   2.  Although she had a reduced score on the [facility] cognitive assessment (22/30 which would be mild dementia/mild cognitive impairment), she actually scored similar to her performance in 2021.  I will check the amyloid beta 42/40 ratio and [id number].  If both are abnormal she could have Alzheimer's disease.  It is possible since she also has hallucinations and has had some difficulties with gait and orthostatic hypotension that she has Lewy body disease.  I will have her try Aricept to see if that helps.. 3.   I will add a risperidone in the  afternoon since she has had some agitation in the afternoon/evenings.   4.   She will return to see [place] in 3 months or sooner if there are new or worsening neurologic symptoms.   45-minute office visit with the majority of the time spent face-to-face for history and physical, discussion/counseling and decision-making.  Additional time with record review and documentation.   Marquasha Brutus A. Felecia Shelling, MD, [facility] [id number], 9:39 PM Certified in Neurology, Clinical Neurophysiology, Sleep Medicine and Neuroimaging  [facility] [address], [place], [place] [address] [phone]

## 2022-02-28 NOTE — Telephone Encounter (Signed)
Called patient and both daughters. Left voicemails about missed virtual appointment. Will f/u

## 2022-03-01 ENCOUNTER — Telehealth: Payer: Self-pay | Admitting: Neurology

## 2022-03-01 NOTE — Telephone Encounter (Signed)
[employer] medicare/[place] medicaid NPR sent to GI [phone] 

## 2022-03-02 DIAGNOSIS — R55 Syncope and collapse: Secondary | ICD-10-CM | POA: Diagnosis not present

## 2022-03-02 DIAGNOSIS — R269 Unspecified abnormalities of gait and mobility: Secondary | ICD-10-CM | POA: Diagnosis not present

## 2022-03-02 DIAGNOSIS — I951 Orthostatic hypotension: Secondary | ICD-10-CM | POA: Diagnosis not present

## 2022-03-02 DIAGNOSIS — N390 Urinary tract infection, site not specified: Secondary | ICD-10-CM | POA: Diagnosis not present

## 2022-03-03 LAB — ATN PROFILE
A -- Beta-amyloid 42/40 Ratio: 0.117 (ref >0.102)
Beta-amyloid 40: 286.33 pg/mL
Beta-amyloid 42: 33.5 pg/mL
N -- NfL, Plasma: 4.99 pg/mL — ABNORMAL HIGH (ref 0.00–4.61)
T -- p-tau181: 0.61 pg/mL (ref 0.00–0.97)

## 2022-03-03 LAB — THYROID PANEL WITH TSH
Free Thyroxine Index: 2.1 (ref 1.2–4.9)
T3 Uptake Ratio: 29 % (ref 24–39)
T4, Total: 7.2 ug/dL (ref 4.5–12.0)
TSH: 0.624 u[IU]/mL (ref 0.450–4.500)

## 2022-03-05 ENCOUNTER — Other Ambulatory Visit: Payer: Self-pay | Admitting: *Deleted

## 2022-03-05 ENCOUNTER — Encounter: Payer: Self-pay | Admitting: Neurology

## 2022-03-05 DIAGNOSIS — R262 Difficulty in walking, not elsewhere classified: Secondary | ICD-10-CM | POA: Diagnosis not present

## 2022-03-05 DIAGNOSIS — Z741 Need for assistance with personal care: Secondary | ICD-10-CM | POA: Diagnosis not present

## 2022-03-05 DIAGNOSIS — K219 Gastro-esophageal reflux disease without esophagitis: Secondary | ICD-10-CM | POA: Diagnosis not present

## 2022-03-05 DIAGNOSIS — R2681 Unsteadiness on feet: Secondary | ICD-10-CM | POA: Diagnosis not present

## 2022-03-05 DIAGNOSIS — Z9181 History of falling: Secondary | ICD-10-CM | POA: Diagnosis not present

## 2022-03-05 DIAGNOSIS — M6281 Muscle weakness (generalized): Secondary | ICD-10-CM | POA: Diagnosis not present

## 2022-03-05 DIAGNOSIS — R55 Syncope and collapse: Secondary | ICD-10-CM | POA: Diagnosis not present

## 2022-03-05 DIAGNOSIS — R413 Other amnesia: Secondary | ICD-10-CM

## 2022-03-05 DIAGNOSIS — G4700 Insomnia, unspecified: Secondary | ICD-10-CM

## 2022-03-05 DIAGNOSIS — R443 Hallucinations, unspecified: Secondary | ICD-10-CM

## 2022-03-05 DIAGNOSIS — R339 Retention of urine, unspecified: Secondary | ICD-10-CM | POA: Diagnosis not present

## 2022-03-05 DIAGNOSIS — M6259 Muscle wasting and atrophy, not elsewhere classified, multiple sites: Secondary | ICD-10-CM | POA: Diagnosis not present

## 2022-03-05 DIAGNOSIS — M5416 Radiculopathy, lumbar region: Secondary | ICD-10-CM | POA: Diagnosis not present

## 2022-03-05 DIAGNOSIS — I951 Orthostatic hypotension: Secondary | ICD-10-CM | POA: Diagnosis not present

## 2022-03-05 MED ORDER — HYDROXYZINE HCL 25 MG PO TABS: 25.0000 mg | ORAL_TABLET | Freq: Two times a day (BID) | ORAL | 5 refills | Status: DC | PRN

## 2022-03-05 MED ORDER — HYDROXYZINE HCL 25 MG PO TABS: 50.0000 mg | ORAL_TABLET | Freq: Two times a day (BID) | ORAL | 5 refills | Status: DC | PRN

## 2022-03-05 MED ORDER — HYDROXYZINE HCL 10 MG PO TABS: 10.0000 mg | ORAL_TABLET | Freq: Two times a day (BID) | ORAL | 5 refills | Status: DC | PRN

## 2022-03-06 ENCOUNTER — Ambulatory Visit
Admission: RE | Admit: 2022-03-06 | Discharge: 2022-03-06 | Disposition: A | Discharge status: Home / Self Care | Payer: Medicare Other | Source: Ambulatory Visit | Attending: Neurology | Admitting: Neurology

## 2022-03-06 ENCOUNTER — Other Ambulatory Visit: Payer: Self-pay | Admitting: *Deleted

## 2022-03-06 DIAGNOSIS — I676 Nonpyogenic thrombosis of intracranial venous system: Secondary | ICD-10-CM | POA: Diagnosis not present

## 2022-03-06 DIAGNOSIS — Z741 Need for assistance with personal care: Secondary | ICD-10-CM | POA: Diagnosis not present

## 2022-03-06 DIAGNOSIS — M6281 Muscle weakness (generalized): Secondary | ICD-10-CM | POA: Diagnosis not present

## 2022-03-06 DIAGNOSIS — Z8669 Personal history of other diseases of the nervous system and sense organs: Secondary | ICD-10-CM | POA: Diagnosis not present

## 2022-03-06 DIAGNOSIS — R2681 Unsteadiness on feet: Secondary | ICD-10-CM | POA: Diagnosis not present

## 2022-03-06 DIAGNOSIS — G08 Intracranial and intraspinal phlebitis and thrombophlebitis: Secondary | ICD-10-CM

## 2022-03-06 DIAGNOSIS — R55 Syncope and collapse: Secondary | ICD-10-CM | POA: Diagnosis not present

## 2022-03-06 DIAGNOSIS — M6259 Muscle wasting and atrophy, not elsewhere classified, multiple sites: Secondary | ICD-10-CM | POA: Diagnosis not present

## 2022-03-06 DIAGNOSIS — Z9181 History of falling: Secondary | ICD-10-CM | POA: Diagnosis not present

## 2022-03-06 DIAGNOSIS — I951 Orthostatic hypotension: Secondary | ICD-10-CM | POA: Diagnosis not present

## 2022-03-06 DIAGNOSIS — R262 Difficulty in walking, not elsewhere classified: Secondary | ICD-10-CM | POA: Diagnosis not present

## 2022-03-06 MED ORDER — IOPAMIDOL (ISOVUE-300) INJECTION 61%
75.0000 mL | Freq: Once | INTRAVENOUS | Status: AC | PRN
  Administered 2022-03-06: 75 mL via INTRAVENOUS

## 2022-03-06 NOTE — Patient Outreach (Signed)
Per [facility] Mrs. Sandberg resides in [facility]. PCP [facility] has [facility] care management services.   Secure communication sent to [facility] social worker to make aware writer is following for transition plans/needs.   Marthenia Rolling, MSN, RN,BSN [facility] Acute Care Coordinator [phone] (Direct dial)

## 2022-03-06 NOTE — Progress Notes (Signed)
Unless her psychiatrist is able to get her in a different way, the best method may be for them to take her to the psychiatric urgent care across the street.  There she can have an evaluation and they can decide if they will accept her

## 2022-03-08 DIAGNOSIS — M5416 Radiculopathy, lumbar region: Secondary | ICD-10-CM | POA: Diagnosis not present

## 2022-03-08 DIAGNOSIS — Z86711 Personal history of pulmonary embolism: Secondary | ICD-10-CM | POA: Diagnosis not present

## 2022-03-08 DIAGNOSIS — K219 Gastro-esophageal reflux disease without esophagitis: Secondary | ICD-10-CM | POA: Diagnosis not present

## 2022-03-13 ENCOUNTER — Inpatient Hospital Stay: Payer: Medicare Other

## 2022-03-13 ENCOUNTER — Telehealth: Payer: Medicare Other | Admitting: Physician Assistant

## 2022-03-13 ENCOUNTER — Other Ambulatory Visit: Payer: Self-pay | Admitting: Neurology

## 2022-03-13 ENCOUNTER — Other Ambulatory Visit: Payer: Self-pay

## 2022-03-13 ENCOUNTER — Inpatient Hospital Stay (HOSPITAL_BASED_OUTPATIENT_CLINIC_OR_DEPARTMENT_OTHER): Payer: Medicare Other | Admitting: Physician Assistant

## 2022-03-13 VITALS — BP 98/74 | HR 108 | Temp 97.7°F | Resp 16 | Wt 104.2 lb

## 2022-03-13 DIAGNOSIS — G9340 Encephalopathy, unspecified: Secondary | ICD-10-CM

## 2022-03-13 DIAGNOSIS — Z86718 Personal history of other venous thrombosis and embolism: Secondary | ICD-10-CM

## 2022-03-13 DIAGNOSIS — Z8541 Personal history of malignant neoplasm of cervix uteri: Secondary | ICD-10-CM | POA: Diagnosis not present

## 2022-03-13 DIAGNOSIS — I2693 Single subsegmental pulmonary embolism without acute cor pulmonale: Secondary | ICD-10-CM | POA: Diagnosis not present

## 2022-03-13 DIAGNOSIS — F17210 Nicotine dependence, cigarettes, uncomplicated: Secondary | ICD-10-CM | POA: Diagnosis not present

## 2022-03-13 DIAGNOSIS — I82409 Acute embolism and thrombosis of unspecified deep veins of unspecified lower extremity: Secondary | ICD-10-CM | POA: Diagnosis not present

## 2022-03-13 DIAGNOSIS — Z86711 Personal history of pulmonary embolism: Secondary | ICD-10-CM

## 2022-03-13 LAB — CBC WITH DIFFERENTIAL (CANCER CENTER ONLY)
Abs Immature Granulocytes: 0.02 10*3/uL (ref 0.00–0.07)
Basophils Absolute: 0.1 10*3/uL (ref 0.0–0.1)
Basophils Relative: 1 %
Eosinophils Absolute: 0.2 10*3/uL (ref 0.0–0.5)
Eosinophils Relative: 3 %
HCT: 36.7 % (ref 36.0–46.0)
Hemoglobin: 11.9 g/dL — ABNORMAL LOW (ref 12.0–15.0)
Immature Granulocytes: 0 %
Lymphocytes Relative: 36 %
Lymphs Abs: 3 10*3/uL (ref 0.7–4.0)
MCH: 29.6 pg (ref 26.0–34.0)
MCHC: 32.4 g/dL (ref 30.0–36.0)
MCV: 91.3 fL (ref 80.0–100.0)
Monocytes Absolute: 0.7 10*3/uL (ref 0.1–1.0)
Monocytes Relative: 9 %
Neutro Abs: 4.3 10*3/uL (ref 1.7–7.7)
Neutrophils Relative %: 51 %
Platelet Count: 196 10*3/uL (ref 150–400)
RBC: 4.02 MIL/uL (ref 3.87–5.11)
RDW: 14.7 % (ref 11.5–15.5)
WBC Count: 8.3 10*3/uL (ref 4.0–10.5)
nRBC: 0 % (ref 0.0–0.2)

## 2022-03-13 LAB — CMP (CANCER CENTER ONLY)
ALT: 17 U/L (ref 0–44)
AST: 16 U/L (ref 15–41)
Albumin: 3.9 g/dL (ref 3.5–5.0)
Alkaline Phosphatase: 52 U/L (ref 38–126)
Anion gap: 6 (ref 5–15)
BUN: 17 mg/dL (ref 8–23)
CO2: 30 mmol/L (ref 22–32)
Calcium: 9.4 mg/dL (ref 8.9–10.3)
Chloride: 108 mmol/L (ref 98–111)
Creatinine: 0.84 mg/dL (ref 0.44–1.00)
GFR, Estimated: >60 mL/min (ref >60)
Glucose, Bld: 100 mg/dL — ABNORMAL HIGH (ref 70–99)
Potassium: 4.4 mmol/L (ref 3.5–5.1)
Sodium: 144 mmol/L (ref 135–145)
Total Bilirubin: 0.3 mg/dL (ref 0.3–1.2)
Total Protein: 6.2 g/dL — ABNORMAL LOW (ref 6.5–8.1)

## 2022-03-14 ENCOUNTER — Other Ambulatory Visit: Payer: Self-pay | Admitting: Physician Assistant

## 2022-03-14 DIAGNOSIS — Z7901 Long term (current) use of anticoagulants: Secondary | ICD-10-CM | POA: Diagnosis not present

## 2022-03-14 DIAGNOSIS — K219 Gastro-esophageal reflux disease without esophagitis: Secondary | ICD-10-CM | POA: Diagnosis not present

## 2022-03-14 DIAGNOSIS — M5416 Radiculopathy, lumbar region: Secondary | ICD-10-CM | POA: Diagnosis not present

## 2022-03-14 NOTE — Progress Notes (Signed)
[place] [phone]   [phone]  PROGRESS NOTE  Patient Care Team: Tabitha Amel, MD as PCP - General (Family Medicine) [name] as Physician Assistant (Psychiatry)  Hematological/Oncological History 1) 10/23/2021-11/11/2021: Admitted In [place] staying 45 seconds and witnessed by her daughter.  CT imaging revealed an acute right lobar, segmental and subsegmental pulmonary emboli.  Lower extremity Doppler was positive for DVT.  She was started on IV heparin.  Hypercoagulable testing was performed.  Findings revealed beta-2 glycoprotein IgM elevated greater than 150 and anticardiolipin antibodies IgM elevated above 103.5.Patient was discharged on Lovenox bridging to Coumadin due to concern for possible antiphospholipid syndrome.  2. 11/18/2021-11/26/2021: Readmitted at [facility] due to altered mental status and was found to have a UTI and therapeutic INR. Her INR was found to be 3.6 and subsequently 4.2 on 11/22/2021.  Patient was switched to Lovenox 1 mg/kg twice daily due to difficulty controlling her INRs.  3. 12/05/2021: Established care with [facility]  CHIEF COMPLAINTS/PURPOSE OF CONSULTATION:  "DVT and PE concerning for antiphospholipid antibody syndrome"  HISTORY OF PRESENTING ILLNESS:  [name] 66 y.o. female returns for a follow up visit.  She is unaccompanied for this visit.  [name] reports that she is tolerating Lovenox injections without any signs of active bleeding. She was recently hospitalized due to orthostatic hypotension. She has been struggling with memory loss. Per review of records, her daughter reported patient having paranoid delusions at times.   [name] is planning to move into a assisted living. She adds that they will not allow her to be on lovenox injections and will likely need to switch to coumadin. She denies fevers, chills, night sweats, shortness of breath, chest pain, cough, nausea, vomiting,  diarrhea, constipation or peripheral edema. She has no other complaints. Rest of the 10 point ROS is below.   MEDICAL HISTORY:  Past Medical History:  Diagnosis Date   Anxiety    anxiety and mild depressive order systoms   Bipolar disorder ([place])     (02/20/2013)   Cervical cancer ([place]) 03/19/1980   Cholelithiasis 11/11/2021   Depression    Dysrhythmia    Headache(784.0)    "monthly" (02/20/2013)   Hyperlipidemia 11/11/2021   Pacemaker    medtronic   PONV (postoperative nausea and vomiting)    Sinus pause 02/14/2013   Syncope and collapse    echo-April 12,2012-EF 55% Echo normal; recorder with prolonged sinus pauses --> status post   Tobacco abuse    Vertigo     SURGICAL HISTORY: Past Surgical History:  Procedure Laterality Date   ABDOMINAL HYSTERECTOMY  03/19/1980   "partial"   BACK SURGERY     CERVICAL FUSION Left 11/2013   C4-C6   CERVICAL FUSION     3,4,5,   INSERT / REPLACE / REMOVE PACEMAKER  02/20/2013   medtronic   LOOP RECORDER IMPLANT N/A 11/06/2012   Procedure: LINQ LOOP RECORDER IMPLANT;  Surgeon: Sanda Klein, MD;  Location: [facility];  Service: Cardiovascular;  Laterality: N/A;   NECK SURGERY  11/17/2013   PACEMAKER PLACEMENT N/A 02/16/2013   PERMANENT PACEMAKER INSERTION N/A 02/20/2013   Procedure: PERMANENT PACEMAKER INSERTION;  Surgeon: Sanda Klein, MD;  Location: [facility];  Service: Cardiovascular;  Laterality: N/A;   POSTERIOR LUMBAR FUSION  ~ 2009   ROBOTIC ASSISTED SALPINGO OOPHERECTOMY Bilateral 11/25/2018   Procedure: XI ROBOTIC ASSISTED BILATERAL SALPINGO OOPHORECTOMY and LYSIS OF ADHESIONS.;  Surgeon: Everitt Amber, MD;  Location:  [facility];  Service: Gynecology;  Laterality: Bilateral;   SPINAL FUSION  03/19/2014   TONSILLECTOMY     TRANSTHORACIC ECHOCARDIOGRAM  07/11/2010   The left atrial size is normal.There is no evidence of mitral vavle prolaspe.Right ventriicular systolic pressure is normal . Injection of contrast documented no interatrial  shunt. Essentially normal 2D echo -doppler study     SOCIAL HISTORY: Social History   Socioeconomic History   Marital status: Widowed    Spouse name: Not on file   Number of children: 3   Years of education: 66   Highest education level: Not on file  Occupational History   Not on file  Tobacco Use   Smoking status: Every Day    Packs/day: 0.50    Years: 40.00    Total pack years: 20.00    Types: Cigarettes   Smokeless tobacco: Never  Vaping Use   Vaping Use: Never used  Substance and Sexual Activity   Alcohol use: No   Drug use: No   Sexual activity: Yes  Other Topics Concern   Not on file  Social History Narrative   Married 29 years . Has 3 children. Current smoker -1 pack a day-for 36 years .Alcohol :  1 beer on occasion.   She is soon to be a grandmother.  His before to going on a trip up to the [place] area to be closer to family when the baby is born.  She is extremely excited.   She very much would like to move back up to that area to be closer to family, but the whether is it just too cold in the winter.   Right handed    Soda daily   Lives alone   Social Determinants of Health   Financial Resource Strain: Low Risk  (10/11/2020)   Overall Financial Resource Strain (CARDIA)    Difficulty of Paying Living Expenses: Not hard at all  Food Insecurity: No Food Insecurity (11/22/2021)   Hunger Vital Sign    Worried About Running Out of Food in the Last Year: Never true    Ran Out of Food in the Last Year: Never true  Transportation Needs: No Transportation Needs (11/22/2021)   PRAPARE - Hydrologist (Medical): No    Lack of Transportation (Non-Medical): No  Physical Activity: Not on file  Stress: No Stress Concern Present (10/11/2020)   [place]    Feeling of Stress : Only a little  Social Connections: Not on file  Intimate Partner Violence: Not At Risk  (11/22/2021)   Humiliation, Afraid, Rape, and Kick questionnaire    Fear of Current or Ex-Partner: No    Emotionally Abused: No    Physically Abused: No    Sexually Abused: No    FAMILY HISTORY: Family History  Adopted: Yes  Problem Relation Age of Onset   Heart attack Brother     ALLERGIES:  is allergic to penicillins.  MEDICATIONS:  Current Outpatient Medications  Medication Sig Dispense Refill   divalproex (DEPAKOTE) 250 MG DR tablet TAKE 1 TABLET (250 MG TOTAL) BY MOUTH IN THE MORNING, AT NOON, AND AT BEDTIME. 90 tablet 0   donepezil (ARICEPT) 5 MG tablet Take 5 mg by mouth daily.     enoxaparin (LOVENOX) 60 MG/0.6ML injection Inject 0.5 mLs (50 mg total) into the skin 2 (two) times daily. 30 mL 3   gabapentin (NEURONTIN) 100 MG capsule Take 1 capsule (  100 mg total) by mouth 2 (two) times daily. 1 po bid for 4 days, then 1 po tid. (Patient taking differently: Take 100 mg by mouth 3 (three) times daily. '100mg'$  po bid for 4 days, then 100 mg  po tid.) 60 capsule 1   hydrOXYzine (ATARAX) 25 MG tablet Take 1 tablet (25 mg total) by mouth 2 (two) times daily as needed. 60 tablet 5   lactulose (CHRONULAC) 10 GM/15ML solution Take 15 mLs (10 g total) by mouth 2 (two) times daily as needed for mild constipation. Titrate for 2-3 BM daily. Hold if loose stool. 236 mL 0   midodrine (PROAMATINE) 2.5 MG tablet Take 1 tablet (2.5 mg total) by mouth 3 (three) times daily with meals. 90 tablet 0   omeprazole (PRILOSEC) 40 MG capsule Take 40 mg by mouth daily as needed (acid reflux).     risperiDONE (RISPERDAL) 3 MG tablet Take 1 tablet (3 mg total) by mouth at bedtime. 90 tablet 0   amitriptyline (ELAVIL) 25 MG tablet TAKE 2 TABLETS BY MOUTH AT BEDTIME. (Patient not taking: Reported on 03/13/2022) 180 tablet 0   b complex vitamins capsule Take 1 capsule by mouth daily. (Patient not taking: Reported on 03/13/2022)     tiZANidine (ZANAFLEX) 4 MG tablet Take 4 mg by mouth every 8 (eight) hours as needed  for muscle spasms. (Patient not taking: Reported on 03/13/2022)     No current facility-administered medications for this visit.    REVIEW OF SYSTEMS:   Constitutional: ( - ) fevers, ( - )  chills , ( - ) night sweats Eyes: ( - ) blurriness of vision, ( - ) double vision, ( - ) watery eyes Ears, nose, mouth, throat, and face: ( - ) mucositis, ( - ) sore throat Respiratory: ( - ) cough, ( - ) dyspnea, ( - ) wheezes Cardiovascular: ( - ) palpitation, ( - ) chest discomfort, ( - ) lower extremity swelling Gastrointestinal:  ( - ) nausea, ( - ) heartburn, ( - ) change in bowel habits Skin: ( - ) abnormal skin rashes Lymphatics: ( - ) new lymphadenopathy, ( - ) easy bruising Neurological: ( - ) numbness, ( - ) tingling, ( - ) new weaknesses Behavioral/Psych: ( - ) mood change, ( - ) new changes  All other systems were reviewed with the patient and are negative.  PHYSICAL EXAMINATION: ECOG PERFORMANCE STATUS: 1 - Symptomatic but completely ambulatory  Vitals:   03/13/22 1503  BP: 98/74  Pulse: (!) 108  Resp: 16  Temp: 97.7 F (36.5 C)  SpO2: 100%   Filed Weights   03/13/22 1503  Weight: 104 lb 4 oz (47.3 kg)    GENERAL:thin appearing female in NAD  SKIN: skin color, texture, turgor are normal, no rashes or significant lesions EYES: conjunctiva are pink and non-injected, sclera clear OROPHARYNX: no exudate, no erythema; lips, buccal mucosa, and tongue normal  NECK: supple, non-tender LYMPH:  no palpable lymphadenopathy in the cervical or supraclavicular lymph nodes.  LUNGS: clear to auscultation and percussion with normal breathing effort HEART: regular rate & rhythm and no murmurs and no lower extremity edema Musculoskeletal: no cyanosis of digits and no clubbing  PSYCH: alert & oriented x 3, fluent speech NEURO: no focal motor/sensory deficits  LABORATORY DATA:  I have reviewed the data as listed    Latest Ref Rng & Units 03/13/2022    2:41 PM 02/21/2022    4:51 AM  02/17/2022    3:38  AM  CBC  WBC 4.0 - 10.5 K/uL 8.3  5.8  6.0   Hemoglobin 12.0 - 15.0 g/dL 11.9  13.9  12.7   Hematocrit 36.0 - 46.0 % 36.7  41.2  37.4   Platelets 150 - 400 K/uL 196  220  239        Latest Ref Rng & Units 03/13/2022    2:41 PM 02/21/2022    4:51 AM 02/17/2022    3:38 AM  CMP  Glucose 70 - 99 mg/dL 100  81  79   BUN 8 - 23 mg/dL '17  17  10   '$ Creatinine 0.44 - 1.00 mg/dL 0.84  0.78  0.80   Sodium 135 - 145 mmol/L 144  135  140   Potassium 3.5 - 5.1 mmol/L 4.4  4.8  4.1   Chloride 98 - 111 mmol/L 108  101  104   CO2 22 - 32 mmol/L '30  28  27   '$ Calcium 8.9 - 10.3 mg/dL 9.4  9.1  9.2   Total Protein 6.5 - 8.1 g/dL 6.2   5.5   Total Bilirubin 0.3 - 1.2 mg/dL 0.3   0.7   Alkaline Phos 38 - 126 U/L 52   54   AST 15 - 41 U/L 16   12   ALT 0 - 44 U/L 17   9     RADIOGRAPHIC STUDIES: I have personally reviewed the radiological images as listed and agreed with the findings in the report. CT VENOGRAM HEAD  Result Date: 03/08/2022 CLINICAL DATA:  Dural venous sinus thrombosis EXAM: CT VENOGRAM HEAD TECHNIQUE: Venographic phase images of the brain were obtained following the administration of intravenous contrast. Multiplanar reformats and maximum intensity projections were generated. RADIATION DOSE REDUCTION: This exam was performed according to the departmental dose-optimization program which includes automated exposure control, adjustment of the mA and/or kV according to patient size and/or use of iterative reconstruction technique. CONTRAST:  45m ISOVUE-300 IOPAMIDOL (ISOVUE-300) INJECTION 61% COMPARISON:  Head CT 02/12/2022. Neck CT 01/27/2022. CTA of the head neck 12/13/2019 FINDINGS: Heterogeneous, poor enhancement in the distal left transverse and sigmoid sinuses continuing into the jugular foramen with small left upper IJ. Internal channels of flow which are minimal at the lower sigmoid. 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 No  acute thrombus. No dilated veins or evidence of vascular malformation. Generalized cerebral volume loss. No infarct, hemorrhage, hydrocephalus, or mass. IMPRESSION: 1. Remote thrombosis of the left transverse and sigmoid dural sinuses with tiny channels of flow reaching the upper left IJ. 2. No acute or uncompensated venous thrombosis. Electronically Signed   By: JJorje GuildM.D.   On: 03/08/2022 06:58   ECHOCARDIOGRAM COMPLETE  Result Date: 02/13/2022    ECHOCARDIOGRAM REPORT   Patient Name:   TTRENDA CORLISSDate of Exam: 02/13/2022 Medical Rec #:  [id number]     Height:       67.0 in Accession #:    [id number]    Weight:       120.0 lb Date of Birth:  808-15-1957     BSA:          1.628 m Patient Age:    89years       BP:           152/101 mmHg Patient Gender: F              HR:  104 bpm. Exam Location:  Inpatient Procedure: 2D Echo, Color Doppler and Cardiac Doppler Indications:    syncope  History:        Patient has prior history of Echocardiogram examinations, most                 recent 05/04/2021. Pacemaker, Signs/Symptoms:Syncope; Risk                 Factors:Dyslipidemia and Current Smoker.  Sonographer:    Melissa Morford RDCS (AE, PE) Referring Phys: [id number]  1. Left ventricular ejection fraction, by estimation, is 60 to 65%. The left ventricle has normal function. The left ventricle has no regional wall motion abnormalities. Left ventricular diastolic parameters are consistent with Grade I diastolic dysfunction (impaired relaxation).  2. Right ventricular systolic function is normal. The right ventricular size is normal. There is normal pulmonary artery systolic pressure. The estimated right ventricular systolic pressure is 78.9 mmHg.  3. The mitral valve is normal in structure. Trivial mitral valve regurgitation. No evidence of mitral stenosis.  4. The aortic valve is tricuspid. Aortic valve regurgitation is not visualized. No aortic stenosis is present.  5.  The inferior vena cava is normal in size with greater than 50% respiratory variability, suggesting right atrial pressure of 3 mmHg. FINDINGS  Left Ventricle: Left ventricular ejection fraction, by estimation, is 60 to 65%. The left ventricle has normal function. The left ventricle has no regional wall motion abnormalities. The left ventricular internal cavity size was normal in size. There is  no left ventricular hypertrophy. Left ventricular diastolic parameters are consistent with Grade I diastolic dysfunction (impaired relaxation). Right Ventricle: The right ventricular size is normal. No increase in right ventricular wall thickness. Right ventricular systolic function is normal. There is normal pulmonary artery systolic pressure. The tricuspid regurgitant velocity is 2.49 m/s, and  with an assumed right atrial pressure of 3 mmHg, the estimated right ventricular systolic pressure is 38.1 mmHg. Left Atrium: Left atrial size was normal in size. Right Atrium: Right atrial size was normal in size. Pericardium: There is no evidence of pericardial effusion. Mitral Valve: The mitral valve is normal in structure. Trivial mitral valve regurgitation. No evidence of mitral valve stenosis. Tricuspid Valve: The tricuspid valve is normal in structure. Tricuspid valve regurgitation is mild . No evidence of tricuspid stenosis. Aortic Valve: The aortic valve is tricuspid. Aortic valve regurgitation is not visualized. No aortic stenosis is present. Pulmonic Valve: The pulmonic valve was normal in structure. Pulmonic valve regurgitation is trivial. No evidence of pulmonic stenosis. Aorta: The aortic root is normal in size and structure. Venous: The inferior vena cava is normal in size with greater than 50% respiratory variability, suggesting right atrial pressure of 3 mmHg. IAS/Shunts: No atrial level shunt detected by color flow Doppler. Additional Comments: A device lead is visualized in the right ventricle.  LEFT VENTRICLE PLAX  2D LVIDd:         3.50 cm     Diastology LVIDs:         2.60 cm     LV e' medial:    7.30 cm/s LV PW:         1.00 cm     LV E/e' medial:  7.7 LV IVS:        0.80 cm     LV e' lateral:   11.30 cm/s LVOT diam:     2.00 cm     LV E/e' lateral: 5.0 LV SV:  42 LV SV Index:   26 LVOT Area:     3.14 cm  LV Volumes (MOD) LV vol d, MOD A2C: 58.9 ml LV vol d, MOD A4C: 47.3 ml LV vol s, MOD A2C: 19.0 ml LV vol s, MOD A4C: 23.2 ml LV SV MOD A2C:     39.9 ml LV SV MOD A4C:     47.3 ml LV SV MOD BP:      33.1 ml RIGHT VENTRICLE RV S prime:     12.70 cm/s LEFT ATRIUM             Index        RIGHT ATRIUM           Index LA diam:        2.90 cm 1.78 cm/m   RA Area:     14.00 cm LA Vol (A2C):   33.2 ml 20.40 ml/m  RA Volume:   35.80 ml  22.00 ml/m LA Vol (A4C):   31.4 ml 19.29 ml/m LA Biplane Vol: 33.1 ml 20.34 ml/m  AORTIC VALVE LVOT Vmax:   99.10 cm/s LVOT Vmean:  59.300 cm/s LVOT VTI:    0.134 m  AORTA Ao Root diam: 2.90 cm Ao Asc diam:  2.90 cm MITRAL VALVE                TRICUSPID VALVE MV Area (PHT): 10.25 cm    TR Peak grad:   24.8 mmHg MV Decel Time: 74 msec      TR Vmax:        249.00 cm/s MV E velocity: 56.00 cm/s MV A velocity: 106.00 cm/s  SHUNTS MV E/A ratio:  0.53         Systemic VTI:  0.13 m                             Systemic Diam: 2.00 cm Dani Gobble Croitoru MD Electronically signed by Sanda Klein MD Signature Date/Time: 02/13/2022/11:32:05 AM    Final    CT Head Wo Contrast  Result Date: 02/12/2022 CLINICAL DATA:  Recent fall while on blood thinners, initial encounter EXAM: CT HEAD WITHOUT CONTRAST CT CERVICAL SPINE WITHOUT CONTRAST TECHNIQUE: Multidetector CT imaging of the head and cervical spine was performed following the standard protocol without intravenous contrast. Multiplanar CT image reconstructions of the cervical spine were also generated. RADIATION DOSE REDUCTION: This exam was performed according to the departmental dose-optimization program which includes automated exposure  control, adjustment of the mA and/or kV according to patient size and/or use of iterative reconstruction technique. COMPARISON:  02/11/2022 FINDINGS: CT HEAD FINDINGS Brain: No evidence of acute infarction, hemorrhage, hydrocephalus, extra-axial collection or mass lesion/mass effect. Mild atrophic changes are noted. Vascular: No hyperdense vessel or unexpected calcification. Skull: Normal. Negative for fracture or focal lesion. Sinuses/Orbits: No acute finding. Other: None. CT CERVICAL SPINE FINDINGS Alignment: Within normal limits. Skull base and vertebrae: 7 cervical segments are well visualized. Vertebral body height is well maintained. Changes of prior fusion at C4-5 and C5-6 are noted with anterior fixation. No acute fracture or acute facet abnormality is noted. Mild facet hypertrophic changes are seen. Mild osteophytic changes are noted most prominent at C6-7. Soft tissues and spinal canal: Surrounding soft tissue structures are within normal limits. Upper chest: Visualized lung apices are unremarkable. Other: None IMPRESSION: CT of the head: No acute intracranial abnormality noted. CT of the cervical spine: Mild degenerative and postoperative changes without acute abnormality.  Electronically Signed   By: Inez Catalina M.D.   On: 02/12/2022 23:48   CT Cervical Spine Wo Contrast  Result Date: 02/12/2022 CLINICAL DATA:  Recent fall while on blood thinners, initial encounter EXAM: CT HEAD WITHOUT CONTRAST CT CERVICAL SPINE WITHOUT CONTRAST TECHNIQUE: Multidetector CT imaging of the head and cervical spine was performed following the standard protocol without intravenous contrast. Multiplanar CT image reconstructions of the cervical spine were also generated. RADIATION DOSE REDUCTION: This exam was performed according to the departmental dose-optimization program which includes automated exposure control, adjustment of the mA and/or kV according to patient size and/or use of iterative reconstruction technique.  COMPARISON:  02/11/2022 FINDINGS: CT HEAD FINDINGS Brain: No evidence of acute infarction, hemorrhage, hydrocephalus, extra-axial collection or mass lesion/mass effect. Mild atrophic changes are noted. Vascular: No hyperdense vessel or unexpected calcification. Skull: Normal. Negative for fracture or focal lesion. Sinuses/Orbits: No acute finding. Other: None. CT CERVICAL SPINE FINDINGS Alignment: Within normal limits. Skull base and vertebrae: 7 cervical segments are well visualized. Vertebral body height is well maintained. Changes of prior fusion at C4-5 and C5-6 are noted with anterior fixation. No acute fracture or acute facet abnormality is noted. Mild facet hypertrophic changes are seen. Mild osteophytic changes are noted most prominent at C6-7. Soft tissues and spinal canal: Surrounding soft tissue structures are within normal limits. Upper chest: Visualized lung apices are unremarkable. Other: None IMPRESSION: CT of the head: No acute intracranial abnormality noted. CT of the cervical spine: Mild degenerative and postoperative changes without acute abnormality. Electronically Signed   By: Inez Catalina M.D.   On: 02/12/2022 23:48    ASSESSMENT & PLAN [name]  is a 66 y.o. female who presents for follow-up for recent DVT and PE, concerning for antiphospholipid syndrome.   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 --Tolerating Lovenox without any prohibitive toxicities including bleeding.  --DOAC therapy is not appropriate in the setting of concern for antiphospholipid antibody syndrome --Hypercoagulable testing from 10/27/2021 showed beta-2 glycoprotein IgM elevated greater than 150 and anticardiolipin antibodies IgM elevated above 103.5. -- Labs today were reviewed and require no intervention. We are waiting for repeat APS antibodies to confirm diagnosis. Once confirmed, we will  discuss with patient and her family  if we need to switch to coumadin as she moves into assisted living facility.  --RTC in 6 months with labs.     No orders of the defined types were placed in this encounter.   All questions were answered. The patient knows to call the clinic with any problems, questions or concerns.  I have spent a total of 25 minutes minutes of face-to-face and non-face-to-face time, preparing to see the patient, performing a medically appropriate examination, counseling and educating the patient,  documenting clinical information in the electronic health record,  and care coordination.   Dede Query, PA-C Department of Hematology/Oncology [place] at [facility] Phone: [phone]

## 2022-03-15 ENCOUNTER — Telehealth: Payer: Self-pay | Admitting: Hematology and Oncology

## 2022-03-15 LAB — BETA-2-GLYCOPROTEIN I ABS, IGG/M/A
Beta-2 Glyco I IgG: <9 GPI IgG units (ref 0–20)
Beta-2-Glycoprotein I IgA: <9 GPI IgA units (ref 0–25)
Beta-2-Glycoprotein I IgM: >150 GPI IgM units — ABNORMAL HIGH (ref 0–32)

## 2022-03-15 LAB — CARDIOLIPIN ANTIBODIES, IGG, IGM, IGA
Anticardiolipin IgA: <9 APL U/mL (ref 0–11)
Anticardiolipin IgG: <9 GPL U/mL (ref 0–14)
Anticardiolipin IgM: 87 MPL U/mL — ABNORMAL HIGH (ref 0–12)

## 2022-03-15 NOTE — Telephone Encounter (Signed)
Called patient to notify of new appointments. Left voicemail with appointment information. Mailing reminder.

## 2022-03-16 ENCOUNTER — Encounter: Payer: Self-pay | Admitting: Physician Assistant

## 2022-03-16 ENCOUNTER — Ambulatory Visit (INDEPENDENT_AMBULATORY_CARE_PROVIDER_SITE_OTHER): Payer: Medicare Other | Admitting: Physician Assistant

## 2022-03-16 DIAGNOSIS — G4709 Other insomnia: Secondary | ICD-10-CM | POA: Diagnosis not present

## 2022-03-16 DIAGNOSIS — F411 Generalized anxiety disorder: Secondary | ICD-10-CM

## 2022-03-16 DIAGNOSIS — G4700 Insomnia, unspecified: Secondary | ICD-10-CM | POA: Diagnosis not present

## 2022-03-16 DIAGNOSIS — F17200 Nicotine dependence, unspecified, uncomplicated: Secondary | ICD-10-CM

## 2022-03-16 MED ORDER — DIVALPROEX SODIUM 250 MG PO DR TAB: 250.0000 mg | DELAYED_RELEASE_TABLET | Freq: Three times a day (TID) | ORAL | 1 refills | Status: DC

## 2022-03-16 MED ORDER — GABAPENTIN 100 MG PO CAPS: ORAL_CAPSULE | ORAL | 1 refills | Status: DC

## 2022-03-16 MED ORDER — RISPERIDONE 3 MG PO TABS: 3.0000 mg | ORAL_TABLET | Freq: Every day | ORAL | 0 refills | Status: DC

## 2022-03-16 NOTE — Progress Notes (Signed)
[facility] Check  Patient ID: [name],  MRN: [id number]  PCP: Lujean Amel, MD  Date of Evaluation: 03/16/2022 Time spent:40 minutes  Chief Complaint:  Chief Complaint   Anxiety; Depression; Follow-up    HISTORY/CURRENT STATUS: HPI for routine med check.   [facility] a month ago, was admitted for  over a week, then went to [facility] for rehab. Pt states 'my blood pressure was too low.'  Review of Discharge summary shows that she probably had a UTI, she was given antibiotics, she had not been taking the Depakote or Lovenox as directed prior to the syncopal episode which led to the admission.  Patient was released from the SNF yesterday.  She saw neurology, Dr. Felecia Shelling, a few weeks ago.  Stated 'he wants you to prescribe Valium at night.'  Says she is still not sleeping well, trouble falling asleep and staying asleep.  Still has anxiety with a panic attack every day.  It can last "for a while."  The gabapentin nor hydroxyzine help.  States she feels generally anxious and uneasy most of the time.  She has had no side effects when starting the gabapentin.   No extreme sadness, tearfulness, or feelings of hopelessness.  ADLs are limited with physical issues.  And personal hygiene is normal.   Denies any changes in concentration or making decisions.  States she does get forgetful at times but it is no worse than it has been.  Appetite has not changed.  Weight is stable.  Not crying easily.  Denies suicidal or homicidal thoughts.  Patient denies increased energy with decreased need for sleep, increased talkativeness, racing thoughts, impulsivity or risky behaviors, increased spending, increased libido, grandiosity, increased irritability or anger, paranoia, or hallucinations.  Review of Systems  Constitutional:  Positive for malaise/fatigue.       After recent hospitalization and rehab  HENT: Negative.    Eyes: Negative.   Respiratory: Negative.    Cardiovascular: Negative.    Gastrointestinal: Negative.   Genitourinary: Negative.   Musculoskeletal: Negative.   Skin: Negative.   Neurological: Negative.   Endo/Heme/Allergies: Negative.   Psychiatric/Behavioral:         See HPI   Individual Medical History/ Review of Systems: Changes? :Yes   see HPI and notes on chart.   Past medications for mental health diagnoses include: Trazodone, Belsomra, Risperdal, Zoloft, hydroxyzine, mirtazapine, Xanax, Valium, Wellbutrin, Abilify, gabapentin, Strattera, Vyvanse, Ambien, Lunesta, Dayvigo didn't help, Latuda, Quviviq, Sonata, Elavil (might have been associated w/ orthostatic hypotension)  Allergies: Penicillins  Current Medications:  Current Outpatient Medications:    donepezil (ARICEPT) 5 MG tablet, Take 5 mg by mouth daily., Disp: , Rfl:    enoxaparin (LOVENOX) 60 MG/0.6ML injection, Inject 0.5 mLs (50 mg total) into the skin 2 (two) times daily., Disp: 30 mL, Rfl: 3   hydrOXYzine (ATARAX) 25 MG tablet, Take 1 tablet (25 mg total) by mouth 2 (two) times daily as needed., Disp: 60 tablet, Rfl: 5   lactulose (CHRONULAC) 10 GM/15ML solution, Take 15 mLs (10 g total) by mouth 2 (two) times daily as needed for mild constipation. Titrate for 2-3 BM daily. Hold if loose stool., Disp: 236 mL, Rfl: 0   midodrine (PROAMATINE) 2.5 MG tablet, Take 1 tablet (2.5 mg total) by mouth 3 (three) times daily with meals., Disp: 90 tablet, Rfl: 0   omeprazole (PRILOSEC) 40 MG capsule, Take 40 mg by mouth daily as needed (acid reflux)., Disp: , Rfl:    divalproex (DEPAKOTE) 250 MG DR  tablet, Take 1 tablet (250 mg total) by mouth in the morning, at noon, and at bedtime., Disp: 90 tablet, Rfl: 1   gabapentin (NEURONTIN) 100 MG capsule, 1 po q am, 2 at lunch, 1 at bedtime., Disp: 120 capsule, Rfl: 1   risperiDONE (RISPERDAL) 3 MG tablet, Take 1 tablet (3 mg total) by mouth at bedtime., Disp: 90 tablet, Rfl: 0 Medication Side Effects: none  Family Medical/ Social History: Changes?  No  MENTAL HEALTH EXAM:  There were no vitals taken for this visit.There is no height or weight on file to calculate BMI.  General Appearance: Casual and Well Groomed  Eye Contact:  Good  Speech:  Clear and Coherent and Normal Rate  Volume:  Normal  Mood:  Irritable  Affect:  Congruent  Thought Process:  Goal Directed and Descriptions of Associations: Circumstantial  Orientation:  Full (Time, Place, and Person)  Thought Content: Illogical and Obsessions   Suicidal Thoughts:  No  Homicidal Thoughts:  No  Memory:   Fair  Judgement:  Poor  Insight:  Lacking  Psychomotor Activity:  Normal  Concentration:  Concentration: Good  Recall:  [place] of Knowledge: Fair  Language: Fair  Assets:  Neurosurgeon  ADL's:  Intact  Cognition: WNL  Prognosis:  Poor   Hospital records and labs from 02/12/2022 to present reviewed.  Was started on midodrine for orthostatic hypotension. Reviewed Dr. Marella Bile note (neuro) from 02/28/2022.  Focused on memory/confusion labs ordered, see results on chart. was started on Aricept.   DIAGNOSES:    ICD-10-CM   1. Generalized anxiety disorder  F41.1 risperiDONE (RISPERDAL) 3 MG tablet    2. Other insomnia  G47.09 risperiDONE (RISPERDAL) 3 MG tablet    3. Insomnia, unspecified type  G47.00 risperiDONE (RISPERDAL) 3 MG tablet    4. Smoker  F17.200      Receiving Psychotherapy: No   RECOMMENDATIONS:  PDMP was reviewed. Last Xanax filled 12/21/2021.  Oxycodone filled 01/03/2022. I provided 40 minutes of face to face time during this encounter, including time spent before and after the visit in records review, medical decision making, counseling pertinent to today's visit, and charting.   She is high risk for falls and confusion and we've had discussed several times in the past few months that I will no longer prescribe a benzodiazepine.  She got upset and said "why are you so against me?"  I again  explained that I am not against her, I am withholding medication that can do more harm than good in her situation.  There are other ways to treat anxiety and insomnia, including increase gabapentin.  Explained the rationale and that any BZ can increase risk of falls and confusion, which she has from other sources and we don't need to add another possible cause. She is not happy with this decision. Reminded her to be compliant with drugs, get a weekly pill box, get her daughter to help with that.  Smoking cessation discussed.  Continue Depakote DR 250 mg, 1 po tid. (for seizure) Continue Aricept 5 mg, 1 p.o. daily.  Per neurology. Increase gabapentin 100 mg, as follows, 1 p.o. every morning, 2 p.o. around lunchtime, 1 p.o. q. evening. Continue hydroxyzine 25 mg, 1 p.o. 2 times daily as needed anxiety. Continue Risperdal 3 mg, 1 p.o. nightly. Recommend multivitamin, B complex, vitamin D, fish oil. Strongly recommend counseling. Return in 4 weeks.  Donnal Moat, PA-C

## 2022-03-20 ENCOUNTER — Telehealth: Payer: Self-pay

## 2022-03-20 NOTE — Telephone Encounter (Signed)
Spoke with pt and her daughter, [name].  Pt will be admitted to a nursing facility soon and they do not give Lovenox injections.Pt will need to be switched to coumadin and will need to have someone monitor her INR levels.  Pt stated her PCP has monitored them in the past and she will have them to monitor them once med have been switched over.

## 2022-03-21 NOTE — Progress Notes (Signed)
Remote pacemaker transmission.   

## 2022-03-22 ENCOUNTER — Telehealth: Payer: Self-pay | Admitting: Neurology

## 2022-03-22 ENCOUNTER — Other Ambulatory Visit: Payer: Self-pay | Admitting: Physician Assistant

## 2022-03-22 ENCOUNTER — Telehealth: Payer: Self-pay | Admitting: Physician Assistant

## 2022-03-22 NOTE — Telephone Encounter (Signed)
[name] called at 10:05 to request refill of amitriptyline.  She was prescribed this when she was in rehab and is almost out.  She said you had prescribed this in the past and she told you she wasn't taking it anymore.  She didn't realize this was one of the medications given to her from rehab.  She does need it refilled.  Appt 04/27/22.  [facility] [address]

## 2022-03-22 NOTE — Telephone Encounter (Signed)
Pt called wanting to know if the provider can call in an Rx for the volume that was mentioned to her in her last visit to help with sleep. Pt would [facility] to [facility] [address]

## 2022-03-22 NOTE — Telephone Encounter (Signed)
Called pt. States at last office visit with Dr. Felecia Shelling 02/28/22, he mentioned to her daughter that valium would be a good option at bedtime to take d/t her getting up in the middle of night, waking people up, telling them to go to work, etc. I reviewed note and did not see mention of this. Aware I send to Dr. Felecia Shelling review once back in the office next week. I will call back once I hear back from him. She verbalized understanding.

## 2022-03-22 NOTE — Telephone Encounter (Signed)
Please review

## 2022-03-23 ENCOUNTER — Other Ambulatory Visit: Payer: Self-pay | Admitting: Physician Assistant

## 2022-03-23 DIAGNOSIS — I8290 Acute embolism and thrombosis of unspecified vein: Secondary | ICD-10-CM | POA: Diagnosis not present

## 2022-03-23 DIAGNOSIS — R413 Other amnesia: Secondary | ICD-10-CM | POA: Diagnosis not present

## 2022-03-23 DIAGNOSIS — Z7901 Long term (current) use of anticoagulants: Secondary | ICD-10-CM | POA: Diagnosis not present

## 2022-03-23 DIAGNOSIS — D6859 Other primary thrombophilia: Secondary | ICD-10-CM | POA: Diagnosis not present

## 2022-03-23 MED ORDER — AMITRIPTYLINE HCL 25 MG PO TABS: 50.0000 mg | ORAL_TABLET | Freq: Every day | ORAL | 0 refills | Status: DC

## 2022-03-23 NOTE — Telephone Encounter (Signed)
I sent in 5 days worth. Once I hear from cardiology we'll let her know what they say.

## 2022-03-23 NOTE — Telephone Encounter (Signed)
Please let her know I've contacted cardiologist about whether she is supposed to be on this, but haven't heard back from them yet. She reported being off and I don't want to restart it if not appropriate. If she has been taking it, have her take only 1 pill (25 mg) until I can discuss w/ them. Thanks.

## 2022-03-23 NOTE — Telephone Encounter (Signed)
Pt stated she has been on it for a month.She has 2 pills left and wants to know would you be able to send it in for her so she does not have to stop med

## 2022-03-23 NOTE — Telephone Encounter (Signed)
LVM to rtc 

## 2022-03-28 DIAGNOSIS — K5909 Other constipation: Secondary | ICD-10-CM | POA: Diagnosis not present

## 2022-03-29 ENCOUNTER — Telehealth: Payer: Self-pay | Admitting: Neurology

## 2022-03-29 ENCOUNTER — Other Ambulatory Visit: Payer: Self-pay | Admitting: Neurology

## 2022-03-29 DIAGNOSIS — D6861 Antiphospholipid syndrome: Secondary | ICD-10-CM

## 2022-03-29 MED ORDER — DIAZEPAM 5 MG PO TABS: 5.0000 mg | ORAL_TABLET | Freq: Every evening | ORAL | 2 refills | Status: DC | PRN

## 2022-03-29 NOTE — Telephone Encounter (Signed)
I called twice and got voicemail.  I left a message and will try to reach her again later today.  I will go ahead and send in a prescription for Valium to take 1 pill at bedtime.  Hopefully this will help her sleep.  She had lab work with cardiology.  Labs are consistent with antiphospholipid syndrome.  I was going to ask her if she has seen rheumatology in the past for an evaluation of lupus.  Antiphospholipid syndrome can be associated with cognitive impairment, whether or not other signs of lupus are present.  Therefore, I would like her to have an evaluation with rheumatology for consideration of hydroxychloroquine or other treatments

## 2022-03-29 NOTE — Telephone Encounter (Signed)
Pt is calling back to f/u on RN speaking with Dr Felecia Shelling on the suggestion made to daughter of pt taking valium at night to help her sleep.

## 2022-03-29 NOTE — Telephone Encounter (Signed)
I spoke to [name] and let her know about the antiphospholipid abnormality.  It is possible that that is playing a role in her cognitive and behavioral changes as many patients with antiphospholipid syndrome have neurocognitive issues.  Sometimes anticoagulants can help (and definitely will help to reduce chance of clots).   Uncertain whether her antiphospholipid syndrome is primary or secondary.  I would like her to see rheumatology to assess for lupus as that diagnosis may guide treatment.  If neurocognitive symptoms do not improve after she is on anticoagulation, might empirically consider immunomodulatory therapy.

## 2022-04-03 ENCOUNTER — Telehealth: Payer: Self-pay | Admitting: Neurology

## 2022-04-03 NOTE — Telephone Encounter (Signed)
Referral sent to [facility], phone # [phone]. 

## 2022-04-04 NOTE — Telephone Encounter (Signed)
I spoke with Dr [name] and he saw the pt on 03/28/22.  Per Pt and her daughter, she will not be going to an Assisted Living and so she will not need to be switched to Coumadin.  Per Dr [name] he does not recommend pt taking coumadin.  Confirmed this with pt and advised her we will not be switching her back to coumadin.  She was in agreement with this plan.

## 2022-04-06 ENCOUNTER — Other Ambulatory Visit: Payer: Self-pay | Admitting: Neurology

## 2022-04-10 DIAGNOSIS — D6859 Other primary thrombophilia: Secondary | ICD-10-CM | POA: Diagnosis not present

## 2022-04-16 ENCOUNTER — Telehealth: Payer: Self-pay | Admitting: Neurology

## 2022-04-16 ENCOUNTER — Emergency Department (HOSPITAL_BASED_OUTPATIENT_CLINIC_OR_DEPARTMENT_OTHER): Payer: 59

## 2022-04-16 ENCOUNTER — Emergency Department (HOSPITAL_BASED_OUTPATIENT_CLINIC_OR_DEPARTMENT_OTHER)
Admission: EM | Admit: 2022-04-16 | Discharge: 2022-04-16 | Disposition: A | Discharge status: Home / Self Care | Payer: 59 | Attending: Emergency Medicine | Admitting: Emergency Medicine

## 2022-04-16 ENCOUNTER — Other Ambulatory Visit: Payer: Self-pay

## 2022-04-16 ENCOUNTER — Encounter (HOSPITAL_BASED_OUTPATIENT_CLINIC_OR_DEPARTMENT_OTHER): Payer: Self-pay

## 2022-04-16 DIAGNOSIS — M8588 Other specified disorders of bone density and structure, other site: Secondary | ICD-10-CM | POA: Diagnosis not present

## 2022-04-16 DIAGNOSIS — M5442 Lumbago with sciatica, left side: Secondary | ICD-10-CM | POA: Diagnosis not present

## 2022-04-16 DIAGNOSIS — M5441 Lumbago with sciatica, right side: Secondary | ICD-10-CM

## 2022-04-16 DIAGNOSIS — M545 Low back pain, unspecified: Secondary | ICD-10-CM | POA: Diagnosis not present

## 2022-04-16 MED ORDER — OXYCODONE HCL 5 MG PO CAPS: 10.0000 mg | ORAL_CAPSULE | ORAL | 0 refills | Status: DC | PRN

## 2022-04-16 MED ORDER — METHYLPREDNISOLONE 4 MG PO TBPK: ORAL_TABLET | ORAL | 0 refills | Status: DC

## 2022-04-16 NOTE — Telephone Encounter (Signed)
Called and spoke with pt. Relayed Dr. Garth Bigness message. She is not taking amitriptyline anymore. I d/c'd this off her list. She will continue current meds and undertstands MD not comfortable adding any additional ones right now.   She called Rheumatology back and waiting to schedule appt. She will let us know once she has appt set up.

## 2022-04-16 NOTE — Discharge Instructions (Addendum)
Your history, exam, evaluation today are consistent with bilateral low back pain with sciatica recurrence.  The CT scan did not show acute fracture but showed your previous surgical area and evidence of the degenerative changes and disease.  Your story did not seem consistent with infection or acute traumatic injury.  We agree that given lack of numbness or weakness or urinary changes to hold on transfer for MRI however as you have had relief with steroids and some pain medicine, we feel comfortable giving you prescription for this to then follow-up with your back doctor this week.  Please rest and stay hydrated.  Please be careful not to fall.  If any symptoms change or worsen acutely, please return to the nearest emergency department.

## 2022-04-16 NOTE — Telephone Encounter (Signed)
Pt is calling. Stated diazepam (VALIUM) 5 MG tablet is not working. Stated she is up at 3 am in the morning doing things she don't normally do. Pt is requesting a call back from nurse.

## 2022-04-16 NOTE — ED Provider Notes (Signed)
[facility] [facility] Provider Note   CSN: [id number] Arrival date & time: 04/16/22  1656     History  Chief Complaint  Patient presents with   Leg Pain    BEATRYCE COLOMBO is a 67 y.o. female.  The history is provided by the patient and medical records. No language interpreter was used.  Back Pain Location:  Lumbar spine and sacro-iliac joint Quality:  Aching, shooting and stabbing Radiates to:  R posterior upper leg and L posterior upper leg Pain severity:  Severe Pain is:  Same all the time Onset quality:  Gradual Duration:  3 days Timing:  Constant Progression:  Waxing and waning Chronicity:  Recurrent Context: not recent injury   Relieved by:  Nothing Worsened by:  Palpation and movement Ineffective treatments:  None tried Associated symptoms: no abdominal pain, no bladder incontinence, no bowel incontinence, no chest pain, no dysuria, no fever, no headaches, no leg pain, no numbness, no paresthesias, no pelvic pain, no tingling and no weakness        Home Medications Prior to Admission medications   Medication Sig Start Date End Date Taking? Authorizing Provider  diazepam (VALIUM) 5 MG tablet Take 1 tablet (5 mg total) by mouth at bedtime as needed for anxiety. 03/29/22   Sater, Nanine Means, MD  divalproex (DEPAKOTE) 250 MG DR tablet Take 1 tablet (250 mg total) by mouth in the morning, at noon, and at bedtime. 03/16/22   Donnal Moat T, PA-C  donepezil (ARICEPT) 5 MG tablet Take 5 mg by mouth daily. 03/01/22   [provider]  enoxaparin (LOVENOX) 60 MG/0.6ML injection Inject 0.5 mLs (50 mg total) into the skin 2 (two) times daily. 12/20/21 04/19/22  Dede Query T, PA-C  gabapentin (NEURONTIN) 100 MG capsule 1 po q am, 2 at lunch, 1 at bedtime. 03/16/22   Addison Lank, PA-C  hydrOXYzine (ATARAX) 25 MG tablet Take 1 tablet (25 mg total) by mouth 2 (two) times daily as needed. 03/05/22   Sater, Nanine Means, MD  lactulose  (CHRONULAC) 10 GM/15ML solution Take 15 mLs (10 g total) by mouth 2 (two) times daily as needed for mild constipation. Titrate for 2-3 BM daily. Hold if loose stool. 11/25/21   Antonieta Pert, MD  midodrine (PROAMATINE) 2.5 MG tablet Take 1 tablet (2.5 mg total) by mouth 3 (three) times daily with meals. 02/23/22   Shawna Clamp, MD  omeprazole (PRILOSEC) 40 MG capsule Take 40 mg by mouth daily as needed (acid reflux).    [provider]  risperiDONE (RISPERDAL) 3 MG tablet Take 1 tablet (3 mg total) by mouth at bedtime. 03/16/22   Addison Lank, PA-C      Allergies    Penicillins    Review of Systems   Review of Systems  Constitutional:  Negative for chills, fatigue and fever.  HENT:  Negative for congestion.   Respiratory:  Negative for cough, chest tightness, shortness of breath and wheezing.   Cardiovascular:  Negative for chest pain, palpitations and leg swelling.  Gastrointestinal:  Negative for abdominal distention, abdominal pain, bowel incontinence, constipation, diarrhea, nausea and vomiting.  Genitourinary:  Negative for bladder incontinence, dysuria, flank pain, frequency and pelvic pain.  Musculoskeletal:  Positive for back pain. Negative for neck pain and neck stiffness.  Skin:  Negative for rash and wound.  Neurological:  Negative for dizziness, tingling, weakness, light-headedness, numbness, headaches and paresthesias.  Psychiatric/Behavioral:  Negative for agitation and confusion.  All other systems reviewed and are negative.   Physical Exam Updated Vital Signs BP 113/80 (BP Location: Right Arm)   Pulse 87   Temp 98.4 F (36.9 C)   Resp 18   SpO2 100%  Physical Exam Vitals and nursing note reviewed.  Constitutional:      General: She is not in acute distress.    Appearance: She is well-developed. She is not ill-appearing, toxic-appearing or diaphoretic.  HENT:     Head: Normocephalic and atraumatic.     Nose: Nose normal.     Mouth/Throat:     Mouth:  Mucous membranes are moist.  Eyes:     Extraocular Movements: Extraocular movements intact.     Conjunctiva/sclera: Conjunctivae normal.     Pupils: Pupils are equal, round, and reactive to light.  Cardiovascular:     Rate and Rhythm: Normal rate and regular rhythm.     Heart sounds: No murmur heard. Pulmonary:     Effort: Pulmonary effort is normal. No respiratory distress.     Breath sounds: Normal breath sounds. No wheezing, rhonchi or rales.  Chest:     Chest wall: No tenderness.  Abdominal:     General: Abdomen is flat.     Palpations: Abdomen is soft.     Tenderness: There is no abdominal tenderness. There is no guarding or rebound.  Musculoskeletal:        General: Tenderness present. No swelling.     Cervical back: Neck supple.     Lumbar back: Tenderness present. No signs of trauma or lacerations. Negative right straight leg raise test and negative left straight leg raise test.       Back:     Right lower leg: No edema.     Left lower leg: No edema.  Skin:    General: Skin is warm and dry.     Capillary Refill: Capillary refill takes less than 2 seconds.     Findings: No erythema or rash.  Neurological:     General: No focal deficit present.     Mental Status: She is alert.     Sensory: No sensory deficit.     Motor: No weakness.  Psychiatric:        Mood and Affect: Mood normal.     ED Results / Procedures / Treatments   Labs (all labs ordered are listed, but only abnormal results are displayed) Labs Reviewed - No data to display  EKG None  Radiology CT Lumbar Spine Wo Contrast  Result Date: 04/16/2022 CLINICAL DATA:  Low back pain with radiation down both legs EXAM: CT LUMBAR SPINE WITHOUT CONTRAST TECHNIQUE: Multidetector CT imaging of the lumbar spine was performed without intravenous contrast administration. Multiplanar CT image reconstructions were also generated. RADIATION DOSE REDUCTION: This exam was performed according to the departmental  dose-optimization program which includes automated exposure control, adjustment of the mA and/or kV according to patient size and/or use of iterative reconstruction technique. COMPARISON:  CT lumbar spine 02/25/2020, CT abdomen pelvis 02/04/2022 FINDINGS: Segmentation: 5 lumbar type vertebral bodies. Alignment: Trace, fused anterolisthesis at L4-L5. Mild dextrocurvature. Vertebrae: No acute fracture or suspicious osseous lesion. Osteopenia. Status post posterior fusion and decompression at L4-L5, with interbody disc spacer, which appears unchanged compared to prior imaging. Paraspinal and other soft tissues: Aortic atherosclerosis. No lymphadenopathy. Disc levels: T12-L1: No significant disc bulge. No spinal canal stenosis or neural foraminal narrowing. L1-L2: No significant disc bulge. No spinal canal stenosis or neural foraminal narrowing. L2-L3: Minimal disc bulge.  No spinal canal stenosis. Mild bilateral neural foraminal narrowing, unchanged. L3-L4: Mild disc bulge. Mild-to-moderate facet arthropathy, left greater than right. No spinal canal stenosis. Mild bilateral neural foraminal narrowing, which appears to progressed from 2019. L4-L5: Status post fusion and decompression. No spinal canal stenosis. Mild left neural foraminal narrowing, which has progressed slightly from the prior exam. L5-S1: Mild disc bulge. Moderate right and mild left facet arthropathy. No spinal canal stenosis. Mild bilateral neural foraminal narrowing. IMPRESSION: 1. Mild bilateral neural foraminal narrowing at L2-L3, L3-L4, and L4-L5. 2. No spinal canal stenosis. 3. Status post posterior fusion and decompression at L4-L5, with interbody disc spacer, which appears unchanged compared to prior imaging. 4. Aortic atherosclerosis. Aortic Atherosclerosis (ICD10-I70.0). Electronically Signed   By: Merilyn Baba M.D.   On: 04/16/2022 19:33    Procedures Procedures    Medications Ordered in ED Medications - No data to display  ED  Course/ Medical Decision Making/ A&P                             Medical Decision Making   NATALIYA GRAIG is a 67 y.o. female with a past medical history significant for vertigo, cervical cancer, anxiety, depression, previous pacemaker, and previous back injury status post surgery who presents with recurrent severe low back pain.  According to patient, for the last 3 days she has had severe 10 out of 10 pain in her low back that goes down her bilateral legs.  She reports a history of sciatic pain but denies any new trauma.  She reports that it is inhibiting her doing anything or sleeping due to the pain.  She denies any numbness or weakness and denies loss of bowel or bladder control.  She reports she normally sees her doctor and gets a steroid prescription and some pain medicine and it will settle down.  She has not had the symptoms she reports quite some time.  She denies any fevers, chills, chest pain, shortness of breath, nausea, vomiting, constipation, or diarrhea.  Denies any urinary changes with no hematuria or dysuria.  Denies any abdominal symptoms.  Denies any focal leg pains however.  On exam, lungs clear and chest nontender.  Abdomen nontender.  Patient had intact sensation and strength and pulses in lower extremities.  Low back was tender diffusely.  Mid and upper back nontender.  Neck nontender.  No focal neurologic deficits initially.  Patient otherwise well-appearing.  Due to severe pain and previous surgery, CT was ordered in triage and did not show evidence of acute fracture.  It showed no disease and previous surgical area.  No convincing evidence of infection in the story and no other concerning findings on imaging.  Given her lack of red flags in regards to numbness weakness or urinary changes we agreed to hold on transfer for MRI at this time.  Do not feel she needs labs at this time and she is not any urinary symptoms to suggest pyelonephritis or a renal cause of  symptoms.  Patient is requesting some pain medicine prescription and steroids.  Due to the amount of pain she has had, we will give prescription for the oxycodone and a Medrol Dosepak.  She will follow-up with her PCP and back doctor in the neck several days.  She agrees with plan of care and had no other questions or concerns.  Patient discharged in good condition with understanding of return precautions.  Final Clinical Impression(s) / ED Diagnoses Final diagnoses:  Acute bilateral low back pain with bilateral sciatica    Rx / DC Orders ED Discharge Orders          Ordered    methylPREDNISolone (MEDROL DOSEPAK) 4 MG TBPK tablet        04/16/22 2201    oxycodone (OXY-IR) 5 MG capsule  Every 4 hours PRN        04/16/22 2201            Clinical Impression: 1. Acute bilateral low back pain with bilateral sciatica     Disposition: Discharge  Condition: Good  I have discussed the results, Dx and Tx plan with the pt(& family if present). He/she/they expressed understanding and agree(s) with the plan. Discharge instructions discussed at great length. Strict return precautions discussed and pt &/or family have verbalized understanding of the instructions. No further questions at time of discharge.    New Prescriptions   METHYLPREDNISOLONE (MEDROL DOSEPAK) 4 MG TBPK TABLET    Please follow instructions per dosepak   OXYCODONE (OXY-IR) 5 MG CAPSULE    Take 2 capsules (10 mg total) by mouth every 4 (four) hours as needed.    Follow Up: your back doctor and PCP     Lujean Amel, MD [address] [place] [address] [phone]         Dayson Aboud, Gwenyth Allegra, MD 04/16/22 2215

## 2022-04-16 NOTE — ED Provider Triage Note (Signed)
Emergency Medicine Provider Triage Evaluation Note  [name] , a 67 y.o. female  was evaluated in triage.  Pt complains of lower back pain with radiation down both of her legs. No red flag symptoms. No trauma or falls. Reports that she has sciatica but this feels worse.  Review of Systems  Positive:  Negative:   Physical Exam  BP 97/70   Pulse (!) 104   Temp 98.4 F (36.9 C)   Resp 17   SpO2 97%  Gen:   Awake, no distress   Resp:  Normal effort  MSK:   Moves extremities without difficulty  Other:  Diffuse lower back tendenress. Palpable pulses bilterally. Decreased strength in bilateral lower legs, but symmetric. Compartments soft. Sensation reportedly intact.   Medical Decision Making  Medically screening exam initiated at 5:35 PM.  Appropriate orders placed.  [name] was informed that the remainder of the evaluation will be completed by another provider, this initial triage assessment does not replace that evaluation, and the importance of remaining in the ED until their evaluation is complete.  CT ordered. Patient is always tachycardic.    Tabitha Puller, PA-C 04/16/22 1736

## 2022-04-16 NOTE — ED Triage Notes (Signed)
Pt states "I can't walk- it's killing me, they're on fire, they feel like they're mush." Pt states pain started 3 days ago. Denies injury, CNS intact bilaterally  Hx pacemaker, "blood clots," compliant w AC.  Pt ambulatory to triage

## 2022-04-26 DIAGNOSIS — I2699 Other pulmonary embolism without acute cor pulmonale: Secondary | ICD-10-CM | POA: Diagnosis not present

## 2022-04-26 DIAGNOSIS — I951 Orthostatic hypotension: Secondary | ICD-10-CM | POA: Diagnosis not present

## 2022-04-26 DIAGNOSIS — R809 Proteinuria, unspecified: Secondary | ICD-10-CM | POA: Diagnosis not present

## 2022-04-26 DIAGNOSIS — K219 Gastro-esophageal reflux disease without esophagitis: Secondary | ICD-10-CM | POA: Diagnosis not present

## 2022-04-26 DIAGNOSIS — G08 Intracranial and intraspinal phlebitis and thrombophlebitis: Secondary | ICD-10-CM | POA: Diagnosis not present

## 2022-04-26 DIAGNOSIS — N189 Chronic kidney disease, unspecified: Secondary | ICD-10-CM | POA: Diagnosis not present

## 2022-04-27 ENCOUNTER — Encounter: Payer: Self-pay | Admitting: Physician Assistant

## 2022-04-27 ENCOUNTER — Ambulatory Visit (INDEPENDENT_AMBULATORY_CARE_PROVIDER_SITE_OTHER): Payer: 59 | Admitting: Physician Assistant

## 2022-04-27 DIAGNOSIS — F410 Panic disorder [episodic paroxysmal anxiety] without agoraphobia: Secondary | ICD-10-CM

## 2022-04-27 DIAGNOSIS — F17200 Nicotine dependence, unspecified, uncomplicated: Secondary | ICD-10-CM

## 2022-04-27 DIAGNOSIS — F319 Bipolar disorder, unspecified: Secondary | ICD-10-CM

## 2022-04-27 DIAGNOSIS — G4700 Insomnia, unspecified: Secondary | ICD-10-CM

## 2022-04-27 DIAGNOSIS — Z79899 Other long term (current) drug therapy: Secondary | ICD-10-CM

## 2022-04-27 DIAGNOSIS — F411 Generalized anxiety disorder: Secondary | ICD-10-CM

## 2022-04-27 LAB — LAB REPORT - SCANNED
A1c: 5.6
EGFR: 75
Microalbumin, Urine: 617.3

## 2022-04-27 MED ORDER — GABAPENTIN 300 MG PO CAPS: 300.0000 mg | ORAL_CAPSULE | Freq: Two times a day (BID) | ORAL | 1 refills | Status: DC

## 2022-04-27 NOTE — Progress Notes (Unsigned)
[facility] Med Check  Patient ID: [name],  MRN: [id number]  PCP: Lujean Amel, MD  Date of Evaluation: 04/27/2022 Time spent:40 minutes  Chief Complaint:    HISTORY/CURRENT STATUS: HPI for routine med check.      Individual Medical History/ Review of Systems: Changes? :Yes   see HPI and notes on chart.   Past medications for mental health diagnoses include: Trazodone, Belsomra, Risperdal, Zoloft, hydroxyzine, mirtazapine, Xanax, Valium, Wellbutrin, Abilify, gabapentin, Strattera, Vyvanse, Ambien, Lunesta, Dayvigo didn't help, Latuda, Quviviq, Sonata, Elavil (might have been associated w/ orthostatic hypotension)  Allergies: Penicillins  Current Medications:  Current Outpatient Medications:    diazepam (VALIUM) 5 MG tablet, Take 1 tablet (5 mg total) by mouth at bedtime as needed for anxiety., Disp: 30 tablet, Rfl: 2   divalproex (DEPAKOTE) 250 MG DR tablet, Take 1 tablet (250 mg total) by mouth in the morning, at noon, and at bedtime., Disp: 90 tablet, Rfl: 1   donepezil (ARICEPT) 5 MG tablet, Take 5 mg by mouth daily., Disp: , Rfl:    enoxaparin (LOVENOX) 60 MG/0.6ML injection, Inject 0.5 mLs (50 mg total) into the skin 2 (two) times daily., Disp: 30 mL, Rfl: 3   gabapentin (NEURONTIN) 100 MG capsule, 1 po q am, 2 at lunch, 1 at bedtime., Disp: 120 capsule, Rfl: 1   hydrOXYzine (ATARAX) 25 MG tablet, Take 1 tablet (25 mg total) by mouth 2 (two) times daily as needed., Disp: 60 tablet, Rfl: 5   lactulose (CHRONULAC) 10 GM/15ML solution, Take 15 mLs (10 g total) by mouth 2 (two) times daily as needed for mild constipation. Titrate for 2-3 BM daily. Hold if loose stool., Disp: 236 mL, Rfl: 0   methylPREDNISolone (MEDROL DOSEPAK) 4 MG TBPK tablet, Please follow instructions per dosepak, Disp: 21 each, Rfl: 0   midodrine (PROAMATINE) 2.5 MG tablet, Take 1 tablet (2.5 mg total) by mouth 3 (three) times daily with meals., Disp: 90 tablet, Rfl: 0   omeprazole (PRILOSEC) 40  MG capsule, Take 40 mg by mouth daily as needed (acid reflux)., Disp: , Rfl:    oxycodone (OXY-IR) 5 MG capsule, Take 2 capsules (10 mg total) by mouth every 4 (four) hours as needed., Disp: 20 capsule, Rfl: 0   risperiDONE (RISPERDAL) 3 MG tablet, Take 1 tablet (3 mg total) by mouth at bedtime., Disp: 90 tablet, Rfl: 0 Medication Side Effects: none  Family Medical/ Social History: Changes? No  MENTAL HEALTH EXAM:  There were no vitals taken for this visit.There is no height or weight on file to calculate BMI.  General Appearance: Casual and Well Groomed  Eye Contact:  Good  Speech:  Clear and Coherent and Normal Rate  Volume:  Normal  Mood:  Irritable  Affect:  Congruent  Thought Process:  Goal Directed and Descriptions of Associations: Circumstantial  Orientation:  Full (Time, Place, and Person)  Thought Content: Illogical and Obsessions   Suicidal Thoughts:  No  Homicidal Thoughts:  No  Memory:   Fair  Judgement:  Poor  Insight:  Lacking  Psychomotor Activity:  Normal  Concentration:  Concentration: Good  Recall:  [place] of Knowledge: Fair  Language: Fair  Assets:  Neurosurgeon  ADL's:  Intact  Cognition: WNL  Prognosis:  Poor   Hospital records and labs from 02/12/2022 to present reviewed.  Was started on midodrine for orthostatic hypotension. Reviewed Dr. Marella Bile note (neuro) from 02/28/2022.  Focused on memory/confusion labs ordered, see results on chart. was  started on Aricept.   DIAGNOSES:  No diagnosis found.  Receiving Psychotherapy: No   RECOMMENDATIONS:  PDMP was reviewed. Last Xanax filled 12/21/2021.  Oxycodone filled 01/03/2022.    Smoking cessation discussed.  Continue Depakote DR 250 mg, 1 po tid. (for seizure) Continue Aricept 5 mg, 1 p.o. daily.  Per neurology. Increase gabapentin Continue hydroxyzine 25 mg, 1 p.o. 2 times daily as needed anxiety. Continue Risperdal 3 mg, 1 p.o.  nightly. Recommend multivitamin, B complex, vitamin D, fish oil. Strongly recommend counseling. Return in 4 weeks.  Donnal Moat, PA-C

## 2022-04-30 ENCOUNTER — Other Ambulatory Visit: Payer: Self-pay

## 2022-04-30 ENCOUNTER — Emergency Department (HOSPITAL_COMMUNITY): Payer: 59

## 2022-04-30 ENCOUNTER — Encounter (HOSPITAL_COMMUNITY): Payer: Self-pay | Admitting: Emergency Medicine

## 2022-04-30 ENCOUNTER — Emergency Department (HOSPITAL_COMMUNITY)
Admission: EM | Admit: 2022-04-30 | Discharge: 2022-05-01 | Disposition: A | Discharge status: Home / Self Care | Payer: 59 | Attending: Emergency Medicine | Admitting: Emergency Medicine

## 2022-04-30 DIAGNOSIS — Z743 Need for continuous supervision: Secondary | ICD-10-CM | POA: Diagnosis not present

## 2022-04-30 DIAGNOSIS — Z86711 Personal history of pulmonary embolism: Secondary | ICD-10-CM | POA: Insufficient documentation

## 2022-04-30 DIAGNOSIS — R2681 Unsteadiness on feet: Secondary | ICD-10-CM | POA: Insufficient documentation

## 2022-04-30 DIAGNOSIS — R410 Disorientation, unspecified: Secondary | ICD-10-CM | POA: Diagnosis not present

## 2022-04-30 DIAGNOSIS — Z7901 Long term (current) use of anticoagulants: Secondary | ICD-10-CM | POA: Diagnosis not present

## 2022-04-30 DIAGNOSIS — Z79899 Other long term (current) drug therapy: Secondary | ICD-10-CM | POA: Diagnosis not present

## 2022-04-30 DIAGNOSIS — R2689 Other abnormalities of gait and mobility: Secondary | ICD-10-CM | POA: Insufficient documentation

## 2022-04-30 DIAGNOSIS — F319 Bipolar disorder, unspecified: Secondary | ICD-10-CM | POA: Insufficient documentation

## 2022-04-30 DIAGNOSIS — F17210 Nicotine dependence, cigarettes, uncomplicated: Secondary | ICD-10-CM | POA: Insufficient documentation

## 2022-04-30 DIAGNOSIS — G252 Other specified forms of tremor: Secondary | ICD-10-CM | POA: Diagnosis not present

## 2022-04-30 DIAGNOSIS — R4702 Dysphasia: Secondary | ICD-10-CM | POA: Diagnosis not present

## 2022-04-30 DIAGNOSIS — R9431 Abnormal electrocardiogram [ECG] [EKG]: Secondary | ICD-10-CM | POA: Diagnosis not present

## 2022-04-30 DIAGNOSIS — I6522 Occlusion and stenosis of left carotid artery: Secondary | ICD-10-CM | POA: Diagnosis not present

## 2022-04-30 LAB — CBC
HCT: 36.4 % (ref 36.0–46.0)
Hemoglobin: 12.2 g/dL (ref 12.0–15.0)
MCH: 30.9 pg (ref 26.0–34.0)
MCHC: 33.5 g/dL (ref 30.0–36.0)
MCV: 92.2 fL (ref 80.0–100.0)
Platelets: 196 10*3/uL (ref 150–400)
RBC: 3.95 MIL/uL (ref 3.87–5.11)
RDW: 15.7 % — ABNORMAL HIGH (ref 11.5–15.5)
WBC: 6.6 10*3/uL (ref 4.0–10.5)
nRBC: 0 % (ref 0.0–0.2)

## 2022-04-30 LAB — DIFFERENTIAL
Abs Immature Granulocytes: 0.02 10*3/uL (ref 0.00–0.07)
Basophils Absolute: 0.1 10*3/uL (ref 0.0–0.1)
Basophils Relative: 1 %
Eosinophils Absolute: 0.1 10*3/uL (ref 0.0–0.5)
Eosinophils Relative: 1 %
Immature Granulocytes: 0 %
Lymphocytes Relative: 42 %
Lymphs Abs: 2.8 10*3/uL (ref 0.7–4.0)
Monocytes Absolute: 0.6 10*3/uL (ref 0.1–1.0)
Monocytes Relative: 10 %
Neutro Abs: 3 10*3/uL (ref 1.7–7.7)
Neutrophils Relative %: 46 %

## 2022-04-30 LAB — RAPID URINE DRUG SCREEN, HOSP PERFORMED
Amphetamines: NOT DETECTED
Barbiturates: NOT DETECTED
Benzodiazepines: POSITIVE — AB
Cocaine: NOT DETECTED
Opiates: NOT DETECTED
Tetrahydrocannabinol: NOT DETECTED

## 2022-04-30 LAB — COMPREHENSIVE METABOLIC PANEL
ALT: 6 U/L (ref 0–44)
AST: 11 U/L — ABNORMAL LOW (ref 15–41)
Albumin: 3.6 g/dL (ref 3.5–5.0)
Alkaline Phosphatase: 39 U/L (ref 38–126)
Anion gap: 11 (ref 5–15)
BUN: 13 mg/dL (ref 8–23)
CO2: 22 mmol/L (ref 22–32)
Calcium: 9 mg/dL (ref 8.9–10.3)
Chloride: 108 mmol/L (ref 98–111)
Creatinine, Ser: 0.91 mg/dL (ref 0.44–1.00)
GFR, Estimated: >60 mL/min (ref >60)
Glucose, Bld: 92 mg/dL (ref 70–99)
Potassium: 3.8 mmol/L (ref 3.5–5.1)
Sodium: 141 mmol/L (ref 135–145)
Total Bilirubin: 0.6 mg/dL (ref 0.3–1.2)
Total Protein: 5.5 g/dL — ABNORMAL LOW (ref 6.5–8.1)

## 2022-04-30 LAB — AMMONIA: Ammonia: 20 umol/L (ref 9–35)

## 2022-04-30 LAB — URINALYSIS, ROUTINE W REFLEX MICROSCOPIC
Bacteria, UA: NONE SEEN
Bilirubin Urine: NEGATIVE
Glucose, UA: NEGATIVE mg/dL
Hgb urine dipstick: NEGATIVE
Ketones, ur: 5 mg/dL — AB
Leukocytes,Ua: NEGATIVE
Nitrite: NEGATIVE
Protein, ur: 30 mg/dL — AB
Specific Gravity, Urine: 1.044 — ABNORMAL HIGH (ref 1.005–1.030)
pH: 6 (ref 5.0–8.0)

## 2022-04-30 LAB — PROTIME-INR
INR: 1.1 (ref 0.8–1.2)
Prothrombin Time: 14.5 seconds (ref 11.4–15.2)

## 2022-04-30 LAB — ETHANOL: Alcohol, Ethyl (B): <10 mg/dL (ref <10)

## 2022-04-30 LAB — APTT: aPTT: 59 seconds — ABNORMAL HIGH (ref 24–36)

## 2022-04-30 MED ORDER — SODIUM CHLORIDE 0.9 % IV SOLN
100.0000 mL/h | INTRAVENOUS | Status: DC
  Administered 2022-04-30 (×2): 100 mL/h via INTRAVENOUS

## 2022-04-30 MED ORDER — DONEPEZIL HCL 10 MG PO TABS
5.0000 mg | ORAL_TABLET | Freq: Every day | ORAL | Status: DC
  Administered 2022-04-30 – 2022-05-01 (×2): 5 mg via ORAL
  Filled 2022-04-30 (×2): qty 1

## 2022-04-30 MED ORDER — IOHEXOL 350 MG/ML SOLN
75.0000 mL | Freq: Once | INTRAVENOUS | Status: AC | PRN
  Administered 2022-04-30: 75 mL via INTRAVENOUS

## 2022-04-30 MED ORDER — SODIUM CHLORIDE 0.9 % IV BOLUS
500.0000 mL | Freq: Once | INTRAVENOUS | Status: AC
  Administered 2022-04-30: 500 mL via INTRAVENOUS

## 2022-04-30 MED ORDER — RISPERIDONE 3 MG PO TABS
3.0000 mg | ORAL_TABLET | Freq: Every day | ORAL | Status: DC
  Administered 2022-04-30: 3 mg via ORAL
  Filled 2022-04-30 (×2): qty 1

## 2022-04-30 MED ORDER — ENOXAPARIN SODIUM 60 MG/0.6ML IJ SOSY
50.0000 mg | PREFILLED_SYRINGE | Freq: Two times a day (BID) | INTRAMUSCULAR | Status: DC
  Administered 2022-04-30 – 2022-05-01 (×2): 50 mg via SUBCUTANEOUS
  Filled 2022-04-30 (×2): qty 0.6

## 2022-04-30 MED ORDER — HYDROXYZINE HCL 25 MG PO TABS
25.0000 mg | ORAL_TABLET | Freq: Two times a day (BID) | ORAL | Status: DC | PRN
  Administered 2022-04-30: 25 mg via ORAL
  Filled 2022-04-30: qty 1

## 2022-04-30 NOTE — ED Notes (Signed)
Patient transported to CT 

## 2022-04-30 NOTE — Care Management (Signed)
ED RNCM received consult for SNF vs HHPT  as per neurology recommendations.  Received patient's record and noted patient was in [facility] 12/23, and was considering an ALF but decided to stay at home. Patient has a PCP which she has been in close contact with for f/u.  Today patient presents with confusion and tremors difficulty walking   Patient is still being worked up, plan is to have patient evaluated by PT in the am to determine level of care . TOC will follow up in the am.

## 2022-04-30 NOTE — ED Triage Notes (Signed)
Pt BIB [facility] from home, daughter called for increasing confusion with tremors over past 3 days. Patient can intermittently answer appropriately mixed with word salad. Per daughter to EMS patient gait is abnormal as well. Per daughter, pt has hx of blood clots and stroke with lovenox coverage.

## 2022-04-30 NOTE — ED Provider Notes (Signed)
[facility] Provider Note   CSN: [id number] Arrival date & time: 04/30/22  1410     History {Add pertinent medical, surgical, social history, OB history to HPI:1} Chief Complaint  Patient presents with   Altered Mental Status    [name] is a 67 y.o. female.  HPI Patient presents from home via EMS with concern for confusion, tremor. Patient has a history of prior sinus venous thrombosis has similar been taking her Lovenox. EMS reports the patient was confused, intermittently answering questions appropriately, and occasionally with dysarthria. The patient herself offers brief responses, denies pain, denies focal weakness, cannot provide additional details.     Home Medications Prior to Admission medications   Medication Sig Start Date End Date Taking? Authorizing Provider  diazepam (VALIUM) 5 MG tablet Take 1 tablet (5 mg total) by mouth at bedtime as needed for anxiety. 03/29/22   Sater, Nanine Means, MD  diazepam (VALIUM) 5 MG tablet Take 5 mg by mouth.    [provider]  divalproex (DEPAKOTE) 250 MG DR tablet Take 1 tablet (250 mg total) by mouth in the morning, at noon, and at bedtime. 03/16/22   Donnal Moat T, PA-C  donepezil (ARICEPT) 5 MG tablet Take 5 mg by mouth daily. 03/01/22   [provider]  enoxaparin (LOVENOX) 60 MG/0.6ML injection Inject 0.5 mLs (50 mg total) into the skin 2 (two) times daily. 12/20/21 04/19/22  Dede Query T, PA-C  gabapentin (NEURONTIN) 300 MG capsule Take 1 capsule (300 mg total) by mouth 2 (two) times daily. 04/27/22   Addison Lank, PA-C  hydrOXYzine (ATARAX) 25 MG tablet Take 1 tablet (25 mg total) by mouth 2 (two) times daily as needed. 03/05/22   Sater, Nanine Means, MD  lactulose (CHRONULAC) 10 GM/15ML solution Take 15 mLs (10 g total) by mouth 2 (two) times daily as needed for mild constipation. Titrate for 2-3 BM daily. Hold if loose stool. 11/25/21   Antonieta Pert, MD   methylPREDNISolone (MEDROL DOSEPAK) 4 MG TBPK tablet Please follow instructions per dosepak Patient not taking: Reported on 04/27/2022 04/16/22   Tegeler, Gwenyth Allegra, MD  midodrine (PROAMATINE) 2.5 MG tablet Take 1 tablet (2.5 mg total) by mouth 3 (three) times daily with meals. 02/23/22   Shawna Clamp, MD  omeprazole (PRILOSEC) 40 MG capsule Take 40 mg by mouth daily as needed (acid reflux).    [provider]  oxycodone (OXY-IR) 5 MG capsule Take 2 capsules (10 mg total) by mouth every 4 (four) hours as needed. Patient not taking: Reported on 04/27/2022 04/16/22   Tegeler, Gwenyth Allegra, MD  risperiDONE (RISPERDAL) 3 MG tablet Take 1 tablet (3 mg total) by mouth at bedtime. 03/16/22   Addison Lank, PA-C      Allergies    Penicillins    Review of Systems   Review of Systems  All other systems reviewed and are negative.   Physical Exam Updated Vital Signs BP 138/85   Pulse 75   Temp 98.1 F (36.7 C) (Oral)   Resp 18   SpO2 97%  Physical Exam Vitals and nursing note reviewed.  Constitutional:      General: She is not in acute distress.    Appearance: She is well-developed.  HENT:     Head: Normocephalic and atraumatic.  Eyes:     Conjunctiva/sclera: Conjunctivae normal.  Cardiovascular:     Rate and Rhythm: Normal rate and regular rhythm.  Pulmonary:  Effort: Pulmonary effort is normal. No respiratory distress.     Breath sounds: Normal breath sounds. No stridor.  Abdominal:     General: There is no distension.  Skin:    General: Skin is warm and dry.  Neurological:     Mental Status: She is alert.     Cranial Nerves: No cranial nerve deficit.     Motor: Atrophy present.     Comments: Patient moves all extremities spontaneously, face is symmetric, speech is sparse, but responses are appropriate  Psychiatric:        Mood and Affect: Mood normal.     ED Results / Procedures / Treatments   Labs (all labs ordered are listed, but only abnormal results  are displayed) Labs Reviewed  APTT - Abnormal; Notable for the following components:      Result Value   aPTT 59 (*)    All other components within normal limits  CBC - Abnormal; Notable for the following components:   RDW 15.7 (*)    All other components within normal limits  COMPREHENSIVE METABOLIC PANEL - Abnormal; Notable for the following components:   Total Protein 5.5 (*)    AST 11 (*)    All other components within normal limits  PROTIME-INR  DIFFERENTIAL  ETHANOL  RAPID URINE DRUG SCREEN, HOSP PERFORMED  URINALYSIS, ROUTINE W REFLEX MICROSCOPIC  I-STAT CHEM 8, ED    EKG EKG Interpretation  Date/Time:  Monday April 30 2022 14:37:36 EST Ventricular Rate:  74 PR Interval:  115 QRS Duration: 78 QT Interval:  419 QTC Calculation: 465 R Axis:   67 Text Interpretation: Sinus rhythm Borderline short PR interval Abnormal ECG Confirmed by Carmin Muskrat [phone]) on 04/30/2022 3:59:59 PM  Radiology CT Head Wo Contrast  Result Date: 04/30/2022 CLINICAL DATA:  Mental status change, unknown cause EXAM: CT HEAD WITHOUT CONTRAST TECHNIQUE: Contiguous axial images were obtained from the base of the skull through the vertex without intravenous contrast. RADIATION DOSE REDUCTION: This exam was performed according to the departmental dose-optimization program which includes automated exposure control, adjustment of the mA and/or kV according to patient size and/or use of iterative reconstruction technique. COMPARISON:  CT head November 27, 23. FINDINGS: Brain: No evidence of acute infarction, hemorrhage, hydrocephalus, extra-axial collection or mass lesion/mass effect. Vascular: No hyperdense vessel. Skull: No acute fracture. Sinuses/Orbits: Clear sinuses.  No acute orbital findings. Other: No mastoid effusions. IMPRESSION: No evidence of acute intracranial abnormality. Electronically Signed   By: Margaretha Sheffield M.D.   On: 04/30/2022 15:39    Procedures Procedures  {Document cardiac  monitor, telemetry assessment procedure when appropriate:1}  Medications Ordered in ED Medications  sodium chloride 0.9 % bolus 500 mL (has no administration in time range)    Followed by  0.9 %  sodium chloride infusion (has no administration in time range)    ED Course/ Medical Decision Making/ A&P   {   Click here for ABCD2, HEART and other calculatorsREFRESH Note before signing :1}                          Medical Decision Making Amount and/or Complexity of Data Reviewed Labs: ordered. Radiology: ordered.  Risk Prescription drug management.    4:12 PM Patient in no distress, awake, alert.  Initial findings reassuring, head CT without obvious acute change, she is not a candidate for MRI given her pacemaker.  We are awaiting neurology consult.  In essence this elderly female with  history of tremor, sinus venous thrombosis presents with confusion, dysarthria, per report.  Here she is withdrawn, but in no distress, moving all extremity spontaneously to command.  She is hemodynamically unremarkable, has seemingly been compliant with her anticoagulation, with concern for persistent venous sinus thrombosis versus other intracranial abnormality, neurology consult was placed.  On signout the patient is awaiting this consultation, additional monitoring, management.  Final Clinical Impression(s) / ED Diagnoses Final diagnoses:  Confusion

## 2022-04-30 NOTE — Consult Note (Signed)
NEUROLOGY CONSULTATION NOTE   Date of service: April 30, 2022 Patient Name: [name] MRN:  [id number] DOB:  1955-08-25 Reason for consult: "possible stroke work-up" Requesting Provider: Drenda Freeze, MD  History of Present Illness  ANALLELI COVEN is a 67 y.o. female with PMH significant for prior sinus venous thrombosis, currently on Lovenox post PE in August, anxiety/depression, bipolar disorder, cervical cancer, headaches, hyperlipidemia, non- MRI compatible pacemaker, current smoker, vertigo presented to ED with concern for 2 to 3 days onset of confusion, unsteadiness, slurred speech. Neurology was consulted due to the symptoms and to review for possible stroke workup needed.  Upon assessment patient is alert was confused to month but was redirectable, naming and repetition intact, followed commands, able to give clear history, no weakness noted in arms or legs.   Reviewed medication list with patient, she states that her psychiatrist just increased her gabapentin and that she was recently on oxycodone after going to the ED with back pain 04/16/22.  Patient states that she is almost out of her medication. She is not on oxycdone anymore. She only started gabapentin a few days ago. Patient states that she does take gabapentin, hydroxyzine, risperdal and Depakote every day.  Patient was placed on Depakote in August after having a seizure, reportedly her first seizure. Patient states that her unsteadiness is sometimes increased and at its worst after she takes her medication. Patient states she does not take Aricept that is on her medication list.  Patient had PE in August.  During this time, hypercoagulable labs were remarkable for possible antiphospholipid syndrome.  Was referred to hematology/oncology.  Unsure if patient followed up on this.    ROS   Constitutional Denies weight loss, fever and chills.   HEENT Denies changes in vision and hearing.   Respiratory Denies SOB and  cough.   CV Denies palpitations and CP   GI Denies abdominal pain, nausea, vomiting and diarrhea.   GU Denies dysuria and urinary frequency.   MSK Denies myalgia and joint pain.   Skin Denies rash and pruritus.   Neurological Denies headache and syncope.   Psychiatric Denies recent changes in mood. Denies anxiety and depression.    Past History   Past Medical History:  Diagnosis Date   Anxiety    anxiety and mild depressive order systoms   Bipolar disorder ([place])     (02/20/2013)   Cervical cancer ([place]) 03/19/1980   Cholelithiasis 11/11/2021   Depression    Dysrhythmia    Headache(784.0)    "monthly" (02/20/2013)   Hyperlipidemia 11/11/2021   Pacemaker    medtronic   PONV (postoperative nausea and vomiting)    Sinus pause 02/14/2013   Syncope and collapse    echo-April 12,2012-EF 55% Echo normal; recorder with prolonged sinus pauses --> status post   Tobacco abuse    Vertigo    Past Surgical History:  Procedure Laterality Date   ABDOMINAL HYSTERECTOMY  03/19/1980   "partial"   BACK SURGERY     CERVICAL FUSION Left 11/2013   C4-C6   CERVICAL FUSION     3,4,5,   INSERT / REPLACE / REMOVE PACEMAKER  02/20/2013   medtronic   LOOP RECORDER IMPLANT N/A 11/06/2012   Procedure: LINQ LOOP RECORDER IMPLANT;  Surgeon: Sanda Klein, MD;  Location: [facility] CATH [facility];  Service: Cardiovascular;  Laterality: N/A;   NECK SURGERY  11/17/2013   PACEMAKER PLACEMENT N/A 02/16/2013   PERMANENT PACEMAKER INSERTION N/A 02/20/2013   Procedure: PERMANENT PACEMAKER INSERTION;  Surgeon: Sanda Klein, MD;  Location: [facility] CATH LAB;  Service: Cardiovascular;  Laterality: N/A;   POSTERIOR LUMBAR FUSION  ~ 2009   ROBOTIC ASSISTED SALPINGO OOPHERECTOMY Bilateral 11/25/2018   Procedure: XI ROBOTIC ASSISTED BILATERAL SALPINGO OOPHORECTOMY and LYSIS OF ADHESIONS.;  Surgeon: Everitt Amber, MD;  Location: [facility] ORS;  Service: Gynecology;  Laterality: Bilateral;   SPINAL FUSION  03/19/2014   TONSILLECTOMY     TRANSTHORACIC  ECHOCARDIOGRAM  07/11/2010   The left atrial size is normal.There is no evidence of mitral vavle prolaspe.Right ventriicular systolic pressure is normal . Injection of contrast documented no interatrial shunt. Essentially normal 2D echo -doppler study    Family History  Adopted: Yes  Problem Relation Age of Onset   Heart attack Brother    Social History   Socioeconomic History   Marital status: Widowed    Spouse name: Not on file   Number of children: 3   Years of education: 58   Highest education level: Not on file  Occupational History   Not on file  Tobacco Use   Smoking status: Every Day    Packs/day: 0.50    Years: 40.00    Total pack years: 20.00    Types: Cigarettes   Smokeless tobacco: Never  Vaping Use   Vaping Use: Never used  Substance and Sexual Activity   Alcohol use: No   Drug use: No   Sexual activity: Yes  Other Topics Concern   Not on file  Social History Narrative   Married 29 years . Has 3 children. Current smoker -1 pack a day-for 36 years .Alcohol :  1 beer on occasion.   She is soon to be a grandmother.  His before to going on a trip up to the [place] to be closer to family when the baby is born.  She is extremely excited.   She very much would like to move back up to that area to be closer to family, but the whether is it just too cold in the winter.   Right handed    Soda daily   Lives alone   Social Determinants of Health   Financial Resource Strain: Low Risk  (10/11/2020)   Overall Financial Resource Strain (CARDIA)    Difficulty of Paying Living Expenses: Not hard at all  Food Insecurity: No Food Insecurity (11/22/2021)   Hunger Vital Sign    Worried About Running Out of Food in the Last Year: Never true    Ran Out of Food in the Last Year: Never true  Transportation Needs: No Transportation Needs (11/22/2021)   PRAPARE - Hydrologist (Medical): No    Lack of Transportation (Non-Medical): No   Physical Activity: Not on file  Stress: No Stress Concern Present (10/11/2020)   Glide    Feeling of Stress : Only a little  Social Connections: Not on file   Allergies  Allergen Reactions   Penicillins Other (See Comments)    Yeast infection Did it involve swelling of the face/tongue/throat, SOB, or low BP? No Did it involve sudden or severe rash/hives, skin peeling, or any reaction on the inside of your mouth or nose? No Did you need to seek medical attention at a hospital or doctor's office? Yes When did it last happen? More than 5 years ago  If all above answers are "NO", may proceed with cephalosporin use.     Medications  (Not  in a hospital admission)   No current facility-administered medications on file prior to encounter.   Current Outpatient Medications on File Prior to Encounter  Medication Sig Dispense Refill   diazepam (VALIUM) 5 MG tablet Take 1 tablet (5 mg total) by mouth at bedtime as needed for anxiety. 30 tablet 2   diazepam (VALIUM) 5 MG tablet Take 5 mg by mouth.     divalproex (DEPAKOTE) 250 MG DR tablet Take 1 tablet (250 mg total) by mouth in the morning, at noon, and at bedtime. 90 tablet 1   donepezil (ARICEPT) 5 MG tablet Take 5 mg by mouth daily.     enoxaparin (LOVENOX) 60 MG/0.6ML injection Inject 0.5 mLs (50 mg total) into the skin 2 (two) times daily. 30 mL 3   gabapentin (NEURONTIN) 300 MG capsule Take 1 capsule (300 mg total) by mouth 2 (two) times daily. 60 capsule 1   hydrOXYzine (ATARAX) 25 MG tablet Take 1 tablet (25 mg total) by mouth 2 (two) times daily as needed. 60 tablet 5   lactulose (CHRONULAC) 10 GM/15ML solution Take 15 mLs (10 g total) by mouth 2 (two) times daily as needed for mild constipation. Titrate for 2-3 BM daily. Hold if loose stool. 236 mL 0   methylPREDNISolone (MEDROL DOSEPAK) 4 MG TBPK tablet Please follow instructions per dosepak (Patient not taking:  Reported on 04/27/2022) 21 each 0   midodrine (PROAMATINE) 2.5 MG tablet Take 1 tablet (2.5 mg total) by mouth 3 (three) times daily with meals. 90 tablet 0   omeprazole (PRILOSEC) 40 MG capsule Take 40 mg by mouth daily as needed (acid reflux).     oxycodone (OXY-IR) 5 MG capsule Take 2 capsules (10 mg total) by mouth every 4 (four) hours as needed. (Patient not taking: Reported on 04/27/2022) 20 capsule 0   risperiDONE (RISPERDAL) 3 MG tablet Take 1 tablet (3 mg total) by mouth at bedtime. 90 tablet 0     Vitals   Vitals:   04/30/22 1418 04/30/22 1515 04/30/22 1545 04/30/22 1730  BP:  131/81 126/79 (!) 159/113  Pulse:  72 69 73  Resp:  19 16 18  $ Temp:      TempSrc:      SpO2: 97% 99% 98% 100%     Physical Exam   General: Laying comfortably in bed; in no acute distress.  HENT: Normal oropharynx and mucosa. Normal external appearance of ears and nose.  Neck: Supple, no pain or tenderness  CV: No JVD. No peripheral edema.  Pulmonary: Symmetric Chest rise. Normal respiratory effort.  Abdomen: Soft to touch, non-tender.  Ext: No cyanosis, edema, or deformity  Skin: No rash. Normal palpation of skin.   Musculoskeletal: Normal digits and nails by inspection.   Neurologic Examination  Mental status/Cognition: Alert, oriented to self, place, and year, good attention. Confused to month, but redirectable and knew when asked the second time.  Speech/language: Fluent, comprehension intact, object naming intact, repetition intact. No slurring noticed, but speech did seem to slow down at times, mainly after speaking multiple sentences without a break.  Cranial nerves:  PERRL, VF full, EOMI, no gaze preference or nystagmus, Facial twitching seen with movement on Left side (appreciated twice when asking patient to smile, consistent appearance), Symmetrical facial sensation, hearing   Motor:  Bulk and Tone normal. No drift. Noted to have end intention and flapping tremor with some  asterixis/negative myoclonus. BUE: 4+/5 BLE: 4+/5.  Sensation: Intact and symmetric to light touch  Coordination/Complex Motor:  -  Finger to Nose: with end intention tremor. - Rapid alternating movement: slow, with orbiting.  - Gait: deferred  Labs   CBC:  Recent Labs  Lab 04/30/22 1422  WBC 6.6  NEUTROABS 3.0  HGB 12.2  HCT 36.4  MCV 92.2  PLT 123456    Basic Metabolic Panel:  Lab Results  Component Value Date   NA 141 04/30/2022   K 3.8 04/30/2022   CO2 22 04/30/2022   GLUCOSE 92 04/30/2022   BUN 13 04/30/2022   CREATININE 0.91 04/30/2022   CALCIUM 9.0 04/30/2022   GFRNONAA >60 04/30/2022   GFRAA 69 01/04/2020   Lipid Panel:  Lab Results  Component Value Date   LDLCALC 179 (H) 09/24/2018   HgbA1c:  Lab Results  Component Value Date   HGBA1C 5.6 04/17/2020   Urine Drug Screen:     Component Value Date/Time   LABOPIA POSITIVE (A) 05/03/2021 1807   COCAINSCRNUR NONE DETECTED 05/03/2021 1807   COCAINSCRNUR Negative 03/10/2021 1757   LABBENZ POSITIVE (A) 05/03/2021 1807   AMPHETMU NONE DETECTED 05/03/2021 1807   THCU NONE DETECTED 05/03/2021 1807   LABBARB NONE DETECTED 05/03/2021 1807    Alcohol Level     Component Value Date/Time   ETH <10 04/30/2022 1422    CT Head without contrast(Personally reviewed): No evidence of acute intracranial abnormality.   CT angio Head and Neck with contrast(Personally reviewed): 1. No intracranial large vessel occlusion or proximal stenosis. 2. Both posterior cerebral arteries take fetal origin from the anterior circulation. Therefore, the vertebrobasilar system is diminutive. 3. Minimal atherosclerotic change at the left carotid bifurcation but no stenosis. 4. Question vocal fold paresis on the right, but without identifiable causative lesion. 5. Emphysema and scarring in the upper lungs. 6. Distant ACDF C4 through C6. Mild spondylosis above and below that.   Impression   SHAQUELLE HANBACK is a 67 y.o.  female with PMH significant for prior sinus venous thrombosis, currently on Lovenox post PE in August, anxiety/depression, bipolar disorder,  headaches, non- MRI compatible pacemaker. Her neurologic examination is notable for reports of unsteadiness, intention tremor with asterixis/negative myoclonus and slow speech x2-3 days.  Concern for medication interaction vs polypharmacy. Unable to get MRI d/t pacemaker.   Recommendations  - encourage use of pill planner and increased support from daughter for medication adherence - decrease gabapentin back to her prior dose of 159m BID. Symptoms started a few days after her dose was increased to 3077mBID. - PT and OT Evaluation. Specially given unsteadiness. She is high risk for falls and [facility] and CVST. - close follow-up with outpatient neurologist, Dr. SaFelecia ShellingRecommend alternative to Depakote. Lamotrigine might be a better alternative. - ammonia level as patient is on VPA at home. ______________________________________________________________________   Pt seen by Neuro NP/APP and later by MD. Note/plan to be edited by MD as needed.    ErOtelia SanteeDNP, AGACNP-BC [facility] Please use AMION for pager and EPIC for messaging   Thank you for the opportunity to take part in the care of this patient. If you have any further questions, please contact the neurology consultation attending.   NEUROHOSPITALIST ADDENDUM Performed a face to face diagnostic evaluation.   I have reviewed the contents of history and physical exam as documented by PA/ARNP/Resident and agree with above documentation.  I have discussed and formulated the above plan as documented. Edits to the note have been made as needed.  Impression/Key exam findings/Plan:  20F with bipolar and seizures on Depakote, Gabapentin, also on AC for hx of PE/CVST. She comes in with fogginess and hard time focusing, worsening tremors and unsteady over the last 2-3  days. I spoke with patient and her daughter [name] over phone. Has had tremors for as long as she can recall. They noticed that it worsened after she was started on Depakote for seizures. She was having periods of memory lapses and starring off so seizures could have been going on for a while. This improved after aug 2023 when she was started on depakote but her tremors significantly worsened. Around the same time, psychiatrist stopped her Xanax abruptly per patient and daughter instead of weaning it down. She was coping with it and seemed fine. She is on gabapentin per psychiatry. This was upped to 370m BID around 04/27/22. Tremors worsened since starting higher dose of gabapentin. Today, unable to dress and she lives in an independent living so called on her daughter to help her out. She seemed confused and lost. Daughter brought her to the ED. She seems improved in the ED and patient really wants to go home. Exam with intention tremor, poor attention able to do simple calculations but struggles with complex calculations, spells world backwords and states month in a year. She has intention tremor, no past pointing of ataxia. Has mild asterixis/neg myoclonus with arms outstretched. Legs with no significant ataxia on HTS. She reports that her coordination is improved since she is in the ED.  Overall, suspect that her presentation is likely medication induced due to gabapentin, depakote, possibly opiods but patient reports that she is not taking anymore oxycodone. Less likely tardive dyskinesias, tremors does not seem to be parkinsonian. Vitals normal and no hyperreflexia concerning for serotonin syndrome.  Patient really wants to go home. I spoke with her and daughter and convinced her to atleast stay for PT/OT evaluation in AM since she is at an independent living facility and with her tremors and earlier noted unsteadiness, is high risk for falls which could a big problem since she is on  anticoagulation.  Would recommend holding gabapentin tonight and resuming it at her previous dose of 1093mBID.  In the outpatient setting, recommend alternative to Depakote given tremors. Could try lamictal. IF possible, wean off gabapentin.  Will also check for Ammonia.  SaDonnetta SimpersMD [facility] [phone] If 7pm to 7am, please call on call as listed on AMION.

## 2022-04-30 NOTE — ED Provider Notes (Signed)
  Physical Exam  BP (!) 142/79   Pulse 62   Temp (!) 97.3 F (36.3 C) (Oral)   Resp 16   SpO2 100%   Physical Exam  Procedures  Procedures  ED Course / MDM    Medical Decision Making Care assumed at 4 PM.  Patient is here with trouble speaking and confusion.  Patient is on Lovenox for venous dural thrombosis.  Neurology was consulted and recommended CTA with venous phase as well as neurology evaluation  6 pm CTA and CTV did not show any recurrent venous thrombosis or LVO.  Neurology consult is pending  9 pm Dr. Sunday Spillers saw the patient.  Patient had medication adjustment recently.  Her gabapentin was increased and she is also on Depakote and Valium.  He thinks it is likely from oversedation from polypharmacy.  Admission was considered but patient really wants to go back to facility.  Neurology recommends holding her sedatives this evening and have PT and OT evaluation in the morning.  I also left a message with social work.  Per neurology note "Patient really wants to go home. I spoke with her and daughter and convinced her to atleast stay for PT/OT evaluation in AM since she is at an independent living facility and with her tremors and earlier noted unsteadiness, is high risk for falls which could a big problem since she is on anticoagulation. Would recommend holding gabapentin tonight and resuming it at her previous dose of '100mg'$  BID. In the outpatient setting, recommend alternative to Depakote given tremors. Could try lamictal. IF possible, wean off gabapentin."    Amount and/or Complexity of Data Reviewed Labs: ordered. Radiology: ordered.  Risk Prescription drug management.          Drenda Freeze, MD 04/30/22 [phone]

## 2022-05-01 ENCOUNTER — Telehealth: Payer: Self-pay | Admitting: Physician Assistant

## 2022-05-01 DIAGNOSIS — R410 Disorientation, unspecified: Secondary | ICD-10-CM | POA: Diagnosis not present

## 2022-05-01 NOTE — Progress Notes (Signed)
TOC CSW spoke with [name], pts daughter.  [name] stated her sister, [name] had a procedure today and would not be able to pick up pt. [name] did state that her boyfriend was there with her and he would walk out to meet pt upon her arrival and walk her to the apartment for safety.  Adalia Pettis Tarpley-Carter, MSW, LCSW-A Pronouns:  She/Her/Hers [facility] HealthTransitions of Care Clinical Social Worker Direct Number:  [phone] [email or web address]

## 2022-05-01 NOTE — Telephone Encounter (Signed)
Daughter alyssa lvm that her mom is in the hospital with cognitive confusion. The doctor

## 2022-05-01 NOTE — Progress Notes (Addendum)
Physical Therapy Evaluation Patient Details Name: [name] MRN: [id number] DOB: [birth year] Today's Date: 05/01/2022  History of Present Illness  [name] is a 67 y.o. female presented to ED with concern for 2 to 3 days onset of confusion, unsteadiness, slurred speech. CT/CTA no acute findings, unable to do MRI due to pacemaker. All issues per chart may be from oversedation from polypharmacy. PMH significant for prior sinus venous thrombosis, PE in August, anxiety/depression, bipolar disorder, tremors, cervical cancer, headaches, hyperlipidemia, non- MRI compatible pacemaker, current smoker, vertigo, seizures, ACDF C4-C6  Clinical Impression  Pt was seen for observation and testing of gait and balance, noting significant issues related to fatigue and distraction that increase likelihood of falling.  Pt is adamant about not wanting to go to SNF, but is not supervised 24/7 at home.  Have provided information to daughter to talk with her mother and try to get a consensus on how to proceed.  Follow acutely for goals of PT as are outlined, and focus on safety and high level balance control.     Recommendations for follow up therapy are one component of a multi-disciplinary discharge planning process, led by the attending physician.  Recommendations may be updated based on patient status, additional functional criteria and insurance authorization.  Follow Up Recommendations Skilled nursing-short term rehab (<3 hours/day) Can patient physically be transported by private vehicle: Yes    Assistance Recommended at Discharge Frequent or constant Supervision/Assistance  Patient can return home with the following  A little help with walking and/or transfers;A little help with bathing/dressing/bathroom;Assistance with cooking/housework;Direct supervision/assist for financial management;Direct supervision/assist for medications management;Assist for transportation;Help with stairs or ramp for  entrance    Equipment Recommendations None recommended by PT  Recommendations for Other Services       Functional Status Assessment Patient has had a recent decline in their functional status and demonstrates the ability to make significant improvements in function in a reasonable and predictable amount of time.     Precautions / Restrictions Precautions Precautions: Fall Precaution Comments: posterior and R side instability Restrictions Weight Bearing Restrictions: No      Mobility  Bed Mobility Overal bed mobility: Modified Independent                  Transfers Overall transfer level: Needs assistance Equipment used: None Transfers: Sit to/from Stand Sit to Stand: Min guard           General transfer comment: min guard to walk but supervision to min guard to stand    Ambulation/Gait Ambulation/Gait assistance: Min guard Gait Distance (Feet): 140 Feet (70 x 2) Assistive device: Rolling walker (2 wheels), None Gait Pattern/deviations: Step-through pattern, Decreased stride length, Wide base of support Gait velocity: reduced, variable Gait velocity interpretation: <1.31 ft/sec, indicative of household ambulator Pre-gait activities: standing balance ck General Gait Details: pt is walking without and with AD, noted in both cases there were moments of instability given pt lists posteriorly  Stairs            Wheelchair Mobility    Modified Rankin (Stroke Patients Only)       Balance Overall balance assessment: Needs assistance Sitting-balance support: Feet supported Sitting balance-Leahy Scale: Fair     Standing balance support: Bilateral upper extremity supported, During functional activity, No upper extremity supported Standing balance-Leahy Scale: Fair Standing balance comment: less than fair dynamically                 Standardized Balance Assessment  Standardized Balance Assessment : Berg Balance Test Berg Balance Test Sit to  Stand: Able to stand  independently using hands Standing Unsupported: Able to stand 2 minutes with supervision Sitting with Back Unsupported but Feet Supported on Floor or Stool: Able to sit safely and securely 2 minutes Stand to Sit: Sits safely with minimal use of hands Transfers: Able to transfer safely, minor use of hands Standing Unsupported with Eyes Closed: Unable to keep eyes closed 3 seconds but stays steady Standing Ubsupported with Feet Together: Able to place feet together independently and stand for 1 minute with supervision From Standing, Reach Forward with Outstretched Arm: Can reach forward >5 cm safely (2") From Standing Position, Pick up Object from Floor: Able to pick up shoe, needs supervision From Standing Position, Turn to Look Behind Over each Shoulder: Looks behind one side only/other side shows less weight shift Turn 360 Degrees: Able to turn 360 degrees safely but slowly Standing Unsupported, Alternately Place Feet on Step/Stool: Needs assistance to keep from falling or unable to try Standing Unsupported, One Foot in Front: Loses balance while stepping or standing Standing on One Leg: Unable to try or needs assist to prevent fall Total Score: 32         Pertinent Vitals/Pain Pain Assessment Pain Assessment: No/denies pain    Home Living Family/patient expects to be discharged to:: Private residence Living Arrangements: Children Available Help at Discharge: Family;Available PRN/intermittently Type of Home: Apartment Home Access: Stairs to enter Entrance Stairs-Rails: Right Entrance Stairs-Number of Steps: 14   Home Layout: One level Home Equipment: Rollator (4 wheels);Rolling Walker (2 wheels);Shower seat Additional Comments: pt is home with daughter who is working    Prior Function Prior Level of Function : Independent/Modified Independent;Driving             Mobility Comments: driving up to recently ADLs Comments: independent     Hand  Dominance   Dominant Hand: Right    Extremity/Trunk Assessment   Upper Extremity Assessment Upper Extremity Assessment: Defer to OT evaluation    Lower Extremity Assessment Lower Extremity Assessment: Generalized weakness    Cervical / Trunk Assessment Cervical / Trunk Assessment: Kyphotic (mild)  Communication   Communication: No difficulties  Cognition Arousal/Alertness: Awake/alert Behavior During Therapy: Impulsive Overall Cognitive Status: Impaired/Different from baseline                                          General Comments General comments (skin integrity, edema, etc.): pt scored poorly on Berg but is distracted and feeling lack of confidence in being able to do these tasks, which is correlated with higher fall risk    Exercises     Assessment/Plan    PT Assessment Patient needs continued PT services  PT Problem List Decreased strength;Decreased activity tolerance;Decreased balance;Decreased coordination;Decreased knowledge of use of DME;Decreased safety awareness       PT Treatment Interventions DME instruction;Gait training;Stair training;Functional mobility training;Therapeutic activities;Therapeutic exercise;Balance training;Neuromuscular re-education;Patient/family education    PT Goals (Current goals can be found in the Care Plan section)  Acute Rehab PT Goals Patient Stated Goal: to go directly home PT Goal Formulation: With patient/family Time For Goal Achievement: 05/08/22 Potential to Achieve Goals: Good    Frequency Min 3X/week     Co-evaluation               AM-PAC PT "6 Clicks" Mobility  Outcome Measure  Help needed turning from your back to your side while in a flat bed without using bedrails?: None Help needed moving from lying on your back to sitting on the side of a flat bed without using bedrails?: None Help needed moving to and from a bed to a chair (including a wheelchair)?: A Little Help needed standing up  from a chair using your arms (e.g., wheelchair or bedside chair)?: A Little Help needed to walk in hospital room?: A Little Help needed climbing 3-5 steps with a railing? : Total 6 Click Score: 18    End of Session   Activity Tolerance: Treatment limited secondary to medical complications (Comment) Patient left: in bed;with call bell/phone within reach Nurse Communication: Mobility status PT Visit Diagnosis: Unsteadiness on feet (R26.81);Other abnormalities of gait and mobility (R26.89);Difficulty in walking, not elsewhere classified (R26.2)    Time: 1050-1108 PT Time Calculation (min) (ACUTE ONLY): 18 min   Charges:   PT Evaluation $PT Eval Moderate Complexity: 1 Mod         Ramond Dial 05/01/2022, 11:35 AM  Mee Hives, PT PhD Acute Rehab Dept. Number: [place] and [place]

## 2022-05-01 NOTE — Care Management (Signed)
ED RNCM spoke with daughter [name] [phone] informed her of [facility] services. Explained that someone will contact patient at the verified number to arrange the initial visit, verbalized understanding. Updated Kerri Perches RN and ED CSW on plan.  No further ED RN Case Management needs identified.

## 2022-05-01 NOTE — Telephone Encounter (Signed)
Please see message from dtr.  I know you no longer prescribe benzodiazepines for her, but Dr. Felecia Shelling is prescribing diazepam.   04/17/2022 04/17/2022 1  Oxycodone Hcl (Ir) 10 Mg Tab 21.00 7 Sa Whi [id number] Nor ([id number]) 0/0 45.00 MME Medicare [place] 04/01/2022 03/16/2022 1  Gabapentin 100 Mg Capsule 120.00 30 [place] [id number] Nor ([id number]) 0/1  Medicare [place] 03/29/2022 03/29/2022 2  Diazepam 5 Mg Tablet 30.00 30 Ri Sat [id number] Nor ([id number]) 0/2 0.50 LME Medicare  03/15/2022 03/14/2022 1  Gabapentin 100 Mg Capsule 90.00 30 Ol Mcl

## 2022-05-01 NOTE — Telephone Encounter (Signed)
Have her decrease the Gabapentin 300 mg to 1 daily for a wk, then stop. She is on several meds that can cause confusion, I recommend stopping the Valium and Hydroxyzine as well. I don't want to start anything else for anxiety prevention until the confusion is resolved. Any sedating medications that can cause confusion can cause falls, which she has a history of. That can lead to serious injury, hip fractures or other fx, causing worsening debility and can lead to death.

## 2022-05-01 NOTE — Progress Notes (Signed)
CSW spoke with patients daughter [name] [phone] who stated patient is going to discharge home with home health. [name] stated patient can take an uber home since her sister [name]'s phone is going to voicemail. Patient has a key to get in.

## 2022-05-01 NOTE — Telephone Encounter (Signed)
Tried reaching patient. Left message that a MyChart message would be sent to her with Teresa's response.

## 2022-05-01 NOTE — Evaluation (Signed)
Occupational Therapy Evaluation and Discharge Patient Details Name: [name] MRN: [id number] DOB: 1955-12-26 Today's Date: 05/01/2022   History of Present Illness [name] is a 67 y.o. female presented to ED with concern for 2 to 3 days onset of confusion, unsteadiness, slurred speech. CT/CTA no acute findings, unable to do MRI due to pacemaker. All issues per chart may be from oversedation from polypharmacy. PMH significant for prior sinus venous thrombosis, PE in August, anxiety/depression, bipolar disorder, tremors, cervical cancer, headaches, hyperlipidemia, non- MRI compatible pacemaker, current smoker, vertigo, seizures   Clinical Impression   This 67 yo female admitted with above presents to acute OT with PLOF of being independent with basic ADls, IADLs, and driving. Currently she is at an overall S level with min guard A when up on her feet without AD with her stating she doesn't feel as steady as she normally would and does not normally use an AD at home, but does have a rollator and RW as well as a tub seat. HHOT and prn A from dtr is recommended at D/C. Spoke with evaluating PT about steps and perhaps using RW for AD when they see her. No further acute OT needs, we will sign off.      Recommendations for follow up therapy are one component of a multi-disciplinary discharge planning process, led by the attending physician.  Recommendations may be updated based on patient status, additional functional criteria and insurance authorization.   Follow Up Recommendations  Home health OT     Assistance Recommended at Discharge PRN  Patient can return home with the following A little help with walking and/or transfers;A little help with bathing/dressing/bathroom;Help with stairs or ramp for entrance;Assist for transportation    Functional Status Assessment  Patient has had a recent decline in their functional status and demonstrates the ability to make significant improvements in  function in a reasonable and predictable amount of time.  Equipment Recommendations  None recommended by OT       Precautions / Restrictions Precautions Precautions: Fall Restrictions Weight Bearing Restrictions: No      Mobility Bed Mobility Overal bed mobility: Independent                  Transfers Overall transfer level: Needs assistance Equipment used: None Transfers: Sit to/from Stand Sit to Stand: Min guard           General transfer comment: ambulation around unit min guard A without AD      Balance Overall balance assessment: Mild deficits observed, not formally tested                                         ADL either performed or assessed with clinical judgement   ADL                                         General ADL Comments: overall at a S level with min guard A when up on feet without RW     Vision Baseline Vision/History: 1 Wears glasses Ability to See in Adequate Light: 0 Adequate Patient Visual Report: No change from baseline Vision Assessment?: Yes Eye Alignment: Within Functional Limits Ocular Range of Motion: Within Functional Limits Alignment/Gaze Preference: Within Defined Limits Tracking/Visual Pursuits: Able to track stimulus in  all quads without difficulty Saccades: Within functional limits Convergence: Within functional limits Visual Fields: No apparent deficits            Pertinent Vitals/Pain Pain Assessment Pain Assessment: No/denies pain     Hand Dominance Right   Extremity/Trunk Assessment Upper Extremity Assessment Upper Extremity Assessment: Overall WFL for tasks assessed (tremors at rest and with activity in Bil UEs)           Communication Communication Communication: No difficulties   Cognition Arousal/Alertness: Awake/alert Behavior During Therapy: WFL for tasks assessed/performed Overall Cognitive Status: Within Functional Limits for tasks assessed                                                   Home Living Family/patient expects to be discharged to:: Private residence Living Arrangements: Children Available Help at Discharge: Family;Available PRN/intermittently Type of Home: Apartment Home Access: Stairs to enter [employer] of Steps: flight, landing, flight Entrance Stairs-Rails: Right (as you go up steps, wall on other side) Home Layout: One level     Bathroom Shower/Tub: Tub/shower unit;Curtain   Biochemist, clinical: Standard     Home Equipment: Rollator (4 wheels);Rolling Walker (2 wheels);Shower seat          Prior Functioning/Environment Prior Level of Function : Independent/Modified Independent;Driving                        OT Problem List: Decreased strength;Impaired balance (sitting and/or standing)         OT Goals(Current goals can be found in the care plan section) Acute Rehab OT Goals Patient Stated Goal: to go home         AM-PAC OT "6 Clicks" Daily Activity     Outcome Measure Help from another person eating meals?: None Help from another person taking care of personal grooming?: A Little Help from another person toileting, which includes using toliet, bedpan, or urinal?: A Little Help from another person bathing (including washing, rinsing, drying)?: A Little Help from another person to put on and taking off regular upper body clothing?: A Little Help from another person to put on and taking off regular lower body clothing?: A Little 6 Click Score: 19   End of Session Equipment Utilized During Treatment: Gait belt  Activity Tolerance: Patient tolerated treatment well Patient left: in bed;with call bell/phone within reach  OT Visit Diagnosis: Unsteadiness on feet (R26.81);Other abnormalities of gait and mobility (R26.89);Muscle weakness (generalized) (M62.81)                Time: 1000-1018 OT Time Calculation (min): 18 min Charges:  OT General Charges $OT  Visit: 1 Visit OT Evaluation $OT Eval Moderate Complexity: 1 [place] Office [phone]    Almon Register 05/01/2022, 10:39 AM

## 2022-05-01 NOTE — ED Provider Notes (Signed)
Emergency Medicine Observation Re-evaluation Note  [name] is a 68 y.o. female, seen on rounds today.  Pt initially presented to the ED for complaints of Altered Mental Status Currently, the patient is alert and ready to go home.  Physical Exam  BP 116/75   Pulse 72   Temp 98.5 F (36.9 C) (Oral)   Resp 16   SpO2 96%  Physical Exam General: Alert well in appearance sitting on the edge of the bed dressed Lungs: respirations nonlabored Psych: Pleasantly interactive with good eye contact  ED Course / MDM  EKG:EKG Interpretation  Date/Time:  Monday April 30 2022 14:37:36 EST Ventricular Rate:  74 PR Interval:  115 QRS Duration: 78 QT Interval:  419 QTC Calculation: 465 R Axis:   67 Text Interpretation: Sinus rhythm Borderline short PR interval Abnormal ECG Confirmed by Carmin Muskrat [phone]) on 04/30/2022 3:59:59 PM  I have reviewed the labs performed to date as well as medications administered while in observation.  Recent changes in the last 24 hours include proved condition and arrangements made for home physical therapy and care..  Plan  Current plan is for discharge.  Social work has made arrangements with family members for ongoing outpatient care.  The patient is alert sitting up at the edge of the bed dressed and well in appearance.  She is aware of the plan and she is to go home.    Charlesetta Shanks, MD 05/01/22 1806

## 2022-05-01 NOTE — ED Notes (Signed)
Pt was able to ambulate to the restroom. Gait was steady.

## 2022-05-01 NOTE — ED Notes (Signed)
Patient discharged home after speaking with social worker, pt, ot, and MD. RN spoke with patients daugter who assure that her boyfriend was home to take patient upstairs. RN also spoke to boyfriend who said he would take her upstairs. Pt then called uber and was discharged out of lobby. Pt had stable gait upon discharge, denied any complaints.

## 2022-05-01 NOTE — Telephone Encounter (Signed)
The doctor said that gabapentin can cause this to happen. So [name] would like [name] to find something similar to gabapentin that she could take instead. Please call [name] at [phone]

## 2022-05-01 NOTE — Progress Notes (Signed)
Patient declined SNF placement and would like to go home. CSW contacted patients daughter [name] who plans on talking with her mother. CSW left a message with daughter [name]

## 2022-05-08 ENCOUNTER — Other Ambulatory Visit: Payer: Self-pay | Admitting: Physician Assistant

## 2022-05-15 ENCOUNTER — Telehealth: Payer: Self-pay | Admitting: Physician Assistant

## 2022-05-15 NOTE — Telephone Encounter (Signed)
Pt called to report she is having motor tics and problems.  Hands are shaking- hard to eat.  Head bobbing too. Going on about a month.

## 2022-05-16 NOTE — Telephone Encounter (Signed)
Tried to call patient - get message that call cannot be completed at this time.

## 2022-05-17 NOTE — Telephone Encounter (Signed)
LVM to RC 

## 2022-05-17 NOTE — Telephone Encounter (Signed)
Please see message from patient. She said sx have been happening for about a month, they occur all the time. She denies interfering with sleep. She sounded clearer today than in past conversations. Her rates her depression at 2/10 and her anxiety as 10/10.

## 2022-05-18 NOTE — Telephone Encounter (Signed)
Did these movements start when we weaned off the Gabapentin? If so, and she can tolerate a very low dose (100 mg) we can add that back in cautiously, d/t previous confusion. Unknown whether related to Gabapentin or not.

## 2022-05-21 ENCOUNTER — Telehealth: Payer: Self-pay

## 2022-05-21 ENCOUNTER — Other Ambulatory Visit: Payer: Self-pay

## 2022-05-21 MED ORDER — ENOXAPARIN SODIUM 60 MG/0.6ML IJ SOSY: 50.0000 mg | PREFILLED_SYRINGE | Freq: Two times a day (BID) | INTRAMUSCULAR | 3 refills | Status: DC

## 2022-05-21 NOTE — Telephone Encounter (Signed)
T/C from pt requesting a refill on her Lovenox.  Last office note says 1 mg and last RX that has expired was for 0.'6mg'$  Please confirm dose.

## 2022-05-21 NOTE — Addendum Note (Signed)
Addended by: Dede Query T on: 05/21/2022 11:00 AM   Modules accepted: Orders

## 2022-05-21 NOTE — Telephone Encounter (Signed)
LVM to RC 

## 2022-05-22 NOTE — Telephone Encounter (Signed)
Noted  

## 2022-05-22 NOTE — Telephone Encounter (Signed)
Patient said sx were present when she was still on gabapentin.  When she was in the ER last she was told to get off gabapentin, Valium, and "hydro," as they all caused confusion. She said the shaking has not been as bad since she stopped the above.

## 2022-05-28 ENCOUNTER — Telehealth: Payer: Self-pay | Admitting: Neurology

## 2022-05-28 ENCOUNTER — Ambulatory Visit (INDEPENDENT_AMBULATORY_CARE_PROVIDER_SITE_OTHER): Payer: 59 | Admitting: Neurology

## 2022-05-28 ENCOUNTER — Encounter: Payer: Self-pay | Admitting: Neurology

## 2022-05-28 VITALS — BP 141/98 | HR 88 | Ht 67.0 in | Wt 111.0 lb

## 2022-05-28 DIAGNOSIS — R269 Unspecified abnormalities of gait and mobility: Secondary | ICD-10-CM

## 2022-05-28 DIAGNOSIS — G08 Intracranial and intraspinal phlebitis and thrombophlebitis: Secondary | ICD-10-CM

## 2022-05-28 DIAGNOSIS — D6861 Antiphospholipid syndrome: Secondary | ICD-10-CM | POA: Diagnosis not present

## 2022-05-28 DIAGNOSIS — R413 Other amnesia: Secondary | ICD-10-CM | POA: Diagnosis not present

## 2022-05-28 DIAGNOSIS — G4700 Insomnia, unspecified: Secondary | ICD-10-CM

## 2022-05-28 MED ORDER — LORAZEPAM 1 MG PO TABS: 1.0000 mg | ORAL_TABLET | Freq: Every day | ORAL | 2 refills | Status: DC

## 2022-05-28 NOTE — Telephone Encounter (Signed)
I misunderstood the last note, I thought she was better.  Please have her call the neurologist about the tremor.  Thanks.

## 2022-05-28 NOTE — Telephone Encounter (Signed)
Pt states she mentioned her shaking /possible tremor to RN in hopes that it would have been mentioned to Dr Felecia Shelling during the appointment.  Pt states she initially thought the shaking was from her psychiatric medication(prescribed by her Psychiatrist Dr Hector Brunswick at [phone]), she has since spoke with that provider.  They have advised her to speak with Dr Felecia Shelling about this shaking Martin Majestic she is experiencing.

## 2022-05-28 NOTE — Telephone Encounter (Signed)
Patient notified

## 2022-05-28 NOTE — Telephone Encounter (Signed)
Dr. Felecia Shelling- this is what I placed in visit notes from today: "RM 11, alone.  Has tremors in arms/legs/hands. Sx intermittent. Unable to sleep at night. Gets about 6 hr of sleep on a good night. No other sx. "

## 2022-05-28 NOTE — Progress Notes (Signed)
[facility]  PATIENT: [name] DOB: 1955-08-01  REFERRING DOCTOR OR PCP: Dr. Lujean Amel SOURCE: Patient, notes from Dr. Rolena Infante, imaging reports and lumbar MRI and CT images personally reviewed.   _________________________________   HISTORICAL  CHIEF COMPLAINT:  Chief Complaint  Patient presents with   Follow-up    RM 11, alone.  Has tremors in arms/legs/hands. Sx intermittent. Unable to sleep at night. Gets about 6 hr of sleep on a good night. No other sx.    HISTORY OF PRESENT ILLNESS:  [name] is a 67 y.o. woman with reduced memory and poor gait.  05/28/2022 The AB42/40 ratio and pTu181 were normal.   NFL slightly increased at 4.99 (<4.6).    She has not yet seen Rheum but has an appt.    She has an antiphospolipid antibody.     She is on Lovenox for the dural venous thrombosis.   She was briefly on coumadin late summer 2023 but it was stopped as nontherapeutic.  Genrrally for APL, coumadin is recommended over a DOAC.    She had some confusion last month and wen to the ED.   She was felt to be overmedicated and gabapentin ad valium.    She has trouble sleeping without valium.   She does take Risperdal at night but does not think iti is helping much.   She reports that she has had more tremors since her recent hospitalization.  She has memory loss (MoCA was 23/30 (mild cognitive impariemnt) last visit) .   She is confused at times.  Both memory and confusion are better than last visit.     She has orthostatic hypotension and falls last summer.  She was started on midodrine for the orthostatic hypotension.  Echocardiogram was normal.   History of thrombosis/APL syndrome In August 2023, she had mental status changes and seizures.  The CT head showed "Filling defect within the left sigmoid sinus and jugular vein is suspicious for nonocclusive thrombus. Consider confirmation with dedicated CT venogram of the head. "   CT venogram confirmed filling  defects in left transverse and sigmoid sinus and internal jugular vein.  She also had blood clots in her legs.  This led to a hospitalization in [place] and she was placed on Lovenox.  For the seizure she was placed on Depakote.  Some medications were changed.  Due to memory issues, Xanax was discontinued.  Antiphospholipid syndrome was assumed.  Initially she was placed on Lovenox and for a while was on a combination Lovenox and Coumadin and then Coumadin.  However, he had difficulty with both supratherapeutic and subtherapeutic levels on Coumadin so was placed back on the Lovenox  She is living with her daughter most of the past year.    Before that she was living independently some of the time but with family other time.  Family would need to help for some tasks.  I had seen her a couple times in 2021 for fibromyalgia type symptoms, gait disturbance and memory loss.  Labs in 2021 showed low B12 (146 with 180-914 normal).  CK was elevated at 836 (<234 is normal) while hospitalized but was normal when we checked at the last visit.    TSH, ammonia, lactic acid was normal.         02/28/2022    5:01 PM 02/28/2022    4:06 PM 01/04/2020   11:08 AM 07/02/2019    9:26 AM  Montreal Cognitive Assessment   Visuospatial/ Executive (0/5)  $'2 2 2 2  'C$ Naming (0/3) '3 3 3 2  '$ Attention: Read list of digits (0/2) '2 2 2 2  '$ Attention: Read list of letters (0/1) '1 1 1 1  '$ Attention: Serial 7 subtraction starting at 100 (0/3) '2 2 1 2  '$ Language: Repeat phrase (0/2) '1 1 2 2  '$ Language : Fluency (0/1) 0 0 0 0  Abstraction (0/2) '2 2 2 2  '$ Delayed Recall (0/5) '4 4 3 2  '$ Orientation (0/6) '5 5 5 5  '$ Total '22 22 21 20  '$ Adjusted Score (based on education) '23  22 21     '$ Impression: This NCV/EMG study shows the following: 1. Most of the proximal muscles showed small polyphasic motor units that recruited normally. This could be consistent with a mild myopathy. 2. Although the peroneal motor responses had reduced  amplitudes, muscles innervated by that nerve did not show a neuropathic pattern. Other motor and sensory nerves were essentially normal and this is likely to be an incidental finding rather than an indicator of neuropathy 3.   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  Brain parenchyma appeared normal.  There were no acute findings.  CT scan lumbar spine 01/06/2019 showed prior fusion and posterior decompression at the L4-5 level with incorporation within the intervertebral disc spacer, however minimal incorporation across the remaining endplates at this level. Hardware is intact without evidence of loosening.   Probable mild canal stenosis at the L3-4 level.  L4-5 far lateral disc material appears to contact the exited left L4 nerve root. . Central disc protrusion at L5-S1 without evidence of canal stenosis.  MRI of the lumbar spine 09/11/2005 showed mild spinal stenosis at L4-L5 due to disc bulging, facet hypertrophy and ligamenta flava hypertrophy.  There was lateral recess stenosis.  L5-S1 showed facet hypertrophy but no spinal stenosis or foraminal narrowing.  Other levels appeared normal.  REVIEW OF SYSTEMS: Constitutional: No fevers, chills, sweats, or change in appetite Eyes: No visual changes, double vision, eye pain Ear, nose and throat: No hearing loss, ear pain, nasal congestion, sore throat Cardiovascular: No chest pain, palpitations Respiratory:  No shortness of breath at rest or with exertion.   No wheezes GastrointestinaI: No nausea, vomiting, diarrhea, abdominal pain, fecal incontinence Genitourinary:  No dysuria, urinary retention or frequency.  No nocturia. Musculoskeletal: As above  integumentary: No rash, pruritus, skin lesions Neurological: as above Psychiatric: No  depression at this time.  No anxiety Endocrine: No palpitations, diaphoresis, change in appetite, change in weigh or increased thirst Hematologic/Lymphatic:  No anemia, purpura, petechiae. Allergic/Immunologic: No itchy/runny eyes, nasal congestion, recent allergic reactions, rashes  ALLERGIES: Allergies  Allergen Reactions   Penicillins Other (See Comments)    Yeast infection Did it involve swelling of the face/tongue/throat, SOB, or low BP? No Did it involve sudden or severe rash/hives, skin peeling, or any reaction on the inside of your mouth or nose? No Did you need to seek medical attention at a hospital or doctor's office? Yes When did it last happen? More than 5 years ago  If all above answers are "NO", may proceed with cephalosporin use.     HOME MEDICATIONS:  Current Outpatient Medications:    divalproex (DEPAKOTE) 250 MG DR tablet, Take 1 tablet (250 mg total) by mouth in the morning, at noon,  and at bedtime., Disp: 90 tablet, Rfl: 1   donepezil (ARICEPT) 5 MG tablet, Take 5 mg by mouth daily., Disp: , Rfl:    enoxaparin (LOVENOX) 60 MG/0.6ML injection, Inject 0.5 mLs (50 mg total) into the skin 2 (two) times daily., Disp: 30 mL, Rfl: 3   lactulose (CHRONULAC) 10 GM/15ML solution, Take 15 mLs (10 g total) by mouth 2 (two) times daily as needed for mild constipation. Titrate for 2-3 BM daily. Hold if loose stool., Disp: 236 mL, Rfl: 0   LORazepam (ATIVAN) 1 MG tablet, Take 1 tablet (1 mg total) by mouth at bedtime., Disp: 30 tablet, Rfl: 2   methylPREDNISolone (MEDROL DOSEPAK) 4 MG TBPK tablet, Please follow instructions per dosepak, Disp: 21 each, Rfl: 0   midodrine (PROAMATINE) 2.5 MG tablet, Take 1 tablet (2.5 mg total) by mouth 3 (three) times daily with meals., Disp: 90 tablet, Rfl: 0   omeprazole (PRILOSEC) 40 MG capsule, Take 40 mg by mouth daily as needed (acid reflux)., Disp: , Rfl:    oxycodone (OXY-IR) 5 MG capsule, Take 2 capsules (10 mg total) by mouth every 4  (four) hours as needed., Disp: 20 capsule, Rfl: 0   risperiDONE (RISPERDAL) 3 MG tablet, Take 1 tablet (3 mg total) by mouth at bedtime., Disp: 90 tablet, Rfl: 0  PAST MEDICAL HISTORY: Past Medical History:  Diagnosis Date   Anxiety    anxiety and mild depressive order systoms   Bipolar disorder ([place])     (02/20/2013)   Cervical cancer ([place]) 03/19/1980   Cholelithiasis 11/11/2021   Depression    Dysrhythmia    Headache(784.0)    "monthly" (02/20/2013)   Hyperlipidemia 11/11/2021   Pacemaker    medtronic   PONV (postoperative nausea and vomiting)    Sinus pause 02/14/2013   Syncope and collapse    echo-April 12,2012-EF 55% Echo normal; recorder with prolonged sinus pauses --> status post   Tobacco abuse    Vertigo     PAST SURGICAL HISTORY: Past Surgical History:  Procedure Laterality Date   ABDOMINAL HYSTERECTOMY  03/19/1980   "partial"   BACK SURGERY     CERVICAL FUSION Left 11/2013   C4-C6   CERVICAL FUSION     3,4,5,   INSERT / REPLACE / REMOVE PACEMAKER  02/20/2013   medtronic   LOOP RECORDER IMPLANT N/A 11/06/2012   Procedure: LINQ LOOP RECORDER IMPLANT;  Surgeon: Sanda Klein, MD;  Location: [facility];  Service: Cardiovascular;  Laterality: N/A;   NECK SURGERY  11/17/2013   PACEMAKER PLACEMENT N/A 02/16/2013   PERMANENT PACEMAKER INSERTION N/A 02/20/2013   Procedure: PERMANENT PACEMAKER INSERTION;  Surgeon: Sanda Klein, MD;  Location: [facility];  Service: Cardiovascular;  Laterality: N/A;   POSTERIOR LUMBAR FUSION  ~ 2009   ROBOTIC ASSISTED SALPINGO OOPHERECTOMY Bilateral 11/25/2018   Procedure: XI ROBOTIC ASSISTED BILATERAL SALPINGO OOPHORECTOMY and LYSIS OF ADHESIONS.;  Surgeon: Everitt Amber, MD;  Location: [facility];  Service: Gynecology;  Laterality: Bilateral;   SPINAL FUSION  03/19/2014   TONSILLECTOMY     TRANSTHORACIC ECHOCARDIOGRAM  07/11/2010   The left atrial size is normal.There is no evidence of mitral vavle prolaspe.Right ventriicular systolic pressure  is normal . Injection of contrast documented no interatrial shunt. Essentially normal 2D echo -doppler study     FAMILY HISTORY: Family History  Adopted: Yes  Problem Relation Age of Onset   Heart attack Brother     SOCIAL HISTORY:  Social History   Socioeconomic History  Marital status: Widowed    Spouse name: Not on file   Number of children: 3   Years of education: 60   Highest education level: Not on file  Occupational History   Not on file  Tobacco Use   Smoking status: Every Day    Packs/day: 0.50    Years: 40.00    Total pack years: 20.00    Types: Cigarettes   Smokeless tobacco: Never  Vaping Use   Vaping Use: Never used  Substance and Sexual Activity   Alcohol use: No   Drug use: No   Sexual activity: Yes  Other Topics Concern   Not on file  Social History Narrative   Married 29 years . Has 3 children. Current smoker -1 pack a day-for 36 years .Alcohol :  1 beer on occasion.   She is soon to be a grandmother.  His before to going on a trip up to the [place] area to be closer to family when the baby is born.  She is extremely excited.   She very much would like to move back up to that area to be closer to family, but the whether is it just too cold in the winter.   Right handed    Soda daily   Lives alone   Social Determinants of Health   Financial Resource Strain: Low Risk  (10/11/2020)   Overall Financial Resource Strain (CARDIA)    Difficulty of Paying Living Expenses: Not hard at all  Food Insecurity: No Food Insecurity (11/22/2021)   Hunger Vital Sign    Worried About Running Out of Food in the Last Year: Never true    Ran Out of Food in the Last Year: Never true  Transportation Needs: No Transportation Needs (11/22/2021)   PRAPARE - Hydrologist (Medical): No    Lack of Transportation (Non-Medical): No  Physical Activity: Not on file  Stress: No Stress Concern Present (10/11/2020)   [place]    Feeling of Stress : Only a little  Social Connections: Not on file  Intimate Partner Violence: Not At Risk (11/22/2021)   Humiliation, Afraid, Rape, and Kick questionnaire    Fear of Current or Ex-Partner: No    Emotionally Abused: No    Physically Abused: No    Sexually Abused: No     PHYSICAL EXAM  Vitals:   05/28/22 1435 05/28/22 1438  BP: (!) 153/92 (!) 141/98  Pulse: 90 88  Weight: 111 lb (50.3 kg)   Height: '5\' 7"'$  (1.702 m)     Body mass index is 17.39 kg/m.   General: The patient is well-developed and well-nourished and in no acute distress  HEENT:  Head is [place]/AT.  Sclera are anicteric.  Neck/heart: No carotid artery bruits.  The neck is nontender with good range of motion.  The heart rate is regular.  No murmurs gallops or rubs.  Skin: Extremities are without rash or  edema.   Neurologic Exam  Mental status: Alert and oriented.  Reduced memory and focus.   Cranial nerves: Extraocular movements are full. There is good facial sensation to soft touch bilaterally.Facial strength is normal. No obvious hearing deficits are noted.  Motor:  Muscle bulk is normal.   Tone is normal.  No cogwheeling.  Strength is  5 / 5 in all 4 extremities except 4+/5 right EHL.     Sensory: She has mild reduced vibration in the  feet relative to the knees.  Sensation was normal in the arms.  Coordination: Cerebellar testing reveals good finger-nose-finger and heel-to-shin bilaterally.  Gait and station: Station is normal.  She does not need support to walk.  The stride was mildly reduced and her turn was off balance.  There was no retropulsion.  Romberg is negative.   Reflexes: Deep tendon reflexes are symmetric and normal bilaterally.       DIAGNOSTIC DATA (LABS, IMAGING, TESTING) - I reviewed patient records, labs, notes, testing and imaging myself where available.  Lab Results  Component Value Date   WBC 6.6 04/30/2022    HGB 12.2 04/30/2022   HCT 36.4 04/30/2022   MCV 92.2 04/30/2022   PLT 196 04/30/2022      Component Value Date/Time   NA 141 04/30/2022 1422   NA 139 01/04/2020 1147   K 3.8 04/30/2022 1422   CL 108 04/30/2022 1422   CO2 22 04/30/2022 1422   GLUCOSE 92 04/30/2022 1422   BUN 13 04/30/2022 1422   BUN 11 01/04/2020 1147   CREATININE 0.91 04/30/2022 1422   CREATININE 0.84 03/13/2022 1441   CREATININE 1.08 (H) 09/24/2018 1019   CALCIUM 9.0 04/30/2022 1422   PROT 5.5 (L) 04/30/2022 1422   PROT 6.6 01/04/2020 1147   ALBUMIN 3.6 04/30/2022 1422   ALBUMIN 3.4 (L) 02/25/2020 0910   AST 11 (L) 04/30/2022 1422   AST 16 03/13/2022 1441   ALT 6 04/30/2022 1422   ALT 17 03/13/2022 1441   ALKPHOS 39 04/30/2022 1422   BILITOT 0.6 04/30/2022 1422   BILITOT 0.3 03/13/2022 1441   GFRNONAA >60 04/30/2022 1422   GFRNONAA >60 03/13/2022 1441   GFRNONAA 56 (L) 09/24/2018 1019   GFRAA 69 01/04/2020 1147   GFRAA 65 09/24/2018 1019   Lab Results  Component Value Date   CHOL 256 (H) 09/24/2018   HDL 50 09/24/2018   LDLCALC 179 (H) 09/24/2018   TRIG 130 09/24/2018   CHOLHDL 5.1 (H) 09/24/2018       ASSESSMENT AND PLAN  Antiphospholipid syndrome (HCC)  Memory loss  Dural venous sinus thrombosis  Insomnia, unspecified type  Gait disturbance  1.  She is currently on Lovenox for dural sinus venous thrombosis.     She has APL syndrome and has an appt to see Rheum.   2.  continue Aricept for now but if cognition improves further we could consider discontinuing.   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  Labs not c/w Alzheimer and LBD unlikely 3.  Continue Risperdol and I will add Ativan at night only (I'll avoid gabapentin as combination may have led to more confusion).     4.  She will return to see [place] in 3 months or sooner if there are new or worsening neurologic symptoms.      [name] A. Felecia Shelling, MD, [facility] [id number], [id number] PM Certified in Neurology, Clinical Neurophysiology, Sleep Medicine and  Neuroimaging  [facility] [address], [place], [place] [address] [phone]

## 2022-05-29 NOTE — Telephone Encounter (Signed)
Called and relayed Dr. Garth Bigness message. Pt verbalized understanding and appreciation.

## 2022-06-08 ENCOUNTER — Encounter: Payer: Self-pay | Admitting: Physician Assistant

## 2022-06-08 ENCOUNTER — Ambulatory Visit (INDEPENDENT_AMBULATORY_CARE_PROVIDER_SITE_OTHER): Payer: 59 | Admitting: Physician Assistant

## 2022-06-08 DIAGNOSIS — G4700 Insomnia, unspecified: Secondary | ICD-10-CM

## 2022-06-08 DIAGNOSIS — F17200 Nicotine dependence, unspecified, uncomplicated: Secondary | ICD-10-CM

## 2022-06-08 DIAGNOSIS — Z87898 Personal history of other specified conditions: Secondary | ICD-10-CM

## 2022-06-08 DIAGNOSIS — Z86711 Personal history of pulmonary embolism: Secondary | ICD-10-CM

## 2022-06-08 DIAGNOSIS — F411 Generalized anxiety disorder: Secondary | ICD-10-CM

## 2022-06-08 DIAGNOSIS — Z86718 Personal history of other venous thrombosis and embolism: Secondary | ICD-10-CM

## 2022-06-08 DIAGNOSIS — R296 Repeated falls: Secondary | ICD-10-CM

## 2022-06-08 DIAGNOSIS — R4182 Altered mental status, unspecified: Secondary | ICD-10-CM

## 2022-06-08 DIAGNOSIS — F319 Bipolar disorder, unspecified: Secondary | ICD-10-CM

## 2022-06-08 DIAGNOSIS — Z79899 Other long term (current) drug therapy: Secondary | ICD-10-CM

## 2022-06-08 MED ORDER — RISPERIDONE 2 MG PO TABS: 2.0000 mg | ORAL_TABLET | Freq: Two times a day (BID) | ORAL | 1 refills | Status: DC

## 2022-06-08 NOTE — Progress Notes (Unsigned)
[facility]  Patient ID: [name],  MRN: [id number]  PCP: Lujean Amel, MD  Date of Evaluation: 06/08/2022 Time spent:30 minutes  Chief Complaint:  Chief Complaint   Anxiety; Depression; Follow-up    Virtual Visit via Telehealth  I connected with patient by telephone, with their informed consent, and verified patient privacy and that I am speaking with the correct person using two identifiers.  I am private, in my office and the patient is at home.  I discussed the limitations, risks, security and privacy concerns of performing an evaluation and management service by telephone and the availability of in person appointments. I also discussed with the patient that there may be a patient responsible charge related to this service. The patient expressed understanding and agreed to proceed.   I discussed the assessment and treatment plan with the patient. The patient was provided an opportunity to ask questions and all were answered. The patient agreed with the plan and demonstrated an understanding of the instructions.   The patient was advised to call back or seek an in-person evaluation if the symptoms worsen or if the condition fails to improve as anticipated.  I provided 30 minutes of non-face-to-face time during this encounter.  HISTORY/CURRENT STATUS: HPI for routine med check.   Since our visit 04/27/2022 she went to the ER by EMS, 04/30/2022.  She had altered mental status.  Neurology was consulted, CTA did not show recurrent thrombosis.  Neurology felt that her symptoms were likely from over sedation from polypharmacy.  She was sent home on home health care.    Received a phone call 05/01/2022 stating that the doctor said gabapentin could cause this issue.  Patient requested a medication similar to gabapentin.  She was notified by Bonney Leitz, CMA to wean off gabapentin, I recommend stopping the Valium and hydroxyzine, she should discuss with neurology on the  Valium since Dr. Felecia Shelling prescribes that at bedtime only.  I will not send in any new medication for anxiety at least until the current problem is resolved.  Message given to patient through Shelton Silvas letting her know that most all drugs for anxiety can be sedating, can cause confusion and falls which she has a history of.  That can lead to serious injury including hip fractures, which can lead to death.    She then called 29-May-2022 stating she was having motor tics.  Hands are shaking, hard to eat, her head is bobbing, going on for about a month.  Recommended to restart gabapentin but only at 100 mg, hopefully to help the tremor, patient reported the tremor was happening when she was on gabapentin, therefore it was not restarted.  It took several days to get in touch with her.  Patient reported symptoms were better.  She called again 05/25/2022 asking for a medication to help with the shaking.  Referred back to neurology.  Today she reports being anxious all the time.  Has a panic attack that "last all day every day."  Associated with shortness of breath sometimes but mostly she feels that something bad is going to happen and occasionally her whole body will shake when the anxiety is at its worst.  She begs for a prescription for Xanax, telling me that I do not understand how anxious she is and that she needs that drug.  "It is the only thing that has ever helped."  She has a hard time falling asleep because of the anxiety, ruminating thoughts.  She is not getting  enough rest.  States she is not depressed, but she does not enjoy much of anything more so due to her physical health.  She has fair energy and motivation on most days.  She does not cry easily.  She does not have feelings of hopelessness except for not being treated for the anxiety.  "I do not understand why you will not give me the Xanax."  ADLs are limited due to physical illness.  Personal hygiene is normal.  She still has problems remembering things  like what she went into a room for.  Appetite has not changed.  No suicidal or homicidal thoughts.  Patient denies increased energy with decreased need for sleep, increased talkativeness, racing thoughts, impulsivity or risky behaviors, increased spending, increased libido, grandiosity, increased irritability or anger, paranoia, or hallucinations.  Review of Systems  Constitutional:  Positive for malaise/fatigue.  HENT: Negative.    Eyes: Negative.   Respiratory:  Positive for shortness of breath. Negative for cough, hemoptysis, sputum production and wheezing.        When anxiety is worse, see HPI.   Cardiovascular: Negative.   Gastrointestinal: Negative.   Genitourinary: Negative.   Musculoskeletal: Negative.   Skin: Negative.   Neurological:  Positive for dizziness and tremors. Negative for tingling, sensory change, speech change, focal weakness, seizures, loss of consciousness, weakness and headaches.       See HPI  Endo/Heme/Allergies: Negative.   Psychiatric/Behavioral:  Positive for memory loss. Negative for depression, hallucinations, substance abuse and suicidal ideas. The patient is nervous/anxious and has insomnia.        See HPI   Individual Medical History/ Review of Systems: Changes? :Yes    see notes on chart  Past medications for mental health diagnoses include: Trazodone, Belsomra, Risperdal, Zoloft, hydroxyzine, mirtazapine, Xanax, Valium, Wellbutrin, Abilify, gabapentin, Strattera, Vyvanse, Ambien, Lunesta, Dayvigo didn't help, Latuda, Quviviq, Sonata, Elavil (might have been associated w/ orthostatic hypotension) Aricept she stopped on her own because it was not working  Allergies: Penicillins  Current Medications:  Current Outpatient Medications:    divalproex (DEPAKOTE) 250 MG DR tablet, Take 1 tablet (250 mg total) by mouth in the morning, at noon, and at bedtime., Disp: 90 tablet, Rfl: 1   enoxaparin (LOVENOX) 60 MG/0.6ML injection, Inject 0.5 mLs (50 mg total)  into the skin 2 (two) times daily., Disp: 30 mL, Rfl: 3   LORazepam (ATIVAN) 1 MG tablet, Take 1 tablet (1 mg total) by mouth at bedtime., Disp: 30 tablet, Rfl: 2   midodrine (PROAMATINE) 2.5 MG tablet, Take 1 tablet (2.5 mg total) by mouth 3 (three) times daily with meals., Disp: 90 tablet, Rfl: 0   omeprazole (PRILOSEC) 40 MG capsule, Take 40 mg by mouth daily as needed (acid reflux)., Disp: , Rfl:    risperiDONE (RISPERDAL) 2 MG tablet, Take 1 tablet (2 mg total) by mouth 2 (two) times daily., Disp: 60 tablet, Rfl: 1   donepezil (ARICEPT) 5 MG tablet, Take 5 mg by mouth daily. (Patient not taking: Reported on 06/08/2022), Disp: , Rfl:    lactulose (CHRONULAC) 10 GM/15ML solution, Take 15 mLs (10 g total) by mouth 2 (two) times daily as needed for mild constipation. Titrate for 2-3 BM daily. Hold if loose stool. (Patient not taking: Reported on 06/08/2022), Disp: 236 mL, Rfl: 0   oxycodone (OXY-IR) 5 MG capsule, Take 2 capsules (10 mg total) by mouth every 4 (four) hours as needed. (Patient not taking: Reported on 06/08/2022), Disp: 20 capsule, Rfl: 0 Medication Side  Effects: none  Family Medical/ Social History: Changes? No  MENTAL HEALTH EXAM:  There were no vitals taken for this visit.There is no height or weight on file to calculate BMI.  General Appearance:  unable to assess  Eye Contact:   unable to assess  Speech:  Clear and Coherent and Normal Rate  Volume:  Increased  Mood:  Angry and Irritable  Affect:   unable to assess  Thought Process:  Goal Directed and Descriptions of Associations: Circumstantial  Orientation:  Full (Time, Place, and Person)  Thought Content: Illogical   Suicidal Thoughts:  No  Homicidal Thoughts:  No  Memory:   Fair  Judgement:  Poor  Insight:  Lacking  Psychomotor Activity:   unable to assess  Concentration:  Concentration: Good  Recall:  [employer] of Knowledge: Fair  Language: Fair  Assets:  Neurosurgeon   ADL's:  Intact  Cognition: WNL  Prognosis:  Poor   DIAGNOSES:    ICD-10-CM   1. Generalized anxiety disorder  F41.1     2. History of benzodiazepine use  Z87.898     3. Insomnia, unspecified type  G47.00     4. Bipolar I disorder ([place])  F31.9     5. Smoker  F17.200     6. Polypharmacy  Z79.899     7. Altered mental status, unspecified altered mental status type  R41.82     8. Frequent falls  R29.6     9. History of pulmonary embolism  Z86.711     10. History of DVT (deep vein thrombosis)  Z86.718       Receiving Psychotherapy: No   RECOMMENDATIONS:  PDMP was reviewed.  Ativan filled 05/28/2022 prescribed by neurology.  Valium filled 04/30/2022 by neurology.  Gabapentin to 12/07/2022 prescribed by me.  Oxycodone filled 04/17/2022 by Benedetto Goad, NP. I provided 30 minutes of non-face-to-face time during this encounter, including time spent before and after the visit in records review, medical decision making, counseling pertinent to today's visit, and charting.   While I don't doubt she needs something for the anxiety, I reminded her that a benzodiazepine is not safe under the circumstances and I will not prescribe Xanax.  She has had confusion on several occasions which required hospitalizations, has had several falls, and adding any benzodiazepine back in with her medications will increase those risks.  She has a history of DVT, pulmonary embolus, and jugular vein thrombosis.  She is on an anticoagulant, Lovenox, daily.  If she were to fall she has increased risk of bleeding which could be life-threatening.  Any fall can lead to a fracture which can be debilitating, and can cause a cascading effect of infection including pneumonia, sepsis, and death.  She got extremely angry, raised her voice and told me I don't know what I'm doing and she doesn't know if she should find another dr or not.  I reminded her there are other ways to treat anxiety than using Xanax.  I recommend  increasing the Risperdal, benefits, risk and side effects were given.  She was also told that it can cause sedation but is not considered as much of a risk as any benzodiazepine is.  She is not happy with this and states she may try to find another doctor.  She was given the names of [facility] and [facility], if she chooses to see another provider.  At her request and signed release, we can send records to  whomever she chooses.  Recommend stopping Valium, she'll disc w neuro.  Continue Depakote DR 250 mg, 1 po tid. (for seizure) Increased risk Bedol to 2 mg, 1 p.o. twice daily (from 3 mg nightly.) Recommend multivitamin, B complex, vitamin D, fish oil. Strongly recommend counseling. Return in 6 weeks.  Donnal Moat, PA-C

## 2022-06-14 ENCOUNTER — Other Ambulatory Visit: Payer: Self-pay | Admitting: Physician Assistant

## 2022-06-14 DIAGNOSIS — G4700 Insomnia, unspecified: Secondary | ICD-10-CM

## 2022-06-14 DIAGNOSIS — F411 Generalized anxiety disorder: Secondary | ICD-10-CM

## 2022-06-14 DIAGNOSIS — G4709 Other insomnia: Secondary | ICD-10-CM

## 2022-06-29 DIAGNOSIS — R051 Acute cough: Secondary | ICD-10-CM | POA: Diagnosis not present

## 2022-06-29 DIAGNOSIS — J069 Acute upper respiratory infection, unspecified: Secondary | ICD-10-CM | POA: Diagnosis not present

## 2022-07-01 ENCOUNTER — Other Ambulatory Visit: Payer: Self-pay | Admitting: Physician Assistant

## 2022-07-09 ENCOUNTER — Other Ambulatory Visit: Payer: Self-pay | Admitting: Physician Assistant

## 2022-07-09 DIAGNOSIS — I8290 Acute embolism and thrombosis of unspecified vein: Secondary | ICD-10-CM | POA: Diagnosis not present

## 2022-07-14 DIAGNOSIS — M47812 Spondylosis without myelopathy or radiculopathy, cervical region: Secondary | ICD-10-CM | POA: Diagnosis not present

## 2022-07-14 DIAGNOSIS — M961 Postlaminectomy syndrome, not elsewhere classified: Secondary | ICD-10-CM | POA: Diagnosis not present

## 2022-07-14 DIAGNOSIS — M5459 Other low back pain: Secondary | ICD-10-CM | POA: Diagnosis not present

## 2022-07-14 DIAGNOSIS — M542 Cervicalgia: Secondary | ICD-10-CM | POA: Diagnosis not present

## 2022-07-19 ENCOUNTER — Ambulatory Visit (INDEPENDENT_AMBULATORY_CARE_PROVIDER_SITE_OTHER): Payer: 59 | Admitting: Physician Assistant

## 2022-07-19 ENCOUNTER — Encounter: Payer: Self-pay | Admitting: Physician Assistant

## 2022-07-19 DIAGNOSIS — F411 Generalized anxiety disorder: Secondary | ICD-10-CM | POA: Diagnosis not present

## 2022-07-19 DIAGNOSIS — Z79899 Other long term (current) drug therapy: Secondary | ICD-10-CM

## 2022-07-19 DIAGNOSIS — F17200 Nicotine dependence, unspecified, uncomplicated: Secondary | ICD-10-CM

## 2022-07-19 DIAGNOSIS — Z86711 Personal history of pulmonary embolism: Secondary | ICD-10-CM

## 2022-07-19 DIAGNOSIS — F99 Mental disorder, not otherwise specified: Secondary | ICD-10-CM

## 2022-07-19 DIAGNOSIS — F5105 Insomnia due to other mental disorder: Secondary | ICD-10-CM | POA: Diagnosis not present

## 2022-07-19 DIAGNOSIS — R251 Tremor, unspecified: Secondary | ICD-10-CM

## 2022-07-19 DIAGNOSIS — F319 Bipolar disorder, unspecified: Secondary | ICD-10-CM

## 2022-07-19 DIAGNOSIS — Z86718 Personal history of other venous thrombosis and embolism: Secondary | ICD-10-CM

## 2022-07-19 MED ORDER — RISPERIDONE 2 MG PO TABS: 2.0000 mg | ORAL_TABLET | Freq: Two times a day (BID) | ORAL | 1 refills | Status: DC

## 2022-07-19 NOTE — Progress Notes (Signed)
[facility]  Patient ID: [name],  MRN: [id number]  PCP: Darrow Bussing, MD  Date of Evaluation: 07/19/2022 Time spent:30 minutes  Chief Complaint:  Chief Complaint   Anxiety; Depression; Follow-up     HISTORY/CURRENT STATUS: HPI for routine med check.   Increasing Risperdol 6 weeks ago hasn't helped anxiety. But mood is stable. Energy and motivation are fair, related to her physical issues.  She doesn't work.   No extreme sadness, tearfulness, or feelings of hopelessness.  Sleeps fairly well.  ADLs and personal hygiene are normal.   Denies any changes in concentration, making decisions, or remembering things.  Appetite has not changed.  Weight is stable. Says the anxiety is 'better.'  Didn't elaborate.  Denies suicidal or homicidal thoughts.  Patient denies increased energy with decreased need for sleep, increased talkativeness, racing thoughts, impulsivity or risky behaviors, increased spending, increased libido, grandiosity, increased irritability or anger, paranoia, or hallucinations.  Still c/o tremor in hands. Hard time eating b/c can't keep food on utensil. Tremor is at rest and with outstretched arms. Denies dizziness, syncope, seizures, numbness, tingling, tics, unsteady gait, slurred speech, confusion. Denies muscle or joint pain, stiffness, or dystonia. No abnormal mouth movements, truncal movements or any signs of TD.  Denies unexplained weight loss, frequent infections, or sores that heal slowly.  No polyphagia, polydipsia, or polyuria. Denies visual changes or paresthesias.   Individual Medical History/ Review of Systems: Changes? :No      Past medications for mental health diagnoses include: Trazodone, Belsomra, Risperdal, Zoloft, hydroxyzine, mirtazapine, Xanax, Valium, Wellbutrin, Abilify, gabapentin, Strattera, Vyvanse, Ambien, Lunesta, Dayvigo didn't help, Latuda, Quviviq, Sonata, Elavil (might have been associated w/ orthostatic hypotension) Aricept  she stopped on her own because it was not working  Allergies: Penicillins  Current Medications:  Current Outpatient Medications:    divalproex (DEPAKOTE) 250 MG DR tablet, TAKE 1 TABLET (250 MG TOTAL) BY MOUTH IN THE MORNING, AT NOON, AND AT BEDTIME., Disp: 90 tablet, Rfl: 0   enoxaparin (LOVENOX) 60 MG/0.6ML injection, Inject 0.5 mLs (50 mg total) into the skin 2 (two) times daily., Disp: 30 mL, Rfl: 3   LORazepam (ATIVAN) 1 MG tablet, Take 1 tablet (1 mg total) by mouth at bedtime., Disp: 30 tablet, Rfl: 2   midodrine (PROAMATINE) 2.5 MG tablet, Take 1 tablet (2.5 mg total) by mouth 3 (three) times daily with meals., Disp: 90 tablet, Rfl: 0   omeprazole (PRILOSEC) 40 MG capsule, Take 40 mg by mouth daily as needed (acid reflux)., Disp: , Rfl:    donepezil (ARICEPT) 5 MG tablet, Take 5 mg by mouth daily. (Patient not taking: Reported on 06/08/2022), Disp: , Rfl:    lactulose (CHRONULAC) 10 GM/15ML solution, Take 15 mLs (10 g total) by mouth 2 (two) times daily as needed for mild constipation. Titrate for 2-3 BM daily. Hold if loose stool. (Patient not taking: Reported on 06/08/2022), Disp: 236 mL, Rfl: 0   oxycodone (OXY-IR) 5 MG capsule, Take 2 capsules (10 mg total) by mouth every 4 (four) hours as needed. (Patient not taking: Reported on 06/08/2022), Disp: 20 capsule, Rfl: 0   risperiDONE (RISPERDAL) 2 MG tablet, Take 1 tablet (2 mg total) by mouth 2 (two) times daily., Disp: 60 tablet, Rfl: 1 Medication Side Effects: none  Family Medical/ Social History: Changes? No  MENTAL HEALTH EXAM:  There were no vitals taken for this visit.There is no height or weight on file to calculate BMI.  General Appearance: Casual and Well Groomed  Eye Contact:  Good  Speech:  Clear and Coherent and Normal Rate  Volume:  Increased  Mood:  Euthymic  Affect:  Congruent  Thought Process:  Goal Directed and Descriptions of Associations: Circumstantial  Orientation:  Full (Time, Place, and Person)  Thought  Content: Illogical   Suicidal Thoughts:  No  Homicidal Thoughts:  No  Memory:   Fair  Judgement:  Poor  Insight:  Lacking  Psychomotor Activity:   mild fine motor tremor bilat hands with outstretched arms. Not pill rolling.   Concentration:  Concentration: Good  Recall:  [employer] of Knowledge: Fair  Language: Fair  Assets:  Interior and spatial designer  ADL's:  Intact  Cognition: WNL  Prognosis:  Poor   DIAGNOSES:    ICD-10-CM   1. Generalized anxiety disorder  F41.1     2. Tremor  R25.1     3. Bipolar I disorder ([facility])  F31.9     4. Insomnia due to other mental disorder  F51.05    F99     5. History of pulmonary embolism  Z86.711     6. History of DVT (deep vein thrombosis)  Z86.718     7. Smoker  F17.200     8. Polypharmacy  Z79.899       Receiving Psychotherapy: No   RECOMMENDATIONS:  PDMP was reviewed.  Ativan filled 06/28/2022 prescribed by neurology.  Valium filled 06/08/2022 by neurology.  Gabapentin 04/27/2022 prescribed by me.  Oxycodone filled 07/12/2022. I provided 30 minutes of face to face time during this encounter, including time spent before and after the visit in records review, medical decision making, counseling pertinent to today's visit, and charting.   Discussed the tremor. This started after hospitalization in Feb, not sure of cause. I don't think the increased dose of Risperdal is a factor b/c it was happening prior to the increase and didn't worsen.  I'll consult w/ Dr. Epimenio Foot, neurology about this.   She didn't bring up the anxiety or ask for xanax again, I didn't bring it up. I think the anxiety must be some better after increasing the Risperdal or she would certainly tell me.   No changes in meds for now.   Smoking cessation discussed. She doesn't want to quit.   Continue Depakote DR 250 mg, 1 po tid. (for seizure) Continue Ativan 1 mg qhs per neuro.  Continue risperidone 2 mg, 1 p.o. twice daily Recommend  multivitamin, B complex, vitamin D, fish oil. Strongly recommend counseling. Return appointment TBD after discussing with Dr. Epimenio Foot.   Melony Overly, PA-C

## 2022-07-23 ENCOUNTER — Telehealth: Payer: Self-pay | Admitting: Physician Assistant

## 2022-07-23 ENCOUNTER — Other Ambulatory Visit: Payer: Self-pay | Admitting: Physician Assistant

## 2022-07-23 MED ORDER — PROPRANOLOL HCL 10 MG PO TABS: 10.0000 mg | ORAL_TABLET | Freq: Two times a day (BID) | ORAL | 1 refills | Status: DC

## 2022-07-23 NOTE — Telephone Encounter (Signed)
Please let her know I've discussed the tremor with Dr. Epimenio Foot. I'll prescribe Propranolol for the tremor, starting at the very lowest dose, take twice daily.  If she starts getting dizzy, especially when she gets out of bed or stands from a sitting position, stop the medication and let me know.  It is unlikely that this will occur at such a low dose but give orthostatic precautions please.  I sent the prescription to [facility] [address].  I have her make an appointment with me in 1 month.  Thanks.

## 2022-07-23 NOTE — Progress Notes (Signed)
Rx for Propranolol sent to [facility], [address]

## 2022-07-24 NOTE — Telephone Encounter (Signed)
LVM to RC 

## 2022-07-24 NOTE — Telephone Encounter (Signed)
Patient informed of the Rx and recommendations.

## 2022-08-03 ENCOUNTER — Other Ambulatory Visit: Payer: Self-pay | Admitting: Physician Assistant

## 2022-08-08 ENCOUNTER — Ambulatory Visit: Payer: Medicare Other | Admitting: Neurology

## 2022-08-16 ENCOUNTER — Telehealth: Payer: Self-pay | Admitting: Hematology and Oncology

## 2022-08-16 NOTE — Telephone Encounter (Signed)
Patient is aware of appointments, responded to voicamail made on 5/27

## 2022-08-18 ENCOUNTER — Other Ambulatory Visit: Payer: Self-pay | Admitting: Physician Assistant

## 2022-08-27 DIAGNOSIS — I2699 Other pulmonary embolism without acute cor pulmonale: Secondary | ICD-10-CM | POA: Diagnosis not present

## 2022-08-27 DIAGNOSIS — I951 Orthostatic hypotension: Secondary | ICD-10-CM | POA: Diagnosis not present

## 2022-08-27 DIAGNOSIS — D6861 Antiphospholipid syndrome: Secondary | ICD-10-CM | POA: Diagnosis not present

## 2022-08-27 DIAGNOSIS — R809 Proteinuria, unspecified: Secondary | ICD-10-CM | POA: Diagnosis not present

## 2022-08-27 DIAGNOSIS — G08 Intracranial and intraspinal phlebitis and thrombophlebitis: Secondary | ICD-10-CM | POA: Diagnosis not present

## 2022-08-28 ENCOUNTER — Other Ambulatory Visit: Payer: Self-pay | Admitting: Physician Assistant

## 2022-08-28 DIAGNOSIS — F17210 Nicotine dependence, cigarettes, uncomplicated: Secondary | ICD-10-CM | POA: Diagnosis not present

## 2022-08-28 DIAGNOSIS — K219 Gastro-esophageal reflux disease without esophagitis: Secondary | ICD-10-CM | POA: Diagnosis not present

## 2022-08-29 ENCOUNTER — Other Ambulatory Visit: Payer: Self-pay | Admitting: Neurology

## 2022-08-30 ENCOUNTER — Other Ambulatory Visit: Payer: Self-pay | Admitting: Physician Assistant

## 2022-08-30 NOTE — Telephone Encounter (Signed)
Requested Prescriptions   Pending Prescriptions Disp Refills   LORazepam (ATIVAN) 1 MG tablet [Pharmacy Med Name: LORAZEPAM 1 MG TABLET] 30 tablet 2    Sig: TAKE 1 TABLET BY MOUTH AT BEDTIME.   Last seen 05/28/22, next appt scheduled 12/12/22 Dispenses   Dispensed Days Supply Quantity Provider Pharmacy  LORAZEPAM 1 MG TABLET 08/02/2022 30 30 each Asa Lente, MD [facility] [phone] - [facility]...  LORAZEPAM 1 MG TABLET 06/28/2022 30 30 each Sater, Pearletha Furl, MD [facility] [phone] - [facility]...  LORAZEPAM 1 MG TABLET 05/28/2022 30 30 each Sater, Pearletha Furl, MD [facility] [phone] - [facility].Marland KitchenMarland Kitchen

## 2022-09-05 ENCOUNTER — Ambulatory Visit (INDEPENDENT_AMBULATORY_CARE_PROVIDER_SITE_OTHER): Payer: 59 | Admitting: Physician Assistant

## 2022-09-05 ENCOUNTER — Encounter: Payer: Self-pay | Admitting: Physician Assistant

## 2022-09-05 DIAGNOSIS — F411 Generalized anxiety disorder: Secondary | ICD-10-CM

## 2022-09-05 DIAGNOSIS — F319 Bipolar disorder, unspecified: Secondary | ICD-10-CM

## 2022-09-05 DIAGNOSIS — F99 Mental disorder, not otherwise specified: Secondary | ICD-10-CM

## 2022-09-05 DIAGNOSIS — F5105 Insomnia due to other mental disorder: Secondary | ICD-10-CM | POA: Diagnosis not present

## 2022-09-05 NOTE — Progress Notes (Signed)
[facility]  Patient ID: [name],  MRN: [id number]  PCP: Darrow Bussing, MD  Date of Evaluation: 09/05/2022 Time spent: 29 minutes  Chief Complaint:  Chief Complaint   Anxiety; Depression; Follow-up   Virtual Visit via Telehealth  I connected with patient by telephone, with their informed consent, and verified patient privacy and that I am speaking with the correct person using two identifiers.  I am private, in my office and the patient is at home.  I discussed the limitations, risks, security and privacy concerns of performing an evaluation and management service by phone and the availability of in person appointments. I also discussed with the patient that there may be a patient responsible charge related to this service. The patient expressed understanding and agreed to proceed.   I discussed the assessment and treatment plan with the patient. The patient was provided an opportunity to ask questions and all were answered. The patient agreed with the plan and demonstrated an understanding of the instructions.   The patient was advised to call back or seek an in-person evaluation if the symptoms worsen or if the condition fails to improve as anticipated.  I provided 29  minutes of non-face-to-face time during this encounter.  HISTORY/CURRENT STATUS: HPI for routine med check.   Patient still complains of anxiety, not having panic attacks but is generally anxious, she did not like the way the propranolol made her feel so she stopped it.  She does not elaborate.  Just repeated that it made her feel bad.  She still has a tremor in both hands, not bad enough to prevent eating or any other ADL.  She does not do much of anything except stay home and watch TV.  She does not have the opportunity to do anything else.  Energy and motivation are fair most of the time.  Personal hygiene is normal.  Appetite is normal and weight is stable.  Not crying easily.  She still has  trouble sleeping, hard to get to sleep.  But the Ativan she is taking at night does help some.  Neurology, Dr. Epimenio Foot, prescribes that.  Denies suicidal or homicidal thoughts.  Patient denies increased energy with decreased need for sleep, increased talkativeness, racing thoughts, impulsivity or risky behaviors, increased spending, increased libido, grandiosity, increased irritability or anger, paranoia, or hallucinations.  Denies dizziness, syncope, seizures, numbness, tingling, tics, unsteady gait, slurred speech, confusion. Denies muscle or joint pain, stiffness, or dystonia.  Individual Medical History/ Review of Systems: Changes? :No      Past medications for mental health diagnoses include: Trazodone, Belsomra, Risperdal, Zoloft, hydroxyzine, mirtazapine, Xanax, Valium, Wellbutrin, Abilify, gabapentin, Strattera, Vyvanse, Ambien, Lunesta, Dayvigo didn't help, Latuda, Quviviq, Sonata, Elavil (might have been associated w/ orthostatic hypotension) Aricept she stopped on her own because it was not working  Allergies: Penicillins  Current Medications:  Current Outpatient Medications:    divalproex (DEPAKOTE) 250 MG DR tablet, TAKE 1 TABLET (250 MG TOTAL) BY MOUTH IN THE MORNING, AT NOON, AND AT BEDTIME., Disp: 90 tablet, Rfl: 0   donepezil (ARICEPT) 5 MG tablet, Take 5 mg by mouth daily., Disp: , Rfl:    enoxaparin (LOVENOX) 60 MG/0.6ML injection, INJECT 0.5 MLS (50 MG TOTAL) INTO THE SKIN 2 (TWO) TIMES DAILY., Disp: 30 mL, Rfl: 3   LORazepam (ATIVAN) 1 MG tablet, TAKE 1 TABLET BY MOUTH AT BEDTIME., Disp: 30 tablet, Rfl: 2   midodrine (PROAMATINE) 2.5 MG tablet, Take 1 tablet (2.5 mg total) by mouth 3 (  three) times daily with meals., Disp: 90 tablet, Rfl: 0   omeprazole (PRILOSEC) 40 MG capsule, Take 40 mg by mouth daily as needed (acid reflux)., Disp: , Rfl:    risperiDONE (RISPERDAL) 2 MG tablet, TAKE 1 TABLET BY MOUTH 2 TIMES DAILY., Disp: 180 tablet, Rfl: 0   lactulose (CHRONULAC) 10  GM/15ML solution, Take 15 mLs (10 g total) by mouth 2 (two) times daily as needed for mild constipation. Titrate for 2-3 BM daily. Hold if loose stool. (Patient not taking: Reported on 06/08/2022), Disp: 236 mL, Rfl: 0   oxycodone (OXY-IR) 5 MG capsule, Take 2 capsules (10 mg total) by mouth every 4 (four) hours as needed. (Patient not taking: Reported on 06/08/2022), Disp: 20 capsule, Rfl: 0 Medication Side Effects: none  Family Medical/ Social History: Changes? No  MENTAL HEALTH EXAM:  There were no vitals taken for this visit.There is no height or weight on file to calculate BMI.  General Appearance:  Unable to assess  Eye Contact:   Unable to assess  Speech:  Clear and Coherent and Normal Rate  Volume:  Normal  Mood:  Euthymic  Affect:   Unable to assess  Thought Process:  Goal Directed and Descriptions of Associations: Circumstantial  Orientation:  Full (Time, Place, and Person)  Thought Content: Logical   Suicidal Thoughts:  No  Homicidal Thoughts:  No  Memory:   Fair  Judgement:  Poor  Insight:  Lacking  Psychomotor Activity:   Unable to assess  Concentration:  Concentration: Good  Recall:  [employer] of Knowledge: Fair  Language: Fair  Assets:  Interior and spatial designer  ADL's:  Intact  Cognition: WNL  Prognosis:  Poor   PCP follows labs  DIAGNOSES:    ICD-10-CM   1. Bipolar I disorder (HCC)  F31.9     2. Generalized anxiety disorder  F41.1     3. Insomnia due to other mental disorder  F51.05    F99       Receiving Psychotherapy: No   RECOMMENDATIONS:  PDMP was reviewed.  Ativan filled 06/28/2022 prescribed by neurology.  Valium filled 06/08/2022 by neurology.  Gabapentin 04/27/2022 prescribed by me.  Oxycodone filled 07/12/2022. I provided 29 minutes of non-face to face time during this encounter, including time spent before and after the visit in records review, medical decision making, counseling pertinent to today's visit, and  charting.   She does not want to try any other medication for the tremor, "it has not too bad."  She prefers to keep things as is. I reminded her of other ways to help with anxiety, get outside, take a walk, and avoid caffeine to name a few. Sleep hygiene discussed.  Continue Depakote DR 250 mg, 1 po tid. (for seizure) Continue Ativan 1 mg qhs per neuro.  Continue risperidone 2 mg, 1 p.o. twice daily Recommend multivitamin, B complex, vitamin D, fish oil. Strongly recommend counseling. Return 6 weeks.  Melony Overly, PA-C

## 2022-09-09 ENCOUNTER — Other Ambulatory Visit: Payer: Self-pay | Admitting: Neurology

## 2022-09-14 ENCOUNTER — Other Ambulatory Visit: Payer: Self-pay | Admitting: *Deleted

## 2022-09-14 ENCOUNTER — Inpatient Hospital Stay: Payer: 59

## 2022-09-14 ENCOUNTER — Other Ambulatory Visit: Payer: Self-pay

## 2022-09-14 ENCOUNTER — Inpatient Hospital Stay: Payer: 59 | Attending: Hematology and Oncology | Admitting: Hematology and Oncology

## 2022-09-14 VITALS — BP 108/85 | HR 122 | Temp 97.3°F | Resp 17 | Wt 113.4 lb

## 2022-09-14 DIAGNOSIS — Z86718 Personal history of other venous thrombosis and embolism: Secondary | ICD-10-CM

## 2022-09-14 DIAGNOSIS — I2699 Other pulmonary embolism without acute cor pulmonale: Secondary | ICD-10-CM | POA: Diagnosis not present

## 2022-09-14 DIAGNOSIS — F17210 Nicotine dependence, cigarettes, uncomplicated: Secondary | ICD-10-CM | POA: Diagnosis not present

## 2022-09-14 DIAGNOSIS — Z86711 Personal history of pulmonary embolism: Secondary | ICD-10-CM | POA: Diagnosis not present

## 2022-09-14 DIAGNOSIS — I82409 Acute embolism and thrombosis of unspecified deep veins of unspecified lower extremity: Secondary | ICD-10-CM | POA: Insufficient documentation

## 2022-09-14 DIAGNOSIS — D6861 Antiphospholipid syndrome: Secondary | ICD-10-CM | POA: Insufficient documentation

## 2022-09-14 LAB — CBC WITH DIFFERENTIAL (CANCER CENTER ONLY)
Abs Immature Granulocytes: 0.02 10*3/uL (ref 0.00–0.07)
Basophils Absolute: 0.1 10*3/uL (ref 0.0–0.1)
Basophils Relative: 1 %
Eosinophils Absolute: 0 10*3/uL (ref 0.0–0.5)
Eosinophils Relative: 1 %
HCT: 44 % (ref 36.0–46.0)
Hemoglobin: 14.4 g/dL (ref 12.0–15.0)
Immature Granulocytes: 0 %
Lymphocytes Relative: 34 %
Lymphs Abs: 2.3 10*3/uL (ref 0.7–4.0)
MCH: 31.2 pg (ref 26.0–34.0)
MCHC: 32.7 g/dL (ref 30.0–36.0)
MCV: 95.2 fL (ref 80.0–100.0)
Monocytes Absolute: 0.5 10*3/uL (ref 0.1–1.0)
Monocytes Relative: 8 %
Neutro Abs: 3.7 10*3/uL (ref 1.7–7.7)
Neutrophils Relative %: 56 %
Platelet Count: 228 10*3/uL (ref 150–400)
RBC: 4.62 MIL/uL (ref 3.87–5.11)
RDW: 13.8 % (ref 11.5–15.5)
WBC Count: 6.6 10*3/uL (ref 4.0–10.5)
nRBC: 0 % (ref 0.0–0.2)

## 2022-09-14 LAB — CMP (CANCER CENTER ONLY)
ALT: 5 U/L (ref 0–44)
AST: 9 U/L — ABNORMAL LOW (ref 15–41)
Albumin: 4 g/dL (ref 3.5–5.0)
Alkaline Phosphatase: 53 U/L (ref 38–126)
Anion gap: 6 (ref 5–15)
BUN: 12 mg/dL (ref 8–23)
CO2: 28 mmol/L (ref 22–32)
Calcium: 10 mg/dL (ref 8.9–10.3)
Chloride: 105 mmol/L (ref 98–111)
Creatinine: 0.92 mg/dL (ref 0.44–1.00)
GFR, Estimated: >60 mL/min (ref >60)
Glucose, Bld: 105 mg/dL — ABNORMAL HIGH (ref 70–99)
Potassium: 4.5 mmol/L (ref 3.5–5.1)
Sodium: 139 mmol/L (ref 135–145)
Total Bilirubin: 0.6 mg/dL (ref 0.3–1.2)
Total Protein: 6.6 g/dL (ref 6.5–8.1)

## 2022-09-14 NOTE — Progress Notes (Unsigned)
[facility] [phone]   [phone]  PROGRESS NOTE  Patient Care Team: Darrow Bussing, MD as PCP - General (Family Medicine) Gwynneth Macleod as Physician Assistant (Psychiatry)  Hematological/Oncological History 1) 10/23/2021-11/11/2021: Admitted In [place] staying 45 seconds and witnessed by her daughter.  CT imaging revealed an acute right lobar, segmental and subsegmental pulmonary emboli.  Lower extremity Doppler was positive for DVT.  She was started on IV heparin.  Hypercoagulable testing was performed.  Findings revealed beta-2 glycoprotein IgM elevated greater than 150 and anticardiolipin antibodies IgM elevated above 103.5.Patient was discharged on Lovenox bridging to Coumadin due to concern for possible antiphospholipid syndrome.  2. 11/18/2021-11/26/2021: Readmitted at [facility] due to altered mental status and was found to have a UTI and therapeutic INR. Her INR was found to be 3.6 and subsequently 4.2 on 11/22/2021.  Patient was switched to Lovenox 1 mg/kg twice daily due to difficulty controlling her INRs.  3. 12/05/2021: Established care with [facility]  CHIEF COMPLAINTS/PURPOSE OF CONSULTATION:  "DVT and PE concerning for antiphospholipid antibody syndrome"  HISTORY OF PRESENTING ILLNESS:  Federica A Manges 67 y.o. female returns for a follow up visit.  She is unaccompanied for this visit.  Ms. Maddock reports she is currently living with her daughter and is not in a facility.  She reports that she continues her Lovenox shots twice daily.  She reports that there is some bruising on her belly but overall she tolerates them well.  She is not having any bleeding, bruising, or dark stools as result of this medication.  She reports her no issues with the cost of the medication.  She notes her energy levels are good and her appetite is strong.  She does have some occasional pain in her forehead which last for a few seconds/minutes and then  passes.  She reports that she has not had any signs or symptoms concerning for blood clot such as leg pain, leg swelling, chest pain, or shortness of breath.  Overall she is at her baseline level of health and willing and able to continue with Lovenox therapy at this time.  She denies fevers, chills, night sweats, shortness of breath, chest pain, cough, nausea, vomiting, diarrhea, constipation or peripheral edema. She has no other complaints. Rest of the 10 point ROS is below.   MEDICAL HISTORY:  Past Medical History:  Diagnosis Date   Anxiety    anxiety and mild depressive order systoms   Bipolar disorder (HCC)     (02/20/2013)   Cervical cancer (HCC) 03/19/1980   Cholelithiasis 11/11/2021   Depression    Dysrhythmia    Headache(784.0)    "monthly" (02/20/2013)   Hyperlipidemia 11/11/2021   Pacemaker    medtronic   PONV (postoperative nausea and vomiting)    Sinus pause 02/14/2013   Syncope and collapse    echo-April 12,2012-EF 55% Echo normal; recorder with prolonged sinus pauses --> status post   Tobacco abuse    Vertigo     SURGICAL HISTORY: Past Surgical History:  Procedure Laterality Date   ABDOMINAL HYSTERECTOMY  03/19/1980   "partial"   BACK SURGERY     CERVICAL FUSION Left 11/2013   C4-C6   CERVICAL FUSION     3,4,5,   INSERT / REPLACE / REMOVE PACEMAKER  02/20/2013   medtronic   LOOP RECORDER IMPLANT N/A 11/06/2012   Procedure: LINQ LOOP RECORDER IMPLANT;  Surgeon: Thurmon Fair, MD;  Location: [facility];  Service: Cardiovascular;  Laterality: N/A;   NECK SURGERY  11/17/2013   PACEMAKER PLACEMENT N/A 02/16/2013   PERMANENT PACEMAKER INSERTION N/A 02/20/2013   Procedure: PERMANENT PACEMAKER INSERTION;  Surgeon: Thurmon Fair, MD;  Location: [facility];  Service: Cardiovascular;  Laterality: N/A;   POSTERIOR LUMBAR FUSION  ~ 2009   ROBOTIC ASSISTED SALPINGO OOPHERECTOMY Bilateral 11/25/2018   Procedure: XI ROBOTIC ASSISTED BILATERAL SALPINGO OOPHORECTOMY and LYSIS OF  ADHESIONS.;  Surgeon: Adolphus Birchwood, MD;  Location: [facility];  Service: Gynecology;  Laterality: Bilateral;   SPINAL FUSION  03/19/2014   TONSILLECTOMY     TRANSTHORACIC ECHOCARDIOGRAM  07/11/2010   The left atrial size is normal.There is no evidence of mitral vavle prolaspe.Right ventriicular systolic pressure is normal . Injection of contrast documented no interatrial shunt. Essentially normal 2D echo -doppler study     SOCIAL HISTORY: Social History   Socioeconomic History   Marital status: Widowed    Spouse name: Not on file   Number of children: 3   Years of education: 21   Highest education level: Not on file  Occupational History   Not on file  Tobacco Use   Smoking status: Every Day    Packs/day: 0.50    Years: 40.00    Additional pack years: 0.00    Total pack years: 20.00    Types: Cigarettes   Smokeless tobacco: Never  Vaping Use   Vaping Use: Never used  Substance and Sexual Activity   Alcohol use: No   Drug use: No   Sexual activity: Yes  Other Topics Concern   Not on file  Social History Narrative   Married 29 years . Has 3 children. Current smoker -1 pack a day-for 36 years .Alcohol :  1 beer on occasion.   She is soon to be a grandmother.  His before to going on a trip up to the [place] area to be closer to family when the baby is born.  She is extremely excited.   She very much would like to move back up to that area to be closer to family, but the whether is it just too cold in the winter.   Right handed    Soda daily   Lives alone   Social Determinants of Health   Financial Resource Strain: Low Risk  (10/11/2020)   Overall Financial Resource Strain (CARDIA)    Difficulty of Paying Living Expenses: Not hard at all  Food Insecurity: No Food Insecurity (11/22/2021)   Hunger Vital Sign    Worried About Running Out of Food in the Last Year: Never true    Ran Out of Food in the Last Year: Never true  Transportation Needs: No Transportation Needs  (11/22/2021)   PRAPARE - Administrator, Civil Service (Medical): No    Lack of Transportation (Non-Medical): No  Physical Activity: Not on file  Stress: No Stress Concern Present (10/11/2020)   [employer] of Occupational Health - Occupational Stress Questionnaire    Feeling of Stress : Only a little  Social Connections: Not on file  Intimate Partner Violence: Not At Risk (11/22/2021)   Humiliation, Afraid, Rape, and Kick questionnaire    Fear of Current or Ex-Partner: No    Emotionally Abused: No    Physically Abused: No    Sexually Abused: No    FAMILY HISTORY: Family History  Adopted: Yes  Problem Relation Age of Onset   Heart attack Brother     ALLERGIES:  is allergic to penicillins.  MEDICATIONS:  Current Outpatient Medications  Medication Sig Dispense Refill   divalproex (DEPAKOTE) 250 MG DR tablet TAKE 1 TABLET (250 MG TOTAL) BY MOUTH IN THE MORNING, AT NOON, AND AT BEDTIME. 90 tablet 0   donepezil (ARICEPT) 5 MG tablet Take 5 mg by mouth daily.     enoxaparin (LOVENOX) 60 MG/0.6ML injection INJECT 0.5 MLS (50 MG TOTAL) INTO THE SKIN 2 (TWO) TIMES DAILY. 30 mL 3   lactulose (CHRONULAC) 10 GM/15ML solution Take 15 mLs (10 g total) by mouth 2 (two) times daily as needed for mild constipation. Titrate for 2-3 BM daily. Hold if loose stool. (Patient not taking: Reported on 06/08/2022) 236 mL 0   LORazepam (ATIVAN) 1 MG tablet TAKE 1 TABLET BY MOUTH AT BEDTIME. 30 tablet 2   midodrine (PROAMATINE) 2.5 MG tablet Take 1 tablet (2.5 mg total) by mouth 3 (three) times daily with meals. 90 tablet 0   omeprazole (PRILOSEC) 40 MG capsule Take 40 mg by mouth daily as needed (acid reflux).     oxycodone (OXY-IR) 5 MG capsule Take 2 capsules (10 mg total) by mouth every 4 (four) hours as needed. (Patient not taking: Reported on 06/08/2022) 20 capsule 0   risperiDONE (RISPERDAL) 2 MG tablet TAKE 1 TABLET BY MOUTH 2 TIMES DAILY. 180 tablet 0   No current facility-administered  medications for this visit.    REVIEW OF SYSTEMS:   Constitutional: ( - ) fevers, ( - )  chills , ( - ) night sweats Eyes: ( - ) blurriness of vision, ( - ) double vision, ( - ) watery eyes Ears, nose, mouth, throat, and face: ( - ) mucositis, ( - ) sore throat Respiratory: ( - ) cough, ( - ) dyspnea, ( - ) wheezes Cardiovascular: ( - ) palpitation, ( - ) chest discomfort, ( - ) lower extremity swelling Gastrointestinal:  ( - ) nausea, ( - ) heartburn, ( - ) change in bowel habits Skin: ( - ) abnormal skin rashes Lymphatics: ( - ) new lymphadenopathy, ( - ) easy bruising Neurological: ( - ) numbness, ( - ) tingling, ( - ) new weaknesses Behavioral/Psych: ( - ) mood change, ( - ) new changes  All other systems were reviewed with the patient and are negative.  PHYSICAL EXAMINATION: ECOG PERFORMANCE STATUS: 1 - Symptomatic but completely ambulatory  Vitals:   09/14/22 1423  BP: 108/85  Pulse: (!) 122  Resp: 17  Temp: (!) 97.3 F (36.3 C)  SpO2: 99%    Filed Weights   09/14/22 1423  Weight: 113 lb 6.4 oz (51.4 kg)     GENERAL:thin appearing female in NAD  SKIN: skin color, texture, turgor are normal, no rashes or significant lesions EYES: conjunctiva are pink and non-injected, sclera clear OROPHARYNX: no exudate, no erythema; lips, buccal mucosa, and tongue normal  NECK: supple, non-tender LYMPH:  no palpable lymphadenopathy in the cervical or supraclavicular lymph nodes.  LUNGS: clear to auscultation and percussion with normal breathing effort HEART: regular rate & rhythm and no murmurs and no lower extremity edema Musculoskeletal: no cyanosis of digits and no clubbing  PSYCH: alert & oriented x 3, fluent speech NEURO: no focal motor/sensory deficits  LABORATORY DATA:  I have reviewed the data as listed    Latest Ref Rng & Units 09/14/2022    2:04 PM 04/30/2022    2:22 PM 03/13/2022    2:41 PM  CBC  WBC 4.0 - 10.5 K/uL 6.6  6.6  8.3  Hemoglobin 12.0 - 15.0 g/dL  09.8  11.9  14.7   Hematocrit 36.0 - 46.0 % 44.0  36.4  36.7   Platelets 150 - 400 K/uL 228  196  196        Latest Ref Rng & Units 09/14/2022    2:04 PM 04/30/2022    2:22 PM 03/13/2022    2:41 PM  CMP  Glucose 70 - 99 mg/dL 829  92  562   BUN 8 - 23 mg/dL 12  13  17    Creatinine 0.44 - 1.00 mg/dL 1.30  8.65  7.84   Sodium 135 - 145 mmol/L 139  141  144   Potassium 3.5 - 5.1 mmol/L 4.5  3.8  4.4   Chloride 98 - 111 mmol/L 105  108  108   CO2 22 - 32 mmol/L 28  22  30    Calcium 8.9 - 10.3 mg/dL 69.6  9.0  9.4   Total Protein 6.5 - 8.1 g/dL 6.6  5.5  6.2   Total Bilirubin 0.3 - 1.2 mg/dL 0.6  0.6  0.3   Alkaline Phos 38 - 126 U/L 53  39  52   AST 15 - 41 U/L 9  11  16    ALT 0 - 44 U/L 5  6  17      RADIOGRAPHIC STUDIES: I have personally reviewed the radiological images as listed and agreed with the findings in the report. No results found.  ASSESSMENT & PLAN DEAUNDRIA GORDINIER  is a 67 y.o. female who presents for follow-up for recent DVT and PE, concerning for antiphospholipid syndrome.   #Recent DVT and PE #Antiphospholipid Antibody Syndrome -- Currently on Lovenox 1 mg/kg twice daily --Tolerating anticoagulation without any prohibitive toxicities including bleeding.  --DOAC therapy is not appropriate in the setting of concern for antiphospholipid antibody syndrome --Hypercoagulable testing from 10/27/2021 showed beta-2 glycoprotein IgM elevated greater than 150 and anticardiolipin antibodies IgM elevated above 103.5.Levels remained elevated in Dec 2023, confirming the diagnosis of APS -- Labs today were reviewed and require no intervention. Labs show WBC 6.6, hemoglobin 14.4, MCV 95.2, and platelets of 228.  --RTC in 6 months with labs.    No orders of the defined types were placed in this encounter.   All questions were answered. The patient knows to call the clinic with any problems, questions or concerns.  I have spent a total of 30 minutes minutes of face-to-face and  non-face-to-face time, preparing to see the patient, performing a medically appropriate examination, counseling and educating the patient,  documenting clinical information in the electronic health record,  and care coordination.   Ulysees Barns, MD Department of Hematology/Oncology [facility] Phone: [phone] Pager: [phone] Email: [email or web address]

## 2022-09-19 ENCOUNTER — Telehealth: Payer: Self-pay | Admitting: Physician Assistant

## 2022-09-19 NOTE — Telephone Encounter (Signed)
Patient said PAs are daily, they come and go. No new stressors. Rated anxiety as a 10/10. Denied depression. She said she doesn't get Ativan at all now from neuro, but per PMPD last filled 6/19.  Reports being able to get to sleep, but doesn't stay asleep.   09/05/2022 08/30/2022 2  Lorazepam 1 Mg Tablet 30.00 30 Ri Sat [id number] Nor (4868) 0/2 1.00 LME Medicare  08/29/2022 08/29/2022 2  Oxycodone Hcl (Ir) 10 Mg Tab 30.00 10 Sa Whi [id number] Nor (1914) 0/0

## 2022-09-19 NOTE — Telephone Encounter (Signed)
[name] called at 2:30 reporting that she is having severe panic attacks.  She doesn't know what to do. Please call to discuss.  Next appt 7/31

## 2022-09-21 ENCOUNTER — Encounter: Payer: Self-pay | Admitting: Hematology and Oncology

## 2022-09-21 MED ORDER — FLUVOXAMINE MALEATE 25 MG PO TABS: 25.0000 mg | ORAL_TABLET | Freq: Every day | ORAL | 0 refills | Status: DC

## 2022-09-21 NOTE — Telephone Encounter (Signed)
Options given to patient and she elects to go with Luvox. Rx sent to the requested pharmacy.

## 2022-09-21 NOTE — Telephone Encounter (Signed)
Patient reported not able to take hydroxyzine any longer.

## 2022-09-21 NOTE — Addendum Note (Signed)
Addended by: Karin Lieu T on: 09/21/2022 01:03 PM   Modules accepted: Orders

## 2022-09-21 NOTE — Telephone Encounter (Signed)
I recommend adding a med to prevent the anxiety.  Buspar would be a good choice, she took it in the past but didn't get to a usual therapeutic dose, which probably kept it from working as well as it could. Another option is Luvox. It's in anti-depressant category but is great to help prevent anxiety as well. A good SE is sedation, she'd take it at night. Will start at 25 mg. Please let me know which treatment she wants to try or go ahead and send in the Rx, enough for 30 days, no RF. Does she still have hydroxyzine on hand? If so, take as directed prn for anxiety.  If not, I'll send in Rx for that too. Avoid triggers for anxiety, caffeine, too much screen time.   Thanks.

## 2022-09-24 ENCOUNTER — Other Ambulatory Visit: Payer: Self-pay | Admitting: Physician Assistant

## 2022-10-14 ENCOUNTER — Other Ambulatory Visit: Payer: Self-pay | Admitting: Physician Assistant

## 2022-10-14 NOTE — Telephone Encounter (Signed)
Has appt 7/31

## 2022-10-17 ENCOUNTER — Ambulatory Visit (INDEPENDENT_AMBULATORY_CARE_PROVIDER_SITE_OTHER): Payer: 59 | Admitting: Physician Assistant

## 2022-10-17 ENCOUNTER — Encounter: Payer: Self-pay | Admitting: Physician Assistant

## 2022-10-17 DIAGNOSIS — Z79899 Other long term (current) drug therapy: Secondary | ICD-10-CM

## 2022-10-17 DIAGNOSIS — F319 Bipolar disorder, unspecified: Secondary | ICD-10-CM | POA: Diagnosis not present

## 2022-10-17 DIAGNOSIS — R251 Tremor, unspecified: Secondary | ICD-10-CM

## 2022-10-17 DIAGNOSIS — F99 Mental disorder, not otherwise specified: Secondary | ICD-10-CM

## 2022-10-17 DIAGNOSIS — F411 Generalized anxiety disorder: Secondary | ICD-10-CM | POA: Diagnosis not present

## 2022-10-17 DIAGNOSIS — F5105 Insomnia due to other mental disorder: Secondary | ICD-10-CM | POA: Diagnosis not present

## 2022-10-17 DIAGNOSIS — F17200 Nicotine dependence, unspecified, uncomplicated: Secondary | ICD-10-CM

## 2022-10-17 MED ORDER — BENZTROPINE MESYLATE 0.5 MG PO TABS: 0.5000 mg | ORAL_TABLET | Freq: Two times a day (BID) | ORAL | 1 refills | Status: DC

## 2022-10-17 MED ORDER — FLUVOXAMINE MALEATE 50 MG PO TABS: 50.0000 mg | ORAL_TABLET | Freq: Every day | ORAL | 1 refills | Status: DC

## 2022-10-17 NOTE — Progress Notes (Signed)
[facility]  Patient ID: [name],  MRN: [id number]  PCP: Darrow Bussing, MD  Date of Evaluation: 10/17/2022 Time spent:30 minutes  Chief Complaint:  Chief Complaint   Follow-up    HISTORY/CURRENT STATUS: HPI for routine med check.   Patient here for medication management but also reports tremor.  Mostly in her hands and she has trouble eating because she cannot get fork to her mouth.  She is not having any trouble swallowing.  Reports teeth chattering, that and the hand tremor can occur randomly.  Her legs will shake sometimes too.  The symptoms are not worse at any given time, there is no rhyme or reason to them.   Also very anxious.  We added Luvox over the phone.  She states it has not helping, it has been about a month now.  Still has panic attacks a couple of times a week.  Gabapentin caused confusion.  Xanax or another benzodiazepine has been the only thing that has helped in the past.  Patient is able to enjoy things.  Energy and motivation are at her baseline. No extreme sadness, tearfulness, or feelings of hopelessness.  Sleeps fairly well.  ADLs and personal hygiene are normal.   Denies any changes in concentration, making decisions, or remembering things.  Appetite has not changed.  Weight is stable. Denies suicidal or homicidal thoughts.   Patient denies increased energy with decreased need for sleep, increased talkativeness, racing thoughts, impulsivity or risky behaviors, increased spending, increased libido, grandiosity, increased irritability or anger, paranoia, or hallucinations.  Denies dizziness, syncope, seizures, numbness, tingling, tremor, tics, unsteady gait, slurred speech, confusion. Denies muscle or joint pain, stiffness, or dystonia. Denies unexplained weight loss, frequent infections, or sores that heal slowly.  No polyphagia, polydipsia, or polyuria. Denies visual changes or paresthesias.   Individual Medical History/ Review of Systems:  Changes? :No      Past medications for mental health diagnoses include: Trazodone, Belsomra, Risperdal, Zoloft, hydroxyzine, mirtazapine, Xanax, Valium, Wellbutrin, Abilify, gabapentin, Strattera, Vyvanse, Ambien, Lunesta, Dayvigo didn't help, Latuda, Quviviq, Sonata, Elavil (might have been associated w/ orthostatic hypotension) Aricept she stopped on her own because it was not working  Allergies: Penicillins  Current Medications:  Current Outpatient Medications:    benztropine (COGENTIN) 0.5 MG tablet, Take 1 tablet (0.5 mg total) by mouth 2 (two) times daily., Disp: 60 tablet, Rfl: 1   divalproex (DEPAKOTE) 250 MG DR tablet, TAKE 1 TABLET (250 MG TOTAL) BY MOUTH IN THE MORNING, AT NOON, AND AT BEDTIME., Disp: 90 tablet, Rfl: 0   donepezil (ARICEPT) 5 MG tablet, Take 5 mg by mouth daily., Disp: , Rfl:    enoxaparin (LOVENOX) 60 MG/0.6ML injection, INJECT 0.5 MLS (50 MG TOTAL) INTO THE SKIN 2 (TWO) TIMES DAILY., Disp: 30 mL, Rfl: 3   fluvoxaMINE (LUVOX) 50 MG tablet, Take 1 tablet (50 mg total) by mouth at bedtime., Disp: 30 tablet, Rfl: 1   LORazepam (ATIVAN) 1 MG tablet, TAKE 1 TABLET BY MOUTH AT BEDTIME., Disp: 30 tablet, Rfl: 2   midodrine (PROAMATINE) 2.5 MG tablet, Take 1 tablet (2.5 mg total) by mouth 3 (three) times daily with meals., Disp: 90 tablet, Rfl: 0   omeprazole (PRILOSEC) 40 MG capsule, Take 40 mg by mouth daily as needed (acid reflux)., Disp: , Rfl:    risperiDONE (RISPERDAL) 2 MG tablet, TAKE 1 TABLET BY MOUTH 2 TIMES DAILY., Disp: 180 tablet, Rfl: 0   lactulose (CHRONULAC) 10 GM/15ML solution, Take 15 mLs (10 g total)  by mouth 2 (two) times daily as needed for mild constipation. Titrate for 2-3 BM daily. Hold if loose stool. (Patient not taking: Reported on 06/08/2022), Disp: 236 mL, Rfl: 0   oxycodone (OXY-IR) 5 MG capsule, Take 2 capsules (10 mg total) by mouth every 4 (four) hours as needed. (Patient not taking: Reported on 06/08/2022), Disp: 20 capsule, Rfl: 0 Medication  Side Effects: none  Family Medical/ Social History: Changes? No  MENTAL HEALTH EXAM:  There were no vitals taken for this visit.There is no height or weight on file to calculate BMI.  General Appearance: Casual, Well Groomed, and is very thin which is her normal, looks older than stated age  Eye Contact:  Good  Speech:  Clear and Coherent and Normal Rate  Volume:  Normal  Mood:  Euthymic  Affect:  Congruent  Thought Process:  Goal Directed and Descriptions of Associations: Circumstantial  Orientation:  Full (Time, Place, and Person)  Thought Content: Logical   Suicidal Thoughts:  No  Homicidal Thoughts:  No  Memory:   Fair  Judgement:  Poor  Insight:  Lacking  Psychomotor Activity:  Tremor and bilateral fine motor tremors of outstretched hands  Concentration:  Concentration: Good  Recall:  Fair  Fund of Knowledge: Fair  Language: Fair  Assets:  Interior and spatial designer  ADL's:  Intact  Cognition: WNL  Prognosis:  Poor   PCP follows labs  DIAGNOSES:    ICD-10-CM   1. Bipolar I disorder (HCC)  F31.9     2. Tremor  R25.1     3. Generalized anxiety disorder  F41.1     4. Insomnia due to other mental disorder  F51.05    F99     5. Smoker  F17.200     6. Polypharmacy  Z79.899      Receiving Psychotherapy: No   RECOMMENDATIONS:  PDMP was reviewed.  Ativan filled 10/02/2022.  Prescribed by neurology.  Valium filled 06/08/2022 by neurology.  Gabapentin 04/27/2022 prescribed by me.  Oxycodone filled 09/12/2022. I provided 30 minutes of face to face time during this encounter, including time spent before and after the visit in records review, medical decision making, counseling pertinent to today's visit, and charting.   We discussed the tremor.  I recommend Cogentin.  Benefits, risks and side effects were discussed and she accepts. Will increase Luvox to help with the anxiety.  No benzo due to age, frequent falls, confusion in the past. Smoking  cessation discussed. Told her to try not to get discouraged if the medications are not effective right away, I am purposely starting at very low doses.  Start Cogentin 0.5 mg, 1 p.o. twice daily. Continue Depakote DR 250 mg, 1 po tid. (for seizure) Increase Luvox to 50 mg, 1 p.o. nightly. Continue Ativan 1 mg qhs per neuro.  Continue risperidone 2 mg, 1 p.o. twice daily Recommend multivitamin, B complex, vitamin D, fish oil. Strongly recommend counseling. Return 4 weeks.  Melony Overly, PA-C

## 2022-10-18 ENCOUNTER — Telehealth: Payer: Self-pay | Admitting: *Deleted

## 2022-10-18 NOTE — Progress Notes (Signed)
  Care Coordination  Outreach Note  10/18/2022 Name: [name] MRN: [id number] DOB: October 20, 1955   Care Coordination Outreach Attempts: An unsuccessful telephone outreach was attempted today to offer the patient information about available care coordination services.  Follow Up Plan:  Additional outreach attempts will be made to offer the patient care coordination information and services.   Encounter Outcome:  No Answer  Christie Nottingham  Care Coordination Care Guide  Direct Dial: [phone]

## 2022-10-18 NOTE — Progress Notes (Signed)
  Care Coordination   Note   10/18/2022 Name: [name] MRN: [id number] DOB: 1956-01-22  [name] is a 67 y.o. year old female who sees Koirala, Dibas, MD for primary care. I reached out to [name] by phone today to offer care coordination services.  [name] was given information about Care Coordination services today including:   The Care Coordination services include support from the care team which includes your Nurse Coordinator, Clinical Social Worker, or Pharmacist.  The Care Coordination team is here to help remove barriers to the health concerns and goals most important to you. Care Coordination services are voluntary, and the patient may decline or stop services at any time by request to their care team member.   Care Coordination Consent Status: Patient agreed to services and verbal consent obtained.   Follow up plan:  Telephone appointment with care coordination team member scheduled for:  11/06/22  Encounter Outcome:  Pt. Scheduled  [facility] Coordination Care Guide  Direct Dial: [phone]

## 2022-10-19 ENCOUNTER — Other Ambulatory Visit: Payer: Self-pay | Admitting: Physician Assistant

## 2022-10-24 DIAGNOSIS — G894 Chronic pain syndrome: Secondary | ICD-10-CM | POA: Diagnosis not present

## 2022-10-24 DIAGNOSIS — M5459 Other low back pain: Secondary | ICD-10-CM | POA: Diagnosis not present

## 2022-10-24 DIAGNOSIS — M961 Postlaminectomy syndrome, not elsewhere classified: Secondary | ICD-10-CM | POA: Diagnosis not present

## 2022-10-24 DIAGNOSIS — M5416 Radiculopathy, lumbar region: Secondary | ICD-10-CM | POA: Diagnosis not present

## 2022-10-24 DIAGNOSIS — Z79899 Other long term (current) drug therapy: Secondary | ICD-10-CM | POA: Diagnosis not present

## 2022-10-24 DIAGNOSIS — M47812 Spondylosis without myelopathy or radiculopathy, cervical region: Secondary | ICD-10-CM | POA: Diagnosis not present

## 2022-11-01 ENCOUNTER — Other Ambulatory Visit: Payer: Self-pay | Admitting: Physician Assistant

## 2022-11-06 ENCOUNTER — Ambulatory Visit: Payer: Self-pay

## 2022-11-06 NOTE — Patient Outreach (Signed)
  Care Coordination   Initial Visit Note   11/06/2022 Name: [name] MRN: [id number] DOB: 11/01/1955  [name] is a 67 y.o. year old female who sees Koirala, Dibas, MD for primary care. I spoke with  [name] by phone today.  What matters to the patients health and wellness today?  Maintain Health    Goals Addressed             This Visit's Progress    Maintain Health       Patient Goals/Self Care Activities: -Patient/Caregiver will self-administer medications as prescribed as evidenced by self-report/primary caregiver report  -Patient/Caregiver will attend all scheduled provider appointments as evidenced by clinician review of documented attendance to scheduled appointments and patient/caregiver report -Patient/Caregiver will call provider office for new concerns or questions as evidenced by review of documented incoming telephone call notes and patient report    Patient reports she is doing okay. No recent ED visits.  Patient goal is to maintain her health. She lives with her daughter.  Saw PCP about 2 months ago. No concerns.          SDOH assessments and interventions completed:  Yes  SDOH Interventions Today    Flowsheet Row Most Recent Value  SDOH Interventions   Food Insecurity Interventions Intervention Not Indicated  Housing Interventions Intervention Not Indicated  Transportation Interventions Intervention Not Indicated        Care Coordination Interventions:  Yes, provided   Follow up plan: Follow up call scheduled for October    Encounter Outcome:  Pt. Visit Completed   Bary Leriche, RN, MSN [facility] Care Management Care Management Coordinator Direct Line [phone]

## 2022-11-06 NOTE — Patient Instructions (Signed)
Visit Information  Thank you for taking time to visit with me today. Please don't hesitate to contact me if I can be of assistance to you.   Following are the goals we discussed today:   Goals Addressed             This Visit's Progress    Maintain Health       Patient Goals/Self Care Activities: -Patient/Caregiver will self-administer medications as prescribed as evidenced by self-report/primary caregiver report  -Patient/Caregiver will attend all scheduled provider appointments as evidenced by clinician review of documented attendance to scheduled appointments and patient/caregiver report -Patient/Caregiver will call provider office for new concerns or questions as evidenced by review of documented incoming telephone call notes and patient report    Patient reports she is doing okay. No recent ED visits.  Patient goal is to maintain her health. She lives with her daughter.  Saw PCP about 2 months ago. No concerns.          Our next appointment is by telephone on 01/01/23 at 100 pm  Please call the care guide team at [phone] if you need to cancel or reschedule your appointment.   If you are experiencing a Mental Health or Behavioral Health Crisis or need someone to talk to, please call the Suicide and Crisis Lifeline: 988   Patient verbalizes understanding of instructions and care plan provided today and agrees to view in MyChart. Active MyChart status and patient understanding of how to access instructions and care plan via MyChart confirmed with patient.     The patient has been provided with contact information for the care management team and has been advised to call with any health related questions or concerns.   Bary Leriche, RN, MSN [facility] Care Management Care Management Coordinator Direct Line [phone]

## 2022-11-10 ENCOUNTER — Other Ambulatory Visit: Payer: Self-pay | Admitting: Physician Assistant

## 2022-11-20 ENCOUNTER — Telehealth (INDEPENDENT_AMBULATORY_CARE_PROVIDER_SITE_OTHER): Payer: 59 | Admitting: Physician Assistant

## 2022-11-20 ENCOUNTER — Encounter: Payer: Self-pay | Admitting: Physician Assistant

## 2022-11-20 ENCOUNTER — Encounter: Payer: Self-pay | Admitting: Neurology

## 2022-11-20 DIAGNOSIS — F5105 Insomnia due to other mental disorder: Secondary | ICD-10-CM

## 2022-11-20 DIAGNOSIS — F17200 Nicotine dependence, unspecified, uncomplicated: Secondary | ICD-10-CM

## 2022-11-20 DIAGNOSIS — Z79899 Other long term (current) drug therapy: Secondary | ICD-10-CM

## 2022-11-20 DIAGNOSIS — R251 Tremor, unspecified: Secondary | ICD-10-CM | POA: Diagnosis not present

## 2022-11-20 DIAGNOSIS — F99 Mental disorder, not otherwise specified: Secondary | ICD-10-CM

## 2022-11-20 DIAGNOSIS — F411 Generalized anxiety disorder: Secondary | ICD-10-CM | POA: Diagnosis not present

## 2022-11-20 DIAGNOSIS — F319 Bipolar disorder, unspecified: Secondary | ICD-10-CM

## 2022-11-20 MED ORDER — MIRTAZAPINE 15 MG PO TABS: 7.5000 mg | ORAL_TABLET | Freq: Every day | ORAL | 1 refills | Status: DC

## 2022-11-20 MED ORDER — BENZTROPINE MESYLATE 1 MG PO TABS: 1.0000 mg | ORAL_TABLET | Freq: Two times a day (BID) | ORAL | 1 refills | Status: DC

## 2022-11-20 NOTE — Progress Notes (Signed)
[facility]  Patient ID: [name],  MRN: [id number]  PCP: Darrow Bussing, MD  Date of Evaluation: 11/20/2022 Time spent:25 minutes  Chief Complaint:   Chief Complaint   Depression; Anxiety; Follow-up   Virtual Visit via Telehealth  I connected with patient by a video enabled telemedicine application  with their informed consent, and verified patient privacy and that I am speaking with the correct person using two identifiers.  I am private, in my office and the patient is at home.  I discussed the limitations, risks, security and privacy concerns of performing an evaluation and management service by video and the availability of in person appointments. I also discussed with the patient that there may be a patient responsible charge related to this service. The patient expressed understanding and agreed to proceed.   I discussed the assessment and treatment plan with the patient. The patient was provided an opportunity to ask questions and all were answered. The patient agreed with the plan and demonstrated an understanding of the instructions.   The patient was advised to call back or seek an in-person evaluation if the symptoms worsen or if the condition fails to improve as anticipated.  I provided 25 minutes of non-face-to-face time during this encounter.  HISTORY/CURRENT STATUS: HPI for routine med check.   He started Cogentin at the last visit.  The tremor is slightly better but very minimal.  States she still has a hard time eating, holding a cup, has a hard time driving because she cannot keep the steering wheel steady, and cannot write.  Not sure if the shaking is due to her nerves or what.  She still complains of insomnia.  This has been going on for years.  Now states she sleeps only about 2 hours per night and is exhausted all the time.  Does not nap during the day.  Does not know if she snores or not.  She is on Ativan at night but says it is no longer  working.  She has tried multiple meds (see med list below) and they either did not work or worked for a while and then stopped.  She has not had a sleep study.  She does not enjoy much of anything.  Energy and motivation are fair.  No tearfulness or feelings of hopelessness.  ADLs and personal hygiene are normal.   Denies any changes in concentration, making decisions, or remembering things.  Appetite has not changed.  Weight is stable.   Denies suicidal or homicidal thoughts.  Patient denies increased energy with decreased need for sleep, increased talkativeness, racing thoughts, impulsivity or risky behaviors, increased spending, increased libido, grandiosity, increased irritability or anger, paranoia, or hallucinations.  Denies dizziness, syncope, seizures, numbness, tingling, tremor, tics, unsteady gait, slurred speech, confusion. Denies muscle or joint pain, stiffness, or dystonia. Denies unexplained weight loss, frequent infections, or sores that heal slowly.  No polyphagia, polydipsia, or polyuria. Denies visual changes or paresthesias.   Individual Medical History/ Review of Systems: Changes? :No      Past medications for mental health diagnoses include: Trazodone, Belsomra, Risperdal, Zoloft, hydroxyzine, mirtazapine, Xanax, Valium, Wellbutrin, Abilify, gabapentin, Strattera, Vyvanse, Ambien, Lunesta, Dayvigo didn't help, Latuda, Quviviq, Sonata, Elavil (might have been associated w/ orthostatic hypotension) Aricept she stopped on her own because it was not working  Allergies: Penicillins  Current Medications:  Current Outpatient Medications:    benztropine (COGENTIN) 1 MG tablet, Take 1 tablet (1 mg total) by mouth 2 (two) times daily., Disp:  60 tablet, Rfl: 1   divalproex (DEPAKOTE) 250 MG DR tablet, TAKE 1 TABLET (250 MG TOTAL) BY MOUTH IN THE MORNING, AT NOON, AND AT BEDTIME., Disp: 90 tablet, Rfl: 0   donepezil (ARICEPT) 5 MG tablet, Take 5 mg by mouth daily., Disp: , Rfl:     enoxaparin (LOVENOX) 60 MG/0.6ML injection, INJECT 0.5 MLS (50 MG TOTAL) INTO THE SKIN 2 (TWO) TIMES DAILY., Disp: 30 mL, Rfl: 3   fluvoxaMINE (LUVOX) 50 MG tablet, Take 1 tablet (50 mg total) by mouth at bedtime., Disp: 30 tablet, Rfl: 1   lactulose (CHRONULAC) 10 GM/15ML solution, Take 15 mLs (10 g total) by mouth 2 (two) times daily as needed for mild constipation. Titrate for 2-3 BM daily. Hold if loose stool., Disp: 236 mL, Rfl: 0   LORazepam (ATIVAN) 1 MG tablet, TAKE 1 TABLET BY MOUTH AT BEDTIME., Disp: 30 tablet, Rfl: 2   midodrine (PROAMATINE) 2.5 MG tablet, Take 1 tablet (2.5 mg total) by mouth 3 (three) times daily with meals., Disp: 90 tablet, Rfl: 0   mirtazapine (REMERON) 15 MG tablet, Take 0.5-1 tablets (7.5-15 mg total) by mouth at bedtime., Disp: 30 tablet, Rfl: 1   omeprazole (PRILOSEC) 40 MG capsule, Take 40 mg by mouth daily as needed (acid reflux)., Disp: , Rfl:    risperiDONE (RISPERDAL) 2 MG tablet, TAKE 1 TABLET BY MOUTH 2 TIMES DAILY., Disp: 180 tablet, Rfl: 0   oxycodone (OXY-IR) 5 MG capsule, Take 2 capsules (10 mg total) by mouth every 4 (four) hours as needed. (Patient not taking: Reported on 11/20/2022), Disp: 20 capsule, Rfl: 0 Medication Side Effects: none  Family Medical/ Social History: Changes? No  MENTAL HEALTH EXAM:  There were no vitals taken for this visit.There is no height or weight on file to calculate BMI.  General Appearance: Casual, Well Groomed, and is very thin which is her normal, looks older than stated age  Eye Contact:  Good  Speech:  Clear and Coherent and Normal Rate  Volume:  Normal  Mood:  Euthymic  Affect:  Congruent  Thought Process:  Goal Directed and Descriptions of Associations: Circumstantial  Orientation:  Full (Time, Place, and Person)  Thought Content: Logical   Suicidal Thoughts:  No  Homicidal Thoughts:  No  Memory:   Fair  Judgement:  Poor  Insight:  Lacking  Psychomotor Activity:   Negative tremor visible to outstretched  arms on camera so it is difficult to see.  No abnormal facial movements.  Concentration:  Concentration: Good  Recall:  [employer] of Knowledge: Fair  Language: Fair  Assets:  Interior and spatial designer  ADL's:  Intact  Cognition: WNL  Prognosis:  Poor   PCP follows labs  DIAGNOSES:    ICD-10-CM   1. Bipolar I disorder (HCC)  F31.9     2. Tremor  R25.1     3. Generalized anxiety disorder  F41.1     4. Insomnia due to other mental disorder  F51.05    F99     5. Polypharmacy  Z79.899     6. Smoker  F17.200       Receiving Psychotherapy: No   RECOMMENDATIONS:  PDMP was reviewed.  Ativan filled 10/30/2022.  Prescribed by neurology.  Valium filled 06/08/2022 by neurology.  Gabapentin 04/27/2022 prescribed by me.  Oxycodone filled 09/12/2022. I provided 25 minutes of non-face-to-face time during this encounter, including time spent before and after the visit in records review, medical decision making, counseling pertinent to  today's visit, and charting.   Smoking cessation briefly discussed.  She is not ready to quit. We discussed the tremor.  Exam was not ideal because it is telehealth but I could not see a tremor of her hands.  I recommend increasing the dose of Cogentin.  Again discussed sleep hygiene.  She may need a sleep study, she sees a neurologist and I will send this note to him and see if he thinks a sleep study would be worthwhile.  She does not have the normal body type for obstructive sleep apnea, and although possible, I am wondering if there is another cause for the insomnia that needs to be treated differently.  She sees neurology next month.  Increase Cogentin to 1 mg, 1 p.o. twice daily. Continue Depakote DR 250 mg, 1 po tid. (for seizure) Continue Luvox 50 mg, 1 p.o. nightly. Continue Ativan 1 mg qhs per neuro.  Restart mirtazapine 15 mg, 1/2-1 nightly as needed sleep. Continue risperidone 2 mg, 1 p.o. twice daily Recommend  multivitamin, B complex, vitamin D, fish oil. Strongly recommend counseling. Return 6 weeks.  Melony Overly, PA-C

## 2022-11-21 ENCOUNTER — Other Ambulatory Visit: Payer: Self-pay | Admitting: Neurology

## 2022-11-21 DIAGNOSIS — R1310 Dysphagia, unspecified: Secondary | ICD-10-CM

## 2022-11-22 ENCOUNTER — Encounter: Payer: Self-pay | Admitting: Neurology

## 2022-11-23 ENCOUNTER — Other Ambulatory Visit: Payer: Self-pay | Admitting: Physician Assistant

## 2022-11-24 ENCOUNTER — Other Ambulatory Visit: Payer: Self-pay | Admitting: Neurology

## 2022-11-24 DIAGNOSIS — R0681 Apnea, not elsewhere classified: Secondary | ICD-10-CM

## 2022-11-24 DIAGNOSIS — G4700 Insomnia, unspecified: Secondary | ICD-10-CM

## 2022-11-26 DIAGNOSIS — U071 COVID-19: Secondary | ICD-10-CM | POA: Diagnosis not present

## 2022-12-06 ENCOUNTER — Ambulatory Visit (HOSPITAL_COMMUNITY)
Admission: RE | Admit: 2022-12-06 | Discharge: 2022-12-06 | Disposition: A | Discharge status: Home / Self Care | Payer: 59 | Source: Ambulatory Visit | Attending: Family Medicine | Admitting: Family Medicine

## 2022-12-06 DIAGNOSIS — R638 Other symptoms and signs concerning food and fluid intake: Secondary | ICD-10-CM | POA: Diagnosis not present

## 2022-12-06 DIAGNOSIS — R1312 Dysphagia, oropharyngeal phase: Secondary | ICD-10-CM | POA: Insufficient documentation

## 2022-12-06 DIAGNOSIS — R1310 Dysphagia, unspecified: Secondary | ICD-10-CM

## 2022-12-06 NOTE — Progress Notes (Signed)
Modified Barium Swallow Study  Patient Details  Name: [name] MRN: [id number] Date of Birth: Dec 10, 1955  Today's Date: 12/06/2022  Modified Barium Swallow completed.  Full report located under Chart Review in the Imaging Section.  History of Present Illness     Clinical Impression Pt presents with impaired oropharyngeal swallow. Oral phase is c/b disorganized tongue motion seen during A-P transit of bolus and oral residue post pharyngeal swallow. Pharyngeal phases deficits include absent epiglottic inversion, diminished anterior hypoid movement, diminished pharyngeal stripping wave, incomplete laryngeal vestibule closure. Deficits result in aspiration of thin consistencies with initially no response for pt. Cued cough ineffective for moving aspirated material from airway. Penetration of material above VF with other liquid consistencies, which appears to clear with same or subsequent swallow. Penetration noted to occur during and after swallow d/t pharyngeal residue. Pt appears sensate to residue with demonstration of subsequent volitional swallow which is effective in moving material from pharynx. Pt demonstrates difficulties chewing presented hard solid. Bolus observed to resonant in pharynx, with pt sensate to it's presence. Pt cued to use liquid wash which was effective for moving bolus through PES. Recommend avoiding challenging foods, use of liquid wash when sensation of residue occurs, use of multiple swallows to manage residue. Recommend ongoing thin liquids with soft solids based on today's study. While pt did demonstrate aspiration, increased residue was observed with thicker consistency and pt is ambulatory with no recent hx of PNA per pt report. Recommend through oral care 2-4x per day, remaining active, and overall management of health to reduce risks associated with aspiration. Suggest referral for OP ST to address aforementioned deficits. Suggest consideration for f/u ENT visit with  laryngoscopy to visualize laryngeal structures, f/u with neurologist d/t demonstration of lingual fasciculations and tremor.   Factors that may increase risk of adverse event in presence of aspiration [employer] 2021): Frail or deconditioned;Reduced saliva;Poor general health and/or compromised immunity  Swallow Evaluation Recommendations Recommendations: PO diet PO Diet Recommendation: Dysphagia 3 (Mechanical soft);Thin liquids (Level 0) Liquid Administration via: Cup;Straw Medication Administration: Whole meds with liquid Supervision: Patient able to self-feed Swallowing strategies  : Small bites/sips;Multiple dry swallows after each bite/sip;Follow solids with liquids;Clear throat intermittently Oral care recommendations: Oral care QID (4x/day) Recommended consults: Consider ENT consultation   [name] 12/06/2022,1:06 PM

## 2022-12-10 ENCOUNTER — Other Ambulatory Visit: Payer: Self-pay | Admitting: Neurology

## 2022-12-10 ENCOUNTER — Other Ambulatory Visit: Payer: Self-pay | Admitting: Physician Assistant

## 2022-12-10 DIAGNOSIS — G259 Extrapyramidal and movement disorder, unspecified: Secondary | ICD-10-CM

## 2022-12-10 DIAGNOSIS — R251 Tremor, unspecified: Secondary | ICD-10-CM

## 2022-12-11 ENCOUNTER — Other Ambulatory Visit: Payer: Self-pay | Admitting: Neurology

## 2022-12-11 ENCOUNTER — Other Ambulatory Visit (INDEPENDENT_AMBULATORY_CARE_PROVIDER_SITE_OTHER): Payer: Self-pay

## 2022-12-11 DIAGNOSIS — R251 Tremor, unspecified: Secondary | ICD-10-CM | POA: Diagnosis not present

## 2022-12-11 DIAGNOSIS — G259 Extrapyramidal and movement disorder, unspecified: Secondary | ICD-10-CM

## 2022-12-11 DIAGNOSIS — Z0289 Encounter for other administrative examinations: Secondary | ICD-10-CM

## 2022-12-11 MED ORDER — QUETIAPINE FUMARATE 100 MG PO TABS: 100.0000 mg | ORAL_TABLET | Freq: Every day | ORAL | 5 refills | Status: DC

## 2022-12-12 ENCOUNTER — Telehealth: Payer: Self-pay

## 2022-12-12 ENCOUNTER — Ambulatory Visit (INDEPENDENT_AMBULATORY_CARE_PROVIDER_SITE_OTHER): Payer: 59 | Admitting: Neurology

## 2022-12-12 ENCOUNTER — Encounter: Payer: Self-pay | Admitting: Neurology

## 2022-12-12 VITALS — BP 113/83 | HR 99 | Ht 67.0 in | Wt 114.5 lb

## 2022-12-12 DIAGNOSIS — R413 Other amnesia: Secondary | ICD-10-CM | POA: Diagnosis not present

## 2022-12-12 DIAGNOSIS — R269 Unspecified abnormalities of gait and mobility: Secondary | ICD-10-CM

## 2022-12-12 DIAGNOSIS — G08 Intracranial and intraspinal phlebitis and thrombophlebitis: Secondary | ICD-10-CM

## 2022-12-12 DIAGNOSIS — D6861 Antiphospholipid syndrome: Secondary | ICD-10-CM | POA: Diagnosis not present

## 2022-12-12 DIAGNOSIS — R251 Tremor, unspecified: Secondary | ICD-10-CM

## 2022-12-12 DIAGNOSIS — G4700 Insomnia, unspecified: Secondary | ICD-10-CM | POA: Diagnosis not present

## 2022-12-12 DIAGNOSIS — F418 Other specified anxiety disorders: Secondary | ICD-10-CM

## 2022-12-12 MED ORDER — LORAZEPAM 1 MG PO TABS: ORAL_TABLET | ORAL | 3 refills | Status: DC

## 2022-12-12 NOTE — Progress Notes (Addendum)
[facility]  PATIENT: [name] DOB: 10-06-1955  REFERRING DOCTOR OR PCP: Dr. Darrow Bussing SOURCE: Patient, notes from Dr. Shon Baton, imaging reports and lumbar MRI and CT images personally reviewed.   _________________________________   HISTORICAL  CHIEF COMPLAINT:  Chief Complaint  Patient presents with   Follow-up    Pt in room 10. Here for follow up. Pt said tremors in hand and finger have worsened. Pt states memory is better. Pt reports having problems sleeping, pt reports she can fall asleep. Pt just pickup  Seroquel yesterday has not started.    HISTORY OF PRESENT ILLNESS:  [name] is a 67 y.o. woman with reduced memory and poor gait.  12/12/2022: She is noting more tremor in her hands and lips.   She notes in tongue at times.   She feels more anxious in general and sometimes has panic attacks.   She does not recall if tremor was better with ativan in the past.   She has had more insomnia.   I sent in a prescription yesterday to stop the risperidone and switch to Seroquel.   She had tried one of her daughter's a few nights ago and slept much better.    She has an antiphospolipid antibody.     She is on Lovenox for the dural venous thrombosis.   She was briefly on coumadin late summer 2023 but it was stopped as nontherapeutic.  Genrrally for APL, coumadin is recommended over a DOAC.    She had issues with low and high INRs and coumadin was switched over to lovenox.     She saw Rheum and she was felt not to have SLE.     She has mild cognitive impairment stable.  She feels if anything, hr memory is better now compared to earlier this year.  No recent spell of confusion or hallucination as she had earlier in 2024.  Both memory and confusion are better than last visit.     She has orthostatic hypotension and falls last summer.  She was started on midodrine for the orthostatic hypotension.  This has helped and no longer has spells of LH.  Echocardiogram  was normal.   History of thrombosis/APL syndrome In August 2023, she had mental status changes and seizures.  The CT head showed "Filling defect within the left sigmoid sinus and jugular vein is suspicious for nonocclusive thrombus. Consider confirmation with dedicated CT venogram of the head. "   CT venogram confirmed filling defects in left transverse and sigmoid sinus and internal jugular vein.  She also had blood clots in her legs.  This led to a hospitalization in [place] and she was placed on Lovenox.  For the seizure she was placed on Depakote.  Some medications were changed.  Due to memory issues, Xanax was discontinued.  Antiphospholipid syndrome was assumed.  Initially she was placed on Lovenox and for a while was on a combination Lovenox and Coumadin and then Coumadin.  However, he had difficulty with both supratherapeutic and subtherapeutic levels on Coumadin so was placed back on the Lovenox and continues on that.    I had seen her a couple times in 2021 for fibromyalgia type symptoms, gait disturbance and memory loss.  Labs in 2021 showed low B12 (146 with 180-914 normal).  CK was elevated at 836 (<234 is normal) while hospitalized but was normal when we checked at the last visit.    TSH, ammonia, lactic acid was normal.  02/28/2022    5:01 PM 02/28/2022    4:06 PM 01/04/2020   11:08 AM 07/02/2019    9:26 AM  Montreal Cognitive Assessment   Visuospatial/ Executive (0/5) 2 2 2 2   Naming (0/3) 3 3 3 2   Attention: Read list of digits (0/2) 2 2 2 2   Attention: Read list of letters (0/1) 1 1 1 1   Attention: Serial 7 subtraction starting at 100 (0/3) 2 2 1 2   Language: Repeat phrase (0/2) 1 1 2 2   Language : Fluency (0/1) 0 0 0 0  Abstraction (0/2) 2 2 2 2   Delayed Recall (0/5) 4 4 3 2   Orientation (0/6) 5 5 5 5   Total 22 22 21 20   Adjusted Score (based on education) 23  22 21      Impression: This NCV/EMG study shows the following: 1. Most of the proximal muscles  showed small polyphasic motor units that recruited normally. This could be consistent with a mild myopathy. 2. Although the peroneal motor responses had reduced amplitudes, muscles innervated by that nerve did not show a neuropathic pattern. Other motor and sensory nerves were essentially normal and this is likely to be an incidental finding rather than an indicator of neuropathy 3.   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  Brain parenchyma appeared normal.  There were no acute findings.  CT scan lumbar spine 01/06/2019 showed prior fusion and posterior decompression at the L4-5 level with incorporation within the intervertebral disc spacer, however minimal incorporation across the remaining endplates at this level. Hardware is intact without evidence of loosening.   Probable mild canal stenosis at the L3-4 level.  L4-5 far lateral disc material appears to contact the exited left L4 nerve root. . Central disc protrusion at L5-S1 without evidence of canal stenosis.  MRI of the lumbar spine 09/11/2005 showed mild spinal stenosis at L4-L5 due to disc bulging, facet hypertrophy and ligamenta flava hypertrophy.  There was lateral recess stenosis.  L5-S1 showed facet hypertrophy but no spinal stenosis or foraminal narrowing.  Other levels appeared normal.  Other labs:  The AB42/40 ratio and pTu181 were normal.   NFL slightly increased at 4.99 (<4.6).    REVIEW OF SYSTEMS: Constitutional: No fevers, chills, sweats, or change in appetite Eyes: No visual changes, double vision, eye pain Ear, nose and throat: No hearing loss, ear pain, nasal congestion, sore throat Cardiovascular: No chest pain, palpitations Respiratory:  No shortness of breath at rest or with exertion.   No  wheezes GastrointestinaI: No nausea, vomiting, diarrhea, abdominal pain, fecal incontinence Genitourinary:  No dysuria, urinary retention or frequency.  No nocturia. Musculoskeletal: As above  integumentary: No rash, pruritus, skin lesions Neurological: as above Psychiatric: No depression at this time.  No anxiety Endocrine: No palpitations, diaphoresis, change in appetite, change in weigh or increased thirst Hematologic/Lymphatic:  No anemia, purpura, petechiae. Allergic/Immunologic: No itchy/runny eyes, nasal congestion, recent allergic reactions, rashes  ALLERGIES: Allergies  Allergen Reactions   Penicillins Other (See Comments)    Yeast infection Did it involve swelling of the face/tongue/throat, SOB, or low BP? No Did it involve sudden or severe rash/hives, skin peeling, or any reaction on the inside of your mouth or nose? No Did you need to seek medical attention at a hospital or doctor's office? Yes When  did it last happen? More than 5 years ago  If all above answers are "NO", may proceed with cephalosporin use.     HOME MEDICATIONS:  Current Outpatient Medications:    divalproex (DEPAKOTE) 250 MG DR tablet, TAKE 1 TABLET (250 MG TOTAL) BY MOUTH IN THE MORNING, AT NOON, AND AT BEDTIME., Disp: 90 tablet, Rfl: 0   donepezil (ARICEPT) 5 MG tablet, Take 5 mg by mouth daily., Disp: , Rfl:    enoxaparin (LOVENOX) 60 MG/0.6ML injection, INJECT 0.5 MLS (50 MG TOTAL) INTO THE SKIN 2 (TWO) TIMES DAILY., Disp: 30 mL, Rfl: 3   midodrine (PROAMATINE) 2.5 MG tablet, Take 1 tablet (2.5 mg total) by mouth 3 (three) times daily with meals., Disp: 90 tablet, Rfl: 0   omeprazole (PRILOSEC) 40 MG capsule, Take 40 mg by mouth daily as needed (acid reflux)., Disp: , Rfl:    benztropine (COGENTIN) 1 MG tablet, Take 1 tablet (1 mg total) by mouth 2 (two) times daily. (Patient not taking: Reported on 12/12/2022), Disp: 60 tablet, Rfl: 1   fluvoxaMINE (LUVOX) 50 MG tablet, TAKE 1 TABLET BY MOUTH  EVERYDAY AT BEDTIME (Patient not taking: Reported on 12/12/2022), Disp: 30 tablet, Rfl: 0   lactulose (CHRONULAC) 10 GM/15ML solution, Take 15 mLs (10 g total) by mouth 2 (two) times daily as needed for mild constipation. Titrate for 2-3 BM daily. Hold if loose stool. (Patient not taking: Reported on 12/12/2022), Disp: 236 mL, Rfl: 0   LORazepam (ATIVAN) 1 MG tablet, one po every day prn anxiety or insomnia, Disp: 30 tablet, Rfl: 3   QUEtiapine (SEROQUEL) 100 MG tablet, Take 1 tablet (100 mg total) by mouth at bedtime. (Patient not taking: Reported on 12/12/2022), Disp: 30 tablet, Rfl: 5  PAST MEDICAL HISTORY: Past Medical History:  Diagnosis Date   Anxiety    anxiety and mild depressive order systoms   Bipolar disorder (HCC)     (02/20/2013)   Cervical cancer (HCC) 03/19/1980   Cholelithiasis 11/11/2021   Depression    Dysrhythmia    Headache(784.0)    "monthly" (02/20/2013)   Hyperlipidemia 11/11/2021   Pacemaker    medtronic   PONV (postoperative nausea and vomiting)    Sinus pause 02/14/2013   Syncope and collapse    echo-April 12,2012-EF 55% Echo normal; recorder with prolonged sinus pauses --> status post   Tobacco abuse    Vertigo     PAST SURGICAL HISTORY: Past Surgical History:  Procedure Laterality Date   ABDOMINAL HYSTERECTOMY  03/19/1980   "partial"   BACK SURGERY     CERVICAL FUSION Left 11/2013   C4-C6   CERVICAL FUSION     3,4,5,   INSERT / REPLACE / REMOVE PACEMAKER  02/20/2013   medtronic   LOOP RECORDER IMPLANT N/A 11/06/2012   Procedure: LINQ LOOP RECORDER IMPLANT;  Surgeon: Thurmon Fair, MD;  Location: [facility]ATH [facility];  Service: Cardiovascular;  Laterality: N/A;   NECK SURGERY  11/17/2013   PACEMAKER PLACEMENT N/A 02/16/2013   PERMANENT PACEMAKER INSERTION N/A 02/20/2013   Procedure: PERMANENT PACEMAKER INSERTION;  Surgeon: Thurmon Fair, MD;  Location: [facility] CATH [facility];  Service: Cardiovascular;  Laterality: N/A;   POSTERIOR LUMBAR FUSION  ~ 2009   ROBOTIC ASSISTED  SALPINGO OOPHERECTOMY Bilateral 11/25/2018   Procedure: XI ROBOTIC ASSISTED BILATERAL SALPINGO OOPHORECTOMY and LYSIS OF ADHESIONS.;  Surgeon: Adolphus Birchwood, MD;  Location: [facility];  Service: Gynecology;  Laterality: Bilateral;   SPINAL FUSION  03/19/2014   TONSILLECTOMY     TRANSTHORACIC  ECHOCARDIOGRAM  07/11/2010   The left atrial size is normal.There is no evidence of mitral vavle prolaspe.Right ventriicular systolic pressure is normal . Injection of contrast documented no interatrial shunt. Essentially normal 2D echo -doppler study     FAMILY HISTORY: Family History  Adopted: Yes  Problem Relation Age of Onset   Heart attack Brother     SOCIAL HISTORY:  Social History   Socioeconomic History   Marital status: Widowed    Spouse name: Not on file   Number of children: 3   Years of education: 19   Highest education level: Not on file  Occupational History   Not on file  Tobacco Use   Smoking status: Every Day    Current packs/day: 0.50    Average packs/day: 0.5 packs/day for 40.0 years (20.0 ttl pk-yrs)    Types: Cigarettes   Smokeless tobacco: Never  Vaping Use   Vaping status: Never Used  Substance and Sexual Activity   Alcohol use: No   Drug use: No   Sexual activity: Yes  Other Topics Concern   Not on file  Social History Narrative   Married 29 years . Has 3 children. Current smoker -1 pack a day-for 36 years .Alcohol :  1 beer on occasion.   She is soon to be a grandmother.  His before to going on a trip up to the [place] area to be closer to family when the baby is born.  She is extremely excited.   She very much would like to move back up to that area to be closer to family, but the whether is it just too cold in the winter.   Right handed    Soda daily   Lives alone   Social Determinants of Health   Financial Resource Strain: Low Risk  (10/11/2020)   Overall Financial Resource Strain (CARDIA)    Difficulty of Paying Living Expenses: Not hard at all   Food Insecurity: No Food Insecurity (11/06/2022)   Hunger Vital Sign    Worried About Running Out of Food in the Last Year: Never true    Ran Out of Food in the Last Year: Never true  Transportation Needs: No Transportation Needs (11/06/2022)   PRAPARE - Administrator, Civil Service (Medical): No    Lack of Transportation (Non-Medical): No  Physical Activity: Not on file  Stress: No Stress Concern Present (10/11/2020)   [employer] of Occupational Health - Occupational Stress Questionnaire    Feeling of Stress : Only a little  Social Connections: Not on file  Intimate Partner Violence: Not At Risk (11/22/2021)   Humiliation, Afraid, Rape, and Kick questionnaire    Fear of Current or Ex-Partner: No    Emotionally Abused: No    Physically Abused: No    Sexually Abused: No     PHYSICAL EXAM  Vitals:   12/12/22 1357  BP: 113/83  Pulse: 99  Weight: 114 lb 8 oz (51.9 kg)  Height: 5\' 7"  (1.702 m)    Body mass index is 17.93 kg/m.   General: The patient is well-developed and well-nourished and in no acute distress  HEENT:  Head is [place]/AT.  Sclera are anicteric.  Neck/heart: No carotid artery bruits.  The neck is nontender with good range of motion.  The heart rate is regular.  No murmurs gallops or rubs.  Skin: Extremities are without rash or  edema.   Neurologic Exam  Mental status: Alert and oriented.  Reduced memory  and focus.   Cranial nerves: Extraocular movements are full. There is good facial sensation to soft touch bilaterally.Facial strength is normal. No obvious hearing deficits are noted.  Motor:  She has a low amplitude fast tremor in L>R hand and in lips.   Muscle bulk is normal.   Tone is normal.  No cogwheeling.  Strength is  5 / 5 in all 4 extremities except 4+/5 right EHL.     Sensory: She has mild reduced vibration in the feet relative to the knees.  Sensation was normal in the arms.  Coordination: Cerebellar testing reveals good  finger-nose-finger and heel-to-shin bilaterally.  Gait and station: Station is normal.  Gait is mildly wide.   Turn is mildly off balance. Tandem is wide.  There was no retropulsion.  Romberg is negative.   Reflexes: Deep tendon reflexes are symmetric and normal bilaterally.       DIAGNOSTIC DATA (LABS, IMAGING, TESTING) - I reviewed patient records, labs, notes, testing and imaging myself where available.  Lab Results  Component Value Date   WBC 6.6 09/14/2022   HGB 14.4 09/14/2022   HCT 44.0 09/14/2022   MCV 95.2 09/14/2022   PLT 228 09/14/2022      Component Value Date/Time   NA 139 09/14/2022 1404   NA 139 01/04/2020 1147   K 4.5 09/14/2022 1404   CL 105 09/14/2022 1404   CO2 28 09/14/2022 1404   GLUCOSE 105 (H) 09/14/2022 1404   BUN 12 09/14/2022 1404   BUN 11 01/04/2020 1147   CREATININE 0.92 09/14/2022 1404   CREATININE 1.08 (H) 09/24/2018 1019   CALCIUM 10.0 09/14/2022 1404   PROT 6.6 09/14/2022 1404   PROT 6.6 01/04/2020 1147   ALBUMIN 4.0 09/14/2022 1404   ALBUMIN 3.4 (L) 02/25/2020 0910   AST 9 (L) 09/14/2022 1404   ALT 5 09/14/2022 1404   ALKPHOS 53 09/14/2022 1404   BILITOT 0.6 09/14/2022 1404   GFRNONAA >60 09/14/2022 1404   GFRNONAA 56 (L) 09/24/2018 1019   GFRAA 69 01/04/2020 1147   GFRAA 65 09/24/2018 1019   Lab Results  Component Value Date   CHOL 256 (H) 09/24/2018   HDL 50 09/24/2018   LDLCALC 179 (H) 09/24/2018   TRIG 130 09/24/2018   CHOLHDL 5.1 (H) 09/24/2018       ASSESSMENT AND PLAN  Antiphospholipid syndrome (HCC)  Tremor  Insomnia, unspecified type  Memory loss  Dural venous sinus thrombosis  Gait disturbance  Depression with anxiety  1.  She is currently on Lovenox for dural sinus venous thrombosis.     She has APL syndrome .   2.  continue Aricept for now but if cognition improves further we could consider discontinuing.   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  Labs not c/w Alzheimer and LBD unlikely 3.  Change Risperdal to  Seroquel qHS.   I will add back one Ativan a day 1/2 to 1 for anxiety or sleep.     4.  She will return to see [place] in 3 months or sooner if there are new or worsening neurologic symptoms.    This visit is part of a comprehensive longitudinal care medical relationship regarding the patients primary diagnosis of antiphospholipid syndrome and related concerns.   Zephaniah Lubrano A. Epimenio Foot, MD, [facility] 12/12/2022, 5:26 PM Certified in Neurology, Clinical Neurophysiology, Sleep Medicine and Neuroimaging  [facility] [address] [place], [place] [address] [phone]

## 2022-12-12 NOTE — Telephone Encounter (Signed)
Called pt and let her Per Dr. Epimenio Foot her swallow study results were "She had a swallow study by speech therapy recently.  I do not know if they actually went over the recommendations with her.  She has some dysphagia.   Please remind her that the speech therapist recommendations were:  PO Diet Recommendation: Dysphagia 3 (Mechanical soft); Thin liquids (Level  0)  Supervision: Patient able to self-feed  Swallowing strategies  : Small bites/sips; Multiple dry swallows after  each bite/sip; Follow solids with liquids; Clear throat intermittently". Pt verbalized understanding.

## 2022-12-12 NOTE — Addendum Note (Signed)
Addended by: Asa Lente on: 12/12/2022 05:27 PM   Modules accepted: Level of Service

## 2022-12-13 ENCOUNTER — Other Ambulatory Visit: Payer: Self-pay | Admitting: Physician Assistant

## 2022-12-13 LAB — ANA+ENA+DNA/DS+SCL 70+SJOSSA/B
ANA Titer 1: NEGATIVE
ENA RNP Ab: <0.2 AI (ref 0.0–0.9)
ENA SM Ab Ser-aCnc: <0.2 AI (ref 0.0–0.9)
ENA SSA (RO) Ab: 0.2 AI (ref 0.0–0.9)
ENA SSB (LA) Ab: <0.2 AI (ref 0.0–0.9)
Scleroderma (Scl-70) (ENA) Antibody, IgG: <0.2 AI (ref 0.0–0.9)
dsDNA Ab: 1 IU/mL (ref 0–9)

## 2022-12-13 LAB — COPPER, SERUM: Copper: 145 ug/dL (ref 80–158)

## 2022-12-13 LAB — CERULOPLASMIN: Ceruloplasmin: 32.9 mg/dL (ref 19.0–39.0)

## 2022-12-17 ENCOUNTER — Telehealth: Payer: Self-pay | Admitting: Neurology

## 2022-12-17 NOTE — Telephone Encounter (Signed)
[employer]- [employer] medicare no auth req  [employer].

## 2022-12-19 ENCOUNTER — Ambulatory Visit (INDEPENDENT_AMBULATORY_CARE_PROVIDER_SITE_OTHER): Payer: 59

## 2022-12-19 DIAGNOSIS — I495 Sick sinus syndrome: Secondary | ICD-10-CM

## 2022-12-19 LAB — CUP PACEART REMOTE DEVICE CHECK
Battery Impedance: 859 Ohm
Battery Remaining Longevity: 82 mo
Battery Voltage: 2.77 V
Brady Statistic AP VP Percent: 0 %
Brady Statistic AP VS Percent: 0 %
Brady Statistic AS VP Percent: 3 %
Brady Statistic AS VS Percent: 97 %
Date Time Interrogation Session: 20241002105307
Implantable Lead Connection Status: 753985
Implantable Lead Connection Status: 753985
Implantable Lead Implant Date: 20141205
Implantable Lead Implant Date: 20141205
Implantable Lead Location: 753859
Implantable Lead Location: 753860
Implantable Lead Model: 5076
Implantable Lead Model: 5076
Implantable Pulse Generator Implant Date: 20141205
Lead Channel Impedance Value: 465 Ohm
Lead Channel Impedance Value: 492 Ohm
Lead Channel Pacing Threshold Amplitude: 0.375 V
Lead Channel Pacing Threshold Amplitude: 1.125 V
Lead Channel Pacing Threshold Pulse Width: 0.4 ms
Lead Channel Pacing Threshold Pulse Width: 0.4 ms
Lead Channel Setting Pacing Amplitude: 2 V
Lead Channel Setting Pacing Amplitude: 3 V
Lead Channel Setting Pacing Pulse Width: 0.4 ms
Lead Channel Setting Sensing Sensitivity: 2 mV
Zone Setting Status: 755011
Zone Setting Status: 755011

## 2022-12-31 DIAGNOSIS — Z0000 Encounter for general adult medical examination without abnormal findings: Secondary | ICD-10-CM | POA: Diagnosis not present

## 2022-12-31 DIAGNOSIS — F17210 Nicotine dependence, cigarettes, uncomplicated: Secondary | ICD-10-CM | POA: Diagnosis not present

## 2022-12-31 DIAGNOSIS — Z23 Encounter for immunization: Secondary | ICD-10-CM | POA: Diagnosis not present

## 2022-12-31 DIAGNOSIS — G3184 Mild cognitive impairment, so stated: Secondary | ICD-10-CM | POA: Diagnosis not present

## 2022-12-31 DIAGNOSIS — R569 Unspecified convulsions: Secondary | ICD-10-CM | POA: Diagnosis not present

## 2022-12-31 DIAGNOSIS — D6859 Other primary thrombophilia: Secondary | ICD-10-CM | POA: Diagnosis not present

## 2022-12-31 DIAGNOSIS — E43 Unspecified severe protein-calorie malnutrition: Secondary | ICD-10-CM | POA: Diagnosis not present

## 2022-12-31 DIAGNOSIS — Z79899 Other long term (current) drug therapy: Secondary | ICD-10-CM | POA: Diagnosis not present

## 2022-12-31 DIAGNOSIS — D6861 Antiphospholipid syndrome: Secondary | ICD-10-CM | POA: Diagnosis not present

## 2023-01-01 ENCOUNTER — Other Ambulatory Visit: Payer: Self-pay | Admitting: Family Medicine

## 2023-01-01 ENCOUNTER — Ambulatory Visit: Payer: Self-pay

## 2023-01-01 DIAGNOSIS — E2839 Other primary ovarian failure: Secondary | ICD-10-CM

## 2023-01-01 NOTE — Patient Outreach (Signed)
Care Coordination   01/01/2023 Name: [name] MRN: [id number] DOB: 06/17/1955   Care Coordination Outreach Attempts:  An unsuccessful telephone outreach was attempted for a scheduled appointment today.  Follow Up Plan:  Additional outreach attempts will be made to offer the patient care coordination information and services.   Encounter Outcome:  No Answer   Care Coordination Interventions:  No, not indicated     Bary Leriche, RN, MSN [facility]  [facility], [facility] Management Community Coordinator Direct Dial: [phone]  Fax: [phone] Website: [email or web address]

## 2023-01-02 ENCOUNTER — Encounter: Payer: Self-pay | Admitting: Emergency Medicine

## 2023-01-02 ENCOUNTER — Ambulatory Visit: Payer: 59 | Admitting: Physician Assistant

## 2023-01-03 ENCOUNTER — Other Ambulatory Visit: Payer: Self-pay | Admitting: Neurology

## 2023-01-03 ENCOUNTER — Other Ambulatory Visit: Payer: Self-pay | Admitting: Physician Assistant

## 2023-01-03 NOTE — Telephone Encounter (Signed)
Last seen on 12/12/22 Follow up scheduled on 06/20/23   Pharmacy requesting 90 day supply

## 2023-01-04 ENCOUNTER — Emergency Department (HOSPITAL_COMMUNITY)
Admission: EM | Admit: 2023-01-04 | Discharge: 2023-01-05 | Disposition: A | Discharge status: Home / Self Care | Payer: 59 | Attending: Emergency Medicine | Admitting: Emergency Medicine

## 2023-01-04 ENCOUNTER — Encounter (HOSPITAL_COMMUNITY): Payer: Self-pay

## 2023-01-04 ENCOUNTER — Other Ambulatory Visit: Payer: Self-pay

## 2023-01-04 ENCOUNTER — Emergency Department (HOSPITAL_COMMUNITY): Payer: 59

## 2023-01-04 DIAGNOSIS — Z1152 Encounter for screening for COVID-19: Secondary | ICD-10-CM | POA: Insufficient documentation

## 2023-01-04 DIAGNOSIS — R6889 Other general symptoms and signs: Secondary | ICD-10-CM | POA: Diagnosis not present

## 2023-01-04 DIAGNOSIS — Z743 Need for continuous supervision: Secondary | ICD-10-CM | POA: Diagnosis not present

## 2023-01-04 DIAGNOSIS — R0689 Other abnormalities of breathing: Secondary | ICD-10-CM | POA: Diagnosis not present

## 2023-01-04 DIAGNOSIS — J069 Acute upper respiratory infection, unspecified: Secondary | ICD-10-CM | POA: Insufficient documentation

## 2023-01-04 DIAGNOSIS — Z950 Presence of cardiac pacemaker: Secondary | ICD-10-CM | POA: Insufficient documentation

## 2023-01-04 DIAGNOSIS — R059 Cough, unspecified: Secondary | ICD-10-CM | POA: Diagnosis present

## 2023-01-04 DIAGNOSIS — R0602 Shortness of breath: Secondary | ICD-10-CM | POA: Diagnosis not present

## 2023-01-04 DIAGNOSIS — I499 Cardiac arrhythmia, unspecified: Secondary | ICD-10-CM | POA: Diagnosis not present

## 2023-01-04 DIAGNOSIS — Z8541 Personal history of malignant neoplasm of cervix uteri: Secondary | ICD-10-CM | POA: Diagnosis not present

## 2023-01-04 DIAGNOSIS — R531 Weakness: Secondary | ICD-10-CM | POA: Diagnosis not present

## 2023-01-04 LAB — COMPREHENSIVE METABOLIC PANEL
ALT: 12 U/L (ref 0–44)
AST: 20 U/L (ref 15–41)
Albumin: 4 g/dL (ref 3.5–5.0)
Alkaline Phosphatase: 58 U/L (ref 38–126)
Anion gap: 14 (ref 5–15)
BUN: 9 mg/dL (ref 8–23)
CO2: 15 mmol/L — ABNORMAL LOW (ref 22–32)
Calcium: 9.6 mg/dL (ref 8.9–10.3)
Chloride: 111 mmol/L (ref 98–111)
Creatinine, Ser: 0.94 mg/dL (ref 0.44–1.00)
GFR, Estimated: >60 mL/min (ref >60)
Glucose, Bld: 105 mg/dL — ABNORMAL HIGH (ref 70–99)
Potassium: 3.7 mmol/L (ref 3.5–5.1)
Sodium: 140 mmol/L (ref 135–145)
Total Bilirubin: 1.3 mg/dL — ABNORMAL HIGH (ref 0.3–1.2)
Total Protein: 6.9 g/dL (ref 6.5–8.1)

## 2023-01-04 LAB — RESP PANEL BY RT-PCR (RSV, FLU A&B, COVID)  RVPGX2
Influenza A by PCR: NEGATIVE
Influenza B by PCR: NEGATIVE
Resp Syncytial Virus by PCR: NEGATIVE
SARS Coronavirus 2 by RT PCR: NEGATIVE

## 2023-01-04 NOTE — Progress Notes (Signed)
Remote pacemaker transmission.   

## 2023-01-04 NOTE — ED Provider Triage Note (Signed)
Emergency Medicine Provider Triage Evaluation Note  [name] , a 67 y.o. female  was evaluated in triage.  Pt complains of congestion, rhinorrhea, shortness of breath, fatigue, chills, generalized weakness.  Symptoms began 4 days ago.  No known exposure to flu or COVID.  No cough.  Review of Systems  Positive: As above Negative: As above  Physical Exam  Ht 5\' 7"  (1.702 m)   Wt 51.9 kg   SpO2 97%   BMI 17.92 kg/m  Gen:   Awake, no distress   Resp:  Normal effort, no rales or rhonchi or wheezing MSK:   Moves extremities without difficulty  Other:    Medical Decision Making  Medically screening exam initiated at 5:44 PM.  Appropriate orders placed.  SERITA SILVER was informed that the remainder of the evaluation will be completed by another provider, this initial triage assessment does not replace that evaluation, and the importance of remaining in the ED until their evaluation is complete.  Found to be febrile and significantly tachycardic.  Will initiate sepsis labs   Michelle Piper, Cordelia Poche 01/04/23 1747

## 2023-01-04 NOTE — ED Triage Notes (Signed)
Pt arrived via [facility] from home for c/o nasal congestion, chills, generalized weaknessx4d.

## 2023-01-05 LAB — CBC WITH DIFFERENTIAL/PLATELET
Abs Immature Granulocytes: 0.03 10*3/uL (ref 0.00–0.07)
Basophils Absolute: 0.1 10*3/uL (ref 0.0–0.1)
Basophils Relative: 1 %
Eosinophils Absolute: 0.4 10*3/uL (ref 0.0–0.5)
Eosinophils Relative: 4 %
HCT: 40.3 % (ref 36.0–46.0)
Hemoglobin: 13.2 g/dL (ref 12.0–15.0)
Immature Granulocytes: 0 %
Lymphocytes Relative: 33 %
Lymphs Abs: 3 10*3/uL (ref 0.7–4.0)
MCH: 29.3 pg (ref 26.0–34.0)
MCHC: 32.8 g/dL (ref 30.0–36.0)
MCV: 89.4 fL (ref 80.0–100.0)
Monocytes Absolute: 0.6 10*3/uL (ref 0.1–1.0)
Monocytes Relative: 7 %
Neutro Abs: 5 10*3/uL (ref 1.7–7.7)
Neutrophils Relative %: 55 %
Platelets: 369 10*3/uL (ref 150–400)
RBC: 4.51 MIL/uL (ref 3.87–5.11)
RDW: 13.9 % (ref 11.5–15.5)
WBC: 9 10*3/uL (ref 4.0–10.5)
nRBC: 0 % (ref 0.0–0.2)

## 2023-01-05 LAB — I-STAT CG4 LACTIC ACID, ED: Lactic Acid, Venous: 1.7 mmol/L (ref 0.5–1.9)

## 2023-01-05 MED ORDER — FLUTICASONE PROPIONATE 50 MCG/ACT NA SUSP
2.0000 | Freq: Every day | NASAL | 0 refills | Status: DC
Start: 2023-01-05 — End: 2023-07-24

## 2023-01-05 MED ORDER — ACETAMINOPHEN 500 MG PO TABS
1000.0000 mg | ORAL_TABLET | Freq: Once | ORAL | Status: AC
  Administered 2023-01-05: 1000 mg via ORAL
  Filled 2023-01-05: qty 2

## 2023-01-05 MED ORDER — BENZONATATE 100 MG PO CAPS: 100.0000 mg | ORAL_CAPSULE | Freq: Three times a day (TID) | ORAL | 0 refills | Status: DC | PRN

## 2023-01-05 NOTE — ED Provider Notes (Signed)
Emergency Department Provider Note   I have reviewed the triage vital signs and the nursing notes.   HISTORY  Chief Complaint URI   HPI [name] is a 67 y.o. female with past history reviewed below presents to the emergency department for evaluation of cough/congestion along with fever/chills and fatigue.  Symptoms have been worsening over the past 4 days.  She is not experiencing any chest pain or shortness of breath.  No abdominal pain, vomiting, diarrhea.  She denies any dysuria, hesitancy, urgency.  No sick contacts.  She has an extensive past medical history and has been hesitant to take any antipyretics due to her home medications including Lovenox shots.  He tells me that intermittently she has had shaking chills that she is unable to stop which prompted her ED evaluation.    Past Medical History:  Diagnosis Date   Anxiety    anxiety and mild depressive order systoms   Bipolar disorder (HCC)     (02/20/2013)   Cervical cancer (HCC) 03/19/1980   Cholelithiasis 11/11/2021   Depression    Dysrhythmia    Headache(784.0)    "monthly" (02/20/2013)   Hyperlipidemia 11/11/2021   Pacemaker    medtronic   PONV (postoperative nausea and vomiting)    Sinus pause 02/14/2013   Syncope and collapse    echo-April 12,2012-EF 55% Echo normal; recorder with prolonged sinus pauses --> status post   Tobacco abuse    Vertigo     Review of Systems  Constitutional: Positive fever/chills and weakness.  ENT: No sore throat. Positive congestion.  Cardiovascular: Denies chest pain. Respiratory: Denies shortness of breath. Positive cough.  Gastrointestinal: No abdominal pain.  No nausea, no vomiting.   Genitourinary: Negative for dysuria. Musculoskeletal: Negative for back pain. Skin: Negative for rash. Neurological: Negative for headaches.  ____________________________________________   PHYSICAL EXAM:  VITAL SIGNS: ED Triage Vitals  Encounter Vitals Group     BP 01/04/23  1935 (!) 128/99     Pulse Rate 01/04/23 1935 87     Resp 01/04/23 1935 (!) 26     Temp 01/04/23 1935 98.3 F (36.8 C)     Temp src --      SpO2 01/04/23 1733 97 %     Weight 01/04/23 1734 114 lb 6.7 oz (51.9 kg)     Height 01/04/23 1734 5\' 7"  (1.702 m)   Constitutional: Alert and oriented. Patient with active rigors on my assessment.  Eyes: Conjunctivae are normal.  Head: Atraumatic. Nose: No congestion/rhinnorhea. Mouth/Throat: Mucous membranes are slightly dry.  Neck: No stridor.   Cardiovascular: Normal rate, regular rhythm. Good peripheral circulation. Grossly normal heart sounds.   Respiratory: Normal respiratory effort.  No retractions. Lungs CTAB. Gastrointestinal: Soft and nontender. No distention.  Musculoskeletal: No lower extremity tenderness nor edema. No gross deformities of extremities. Neurologic:  Normal speech and language. No gross focal neurologic deficits are appreciated.  Skin:  Skin is warm, dry and intact. No rash noted.  ____________________________________________   LABS (all labs ordered are listed, but only abnormal results are displayed)  Labs Reviewed  COMPREHENSIVE METABOLIC PANEL - Abnormal; Notable for the following components:      Result Value   CO2 15 (*)    Glucose, Bld 105 (*)    Total Bilirubin 1.3 (*)    All other components within normal limits  RESP PANEL BY RT-PCR (RSV, FLU A&B, COVID)  RVPGX2  CULTURE, BLOOD (ROUTINE X 2)  CULTURE, BLOOD (ROUTINE X 2)  CBC  WITH DIFFERENTIAL/PLATELET  URINALYSIS, ROUTINE W REFLEX MICROSCOPIC  I-STAT CG4 LACTIC ACID, ED  I-STAT CG4 LACTIC ACID, ED  I-STAT CG4 LACTIC ACID, ED   ____________________________________________  RADIOLOGY  DG Chest 2 View  Result Date: 01/04/2023 CLINICAL DATA:  Shortness of breath EXAM: CHEST - 2 VIEW COMPARISON:  02/04/2022 FINDINGS: Hardware in the cervical spine. Left-sided pacing device with leads over right atrium and right ventricle. No acute airspace  disease, pleural effusion or pneumothorax. Normal cardiac size. IMPRESSION: No active cardiopulmonary disease. Electronically Signed   By: Jasmine Pang M.D.   On: 01/04/2023 20:28    ____________________________________________   PROCEDURES  Procedure(s) performed:   Procedures  None  ____________________________________________   INITIAL IMPRESSION / ASSESSMENT AND PLAN / ED COURSE  Pertinent labs & imaging results that were available during my care of the patient were reviewed by me and considered in my medical decision making (see chart for details).   This patient is Presenting for Evaluation of fever/chills, which does require a range of treatment options, and is a complaint that involves a moderate risk of morbidity and mortality.  The Differential Diagnoses include URI, COVID, Flu, CAP, SIRS/sepsis, bacteremia, etc.  Critical Interventions-    Medications  acetaminophen (TYLENOL) tablet 1,000 mg (1,000 mg Oral Given 01/05/23 0214)    Reassessment after intervention: rigors resolved.   Clinical Laboratory Tests Ordered, included lactic acid normal.  No leukocytosis.  COVID/flu/RSV negative.  No acute kidney injury  Radiologic Tests Ordered, included CXR. I independently interpreted the images and agree with radiology interpretation.   Cardiac Monitor Tracing which shows NSR.   Medical Decision Making: Summary:  Patient presents emergency department with congestion and symptoms that seem most consistent with upper respiratory infection, however, on my assessment she appears to have active rigors.  Question bacteremia versus more serious underlying infection.  No obvious infiltrate on chest x-ray.  Plan for cultures and additional blood work including lactic acid.  Reevaluation with update and discussion with patient.  Labs are largely unremarkable.  Lactic acid normal.  She is looking much better on reassessment.  No SIRS vitals.  Considered admission but ED workup is  reassuring. Plan for symptom mgmt and PCP follow up.   Patient's presentation is most consistent with acute, uncomplicated illness.   Disposition: discharge  ____________________________________________  FINAL CLINICAL IMPRESSION(S) / ED DIAGNOSES  Final diagnoses:  Upper respiratory tract infection, unspecified type  Flu-like symptoms  Rigors     NEW OUTPATIENT MEDICATIONS STARTED DURING THIS VISIT:  Discharge Medication List as of 01/05/2023  4:04 AM     START taking these medications   Details  benzonatate (TESSALON) 100 MG capsule Take 1 capsule (100 mg total) by mouth 3 (three) times daily as needed for cough., Starting Sat 01/05/2023, Normal    fluticasone (FLONASE) 50 MCG/ACT nasal spray Place 2 sprays into both nostrils daily for 7 days., Starting Sat 01/05/2023, Until Sat 01/12/2023, Normal        Note:  This document was prepared using Dragon voice recognition software and may include unintentional dictation errors.  Alona Bene, MD, [facility]    Rena Sweeden, Arlyss Repress, MD 01/05/23 [phone]

## 2023-01-05 NOTE — Discharge Instructions (Addendum)
You were seen in the ED today with fever/chills and cough. Your COVID test was negative and your chest x-ray did not show pneumonia. You may take Tylenol as needed for chills/fever at home by following the instructions on the box. I have sent two medications to the pharmacy to help with cough and congestion. Please follow closely with your primary care doctor and return to the ED with any new or suddenly worsening symptoms.

## 2023-01-08 DIAGNOSIS — J069 Acute upper respiratory infection, unspecified: Secondary | ICD-10-CM | POA: Diagnosis not present

## 2023-01-09 ENCOUNTER — Telehealth: Payer: Self-pay

## 2023-01-09 NOTE — Patient Outreach (Signed)
Care Coordination   01/09/2023 Name: COLIN PUCK MRN: [id number] DOB: 27-Aug-1955   Care Coordination Outreach Attempts:  A second unsuccessful outreach was attempted today to offer the patient with information about available care coordination services.  Follow Up Plan:  Additional outreach attempts will be made to offer the patient care coordination information and services.   Encounter Outcome:  No Answer   Care Coordination Interventions:  No, not indicated    Bary Leriche, RN, MSN [facility]  [facility], [facility] Management Community Coordinator Direct Dial: [phone]  Fax: [phone] Website: [email or web address]

## 2023-01-10 LAB — CULTURE, BLOOD (ROUTINE X 2)
Culture: NO GROWTH
Culture: NO GROWTH
Special Requests: ADEQUATE

## 2023-01-11 ENCOUNTER — Telehealth: Payer: Self-pay

## 2023-01-11 NOTE — Patient Outreach (Signed)
Care Coordination   Follow Up Visit Note   01/11/2023 Name: [name] MRN: [id number] DOB: Nov 23, 1955  [name] is a 67 y.o. year old female who sees Koirala, Dibas, MD for primary care. I spoke with  [name] by phone today.  What matters to the patients health and wellness today?  Maintain health    Goals Addressed             This Visit's Progress    Maintain Health       Patient Goals/Self Care Activities: -Patient/Caregiver will self-administer medications as prescribed as evidenced by self-report/primary caregiver report  -Patient/Caregiver will attend all scheduled provider appointments as evidenced by clinician review of documented attendance to scheduled appointments and patient/caregiver report -Patient/Caregiver will call provider office for new concerns or questions as evidenced by review of documented incoming telephone call notes and patient report    Patient reports she is doing okay. Recent URI.  But she reports that she is feeling better now.  Discussed taking precaution to colds and flu.  She verbalized understanding.  No concerns.          SDOH assessments and interventions completed:  Yes  SDOH Interventions Today    Flowsheet Row Most Recent Value  SDOH Interventions   Health Literacy Interventions Intervention Not Indicated        Care Coordination Interventions:  Yes, provided   Follow up plan: Follow up call scheduled for December    Encounter Outcome:  Patient Visit Completed   Bary Leriche, RN, MSN [place]  [facility], [facility] Management Community Coordinator Direct Dial: [phone]  Fax: [phone] Website: [email or web address]

## 2023-01-11 NOTE — Patient Instructions (Signed)
Visit Information  Thank you for taking time to visit with me today. Please don't hesitate to contact me if I can be of assistance to you.   Following are the goals we discussed today:   Goals Addressed             This Visit's Progress    Maintain Health       Patient Goals/Self Care Activities: -Patient/Caregiver will self-administer medications as prescribed as evidenced by self-report/primary caregiver report  -Patient/Caregiver will attend all scheduled provider appointments as evidenced by clinician review of documented attendance to scheduled appointments and patient/caregiver report -Patient/Caregiver will call provider office for new concerns or questions as evidenced by review of documented incoming telephone call notes and patient report    Patient reports she is doing okay. Recent URI.  But she reports that she is feeling better now.  Discussed taking precaution to colds and flu.  She verbalized understanding.  No concerns.          Our next appointment is by telephone on 03/05/23 at 1200 pm  Please call the care guide team at [phone] if you need to cancel or reschedule your appointment.   If you are experiencing a Mental Health or Behavioral Health Crisis or need someone to talk to, please call the Suicide and Crisis Lifeline: 988   Patient verbalizes understanding of instructions and care plan provided today and agrees to view in MyChart. Active MyChart status and patient understanding of how to access instructions and care plan via MyChart confirmed with patient.     The patient has been provided with contact information for the care management team and has been advised to call with any health related questions or concerns.   Bary Leriche, RN, MSN [facility], [facility] Management Community Coordinator Direct Dial: [phone]  Fax: [phone] Website: [email or web address]

## 2023-01-14 ENCOUNTER — Other Ambulatory Visit: Payer: Self-pay | Admitting: Physician Assistant

## 2023-01-18 ENCOUNTER — Other Ambulatory Visit: Payer: Self-pay | Admitting: Physician Assistant

## 2023-01-18 NOTE — Telephone Encounter (Signed)
LVM to RC to verify if she is taking Mirtazapine 15 mg

## 2023-01-22 NOTE — Telephone Encounter (Signed)
LVM to r/c.

## 2023-01-24 NOTE — Telephone Encounter (Signed)
Sent MyChart message to ask if she is still taking.

## 2023-01-25 NOTE — Telephone Encounter (Signed)
Patient reported not taking.

## 2023-01-27 ENCOUNTER — Other Ambulatory Visit: Payer: Self-pay | Admitting: Physician Assistant

## 2023-02-21 ENCOUNTER — Other Ambulatory Visit: Payer: Self-pay | Admitting: Physician Assistant

## 2023-02-24 NOTE — Telephone Encounter (Signed)
Please call to schedule an appt, was due in Oct, canceled appt.

## 2023-02-25 NOTE — Telephone Encounter (Signed)
LVM. If she still taking?

## 2023-02-26 NOTE — Telephone Encounter (Signed)
Patient reports no longer taking.

## 2023-02-28 ENCOUNTER — Other Ambulatory Visit: Payer: Self-pay | Admitting: Hematology and Oncology

## 2023-02-28 ENCOUNTER — Inpatient Hospital Stay (HOSPITAL_BASED_OUTPATIENT_CLINIC_OR_DEPARTMENT_OTHER): Payer: 59 | Admitting: Hematology and Oncology

## 2023-02-28 ENCOUNTER — Inpatient Hospital Stay: Payer: 59 | Attending: Hematology and Oncology

## 2023-02-28 VITALS — BP 134/85 | HR 68 | Temp 97.9°F | Resp 13 | Wt 116.3 lb

## 2023-02-28 DIAGNOSIS — Z86718 Personal history of other venous thrombosis and embolism: Secondary | ICD-10-CM

## 2023-02-28 DIAGNOSIS — D6861 Antiphospholipid syndrome: Secondary | ICD-10-CM | POA: Diagnosis not present

## 2023-02-28 DIAGNOSIS — F17210 Nicotine dependence, cigarettes, uncomplicated: Secondary | ICD-10-CM | POA: Insufficient documentation

## 2023-02-28 DIAGNOSIS — Z86711 Personal history of pulmonary embolism: Secondary | ICD-10-CM | POA: Insufficient documentation

## 2023-02-28 DIAGNOSIS — Z7901 Long term (current) use of anticoagulants: Secondary | ICD-10-CM | POA: Diagnosis not present

## 2023-02-28 DIAGNOSIS — Z8541 Personal history of malignant neoplasm of cervix uteri: Secondary | ICD-10-CM | POA: Diagnosis not present

## 2023-02-28 LAB — CMP (CANCER CENTER ONLY)
ALT: <5 U/L (ref 0–44)
AST: 9 U/L — ABNORMAL LOW (ref 15–41)
Albumin: 4.4 g/dL (ref 3.5–5.0)
Alkaline Phosphatase: 63 U/L (ref 38–126)
Anion gap: 7 (ref 5–15)
BUN: 9 mg/dL (ref 8–23)
CO2: 28 mmol/L (ref 22–32)
Calcium: 9.5 mg/dL (ref 8.9–10.3)
Chloride: 109 mmol/L (ref 98–111)
Creatinine: 0.76 mg/dL (ref 0.44–1.00)
GFR, Estimated: >60 mL/min (ref >60)
Glucose, Bld: 99 mg/dL (ref 70–99)
Potassium: 4 mmol/L (ref 3.5–5.1)
Sodium: 144 mmol/L (ref 135–145)
Total Bilirubin: 0.6 mg/dL (ref <1.2)
Total Protein: 6.9 g/dL (ref 6.5–8.1)

## 2023-02-28 LAB — CBC WITH DIFFERENTIAL (CANCER CENTER ONLY)
Abs Immature Granulocytes: 0.01 10*3/uL (ref 0.00–0.07)
Basophils Absolute: 0.1 10*3/uL (ref 0.0–0.1)
Basophils Relative: 2 %
Eosinophils Absolute: 0.2 10*3/uL (ref 0.0–0.5)
Eosinophils Relative: 3 %
HCT: 45.2 % (ref 36.0–46.0)
Hemoglobin: 15 g/dL (ref 12.0–15.0)
Immature Granulocytes: 0 %
Lymphocytes Relative: 39 %
Lymphs Abs: 2.9 10*3/uL (ref 0.7–4.0)
MCH: 30.3 pg (ref 26.0–34.0)
MCHC: 33.2 g/dL (ref 30.0–36.0)
MCV: 91.3 fL (ref 80.0–100.0)
Monocytes Absolute: 0.5 10*3/uL (ref 0.1–1.0)
Monocytes Relative: 6 %
Neutro Abs: 3.8 10*3/uL (ref 1.7–7.7)
Neutrophils Relative %: 50 %
Platelet Count: 257 10*3/uL (ref 150–400)
RBC: 4.95 MIL/uL (ref 3.87–5.11)
RDW: 13.6 % (ref 11.5–15.5)
WBC Count: 7.4 10*3/uL (ref 4.0–10.5)
nRBC: 0 % (ref 0.0–0.2)

## 2023-02-28 NOTE — Progress Notes (Signed)
[facility] [phone]   [phone]  PROGRESS NOTE  Patient Care Team: Darrow Bussing, MD as PCP - General (Family Medicine) Gwynneth Macleod as Physician Assistant (Psychiatry) Fleeta Emmer, RN as [facility] Care Management  Hematological/Oncological History 1) 10/23/2021-11/11/2021: Admitted In [place] staying 45 seconds and witnessed by her daughter.  CT imaging revealed an acute right lobar, segmental and subsegmental pulmonary emboli.  Lower extremity Doppler was positive for DVT.  She was started on IV heparin.  Hypercoagulable testing was performed.  Findings revealed beta-2 glycoprotein IgM elevated greater than 150 and anticardiolipin antibodies IgM elevated above 103.5.Patient was discharged on Lovenox bridging to Coumadin due to concern for possible antiphospholipid syndrome.  2. 11/18/2021-11/26/2021: Readmitted at [facility] due to altered mental status and was found to have a UTI and therapeutic INR. Her INR was found to be 3.6 and subsequently 4.2 on 11/22/2021.  Patient was switched to Lovenox 1 mg/kg twice daily due to difficulty controlling her INRs.  3. 12/05/2021: Established care with [facility]  CHIEF COMPLAINTS/PURPOSE OF CONSULTATION:  "DVT and PE concerning for antiphospholipid antibody syndrome"  HISTORY OF PRESENTING ILLNESS:  [name] 67 y.o. female returns for a follow up visit.  She is unaccompanied for this visit.  [name] reports she has been well overall in the room since her last visit.  She did have an emergency room visit when she took 2 vaccinations at the same time and had a bad reaction.  She took the flu and the pneumonia vaccines.  She reports recovered quickly.  She notes that otherwise her energy and appetite have been good.  She continues taking her Lovenox 2 shots daily without any difficulty.  She notes that she is rotating which side and areas of the abdomen to administer the shot.   She is having some bruising at the site of the injections but no bleeding, or blood in the urine, stool, or when she is brushing her teeth.  She reports she is not having any signs or symptoms concerning for recurrent VTE such as leg pain, leg swelling, chest pain, or shortness of breath.  She denies fevers, chills, night sweats, shortness of breath, chest pain, cough, nausea, vomiting, diarrhea, constipation or peripheral edema. She has no other complaints. Rest of the 10 point ROS is below.   MEDICAL HISTORY:  Past Medical History:  Diagnosis Date   Anxiety    anxiety and mild depressive order systoms   Bipolar disorder (HCC)     (02/20/2013)   Cervical cancer (HCC) 03/19/1980   Cholelithiasis 11/11/2021   Depression    Dysrhythmia    Headache(784.0)    "monthly" (02/20/2013)   Hyperlipidemia 11/11/2021   Pacemaker    medtronic   PONV (postoperative nausea and vomiting)    Sinus pause 02/14/2013   Syncope and collapse    echo-April 12,2012-EF 55% Echo normal; recorder with prolonged sinus pauses --> status post   Tobacco abuse    Vertigo     SURGICAL HISTORY: Past Surgical History:  Procedure Laterality Date   ABDOMINAL HYSTERECTOMY  03/19/1980   "partial"   BACK SURGERY     CERVICAL FUSION Left 11/2013   C4-C6   CERVICAL FUSION     3,4,5,   INSERT / REPLACE / REMOVE PACEMAKER  02/20/2013   medtronic   LOOP RECORDER IMPLANT N/A 11/06/2012   Procedure: LINQ LOOP RECORDER IMPLANT;  Surgeon: Thurmon Fair, MD;  Location: [facility]ATH [facility];  Service: Cardiovascular;  Laterality: N/A;   NECK SURGERY  11/17/2013   PACEMAKER PLACEMENT N/A 02/16/2013   PERMANENT PACEMAKER INSERTION N/A 02/20/2013   Procedure: PERMANENT PACEMAKER INSERTION;  Surgeon: Thurmon Fair, MD;  Location: [facility] CATH [facility];  Service: Cardiovascular;  Laterality: N/A;   POSTERIOR LUMBAR FUSION  ~ 2009   ROBOTIC ASSISTED SALPINGO OOPHERECTOMY Bilateral 11/25/2018   Procedure: XI ROBOTIC ASSISTED BILATERAL SALPINGO OOPHORECTOMY  and LYSIS OF ADHESIONS.;  Surgeon: Adolphus Birchwood, MD;  Location: [facility];  Service: Gynecology;  Laterality: Bilateral;   SPINAL FUSION  03/19/2014   TONSILLECTOMY     TRANSTHORACIC ECHOCARDIOGRAM  07/11/2010   The left atrial size is normal.There is no evidence of mitral vavle prolaspe.Right ventriicular systolic pressure is normal . Injection of contrast documented no interatrial shunt. Essentially normal 2D echo -doppler study     SOCIAL HISTORY: Social History   Socioeconomic History   Marital status: Widowed    Spouse name: Not on file   Number of children: 3   Years of education: 63   Highest education level: Not on file  Occupational History   Not on file  Tobacco Use   Smoking status: Every Day    Current packs/day: 0.50    Average packs/day: 0.5 packs/day for 40.0 years (20.0 ttl pk-yrs)    Types: Cigarettes   Smokeless tobacco: Never  Vaping Use   Vaping status: Never Used  Substance and Sexual Activity   Alcohol use: No   Drug use: No   Sexual activity: Yes  Other Topics Concern   Not on file  Social History Narrative   Married 29 years . Has 3 children. Current smoker -1 pack a day-for 36 years .Alcohol :  1 beer on occasion.   She is soon to be a grandmother.  His before to going on a trip up to the [place] area to be closer to family when the baby is born.  She is extremely excited.   She very much would like to move back up to that area to be closer to family, but the whether is it just too cold in the winter.   Right handed    Soda daily   Lives alone   Social Drivers of Health   Financial Resource Strain: Low Risk  (10/11/2020)   Overall Financial Resource Strain (CARDIA)    Difficulty of Paying Living Expenses: Not hard at all  Food Insecurity: No Food Insecurity (11/06/2022)   Hunger Vital Sign    Worried About Running Out of Food in the Last Year: Never true    Ran Out of Food in the Last Year: Never true  Transportation Needs: No  Transportation Needs (11/06/2022)   PRAPARE - Administrator, Civil Service (Medical): No    Lack of Transportation (Non-Medical): No  Physical Activity: Not on file  Stress: No Stress Concern Present (10/11/2020)   [employer] of Occupational Health - Occupational Stress Questionnaire    Feeling of Stress : Only a little  Social Connections: Not on file  Intimate Partner Violence: Not At Risk (11/22/2021)   Humiliation, Afraid, Rape, and Kick questionnaire    Fear of Current or Ex-Partner: No    Emotionally Abused: No    Physically Abused: No    Sexually Abused: No    FAMILY HISTORY: Family History  Adopted: Yes  Problem Relation Age of Onset   Heart attack Brother     ALLERGIES:  is allergic to penicillins.  MEDICATIONS:  Current Outpatient Medications  Medication Sig Dispense Refill   benzonatate (TESSALON) 100 MG capsule Take 1 capsule (100 mg total) by mouth 3 (three) times daily as needed for cough. 21 capsule 0   benztropine (COGENTIN) 1 MG tablet TAKE 1 TABLET BY MOUTH TWICE A DAY 180 tablet 0   divalproex (DEPAKOTE) 250 MG DR tablet TAKE 1 TABLET (250 MG TOTAL) BY MOUTH IN THE MORNING, AT NOON, AND AT BEDTIME. 270 tablet 0   donepezil (ARICEPT) 5 MG tablet Take 5 mg by mouth daily.     enoxaparin (LOVENOX) 60 MG/0.6ML injection INJECT 0.5 MLS (50 MG TOTAL) INTO THE SKIN 2 (TWO) TIMES DAILY 30 mL 3   fluticasone (FLONASE) 50 MCG/ACT nasal spray Place 2 sprays into both nostrils daily for 7 days. 1 g 0   fluvoxaMINE (LUVOX) 50 MG tablet TAKE 1 TABLET BY MOUTH EVERYDAY AT BEDTIME 90 tablet 0   lactulose (CHRONULAC) 10 GM/15ML solution Take 15 mLs (10 g total) by mouth 2 (two) times daily as needed for mild constipation. Titrate for 2-3 BM daily. Hold if loose stool. (Patient not taking: Reported on 12/12/2022) 236 mL 0   LORazepam (ATIVAN) 1 MG tablet one po every day prn anxiety or insomnia 30 tablet 3   midodrine (PROAMATINE) 2.5 MG tablet Take 1 tablet (2.5  mg total) by mouth 3 (three) times daily with meals. 90 tablet 0   omeprazole (PRILOSEC) 40 MG capsule Take 40 mg by mouth daily as needed (acid reflux).     QUEtiapine (SEROQUEL) 100 MG tablet TAKE 1 TABLET BY MOUTH EVERYDAY AT BEDTIME 90 tablet 2   No current facility-administered medications for this visit.    REVIEW OF SYSTEMS:   Constitutional: ( - ) fevers, ( - )  chills , ( - ) night sweats Eyes: ( - ) blurriness of vision, ( - ) double vision, ( - ) watery eyes Ears, nose, mouth, throat, and face: ( - ) mucositis, ( - ) sore throat Respiratory: ( - ) cough, ( - ) dyspnea, ( - ) wheezes Cardiovascular: ( - ) palpitation, ( - ) chest discomfort, ( - ) lower extremity swelling Gastrointestinal:  ( - ) nausea, ( - ) heartburn, ( - ) change in bowel habits Skin: ( - ) abnormal skin rashes Lymphatics: ( - ) new lymphadenopathy, ( - ) easy bruising Neurological: ( - ) numbness, ( - ) tingling, ( - ) new weaknesses Behavioral/Psych: ( - ) mood change, ( - ) new changes  All other systems were reviewed with the patient and are negative.  PHYSICAL EXAMINATION: ECOG PERFORMANCE STATUS: 1 - Symptomatic but completely ambulatory  Vitals:   02/28/23 1459  BP: 134/85  Pulse: 68  Resp: 13  Temp: 97.9 F (36.6 C)  SpO2: 100%   Filed Weights   02/28/23 1459  Weight: 116 lb 4.8 oz (52.8 kg)    GENERAL:thin appearing female in NAD  SKIN: skin color, texture, turgor are normal, no rashes or significant lesions EYES: conjunctiva are pink and non-injected, sclera clear OROPHARYNX: no exudate, no erythema; lips, buccal mucosa, and tongue normal  NECK: supple, non-tender LYMPH:  no palpable lymphadenopathy in the cervical or supraclavicular lymph nodes.  LUNGS: clear to auscultation and percussion with normal breathing effort HEART: regular rate & rhythm and no murmurs and no lower extremity edema Musculoskeletal: no cyanosis of digits and no clubbing  PSYCH: alert & oriented x 3, fluent  speech NEURO: no focal motor/sensory deficits  LABORATORY DATA:  I have reviewed the data as listed    Latest Ref Rng & Units 02/28/2023    2:40 PM 01/05/2023    2:05 AM 09/14/2022    2:04 PM  CBC  WBC 4.0 - 10.5 K/uL 7.4  9.0  6.6   Hemoglobin 12.0 - 15.0 g/dL 11.9  14.7  82.9   Hematocrit 36.0 - 46.0 % 45.2  40.3  44.0   Platelets 150 - 400 K/uL 257  369  228        Latest Ref Rng & Units 02/28/2023    2:40 PM 01/04/2023    6:22 PM 09/14/2022    2:04 PM  CMP  Glucose 70 - 99 mg/dL 99  562  130   BUN 8 - 23 mg/dL 9  9  12    Creatinine 0.44 - 1.00 mg/dL 8.65  7.84  6.96   Sodium 135 - 145 mmol/L 144  140  139   Potassium 3.5 - 5.1 mmol/L 4.0  3.7  4.5   Chloride 98 - 111 mmol/L 109  111  105   CO2 22 - 32 mmol/L 28  15  28    Calcium 8.9 - 10.3 mg/dL 9.5  9.6  29.5   Total Protein 6.5 - 8.1 g/dL 6.9  6.9  6.6   Total Bilirubin <1.2 mg/dL 0.6  1.3  0.6   Alkaline Phos 38 - 126 U/L 63  58  53   AST 15 - 41 U/L 9  20  9    ALT 0 - 44 U/L 5  12  5      RADIOGRAPHIC STUDIES: I have personally reviewed the radiological images as listed and agreed with the findings in the report. No results found.  ASSESSMENT & PLAN [name]  is a 67 y.o. female who presents for follow-up for recent DVT and PE, concerning for antiphospholipid syndrome.   #Recent DVT and PE #Antiphospholipid Antibody Syndrome -- Currently on Lovenox 1 mg/kg twice daily --Tolerating anticoagulation without any prohibitive toxicities including bleeding.  --DOAC therapy is not appropriate in the setting of concern for antiphospholipid antibody syndrome --Hypercoagulable testing from 10/27/2021 showed beta-2 glycoprotein IgM elevated greater than 150 and anticardiolipin antibodies IgM elevated above 103.5.Levels remained elevated in Dec 2023, confirming the diagnosis of APS -- Labs today were reviewed and require no intervention. Labs show WBC 7.4, Hgb 15.0, MCV 91.3, Plt 257  --RTC in 6 months with labs.     No orders of the defined types were placed in this encounter.   All questions were answered. The patient knows to call the clinic with any problems, questions or concerns.  I have spent a total of 25 minutes minutes of face-to-face and non-face-to-face time, preparing to see the patient, performing a medically appropriate examination, counseling and educating the patient,  documenting clinical information in the electronic health record,  and care coordination.   Ulysees Barns, MD Department of Hematology/Oncology [facility] Phone: [phone] Pager: [phone] Email: [email or web address]

## 2023-03-05 ENCOUNTER — Ambulatory Visit: Payer: Self-pay

## 2023-03-05 NOTE — Patient Outreach (Signed)
  Care Coordination   03/05/2023 Name: [name] MRN: [id number] DOB: 1955/08/04   Care Coordination Outreach Attempts:  An unsuccessful outreach was attempted for an appointment today.  Follow Up Plan:  Additional outreach attempts will be made to offer the patient complex care management information and services.   Encounter Outcome:  No Answer    Care Coordination Interventions:  No, not indicated    Harutyun Monteverde Idelle Jo, RN, MSN RN Care Manager [facility], Population Health Direct Dial: [phone]  Fax: [phone] Website: [email or web address]

## 2023-03-19 ENCOUNTER — Other Ambulatory Visit: Payer: Self-pay | Admitting: Physician Assistant

## 2023-03-19 NOTE — Telephone Encounter (Signed)
Please schedule pt an appt. LV 09/3 and canceled 10/16

## 2023-03-21 ENCOUNTER — Telehealth: Payer: Self-pay

## 2023-03-21 NOTE — Patient Outreach (Signed)
  Care Coordination   03/21/2023 Name: [name] MRN: [id number] DOB: [birth year]   Care Coordination Outreach Attempts:  A second unsuccessful outreach was attempted today to offer the patient with information about available complex care management services.  Follow Up Plan:  Additional outreach attempts will be made to offer the patient complex care management information and services.   Encounter Outcome:  No Answer   Care Coordination Interventions:  No, not indicated    Broady Lafoy J Azayla Polo, RN, MSN RN Care Manager [facility], Population Health Direct Dial: [phone]  Fax: [phone] Website: [email or web address]

## 2023-03-26 DIAGNOSIS — M47812 Spondylosis without myelopathy or radiculopathy, cervical region: Secondary | ICD-10-CM | POA: Diagnosis not present

## 2023-03-26 DIAGNOSIS — M961 Postlaminectomy syndrome, not elsewhere classified: Secondary | ICD-10-CM | POA: Diagnosis not present

## 2023-03-26 DIAGNOSIS — M5459 Other low back pain: Secondary | ICD-10-CM | POA: Diagnosis not present

## 2023-03-26 DIAGNOSIS — M5416 Radiculopathy, lumbar region: Secondary | ICD-10-CM | POA: Diagnosis not present

## 2023-03-26 DIAGNOSIS — G894 Chronic pain syndrome: Secondary | ICD-10-CM | POA: Diagnosis not present

## 2023-03-27 ENCOUNTER — Telehealth: Payer: Self-pay

## 2023-03-27 NOTE — Patient Outreach (Signed)
  Care Coordination   03/27/2023 Name: [name] MRN: [id number] DOB: 1956/02/23   Care Coordination Outreach Attempts:  A third unsuccessful outreach was attempted today to offer the patient with information about available complex care management services.  Follow Up Plan:  No further outreach attempts will be made at this time. We have been unable to contact the patient to offer or enroll patient in complex care management services.  Encounter Outcome:  No Answer   Care Coordination Interventions:  No, not indicated    [name] [name], RN, MSN RN Care Manager [facility], Population Health Direct Dial: [phone]  Fax: [phone] Website: [email or web address]

## 2023-04-18 ENCOUNTER — Other Ambulatory Visit: Payer: Self-pay | Admitting: Physician Assistant

## 2023-04-18 NOTE — Telephone Encounter (Signed)
Please schedule pt an appt lv 09/3 due back in 6 weeks.

## 2023-04-22 NOTE — Telephone Encounter (Signed)
Lvm for pt at 10:40a to schedule follow up appt

## 2023-04-26 ENCOUNTER — Telehealth: Payer: Self-pay

## 2023-04-26 NOTE — Telephone Encounter (Signed)
 Ok, she needs to be seen in office for more.

## 2023-04-26 NOTE — Telephone Encounter (Signed)
 Pt was last seen on 11/20/22 and was due for follow up at 6 weeks, I do not see any upcoming appointments scheduled.   Refills have been approved, just want to make sure [name] is aware and agreed.

## 2023-04-26 NOTE — Telephone Encounter (Signed)
 Will need to get apt scheduled for more refills.

## 2023-04-30 NOTE — Telephone Encounter (Signed)
LM to schedule her next appt

## 2023-05-01 ENCOUNTER — Other Ambulatory Visit: Payer: Self-pay | Admitting: Physician Assistant

## 2023-06-18 DIAGNOSIS — R300 Dysuria: Secondary | ICD-10-CM | POA: Diagnosis not present

## 2023-06-18 DIAGNOSIS — D6859 Other primary thrombophilia: Secondary | ICD-10-CM | POA: Diagnosis not present

## 2023-06-18 DIAGNOSIS — R569 Unspecified convulsions: Secondary | ICD-10-CM | POA: Diagnosis not present

## 2023-06-18 DIAGNOSIS — I959 Hypotension, unspecified: Secondary | ICD-10-CM | POA: Diagnosis not present

## 2023-06-18 DIAGNOSIS — Z79899 Other long term (current) drug therapy: Secondary | ICD-10-CM | POA: Diagnosis not present

## 2023-06-20 ENCOUNTER — Encounter: Payer: Self-pay | Admitting: Neurology

## 2023-06-20 ENCOUNTER — Ambulatory Visit: Payer: 59 | Admitting: Neurology

## 2023-06-21 DIAGNOSIS — J069 Acute upper respiratory infection, unspecified: Secondary | ICD-10-CM | POA: Diagnosis not present

## 2023-07-04 ENCOUNTER — Other Ambulatory Visit: Payer: Self-pay | Admitting: *Deleted

## 2023-07-04 DIAGNOSIS — R350 Frequency of micturition: Secondary | ICD-10-CM | POA: Diagnosis not present

## 2023-07-04 DIAGNOSIS — Z86711 Personal history of pulmonary embolism: Secondary | ICD-10-CM | POA: Diagnosis not present

## 2023-07-04 DIAGNOSIS — J439 Emphysema, unspecified: Secondary | ICD-10-CM | POA: Diagnosis not present

## 2023-07-04 DIAGNOSIS — R7309 Other abnormal glucose: Secondary | ICD-10-CM | POA: Diagnosis not present

## 2023-07-04 DIAGNOSIS — Z86718 Personal history of other venous thrombosis and embolism: Secondary | ICD-10-CM | POA: Diagnosis not present

## 2023-07-04 DIAGNOSIS — R531 Weakness: Secondary | ICD-10-CM | POA: Diagnosis not present

## 2023-07-04 DIAGNOSIS — R062 Wheezing: Secondary | ICD-10-CM | POA: Diagnosis not present

## 2023-07-04 MED ORDER — QUETIAPINE FUMARATE 100 MG PO TABS: 100.0000 mg | ORAL_TABLET | Freq: Every day | ORAL | 0 refills | Status: DC

## 2023-07-08 DIAGNOSIS — M5416 Radiculopathy, lumbar region: Secondary | ICD-10-CM | POA: Diagnosis not present

## 2023-07-08 DIAGNOSIS — M961 Postlaminectomy syndrome, not elsewhere classified: Secondary | ICD-10-CM | POA: Diagnosis not present

## 2023-07-08 DIAGNOSIS — G894 Chronic pain syndrome: Secondary | ICD-10-CM | POA: Diagnosis not present

## 2023-07-08 DIAGNOSIS — M47812 Spondylosis without myelopathy or radiculopathy, cervical region: Secondary | ICD-10-CM | POA: Diagnosis not present

## 2023-07-08 DIAGNOSIS — M5459 Other low back pain: Secondary | ICD-10-CM | POA: Diagnosis not present

## 2023-07-10 ENCOUNTER — Other Ambulatory Visit: Payer: Self-pay

## 2023-07-10 DIAGNOSIS — Z87891 Personal history of nicotine dependence: Secondary | ICD-10-CM

## 2023-07-10 DIAGNOSIS — Z122 Encounter for screening for malignant neoplasm of respiratory organs: Secondary | ICD-10-CM

## 2023-07-10 DIAGNOSIS — F17210 Nicotine dependence, cigarettes, uncomplicated: Secondary | ICD-10-CM

## 2023-07-23 ENCOUNTER — Ambulatory Visit
Admission: RE | Admit: 2023-07-23 | Discharge: 2023-07-23 | Disposition: A | Discharge status: Home / Self Care | Source: Ambulatory Visit | Attending: Family Medicine | Admitting: Family Medicine

## 2023-07-23 DIAGNOSIS — F17210 Nicotine dependence, cigarettes, uncomplicated: Secondary | ICD-10-CM | POA: Diagnosis not present

## 2023-07-23 DIAGNOSIS — Z87891 Personal history of nicotine dependence: Secondary | ICD-10-CM

## 2023-07-23 DIAGNOSIS — Z122 Encounter for screening for malignant neoplasm of respiratory organs: Secondary | ICD-10-CM | POA: Diagnosis not present

## 2023-07-24 ENCOUNTER — Encounter: Payer: Self-pay | Admitting: Neurology

## 2023-07-24 ENCOUNTER — Ambulatory Visit (INDEPENDENT_AMBULATORY_CARE_PROVIDER_SITE_OTHER): Admitting: Neurology

## 2023-07-24 VITALS — BP 129/87 | HR 105 | Ht 67.0 in | Wt 116.0 lb

## 2023-07-24 DIAGNOSIS — G4700 Insomnia, unspecified: Secondary | ICD-10-CM

## 2023-07-24 DIAGNOSIS — F419 Anxiety disorder, unspecified: Secondary | ICD-10-CM

## 2023-07-24 DIAGNOSIS — R251 Tremor, unspecified: Secondary | ICD-10-CM | POA: Diagnosis not present

## 2023-07-24 DIAGNOSIS — D6861 Antiphospholipid syndrome: Secondary | ICD-10-CM | POA: Diagnosis not present

## 2023-07-24 MED ORDER — CLONAZEPAM 0.5 MG PO TABS
0.5000 mg | ORAL_TABLET | Freq: Two times a day (BID) | ORAL | 3 refills | Status: AC
Start: 2023-07-24 — End: ?

## 2023-07-24 NOTE — Progress Notes (Signed)
 [facility]  PATIENT: [name] DOB: 1955-08-16  REFERRING DOCTOR OR PCP: Dr. Lanae Pinal SOURCE: Patient, notes from Dr. Vaughn Georges, imaging reports and lumbar MRI and CT images personally reviewed.   _________________________________   HISTORICAL  CHIEF COMPLAINT:  Chief Complaint  Patient presents with   Follow-up    Pt in 11 alone Pt here for tremors Pt states tremors are better Pt states high anxiety Pt states she feels she is going to have a nervous breakdown     HISTORY OF PRESENT ILLNESS:  [name] is a 68 y.o. woman with reduced memory and poor gait.  07/24/2023 She has a tremor in her hands and lips. This also affects her handwriting.    She feels anxiety is worse and sometimes has panic attacks.   In the past tremor was a little better with benzodiazepines.   She no longer has lorazepam .     She reports insomnia, worse since lorazepam  ran out..    She has an antiphospolipid antibody.     She is on Lovenox  for the dural venous thrombosis.   She was briefly on coumadin  late summer 2023 but it was stopped as nontherapeutic.  Genrrally for APL, coumadin  is recommended over a DOAC.    She had issues with low and high INRs and coumadin  was switched over to lovenox .     She saw Rheum and she was felt not to have SLE.     She has mild cognitive impairment stable.  She feels if anything, hr memory is better now compared to earlier this year.  No recent spell of confusion or hallucination as she had earlier in 2024.  Both memory and confusion are better than last visit.     She has orthostatic hypotension and falls last summer.  She was started on midodrine  for the orthostatic hypotension.  This has helped and no longer has spells of LH.  Echocardiogram was normal.   History of thrombosis/APL syndrome In August 2023, she had mental status changes and seizures.  The CT head showed "Filling defect within the left sigmoid sinus and jugular vein is  suspicious for nonocclusive thrombus. Consider confirmation with dedicated CT venogram of the head. "   CT venogram confirmed filling defects in left transverse and sigmoid sinus and internal jugular vein.  She also had blood clots in her legs.  This led to a hospitalization in [place]  and she was placed on Lovenox .  For the seizure she was placed on Depakote .  Some medications were changed.  Due to memory issues, Xanax  was discontinued.  Antiphospholipid syndrome was assumed.  Initially she was placed on Lovenox  and for a while was on a combination Lovenox  and Coumadin  and then Coumadin .  However, he had difficulty with both supratherapeutic and subtherapeutic levels on Coumadin  so was placed back on the Lovenox  and continues on that.    I had seen her a couple times in 2021 for fibromyalgia type symptoms, gait disturbance and memory loss.  Labs in 2021 showed low B12 (146 with 180-914 normal).  CK was elevated at 836 (<234 is normal) while hospitalized but was normal when we checked at the last visit.    TSH, ammonia, lactic acid was normal.        07/24/2023    1:20 PM  MMSE - Mini Mental State Exam  Orientation to time 5  Orientation to Place 5  Registration 3  Attention/ Calculation 4  Recall 2  Language- name 2  objects 1  Language- repeat 1  Language- follow 3 step command 3  Language- read & follow direction 1  Write a sentence 1  Copy design 0  Total score 26       02/28/2022    5:01 PM 02/28/2022    4:06 PM 01/04/2020   11:08 AM 07/02/2019    9:26 AM  Montreal Cognitive Assessment   Visuospatial/ Executive (0/5) 2 2 2 2   Naming (0/3) 3 3 3 2   Attention: Read list of digits (0/2) 2 2 2 2   Attention: Read list of letters (0/1) 1 1 1 1   Attention: Serial 7 subtraction starting at 100 (0/3) 2 2 1 2   Language: Repeat phrase (0/2) 1 1 2 2   Language : Fluency (0/1) 0 0 0 0  Abstraction (0/2) 2 2 2 2   Delayed Recall (0/5) 4 4 3 2   Orientation (0/6) 5 5 5 5   Total 22 22 21  20   Adjusted Score (based on education) 23  22 21      Impression: This NCV/EMG study shows the following: 1. Most of the proximal muscles showed small polyphasic motor units that recruited normally. This could be consistent with a mild myopathy. 2. Although the peroneal motor responses had reduced amplitudes, muscles innervated by that nerve did not show a neuropathic pattern. Other motor and sensory nerves were essentially normal and this is likely to be an incidental finding rather than an indicator of neuropathy 3.   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  Brain parenchyma appeared normal.  There were no acute findings.  CT scan lumbar spine 01/06/2019 showed prior fusion and posterior decompression at the L4-5 level with incorporation within the intervertebral disc spacer, however minimal incorporation across the remaining endplates at this level. Hardware is intact without evidence of loosening.   Probable mild canal stenosis at the L3-4 level.  L4-5 far lateral disc material appears to contact the exited left L4 nerve root. . Central disc protrusion at L5-S1 without evidence of canal stenosis.  MRI of the lumbar spine 09/11/2005 showed mild spinal stenosis at L4-L5 due to disc bulging, facet hypertrophy and ligamenta flava hypertrophy.  There was lateral recess stenosis.  L5-S1 showed facet hypertrophy but no spinal stenosis or foraminal narrowing.  Other levels appeared normal.  Other labs:  The AB42/40 ratio and pTu181 were normal.   NFL slightly increased at 4.99 (<4.6).    REVIEW OF SYSTEMS: Constitutional: No fevers, chills, sweats, or change in appetite Eyes: No visual changes, double vision, eye pain Ear, nose and throat: No hearing loss, ear pain, nasal congestion,  sore throat Cardiovascular: No chest pain, palpitations Respiratory:  No shortness of breath at rest or with exertion.   No wheezes GastrointestinaI: No nausea, vomiting, diarrhea, abdominal pain, fecal incontinence Genitourinary:  No dysuria, urinary retention or frequency.  No nocturia. Musculoskeletal: As above  integumentary: No rash, pruritus, skin lesions Neurological: as above Psychiatric: No depression at this time.  No anxiety Endocrine: No palpitations, diaphoresis, change in appetite, change in weigh or increased thirst Hematologic/Lymphatic:  No anemia, purpura, petechiae. Allergic/Immunologic: No itchy/runny eyes, nasal congestion, recent allergic reactions, rashes  ALLERGIES: Allergies  Allergen Reactions   Penicillins Other (See Comments)    Yeast infection Did it involve swelling of the face/tongue/throat, SOB, or  low BP? No Did it involve sudden or severe rash/hives, skin peeling, or any reaction on the inside of your mouth or nose? No Did you need to seek medical attention at a hospital or doctor's office? Yes When did it last happen? More than 5 years ago  If all above answers are "NO", may proceed with cephalosporin use.     HOME MEDICATIONS:  Current Outpatient Medications:    benztropine  (COGENTIN ) 1 MG tablet, TAKE 1 TABLET BY MOUTH TWICE A DAY, Disp: 60 tablet, Rfl: 0   divalproex  (DEPAKOTE ) 250 MG DR tablet, TAKE 1 TABLET (250 MG TOTAL) BY MOUTH IN THE MORNING, AT NOON, AND AT BEDTIME., Disp: 270 tablet, Rfl: 0   enoxaparin  (LOVENOX ) 60 MG/0.6ML injection, INJECT 0.5 MLS (50 MG TOTAL) INTO THE SKIN 2 (TWO) TIMES DAILY, Disp: 30 mL, Rfl: 3   fluvoxaMINE  (LUVOX ) 50 MG tablet, TAKE 1 TABLET BY MOUTH EVERYDAY AT BEDTIME, Disp: 90 tablet, Rfl: 0   midodrine  (PROAMATINE ) 2.5 MG tablet, Take 1 tablet (2.5 mg total) by mouth 3 (three) times daily with meals., Disp: 90 tablet, Rfl: 0   omeprazole  (PRILOSEC) 40 MG capsule, Take 40 mg by mouth daily as needed (acid  reflux)., Disp: , Rfl:    donepezil  (ARICEPT ) 5 MG tablet, Take 5 mg by mouth daily. (Patient not taking: Reported on 07/24/2023), Disp: , Rfl:   PAST MEDICAL HISTORY: Past Medical History:  Diagnosis Date   Anxiety    anxiety and mild depressive order systoms   Bipolar disorder (HCC)     (02/20/2013)   Cervical cancer (HCC) 03/19/1980   Cholelithiasis 11/11/2021   Depression    Dysrhythmia    Headache(784.0)    "monthly" (02/20/2013)   Hyperlipidemia 11/11/2021   Pacemaker    medtronic   PONV (postoperative nausea and vomiting)    Sinus pause 02/14/2013   Syncope and collapse    echo-April 12,2012-EF 55% Echo normal; recorder with prolonged sinus pauses --> status post   Tobacco abuse    Vertigo     PAST SURGICAL HISTORY: Past Surgical History:  Procedure Laterality Date   ABDOMINAL HYSTERECTOMY  03/19/1980   "partial"   BACK SURGERY     CERVICAL FUSION Left 11/2013   C4-C6   CERVICAL FUSION     3,4,5,   INSERT / REPLACE / REMOVE PACEMAKER  02/20/2013   medtronic   LOOP RECORDER IMPLANT N/A 11/06/2012   Procedure: LINQ LOOP RECORDER IMPLANT;  Surgeon: Luana Rumple, MD;  Location: [facility];  Service: Cardiovascular;  Laterality: N/A;   NECK SURGERY  11/17/2013   PACEMAKER PLACEMENT N/A 02/16/2013   PERMANENT PACEMAKER INSERTION N/A 02/20/2013   Procedure: PERMANENT PACEMAKER INSERTION;  Surgeon: Luana Rumple, MD;  Location: [facility];  Service: Cardiovascular;  Laterality: N/A;   POSTERIOR LUMBAR FUSION  ~ 2009   ROBOTIC ASSISTED SALPINGO OOPHERECTOMY Bilateral 11/25/2018   Procedure: XI ROBOTIC ASSISTED BILATERAL SALPINGO OOPHORECTOMY and LYSIS OF ADHESIONS.;  Surgeon: Alphonso Aschoff, MD;  Location: [facility];  Service: Gynecology;  Laterality: Bilateral;   SPINAL FUSION  03/19/2014   TONSILLECTOMY     TRANSTHORACIC ECHOCARDIOGRAM  07/11/2010   The left atrial size is normal.There is no evidence of mitral vavle prolaspe.Right ventriicular systolic pressure is normal .  Injection of contrast documented no interatrial shunt. Essentially normal 2D echo -doppler study     FAMILY HISTORY: Family History  Adopted: Yes  Problem Relation Age of Onset   Heart attack Brother  SOCIAL HISTORY:  Social History   Socioeconomic History   Marital status: Widowed    Spouse name: Not on file   Number of children: 3   Years of education: 66   Highest education level: Not on file  Occupational History   Not on file  Tobacco Use   Smoking status: Every Day    Current packs/day: 1.00    Average packs/day: 0.5 packs/day for 40.0 years (20.0 ttl pk-yrs)    Types: Cigarettes    Start date: 07/2023   Smokeless tobacco: Never  Vaping Use   Vaping status: Never Used  Substance and Sexual Activity   Alcohol  use: No   Drug use: No   Sexual activity: Yes  Other Topics Concern   Not on file  Social History Narrative   Married 29 years . Has 3 children. Current smoker -1 pack a day-for 36 years .Alcohol  :  1 beer on occasion.   She is soon to be a grandmother.  His before to going on a trip up to the [place]  area to be closer to family when the baby is born.  She is extremely excited.   She very much would like to move back up to that area to be closer to family, but the whether is it just too cold in the winter.   Right handed    Soda daily   Lives with daughter    Social Drivers of Health   Financial Resource Strain: Low Risk  (10/11/2020)   Overall Financial Resource Strain (CARDIA)    Difficulty of Paying Living Expenses: Not hard at all  Food Insecurity: No Food Insecurity (11/06/2022)   Hunger Vital Sign    Worried About Running Out of Food in the Last Year: Never true    Ran Out of Food in the Last Year: Never true  Transportation Needs: No Transportation Needs (11/06/2022)   PRAPARE - Administrator, Civil Service (Medical): No    Lack of Transportation (Non-Medical): No  Physical Activity: Not on file  Stress: No Stress  Concern Present (10/11/2020)   [name] [employer] Occupational [employer] - Occupational Stress Questionnaire    Feeling of Stress : Only a little  Social Connections: Not on file  Intimate Partner Violence: Not At Risk (11/22/2021)   Humiliation, Afraid, Rape, and Kick questionnaire    Fear of Current or Ex-Partner: No    Emotionally Abused: No    Physically Abused: No    Sexually Abused: No     PHYSICAL EXAM  Vitals:   07/24/23 1306  BP: 129/87  Pulse: (!) 105  Weight: 116 lb (52.6 kg)  Height: 5\' 7"  (1.702 m)    Body mass index is 18.17 kg/m.   General: The patient is well-developed and well-nourished and in no acute distress   Skin: Extremities are without rash or  edema.   Neurologic Exam  Mental status: Alert and oriented.  Focus/attention seems reduced in conversation Score MMSE 26/30 (see above).   Cranial nerves: Extraocular movements are full. There is good facial sensation to soft touch bilaterally.Facial strength is normal. No obvious hearing deficits are noted.  Motor:  She has a low amplitude fast tremor in L>R hand and in lips, similar to last visit.   Muscle bulk is normal.   Tone is normal.  No cogwheeling.  Strength is 5 / 5 in all 4 extremities except 4+/5 right EHL.     Sensory: She has mild reduced vibration in  the feet relative to the knees.  Sensation was normal in the arms.  Coordination: Cerebellar testing reveals good finger-nose-finger and heel-to-shin bilaterally.  Gait and station: Station is normal.  Gait is mildly wide.   Turn is mildly off balance. Tandem is wide.  She does not have retropulsion.  Romberg is negative.   Reflexes: Deep tendon reflexes are symmetric and normal bilaterally.       DIAGNOSTIC DATA (LABS, IMAGING, TESTING) - I reviewed patient records, labs, notes, testing and imaging myself where available.  Lab Results  Component Value Date   WBC 7.4 02/28/2023   HGB 15.0 02/28/2023   HCT 45.2 02/28/2023   MCV 91.3  02/28/2023   PLT 257 02/28/2023      Component Value Date/Time   NA 144 02/28/2023 1440   NA 139 01/04/2020 1147   K 4.0 02/28/2023 1440   CL 109 02/28/2023 1440   CO2 28 02/28/2023 1440   GLUCOSE 99 02/28/2023 1440   BUN 9 02/28/2023 1440   BUN 11 01/04/2020 1147   CREATININE 0.76 02/28/2023 1440   CREATININE 1.08 (H) 09/24/2018 1019   CALCIUM  9.5 02/28/2023 1440   PROT 6.9 02/28/2023 1440   PROT 6.6 01/04/2020 1147   ALBUMIN 4.4 02/28/2023 1440   ALBUMIN 3.4 (L) 02/25/2020 0910   AST 9 (L) 02/28/2023 1440   ALT <5 02/28/2023 1440   ALKPHOS 63 02/28/2023 1440   BILITOT 0.6 02/28/2023 1440   GFRNONAA >60 02/28/2023 1440   GFRNONAA 56 (L) 09/24/2018 1019   GFRAA 69 01/04/2020 1147   GFRAA 65 09/24/2018 1019   Lab Results  Component Value Date   CHOL 256 (H) 09/24/2018   HDL 50 09/24/2018   LDLCALC 179 (H) 09/24/2018   TRIG 130 09/24/2018   CHOLHDL 5.1 (H) 09/24/2018       ASSESSMENT AND PLAN  Antiphospholipid syndrome (HCC)  Tremor  Insomnia, unspecified type  Anxiety  1.  She has APL syndrome .  She is currently on Lovenox  for dural sinus venous thrombosis.      2.  MCI could be due to APL.  Labs not c/w Alzheimer and LBD unlikely 3.  She has tremor exacerbated by anxiety.  Will add clonazepam 0.5 mg po bid    4.  She will return to see us  in 6 months or sooner if there are new or worsening neurologic symptoms.    This visit is part of a comprehensive longitudinal care medical relationship regarding the patients primary diagnosis of antiphospholipid syndrome and related concerns.   Aariana Shankland A. Godwin Lat, MD, Laurita Porta 07/24/2023, 2:05 PM Certified in Neurology, Clinical Neurophysiology, Sleep Medicine and Neuroimaging  [facility] [address] [place], [place] [address] [phone]

## 2023-08-14 DIAGNOSIS — F17210 Nicotine dependence, cigarettes, uncomplicated: Secondary | ICD-10-CM | POA: Diagnosis not present

## 2023-08-14 DIAGNOSIS — J439 Emphysema, unspecified: Secondary | ICD-10-CM | POA: Diagnosis not present

## 2023-08-16 ENCOUNTER — Other Ambulatory Visit: Payer: Self-pay

## 2023-08-16 DIAGNOSIS — Z122 Encounter for screening for malignant neoplasm of respiratory organs: Secondary | ICD-10-CM

## 2023-08-16 DIAGNOSIS — Z87891 Personal history of nicotine dependence: Secondary | ICD-10-CM

## 2023-08-16 DIAGNOSIS — F17210 Nicotine dependence, cigarettes, uncomplicated: Secondary | ICD-10-CM

## 2023-08-19 ENCOUNTER — Telehealth: Payer: Self-pay

## 2023-08-19 NOTE — Telephone Encounter (Signed)
 Returned call and LVM. Advised annual Lung CT resulted last week and results sent to patient. Request call back if any additional information is needed.

## 2023-08-19 NOTE — Telephone Encounter (Signed)
 Copied from CRM [id number]. Topic: Clinical - Lab/Test Results >> Aug 16, 2023  9:31 AM Corean Deutscher wrote: Reason for CRM: Halona with [facility] called regarding the CT cardiac report for the patient done on 07/23/23. Halona would like a callback at [phone] regarding the results.  Please advise of LCS CT 5/6.

## 2023-08-28 ENCOUNTER — Ambulatory Visit: Admitting: Physician Assistant

## 2023-08-29 ENCOUNTER — Inpatient Hospital Stay: Payer: 59 | Attending: Hematology and Oncology

## 2023-08-29 ENCOUNTER — Inpatient Hospital Stay: Payer: 59 | Admitting: Hematology and Oncology

## 2023-08-29 ENCOUNTER — Other Ambulatory Visit: Payer: Self-pay | Admitting: Hematology and Oncology

## 2023-08-29 DIAGNOSIS — D6861 Antiphospholipid syndrome: Secondary | ICD-10-CM | POA: Insufficient documentation

## 2023-08-29 DIAGNOSIS — F17210 Nicotine dependence, cigarettes, uncomplicated: Secondary | ICD-10-CM | POA: Diagnosis not present

## 2023-08-29 DIAGNOSIS — Z86711 Personal history of pulmonary embolism: Secondary | ICD-10-CM | POA: Insufficient documentation

## 2023-08-29 DIAGNOSIS — Z86718 Personal history of other venous thrombosis and embolism: Secondary | ICD-10-CM | POA: Insufficient documentation

## 2023-08-29 DIAGNOSIS — Z7901 Long term (current) use of anticoagulants: Secondary | ICD-10-CM | POA: Insufficient documentation

## 2023-08-29 LAB — CMP (CANCER CENTER ONLY)
ALT: 8 U/L (ref 0–44)
AST: 14 U/L — ABNORMAL LOW (ref 15–41)
Albumin: 4.2 g/dL (ref 3.5–5.0)
Alkaline Phosphatase: 78 U/L (ref 38–126)
Anion gap: 11 (ref 5–15)
BUN: 12 mg/dL (ref 8–23)
CO2: 25 mmol/L (ref 22–32)
Calcium: 9.4 mg/dL (ref 8.9–10.3)
Chloride: 103 mmol/L (ref 98–111)
Creatinine: 1.01 mg/dL — ABNORMAL HIGH (ref 0.44–1.00)
GFR, Estimated: >60 mL/min (ref >60)
Glucose, Bld: 109 mg/dL — ABNORMAL HIGH (ref 70–99)
Potassium: 4.1 mmol/L (ref 3.5–5.1)
Sodium: 139 mmol/L (ref 135–145)
Total Bilirubin: 1 mg/dL (ref 0.0–1.2)
Total Protein: 7 g/dL (ref 6.5–8.1)

## 2023-08-29 LAB — CBC WITH DIFFERENTIAL (CANCER CENTER ONLY)
Abs Immature Granulocytes: 0.02 10*3/uL (ref 0.00–0.07)
Basophils Absolute: 0.1 10*3/uL (ref 0.0–0.1)
Basophils Relative: 1 %
Eosinophils Absolute: 0.1 10*3/uL (ref 0.0–0.5)
Eosinophils Relative: 1 %
HCT: 43.8 % (ref 36.0–46.0)
Hemoglobin: 14.6 g/dL (ref 12.0–15.0)
Immature Granulocytes: 0 %
Lymphocytes Relative: 29 %
Lymphs Abs: 2.5 10*3/uL (ref 0.7–4.0)
MCH: 28.9 pg (ref 26.0–34.0)
MCHC: 33.3 g/dL (ref 30.0–36.0)
MCV: 86.7 fL (ref 80.0–100.0)
Monocytes Absolute: 0.5 10*3/uL (ref 0.1–1.0)
Monocytes Relative: 6 %
Neutro Abs: 5.5 10*3/uL (ref 1.7–7.7)
Neutrophils Relative %: 63 %
Platelet Count: 386 10*3/uL (ref 150–400)
RBC: 5.05 MIL/uL (ref 3.87–5.11)
RDW: 14.1 % (ref 11.5–15.5)
WBC Count: 8.7 10*3/uL (ref 4.0–10.5)
nRBC: 0 % (ref 0.0–0.2)

## 2023-08-29 NOTE — Progress Notes (Signed)
 [facility] [phone]   [phone]  PROGRESS NOTE  Patient Care Team: Lanae Pinal, MD as PCP - General (Family Medicine) Issac Marine as Physician Assistant (Psychiatry)  Hematological/Oncological History 1) 10/23/2021-11/11/2021: Admitted In [place]  staying 45 seconds and witnessed by her daughter.  CT imaging revealed an acute right lobar, segmental and subsegmental pulmonary emboli.  Lower extremity Doppler was positive for DVT.  She was started on IV heparin .  Hypercoagulable testing was performed.  Findings revealed beta-2  glycoprotein IgM elevated greater than 150 and anticardiolipin antibodies IgM elevated above 103.5.Patient was discharged on Lovenox  bridging to Coumadin  due to concern for possible antiphospholipid syndrome.  2. 11/18/2021-11/26/2021: Readmitted at [facility] due to altered mental status and was found to have a UTI and therapeutic INR. Her INR was found to be 3.6 and subsequently 4.2 on 11/22/2021.  Patient was switched to Lovenox  1 mg/kg twice daily due to difficulty controlling her INRs.  3. 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 female returns for a follow up visit.  She is unaccompanied for this visit.  Mrs. Starkey reports she has been well overall in the room since her last visit.  She has had no hospitalizations or ER visits.  She was recently started on Klonopin  and trazodone .  She reports she faithfully takes her Lovenox  twice a day.  She reports she is not having any bleeding but does have some bruising on her abdomen where she gets her self the injections.  She is not having any bruising anywhere else such as her arms or legs.  She reports the medication is $0 per month.  She is not having any nausea, vomiting, or diarrhea.  She reports is not  having any signs or symptoms concerning for recurrent VTE such as leg pain, chest pain, or shortness of breath.  She reports nothing else out of the ordinary.  Overall she feels well presently and able to continue on Lovenox  therapy at this time.  A full 10 point ROS is otherwise negative.  MEDICAL HISTORY:  Past Medical History:  Diagnosis Date   Anxiety    anxiety and mild depressive order systoms   Bipolar disorder (HCC)     (02/20/2013)   Cervical cancer (HCC) 03/19/1980   Cholelithiasis 11/11/2021   Depression    Dysrhythmia    Headache(784.0)    monthly (02/20/2013)   Hyperlipidemia 11/11/2021   Pacemaker    medtronic   PONV (postoperative nausea and vomiting)    Sinus pause 02/14/2013   Syncope and collapse    echo-April 12,2012-EF 55% Echo normal; recorder with prolonged sinus pauses --> status post   Tobacco abuse    Vertigo     SURGICAL HISTORY: Past Surgical History:  Procedure Laterality Date   ABDOMINAL HYSTERECTOMY  03/19/1980   partial   BACK SURGERY     CERVICAL FUSION Left 11/2013   C4-C6   CERVICAL FUSION     3,4,5,   INSERT / REPLACE / REMOVE PACEMAKER  02/20/2013   medtronic   LOOP RECORDER IMPLANT N/A 11/06/2012   Procedure: LINQ LOOP RECORDER IMPLANT;  Surgeon: Luana Rumple, MD;  Location: [facility] CATH LAB;  Service: Cardiovascular;  Laterality: N/A;   NECK SURGERY  11/17/2013   PACEMAKER PLACEMENT N/A 02/16/2013   PERMANENT PACEMAKER INSERTION N/A 02/20/2013   Procedure: PERMANENT PACEMAKER INSERTION;  Surgeon: Luana Rumple, MD;  Location: [facility] CATH LAB;  Service: Cardiovascular;  Laterality: N/A;   POSTERIOR LUMBAR FUSION  ~ 2009   ROBOTIC ASSISTED SALPINGO OOPHERECTOMY Bilateral 11/25/2018   Procedure: XI ROBOTIC ASSISTED BILATERAL SALPINGO OOPHORECTOMY and LYSIS OF ADHESIONS.;  Surgeon: Alphonso Aschoff, MD;  Location: [facility] ORS;  Service: Gynecology;  Laterality: Bilateral;   SPINAL FUSION  03/19/2014   TONSILLECTOMY     TRANSTHORACIC ECHOCARDIOGRAM   07/11/2010   The left atrial size is normal.There is no evidence of mitral vavle prolaspe.Right ventriicular systolic pressure is normal . Injection of contrast documented no interatrial shunt. Essentially normal 2D echo -doppler study     SOCIAL HISTORY: Social History   Socioeconomic History   Marital status: Widowed    Spouse name: Not on file   Number of children: 3   Years of education: 56   Highest education level: Not on file  Occupational History   Not on file  Tobacco Use   Smoking status: Every Day    Current packs/day: 1.00    Average packs/day: 0.5 packs/day for 40.1 years (20.1 ttl pk-yrs)    Types: Cigarettes    Start date: 07/2023   Smokeless tobacco: Never  Vaping Use   Vaping status: Never Used  Substance and Sexual Activity   Alcohol  use: No   Drug use: No   Sexual activity: Yes  Other Topics Concern   Not on file  Social History Narrative   Married 29 years . Has 3 children. Current smoker -1 pack a day-for 36 years .Alcohol  :  1 beer on occasion.   She is soon to be a grandmother.  His before to going on a trip up to the [place]  area to be closer to family when the baby is born.  She is extremely excited.   She very much would like to move back up to that area to be closer to family, but the whether is it just too cold in the winter.   Right handed    Soda daily   Lives with daughter    Social Drivers of Health   Financial Resource Strain: Low Risk  (10/11/2020)   Overall Financial Resource Strain (CARDIA)    Difficulty of Paying Living Expenses: Not hard at all  Food Insecurity: No Food Insecurity (11/06/2022)   Hunger Vital Sign    Worried About Running Out of Food in the Last Year: Never true    Ran Out of Food in the Last Year: Never true  Transportation Needs: No Transportation Needs (11/06/2022)   PRAPARE - Administrator, Civil Service (Medical): No    Lack of Transportation (Non-Medical): No  Physical Activity: Not on  file  Stress: No Stress Concern Present (10/11/2020)   [employer] of Occupational Health - Occupational Stress Questionnaire    Feeling of Stress : Only a little  Social Connections: Not on file  Intimate Partner Violence: Not At Risk (11/22/2021)   Humiliation, Afraid, Rape, and Kick questionnaire    Fear of Current or Ex-Partner: No    Emotionally Abused: No    Physically Abused: No    Sexually Abused: No    FAMILY HISTORY: Family History  Adopted: Yes  Problem Relation Age of Onset   Heart attack Brother     ALLERGIES:  is allergic to penicillins.  MEDICATIONS:  Current Outpatient Medications  Medication Sig Dispense Refill   benztropine  (COGENTIN ) 1 MG tablet TAKE 1 TABLET BY MOUTH TWICE A  DAY 60 tablet 0   clonazePAM  (KLONOPIN ) 0.5 MG tablet Take 1 tablet (0.5 mg total) by mouth 2 (two) times daily. 60 tablet 3   divalproex  (DEPAKOTE ) 250 MG DR tablet TAKE 1 TABLET (250 MG TOTAL) BY MOUTH IN THE MORNING, AT NOON, AND AT BEDTIME. 270 tablet 0   donepezil  (ARICEPT ) 5 MG tablet Take 5 mg by mouth daily. (Patient not taking: Reported on 07/24/2023)     enoxaparin  (LOVENOX ) 60 MG/0.6ML injection INJECT 0.5 MLS (50 MG TOTAL) INTO THE SKIN 2 (TWO) TIMES DAILY 30 mL 3   fluvoxaMINE  (LUVOX ) 50 MG tablet TAKE 1 TABLET BY MOUTH EVERYDAY AT BEDTIME 90 tablet 0   midodrine  (PROAMATINE ) 2.5 MG tablet Take 1 tablet (2.5 mg total) by mouth 3 (three) times daily with meals. 90 tablet 0   omeprazole  (PRILOSEC) 40 MG capsule Take 40 mg by mouth daily as needed (acid reflux).     No current facility-administered medications for this visit.    REVIEW OF SYSTEMS:   Constitutional: ( - ) fevers, ( - )  chills , ( - ) night sweats Eyes: ( - ) blurriness of vision, ( - ) double vision, ( - ) watery eyes Ears, nose, mouth, throat, and face: ( - ) mucositis, ( - ) sore throat Respiratory: ( - ) cough, ( - ) dyspnea, ( - ) wheezes Cardiovascular: ( - ) palpitation, ( - ) chest discomfort, ( - )  lower extremity swelling Gastrointestinal:  ( - ) nausea, ( - ) heartburn, ( - ) change in bowel habits Skin: ( - ) abnormal skin rashes Lymphatics: ( - ) new lymphadenopathy, ( - ) easy bruising Neurological: ( - ) numbness, ( - ) tingling, ( - ) new weaknesses Behavioral/Psych: ( - ) mood change, ( - ) new changes  All other systems were reviewed with the patient and are negative.  PHYSICAL EXAMINATION: ECOG PERFORMANCE STATUS: 1 - Symptomatic but completely ambulatory  There were no vitals filed for this visit.  There were no vitals filed for this visit.   GENERAL:thin appearing female in NAD  SKIN: skin color, texture, turgor are normal, no rashes or significant lesions EYES: conjunctiva are pink and non-injected, sclera clear OROPHARYNX: no exudate, no erythema; lips, buccal mucosa, and tongue normal  NECK: supple, non-tender LYMPH:  no palpable lymphadenopathy in the cervical or supraclavicular lymph nodes.  LUNGS: clear to auscultation and percussion with normal breathing effort HEART: regular rate & rhythm and no murmurs and no lower extremity edema Musculoskeletal: no cyanosis of digits and no clubbing  PSYCH: alert & oriented x 3, fluent speech NEURO: no focal motor/sensory deficits  LABORATORY DATA:  I have reviewed the data as listed    Latest Ref Rng & Units 08/29/2023    1:41 PM 02/28/2023    2:40 PM 01/05/2023    2:05 AM  CBC  WBC 4.0 - 10.5 K/uL 8.7  7.4  9.0   Hemoglobin 12.0 - 15.0 g/dL 09.8  11.9  14.7   Hematocrit 36.0 - 46.0 % 43.8  45.2  40.3   Platelets 150 - 400 K/uL 386  257  369        Latest Ref Rng & Units 02/28/2023    2:40 PM 01/04/2023    6:22 PM 09/14/2022    2:04 PM  CMP  Glucose 70 - 99 mg/dL 99  829  562   BUN 8 - 23 mg/dL 9  9  12    Creatinine  0.44 - 1.00 mg/dL 4.54  0.98  1.19   Sodium 135 - 145 mmol/L 144  140  139   Potassium 3.5 - 5.1 mmol/L 4.0  3.7  4.5   Chloride 98 - 111 mmol/L 109  111  105   CO2 22 - 32 mmol/L 28  15   28    Calcium  8.9 - 10.3 mg/dL 9.5  9.6  14.7   Total Protein 6.5 - 8.1 g/dL 6.9  6.9  6.6   Total Bilirubin <1.2 mg/dL 0.6  1.3  0.6   Alkaline Phos 38 - 126 U/L 63  58  53   AST 15 - 41 U/L 9  20  9    ALT 0 - 44 U/L 5  12  5      RADIOGRAPHIC STUDIES: I have personally reviewed the radiological images as listed and agreed with the findings in the report. No results found.  ASSESSMENT & PLAN ALANDRIA BUTKIEWICZ  is a 68 y.o. female who presents for follow-up for recent DVT and PE, concerning for antiphospholipid syndrome.  #Recent DVT and PE #Antiphospholipid Antibody Syndrome -- Currently on Lovenox  1 mg/kg subq twice daily --Tolerating anticoagulation without any prohibitive toxicities including bleeding.  --DOAC therapy is not appropriate in the setting of concern for antiphospholipid antibody syndrome --Hypercoagulable testing from 10/27/2021 showed beta-2  glycoprotein IgM elevated greater than 150 and anticardiolipin antibodies IgM elevated above 103.5.Levels remained elevated in Dec 2023, confirming the diagnosis of APS -- Labs today were reviewed and require no intervention. Labs show WBC 8.7, Hgb 14.6, MCV 86.7, Plt 386 --RTC in 6 months with labs.   No orders of the defined types were placed in this encounter.  All questions were answered. The patient knows to call the clinic with any problems, questions or concerns.  I have spent a total of 25 minutes minutes of face-to-face and non-face-to-face time, preparing to see the patient, performing a medically appropriate examination, counseling and educating the patient,  documenting clinical information in the electronic health record,  and care coordination.   Rogerio Clay, MD Department of Hematology/Oncology [facility] Phone: [phone] Pager: [phone] Email: [email or web address]

## 2023-09-19 DIAGNOSIS — G4700 Insomnia, unspecified: Secondary | ICD-10-CM | POA: Diagnosis not present

## 2023-09-26 ENCOUNTER — Other Ambulatory Visit: Payer: Self-pay | Admitting: Neurology

## 2023-09-26 NOTE — Telephone Encounter (Signed)
 Last seen on 07/24/23 Follow up scheduled on 01/28/24

## 2023-10-07 DIAGNOSIS — M51361 Other intervertebral disc degeneration, lumbar region with lower extremity pain only: Secondary | ICD-10-CM | POA: Diagnosis not present

## 2023-10-07 DIAGNOSIS — M961 Postlaminectomy syndrome, not elsewhere classified: Secondary | ICD-10-CM | POA: Diagnosis not present

## 2023-10-07 DIAGNOSIS — Z79899 Other long term (current) drug therapy: Secondary | ICD-10-CM | POA: Diagnosis not present

## 2023-10-08 ENCOUNTER — Other Ambulatory Visit: Payer: Self-pay | Admitting: Family Medicine

## 2023-10-08 DIAGNOSIS — M961 Postlaminectomy syndrome, not elsewhere classified: Secondary | ICD-10-CM

## 2023-10-11 NOTE — Discharge Instructions (Signed)

## 2023-10-14 ENCOUNTER — Ambulatory Visit
Admission: RE | Admit: 2023-10-14 | Discharge: 2023-10-14 | Disposition: A | Discharge status: Home / Self Care | Source: Ambulatory Visit | Attending: Family Medicine | Admitting: Family Medicine

## 2023-10-14 ENCOUNTER — Ambulatory Visit
Admission: RE | Admit: 2023-10-14 | Discharge: 2023-10-14 | Disposition: A | Discharge status: Home / Self Care | Source: Ambulatory Visit | Attending: Family Medicine

## 2023-10-14 DIAGNOSIS — M79605 Pain in left leg: Secondary | ICD-10-CM | POA: Diagnosis not present

## 2023-10-14 DIAGNOSIS — M961 Postlaminectomy syndrome, not elsewhere classified: Secondary | ICD-10-CM

## 2023-10-14 DIAGNOSIS — Z981 Arthrodesis status: Secondary | ICD-10-CM | POA: Diagnosis not present

## 2023-10-14 DIAGNOSIS — M47817 Spondylosis without myelopathy or radiculopathy, lumbosacral region: Secondary | ICD-10-CM | POA: Diagnosis not present

## 2023-10-14 DIAGNOSIS — M5127 Other intervertebral disc displacement, lumbosacral region: Secondary | ICD-10-CM | POA: Diagnosis not present

## 2023-10-14 DIAGNOSIS — M51362 Other intervertebral disc degeneration, lumbar region with discogenic back pain and lower extremity pain: Secondary | ICD-10-CM | POA: Diagnosis not present

## 2023-10-14 DIAGNOSIS — M79604 Pain in right leg: Secondary | ICD-10-CM | POA: Diagnosis not present

## 2023-10-14 MED ORDER — ONDANSETRON HCL 4 MG/2ML IJ SOLN: 4.0000 mg | Freq: Once | INTRAMUSCULAR | Status: DC | PRN

## 2023-10-14 MED ORDER — DIAZEPAM 5 MG PO TABS: 5.0000 mg | ORAL_TABLET | Freq: Once | ORAL | Status: DC

## 2023-10-14 MED ORDER — MEPERIDINE HCL 50 MG/ML IJ SOLN: 50.0000 mg | Freq: Once | INTRAMUSCULAR | Status: DC | PRN

## 2023-10-14 MED ORDER — IOPAMIDOL (ISOVUE-M 200) INJECTION 41%
10.0000 mL | Freq: Once | INTRAMUSCULAR | Status: AC
  Administered 2023-10-14: 10 mL via INTRATHECAL

## 2023-10-24 ENCOUNTER — Other Ambulatory Visit: Payer: Self-pay | Admitting: Hematology and Oncology

## 2023-10-28 ENCOUNTER — Other Ambulatory Visit: Payer: Self-pay

## 2023-10-28 ENCOUNTER — Other Ambulatory Visit: Payer: Self-pay | Admitting: Neurology

## 2023-11-04 DIAGNOSIS — M545 Low back pain, unspecified: Secondary | ICD-10-CM | POA: Diagnosis not present

## 2023-11-07 DIAGNOSIS — J069 Acute upper respiratory infection, unspecified: Secondary | ICD-10-CM | POA: Diagnosis not present

## 2023-11-08 DIAGNOSIS — M5442 Lumbago with sciatica, left side: Secondary | ICD-10-CM | POA: Diagnosis not present

## 2023-11-08 DIAGNOSIS — M5441 Lumbago with sciatica, right side: Secondary | ICD-10-CM | POA: Diagnosis not present

## 2023-11-09 ENCOUNTER — Emergency Department (HOSPITAL_COMMUNITY)
Admission: EM | Admit: 2023-11-09 | Discharge: 2023-11-09 | Disposition: A | Discharge status: Home / Self Care | Attending: Emergency Medicine | Admitting: Emergency Medicine

## 2023-11-09 ENCOUNTER — Other Ambulatory Visit: Payer: Self-pay

## 2023-11-09 ENCOUNTER — Encounter (HOSPITAL_COMMUNITY): Payer: Self-pay | Admitting: *Deleted

## 2023-11-09 DIAGNOSIS — M79604 Pain in right leg: Secondary | ICD-10-CM | POA: Insufficient documentation

## 2023-11-09 DIAGNOSIS — M79605 Pain in left leg: Secondary | ICD-10-CM | POA: Diagnosis not present

## 2023-11-09 MED ORDER — KETOROLAC TROMETHAMINE 15 MG/ML IJ SOLN
15.0000 mg | Freq: Once | INTRAMUSCULAR | Status: AC
  Administered 2023-11-09: 15 mg via INTRAMUSCULAR
  Filled 2023-11-09: qty 1

## 2023-11-09 MED ORDER — OXYCODONE HCL 5 MG PO TABS
10.0000 mg | ORAL_TABLET | Freq: Once | ORAL | Status: AC
  Administered 2023-11-09: 10 mg via ORAL
  Filled 2023-11-09: qty 2

## 2023-11-09 MED ORDER — METHYLPREDNISOLONE 4 MG PO TBPK: ORAL_TABLET | ORAL | 0 refills | Status: DC

## 2023-11-09 MED ORDER — METHOCARBAMOL 500 MG PO TABS: 500.0000 mg | ORAL_TABLET | Freq: Two times a day (BID) | ORAL | 0 refills | Status: DC

## 2023-11-09 MED ORDER — DICLOFENAC SODIUM 1 % EX GEL
4.0000 g | Freq: Four times a day (QID) | CUTANEOUS | 0 refills | Status: DC
Start: 2023-11-09 — End: 2023-11-19

## 2023-11-09 MED ORDER — ACETAMINOPHEN 500 MG PO TABS
1000.0000 mg | ORAL_TABLET | Freq: Once | ORAL | Status: AC
  Administered 2023-11-09: 1000 mg via ORAL
  Filled 2023-11-09: qty 2

## 2023-11-09 MED ORDER — DICLOFENAC SODIUM 1 % EX GEL: 4.0000 g | Freq: Four times a day (QID) | CUTANEOUS | 0 refills | Status: DC

## 2023-11-09 MED ORDER — DIAZEPAM 5 MG PO TABS
5.0000 mg | ORAL_TABLET | Freq: Once | ORAL | Status: AC
  Administered 2023-11-09: 5 mg via ORAL
  Filled 2023-11-09: qty 1

## 2023-11-09 NOTE — ED Provider Notes (Signed)
 [place] EMERGENCY DEPARTMENT AT [facility] Provider Note   CSN: [id number] Arrival date & time: 11/09/23  9195     Patient presents with: Leg Pain   [name] is a 69 y.o. female.   68 yo F with a cc of bilateral leg pain.  This been going on for some time but worsening over the past month or so.  She sees a pain management doctor through the orthopedic clinic.  She has recently had a CT myelogram.  She told me she saw the clinic late yesterday and they told her to come to the emergency department.  She is not sure of why they wanted her to come here.  She denies any discussion of admission or further imaging.   Leg Pain      Prior to Admission medications   Medication Sig Start Date End Date Taking? Authorizing Provider  diclofenac  Sodium (VOLTAREN ) 1 % GEL Apply 4 g topically 4 (four) times daily. 11/09/23  Yes Emil Share, DO  methocarbamol  (ROBAXIN ) 500 MG tablet Take 1 tablet (500 mg total) by mouth 2 (two) times daily. 11/09/23  Yes Emil Share, DO  methylPREDNISolone  (MEDROL  DOSEPAK) 4 MG TBPK tablet Day 1: 8mg  before breakfast, 4 mg after lunch, 4 mg after supper, and 8 mg at bedtime Day 2: 4 mg before breakfast, 4 mg after lunch, 4 mg  after supper, and 8 mg  at bedtime Day 3:  4 mg  before breakfast, 4 mg  after lunch, 4 mg after supper, and 4 mg  at bedtime Day 4: 4 mg  before breakfast, 4 mg  after lunch, and 4 mg at bedtime Day 5: 4 mg  before breakfast and 4 mg at bedtime Day 6: 4 mg  before breakfast 11/09/23  Yes Ara Mano, DO  benztropine  (COGENTIN ) 1 MG tablet TAKE 1 TABLET BY MOUTH TWICE A DAY 03/19/23   Hurst, Verneita T, PA-C  clonazePAM  (KLONOPIN ) 0.5 MG tablet Take 1 tablet (0.5 mg total) by mouth 2 (two) times daily. 07/24/23   Sater, Charlie DELENA, MD  divalproex  (DEPAKOTE ) 250 MG DR tablet TAKE 1 TABLET (250 MG TOTAL) BY MOUTH IN THE MORNING, AT NOON, AND AT BEDTIME. 01/27/23   Hurst, Verneita T, PA-C  donepezil  (ARICEPT ) 5 MG tablet Take 5 mg by mouth  daily. Patient not taking: Reported on 07/24/2023 03/01/22   [provider]  enoxaparin  (LOVENOX ) 60 MG/0.6ML injection INJECT 0.5 MLS (50 MG TOTAL) INTO THE SKIN 2 (TWO) TIMES DAILY 10/28/23 02/25/24  Dorsey, John T IV, MD  fluvoxaMINE  (LUVOX ) 50 MG tablet TAKE 1 TABLET BY MOUTH EVERYDAY AT BEDTIME 01/05/23   Hurst, Verneita T, PA-C  midodrine  (PROAMATINE ) 2.5 MG tablet Take 1 tablet (2.5 mg total) by mouth 3 (three) times daily with meals. 02/23/22   Leotis Bogus, MD  omeprazole  (PRILOSEC) 40 MG capsule Take 40 mg by mouth daily as needed (acid reflux).    [provider]  QUEtiapine  (SEROQUEL ) 100 MG tablet TAKE 1 TABLET BY MOUTH EVERYDAY AT BEDTIME 09/26/23   Sater, Charlie DELENA, MD    Allergies: Penicillins    Review of Systems  Updated Vital Signs BP (!) 140/88 (BP Location: Right Arm)   Pulse (!) 120   Temp 98.2 F (36.8 C) (Oral)   Resp 18   Ht 5' 7 (1.702 m)   Wt 52.2 kg   SpO2 98%   BMI 18.01 kg/m   Physical Exam Vitals and nursing note reviewed.  Constitutional:  General: She is not in acute distress.    Appearance: She is well-developed. She is not diaphoretic.  HENT:     Head: Normocephalic and atraumatic.  Eyes:     Pupils: Pupils are equal, round, and reactive to light.  Cardiovascular:     Rate and Rhythm: Normal rate and regular rhythm.     Heart sounds: No murmur heard.    No friction rub. No gallop.  Pulmonary:     Effort: Pulmonary effort is normal.     Breath sounds: No wheezing or rales.  Abdominal:     General: There is no distension.     Palpations: Abdomen is soft.     Tenderness: There is no abdominal tenderness.  Musculoskeletal:        General: No tenderness.     Cervical back: Normal range of motion and neck supple.     Comments: Pulse motor and sensation intact bilateral lower extremities.  Reflexes are 2+ and equal.  No clonus.  Negative straight leg raise test bilaterally.  No obvious midline spinal tenderness step-offs  or deformities.  Skin:    General: Skin is warm and dry.  Neurological:     Mental Status: She is alert and oriented to person, place, and time.  Psychiatric:        Behavior: Behavior normal.     (all labs ordered are listed, but only abnormal results are displayed) Labs Reviewed - No data to display  EKG: None  Radiology: No results found.   Procedures   Medications Ordered in the ED  acetaminophen  (TYLENOL ) tablet 1,000 mg (has no administration in time range)  ketorolac  (TORADOL ) 15 MG/ML injection 15 mg (has no administration in time range)  oxyCODONE  (Oxy IR/ROXICODONE ) immediate release tablet 10 mg (has no administration in time range)  diazepam  (VALIUM ) tablet 5 mg (has no administration in time range)                                    Medical Decision Making Risk OTC drugs. Prescription drug management.   68 yo F with a chief complaints of bilateral leg pain.  On my record review the patient has been seen for bilateral leg pain since at least 2021.  She has seen multiple doctors for this.  Had multiple CT images.  I inquired about MRI and she tells me that she has a pacemaker that is not compatible.  She has had a recent CT myelogram that was thought to be unremarkable.  Other than her age and bilateral radicular symptoms she has no red flags.  I offered to have the patient evaluated by social work to try and be placed in a skilled nursing facility.  I do not think I have an indication for admission.  After shared decision-making at bedside patient electing for some attempted pain control here with change in her meds for home.  Will have her contact her pain management doctor on Monday.  Of note the patient was documented as tachycardic on arrival, manual heart rate in the 90s on my exam.  8:49 AM:  I have discussed the diagnosis/risks/treatment options with the patient.  Evaluation and diagnostic testing in the emergency department does not suggest an  emergent condition requiring admission or immediate intervention beyond what has been performed at this time.  They will follow up with PCP, Pain managment. We also discussed returning to the ED immediately if  new or worsening sx occur. We discussed the sx which are most concerning (e.g., sudden worsening pain, fever, inability to tolerate by mouth, cauda equina s/sx) that necessitate immediate return. Medications administered to the patient during their visit and any new prescriptions provided to the patient are listed below.  Medications given during this visit Medications  acetaminophen  (TYLENOL ) tablet 1,000 mg (has no administration in time range)  ketorolac  (TORADOL ) 15 MG/ML injection 15 mg (has no administration in time range)  oxyCODONE  (Oxy IR/ROXICODONE ) immediate release tablet 10 mg (has no administration in time range)  diazepam  (VALIUM ) tablet 5 mg (has no administration in time range)     The patient appears reasonably screen and/or stabilized for discharge and I doubt any other medical condition or other [facility] requiring further screening, evaluation, or treatment in the ED at this time prior to discharge.       Final diagnoses:  Bilateral leg pain    ED Discharge Orders          Ordered    diclofenac  Sodium (VOLTAREN ) 1 % GEL  4 times daily        11/09/23 0845    methylPREDNISolone  (MEDROL  DOSEPAK) 4 MG TBPK tablet        11/09/23 0845    methocarbamol  (ROBAXIN ) 500 MG tablet  2 times daily        11/09/23 0845               Emil Share, DO 11/09/23 [phone]

## 2023-11-09 NOTE — Discharge Instructions (Addendum)
 Take the steroids as prescribed.  Try the gel.   Also take tylenol  1000mg (2 extra strength) four times a day.   Then take the pain medicine if you feel like you need it. Narcotics do not help with the pain, they only make you care about it less.  You can become addicted to this, people may break into your house to steal it.  It will constipate you.  If you drive under the influence of this medicine you can get a DUI.

## 2023-11-09 NOTE — ED Triage Notes (Signed)
 Bil leg pain, seen at [facility] and sent for eval. Pt states pain is in both legs and getting worse. She does not require assistance with walking at present time. Pain is 10/10 all the time.

## 2023-11-11 ENCOUNTER — Emergency Department (HOSPITAL_COMMUNITY)
Admission: EM | Admit: 2023-11-11 | Discharge: 2023-11-11 | Disposition: A | Discharge status: Home / Self Care | Source: Ambulatory Visit | Attending: Emergency Medicine | Admitting: Emergency Medicine

## 2023-11-11 ENCOUNTER — Telehealth: Payer: Self-pay | Admitting: Neurology

## 2023-11-11 ENCOUNTER — Other Ambulatory Visit: Payer: Self-pay

## 2023-11-11 ENCOUNTER — Encounter (HOSPITAL_COMMUNITY): Payer: Self-pay | Admitting: Emergency Medicine

## 2023-11-11 DIAGNOSIS — Z8541 Personal history of malignant neoplasm of cervix uteri: Secondary | ICD-10-CM | POA: Insufficient documentation

## 2023-11-11 DIAGNOSIS — M79604 Pain in right leg: Secondary | ICD-10-CM | POA: Insufficient documentation

## 2023-11-11 DIAGNOSIS — M79605 Pain in left leg: Secondary | ICD-10-CM | POA: Insufficient documentation

## 2023-11-11 DIAGNOSIS — Z950 Presence of cardiac pacemaker: Secondary | ICD-10-CM | POA: Diagnosis not present

## 2023-11-11 MED ORDER — MORPHINE SULFATE (PF) 4 MG/ML IV SOLN
4.0000 mg | Freq: Once | INTRAVENOUS | Status: AC
  Administered 2023-11-11: 4 mg via INTRAMUSCULAR
  Filled 2023-11-11: qty 1

## 2023-11-11 NOTE — Telephone Encounter (Signed)
 Phone room: please call back and offer 11/19/23 at 1pm with Dr. Vear. I placed on hold

## 2023-11-11 NOTE — ED Notes (Signed)
 Pt completely assessed by EDP and set to discharge. No RN assessment performed.  Pt provided discharge instructions and prescription information. Pt was given the opportunity to ask questions and questions were answered.

## 2023-11-11 NOTE — Telephone Encounter (Signed)
 Pt daughter called because PT was just in ER , Pt daughter stated that Hospital recommended Pt to follow up with ealiest appt .  Pt daughter is calling for Hospital Follow up

## 2023-11-11 NOTE — ED Notes (Signed)
 Pt's pain improved from a 10 down to an 8. Gait assessed and noted pt to have an even and steady gait.

## 2023-11-11 NOTE — ED Triage Notes (Signed)
 Pt here for leg pain.  Sent by pain doctor. Seen over weekend for the same. 10/10.

## 2023-11-11 NOTE — Telephone Encounter (Signed)
 Called PT, Hospital follow up Scheduled

## 2023-11-11 NOTE — ED Notes (Addendum)
 Pt reports bilateral leg pain for years. She sees a pain doctor to manage the pain, but she does not no a diagnosis to the cause. Pt had an exacerbation 2 days ago. Denies injury, but does report she has been having to walk farther to the dumpster at her residence. Pain is described as an ache from the top of her thighs to her feet. Denies any swelling, decreased sensation, skin changes. Pt reports she was seen at [facility] ED a week ago and was given steroids and a muscle relaxer for minimal relief. She also takes oxycodone  10 mg three times a day from her pain clinic.

## 2023-11-11 NOTE — Discharge Instructions (Addendum)
 You were evaluated in the emergency room for leg pain.  Please follow-up with your orthopedic doctor as scheduled tomorrow.  If you experience any new or worsening symptoms including loss of bowel or bladder control please return emergency room.
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
## 2023-11-18 NOTE — Progress Notes (Unsigned)
 [facility]  PATIENT: [name] DOB: January 15, 1956  REFERRING DOCTOR OR PCP: Dr. Elfrieda Haggard SOURCE: Patient, notes from Dr. Burnetta, imaging reports and lumbar MRI and CT images personally reviewed.   _________________________________   HISTORICAL  CHIEF COMPLAINT:  No chief complaint on file.   HISTORY OF PRESENT ILLNESS:  [name] is a 68 y.o. woman with reduced memory and poor gait.  11/18/2023 She has a tremor in her hands and lips. This also affects her handwriting.    She feels anxiety is worse and sometimes has panic attacks.   In the past tremor was a little better with benzodiazepines.   She no longer has lorazepam .     She reports insomnia, worse since lorazepam  ran out..    She has an antiphospolipid antibody.     She is on Lovenox  for the dural venous thrombosis.   She was briefly on coumadin  late summer 2023 but it was stopped as nontherapeutic.  Genrrally for APL, coumadin  is recommended over a DOAC.    She had issues with low and high INRs and coumadin  was switched over to lovenox .     She saw Rheum and she was felt not to have SLE.     She has mild cognitive impairment stable.  She feels if anything, hr memory is better now compared to earlier this year.  No recent spell of confusion or hallucination as she had earlier in 2024.  Both memory and confusion are better than last visit.     She has orthostatic hypotension and falls last summer.  She was started on midodrine  for the orthostatic hypotension.  This has helped and no longer has spells of LH.  Echocardiogram was normal.   History of thrombosis/APL syndrome In August 2023, she had mental status changes and seizures.  The CT head showed Filling defect within the left sigmoid sinus and jugular vein is suspicious for nonocclusive thrombus. Consider confirmation with dedicated CT venogram of the head.    CT venogram confirmed filling defects in left transverse and sigmoid sinus and  internal jugular vein.  She also had blood clots in her legs.  This led to a hospitalization in [place]  and she was placed on Lovenox .  For the seizure she was placed on Depakote .  Some medications were changed.  Due to memory issues, Xanax  was discontinued.  Antiphospholipid syndrome was assumed.  Initially she was placed on Lovenox  and for a while was on a combination Lovenox  and Coumadin  and then Coumadin .  However, he had difficulty with both supratherapeutic and subtherapeutic levels on Coumadin  so was placed back on the Lovenox  and continues on that.    I had seen her a couple times in 2021 for fibromyalgia type symptoms, gait disturbance and memory loss.  Labs in 2021 showed low B12 (146 with 180-914 normal).  CK was elevated at 836 (<234 is normal) while hospitalized but was normal when we checked at the last visit.    TSH, ammonia, lactic acid was normal.        07/24/2023    1:20 PM  MMSE - Mini Mental State Exam  Orientation to time 5  Orientation to Place 5  Registration 3  Attention/ Calculation 4  Recall 2  Language- name 2 objects 1  Language- repeat 1  Language- follow 3 step command 3  Language- read & follow direction 1  Write a sentence 1  Copy design 0  Total score 26  02/28/2022    5:01 PM 02/28/2022    4:06 PM 01/04/2020   11:08 AM 07/02/2019    9:26 AM  Montreal Cognitive Assessment   Visuospatial/ Executive (0/5) 2 2 2 2   Naming (0/3) 3 3 3 2   Attention: Read list of digits (0/2) 2 2 2 2   Attention: Read list of letters (0/1) 1 1 1 1   Attention: Serial 7 subtraction starting at 100 (0/3) 2 2 1 2   Language: Repeat phrase (0/2) 1 1 2 2   Language : Fluency (0/1) 0 0 0 0  Abstraction (0/2) 2 2 2 2   Delayed Recall (0/5) 4 4 3 2   Orientation (0/6) 5 5 5 5   Total 22 22 21 20   Adjusted Score (based on education) 23  22 21      Impression: This NCV/EMG study shows the following: 1. Most of the proximal muscles showed small polyphasic motor units  that recruited normally. This could be consistent with a mild myopathy. 2. Although the peroneal motor responses had reduced amplitudes, muscles innervated by that nerve did not show a neuropathic pattern. Other motor and sensory nerves were essentially normal and this is likely to be an incidental finding rather than an indicator of neuropathy 3.   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  Brain parenchyma appeared normal.  There were no acute findings.  CT scan lumbar spine 01/06/2019 showed prior fusion and posterior decompression at the L4-5 level with incorporation within the intervertebral disc spacer, however minimal incorporation across the remaining endplates at this level. Hardware is intact without evidence of loosening.   Probable mild canal stenosis at the L3-4 level.  L4-5 far lateral disc material appears to contact the exited left L4 nerve root. . Central disc protrusion at L5-S1 without evidence of canal stenosis.  MRI of the lumbar spine 09/11/2005 showed mild spinal stenosis at L4-L5 due to disc bulging, facet hypertrophy and ligamenta flava hypertrophy.  There was lateral recess stenosis.  L5-S1 showed facet hypertrophy but no spinal stenosis or foraminal narrowing.  Other levels appeared normal.  Other labs:  The AB42/40 ratio and pTu181 were normal.   NFL slightly increased at 4.99 (<4.6).    REVIEW OF SYSTEMS: Constitutional: No fevers, chills, sweats, or change in appetite Eyes: No visual changes, double vision, eye pain Ear, nose and throat: No hearing loss, ear pain, nasal congestion, sore throat Cardiovascular: No chest pain, palpitations Respiratory:  No shortness of breath at rest or with exertion.   No wheezes GastrointestinaI: No nausea, vomiting, diarrhea,  abdominal pain, fecal incontinence Genitourinary:  No dysuria, urinary retention or frequency.  No nocturia. Musculoskeletal: As above  integumentary: No rash, pruritus, skin lesions Neurological: as above Psychiatric: No depression at this time.  No anxiety Endocrine: No palpitations, diaphoresis, change in appetite, change in weigh or increased thirst Hematologic/Lymphatic:  No anemia, purpura, petechiae. Allergic/Immunologic: No itchy/runny eyes, nasal congestion, recent allergic reactions, rashes  ALLERGIES: Allergies  Allergen Reactions   Penicillins Other (See Comments)    Yeast infection Did it involve swelling of the face/tongue/throat, SOB, or low BP? No Did it involve sudden or severe rash/hives, skin peeling, or any reaction on the inside of your mouth or nose? No Did you need to seek medical attention at a hospital or doctor's office? Yes When  did it last happen? More than 5 years ago  If all above answers are NO, may proceed with cephalosporin use.     HOME MEDICATIONS:  Current Outpatient Medications:    benztropine  (COGENTIN ) 1 MG tablet, TAKE 1 TABLET BY MOUTH TWICE A DAY, Disp: 60 tablet, Rfl: 0   clonazePAM  (KLONOPIN ) 0.5 MG tablet, Take 1 tablet (0.5 mg total) by mouth 2 (two) times daily., Disp: 60 tablet, Rfl: 3   diclofenac  Sodium (VOLTAREN ) 1 % GEL, Apply 4 g topically 4 (four) times daily., Disp: 100 g, Rfl: 0   divalproex  (DEPAKOTE ) 250 MG DR tablet, TAKE 1 TABLET (250 MG TOTAL) BY MOUTH IN THE MORNING, AT NOON, AND AT BEDTIME., Disp: 270 tablet, Rfl: 0   donepezil  (ARICEPT ) 5 MG tablet, Take 5 mg by mouth daily. (Patient not taking: Reported on 07/24/2023), Disp: , Rfl:    enoxaparin  (LOVENOX ) 60 MG/0.6ML injection, INJECT 0.5 MLS (50 MG TOTAL) INTO THE SKIN 2 (TWO) TIMES DAILY, Disp: 120 mL, Rfl: 0   fluvoxaMINE  (LUVOX ) 50 MG tablet, TAKE 1 TABLET BY MOUTH EVERYDAY AT BEDTIME, Disp: 90 tablet, Rfl: 0   methocarbamol  (ROBAXIN ) 500 MG tablet, Take 1 tablet  (500 mg total) by mouth 2 (two) times daily., Disp: 10 tablet, Rfl: 0   methylPREDNISolone  (MEDROL  DOSEPAK) 4 MG TBPK tablet, Day 1: 8mg  before breakfast, 4 mg after lunch, 4 mg after supper, and 8 mg at bedtime Day 2: 4 mg before breakfast, 4 mg after lunch, 4 mg  after supper, and 8 mg  at bedtime Day 3:  4 mg  before breakfast, 4 mg  after lunch, 4 mg after supper, and 4 mg  at bedtime Day 4: 4 mg  before breakfast, 4 mg  after lunch, and 4 mg at bedtime Day 5: 4 mg  before breakfast and 4 mg at bedtime Day 6: 4 mg  before breakfast, Disp: 1 each, Rfl: 0   midodrine  (PROAMATINE ) 2.5 MG tablet, Take 1 tablet (2.5 mg total) by mouth 3 (three) times daily with meals., Disp: 90 tablet, Rfl: 0   omeprazole  (PRILOSEC) 40 MG capsule, Take 40 mg by mouth daily as needed (acid reflux)., Disp: , Rfl:    QUEtiapine  (SEROQUEL ) 100 MG tablet, TAKE 1 TABLET BY MOUTH EVERYDAY AT BEDTIME, Disp: 90 tablet, Rfl: 0  PAST MEDICAL HISTORY: Past Medical History:  Diagnosis Date   Anxiety    anxiety and mild depressive order systoms   Bipolar disorder (HCC)     (02/20/2013)   Cervical cancer (HCC) 03/19/1980   Cholelithiasis 11/11/2021   Depression    Dysrhythmia    Headache(784.0)    monthly (02/20/2013)   Hyperlipidemia 11/11/2021   Pacemaker    medtronic   PONV (postoperative nausea and vomiting)    Sinus pause 02/14/2013   Syncope and collapse    echo-April 12,2012-EF 55% Echo normal; recorder with prolonged sinus pauses --> status post   Tobacco abuse    Vertigo     PAST SURGICAL HISTORY: Past Surgical History:  Procedure Laterality Date   ABDOMINAL HYSTERECTOMY  03/19/1980   partial   BACK SURGERY     CERVICAL FUSION Left 11/2013   C4-C6   CERVICAL FUSION     3,4,5,   INSERT / REPLACE / REMOVE PACEMAKER  02/20/2013   medtronic   LOOP RECORDER IMPLANT N/A 11/06/2012   Procedure: LINQ LOOP RECORDER IMPLANT;  Surgeon: Jerel Balding, MD;  Location: [facility];  Service: Cardiovascular;   Laterality: N/A;  NECK SURGERY  11/17/2013   PACEMAKER PLACEMENT N/A 02/16/2013   PERMANENT PACEMAKER INSERTION N/A 02/20/2013   Procedure: PERMANENT PACEMAKER INSERTION;  Surgeon: Jerel Balding, MD;  Location: [facility];  Service: Cardiovascular;  Laterality: N/A;   POSTERIOR LUMBAR FUSION  ~ 2009   ROBOTIC ASSISTED SALPINGO OOPHERECTOMY Bilateral 11/25/2018   Procedure: XI ROBOTIC ASSISTED BILATERAL SALPINGO OOPHORECTOMY and LYSIS OF ADHESIONS.;  Surgeon: Eloy Herring, MD;  Location: [facility];  Service: Gynecology;  Laterality: Bilateral;   SPINAL FUSION  03/19/2014   TONSILLECTOMY     TRANSTHORACIC ECHOCARDIOGRAM  07/11/2010   The left atrial size is normal.There is no evidence of mitral vavle prolaspe.Right ventriicular systolic pressure is normal . Injection of contrast documented no interatrial shunt. Essentially normal 2D echo -doppler study     FAMILY HISTORY: Family History  Adopted: Yes  Problem Relation Age of Onset   Heart attack Brother     SOCIAL HISTORY:  Social History   Socioeconomic History   Marital status: Widowed    Spouse name: Not on file   Number of children: 3   Years of education: 55   Highest education level: Not on file  Occupational History   Not on file  Tobacco Use   Smoking status: Every Day    Current packs/day: 1.00    Average packs/day: 0.5 packs/day for 40.3 years (20.3 ttl pk-yrs)    Types: Cigarettes    Start date: 07/2023   Smokeless tobacco: Never  Vaping Use   Vaping status: Never Used  Substance and Sexual Activity   Alcohol  use: No   Drug use: No   Sexual activity: Yes  Other Topics Concern   Not on file  Social History Narrative   Married 29 years . Has 3 children. Current smoker -1 pack a day-for 36 years .Alcohol  :  1 beer on occasion.   She is soon to be a grandmother.  His before to going on a trip up to the [place]  area to be closer to family when the baby is born.  She is extremely excited.   She very much  would like to move back up to that area to be closer to family, but the whether is it just too cold in the winter.   Right handed    Soda daily   Lives with daughter    Social Drivers of Health   Financial Resource Strain: Low Risk  (10/11/2020)   Overall Financial Resource Strain (CARDIA)    Difficulty of Paying Living Expenses: Not hard at all  Food Insecurity: No Food Insecurity (11/06/2022)   Hunger Vital Sign    Worried About Running Out of Food in the Last Year: Never true    Ran Out of Food in the Last Year: Never true  Transportation Needs: No Transportation Needs (11/06/2022)   PRAPARE - Administrator, Civil Service (Medical): No    Lack of Transportation (Non-Medical): No  Physical Activity: Not on file  Stress: No Stress Concern Present (10/11/2020)   [employer] of Occupational Health - Occupational Stress Questionnaire    Feeling of Stress : Only a little  Social Connections: Not on file  Intimate Partner Violence: Not At Risk (11/22/2021)   Humiliation, Afraid, Rape, and Kick questionnaire    Fear of Current or Ex-Partner: No    Emotionally Abused: No    Physically Abused: No    Sexually Abused: No     PHYSICAL EXAM  There were no  vitals filed for this visit.   There is no height or weight on file to calculate BMI.   General: The patient is well-developed and well-nourished and in no acute distress   Skin: Extremities are without rash or  edema.   Neurologic Exam  Mental status: Alert and oriented.  Focus/attention seems reduced in conversation Score MMSE 26/30 (see above).   Cranial nerves: Extraocular movements are full. There is good facial sensation to soft touch bilaterally.Facial strength is normal. No obvious hearing deficits are noted.  Motor:  She has a low amplitude fast tremor in L>R hand and in lips, similar to last visit.   Muscle bulk is normal.   Tone is normal.  No cogwheeling.  Strength is 5 / 5 in all 4 extremities except  4+/5 right EHL.     Sensory: She has mild reduced vibration in the feet relative to the knees.  Sensation was normal in the arms.  Coordination: Cerebellar testing reveals good finger-nose-finger and heel-to-shin bilaterally.  Gait and station: Station is normal.  Gait is mildly wide.   Turn is mildly off balance. Tandem is wide.  She does not have retropulsion.  Romberg is negative.   Reflexes: Deep tendon reflexes are symmetric and normal bilaterally.       DIAGNOSTIC DATA (LABS, IMAGING, TESTING) - I reviewed patient records, labs, notes, testing and imaging myself where available.  Lab Results  Component Value Date   WBC 8.7 08/29/2023   HGB 14.6 08/29/2023   HCT 43.8 08/29/2023   MCV 86.7 08/29/2023   PLT 386 08/29/2023      Component Value Date/Time   NA 139 08/29/2023 1341   NA 139 01/04/2020 1147   K 4.1 08/29/2023 1341   CL 103 08/29/2023 1341   CO2 25 08/29/2023 1341   GLUCOSE 109 (H) 08/29/2023 1341   BUN 12 08/29/2023 1341   BUN 11 01/04/2020 1147   CREATININE 1.01 (H) 08/29/2023 1341   CREATININE 1.08 (H) 09/24/2018 1019   CALCIUM  9.4 08/29/2023 1341   PROT 7.0 08/29/2023 1341   PROT 6.6 01/04/2020 1147   ALBUMIN 4.2 08/29/2023 1341   ALBUMIN 3.4 (L) 02/25/2020 0910   AST 14 (L) 08/29/2023 1341   ALT 8 08/29/2023 1341   ALKPHOS 78 08/29/2023 1341   BILITOT 1.0 08/29/2023 1341   GFRNONAA >60 08/29/2023 1341   GFRNONAA 56 (L) 09/24/2018 1019   GFRAA 69 01/04/2020 1147   GFRAA 65 09/24/2018 1019   Lab Results  Component Value Date   CHOL 256 (H) 09/24/2018   HDL 50 09/24/2018   LDLCALC 179 (H) 09/24/2018   TRIG 130 09/24/2018   CHOLHDL 5.1 (H) 09/24/2018       ASSESSMENT AND PLAN  No diagnosis found.  1.  She has APL syndrome .  She is currently on Lovenox  for dural sinus venous thrombosis.      2.  MCI could be due to APL.  Labs not c/w Alzheimer and LBD unlikely 3.  She has tremor exacerbated by anxiety.  Will add clonazepam  0.5 mg po  bid    4.  She will return to see us  in 6 months or sooner if there are new or worsening neurologic symptoms.    This visit is part of a comprehensive longitudinal care medical relationship regarding the patients primary diagnosis of antiphospholipid syndrome and related concerns.   Marieme Mcmackin A. Vear, MD, [facility] 11/18/2023, 12:55 PM Certified in Neurology, Clinical Neurophysiology, Sleep Medicine and Neuroimaging  [facility] [address] [place], [place] [address] [phone]

## 2023-11-19 ENCOUNTER — Encounter: Payer: Self-pay | Admitting: Neurology

## 2023-11-19 ENCOUNTER — Ambulatory Visit (INDEPENDENT_AMBULATORY_CARE_PROVIDER_SITE_OTHER): Admitting: Neurology

## 2023-11-19 VITALS — BP 140/86 | HR 73 | Ht 67.0 in | Wt 112.0 lb

## 2023-11-19 DIAGNOSIS — R413 Other amnesia: Secondary | ICD-10-CM

## 2023-11-19 DIAGNOSIS — M79604 Pain in right leg: Secondary | ICD-10-CM | POA: Diagnosis not present

## 2023-11-19 DIAGNOSIS — R251 Tremor, unspecified: Secondary | ICD-10-CM

## 2023-11-19 DIAGNOSIS — G4700 Insomnia, unspecified: Secondary | ICD-10-CM | POA: Diagnosis not present

## 2023-11-19 DIAGNOSIS — M79605 Pain in left leg: Secondary | ICD-10-CM | POA: Diagnosis not present

## 2023-11-19 DIAGNOSIS — R269 Unspecified abnormalities of gait and mobility: Secondary | ICD-10-CM

## 2023-11-19 DIAGNOSIS — D6861 Antiphospholipid syndrome: Secondary | ICD-10-CM | POA: Diagnosis not present

## 2023-11-19 MED ORDER — LAMOTRIGINE 25 MG PO TABS: ORAL_TABLET | ORAL | 5 refills | Status: AC

## 2023-11-19 NOTE — Patient Instructions (Signed)
The pharmacy has the prescription of lamotrigine 25 mg. Please take it as follows:  For 1 week take 1 pill once a day. The second week, take 1 pill twice a day. The third week, take 1 pill in the morning and 2 at bedtime or evening. The fourth week take 2 pills twice a day.  Most people tolerate lamotrigine very well.  Some will get a rash.  If you do get a significant rash stop the medicine immediately and let us know.

## 2023-11-20 ENCOUNTER — Telehealth: Payer: Self-pay | Admitting: Neurology

## 2023-11-20 MED ORDER — NORTRIPTYLINE HCL 25 MG PO CAPS: 25.0000 mg | ORAL_CAPSULE | Freq: Every day | ORAL | 3 refills | Status: DC

## 2023-11-20 NOTE — Telephone Encounter (Signed)
 Called and spoke with patient daughter Mardy and confirmed pt is not longer taking Fluvoxamine  50 mg tablet.   Rx sent.

## 2023-11-20 NOTE — Telephone Encounter (Signed)
 I called pt and updated daughter on plan. She verbalized understanding. I went to call in rx nortriptyline  but this alerted below. Sent to MD to review first.

## 2023-11-20 NOTE — Telephone Encounter (Signed)
 Patient's daughter, Mardy (on last DPR) asking to reason using lamoTRIgine  (LAMICTAL ) 25 MG tablet for pain management. Would like a call back

## 2023-11-20 NOTE — Telephone Encounter (Signed)
 Per Dr. Vear: she told me not on fluvoxamine  --- plesas find out if she is still taking it --- if so we cannot combine but if not on it we can send nortriptyline  in as desvenlafaxine dose is low so interaction not prominent

## 2023-11-20 NOTE — Telephone Encounter (Addendum)
 Reviewed OV note from 11/19/23/Dr. Vear. He states:  For her leg pain will add lamotrigine  --- if no better, consider tramadol or a tricyclic.  I called daughter. Daughter reports she personally has allergy to lamotrigine  and prefers her mother not take this afraid that her mother may also. She reports she also read med does not have a lot of efficacy. Would like to try her mother on a tricyclic instead (she read Dr. Duncan note). Aware I will send to MD to review and call back with his new recommendation. She verbalized understanding.

## 2023-11-21 ENCOUNTER — Other Ambulatory Visit (HOSPITAL_COMMUNITY): Payer: Self-pay | Admitting: Family Medicine

## 2023-11-21 DIAGNOSIS — M79604 Pain in right leg: Secondary | ICD-10-CM

## 2023-12-02 ENCOUNTER — Ambulatory Visit (HOSPITAL_COMMUNITY)
Admission: RE | Admit: 2023-12-02 | Discharge: 2023-12-02 | Disposition: A | Discharge status: Home / Self Care | Source: Ambulatory Visit | Attending: Family Medicine | Admitting: Family Medicine

## 2023-12-02 ENCOUNTER — Other Ambulatory Visit (HOSPITAL_COMMUNITY): Payer: Self-pay | Admitting: Family Medicine

## 2023-12-02 DIAGNOSIS — I739 Peripheral vascular disease, unspecified: Secondary | ICD-10-CM

## 2023-12-02 DIAGNOSIS — M79604 Pain in right leg: Secondary | ICD-10-CM

## 2023-12-03 ENCOUNTER — Encounter (HOSPITAL_COMMUNITY): Payer: Self-pay

## 2023-12-03 ENCOUNTER — Emergency Department (HOSPITAL_COMMUNITY)
Admission: EM | Admit: 2023-12-03 | Discharge: 2023-12-03 | Disposition: A | Discharge status: Home / Self Care | Source: Ambulatory Visit | Attending: Emergency Medicine | Admitting: Emergency Medicine

## 2023-12-03 DIAGNOSIS — M79604 Pain in right leg: Secondary | ICD-10-CM | POA: Diagnosis not present

## 2023-12-03 DIAGNOSIS — M5416 Radiculopathy, lumbar region: Secondary | ICD-10-CM | POA: Diagnosis not present

## 2023-12-03 DIAGNOSIS — R001 Bradycardia, unspecified: Secondary | ICD-10-CM | POA: Insufficient documentation

## 2023-12-03 DIAGNOSIS — R0989 Other specified symptoms and signs involving the circulatory and respiratory systems: Secondary | ICD-10-CM | POA: Diagnosis not present

## 2023-12-03 LAB — BASIC METABOLIC PANEL WITH GFR
Anion gap: 11 (ref 5–15)
BUN: 9 mg/dL (ref 8–23)
CO2: 23 mmol/L (ref 22–32)
Calcium: 9.2 mg/dL (ref 8.9–10.3)
Chloride: 109 mmol/L (ref 98–111)
Creatinine, Ser: 0.81 mg/dL (ref 0.44–1.00)
GFR, Estimated: >60 mL/min (ref >60)
Glucose, Bld: 93 mg/dL (ref 70–99)
Potassium: 3.7 mmol/L (ref 3.5–5.1)
Sodium: 143 mmol/L (ref 135–145)

## 2023-12-03 LAB — CBC
HCT: 41.8 % (ref 36.0–46.0)
Hemoglobin: 13 g/dL (ref 12.0–15.0)
MCH: 27.3 pg (ref 26.0–34.0)
MCHC: 31.1 g/dL (ref 30.0–36.0)
MCV: 87.8 fL (ref 80.0–100.0)
Platelets: 266 K/uL (ref 150–400)
RBC: 4.76 MIL/uL (ref 3.87–5.11)
RDW: 14.8 % (ref 11.5–15.5)
WBC: 9.2 K/uL (ref 4.0–10.5)
nRBC: 0 % (ref 0.0–0.2)

## 2023-12-03 NOTE — ED Triage Notes (Signed)
 Pt has known clots in her R leg, PE and brain. Pt saw pain management doctor today who was unable to find pulse in her R foot. Pulses doppler and marked in bilateral feet, color warm and pink

## 2023-12-03 NOTE — ED Provider Notes (Signed)
  EMERGENCY DEPARTMENT AT [facility] Provider Note   CSN: [id number] Arrival date & time: 12/03/23  2033     Patient presents with: Leg Pain   [name] is a 68 y.o. female.  Leg Pain Patient is a 69 year old female was in the ED today for concerns for possible ischemic limb to right lower leg after noting to have seen her hematologist and saw in his note that he had noted that she had no palpable pulse in right foot.  Was concerned and came to the ED to be evaluated after calling the triage line and was told come to the ED for evaluation if she reportedly did not have a pulse in her right foot.  Currently taking Lovenox  secondary to dural venous sinus thrombosis, DVT and PEs.  Reports that she has had no missed doses and has been very faithful in taking this medication since the diagnosis.  Seeing multiple specialists including neurology, hematology, orthopedics and her PCP.  Scheduled to follow-up with hematology as well as for exercise test in 2 days.  Denies decreased warmth, decrease sensation, numbness, weakness, tingling, cyanosis, pain.  Otherwise is currently asymptomatic and at her baseline.     Prior to Admission medications   Medication Sig Start Date End Date Taking? Authorizing Provider  clonazePAM  (KLONOPIN ) 0.5 MG tablet Take 1 tablet (0.5 mg total) by mouth 2 (two) times daily. 07/24/23   Sater, Charlie DELENA, MD  desvenlafaxine (PRISTIQ) 25 MG 24 hr tablet Take 25 mg by mouth daily. 09/19/23   [provider]  donepezil  (ARICEPT ) 5 MG tablet Take 5 mg by mouth daily. 03/01/22   [provider]  enoxaparin  (LOVENOX ) 60 MG/0.6ML injection INJECT 0.5 MLS (50 MG TOTAL) INTO THE SKIN 2 (TWO) TIMES DAILY 10/28/23 02/25/24  Dorsey, John T IV, MD  lamoTRIgine  (LAMICTAL ) 25 MG tablet One po every day x 5 days, then one po bid x 5 days, then one po tid x 5 days, then 2 po bid 11/19/23   Sater, Charlie DELENA, MD  midodrine  (PROAMATINE ) 2.5 MG tablet  Take 1 tablet (2.5 mg total) by mouth 3 (three) times daily with meals. 02/23/22   Leotis Bogus, MD  nortriptyline  (PAMELOR ) 25 MG capsule Take 1 capsule (25 mg total) by mouth at bedtime. 11/20/23   Sater, Charlie DELENA, MD  omeprazole  (PRILOSEC) 40 MG capsule Take 40 mg by mouth daily as needed (acid reflux).    [provider]  QUEtiapine  (SEROQUEL ) 100 MG tablet TAKE 1 TABLET BY MOUTH EVERYDAY AT BEDTIME 09/26/23   Sater, Charlie DELENA, MD    Allergies: Penicillins    Review of Systems  All other systems reviewed and are negative.   Updated Vital Signs BP 138/89   Pulse 95   SpO2 98%   Physical Exam Vitals and nursing note reviewed.  Constitutional:      General: She is not in acute distress.    Appearance: Normal appearance. She is not ill-appearing or diaphoretic.  HENT:     Head: Normocephalic and atraumatic.  Eyes:     General: No scleral icterus.       Right eye: No discharge.        Left eye: No discharge.     Extraocular Movements: Extraocular movements intact.     Conjunctiva/sclera: Conjunctivae normal.  Cardiovascular:     Rate and Rhythm: Normal rate and regular rhythm.     Pulses: Normal pulses.     Heart sounds: Normal heart  sounds. No murmur heard.    No friction rub. No gallop.  Pulmonary:     Effort: Pulmonary effort is normal. No respiratory distress.     Breath sounds: No stridor. No wheezing, rhonchi or rales.  Chest:     Chest wall: No tenderness.  Abdominal:     General: Abdomen is flat. There is no distension.     Palpations: Abdomen is soft.     Tenderness: There is no abdominal tenderness. There is no right CVA tenderness, left CVA tenderness, guarding or rebound.  Musculoskeletal:        General: No swelling, deformity or signs of injury. Normal range of motion.     Cervical back: Normal range of motion. No rigidity.     Right lower leg: No edema.     Left lower leg: No edema.     Comments: Both lower extremities are nonedematous, warm,  appropriate color, have less than 2-second cap refill in all digits, DP pulse present bilaterally.  DP pulse 1+ on right, 2+ on left.  Confirmed by Doppler ultrasound.  Sites were marked.  Skin:    General: Skin is warm and dry.     Capillary Refill: Capillary refill takes less than 2 seconds.     Findings: No bruising, erythema or lesion.  Neurological:     General: No focal deficit present.     Mental Status: She is alert and oriented to person, place, and time. Mental status is at baseline.     Sensory: No sensory deficit.     Motor: No weakness.     Coordination: Coordination normal.  Psychiatric:        Mood and Affect: Mood normal.     (all labs ordered are listed, but only abnormal results are displayed) Labs Reviewed  CBC  BASIC METABOLIC PANEL WITH GFR    EKG: None  Radiology: No results found.  Procedures   Medications Ordered in the ED - No data to display                              Medical Decision Making  This patient is a 68 year old female who presents to the ED for concern of reading her note from her hematologist which noted that she had no pulse fell on her right foot, called the triage line after the fall and was told to come to the ED for evaluation if she reportedly did not have a pulse in her right foot.  Not currently experiencing any ischemic limb symptoms.  Taking Lovenox  appropriately for DVT, PE, subdural venous sinus thrombosis and seeing specialist accordingly.  No missed doses.  On physical exam, patient is in no acute distress, afebrile, alert and orient x 4, speaking in full sentences, nontachypneic, nontachycardic.  Noted to have bilateral warm extremities bilaterally with no edema noted.  Good cap refill bilaterally, DP pulses present bilaterally noted to have slightly decreased pulse to right compared to left.  Patient is at baseline with no symptoms present at this time.  With patient's reassuring physical exam, reassuring labs and good  appropriate follow-up, believe that she is safe for discharge this time.  Provided strict return ER precautions.  Continue her to take Lovenox .    Patient vital signs have remained stable throughout the course of patient's time in the ED. Low suspicion for any other emergent pathology at this time. I believe this patient is safe to be discharged. Provided strict  return to ER precautions. Patient expressed agreement and understanding of plan. All questions were answered.  Differential diagnoses prior to evaluation: The emergent differential diagnosis includes, but is not limited to, ischemic limb, neuropathy, venous stasis, cellulitis,. This is not an exhaustive differential.   Past Medical History / Co-morbidities / Social History: Pacemaker, sinus pause, lumbar radiculopathy, cervical spondylosis, bipolar disorder, anxiety, antiphospholipid syndrome, migraines, vertigo  Additional history: Chart reviewed. Pertinent results include:   Last seen in the emergency department 11/11/2023 for bilateral leg pain, noted to have had many labs and imaging done for this problem and currently seeing multiple specialists.  Last seen by neurology on 11/19/2023 for antiphospholipid syndrome, bilateral lower extremity pain, gait disturbance, insomnia, memory loss, tremor.  Noted to have a myelogram on 10/06/2023 which showed an old L4-L5 surgery with no other major issues.  Continuing Lovenox  for APL syndrome for concerns for dural venous thrombosis.  Noted to have mild cognitive impairment.  Provided positive panel for anxiety which was believed to be the cause of tremors.  Added lamotrigine  for leg pain.  Saw [facility] yesterday.  Lab Tests/Imaging studies: I personally interpreted labs/imaging and the pertinent results include:  .  CBC unremarkable BMP unremarkable.  No imaging was warranted at this time, considered CT runoff of lower extremities.    Medications:  I have reviewed the patients home  medicines and have made adjustments as needed.  Critical Interventions: None  Social Determinants of Health: Has good follow-up with multiple specialist at this time scheduled.  Disposition: After consideration of the diagnostic results and the patients response to treatment, I feel that the patient would benefit from discharge and treatment as above.   emergency department workup does not suggest an emergent condition requiring admission or immediate intervention beyond what has been performed at this time. The plan is: Follow-up with specialists including hematology and neurology, continue Lovenox , monitor symptoms, return for new or worsening symptoms. The patient is safe for discharge and has been instructed to return immediately for worsening symptoms, change in symptoms or any other concerns.   Final diagnoses:  Decreased pulse    ED Discharge Orders     None          Beola Terrall GORMAN DEVONNA 12/03/23 2304    Theadore Ozell HERO, MD 12/04/23 [phone]

## 2023-12-03 NOTE — Discharge Instructions (Signed)
 You are seen today for reported decreased pulse to right lower extremity.  Your physical exam was very reassuring that at most suspicion of any emergent condition present at this time.  Will recommend you continue to monitor symptoms and if you begin to develop any chest pain, shortness of breath, faintness, cool/numb/weak lower extremity,, one-sided weakness or sudden onset confusion please return to ED for further evaluation.  Otherwise please to be sure to continue to follow-up with your specialists and with primary care.

## 2023-12-06 ENCOUNTER — Inpatient Hospital Stay (HOSPITAL_COMMUNITY): Admission: RE | Admit: 2023-12-06 | Source: Ambulatory Visit

## 2023-12-09 ENCOUNTER — Emergency Department (HOSPITAL_COMMUNITY)

## 2023-12-09 ENCOUNTER — Ambulatory Visit (HOSPITAL_COMMUNITY)
Admission: RE | Admit: 2023-12-09 | Discharge: 2023-12-09 | Disposition: A | Discharge status: Home / Self Care | Source: Ambulatory Visit | Attending: Family Medicine | Admitting: Family Medicine

## 2023-12-09 ENCOUNTER — Encounter (HOSPITAL_COMMUNITY): Payer: Self-pay | Admitting: *Deleted

## 2023-12-09 ENCOUNTER — Other Ambulatory Visit: Payer: Self-pay

## 2023-12-09 ENCOUNTER — Emergency Department (HOSPITAL_COMMUNITY)
Admission: EM | Admit: 2023-12-09 | Discharge: 2023-12-10 | Disposition: A | Discharge status: Home / Self Care | Source: Ambulatory Visit | Attending: Emergency Medicine | Admitting: Emergency Medicine

## 2023-12-09 DIAGNOSIS — R0602 Shortness of breath: Secondary | ICD-10-CM | POA: Insufficient documentation

## 2023-12-09 DIAGNOSIS — J9811 Atelectasis: Secondary | ICD-10-CM | POA: Diagnosis not present

## 2023-12-09 DIAGNOSIS — I739 Peripheral vascular disease, unspecified: Secondary | ICD-10-CM | POA: Insufficient documentation

## 2023-12-09 DIAGNOSIS — M79604 Pain in right leg: Secondary | ICD-10-CM | POA: Diagnosis not present

## 2023-12-09 DIAGNOSIS — G8929 Other chronic pain: Secondary | ICD-10-CM | POA: Diagnosis not present

## 2023-12-09 DIAGNOSIS — I517 Cardiomegaly: Secondary | ICD-10-CM | POA: Diagnosis not present

## 2023-12-09 DIAGNOSIS — J439 Emphysema, unspecified: Secondary | ICD-10-CM | POA: Diagnosis not present

## 2023-12-09 DIAGNOSIS — E876 Hypokalemia: Secondary | ICD-10-CM | POA: Insufficient documentation

## 2023-12-09 DIAGNOSIS — Z950 Presence of cardiac pacemaker: Secondary | ICD-10-CM | POA: Diagnosis not present

## 2023-12-09 HISTORY — DX: Other pulmonary embolism without acute cor pulmonale: I26.99

## 2023-12-09 HISTORY — DX: Acute embolism and thrombosis of unspecified deep veins of unspecified lower extremity: I82.409

## 2023-12-09 LAB — CBC
HCT: 42.9 % (ref 36.0–46.0)
Hemoglobin: 13.8 g/dL (ref 12.0–15.0)
MCH: 28 pg (ref 26.0–34.0)
MCHC: 32.2 g/dL (ref 30.0–36.0)
MCV: 87.2 fL (ref 80.0–100.0)
Platelets: 301 K/uL (ref 150–400)
RBC: 4.92 MIL/uL (ref 3.87–5.11)
RDW: 14.9 % (ref 11.5–15.5)
WBC: 10.5 K/uL (ref 4.0–10.5)
nRBC: 0 % (ref 0.0–0.2)

## 2023-12-09 LAB — BASIC METABOLIC PANEL WITH GFR
Anion gap: 11 (ref 5–15)
BUN: 11 mg/dL (ref 8–23)
CO2: 24 mmol/L (ref 22–32)
Calcium: 9.3 mg/dL (ref 8.9–10.3)
Chloride: 105 mmol/L (ref 98–111)
Creatinine, Ser: 0.95 mg/dL (ref 0.44–1.00)
GFR, Estimated: >60 mL/min (ref >60)
Glucose, Bld: 115 mg/dL — ABNORMAL HIGH (ref 70–99)
Potassium: 3.4 mmol/L — ABNORMAL LOW (ref 3.5–5.1)
Sodium: 140 mmol/L (ref 135–145)

## 2023-12-09 LAB — VAS US LOWER EXTREMITY EXERCISE
Left ABI: 1.13
Right ABI: 1.1

## 2023-12-09 MED ORDER — IOHEXOL 350 MG/ML SOLN
75.0000 mL | Freq: Once | INTRAVENOUS | Status: AC | PRN
Start: 2023-12-09 — End: 2023-12-09
  Administered 2023-12-09: 75 mL via INTRAVENOUS

## 2023-12-09 MED ORDER — HYDROMORPHONE HCL 1 MG/ML IJ SOLN
1.0000 mg | Freq: Once | INTRAMUSCULAR | Status: AC
  Administered 2023-12-09: 1 mg via INTRAVENOUS
  Filled 2023-12-09: qty 1

## 2023-12-09 MED ORDER — POTASSIUM CHLORIDE CRYS ER 20 MEQ PO TBCR
40.0000 meq | EXTENDED_RELEASE_TABLET | Freq: Once | ORAL | Status: AC
  Administered 2023-12-09: 40 meq via ORAL
  Filled 2023-12-09: qty 2

## 2023-12-09 NOTE — ED Provider Notes (Signed)
 [place] EMERGENCY DEPARTMENT AT [facility] Provider Note   CSN: [id number] Arrival date & time: 12/09/23  1956     Patient presents with: Leg Pain and Shortness of Breath   [name] is a 68 y.o. female.   Patient complains of shortness of breath that began tonight.  Patient has a known DVT in her right leg.  Patient reports today she was having vascular studies and began having severe cramping in her legs and could not tolerate the studies.  Patient reports that she has ongoing back pain that is managed by Dr. Gifford.  Patient reports that home pain medicine is no longer controlling the pain.  Patient denies any chest pain.  Patient has had a history of multiple blood clots.  Patient has had a dural sinus thrombosis a DVT in her right leg and a history of a previous pulmonary embolus.  Patient states she has not missed any of her blood thinner.  Patient is currently on Lovenox .    The history is provided by the patient. No language interpreter was used.  Leg Pain Shortness of Breath Associated symptoms: no chest pain        Prior to Admission medications   Medication Sig Start Date End Date Taking? Authorizing Provider  clonazePAM  (KLONOPIN ) 0.5 MG tablet Take 1 tablet (0.5 mg total) by mouth 2 (two) times daily. 07/24/23   Sater, Charlie DELENA, MD  desvenlafaxine (PRISTIQ) 25 MG 24 hr tablet Take 25 mg by mouth daily. 09/19/23   [provider]  donepezil  (ARICEPT ) 5 MG tablet Take 5 mg by mouth daily. 03/01/22   [provider]  enoxaparin  (LOVENOX ) 60 MG/0.6ML injection INJECT 0.5 MLS (50 MG TOTAL) INTO THE SKIN 2 (TWO) TIMES DAILY 10/28/23 02/25/24  Dorsey, John T IV, MD  lamoTRIgine  (LAMICTAL ) 25 MG tablet One po every day x 5 days, then one po bid x 5 days, then one po tid x 5 days, then 2 po bid 11/19/23   Sater, Charlie DELENA, MD  midodrine  (PROAMATINE ) 2.5 MG tablet Take 1 tablet (2.5 mg total) by mouth 3 (three) times daily with meals. 02/23/22   Leotis Bogus, MD  nortriptyline  (PAMELOR ) 25 MG capsule Take 1 capsule (25 mg total) by mouth at bedtime. 11/20/23   Sater, Charlie DELENA, MD  omeprazole  (PRILOSEC) 40 MG capsule Take 40 mg by mouth daily as needed (acid reflux).    [provider]  QUEtiapine  (SEROQUEL ) 100 MG tablet TAKE 1 TABLET BY MOUTH EVERYDAY AT BEDTIME 09/26/23   Sater, Charlie DELENA, MD    Allergies: Penicillins    Review of Systems  Respiratory:  Positive for shortness of breath.   Cardiovascular:  Negative for chest pain.  All other systems reviewed and are negative.   Updated Vital Signs BP (!) 142/100 (BP Location: Right Arm)   Pulse 95   Temp 98.4 F (36.9 C) (Oral)   Resp 17   Ht 5' 7 (1.702 m)   Wt 51.9 kg   SpO2 98%   BMI 17.92 kg/m   Physical Exam Vitals reviewed.  Constitutional:      Appearance: She is well-developed.  Cardiovascular:     Rate and Rhythm: Normal rate and regular rhythm.  Pulmonary:     Effort: Pulmonary effort is normal.     Breath sounds: No decreased breath sounds or wheezing.  Chest:     Chest wall: No tenderness.  Musculoskeletal:        General: Normal  range of motion.     Cervical back: Normal range of motion.  Skin:    General: Skin is warm.  Neurological:     General: No focal deficit present.     Mental Status: She is alert.     (all labs ordered are listed, but only abnormal results are displayed) Labs Reviewed  BASIC METABOLIC PANEL WITH GFR - Abnormal; Notable for the following components:      Result Value   Potassium 3.4 (*)    Glucose, Bld 115 (*)    All other components within normal limits  CBC    EKG: None  Radiology: CT Angio Chest PE W and/or Wo Contrast Result Date: 12/09/2023 CLINICAL DATA:  Evaluate for pulmonary embolism. EXAM: CT ANGIOGRAPHY CHEST WITH CONTRAST TECHNIQUE: Multidetector CT imaging of the chest was performed using the standard protocol during bolus administration of intravenous contrast. Multiplanar CT image  reconstructions and MIPs were obtained to evaluate the vascular anatomy. RADIATION DOSE REDUCTION: This exam was performed according to the departmental dose-optimization program which includes automated exposure control, adjustment of the mA and/or kV according to patient size and/or use of iterative reconstruction technique. CONTRAST:  75mL OMNIPAQUE  IOHEXOL  350 MG/ML SOLN COMPARISON:  CT chest 07/23/2023. FINDINGS: Cardiovascular: Satisfactory opacification of the pulmonary arteries to the segmental level. No evidence of pulmonary embolism. Heart is mildly enlarged. Left-sided pacemaker is present. Mediastinum/Nodes: No enlarged mediastinal, hilar, or axillary lymph nodes. Thyroid  gland, trachea, and esophagus demonstrate no significant findings. Lungs/Pleura: There is dependent atelectasis in the bilateral lower lobes. Mild emphysema present. No pneumothorax or pleural effusion. Evaluation is limited by respiratory motion. Upper Abdomen: No acute abnormality. Musculoskeletal: There are degenerative changes of the spine. Bones are osteopenic. Rounded lucent lesion in T11 is unchanged. Review of the MIP images confirms the above findings. IMPRESSION: 1. No evidence for pulmonary embolism. 2. Mild cardiomegaly. 3. Mild emphysema. Emphysema (ICD10-J43.9). Electronically Signed   By: Greig Pique M.D.   On: 12/09/2023 22:47   DG Chest 2 View Result Date: 12/09/2023 CLINICAL DATA:  Shortness of breath EXAM: CHEST - 2 VIEW COMPARISON:  01/04/2023 FINDINGS: Left pacer remains in place, unchanged. Heart and mediastinal contours are within normal limits. No focal opacities or effusions. No acute bony abnormality. IMPRESSION: No active cardiopulmonary disease. Electronically Signed   By: Franky Crease M.D.   On: 12/09/2023 21:01   VAS US  LOWER EXTREMITY EXERCISE Result Date: 12/09/2023  LOWER EXTREMITY DOPPLER STUDY Patient Name:  [name]  Date of Exam:   12/09/2023 Medical Rec #: [id number]       Accession #:     [id number] Date of Birth: [birth year]       Patient Gender: F Patient Age:   29 years Exam Location:  [address] Procedure:      VAS US  ABI WITH/WO TBI Referring Phys: --------------------------------------------------------------------------------  Indications: Claudication. Constant bilateral leg pain, worse with inclines and              going up steps. High Risk Factors: Hyperlipidemia, current smoker. Pacemaker. Back surgery. Other Factors: No pror exam.  Performing Technologist: Devere Dark RVT  Examination Guidelines: A complete evaluation includes at minimum, Doppler waveform signals and systolic blood pressure reading at the level of bilateral brachial, anterior tibial, and posterior tibial arteries, when vessel segments are accessible. Bilateral testing is considered an integral part of a complete examination. Photoelectric Plethysmograph (PPG) waveforms and toe systolic pressure readings are included as required and additional duplex testing as  needed. Limited examinations for reoccurring indications may be performed as noted.  ABI Findings: +--------+------------------+-----+---------+--------+ Right   Rt Pressure (mmHg)IndexWaveform Comment  +--------+------------------+-----+---------+--------+ [id number]                                      +--------+------------------+-----+---------+--------+ PTA     150               1.10 biphasic          +--------+------------------+-----+---------+--------+ DP      140               1.03 triphasic         +--------+------------------+-----+---------+--------+ +--------+------------------+-----+---------+-------+ Left    Lt Pressure (mmHg)IndexWaveform Comment +--------+------------------+-----+---------+-------+ [id number]                                     +--------+------------------+-----+---------+-------+ PTA     154               1.13 biphasic         +--------+------------------+-----+---------+-------+ DP       150               1.10 triphasic        +--------+------------------+-----+---------+-------+ +-------+-----------+-----------+------------+------------+ ABI/TBIToday's ABIToday's TBIPrevious ABIPrevious TBI +-------+-----------+-----------+------------+------------+ Right  1.10                                           +-------+-----------+-----------+------------+------------+ Left   1.13                                           +-------+-----------+-----------+------------+------------+ Exercise: The patient walked on the treadmill for 45 seconds minutes, at 2.5 mph mph and a 12% incline. +----------+----------+-------+--------------+ Time      Location  SymptomComments       +----------+----------+-------+--------------+ 45 secondsentire legPain   Immediate pain +----------+----------+-------+--------------+ After exercise the right ABI increased, which is a normal exercise response. After exercise the left ABI increased, which is a normal exercise response. Patient unable to tolerate exercise exam, this is therefore a non-diagnostic exam due to patients inability to walk any distance.   Summary: Right: Resting right ankle-brachial index is within normal range.  Left: Resting left ankle-brachial index is within normal range.  *See table(s) above for measurements and observations.  Electronically signed by Gaile New MD on 12/09/2023 at 4:01:57 PM.    Final      Procedures   Medications Ordered in the ED  iohexol  (OMNIPAQUE ) 350 MG/ML injection 75 mL (75 mLs Intravenous Contrast Given 12/09/23 2227)  HYDROmorphone  (DILAUDID ) injection 1 mg (1 mg Intravenous Given 12/09/23 2321)  potassium chloride  SA (KLOR-CON  M) CR tablet 40 mEq (40 mEq Oral Given 12/09/23 2321)                                    Medical Decision Making Patient complains of shortness of breath tonight.  She reports she has a past medical history of pulmonary embolism.  She has a known DVT in  her right leg.  Amount and/or Complexity  of Data Reviewed Labs: ordered. Decision-making details documented in ED Course.    Details: Labs ordered reviewed and interpreted.  Patient's potassium is 3.4 Radiology: ordered.    Details: CT angio chest no evidence of PE ECG/medicine tests: ordered and independent interpretation performed. Decision-making details documented in ED Course.    Details: EKG normal sinus no acute abnormalities  Risk Prescription drug management. Risk Details: Patient is counseled on results.  Patient is advised to contact Dr. Bonner for concerns about ongoing pain control.         Final diagnoses:  Shortness of breath  Other chronic pain    ED Discharge Orders     None      An After Visit Summary was printed and given to the patient.     Flint Sonny POUR, PA-C 12/09/23 [id number]    Bari Roxie HERO, DO 12/17/23 2238

## 2023-12-09 NOTE — ED Triage Notes (Signed)
 Bilateral leg pain for weeks, known DVT in the right leg. Onset of SOB today while resting today, lasting several hours, now better. Reports hx of PE's. Denies pain in her chest. She says she had a test to check the blood flow in her legs today, but after 30 seconds she could not tolerate it. Last took Oxy 10 about 1.5 hours ago- they really don't work

## 2023-12-10 ENCOUNTER — Telehealth: Payer: Self-pay | Admitting: Neurology

## 2023-12-10 NOTE — Telephone Encounter (Signed)
 Pt is asking if something else other than nortriptyline  (PAMELOR ) 25 MG capsule  can be called in for the pain in her legs.

## 2023-12-10 NOTE — Telephone Encounter (Signed)
 Patient was in the emergy room last night and was given a shot, but does not remeber the medication used for the injection. She is still having issues with leg pain and would like an alternative medication due to the nortriptyline  not working for her. Patient was not prescribed any medication from the emergency room.   - patient has been informed that Dr. Vear is out of the office and that this message will be sent to the work in doctor.  Sent to the am work in doctor for 12/11/23

## 2023-12-11 ENCOUNTER — Telehealth: Payer: Self-pay | Admitting: *Deleted

## 2023-12-11 NOTE — Telephone Encounter (Signed)
 I called the patient and she plans to increase the nortriptyline  to 50mg . She verbalized understanding that she needs to call and let us  know if the increase helped with her leg pain to get an adjusted prescription. If medication dose has not helped she will follow-up with Dr. Vear when he returns.

## 2023-12-11 NOTE — Telephone Encounter (Signed)
 For now, I recommend, she increase nortriptyline  to 50 mg which would be 2 pills daily.  If this works out in the next few days, she can call us  and we can adjust the prescription accordingly.  Any different or alternative medication regimen would have to be discussed with Dr. Vear upon his return as she is on multiple medications already.

## 2023-12-11 NOTE — Telephone Encounter (Signed)
 Received call from pt. She is asking if she needs to se Dr. Federico sooner than her currently scheduled apt in December. She states she is having leg pain and went to the ED 2 days ago because of it. She had bilateral doppler studies of both LE-no DVTs noted. Had CT angio of the chest as well and this was negative for PE. She is taking her Lovenox  as ordered. Advised that from a hematology perspective, there is notinig to add to her work up done in the ED. Advised to continue working with her neurologist and PCP regarding her leg pain. She voiced understanding and is aware of her appt here in December.

## 2023-12-13 ENCOUNTER — Other Ambulatory Visit: Payer: Self-pay | Admitting: Neurology

## 2023-12-20 ENCOUNTER — Other Ambulatory Visit: Payer: Self-pay

## 2023-12-20 ENCOUNTER — Emergency Department (HOSPITAL_COMMUNITY)

## 2023-12-20 ENCOUNTER — Other Ambulatory Visit (HOSPITAL_COMMUNITY): Payer: Self-pay

## 2023-12-20 ENCOUNTER — Emergency Department (HOSPITAL_COMMUNITY)
Admission: EM | Admit: 2023-12-20 | Discharge: 2023-12-20 | Disposition: A | Discharge status: Home / Self Care | Attending: Emergency Medicine | Admitting: Emergency Medicine

## 2023-12-20 DIAGNOSIS — Z7901 Long term (current) use of anticoagulants: Secondary | ICD-10-CM | POA: Diagnosis not present

## 2023-12-20 DIAGNOSIS — R404 Transient alteration of awareness: Secondary | ICD-10-CM | POA: Diagnosis not present

## 2023-12-20 DIAGNOSIS — I700 Atherosclerosis of aorta: Secondary | ICD-10-CM | POA: Diagnosis not present

## 2023-12-20 DIAGNOSIS — R630 Anorexia: Secondary | ICD-10-CM | POA: Diagnosis not present

## 2023-12-20 DIAGNOSIS — R110 Nausea: Secondary | ICD-10-CM | POA: Insufficient documentation

## 2023-12-20 DIAGNOSIS — R55 Syncope and collapse: Secondary | ICD-10-CM | POA: Diagnosis not present

## 2023-12-20 DIAGNOSIS — R42 Dizziness and giddiness: Secondary | ICD-10-CM | POA: Diagnosis not present

## 2023-12-20 DIAGNOSIS — Z86711 Personal history of pulmonary embolism: Secondary | ICD-10-CM | POA: Diagnosis not present

## 2023-12-20 DIAGNOSIS — J449 Chronic obstructive pulmonary disease, unspecified: Secondary | ICD-10-CM | POA: Insufficient documentation

## 2023-12-20 DIAGNOSIS — F17200 Nicotine dependence, unspecified, uncomplicated: Secondary | ICD-10-CM | POA: Insufficient documentation

## 2023-12-20 DIAGNOSIS — I672 Cerebral atherosclerosis: Secondary | ICD-10-CM | POA: Diagnosis not present

## 2023-12-20 DIAGNOSIS — Z950 Presence of cardiac pacemaker: Secondary | ICD-10-CM | POA: Insufficient documentation

## 2023-12-20 DIAGNOSIS — R531 Weakness: Secondary | ICD-10-CM | POA: Diagnosis not present

## 2023-12-20 LAB — CBC
HCT: 37 % (ref 36.0–46.0)
Hemoglobin: 11.7 g/dL — ABNORMAL LOW (ref 12.0–15.0)
MCH: 27.9 pg (ref 26.0–34.0)
MCHC: 31.6 g/dL (ref 30.0–36.0)
MCV: 88.3 fL (ref 80.0–100.0)
Platelets: 264 K/uL (ref 150–400)
RBC: 4.19 MIL/uL (ref 3.87–5.11)
RDW: 15 % (ref 11.5–15.5)
WBC: 8.2 K/uL (ref 4.0–10.5)
nRBC: 0 % (ref 0.0–0.2)

## 2023-12-20 LAB — COMPREHENSIVE METABOLIC PANEL WITH GFR
ALT: 7 U/L (ref 0–44)
AST: 13 U/L — ABNORMAL LOW (ref 15–41)
Albumin: 4 g/dL (ref 3.5–5.0)
Alkaline Phosphatase: 67 U/L (ref 38–126)
Anion gap: 9 (ref 5–15)
BUN: 12 mg/dL (ref 8–23)
CO2: 25 mmol/L (ref 22–32)
Calcium: 9.1 mg/dL (ref 8.9–10.3)
Chloride: 107 mmol/L (ref 98–111)
Creatinine, Ser: 0.82 mg/dL (ref 0.44–1.00)
GFR, Estimated: >60 mL/min (ref >60)
Glucose, Bld: 92 mg/dL (ref 70–99)
Potassium: 3.8 mmol/L (ref 3.5–5.1)
Sodium: 140 mmol/L (ref 135–145)
Total Bilirubin: 0.7 mg/dL (ref 0.0–1.2)
Total Protein: 5.8 g/dL — ABNORMAL LOW (ref 6.5–8.1)

## 2023-12-20 LAB — PRO BRAIN NATRIURETIC PEPTIDE: Pro Brain Natriuretic Peptide: 98.9 pg/mL (ref <300.0)

## 2023-12-20 LAB — BLOOD GAS, VENOUS
Acid-Base Excess: 1 mmol/L (ref 0.0–2.0)
Bicarbonate: 27.1 mmol/L (ref 20.0–28.0)
O2 Saturation: 48.7 %
Patient temperature: 37
pCO2, Ven: 48 mmHg (ref 44–60)
pH, Ven: 7.36 (ref 7.25–7.43)
pO2, Ven: <31 mmHg — CL (ref 32–45)

## 2023-12-20 LAB — TROPONIN T, HIGH SENSITIVITY: Troponin T High Sensitivity: <15 ng/L (ref 0–19)

## 2023-12-20 MED ORDER — ONDANSETRON 8 MG PO TBDP: 8.0000 mg | ORAL_TABLET | Freq: Three times a day (TID) | ORAL | 0 refills | Status: AC | PRN

## 2023-12-20 MED ORDER — ONDANSETRON 8 MG PO TBDP
8.0000 mg | ORAL_TABLET | Freq: Three times a day (TID) | ORAL | 0 refills | Status: DC | PRN
  Filled 2023-12-20: qty 12, 4d supply, fill #0

## 2023-12-20 NOTE — ED Provider Notes (Signed)
 [place] EMERGENCY DEPARTMENT AT [facility] Provider Note   CSN: [id number] Arrival date & time: 12/20/23  1533     Patient presents with: Dizziness and Nausea   [name] is a 68 y.o. female.    Dizziness  Patient has a history of syncope anxiety vertigo chronic tobacco use, bipolar disorder, dysrhythmia status post [place] maker, pulmonary embolism, DVT.  Patient states she has been having symptoms ongoing for last few days.  Patient states she been having some nausea and decreased appetite.  She has been having episodes of dizziness.  Patient described as a sensation of feeling like she might pass out.  She denies that it is a room spinning sensation.  Patient states she is not having any trouble with her speech.  No trouble with her vision.  No focal numbness or weakness.  Patient also states she has been feeling a little bit more short of breath lately.  Gets worse with activity.  She has not had any fevers or coughing.  Patient does have history of COPD and does continue to smoke    Prior to Admission medications   Medication Sig Start Date End Date Taking? Authorizing Provider  ondansetron  (ZOFRAN -ODT) 8 MG disintegrating tablet Take 1 tablet (8 mg total) by mouth every 8 (eight) hours as needed for nausea or vomiting. 12/20/23  Yes Randol Simmonds, MD  clonazePAM  (KLONOPIN ) 0.5 MG tablet Take 1 tablet (0.5 mg total) by mouth 2 (two) times daily. 07/24/23   Sater, Charlie DELENA, MD  desvenlafaxine (PRISTIQ) 25 MG 24 hr tablet Take 25 mg by mouth daily. 09/19/23   [provider]  donepezil  (ARICEPT ) 5 MG tablet Take 5 mg by mouth daily. 03/01/22   [provider]  enoxaparin  (LOVENOX ) 60 MG/0.6ML injection INJECT 0.5 MLS (50 MG TOTAL) INTO THE SKIN 2 (TWO) TIMES DAILY 10/28/23 02/25/24  Dorsey, John T IV, MD  lamoTRIgine  (LAMICTAL ) 25 MG tablet One po every day x 5 days, then one po bid x 5 days, then one po tid x 5 days, then 2 po bid 11/19/23   Sater, Charlie DELENA,  MD  midodrine  (PROAMATINE ) 2.5 MG tablet Take 1 tablet (2.5 mg total) by mouth 3 (three) times daily with meals. 02/23/22   Leotis Bogus, MD  nortriptyline  (PAMELOR ) 25 MG capsule TAKE 1 CAPSULE BY MOUTH AT BEDTIME. 12/13/23   Sater, Charlie DELENA, MD  omeprazole  (PRILOSEC) 40 MG capsule Take 40 mg by mouth daily as needed (acid reflux).    [provider]  QUEtiapine  (SEROQUEL ) 100 MG tablet TAKE 1 TABLET BY MOUTH EVERYDAY AT BEDTIME 09/26/23   Sater, Charlie DELENA, MD    Allergies: Penicillins    Review of Systems  Neurological:  Positive for dizziness.    Updated Vital Signs BP (!) 141/90 (BP Location: Right Arm)   Pulse 72   Temp (!) 97.3 F (36.3 C) (Oral)   Resp 16   SpO2 100%   Physical Exam Vitals and nursing note reviewed.  Constitutional:      General: She is not in acute distress.    Appearance: She is well-developed.     Comments: Underweight  HENT:     Head: Normocephalic and atraumatic.     Right Ear: External ear normal.     Left Ear: External ear normal.  Eyes:     General: No visual field deficit or scleral icterus.       Right eye: No discharge.  Left eye: No discharge.     Conjunctiva/sclera: Conjunctivae normal.  Neck:     Trachea: No tracheal deviation.  Cardiovascular:     Rate and Rhythm: Normal rate and regular rhythm.  Pulmonary:     Effort: Pulmonary effort is normal. No respiratory distress.     Breath sounds: Normal breath sounds. No stridor. No wheezing or rales.  Abdominal:     General: Bowel sounds are normal. There is no distension.     Palpations: Abdomen is soft.     Tenderness: There is no abdominal tenderness. There is no guarding or rebound.  Musculoskeletal:        General: No tenderness.     Cervical back: Neck supple.  Skin:    General: Skin is warm and dry.     Findings: No rash.  Neurological:     Mental Status: She is alert and oriented to person, place, and time.     Cranial Nerves: No cranial nerve deficit,  dysarthria or facial asymmetry.     Sensory: No sensory deficit.     Motor: No abnormal muscle tone, seizure activity or pronator drift.     Coordination: Coordination normal.     Comments:  able to hold both legs off bed for 5 seconds, sensation intact in all extremities,  no left or right sided neglect, normal finger-nose exam bilaterally, no nystagmus noted   Psychiatric:        Mood and Affect: Mood normal.     (all labs ordered are listed, but only abnormal results are displayed) Labs Reviewed  CBC - Abnormal; Notable for the following components:      Result Value   Hemoglobin 11.7 (*)    All other components within normal limits  COMPREHENSIVE METABOLIC PANEL WITH GFR - Abnormal; Notable for the following components:   Total Protein 5.8 (*)    AST 13 (*)    All other components within normal limits  BLOOD GAS, VENOUS - Abnormal; Notable for the following components:   pO2, Ven <31 (*)    All other components within normal limits  PRO BRAIN NATRIURETIC PEPTIDE  TROPONIN T, HIGH SENSITIVITY    EKG: None  Radiology: DG Chest 2 View Result Date: 12/20/2023 CLINICAL DATA:  Weakness.  Nausea and dizziness. EXAM: CHEST - 2 VIEW COMPARISON:  12/09/2023 radiograph and CT FINDINGS: Left-sided pacemaker in place.The cardiomediastinal contours are normal. Aortic atherosclerosis. The lungs are clear. Pulmonary vasculature is normal. No consolidation, pleural effusion, or pneumothorax. No acute osseous abnormalities are seen. IMPRESSION: No active cardiopulmonary disease. Electronically Signed   By: Andrea Gasman M.D.   On: 12/20/2023 17:15   CT Head Wo Contrast Result Date: 12/20/2023 CLINICAL DATA:  Provided history: Neuro deficit, acute, stroke suspected Patient reports headache, nausea, dizziness. EXAM: CT HEAD WITHOUT CONTRAST TECHNIQUE: Contiguous axial images were obtained from the base of the skull through the vertex without intravenous contrast. RADIATION DOSE REDUCTION: This  exam was performed according to the departmental dose-optimization program which includes automated exposure control, adjustment of the mA and/or kV according to patient size and/or use of iterative reconstruction technique. COMPARISON:  Head CT and CTA 04/30/2022 FINDINGS: Brain: No intracranial hemorrhage, mass effect, or midline shift. No hydrocephalus. The basilar cisterns are patent. No evidence of territorial infarct or acute ischemia. No extra-axial or intracranial fluid collection. Vascular: Atherosclerosis of skullbase vasculature without hyperdense vessel or abnormal calcification. Skull: No fracture or focal lesion. Sinuses/Orbits: No acute finding. Other: None. IMPRESSION: No acute intracranial  abnormality. Electronically Signed   By: Andrea Gasman M.D.   On: 12/20/2023 17:14     Procedures   Medications Ordered in the ED - No data to display  Clinical Course as of 12/20/23 1741  Fri Dec 20, 2023  1730 Blood gas, venous (at [facility])(!!) No acidosis noted.  CBC normal.  Metabolic panel normal. [JK]  1731 Previous records reviewed.  Patient had a CT angiogram performed on her chest on September 22.  Negative PE [JK]  1732 Head CT without acute finding [JK]  1732 Chest x-ray without acute finding. [JK]  1732 Pro Brain natriuretic peptide BMP normal.  Troponin normal.  CBC normal. [JK]    Clinical Course User Index [JK] Randol Simmonds, MD                                 Medical Decision Making Differential diagnosis includes but not limited to electrolyte abnormalities, anemia, pneumonia, cerebral hemorrhage  Problems Addressed: Lightheadedness: acute illness or injury that poses a threat to life or bodily functions Nausea: acute illness or injury that poses a threat to life or bodily functions  Amount and/or Complexity of Data Reviewed Labs: ordered. Decision-making details documented in ED Course. Radiology: ordered.  Risk Prescription drug management.   Patient  presented to the ED for evaluation of dizziness nausea.  Patient saw her primary care doctor.  There was concern about the possibility of cerebral hemorrhage per the notes.  Patient specifically denies vertigo to me.  She describes more of a lightheaded when she is standing.  Labs do not show any signs of anemia.  No significant dehydration.  No electrolyte abnormalities.  Patient does have any signs of respiratory acidosis.  Findings not suggestive of heart failure or pneumonia or acute coronary syndrome.  Hemoglobin slightly decreased however patient is not having any symptoms of GI bleeding.  She denies any blood in her stool.  No dark stools.  CT without acute abnormality.   ED workup reassuring.  No clear etiology of her symptoms.  Will have her take Zofran  to help with nausea.  Warning signs precautions discussed     Final diagnoses:  Nausea  Lightheadedness    ED Discharge Orders          Ordered    ondansetron  (ZOFRAN -ODT) 8 MG disintegrating tablet  Every 8 hours PRN        12/20/23 1740               Randol Simmonds, MD 12/20/23 1744

## 2023-12-20 NOTE — ED Triage Notes (Signed)
 Pt BIBA from doctor's office for headache, nausea and dizziness.  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  Pt takes Eliqus for clotting.  Denies pain at this time.  4mg  zofran  given by EMS  V/S within range

## 2023-12-20 NOTE — Discharge Instructions (Addendum)
 The test today in the ED did not show any signs of hemorrhage or other abnormality on CT scan.  Your blood tests are reassuring.  Take the medication to help with nausea.  Continue your home medications.  Follow-up with your doctor next week to be rechecked if your symptoms have not resolved.  Return to the emergency room if you start having fever, chest pain, blood in your stool, dark-colored stools vision trouble, weakness or other concerning symptoms

## 2023-12-22 ENCOUNTER — Other Ambulatory Visit (HOSPITAL_COMMUNITY): Payer: Self-pay

## 2023-12-24 ENCOUNTER — Other Ambulatory Visit: Payer: Self-pay | Admitting: Family Medicine

## 2023-12-24 DIAGNOSIS — R42 Dizziness and giddiness: Secondary | ICD-10-CM | POA: Diagnosis not present

## 2023-12-24 DIAGNOSIS — R110 Nausea: Secondary | ICD-10-CM | POA: Diagnosis not present

## 2023-12-24 DIAGNOSIS — R109 Unspecified abdominal pain: Secondary | ICD-10-CM

## 2023-12-24 DIAGNOSIS — R55 Syncope and collapse: Secondary | ICD-10-CM | POA: Diagnosis not present

## 2023-12-26 DIAGNOSIS — M51361 Other intervertebral disc degeneration, lumbar region with lower extremity pain only: Secondary | ICD-10-CM | POA: Diagnosis not present

## 2023-12-26 DIAGNOSIS — M5442 Lumbago with sciatica, left side: Secondary | ICD-10-CM | POA: Diagnosis not present

## 2023-12-26 DIAGNOSIS — M79661 Pain in right lower leg: Secondary | ICD-10-CM | POA: Diagnosis not present

## 2023-12-26 DIAGNOSIS — M5416 Radiculopathy, lumbar region: Secondary | ICD-10-CM | POA: Diagnosis not present

## 2023-12-26 DIAGNOSIS — M961 Postlaminectomy syndrome, not elsewhere classified: Secondary | ICD-10-CM | POA: Diagnosis not present

## 2023-12-26 DIAGNOSIS — G8929 Other chronic pain: Secondary | ICD-10-CM | POA: Diagnosis not present

## 2023-12-26 DIAGNOSIS — M5441 Lumbago with sciatica, right side: Secondary | ICD-10-CM | POA: Diagnosis not present

## 2023-12-27 DIAGNOSIS — Z23 Encounter for immunization: Secondary | ICD-10-CM | POA: Diagnosis not present

## 2023-12-27 DIAGNOSIS — M545 Low back pain, unspecified: Secondary | ICD-10-CM | POA: Diagnosis not present

## 2023-12-27 DIAGNOSIS — I739 Peripheral vascular disease, unspecified: Secondary | ICD-10-CM | POA: Diagnosis not present

## 2023-12-30 ENCOUNTER — Other Ambulatory Visit

## 2023-12-30 ENCOUNTER — Telehealth: Payer: Self-pay | Admitting: Surgery

## 2023-12-30 NOTE — Telephone Encounter (Signed)
 Returned pt call. Pt states she was told by her pain doctor that she does not have blood clots in her legs and so is wondering if she still needs to be on blood thinners. Advised patient not to stop blood thinners without specific instruction from her doctor, and that Dr Federico would be notified of her question. Pt educated that sometimes in patient with a history of blood clots blood thinners may be needed long term, and the importance on not stopping her medications without talking to her doctor first. Reminded patient of her upcoming appointment with [facility] on 11/4 and advised that she continue her blood thinners and bring up her concerns at that appointment. She verbalized understanding and had no further questions.

## 2024-01-11 ENCOUNTER — Other Ambulatory Visit: Payer: Self-pay | Admitting: Hematology and Oncology

## 2024-01-20 ENCOUNTER — Other Ambulatory Visit: Payer: Self-pay | Admitting: Physician Assistant

## 2024-01-20 DIAGNOSIS — Z86718 Personal history of other venous thrombosis and embolism: Secondary | ICD-10-CM

## 2024-01-20 NOTE — Progress Notes (Unsigned)
 [facility] Telephone:([phone]   [phone]  PROGRESS NOTE  Patient Care Team: Regino Slater, MD as PCP - General (Family Medicine) Rhys Verneita ONEIDA DEVONNA as Physician Assistant (Psychiatry)  Hematological/Oncological History 1) 10/23/2021-11/11/2021: Admitted In [place]  staying 45 seconds and witnessed by her daughter.  CT imaging revealed an acute right lobar, segmental and subsegmental pulmonary emboli.  Lower extremity Doppler was positive for DVT.  She was started on IV heparin .  Hypercoagulable testing was performed.  Findings revealed beta-2  glycoprotein IgM elevated greater than 150 and anticardiolipin antibodies IgM elevated above 103.5.Patient was discharged on Lovenox  bridging to Coumadin  due to concern for possible antiphospholipid syndrome.  2. 11/18/2021-11/26/2021: Readmitted at [facility] due to altered mental status and was found to have a UTI and therapeutic INR. Her INR was found to be 3.6 and subsequently 4.2 on 11/22/2021.  Patient was switched to Lovenox  1 mg/kg twice daily due to difficulty controlling her INRs.  3. 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 female returns for a follow up visit.  She is unaccompanied for this visit.  [name] reports her energy and appetite are unchanged since the last visit. She reports she is compliant with her lovenox  injections without any toxicities such as easy bruising or overt signs of bleeding. She is c/o frontal headache that has been present for the past week. She has been hesitant to take aspirin and found minimal relief with tylenol . She denies any neurological deficits such as vision changes or dizziness. She denies any signs or symptoms of thrombosis including leg pain, leg swelling, shortness of breath or chest pain. She has no  other complaints. Rest of the ROS is below.   MEDICAL HISTORY:  Past Medical History:  Diagnosis Date   Anxiety    anxiety and mild depressive order systoms   Bipolar disorder (HCC)     (02/20/2013)   Cervical cancer (HCC) 03/19/1980   Cholelithiasis 11/11/2021   Depression    DVT (deep venous thrombosis) (HCC)    Dysrhythmia    Headache(784.0)    monthly (02/20/2013)   Hyperlipidemia 11/11/2021   Pacemaker    medtronic   PONV (postoperative nausea and vomiting)    Pulmonary embolism (HCC)    Sinus pause 02/14/2013   Syncope and collapse    echo-April 12,2012-EF 55% Echo normal; recorder with prolonged sinus pauses --> status post   Tobacco abuse    Vertigo     SURGICAL HISTORY: Past Surgical History:  Procedure Laterality Date   ABDOMINAL HYSTERECTOMY  03/19/1980   partial   BACK SURGERY     CERVICAL FUSION Left 11/2013   C4-C6   CERVICAL FUSION     3,4,5,   INSERT / REPLACE / REMOVE PACEMAKER  02/20/2013   medtronic   LOOP RECORDER IMPLANT N/A 11/06/2012   Procedure: LINQ LOOP RECORDER IMPLANT;  Surgeon: Jerel Balding, MD;  Location: [facility] CATH LAB;  Service: Cardiovascular;  Laterality: N/A;   NECK SURGERY  11/17/2013   PACEMAKER PLACEMENT N/A 02/16/2013   PERMANENT PACEMAKER INSERTION N/A 02/20/2013   Procedure: PERMANENT PACEMAKER INSERTION;  Surgeon: Jerel Balding, MD;  Location: [facility] CATH LAB;  Service: Cardiovascular;  Laterality: N/A;   POSTERIOR LUMBAR FUSION  ~ 2009   ROBOTIC ASSISTED SALPINGO OOPHERECTOMY Bilateral 11/25/2018   Procedure: XI ROBOTIC ASSISTED BILATERAL SALPINGO OOPHORECTOMY and LYSIS OF ADHESIONS.;  Surgeon: Eloy Herring, MD;  Location: [facility] ORS;  Service: Gynecology;  Laterality: Bilateral;   SPINAL FUSION  03/19/2014   TONSILLECTOMY     TRANSTHORACIC ECHOCARDIOGRAM  07/11/2010   The left atrial size is normal.There is no evidence of mitral vavle prolaspe.Right ventriicular systolic pressure is normal . Injection of contrast documented no  interatrial shunt. Essentially normal 2D echo -doppler study     SOCIAL HISTORY: Social History   Socioeconomic History   Marital status: Widowed    Spouse name: Not on file   Number of children: 3   Years of education: 58   Highest education level: Not on file  Occupational History   Not on file  Tobacco Use   Smoking status: Every Day    Current packs/day: 1.00    Average packs/day: 0.5 packs/day for 40.5 years (20.5 ttl pk-yrs)    Types: Cigarettes    Start date: 07/2023   Smokeless tobacco: Never  Vaping Use   Vaping status: Never Used  Substance and Sexual Activity   Alcohol  use: No   Drug use: No   Sexual activity: Yes  Other Topics Concern   Not on file  Social History Narrative   Married 29 years . Has 3 children. Current smoker -1 pack a day-for 36 years .Alcohol  :  1 beer on occasion.   She is soon to be a grandmother.  His before to going on a trip up to the [place]  area to be closer to family when the baby is born.  She is extremely excited.   She very much would like to move back up to that area to be closer to family, but the whether is it just too cold in the winter.   Right handed    Soda daily   Lives with daughter    Social Drivers of Health   Financial Resource Strain: Low Risk  (10/11/2020)   Overall Financial Resource Strain (CARDIA)    Difficulty of Paying Living Expenses: Not hard at all  Food Insecurity: No Food Insecurity (11/06/2022)   Hunger Vital Sign    Worried About Running Out of Food in the Last Year: Never true    Ran Out of Food in the Last Year: Never true  Transportation Needs: No Transportation Needs (11/06/2022)   PRAPARE - Administrator, Civil Service (Medical): No    Lack of Transportation (Non-Medical): No  Physical Activity: Not on file  Stress: No Stress Concern Present (10/11/2020)   [employer] of Occupational Health - Occupational Stress Questionnaire    Feeling of Stress : Only a little   Social Connections: Not on file  Intimate Partner Violence: Not At Risk (11/22/2021)   Humiliation, Afraid, Rape, and Kick questionnaire    Fear of Current or Ex-Partner: No    Emotionally Abused: No    Physically Abused: No    Sexually Abused: No    FAMILY HISTORY: Family History  Adopted: Yes  Problem Relation Age of Onset   Heart attack Brother     ALLERGIES:  is allergic to penicillins.  MEDICATIONS:  Current Outpatient Medications  Medication Sig Dispense Refill   clonazePAM  (KLONOPIN ) 0.5 MG tablet Take 1 tablet (0.5 mg total) by mouth 2 (two) times daily. 60 tablet 3   desvenlafaxine (PRISTIQ) 25 MG 24 hr tablet Take 25 mg by mouth daily.     donepezil  (ARICEPT ) 5 MG tablet Take 5 mg by mouth daily.     enoxaparin  (LOVENOX ) 60  MG/0.6ML injection INJECT 0.5 MLS (50 MG TOTAL) INTO THE SKIN 2 (TWO) TIMES DAILY 120 mL 0   lamoTRIgine  (LAMICTAL ) 25 MG tablet One po every day x 5 days, then one po bid x 5 days, then one po tid x 5 days, then 2 po bid 120 tablet 5   midodrine  (PROAMATINE ) 2.5 MG tablet Take 1 tablet (2.5 mg total) by mouth 3 (three) times daily with meals. 90 tablet 0   nortriptyline  (PAMELOR ) 25 MG capsule TAKE 1 CAPSULE BY MOUTH AT BEDTIME. 90 capsule 1   omeprazole  (PRILOSEC) 40 MG capsule Take 40 mg by mouth daily as needed (acid reflux).     ondansetron  (ZOFRAN -ODT) 8 MG disintegrating tablet Dissolve 1 tablet (8 mg total) by mouth every 8 (eight) hours as needed for nausea or vomiting. 12 tablet 0   QUEtiapine  (SEROQUEL ) 100 MG tablet TAKE 1 TABLET BY MOUTH EVERYDAY AT BEDTIME 90 tablet 0   No current facility-administered medications for this visit.    REVIEW OF SYSTEMS:   Constitutional: ( - ) fevers, ( - )  chills , ( - ) night sweats Eyes: ( - ) blurriness of vision, ( - ) double vision, ( - ) watery eyes Ears, nose, mouth, throat, and face: ( - ) mucositis, ( - ) sore throat Respiratory: ( - ) cough, ( - ) dyspnea, ( - ) wheezes Cardiovascular: ( -  ) palpitation, ( - ) chest discomfort, ( - ) lower extremity swelling Gastrointestinal:  ( - ) nausea, ( - ) heartburn, ( - ) change in bowel habits Skin: ( - ) abnormal skin rashes Lymphatics: ( - ) new lymphadenopathy, ( - ) easy bruising Neurological: ( - ) numbness, ( - ) tingling, ( - ) new weaknesses Behavioral/Psych: ( - ) mood change, ( - ) new changes  All other systems were reviewed with the patient and are negative.  PHYSICAL EXAMINATION: ECOG PERFORMANCE STATUS: 1 - Symptomatic but completely ambulatory  Vitals:   01/21/24 1131 01/21/24 1133  BP: (!) 164/98 (!) 154/82  Pulse: 84   Resp: 17   Temp: 97.7 F (36.5 C)   SpO2: 100%     Filed Weights   01/21/24 1131  Weight: 113 lb 14.4 oz (51.7 kg)     GENERAL:thin appearing female in NAD  SKIN: skin color, texture, turgor are normal, no rashes or significant lesions EYES: conjunctiva are pink and non-injected, sclera clear OROPHARYNX: no exudate, no erythema; lips, buccal mucosa, and tongue normal  LUNGS: clear to auscultation and percussion with normal breathing effort HEART: regular rate & rhythm and no murmurs and no lower extremity edema Musculoskeletal: no cyanosis of digits and no clubbing  PSYCH: alert & oriented x 3, fluent speech NEURO: no focal motor/sensory deficits, EOM intact, no facial drooping.   LABORATORY DATA:  I have reviewed the data as listed    Latest Ref Rng & Units 01/21/2024   11:03 AM 12/20/2023    4:43 PM 12/09/2023    8:21 PM  CBC  WBC 4.0 - 10.5 K/uL 8.3  8.2  10.5   Hemoglobin 12.0 - 15.0 g/dL 85.8  88.2  86.1   Hematocrit 36.0 - 46.0 % 43.4  37.0  42.9   Platelets 150 - 400 K/uL 291  264  301        Latest Ref Rng & Units 01/21/2024   11:03 AM 12/20/2023    4:43 PM 12/09/2023    8:21 PM  CMP  Glucose  70 - 99 mg/dL 891  92  884   BUN 8 - 23 mg/dL 6  12  11    Creatinine 0.44 - 1.00 mg/dL 9.16  9.17  9.04   Sodium 135 - 145 mmol/L 143  140  140   Potassium 3.5 - 5.1 mmol/L 3.3   3.8  3.4   Chloride 98 - 111 mmol/L 107  107  105   CO2 22 - 32 mmol/L 29  25  24    Calcium  8.9 - 10.3 mg/dL 9.3  9.1  9.3   Total Protein 6.5 - 8.1 g/dL 6.6  5.8    Total Bilirubin 0.0 - 1.2 mg/dL 0.6  0.7    Alkaline Phos 38 - 126 U/L 81  67    AST 15 - 41 U/L 11  13    ALT 0 - 44 U/L 6  7      RADIOGRAPHIC STUDIES: I have personally reviewed the radiological images as listed and agreed with the findings in the report. No results found.  ASSESSMENT & PLAN [name]  is a 68 y.o. female who presents for follow-up for recent DVT and PE, concerning for antiphospholipid syndrome.  #Recent DVT and PE #Antiphospholipid Antibody Syndrome -- Currently on Lovenox  1 mg/kg subq twice daily --Tolerating anticoagulation without any prohibitive toxicities including bleeding.  --DOAC therapy is not appropriate in the setting of concern for antiphospholipid antibody syndrome --Hypercoagulable testing from 10/27/2021 showed beta-2  glycoprotein IgM elevated greater than 150 and anticardiolipin antibodies IgM elevated above 103.5.Levels remained elevated in Dec 2023, confirming the diagnosis of APS -- Labs today show WBC 8.3, Hgb 14.1, Plt 291, creatinine and LFTs in range.  --Continue with lovenox  therapy without any dose modifciations.  --Strict precautions for bleeding given. No signs/symptoms for recurrent VTE.  --RTC in 6 months with labs.   #Headaches: --Etiology unknown  but blood pressure is higher than her baseline --No focal deficits with normal neuro exam --Advised to take OTC analgesics and follow up with PCP.  --Advised to stay hydrated drinking 1-2 L of water  --CT head from 12/20/2023 showed no acute intracranial abnormality.   --Offered ED evaluation but patient declined.  --ED precautions given to patient including if patient develops pain, vision changes, stroke like symptoms and dizziness.   No orders of the defined types were placed in this encounter.  All questions  were answered. The patient knows to call the clinic with any problems, questions or concerns.  I have spent a total of 25 minutes minutes of face-to-face and non-face-to-face time, preparing to see the patient, performing a medically appropriate examination, counseling and educating the patient,  documenting clinical information in the electronic health record,  and care coordination.   Johnston Police PA-C Dept of Hematology and Oncology [facility] at [facility] Phone: [phone]

## 2024-01-21 ENCOUNTER — Inpatient Hospital Stay: Payer: Self-pay | Attending: Physician Assistant

## 2024-01-21 ENCOUNTER — Inpatient Hospital Stay (HOSPITAL_BASED_OUTPATIENT_CLINIC_OR_DEPARTMENT_OTHER): Payer: Self-pay | Admitting: Physician Assistant

## 2024-01-21 VITALS — BP 154/82 | HR 84 | Temp 97.7°F | Resp 17 | Ht 67.0 in | Wt 113.9 lb

## 2024-01-21 DIAGNOSIS — Z86718 Personal history of other venous thrombosis and embolism: Secondary | ICD-10-CM

## 2024-01-21 DIAGNOSIS — Z7901 Long term (current) use of anticoagulants: Secondary | ICD-10-CM | POA: Diagnosis not present

## 2024-01-21 DIAGNOSIS — F17210 Nicotine dependence, cigarettes, uncomplicated: Secondary | ICD-10-CM | POA: Diagnosis not present

## 2024-01-21 DIAGNOSIS — Z86711 Personal history of pulmonary embolism: Secondary | ICD-10-CM

## 2024-01-21 DIAGNOSIS — D6861 Antiphospholipid syndrome: Secondary | ICD-10-CM | POA: Diagnosis not present

## 2024-01-21 DIAGNOSIS — I2699 Other pulmonary embolism without acute cor pulmonale: Secondary | ICD-10-CM | POA: Insufficient documentation

## 2024-01-21 DIAGNOSIS — I82409 Acute embolism and thrombosis of unspecified deep veins of unspecified lower extremity: Secondary | ICD-10-CM | POA: Diagnosis present

## 2024-01-21 DIAGNOSIS — R519 Headache, unspecified: Secondary | ICD-10-CM

## 2024-01-21 LAB — CMP (CANCER CENTER ONLY)
ALT: 6 U/L (ref 0–44)
AST: 11 U/L — ABNORMAL LOW (ref 15–41)
Albumin: 4.3 g/dL (ref 3.5–5.0)
Alkaline Phosphatase: 81 U/L (ref 38–126)
Anion gap: 7 (ref 5–15)
BUN: 6 mg/dL — ABNORMAL LOW (ref 8–23)
CO2: 29 mmol/L (ref 22–32)
Calcium: 9.3 mg/dL (ref 8.9–10.3)
Chloride: 107 mmol/L (ref 98–111)
Creatinine: 0.83 mg/dL (ref 0.44–1.00)
GFR, Estimated: >60 mL/min (ref >60)
Glucose, Bld: 108 mg/dL — ABNORMAL HIGH (ref 70–99)
Potassium: 3.3 mmol/L — ABNORMAL LOW (ref 3.5–5.1)
Sodium: 143 mmol/L (ref 135–145)
Total Bilirubin: 0.6 mg/dL (ref 0.0–1.2)
Total Protein: 6.6 g/dL (ref 6.5–8.1)

## 2024-01-21 LAB — CBC WITH DIFFERENTIAL (CANCER CENTER ONLY)
Abs Immature Granulocytes: 0.02 K/uL (ref 0.00–0.07)
Basophils Absolute: 0.1 K/uL (ref 0.0–0.1)
Basophils Relative: 1 %
Eosinophils Absolute: 0.2 K/uL (ref 0.0–0.5)
Eosinophils Relative: 2 %
HCT: 43.4 % (ref 36.0–46.0)
Hemoglobin: 14.1 g/dL (ref 12.0–15.0)
Immature Granulocytes: 0 %
Lymphocytes Relative: 30 %
Lymphs Abs: 2.5 K/uL (ref 0.7–4.0)
MCH: 28 pg (ref 26.0–34.0)
MCHC: 32.5 g/dL (ref 30.0–36.0)
MCV: 86.3 fL (ref 80.0–100.0)
Monocytes Absolute: 0.6 K/uL (ref 0.1–1.0)
Monocytes Relative: 7 %
Neutro Abs: 5 K/uL (ref 1.7–7.7)
Neutrophils Relative %: 60 %
Platelet Count: 291 K/uL (ref 150–400)
RBC: 5.03 MIL/uL (ref 3.87–5.11)
RDW: 15.1 % (ref 11.5–15.5)
WBC Count: 8.3 K/uL (ref 4.0–10.5)
nRBC: 0 % (ref 0.0–0.2)

## 2024-01-22 ENCOUNTER — Emergency Department (HOSPITAL_BASED_OUTPATIENT_CLINIC_OR_DEPARTMENT_OTHER)

## 2024-01-22 ENCOUNTER — Other Ambulatory Visit: Payer: Self-pay

## 2024-01-22 ENCOUNTER — Emergency Department (HOSPITAL_BASED_OUTPATIENT_CLINIC_OR_DEPARTMENT_OTHER)
Admission: EM | Admit: 2024-01-22 | Discharge: 2024-01-22 | Disposition: A | Discharge status: Home / Self Care | Attending: Emergency Medicine | Admitting: Emergency Medicine

## 2024-01-22 DIAGNOSIS — R519 Headache, unspecified: Secondary | ICD-10-CM | POA: Insufficient documentation

## 2024-01-22 LAB — CBC WITH DIFFERENTIAL/PLATELET
Abs Immature Granulocytes: 0.01 K/uL (ref 0.00–0.07)
Basophils Absolute: 0.1 K/uL (ref 0.0–0.1)
Basophils Relative: 1 %
Eosinophils Absolute: 0.1 K/uL (ref 0.0–0.5)
Eosinophils Relative: 1 %
HCT: 38.7 % (ref 36.0–46.0)
Hemoglobin: 12.8 g/dL (ref 12.0–15.0)
Immature Granulocytes: 0 %
Lymphocytes Relative: 39 %
Lymphs Abs: 2.4 K/uL (ref 0.7–4.0)
MCH: 29 pg (ref 26.0–34.0)
MCHC: 33.1 g/dL (ref 30.0–36.0)
MCV: 87.6 fL (ref 80.0–100.0)
Monocytes Absolute: 0.5 K/uL (ref 0.1–1.0)
Monocytes Relative: 7 %
Neutro Abs: 3.2 K/uL (ref 1.7–7.7)
Neutrophils Relative %: 52 %
Platelets: 253 K/uL (ref 150–400)
RBC: 4.42 MIL/uL (ref 3.87–5.11)
RDW: 14.9 % (ref 11.5–15.5)
WBC: 6.3 K/uL (ref 4.0–10.5)
nRBC: 0 % (ref 0.0–0.2)

## 2024-01-22 LAB — COMPREHENSIVE METABOLIC PANEL WITH GFR
ALT: 5 U/L (ref 0–44)
AST: 14 U/L — ABNORMAL LOW (ref 15–41)
Albumin: 4.1 g/dL (ref 3.5–5.0)
Alkaline Phosphatase: 84 U/L (ref 38–126)
Anion gap: 8 (ref 5–15)
BUN: 10 mg/dL (ref 8–23)
CO2: 29 mmol/L (ref 22–32)
Calcium: 9.5 mg/dL (ref 8.9–10.3)
Chloride: 106 mmol/L (ref 98–111)
Creatinine, Ser: 0.74 mg/dL (ref 0.44–1.00)
GFR, Estimated: >60 mL/min (ref >60)
Glucose, Bld: 89 mg/dL (ref 70–99)
Potassium: 3.5 mmol/L (ref 3.5–5.1)
Sodium: 142 mmol/L (ref 135–145)
Total Bilirubin: 0.5 mg/dL (ref 0.0–1.2)
Total Protein: 6.1 g/dL — ABNORMAL LOW (ref 6.5–8.1)

## 2024-01-22 MED ORDER — DIPHENHYDRAMINE HCL 50 MG/ML IJ SOLN
12.5000 mg | Freq: Once | INTRAMUSCULAR | Status: AC
  Administered 2024-01-22: 12.5 mg via INTRAVENOUS
  Filled 2024-01-22: qty 1

## 2024-01-22 MED ORDER — IOHEXOL 300 MG/ML  SOLN
100.0000 mL | Freq: Once | INTRAMUSCULAR | Status: AC | PRN
  Administered 2024-01-22: 75 mL via INTRAVENOUS

## 2024-01-22 MED ORDER — PROCHLORPERAZINE EDISYLATE 10 MG/2ML IJ SOLN
5.0000 mg | Freq: Once | INTRAMUSCULAR | Status: AC
  Administered 2024-01-22: 5 mg via INTRAVENOUS
  Filled 2024-01-22: qty 2

## 2024-01-22 MED ORDER — DEXAMETHASONE SOD PHOSPHATE PF 10 MG/ML IJ SOLN
10.0000 mg | Freq: Once | INTRAMUSCULAR | Status: AC
Start: 2024-01-22 — End: 2024-01-22
  Administered 2024-01-22: 10 mg via INTRAVENOUS

## 2024-01-22 NOTE — Discharge Instructions (Signed)
 It was overall reassuring.  I would follow-up with neurology for further workup for your new onset headache.  You can also follow-up with primary care.  Return to emergency room with new or worsening symptoms.

## 2024-01-22 NOTE — ED Provider Notes (Signed)
 [place] EMERGENCY DEPARTMENT AT [facility] Provider Note   CSN: [id number] Arrival date & time: 01/22/24  1315     Patient presents with: Headache   [name] is a 68 y.o. 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  This is new onset headache.  She describes nagging sensation in forehead rates pain 5 out of 10.  She denies any injury trauma or fall.  She is not having vision changes, confusion or trouble with speech.  No numbness or tingling in extremities.  Denies a steady gait. Reports last time that she had venous clot she had a seizure.     Headache      Prior to Admission medications   Medication Sig Start Date End Date Taking? Authorizing Provider  clonazePAM  (KLONOPIN ) 0.5 MG tablet Take 1 tablet (0.5 mg total) by mouth 2 (two) times daily. 07/24/23   Sater, Charlie DELENA, MD  desvenlafaxine (PRISTIQ) 25 MG 24 hr tablet Take 25 mg by mouth daily. 09/19/23   [provider]  donepezil  (ARICEPT ) 5 MG tablet Take 5 mg by mouth daily. 03/01/22   [provider]  enoxaparin  (LOVENOX ) 60 MG/0.6ML injection INJECT 0.5 MLS (50 MG TOTAL) INTO THE SKIN 2 (TWO) TIMES DAILY 01/15/24 05/14/24  Dorsey, John T IV, MD  lamoTRIgine  (LAMICTAL ) 25 MG tablet One po every day x 5 days, then one po bid x 5 days, then one po tid x 5 days, then 2 po bid 11/19/23   Sater, Charlie DELENA, MD  midodrine  (PROAMATINE ) 2.5 MG tablet Take 1 tablet (2.5 mg total) by mouth 3 (three) times daily with meals. 02/23/22   Leotis Bogus, MD  nortriptyline  (PAMELOR ) 25 MG capsule TAKE 1 CAPSULE BY MOUTH AT BEDTIME. 12/13/23   Sater, Charlie DELENA, MD  omeprazole  (PRILOSEC) 40 MG capsule Take 40 mg by mouth daily as needed (acid reflux).    [provider]  ondansetron  (ZOFRAN -ODT) 8 MG disintegrating tablet Dissolve 1 tablet (8 mg total) by mouth every 8 (eight) hours as needed for nausea or vomiting.  12/20/23   Randol Simmonds, MD  QUEtiapine  (SEROQUEL ) 100 MG tablet TAKE 1 TABLET BY MOUTH EVERYDAY AT BEDTIME 09/26/23   Sater, Charlie DELENA, MD    Allergies: Penicillins    Review of Systems  Neurological:  Positive for headaches.    Updated Vital Signs BP (!) 171/96   Pulse 70   Temp 98 F (36.7 C) (Oral)   Resp 18   SpO2 98%   Physical Exam Vitals and nursing note reviewed.  Constitutional:      General: She is not in acute distress.    Appearance: She is not toxic-appearing.  HENT:     Head: Normocephalic and atraumatic.  Eyes:     General: No scleral icterus.    Conjunctiva/sclera: Conjunctivae normal.  Cardiovascular:     Rate and Rhythm: Normal rate and regular rhythm.     Pulses: Normal pulses.     Heart sounds: Normal heart sounds.  Pulmonary:     Effort: Pulmonary effort is normal. No respiratory distress.     Breath sounds: Normal breath sounds.  Abdominal:     General: Abdomen is flat. Bowel sounds are normal.     Palpations: Abdomen is soft.     Tenderness: There is no abdominal tenderness.  Skin:    General: Skin is warm and dry.     Findings: No  lesion.  Neurological:     General: No focal deficit present.     Mental Status: She is alert and oriented to person, place, and time. Mental status is at baseline.     GCS: GCS eye subscore is 4. GCS verbal subscore is 5. GCS motor subscore is 6.     Comments: Pupils equal and reactive, no nystagmus, no slurred speech.  No facial droop, facial sensation intact. Upper extremity sensation and strength intact.  Strong radial pulse.  No ataxia with finger-to-nose.  Lower extremity sensation and strength intact.  No ataxia with heel to shin.     (all labs ordered are listed, but only abnormal results are displayed) Labs Reviewed  COMPREHENSIVE METABOLIC PANEL WITH GFR - Abnormal; Notable for the following components:      Result Value   Total Protein 6.1 (*)    AST 14 (*)    All other components within normal limits   CBC WITH DIFFERENTIAL/PLATELET    EKG: None  Radiology: CT VENOGRAM HEAD Result Date: 01/22/2024 EXAM: CT VENOGRAM WITH CONTRAST 01/22/2024 03:17:21 PM TECHNIQUE: CT venogram of the head/brain was performed with the administration of 75 mL of iohexol  (OMNIPAQUE ) 300 MG/ML solution. Multiplanar reformatted images are provided for review. MIP images are provided for review. Automated exposure control, iterative reconstruction, and/or weight based adjustment of the mA/kV was utilized to reduce the radiation dose to as low as reasonably achievable. COMPARISON: CT head without contrast 12/20/2023. CLINICAL HISTORY: Dural venous sinus thrombosis suspected. FINDINGS: BRAIN/VENTRICLES: No acute intracranial hemorrhage. No extra axial fluid collection. Gray-white differentiation is maintained. No mass effect or midline shift. No hydrocephalus. Moderate atrophy and white matter changes are stable, likely within normal limits for age. ORBITS: Bilateral lens replacements are noted. The globes and orbits are otherwise within normal limits. SINUSES AND MASTOIDS: No acute abnormality. SOFT TISSUES AND SKULL: No acute abnormality. CT VENOGRAM: No dural venous sinus thrombosis. No significant stenosis. The right transverse sinus is dominant. IMPRESSION: 1. No dural venous sinus thrombosis. Electronically signed by: Lonni Necessary MD 01/22/2024 03:43 PM EST RP Workstation: [id number]     Procedures   Medications Ordered in the ED  dexamethasone  (DECADRON ) injection 10 mg (10 mg Intravenous Given 01/22/24 1411)  prochlorperazine (COMPAZINE) injection 5 mg (5 mg Intravenous Given 01/22/24 1412)  diphenhydrAMINE  (BENADRYL ) injection 12.5 mg (12.5 mg Intravenous Given 01/22/24 1412)  iohexol  (OMNIPAQUE ) 300 MG/ML solution 100 mL (75 mLs Intravenous Contrast Given 01/22/24 1507)    Clinical Course as of 01/22/24 1651  Wed Jan 22, 2024  1637 Headache significantly improved. See [facility], will follow up.   [JB]    Clinical Course User Index [JB] Melissa Pulido, Warren SAILOR, PA-C                                 Medical Decision Making Amount and/or Complexity of Data Reviewed Labs: ordered. Radiology: ordered.  Risk Prescription drug management.   This patient presents to the ED for concern of headache, this involves an extensive number of treatment options, and is a complaint that carries with it a high risk of complications and morbidity.  The differential diagnosis includes tension headache, migraine, intracranial mass, intracranial hemorrhage, intracranial infection including meningitis vs encephalitis, trigeminal neuralgia, AVM, sinusitis, cerebral aneurysm, muscular headache, cavernous sinus thrombosis, carotid artery dissection.   Lab Tests:  I personally interpreted labs.  The pertinent results include:   No leukocytosis, no anemia  Imaging Studies ordered:  I ordered imaging studies including head CT venogram  I independently visualized and interpreted imaging which showed no acute findings.  I agree with the radiologist interpretation   Problem List / ED Course / Critical interventions / Medication management  Patient presents with complaint of headache.  This has been ongoing for 2 weeks and this is new onset headache.  On my exam she has no focal neurological deficits she is hemodynamically stable and well-appearing.  No signs of meningitis or encephalitis.  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  She reports she has strict compliance with Lovenox  and has not missed any doses.  Given her history of blood clot I will obtain a CT venogram to rule out venous sinus thrombosis or other acute abnormality.  Will also check basic labs. I did review patient's office visit today as well as yesterday.  I did review patient's last CT imaging of the head which was 12/20/2023 with no acute findings. I ordered medication including migraine cocktail given for  headache  Reevaluation of the patient after these medicines showed that the patient improved I have reviewed the patients home medicines and have made adjustments as needed. Patient's lab work and imaging overall reassuring.  Feeling much better after treatment here.  Hemodynamically stable and well-appearing.  Feel appropriate for discharge with outpatient follow-up.  He is established with neurology.        Final diagnoses:  Bad headache    ED Discharge Orders          Ordered    Ambulatory referral to Neurology       Comments: An appointment is requested in approximately: 2 weeks   01/22/24 1631               Kean Gautreau, Warren SAILOR, PA-C 01/22/24 1653    Kingsley, Victoria K, DO 01/23/24 [phone]

## 2024-01-22 NOTE — ED Triage Notes (Signed)
 Patient states Theres a clot in my head, a clot in my chest, and a clot in my leg. My clot doctor sent me here because I had a headache for 2 weeks. Hx of DVT in chart.

## 2024-01-22 NOTE — ED Notes (Signed)
 Reviewed discharge instructions and follow-up care with pt. Pt verbalized understanding and had no further questions. Pt exited ED without complications.

## 2024-01-26 ENCOUNTER — Other Ambulatory Visit: Payer: Self-pay

## 2024-01-26 ENCOUNTER — Telehealth: Admitting: Physician Assistant

## 2024-01-26 ENCOUNTER — Emergency Department (HOSPITAL_BASED_OUTPATIENT_CLINIC_OR_DEPARTMENT_OTHER)
Admission: EM | Admit: 2024-01-26 | Discharge: 2024-01-26 | Disposition: A | Discharge status: Home / Self Care | Attending: Emergency Medicine | Admitting: Emergency Medicine

## 2024-01-26 ENCOUNTER — Emergency Department (HOSPITAL_BASED_OUTPATIENT_CLINIC_OR_DEPARTMENT_OTHER)

## 2024-01-26 DIAGNOSIS — R300 Dysuria: Secondary | ICD-10-CM

## 2024-01-26 DIAGNOSIS — R109 Unspecified abdominal pain: Secondary | ICD-10-CM | POA: Diagnosis present

## 2024-01-26 DIAGNOSIS — R42 Dizziness and giddiness: Secondary | ICD-10-CM | POA: Diagnosis not present

## 2024-01-26 DIAGNOSIS — N200 Calculus of kidney: Secondary | ICD-10-CM

## 2024-01-26 DIAGNOSIS — Z950 Presence of cardiac pacemaker: Secondary | ICD-10-CM | POA: Diagnosis not present

## 2024-01-26 DIAGNOSIS — R350 Frequency of micturition: Secondary | ICD-10-CM | POA: Diagnosis not present

## 2024-01-26 DIAGNOSIS — M549 Dorsalgia, unspecified: Secondary | ICD-10-CM

## 2024-01-26 DIAGNOSIS — N390 Urinary tract infection, site not specified: Secondary | ICD-10-CM

## 2024-01-26 DIAGNOSIS — R000 Tachycardia, unspecified: Secondary | ICD-10-CM | POA: Insufficient documentation

## 2024-01-26 DIAGNOSIS — Z79899 Other long term (current) drug therapy: Secondary | ICD-10-CM | POA: Diagnosis not present

## 2024-01-26 LAB — BASIC METABOLIC PANEL WITH GFR
Anion gap: 9 (ref 5–15)
BUN: 10 mg/dL (ref 8–23)
CO2: 27 mmol/L (ref 22–32)
Calcium: 9.3 mg/dL (ref 8.9–10.3)
Chloride: 105 mmol/L (ref 98–111)
Creatinine, Ser: 0.92 mg/dL (ref 0.44–1.00)
GFR, Estimated: >60 mL/min (ref >60)
Glucose, Bld: 100 mg/dL — ABNORMAL HIGH (ref 70–99)
Potassium: 4.4 mmol/L (ref 3.5–5.1)
Sodium: 140 mmol/L (ref 135–145)

## 2024-01-26 LAB — URINALYSIS, ROUTINE W REFLEX MICROSCOPIC
Bilirubin Urine: NEGATIVE
Glucose, UA: NEGATIVE mg/dL
Ketones, ur: NEGATIVE mg/dL
Nitrite: POSITIVE — AB
Specific Gravity, Urine: 1.011 (ref 1.005–1.030)
WBC, UA: >50 WBC/hpf (ref 0–5)
pH: 6.5 (ref 5.0–8.0)

## 2024-01-26 LAB — PROTIME-INR
INR: 0.9 (ref 0.8–1.2)
Prothrombin Time: 13.1 s (ref 11.4–15.2)

## 2024-01-26 LAB — CBC WITH DIFFERENTIAL/PLATELET
Abs Immature Granulocytes: 0.03 K/uL (ref 0.00–0.07)
Basophils Absolute: 0.1 K/uL (ref 0.0–0.1)
Basophils Relative: 1 %
Eosinophils Absolute: 0.1 K/uL (ref 0.0–0.5)
Eosinophils Relative: 1 %
HCT: 42.6 % (ref 36.0–46.0)
Hemoglobin: 13.7 g/dL (ref 12.0–15.0)
Immature Granulocytes: 0 %
Lymphocytes Relative: 30 %
Lymphs Abs: 3.1 K/uL (ref 0.7–4.0)
MCH: 28.1 pg (ref 26.0–34.0)
MCHC: 32.2 g/dL (ref 30.0–36.0)
MCV: 87.5 fL (ref 80.0–100.0)
Monocytes Absolute: 0.6 K/uL (ref 0.1–1.0)
Monocytes Relative: 6 %
Neutro Abs: 6.5 K/uL (ref 1.7–7.7)
Neutrophils Relative %: 62 %
Platelets: 271 K/uL (ref 150–400)
RBC: 4.87 MIL/uL (ref 3.87–5.11)
RDW: 15 % (ref 11.5–15.5)
WBC: 10.3 K/uL (ref 4.0–10.5)
nRBC: 0 % (ref 0.0–0.2)

## 2024-01-26 LAB — APTT: aPTT: 38 s — ABNORMAL HIGH (ref 24–36)

## 2024-01-26 MED ORDER — OXYCODONE-ACETAMINOPHEN 5-325 MG PO TABS
2.0000 | ORAL_TABLET | Freq: Once | ORAL | Status: DC
  Filled 2024-01-26: qty 2

## 2024-01-26 MED ORDER — SULFAMETHOXAZOLE-TRIMETHOPRIM 800-160 MG PO TABS
1.0000 | ORAL_TABLET | Freq: Once | ORAL | Status: AC
  Administered 2024-01-26: 1 via ORAL
  Filled 2024-01-26: qty 1

## 2024-01-26 MED ORDER — MORPHINE SULFATE (PF) 2 MG/ML IV SOLN
2.0000 mg | Freq: Once | INTRAVENOUS | Status: AC
  Administered 2024-01-26: 2 mg via INTRAVENOUS
  Filled 2024-01-26: qty 1

## 2024-01-26 MED ORDER — KETOROLAC TROMETHAMINE 15 MG/ML IJ SOLN
15.0000 mg | Freq: Once | INTRAMUSCULAR | Status: DC
  Filled 2024-01-26: qty 1

## 2024-01-26 MED ORDER — KETOROLAC TROMETHAMINE 15 MG/ML IJ SOLN
15.0000 mg | Freq: Once | INTRAMUSCULAR | Status: AC
  Administered 2024-01-26: 15 mg via INTRAVENOUS
  Filled 2024-01-26: qty 1

## 2024-01-26 MED ORDER — SULFAMETHOXAZOLE-TRIMETHOPRIM 800-160 MG PO TABS
1.0000 | ORAL_TABLET | Freq: Two times a day (BID) | ORAL | 0 refills | Status: AC
Start: 2024-01-26 — End: 2024-02-05

## 2024-01-26 NOTE — ED Provider Notes (Signed)
 [place] EMERGENCY DEPARTMENT AT [facility] Provider Note   CSN: [id number] Arrival date & time: 01/26/24  1544     Patient presents with: Flank Pain   [name] is a 68 y.o. female.  69 year old female presents ED with complaints of dysuria, increased urinary frequency, and right flank pain.  Patient advises that she has had the flank pain for 3 days and the urinary symptoms just today.  Patient reports extensive history of recurrent UTIs and a known kidney stone.  Patient also endorses being lightheaded today while she was up walking around shopping with her mother.  Patient denies any syncope, denies, visual disturbances, shortness of breath, chest pain, headache, or weakness.  Patient has significant history of pacemaker, DVT, pulmonary embolism.  She is on Lovenox  for prevention.     Prior to Admission medications   Medication Sig Start Date End Date Taking? Authorizing Provider  clonazePAM  (KLONOPIN ) 0.5 MG tablet Take 1 tablet (0.5 mg total) by mouth 2 (two) times daily. 07/24/23   Sater, Charlie DELENA, MD  desvenlafaxine (PRISTIQ) 25 MG 24 hr tablet Take 25 mg by mouth daily. 09/19/23   [provider]  donepezil  (ARICEPT ) 5 MG tablet Take 5 mg by mouth daily. 03/01/22   [provider]  enoxaparin  (LOVENOX ) 60 MG/0.6ML injection INJECT 0.5 MLS (50 MG TOTAL) INTO THE SKIN 2 (TWO) TIMES DAILY 01/15/24 05/14/24  Dorsey, John T IV, MD  lamoTRIgine  (LAMICTAL ) 25 MG tablet One po every day x 5 days, then one po bid x 5 days, then one po tid x 5 days, then 2 po bid 11/19/23   Sater, Charlie DELENA, MD  midodrine  (PROAMATINE ) 2.5 MG tablet Take 1 tablet (2.5 mg total) by mouth 3 (three) times daily with meals. 02/23/22   Leotis Bogus, MD  nortriptyline  (PAMELOR ) 25 MG capsule TAKE 1 CAPSULE BY MOUTH AT BEDTIME. 12/13/23   Sater, Charlie DELENA, MD  omeprazole  (PRILOSEC) 40 MG capsule Take 40 mg by mouth daily as needed (acid reflux).    [provider]   ondansetron  (ZOFRAN -ODT) 8 MG disintegrating tablet Dissolve 1 tablet (8 mg total) by mouth every 8 (eight) hours as needed for nausea or vomiting. 12/20/23   Randol Simmonds, MD  QUEtiapine  (SEROQUEL ) 100 MG tablet TAKE 1 TABLET BY MOUTH EVERYDAY AT BEDTIME 09/26/23   Sater, Charlie DELENA, MD    Allergies: Penicillins    Review of Systems  Genitourinary:  Positive for dysuria, flank pain and urgency.  Neurological:  Positive for light-headedness.  All other systems reviewed and are negative.   Updated Vital Signs BP (!) 149/95   Pulse 71   Temp (!) 97.5 F (36.4 C) (Oral)   Resp 14   Ht 5' 7 (1.702 m)   Wt 51.7 kg   SpO2 97%   BMI 17.85 kg/m   Physical Exam Vitals and nursing note reviewed.  Constitutional:      Appearance: Normal appearance.  HENT:     Head: Normocephalic and atraumatic.     Nose: Nose normal.  Eyes:     Extraocular Movements: Extraocular movements intact.     Conjunctiva/sclera: Conjunctivae normal.     Pupils: Pupils are equal, round, and reactive to light.  Cardiovascular:     Rate and Rhythm: Normal rate and regular rhythm.  Pulmonary:     Effort: Pulmonary effort is normal. No respiratory distress.     Breath sounds: Normal breath sounds.  Abdominal:     General: Abdomen is flat.  There is no distension.     Palpations: Abdomen is soft.     Tenderness: There is no abdominal tenderness. There is right CVA tenderness. There is no left CVA tenderness or guarding. Negative signs include Murphy's sign.  Musculoskeletal:        General: Normal range of motion.     Cervical back: Normal range of motion.     Right lower leg: No edema.     Left lower leg: No edema.     Comments: No lower extremity edema noted.  No warmth or erythema noted.  Skin:    General: Skin is warm.     Capillary Refill: Capillary refill takes less than 2 seconds.  Neurological:     General: No focal deficit present.     Mental Status: She is alert.     Cranial Nerves: Cranial  nerves 2-12 are intact.     Motor: No weakness.  Psychiatric:        Mood and Affect: Mood normal.        Behavior: Behavior normal.     (all labs ordered are listed, but only abnormal results are displayed) Labs Reviewed  BASIC METABOLIC PANEL WITH GFR - Abnormal; Notable for the following components:      Result Value   Glucose, Bld 100 (*)    All other components within normal limits  APTT - Abnormal; Notable for the following components:   aPTT 38 (*)    All other components within normal limits  CBC WITH DIFFERENTIAL/PLATELET  PROTIME-INR  URINALYSIS, ROUTINE W REFLEX MICROSCOPIC    EKG: EKG Interpretation Date/Time:  Sunday January 26 2024 16:50:46 EST Ventricular Rate:  73 PR Interval:  140 QRS Duration:  82 QT Interval:  423 QTC Calculation: 467 R Axis:   28  Text Interpretation: Sinus rhythm Probable left atrial enlargement Low voltage, precordial leads No acute changes No significant change since last tracing Confirmed by Charlyn Sora ([id number]) on 01/26/2024 6:03:30 PM  Radiology: CT Renal Stone Study Result Date: 01/26/2024 EXAM: CT UROGRAM 01/26/2024 05:12:16 PM TECHNIQUE: CT of the abdomen and pelvis was performed without the administration of intravenous contrast as per CT urogram protocol. Multiplanar reformatted images as well as MIP urogram images are provided for review. Automated exposure control, iterative reconstruction, and/or weight based adjustment of the mA/kV was utilized to reduce the radiation dose to as low as reasonably achievable. COMPARISON: None available. CLINICAL HISTORY: Abdominal/flank pain, stone suspected. FINDINGS: LOWER CHEST: No acute abnormality. LIVER: The liver is unremarkable. GALLBLADDER AND BILE DUCTS: Cholelithiasis without evidence of acute cholecystitis. No biliary ductal dilatation. SPLEEN: No acute abnormality. PANCREAS: No acute abnormality. ADRENAL GLANDS: No acute abnormality. KIDNEYS, URETERS AND BLADDER: Tiny  nonobstructing left nephrolithiasis. No hydronephrosis. No perinephric or periureteral stranding. Urinary bladder is unremarkable. GI AND BOWEL: Normal appendix. Stomach demonstrates no acute abnormality. There is no bowel obstruction. PERITONEUM AND RETROPERITONEUM: No ascites. No free air. VASCULATURE: Aortic atherosclerotic calcification. Aorta is normal in caliber. LYMPH NODES: No lymphadenopathy. REPRODUCTIVE ORGANS: No acute abnormality. BONES AND SOFT TISSUES: No acute osseous abnormality. Posterior effusion at L4-L5. IMPRESSION: 1. No acute abnormality. 2. Tiny nonobstructing left nephrolithiasis. 3. Cholelithiasis without evidence of acute cholecystitis. Electronically signed by: Norman Gatlin MD 01/26/2024 05:37 PM EST RP Workstation: [id number]     Procedures   Medications Ordered in the ED  ketorolac  (TORADOL ) 15 MG/ML injection 15 mg (15 mg Intravenous Given 01/26/24 1731)  morphine  (PF) 2 MG/ML injection 2 mg (2 mg Intravenous Given  01/26/24 1731)    68 y.o. female presents to the ED with complaints of dysuria, right flank pain, lightheadedness, this involves an extensive number of treatment options, and is a complaint that carries with it a high risk of complications and morbidity.  The differential diagnosis includes UTI, pyelonephritis, hydronephrosis, ureterolithiasis, nephrolithiasis, musculoskeletal pain, shingles, anemia, dehydration, (Ddx)  On arrival pt is nontoxic, vitals are significant for tachycardia on initial exam.. Exam significant for right CVA tenderness.  Additional history obtained from significant for patient being seen by neurology for antiphospholipid syndrome.  Patient is prescribed Lovenox .  She is also prescribed clonazepam  and lamotrigine  for leg pain and tremor/anxiety.  I ordered medication morphine  and Toradol  for pain  Lab Tests:  I Ordered, reviewed, and interpreted labs, which included: BMP, CBC, UA, PT/INR, APTT.  No significant abnormalities on BMP  CBC PT/INR APTT.  Imaging Studies ordered:  I ordered imaging studies which included CT renal, which showed no acute abnormalities.  Did find incidental left nonobstructing nephrolithiasis and cholelithiasis without signs of acute cholecystitis.  Patient has negative Murphy sign.  ED Course:   Patient sitting comfortably in ED bed in no acute distress nontoxic-appearing on initial exam.  Patient reports significant history of UTIs.  She reports she is very confident that she has 1 but she believes she also has a kidney stone causing pain in her right flank.  No signs of unilateral leg swelling.  Patient denies any shortness of breath or chest pain.  On reevaluation patient is still unable to give urine sample and is drinking water  and ginger ale at bedside.  Patient denies any pain and reports she feels comfortable at this time.  Patient still unable to give urine sample on reassessment.  Patient still sitting comfortably in ED bed watching TV.   At time of shift change patient care was transferred to [facility] pending urine analysis..  Portions of this note were generated with Scientist, clinical (histocompatibility and immunogenetics). Dictation errors may occur despite best attempts at proofreading.   Final diagnoses:  None    ED Discharge Orders     None          Myriam Fonda GORMAN DEVONNA 01/26/24 ROANN Charlyn Sora, MD 01/26/24 [phone]

## 2024-01-26 NOTE — ED Notes (Signed)
 Attempted to urinate, but was unable to.

## 2024-01-26 NOTE — ED Notes (Signed)
Cannot provide UA at this time.

## 2024-01-26 NOTE — Discharge Instructions (Addendum)
 Follow these instructions at home:  Medicines  Take your medicines only as told by your health care provider.  If you were given antibiotics, take them as told by your provider. Do not stop taking them even if you start to feel better.    General instructions  Make sure you:  Pee often and fully. Do not hold your pee for a long time.  Wipe from front to back after you pee or poop. Use each tissue only once when you wipe.  Pee after you have sex.    Do not douche or use sprays or powders in your genital area.    Contact a health care provider if:  Your symptoms don't get better after 1-2 days of taking antibiotics.  Your symptoms go away and then come back.  You have a fever or chills.  You vomit or feel like you may vomit.    Get help right away if:  You have very bad pain in your back or lower belly.  You faint.

## 2024-01-26 NOTE — ED Triage Notes (Signed)
 Pt caox4 c/o R flank pain x2 days, urinary frequency and dysuria today. PMH UTI and kidney stones. Denies fever.

## 2024-01-26 NOTE — ED Provider Notes (Signed)
 Here for flank pain. No stone + dysuria. Waiting for UA  Physical Exam  BP (!) 149/95   Pulse 71   Temp (!) 97.5 F (36.4 C) (Oral)   Resp 14   Ht 5' 7 (1.702 m)   Wt 51.7 kg   SpO2 97%   BMI 17.85 kg/m   Physical Exam  Procedures  Procedures  ED Course / MDM    Medical Decision Making Amount and/or Complexity of Data Reviewed Labs: ordered. Radiology: ordered.  Risk Prescription drug management.   UA + for infection Tx w/ bactrim  per review of previous Urine cultures  Discussed return precautions         Arloa Chroman, PA-C 02/03/24 1303    Charlyn Sora, MD 02/07/24 [phone]

## 2024-01-26 NOTE — Progress Notes (Signed)
 Based on what you shared with me, I feel your condition warrants further evaluation as soon as possible at an Emergency department.    NOTE: There will be NO CHARGE for this eVisit   If you are having a true medical emergency please call 911.      Emergency Department-[facility]  Get Driving Directions  [phone]  [address]  [place], [place] [address]  Open 24/7/365      [facility] Emergency Department at [facility]  Get Driving Directions  [address]  [place], [place] [address]  Open 24/7/365    Emergency Department- [facility]  Get Driving Directions  [phone]  [address]  [place], [place] [address]  Open 24/7/365      [facility] at [facility]  Get Driving Directions  [phone]  [address]  [place], [place] [address]  Open 24/7/365    [facility]  Emergency Department- [facility]  Get Driving Directions  [phone]  [address]  [place], [place] [address]  Open 24/7/365    [place]  Emergency Department- [facility]  Get Driving Directions  [address]  [place], [place] [address]  Open 24/7/365    [facility]  Emergency Department- [facility]  Get Driving Directions  [phone]  [address]  [place], [place] [address]  Open 24/7/365

## 2024-01-28 ENCOUNTER — Ambulatory Visit: Admitting: Neurology

## 2024-02-05 ENCOUNTER — Telehealth: Payer: Self-pay

## 2024-02-05 NOTE — Telephone Encounter (Signed)
 Pt LM requesting a call back to discuss taking on oral anticoagulant vs the Lovenox  injections.

## 2024-02-10 NOTE — Telephone Encounter (Signed)
 just let patient know that only PO option is warfarin which requires blood testing to make sure INR is in range   Pt advised of the above recommendations but declined and will continue with Lovenox 

## 2024-02-27 ENCOUNTER — Other Ambulatory Visit: Payer: Self-pay | Admitting: Neurology

## 2024-03-03 NOTE — Telephone Encounter (Signed)
 Pt called stating that the pharmacy informed her that she is needing a f/u but the pt is already scheduled for one. Pt states that her daughter just passed away and she is needing her medications so that she is able to sleep. Please advise.

## 2024-03-04 NOTE — Telephone Encounter (Signed)
 Requested Prescriptions   Pending Prescriptions Disp Refills   QUEtiapine  (SEROQUEL ) 100 MG tablet [Pharmacy Med Name: QUETIAPINE  FUMARATE 100 MG TAB] 90 tablet 0    Sig: TAKE 1 TABLET BY MOUTH EVERYDAY AT BEDTIME   clonazePAM  (KLONOPIN ) 0.5 MG tablet [Pharmacy Med Name: CLONAZEPAM  0.5 MG TABLET] 60 tablet     Sig: TAKE 1 TABLET BY MOUTH 2 TIMES DAILY.   Last seen 11/19/23 Next appt 05/19/24 Dispenses   Dispensed Days Supply Quantity Provider Pharmacy  QUETIAPINE  FUMARATE 100 MG TAB 12/20/2023 90 90 each Vear Charlie LABOR, MD [facility] [phone] [facility] - ...  QUETIAPINE  FUMARATE 100 MG TAB 09/19/2023 90 90 each Sater, Charlie LABOR, MD [facility] [phone] [facility] - ...  QUETIAPINE  FUMARATE 100 MG TAB 06/30/2023 90 90 each Vear Charlie LABOR, MD [facility] [phone] - [facility]

## 2024-03-06 ENCOUNTER — Ambulatory Visit: Admitting: Physician Assistant

## 2024-03-06 ENCOUNTER — Other Ambulatory Visit

## 2024-04-10 ENCOUNTER — Other Ambulatory Visit: Payer: Self-pay | Admitting: Hematology and Oncology

## 2024-05-19 ENCOUNTER — Institutional Professional Consult (permissible substitution): Admitting: Neurology

## 2024-06-17 ENCOUNTER — Ambulatory Visit

## 2024-06-24 ENCOUNTER — Ambulatory Visit: Admitting: Neurology

## 2024-07-22 ENCOUNTER — Inpatient Hospital Stay: Attending: Physician Assistant

## 2024-07-22 ENCOUNTER — Inpatient Hospital Stay: Admitting: Physician Assistant

## 2024-09-16 ENCOUNTER — Ambulatory Visit

## 2025-06-17 ENCOUNTER — Ambulatory Visit

## 2025-09-16 ENCOUNTER — Ambulatory Visit
